# Patient Record
Sex: Female | Born: 1935
Health system: Southern US, Community
[De-identification: ages and names within clinical notes are randomized; demographics above are authoritative.]

## PROBLEM LIST (undated history)

## (undated) DIAGNOSIS — I480 Paroxysmal atrial fibrillation: Secondary | ICD-10-CM

## (undated) DIAGNOSIS — E7800 Pure hypercholesterolemia, unspecified: Secondary | ICD-10-CM

## (undated) DIAGNOSIS — IMO0002 Reserved for concepts with insufficient information to code with codable children: Secondary | ICD-10-CM

## (undated) DIAGNOSIS — I442 Atrioventricular block, complete: Secondary | ICD-10-CM

## (undated) DIAGNOSIS — N200 Calculus of kidney: Secondary | ICD-10-CM

## (undated) DIAGNOSIS — D649 Anemia, unspecified: Secondary | ICD-10-CM

## (undated) DIAGNOSIS — O2230 Deep phlebothrombosis in pregnancy, unspecified trimester: Secondary | ICD-10-CM

## (undated) DIAGNOSIS — E039 Hypothyroidism, unspecified: Secondary | ICD-10-CM

## (undated) DIAGNOSIS — I350 Nonrheumatic aortic (valve) stenosis: Secondary | ICD-10-CM

## (undated) DIAGNOSIS — M1990 Unspecified osteoarthritis, unspecified site: Secondary | ICD-10-CM

## (undated) DIAGNOSIS — K5790 Diverticulosis of intestine, part unspecified, without perforation or abscess without bleeding: Secondary | ICD-10-CM

## (undated) DIAGNOSIS — I5189 Other ill-defined heart diseases: Secondary | ICD-10-CM

## (undated) DIAGNOSIS — I10 Essential (primary) hypertension: Secondary | ICD-10-CM

## (undated) DIAGNOSIS — J449 Chronic obstructive pulmonary disease, unspecified: Secondary | ICD-10-CM

## (undated) DIAGNOSIS — J45909 Unspecified asthma, uncomplicated: Secondary | ICD-10-CM

## (undated) DIAGNOSIS — H3530 Unspecified macular degeneration: Secondary | ICD-10-CM

## (undated) HISTORY — DX: Atrioventricular block, complete: I44.2

## (undated) HISTORY — PX: HERNIA REPAIR: SHX51

## (undated) HISTORY — DX: Essential (primary) hypertension: I10

## (undated) HISTORY — DX: Anemia, unspecified: D64.9

## (undated) HISTORY — DX: Chronic obstructive pulmonary disease, unspecified: J44.9

## (undated) HISTORY — PX: BUNIONECTOMY WITH HAMMERTOE RECONSTRUCTION: SHX5600

## (undated) HISTORY — PX: JOINT REPLACEMENT: SHX530

## (undated) HISTORY — PX: APPENDECTOMY: SHX54

## (undated) HISTORY — DX: Pure hypercholesterolemia, unspecified: E78.00

## (undated) HISTORY — DX: Unspecified asthma, uncomplicated: J45.909

## (undated) HISTORY — DX: Deep phlebothrombosis in pregnancy, unspecified trimester: O22.30

## (undated) HISTORY — PX: CATARACT EXTRACTION W/ INTRAOCULAR LENS  IMPLANT, BILATERAL: SHX1307

## (undated) HISTORY — DX: Unspecified macular degeneration: H35.30

## (undated) HISTORY — PX: INCISIONAL HERNIA REPAIR: SHX193

## (undated) HISTORY — DX: Diverticulosis of intestine, part unspecified, without perforation or abscess without bleeding: K57.90

## (undated) HISTORY — PX: DILATION AND CURETTAGE OF UTERUS: SHX78

## (undated) HISTORY — DX: Unspecified osteoarthritis, unspecified site: M19.90

## (undated) HISTORY — DX: Calculus of kidney: N20.0

## (undated) HISTORY — DX: Paroxysmal atrial fibrillation: I48.0

---
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
## 1974-05-09 HISTORY — PX: PARTIAL NEPHRECTOMY: SHX414

## 1997-09-16 ENCOUNTER — Other Ambulatory Visit: Admission: RE | Admit: 1997-09-16 | Discharge: 1997-09-16 | Payer: Self-pay | Admitting: *Deleted

## 1998-01-06 ENCOUNTER — Encounter: Payer: Self-pay | Admitting: Emergency Medicine

## 1998-01-06 ENCOUNTER — Emergency Department (HOSPITAL_COMMUNITY): Admission: EM | Admit: 1998-01-06 | Discharge: 1998-01-06 | Payer: Self-pay | Admitting: Emergency Medicine

## 1999-01-17 ENCOUNTER — Other Ambulatory Visit: Admission: RE | Admit: 1999-01-17 | Discharge: 1999-01-17 | Payer: Self-pay | Admitting: *Deleted

## 1999-04-09 HISTORY — PX: TOTAL KNEE ARTHROPLASTY: SHX125

## 1999-06-27 HISTORY — PX: CARDIAC PACEMAKER PLACEMENT: SHX583

## 1999-07-30 ENCOUNTER — Ambulatory Visit (HOSPITAL_COMMUNITY): Admission: RE | Admit: 1999-07-30 | Discharge: 1999-07-31 | Payer: Self-pay | Admitting: Cardiology

## 1999-07-31 ENCOUNTER — Encounter: Payer: Self-pay | Admitting: Cardiology

## 2000-01-28 ENCOUNTER — Inpatient Hospital Stay (HOSPITAL_COMMUNITY): Admission: RE | Admit: 2000-01-28 | Discharge: 2000-02-02 | Payer: Self-pay | Admitting: Orthopedic Surgery

## 2000-03-14 ENCOUNTER — Other Ambulatory Visit: Admission: RE | Admit: 2000-03-14 | Discharge: 2000-03-14 | Payer: Self-pay | Admitting: *Deleted

## 2000-09-18 ENCOUNTER — Ambulatory Visit (HOSPITAL_COMMUNITY): Admission: RE | Admit: 2000-09-18 | Discharge: 2000-09-18 | Payer: Self-pay | Admitting: Gastroenterology

## 2001-03-19 ENCOUNTER — Other Ambulatory Visit: Admission: RE | Admit: 2001-03-19 | Discharge: 2001-03-19 | Payer: Self-pay | Admitting: *Deleted

## 2001-06-08 ENCOUNTER — Encounter: Payer: Self-pay | Admitting: Geriatric Medicine

## 2001-06-08 ENCOUNTER — Encounter: Admission: RE | Admit: 2001-06-08 | Discharge: 2001-06-08 | Payer: Self-pay | Admitting: Geriatric Medicine

## 2002-03-22 ENCOUNTER — Other Ambulatory Visit: Admission: RE | Admit: 2002-03-22 | Discharge: 2002-03-22 | Payer: Self-pay | Admitting: *Deleted

## 2003-06-07 ENCOUNTER — Observation Stay (HOSPITAL_COMMUNITY): Admission: RE | Admit: 2003-06-07 | Discharge: 2003-06-08 | Payer: Self-pay | Admitting: General Surgery

## 2003-10-03 ENCOUNTER — Encounter: Admission: RE | Admit: 2003-10-03 | Discharge: 2003-10-03 | Payer: Self-pay | Admitting: Geriatric Medicine

## 2004-04-24 ENCOUNTER — Other Ambulatory Visit: Admission: RE | Admit: 2004-04-24 | Discharge: 2004-04-24 | Payer: Self-pay | Admitting: *Deleted

## 2005-08-13 ENCOUNTER — Other Ambulatory Visit: Admission: RE | Admit: 2005-08-13 | Discharge: 2005-08-13 | Payer: Self-pay | Admitting: Obstetrics and Gynecology

## 2006-05-31 ENCOUNTER — Emergency Department (HOSPITAL_COMMUNITY): Admission: EM | Admit: 2006-05-31 | Discharge: 2006-05-31 | Payer: Self-pay | Admitting: Emergency Medicine

## 2006-10-17 ENCOUNTER — Ambulatory Visit: Payer: Self-pay | Admitting: Internal Medicine

## 2006-10-17 LAB — CONVERTED CEMR LAB
Basophils Relative: 0 % (ref 0.0–1.0)
Eosinophils Relative: 3.6 % (ref 0.0–5.0)
Lymphocytes Relative: 20.6 % (ref 12.0–46.0)
Monocytes Relative: 8.8 % (ref 3.0–11.0)
Platelets: 326 10*3/uL (ref 150–400)
RBC: 4.15 M/uL (ref 3.87–5.11)
RDW: 12.3 % (ref 11.5–14.6)
WBC: 5.6 10*3/uL (ref 4.5–10.5)

## 2006-10-20 ENCOUNTER — Ambulatory Visit: Payer: Self-pay | Admitting: Cardiovascular Disease

## 2006-11-05 ENCOUNTER — Ambulatory Visit: Payer: Self-pay | Admitting: Internal Medicine

## 2006-11-24 ENCOUNTER — Ambulatory Visit: Payer: Self-pay | Admitting: Internal Medicine

## 2007-01-29 ENCOUNTER — Ambulatory Visit: Payer: Self-pay | Admitting: Internal Medicine

## 2007-02-03 ENCOUNTER — Ambulatory Visit (HOSPITAL_COMMUNITY): Admission: RE | Admit: 2007-02-03 | Discharge: 2007-02-03 | Payer: Self-pay | Admitting: Internal Medicine

## 2007-02-03 ENCOUNTER — Ambulatory Visit: Admission: RE | Admit: 2007-02-03 | Discharge: 2007-02-03 | Payer: Self-pay | Admitting: Internal Medicine

## 2007-03-23 ENCOUNTER — Telehealth: Payer: Self-pay | Admitting: Internal Medicine

## 2007-03-30 ENCOUNTER — Telehealth (INDEPENDENT_AMBULATORY_CARE_PROVIDER_SITE_OTHER): Payer: Self-pay | Admitting: *Deleted

## 2007-04-06 ENCOUNTER — Ambulatory Visit: Payer: Self-pay | Admitting: Internal Medicine

## 2007-04-06 ENCOUNTER — Telehealth (INDEPENDENT_AMBULATORY_CARE_PROVIDER_SITE_OTHER): Payer: Self-pay | Admitting: *Deleted

## 2007-04-06 DIAGNOSIS — Z9089 Acquired absence of other organs: Secondary | ICD-10-CM

## 2007-04-06 DIAGNOSIS — J441 Chronic obstructive pulmonary disease with (acute) exacerbation: Secondary | ICD-10-CM | POA: Insufficient documentation

## 2007-06-07 HISTORY — PX: INSERT / REPLACE / REMOVE PACEMAKER: SUR710

## 2007-06-11 ENCOUNTER — Inpatient Hospital Stay (HOSPITAL_COMMUNITY): Admission: EM | Admit: 2007-06-11 | Discharge: 2007-06-12 | Payer: Self-pay | Admitting: Emergency Medicine

## 2007-06-11 ENCOUNTER — Ambulatory Visit: Payer: Self-pay | Admitting: *Deleted

## 2007-06-22 ENCOUNTER — Encounter: Admission: RE | Admit: 2007-06-22 | Discharge: 2007-06-22 | Payer: Self-pay | Admitting: Cardiology

## 2007-06-24 ENCOUNTER — Ambulatory Visit (HOSPITAL_COMMUNITY): Admission: RE | Admit: 2007-06-24 | Discharge: 2007-06-24 | Payer: Self-pay | Admitting: Cardiology

## 2007-07-30 ENCOUNTER — Ambulatory Visit: Payer: Self-pay | Admitting: Internal Medicine

## 2007-10-29 ENCOUNTER — Ambulatory Visit: Payer: Self-pay | Admitting: Internal Medicine

## 2007-10-29 DIAGNOSIS — K219 Gastro-esophageal reflux disease without esophagitis: Secondary | ICD-10-CM

## 2008-04-19 ENCOUNTER — Ambulatory Visit: Payer: Self-pay | Admitting: Internal Medicine

## 2008-04-19 DIAGNOSIS — I4821 Permanent atrial fibrillation: Secondary | ICD-10-CM

## 2008-04-19 DIAGNOSIS — I10 Essential (primary) hypertension: Secondary | ICD-10-CM

## 2008-06-24 ENCOUNTER — Telehealth (INDEPENDENT_AMBULATORY_CARE_PROVIDER_SITE_OTHER): Payer: Self-pay | Admitting: *Deleted

## 2008-10-27 ENCOUNTER — Ambulatory Visit: Payer: Self-pay | Admitting: Internal Medicine

## 2008-10-27 DIAGNOSIS — J3089 Other allergic rhinitis: Secondary | ICD-10-CM

## 2008-10-27 DIAGNOSIS — J302 Other seasonal allergic rhinitis: Secondary | ICD-10-CM

## 2008-12-08 ENCOUNTER — Ambulatory Visit: Payer: Self-pay | Admitting: Internal Medicine

## 2008-12-30 ENCOUNTER — Ambulatory Visit: Payer: Self-pay | Admitting: Internal Medicine

## 2009-01-16 ENCOUNTER — Ambulatory Visit: Payer: Self-pay | Admitting: Internal Medicine

## 2009-03-17 ENCOUNTER — Ambulatory Visit: Payer: Self-pay | Admitting: Internal Medicine

## 2010-01-23 ENCOUNTER — Ambulatory Visit: Payer: Self-pay | Admitting: Internal Medicine

## 2010-04-26 ENCOUNTER — Ambulatory Visit
Admission: RE | Admit: 2010-04-26 | Discharge: 2010-04-26 | Payer: Self-pay | Source: Home / Self Care | Attending: Internal Medicine | Admitting: Internal Medicine

## 2010-04-26 DIAGNOSIS — J449 Chronic obstructive pulmonary disease, unspecified: Secondary | ICD-10-CM | POA: Insufficient documentation

## 2010-04-29 ENCOUNTER — Encounter: Payer: Self-pay | Admitting: Geriatric Medicine

## 2010-05-08 NOTE — Assessment & Plan Note (Signed)
Summary: ROV/ MBW   Primary Provider/Referring Provider:  Merlene Laughter  CC:  Follow up visit-Bronchitis/Allergic Rhinitis; needs refills for Singulair and Nexium(monthly)..  History of Present Illness: January 16, 2009- Chronic bronchitis She admits frequent husky voice and head congestion. Continues Neti pot with some mucus but little headache or purulent discharge.  PFT- Milld obstructive airways disease with reponse to bronchodilator. Normal DLCO. Had tight chest- cards did ECHO 2 weeks ago and raised question of cath later.  March 17, 2009- Chronic bronchitis, allergic rhinitis Has been feeling much better in the last month, compared with the heat of summer. Cardiology reduced some meds, which may also have helped. She has mild glaucoma and chose not to try the nasal spray sample I gave last time. She notes postnasal drip and uses Neti pot regularly.  She likes Xopenex and uses Qvar daily. some cough and wheeze, but mcuh less than this summer. Had flu vax.  January 23, 2010- Chronic bronchitis, allergic rhinitis, PAFib/ pacemaker Follow up visit-Bronchitis/Allergic Rhinitis; needs refills for Singulair and Nexium(monthly). More aware of exertional dyspnea climbing stairs. Morning cough is productive, cleared with inhalers. wheezes a little sometimes. Uses Proair and Qvar daily. Didn't try Xopenex. Walks regularly. Heart pounds with exertion. Has pacemaker and Tambocor/ Dr Amil Amen.   PFT 12/2008- Mild obstruction, normal DLCO., mild response to bronchodilator.  No hx anemia. Spiriva aggravated glaucoma.   Preventive Screening-Counseling & Management  Alcohol-Tobacco     Smoking Status: never  Current Medications (verified): 1)  Aceon 8 Mg  Tabs (Perindopril Erbumine) .[name].. 1 Tabs Daily 2)  Furosemide 40 Mg  Tabs (Furosemide) .... 1/2 To 1 Daily 3)  Tambocor 100 Mg Tabs (Flecainide Acetate) .... Take 1 By Mouth Two Times A Day 4)  Bayer Low Strength 81 Mg  Tbec (Aspirin) ....  Take 1 Tablet By Mouth Once A Day 5)  Calcium 600/vitamin D 600-400 Mg-Unit Tabs (Calcium Carbonate-Vitamin D) .... Take 1 Tablet By Mouth Two Times A Day 6)  Nexium 40 Mg  Cpdr (Esomeprazole Magnesium) .... Take 1 Tablet By Mouth Once A Day 7)  Qvar 80 Mcg/act  Aers (Beclomethasone Dipropionate) .... Use As Needed,  Rinse Mouth Well After Use 8)  Proair Hfa 108 (90 Base) Mcg/act  Aers (Albuterol Sulfate) .... 2 Puffs 4 X Daily As Needed 9)  Benzonatate 100 Mg  Caps (Benzonatate) .[name].. 1 Four Times A Day As Needed For Cough 10)  Singulair 10 Mg Tabs (Montelukast Sodium) .[name].. 1 Daily 11)  Xopenex Hfa 45 Mcg/act Aero (Levalbuterol Tartrate) .... 2 Puffs Four Times A Day As Needed Rescue 12)  Avapro 300 Mg  Tabs (Irbesartan) .... Take 1 By Mouth Once Daily 13)  Multivitamins   Tabs (Multiple Vitamin) .... Take 1 Tablet By Mouth Once A Day 14)  Lutein 20 Mg  Caps (Lutein) .... Take 1 By Mouth Once Daily 15)  Preservision Areds   Caps (Multiple Vitamins-Minerals) .... Take 2 By Mouth Once Daily 16)  Cardizem 120 Mg Tabs (Diltiazem Hcl) .[name].. 1 Once Daily 17)  Vitamin D 2000 Unit Tabs (Cholecalciferol) .... Take 1 Tablet By Mouth Once A Day  Allergies (verified): 1)  ! Penicillin 2)  ! * Combigan  Past History:  Past Medical History: Last updated: 01/16/2009 GERD (ICD-530.81) * PARTIAL NEPHRECTOMY 1976- stone disease Hx of APPENDECTOMY, HX OF (ICD-V45.79) BRONCHITIS, CHRONIC, ACUTE EXACERBATION (ICD-491.21) * PACEMAKER Rhinitis  Past Surgical History: Last updated: 04/19/2008 Pacemakerpartial left nephrectomy for staghorn calculus incisional hernia repair Total Knee Arthroplasty  Appendectomy foot surgery  Family History: Last updated: Nov 09, 2008 Father- died malignant hypertension Mother- died breast cancer  Social History: Last updated: 04/06/2007 married retired Patient never smoked.   Risk Factors: Smoking Status: never (01/23/2010)  Review of Systems      See HPI        The patient complains of shortness of breath with activity, productive cough, and irregular heartbeats.  The patient denies shortness of breath at rest, non-productive cough, coughing up blood, chest pain, acid heartburn, indigestion, loss of appetite, weight change, abdominal pain, difficulty swallowing, sore throat, tooth/dental problems, headaches, nasal congestion/difficulty breathing through nose, and sneezing.    Vital Signs:  Patient profile:   77 year old female Height:      65 inches Weight:      136.50 pounds BMI:     22.80 O2 Sat:      96 % on Room air Pulse rate:   73 / minute BP sitting:   132 / 72  (left arm) Cuff size:   regular  Vitals Entered By: Reynaldo Minium CMA (January 23, 2010 10:53 AM)  O2 Flow:  Room air CC: Follow up visit-Bronchitis/Allergic Rhinitis; needs refills for Singulair and Nexium(monthly).   Physical Exam  Additional Exam:  General: A/Ox3; pleasant and cooperative, NAD, slender SKIN: no rash, lesions NODES: no lymphadenopathy HEENT: [place]/AT, EOM- WNL, Conjuctivae- clear, PERRLA, TM-WNL, Nose- turbinate edema without discharge,  Throat- clear and wnl, Mallampati  II NECK: Supple w/ fair ROM, JVD- none, normal carotid impulses w/o bruits Thyroid-  CHEST: Clear to P&A, dry cough once HEART: RRR, 1/6 systolic murmur AS ABDOMEN: Soft and nl;  ZOX:WRUE, nl pulses, no edema  NEURO: Grossly intact to observation      Impression & Recommendations:  Problem # 1:  BRONCHITIS, CHRONIC (ICD-491.9)  She has been out of Singulair and is refilling it. We don't know what difference that makes. We are going to explore the benefit/ side effect issues of changing Qvar plus Proair to a LABA/ ICS Symbicort. Needs flu vax CXR update  Problem # 2:  ALLERGIC RHINITIS (ICD-477.9)  Nasacort helped and will be refilled with fluticasone for cost saving.   Her updated medication list for this problem includes:    Fluticasone Propionate 50 Mcg/act Susp  (Fluticasone propionate) .[name]... 1-2 sprays each nostril once daily  Medications Added to Medication List This Visit: 1)  Symbicort 160-4.5 Mcg/act Aero (Budesonide-formoterol fumarate) .... 2 puffs and rinse mouth, twice daily 2)  Fluticasone Propionate 50 Mcg/act Susp (Fluticasone propionate) .[name].. 1-2 sprays each nostril once daily  Other Orders: Est. Patient Level IV (99214) T-2 View CXR (71020TC)  Patient Instructions: 1)  Please schedule a follow-up appointment in 3 months. 2)  Sample/ script Symbicort 160/4.5:  3)  2 puffs and rinse twice daily 4)  Try this instead of regular use of Qvar and proair. 5)  Use either Proair or Xopenex as a rescue inhaler, 2 puffs four times a day as needed. Proair is cheaper and we will stick to this unless you need the lower cardiac stimulation with Xopenex.  6)  Flu vax 7)  A chest x-ray has been recommended.  Your imaging study may require preauthorization.  8)  Script fluticasone for steroid nasal spray Prescriptions: FLUTICASONE PROPIONATE 50 MCG/ACT SUSP (FLUTICASONE PROPIONATE) 1-2 sprays each nostril once daily  #1 x prn   Entered and Authorized by:   Waymon Budge MD   Signed by:   Waymon Budge MD on 01/23/2010  Method used:   Print then Give to Patient   RxID:   [id number] SYMBICORT 160-4.5 MCG/ACT AERO (BUDESONIDE-FORMOTEROL FUMARATE) 2 puffs and rinse mouth, twice daily  #1 x prn   Entered and Authorized by:   Waymon Budge MD   Signed by:   Waymon Budge MD on 01/23/2010   Method used:   Print then Give to Patient   RxID:   [id number]

## 2010-05-10 NOTE — Assessment & Plan Note (Signed)
Summary: 3 MONTHS/ MBW   Primary Provider/Referring Provider:  Merlene Laughter  CC:  3 month follow up visit-bronchitis and allergic rhinitis.Jody Blake  History of Present Illness: March 17, 2009- Chronic bronchitis, allergic rhinitis Has been feeling much better in the last month, compared with the heat of summer. Cardiology reduced some meds, which may also have helped. She has mild glaucoma and chose not to try the nasal spray sample I gave last time. She notes postnasal drip and uses Neti pot regularly.  She likes Xopenex and uses Qvar daily. some cough and wheeze, but mcuh less than this summer. Had flu vax.  January 23, 2010- Chronic bronchitis, allergic rhinitis, PAFib/ pacemaker Follow up visit-Bronchitis/Allergic Rhinitis; needs refills for Singulair and Nexium(monthly). More aware of exertional dyspnea climbing stairs. Morning cough is productive, cleared with inhalers. wheezes a little sometimes. Uses Proair and Qvar daily. Didn't try Xopenex. Walks regularly. Heart pounds with exertion. Has pacemaker and Tambocor/ Dr Amil Amen.   PFT 12/2008- Mild obstruction, normal DLCO., mild response to bronchodilator.  No hx anemia. Spiriva aggravated glaucoma.   April 26, 2010- COPD, allergic rhinitis, PAFib/ pacemaker Nurse-CC: 3 month follow up visit-bronchitis and allergic rhinitis. CXR- 01/23/10- CE, pacemaker, emphysema Doing ok through the winter. Uses her inhalers. Definite DOE especially stairs  PFT frpm 12/26/08 reviewed in detail again with her, explaining component definitions of obstructive airways diseases. Still tries to exercise regularly.  Had pneumovax twice.       Preventive Screening-Counseling & Management  Alcohol-Tobacco     Smoking Status: never  Current Medications (verified): 1)  Aceon 8 Mg  Tabs (Perindopril Erbumine) .Jody Blake.. 1 Tabs Daily 2)  Furosemide 40 Mg  Tabs (Furosemide) .... 1/2 To 1 Daily 3)  Tambocor 150 Mg Tabs (Flecainide Acetate) .... Take 1 By Mouth  Once Daily 4)  Bayer Low Strength 81 Mg  Tbec (Aspirin) .... Take 1 Tablet By Mouth Once A Day 5)  Calcium 600/vitamin D 600-400 Mg-Unit Tabs (Calcium Carbonate-Vitamin D) .... Take 1 Tablet By Mouth Two Times A Day 6)  Nexium 40 Mg  Cpdr (Esomeprazole Magnesium) .... Take 1 Tablet By Mouth Once A Day 7)  Symbicort 160-4.5 Mcg/act Aero (Budesonide-Formoterol Fumarate) .... 2 Puffs and Rinse Mouth, Twice Daily 8)  Benzonatate 100 Mg  Caps (Benzonatate) .Jody Blake.. 1 Four Times A Day As Needed For Cough 9)  Singulair 10 Mg Tabs (Montelukast Sodium) .Jody Blake.. 1 Daily 10)  Xopenex Hfa 45 Mcg/act Aero (Levalbuterol Tartrate) .... 2 Puffs Four Times A Day As Needed Rescue 11)  Avapro 300 Mg  Tabs (Irbesartan) .... Take 1 By Mouth Once Daily 12)  Multivitamins   Tabs (Multiple Vitamin) .... Take 1 Tablet By Mouth Once A Day 13)  Lutein 20 Mg  Caps (Lutein) .... Take 1 By Mouth Once Daily 14)  Preservision Areds   Caps (Multiple Vitamins-Minerals) .... Take 2 By Mouth Once Daily 15)  Cardizem 120 Mg Tabs (Diltiazem Hcl) .Jody Blake.. 1 Once Daily 16)  Vitamin D 2000 Unit Tabs (Cholecalciferol) .... Take 1 Tablet By Mouth Once A Day 17)  Fluticasone Propionate 50 Mcg/act Susp (Fluticasone Propionate) .Jody Blake.. 1-2 Sprays Each Nostril Once Daily  Allergies (verified): 1)  ! Penicillin 2)  ! * Combigan  Past History:  Past Medical History: Last updated: 01/16/2009 GERD (ICD-530.81) * PARTIAL NEPHRECTOMY 1976- stone disease Hx of APPENDECTOMY, HX OF (ICD-V45.79) BRONCHITIS, CHRONIC, ACUTE EXACERBATION (ICD-491.21) * PACEMAKER Rhinitis  Past Surgical History: Last updated: 04/19/2008 Pacemakerpartial left nephrectomy for staghorn calculus incisional hernia repair  Total Knee Arthroplasty Appendectomy foot surgery  Family History: Last updated: 2008-11-14 Father- died malignant hypertension Mother- died breast cancer  Social History: Last updated: 04/06/2007 married retired Patient never smoked.   Risk  Factors: Smoking Status: never (04/26/2010)  Review of Systems      See HPI       The patient complains of shortness of breath with activity.  The patient denies shortness of breath at rest, productive cough, non-productive cough, coughing up blood, chest pain, irregular heartbeats, acid heartburn, indigestion, loss of appetite, weight change, abdominal pain, difficulty swallowing, sore throat, tooth/dental problems, headaches, nasal congestion/difficulty breathing through nose, and sneezing.    Vital Signs:  Patient profile:   77 year old female Height:      65 inches Weight:      140 pounds BMI:     23.38 O2 Sat:      97 % on Room air Pulse rate:   67 / minute BP sitting:   122 / 82  (left arm) Cuff size:   regular  Vitals Entered By: Reynaldo Minium CMA (April 26, 2010 2:25 PM)  O2 Flow:  Room air CC: 3 month follow up visit-bronchitis and allergic rhinitis.   Physical Exam  Additional Exam:  General: A/Ox3; pleasant and cooperative, NAD, slender SKIN: no rash, lesions NODES: no lymphadenopathy HEENT: [place]/AT, EOM- WNL, Conjuctivae- clear, PERRLA, TM-WNL, Nose- turbinate edema without discharge,  Throat- clear and wnl, Mallampati  II NECK: Supple w/ fair ROM, JVD- none, normal carotid impulses w/o bruits Thyroid-  CHEST: Clear to P&A, dry cough once HEART: RRR, 1/6 systolic murmur AS ABDOMEN: Soft and nl;  EAV:WUJW, nl pulses, no edema  NEURO: Grossly intact to observation      CXR  Procedure date:  01/23/2010  Findings:      DG CHEST 2 VIEW - [id number]   Clinical Data: Hypertension.  Chronic bronchitis.   CHEST - 2 VIEW   Comparison: 06/11/2007   Findings: Dual lead pacer noted.   Attenuated peripheral pulmonary vasculature is compatible with emphysema/COPD.  There is mild tortuosity of the thoracic aorta.   Borderline cardiomegaly noted.   IMPRESSION:   1.  Borderline cardiomegaly with dual lead pacer in place. 2.  Emphysema.   Read By:   Dellia Cloud,  M.D.     Released By:  Dellia Cloud,  M.D.   Impression & Recommendations:  Problem # 1:  COPD (ICD-496) Moderate COPD now stable. She is up to date on pneumovax. We avoid Spiriva due to her borderline glaucoma.   Problem # 2:  ALLERGIC RHINITIS (ICD-477.9)  Nasal congestion is minimal now but we will be watching as Spring seasonal pollens come.  Her updated medication list for this problem includes:    Fluticasone Propionate 50 Mcg/act Susp (Fluticasone propionate) .Jody Blake... 1-2 sprays each nostril once daily  Medications Added to Medication List This Visit: 1)  Tambocor 150 Mg Tabs (Flecainide acetate) .... Take 1 by mouth once daily  Other Orders: Est. Patient Level III (95621)  Patient Instructions: 1)  Please schedule a follow-up appointment in 6 months. 2)  Refils sent for Nexium and Singulair Prescriptions: SINGULAIR 10 MG TABS (MONTELUKAST SODIUM) 1 daily  #30 Tablet x prn   Entered and Authorized by:   Waymon Budge MD   Signed by:   Waymon Budge MD on 04/26/2010   Method used:   Electronically to        [facility] [address] [phone]* (  retail)       [address]       [place], [place]  [address]       Ph: [phone] or [phone]       Fax: [phone]   RxID:   [id number] NEXIUM 40 MG  CPDR (ESOMEPRAZOLE MAGNESIUM) Take 1 tablet by mouth once a day  #30 Capsule x prn   Entered and Authorized by:   Waymon Budge MD   Signed by:   Waymon Budge MD on 04/26/2010   Method used:   [facility] to        [facility] [address] [phone]* (retail)       [address]       [place], [place]  [address]       Ph: [phone] or [phone]       Fax: [phone]   RxID:   [id number]    Immunization History:  Influenza Immunization History:    Influenza:  historical (01/23/2010)     CXR  Procedure date:  01/23/2010  Findings:      DG CHEST 2 VIEW - [id number]     Clinical Data: Hypertension.  Chronic bronchitis.   CHEST - 2 VIEW   Comparison: 06/11/2007   Findings: Dual lead pacer noted.   Attenuated peripheral pulmonary vasculature is compatible with emphysema/COPD.  There is mild tortuosity of the thoracic aorta.   Borderline cardiomegaly noted.   IMPRESSION:   1.  Borderline cardiomegaly with dual lead pacer in place. 2.  Emphysema.   Read By:  Dellia Cloud,  M.D.     Released By:  Dellia Cloud,  Judie Petit.D.

## 2010-08-21 NOTE — H&P (Signed)
NAMECAMARYN, LUMBERT NO.:  [id number]   MEDICAL RECORD NO.:  [id number]          PATIENT TYPE:  INP   LOCATION:  2626                         FACILITY:  [facility]   PHYSICIAN:  Rod Holler, MD     DATE OF BIRTH:  1926-02-19   DATE OF ADMISSION:  06/11/2007  DATE OF DISCHARGE:                              HISTORY & PHYSICAL   PRIMARY CARDIOLOGIST:  Francisca December, M.D.   CHIEF COMPLAINTS:  Palpitations.   HISTORY OF PRESENT ILLNESS:  Ms. Cheever is a very pleasant 77 year old  female with a history of atrial fibrillation, who takes flecainide, who  presented to the emergency department due to palpitations and fast heart  rate.  Since Saturday, the patient has not felt well.  She has taken her  blood pressure at home on multiple occasions and states that it was  running low.  She has had a couple episodes of presyncope and  generalized fatigue.  Tonight, the patient had the onset of palpitations  and a fast heart rate, associated with mild chest tightness.  She also  had mild shortness of breath.  She has had no recent PND or orthopnea,  no increased lower extremity swelling, and no syncope.  In the emergency  department, the patient was given both oral and IV calcium channel  blocker.  Currently, the patient has no complaints of chest pain, but  still has other complaints of palpitations and a fast heart rate.   PAST MEDICAL HISTORY:  1. Hypertension.  2. Atrial fibrillation.  3. Nephrolithiasis.  4. Hyperlipidemia.  5. Status post pacemaker placement.   MEDICATIONS:  1. Flecainide 100 mg p.o. b.i.d.  2. Lasix 20 mg p.o. daily.  3. Cardizem 120 mg p.o. daily.  4. Aceon 12 mg p.o. daily.  5. Aspirin.  6. Avapro 30 mg p.o. daily.  7. Lipitor 10 mg p.o. daily.  8. Prometrium 100 mg p.o. daily.  9. Nexium 40 mg p.o. daily.  10.Spiriva daily.  11.Vivelle 0.0375 mg p.o. twice weekly.   ALLERGIES:  PENICILLIN.   SOCIAL HISTORY:  The patient is married.   She is a nonsmoker.   FAMILY HISTORY:  Coronary artery disease.   REVIEW OF SYSTEMS:  All systems were reviewed in detail and are negative  except as noted in the history of present illness.   PHYSICAL EXAMINATION:  VITAL SIGNS: Blood pressure 157/97, respiratory  rate 18, oxygen saturation 98%, heart rate in the 120s-150s.  GENERAL:  Well-developed, well-nourished female, appears younger than  stated age, alert and oriented x3, in no apparent distress.  HEENT: Atraumatic, normocephalic.  Pupils equal, round, and reactive to  light.  Extraocular movements intact.  Oropharynx clear.  NECK:  Supple.  No adenopathy, no JVD, no carotid bruits.  CHEST:  Lungs clear to auscultation bilaterally, with equal bilateral  breath sounds.  CORONARY:  Irregularly irregular, tachycardia, 1/6 systolic ejection  murmur.  ABDOMEN:  Soft, nontender, nondistended.  Active bowel sounds.  EXTREMITIES:  Trace lower extremity edema, no clubbing or cyanosis.  NEUROLOGIC:  No focal deficits.   EKG shows  atrial fibrillation, RVR, inferolateral ST-segment depression,  T-wave inversion, questionable old anterior infarct, left axis  deviation.   LABORATORIES:  Troponin less than 0.05, myoglobin 33, CK-MB 1.9.  Sodium  136, potassium 3.7, chloride 104, bicarbonate 26, BUN 19, creatinine  1.3.  White blood cell count 7.2, hematocrit 39.7, platelet count 284.   IMPRESSION AND PLAN:  Ms. Skyles is a very pleasant 77 year old female  who presents with atrial fibrillation with a rapid ventricular response.   PLAN:  1. Cardiovascular.  Admit the patient to a step-down bed, Cardizem      drip titrated to a heart rate less than 80, heparin bolus and drip,      aspirin daily, Lipitor daily, oral Cardizem at 240 mg p.o. daily,      Lasix at home dose, Avapro at home dose, serial cardiac enzymes,      daily EKG.  2. Fluid, electrolytes, and nutrition.  CMP and magnesium in the      morning, n.p.o.  3. Endocrine.   Thyroid function tests.  4. Guaiac all stools.      Rod Holler, MD  Electronically Signed     TRK/MEDQ  D:  06/11/2007  T:  06/11/2007  Job:  [id number]

## 2010-08-21 NOTE — Assessment & Plan Note (Signed)
[name]                             PULMONARY OFFICE NOTE   NAME:Jody Blake, Jody Blake                       MRN:          [id number]  DATE:01/29/2007                            DOB:          [birth year]    DATE OF VISIT:  January 29, 2007.   PROBLEM LIST:  1. Chronic bronchitis.  2. Cough.   HISTORY:  Less cough and it has been less productive, Spiriva does help.  Daily Nexium.  Sensitive to stimulants including decongestants.  In  discussing her cough, she does get a sense that food hangs in her  throat, that there is often some foreign material lingering in her  throat, and we discussed reflux and aspiration issues.  She is  interested in having a swallow evaluation.   MEDICATIONS:  Her medicine list was reviewed with no changes except that  Lipitor has been discontinued.  She is using Spiriva.  PENICILLIN  INTOLERANT.   OBJECTIVE:  VITAL SIGNS:  Weight 137 pounds, BP 124/70, pulse 108, room  air saturation 98%.  HEENT:  The pharynx is not red, voice quality is normal, there is no  stridor, and no visible postnasal drainage.  CHEST:  Clear, she is not coughing.   IMPRESSION:  1. Pulmonary function tests in July had shown mild obstructive airways      disease but with evidence of air trapping suggesting that she may      have more obstructive disease than the flows indicate.  FEV1 was      70% of predicted with FEV1-FVC 0.64.  Diffusion was normal.  2. Cough, which may include a component of bronchitis but also of      laryngopharyngeal reflux (LPR).   PLAN:  1. Modified barium swallow with speech therapist.  2. Flu vaccine discussed and given.  3. Reflux precautions.  4. Schedule a return in six months, earlier p.r.n.     Clinton D. Maple Hudson, MD, Tonny Bollman, FACP  Electronically Signed   CDY/MedQ  DD: 01/30/2007  DT: 01/30/2007  Job #: [id number]   cc:   Hal T. Stoneking, M.D.

## 2010-08-21 NOTE — Op Note (Signed)
NAMESUNYA, Jody Blake                ACCOUNT NO.:  [id number]   MEDICAL RECORD NO.:  [id number]          PATIENT TYPE:  OIB   LOCATION:  2852                         FACILITY:  [facility]   PHYSICIAN:  Francisca December, M.D.  DATE OF BIRTH:  05/17/1925   DATE OF PROCEDURE:  06/24/2007  DATE OF DISCHARGE:                               OPERATIVE REPORT   PROCEDURES PERFORMED:  1. Explant old pacing generator.  2. Implant new dual-chamber permanent transvenous pacemaker.  3. Threshold testing chronic leads.   INDICATION:  Jody Blake is a 77 year old woman with tachybrady  syndrome and paroxysmal atrial fibrillation.  She underwent a pacemaker  insertion on July 30, 1999, for tachybrady syndrome.  This was a  Medtronic Kappa 700.  It has now reached elective replacement interval.  She is, therefore, brought to the catheterization laboratory for  pacemaker battery change.   PROCEDURAL NOTE:  The patient is brought to the cardiac catheterization  laboratory in the fasting state.  The left prepectoral region was  prepped and draped in the usual sterile fashion.  Local anesthesia was  obtained with the infiltration of 1% lidocaine with epinephrine  throughout the left prepectoral region over the area of the old pacing  generator.   A 6-7 cm incision was then made over the prepacemaker insertion site,  and this was carried down by  sharp-and-blunt dissection to the  pacemaker capsule.  There, the capsule was incised, and the pacemaker  was delivered without difficulty.  The leads were detached from the old  pacing generator.  Each lead was tested for adequate pacing parameters,  and this is reported below.  The pocket was then copiously irrigated  using 1% kanamycin solution.  The new pacing generator was attached to  the leads, carefully identifying each lead by serial number, and placing  each into the appropriate receptacle.  Each lead was tightened into  place and tested for security.   The pacing generator was then placed in  the old fibers pocket.   The wound was closed using 2-0 Vicryl in a running fashion in the  subcutaneous layer.  The skin was approximated using 4-0 Vicryl in a  running subcuticular fashion.  Steri-Strips and a sterile dressing were  applied, and the patient was transported to the day cath center in  stable condition, A-sense, V-sense mode.   EQUIPMENT DATA:  The old pacing generator is a Medtronic Kappa 700,  serial number [id number], date of implant July 30, 1999.  The new  pacing generator is a Medtronic Enrhythm,  model number [id number]-[id number],  serial number [id number].   PACING DATA:  The atrial lead detected a 4.1 mV P wave.  The pacing  threshold was 0.8 V at 0.5 msec pulse width.  The impedance was 449 ohms  resulting in a current at capture threshold of 1.7 MA.  The ventricular  lead  detected in an 8.9 mV R wave.  The ventricular rate was approximately 45-  50 beats per minute.  The pacing threshold was 1.3 V at 0.5 msec pulse  width.  The impedance was  504 ohms resulting in a current at capture  threshold of 3.0 MA.      Francisca December, M.D.  Electronically Signed     JHE/MEDQ  D:  06/24/2007  T:  06/24/2007  Job:  [id number]

## 2010-08-21 NOTE — Discharge Summary (Signed)
NAMESTEPHAN, DRAUGHN                ACCOUNT NO.:  [id number]   MEDICAL RECORD NO.:  [id number]          PATIENT TYPE:  INP   LOCATION:  2626                         FACILITY:  [facility]   PHYSICIAN:  Francisca December, M.D.  DATE OF BIRTH:  February 29, 1926   DATE OF ADMISSION:  06/11/2007  DATE OF DISCHARGE:  06/12/2007                               DISCHARGE SUMMARY   DISCHARGE DIAGNOSES:  1. Recurrent atrial fibrillation with rapid ventricular response with      no further episodes during hospitalization, remains paced.  2. Tachycardia-Bradycardia syndrome status post remote permanent      pacemaker.  3. Chest pain, resolved, enzymes are negative, felt to be secondary to      number 1.  4. Hypertension.  5. Long term medication use.   HOSPITAL COURSE:  Ms. Brazie is a 77 year old female who has a history  of atrial fibrillation and is on flecainide.  Several days before this  admission she was not feeling right and her blood pressure at home had  been running low.  She had one episode of presyncope but on the day of  admission she began also to palpitate.  She was brought to the emergency  room, found to be in A-fib with RVR.  She was maintained initially on a  Cardizem drip and then switched over to oral Cardizem ultimately.  She  then required an increase in her flecainide to 150 mg twice a day and  this seemed to work as far as her rhythm standpoint.   Her troponin increased to 0.08 and we did a Cardiolite on this lady and  it was negative for ischemia.   On June 12, 2007, she was felt to be ready for discharge home.   DISCHARGE LABS:  Included prothrombin time 14.4, INR 1.1, hemoglobin  12.4 and hematocrit 36.5.   She was discharged home in stable and improved condition on the  following medications:  1. Coumadin 5 mg a day.  2. Baby Aspirin 81 mg a day.  3. Flecainide 150 mg a day.  4. Lasix 20 mg a day.  5. Cardizem 240 mg a day.  6. Aceon one a day.  7. Avapro 300 mg a  day.  8. Lipitor 10 mg a day.  9. Progesterone __________ daily.  10.Nexium one daily.  11.Spiriva daily.   Call for any questions or concerns.  Remain on a low-fat, Coumadin diet.  Increase activity slowly.  Follow up in the [facility] on June 15, 2007, at 4:15 p.m. and then follow up with Dr. Deitra Mayo, NP  on June 26, 2007, at 10:15 a.m.      Guy Franco, P.A.      Francisca December, M.D.  Electronically Signed    LB/MEDQ  D:  06/12/2007  T:  06/12/2007  Job:  [id number]   cc:   Francisca December, M.D.

## 2010-08-21 NOTE — Assessment & Plan Note (Signed)
[facility]                             PULMONARY OFFICE NOTE   NAME:Jody Blake, Jody Blake                       MRN:          [id number]  DATE:10/17/2006                            DOB:          [birth year]    PROBLEM:  A 77 year old woman referred through the courtesy of Dr.  Pete Glatter in pulmonary consultation concerned about cough.   HISTORY:  She says she has been troubled by a bothersome cough for a  long time.  She got sick in January visiting [place] and describes  what sounds like an acute bronchitis then.  She had been on allergy  vaccine for 4 years at one point with Dr. Valrie Hart for the same  symptoms and says she did not realize how well she was doing on allergy  shots until she quit and symptoms flared.  She has had 4 episodes of  acute bronchitis since January, marked by increased cough and phlegm.  Mucus is always thick and clear or trace yellow.  She is not aware of  any symptoms related to nose or sinuses.  While in [place], she was  treated with prednisone and a Z-Pak.  She says swallowing studies were  done, okay, and she denies ever having significant heartburn symptoms,  but she did get a therapeutic trial of Nexium.  Occasional shortness of  breath and occasionally wakes with cough.  She tried benzonatate perles  with no benefit.  Narcotic cough syrups only helped some.  Chest x-ray  dated May 6, is reported from the [place] office showing stable  changes of COPD with no acute finding.  She does have a pacemaker in  place.   MEDICATIONS:  1. Furosemide 40 mg one half to one daily.  2. Avapro 30 mg.  3. Tiazac 120 mg.  4. Micardis 80 mg.  5. Tambocor 100 mg b.i.d.  6. Prometrium 100 mg.  7. Vivelle.  8. Aspirin 81 mg.  9. Lipitor 10 mg.  10.Aleve.  11.Nexium 40 mg.   DRUG INTOLERANCE:  PENICILLIN.   REVIEW OF SYSTEMS:  Some exertional dyspnea, productive cough,  occasional palpitations, weight has been stable,  no adenopathy,  occasional sweat is blamed on hormones, little toe pain, edema, or other  acute process.  She recognized no change in symptoms comparing being at  the beach here or in dry country in [place].   Pulmonary function testing at the [facility] office has shown a  restrictive pattern and outside records I see reference to some  medications that apparently she has not been on sustained therapy with  amiodarone.   PAST HISTORY:  1. Hay-fever as a teenager, never diagnosed with pneumonia.  2. Treated for hypertension.  3. Atrial fibrillation with pacemaker.  4. Elevated cholesterol.   SURGERY:  1. Appendectomy.  2. Partial nephrectomy around 1976.  3. Knee replacement in 2001.   SOCIAL HISTORY:  Never smoked, a glass of wine most days, she is  married, retired.  She walks 2-3 miles a day or plays golf.  Some days  she is a little more short of breath  especially if the weather is very  humid but she does not recognized a lot of sneezing or itching or a big  relationship of her symptoms to seasonal changes.   OBJECTIVE:  VITAL SIGNS:  Weight 136 pounds, BP 136/84, pulse 76, room  air saturation 98%.  GENERAL:  This is a trim lady.  I find no adenopathy.  NOSE:  Clear.  THROAT:  Clear.  LUNGS:  She will cough a little with a deep breath but I do not hear  rales, rhonchi, or wheeze.  Work of breathing is not increased.  HEART:  Sounds are regular.  Now, I do not hear a murmur or gallop  recognizing that she has a pacemaker.  ABDOMEN:  I  do not feel enlargement of liver or spleen.  EXTREMITIES:  There is no cyanosis, clubbing, or peripheral edema.   IMPRESSION:  Chronic bronchitis with recurrent exacerbations, unclear  why.  She believes that allergy vaccine helped her with similar symptoms  in the past but obvious allergy symptoms are not available currently.   PLAN:  1. We will get complete pulmonary function tests to include measured      lung volumes and diffusion  capacity.  2. CT scan of chest.  3. Sputum culture.  4. Schedule return 1 month.  5. We have begun looking at some options.   ADDENDUM:  Dated October 22, 2006, at the time of dictation sputum has  returned growing only normal flora with a mixed gram stain pattern.  Her  peripheral blood count was normal with a white blood count of 5,600,  eosinophils of 3.6%.  Chest CT without contrast showed no acute  abnormality or evidence of interstitial disease or bronchiectasis.  There was mild left basilar  atelectasis or scarring.  RAST panel showed an IgE normal at less than  1.5 and tested allergens were negative.     Clinton D. Maple Hudson, MD, Tonny Bollman, FACP  Electronically Signed    CDY/MedQ  DD: 10/22/2006  DT: 10/23/2006  Job #: [id number]   cc:   Hal T. Stoneking, M.D.

## 2010-08-21 NOTE — Assessment & Plan Note (Signed)
[facility]                             PULMONARY OFFICE NOTE   NAME:Jody Blake, Jody Blake                       MRN:          [id number]  DATE:11/24/2006                            DOB:          [birth year]    PROBLEM LIST:  Cough with chronic bronchitis.   HISTORY:  She says she is feeling better now.  She occasionally brings  up a little clear phlegm but much less than before.  Her cough still  bothers others.  I do not think she pays as much attention to it if she  is alone.  Again, says allergy vaccines seemed to help her in the past.  Does not recognize reflux symptoms.  Trying Nexium but sees no effect.   MEDICATIONS:  1. Furosemide.  2. Avapro 30 mg.  3. Tiazac 120 mg.  4. Micardis 80 mg.  5. Tambocor 100 mg.  6. Prometrium 100 mg.  7. Vivelle-Dot.  8. Aspirin 81 mg.  9. Vitamins.  10.Lipitor 10 mg.  11.Aleve b.i.d.  12.Nexium 40 mg.   DRUG INTOLERANT:  PENICILLIN.   OBJECTIVE:  VITAL SIGNS:  Weight 137 pounds, BP 128/72, pulse 99, room  air saturation 98%.  HEENT:  Nose is clear.  I see on evidence of post nasal drip.  CHEST:  Clear.  She is not coughing at all here.  LUNGS:  Breathing is unlabored.  HEART:  Sounds are regular without murmur.  EXTREMITIES:  There is no edema.   Pulmonary function test from July 30, showed mild obstructive airway  disease with response to bronchodilator and air trapping with increased  residual volume which may hide the actual severity of her obstructive  disease to some extent.  Diffusion was normal.  Sputum culture grew  normal flora.  CBC was completely normal with a WBC of 5,600, 0.2%  eosinophils.  RAST testing profile gave normal IgE at less than 1.5 and  normal specific allergens tested.  Chest CT, July 14, showed no acute  abnormality, no evidence of interstitial disease or bronchiectasis.  Mild left basilar atelectasis and scarring.   IMPRESSION:  Cough consistent with some bronchitis, no  evidence of  bronchiectasis, no evidence of an atopic pattern.   PLAN:  1. Try Spiriva.  2. Reflux precautions as a general instruction.  3. Schedule return 2 months, earlier p.r.n.     Clinton D. Maple Hudson, MD, Tonny Bollman, FACP  Electronically Signed    CDY/MedQ  DD: 12/08/2006  DT: 12/08/2006  Job #: [id number]   cc:   Hal T. Stoneking, M.D.

## 2010-10-11 ENCOUNTER — Ambulatory Visit (INDEPENDENT_AMBULATORY_CARE_PROVIDER_SITE_OTHER): Payer: Medicare Other | Admitting: Internal Medicine

## 2010-10-11 ENCOUNTER — Encounter: Payer: Self-pay | Admitting: Internal Medicine

## 2010-10-11 VITALS — BP 116/70 | HR 65 | Ht 65.0 in | Wt 137.4 lb

## 2010-10-11 DIAGNOSIS — J441 Chronic obstructive pulmonary disease with (acute) exacerbation: Secondary | ICD-10-CM

## 2010-10-11 DIAGNOSIS — J449 Chronic obstructive pulmonary disease, unspecified: Secondary | ICD-10-CM

## 2010-10-11 NOTE — Patient Instructions (Signed)
Continue present meds  Please call as needed 

## 2010-10-11 NOTE — Assessment & Plan Note (Addendum)
Chronic bronchitis- well controlled. I don't have changes to make. We did discuss the availability of Daliresp.

## 2010-10-11 NOTE — Progress Notes (Signed)
  Subjective:    Patient ID: Jody Blake, female    DOB: [birth year], 77 y.o.   MRN: [id number]  HPI 10/11/10- 72 yoF never smoker, followed for allergic rhinitis, chronic bronchitis, complicated by GERD, Hx PAfib/ pacemaker. Last here April 26, 2010. Note reviewed. She says the summer heat hasn't been so bad yet. She has to stop part way up hills or stairs to get her breath. Morning cough is normally. productive of clear phlegm. She walks daily and goes to fitness class twice weekly.  Should check on A1AT status next visit Review of Systems Constitutional:   No weight loss, night sweats,  Fevers, chills, fatigue, lassitude. HEENT:   No headaches,  Difficulty swallowing,  Tooth/dental problems,  Sore throat,                No sneezing, itching, ear ache, nasal congestion, post nasal drip,   CV:  No chest pain,  Orthopnea, PND, swelling in lower extremities, anasarca, dizziness, palpitations  GI  No heartburn, indigestion, abdominal pain, nausea, vomiting, diarrhea, change in bowel habits, loss of appetite  Resp: .  No excess mucus,   No coughing up of blood.  No change in color of mucus.  No wheezing.   Skin: no rash or lesions.  GU: no dysuria, change in color of urine, no urgency or frequency.  No flank pain.  MS:  No joint pain or swelling.  No decreased range of motion.  No back pain.  Psych:  No change in mood or affect. No depression or anxiety.  No memory loss.     Objective:   Physical Exam    General- Alert, Oriented, Affect-appropriate, Distress- none acute   WDWN  Skin- rash-none, lesions- none, excoriation- none  Lymphadenopathy- none  Head- atraumatic  Eyes- Gross vision intact, PERRLA, conjunctivae clear secretions  Ears- Hearing, canals, Tm - normal  Nose- Clear, No- Septal dev, mucus, polyps, erosion, perforation   Throat- Mallampati II , mucosa clear , drainage- none, tonsils- atrophic  Neck- flexible , trachea midline, no stridor , thyroid nl, carotid  no bruit  Chest - symmetrical excursion , unlabored     Heart/CV- RRR , no murmur , no gallop  , no rub, nl s1 s2                     - JVD- none , edema- none, stasis changes- none, varices- none     Lung- clear to P&A, wheeze- none, cough- none , dullness-none, rub- none     Chest wall-   Abd- tender-no, distended-no, bowel sounds-present, HSM- no  Br/ Gen/ Rectal- Not done, not indicated  Extrem- cyanosis- none, clubbing, none, atrophy- none, strength- nl  Neuro- grossly intact to observation      Assessment & Plan:

## 2010-10-13 ENCOUNTER — Other Ambulatory Visit: Payer: Self-pay | Admitting: Internal Medicine

## 2010-10-14 NOTE — Assessment & Plan Note (Addendum)
At risk from current yellow-orange air quality and we discussed staying in [facility] till this pattern clears.

## 2010-12-31 LAB — CBC
HCT: 36.5
HCT: 39.7
HCT: 40.9
Hemoglobin: 12.4
Hemoglobin: 13.4
Hemoglobin: 13.9
MCHC: 33.8
MCHC: 33.8
MCV: 99
MCV: 99.3
MCV: 99.4
Platelets: 247
RBC: 4
RDW: 14
WBC: 7.2
WBC: 7.5

## 2010-12-31 LAB — HEPARIN LEVEL (UNFRACTIONATED): Heparin Unfractionated: 0.18 — ABNORMAL LOW

## 2010-12-31 LAB — COMPREHENSIVE METABOLIC PANEL
Alkaline Phosphatase: 49
BUN: 14
Chloride: 104
Creatinine, Ser: 0.95
GFR calc non Af Amer: 57 — ABNORMAL LOW
Glucose, Bld: 105 — ABNORMAL HIGH
Potassium: 4.3
Total Bilirubin: 0.6

## 2010-12-31 LAB — CARDIAC PANEL(CRET KIN+CKTOT+MB+TROPI)
CK, MB: 2.7
CK, MB: 3.1
Total CK: 48
Troponin I: 0.04
Troponin I: 0.04

## 2010-12-31 LAB — DIFFERENTIAL
Basophils Relative: 1
Eosinophils Absolute: 0.3
Eosinophils Relative: 4
Lymphs Abs: 1.5
Monocytes Absolute: 0.6
Monocytes Relative: 9
Neutrophils Relative %: 65

## 2010-12-31 LAB — I-STAT 8, (EC8 V) (CONVERTED LAB)
Acid-Base Excess: 3 — ABNORMAL HIGH
HCT: 42
Operator id: 294341
Potassium: 3.7
TCO2: 27
pCO2, Ven: 34.5 — ABNORMAL LOW
pH, Ven: 7.48 — ABNORMAL HIGH

## 2010-12-31 LAB — POCT I-STAT CREATININE
Creatinine, Ser: 1.3 — ABNORMAL HIGH
Operator id: 294341

## 2010-12-31 LAB — T4, FREE: Free T4: 1.41

## 2010-12-31 LAB — POCT CARDIAC MARKERS: Myoglobin, poc: 33.3

## 2010-12-31 LAB — MAGNESIUM: Magnesium: 2.3

## 2011-03-09 ENCOUNTER — Other Ambulatory Visit: Payer: Self-pay | Admitting: Internal Medicine

## 2011-04-15 ENCOUNTER — Other Ambulatory Visit: Payer: Self-pay | Admitting: Internal Medicine

## 2011-05-31 ENCOUNTER — Other Ambulatory Visit: Payer: Self-pay | Admitting: Internal Medicine

## 2011-06-02 ENCOUNTER — Other Ambulatory Visit: Payer: Self-pay | Admitting: Internal Medicine

## 2011-06-05 DIAGNOSIS — I495 Sick sinus syndrome: Secondary | ICD-10-CM | POA: Diagnosis not present

## 2011-06-05 DIAGNOSIS — Z950 Presence of cardiac pacemaker: Secondary | ICD-10-CM | POA: Diagnosis not present

## 2011-06-05 NOTE — Telephone Encounter (Signed)
Her PCP should manage this problem long term.

## 2011-06-05 NOTE — Telephone Encounter (Signed)
CY please advise if you are okay giving RX for Nexium-seen in last OV notes where Bronchitis is worsened by GERD but nothing bout you RXing Nexium for patient. Thanks.

## 2011-06-07 ENCOUNTER — Other Ambulatory Visit: Payer: Self-pay | Admitting: Internal Medicine

## 2011-06-14 ENCOUNTER — Other Ambulatory Visit: Payer: Self-pay | Admitting: Internal Medicine

## 2011-06-17 ENCOUNTER — Telehealth: Payer: Self-pay | Admitting: Internal Medicine

## 2011-06-17 MED ORDER — AZITHROMYCIN 250 MG PO TABS: ORAL_TABLET | ORAL | Status: AC

## 2011-06-17 NOTE — Telephone Encounter (Signed)
Spoke with patient-states it started Friday with a sinus headache; everything drained to chest, sore throat,coughed all night-productive-infection taste, yellow in color; denies any fever or chills at this time. Has not tried any OTC medications; but did try cough syrup from years ago that had codeine in it but continued to cough; wheezing as well.    Allergies  Allergen Reactions  . Brimonidine Tartrate-Timolol     REACTION: systemic malaise.  [name] Penicillins    Pt aware of no openings today; please advise if you would like to give medication. Thanks.

## 2011-06-17 NOTE — Telephone Encounter (Signed)
Per CDY- start zpack and mucinex dm.  Spoke with pt and notified of recs and she verbalized understanding.  Rx was sent to pharm.

## 2011-06-18 DIAGNOSIS — K219 Gastro-esophageal reflux disease without esophagitis: Secondary | ICD-10-CM | POA: Diagnosis not present

## 2011-06-18 DIAGNOSIS — I10 Essential (primary) hypertension: Secondary | ICD-10-CM | POA: Diagnosis not present

## 2011-06-18 DIAGNOSIS — Z79899 Other long term (current) drug therapy: Secondary | ICD-10-CM | POA: Diagnosis not present

## 2011-06-18 NOTE — Telephone Encounter (Signed)
Stephanie at Dr Merlene Laughter called the office in regards to Zpak Rx we gave yesterday-pt is on Flecainide and it will interact. 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

## 2011-06-27 ENCOUNTER — Ambulatory Visit (INDEPENDENT_AMBULATORY_CARE_PROVIDER_SITE_OTHER): Payer: Medicare Other | Admitting: Internal Medicine

## 2011-06-27 ENCOUNTER — Ambulatory Visit (INDEPENDENT_AMBULATORY_CARE_PROVIDER_SITE_OTHER)
Admission: RE | Admit: 2011-06-27 | Discharge: 2011-06-27 | Disposition: A | Discharge status: Home / Self Care | Payer: Medicare Other | Source: Ambulatory Visit | Attending: Internal Medicine | Admitting: Internal Medicine

## 2011-06-27 ENCOUNTER — Encounter: Payer: Self-pay | Admitting: Internal Medicine

## 2011-06-27 VITALS — BP 126/70 | HR 65 | Ht 65.0 in | Wt 138.0 lb

## 2011-06-27 DIAGNOSIS — J441 Chronic obstructive pulmonary disease with (acute) exacerbation: Secondary | ICD-10-CM

## 2011-06-27 DIAGNOSIS — J209 Acute bronchitis, unspecified: Secondary | ICD-10-CM

## 2011-06-27 DIAGNOSIS — K219 Gastro-esophageal reflux disease without esophagitis: Secondary | ICD-10-CM | POA: Diagnosis not present

## 2011-06-27 DIAGNOSIS — I4891 Unspecified atrial fibrillation: Secondary | ICD-10-CM | POA: Diagnosis not present

## 2011-06-27 MED ORDER — CLARITHROMYCIN 500 MG PO TABS: ORAL_TABLET | ORAL | Status: AC

## 2011-06-27 MED ORDER — METHYLPREDNISOLONE ACETATE 80 MG/ML IJ SUSP
80.0000 mg | Freq: Once | INTRAMUSCULAR | Status: AC
  Administered 2011-06-27: 80 mg via INTRAMUSCULAR

## 2011-06-27 MED ORDER — PROMETHAZINE-CODEINE 6.25-10 MG/5ML PO SYRP: 5.0000 mL | ORAL_SOLUTION | ORAL | Status: AC | PRN

## 2011-06-27 MED ORDER — LEVALBUTEROL HCL 0.63 MG/3ML IN NEBU
0.6300 mg | INHALATION_SOLUTION | Freq: Once | RESPIRATORY_TRACT | Status: AC
  Administered 2011-06-27: 0.63 mg via RESPIRATORY_TRACT

## 2011-06-27 NOTE — Progress Notes (Signed)
   Patient ID: Jody Blake, female    DOB: 1925-10-10, 78 y.o.   MRN: [id number]  HPI 10/11/10- 35 yoF never smoker, followed for allergic rhinitis, chronic bronchitis, complicated by GERD, Hx PAfib/ pacemaker. Last here April 26, 2010. Note reviewed. She says the summer heat hasn't been so bad yet. She has to stop part way up hills or stairs to get her breath. Morning cough is normally. productive of clear phlegm. She walks daily and goes to fitness class twice weekly.  Should check on A1AT status next visit  06/27/11- 84 yoF never smoker, followed for allergic rhinitis, chronic bronchitis, complicated by GERD, Hx PAfib/ pacemaker. Acute visit-cough-productive-mostly clear and gets worse in the afternoon; streaks of blood (tiny amount) from time to time. Having SOB and wheezing as well 2 weeks ago exposed to cold wind outside. Sinus congestion cough malaise and weakness. Had congested with yellow nasal discharge but no headache. Chest feels wheezy with cough. Took one week of doxycycline and Mucinex DM. Denies fever, chills, pain.  ROS-see HPI Constitutional:   No-   weight loss, night sweats, fevers, chills, fatigue, lassitude. HEENT:   No-  headaches, difficulty swallowing, tooth/dental problems, sore throat,       No-  sneezing, itching, ear ache, nasal congestion, post nasal drip,  CV:  No-   chest pain, orthopnea, PND, swelling in lower extremities, anasarca, dizziness, palpitations Resp: No-   shortness of breath with exertion or at rest.              .+  productive cough,  + non-productive cough,  + coughing up of blood specks.              +   change in color of mucus.  + wheezing.   Skin: No-   rash or lesions. GI:  No-   heartburn, indigestion, abdominal pain, nausea, vomiting, GU: MS:  No-   joint pain or swelling. Neuro-     nothing unusual Psych:  No- change in mood or affect. No depression or anxiety.  No memory loss.   OBJ- Physical Exam General- Alert, Oriented,  Affect-appropriate, Distress- none acute Skin- rash-none, lesions- none, excoriation- none Lymphadenopathy- none Head- atraumatic            Eyes- Gross vision intact, PERRLA, conjunctivae and secretions clear            Ears- Hearing, canals-normal            Nose- Clear, no-Septal dev, mucus, polyps, erosion, perforation             Throat- Mallampati II-III , mucosa clear , drainage- none, tonsils- atrophic Neck- flexible , trachea midline, no stridor , thyroid nl, carotid no bruit Chest - symmetrical excursion , unlabored           Heart/CV- RRR , no murmur , no gallop  , no rub, nl s1 s2                           - JVD- none , edema- none, stasis changes- none, varices- none           Lung- clear to P&A, wheeze+, cough+ , dullness-none, rub- none           Chest wall-  Abd-  Br/ Gen/ Rectal- Not done, not indicated Extrem- cyanosis- none, clubbing, none, atrophy- none, strength- nl Neuro- grossly intact to observation

## 2011-06-27 NOTE — Patient Instructions (Signed)
Scripts for biaxin antibiotic and for cough syrup  Neb xop 0.63  Depo 80  Order CXR- dx acute bronchitis

## 2011-06-30 NOTE — Assessment & Plan Note (Signed)
Situation consistent with viral onset caused by getting chilled. Probable secondary bacterial infection. Plan-chest x-ray, nebulized Xopenex, Medrol, cough syrup, Biaxin.

## 2011-06-30 NOTE — Assessment & Plan Note (Signed)
Exam consistent with sinus rhythm today.

## 2011-06-30 NOTE — Assessment & Plan Note (Signed)
No recent recognized reflux event.

## 2011-07-26 DIAGNOSIS — D3132 Benign neoplasm of left choroid: Secondary | ICD-10-CM | POA: Insufficient documentation

## 2011-07-26 DIAGNOSIS — H3530 Unspecified macular degeneration: Secondary | ICD-10-CM | POA: Diagnosis not present

## 2011-07-26 DIAGNOSIS — D3130 Benign neoplasm of unspecified choroid: Secondary | ICD-10-CM | POA: Diagnosis not present

## 2011-07-29 ENCOUNTER — Other Ambulatory Visit: Payer: Self-pay | Admitting: Internal Medicine

## 2011-08-19 DIAGNOSIS — R5381 Other malaise: Secondary | ICD-10-CM | POA: Diagnosis not present

## 2011-08-19 DIAGNOSIS — M542 Cervicalgia: Secondary | ICD-10-CM | POA: Diagnosis not present

## 2011-08-21 ENCOUNTER — Ambulatory Visit
Admission: RE | Admit: 2011-08-21 | Discharge: 2011-08-21 | Disposition: A | Discharge status: Home / Self Care | Payer: Medicare Other | Source: Ambulatory Visit | Attending: Internal Medicine | Admitting: Internal Medicine

## 2011-08-21 ENCOUNTER — Other Ambulatory Visit: Payer: Self-pay | Admitting: Internal Medicine

## 2011-08-21 DIAGNOSIS — M542 Cervicalgia: Secondary | ICD-10-CM | POA: Diagnosis not present

## 2011-08-21 DIAGNOSIS — D72829 Elevated white blood cell count, unspecified: Secondary | ICD-10-CM | POA: Diagnosis not present

## 2011-08-26 DIAGNOSIS — D72829 Elevated white blood cell count, unspecified: Secondary | ICD-10-CM | POA: Diagnosis not present

## 2011-09-09 DIAGNOSIS — Z950 Presence of cardiac pacemaker: Secondary | ICD-10-CM | POA: Diagnosis not present

## 2011-09-13 ENCOUNTER — Other Ambulatory Visit: Payer: Self-pay | Admitting: Internal Medicine

## 2011-09-13 MED ORDER — FLUTICASONE PROPIONATE 50 MCG/ACT NA SUSP: 2.0000 | Freq: Every day | NASAL | Status: DC

## 2011-09-13 MED ORDER — MONTELUKAST SODIUM 10 MG PO TABS: 10.0000 mg | ORAL_TABLET | Freq: Every day | ORAL | Status: DC

## 2011-09-16 ENCOUNTER — Other Ambulatory Visit: Payer: Self-pay | Admitting: Internal Medicine

## 2011-09-30 ENCOUNTER — Other Ambulatory Visit: Payer: Self-pay | Admitting: *Deleted

## 2011-09-30 MED ORDER — MONTELUKAST SODIUM 10 MG PO TABS: 10.0000 mg | ORAL_TABLET | Freq: Every day | ORAL | Status: DC

## 2011-09-30 MED ORDER — BUDESONIDE-FORMOTEROL FUMARATE 160-4.5 MCG/ACT IN AERO: 2.0000 | INHALATION_SPRAY | Freq: Two times a day (BID) | RESPIRATORY_TRACT | Status: DC

## 2011-10-14 DIAGNOSIS — I4891 Unspecified atrial fibrillation: Secondary | ICD-10-CM | POA: Diagnosis not present

## 2011-10-14 DIAGNOSIS — I10 Essential (primary) hypertension: Secondary | ICD-10-CM | POA: Diagnosis not present

## 2011-10-14 DIAGNOSIS — Z950 Presence of cardiac pacemaker: Secondary | ICD-10-CM | POA: Diagnosis not present

## 2011-10-14 DIAGNOSIS — R42 Dizziness and giddiness: Secondary | ICD-10-CM | POA: Diagnosis not present

## 2011-10-17 ENCOUNTER — Ambulatory Visit: Payer: Medicare Other | Admitting: Internal Medicine

## 2011-10-28 DIAGNOSIS — R42 Dizziness and giddiness: Secondary | ICD-10-CM | POA: Diagnosis not present

## 2011-10-28 DIAGNOSIS — I10 Essential (primary) hypertension: Secondary | ICD-10-CM | POA: Diagnosis not present

## 2011-10-28 DIAGNOSIS — Z950 Presence of cardiac pacemaker: Secondary | ICD-10-CM | POA: Diagnosis not present

## 2011-10-28 DIAGNOSIS — I4891 Unspecified atrial fibrillation: Secondary | ICD-10-CM | POA: Diagnosis not present

## 2011-11-06 DIAGNOSIS — I10 Essential (primary) hypertension: Secondary | ICD-10-CM | POA: Diagnosis not present

## 2011-11-06 DIAGNOSIS — Z950 Presence of cardiac pacemaker: Secondary | ICD-10-CM | POA: Diagnosis not present

## 2011-11-06 DIAGNOSIS — I4891 Unspecified atrial fibrillation: Secondary | ICD-10-CM | POA: Diagnosis not present

## 2011-11-06 DIAGNOSIS — R42 Dizziness and giddiness: Secondary | ICD-10-CM | POA: Diagnosis not present

## 2011-12-12 ENCOUNTER — Other Ambulatory Visit: Payer: Self-pay | Admitting: Internal Medicine

## 2011-12-16 DIAGNOSIS — I495 Sick sinus syndrome: Secondary | ICD-10-CM | POA: Diagnosis not present

## 2011-12-16 DIAGNOSIS — Z950 Presence of cardiac pacemaker: Secondary | ICD-10-CM | POA: Diagnosis not present

## 2011-12-19 DIAGNOSIS — Z1331 Encounter for screening for depression: Secondary | ICD-10-CM | POA: Diagnosis not present

## 2011-12-19 DIAGNOSIS — Z79899 Other long term (current) drug therapy: Secondary | ICD-10-CM | POA: Diagnosis not present

## 2011-12-19 DIAGNOSIS — I10 Essential (primary) hypertension: Secondary | ICD-10-CM | POA: Diagnosis not present

## 2012-01-21 DIAGNOSIS — Z23 Encounter for immunization: Secondary | ICD-10-CM | POA: Diagnosis not present

## 2012-02-05 DIAGNOSIS — Z124 Encounter for screening for malignant neoplasm of cervix: Secondary | ICD-10-CM | POA: Diagnosis not present

## 2012-02-05 DIAGNOSIS — Z01419 Encounter for gynecological examination (general) (routine) without abnormal findings: Secondary | ICD-10-CM | POA: Diagnosis not present

## 2012-03-12 ENCOUNTER — Telehealth: Payer: Self-pay | Admitting: Internal Medicine

## 2012-03-12 MED ORDER — MONTELUKAST SODIUM 10 MG PO TABS: 10.0000 mg | ORAL_TABLET | Freq: Every day | ORAL | Status: DC

## 2012-03-12 NOTE — Telephone Encounter (Signed)
Rx has been sent in. 

## 2012-04-06 ENCOUNTER — Telehealth: Payer: Self-pay | Admitting: Internal Medicine

## 2012-04-06 MED ORDER — PROMETHAZINE-CODEINE 6.25-10 MG/5ML PO SYRP: 5.0000 mL | ORAL_SOLUTION | Freq: Four times a day (QID) | ORAL | Status: DC | PRN

## 2012-04-06 MED ORDER — CLARITHROMYCIN 500 MG PO TABS: 500.0000 mg | ORAL_TABLET | Freq: Two times a day (BID) | ORAL | Status: DC

## 2012-04-06 NOTE — Telephone Encounter (Signed)
Spoke with pt She is c/o cough x 4 days Cough is prod with large amounts of foul tasting, yellow sputum She states has noticed some wheeze Denies any increased SOV, CP/chest tightness, f/c/s Asking for pred taper and "long acting abx" Last ov 06/27/11 Next ov 06/26/12 Allergies  Allergen Reactions  . Brimonidine Tartrate-Timolol     REACTION: systemic malaise.  [name] Penicillins

## 2012-04-06 NOTE — Telephone Encounter (Signed)
PER CY  BIAXIN 500 MG #14 take 1 tablet bid  Promet/codiene take 1 tsp  Every 6  Hour  Prn cough  rx called in and pt is aware  Nothing further needed.

## 2012-04-13 DIAGNOSIS — Z950 Presence of cardiac pacemaker: Secondary | ICD-10-CM | POA: Diagnosis not present

## 2012-04-13 DIAGNOSIS — I10 Essential (primary) hypertension: Secondary | ICD-10-CM | POA: Diagnosis not present

## 2012-04-13 DIAGNOSIS — I4891 Unspecified atrial fibrillation: Secondary | ICD-10-CM | POA: Diagnosis not present

## 2012-04-13 DIAGNOSIS — I359 Nonrheumatic aortic valve disorder, unspecified: Secondary | ICD-10-CM | POA: Diagnosis not present

## 2012-04-13 DIAGNOSIS — Z7901 Long term (current) use of anticoagulants: Secondary | ICD-10-CM | POA: Diagnosis not present

## 2012-04-13 DIAGNOSIS — S40029A Contusion of unspecified upper arm, initial encounter: Secondary | ICD-10-CM | POA: Diagnosis not present

## 2012-04-15 DIAGNOSIS — T148XXA Other injury of unspecified body region, initial encounter: Secondary | ICD-10-CM | POA: Diagnosis not present

## 2012-04-30 DIAGNOSIS — Z961 Presence of intraocular lens: Secondary | ICD-10-CM | POA: Diagnosis not present

## 2012-04-30 DIAGNOSIS — H40019 Open angle with borderline findings, low risk, unspecified eye: Secondary | ICD-10-CM | POA: Diagnosis not present

## 2012-04-30 DIAGNOSIS — H353190 Nonexudative age-related macular degeneration, unspecified eye, stage unspecified: Secondary | ICD-10-CM | POA: Diagnosis not present

## 2012-06-12 DIAGNOSIS — M79609 Pain in unspecified limb: Secondary | ICD-10-CM | POA: Diagnosis not present

## 2012-06-12 DIAGNOSIS — L539 Erythematous condition, unspecified: Secondary | ICD-10-CM | POA: Diagnosis not present

## 2012-06-12 DIAGNOSIS — M7989 Other specified soft tissue disorders: Secondary | ICD-10-CM | POA: Diagnosis not present

## 2012-06-26 ENCOUNTER — Other Ambulatory Visit: Payer: Medicare Other

## 2012-06-26 ENCOUNTER — Ambulatory Visit (INDEPENDENT_AMBULATORY_CARE_PROVIDER_SITE_OTHER): Payer: Medicare Other | Admitting: Internal Medicine

## 2012-06-26 ENCOUNTER — Encounter: Payer: Self-pay | Admitting: Internal Medicine

## 2012-06-26 VITALS — BP 144/84 | HR 81 | Ht 64.25 in | Wt 133.2 lb

## 2012-06-26 DIAGNOSIS — J441 Chronic obstructive pulmonary disease with (acute) exacerbation: Secondary | ICD-10-CM

## 2012-06-26 DIAGNOSIS — I4891 Unspecified atrial fibrillation: Secondary | ICD-10-CM | POA: Diagnosis not present

## 2012-06-26 DIAGNOSIS — J449 Chronic obstructive pulmonary disease, unspecified: Secondary | ICD-10-CM

## 2012-06-26 MED ORDER — LEVALBUTEROL TARTRATE 45 MCG/ACT IN AERO: INHALATION_SPRAY | RESPIRATORY_TRACT | Status: DC

## 2012-06-26 MED ORDER — BUDESONIDE-FORMOTEROL FUMARATE 160-4.5 MCG/ACT IN AERO: 2.0000 | INHALATION_SPRAY | Freq: Two times a day (BID) | RESPIRATORY_TRACT | Status: DC

## 2012-06-26 NOTE — Patient Instructions (Addendum)
Order- lab a1AT assay    Dex chronic bronchitis  Scripts sent for Xopenex and Symbicort refills  Please call as needed

## 2012-06-26 NOTE — Progress Notes (Signed)
Patient ID: Jody Blake, female    DOB: March 31, 1926, 79 y.o.   MRN: [id number]  HPI 10/11/10- 70 yoF never smoker, followed for allergic rhinitis, chronic bronchitis, complicated by GERD, Hx PAfib/ pacemaker. Last here April 26, 2010. Note reviewed. She says the summer heat hasn't been so bad yet. She has to stop part way up hills or stairs to get her breath. Morning cough is normally. productive of clear phlegm. She walks daily and goes to fitness class twice weekly.  Should check on A1AT status next visit  06/27/11- 84 yoF never smoker, followed for allergic rhinitis, chronic bronchitis, complicated by GERD, Hx PAfib/ pacemaker. Acute visit-cough-productive-mostly clear and gets worse in the afternoon; streaks of blood (tiny amount) from time to time. Having SOB and wheezing as well 2 weeks ago exposed to cold wind outside. Sinus congestion cough malaise and weakness. Had congested with yellow nasal discharge but no headache. Chest feels wheezy with cough. Took one week of doxycycline and Mucinex DM. Denies fever, chills, pain.  06/26/12- 84 yoF never smoker, followed for allergic rhinitis, chronic bronchitis, complicated by GERD, Hx PAfib/ pacemaker. FOLLOWS FOR: SOB and wheezing at times-activity makes it worse; slight cough-productive-clear in color, congestion as well. One episode of winter bronchitis treated and resolved. Feels well now. Grew up in smoking household. No hemoptysis in a long time, remaining on Pradaxa. Keeps light, chronic cough especially early morning with clear phlegm. CXR 07/03/11 IMPRESSION:  No active disease. Hyperinflation again noted.  Original Report Authenticated By: Natasha Mead, M.D.  ROS-see HPI Constitutional:   No-   weight loss, night sweats, fevers, chills, fatigue, lassitude. HEENT:   No-  headaches, difficulty swallowing, tooth/dental problems, sore throat,       No-  sneezing, itching, ear ache, nasal congestion, post nasal drip,  CV:  No-   chest  pain, orthopnea, PND, swelling in lower extremities, anasarca, dizziness, palpitations Resp: No-   shortness of breath with exertion or at rest.              .+  productive cough,  + non-productive cough, no- coughing up of blood specks.              No-change in color of mucus. No- wheezing.   Skin: No-   rash or lesions. GI:  No-   heartburn, indigestion, abdominal pain, nausea, vomiting, GU: MS:  No-   joint pain or swelling. Neuro-     nothing unusual Psych:  No- change in mood or affect. No depression or anxiety.  No memory loss.   OBJ- Physical Exam General- Alert, Oriented, Affect-appropriate, Distress- none acute. Thin Skin- rash-none, lesions- none, excoriation- none Lymphadenopathy- none Head- atraumatic            Eyes- Gross vision intact, PERRLA, conjunctivae and secretions clear            Ears- Hearing, canals-normal            Nose- Clear, no-Septal dev, mucus, polyps, erosion, perforation             Throat- Mallampati II-III , mucosa clear , drainage- none, tonsils- atrophic Neck- flexible , trachea midline, no stridor , thyroid nl, carotid no bruit Chest - symmetrical excursion , unlabored           Heart/CV- RRR , 1+ murmur AS , no gallop  , no rub, nl s1 s2                           -  JVD- none , edema- none, stasis changes- none, varices- none           Lung- clear to P&A, wheeze-none, cough-none, dullness-none, rub- none           Chest wall-  Abd-  Br/ Gen/ Rectal- Not done, not indicated Extrem- cyanosis- none, clubbing, none, atrophy- none, strength- nl Neuro- grossly intact to observation

## 2012-06-30 DIAGNOSIS — Z79899 Other long term (current) drug therapy: Secondary | ICD-10-CM | POA: Diagnosis not present

## 2012-06-30 DIAGNOSIS — I10 Essential (primary) hypertension: Secondary | ICD-10-CM | POA: Diagnosis not present

## 2012-06-30 DIAGNOSIS — I872 Venous insufficiency (chronic) (peripheral): Secondary | ICD-10-CM | POA: Diagnosis not present

## 2012-06-30 DIAGNOSIS — Z0000 Encounter for general adult medical examination without abnormal findings: Secondary | ICD-10-CM | POA: Diagnosis not present

## 2012-07-04 NOTE — Assessment & Plan Note (Signed)
Controlled. On Pradaxa.

## 2012-07-04 NOTE — Assessment & Plan Note (Signed)
Stable now after an acute bronchitis this winter. No more hemoptysis although she remains on anticoagulant Pradaxa. Clear chest x-ray. No need to repeat chest x-ray this visit. She will continue current bronchodilators as discussed, with refills.

## 2012-07-09 ENCOUNTER — Telehealth: Payer: Self-pay | Admitting: Pulmonary Disease

## 2012-07-09 NOTE — Telephone Encounter (Signed)
Notes Recorded by Waymon Budge, MD on 07/07/2012 at 5:26 PM Normal alpha 1 gene- no genetic increased risk for emphysema ---  I spoke with patient about results and she verbalized understanding and had no questions

## 2012-07-20 DIAGNOSIS — I495 Sick sinus syndrome: Secondary | ICD-10-CM | POA: Diagnosis not present

## 2012-07-20 DIAGNOSIS — Z950 Presence of cardiac pacemaker: Secondary | ICD-10-CM | POA: Diagnosis not present

## 2012-07-28 DIAGNOSIS — H353190 Nonexudative age-related macular degeneration, unspecified eye, stage unspecified: Secondary | ICD-10-CM | POA: Diagnosis not present

## 2012-07-28 DIAGNOSIS — D3130 Benign neoplasm of unspecified choroid: Secondary | ICD-10-CM | POA: Diagnosis not present

## 2012-08-11 DIAGNOSIS — I129 Hypertensive chronic kidney disease with stage 1 through stage 4 chronic kidney disease, or unspecified chronic kidney disease: Secondary | ICD-10-CM | POA: Diagnosis not present

## 2012-08-11 DIAGNOSIS — L989 Disorder of the skin and subcutaneous tissue, unspecified: Secondary | ICD-10-CM | POA: Diagnosis not present

## 2012-09-05 ENCOUNTER — Observation Stay (HOSPITAL_COMMUNITY)
Admission: EM | Admit: 2012-09-05 | Discharge: 2012-09-08 | Disposition: A | Discharge status: Home / Self Care | Payer: Medicare Other | Attending: Family Medicine | Admitting: Family Medicine

## 2012-09-05 DIAGNOSIS — I951 Orthostatic hypotension: Secondary | ICD-10-CM | POA: Diagnosis not present

## 2012-09-05 DIAGNOSIS — J449 Chronic obstructive pulmonary disease, unspecified: Secondary | ICD-10-CM | POA: Diagnosis present

## 2012-09-05 DIAGNOSIS — S59909A Unspecified injury of unspecified elbow, initial encounter: Secondary | ICD-10-CM | POA: Diagnosis not present

## 2012-09-05 DIAGNOSIS — I359 Nonrheumatic aortic valve disorder, unspecified: Secondary | ICD-10-CM | POA: Diagnosis not present

## 2012-09-05 DIAGNOSIS — S0990XA Unspecified injury of head, initial encounter: Secondary | ICD-10-CM | POA: Diagnosis not present

## 2012-09-05 DIAGNOSIS — E860 Dehydration: Secondary | ICD-10-CM | POA: Diagnosis not present

## 2012-09-05 DIAGNOSIS — W19XXXA Unspecified fall, initial encounter: Secondary | ICD-10-CM | POA: Insufficient documentation

## 2012-09-05 DIAGNOSIS — S51009A Unspecified open wound of unspecified elbow, initial encounter: Secondary | ICD-10-CM | POA: Diagnosis not present

## 2012-09-05 DIAGNOSIS — Z7901 Long term (current) use of anticoagulants: Secondary | ICD-10-CM | POA: Diagnosis not present

## 2012-09-05 DIAGNOSIS — R55 Syncope and collapse: Secondary | ICD-10-CM

## 2012-09-05 DIAGNOSIS — I4821 Permanent atrial fibrillation: Secondary | ICD-10-CM | POA: Diagnosis present

## 2012-09-05 DIAGNOSIS — IMO0002 Reserved for concepts with insufficient information to code with codable children: Secondary | ICD-10-CM | POA: Diagnosis not present

## 2012-09-05 DIAGNOSIS — N189 Chronic kidney disease, unspecified: Secondary | ICD-10-CM | POA: Insufficient documentation

## 2012-09-05 DIAGNOSIS — I4891 Unspecified atrial fibrillation: Secondary | ICD-10-CM | POA: Insufficient documentation

## 2012-09-05 DIAGNOSIS — Z79899 Other long term (current) drug therapy: Secondary | ICD-10-CM | POA: Diagnosis not present

## 2012-09-05 DIAGNOSIS — S0003XA Contusion of scalp, initial encounter: Secondary | ICD-10-CM | POA: Diagnosis not present

## 2012-09-05 DIAGNOSIS — I129 Hypertensive chronic kidney disease with stage 1 through stage 4 chronic kidney disease, or unspecified chronic kidney disease: Secondary | ICD-10-CM | POA: Insufficient documentation

## 2012-09-05 DIAGNOSIS — J441 Chronic obstructive pulmonary disease with (acute) exacerbation: Secondary | ICD-10-CM

## 2012-09-05 DIAGNOSIS — T1490XA Injury, unspecified, initial encounter: Secondary | ICD-10-CM | POA: Diagnosis not present

## 2012-09-05 DIAGNOSIS — M25529 Pain in unspecified elbow: Secondary | ICD-10-CM | POA: Diagnosis not present

## 2012-09-05 DIAGNOSIS — Z950 Presence of cardiac pacemaker: Secondary | ICD-10-CM | POA: Insufficient documentation

## 2012-09-05 DIAGNOSIS — I10 Essential (primary) hypertension: Secondary | ICD-10-CM

## 2012-09-05 DIAGNOSIS — M7989 Other specified soft tissue disorders: Secondary | ICD-10-CM | POA: Diagnosis not present

## 2012-09-05 DIAGNOSIS — T07XXXA Unspecified multiple injuries, initial encounter: Secondary | ICD-10-CM | POA: Diagnosis not present

## 2012-09-05 DIAGNOSIS — J4489 Other specified chronic obstructive pulmonary disease: Secondary | ICD-10-CM | POA: Insufficient documentation

## 2012-09-05 LAB — POCT I-STAT, CHEM 8
BUN: 34 mg/dL — ABNORMAL HIGH (ref 6–23)
Calcium, Ion: 1.18 mmol/L (ref 1.13–1.30)
Creatinine, Ser: 1.4 mg/dL — ABNORMAL HIGH (ref 0.50–1.10)
Glucose, Bld: 114 mg/dL — ABNORMAL HIGH (ref 70–99)
TCO2: 25 mmol/L (ref 0–100)

## 2012-09-05 MED ORDER — SODIUM CHLORIDE 0.9 % IV BOLUS (SEPSIS)
1000.0000 mL | Freq: Once | INTRAVENOUS | Status: AC
  Administered 2012-09-06: 1000 mL via INTRAVENOUS

## 2012-09-05 NOTE — ED Notes (Signed)
Pt reports mild dizziness before falling. Pt states mild dizziness is normal for her.

## 2012-09-05 NOTE — ED Notes (Signed)
Pt reports she is on Pradaxa.

## 2012-09-05 NOTE — ED Provider Notes (Signed)
History     CSN: [id number]  Arrival date & time 09/05/12  2221   First MD Initiated Contact with Patient 09/05/12 2308      Chief Complaint  Patient presents with  . Fall    (Consider location/radiation/quality/duration/timing/severity/associated sxs/prior treatment) HPI History is provided by patient - was at a wedding tonight and did have a few glasses of wine, denies feeling intoxicated. After driving home, went to get the car felt near syncopal and had a witnessed syncopal event. She fell backwards striking the back of her head on concrete. No reported seizure activity. No confusion after event. No chest pain or shortness of breath. No palpitations. Patient is on Prasdaxa. She also injured her right and left elbows with swelling but no difficulty moving her arms. No neck, back injuries. No history of same.  Past Medical History  Diagnosis Date  . Esophageal reflux   . Obstructive chronic bronchitis with exacerbation   . Pacemaker   . Rhinitis     Past Surgical History  Procedure Laterality Date  . Partital nephrectomy  1976    stone disease  . Appendectomy    . Foot surgery    . Total knee arthroplasty    . Incisional hernia repair      Family History  Problem Relation Age of Onset  . Malignant hypertension Father   . Breast cancer Mother     History  Substance Use Topics  . Smoking status: Never Smoker   . Smokeless tobacco: Not on file  . Alcohol Use: Not on file    OB History   Grav Para Term Preterm Abortions TAB SAB Ect Mult Living                  Review of Systems  Constitutional: Negative for fever and chills.  HENT: Negative for neck pain and neck stiffness.   Eyes: Negative for pain.  Respiratory: Negative for shortness of breath.   Cardiovascular: Negative for chest pain.  Gastrointestinal: Negative for nausea, vomiting and abdominal pain.  Genitourinary: Negative for dysuria and flank pain.  Musculoskeletal: Negative for back pain.   Skin: Positive for wound. Negative for rash.  Neurological: Positive for syncope. Negative for seizures, speech difficulty and weakness.  All other systems reviewed and are negative.    Allergies  Brimonidine tartrate-timolol and Penicillins  Home Medications   Current Outpatient Rx  Name  Route  Sig  Dispense  Refill  . budesonide-formoterol (SYMBICORT) 160-4.5 MCG/ACT inhaler   Inhalation   Inhale 2 puffs into the lungs 2 (two) times daily. Rinse mouth   30.6 g   3   . Cholecalciferol (VITAMIN D) 2000 UNITS CAPS   Oral   Take 1 capsule by mouth daily.           . dabigatran (PRADAXA) 150 MG CAPS   Oral   Take 150 mg by mouth 2 (two) times daily.          [name] diltiazem (CARDIZEM CD) 240 MG 24 hr capsule   Oral   Take 240 mg by mouth daily.         . flecainide (TAMBOCOR) 150 MG tablet   Oral   Take 150 mg by mouth 2 (two) times daily.          . fluticasone (FLONASE) 50 MCG/ACT nasal spray   Nasal   Place 2 sprays into the nose daily as needed for rhinitis.          [name]  furosemide (LASIX) 40 MG tablet   Oral   Take 20 mg by mouth daily. Take 1/2 to 1 by mouth daily         . irbesartan (AVAPRO) 300 MG tablet   Oral   Take 300 mg by mouth daily.           [name] levalbuterol (XOPENEX HFA) 45 MCG/ACT inhaler      2 puffs four times daily as needed rescue inhaler   3 Inhaler   3   . Lutein 20 MG CAPS   Oral   Take 1 capsule by mouth daily.           . montelukast (SINGULAIR) 10 MG tablet   Oral   Take 1 tablet (10 mg total) by mouth at bedtime.   90 tablet   1   . Multiple Vitamin (MULTIVITAMIN) tablet   Oral   Take 1 tablet by mouth daily.           . Multiple Vitamins-Minerals (PRESERVISION AREDS PO)   Oral   Take 2 tablets by mouth daily.           . Naproxen Sodium (ALEVE) 220 MG CAPS   Oral   Take 2 capsules by mouth daily.         . promethazine-codeine (PHENERGAN WITH CODEINE) 6.25-10 MG/5ML syrup   Oral   Take 5 mLs by  mouth every 6 (six) hours as needed for cough.   200 mL   0     BP 183/85  Pulse 61  Temp(Src) 98.1 F (36.7 C) (Oral)  Resp 15  SpO2 98%  Physical Exam  Constitutional: She is oriented to person, place, and time. She appears well-developed and well-nourished.  HENT:  Head: Normocephalic.   Posterior scalp abrasion with bleeding controlled. No bony deformity  Eyes: EOM are normal. Pupils are equal, round, and reactive to light.  Neck: Neck supple.  No cervical spine tenderness or deformity  Cardiovascular: Normal rate, regular rhythm and intact distal pulses.   Pulmonary/Chest: Effort normal and breath sounds normal. No respiratory distress.  Abdominal: Soft. Bowel sounds are normal. She exhibits no distension. There is no tenderness.  Musculoskeletal: Normal range of motion.  Right elbow with tenderness and swelling and superficial abrasion. No obvious bony deformity with good range of motion. There is no tenderness over shoulder, wrist or hand. Distal neurovascular intact. Left elbow with tenderness and swelling and superficial abrasion. Associated 1.5cm full thickness lac. No obvious deformity and full range of motion with distal neurovascular intact. Nontender over hand, shoulder and wrist  Neurological: She is alert and oriented to person, place, and time. No cranial nerve deficit. Coordination normal.  Skin: Skin is warm and dry.    ED Course  LACERATION REPAIR Date/Time: 09/06/2012 4:10 AM Performed by: Sunnie Nielsen Authorized by: Sunnie Nielsen Consent: Verbal consent obtained. Risks and benefits: risks, benefits and alternatives were discussed Consent given by: patient Patient understanding: patient states understanding of the procedure being performed Patient consent: the patient's understanding of the procedure matches consent given Procedure consent: procedure consent matches procedure scheduled Required items: required blood products, implants, devices, and special  equipment available Patient identity confirmed: verbally with patient Time out: Immediately prior to procedure a "time out" was called to verify the correct patient, procedure, equipment, support staff and site/side marked as required. Body area: upper extremity (L elbow) Laceration length: 1.5 cm Tendon involvement: none Nerve involvement: none Vascular damage: no Anesthesia: local infiltration Local anesthetic:  lidocaine 1% without epinephrine Anesthetic total: 2 ml Preparation: Patient was prepped and draped in the usual sterile fashion. Irrigation solution: saline Amount of cleaning: standard Skin closure: 4-0 nylon Number of sutures: 1 Technique: simple Approximation: close Approximation difficulty: simple Dressing: 4x4 sterile gauze and antibiotic ointment Patient tolerance: Patient tolerated the procedure well with no immediate complications.   (including critical care time)  Results for orders placed during the hospital encounter of 09/05/12  CBC      Result Value Range   WBC 10.1  4.0 - 10.5 K/uL   RBC 3.91  3.87 - 5.11 MIL/uL   Hemoglobin 12.7  12.0 - 15.0 g/dL   HCT 40.9  81.1 - 91.4 %   MCV 95.1  78.0 - 100.0 fL   MCH 32.5  26.0 - 34.0 pg   MCHC 34.1  30.0 - 36.0 g/dL   RDW 78.2  95.6 - 21.3 %   Platelets 271  150 - 400 K/uL  COMPREHENSIVE METABOLIC PANEL      Result Value Range   Sodium 134 (*) 135 - 145 mEq/L   Potassium 3.9  3.5 - 5.1 mEq/L   Chloride 99  96 - 112 mEq/L   CO2 25  19 - 32 mEq/L   Glucose, Bld 117 (*) 70 - 99 mg/dL   BUN 34 (*) 6 - 23 mg/dL   Creatinine, Ser 0.86 (*) 0.50 - 1.10 mg/dL   Calcium 9.4  8.4 - 57.8 mg/dL   Total Protein 7.0  6.0 - 8.3 g/dL   Albumin 3.9  3.5 - 5.2 g/dL   AST 35  0 - 37 U/L   ALT 25  0 - 35 U/L   Alkaline Phosphatase 55  39 - 117 U/L   Total Bilirubin 0.3  0.3 - 1.2 mg/dL   GFR calc non Af Amer 35 (*) >90 mL/min   GFR calc Af Amer 41 (*) >90 mL/min  URINALYSIS, ROUTINE W REFLEX MICROSCOPIC      Result  Value Range   Color, Urine YELLOW  YELLOW   APPearance CLEAR  CLEAR   Specific Gravity, Urine 1.009  1.005 - 1.030   pH 7.0  5.0 - 8.0   Glucose, UA NEGATIVE  NEGATIVE mg/dL   Hgb urine dipstick NEGATIVE  NEGATIVE   Bilirubin Urine NEGATIVE  NEGATIVE   Ketones, ur NEGATIVE  NEGATIVE mg/dL   Protein, ur NEGATIVE  NEGATIVE mg/dL   Urobilinogen, UA 0.2  0.0 - 1.0 mg/dL   Nitrite NEGATIVE  NEGATIVE   Leukocytes, UA NEGATIVE  NEGATIVE  POCT I-STAT, CHEM 8      Result Value Range   Sodium 136  135 - 145 mEq/L   Potassium 3.7  3.5 - 5.1 mEq/L   Chloride 103  96 - 112 mEq/L   BUN 34 (*) 6 - 23 mg/dL   Creatinine, Ser 4.69 (*) 0.50 - 1.10 mg/dL   Glucose, Bld 629 (*) 70 - 99 mg/dL   Calcium, Ion 5.28  4.13 - 1.30 mmol/L   TCO2 25  0 - 100 mmol/L   Hemoglobin 13.3  12.0 - 15.0 g/dL   HCT 24.4  01.0 - 27.2 %  POCT I-STAT TROPONIN I      Result Value Range   Troponin i, poc 0.00  0.00 - 0.08 ng/mL   Comment 3            Dg Elbow Complete Left  09/06/2012   *RADIOLOGY REPORT*  Clinical Data: Fall, elbow pain  LEFT ELBOW - COMPLETE 3+ VIEW  Comparison: Contralateral flow  Findings: No fracture or dislocation.  No joint effusion.  No aggressive osseous lesions. Bandage overlies the posterior elbow/olecranon.  IMPRESSION: No acute osseous finding of the left elbow.  If clinical concern for a fracture persists, recommend a repeat radiograph in 5-10 days to evaluate for interval change or callus formation.   Original Report Authenticated By: Jearld Lesch, M.D.   Dg Elbow Complete Right  09/06/2012   *RADIOLOGY REPORT*  Clinical Data: Fall, elbow pain and swelling  RIGHT ELBOW - COMPLETE 3+ VIEW  Comparison: Contemporaneous contralateral elbow  Findings: No displaced fracture or dislocation.  No joint effusion. Marked swelling overlies the olecranon.  IMPRESSION: Marked soft tissue swelling overlies the olecranon.  No displaced fracture or dislocation identified.  If clinical concern for a fracture  persists, recommend a repeat radiograph in 5-10 days to evaluate for interval change or callus formation.   Original Report Authenticated By: Jearld Lesch, M.D.   Ct Head Wo Contrast  09/06/2012   *RADIOLOGY REPORT*  Clinical Data: Fall, head trauma.  CT HEAD WITHOUT CONTRAST,CT CERVICAL SPINE WITHOUT CONTRAST  Technique:  Contiguous axial images were obtained from the base of the skull through the vertex without contrast.,Technique: Multidetector CT imaging of the cervical spine was performed. Multiplanar CT image reconstructions were also generated.  Comparison: 08/21/2011  Findings:  Head: Prominence of the sulci, cisterns, and ventricles, in keeping with volume loss. There are subcortical and periventricular white matter hypodensities, a nonspecific finding most often seen with chronic microangiopathic changes.  There is no evidence for acute hemorrhage, overt hydrocephalus, mass lesion, or abnormal extra-axial fluid collection.  No definite CT evidence for acute cortical based (large artery) infarction. The visualized paranasal sinuses and mastoid air cells are predominately clear.  Right posterior scalp hematoma.  No underlying calvarial fracture.  IMPRESSION: Volume loss white matter changes as above.  No CT evidence for acute intracranial abnormality.  Right posterior scalp hematoma.  No underlying calvarial fracture.  C-Spine: Multilevel degenerative changes.  Maintained craniocervical relationship.  No dens fracture.  Grade 1 anterolisthesis of C4 on C5.  Mild C5 vertebral body height loss is similar to prior.  No acute fracture.  Multilevel facet arthropathy.  No dislocation.  Paravertebral soft tissues within normal range.  IMPRESSION: Multilevel degenerative changes.  No acute osseous finding of the cervical spine.   Original Report Authenticated By: Jearld Lesch, M.D.   Ct Cervical Spine Wo Contrast  09/06/2012   *RADIOLOGY REPORT*  Clinical Data: Fall, head trauma.  CT HEAD WITHOUT  CONTRAST,CT CERVICAL SPINE WITHOUT CONTRAST  Technique:  Contiguous axial images were obtained from the base of the skull through the vertex without contrast.,Technique: Multidetector CT imaging of the cervical spine was performed. Multiplanar CT image reconstructions were also generated.  Comparison: 08/21/2011  Findings:  Head: Prominence of the sulci, cisterns, and ventricles, in keeping with volume loss. There are subcortical and periventricular white matter hypodensities, a nonspecific finding most often seen with chronic microangiopathic changes.  There is no evidence for acute hemorrhage, overt hydrocephalus, mass lesion, or abnormal extra-axial fluid collection.  No definite CT evidence for acute cortical based (large artery) infarction. The visualized paranasal sinuses and mastoid air cells are predominately clear.  Right posterior scalp hematoma.  No underlying calvarial fracture.  IMPRESSION: Volume loss white matter changes as above.  No CT evidence for acute intracranial abnormality.  Right posterior scalp hematoma.  No underlying calvarial fracture.  C-Spine: Multilevel degenerative changes.  Maintained craniocervical relationship.  No dens fracture.  Grade 1 anterolisthesis of C4 on C5.  Mild C5 vertebral body height loss is similar to prior.  No acute fracture.  Multilevel facet arthropathy.  No dislocation.  Paravertebral soft tissues within normal range.  IMPRESSION: Multilevel degenerative changes.  No acute osseous finding of the cervical spine.   Original Report Authenticated By: Jearld Lesch, M.D.       Date: 09/05/2012  Rate: 62  Rhythm: paced  QRS Axis: left  Intervals: normal  ST/T Wave abnormalities: nonspecific ST changes  Conduction Disutrbances:nonspecific intraventricular conduction delay  Narrative Interpretation:   Old EKG Reviewed: none available  IV fluids. Patient declines any pain medication. Wound care scalp abrasion, elbow lac  3:10 AM remains symptomatic  with standing, MED consult for admit  MDM  Syncope, head injury on pradaxa  IVFs  Lac repair  ECG, labs, CXR  MED admit        Sunnie Nielsen, MD 09/06/12 0413

## 2012-09-05 NOTE — ED Notes (Addendum)
Per EMS- Pt had a mechanical fall as she was getting out of her Car. Hematoma to head, bleeding controlled. Skin tear to L elbow, hematoma to R elbow. Alertx4, NAD. No deficits at this time. Pt denies HA, Denies LOC on scene.

## 2012-09-06 ENCOUNTER — Encounter (HOSPITAL_COMMUNITY): Payer: Self-pay | Admitting: *Deleted

## 2012-09-06 ENCOUNTER — Emergency Department (HOSPITAL_COMMUNITY): Payer: Medicare Other

## 2012-09-06 DIAGNOSIS — I10 Essential (primary) hypertension: Secondary | ICD-10-CM

## 2012-09-06 DIAGNOSIS — M7989 Other specified soft tissue disorders: Secondary | ICD-10-CM | POA: Diagnosis not present

## 2012-09-06 DIAGNOSIS — J449 Chronic obstructive pulmonary disease, unspecified: Secondary | ICD-10-CM | POA: Diagnosis not present

## 2012-09-06 DIAGNOSIS — S59919A Unspecified injury of unspecified forearm, initial encounter: Secondary | ICD-10-CM | POA: Diagnosis not present

## 2012-09-06 DIAGNOSIS — R55 Syncope and collapse: Secondary | ICD-10-CM | POA: Diagnosis not present

## 2012-09-06 DIAGNOSIS — I4891 Unspecified atrial fibrillation: Secondary | ICD-10-CM

## 2012-09-06 DIAGNOSIS — S1093XA Contusion of unspecified part of neck, initial encounter: Secondary | ICD-10-CM | POA: Diagnosis not present

## 2012-09-06 DIAGNOSIS — J441 Chronic obstructive pulmonary disease with (acute) exacerbation: Secondary | ICD-10-CM

## 2012-09-06 DIAGNOSIS — M25529 Pain in unspecified elbow: Secondary | ICD-10-CM | POA: Diagnosis not present

## 2012-09-06 LAB — COMPREHENSIVE METABOLIC PANEL
ALT: 25 U/L (ref 0–35)
AST: 35 U/L (ref 0–37)
Alkaline Phosphatase: 55 U/L (ref 39–117)
GFR calc Af Amer: 41 mL/min — ABNORMAL LOW (ref >90)
Glucose, Bld: 117 mg/dL — ABNORMAL HIGH (ref 70–99)
Potassium: 3.9 mEq/L (ref 3.5–5.1)
Sodium: 134 mEq/L — ABNORMAL LOW (ref 135–145)
Total Protein: 7 g/dL (ref 6.0–8.3)

## 2012-09-06 LAB — URINALYSIS, ROUTINE W REFLEX MICROSCOPIC
Bilirubin Urine: NEGATIVE
Glucose, UA: NEGATIVE mg/dL
Hgb urine dipstick: NEGATIVE
Specific Gravity, Urine: 1.009 (ref 1.005–1.030)
pH: 7 (ref 5.0–8.0)

## 2012-09-06 LAB — CBC
Hemoglobin: 12.7 g/dL (ref 12.0–15.0)
MCHC: 34.1 g/dL (ref 30.0–36.0)
Platelets: 271 10*3/uL (ref 150–400)
RBC: 3.91 MIL/uL (ref 3.87–5.11)

## 2012-09-06 LAB — POCT I-STAT TROPONIN I: Troponin i, poc: 0 ng/mL (ref 0.00–0.08)

## 2012-09-06 LAB — TROPONIN I: Troponin I: <0.3 ng/mL

## 2012-09-06 LAB — BASIC METABOLIC PANEL
CO2: 25 mEq/L (ref 19–32)
Calcium: 8.7 mg/dL (ref 8.4–10.5)
Creatinine, Ser: 0.96 mg/dL (ref 0.50–1.10)
Glucose, Bld: 91 mg/dL (ref 70–99)

## 2012-09-06 MED ORDER — DILTIAZEM HCL ER COATED BEADS 240 MG PO CP24
240.0000 mg | ORAL_CAPSULE | Freq: Every day | ORAL | Status: DC
  Administered 2012-09-06: 240 mg via ORAL
  Filled 2012-09-06 (×2): qty 1

## 2012-09-06 MED ORDER — FLECAINIDE ACETATE 50 MG PO TABS
150.0000 mg | ORAL_TABLET | Freq: Two times a day (BID) | ORAL | Status: DC
  Administered 2012-09-06 – 2012-09-07 (×3): 150 mg via ORAL
  Filled 2012-09-06 (×4): qty 1

## 2012-09-06 MED ORDER — BUDESONIDE-FORMOTEROL FUMARATE 160-4.5 MCG/ACT IN AERO
2.0000 | INHALATION_SPRAY | Freq: Two times a day (BID) | RESPIRATORY_TRACT | Status: DC
  Administered 2012-09-06 – 2012-09-08 (×5): 2 via RESPIRATORY_TRACT
  Filled 2012-09-06: qty 6

## 2012-09-06 MED ORDER — HYDRALAZINE HCL 20 MG/ML IJ SOLN: 5.0000 mg | INTRAMUSCULAR | Status: DC | PRN

## 2012-09-06 MED ORDER — SODIUM CHLORIDE 0.9 % IJ SOLN
3.0000 mL | Freq: Two times a day (BID) | INTRAMUSCULAR | Status: DC
  Administered 2012-09-06 – 2012-09-08 (×5): 3 mL via INTRAVENOUS

## 2012-09-06 MED ORDER — LEVALBUTEROL TARTRATE 45 MCG/ACT IN AERO: 2.0000 | INHALATION_SPRAY | Freq: Four times a day (QID) | RESPIRATORY_TRACT | Status: DC | PRN

## 2012-09-06 MED ORDER — DABIGATRAN ETEXILATE MESYLATE 150 MG PO CAPS
150.0000 mg | ORAL_CAPSULE | Freq: Two times a day (BID) | ORAL | Status: DC
  Administered 2012-09-06 – 2012-09-08 (×5): 150 mg via ORAL
  Filled 2012-09-06 (×6): qty 1

## 2012-09-06 MED ORDER — MONTELUKAST SODIUM 10 MG PO TABS
10.0000 mg | ORAL_TABLET | Freq: Every day | ORAL | Status: DC
  Administered 2012-09-06 – 2012-09-07 (×2): 10 mg via ORAL
  Filled 2012-09-06 (×3): qty 1

## 2012-09-06 MED ORDER — VITAMIN D3 25 MCG (1000 UNIT) PO TABS
2000.0000 [IU] | ORAL_TABLET | Freq: Every day | ORAL | Status: DC
  Administered 2012-09-06 – 2012-09-08 (×3): 2000 [IU] via ORAL
  Filled 2012-09-06 (×3): qty 2

## 2012-09-06 MED ORDER — FLUTICASONE PROPIONATE 50 MCG/ACT NA SUSP
2.0000 | Freq: Every day | NASAL | Status: DC
  Filled 2012-09-06: qty 16

## 2012-09-06 MED ORDER — ACETAMINOPHEN 325 MG PO TABS
650.0000 mg | ORAL_TABLET | Freq: Four times a day (QID) | ORAL | Status: DC | PRN
  Administered 2012-09-06 – 2012-09-08 (×5): 650 mg via ORAL
  Filled 2012-09-06 (×6): qty 2

## 2012-09-06 MED ORDER — ACETAMINOPHEN 650 MG RE SUPP: 650.0000 mg | Freq: Four times a day (QID) | RECTAL | Status: DC | PRN

## 2012-09-06 MED ORDER — IRBESARTAN 300 MG PO TABS
300.0000 mg | ORAL_TABLET | Freq: Every day | ORAL | Status: DC
  Administered 2012-09-06: 300 mg via ORAL
  Filled 2012-09-06: qty 1

## 2012-09-06 NOTE — Progress Notes (Signed)
UR Completed Aruna Nestler Graves-Bigelow, RN,BSN [phone]  

## 2012-09-06 NOTE — Progress Notes (Addendum)
9:10 AM I agree with HPI/GPe and A/P per Dr. Lafe Garin   Patient states that she's been feeling somewhat dizzy and unsteady for the past couple of weeks. She has not had falls or unilateral weakness. She has a [employer] pacemaker that was placed for tachybradycardia syndrome in the remote past and has not noticed any significant tachycardia. She was at her granddaughter's wedding yesterday and when she was getting out of the car, she felt dizzy and syncopized fell and hit her head. Sustained soft tissue injury to her right elbow but had no acute anomalies on CT cervical spine and CT head without contrast was negative for any acute issue Overall she feels well this morning but is still concerned obviously dizziness  Her primary care physician and is continued what sounds like an ACE inhibitor on her recently   HEENT EOMI CHEST clinically clear CARDIAC S1-S2 paced rhythm no anomalies   Patient Active Problem List   Diagnosis Date Noted  . Syncope 09/06/2012  . COPD with chronic bronchitis 04/26/2010  . ALLERGIC RHINITIS 10/27/2008  . HYPERTENSION 04/19/2008  . ATRIAL FIBRILLATION, PAROXYSMAL 04/19/2008  . GERD 10/29/2007  . BRONCHITIS, CHRONIC, ACUTE EXACERBATION 04/06/2007  . APPENDECTOMY, HX OF 04/06/2007   Chart review Admission 35/9 with Recurrent A fib with RVR and paced-Tachy brady   A/p 1. Potential orthostatic/vasovagal syncope-we will get orthostatic blood pressures. She is on diltiazem 240 and flexion at 150 twice a day as well as irbesartan 300 mg-will discontinue her Avapro and keep her on her blood pressure medicines and get orthostatics- 2. Acute kidney injury she has an increase in her renal insufficiency from 2009 admission this 34/1.3-I suspect this plays a role in her syncopal episode as well.  IVF now held.  Give/Force PO fluids 3. A fibrillation-CHad2Vasc2 score=4-6-Continue Pradaxa 150 bid, flecainide 150 bid, Diltiazem 240 daily-Follow Echo ordered.  [employer] rep to  interrogate PPM 4. Chronic Bronchitis-follwed by Dr. Peri Maris Symbicort 2 puff bid, Flonase 2 spray daily, Levalbuterol 2 puff q 6,Singulair 10 mg daily   Pleas Koch, MD [facility] [phone]

## 2012-09-06 NOTE — Progress Notes (Signed)
  Echocardiogram 2D Echocardiogram has been performed.  Jody Blake Jody Blake 09/06/2012, 1:25 PM

## 2012-09-06 NOTE — ED Notes (Signed)
Antibiotic Ointment applied to wounds.

## 2012-09-06 NOTE — ED Notes (Signed)
Pt remains in Radiology at this time. 

## 2012-09-06 NOTE — H&P (Signed)
[facility] History and Physical  Jody Blake ZOX:[id number] DOB: 08-30-1925 DOA: 09/05/2012  Referring physician: Dr. Dierdre Highman PCP: Jody Otto, MD  Specialists: none  Chief Complaint: syncope  HPI: Jody Blake is a 79 y.o. female has a past medical history significant for hypertension, paroxysmal A. fib on chronic anticoagulation with Pradaxa, s/p pacemaker (cardiologist is Dr. Anne Fu), COPD (pulmonologist is Dr. Maple Hudson), presents with a chief complaint of a syncopal episode last night. She was at her granddaughter's wedding last night - did have 2 glasses of wine - without feeling intoxicated. After driving home when she got out of the car she felt dizzy and fell backwards hitting her head on concrete. No seizure like activity, no post ictal state. She does not feel particularly dehydrated but felt so few days ago. She has been feeling somewhat lightheaded and dizzy, especially if she walks after meals worse in the past few days. Denies chest pain at rest or with exertion. Denies shortness of breath at rest, has some dyspnea with going up a flight of stairs. Denies fever/chills, denies abdominal pain, nausea or vomiting.   Review of Systems: as per HPI, otherwise negative.   Past Medical History  Diagnosis Date  . Esophageal reflux   . Obstructive chronic bronchitis with exacerbation   . Pacemaker   . Rhinitis    Past Surgical History  Procedure Laterality Date  . Partital nephrectomy  1976    stone disease  . Appendectomy    . Foot surgery    . Total knee arthroplasty    . Incisional hernia repair     Social History:  reports that she has never smoked. She does not have any smokeless tobacco history on file. Her alcohol and drug histories are not on file.  Allergies  Allergen Reactions  . Brimonidine Tartrate-Timolol     REACTION: systemic malaise.  [name] Penicillins     Family History  Problem Relation Age of Onset  . Malignant hypertension Father   . Breast  cancer Mother     Prior to Admission medications   Medication Sig Start Date End Date Taking? Authorizing Provider  budesonide-formoterol (SYMBICORT) 160-4.5 MCG/ACT inhaler Inhale 2 puffs into the lungs 2 (two) times daily. Rinse mouth 06/26/12 06/28/13 Yes Waymon Budge, MD  Cholecalciferol (VITAMIN D) 2000 UNITS CAPS Take 1 capsule by mouth daily.     Yes Historical Provider, MD  dabigatran (PRADAXA) 150 MG CAPS Take 150 mg by mouth 2 (two) times daily.    Yes Historical Provider, MD  diltiazem (CARDIZEM CD) 240 MG 24 hr capsule Take 240 mg by mouth daily.   Yes Historical Provider, MD  flecainide (TAMBOCOR) 150 MG tablet Take 150 mg by mouth 2 (two) times daily.    Yes Historical Provider, MD  fluticasone (FLONASE) 50 MCG/ACT nasal spray Place 2 sprays into the nose daily as needed for rhinitis.  09/13/11  Yes Waymon Budge, MD  furosemide (LASIX) 40 MG tablet Take 20 mg by mouth daily. Take 1/2 to 1 by mouth daily   Yes Historical Provider, MD  irbesartan (AVAPRO) 300 MG tablet Take 300 mg by mouth daily.     Yes Historical Provider, MD  levalbuterol [facility] HFA) 45 MCG/ACT inhaler 2 puffs four times daily as needed rescue inhaler 06/26/12  Yes Waymon Budge, MD  Lutein 20 MG CAPS Take 1 capsule by mouth daily.     Yes Historical Provider, MD  montelukast (SINGULAIR) 10 MG tablet Take 1 tablet (10  mg total) by mouth at bedtime. 03/12/12  Yes Waymon Budge, MD  Multiple Vitamin (MULTIVITAMIN) tablet Take 1 tablet by mouth daily.     Yes Historical Provider, MD  Multiple Vitamins-Minerals (PRESERVISION AREDS PO) Take 2 tablets by mouth daily.     Yes Historical Provider, MD  Naproxen Sodium (ALEVE) 220 MG CAPS Take 2 capsules by mouth daily.   Yes Historical Provider, MD  promethazine-codeine (PHENERGAN WITH CODEINE) 6.25-10 MG/5ML syrup Take 5 mLs by mouth every 6 (six) hours as needed for cough. 04/06/12  Yes Waymon Budge, MD   Physical Exam: Filed Vitals:   09/06/12 0130 09/06/12  0215 09/06/12 0300 09/06/12 0345  BP:      Pulse: 59 60 60 61  Temp:      TempSrc:      Resp: 17 16 17 17   SpO2: 100% 97% 97% 97%    General:  No apparent distress  Eyes: PERRL, EOMI, no scleral icterus  HEENT: moist oropharynx; posterior scalp abrasion.   Neck: supple, no JVD  Cardiovascular: regular rate, 3/6 crescendo-decrescendo SEM; 2+ peripheral pulses  Respiratory: CTA biL, good air movement without wheezing, rhonchi or crackled  Abdomen: soft, non tender to palpation, positive bowel sounds, no guarding, no rebound  Skin: no rashes  Musculoskeletal: 1+ peripheral edema. Bilateral elbows with abrasions.   Psychiatric: normal mood and affect  Neurologic: CN 2-12 grossly intact, MS 5/5 in all 4  Labs on Admission:  Basic Metabolic Panel:  Recent Labs Lab 09/05/12 2340 09/05/12 2356  NA 134* 136  K 3.9 3.7  CL 99 103  CO2 25  --   GLUCOSE 117* 114*  BUN 34* 34*  CREATININE 1.34* 1.40*  CALCIUM 9.4  --    Liver Function Tests:  Recent Labs Lab 09/05/12 2340  AST 35  ALT 25  ALKPHOS 55  BILITOT 0.3  PROT 7.0  ALBUMIN 3.9   CBC:  Recent Labs Lab 09/05/12 2340 09/05/12 2356  WBC 10.1  --   HGB 12.7 13.3  HCT 37.2 39.0  MCV 95.1  --   PLT 271  --    Radiological Exams on Admission: Dg Elbow Complete Left  09/06/2012   *RADIOLOGY REPORT*  Clinical Data: Fall, elbow pain  LEFT ELBOW - COMPLETE 3+ VIEW  Comparison: Contralateral flow  Findings: No fracture or dislocation.  No joint effusion.  No aggressive osseous lesions. Bandage overlies the posterior elbow/olecranon.  IMPRESSION: No acute osseous finding of the left elbow.  If clinical concern for a fracture persists, recommend a repeat radiograph in 5-10 days to evaluate for interval change or callus formation.   Original Report Authenticated By: Jearld Lesch, M.D.   Dg Elbow Complete Right  09/06/2012   *RADIOLOGY REPORT*  Clinical Data: Fall, elbow pain and swelling  RIGHT ELBOW -  COMPLETE 3+ VIEW  Comparison: Contemporaneous contralateral elbow  Findings: No displaced fracture or dislocation.  No joint effusion. Marked swelling overlies the olecranon.  IMPRESSION: Marked soft tissue swelling overlies the olecranon.  No displaced fracture or dislocation identified.  If clinical concern for a fracture persists, recommend a repeat radiograph in 5-10 days to evaluate for interval change or callus formation.   Original Report Authenticated By: Jearld Lesch, M.D.   Ct Head Wo Contrast  09/06/2012   *RADIOLOGY REPORT*  Clinical Data: Fall, head trauma.  CT HEAD WITHOUT CONTRAST,CT CERVICAL SPINE WITHOUT CONTRAST  Technique:  Contiguous axial images were obtained from the base of the skull  through the vertex without contrast.,Technique: Multidetector CT imaging of the cervical spine was performed. Multiplanar CT image reconstructions were also generated.  Comparison: 08/21/2011  Findings:  Head: Prominence of the sulci, cisterns, and ventricles, in keeping with volume loss. There are subcortical and periventricular white matter hypodensities, a nonspecific finding most often seen with chronic microangiopathic changes.  There is no evidence for acute hemorrhage, overt hydrocephalus, mass lesion, or abnormal extra-axial fluid collection.  No definite CT evidence for acute cortical based (large artery) infarction. The visualized paranasal sinuses and mastoid air cells are predominately clear.  Right posterior scalp hematoma.  No underlying calvarial fracture.  IMPRESSION: Volume loss white matter changes as above.  No CT evidence for acute intracranial abnormality.  Right posterior scalp hematoma.  No underlying calvarial fracture.  C-Spine: Multilevel degenerative changes.  Maintained craniocervical relationship.  No dens fracture.  Grade 1 anterolisthesis of C4 on C5.  Mild C5 vertebral body height loss is similar to prior.  No acute fracture.  Multilevel facet arthropathy.  No dislocation.   Paravertebral soft tissues within normal range.  IMPRESSION: Multilevel degenerative changes.  No acute osseous finding of the cervical spine.   Original Report Authenticated By: Jearld Lesch, M.D.   Ct Cervical Spine Wo Contrast  09/06/2012   *RADIOLOGY REPORT*  Clinical Data: Fall, head trauma.  CT HEAD WITHOUT CONTRAST,CT CERVICAL SPINE WITHOUT CONTRAST  Technique:  Contiguous axial images were obtained from the base of the skull through the vertex without contrast.,Technique: Multidetector CT imaging of the cervical spine was performed. Multiplanar CT image reconstructions were also generated.  Comparison: 08/21/2011  Findings:  Head: Prominence of the sulci, cisterns, and ventricles, in keeping with volume loss. There are subcortical and periventricular white matter hypodensities, a nonspecific finding most often seen with chronic microangiopathic changes.  There is no evidence for acute hemorrhage, overt hydrocephalus, mass lesion, or abnormal extra-axial fluid collection.  No definite CT evidence for acute cortical based (large artery) infarction. The visualized paranasal sinuses and mastoid air cells are predominately clear.  Right posterior scalp hematoma.  No underlying calvarial fracture.  IMPRESSION: Volume loss white matter changes as above.  No CT evidence for acute intracranial abnormality.  Right posterior scalp hematoma.  No underlying calvarial fracture.  C-Spine: Multilevel degenerative changes.  Maintained craniocervical relationship.  No dens fracture.  Grade 1 anterolisthesis of C4 on C5.  Mild C5 vertebral body height loss is similar to prior.  No acute fracture.  Multilevel facet arthropathy.  No dislocation.  Paravertebral soft tissues within normal range.  IMPRESSION: Multilevel degenerative changes.  No acute osseous finding of the cervical spine.   Original Report Authenticated By: Jearld Lesch, M.D.    EKG: Independently reviewed.  Assessment/Plan Active Problems:    HYPERTENSION   ATRIAL FIBRILLATION, PAROXYSMAL   COPD with chronic bronchitis   Syncope   Syncope - admit telemetry; initial troponin negative, will repeat - 2D echo to evaluate, last echo per patient ~2 years ago.  - received 1L bolus in ED, hold more fluids for now since she has LE edema - encourage po intake, strict I&Os, daily weights.  - CT without ICH  Acute on chronic kidney disease - Cr 1.4, worse than baseline of 0.9 - 1.3. Monitor in am after 1000 cc normal saline - hold lasix for now  HTN - restart home medications now as she is persistently hypertensive. No headaches or chest pain.   A fib - paced rhythm on monitor.  - telemetry -  on Pradaxa, Cardizem, flecainide. - likely need to ask for pacer interrogation in am.   COPD - restarted home medications   DVT Prophylaxis - on Pradaxa  Code Status: Full  Family Communication: daughter bedside  Disposition Plan: observation  Time spent: 40  Saory Carriero M. Elvera Lennox, MD [facility] Pager [phone]  If 7PM-7AM, please contact night-coverage [email or web address] Password [email or web address] 09/06/2012, 3:59 AM

## 2012-09-07 DIAGNOSIS — Z950 Presence of cardiac pacemaker: Secondary | ICD-10-CM | POA: Diagnosis present

## 2012-09-07 DIAGNOSIS — I951 Orthostatic hypotension: Secondary | ICD-10-CM | POA: Diagnosis present

## 2012-09-07 DIAGNOSIS — I4891 Unspecified atrial fibrillation: Secondary | ICD-10-CM | POA: Diagnosis not present

## 2012-09-07 DIAGNOSIS — R55 Syncope and collapse: Secondary | ICD-10-CM | POA: Diagnosis not present

## 2012-09-07 DIAGNOSIS — Z7901 Long term (current) use of anticoagulants: Secondary | ICD-10-CM

## 2012-09-07 DIAGNOSIS — I10 Essential (primary) hypertension: Secondary | ICD-10-CM | POA: Diagnosis not present

## 2012-09-07 LAB — BASIC METABOLIC PANEL
BUN: 18 mg/dL (ref 6–23)
Calcium: 9 mg/dL (ref 8.4–10.5)
Creatinine, Ser: 0.77 mg/dL (ref 0.50–1.10)
GFR calc non Af Amer: 75 mL/min — ABNORMAL LOW (ref >90)
Glucose, Bld: 92 mg/dL (ref 70–99)

## 2012-09-07 LAB — CBC
MCH: 31.6 pg (ref 26.0–34.0)
MCHC: 33.2 g/dL (ref 30.0–36.0)
Platelets: 249 10*3/uL (ref 150–400)

## 2012-09-07 MED ORDER — DILTIAZEM HCL ER COATED BEADS 180 MG PO CP24
180.0000 mg | ORAL_CAPSULE | Freq: Every day | ORAL | Status: DC
  Filled 2012-09-07: qty 1

## 2012-09-07 MED ORDER — FLECAINIDE ACETATE 50 MG PO TABS
50.0000 mg | ORAL_TABLET | Freq: Two times a day (BID) | ORAL | Status: DC
  Administered 2012-09-07 – 2012-09-08 (×2): 50 mg via ORAL
  Filled 2012-09-07 (×3): qty 1

## 2012-09-07 MED ORDER — DILTIAZEM HCL ER COATED BEADS 180 MG PO CP24
180.0000 mg | ORAL_CAPSULE | Freq: Every day | ORAL | Status: DC
  Administered 2012-09-07: 180 mg via ORAL
  Filled 2012-09-07: qty 1

## 2012-09-07 MED ORDER — DILTIAZEM HCL ER COATED BEADS 180 MG PO CP24: 180.0000 mg | ORAL_CAPSULE | Freq: Every day | ORAL | Status: DC

## 2012-09-07 NOTE — Progress Notes (Signed)
PROGRESS NOTE  Jody Blake GNF:[id number] DOB: 12-04-1925 DOA: 09/05/2012 PCP: Ginette Otto, MD  Brief narrative: He 79-year-old female with paroxysmal A. fib on access status post pacer COPD presented with syncopal episode in the setting of having 2 glasses of wine 70 falling and hitting her head subsequently and has been somewhat dehydrated. Workup thus far reveals that she is orthostatic and is on afebrile as well as diltiazem, both of which have been discontinued  Consultants:  Cardiology-Dr. Chales Abrahams  Procedures:  Elbow x-ray 6/2 show soft tissue swelling overlying acromion c process  CT spine and head were -6/2  Antibiotics:  None   Subjective  Doing fair. Still very dizzy this morning when she stood up. No nausea no vomiting Tolerating diet.   Objective    Interim History:   Telemetry: A. fib  Objective: Filed Vitals:   09/07/12 0501 09/07/12 0503 09/07/12 0506 09/07/12 1323  BP: 156/73 133/65 137/66 135/70  Pulse: 61 62 66 60  Temp:    98.3 F (36.8 C)  TempSrc:    Oral  Resp:    18  Height:      Weight:      SpO2:    97%    Intake/Output Summary (Last 24 hours) at 09/07/12 1359 Last data filed at 09/07/12 1300  Gross per 24 hour  Intake   1080 ml  Output      0 ml  Net   1080 ml    Exam:  General: Alert oriented pleasant no apparent distress Cardiovascular: S1-S2 regular rate rhythm Respiratory: Clear Abdomen: Soft nontender Skin no lower extremity pitting edema NeuroGrossly intact  Data Reviewed: Basic Metabolic Panel:  Recent Labs Lab 09/05/12 2340 09/05/12 2356 09/06/12 0737 09/07/12 0505  NA 134* 136 136 137  K 3.9 3.7 3.8 3.7  CL 99 103 103 104  CO2 25  --  25 27  GLUCOSE 117* 114* 91 92  BUN 34* 34* 27* 18  CREATININE 1.34* 1.40* 0.96 0.77  CALCIUM 9.4  --  8.7 9.0   Liver Function Tests:  Recent Labs Lab 09/05/12 2340  AST 35  ALT 25  ALKPHOS 55  BILITOT 0.3  PROT 7.0  ALBUMIN 3.9   No results found  for this basename: LIPASE, AMYLASE,  in the last 168 hours No results found for this basename: AMMONIA,  in the last 168 hours CBC:  Recent Labs Lab 09/05/12 2340 09/05/12 2356 09/07/12 0505  WBC 10.1  --  6.3  HGB 12.7 13.3 11.8*  HCT 37.2 39.0 35.5*  MCV 95.1  --  94.9  PLT 271  --  249   Cardiac Enzymes:  Recent Labs Lab 09/06/12 0730  TROPONINI <0.30   BNP: No components found with this basename: POCBNP,  CBG: No results found for this basename: GLUCAP,  in the last 168 hours  No results found for this or any previous visit (from the past 240 hour(s)).   Studies:              All Imaging reviewed and is as per above notation   Scheduled Meds: . budesonide-formoterol  2 puff Inhalation BID  . cholecalciferol  2,000 Units Oral Daily  . dabigatran  150 mg Oral BID  . flecainide  50 mg Oral BID  . fluticasone  2 spray Each Nare Daily  . montelukast  10 mg Oral QHS  . sodium chloride  3 mL Intravenous Q12H   Continuous Infusions:    Assessment/Plan: A/p  1. likely orthostatic/vasovagal syncope-d/c Avapro.  Tried to wean Caridzem to 180 dose 6/2 am, still orthostatic.  Spoke with her Cardiologist on call Dr. Anne Fu -he has seen her and recommends only flecainide 50 bid, and to get Flecainde level 6/57m.  Monitor on tele 2. Acute kidney injury she has an increase in her renal insufficiency from 2009 admission this 34/1.3-I suspect this plays a role in her syncopal episode as well. IVF now held. Give/Force PO fluids-Improving\ 3. Afibrillation-CHad2Vasc2 score=4-6-Continue Pradaxa 150 bid, flecainide 50 bid-See below for 3echo. [employer] rep to interrogated PPM which showed no specific issu3 4. Chronic Bronchitis-follwed by Dr. Peri Maris Symbicort 2 puff bid, Flonase 2 spray daily, Levalbuterol 2 puff q 6,Singulair 10 mg daily 5. Compensated CHF with EF 55-60% with mod AoV stenosis.  Pleas Koch, MD [facility] [phone] 09/07/2012, 1:59 PM    LOS: 2  days

## 2012-09-07 NOTE — Consult Note (Signed)
Admit date: 09/05/2012 Referring Physician  Dr. Mahala Menghini Primary Physician Ginette Otto, MD Primary Cardiologist  [facility] Reason for Consultation  Syncope  HPI: 79 year old with syncopal episode following her granddaughter's wedding with paroxysmal atrial fibrillation, orthostatic hypotension, chronic anticoagulation, pacemaker, moderate aortic stenosis, normal ejection fraction. She is still feeling somewhat dizzy when standing and demonstrate orthostatic hypotension. Because of this, many of her antihypertensives have been discontinued/held. Avapro, ACE inhibitor, diltiazem. She remains in sinus rhythm. Her pacemaker interrogation was normal. She states that she hit the back of her head but remains neurologically intact. She felt poor the entire day prior to her syncopal episode which was moderate to severe in severity, alleviated with time, hydration. No associated chest pain, no associated shortness of breath or palpitations. Her pacemaker did not demonstrate rapid atrial fibrillation during this episode. She has had a previous fall in the past. Dr. Pete Glatter, her primary physician, has pulled off the ACE inhibitor recently.    PMH:   Past Medical History  Diagnosis Date  . Esophageal reflux   . Obstructive chronic bronchitis with exacerbation   . Pacemaker   . Rhinitis     PSH:   Past Surgical History  Procedure Laterality Date  . Partital nephrectomy  1976    stone disease  . Appendectomy    . Foot surgery    . Total knee arthroplasty    . Incisional hernia repair     Allergies:  Brimonidine tartrate-timolol and Penicillins Prior to Admit Meds:   Prescriptions prior to admission  Medication Sig Dispense Refill  . budesonide-formoterol (SYMBICORT) 160-4.5 MCG/ACT inhaler Inhale 2 puffs into the lungs 2 (two) times daily. Rinse mouth  30.6 g  3  . Cholecalciferol (VITAMIN D) 2000 UNITS CAPS Take 1 capsule by mouth daily.        . dabigatran (PRADAXA) 150 MG CAPS Take 150 mg  by mouth 2 (two) times daily.       [name] diltiazem (CARDIZEM CD) 240 MG 24 hr capsule Take 240 mg by mouth daily.      . flecainide (TAMBOCOR) 150 MG tablet Take 150 mg by mouth 2 (two) times daily.       . fluticasone (FLONASE) 50 MCG/ACT nasal spray Place 2 sprays into the nose daily as needed for rhinitis.       . furosemide (LASIX) 40 MG tablet Take 20 mg by mouth daily. Take 1/2 to 1 by mouth daily      . irbesartan (AVAPRO) 300 MG tablet Take 300 mg by mouth daily.        [name] levalbuterol (XOPENEX HFA) 45 MCG/ACT inhaler 2 puffs four times daily as needed rescue inhaler  3 Inhaler  3  . Lutein 20 MG CAPS Take 1 capsule by mouth daily.        . montelukast (SINGULAIR) 10 MG tablet Take 1 tablet (10 mg total) by mouth at bedtime.  90 tablet  1  . Multiple Vitamin (MULTIVITAMIN) tablet Take 1 tablet by mouth daily.        . Multiple Vitamins-Minerals (PRESERVISION AREDS PO) Take 2 tablets by mouth daily.        . Naproxen Sodium (ALEVE) 220 MG CAPS Take 2 capsules by mouth daily.      . promethazine-codeine (PHENERGAN WITH CODEINE) 6.25-10 MG/5ML syrup Take 5 mLs by mouth every 6 (six) hours as needed for cough.  200 mL  0   Fam HX:    Family History  Problem Relation Age of Onset  .  Malignant hypertension Father   . Breast cancer Mother    Social HX:    History   Social History  . Marital Status: Married    Spouse Name: N/A    Number of Children: N/A  . Years of Education: N/A   Occupational History  . retired    Social History Main Topics  . Smoking status: Never Smoker   . Smokeless tobacco: Not on file  . Alcohol Use: Not on file  . Drug Use: Not on file  . Sexually Active: Not on file   Other Topics Concern  . Not on file   Social History Narrative  . No narrative on file     ROS:  Multiple skin excoriations, bruising, ecchymosis, denies any strokelike symptoms, fevers, bleeding other than ecchymosis, no vomiting, no chest pain, no rashes. All 11 ROS were addressed  and are negative except what is stated in the HPI  Physical Exam: Blood pressure 137/66, pulse 66, temperature 98.5 F (36.9 C), temperature source Oral, resp. rate 18, height 5\' 5"  (1.651 m), weight 61 kg (134 lb 7.7 oz), SpO2 97.00%.    General: Well developed, well nourished, in no acute distress, looks younger than stated age.  Head: Eyes PERRLA, No xanthomas.   Normal cephalic and atramatic Lungs:   Clear bilaterally to auscultation and percussion. Normal respiratory effort. No wheezes, no rales. Heart:   HRRR S1 S2 Pulses are 2+ & equal. Pacer intact, 3/6 systolic crescendo/decrescendo murmur at the right upper sternal border with radiation to her carotidst. No JVD.  No abdominal bruits.  Abdomen: Bowel sounds are positive, abdomen soft and non-tender without masses. No hepatosplenomegaly. Msk:  Back normal. Normal strength and tone for age. Extremities:   No clubbing, cyanosis or edema.  DP +1, right olecranon bandaged with significant ecchymosis/soft tissue swelling. Neuro: Alert and oriented X 3, non-focal, MAE x 4 GU: Deferred Rectal: Deferred Psych:  Good affect, responds appropriately    Labs:   Lab Results  Component Value Date   WBC 6.3 09/07/2012   HGB 11.8* 09/07/2012   HCT 35.5* 09/07/2012   MCV 94.9 09/07/2012   PLT 249 09/07/2012    Recent Labs Lab 09/05/12 2340  09/07/12 0505  NA 134*  < > 137  K 3.9  < > 3.7  CL 99  < > 104  CO2 25  < > 27  BUN 34*  < > 18  CREATININE 1.34*  < > 0.77  CALCIUM 9.4  < > 9.0  PROT 7.0  --   --   BILITOT 0.3  --   --   ALKPHOS 55  --   --   ALT 25  --   --   AST 35  --   --   GLUCOSE 117*  < > 92  < > = values in this interval not displayed. No results found for this basename: PTT   Lab Results  Component Value Date   INR 1.1 06/12/2007   INR 1.0 06/11/2007   Lab Results  Component Value Date   CKTOTAL 45 06/11/2007   CKMB 2.3 06/11/2007   TROPONINI <0.30 09/06/2012      Radiology:  Soft tissue swelling around the olecranon.  No fracture.  EKG:  Atrial pacing, narrow complex QRS, left anterior fascicular block, no change from prior. Personally viewed.   Echocardiogram: - Left ventricle: The cavity size was normal. There was mild concentric hypertrophy. Systolic function was normal. The estimated ejection fraction was  in the range of 55% to 60%. Wall motion was normal; there were no regional wall motion abnormalities. Features are consistent with a pseudonormal left ventricular filling pattern, with concomitant abnormal relaxation and increased filling pressure (grade 2 diastolic dysfunction). - Aortic valve: Moderate diffuse thickening and calcification, consistent with sclerosis. Valve mobility was moderately restricted. There was moderate stenosis. Mild regurgitation. Valve area: 1.19cm^2(VTI). Valve area: 1.04cm^2 (Vmax). - Mitral valve: Calcified annulus. - Left atrium: The atrium was mildly dilated. - Right ventricle: The cavity size was mildly dilated. Wall thickness was normal. - Right atrium: The atrium was mildly to moderately dilated. - Tricuspid valve: Moderate regurgitation. - Pulmonary arteries: PA peak pressure: 41mm Hg (S). Impressions:  - The right ventricular systolic pressure was increased consistent with mild pulmonary hypertension.   ASSESSMENT/PLAN:   79 year old with significant orthostatic hypotension, recent syncopal episode, moderate aortic stenosis, pacemaker, paroxysmal atrial fibrillation on chronic flecainide, chronic anticoagulation, chronic kidney disease, hypertension.  1. Syncope-appears that she has been significantly orthostatic. I completely agree with discontinuation of several of her blood pressure medications. Recently, Dr. Pete Glatter had discontinued her Aceon (ACE inhibitor). Here her Avapro has been discontinued as well as her diltiazem. She is maintaining sinus rhythm. Remains orthostatic. Continue to monitor. Promote hydration. I stated to her that we are  willing to tolerate increases in her baseline blood pressure because of her orthostasis/autonomic dysfunction. Support hose would be helpful. Next step would be to possibly add Midrin.  2. Paroxysmal atrial fibrillation-very rare episodes detected on pacemaker interrogations of 3-4 minutes. I reviewed her medical records and in 2011, Dr. Amil Amen, her prior cardiologist, increased her dosage of flecainide from 100 mg twice a day up to 150 mg twice a day and stated that she had been on this dosage, higher dosage for several years without any difficulties. Given her age, decreasing creatinine clearance, I would like to decrease her flecainide to 50 mg twice a day. Of course, we may see increase in her paroxysmal atrial fibrillation. If that is the case, we may need to go back to 100 mg twice a day at least. It would be nice for her to be on concomitant AV nodal blocking agent to avoid the possibility of 1:1 conduction, however for now given her severe orthostasis, diltiazem is being held. I would like for her to be on low-dose diltiazem, 120 mg CD, when possible.  3. Chronic anticoagulation-bruises noted, elbow. Significant fall. No catostophic bleeding however. Her Pradaxa is being continued.  4. Orthostatic hypotension-as above.  5. Pacemaker-interrogation, functioning properly. Telemetry personally reviewed.  Donato Schultz, MD  09/07/2012  1:21 PM

## 2012-09-08 MED ORDER — DILTIAZEM HCL ER COATED BEADS 120 MG PO CP24: 120.0000 mg | ORAL_CAPSULE | Freq: Every day | ORAL | Status: DC

## 2012-09-08 MED ORDER — FLECAINIDE ACETATE 50 MG PO TABS: 50.0000 mg | ORAL_TABLET | Freq: Two times a day (BID) | ORAL | Status: DC

## 2012-09-08 NOTE — Discharge Summary (Signed)
Physician Discharge Summary  BEYA TIPPS XBJ:[id number] DOB: 20-Oct-1925 DOA: 09/05/2012  PCP: Ginette Otto, MD  Admit date: 09/05/2012 Discharge date: 09/08/2012  Time spent: 35 minutes  Recommendations for Outpatient Follow-up:  1. Needs outpatient titration of blood pressure medications-had likely orthostatic blood pressure changes on admission 2. Followup with regular physician in about a week 3. Potential he needs followup from cardiology standpoint for moderate aortic stenosis 4. Careful consideration to be given as an outpatient regarding productive   Discharge Diagnoses:  Active Problems:   HYPERTENSION   ATRIAL FIBRILLATION, PAROXYSMAL   COPD with chronic bronchitis   Syncope   Cardiac pacemaker in situ   Long term (current) use of anticoagulants   Orthostatic hypotension   Discharge Condition: Good  Diet recommendation: At healthy-increased amount of salt and diet a little bit  Filed Weights   09/06/12 0528 09/07/12 0500  Weight: 60.328 kg (133 lb) 61 kg (134 lb 7.7 oz)    History of present illness:  79 year old female with paroxysmal A. fib on access status post pacer COPD presented with syncopal episode in the setting of having 2 glasses of wine 70 falling and hitting her head subsequently and has been somewhat dehydrated.  She sustained some traumatic bruises to her head and her right elbow without any fractures  She was significantly orthostatic and is on Avapro as well as diltiazem, both of which have been discontinued Cardiology was consulted regarding her care and an echocardiogram performed showed moderate aortic stenosis which was thought to be non-contributory to her current fall Her blood pressures rebounded upwards by been taken off of all medications for blood pressure Discussion with cardiologist Dr. Anne Fu led to the decision of restarting diltiazem 120 mg extended release once daily and have patient followup with either Dr. Pete Glatter or Dr.  Jayme Cloud in the outpatient setting Patient was ambulated in the hallway on day of discharge and did not have any orthostasis and felt at baseline Patient is to follow up with primary care physician in about a week to determine further changes to medication  Consultants:  Cardiology-Dr. Chales Abrahams Procedures:  Elbow x-ray 6/2 show soft tissue swelling overlying acromion c process  CT spine and head were -6/2 Echocardiogram 6/1  = 55-60%, grade 2 diastolic dysfunction, AV valve 1.19 cm Antibiotics:  None  Discharge Exam: Filed Vitals:   09/07/12 2034 09/07/12 2142 09/08/12 0557 09/08/12 0848  BP:  156/62 169/75   Pulse:  66 62   Temp:  97.9 F (36.6 C) 98.6 F (37 C)   TempSrc:  Oral Oral   Resp:  18 18   Height:      Weight:      SpO2: 97% 100% 98% 95%   Her pleasant oriented no apparent distress  General: NAD Cardiovascular: S1-S2, paced rhythm Respiratory: Clinically clear  Discharge Instructions  Discharge Orders   Future Appointments Provider Department Dept Phone   02/09/2013 3:45 PM Alison Murray, MD [facility] [phone]   06/28/2013 10:00 AM Waymon Budge, MD [facility] [phone]   Future Orders Complete By Expires     Call MD for:  hives  As directed     Call MD for:  persistant dizziness or light-headedness  As directed     Call MD for:  redness, tenderness, or signs of infection (pain, swelling, redness, odor or green/yellow discharge around incision site)  As directed     Call MD for:  temperature >100.4  As directed  Diet - low sodium heart healthy  As directed     Discharge instructions  As directed     Comments:      Your blood pressure medications and yard atrial fibrillation medications have been adjusted-please followup and instructions and to see Dr. Pete Glatter or Dr. Anne Fu Get some labwork at your regular doctor's office in about a week Careful with ambulation and activity for the next week until seen by ER  physician-please do not attempt any exercise classes until the    Increase activity slowly  As directed         Medication List    STOP taking these medications       irbesartan 300 MG tablet  Commonly known as:  AVAPRO     Vitamin D 2000 UNITS Caps      TAKE these medications       ALEVE 220 MG Caps  Generic drug:  Naproxen Sodium  Take 2 capsules by mouth daily.     budesonide-formoterol 160-4.5 MCG/ACT inhaler  Commonly known as:  SYMBICORT  Inhale 2 puffs into the lungs 2 (two) times daily. Rinse mouth     diltiazem 120 MG 24 hr capsule  Commonly known as:  CARDIZEM CD  Take 1 capsule (120 mg total) by mouth daily.     flecainide 50 MG tablet  Commonly known as:  TAMBOCOR  Take 1 tablet (50 mg total) by mouth 2 (two) times daily.     fluticasone 50 MCG/ACT nasal spray  Commonly known as:  FLONASE  Place 2 sprays into the nose daily as needed for rhinitis.     furosemide 40 MG tablet  Commonly known as:  LASIX  Take 20 mg by mouth daily. Take 1/2 to 1 by mouth daily     levalbuterol 45 MCG/ACT inhaler  Commonly known as:  XOPENEX HFA  2 puffs four times daily as needed rescue inhaler     Lutein 20 MG Caps  Take 1 capsule by mouth daily.     montelukast 10 MG tablet  Commonly known as:  SINGULAIR  Take 1 tablet (10 mg total) by mouth at bedtime.     multivitamin tablet  Take 1 tablet by mouth daily.     PRADAXA 150 MG Caps  Generic drug:  dabigatran  Take 150 mg by mouth 2 (two) times daily.     PRESERVISION AREDS PO  Take 2 tablets by mouth daily.     promethazine-codeine 6.25-10 MG/5ML syrup  Commonly known as:  PHENERGAN with CODEINE  Take 5 mLs by mouth every 6 (six) hours as needed for cough.       Allergies  Allergen Reactions  . Brimonidine Tartrate-Timolol     REACTION: systemic malaise.  [name] Penicillins       The results of significant diagnostics from this hospitalization (including imaging, microbiology, ancillary and laboratory)  are listed below for reference.    Significant Diagnostic Studies: Dg Elbow Complete Left  27-Sep-2012   *RADIOLOGY REPORT*  Clinical Data: Fall, elbow pain  LEFT ELBOW - COMPLETE 3+ VIEW  Comparison: Contralateral flow  Findings: No fracture or dislocation.  No joint effusion.  No aggressive osseous lesions. Bandage overlies the posterior elbow/olecranon.  IMPRESSION: No acute osseous finding of the left elbow.  If clinical concern for a fracture persists, recommend a repeat radiograph in 5-10 days to evaluate for interval change or callus formation.   Original Report Authenticated By: Jearld Lesch, M.D.   Dg Elbow Complete  Right  09/06/2012   *RADIOLOGY REPORT*  Clinical Data: Fall, elbow pain and swelling  RIGHT ELBOW - COMPLETE 3+ VIEW  Comparison: Contemporaneous contralateral elbow  Findings: No displaced fracture or dislocation.  No joint effusion. Marked swelling overlies the olecranon.  IMPRESSION: Marked soft tissue swelling overlies the olecranon.  No displaced fracture or dislocation identified.  If clinical concern for a fracture persists, recommend a repeat radiograph in 5-10 days to evaluate for interval change or callus formation.   Original Report Authenticated By: Jearld Lesch, M.D.   Ct Head Wo Contrast  09/06/2012   *RADIOLOGY REPORT*  Clinical Data: Fall, head trauma.  CT HEAD WITHOUT CONTRAST,CT CERVICAL SPINE WITHOUT CONTRAST  Technique:  Contiguous axial images were obtained from the base of the skull through the vertex without contrast.,Technique: Multidetector CT imaging of the cervical spine was performed. Multiplanar CT image reconstructions were also generated.  Comparison: 08/21/2011  Findings:  Head: Prominence of the sulci, cisterns, and ventricles, in keeping with volume loss. There are subcortical and periventricular white matter hypodensities, a nonspecific finding most often seen with chronic microangiopathic changes.  There is no evidence for acute hemorrhage, overt  hydrocephalus, mass lesion, or abnormal extra-axial fluid collection.  No definite CT evidence for acute cortical based (large artery) infarction. The visualized paranasal sinuses and mastoid air cells are predominately clear.  Right posterior scalp hematoma.  No underlying calvarial fracture.  IMPRESSION: Volume loss white matter changes as above.  No CT evidence for acute intracranial abnormality.  Right posterior scalp hematoma.  No underlying calvarial fracture.  C-Spine: Multilevel degenerative changes.  Maintained craniocervical relationship.  No dens fracture.  Grade 1 anterolisthesis of C4 on C5.  Mild C5 vertebral body height loss is similar to prior.  No acute fracture.  Multilevel facet arthropathy.  No dislocation.  Paravertebral soft tissues within normal range.  IMPRESSION: Multilevel degenerative changes.  No acute osseous finding of the cervical spine.   Original Report Authenticated By: Jearld Lesch, M.D.   Ct Cervical Spine Wo Contrast  09/06/2012   *RADIOLOGY REPORT*  Clinical Data: Fall, head trauma.  CT HEAD WITHOUT CONTRAST,CT CERVICAL SPINE WITHOUT CONTRAST  Technique:  Contiguous axial images were obtained from the base of the skull through the vertex without contrast.,Technique: Multidetector CT imaging of the cervical spine was performed. Multiplanar CT image reconstructions were also generated.  Comparison: 08/21/2011  Findings:  Head: Prominence of the sulci, cisterns, and ventricles, in keeping with volume loss. There are subcortical and periventricular white matter hypodensities, a nonspecific finding most often seen with chronic microangiopathic changes.  There is no evidence for acute hemorrhage, overt hydrocephalus, mass lesion, or abnormal extra-axial fluid collection.  No definite CT evidence for acute cortical based (large artery) infarction. The visualized paranasal sinuses and mastoid air cells are predominately clear.  Right posterior scalp hematoma.  No underlying  calvarial fracture.  IMPRESSION: Volume loss white matter changes as above.  No CT evidence for acute intracranial abnormality.  Right posterior scalp hematoma.  No underlying calvarial fracture.  C-Spine: Multilevel degenerative changes.  Maintained craniocervical relationship.  No dens fracture.  Grade 1 anterolisthesis of C4 on C5.  Mild C5 vertebral body height loss is similar to prior.  No acute fracture.  Multilevel facet arthropathy.  No dislocation.  Paravertebral soft tissues within normal range.  IMPRESSION: Multilevel degenerative changes.  No acute osseous finding of the cervical spine.   Original Report Authenticated By: Jearld Lesch, M.D.    Microbiology: No  results found for this or any previous visit (from the past 240 hour(s)).   Labs: Basic Metabolic Panel:  Recent Labs Lab 09/05/12 2340 09/05/12 2356 09/06/12 0737 09/07/12 0505  NA 134* 136 136 137  K 3.9 3.7 3.8 3.7  CL 99 103 103 104  CO2 25  --  25 27  GLUCOSE 117* 114* 91 92  BUN 34* 34* 27* 18  CREATININE 1.34* 1.40* 0.96 0.77  CALCIUM 9.4  --  8.7 9.0   Liver Function Tests:  Recent Labs Lab 09/05/12 2340  AST 35  ALT 25  ALKPHOS 55  BILITOT 0.3  PROT 7.0  ALBUMIN 3.9   No results found for this basename: LIPASE, AMYLASE,  in the last 168 hours No results found for this basename: AMMONIA,  in the last 168 hours CBC:  Recent Labs Lab 09/05/12 2340 09/05/12 2356 09/07/12 0505  WBC 10.1  --  6.3  HGB 12.7 13.3 11.8*  HCT 37.2 39.0 35.5*  MCV 95.1  --  94.9  PLT 271  --  249   Cardiac Enzymes:  Recent Labs Lab 09/06/12 0730  TROPONINI <0.30   BNP: BNP (last 3 results) No results found for this basename: PROBNP,  in the last 8760 hours CBG: No results found for this basename: GLUCAP,  in the last 168 hours     Signed:  Rhetta Mura  [facility] 09/08/2012, 11:49 AM

## 2012-09-08 NOTE — Evaluation (Addendum)
Physical Therapy Evaluation Patient Details Name: Jody Blake MRN: [id number] DOB: 23-Dec-1925 Today's Date: 09/08/2012 Time: 1308-6578 PT Time Calculation (min): 13 min  PT Assessment / Plan / Recommendation Clinical Impression  Upon evaluation, Pt presents with no acute PT needs. Pt is independent. PT will sign off.    PT Assessment  Patent does not need any further PT services    Follow Up Recommendations  No PT follow up          Equipment Recommendations  None recommended by PT    Recommendations for Other Services     Frequency      Precautions / Restrictions     Pertinent Vitals/Pain Pt reports no pain at this time      Mobility  Bed Mobility Bed Mobility: Supine to Sit;Sitting - Scoot to Edge of Bed Supine to Sit: 7: Independent Sitting - Scoot to Edge of Bed: 7: Independent Details for Bed Mobility Assistance: no assist needed Transfers Transfers: Sit to Stand;Stand to Sit Sit to Stand: 7: Independent Stand to Sit: 7: Independent Details for Transfer Assistance: No assist needed Ambulation/Gait Ambulation/Gait Assistance: 5: Supervision Ambulation Distance (Feet): 300 Feet Assistive device: None Ambulation/Gait Assistance Details: steady with gait Gait Pattern: Within Functional Limits Gait velocity: decreased General Gait Details: overall steady with some decreased velocity      Visit Information  Last PT Received On: 09/08/12 Assistance Needed: +1    Subjective Data  Subjective: I dont want any equipment Patient Stated Goal: to go home   Prior Functioning  Home Living Lives With: Spouse Available Help at Discharge: Family Type of Home: Independent living facility Home Access: Level entry;Elevator Home Layout: One level Bathroom Shower/Tub: Health visitor: Handicapped height Home Adaptive Equipment: None;Built-in shower seat Prior Function Level of Independence: Independent Able to Take Stairs?: Yes Driving:  Yes Vocation: Retired Musician: No difficulties Dominant Hand: Right    Cognition  Cognition Arousal/Alertness: Awake/alert Behavior During Therapy: WFL for tasks assessed/performed Overall Cognitive Status: Within Functional Limits for tasks assessed    Extremity/Trunk Assessment Right Upper Extremity Assessment RUE ROM/Strength/Tone: [facility] for tasks assessed Left Upper Extremity Assessment LUE ROM/Strength/Tone: WFL for tasks assessed Right Lower Extremity Assessment RLE ROM/Strength/Tone: [facility] for tasks assessed Left Lower Extremity Assessment LLE ROM/Strength/Tone: WFL for tasks assessed   Balance Balance Balance Assessed: Yes High Level Balance High Level Balance Activites: Side stepping;Backward walking;Direction changes;Turns;Sudden stops;Head turns High Level Balance Comments: steady with activity  End of Session PT - End of Session Equipment Utilized During Treatment: Gait belt Activity Tolerance: Patient tolerated treatment well Patient left: in chair;with call bell/phone within reach Nurse Communication: Mobility status  GP Functional Assessment Tool Used: clinical judgement Functional Limitation: Mobility: Walking and moving around Mobility: Walking and Moving Around Current Status ([id number]): At least 1 percent but less than 20 percent impaired, limited or restricted Mobility: Walking and Moving Around Goal Status [id number]): At least 1 percent but less than 20 percent impaired, limited or restricted Mobility: Walking and Moving Around Discharge Status [phone]): At least 1 percent but less than 20 percent impaired, limited or restricted   Fabio Asa 09/08/2012, 3:38 PM Charlotte Crumb, PT DPT  [phone]

## 2012-09-08 NOTE — Progress Notes (Signed)
Dc instructions given to patient at this time.  Pt verbalized understanding of all instructions.  No s/s of any acute distress at dc.

## 2012-09-10 DIAGNOSIS — C44721 Squamous cell carcinoma of skin of unspecified lower limb, including hip: Secondary | ICD-10-CM | POA: Diagnosis not present

## 2012-09-13 LAB — FLECAINIDE LEVEL: Flecainide: 1.07 ug/mL (ref 0.20–1.00)

## 2012-09-15 DIAGNOSIS — I129 Hypertensive chronic kidney disease with stage 1 through stage 4 chronic kidney disease, or unspecified chronic kidney disease: Secondary | ICD-10-CM | POA: Diagnosis not present

## 2012-09-15 DIAGNOSIS — R55 Syncope and collapse: Secondary | ICD-10-CM | POA: Diagnosis not present

## 2012-09-15 DIAGNOSIS — Z79899 Other long term (current) drug therapy: Secondary | ICD-10-CM | POA: Diagnosis not present

## 2012-09-17 DIAGNOSIS — Z79899 Other long term (current) drug therapy: Secondary | ICD-10-CM | POA: Diagnosis not present

## 2012-09-25 DIAGNOSIS — Z79899 Other long term (current) drug therapy: Secondary | ICD-10-CM | POA: Diagnosis not present

## 2012-09-25 DIAGNOSIS — I4891 Unspecified atrial fibrillation: Secondary | ICD-10-CM | POA: Diagnosis not present

## 2012-09-25 DIAGNOSIS — I495 Sick sinus syndrome: Secondary | ICD-10-CM | POA: Diagnosis not present

## 2012-09-25 DIAGNOSIS — Z7901 Long term (current) use of anticoagulants: Secondary | ICD-10-CM | POA: Diagnosis not present

## 2012-09-25 DIAGNOSIS — I5031 Acute diastolic (congestive) heart failure: Secondary | ICD-10-CM | POA: Diagnosis not present

## 2012-09-25 DIAGNOSIS — Z950 Presence of cardiac pacemaker: Secondary | ICD-10-CM | POA: Diagnosis not present

## 2012-10-05 DIAGNOSIS — I4891 Unspecified atrial fibrillation: Secondary | ICD-10-CM | POA: Diagnosis not present

## 2012-10-05 DIAGNOSIS — Z950 Presence of cardiac pacemaker: Secondary | ICD-10-CM | POA: Diagnosis not present

## 2012-10-05 DIAGNOSIS — I359 Nonrheumatic aortic valve disorder, unspecified: Secondary | ICD-10-CM | POA: Diagnosis not present

## 2012-10-08 DIAGNOSIS — Z7901 Long term (current) use of anticoagulants: Secondary | ICD-10-CM | POA: Diagnosis not present

## 2012-10-08 DIAGNOSIS — R079 Chest pain, unspecified: Secondary | ICD-10-CM | POA: Diagnosis not present

## 2012-10-08 DIAGNOSIS — I4891 Unspecified atrial fibrillation: Secondary | ICD-10-CM | POA: Diagnosis not present

## 2012-10-08 DIAGNOSIS — Z950 Presence of cardiac pacemaker: Secondary | ICD-10-CM | POA: Diagnosis not present

## 2012-10-12 ENCOUNTER — Other Ambulatory Visit: Payer: Self-pay | Admitting: Internal Medicine

## 2012-10-16 DIAGNOSIS — R079 Chest pain, unspecified: Secondary | ICD-10-CM | POA: Diagnosis not present

## 2012-10-16 DIAGNOSIS — Z950 Presence of cardiac pacemaker: Secondary | ICD-10-CM | POA: Diagnosis not present

## 2012-10-16 DIAGNOSIS — I4891 Unspecified atrial fibrillation: Secondary | ICD-10-CM | POA: Diagnosis not present

## 2012-10-16 DIAGNOSIS — Z7901 Long term (current) use of anticoagulants: Secondary | ICD-10-CM | POA: Diagnosis not present

## 2012-10-19 DIAGNOSIS — I4891 Unspecified atrial fibrillation: Secondary | ICD-10-CM | POA: Diagnosis not present

## 2012-10-27 DIAGNOSIS — Z950 Presence of cardiac pacemaker: Secondary | ICD-10-CM | POA: Diagnosis not present

## 2012-10-27 DIAGNOSIS — I4891 Unspecified atrial fibrillation: Secondary | ICD-10-CM | POA: Diagnosis not present

## 2012-11-06 DIAGNOSIS — I495 Sick sinus syndrome: Secondary | ICD-10-CM | POA: Diagnosis not present

## 2012-11-06 DIAGNOSIS — Z950 Presence of cardiac pacemaker: Secondary | ICD-10-CM | POA: Diagnosis not present

## 2012-11-24 DIAGNOSIS — M25529 Pain in unspecified elbow: Secondary | ICD-10-CM | POA: Diagnosis not present

## 2012-11-24 DIAGNOSIS — M7020 Olecranon bursitis, unspecified elbow: Secondary | ICD-10-CM | POA: Diagnosis not present

## 2012-12-10 DIAGNOSIS — H0019 Chalazion unspecified eye, unspecified eyelid: Secondary | ICD-10-CM | POA: Diagnosis not present

## 2012-12-16 DIAGNOSIS — Z85828 Personal history of other malignant neoplasm of skin: Secondary | ICD-10-CM | POA: Diagnosis not present

## 2012-12-16 DIAGNOSIS — D692 Other nonthrombocytopenic purpura: Secondary | ICD-10-CM | POA: Diagnosis not present

## 2012-12-16 DIAGNOSIS — L819 Disorder of pigmentation, unspecified: Secondary | ICD-10-CM | POA: Diagnosis not present

## 2012-12-18 ENCOUNTER — Other Ambulatory Visit: Payer: Self-pay | Admitting: *Deleted

## 2012-12-18 MED ORDER — FLUTICASONE PROPIONATE 50 MCG/ACT NA SUSP: 2.0000 | Freq: Every day | NASAL | Status: DC | PRN

## 2012-12-21 DIAGNOSIS — H0019 Chalazion unspecified eye, unspecified eyelid: Secondary | ICD-10-CM | POA: Diagnosis not present

## 2012-12-25 DIAGNOSIS — Z79899 Other long term (current) drug therapy: Secondary | ICD-10-CM | POA: Diagnosis not present

## 2012-12-25 DIAGNOSIS — Z7901 Long term (current) use of anticoagulants: Secondary | ICD-10-CM | POA: Diagnosis not present

## 2012-12-25 DIAGNOSIS — I4891 Unspecified atrial fibrillation: Secondary | ICD-10-CM | POA: Diagnosis not present

## 2012-12-25 DIAGNOSIS — E7800 Pure hypercholesterolemia, unspecified: Secondary | ICD-10-CM | POA: Diagnosis not present

## 2012-12-25 DIAGNOSIS — I495 Sick sinus syndrome: Secondary | ICD-10-CM | POA: Diagnosis not present

## 2012-12-25 DIAGNOSIS — Z950 Presence of cardiac pacemaker: Secondary | ICD-10-CM | POA: Diagnosis not present

## 2013-01-05 ENCOUNTER — Encounter: Payer: Self-pay | Admitting: Internal Medicine

## 2013-01-06 ENCOUNTER — Ambulatory Visit (INDEPENDENT_AMBULATORY_CARE_PROVIDER_SITE_OTHER): Payer: Medicare Other | Admitting: Internal Medicine

## 2013-01-06 ENCOUNTER — Encounter: Payer: Self-pay | Admitting: Internal Medicine

## 2013-01-06 VITALS — BP 163/77 | HR 71 | Ht 64.0 in | Wt 132.0 lb

## 2013-01-06 DIAGNOSIS — I495 Sick sinus syndrome: Secondary | ICD-10-CM | POA: Insufficient documentation

## 2013-01-06 DIAGNOSIS — Z7901 Long term (current) use of anticoagulants: Secondary | ICD-10-CM | POA: Diagnosis not present

## 2013-01-06 DIAGNOSIS — I951 Orthostatic hypotension: Secondary | ICD-10-CM | POA: Diagnosis not present

## 2013-01-06 DIAGNOSIS — I4891 Unspecified atrial fibrillation: Secondary | ICD-10-CM | POA: Diagnosis not present

## 2013-01-06 DIAGNOSIS — I10 Essential (primary) hypertension: Secondary | ICD-10-CM | POA: Diagnosis not present

## 2013-01-06 DIAGNOSIS — Z950 Presence of cardiac pacemaker: Secondary | ICD-10-CM

## 2013-01-06 MED ORDER — PROPAFENONE HCL ER 225 MG PO CP12: 225.0000 mg | ORAL_CAPSULE | Freq: Two times a day (BID) | ORAL | Status: DC

## 2013-01-06 NOTE — Progress Notes (Signed)
Primary Care Physician: Ginette Otto, MD Referring Physician:  Dr Monte Fantasia is a 79 y.o. female with a h/o paroxysmal atrial fibrillation and atrial flutter who presents for EP consultation.  She reports having afib for more than 5 years.  She underwent PPM previously for tachy/brady syndrome.  She was treated by Dr Amil Amen with relatively high doses of flecainide.  She was evaluated by Dr Anne Fu recently and was noted to have episodes of presyncope.  No ventricular arrhythmias were documented and her symptoms were felt to be due to orthostasis.  She did have her flecainide discontinued and was placed on amiodarone.  Her presyncope has resolved.  She unfortunately has developed significant rise in afib burden.  She reports symptoms of fatigue and decreased exercise with afib.  She does not tolerate her afib very well.  She reports that episodes occur frequently (several times per week) and last hours at a time.  She is unaware of triggers or precipitants.  Today, she denies symptoms of chest pain, shortness of breath, orthopnea, PND, lower extremity edema,  or neurologic sequela. The patient is tolerating medications without difficulties and is otherwise without complaint today.   Past Medical History  Diagnosis Date  . GERD (gastroesophageal reflux disease)   . Obstructive chronic bronchitis with exacerbation     Dr. Maple Hudson  . Rhinitis   . Hypertension   . Psoriasis   . Diverticulosis   . Asthmatic bronchitis   . Pure hypercholesterolemia   . Macular degeneration     Dr. Jesusita Oka  . Aortic valve disorders   . Unspecified constipation   . Osteoarthrosis, unspecified whether generalized or localized, other specified sites   . Low back pain   . Abnormality of gait   . Encounter for long-term (current) use of other medications   . Staghorn calculus     Left  . Benign hypertensive kidney disease with chronic kidney disease stage I through stage IV, or unspecified(403.10)    . COPD (chronic obstructive pulmonary disease)     Dr. Maple Hudson  . PAF (paroxysmal atrial fibrillation)     Stopped flecainide, on amiodarone but still has bouts of A FIB (mostly in mornings).   . Tachy-brady syndrome     s/p permanent pacemaker 06/27/1999 (Battery change 06/2007)   Past Surgical History  Procedure Laterality Date  . Partital nephrectomy Left 1976    stone disease  . Appendectomy    . Bunionectomy with hammertoe reconstruction    . Total knee arthroplasty Right   . Incisional hernia repair    . Cataract extraction, bilateral    . Tonsillectomy and adenoidectomy    . Cardiac pacemaker placement  06/27/99    Medtronic PM implanted by Dr Amil Amen    Current Outpatient Prescriptions  Medication Sig Dispense Refill  . budesonide-formoterol (SYMBICORT) 160-4.5 MCG/ACT inhaler Inhale 2 puffs into the lungs 2 (two) times daily. Rinse mouth  30.6 g  3  . dabigatran (PRADAXA) 150 MG CAPS Take 150 mg by mouth 2 (two) times daily.       [name] diltiazem (CARDIZEM CD) 120 MG 24 hr capsule Take 1 capsule (120 mg total) by mouth daily.      . fluticasone (FLONASE) 50 MCG/ACT nasal spray Place 2 sprays into the nose daily as needed for rhinitis.  48 g  0  . furosemide (LASIX) 40 MG tablet Take 20 mg by mouth daily. Take 1/2 to 1 by mouth daily      .  levalbuterol (XOPENEX HFA) 45 MCG/ACT inhaler 2 puffs four times daily as needed rescue inhaler  3 Inhaler  3  . Lutein 20 MG CAPS Take 1 capsule by mouth daily.        . montelukast (SINGULAIR) 10 MG tablet TAKE 1 TABLET (10 MG TOTAL) BY MOUTH AT BEDTIME.  90 tablet  1  . Multiple Vitamin (MULTIVITAMIN) tablet Take 1 tablet by mouth daily.        . Multiple Vitamins-Minerals (PRESERVISION AREDS PO) Take 2 tablets by mouth daily.        . Naproxen Sodium (ALEVE) 220 MG CAPS Take 2 capsules by mouth daily.      . Magnesium 200 MG TABS Take 200 mg by mouth daily.      . propafenone (RYTHMOL SR) 225 MG 12 hr capsule Take 1 capsule (225 mg total)  by mouth 2 (two) times daily.  60 capsule  11   No current facility-administered medications for this visit.    Allergies  Allergen Reactions  . Penicillins Hives  . Brimonidine Tartrate-Timolol     REACTION: systemic malaise.    History   Social History  . Marital Status: Married    Spouse Name: N/A    Number of Children: 2  . Years of Education: N/A   Occupational History  .    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 Smoking status: Never Smoker   . Smokeless tobacco: Not on file  . Alcohol Use: Yes     Comment: Rarely  . Drug Use: No  . Sexual Activity: Not on file   Other Topics Concern  . Not on file   Social History Narrative  . No narrative on file    Family History  Problem Relation Age of Onset  . Malignant hypertension Father   . Hypertension Father   . Renal Disease Father   . Breast cancer Mother   . Heart attack Brother   . Stroke Brother     ROS- All systems are reviewed and negative except as per the HPI above  Physical Exam: Filed Vitals:   01/06/13 1214  BP: 163/77  Pulse: 71  Height: 5\' 4"  (1.626 m)  Weight: 132 lb (59.875 kg)    GEN- The patient is elderly appearing, alert and oriented x 3 today.   Head- normocephalic, atraumatic Eyes-  Sclera clear, conjunctiva pink Ears- hearing intact Oropharynx- clear Neck- supple  Lungs- Clear to ausculation bilaterally, normal work of breathing Heart- Regular rate and rhythm, no murmurs, rubs or gallops, PMI not laterally displaced GI- soft, NT, ND, + BS Extremities- no clubbing, cyanosis, or edema MS- no significant deformity or atrophy Skin- no rash or lesion Psych- euthymic mood, full affect Neuro- strength and sensation are intact  EKG today reveals atrial pacing 65 bpm, LAHB Recent myoview and Dr Anne Fu notes are reviewed at length  Assessment and Plan:  1. Afib/ atrial flutter The patient has symptomatic paroxysmal atrial fibrillation and atrial  flutter.  She has failed medical therapy with flecainide and amiodarone.  Therapeutic strategies for afib/ atrial flutter including medicine and ablation were discussed in detail with the patient today. Risk, benefits, and alternatives to EP study and radiofrequency ablation for afib were also discussed in detail today. She would like to avoid ablation.  As her afib burden was better with flecainide than amiodarone, I will try Rythmol at this time.  Start rhythmol SR 225mg  BID.  Return in 6 weeks for further assessment. Continue long term anticoagulation  2. Presyncope I agree with Dr Anne Fu that by history this was likely orthostasis.    3. Tachy/brady Normal pacemaker function See Pace Art report No changes today  4. HTN Stable No change required today  Return in 6 weeks

## 2013-01-06 NOTE — Patient Instructions (Addendum)
Your physician recommends that you schedule a follow-up appointment in: 6 weeks with Dr Johney Frame  Your physician has recommended you make the following change in your medication:  1) Start Rythmol SR 225mg  twice daily--in 48 hours 2) Stop Amiodarone

## 2013-01-11 ENCOUNTER — Other Ambulatory Visit: Payer: Self-pay | Admitting: *Deleted

## 2013-01-11 MED ORDER — RYTHMOL SR 225 MG PO CP12: 225.0000 mg | ORAL_CAPSULE | Freq: Two times a day (BID) | ORAL | Status: DC

## 2013-01-12 DIAGNOSIS — I10 Essential (primary) hypertension: Secondary | ICD-10-CM | POA: Diagnosis not present

## 2013-01-14 ENCOUNTER — Telehealth: Payer: Self-pay | Admitting: Internal Medicine

## 2013-01-14 NOTE — Telephone Encounter (Signed)
Pt wanted to know which flu shot she should get. I gave her the recommendations for the high dose. She states she will think about it and decide. Carron Curie, CMA

## 2013-01-20 ENCOUNTER — Institutional Professional Consult (permissible substitution): Payer: Medicare Other | Admitting: Internal Medicine

## 2013-01-26 DIAGNOSIS — M2010 Hallux valgus (acquired), unspecified foot: Secondary | ICD-10-CM | POA: Diagnosis not present

## 2013-01-26 DIAGNOSIS — M2040 Other hammer toe(s) (acquired), unspecified foot: Secondary | ICD-10-CM | POA: Diagnosis not present

## 2013-01-26 DIAGNOSIS — L851 Acquired keratosis [keratoderma] palmaris et plantaris: Secondary | ICD-10-CM | POA: Diagnosis not present

## 2013-02-09 ENCOUNTER — Ambulatory Visit: Payer: Self-pay | Admitting: Obstetrics & Gynecology

## 2013-02-09 ENCOUNTER — Ambulatory Visit: Payer: Self-pay | Admitting: Obstetrics and Gynecology

## 2013-02-15 ENCOUNTER — Telehealth: Payer: Self-pay | Admitting: Cardiology

## 2013-02-15 NOTE — Telephone Encounter (Signed)
Called patient's son back. She is taking lasix every day but still has lower extremity edema. Would like to see Dr.Skains sooner than December. Advised appointment 11/12 at 215 pm with Dr.Skains.

## 2013-02-15 NOTE — Telephone Encounter (Signed)
New Problem  Son called-states mother is in Afib..// medications were changed, feet and ankles are swollen. Pt is primarily concerned about the swelling// pt request a call back to discuss symptoms// also requests an earlier appt/

## 2013-02-17 ENCOUNTER — Encounter: Payer: Self-pay | Admitting: Cardiology

## 2013-02-17 ENCOUNTER — Ambulatory Visit (INDEPENDENT_AMBULATORY_CARE_PROVIDER_SITE_OTHER): Payer: Medicare Other | Admitting: Cardiology

## 2013-02-17 VITALS — BP 128/70 | HR 68 | Ht 64.0 in | Wt 138.4 lb

## 2013-02-17 DIAGNOSIS — I480 Paroxysmal atrial fibrillation: Secondary | ICD-10-CM

## 2013-02-17 DIAGNOSIS — R600 Localized edema: Secondary | ICD-10-CM

## 2013-02-17 DIAGNOSIS — Z79899 Other long term (current) drug therapy: Secondary | ICD-10-CM

## 2013-02-17 DIAGNOSIS — R609 Edema, unspecified: Secondary | ICD-10-CM | POA: Diagnosis not present

## 2013-02-17 DIAGNOSIS — I4891 Unspecified atrial fibrillation: Secondary | ICD-10-CM

## 2013-02-17 MED ORDER — PROPAFENONE HCL ER 325 MG PO CP12: 325.0000 mg | ORAL_CAPSULE | Freq: Two times a day (BID) | ORAL | Status: DC

## 2013-02-17 MED ORDER — FUROSEMIDE 40 MG PO TABS: 40.0000 mg | ORAL_TABLET | Freq: Every day | ORAL | Status: DC

## 2013-02-17 NOTE — Progress Notes (Signed)
[address], [address] [place], [place]  [address] Phone: [phone] Fax:  [phone]  Date:  02/17/2013   ID:  Jody Blake, DOB 10/01/1925, MRN [id number]  PCP:  Ginette Otto, MD   History of Present Illness: Jody Blake is a 79 y.o. female with paroxysmal atrial fibrillation. Frequent episodes. Most recently saw Dr. Johney Frame who discontinued amiodarone and resumed 1C agent Rythmol. For approximately 2 weeks after starting she felt great. Her friend were commenting on how her color looked better. Shortly thereafter, she began having more episodes of atrial fibrillation. Earlier this morning for instance she felt like she was in A. fib for about an hour and a half. She also has started to develop some lower extremity/pedal edema which is quite significant. The only change is Rythmol. She has been on diltiazem for quite some time. No change in dose. She tried to take increasing Lasix over the weekend as well. 40 mg.   Wt Readings from Last 3 Encounters:  02/17/13 138 lb 6.4 oz (62.778 kg)  01/06/13 132 lb (59.875 kg)  09/07/12 134 lb 7.7 oz (61 kg)     Past Medical History  Diagnosis Date  . GERD (gastroesophageal reflux disease)   . Obstructive chronic bronchitis with exacerbation     Dr. Maple Hudson  . Rhinitis   . Hypertension   . Psoriasis   . Diverticulosis   . Asthmatic bronchitis   . Pure hypercholesterolemia   . Macular degeneration     Dr. Jesusita Oka  . Aortic valve disorders   . Unspecified constipation   . Osteoarthrosis, unspecified whether generalized or localized, other specified sites   . Low back pain   . Abnormality of gait   . Encounter for long-term (current) use of other medications   . Staghorn calculus     Left  . Benign hypertensive kidney disease with chronic kidney disease stage I through stage IV, or unspecified(403.10)   . COPD (chronic obstructive pulmonary disease)     Dr. Maple Hudson  . PAF (paroxysmal atrial fibrillation)    Stopped flecainide, on amiodarone but still has bouts of A FIB (mostly in mornings).   . Tachy-brady syndrome     s/p permanent pacemaker 06/27/1999 (Battery change 06/2007)    Past Surgical History  Procedure Laterality Date  . Partital nephrectomy Left 1976    stone disease  . Appendectomy    . Bunionectomy with hammertoe reconstruction    . Total knee arthroplasty Right   . Incisional hernia repair    . Cataract extraction, bilateral    . Tonsillectomy and adenoidectomy    . Cardiac pacemaker placement  06/27/99    Medtronic PM implanted by Dr Amil Amen    Current Outpatient Prescriptions  Medication Sig Dispense Refill  . budesonide-formoterol (SYMBICORT) 160-4.5 MCG/ACT inhaler Inhale 2 puffs into the lungs 2 (two) times daily. Rinse mouth  30.6 g  3  . dabigatran (PRADAXA) 150 MG CAPS Take 150 mg by mouth 2 (two) times daily.       [name] diltiazem (CARDIZEM CD) 120 MG 24 hr capsule Take 240 mg by mouth daily.      . fluticasone (FLONASE) 50 MCG/ACT nasal spray Place 2 sprays into the nose daily as needed for rhinitis.  48 g  0  . furosemide (LASIX) 40 MG tablet Take 20 mg by mouth daily. Take 1/2 to 1 by mouth daily      . levalbuterol (XOPENEX HFA) 45 MCG/ACT inhaler  2 puffs four times daily as needed rescue inhaler  3 Inhaler  3  . Lutein 20 MG CAPS Take 1 capsule by mouth daily.        . Magnesium 200 MG TABS Take 200 mg by mouth daily.      . montelukast (SINGULAIR) 10 MG tablet TAKE 1 TABLET (10 MG TOTAL) BY MOUTH AT BEDTIME.  90 tablet  1  . Multiple Vitamin (MULTIVITAMIN) tablet Take 1 tablet by mouth daily.        . Multiple Vitamins-Minerals (PRESERVISION AREDS PO) Take 2 tablets by mouth daily.        . Naproxen Sodium (ALEVE) 220 MG CAPS Take 2 capsules by mouth daily.      [name] RYTHMOL SR 225 MG 12 hr capsule Take 1 capsule (225 mg total) by mouth 2 (two) times daily.  60 capsule  11   No current facility-administered medications for this visit.    Allergies:      Allergies  Allergen Reactions  . Penicillins Hives  . Brimonidine Tartrate-Timolol     REACTION: systemic malaise.    Social History:  The patient  reports that she has never smoked. She does not have any smokeless tobacco history on file. She reports that she drinks alcohol. She reports that she does not use illicit drugs.   ROS:  Please see the history of present illness.   Increased edema, no syncope, no bleeding. No orthopnea.  PHYSICAL EXAM: VS:  BP 128/70  Pulse 68  Ht 5\' 4"  (1.626 m)  Wt 138 lb 6.4 oz (62.778 kg)  BMI 23.74 kg/m2  SpO2 98% Well nourished, well developed, in no acute distress HEENT: normal Neck: no JVD Cardiac:  normal S1, S2; RRR; no murmur Lungs:  clear to auscultation bilaterally, no wheezing, rhonchi or rales Abd: soft, nontender, no hepatomegaly Ext: no edema Skin: warm and dry Neuro: no focal abnormalities noted  EKG:  Atrial pacing, 63, QRS duration 102 ms.     ASSESSMENT AND PLAN:  1. Paroxysmal atrial fibrillation-Dr. Allred started Rythmol 225 twice a day. I will increase to 325 mg by mouth every 12 hours. Hopefully improve symptoms. After discussion with Dr. Johney Frame, she is trying to avoid ablative therapy. She is seeing Dr. Johney Frame again soon. 2. Pedal edema-only recent changes Rythmol. This can be a side effect of Rythmol. This is also possible side effect of diltiazem although this dosage has not changed recently. I agree with increasing her Lasix to 40 mg once a day. Compression hose.  Signed, Donato Schultz, MD [facility]  02/17/2013 2:33 PM

## 2013-02-17 NOTE — Patient Instructions (Signed)
Your physician has recommended you make the following change in your medication:   1. Increase Rythmol 325 mg twice daily 2. Lasix 40 mg  Once daily  *Please wear Compression stockings*  Your physician recommends that you schedule a follow-up appointment in: 3 months with Dr. Anne Fu.

## 2013-02-24 ENCOUNTER — Encounter: Payer: Self-pay | Admitting: Internal Medicine

## 2013-02-24 ENCOUNTER — Ambulatory Visit (INDEPENDENT_AMBULATORY_CARE_PROVIDER_SITE_OTHER): Payer: Medicare Other | Admitting: Internal Medicine

## 2013-02-24 VITALS — BP 160/80 | HR 64 | Ht 64.0 in | Wt 138.0 lb

## 2013-02-24 DIAGNOSIS — I10 Essential (primary) hypertension: Secondary | ICD-10-CM

## 2013-02-24 DIAGNOSIS — Z7901 Long term (current) use of anticoagulants: Secondary | ICD-10-CM

## 2013-02-24 DIAGNOSIS — I4891 Unspecified atrial fibrillation: Secondary | ICD-10-CM

## 2013-02-24 DIAGNOSIS — I495 Sick sinus syndrome: Secondary | ICD-10-CM

## 2013-02-24 DIAGNOSIS — I951 Orthostatic hypotension: Secondary | ICD-10-CM | POA: Diagnosis not present

## 2013-02-24 DIAGNOSIS — Z950 Presence of cardiac pacemaker: Secondary | ICD-10-CM

## 2013-02-24 LAB — MDC_IDC_ENUM_SESS_TYPE_INCLINIC
Battery Voltage: 2.96 V
Brady Statistic AP VS Percent: 87.9 %
Lead Channel Impedance Value: 456 Ohm
Lead Channel Impedance Value: 464 Ohm
Lead Channel Sensing Intrinsic Amplitude: 3.4 mV

## 2013-02-24 MED ORDER — DILTIAZEM HCL ER COATED BEADS 240 MG PO CP24: 240.0000 mg | ORAL_CAPSULE | Freq: Two times a day (BID) | ORAL | Status: DC

## 2013-02-24 NOTE — Patient Instructions (Signed)
Increase Diltiazem to 240mg  1 tablet twice daily  Follow up with Dr Johney Frame in 2 months.

## 2013-02-24 NOTE — Progress Notes (Signed)
PCP: Ginette Otto, MD Primary Cardiologist: Marylouise Stacks Jody Blake is a 79 y.o. female who presents today for routine electrophysiology followup.  Her amiodarone was changed to Rhythmol in October of this year.  She states that she was improved for a short while following that with decrease in her burden of atrial fibrillation.  Last week, she developed increased afib burden.  She was symptomatic with chest tightness and palpitations.  She was seen by Dr Anne Fu who increased her Rhythmol.  She has done better since that time.  Device interrogation today reveals 11.6% of the the she is in atrial fib/flutter.  Atrial therapies are programmed on with success rates 63% of the time.  Her ventricular rates are not well controlled.  She has prn Diltiazem to take for tachycardia. All of her episodes are less than 12 hours in duration.  Today, she denies symptoms of palpitations, chest pain, shortness of breath,  lower extremity edema, dizziness, presyncope, or syncope.  The patient is otherwise without complaint today.   Past Medical History  Diagnosis Date  . GERD (gastroesophageal reflux disease)   . Obstructive chronic bronchitis with exacerbation     Dr. Maple Hudson  . Rhinitis   . Hypertension   . Psoriasis   . Diverticulosis   . Asthmatic bronchitis   . Pure hypercholesterolemia   . Macular degeneration     Dr. Jesusita Oka  . Aortic valve disorders   . Unspecified constipation   . Osteoarthrosis, unspecified whether generalized or localized, other specified sites   . Low back pain   . Abnormality of gait   . Encounter for long-term (current) use of other medications   . Staghorn calculus     Left  . Benign hypertensive kidney disease with chronic kidney disease stage I through stage IV, or unspecified(403.10)   . COPD (chronic obstructive pulmonary disease)     Dr. Maple Hudson  . PAF (paroxysmal atrial fibrillation)     Stopped flecainide, on amiodarone but still has bouts of A FIB (mostly in  mornings).   . Tachy-brady syndrome     s/p permanent pacemaker 06/27/1999 (Battery change 06/2007)   Past Surgical History  Procedure Laterality Date  . Partital nephrectomy Left 1976    stone disease  . Appendectomy    . Bunionectomy with hammertoe reconstruction    . Total knee arthroplasty Right   . Incisional hernia repair    . Cataract extraction, bilateral    . Tonsillectomy and adenoidectomy    . Cardiac pacemaker placement  06/27/99    Medtronic PM implanted by Dr Amil Amen    Current Outpatient Prescriptions  Medication Sig Dispense Refill  . budesonide-formoterol (SYMBICORT) 160-4.5 MCG/ACT inhaler Inhale 2 puffs into the lungs 2 (two) times daily. Rinse mouth  30.6 g  3  . cholecalciferol (VITAMIN D) 1000 UNITS tablet Take 2,000 Units by mouth daily.      . dabigatran (PRADAXA) 150 MG CAPS Take 150 mg by mouth 2 (two) times daily.       [name] diltiazem (CARDIZEM CD) 240 MG 24 hr capsule Take 1 capsule (240 mg total) by mouth 2 (two) times daily.  60 capsule  11  . diltiazem (CARDIZEM) 30 MG tablet Take 30 mg by mouth as needed.      . fluticasone (FLONASE) 50 MCG/ACT nasal spray Place 2 sprays into the nose daily as needed for rhinitis.  48 g  0  . furosemide (LASIX) 40 MG tablet Take 1 tablet (40 mg total) by  mouth daily.  30 tablet  3  . levalbuterol (XOPENEX HFA) 45 MCG/ACT inhaler 2 puffs four times daily as needed rescue inhaler  3 Inhaler  3  . Lutein 20 MG CAPS Take 1 capsule by mouth daily.        . Magnesium 200 MG TABS Take 200 mg by mouth daily.      . montelukast (SINGULAIR) 10 MG tablet TAKE 1 TABLET (10 MG TOTAL) BY MOUTH AT BEDTIME.  90 tablet  1  . Multiple Vitamin (MULTIVITAMIN) tablet Take 1 tablet by mouth daily.        . Multiple Vitamins-Minerals (PRESERVISION AREDS PO) Take 2 tablets by mouth daily.        . Naproxen Sodium (ALEVE) 220 MG CAPS Take 2 capsules by mouth daily.      . propafenone (RYTHMOL SR) 325 MG 12 hr capsule Take 1 capsule (325 mg total)  by mouth 2 (two) times daily.  60 capsule  4   No current facility-administered medications for this visit.    Physical Exam: Filed Vitals:   02/24/13 1209  BP: 160/80  Pulse: 64  Height: 5\' 4"  (1.626 m)  Weight: 138 lb (62.596 kg)    GEN- The patient is well appearing, alert and oriented x 3 today.   Head- normocephalic, atraumatic Eyes-  Sclera clear, conjunctiva pink Ears- hearing intact Oropharynx- clear Lungs- Clear to ausculation bilaterally, normal work of breathing Chest- pacemaker pocket is well healed Heart- Regular rate and rhythm, no murmurs, rubs or gallops, PMI not laterally displaced GI- soft, NT, ND, + BS Extremities- no clubbing, cyanosis, or edema  Pacemaker interrogation- reviewed in detail today,  See PACEART report  Assessment and Plan:  1. Tachy/brady Normal pacemaker function See Pace Art report No changes today  2.  Atrial fibrillation Continues to be an issue but improved with increased rhythmol Increase diltiazem to 240mg  BID for better rate control Given her advanced age, I am reluctant to consider ablation  3.  HTN Above goal Increase diltiazem as above

## 2013-02-25 ENCOUNTER — Telehealth: Payer: Self-pay

## 2013-02-25 NOTE — Telephone Encounter (Signed)
called the pharm to clarify how the patient is to take his cardiem cr 240 mg bid

## 2013-03-03 ENCOUNTER — Ambulatory Visit (INDEPENDENT_AMBULATORY_CARE_PROVIDER_SITE_OTHER): Payer: Medicare Other | Admitting: Adult Health

## 2013-03-03 ENCOUNTER — Encounter: Payer: Self-pay | Admitting: Adult Health

## 2013-03-03 ENCOUNTER — Ambulatory Visit (INDEPENDENT_AMBULATORY_CARE_PROVIDER_SITE_OTHER)
Admission: RE | Admit: 2013-03-03 | Discharge: 2013-03-03 | Disposition: A | Discharge status: Home / Self Care | Payer: Medicare Other | Source: Ambulatory Visit | Attending: Adult Health | Admitting: Adult Health

## 2013-03-03 VITALS — BP 134/68 | HR 69 | Temp 98.0°F | Ht 64.25 in | Wt 144.0 lb

## 2013-03-03 DIAGNOSIS — J441 Chronic obstructive pulmonary disease with (acute) exacerbation: Secondary | ICD-10-CM | POA: Diagnosis not present

## 2013-03-03 DIAGNOSIS — J309 Allergic rhinitis, unspecified: Secondary | ICD-10-CM

## 2013-03-03 DIAGNOSIS — J449 Chronic obstructive pulmonary disease, unspecified: Secondary | ICD-10-CM | POA: Diagnosis not present

## 2013-03-03 MED ORDER — CEFDINIR 300 MG PO CAPS: 300.0000 mg | ORAL_CAPSULE | Freq: Two times a day (BID) | ORAL | Status: DC

## 2013-03-03 MED ORDER — METHYLPREDNISOLONE ACETATE 80 MG/ML IJ SUSP
80.0000 mg | Freq: Once | INTRAMUSCULAR | Status: AC
  Administered 2013-03-03: 80 mg via INTRAMUSCULAR

## 2013-03-03 NOTE — Progress Notes (Signed)
Subjective:    Patient ID: Jody Blake, female    DOB: 04-Jan-1926, 79 y.o.   MRN: [id number]  HPI 10/11/10- 14 yoF never smoker, followed for allergic rhinitis, chronic bronchitis, complicated by GERD, Hx PAfib/ pacemaker. Last here April 26, 2010. Note reviewed. She says the summer heat hasn't been so bad yet. She has to stop part way up hills or stairs to get her breath. Morning cough is normally. productive of clear phlegm. She walks daily and goes to fitness class twice weekly.  Should check on A1AT status next visit  06/27/11- 84 yoF never smoker, followed for allergic rhinitis, chronic bronchitis, complicated by GERD, Hx PAfib/ pacemaker. Acute visit-cough-productive-mostly clear and gets worse in the afternoon; streaks of blood (tiny amount) from time to time. Having SOB and wheezing as well 2 weeks ago exposed to cold wind outside. Sinus congestion cough malaise and weakness. Had congested with yellow nasal discharge but no headache. Chest feels wheezy with cough. Took one week of doxycycline and Mucinex DM. Denies fever, chills, pain.  06/26/12- 84 yoF never smoker, followed for allergic rhinitis, chronic bronchitis, complicated by GERD, Hx PAfib/ pacemaker. FOLLOWS FOR: SOB and wheezing at times-activity makes it worse; slight cough-productive-clear in color, congestion as well. One episode of winter bronchitis treated and resolved. Feels well now. Grew up in smoking household.  03/03/2013 Acute OV Complains of chest tightness, labored breathing, DOE, some prod cough with pale yellow mucus x3-4weeks.  denies f/c/s, hemoptysis, nausea, vomiting. Was on amiodarone few months ago briefly for Atrial Fib.  Now on Rythmol.  She denies any hemoptysis, orthopnea, PND. Does have chronic lower extremity swelling and is on Lasix. She denies any recent travel or antibiotic use. She has not taken any medications for her cough or congestion.    Review of Systems Constitutional:   No   weight loss, night sweats,  Fevers, chills, fatigue, or  lassitude.  HEENT:   No headaches,  Difficulty swallowing,  Tooth/dental problems, or  Sore throat,                No sneezing, itching, ear ache, nasal congestion, post nasal drip,   CV:  No chest pain,  Orthopnea, PND,   anasarca, dizziness, palpitations, syncope. +leg swelling   GI  No heartburn, indigestion, abdominal pain, nausea, vomiting, diarrhea, change in bowel habits, loss of appetite, bloody stools.   Resp:    No chest wall deformity  Skin: no rash or lesions.  GU: no dysuria, change in color of urine, no urgency or frequency.  No flank pain, no hematuria   MS:  No joint pain or swelling.  No decreased range of motion.  No back pain.  Psych:  No change in mood or affect. No depression or anxiety.  No memory loss.          Objective:   Physical Exam GEN: A/Ox3; pleasant , NAD, elderly   HEENT:  [place]/AT,  EACs-clear, TMs-wnl, NOSE-clear, THROAT-clear, no lesions, no postnasal drip or exudate noted.   NECK:  Supple w/ fair ROM; no JVD; normal carotid impulses w/o bruits; no thyromegaly or nodules palpated; no lymphadenopathy.  RESP  Clear  P & A; w/o, wheezes/ rales/ or rhonchi.no accessory muscle use, no dullness to percussion  CARD: Irreg  no m/r/g  ,tr -1+peripheral edema, pulses intact, no cyanosis or clubbing.  GI:   Soft & nt; nml bowel sounds; no organomegaly or masses detected.  Musco: Warm bil, no deformities or joint swelling  noted.   Neuro: alert, no focal deficits noted.    Skin: Warm, no lesions or rashes         Assessment & Plan:

## 2013-03-03 NOTE — Assessment & Plan Note (Signed)
Flare w/ Bronchitis  Check xray today    Plan  Omnicef 300mg  Twice daily  For 7days  Zyrtec 10mg  daily at bedtime .  Mucinex DM Twice daily  As needed  Cough/congestion  Chest xray today .  Please contact office for sooner follow up if symptoms do not improve or worsen or seek emergency care  follow up Dr. Maple Hudson  As planned and As needed

## 2013-03-03 NOTE — Patient Instructions (Addendum)
Omnicef 300mg  Twice daily  For 7days  Zyrtec 10mg  daily at bedtime .  Mucinex DM Twice daily  As needed  Cough/congestion  Chest xray today .  Please contact office for sooner follow up if symptoms do not improve or worsen or seek emergency care  follow up Dr. Maple Hudson  As planned and As needed

## 2013-03-23 ENCOUNTER — Ambulatory Visit (INDEPENDENT_AMBULATORY_CARE_PROVIDER_SITE_OTHER): Payer: Medicare Other | Admitting: Cardiology

## 2013-03-23 ENCOUNTER — Encounter: Payer: Self-pay | Admitting: Cardiology

## 2013-03-23 VITALS — BP 150/70 | HR 60 | Ht 64.0 in | Wt 138.0 lb

## 2013-03-23 DIAGNOSIS — I10 Essential (primary) hypertension: Secondary | ICD-10-CM

## 2013-03-23 DIAGNOSIS — J449 Chronic obstructive pulmonary disease, unspecified: Secondary | ICD-10-CM

## 2013-03-23 DIAGNOSIS — R609 Edema, unspecified: Secondary | ICD-10-CM | POA: Diagnosis not present

## 2013-03-23 DIAGNOSIS — R600 Localized edema: Secondary | ICD-10-CM

## 2013-03-23 DIAGNOSIS — I5032 Chronic diastolic (congestive) heart failure: Secondary | ICD-10-CM | POA: Diagnosis not present

## 2013-03-23 DIAGNOSIS — I4891 Unspecified atrial fibrillation: Secondary | ICD-10-CM

## 2013-03-23 DIAGNOSIS — I480 Paroxysmal atrial fibrillation: Secondary | ICD-10-CM

## 2013-03-23 MED ORDER — FUROSEMIDE 40 MG PO TABS: 40.0000 mg | ORAL_TABLET | Freq: Two times a day (BID) | ORAL | Status: DC

## 2013-03-23 NOTE — Progress Notes (Signed)
[address], [address] [place], [place]  [address] Phone: [phone] Fax:  [phone]  Date:  03/23/2013   ID:  Jody Blake, DOB Jun 11, 1925, MRN [id number]  PCP:  Ginette Otto, MD   History of Present Illness: Jody Blake is a 79 y.o. female with paroxysmal atrial fibrillation. Frequent episodes. Most recently saw Dr. Johney Frame who discontinued amiodarone and resumed 1C agent Rythmol. For approximately 2 weeks after starting she felt great. Her friend were commenting on how her color looked better. Shortly thereafter, she began having more episodes of atrial fibrillation. Earlier this morning for instance she felt like she was in A. fib for about an hour and a half. She also has started to develop some lower extremity/pedal edema which was quite significant. I increased Rythmol. She has been on diltiazem for quite some time. No change in dose.  Wt Readings from Last 3 Encounters:  03/23/13 138 lb (62.596 kg)  03/03/13 144 lb (65.318 kg)  02/24/13 138 lb (62.596 kg)     Past Medical History  Diagnosis Date  . GERD (gastroesophageal reflux disease)   . Obstructive chronic bronchitis with exacerbation     Dr. Maple Hudson  . Rhinitis   . Hypertension   . Psoriasis   . Diverticulosis   . Asthmatic bronchitis   . Pure hypercholesterolemia   . Macular degeneration     Dr. Jesusita Oka  . Aortic valve disorders   . Unspecified constipation   . Osteoarthrosis, unspecified whether generalized or localized, other specified sites   . Low back pain   . Abnormality of gait   . Encounter for long-term (current) use of other medications   . Staghorn calculus     Left  . Benign hypertensive kidney disease with chronic kidney disease stage I through stage IV, or unspecified(403.10)   . COPD (chronic obstructive pulmonary disease)     Dr. Maple Hudson  . PAF (paroxysmal atrial fibrillation)     Stopped flecainide, on amiodarone but still has bouts of A FIB (mostly in mornings).   .  Tachy-brady syndrome     s/p permanent pacemaker 06/27/1999 (Battery change 06/2007)    Past Surgical History  Procedure Laterality Date  . Partital nephrectomy Left 1976    stone disease  . Appendectomy    . Bunionectomy with hammertoe reconstruction    . Total knee arthroplasty Right   . Incisional hernia repair    . Cataract extraction, bilateral    . Tonsillectomy and adenoidectomy    . Cardiac pacemaker placement  06/27/99    Medtronic PM implanted by Dr Amil Amen    Current Outpatient Prescriptions  Medication Sig Dispense Refill  . budesonide-formoterol (SYMBICORT) 160-4.5 MCG/ACT inhaler Inhale 2 puffs into the lungs 2 (two) times daily. Rinse mouth  30.6 g  3  . cholecalciferol (VITAMIN D) 1000 UNITS tablet Take 2,000 Units by mouth daily.      . dabigatran (PRADAXA) 150 MG CAPS Take 150 mg by mouth 2 (two) times daily.       [name] diltiazem (CARDIZEM CD) 240 MG 24 hr capsule Take 1 capsule (240 mg total) by mouth 2 (two) times daily.  60 capsule  11  . diltiazem (CARDIZEM) 30 MG tablet Take 30 mg by mouth as needed.      . fluticasone (FLONASE) 50 MCG/ACT nasal spray Place 2 sprays into the nose daily as needed for rhinitis.  48 g  0  . furosemide (LASIX) 40 MG tablet  Take 1 tablet (40 mg total) by mouth daily.  30 tablet  3  . levalbuterol (XOPENEX HFA) 45 MCG/ACT inhaler 2 puffs four times daily as needed rescue inhaler  3 Inhaler  3  . Lutein 20 MG CAPS Take 1 capsule by mouth daily.        . Magnesium 200 MG TABS Take 200 mg by mouth daily.      . montelukast (SINGULAIR) 10 MG tablet TAKE 1 TABLET (10 MG TOTAL) BY MOUTH AT BEDTIME.  90 tablet  1  . Multiple Vitamin (MULTIVITAMIN) tablet Take 1 tablet by mouth daily.        . Multiple Vitamins-Minerals (PRESERVISION AREDS PO) Take 2 tablets by mouth daily.        . Naproxen Sodium (ALEVE) 220 MG CAPS Take 2 capsules by mouth daily.      . propafenone (RYTHMOL SR) 325 MG 12 hr capsule Take 1 capsule (325 mg total) by mouth 2  (two) times daily.  60 capsule  4   No current facility-administered medications for this visit.    Allergies:    Allergies  Allergen Reactions  . Penicillins Hives  . Brimonidine Tartrate-Timolol     REACTION: systemic malaise.    Social History:  The patient  reports that she has never smoked. She does not have any smokeless tobacco history on file. She reports that she drinks alcohol. She reports that she does not use illicit drugs.   ROS:  Please see the history of present illness.   Increased edema, no syncope, no bleeding. No orthopnea.  PHYSICAL EXAM: VS:  BP 150/70  Pulse 60  Ht 5\' 4"  (1.626 m)  Wt 138 lb (62.596 kg)  BMI 23.68 kg/m2 Well nourished, well developed, in no acute distress HEENT: normal Neck: no JVD Cardiac:  normal S1, S2; RRR; 2/6 systolic murmur right upper sternal border Lungs:  clear to auscultation bilaterally, no wheezing, rhonchi or rales Abd: soft, nontender, no hepatomegaly Ext: Mostly pedal edemaCompression hose in place Skin: warm and dry Neuro: no focal abnormalities noted  EKG:  Atrial pacing, 63, QRS duration 102 ms.     ASSESSMENT AND PLAN:  1. Paroxysmal atrial fibrillation- Rythmol 325 twice a day.  This was increased because of paroxysmal burden. Last pacemaker evaluation demonstrated 11% atrial fibrillation. Improved symptoms. 2. Pedal edema-likely exacerbated by Rythmol. This can be a side effect of Rythmol. This is also possible side effect of diltiazem although this dosage has not changed recently. I agree with increasing her Lasix to 40 mg twice a day. Compression hose. 3. Hypertension-blood pressure mildly elevated. Has been higher in the past. 4. Shortness of breath-she has seen Dr. Roxy Cedar team. They do not believe that her COPD has changed. It is likely that she has a degree of diastolic heart failure. Echocardiogram 6/14 reviewed, normal ejection fraction. Mild aortic stenosis. Increasing Lasix to 40 mg twice a day. She  personally has noted that she does not have much response anymore to the 40 mg of Lasix. Check basic metabolic profile in one week 5. Chronic anticoagulation-continue with Pradaxa. We will check a CBC in one week. 6. Followup appointment in February.  Signed, Donato Schultz, MD [facility]  03/23/2013 10:40 AM

## 2013-03-23 NOTE — Patient Instructions (Addendum)
Your physician has recommended you make the following change in your medication:   1. Increase Lasix to 40 mg twice daily.  Your physician recommends that you return for lab work in: 1 weeks , BMET, CBC  Keep scheduled appointment with Dr. Anne Fu

## 2013-03-24 ENCOUNTER — Telehealth: Payer: Self-pay | Admitting: Cardiology

## 2013-03-24 NOTE — Telephone Encounter (Signed)
New Problem:  Pt is asking if she should be on K+ with all the lasix she is taking. Pt is requesting a call back

## 2013-03-26 ENCOUNTER — Other Ambulatory Visit: Payer: Self-pay | Admitting: Internal Medicine

## 2013-03-26 NOTE — Telephone Encounter (Signed)
Spoke with patient advised to fold until after blood work,so we can determine if Potassium is needed.

## 2013-03-29 ENCOUNTER — Other Ambulatory Visit (INDEPENDENT_AMBULATORY_CARE_PROVIDER_SITE_OTHER): Payer: Medicare Other

## 2013-03-29 ENCOUNTER — Encounter: Payer: Self-pay | Admitting: Internal Medicine

## 2013-03-29 DIAGNOSIS — I4891 Unspecified atrial fibrillation: Secondary | ICD-10-CM | POA: Diagnosis not present

## 2013-03-29 DIAGNOSIS — I10 Essential (primary) hypertension: Secondary | ICD-10-CM

## 2013-03-29 DIAGNOSIS — I480 Paroxysmal atrial fibrillation: Secondary | ICD-10-CM

## 2013-03-29 DIAGNOSIS — I5032 Chronic diastolic (congestive) heart failure: Secondary | ICD-10-CM | POA: Diagnosis not present

## 2013-03-29 LAB — BASIC METABOLIC PANEL
BUN: 33 mg/dL — ABNORMAL HIGH (ref 6–23)
CO2: 31 mEq/L (ref 19–32)
GFR: 40.29 mL/min — ABNORMAL LOW (ref >60.00)
Glucose, Bld: 86 mg/dL (ref 70–99)
Potassium: 3.2 mEq/L — ABNORMAL LOW (ref 3.5–5.1)
Sodium: 137 mEq/L (ref 135–145)

## 2013-03-29 LAB — CBC WITH DIFFERENTIAL/PLATELET
Basophils Absolute: 0 10*3/uL (ref 0.0–0.1)
Eosinophils Absolute: 0.1 10*3/uL (ref 0.0–0.7)
HCT: 31.9 % — ABNORMAL LOW (ref 36.0–46.0)
Hemoglobin: 10.4 g/dL — ABNORMAL LOW (ref 12.0–15.0)
Lymphs Abs: 1.3 10*3/uL (ref 0.7–4.0)
MCHC: 32.5 g/dL (ref 30.0–36.0)
MCV: 91.9 fl (ref 78.0–100.0)
Monocytes Absolute: 0.8 10*3/uL (ref 0.1–1.0)
Neutro Abs: 6.1 10*3/uL (ref 1.4–7.7)
Platelets: 354 10*3/uL (ref 150.0–400.0)
RDW: 13.7 % (ref 11.5–14.6)

## 2013-03-30 ENCOUNTER — Other Ambulatory Visit: Payer: Medicare Other

## 2013-04-12 ENCOUNTER — Encounter: Payer: Self-pay | Admitting: Nurse Practitioner

## 2013-04-12 ENCOUNTER — Telehealth: Payer: Self-pay | Admitting: *Deleted

## 2013-04-12 ENCOUNTER — Other Ambulatory Visit: Payer: Self-pay | Admitting: *Deleted

## 2013-04-12 ENCOUNTER — Ambulatory Visit (INDEPENDENT_AMBULATORY_CARE_PROVIDER_SITE_OTHER): Payer: Medicare Other | Admitting: Nurse Practitioner

## 2013-04-12 VITALS — BP 130/90 | HR 114 | Ht 64.0 in | Wt 135.1 lb

## 2013-04-12 DIAGNOSIS — I4891 Unspecified atrial fibrillation: Secondary | ICD-10-CM | POA: Diagnosis not present

## 2013-04-12 LAB — BASIC METABOLIC PANEL
BUN: 30 mg/dL — ABNORMAL HIGH (ref 6–23)
CO2: 31 mEq/L (ref 19–32)
Calcium: 9.5 mg/dL (ref 8.4–10.5)
Chloride: 103 mEq/L (ref 96–112)
Creatinine, Ser: 1.2 mg/dL (ref 0.4–1.2)
GFR: 43.68 mL/min — ABNORMAL LOW (ref >60.00)
Glucose, Bld: 104 mg/dL — ABNORMAL HIGH (ref 70–99)
Potassium: 3.7 mEq/L (ref 3.5–5.1)
Sodium: 141 mEq/L (ref 135–145)

## 2013-04-12 LAB — CBC WITH DIFFERENTIAL/PLATELET
Basophils Absolute: 0.1 10*3/uL (ref 0.0–0.1)
Basophils Relative: 0.8 % (ref 0.0–3.0)
Eosinophils Absolute: 0 10*3/uL (ref 0.0–0.7)
Eosinophils Relative: 0 % (ref 0.0–5.0)
HCT: 30.4 % — ABNORMAL LOW (ref 36.0–46.0)
Hemoglobin: 9.9 g/dL — ABNORMAL LOW (ref 12.0–15.0)
Lymphocytes Relative: 18.8 % (ref 12.0–46.0)
Lymphs Abs: 1.3 10*3/uL (ref 0.7–4.0)
MCHC: 32.7 g/dL (ref 30.0–36.0)
MCV: 90.8 fl (ref 78.0–100.0)
Monocytes Absolute: 0.6 10*3/uL (ref 0.1–1.0)
Monocytes Relative: 7.9 % (ref 3.0–12.0)
Neutro Abs: 5.1 10*3/uL (ref 1.4–7.7)
Neutrophils Relative %: 72.5 % (ref 43.0–77.0)
Platelets: 435 10*3/uL — ABNORMAL HIGH (ref 150.0–400.0)
RBC: 3.34 Mil/uL — ABNORMAL LOW (ref 3.87–5.11)
RDW: 13.9 % (ref 11.5–14.6)
WBC: 7.1 10*3/uL (ref 4.5–10.5)

## 2013-04-12 LAB — TSH: TSH: 44.98 u[IU]/mL — ABNORMAL HIGH (ref 0.35–5.50)

## 2013-04-12 MED ORDER — METOPROLOL SUCCINATE ER 25 MG PO TB24: 25.0000 mg | ORAL_TABLET | Freq: Every day | ORAL | Status: DC

## 2013-04-12 MED ORDER — LEVOTHYROXINE SODIUM 25 MCG PO TABS: 25.0000 ug | ORAL_TABLET | Freq: Every day | ORAL | Status: DC

## 2013-04-12 MED ORDER — PROPAFENONE HCL ER 425 MG PO CP12: 425.0000 mg | ORAL_CAPSULE | Freq: Two times a day (BID) | ORAL | Status: DC

## 2013-04-12 NOTE — Patient Instructions (Addendum)
We need to check labs today  Increase the Rythmol to 425 mg two times a day - start tomorrow   Add Toprol 25 mg a day - start this today - this will help slow your heart rate down  We need to get you back to see Dr. [name] - this Thursday January 8th at 11am  Call the [place] office at [phone] if you have any questions, problems or concerns.

## 2013-04-12 NOTE — Progress Notes (Addendum)
Jody Blake Date of Birth: [birth year] Medical Record #[id number]  History of Present Illness: Jody Blake is seen back today for a work in visit. Seen for Jody. Marlou Blake. She is 80 years of age. She has PAF, has PPM in place since 2001, on chronic anticoagulation, prior therapy with amiodarone, GERD, HTN, HLD, and COPD.   Seen about 3 weeks ago - was having more atrial fib - Rythmol already increased to 325 mg BID. She was seen by Jody. Rayann Blake back in October and noted to have "failed medical therapy with flecainide and amiodarone" and that therapeutic strategies for afib/ atrial flutter including medicine and ablation were discussed and she wanted toavoid ablation.  Called earlier this morning - heart rate 101 - feeling weak and lightheaded. Advised by Jody. Meda Blake (DOD) to take an extra Rythmol and thus added to my schedule as well.   Comes in today. Here alone. Has not felt well basically since she was last here - continues to have recurrent spells of atrial fib - "too many to keep up with". Has now been out since 5pm yesterday - seems that this is her longest spells. Using extra Diltiazem with no real response. Feels weak and tired. Little short of breath. Legs feel weak. Says that Tikosyn has been discussed with her - still not really wanting to have an ablation - feels like she is "too old".   Current Outpatient Prescriptions  Medication Sig Dispense Refill  . budesonide-formoterol (SYMBICORT) 160-4.5 MCG/ACT inhaler Inhale 2 puffs into the lungs 2 (two) times daily. Rinse mouth  30.6 g  3  . cholecalciferol (VITAMIN D) 1000 UNITS tablet Take 2,000 Units by mouth daily.      . dabigatran (PRADAXA) 150 MG CAPS Take 150 mg by mouth 2 (two) times daily.       [name] diltiazem (CARDIZEM CD) 240 MG 24 hr capsule Take 1 capsule (240 mg total) by mouth 2 (two) times daily.  60 capsule  11  . diltiazem (CARDIZEM) 30 MG tablet Take 30 mg by mouth as needed.      . fluticasone (FLONASE) 50 MCG/ACT nasal spray  Place 2 sprays into the nose daily as needed for rhinitis.  48 g  0  . furosemide (LASIX) 40 MG tablet Take 1 tablet (40 mg total) by mouth 2 (two) times daily.  60 tablet  4  . levalbuterol (XOPENEX HFA) 45 MCG/ACT inhaler 2 puffs four times daily as needed rescue inhaler  3 Inhaler  3  . Lutein 20 MG CAPS Take 1 capsule by mouth daily.        . Magnesium 200 MG TABS Take 200 mg by mouth daily.      . montelukast (SINGULAIR) 10 MG tablet TAKE ONE TABLET AT BEDTIME.  90 tablet  0  . Multiple Vitamin (MULTIVITAMIN) tablet Take 1 tablet by mouth daily.        . Multiple Vitamins-Minerals (PRESERVISION AREDS PO) Take 2 tablets by mouth daily.        . Naproxen Sodium (ALEVE) 220 MG CAPS Take 2 capsules by mouth daily.      . propafenone (RYTHMOL SR) 325 MG 12 hr capsule Take 1 capsule (325 mg total) by mouth 2 (two) times daily.  60 capsule  4   No current facility-administered medications for this visit.    Allergies  Allergen Reactions  . Penicillins Hives  . Brimonidine Tartrate-Timolol     REACTION: systemic malaise.    Past Medical History  Diagnosis Date  . GERD (gastroesophageal reflux disease)   . Obstructive chronic bronchitis with exacerbation     Jody Blake  . Rhinitis   . Hypertension   . Psoriasis   . Diverticulosis   . Asthmatic bronchitis   . Pure hypercholesterolemia   . Macular degeneration     Jody Blake  . Aortic valve disorders   . Unspecified constipation   . Osteoarthrosis, unspecified whether generalized or localized, other specified sites   . Low back pain   . Abnormality of gait   . Encounter for long-term (current) use of other medications   . Staghorn calculus     Left  . Benign hypertensive kidney disease with chronic kidney disease stage I through stage IV, or unspecified(403.10)   . COPD (chronic obstructive pulmonary disease)     Jody Blake  . PAF (paroxysmal atrial fibrillation)     Stopped flecainide, on amiodarone but still has bouts of A FIB  (mostly in mornings).   . Tachy-brady syndrome     s/p permanent pacemaker 06/27/1999 (Battery change 06/2007)    Past Surgical History  Procedure Laterality Date  . Partital nephrectomy Left 1976    stone disease  . Appendectomy    . Bunionectomy with hammertoe reconstruction    . Total knee arthroplasty Right   . Incisional hernia repair    . Cataract extraction, bilateral    . Tonsillectomy and adenoidectomy    . Cardiac pacemaker placement  06/27/99    Medtronic PM implanted by Jody Blake    History  Smoking status  . Never Smoker   Smokeless tobacco  . Not on file    History  Alcohol Use  . Yes    Comment: Rarely    Family History  Problem Relation Age of Onset  . Malignant hypertension Father   . Hypertension Father   . Renal Disease Father   . Breast cancer Mother   . Heart attack Brother   . Stroke Brother     Review of Systems: The review of systems is per the HPI.  All other systems were reviewed and are negative.  Physical Exam: BP 130/90  Pulse 114  Ht 5\' 4"  (1.626 m)  Wt 135 lb 1.9 oz (61.29 kg)  BMI 23.18 kg/m2 Patient is very pleasant and in no acute distress. Looks younger than her stated age. Skin is warm and dry. Color is normal.  HEENT is unremarkable. Normocephalic/atraumatic. PERRL. Sclera are nonicteric. Neck is supple. No masses. No JVD. Lungs are clear. Cardiac exam shows an irregular rate and rhythm. Tachycardic on exam.  Abdomen is soft. Extremities are with trace edema. Gait and ROM are intact. No gross neurologic deficits noted.  LABORATORY DATA: EKG today shows atrial fib/flutter with RVR - rate of 114    Chemistry      Component Value Date/Time   NA 137 03/29/2013 1131   K 3.2* 03/29/2013 1131   CL 93* 03/29/2013 1131   CO2 31 03/29/2013 1131   BUN 33* 03/29/2013 1131   CREATININE 1.3* 03/29/2013 1131      Component Value Date/Time   CALCIUM 9.3 03/29/2013 1131   ALKPHOS 55 09/05/2012 2340   AST 35 09/05/2012 2340   ALT 25  09/05/2012 2340   BILITOT 0.3 09/05/2012 2340     Lab Results  Component Value Date   WBC 8.3 03/29/2013   HGB 10.4* 03/29/2013   HCT 31.9* 03/29/2013   MCV 91.9 03/29/2013   PLT 354.0 03/29/2013  No results found for this basename: CHOL,  HDL,  LDLCALC,  LDLDIRECT,  TRIG,  CHOLHDL   Echo Study Conclusions from June 2014  - Left ventricle: The cavity size was normal. There was mild concentric hypertrophy. Systolic function was normal. The estimated ejection fraction was in the range of 55% to 60%. Wall motion was normal; there were no regional wall motion abnormalities. Features are consistent with a pseudonormal left ventricular filling pattern, with concomitant abnormal relaxation and increased filling pressure (grade 2 diastolic dysfunction). - Aortic valve: Moderate diffuse thickening and calcification, consistent with sclerosis. Valve mobility was moderately restricted. There was moderate stenosis. Mild regurgitation. Valve area: 1.19cm^2(VTI). Valve area: 1.04cm^2 (Vmax). - Mitral valve: Calcified annulus. - Left atrium: The atrium was mildly dilated. - Right ventricle: The cavity size was mildly dilated. Wall thickness was normal. - Right atrium: The atrium was mildly to moderately dilated. - Tricuspid valve: Moderate regurgitation. - Pulmonary arteries: PA peak pressure: 51mm Hg (S).  Assessment / Plan:  1. PAF/flutter - out of rhythm today - quite symptomatic - already failed on flecainide and amiodarone - now on fairly good dose of Rythmol and still with breakthrough arrhythmia - remains on her Pradaxa - noted to have had fatigue with Timolol but she tells me she had more increased BP - I have discussed her care with Jody. Marlou Blake who is in the office this afternoon - will add low dose Toprol at 25 mg daily and increase the Rythmol to 425 mg BID - she will start the Toprol today and they Rythmol tomorrow morning. Will recheck her labs today - may need her potassium  replaced as well.   2. HTN - still with room to adjust medicines as needed.   3. Diastolic dysfunction  4. CKD  Medicines are adjusted as noted above. Will try to get her OV with Jody. Rayann Blake moved up - she may be more willing to proceed on with ablation and seems like she would consider Tikosyn as well. Appointment moved up to January 8th.   Patient is agreeable to this plan and will call if any problems develop in the interim.   Burtis Junes, RN, [facility] [address] [place], [place]  [address]   Addendum: Her labs were noted - more anemic. Quite hypothyroid as well. Will get her started on low dose Synthroid - get her to see Jody. Felipa Eth. When her labs were called to her she reported that she was back in rhythm with a heart rate of 70. Will use the Toprol now only if needed for recurrent spell. Continue with the higher dose of Rythmol.

## 2013-04-12 NOTE — Telephone Encounter (Signed)
11:45  Called pt.  Nurse was there taking her BP and HR.  BP 150/80; HR apically 101 for 1 min.  Pt states she can't tell any difference in how she feels.  Spoke w/Dr. Meda Coffee who advises for her to come in today to see Kathrene Alu.  Pt agreeable and will be here at 1:30.

## 2013-04-12 NOTE — Telephone Encounter (Signed)
Called stating she has been in Afib since 5 pm yesterday. States over the last week HR has been fast and irreg. Took extra Cardizem 30 mg this AM. Still feels weak,lighteded.  She lives at [facility] and she had nurse come in to check HR.  States HR was 99-102.  Has pacemaker.  Spoke w/Dr. Meda Coffee (DOD) who advises for her to take an extra Rythmol 325 mg now (9:30) and call us back in 2 hrs (11:30)  Advised her to have nurse check her HR before she calls.

## 2013-04-12 NOTE — Addendum Note (Signed)
Addended by: Burtis Junes on: 04/12/2013 02:14 PM   Modules accepted: Orders

## 2013-04-12 NOTE — Progress Notes (Addendum)
Agree with above. TSH elevated. Hypothyroid. Perhaps an adjustment in her Synthroid will be beneficial. She will be seeing Dr. [name] soon. Appreciate excellent care.   Addendum from Dr. Marlou Porch - April 19, 2013  Jody Furbish, MD at 04/19/2013 10:09 AM     Status: Signed        Spoke with Dr. Felipa Eth, primary physician. Her hemoglobin is now in the 9 range. Heme-positive stool on exam. He is getting her set up with proton pump inhibitor and gastroenterology evaluation. For the time being because of her drop in hemoglobin from a baseline of 13 down to 9, she is going to hold her Xarelto 15 mg once a day. We discussed this. Risk versus benefit. Increased risk of stroke. However she does have a bleeding risk involved. I also discussed with him her elevated TSH and new start Synthroid. Once her hemoglobin becomes stable and mildly elevated from her current nadir, it would not be unreasonable to resume his overall toe with very close monitoring. We discussed.

## 2013-04-15 ENCOUNTER — Telehealth: Payer: Self-pay | Admitting: Cardiology

## 2013-04-15 ENCOUNTER — Ambulatory Visit (INDEPENDENT_AMBULATORY_CARE_PROVIDER_SITE_OTHER): Payer: Medicare Other | Admitting: Internal Medicine

## 2013-04-15 ENCOUNTER — Encounter: Payer: Self-pay | Admitting: Internal Medicine

## 2013-04-15 VITALS — BP 122/74 | HR 64 | Ht 64.0 in | Wt 138.1 lb

## 2013-04-15 DIAGNOSIS — I10 Essential (primary) hypertension: Secondary | ICD-10-CM | POA: Diagnosis not present

## 2013-04-15 DIAGNOSIS — Z950 Presence of cardiac pacemaker: Secondary | ICD-10-CM | POA: Diagnosis not present

## 2013-04-15 DIAGNOSIS — I4891 Unspecified atrial fibrillation: Secondary | ICD-10-CM

## 2013-04-15 DIAGNOSIS — I495 Sick sinus syndrome: Secondary | ICD-10-CM | POA: Diagnosis not present

## 2013-04-15 NOTE — Telephone Encounter (Signed)
Message copied by Aleksis Jiggetts, Jerl Mina on Thu Apr 15, 2013  9:02 AM ------      Message from: Candee Furbish C      Created: Mon Mar 29, 2013  4:08 PM       Potassium 3.2. Please prescribe potassium chloride 20 mEq daily. Serum creatinine 1.3, slightly increased. We have seen one point for approximately 6 months ago. This could be from increasing Lasix. Let us move Lasix back to once a day. Let me know however if edema worsens. We may need to tolerate slightly increased creatinine. ------

## 2013-04-15 NOTE — Patient Instructions (Addendum)
Your physician recommends that you schedule a follow-up appointment in: 6 weeks with Dr [name]  Your physician has recommended you make the following change in your medication:  1) Stop Pradaxa 2) Start Xarelto 15mg  daily

## 2013-04-16 DIAGNOSIS — D649 Anemia, unspecified: Secondary | ICD-10-CM | POA: Diagnosis not present

## 2013-04-16 DIAGNOSIS — D539 Nutritional anemia, unspecified: Secondary | ICD-10-CM | POA: Diagnosis not present

## 2013-04-16 DIAGNOSIS — E039 Hypothyroidism, unspecified: Secondary | ICD-10-CM | POA: Diagnosis not present

## 2013-04-18 NOTE — Progress Notes (Signed)
PCP: Mathews Argyle, MD Primary Cardiologist: Cristy Folks Jody Blake is a 80 y.o. female who presents today for routine electrophysiology followup. She recently saw Truitt Merle 04/12/13.  Her toprol and rhythmol were both increased.  She has done well since then.  She does not wish to make any additional changes at this time. Today, she denies symptoms of chest pain, shortness of breath,  lower extremity edema, dizziness, presyncope, or syncope.  The patient is otherwise without complaint today.   Past Medical History  Diagnosis Date  . GERD (gastroesophageal reflux disease)   . Obstructive chronic bronchitis with exacerbation     Dr. [name]  . Rhinitis   . Hypertension   . Psoriasis   . Diverticulosis   . Asthmatic bronchitis   . Pure hypercholesterolemia   . Macular degeneration     Dr. [name]  . Aortic valve disorders   . Unspecified constipation   . Osteoarthrosis, unspecified whether generalized or localized, other specified sites   . Low back pain   . Abnormality of gait   . Encounter for long-term (current) use of other medications   . Staghorn calculus     Left  . Benign hypertensive kidney disease with chronic kidney disease stage I through stage IV, or unspecified(403.10)   . COPD (chronic obstructive pulmonary disease)     Dr. [name]  . PAF (paroxysmal atrial fibrillation)     Stopped flecainide, on amiodarone but still has bouts of A FIB (mostly in mornings).   . Tachy-brady syndrome     s/p permanent pacemaker 06/27/1999 (Battery change 06/2007)   Past Surgical History  Procedure Laterality Date  . Partital nephrectomy Left 1976    stone disease  . Appendectomy    . Bunionectomy with hammertoe reconstruction    . Total knee arthroplasty Right   . Incisional hernia repair    . Cataract extraction, bilateral    . Tonsillectomy and adenoidectomy    . Cardiac pacemaker placement  06/27/99    Medtronic PM implanted by Dr [name]    Current Outpatient  Prescriptions  Medication Sig Dispense Refill  . budesonide-formoterol (SYMBICORT) 160-4.5 MCG/ACT inhaler Inhale 2 puffs into the lungs 2 (two) times daily. Rinse mouth  30.6 g  3  . cholecalciferol (VITAMIN D) 1000 UNITS tablet Take 2,000 Units by mouth daily.      . dabigatran (PRADAXA) 150 MG CAPS Take 150 mg by mouth 2 (two) times daily.       [name] diltiazem (CARDIZEM CD) 240 MG 24 hr capsule Take 1 capsule (240 mg total) by mouth 2 (two) times daily.  60 capsule  11  . diltiazem (CARDIZEM) 30 MG tablet Take 30 mg by mouth as needed.      . fluticasone (FLONASE) 50 MCG/ACT nasal spray Place 2 sprays into the nose daily as needed for rhinitis.  48 g  0  . furosemide (LASIX) 40 MG tablet Take 1 tablet (40 mg total) by mouth 2 (two) times daily.  60 tablet  4  . levalbuterol (XOPENEX HFA) 45 MCG/ACT inhaler 2 puffs four times daily as needed rescue inhaler  3 Inhaler  3  . levothyroxine (SYNTHROID, LEVOTHROID) 25 MCG tablet Take 1 tablet (25 mcg total) by mouth daily before breakfast.  30 tablet  6  . Lutein 20 MG CAPS Take 1 capsule by mouth daily.        . Magnesium 200 MG TABS Take 200 mg by mouth daily.      [name]  metoprolol succinate (TOPROL XL) 25 MG 24 hr tablet Take 1 tablet (25 mg total) by mouth daily.  30 tablet  6  . montelukast (SINGULAIR) 10 MG tablet TAKE ONE TABLET AT BEDTIME.  90 tablet  0  . Multiple Vitamin (MULTIVITAMIN) tablet Take 1 tablet by mouth daily.        . Multiple Vitamins-Minerals (PRESERVISION AREDS PO) Take 2 tablets by mouth daily.        . Naproxen Sodium (ALEVE) 220 MG CAPS Take 2 capsules by mouth daily.      . propafenone (RYTHMOL SR) 425 MG 12 hr capsule Take 1 capsule (425 mg total) by mouth 2 (two) times daily.  60 capsule  6   No current facility-administered medications for this visit.    Physical Exam: Filed Vitals:   04/15/13 1100  BP: 122/74  Pulse: 64  Height: 5\' 4"  (1.626 m)  Weight: 138 lb 1.9 oz (62.651 kg)    GEN- The patient is well  appearing, alert and oriented x 3 today.   Head- normocephalic, atraumatic Eyes-  Sclera clear, conjunctiva pink Ears- hearing intact Oropharynx- clear Lungs- Clear to ausculation bilaterally, normal work of breathing Chest- pacemaker pocket is well healed Heart- Regular rate and rhythm, no murmurs, rubs or gallops, PMI not laterally displaced GI- soft, NT, ND, + BS Extremities- no clubbing, cyanosis, or edema  Pacemaker interrogation- reviewed in detail today,  See PACEART report  Assessment and Plan:  1. Tachy/brady Normal pacemaker function See Pace Art report No changes today  2.  Atrial fibrillation Continue her current therapy.  If she fails her current therapy with Phyllis Ginger may be considered. Given her advanced age, I am reluctant to consider ablation Today, I discussed novel anticoagulants including pradaxa, xarelto, and eliquis today as indicated for risk reduction in stroke and systemic emboli with nonvalvular atrial fibrillation.  Risks, benefits, and alternatives to each of these drugs were discussed at length today.  Given her advanced age and trends towards increase bleeding with pradaxa, I will switch to xarelto today.  As her CrCl is <50, will start xarelto 15mg  daily.  3.  HTN Stable No change required today  Return in 6 weeks

## 2013-04-19 ENCOUNTER — Telehealth: Payer: Self-pay | Admitting: Cardiology

## 2013-04-19 DIAGNOSIS — D500 Iron deficiency anemia secondary to blood loss (chronic): Secondary | ICD-10-CM | POA: Diagnosis not present

## 2013-04-19 DIAGNOSIS — K921 Melena: Secondary | ICD-10-CM | POA: Diagnosis not present

## 2013-04-19 DIAGNOSIS — D649 Anemia, unspecified: Secondary | ICD-10-CM | POA: Diagnosis not present

## 2013-04-19 DIAGNOSIS — E039 Hypothyroidism, unspecified: Secondary | ICD-10-CM | POA: Diagnosis not present

## 2013-04-19 NOTE — Telephone Encounter (Signed)
Spoke with Dr. Felipa Eth, primary physician. Her hemoglobin is now in the 9 range. Heme-positive stool on exam. He is getting her set up with proton pump inhibitor and gastroenterology evaluation. For the time being because of her drop in hemoglobin from a baseline of 13 down to 9, she is going to hold her Xarelto 15 mg once a day. We discussed this. Risk versus benefit. Increased risk of stroke. However she does have a bleeding risk involved. I also discussed with him her elevated TSH and new start Synthroid. Once her hemoglobin becomes stable and mildly elevated from her current nadir, it would not be unreasonable to resume his overall toe with very close monitoring. We discussed.

## 2013-04-20 ENCOUNTER — Other Ambulatory Visit: Payer: Self-pay | Admitting: Gastroenterology

## 2013-04-20 DIAGNOSIS — D509 Iron deficiency anemia, unspecified: Secondary | ICD-10-CM | POA: Diagnosis not present

## 2013-04-20 DIAGNOSIS — R195 Other fecal abnormalities: Secondary | ICD-10-CM | POA: Diagnosis not present

## 2013-04-21 ENCOUNTER — Encounter (HOSPITAL_COMMUNITY): Payer: Self-pay | Admitting: *Deleted

## 2013-04-21 ENCOUNTER — Encounter (HOSPITAL_COMMUNITY): Payer: Self-pay | Admitting: Pharmacy Technician

## 2013-04-22 ENCOUNTER — Encounter (HOSPITAL_COMMUNITY): Payer: Self-pay

## 2013-04-22 ENCOUNTER — Ambulatory Visit (HOSPITAL_COMMUNITY)
Admission: RE | Admit: 2013-04-22 | Discharge: 2013-04-22 | Disposition: A | Discharge status: Home / Self Care | Payer: Medicare Other | Source: Ambulatory Visit | Attending: Gastroenterology | Admitting: Gastroenterology

## 2013-04-22 ENCOUNTER — Ambulatory Visit (HOSPITAL_COMMUNITY): Payer: Medicare Other | Admitting: Registered Nurse

## 2013-04-22 ENCOUNTER — Encounter (HOSPITAL_COMMUNITY): Payer: Medicare Other | Admitting: Registered Nurse

## 2013-04-22 ENCOUNTER — Encounter (HOSPITAL_COMMUNITY)
Admission: RE | Disposition: A | Discharge status: Home / Self Care | Payer: Self-pay | Source: Ambulatory Visit | Attending: Gastroenterology

## 2013-04-22 DIAGNOSIS — K5730 Diverticulosis of large intestine without perforation or abscess without bleeding: Secondary | ICD-10-CM | POA: Insufficient documentation

## 2013-04-22 DIAGNOSIS — J4489 Other specified chronic obstructive pulmonary disease: Secondary | ICD-10-CM | POA: Insufficient documentation

## 2013-04-22 DIAGNOSIS — D131 Benign neoplasm of stomach: Secondary | ICD-10-CM | POA: Diagnosis not present

## 2013-04-22 DIAGNOSIS — Z87442 Personal history of urinary calculi: Secondary | ICD-10-CM | POA: Diagnosis not present

## 2013-04-22 DIAGNOSIS — Z950 Presence of cardiac pacemaker: Secondary | ICD-10-CM | POA: Diagnosis not present

## 2013-04-22 DIAGNOSIS — I4891 Unspecified atrial fibrillation: Secondary | ICD-10-CM | POA: Insufficient documentation

## 2013-04-22 DIAGNOSIS — I10 Essential (primary) hypertension: Secondary | ICD-10-CM | POA: Insufficient documentation

## 2013-04-22 DIAGNOSIS — D509 Iron deficiency anemia, unspecified: Secondary | ICD-10-CM | POA: Insufficient documentation

## 2013-04-22 DIAGNOSIS — K219 Gastro-esophageal reflux disease without esophagitis: Secondary | ICD-10-CM | POA: Diagnosis not present

## 2013-04-22 DIAGNOSIS — J449 Chronic obstructive pulmonary disease, unspecified: Secondary | ICD-10-CM | POA: Insufficient documentation

## 2013-04-22 DIAGNOSIS — Z905 Acquired absence of kidney: Secondary | ICD-10-CM | POA: Diagnosis not present

## 2013-04-22 DIAGNOSIS — E038 Other specified hypothyroidism: Secondary | ICD-10-CM | POA: Diagnosis not present

## 2013-04-22 DIAGNOSIS — R195 Other fecal abnormalities: Secondary | ICD-10-CM | POA: Diagnosis not present

## 2013-04-22 HISTORY — DX: Hypothyroidism, unspecified: E03.9

## 2013-04-22 HISTORY — PX: COLONOSCOPY WITH PROPOFOL: SHX5780

## 2013-04-22 HISTORY — PX: ESOPHAGOGASTRODUODENOSCOPY (EGD) WITH PROPOFOL: SHX5813

## 2013-04-22 SURGERY — ESOPHAGOGASTRODUODENOSCOPY (EGD) WITH PROPOFOL
Anesthesia: Monitor Anesthesia Care

## 2013-04-22 MED ORDER — LABETALOL HCL 5 MG/ML IV SOLN
INTRAVENOUS | Status: DC | PRN
  Administered 2013-04-22 (×2): 2.5 mg via INTRAVENOUS

## 2013-04-22 MED ORDER — FENTANYL CITRATE 0.05 MG/ML IJ SOLN
INTRAMUSCULAR | Status: DC | PRN
  Administered 2013-04-22: 50 ug via INTRAVENOUS

## 2013-04-22 MED ORDER — PROPOFOL 10 MG/ML IV BOLUS
INTRAVENOUS | Status: AC
  Filled 2013-04-22: qty 20

## 2013-04-22 MED ORDER — KETAMINE HCL 10 MG/ML IJ SOLN
INTRAMUSCULAR | Status: DC | PRN
  Administered 2013-04-22: 20 mg via INTRAVENOUS

## 2013-04-22 MED ORDER — FENTANYL CITRATE 0.05 MG/ML IJ SOLN
INTRAMUSCULAR | Status: AC
  Filled 2013-04-22: qty 2

## 2013-04-22 MED ORDER — LACTATED RINGERS IV SOLN
INTRAVENOUS | Status: DC | PRN
  Administered 2013-04-22: 13:00:00 via INTRAVENOUS

## 2013-04-22 MED ORDER — KETAMINE HCL 10 MG/ML IJ SOLN
INTRAMUSCULAR | Status: AC
  Filled 2013-04-22: qty 1

## 2013-04-22 MED ORDER — BUTAMBEN-TETRACAINE-BENZOCAINE 2-2-14 % EX AERO
INHALATION_SPRAY | CUTANEOUS | Status: DC | PRN
  Administered 2013-04-22: 2 via TOPICAL

## 2013-04-22 MED ORDER — PROPOFOL INFUSION 10 MG/ML OPTIME
INTRAVENOUS | Status: DC | PRN
  Administered 2013-04-22: 75 ug/kg/min via INTRAVENOUS

## 2013-04-22 MED ORDER — SODIUM CHLORIDE 0.9 % IV SOLN: INTRAVENOUS | Status: DC

## 2013-04-22 MED ORDER — LIDOCAINE HCL (CARDIAC) 20 MG/ML IV SOLN
INTRAVENOUS | Status: AC
  Filled 2013-04-22: qty 5

## 2013-04-22 SURGICAL SUPPLY — 24 items

## 2013-04-22 NOTE — Transfer of Care (Signed)
Immediate Anesthesia Transfer of Care Note  Patient: Jody Blake  Procedure(s) Performed: Procedure(s): ESOPHAGOGASTRODUODENOSCOPY (EGD) WITH PROPOFOL (N/A) COLONOSCOPY WITH PROPOFOL (N/A)  Patient Location: PACU  Anesthesia Type:MAC  Level of Consciousness: awake, alert , oriented and patient cooperative  Airway & Oxygen Therapy: Patient Spontanous Breathing and Patient connected to nasal cannula oxygen  Post-op Assessment: Report given to PACU RN, Post -op Vital signs reviewed and stable and Patient moving all extremities X 4  Post vital signs: stable  Complications: No apparent anesthesia complications

## 2013-04-22 NOTE — Anesthesia Postprocedure Evaluation (Signed)
Anesthesia Post Note  Patient: Jody Blake  Procedure(s) Performed: Procedure(s) (LRB): ESOPHAGOGASTRODUODENOSCOPY (EGD) WITH PROPOFOL (N/A) COLONOSCOPY WITH PROPOFOL (N/A)  Anesthesia type: MAC  Patient location: PACU  Post pain: Pain level controlled  Post assessment: Post-op Vital signs reviewed  Last Vitals: BP 162/81  Pulse 60  Temp(Src) 36.5 C (Oral)  Resp 14  SpO2 96%  Post vital signs: Reviewed  Level of consciousness: awake  Complications: No apparent anesthesia complications

## 2013-04-22 NOTE — Anesthesia Preprocedure Evaluation (Addendum)
Anesthesia Evaluation  Patient identified by MRN, date of birth, ID band Patient awake    Reviewed: Allergy & Precautions, H&P , NPO status , Patient's Chart, lab work & pertinent test results  Airway Mallampati: II TM Distance: >3 FB Neck ROM: Full    Dental no notable dental hx.    Pulmonary neg pulmonary ROS, COPD breath sounds clear to auscultation  Pulmonary exam normal       Cardiovascular hypertension, Pt. on medications negative cardio ROS  + dysrhythmias Atrial Fibrillation + pacemaker Rhythm:Regular Rate:Normal     Neuro/Psych negative neurological ROS  negative psych ROS   GI/Hepatic negative GI ROS, Neg liver ROS,   Endo/Other  negative endocrine ROSHypothyroidism   Renal/GU negative Renal ROS  negative genitourinary   Musculoskeletal negative musculoskeletal ROS (+)   Abdominal   Peds negative pediatric ROS (+)  Hematology negative hematology ROS (+)   Anesthesia Other Findings   Reproductive/Obstetrics negative OB ROS                         Anesthesia Physical Anesthesia Plan  ASA: IV  Anesthesia Plan: MAC   Post-op Pain Management:    Induction:   Airway Management Planned: Natural Airway  Additional Equipment:   Intra-op Plan:   Post-operative Plan:   Informed Consent: I have reviewed the patients History and Physical, chart, labs and discussed the procedure including the risks, benefits and alternatives for the proposed anesthesia with the patient or authorized representative who has indicated his/her understanding and acceptance.   Dental advisory given  Plan Discussed with: CRNA  Anesthesia Plan Comments:        Anesthesia Quick Evaluation

## 2013-04-22 NOTE — Discharge Instructions (Signed)
Gastrointestinal Endoscopy °Care After °Refer to this sheet in the next few weeks. These instructions provide you with information on caring for yourself after your procedure. Your caregiver may also give you more specific instructions. Your treatment has been planned according to current medical practices, but problems sometimes occur. Call your caregiver if you have any problems or questions after your procedure. °HOME CARE INSTRUCTIONS °· If you were given medicine to help you relax (sedative), do not drive, operate machinery, or sign important documents for 24 hours. °· Avoid alcohol and hot or warm beverages for the first 24 hours after the procedure. °· Only take over-the-counter or prescription medicines for pain, discomfort, or fever as directed by your caregiver. You may resume taking your normal medicines unless your caregiver tells you otherwise. Ask your caregiver when you may resume taking medicines that may cause bleeding, such as aspirin, clopidogrel, or warfarin. °· You may return to your normal diet and activities on the day after your procedure, or as directed by your caregiver. Walking may help to reduce any bloated feeling in your abdomen. °· Drink enough fluids to keep your urine clear or pale yellow. °· You may gargle with salt water if you have a sore throat. °SEEK IMMEDIATE MEDICAL CARE IF: °· You have severe nausea or vomiting. °· You have severe abdominal pain, abdominal cramps that last longer than 6 hours, or abdominal swelling (distention). °· You have severe shoulder or back pain. °· You have trouble swallowing. °· You have shortness of breath, your breathing is shallow, or you are breathing faster than normal. °· You have a fever or a rapid heartbeat. °· You vomit blood or material that looks like coffee grounds. °· You have bloody, black, or tarry stools. °MAKE SURE YOU: °· Understand these instructions. °· Will watch your condition. °· Will get help right away if you are not doing  well or get worse. °Document Released: 11/07/2003 Document Revised: 09/24/2011 Document Reviewed: 06/25/2011 °[employer]® Patient Information ©2014 [employer]. °Colonoscopy °Care After °These instructions give you information on caring for yourself after your procedure. Your doctor may also give you more specific instructions. Call your doctor if you have any problems or questions after your procedure. °HOME CARE °· Take it easy for the next 24 hours. °· Rest. °· Walk or use warm packs on your belly (abdomen) if you have belly cramping or gas. °· Do not drive for 24 hours. °· You may shower. °· Do not sign important papers or use machinery for 24 hours. °· Drink enough fluids to keep your pee (urine) clear or pale yellow. °· Resume your normal diet. Avoid heavy or fried foods. °· Avoid alcohol. °· Continue taking your normal medicines. °· Only take medicine as told by your doctor. Do not take aspirin. °If you had growths (polyps) removed: °· Do not take aspirin. °· Do not drink alcohol for 7 days or as told by your doctor. °· Eat a soft diet for 24 hours. °GET HELP RIGHT AWAY IF: °· You have a fever. °· You pass clumps of tissue (blood clots) or fill the toilet with blood. °· You have belly pain that gets worse and medicine does not help. °· Your belly is puffy (swollen). °· You feel sick to your stomach (nauseous) or throw up (vomit). °MAKE SURE YOU: °· Understand these instructions. °· Will watch your condition. °· Will get help right away if you are not doing well or get worse. °Document Released: 04/27/2010 Document Revised: 06/17/2011 Document Reviewed:   11/30/2012 °[employer]® Patient Information ©2014 [employer]. ° °

## 2013-04-22 NOTE — H&P (Signed)
Problems: Iron deficiency anemia and heme positive stool on as xarelto and Aleve  History: The patient is an 80 year old female born 1926/03/23. The patient has chronic atrial fibrillation and takes xarelto on a daily basis. She has developed iron deficiency anemia and heme positive stool.  The patient is scheduled to undergo a diagnostic esophagogastroduodenoscopy and colonoscopy.  Past medical history: Hypertension. Left staghorn calculus. Gastroesophageal reflux. Psoriasis. Colonic diverticulosis. Chronic atrial fibrillation. Permanent pacemaker placement in 2001. Asthmatic bronchitis macular degeneration. Primary hypothyroidism. Partial left nephrectomy for staghorn calculus. Total right knee replacement surgery. Appendectomy. Bilateral cataract surgeries. Tonsillectomy. Bunion surgery.  Medication allergies: Penicillin and Combigan  Exam: The patient is alert and lying comfortably on the endoscopy stretcher. Abdomen is soft and nontender to palpation.  Plan: Proceed with diagnostic esophagogastroduodenoscopy and colonoscopy.

## 2013-04-22 NOTE — Op Note (Signed)
Problems: Iron deficiency anemia and heme positive stool on Aleve and xarelto  Endoscopist: Earle Gell  Premedication: Propofol administered by anesthesia  Procedure: Diagnostic esophagogastroduodenoscopy  The patient was placed in the left lateral decubitus position. The Pentax gastroscope was passed through the posterior hypopharynx into the proximal esophagus without difficulty. The hypopharynx, larynx, and vocal cords appeared normal.  Esophagoscopy: The proximal, mid, and lower segments of the esophageal mucosa appear normal. The squamocolumnar junction is regular and noted at 40 cm from the incisor teeth.  Gastroscopy: Retroflex view of the gastric cardia  was normal. The gastric body, antrum, and pylorus appeared normal. There are many 3-4 mm sized benign appearing fundic gland type polyps in the gastric fundus  Duodenoscopy: The duodenal bulb and descending duodenum appeared normal  Assessment: Normal esophagogastroduodenoscopy. Many small benign appearing fundic gland polyps were present in the gastric fundus  Procedure: Diagnostic colonoscopy  The patient was placed in the left lateral decubitus position. The ultrathin Pentax colonoscope was introduced into the rectum and advanced to the cecum with a great deal of difficulty due to colonic loop formation. Colonic preparation for the exam today was good  Rectum. Normal. Retroflexed view of the distal rectum normal  Sigmoid colon and descending colon. Normal  Splenic flexure. Normal  Transverse colon. Normal  Hepatic flexure. Normal  Ascending colon. Normal  Cecum and ileocecal valve. Normal  Assessment  #1. Universal colonic diverticulosis  #2. Normal diagnostic proctocolonoscopy to the cecum

## 2013-04-22 NOTE — Preoperative (Signed)
Beta Blockers   Reason not to administer Beta Blockers:toprol taken at 1900 04-21-13

## 2013-04-23 ENCOUNTER — Encounter (HOSPITAL_COMMUNITY): Payer: Self-pay | Admitting: Gastroenterology

## 2013-04-26 DIAGNOSIS — D509 Iron deficiency anemia, unspecified: Secondary | ICD-10-CM | POA: Diagnosis not present

## 2013-04-26 DIAGNOSIS — K921 Melena: Secondary | ICD-10-CM | POA: Diagnosis not present

## 2013-04-28 ENCOUNTER — Encounter: Payer: Medicare Other | Admitting: Internal Medicine

## 2013-05-03 DIAGNOSIS — E039 Hypothyroidism, unspecified: Secondary | ICD-10-CM | POA: Diagnosis not present

## 2013-05-03 DIAGNOSIS — D509 Iron deficiency anemia, unspecified: Secondary | ICD-10-CM | POA: Diagnosis not present

## 2013-05-19 ENCOUNTER — Ambulatory Visit: Payer: Medicare Other | Admitting: Cardiology

## 2013-05-20 DIAGNOSIS — D509 Iron deficiency anemia, unspecified: Secondary | ICD-10-CM | POA: Diagnosis not present

## 2013-05-20 DIAGNOSIS — E039 Hypothyroidism, unspecified: Secondary | ICD-10-CM | POA: Diagnosis not present

## 2013-05-27 ENCOUNTER — Telehealth: Payer: Self-pay | Admitting: Internal Medicine

## 2013-05-27 MED ORDER — ALBUTEROL SULFATE HFA 108 (90 BASE) MCG/ACT IN AERS: 2.0000 | INHALATION_SPRAY | Freq: Four times a day (QID) | RESPIRATORY_TRACT | Status: DC | PRN

## 2013-05-27 NOTE — Telephone Encounter (Signed)
I called and spoke with Jody Blake. She reports her symbicort and xopenex is not covered. She does not have formulary and not formulary with dealing with insurance. Please advise alternative Dr. [name] thanks  Allergies  Allergen Reactions  . Penicillins Hives  . Brimonidine Tartrate-Timolol     REACTION: systemic malaise amigen eye drop

## 2013-05-27 NOTE — Telephone Encounter (Signed)
Pt is aware of CY's recs. Rx has been sent in. 

## 2013-05-27 NOTE — Telephone Encounter (Signed)
Substitute albuterol HFA for Xopenex, using 1 or 2 puffs, every 6 hours only if needed as rescue inhaler, # 1, ref prn  Try without Symbicort. She might not need it on a maintenance basis now.

## 2013-05-28 ENCOUNTER — Encounter: Payer: Self-pay | Admitting: Internal Medicine

## 2013-05-28 ENCOUNTER — Ambulatory Visit (INDEPENDENT_AMBULATORY_CARE_PROVIDER_SITE_OTHER): Payer: Medicare Other | Admitting: Internal Medicine

## 2013-05-28 VITALS — BP 136/88 | HR 65 | Ht 64.0 in | Wt 138.0 lb

## 2013-05-28 DIAGNOSIS — I4891 Unspecified atrial fibrillation: Secondary | ICD-10-CM | POA: Diagnosis not present

## 2013-05-28 DIAGNOSIS — I10 Essential (primary) hypertension: Secondary | ICD-10-CM | POA: Diagnosis not present

## 2013-05-28 DIAGNOSIS — I495 Sick sinus syndrome: Secondary | ICD-10-CM | POA: Diagnosis not present

## 2013-05-28 LAB — MDC_IDC_ENUM_SESS_TYPE_INCLINIC
Battery Voltage: 2.96 V
Brady Statistic AP VP Percent: 0.51 %
Brady Statistic AP VS Percent: 94.3 %
Brady Statistic AS VP Percent: 0.3 %
Brady Statistic AS VS Percent: 4.9 %
Date Time Interrogation Session: 20150220125236
Lead Channel Impedance Value: 488 Ohm
Lead Channel Sensing Intrinsic Amplitude: 1.1394
Lead Channel Sensing Intrinsic Amplitude: 4.4013
Lead Channel Setting Pacing Amplitude: 2 V
Lead Channel Setting Pacing Pulse Width: 0.6 ms
Lead Channel Setting Sensing Sensitivity: 0.9 mV
MDC IDC MSMT LEADCHNL RA IMPEDANCE VALUE: 496 Ohm
MDC IDC SET LEADCHNL RA PACING AMPLITUDE: 2 V
MDC IDC SET ZONE DETECTION INTERVAL: 200 ms
MDC IDC STAT BRADY RA PERCENT PACED: 94.81 %
MDC IDC STAT BRADY RV PERCENT PACED: 0.8 %
Zone Setting Detection Interval: 400 ms
Zone Setting Detection Interval: 400 ms

## 2013-05-28 NOTE — Patient Instructions (Signed)
Your physician recommends that you schedule a follow-up appointment in: 3 months with Dr Marlou Porch and 6 months with Dr [name]

## 2013-05-30 NOTE — Progress Notes (Signed)
PCP: Mathews Argyle, MD Primary Cardiologist: Cristy Folks Jody Blake is a 80 y.o. female who presents today for routine electrophysiology followup.  She has done well recently.  She does not wish to make any additional changes at this time. Today, she denies symptoms of chest pain, shortness of breath,  lower extremity edema, dizziness, presyncope, or syncope.  The patient is otherwise without complaint today.   Past Medical History  Diagnosis Date  . Obstructive chronic bronchitis with exacerbation     Dr. [name]  . Rhinitis   . Hypertension   . Diverticulosis   . Asthmatic bronchitis   . Pure hypercholesterolemia   . Macular degeneration     Dr. [name]  . Aortic valve disorders   . Unspecified constipation   . Osteoarthrosis, unspecified whether generalized or localized, other specified sites   . Low back pain   . Encounter for long-term (current) use of other medications   . Staghorn calculus     Left  . Benign hypertensive kidney disease with chronic kidney disease stage I through stage IV, or unspecified   . COPD (chronic obstructive pulmonary disease)     Dr. [name]  . PAF (paroxysmal atrial fibrillation)     Stopped flecainide, on amiodarone but still has bouts of A FIB (mostly in mornings).   . Tachy-brady syndrome     s/p permanent pacemaker 06/27/1999 (Battery change 06/2007)  . GERD (gastroesophageal reflux disease)   . Hypothyroidism    Past Surgical History  Procedure Laterality Date  . Partital nephrectomy Left 1976    stone disease  . Appendectomy    . Bunionectomy with hammertoe reconstruction Bilateral   . Total knee arthroplasty Right   . Incisional hernia repair    . Cataract extraction, bilateral    . Tonsillectomy and adenoidectomy    . Cardiac pacemaker placement  06/27/99    Medtronic PM implanted by Dr [name]  . Hernia repair    . Esophagogastroduodenoscopy (egd) with propofol N/A 04/22/2013    Procedure: ESOPHAGOGASTRODUODENOSCOPY (EGD) WITH  PROPOFOL;  Surgeon: [name], MD;  Location: [facility];  Service: Endoscopy;  Laterality: N/A;  . Colonoscopy with propofol N/A 04/22/2013    Procedure: COLONOSCOPY WITH PROPOFOL;  Surgeon: [name], MD;  Location: [facility];  Service: Endoscopy;  Laterality: N/A;    Current Outpatient Prescriptions  Medication Sig Dispense Refill  . albuterol (PROVENTIL HFA;VENTOLIN HFA) 108 (90 BASE) MCG/ACT inhaler Inhale 2 puffs into the lungs every 6 (six) hours as needed for wheezing or shortness of breath.  1 Inhaler  prn  . cholecalciferol (VITAMIN D) 1000 UNITS tablet Take 2,000 Units by mouth daily.      [name] diltiazem (CARDIZEM CD) 240 MG 24 hr capsule Take 240 mg by mouth 2 (two) times daily.      [name] diltiazem (CARDIZEM) 30 MG tablet Take 30 mg by mouth as needed (heart irregularity).       . fluticasone (FLONASE) 50 MCG/ACT nasal spray Place 2 sprays into the nose daily as needed for rhinitis.      . furosemide (LASIX) 40 MG tablet Take 40 mg by mouth 2 (two) times daily.      [name] levothyroxine (SYNTHROID, LEVOTHROID) 88 MCG tablet Take 88 mcg by mouth daily before breakfast.      . Lutein 20 MG CAPS Take 1 capsule by mouth daily.       . Magnesium 200 MG TABS Take 200 mg by mouth daily.      [name]  metoprolol succinate (TOPROL-XL) 25 MG 24 hr tablet Take 25 mg by mouth every evening.       . montelukast (SINGULAIR) 10 MG tablet TAKE ONE TABLET AT BEDTIME.      . Multiple Vitamin (MULTIVITAMIN) tablet Take 1 tablet by mouth daily.       . Multiple Vitamins-Minerals (PRESERVISION AREDS PO) Take 2 tablets by mouth daily.       . propafenone (RYTHMOL SR) 425 MG 12 hr capsule Take 425 mg by mouth 2 (two) times daily.      . Rivaroxaban (XARELTO) 15 MG TABS tablet Take 15 mg by mouth daily.       No current facility-administered medications for this visit.    Physical Exam: Filed Vitals:   05/28/13 1214  BP: 162/90  Pulse: 65  Height: 5\' 4"  (1.626 m)  Weight: 138 lb (62.596 kg)     GEN- The patient is well appearing, alert and oriented x 3 today.   Head- normocephalic, atraumatic Eyes-  Sclera clear, conjunctiva pink Ears- hearing intact Oropharynx- clear Lungs- Clear to ausculation bilaterally, normal work of breathing Chest- pacemaker pocket is well healed Heart- Regular rate and rhythm, no murmurs, rubs or gallops, PMI not laterally displaced GI- soft, NT, ND, + BS Extremities- no clubbing, cyanosis, or edema  Pacemaker interrogation- reviewed in detail today,  See PACEART report ekg today reveals atrial pacing at 60 bpm, PR 168, incomplete RBBB  Assessment and Plan:  1. Tachy/brady Normal pacemaker function See Pace Art report No changes today  2.  Atrial fibrillation Afib burden is decreased (presently 6.2%).  She wishes to continue her current therapy.  If she fails her current therapy with Phyllis Ginger may be considered. Given her advanced age, I am reluctant to consider ablation Continue xarelto  3.  HTN Stable No change required today  Return in 6 months Follow-up with Dr Marlou Porch as scheduled

## 2013-06-02 ENCOUNTER — Telehealth: Payer: Self-pay | Admitting: Internal Medicine

## 2013-06-04 MED ORDER — MOMETASONE FURO-FORMOTEROL FUM 100-5 MCG/ACT IN AERO: 2.0000 | INHALATION_SPRAY | Freq: Two times a day (BID) | RESPIRATORY_TRACT | Status: DC

## 2013-06-04 NOTE — Telephone Encounter (Signed)
Spoke with the pt and notified her of change and she verbalized understanding  MAR updated  Rx was sent

## 2013-06-04 NOTE — Telephone Encounter (Signed)
Ok change Symbicort to [employer] 100   # 1, 2puffs and rinse mouth, twice daily   Refill prn

## 2013-06-10 ENCOUNTER — Other Ambulatory Visit: Payer: Self-pay | Admitting: *Deleted

## 2013-06-10 MED ORDER — RIVAROXABAN 15 MG PO TABS: 15.0000 mg | ORAL_TABLET | Freq: Every day | ORAL | Status: DC

## 2013-06-17 ENCOUNTER — Other Ambulatory Visit: Payer: Self-pay | Admitting: *Deleted

## 2013-06-17 MED ORDER — MOMETASONE FURO-FORMOTEROL FUM 200-5 MCG/ACT IN AERO: 2.0000 | INHALATION_SPRAY | Freq: Two times a day (BID) | RESPIRATORY_TRACT | Status: DC

## 2013-06-17 NOTE — Telephone Encounter (Signed)
Ok to use Dulera 200 per CDY due to [facility] 100 on back order.  Pt to follow same insturctions

## 2013-06-19 DIAGNOSIS — I4891 Unspecified atrial fibrillation: Secondary | ICD-10-CM | POA: Diagnosis not present

## 2013-06-19 DIAGNOSIS — J069 Acute upper respiratory infection, unspecified: Secondary | ICD-10-CM | POA: Diagnosis not present

## 2013-06-25 DIAGNOSIS — J40 Bronchitis, not specified as acute or chronic: Secondary | ICD-10-CM | POA: Diagnosis not present

## 2013-06-28 ENCOUNTER — Ambulatory Visit: Payer: Medicare Other | Admitting: Internal Medicine

## 2013-07-01 ENCOUNTER — Encounter: Payer: Self-pay | Admitting: Internal Medicine

## 2013-07-01 ENCOUNTER — Ambulatory Visit (INDEPENDENT_AMBULATORY_CARE_PROVIDER_SITE_OTHER): Payer: Medicare Other | Admitting: Internal Medicine

## 2013-07-01 VITALS — BP 132/78 | HR 73 | Ht 64.25 in | Wt 139.8 lb

## 2013-07-01 DIAGNOSIS — J309 Allergic rhinitis, unspecified: Secondary | ICD-10-CM

## 2013-07-01 DIAGNOSIS — J441 Chronic obstructive pulmonary disease with (acute) exacerbation: Secondary | ICD-10-CM

## 2013-07-01 MED ORDER — LEVALBUTEROL HCL 0.63 MG/3ML IN NEBU
0.6300 mg | INHALATION_SOLUTION | Freq: Once | RESPIRATORY_TRACT | Status: AC
  Administered 2013-07-01: 0.63 mg via RESPIRATORY_TRACT

## 2013-07-01 MED ORDER — METHYLPREDNISOLONE ACETATE 80 MG/ML IJ SUSP
80.0000 mg | Freq: Once | INTRAMUSCULAR | Status: AC
  Administered 2013-07-01: 80 mg via INTRAMUSCULAR

## 2013-07-01 NOTE — Patient Instructions (Signed)
Neb xop 0.63  Depo 80

## 2013-07-01 NOTE — Progress Notes (Signed)
Subjective:    Patient ID: Jody Blake, female    DOB: 1926/03/05, 80 y.o.   MRN: [id number]  HPI 10/11/10- 53 yoF never smoker, followed for allergic rhinitis, chronic bronchitis, complicated by GERD, Hx PAfib/ pacemaker. Last here April 26, 2010. Note reviewed. She says the summer heat hasn't been so bad yet. She has to stop part way up hills or stairs to get her breath. Morning cough is normally. productive of clear phlegm. She walks daily and goes to fitness class twice weekly.  Should check on A1AT status next visit  06/27/11- 80 yoF never smoker, followed for allergic rhinitis, chronic bronchitis, complicated by GERD, Hx PAfib/ pacemaker. Acute visit-cough-productive-mostly clear and gets worse in the afternoon; streaks of blood (tiny amount) from time to time. Having SOB and wheezing as well 2 weeks ago exposed to cold wind outside. Sinus congestion cough malaise and weakness. Had congested with yellow nasal discharge but no headache. Chest feels wheezy with cough. Took one week of doxycycline and Mucinex DM. Denies fever, chills, pain.  06/26/12- 80 yoF never smoker, followed for allergic rhinitis, chronic bronchitis, complicated by GERD, Hx PAfib/ pacemaker. FOLLOWS FOR: SOB and wheezing at times-activity makes it worse; slight cough-productive-clear in color, congestion as well. One episode of winter bronchitis treated and resolved. Feels well now. Grew up in smoking household.  03/03/2013 Acute OV Complains of chest tightness, labored breathing, DOE, some prod cough with pale yellow mucus x3-4weeks.  denies f/c/s, hemoptysis, nausea, vomiting. Was on amiodarone few months ago briefly for Atrial Fib.  Now on Rythmol.  She denies any hemoptysis, orthopnea, PND. Does have chronic lower extremity swelling and is on Lasix. She denies any recent travel or antibiotic use. She has not taken any medications for her cough or congestion.  07/01/13- 80 yoF never smoker, followed for  allergic rhinitis, chronic bronchitis, complicated by GERD, Hx PAfib/ pacemaker. FOLLOWS FOR:  Gettting over the flu and has now turned into bronchitis.  Reports having cough with yellow mucus, wheezing and tightness in chest Treated for flu syndrome - doxy, few erythromycin, prednisone taper, Tamiflu. Much residual cough/ yellow, chest tight. Last abx 4 days ago. CXR 03/03/13 IMPRESSION:  1. There is hyperinflation consistent with known COPD. Stable  blunting of the posterior and lateral costophrenic angles on the  left is present.  2. There is no evidence of focal pneumonia nor evidence of CHF.  Electronically Signed  By: David Martinique  On: 03/03/2013 13:23  Review of Systems Constitutional:   No  weight loss, night sweats,  Fevers, chills, fatigue, or  lassitude.  HEENT:   No headaches,  Difficulty swallowing,  Tooth/dental problems, or  Sore throat,                No sneezing, itching, ear ache, nasal congestion, post nasal drip,   CV:  No chest pain,  Orthopnea, PND,   anasarca, dizziness, palpitations, syncope. +leg swelling   GI  No heartburn, indigestion, abdominal pain, nausea, vomiting,   Resp:    No chest wall deformity  Skin: no rash or lesions.  GU:    MS:  No joint pain or swelling.    Psych:  No change in mood or affect. No depression or anxiety.  No memory loss.    Objective:  OBJ- Physical Exam General- Alert, Oriented, Affect-appropriate, Distress- none acute Skin- rash-none, lesions- none, excoriation- none Lymphadenopathy- none Head- atraumatic            Eyes- Gross vision  intact, PERRLA, conjunctivae and secretions clear            Ears- Hearing, canals-normal            Nose- Clear, no-Septal dev, mucus, polyps, erosion, perforation             Throat- Mallampati II , mucosa clear , drainage- none, tonsils- atrophic Neck- flexible , trachea midline, no stridor , thyroid nl, carotid no bruit Chest - symmetrical excursion , unlabored            Heart/CV- RRR , no murmur , no gallop  , no rub, nl s1 s2                           - JVD- none , edema- none, stasis changes- none, varices- none           Lung- clear to P&A, wheeze- none, cough+light , dullness-none, rub- none           Chest wall-  Abd-  Br/ Gen/ Rectal- Not done, not indicated Extrem- cyanosis- none, clubbing, none, atrophy- none, strength- nl Neuro- grossly intact to observation         Assessment & Plan:

## 2013-07-13 DIAGNOSIS — D509 Iron deficiency anemia, unspecified: Secondary | ICD-10-CM | POA: Diagnosis not present

## 2013-07-13 DIAGNOSIS — I10 Essential (primary) hypertension: Secondary | ICD-10-CM | POA: Diagnosis not present

## 2013-07-13 DIAGNOSIS — I4891 Unspecified atrial fibrillation: Secondary | ICD-10-CM | POA: Diagnosis not present

## 2013-07-13 DIAGNOSIS — D649 Anemia, unspecified: Secondary | ICD-10-CM | POA: Diagnosis not present

## 2013-07-13 DIAGNOSIS — E039 Hypothyroidism, unspecified: Secondary | ICD-10-CM | POA: Diagnosis not present

## 2013-07-16 ENCOUNTER — Other Ambulatory Visit: Payer: Self-pay | Admitting: Internal Medicine

## 2013-07-27 ENCOUNTER — Encounter: Payer: Self-pay | Admitting: Internal Medicine

## 2013-07-27 NOTE — Assessment & Plan Note (Signed)
Post viral bronchitis Plan- neb Xop, depomedrol, medication talk, steroid talk

## 2013-08-12 ENCOUNTER — Ambulatory Visit: Payer: Self-pay | Admitting: Nurse Practitioner

## 2013-08-20 DIAGNOSIS — D3130 Benign neoplasm of unspecified choroid: Secondary | ICD-10-CM | POA: Diagnosis not present

## 2013-08-20 DIAGNOSIS — H3530 Unspecified macular degeneration: Secondary | ICD-10-CM | POA: Diagnosis not present

## 2013-08-25 ENCOUNTER — Ambulatory Visit (INDEPENDENT_AMBULATORY_CARE_PROVIDER_SITE_OTHER): Payer: Medicare Other | Admitting: Cardiology

## 2013-08-25 ENCOUNTER — Encounter: Payer: Self-pay | Admitting: Cardiology

## 2013-08-25 VITALS — BP 123/76 | HR 100 | Ht 64.0 in | Wt 134.0 lb

## 2013-08-25 DIAGNOSIS — I4891 Unspecified atrial fibrillation: Secondary | ICD-10-CM | POA: Diagnosis not present

## 2013-08-25 DIAGNOSIS — I480 Paroxysmal atrial fibrillation: Secondary | ICD-10-CM

## 2013-08-25 DIAGNOSIS — D649 Anemia, unspecified: Secondary | ICD-10-CM

## 2013-08-25 DIAGNOSIS — E039 Hypothyroidism, unspecified: Secondary | ICD-10-CM

## 2013-08-25 DIAGNOSIS — Z7901 Long term (current) use of anticoagulants: Secondary | ICD-10-CM | POA: Diagnosis not present

## 2013-08-25 NOTE — Progress Notes (Signed)
[facility]. [address] [place], [place]  [address] Phone: [phone] Fax:  [phone]  Date:  08/25/2013   ID:  Jody Blake, DOB 02/17/1926, MRN [id number]  PCP:  Mathews Argyle, MD   History of Present Illness: Jody Blake is a 80 y.o. female with paroxysmal atrial fibrillation, 6.2% on recent pacemaker evaluation, seen Dr. Thompson Grayer on chronic anticoagulation. Notes reviewed, reluctant to consider ablation. If current fail therapy, consider tikosyn. Her last adjustment to atrial fibrillation in January of 2015 was increasing her Toprol and Rythmol to  In January 2015, I spoke with Dr. Felipa Eth her primary physician. Her hemoglobin is in the 9 range, heme positive stool. She dropped from 13 down to 9. On anticoagulation at that time. Elevated TSH also discussed, new start Synthroid. Her husband, Jody Blake, has been quite feeble recently.   Wt Readings from Last 3 Encounters:  08/25/13 134 lb (60.782 kg)  07/01/13 139 lb 12.8 oz (63.413 kg)  05/28/13 138 lb (62.596 kg)     Past Medical History  Diagnosis Date  . Obstructive chronic bronchitis with exacerbation     Dr. [name]  . Rhinitis   . Hypertension   . Diverticulosis   . Asthmatic bronchitis   . Pure hypercholesterolemia   . Macular degeneration     Dr. [name]  . Aortic valve disorders   . Unspecified constipation   . Osteoarthrosis, unspecified whether generalized or localized, other specified sites   . Low back pain   . Encounter for long-term (current) use of other medications   . Staghorn calculus     Left  . Benign hypertensive kidney disease with chronic kidney disease stage I through stage IV, or unspecified   . COPD (chronic obstructive pulmonary disease)     Dr. [name]  . PAF (paroxysmal atrial fibrillation)     Stopped flecainide, on amiodarone but still has bouts of A FIB (mostly in mornings).   . Tachy-brady syndrome     s/p permanent pacemaker 06/27/1999 (Battery change 06/2007)  .  GERD (gastroesophageal reflux disease)   . Hypothyroidism     Past Surgical History  Procedure Laterality Date  . Partital nephrectomy Left 1976    stone disease  . Appendectomy    . Bunionectomy with hammertoe reconstruction Bilateral   . Total knee arthroplasty Right   . Incisional hernia repair    . Cataract extraction, bilateral    . Tonsillectomy and adenoidectomy    . Cardiac pacemaker placement  06/27/99    Medtronic PM implanted by Dr [name]  . Hernia repair    . Esophagogastroduodenoscopy (egd) with propofol N/A 04/22/2013    Procedure: ESOPHAGOGASTRODUODENOSCOPY (EGD) WITH PROPOFOL;  Surgeon: [name], MD;  Location: [facility];  Service: Endoscopy;  Laterality: N/A;  . Colonoscopy with propofol N/A 04/22/2013    Procedure: COLONOSCOPY WITH PROPOFOL;  Surgeon: [name], MD;  Location: [facility];  Service: Endoscopy;  Laterality: N/A;    Current Outpatient Prescriptions  Medication Sig Dispense Refill  . albuterol (PROVENTIL HFA;VENTOLIN HFA) 108 (90 BASE) MCG/ACT inhaler Inhale 2 puffs into the lungs every 6 (six) hours as needed for wheezing or shortness of breath.  1 Inhaler  prn  . cholecalciferol (VITAMIN D) 1000 UNITS tablet Take 2,000 Units by mouth daily.      [name] diltiazem (CARDIZEM CD) 240 MG 24 hr capsule Take 240 mg by mouth 2 (two) times daily.      [name] diltiazem (  CARDIZEM) 30 MG tablet Take 30 mg by mouth as needed (heart irregularity).       . fluticasone (FLONASE) 50 MCG/ACT nasal spray Place 2 sprays into the nose daily as needed for rhinitis.      . furosemide (LASIX) 40 MG tablet Take 40 mg by mouth 2 (two) times daily.      [name] levothyroxine (SYNTHROID, LEVOTHROID) 88 MCG tablet Take 88 mcg by mouth daily before breakfast.      . Lutein 20 MG CAPS Take 1 capsule by mouth daily.       . Magnesium 200 MG TABS Take 200 mg by mouth daily.      . metoprolol succinate (TOPROL-XL) 25 MG 24 hr tablet Take 25 mg by mouth every evening.       .  mometasone-formoterol (DULERA) 100-5 MCG/ACT AERO Inhale 2 puffs into the lungs 2 (two) times daily.  1 Inhaler  5  . montelukast (SINGULAIR) 10 MG tablet TAKE ONE TABLET AT BEDTIME.  90 tablet  1  . Multiple Vitamin (MULTIVITAMIN) tablet Take 1 tablet by mouth daily.       . Multiple Vitamins-Minerals (PRESERVISION AREDS PO) Take 2 tablets by mouth daily.       . propafenone (RYTHMOL SR) 425 MG 12 hr capsule Take 425 mg by mouth 2 (two) times daily.      . Rivaroxaban (XARELTO) 15 MG TABS tablet Take 1 tablet (15 mg total) by mouth daily with supper.  30 tablet  11   No current facility-administered medications for this visit.    Allergies:    Allergies  Allergen Reactions  . Penicillins Hives  . Brimonidine Tartrate-Timolol     REACTION: systemic malaise amigen eye drop    Social History:  The patient  reports that she has never smoked. She has never used smokeless tobacco. She reports that she drinks alcohol. She reports that she does not use illicit drugs.   Family History  Problem Relation Age of Onset  . Malignant hypertension Father   . Hypertension Father   . Renal Disease Father   . Breast cancer Mother   . Heart attack Brother   . Stroke Brother     ROS:  Please see the history of present illness.   No melena, no bleeding. Fatigue has improved dramatically. No shortness of breath, no chest pain.   All other systems reviewed and negative.   PHYSICAL EXAM: VS:  BP 123/76  Pulse 100  Ht 5\' 4"  (1.626 m)  Wt 134 lb (60.782 kg)  BMI 22.99 kg/m2 Well nourished, well developed, in no acute distress HEENT: normal, [place]/AT, EOMI Neck: no JVD, normal carotid upstroke, no bruit Cardiac:  normal S1, S2; irreg irreg; 1/6 S murmur Lungs:  clear to auscultation bilaterally, no wheezing, rhonchi or rales Abd: soft, nontender, no hepatomegaly, no bruits Ext: mild pedal edema, 2+ distal pulses Skin: warm and dry GU: deferred Neuro: no focal abnormalities noted, AAO x 3  EKG:   None today    Labs: 4/15-hemoglobin 12, back up to normal   ASSESSMENT AND PLAN:  1. Paroxysmal atrial fibrillation-currently on antiarrhythmic, diltiazem, metoprolol as above. She feels much better. Thyroid medication seems to be helping. Anemia improved. Colonoscopy, unexplained source. Dr. Wynetta Emery. She stopped Aleve. 2. Chronic anticoagulation-low-dose Xarelto given age. No bleeding currently. Dr. Felipa Eth monitoring blood work closely. 3. Hypothyroidism-recent addition of Synthroid. Dr. Felipa Eth. 4. Pacemaker-functioning well. 5. 6 months f/u. (Will be seeing Dr. [name] in 3 months.)  Signed,  Candee Furbish, MD [facility]  08/25/2013 11:23 AM

## 2013-08-25 NOTE — Patient Instructions (Addendum)
Your physician recommends that you continue on your current medications as directed. Please refer to the Current Medication list given to you today.  Your physician recommends that you schedule a follow-up appointment in: 3 months with Dr.Allred  Your physician wants you to follow-up in: 6 months with Dr.Skains You will receive a reminder letter in the mail two months in advance. If you don't receive a letter, please call our office to schedule the follow-up appointment.

## 2013-09-02 ENCOUNTER — Ambulatory Visit: Payer: Self-pay | Admitting: Obstetrics & Gynecology

## 2013-09-23 ENCOUNTER — Other Ambulatory Visit: Payer: Self-pay

## 2013-09-23 MED ORDER — DILTIAZEM HCL ER COATED BEADS 240 MG PO CP24: 240.0000 mg | ORAL_CAPSULE | Freq: Two times a day (BID) | ORAL | Status: DC

## 2013-09-23 MED ORDER — FUROSEMIDE 40 MG PO TABS: 40.0000 mg | ORAL_TABLET | Freq: Two times a day (BID) | ORAL | Status: DC

## 2013-09-23 MED ORDER — METOPROLOL SUCCINATE ER 25 MG PO TB24: 25.0000 mg | ORAL_TABLET | Freq: Every evening | ORAL | Status: DC

## 2013-09-23 MED ORDER — RIVAROXABAN 15 MG PO TABS: 15.0000 mg | ORAL_TABLET | Freq: Every day | ORAL | Status: DC

## 2013-09-24 ENCOUNTER — Other Ambulatory Visit: Payer: Self-pay

## 2013-09-24 MED ORDER — RIVAROXABAN 15 MG PO TABS
15.0000 mg | ORAL_TABLET | Freq: Every day | ORAL | Status: DC
Start: 2013-09-24 — End: 2013-09-28

## 2013-09-24 MED ORDER — DILTIAZEM HCL ER COATED BEADS 240 MG PO CP24: 240.0000 mg | ORAL_CAPSULE | Freq: Two times a day (BID) | ORAL | Status: DC

## 2013-09-24 MED ORDER — DILTIAZEM HCL 30 MG PO TABS: 30.0000 mg | ORAL_TABLET | ORAL | Status: DC | PRN

## 2013-09-24 MED ORDER — METOPROLOL SUCCINATE ER 25 MG PO TB24: 25.0000 mg | ORAL_TABLET | Freq: Every evening | ORAL | Status: DC

## 2013-09-28 ENCOUNTER — Other Ambulatory Visit: Payer: Self-pay

## 2013-09-28 MED ORDER — FUROSEMIDE 40 MG PO TABS: 40.0000 mg | ORAL_TABLET | Freq: Two times a day (BID) | ORAL | Status: DC

## 2013-09-28 MED ORDER — DILTIAZEM HCL ER COATED BEADS 240 MG PO CP24: 240.0000 mg | ORAL_CAPSULE | Freq: Two times a day (BID) | ORAL | Status: DC

## 2013-09-28 MED ORDER — METOPROLOL SUCCINATE ER 25 MG PO TB24: 25.0000 mg | ORAL_TABLET | Freq: Every evening | ORAL | Status: DC

## 2013-09-28 MED ORDER — RIVAROXABAN 15 MG PO TABS
15.0000 mg | ORAL_TABLET | Freq: Every day | ORAL | Status: DC
Start: 2013-09-28 — End: 2013-11-30

## 2013-10-18 ENCOUNTER — Ambulatory Visit (INDEPENDENT_AMBULATORY_CARE_PROVIDER_SITE_OTHER): Payer: Medicare Other | Admitting: Obstetrics & Gynecology

## 2013-10-18 ENCOUNTER — Encounter: Payer: Self-pay | Admitting: Obstetrics & Gynecology

## 2013-10-18 VITALS — BP 114/70 | HR 64 | Resp 16 | Ht 63.5 in | Wt 135.0 lb

## 2013-10-18 DIAGNOSIS — Z124 Encounter for screening for malignant neoplasm of cervix: Secondary | ICD-10-CM

## 2013-10-18 DIAGNOSIS — Z01419 Encounter for gynecological examination (general) (routine) without abnormal findings: Secondary | ICD-10-CM

## 2013-10-18 NOTE — Progress Notes (Signed)
80 y.o. G2P2 MarriedCaucasianF here for annual exam.  Stopped her estrogel last year.  Has done well since.  Not using the vagifem either.  Primary aphasia with her husband.  They are in [place].  Family decided that he should stop driving.  This has made them sad, him in particular.  Memory issues are worsening as well.  No vaginal bleeding.    Patient's last menstrual period was 04/08/1974.          Sexually active: No.  The current method of family planning is post menopausal status.    Exercising: Yes.    yoga and walking Smoker:  no  Health Maintenance: Pap:  12/30/08 WNL History of abnormal Pap:  no MMG:  12/14/09-normal Colonoscopy:  3/15-secondary anemia-normal BMD:   2010 TDaP:  Up to date with Dr Felipa Eth Screening Labs: PCP, Hb today: PCP, Urine today: PCP   reports that she has never smoked. She has never used smokeless tobacco. She reports that she drinks alcohol. She reports that she does not use illicit drugs.  Past Medical History  Diagnosis Date  . Obstructive chronic bronchitis with exacerbation     Dr. [name]  . Rhinitis   . Hypertension   . Diverticulosis   . Asthmatic bronchitis   . Pure hypercholesterolemia   . Macular degeneration     Dr. [name]  . Aortic valve disorders   . Unspecified constipation   . Osteoarthrosis, unspecified whether generalized or localized, other specified sites   . Low back pain   . Encounter for long-term (current) use of other medications   . Staghorn calculus     Left  . Benign hypertensive kidney disease with chronic kidney disease stage I through stage IV, or unspecified   . COPD (chronic obstructive pulmonary disease)     Dr. [name]  . PAF (paroxysmal atrial fibrillation)     Stopped flecainide, on amiodarone but still has bouts of A FIB (mostly in mornings).   . Tachy-brady syndrome     s/p permanent pacemaker 06/27/1999 (Battery change 06/2007)  . GERD (gastroesophageal reflux disease)   . Hypothyroidism     on  medication  . Atrial fibrillation   . Anemia     Past Surgical History  Procedure Laterality Date  . Partital nephrectomy Left 1976    stone disease  . Appendectomy    . Bunionectomy with hammertoe reconstruction Bilateral   . Total knee arthroplasty Right   . Incisional hernia repair    . Cataract extraction, bilateral    . Tonsillectomy and adenoidectomy    . Cardiac pacemaker placement  06/27/99    Medtronic PM implanted by Dr [name]  . Hernia repair    . Esophagogastroduodenoscopy (egd) with propofol N/A 04/22/2013    Procedure: ESOPHAGOGASTRODUODENOSCOPY (EGD) WITH PROPOFOL;  Surgeon: [name], MD;  Location: [facility];  Service: Endoscopy;  Laterality: N/A;  . Colonoscopy with propofol N/A 04/22/2013    Procedure: COLONOSCOPY WITH PROPOFOL;  Surgeon: [name], MD;  Location: [facility];  Service: Endoscopy;  Laterality: N/A;    Current Outpatient Prescriptions  Medication Sig Dispense Refill  . albuterol (PROVENTIL HFA;VENTOLIN HFA) 108 (90 BASE) MCG/ACT inhaler Inhale 2 puffs into the lungs every 6 (six) hours as needed for wheezing or shortness of breath.  1 Inhaler  prn  . cholecalciferol (VITAMIN D) 1000 UNITS tablet Take 2,000 Units by mouth daily.      [name] diltiazem (CARDIZEM CD) 240 MG 24 hr capsule Take 1 capsule (  240 mg total) by mouth 2 (two) times daily.  180 capsule  2  . diltiazem (CARDIZEM) 30 MG tablet Take 1 tablet (30 mg total) by mouth as needed (heart irregularity).  30 tablet  3  . fluticasone (FLONASE) 50 MCG/ACT nasal spray Place 2 sprays into the nose daily as needed for rhinitis.      . furosemide (LASIX) 40 MG tablet Take 1 tablet (40 mg total) by mouth 2 (two) times daily.  180 tablet  3  . levothyroxine (SYNTHROID, LEVOTHROID) 88 MCG tablet Take 88 mcg by mouth daily before breakfast.      . Lutein 20 MG CAPS Take 1 capsule by mouth daily.       . Magnesium 200 MG TABS Take 200 mg by mouth daily.      . metoprolol succinate  (TOPROL-XL) 25 MG 24 hr tablet Take 1 tablet (25 mg total) by mouth every evening.  90 tablet  3  . mometasone-formoterol (DULERA) 100-5 MCG/ACT AERO Inhale 2 puffs into the lungs 2 (two) times daily.  1 Inhaler  5  . montelukast (SINGULAIR) 10 MG tablet TAKE ONE TABLET AT BEDTIME.  90 tablet  1  . Multiple Vitamin (MULTIVITAMIN) tablet Take 1 tablet by mouth daily.       . Multiple Vitamins-Minerals (PRESERVISION AREDS PO) Take 2 tablets by mouth daily.       . propafenone (RYTHMOL SR) 425 MG 12 hr capsule Take 425 mg by mouth 2 (two) times daily.      . Rivaroxaban (XARELTO) 15 MG TABS tablet Take 1 tablet (15 mg total) by mouth daily with supper.  90 tablet  2   No current facility-administered medications for this visit.    Family History  Problem Relation Age of Onset  . Malignant hypertension Father   . Hypertension Father   . Renal Disease Father   . Breast cancer Mother   . Heart attack Brother   . Stroke Brother     ROS:  Pertinent items are noted in HPI.  Otherwise, a comprehensive ROS was negative.  Exam:   BP 114/70  Pulse 64  Resp 16  Ht 5' 3.5" (1.613 m)  Wt 135 lb (61.236 kg)  BMI 23.54 kg/m2  LMP 04/08/1974  Weight change: no change  Height: 5' 3.5" (161.3 cm)  Ht Readings from Last 3 Encounters:  10/18/13 5' 3.5" (1.613 m)  08/25/13 5\' 4"  (1.626 m)  07/01/13 5' 4.25" (1.632 m)    General appearance: alert, cooperative and appears stated age Head: Normocephalic, without obvious abnormality, atraumatic Neck: no adenopathy, supple, symmetrical, trachea midline and thyroid normal to inspection and palpation Lungs: clear to auscultation bilaterally Breasts: normal appearance, no masses or tenderness Heart: regular rate and rhythm Abdomen: soft, non-tender; bowel sounds normal; no masses,  no organomegaly Extremities: extremities normal, atraumatic, no cyanosis or edema Skin: Skin color, texture, turgor normal. No rashes or lesions Lymph nodes: Cervical,  supraclavicular, and axillary nodes normal. No abnormal inguinal nodes palpated Neurologic: Grossly normal   Pelvic: External genitalia:  no lesions              Urethra:  normal appearing urethra with no masses, tenderness or lesions              Bartholins and Skenes: normal                 Vagina: normal appearing vagina with normal color and discharge, no lesions  Cervix: no lesions              Pap taken: No. Bimanual Exam:  Uterus:  normal size, contour, position, consistency, mobility, non-tender              Adnexa: normal adnexa and no mass, fullness, tenderness               Rectovaginal: Confirms               Anus:  normal sphincter tone, no lesions  A:  Well Woman with normal exam PMP, no HRT H/O DVT in pregnancy, no HRT use A fib, with pacemaker Elevated cholesterol  P:   Mammogram due.  Pt aware.  Does not want me to make appt for her.  Not sure she would treat any diagnosis so not sure if she will do another one. pap smear not indicated Patient not sure if she wants to return again.  Advised could see every two years.  Will put her in recall for then.  An After Visit Summary was printed and given to the patient.

## 2013-11-02 ENCOUNTER — Other Ambulatory Visit: Payer: Self-pay | Admitting: Nurse Practitioner

## 2013-11-23 DIAGNOSIS — N183 Chronic kidney disease, stage 3 unspecified: Secondary | ICD-10-CM | POA: Diagnosis not present

## 2013-11-23 DIAGNOSIS — R0602 Shortness of breath: Secondary | ICD-10-CM | POA: Diagnosis not present

## 2013-11-23 DIAGNOSIS — I129 Hypertensive chronic kidney disease with stage 1 through stage 4 chronic kidney disease, or unspecified chronic kidney disease: Secondary | ICD-10-CM | POA: Diagnosis not present

## 2013-11-23 DIAGNOSIS — R609 Edema, unspecified: Secondary | ICD-10-CM | POA: Diagnosis not present

## 2013-11-23 DIAGNOSIS — Z79899 Other long term (current) drug therapy: Secondary | ICD-10-CM | POA: Diagnosis not present

## 2013-11-23 DIAGNOSIS — Z1331 Encounter for screening for depression: Secondary | ICD-10-CM | POA: Diagnosis not present

## 2013-11-23 DIAGNOSIS — E039 Hypothyroidism, unspecified: Secondary | ICD-10-CM | POA: Diagnosis not present

## 2013-11-23 DIAGNOSIS — Z23 Encounter for immunization: Secondary | ICD-10-CM | POA: Diagnosis not present

## 2013-11-23 DIAGNOSIS — Z0000 Encounter for general adult medical examination without abnormal findings: Secondary | ICD-10-CM | POA: Diagnosis not present

## 2013-11-25 ENCOUNTER — Encounter: Payer: Self-pay | Admitting: Internal Medicine

## 2013-11-25 ENCOUNTER — Ambulatory Visit (INDEPENDENT_AMBULATORY_CARE_PROVIDER_SITE_OTHER): Payer: Medicare Other | Admitting: Internal Medicine

## 2013-11-25 VITALS — BP 142/76 | HR 92 | Ht 64.0 in | Wt 138.8 lb

## 2013-11-25 DIAGNOSIS — I10 Essential (primary) hypertension: Secondary | ICD-10-CM

## 2013-11-25 DIAGNOSIS — Z950 Presence of cardiac pacemaker: Secondary | ICD-10-CM | POA: Diagnosis not present

## 2013-11-25 DIAGNOSIS — I495 Sick sinus syndrome: Secondary | ICD-10-CM

## 2013-11-25 DIAGNOSIS — Z7901 Long term (current) use of anticoagulants: Secondary | ICD-10-CM

## 2013-11-25 DIAGNOSIS — I4891 Unspecified atrial fibrillation: Secondary | ICD-10-CM

## 2013-11-25 LAB — MDC_IDC_ENUM_SESS_TYPE_INCLINIC
Battery Voltage: 2.93 V
Brady Statistic AP VS Percent: 35.37 %
Brady Statistic AS VS Percent: 58.59 %
Brady Statistic RA Percent Paced: 35.65 %
Date Time Interrogation Session: 20150820191913
Lead Channel Impedance Value: 504 Ohm
Lead Channel Pacing Threshold Amplitude: 1.5 V
Lead Channel Pacing Threshold Pulse Width: 0.6 ms
Lead Channel Sensing Intrinsic Amplitude: 2.5 mV
Lead Channel Setting Pacing Amplitude: 2 V
MDC IDC MSMT LEADCHNL RA IMPEDANCE VALUE: 496 Ohm
MDC IDC MSMT LEADCHNL RV SENSING INTR AMPL: 9.1 mV
MDC IDC SET LEADCHNL RA PACING AMPLITUDE: 2 V
MDC IDC SET LEADCHNL RV PACING PULSEWIDTH: 0.6 ms
MDC IDC SET LEADCHNL RV SENSING SENSITIVITY: 0.9 mV
MDC IDC SET ZONE DETECTION INTERVAL: 400 ms
MDC IDC SET ZONE DETECTION INTERVAL: 400 ms
MDC IDC STAT BRADY AP VP PERCENT: 0.28 %
MDC IDC STAT BRADY AS VP PERCENT: 5.76 %
MDC IDC STAT BRADY RV PERCENT PACED: 6.04 %
Zone Setting Detection Interval: 200 ms

## 2013-11-25 NOTE — Patient Instructions (Signed)
Your physician has recommended you make the following change in your medication:  1) STOP [place], PharmD, will be in touch regarding your admission to the hospital to begin Tikosyn on Tuesday, December 07, 2013.

## 2013-11-26 ENCOUNTER — Encounter: Payer: Self-pay | Admitting: Internal Medicine

## 2013-11-27 NOTE — Progress Notes (Signed)
PCP: Mathews Argyle, MD Primary Cardiologist: Cristy Folks Jody Blake is a 80 y.o. female who presents today for routine electrophysiology followup.  She continues to have episodic atrial arrhythmias.  Her afib is associated with decreased exercise tolerance and fatigue. She has BLE edema with increased diltiazem for rate control.  Today, she denies symptoms of chest pain, shortness of breath, dizziness, presyncope, or syncope.  The patient is otherwise without complaint today.   Past Medical History  Diagnosis Date  . Obstructive chronic bronchitis with exacerbation     Dr. [name]  . Rhinitis   . Hypertension   . Diverticulosis   . Asthmatic bronchitis   . Pure hypercholesterolemia   . Macular degeneration     Dr. [name]  . Aortic valve disorders   . Unspecified constipation   . Osteoarthrosis, unspecified whether generalized or localized, other specified sites   . Low back pain   . Encounter for long-term (current) use of other medications   . Staghorn calculus     Left  . Benign hypertensive kidney disease with chronic kidney disease stage I through stage IV, or unspecified   . COPD (chronic obstructive pulmonary disease)     Dr. [name]  . PAF (paroxysmal atrial fibrillation)     Stopped flecainide, on amiodarone but still has bouts of A FIB (mostly in mornings).   . Tachy-brady syndrome     s/p permanent pacemaker 06/27/1999 (Battery change 06/2007)  . GERD (gastroesophageal reflux disease)   . Hypothyroidism     on medication  . Atrial fibrillation   . Anemia   . DVT (deep vein thrombosis) in pregnancy    Past Surgical History  Procedure Laterality Date  . Partital nephrectomy Left 1976    stone disease  . Appendectomy    . Bunionectomy with hammertoe reconstruction Bilateral   . Total knee arthroplasty Right   . Incisional hernia repair    . Cataract extraction, bilateral    . Tonsillectomy and adenoidectomy    . Cardiac pacemaker placement  06/27/99    Medtronic  PM implanted by Dr [name]  . Hernia repair    . Esophagogastroduodenoscopy (egd) with propofol N/A 04/22/2013    Procedure: ESOPHAGOGASTRODUODENOSCOPY (EGD) WITH PROPOFOL;  Surgeon: [name], MD;  Location: [facility];  Service: Endoscopy;  Laterality: N/A;  . Colonoscopy with propofol N/A 04/22/2013    Procedure: COLONOSCOPY WITH PROPOFOL;  Surgeon: [name], MD;  Location: [facility];  Service: Endoscopy;  Laterality: N/A;    Current Outpatient Prescriptions  Medication Sig Dispense Refill  . albuterol (PROVENTIL HFA;VENTOLIN HFA) 108 (90 BASE) MCG/ACT inhaler Inhale 2 puffs into the lungs every 6 (six) hours as needed for wheezing or shortness of breath.  1 Inhaler  prn  . cholecalciferol (VITAMIN D) 1000 UNITS tablet Take 2,000 Units by mouth daily.      [name] diltiazem (CARDIZEM CD) 240 MG 24 hr capsule Take 1 capsule (240 mg total) by mouth 2 (two) times daily.  180 capsule  2  . diltiazem (CARDIZEM) 30 MG tablet Take 1 tablet (30 mg total) by mouth as needed (heart irregularity).  30 tablet  3  . fluticasone (FLONASE) 50 MCG/ACT nasal spray Place 2 sprays into the nose daily as needed for rhinitis.      . furosemide (LASIX) 40 MG tablet Take 1 tablet (40 mg total) by mouth 2 (two) times daily.  180 tablet  3  . levothyroxine (SYNTHROID, LEVOTHROID) 88 MCG tablet Take 88 mcg  by mouth daily before breakfast.      . Lutein 20 MG CAPS Take 1 capsule by mouth daily.       . Magnesium 200 MG TABS Take 200 mg by mouth daily.      . metoprolol succinate (TOPROL-XL) 25 MG 24 hr tablet Take 1 tablet (25 mg total) by mouth every evening.  90 tablet  3  . mometasone-formoterol (DULERA) 100-5 MCG/ACT AERO Inhale 2 puffs into the lungs 2 (two) times daily.  1 Inhaler  5  . montelukast (SINGULAIR) 10 MG tablet TAKE ONE TABLET AT BEDTIME.  90 tablet  1  . Multiple Vitamin (MULTIVITAMIN) tablet Take 1 tablet by mouth daily.       . Multiple Vitamins-Minerals (PRESERVISION AREDS PO) Take 2  tablets by mouth daily.       . Rivaroxaban (XARELTO) 15 MG TABS tablet Take 1 tablet (15 mg total) by mouth daily with supper.  90 tablet  2  . RYTHMOL SR 425 MG 12 hr capsule TAKE (1) CAPSULE TWICE DAILY.  60 capsule  3   No current facility-administered medications for this visit.    Physical Exam: Filed Vitals:   11/25/13 1507  BP: 142/76  Pulse: 92  Height: 5\' 4"  (1.626 m)  Weight: 138 lb 12.8 oz (62.959 kg)    GEN- The patient is well appearing, alert and oriented x 3 today.   Head- normocephalic, atraumatic Eyes-  Sclera clear, conjunctiva pink Ears- hearing intact Oropharynx- clear Lungs- Clear to ausculation bilaterally, normal work of breathing Chest- pacemaker pocket is well healed Heart- irregular rate and rhythm,   GI- soft, NT, ND, + BS Extremities- no clubbing, cyanosis, +2 edema  Pacemaker interrogation- reviewed in detail today,  See PACEART report ekg today reveals atrial flutter  Assessment and Plan:  1. Tachy/brady Normal pacemaker function See Pace Art report No changes today  2.  Atrial fibrillation/ atrial flutter Afib burden is much increased recently.  I was able to pace terminate her atrial flutter (CL 340 msec) with her pacemaker using noninvasive programmed stim (NIPS).  I have reprogrammed her atrial therapies to a more aggressive setting to hopefully allow ATP to work for her.  I think that she has pretty much failed flecainide at this time.  Given her advanced age, I think that we should try to avoid ablation.  I would therefore advise tikosyn.  Risks of this medicine were discussed with the patient who wishes to proceed.  I will stop flecainide and allow it to washout over the weekend.  She will return to the [facility] for initiation next week.  Her QTc is reasonable for initiation of tikosyn.  Continue xarelto She has substantial edema which is either due to diltiazem or her atrial arrhythmias Hopefully once on tikosyn she will better  maintain sinus rhythm and we will be able to wean diltiazem.  3.  HTN Stable No change required today  Return in 6 weeks Follow-up with Dr Marlou Porch as scheduled

## 2013-11-30 ENCOUNTER — Encounter: Payer: Self-pay | Admitting: Internal Medicine

## 2013-11-30 ENCOUNTER — Ambulatory Visit (INDEPENDENT_AMBULATORY_CARE_PROVIDER_SITE_OTHER): Payer: Medicare Other | Admitting: Pharmacist

## 2013-11-30 ENCOUNTER — Inpatient Hospital Stay (HOSPITAL_COMMUNITY)
Admission: AD | Admit: 2013-11-30 | Discharge: 2013-12-03 | DRG: 310 | Disposition: A | Discharge status: Home / Self Care | Payer: Medicare Other | Source: Ambulatory Visit | Attending: Internal Medicine | Admitting: Internal Medicine

## 2013-11-30 DIAGNOSIS — K219 Gastro-esophageal reflux disease without esophagitis: Secondary | ICD-10-CM | POA: Diagnosis present

## 2013-11-30 DIAGNOSIS — E876 Hypokalemia: Secondary | ICD-10-CM | POA: Diagnosis present

## 2013-11-30 DIAGNOSIS — J4489 Other specified chronic obstructive pulmonary disease: Secondary | ICD-10-CM | POA: Diagnosis present

## 2013-11-30 DIAGNOSIS — I509 Heart failure, unspecified: Secondary | ICD-10-CM | POA: Diagnosis present

## 2013-11-30 DIAGNOSIS — N181 Chronic kidney disease, stage 1: Secondary | ICD-10-CM | POA: Diagnosis present

## 2013-11-30 DIAGNOSIS — I5032 Chronic diastolic (congestive) heart failure: Secondary | ICD-10-CM

## 2013-11-30 DIAGNOSIS — I4891 Unspecified atrial fibrillation: Secondary | ICD-10-CM | POA: Diagnosis present

## 2013-11-30 DIAGNOSIS — Z7901 Long term (current) use of anticoagulants: Secondary | ICD-10-CM

## 2013-11-30 DIAGNOSIS — E7800 Pure hypercholesterolemia, unspecified: Secondary | ICD-10-CM | POA: Diagnosis present

## 2013-11-30 DIAGNOSIS — E039 Hypothyroidism, unspecified: Secondary | ICD-10-CM | POA: Diagnosis present

## 2013-11-30 DIAGNOSIS — I129 Hypertensive chronic kidney disease with stage 1 through stage 4 chronic kidney disease, or unspecified chronic kidney disease: Secondary | ICD-10-CM | POA: Diagnosis present

## 2013-11-30 DIAGNOSIS — J449 Chronic obstructive pulmonary disease, unspecified: Secondary | ICD-10-CM | POA: Diagnosis present

## 2013-11-30 DIAGNOSIS — I359 Nonrheumatic aortic valve disorder, unspecified: Secondary | ICD-10-CM | POA: Diagnosis present

## 2013-11-30 DIAGNOSIS — I495 Sick sinus syndrome: Secondary | ICD-10-CM

## 2013-11-30 DIAGNOSIS — Z79899 Other long term (current) drug therapy: Secondary | ICD-10-CM

## 2013-11-30 DIAGNOSIS — N183 Chronic kidney disease, stage 3 unspecified: Secondary | ICD-10-CM

## 2013-11-30 DIAGNOSIS — I10 Essential (primary) hypertension: Secondary | ICD-10-CM | POA: Diagnosis not present

## 2013-11-30 DIAGNOSIS — E785 Hyperlipidemia, unspecified: Secondary | ICD-10-CM | POA: Diagnosis present

## 2013-11-30 DIAGNOSIS — I4819 Other persistent atrial fibrillation: Secondary | ICD-10-CM

## 2013-11-30 DIAGNOSIS — I4892 Unspecified atrial flutter: Secondary | ICD-10-CM | POA: Diagnosis present

## 2013-11-30 DIAGNOSIS — I4821 Permanent atrial fibrillation: Secondary | ICD-10-CM | POA: Diagnosis present

## 2013-11-30 DIAGNOSIS — I480 Paroxysmal atrial fibrillation: Secondary | ICD-10-CM

## 2013-11-30 DIAGNOSIS — Z950 Presence of cardiac pacemaker: Secondary | ICD-10-CM

## 2013-11-30 HISTORY — DX: Nonrheumatic aortic (valve) stenosis: I35.0

## 2013-11-30 HISTORY — DX: Reserved for concepts with insufficient information to code with codable children: IMO0002

## 2013-11-30 HISTORY — DX: Other ill-defined heart diseases: I51.89

## 2013-11-30 LAB — BASIC METABOLIC PANEL
ANION GAP: 11 (ref 5–15)
BUN / CREAT RATIO: 32 — AB (ref 11–26)
BUN: 37 mg/dL — ABNORMAL HIGH (ref 8–27)
BUN: 41 mg/dL — ABNORMAL HIGH (ref 6–23)
CHLORIDE: 95 meq/L — AB (ref 96–112)
CO2: 34 mmol/L — ABNORMAL HIGH (ref 18–29)
CO2: 30 mEq/L (ref 19–32)
CREATININE: 1.16 mg/dL — AB (ref 0.57–1.00)
Calcium: 10 mg/dL (ref 8.7–10.3)
Calcium: 9.7 mg/dL (ref 8.4–10.5)
Chloride: 96 mmol/L (ref 96–108)
Creatinine, Ser: 1.26 mg/dL — ABNORMAL HIGH (ref 0.50–1.10)
GFR calc Af Amer: 50 mL/min/{1.73_m2} — ABNORMAL LOW (ref >59)
GFR calc Af Amer: 44 mL/min — ABNORMAL LOW (ref >90)
GFR, EST NON AFRICAN AMERICAN: 43 mL/min/{1.73_m2} — AB (ref >59)
GFR, EST NON AFRICAN AMERICAN: 38 mL/min — AB (ref >90)
Glucose, Bld: 173 mg/dL — ABNORMAL HIGH (ref 70–99)
Glucose: 89 mg/dL (ref 65–99)
POTASSIUM: 3.7 mmol/L (ref 3.5–5.2)
Potassium: 3.6 mEq/L — ABNORMAL LOW (ref 3.7–5.3)
SODIUM: 139 mmol/L (ref 134–144)
SODIUM: 136 meq/L — AB (ref 137–147)

## 2013-11-30 LAB — MAGNESIUM: Magnesium: 2.3 mg/dL (ref 1.6–2.6)

## 2013-11-30 MED ORDER — SODIUM CHLORIDE 0.9 % IJ SOLN: 3.0000 mL | INTRAMUSCULAR | Status: DC | PRN

## 2013-11-30 MED ORDER — POTASSIUM CHLORIDE 10 MEQ/100ML IV SOLN
10.0000 meq | INTRAVENOUS | Status: AC
  Administered 2013-11-30 – 2013-12-01 (×4): 10 meq via INTRAVENOUS
  Filled 2013-11-30 (×4): qty 100

## 2013-11-30 MED ORDER — ALBUTEROL SULFATE (2.5 MG/3ML) 0.083% IN NEBU: 3.0000 mL | INHALATION_SOLUTION | Freq: Four times a day (QID) | RESPIRATORY_TRACT | Status: DC | PRN

## 2013-11-30 MED ORDER — RIVAROXABAN 15 MG PO TABS
15.0000 mg | ORAL_TABLET | Freq: Every day | ORAL | Status: DC
  Administered 2013-11-30 – 2013-12-02 (×3): 15 mg via ORAL
  Filled 2013-11-30 (×4): qty 1

## 2013-11-30 MED ORDER — DOFETILIDE 250 MCG PO CAPS
250.0000 ug | ORAL_CAPSULE | Freq: Two times a day (BID) | ORAL | Status: DC
  Filled 2013-11-30 (×4): qty 1

## 2013-11-30 MED ORDER — SODIUM CHLORIDE 0.9 % IJ SOLN
3.0000 mL | Freq: Two times a day (BID) | INTRAMUSCULAR | Status: DC
  Administered 2013-12-01 – 2013-12-03 (×3): 3 mL via INTRAVENOUS

## 2013-11-30 MED ORDER — VITAMIN D3 25 MCG (1000 UNIT) PO TABS
2000.0000 [IU] | ORAL_TABLET | Freq: Every day | ORAL | Status: DC
  Administered 2013-11-30 – 2013-12-03 (×4): 2000 [IU] via ORAL
  Filled 2013-11-30 (×4): qty 2

## 2013-11-30 MED ORDER — MONTELUKAST SODIUM 10 MG PO TABS
10.0000 mg | ORAL_TABLET | Freq: Every day | ORAL | Status: DC
  Administered 2013-11-30 – 2013-12-02 (×3): 10 mg via ORAL
  Filled 2013-11-30 (×4): qty 1

## 2013-11-30 MED ORDER — METOPROLOL SUCCINATE ER 25 MG PO TB24
25.0000 mg | ORAL_TABLET | Freq: Every evening | ORAL | Status: DC
  Administered 2013-11-30: 25 mg via ORAL
  Filled 2013-11-30 (×2): qty 1

## 2013-11-30 MED ORDER — MOMETASONE FURO-FORMOTEROL FUM 100-5 MCG/ACT IN AERO
2.0000 | INHALATION_SPRAY | Freq: Two times a day (BID) | RESPIRATORY_TRACT | Status: DC
  Administered 2013-11-30 – 2013-12-03 (×5): 2 via RESPIRATORY_TRACT
  Filled 2013-11-30: qty 8.8

## 2013-11-30 MED ORDER — LEVOTHYROXINE SODIUM 88 MCG PO TABS
88.0000 ug | ORAL_TABLET | Freq: Every day | ORAL | Status: DC
  Administered 2013-12-01 – 2013-12-03 (×3): 88 ug via ORAL
  Filled 2013-11-30 (×5): qty 1

## 2013-11-30 MED ORDER — DILTIAZEM HCL ER COATED BEADS 240 MG PO CP24
240.0000 mg | ORAL_CAPSULE | Freq: Two times a day (BID) | ORAL | Status: DC
  Administered 2013-11-30 – 2013-12-03 (×6): 240 mg via ORAL
  Filled 2013-11-30 (×9): qty 1

## 2013-11-30 MED ORDER — DOFETILIDE 250 MCG PO CAPS: 250.0000 ug | ORAL_CAPSULE | Freq: Two times a day (BID) | ORAL | Status: DC

## 2013-11-30 MED ORDER — SODIUM CHLORIDE 0.9 % IV SOLN
250.0000 mL | INTRAVENOUS | Status: DC | PRN
  Administered 2013-12-02: 12:00:00 via INTRAVENOUS

## 2013-11-30 NOTE — Progress Notes (Signed)
Potassium arrived to unit at 2150.  Administered first dose and changed times on other 3 doses due to later med arrival time.  Informing oncoming RN to have potassium level checked following four runs.

## 2013-11-30 NOTE — Assessment & Plan Note (Signed)
Reviewed pt's labs.  K- 3.7, Mg- 2.3, SCr- 1.13.  Discussed options with pt given K <4.0.  She states she feels so bad that she would prefer to go to the hospital today and recheck labs prior to starting Tikosyn.  She had 15mEq tablets of potassium at home.  She was instructed to take 52mEq prior to going to the hospital so it will hopefully be >4.0 on recheck.  EKG reviewed by Dr. Lovena Le. QTc <440 msec.  CrCl- 35 mL/min.  Will need to start at 159mcg BID due to decreased renal function.  Pt aware to report to hospital.

## 2013-11-30 NOTE — Progress Notes (Signed)
Pharmacy Consult for Dofetilide (Tikosyn) Initiation  Admit Complaint: 80 y.o. female admitted 11/30/2013 with atrial fibrillation to be initiated on dofetilide.   Assessment:  Patient Exclusion Criteria: If any screening criteria checked as "Yes", then  patient  should NOT receive dofetilide until criteria item is corrected. If "Yes" please indicate correction plan.  YES  NO Patient  Exclusion Criteria Correction Plan  []  [x]  Baseline QTc interval is greater than or equal to 440 msec. IF above YES box checked dofetilide contraindicated unless patient has ICD; then may proceed if QTc 500-550 msec or with known ventricular conduction abnormalities may proceed with QTc 550-600 msec. QTc =     []  [x]  Magnesium level is less than 1.8 mEq/l : Last magnesium:  Lab Results  Component Value Date   MG 2.3 11/30/2013         [x]  []  Potassium level is less than 4 mEq/l : Last potassium:  Lab Results  Component Value Date   K 3.6* 11/30/2013       4 runs of k ordered (also got Kdur in office earlier today)  []  [x]  Patient is known or suspected to have a digoxin level greater than 2 ng/ml: No results found for this basename: DIGOXIN      []  [x]  Creatinine clearance less than 20 ml/min (calculated using Cockcroft-Gault, actual body weight and serum creatinine): The CrCl is unknown because both a height and weight (above a minimum accepted value) are required for this calculation.    []  [x]  Patient has received drugs known to prolong the QT intervals within the last 48 hours(phenothiazines, tricyclics or tetracyclic antidepressants, erythromycin, H-1 antihistamines, cisapride, fluoroquinolones, azithromycin). Drugs not listed above may have an, as yet, undetected potential to prolong the QT interval, updated information on QT prolonging agents is available at this website:QT prolonging agents   []  [x]  Patient received a dose of hydrochlorothiazide (Oretic) alone or in any combination including  triamterene (Dyazide, Maxzide) in the last 48 hours.   []  [x]  Patient received a medication known to increase dofetilide plasma concentrations prior to initial dofetilide dose:    Trimethoprim (Primsol, Proloprim) in the last 36 hours   Verapamil (Calan, Verelan) in the last 36 hours or a sustained release dose in the last 72 hours   Megestrol (Megace) in the last 5 days    Cimetidine (Tagamet) in the last 6 hours   Ketoconazole (Nizoral) in the last 24 hours   Itraconazole (Sporanox) in the last 48 hours    Prochlorperazine (Compazine) in the last 36 hours    []  [x]  Patient is known to have a history of torsades de pointes; congenital or acquired long QT syndromes.   []  [x]  Patient has received a Class 1 antiarrhythmic with less than 2 half-lives since last dose. (Disopyramide, Quinidine, Procainamide, Lidocaine, Mexiletine, Flecainide, Propafenone)   []  [x]  Patient has received amiodarone therapy in the past 3 months or amiodarone level is greater than 0.3 ng/ml.    Patient has been appropriately anticoagulated with xarelto.  Ordering provider was confirmed at [email or web address] if they are not listed on the [place] Prescribers list.  Goal of Therapy:  Follow renal function, electrolytes, potential drug interactions, and dose adjustment. Provide education and 1 week supply at discharge.  Plan:  Tikosyn not yet started as K being repleted.   1.  Initiate dofetilide based on renal function:  [facility] Calculated CrCl  Dose q12h  []  > 60 ml/min 500 mcg  []  40-60 ml/min 250  mcg  []  20-40 ml/min 125 mcg   Dr. [name] wants to start with 250 mcg po q12h and monitor. Calculated creatinine clearance with creat 1.26 ~ 33 ml/min.  2. Follow up QTc after the first 5 doses, renal function, electrolytes (K & Mg) daily x 3     days, dose adjustment, success of initiation and facilitate 1 week discharge supply as     clinically indicated.  3. Initiate Tikosyn education video (Call  [phone] and ask for video # [phone]).  4. Place Enrollment Form on the chart for discharge supply of dofetilide.   Leodis Sias T 9:19 PM 11/30/2013

## 2013-11-30 NOTE — Progress Notes (Signed)
PCP: Mathews Argyle, MD  Primary Cardiologist: Cristy Folks Jody Blake is a 80 y.o. female who presents today for initiation of tikosyn. She continues to have episodic atrial arrhythmias. Her afib is associated with decreased exercise tolerance and fatigue. She has BLE edema with increased diltiazem for rate control. Today, she denies symptoms of chest pain, shortness of breath, dizziness, presyncope, or syncope. The patient is otherwise without complaint today.   Past Medical History   Diagnosis  Date   .  Obstructive chronic bronchitis with exacerbation      Dr. [name]   .  Rhinitis    .  Hypertension    .  Diverticulosis    .  Asthmatic bronchitis    .  Pure hypercholesterolemia    .  Macular degeneration      Dr. [name]   .  Aortic valve disorders    .  Unspecified constipation    .  Osteoarthrosis, unspecified whether generalized or localized, other specified sites    .  Low back pain    .  Encounter for long-term (current) use of other medications    .  Staghorn calculus      Left   .  Benign hypertensive kidney disease with chronic kidney disease stage I through stage IV, or unspecified    .  COPD (chronic obstructive pulmonary disease)      Dr. [name]   .  PAF (paroxysmal atrial fibrillation)      Stopped flecainide, on amiodarone but still has bouts of A FIB (mostly in mornings).   .  Tachy-brady syndrome      s/p permanent pacemaker 06/27/1999 (Battery change 06/2007)   .  GERD (gastroesophageal reflux disease)    .  Hypothyroidism      on medication   .  Atrial fibrillation    .  Anemia    .  DVT (deep vein thrombosis) in pregnancy     Past Surgical History   Procedure  Laterality  Date   .  Partital nephrectomy  Left  1976     stone disease   .  Appendectomy     .  Bunionectomy with hammertoe reconstruction  Bilateral    .  Total knee arthroplasty  Right    .  Incisional hernia repair     .  Cataract extraction, bilateral     .  Tonsillectomy and  adenoidectomy     .  Cardiac pacemaker placement   06/27/99     Medtronic PM implanted by Dr [name]   .  Hernia repair     .  Esophagogastroduodenoscopy (egd) with propofol  N/A  04/22/2013     Procedure: ESOPHAGOGASTRODUODENOSCOPY (EGD) WITH PROPOFOL; Surgeon: [name], MD; Location: [facility]; Service: Endoscopy; Laterality: N/A;   .  Colonoscopy with propofol  N/A  04/22/2013     Procedure: COLONOSCOPY WITH PROPOFOL; Surgeon: [name], MD; Location: [facility]; Service: Endoscopy; Laterality: N/A;    Current Outpatient Prescriptions   Medication  Sig  Dispense  Refill   .  albuterol (PROVENTIL HFA;VENTOLIN HFA) 108 (90 BASE) MCG/ACT inhaler  Inhale 2 puffs into the lungs every 6 (six) hours as needed for wheezing or shortness of breath.  1 Inhaler  prn   .  cholecalciferol (VITAMIN D) 1000 UNITS tablet  Take 2,000 Units by mouth daily.     [name]  diltiazem (CARDIZEM CD) 240 MG 24 hr capsule  Take 1 capsule (240 mg total) by  mouth 2 (two) times daily.  180 capsule  2   .  diltiazem (CARDIZEM) 30 MG tablet  Take 1 tablet (30 mg total) by mouth as needed (heart irregularity).  30 tablet  3   .  fluticasone (FLONASE) 50 MCG/ACT nasal spray  Place 2 sprays into the nose daily as needed for rhinitis.     .  furosemide (LASIX) 40 MG tablet  Take 1 tablet (40 mg total) by mouth 2 (two) times daily.  180 tablet  3   .  levothyroxine (SYNTHROID, LEVOTHROID) 88 MCG tablet  Take 88 mcg by mouth daily before breakfast.     .  Lutein 20 MG CAPS  Take 1 capsule by mouth daily.     .  Magnesium 200 MG TABS  Take 200 mg by mouth daily.     .  metoprolol succinate (TOPROL-XL) 25 MG 24 hr tablet  Take 1 tablet (25 mg total) by mouth every evening.  90 tablet  3   .  mometasone-formoterol (DULERA) 100-5 MCG/ACT AERO  Inhale 2 puffs into the lungs 2 (two) times daily.  1 Inhaler  5   .  montelukast (SINGULAIR) 10 MG tablet  TAKE ONE TABLET AT BEDTIME.  90 tablet  1   .  Multiple Vitamin  (MULTIVITAMIN) tablet  Take 1 tablet by mouth daily.     .  Multiple Vitamins-Minerals (PRESERVISION AREDS PO)  Take 2 tablets by mouth daily.     .  Rivaroxaban (XARELTO) 15 MG TABS tablet  Take 1 tablet (15 mg total) by mouth daily with supper.  90 tablet  2   .  RYTHMOL SR 425 MG 12 hr capsule  TAKE (1) CAPSULE TWICE DAILY.  60 capsule  3    No current facility-administered medications for this visit.    Physical Exam:  Filed Vitals:    11/25/13 1507   BP:  142/76   Pulse:  92   Height:  5\' 4"  (1.626 m)   Weight:  138 lb 12.8 oz (62.959 kg)    GEN- The patient is well appearing, alert and oriented x 3 today.  Head- normocephalic, atraumatic  Eyes- Sclera clear, conjunctiva pink  Ears- hearing intact  Oropharynx- clear  Lungs- Clear to ausculation bilaterally, normal work of breathing  Chest- pacemaker pocket is well healed  Heart- irregular rate and rhythm,  GI- soft, NT, ND, + BS  Extremities- no clubbing, cyanosis, +2 edema  Pacemaker interrogation- reviewed in detail today, See PACEART report    ekg and labs are reviewed  Assessment and Plan:  1.  Atrial fibrillation/ atrial flutter  Admit for initiation of tikosyn Qt today is 435 msec.  2. HTN  Stable   3. Anticoagulation chads2vasc score is at least 4.  Continue xarelto  4. Hypokalemia replete

## 2013-11-30 NOTE — Progress Notes (Signed)
HPI  Jody Blake is a 80 y.o. female who presents today for Tikosyn initiation.  She was seen by Dr. [name] on 11/27/13.  At that time, she reported continued  episodic atrial arrhythmias that were associated with decreased exercise tolerance and fatigue. She was on Rhythmol and this was discontinued on 8/22 in anticipation of switching to Tikosyn.   Reviewed pt's medication list.  She is currently not taking any contraindicated or QTc prolongating medications.  Previous antiarrhythmics include flecainide, amiodarone (stopped Oct 2014), and Rythmol (stopped 11/27/13).  All were stopped due to lack of efficacy.  She is anticoagulated with Xarelto 15mg  daily and reports compliance over the past 30 days.   Potential side effects of Tikosyn, including QTc prolongation, were reviewed with patient.  She is aware to call the office if she misses more than 2 doses in a row.   EKG reviewed by Dr. Lovena Le.  Afib with RVR.  Vent rate of 107 bpm.  QTc- 435 msec.   Current Outpatient Prescriptions  Medication Sig Dispense Refill  . albuterol (PROVENTIL HFA;VENTOLIN HFA) 108 (90 BASE) MCG/ACT inhaler Inhale 2 puffs into the lungs every 6 (six) hours as needed for wheezing or shortness of breath.  1 Inhaler  prn  . cholecalciferol (VITAMIN D) 1000 UNITS tablet Take 2,000 Units by mouth daily.      [name] diltiazem (CARDIZEM CD) 240 MG 24 hr capsule Take 1 capsule (240 mg total) by mouth 2 (two) times daily.  180 capsule  2  . diltiazem (CARDIZEM) 30 MG tablet Take 1 tablet (30 mg total) by mouth as needed (heart irregularity).  30 tablet  3  . fluticasone (FLONASE) 50 MCG/ACT nasal spray Place 2 sprays into the nose daily as needed for rhinitis.      . furosemide (LASIX) 40 MG tablet Take 1 tablet (40 mg total) by mouth 2 (two) times daily.  180 tablet  3  . levothyroxine (SYNTHROID, LEVOTHROID) 88 MCG tablet Take 88 mcg by mouth daily before breakfast.      . Lutein 20 MG CAPS Take 1 capsule by mouth daily.       .  Magnesium 200 MG TABS Take 200 mg by mouth daily.      . metoprolol succinate (TOPROL-XL) 25 MG 24 hr tablet Take 1 tablet (25 mg total) by mouth every evening.  90 tablet  3  . mometasone-formoterol (DULERA) 100-5 MCG/ACT AERO Inhale 2 puffs into the lungs 2 (two) times daily.  1 Inhaler  5  . montelukast (SINGULAIR) 10 MG tablet TAKE ONE TABLET AT BEDTIME.  90 tablet  1  . Multiple Vitamin (MULTIVITAMIN) tablet Take 1 tablet by mouth daily.       . Multiple Vitamins-Minerals (PRESERVISION AREDS PO) Take 2 tablets by mouth daily.       . Rivaroxaban (XARELTO) 15 MG TABS tablet Take 1 tablet (15 mg total) by mouth daily with supper.  90 tablet  2   No current facility-administered medications for this visit.

## 2013-12-01 ENCOUNTER — Encounter (HOSPITAL_COMMUNITY): Payer: Self-pay | Admitting: General Practice

## 2013-12-01 DIAGNOSIS — N183 Chronic kidney disease, stage 3 unspecified: Secondary | ICD-10-CM

## 2013-12-01 DIAGNOSIS — I5032 Chronic diastolic (congestive) heart failure: Secondary | ICD-10-CM

## 2013-12-01 DIAGNOSIS — I495 Sick sinus syndrome: Secondary | ICD-10-CM

## 2013-12-01 LAB — BASIC METABOLIC PANEL
ANION GAP: 10 (ref 5–15)
BUN: 30 mg/dL — ABNORMAL HIGH (ref 6–23)
CO2: 30 meq/L (ref 19–32)
CREATININE: 0.94 mg/dL (ref 0.50–1.10)
Calcium: 9.4 mg/dL (ref 8.4–10.5)
Chloride: 101 mEq/L (ref 96–112)
GFR calc Af Amer: 62 mL/min — ABNORMAL LOW (ref >90)
GFR calc non Af Amer: 54 mL/min — ABNORMAL LOW (ref >90)
GLUCOSE: 92 mg/dL (ref 70–99)
Potassium: 4 mEq/L (ref 3.7–5.3)
Sodium: 141 mEq/L (ref 137–147)

## 2013-12-01 LAB — MAGNESIUM: Magnesium: 2.2 mg/dL (ref 1.5–2.5)

## 2013-12-01 MED ORDER — DOFETILIDE 250 MCG PO CAPS
250.0000 ug | ORAL_CAPSULE | Freq: Two times a day (BID) | ORAL | Status: DC
  Administered 2013-12-01 – 2013-12-03 (×5): 250 ug via ORAL
  Filled 2013-12-01 (×8): qty 1

## 2013-12-01 MED ORDER — SODIUM CHLORIDE 0.9 % IV SOLN
INTRAVENOUS | Status: AC
  Administered 2013-12-01: 08:00:00 via INTRAVENOUS

## 2013-12-01 MED ORDER — METOPROLOL SUCCINATE ER 50 MG PO TB24
50.0000 mg | ORAL_TABLET | Freq: Every evening | ORAL | Status: DC
  Administered 2013-12-01 – 2013-12-02 (×2): 50 mg via ORAL
  Filled 2013-12-01 (×3): qty 1

## 2013-12-01 NOTE — Progress Notes (Signed)
Patient ambulated 500 ft with no assistance.  Jody Blake E

## 2013-12-01 NOTE — Progress Notes (Signed)
   SUBJECTIVE: The patient is doing well today.  At this time, she denies chest pain, shortness of breath, or any new concerns.  . cholecalciferol  2,000 Units Oral Daily  . diltiazem  240 mg Oral BID  . dofetilide  250 mcg Oral BID  . levothyroxine  88 mcg Oral QAC breakfast  . metoprolol succinate  50 mg Oral QPM  . mometasone-formoterol  2 puff Inhalation BID  . montelukast  10 mg Oral QHS  . Rivaroxaban  15 mg Oral Q supper  . sodium chloride  3 mL Intravenous Q12H   . sodium chloride      OBJECTIVE: Physical Exam: Filed Vitals:   11/30/13 1646 11/30/13 2043 11/30/13 2052 12/01/13 0349  BP: 111/71 111/67  112/81  Pulse: 73 94  98  Temp: 97.8 F (36.6 C) 98.2 F (36.8 C)  98.3 F (36.8 C)  TempSrc: Oral Oral  Oral  Resp: 20 20  20   SpO2: 98% 98% 98% 98%    Intake/Output Summary (Last 24 hours) at 12/01/13 6808 Last data filed at 11/30/13 1900  Gross per 24 hour  Intake    240 ml  Output      0 ml  Net    240 ml    Telemetry reveals afib with elevated V rates  GEN- The patient is well appearing, alert and oriented x 3 today.   Head- normocephalic, atraumatic Eyes-  Sclera clear, conjunctiva pink Ears- hearing intact Oropharynx- clear Neck- supple,  Lungs- Clear to ausculation bilaterally, normal work of breathing Heart- irregular rate and rhythm, no murmurs, rubs or gallops, PMI not laterally displaced GI- soft, NT, ND, + BS Extremities- no clubbing, cyanosis, + edema Skin- no rash or lesion Psych- euthymic mood, full affect Neuro- strength and sensation are intact  LABS: Basic Metabolic Panel:  Recent Labs  11/30/13 1031 11/30/13 1646 12/01/13 0534  NA 139 136* 141  K 3.7 3.6* 4.0  CL 96 95* 101  CO2 34* 30 30  GLUCOSE 89 173* 92  BUN 37* 41* 30*  CREATININE 1.16* 1.26* 0.94  CALCIUM 10.0 9.7 9.4  MG 2.3  --  2.2    ASSESSMENT AND PLAN:  Active Problems:   ATRIAL FIBRILLATION, PAROXYSMAL  1. afib Now that K is corrected, will start  tikosyn 250 mcg bid today Her crcl is 43.  She is dry and CrCl continues to improve with holding lasix.  Will give 500 ml of NS today If she remains in afib tomorrow then cardioversion will be required Increase metoprolol for elevated V rates Continue xarelto  2. htn Stable No change required today  3. Chronic diastolic dysfunction Due to afib most likely.  Edema is worsened by diltiazem Lasix is on hold  4. Chronic renal insufficiency As above  Thompson Grayer, MD 12/01/2013 8:12 AM

## 2013-12-01 NOTE — Care Management Note (Signed)
    Page 1 of 1   12/03/2013     1:30:17 PM CARE MANAGEMENT NOTE 12/03/2013  Patient:  Jody Blake, Jody Blake   Account Number:  [id number]  Date Initiated:  12/01/2013  Documentation initiated by:  Alondra Sahni  Subjective/Objective Assessment:   Pt adm on 11/30/13 with Afib with RVR for Tikosyn loading. PTA, pt independent of ADLS.     Action/Plan:   Will follow for dc needs/ make sure pt has Tikosyn available as OP.   Anticipated DC Date:  12/04/2013   Anticipated DC Plan:  [place]  CM consult  Medication Assistance      Choice offered to / List presented to:             Status of service:  Completed, signed off Medicare Important Message given?  YES (If response is "NO", the following Medicare IM given date fields will be blank) Date Medicare IM given:  12/03/2013 Medicare IM given by:  Rhylee Nunn Date Additional Medicare IM given:   Additional Medicare IM given by:    Discharge Disposition:  HOME/SELF CARE  Per UR Regulation:  Reviewed for med. necessity/level of care/duration of stay  If discussed at [place] of Stay Meetings, dates discussed:    Comments:  12/03/13 Ellan Lambert, RN, BSN [phone] Cardioversion unsuccessful yesterday.  Tikosyn discontinued today.  Pt for dc today, independent prior to admission. No needs identified for home.

## 2013-12-02 ENCOUNTER — Encounter (HOSPITAL_COMMUNITY)
Admission: AD | Disposition: A | Discharge status: Home / Self Care | Payer: Self-pay | Source: Ambulatory Visit | Attending: Internal Medicine

## 2013-12-02 ENCOUNTER — Inpatient Hospital Stay (HOSPITAL_COMMUNITY): Payer: Medicare Other | Admitting: Anesthesiology

## 2013-12-02 ENCOUNTER — Encounter (HOSPITAL_COMMUNITY): Payer: Self-pay | Admitting: *Deleted

## 2013-12-02 ENCOUNTER — Encounter (HOSPITAL_COMMUNITY): Payer: Medicare Other | Admitting: Anesthesiology

## 2013-12-02 DIAGNOSIS — I4891 Unspecified atrial fibrillation: Principal | ICD-10-CM

## 2013-12-02 HISTORY — PX: CARDIOVERSION: SHX1299

## 2013-12-02 LAB — BASIC METABOLIC PANEL
Anion gap: 11 (ref 5–15)
BUN: 19 mg/dL (ref 6–23)
CO2: 26 mEq/L (ref 19–32)
Calcium: 8.9 mg/dL (ref 8.4–10.5)
Chloride: 107 mEq/L (ref 96–112)
Creatinine, Ser: 0.74 mg/dL (ref 0.50–1.10)
GFR, EST AFRICAN AMERICAN: 87 mL/min — AB (ref >90)
GFR, EST NON AFRICAN AMERICAN: 75 mL/min — AB (ref >90)
Glucose, Bld: 90 mg/dL (ref 70–99)
Potassium: 3.8 mEq/L (ref 3.7–5.3)
SODIUM: 144 meq/L (ref 137–147)

## 2013-12-02 LAB — MAGNESIUM: Magnesium: 2 mg/dL (ref 1.5–2.5)

## 2013-12-02 SURGERY — CARDIOVERSION
Anesthesia: General

## 2013-12-02 MED ORDER — SODIUM CHLORIDE 0.9 % IV SOLN
250.0000 mL | INTRAVENOUS | Status: DC
  Administered 2013-12-02: 250 mL via INTRAVENOUS

## 2013-12-02 MED ORDER — PROPOFOL 10 MG/ML IV BOLUS
INTRAVENOUS | Status: DC | PRN
  Administered 2013-12-02: 50 mg via INTRAVENOUS

## 2013-12-02 MED ORDER — SODIUM CHLORIDE 0.9 % IJ SOLN
3.0000 mL | Freq: Two times a day (BID) | INTRAMUSCULAR | Status: DC
  Administered 2013-12-03: 3 mL via INTRAVENOUS

## 2013-12-02 MED ORDER — LIDOCAINE HCL (CARDIAC) 20 MG/ML IV SOLN
INTRAVENOUS | Status: DC | PRN
  Administered 2013-12-02: 50 mg via INTRAVENOUS

## 2013-12-02 MED ORDER — SODIUM CHLORIDE 0.9 % IJ SOLN: 3.0000 mL | INTRAMUSCULAR | Status: DC | PRN

## 2013-12-02 MED ORDER — POTASSIUM CHLORIDE CRYS ER 20 MEQ PO TBCR
20.0000 meq | EXTENDED_RELEASE_TABLET | Freq: Every day | ORAL | Status: DC
  Administered 2013-12-02: 20 meq via ORAL
  Filled 2013-12-02 (×2): qty 1

## 2013-12-02 NOTE — Op Note (Signed)
Patient anesthetized by anesthesia with Lidocaine and Propofol.  With the pads in the AP position the patient was cardioverted to SR with 200 J synchronized biphasic energy. Pacer interrogated.

## 2013-12-02 NOTE — H&P (View-Only) (Signed)
   SUBJECTIVE: The patient is doing well today.  At this time, she denies chest pain, shortness of breath, or any new concerns.  She had some sinus rhythm yesterday but has returned to aafib.  . cholecalciferol  2,000 Units Oral Daily  . diltiazem  240 mg Oral BID  . dofetilide  250 mcg Oral BID  . levothyroxine  88 mcg Oral QAC breakfast  . metoprolol succinate  50 mg Oral QPM  . mometasone-formoterol  2 puff Inhalation BID  . montelukast  10 mg Oral QHS  . potassium chloride  20 mEq Oral Daily  . Rivaroxaban  15 mg Oral Q supper  . sodium chloride  3 mL Intravenous Q12H      OBJECTIVE: Physical Exam: Filed Vitals:   12/01/13 1952 12/01/13 2046 12/02/13 0517 12/02/13 0650  BP:  128/86 119/88   Pulse:  100 98   Temp:  98 F (36.7 C) 97.7 F (36.5 C)   TempSrc:  Oral Oral   Resp:  19 18   Height:    5\' 4"  (1.626 m)  Weight:    134 lb 7.7 oz (61 kg)  SpO2: 98% 96% 98%     Intake/Output Summary (Last 24 hours) at 12/02/13 0734 Last data filed at 12/01/13 1300  Gross per 24 hour  Intake    480 ml  Output      0 ml  Net    480 ml    Telemetry reveals afib with elevated V rates, she had an atrial paced rhythm yesterday evening but is not back in afib.  GEN- The patient is well appearing, alert and oriented x 3 today.   Head- normocephalic, atraumatic Eyes-  Sclera clear, conjunctiva pink Ears- hearing intact Oropharynx- clear Neck- supple,  Lungs- Clear to ausculation bilaterally, normal work of breathing Heart- irregular rate and rhythm, no murmurs, rubs or gallops, PMI not laterally displaced GI- soft, NT, ND, + BS Extremities- no clubbing, cyanosis, + edema Skin- no rash or lesion Psych- euthymic mood, full affect Neuro- strength and sensation are intact  LABS: Basic Metabolic Panel:  Recent Labs  12/01/13 0534 12/02/13 0457  NA 141 144  K 4.0 3.8  CL 101 107  CO2 30 26  GLUCOSE 92 90  BUN 30* 19  CREATININE 0.94 0.74  CALCIUM 9.4 8.9  MG 2.2 2.0      ASSESSMENT AND PLAN:  Active Problems:   ATRIAL FIBRILLATION, PAROXYSMAL  1. afib Continue tikosyn 250 mcg bid today CrCl continues to improve with holding lasix.   Continue xarelto (she has been appropriately anticoagulated without interruption chronically) Risks benefits and alternatives to cardioversion were discussed with the patient who wishes to proceed.  We will schedule cardioversion for 11am today.  2. htn Stable No change required today  3. Chronic diastolic dysfunction Due to afib most likely.  Edema is worsened by diltiazem Lasix is on hold  4. Chronic renal insufficiency As above  Thompson Grayer, MD 12/02/2013 7:34 AM

## 2013-12-02 NOTE — Anesthesia Postprocedure Evaluation (Signed)
  Anesthesia Post-op Note  Patient: Jody Blake  Procedure(s) Performed: Procedure(s): CARDIOVERSION (N/A)  Patient Location: PACU  Anesthesia Type:General  Level of Consciousness: awake and alert   Airway and Oxygen Therapy: Patient Spontanous Breathing  Post-op Pain: none  Post-op Assessment: Post-op Vital signs reviewed, Patient's Cardiovascular Status Stable and Respiratory Function Stable  Post-op Vital Signs: Reviewed  Filed Vitals:   12/02/13 1241  BP: 130/84  Pulse: 118  Temp: 36.4 C  Resp: 19    Complications: No apparent anesthesia complications

## 2013-12-02 NOTE — Progress Notes (Signed)
   SUBJECTIVE: The patient is doing well today.  At this time, she denies chest pain, shortness of breath, or any new concerns.  She had some sinus rhythm yesterday but has returned to aafib.  . cholecalciferol  2,000 Units Oral Daily  . diltiazem  240 mg Oral BID  . dofetilide  250 mcg Oral BID  . levothyroxine  88 mcg Oral QAC breakfast  . metoprolol succinate  50 mg Oral QPM  . mometasone-formoterol  2 puff Inhalation BID  . montelukast  10 mg Oral QHS  . potassium chloride  20 mEq Oral Daily  . Rivaroxaban  15 mg Oral Q supper  . sodium chloride  3 mL Intravenous Q12H      OBJECTIVE: Physical Exam: Filed Vitals:   12/01/13 1952 12/01/13 2046 12/02/13 0517 12/02/13 0650  BP:  128/86 119/88   Pulse:  100 98   Temp:  98 F (36.7 C) 97.7 F (36.5 C)   TempSrc:  Oral Oral   Resp:  19 18   Height:    5\' 4"  (1.626 m)  Weight:    134 lb 7.7 oz (61 kg)  SpO2: 98% 96% 98%     Intake/Output Summary (Last 24 hours) at 12/02/13 0734 Last data filed at 12/01/13 1300  Gross per 24 hour  Intake    480 ml  Output      0 ml  Net    480 ml    Telemetry reveals afib with elevated V rates, she had an atrial paced rhythm yesterday evening but is not back in afib.  GEN- The patient is well appearing, alert and oriented x 3 today.   Head- normocephalic, atraumatic Eyes-  Sclera clear, conjunctiva pink Ears- hearing intact Oropharynx- clear Neck- supple,  Lungs- Clear to ausculation bilaterally, normal work of breathing Heart- irregular rate and rhythm, no murmurs, rubs or gallops, PMI not laterally displaced GI- soft, NT, ND, + BS Extremities- no clubbing, cyanosis, + edema Skin- no rash or lesion Psych- euthymic mood, full affect Neuro- strength and sensation are intact  LABS: Basic Metabolic Panel:  Recent Labs  12/01/13 0534 12/02/13 0457  NA 141 144  K 4.0 3.8  CL 101 107  CO2 30 26  GLUCOSE 92 90  BUN 30* 19  CREATININE 0.94 0.74  CALCIUM 9.4 8.9  MG 2.2 2.0      ASSESSMENT AND PLAN:  Active Problems:   ATRIAL FIBRILLATION, PAROXYSMAL  1. afib Continue tikosyn 250 mcg bid today CrCl continues to improve with holding lasix.   Continue xarelto (she has been appropriately anticoagulated without interruption chronically) Risks benefits and alternatives to cardioversion were discussed with the patient who wishes to proceed.  We will schedule cardioversion for 11am today.  2. htn Stable No change required today  3. Chronic diastolic dysfunction Due to afib most likely.  Edema is worsened by diltiazem Lasix is on hold  4. Chronic renal insufficiency As above  Thompson Grayer, MD 12/02/2013 7:34 AM

## 2013-12-02 NOTE — Interval H&P Note (Signed)
History and Physical Interval Note:  12/02/2013 8:04 AM  Jody Blake  has presented today for surgery, with the diagnosis of AFIB  The various methods of treatment have been discussed with the patient and family. After consideration of risks, benefits and other options for treatment, the patient has consented to  Procedure(s): CARDIOVERSION (N/A) as a surgical intervention .  The patient's history has been reviewed, patient examined, no change in status, stable for surgery.  I have reviewed the patient's chart and labs.  Questions were answered to the patient's satisfaction.     Dorris Carnes

## 2013-12-02 NOTE — Anesthesia Preprocedure Evaluation (Addendum)
Anesthesia Evaluation  Patient identified by MRN, date of birth, ID band Patient awake    Reviewed: Allergy & Precautions, H&P , NPO status , Patient's Chart, lab work & pertinent test results  Airway Mallampati: II TM Distance: >3 FB Neck ROM: Full    Dental no notable dental hx. (+) Teeth Intact, Dental Advisory Given   Pulmonary asthma , COPD COPD inhaler,  breath sounds clear to auscultation  Pulmonary exam normal       Cardiovascular hypertension, Pt. on medications and Pt. on home beta blockers +CHF + pacemaker + Valvular Problems/Murmurs AS Rhythm:Irregular Rate:Normal     Neuro/Psych negative neurological ROS  negative psych ROS   GI/Hepatic Neg liver ROS, GERD-  Controlled,  Endo/Other  Hypothyroidism   Renal/GU negative Renal ROS  negative genitourinary   Musculoskeletal   Abdominal   Peds  Hematology negative hematology ROS (+) anemia ,   Anesthesia Other Findings   Reproductive/Obstetrics negative OB ROS                          Anesthesia Physical Anesthesia Plan  ASA: III  Anesthesia Plan: General   Post-op Pain Management:    Induction: Intravenous  Airway Management Planned: Mask  Additional Equipment:   Intra-op Plan:   Post-operative Plan:   Informed Consent: I have reviewed the patients History and Physical, chart, labs and discussed the procedure including the risks, benefits and alternatives for the proposed anesthesia with the patient or authorized representative who has indicated his/her understanding and acceptance.   Dental advisory given  Plan Discussed with: CRNA  Anesthesia Plan Comments:         Anesthesia Quick Evaluation

## 2013-12-02 NOTE — Transfer of Care (Signed)
Immediate Anesthesia Transfer of Care Note  Patient: Jody Blake  Procedure(s) Performed: Procedure(s): CARDIOVERSION (N/A)  Patient Location: Endoscopy Unit  Anesthesia Type:General  Level of Consciousness: sedated  Airway & Oxygen Therapy: Patient Spontanous Breathing and Patient connected to nasal cannula oxygen  Post-op Assessment: Report given to PACU RN, Post -op Vital signs reviewed and stable and Patient moving all extremities  Post vital signs: Reviewed and stable  Complications: No apparent anesthesia complications

## 2013-12-03 ENCOUNTER — Other Ambulatory Visit: Payer: Self-pay | Admitting: Physician Assistant

## 2013-12-03 ENCOUNTER — Encounter (HOSPITAL_COMMUNITY): Payer: Self-pay | Admitting: Internal Medicine

## 2013-12-03 DIAGNOSIS — E876 Hypokalemia: Secondary | ICD-10-CM

## 2013-12-03 LAB — BASIC METABOLIC PANEL
Anion gap: 12 (ref 5–15)
BUN: 15 mg/dL (ref 6–23)
CO2: 23 mEq/L (ref 19–32)
Calcium: 9.6 mg/dL (ref 8.4–10.5)
Chloride: 107 mEq/L (ref 96–112)
Creatinine, Ser: 0.75 mg/dL (ref 0.50–1.10)
GFR, EST AFRICAN AMERICAN: 87 mL/min — AB (ref >90)
GFR, EST NON AFRICAN AMERICAN: 75 mL/min — AB (ref >90)
Glucose, Bld: 91 mg/dL (ref 70–99)
POTASSIUM: 3.4 meq/L — AB (ref 3.7–5.3)
Sodium: 142 mEq/L (ref 137–147)

## 2013-12-03 LAB — MAGNESIUM: MAGNESIUM: 1.8 mg/dL (ref 1.5–2.5)

## 2013-12-03 MED ORDER — POTASSIUM CHLORIDE CRYS ER 20 MEQ PO TBCR
40.0000 meq | EXTENDED_RELEASE_TABLET | Freq: Every day | ORAL | Status: DC
  Administered 2013-12-03: 40 meq via ORAL

## 2013-12-03 MED ORDER — METOPROLOL SUCCINATE ER 50 MG PO TB24
50.0000 mg | ORAL_TABLET | Freq: Two times a day (BID) | ORAL | Status: DC
  Administered 2013-12-03: 50 mg via ORAL
  Filled 2013-12-03 (×2): qty 1

## 2013-12-03 MED ORDER — POTASSIUM CHLORIDE CRYS ER 20 MEQ PO TBCR: 40.0000 meq | EXTENDED_RELEASE_TABLET | Freq: Every day | ORAL | Status: DC

## 2013-12-03 MED ORDER — METOPROLOL SUCCINATE ER 50 MG PO TB24
25.0000 mg | ORAL_TABLET | Freq: Two times a day (BID) | ORAL | Status: DC
Start: 2013-12-03 — End: 2013-12-20

## 2013-12-03 NOTE — Discharge Summary (Signed)
Discharge Summary   Patient ID: Jody Blake MRN: [id number], DOB/AGE: 78/11/1925 78 y.o. Admit date: 11/30/2013 D/C date:     12/03/2013  Primary Care Provider: Mathews Argyle, MD Primary Cardiologist: Dr. Marlou Porch, MD Primary Electrophysiologist: Dr. [name], MD   Primary Discharge Diagnoses:  1. Paroxysmal A-fib (on Xarelto 15 mg) 2. Tachy-brady syndrome s/p PPM 06/1999 battery exchange 06/2007 3. Chronic diastolic dysfunction (echo 09/06/12, grade 2)  4. Aortic stenosis  Secondary Discharge Diagnoses:  1. HLD 2. HTN 3. COPD 4. Uncontrolled hypothyroidism 5. Anemia  Hospital Course: 80 y/o F with PMHx significant for PAF on Xarelto 15 mg, tachy-brady syndrome s/p PPM 06/1999 with battery exchange 0/8657, diastolic dysfunction, aortic stenosis, HLD, HTN, COPD, and hypothyroidism who presented to [facility] on 11/30/13 for the initiation of Tikosyn. She had continued to have episodic atrial arrhythmias. Her a-fib was associated with decreased exercise tolerance and fatigue. She had BLE edema with increased diltiazem for rate control. Upon admission she denied symptoms of chest pain, shortness of breath, dizziness, presyncope, or syncope. She was otherwise without complaint.   Her admission EKG QT was 435 msec and initiation of Tikosyn was began at 250 mg bid. Her metoprolol was increased to 50 mg bid for elevated ventricular rates. The patient did well with the initiation of Tikosyn, had some sinus rhythm, but returned to a-fib, requiring cardioversion. She had remained appropriately anticoagulated with Xarelto without interruption, thus DCCV was able to be performed without TEE. She underwent successful DCCV to sinus rhythm by Dr. Harrington Challenger, MD on 12/02/13 at 200 J synchronized biphasic energy followed by pacer interrogation which demonstrated no significant change in functional status. Unfortunately she went back into a-fib post procedure and has remained that way with HR in the low 100s, asymptomatic.     She was seen by Dr. Caryl Comes 12/03/13 who discontinued her Tikosyn (verbally discussed with me in the hall on 2w), and stated she is ready for discharge. She will go home on her current medications with increased from Toprol-XL 25 mg daily to 50 bid during this admission and follow up with Dr. [name] to assess adequacy of rate control and consideration of RF ablation of AV node. Discharge labs include Mg 1.8, K+ 3.4, SCr 0.75. K Dur 40 meq will be added to her medications 2/2 hypokalemia with a recheck BMET in 1 week. She was seen by Dr. Caryl Comes today who feels like she is stable and ready for discharge.    Consults: None  Discharge Vitals: Blood pressure 142/86, pulse 109, temperature 97.8 F (36.6 C), temperature source Oral, resp. rate 18, height 5\' 4"  (1.626 m), weight 134 lb 7.7 oz (61 kg), last menstrual period 04/08/1974, SpO2 99.00%.  Labs: Lab Results  Component Value Date   WBC 7.1 04/12/2013   HGB 9.9* 04/12/2013   HCT 30.4* 04/12/2013   MCV 90.8 04/12/2013   PLT 435.0* 04/12/2013    Recent Labs Lab 12/03/13 0518  NA 142  K 3.4*  CL 107  CO2 23  BUN 15  CREATININE 0.75  CALCIUM 9.6  GLUCOSE 91    Diagnostic Studies/Procedures   Implantable Device check 12/02/13: Per EP notes demonstrated no significant changes in functional status  DCCV 12/02/13:  Patient anesthetized by anesthesia with Lidocaine and Propofol. With the pads in the AP position the patient was cardioverted to SR with 200 J synchronized biphasic energy.  Pacer interrogated.   Discharge Medications   Current Discharge Medication List    START taking these medications  Details  potassium chloride SA (K-DUR,KLOR-CON) 20 MEQ tablet Take 2 tablets (40 mEq total) by mouth daily. Qty: 30 tablet, Refills: 1      CONTINUE these medications which have CHANGED   Details  metoprolol succinate (TOPROL-XL) 50 MG 24 hr tablet Take 1 tablet (50 mg total) by mouth 2 (two) times daily. Qty: 60 tablet, Refills: 1        CONTINUE these medications which have NOT CHANGED   Details  albuterol (PROVENTIL HFA;VENTOLIN HFA) 108 (90 BASE) MCG/ACT inhaler Inhale 1-2 puffs into the lungs every 6 (six) hours as needed for wheezing or shortness of breath.    cholecalciferol (VITAMIN D) 1000 UNITS tablet Take 2,000 Units by mouth daily.    diltiazem (CARDIZEM CD) 240 MG 24 hr capsule Take 240 mg by mouth 2 (two) times daily.    diltiazem (CARDIZEM) 30 MG tablet Take 30 mg by mouth daily as needed (for hearbeat irregularity).    fluticasone (FLONASE) 50 MCG/ACT nasal spray Place 2 sprays into the nose daily as needed for rhinitis.    furosemide (LASIX) 40 MG tablet Take 40 mg by mouth 2 (two) times daily.    levothyroxine (SYNTHROID, LEVOTHROID) 88 MCG tablet Take 88 mcg by mouth daily before breakfast.    Lutein 20 MG CAPS Take 1 capsule by mouth daily.     Magnesium 200 MG TABS Take 200 mg by mouth daily.    mometasone-formoterol (DULERA) 100-5 MCG/ACT AERO Inhale 2 puffs into the lungs 2 (two) times daily.    montelukast (SINGULAIR) 10 MG tablet Take 10 mg by mouth at bedtime.    Multiple Vitamin (MULTIVITAMIN) tablet Take 1 tablet by mouth daily.     Multiple Vitamins-Minerals (PRESERVISION AREDS PO) Take 1 tablet by mouth 2 (two) times daily.     Rivaroxaban (XARELTO) 15 MG TABS tablet Take 15 mg by mouth daily with supper.        Disposition   The patient will be discharged in stable condition to home. Discharge Instructions   Diet - low sodium heart healthy    Complete by:  As directed      Increase activity slowly    Complete by:  As directed           Follow-up Information   Follow up with Ermalinda Barrios, PA-C On 12/20/2013. (APPT: 9:15 AM [facility]))    Specialty:  Cardiology   Contact information:   [address] [place] [address] [phone]       Follow up with [facility] lab [facility]  In 1 week. (Between 8-4)    Contact information:   1st floor  [place] office: [address], [place]        Duration of Discharge Encounter: Greater than 30 minutes including physician and PA time.  Melvern Banker PA-C 12/03/2013, 10:52 AM

## 2013-12-03 NOTE — Progress Notes (Signed)
Discharge education, medication, prescriptions, and follow-up appts reviewed with pt, pt stated understanding and questioned continuing lasix, IV and tele removed, family aware of discharge, will page PA for lasix clarification Rickard Rhymes, RN

## 2013-12-03 NOTE — Progress Notes (Signed)
Per Thurmond Butts PA pt okay to not continue lasix, pt notified Rickard Rhymes, RN

## 2013-12-03 NOTE — Progress Notes (Signed)
Patient Name: Jody Blake      SUBJECTIVE: pt admitted for dofetilide initiation at 250 mcg bid  Underwent TEE yday has reverted to AFib Interestingly sghe had been out of rhythm for most of the last 4 months unbeknownst to her  She feels hjer AFb mostly withHR >130  And device interrogation demonstrated no significant change in functional status  Past Medical History  Diagnosis Date  . Obstructive chronic bronchitis with exacerbation     Dr. [name]  . Rhinitis   . Hypertension   . Diverticulosis   . Asthmatic bronchitis   . Pure hypercholesterolemia   . Macular degeneration     Dr. [name]  . Aortic valve disorders   . Unspecified constipation   . Encounter for long-term (current) use of other medications   . COPD (chronic obstructive pulmonary disease)     Dr. [name]  . PAF (paroxysmal atrial fibrillation)     Stopped flecainide, on amiodarone but still has bouts of A FIB (mostly in mornings).   . Tachy-brady syndrome     s/p permanent pacemaker 06/27/1999 (Battery change 06/2007)  . Hypothyroidism     on medication  . Atrial fibrillation   . Anemia   . DVT (deep vein thrombosis) in pregnancy 1954    LLE  . Pacemaker   . Heart murmur   . CHF (congestive heart failure)   . Chronic bronchitis   . Osteoarthrosis, unspecified whether generalized or localized, other specified sites   . Arthritis     "probably in my back" (12/01/2013)  . DDD (degenerative disc disease)   . Chronic lower back pain   . Staghorn calculus     Left  . Benign hypertensive kidney disease with chronic kidney disease stage I through stage IV, or unspecified     Scheduled Meds:  Scheduled Meds: . cholecalciferol  2,000 Units Oral Daily  . diltiazem  240 mg Oral BID  . dofetilide  250 mcg Oral BID  . levothyroxine  88 mcg Oral QAC breakfast  . metoprolol succinate  50 mg Oral QPM  . mometasone-formoterol  2 puff Inhalation BID  . montelukast  10 mg Oral QHS  . potassium chloride  20  mEq Oral Daily  . Rivaroxaban  15 mg Oral Q supper  . sodium chloride  3 mL Intravenous Q12H  . sodium chloride  3 mL Intravenous Q12H   Continuous Infusions: . sodium chloride 250 mL (12/02/13 0757)   sodium chloride, albuterol, sodium chloride, sodium chloride    PHYSICAL EXAM Filed Vitals:   12/02/13 1443 12/02/13 2013 12/02/13 2042 12/03/13 0443  BP: 124/64 137/86  142/86  Pulse: 116 112 70 109  Temp: 97.5 F (36.4 C) 98.1 F (36.7 C)  97.8 F (36.6 C)  TempSrc: Oral Oral  Oral  Resp: 20 19 20 18   Height:      Weight:      SpO2: 100% 98% 96% 98%    Well developed and nourished in no acute distress HENT normal Neck supple with JVP-flat Carotids brisk and full without bruits Clear Irregularly irregular rate and rhythm with controlled ventricular response, no murmurs or gallops Abd-soft with active BS without hepatomegaly No Clubbing cyanosis edema Skin-warm and dry A & Oriented  Grossly normal sensory and motor function    TELEMETRY: Reviewed telemetry pt in afuib:    Intake/Output Summary (Last 24 hours) at 12/03/13 0855 Last data filed at 12/02/13 1311  Gross per 24 hour  Intake    200 ml  Output      0 ml  Net    200 ml    LABS: Basic Metabolic Panel:  Recent Labs Lab 11/30/13 1031 11/30/13 1646 12/01/13 0534 12/02/13 0457 12/03/13 0518  NA 139 136* 141 144 142  K 3.7 3.6* 4.0 3.8 3.4*  CL 96 95* 101 107 107  CO2 34* 30 30 26 23   GLUCOSE 89 173* 92 90 91  BUN 37* 41* 30* 19 15  CREATININE 1.16* 1.26* 0.94 0.74 0.75  CALCIUM 10.0 9.7 9.4 8.9 9.6  MG 2.3  --  2.2 2.0 1.8    ASSESSMENT AND PLAN:  Active Problems:   ATRIAL FIBRILLATION, PAROXYSMAL  Will discharge today Increase metop50 qd. To BID Have followup with Dr [name] to assess adequacy of rate control and consideration of RF ablation of AV node Replete K Discharge on preadmission labs x increased metoprolol    Signed, Virl Axe MD  12/03/2013

## 2013-12-10 ENCOUNTER — Ambulatory Visit (INDEPENDENT_AMBULATORY_CARE_PROVIDER_SITE_OTHER): Payer: Medicare Other | Admitting: *Deleted

## 2013-12-10 ENCOUNTER — Other Ambulatory Visit: Payer: Self-pay | Admitting: *Deleted

## 2013-12-10 DIAGNOSIS — E876 Hypokalemia: Secondary | ICD-10-CM | POA: Diagnosis not present

## 2013-12-10 LAB — BASIC METABOLIC PANEL
BUN: 32 mg/dL — ABNORMAL HIGH (ref 6–23)
CO2: 30 mEq/L (ref 19–32)
CREATININE: 1.1 mg/dL (ref 0.4–1.2)
Calcium: 9.4 mg/dL (ref 8.4–10.5)
Chloride: 103 mEq/L (ref 96–112)
GFR: 51.7 mL/min — AB (ref >60.00)
Glucose, Bld: 95 mg/dL (ref 70–99)
POTASSIUM: 4.2 meq/L (ref 3.5–5.1)
Sodium: 137 mEq/L (ref 135–145)

## 2013-12-10 MED ORDER — POTASSIUM CHLORIDE CRYS ER 20 MEQ PO TBCR: 40.0000 meq | EXTENDED_RELEASE_TABLET | Freq: Every day | ORAL | Status: DC

## 2013-12-15 DIAGNOSIS — R0602 Shortness of breath: Secondary | ICD-10-CM | POA: Diagnosis not present

## 2013-12-15 DIAGNOSIS — Z79899 Other long term (current) drug therapy: Secondary | ICD-10-CM | POA: Diagnosis not present

## 2013-12-15 DIAGNOSIS — R609 Edema, unspecified: Secondary | ICD-10-CM | POA: Diagnosis not present

## 2013-12-17 ENCOUNTER — Other Ambulatory Visit: Payer: Self-pay

## 2013-12-17 MED ORDER — FUROSEMIDE 40 MG PO TABS: 40.0000 mg | ORAL_TABLET | Freq: Every day | ORAL | Status: DC

## 2013-12-20 ENCOUNTER — Encounter: Payer: Self-pay | Admitting: Physician Assistant

## 2013-12-20 ENCOUNTER — Ambulatory Visit (INDEPENDENT_AMBULATORY_CARE_PROVIDER_SITE_OTHER): Payer: Medicare Other | Admitting: Physician Assistant

## 2013-12-20 VITALS — BP 130/82 | HR 104 | Ht 64.0 in | Wt 134.8 lb

## 2013-12-20 DIAGNOSIS — I4891 Unspecified atrial fibrillation: Secondary | ICD-10-CM | POA: Diagnosis not present

## 2013-12-20 DIAGNOSIS — I4819 Other persistent atrial fibrillation: Secondary | ICD-10-CM

## 2013-12-20 DIAGNOSIS — I509 Heart failure, unspecified: Secondary | ICD-10-CM

## 2013-12-20 DIAGNOSIS — Z7901 Long term (current) use of anticoagulants: Secondary | ICD-10-CM

## 2013-12-20 DIAGNOSIS — I495 Sick sinus syndrome: Secondary | ICD-10-CM

## 2013-12-20 DIAGNOSIS — R0602 Shortness of breath: Secondary | ICD-10-CM

## 2013-12-20 DIAGNOSIS — I5031 Acute diastolic (congestive) heart failure: Secondary | ICD-10-CM | POA: Diagnosis not present

## 2013-12-20 LAB — BRAIN NATRIURETIC PEPTIDE: Pro B Natriuretic peptide (BNP): 551 pg/mL — ABNORMAL HIGH (ref 0.0–100.0)

## 2013-12-20 LAB — BASIC METABOLIC PANEL
BUN: 29 mg/dL — ABNORMAL HIGH (ref 6–23)
CHLORIDE: 102 meq/L (ref 96–112)
CO2: 28 mEq/L (ref 19–32)
Calcium: 9.7 mg/dL (ref 8.4–10.5)
Creatinine, Ser: 1 mg/dL (ref 0.4–1.2)
GFR: 54.02 mL/min — ABNORMAL LOW (ref >60.00)
Glucose, Bld: 70 mg/dL (ref 70–99)
Potassium: 4.4 mEq/L (ref 3.5–5.1)
Sodium: 139 mEq/L (ref 135–145)

## 2013-12-20 MED ORDER — POTASSIUM CHLORIDE CRYS ER 20 MEQ PO TBCR: EXTENDED_RELEASE_TABLET | ORAL | Status: DC

## 2013-12-20 MED ORDER — FUROSEMIDE 40 MG PO TABS: ORAL_TABLET | ORAL | Status: DC

## 2013-12-20 MED ORDER — METOPROLOL SUCCINATE ER 50 MG PO TB24: ORAL_TABLET | ORAL | Status: DC

## 2013-12-20 NOTE — Progress Notes (Signed)
HPI: This is an 80 year old female patient of Dr. Marlou Porch & Dr. [name] who has history of paroxysmal atrial fibrillation on Xarelto 15 mg, tachybradycardia syndrome status post PPM 06/1999 with battery exchange in 06/8180, diastolic dysfunction, aortic stenosis hypertension, COPD. [facility] was [facility]/13 for initiation of Tikosyn. She underwent successful DC cardioversion to sinus rhythm by Dr. Harrington Challenger on 12/02/13. Unfortunately she went back into atrial fibrillation post procedure and has remained that way with heart rates in the low 100s. Her Tikosyn was discontinued by Dr. Caryl Comes. Her Toprol was increased to 50 mg twice a day. She will followup with Dr. [name] for consideration of radiofrequency ablation.  Patient also has tried diastolic dysfunction echo in 09/06/12 grade 2, moderate aortic stenosis, hypertension, hyperlipidemia, COPD, uncontrolled hypothyroidism, and anemia.  Patient complains of being more short of breath since she's been out of the hospital. Her heart rate has been between 80 and 110 at home. There is confusion over her diuretics. Her primary care called and told her to take Lasix twice a day because she had fluid buildup. Our office told her she was dehydrated and told her to hold the Lasix.  Allergies  Allergen Reactions  . Penicillins Hives  . Brimonidine Tartrate-Timolol     REACTION: systemic malaise amigen eye drop     Current Outpatient Prescriptions  Medication Sig Dispense Refill  . RYTHMOL SR 425 MG 12 hr capsule       . albuterol (PROVENTIL HFA;VENTOLIN HFA) 108 (90 BASE) MCG/ACT inhaler Inhale 1-2 puffs into the lungs every 6 (six) hours as needed for wheezing or shortness of breath.      . cephALEXin (KEFLEX) 500 MG capsule       . cholecalciferol (VITAMIN D) 1000 UNITS tablet Take 2,000 Units by mouth daily.      [name] diltiazem (CARDIZEM CD) 240 MG 24 hr capsule Take 240 mg by mouth 2 (two) times daily.      [name] diltiazem (CARDIZEM) 30 MG tablet  Take 30 mg by mouth daily as needed (for hearbeat irregularity).      . fluticasone (FLONASE) 50 MCG/ACT nasal spray Place 2 sprays into the nose daily as needed for rhinitis.      . furosemide (LASIX) 40 MG tablet Take 1 tablet (40 mg total) by mouth daily.  90 tablet  0  . levothyroxine (SYNTHROID, LEVOTHROID) 88 MCG tablet Take 88 mcg by mouth daily before breakfast.      . Lutein 20 MG CAPS Take 1 capsule by mouth daily.       . Magnesium 200 MG TABS Take 200 mg by mouth daily.      . metoprolol succinate (TOPROL-XL) 50 MG 24 hr tablet Take 1 tablet (50 mg total) by mouth 2 (two) times daily.  60 tablet  1  . mometasone-formoterol (DULERA) 100-5 MCG/ACT AERO Inhale 2 puffs into the lungs 2 (two) times daily.      . montelukast (SINGULAIR) 10 MG tablet Take 10 mg by mouth at bedtime.      . Multiple Vitamin (MULTIVITAMIN) tablet Take 1 tablet by mouth daily.       . Multiple Vitamins-Minerals (PRESERVISION AREDS PO) Take 1 tablet by mouth 2 (two) times daily.       . potassium chloride SA (K-DUR,KLOR-CON) 20 MEQ tablet Take 2 tablets (40 mEq total) by mouth daily.  60 tablet  1  . Rivaroxaban (XARELTO) 15 MG TABS tablet Take 15 mg by mouth  daily with supper.       No current facility-administered medications for this visit.    Past Medical History  Diagnosis Date  . Obstructive chronic bronchitis with exacerbation     Dr. [name]  . Rhinitis   . Hypertension   . Diverticulosis   . Asthmatic bronchitis   . Pure hypercholesterolemia   . Macular degeneration     Dr. [name]  . Aortic stenosis     a. Echo 09/06/12 EF 55-60%, no WMA, G2DD, Ao valve sclerosis w/ mod stenosis, LA mildly dilated, PA pressure 6mmHg  . Unspecified constipation   . Encounter for long-term (current) use of other medications   . COPD (chronic obstructive pulmonary disease)     Dr. [name]  . PAF (paroxysmal atrial fibrillation)     Stopped flecainide, on amiodarone but still has bouts of A FIB (mostly in mornings).    . Tachy-brady syndrome     s/p permanent pacemaker 06/27/1999 (Battery change 06/2007)  . Hypothyroidism     on medication  . Anemia   . DVT (deep vein thrombosis) in pregnancy 1954    a. LLE  . Pacemaker     a. Medtronic SN: [id number]  . Heart murmur   . Diastolic dysfunction     a. Echo 09/06/12 EF 55-60%, no WMA, G2DD, Ao valve sclerosis w/ mod stenosis, LA mildly dilated, PA pressure 72mmHg  . Chronic bronchitis   . Osteoarthrosis, unspecified whether generalized or localized, other specified sites   . Arthritis     "probably in my back" (12/01/2013)  . DDD (degenerative disc disease)   . Chronic lower back pain   . Staghorn calculus     Left  . Benign hypertensive kidney disease with chronic kidney disease stage I through stage IV, or unspecified     Past Surgical History  Procedure Laterality Date  . Partial nephrectomy Left 05/1974    stone disease  . Bunionectomy with hammertoe reconstruction Bilateral ~ 1990  . Total knee arthroplasty Right 2001  . Incisional hernia repair    . Cataract extraction w/ intraocular lens  implant, bilateral Bilateral   . Hernia repair    . Esophagogastroduodenoscopy (egd) with propofol N/A 04/22/2013    Procedure: ESOPHAGOGASTRODUODENOSCOPY (EGD) WITH PROPOFOL;  Surgeon: [name], MD;  Location: [facility] ENDOSCOPY;  Service: Endoscopy;  Laterality: N/A;  . Colonoscopy with propofol N/A 04/22/2013    Procedure: COLONOSCOPY WITH PROPOFOL;  Surgeon: [name], MD;  Location: [facility] ENDOSCOPY;  Service: Endoscopy;  Laterality: N/A;  . Tonsillectomy and adenoidectomy  1930's  . Appendectomy  ~ 1941  . Cardiac pacemaker placement  06/27/99    Medtronic PM implanted by Dr [name]  . Insert / replace / remove pacemaker  06/2007    "took out the old; put in new"  . Joint replacement    . Dilation and curettage of uterus  X 2    "when I was going thru menopause"  . Cardioversion N/A 12/02/2013    Procedure: CARDIOVERSION;  Surgeon: [name], MD;  Location: [facility] ENDOSCOPY;  Service: Cardiovascular;  Laterality: N/A;    Family History  Problem Relation Age of Onset  . Malignant hypertension Father   . Hypertension Father   . Renal Disease Father   . Breast cancer Mother   . Heart attack Brother   . Stroke Brother     History   Social History  . Marital Status: Married    Spouse Name: N/A  Number of Children: 2  . Years of Education: N/A   Occupational History  .    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 Smoking status: Never Smoker   . Smokeless tobacco: Never Used  . Alcohol Use: 4.2 oz/week    7 Glasses of wine per week  . Drug Use: No  . Sexual Activity: Not Currently    Partners: Male   Other Topics Concern  . Not on file   Social History Narrative  . No narrative on file    ROS: See history of present illness otherwise negative  BP 130/82  Pulse 104  Ht 5\' 4"  (1.626 m)  Wt 61.145 kg (134 lb 12.8 oz)  BMI 23.13 kg/m2  LMP 04/08/1974  PHYSICAL EXAM: Well-nournished, in no acute distress. Neck: No JVD, HJR, Bruit, or thyroid enlargement  Lungs: Decreased breath sounds with crackles at the left base  Cardiovascular: Irregular irregular at 027, 2/6 systolic murmur at the left sternal border, no gallops, bruit, thrill, or heave.  Abdomen: BS normal. Soft without organomegaly, masses, lesions or tenderness.  Extremities: +2 ankle edema bilaterally otherwise lower extremities without cyanosis, clubbing. Good distal pulses bilateral  SKin: Warm, no lesions or rashes   Musculoskeletal: No deformities  Neuro: no focal signs   Wt Readings from Last 3 Encounters:  12/02/13 61 kg (134 lb 7.7 oz)  12/02/13 61 kg (134 lb 7.7 oz)  11/25/13 62.959 kg (138 lb 12.8 oz)     EKG: Atrial fibrillation at 104 beats per minute poor R-wave progression  2-D echo 09/06/12: Study Conclusions  - Left ventricle: The cavity size was normal. There was mild   concentric  hypertrophy. Systolic function was normal. The   estimated ejection fraction was in the range of 55% to   60%. Wall motion was normal; there were no regional wall   motion abnormalities. Features are consistent with a   pseudonormal left ventricular filling pattern, with   concomitant abnormal relaxation and increased filling   pressure (grade 2 diastolic dysfunction). - Aortic valve: Moderate diffuse thickening and   calcification, consistent with sclerosis. Valve mobility   was moderately restricted. There was moderate stenosis.   Mild regurgitation. Valve area: 1.19cm^2(VTI). Valve area:   1.04cm^2 (Vmax). - Mitral valve: Calcified annulus. - Left atrium: The atrium was mildly dilated. - Right ventricle: The cavity size was mildly dilated. Wall   thickness was normal. - Right atrium: The atrium was mildly to moderately dilated. - Tricuspid valve: Moderate regurgitation. - Pulmonary arteries: PA peak pressure: 40mm Hg (S). Impressions:  - The right ventricular systolic pressure was increased   consistent with mild pulmonary hypertension

## 2013-12-20 NOTE — Patient Instructions (Addendum)
Your physician has recommended you make the following change in your medication:  1. INCREASE TOPROL XL 50 MG 1 1/2 TABLET DAILY  2. Increase Lasix 40 mg 1 tablet twice daily for 3 days then resume  1 tablet daily after 3. Increase your Potassium 20 meg to 3 tablets daily for 3 days then resume 2 tablets after  Your physician recommends that you have for lab work today : BMET, BNP   Your physician recommends that you schedule a follow-up appointment in: 2 week with Dr. [name] for possible ablation

## 2013-12-20 NOTE — Assessment & Plan Note (Signed)
I believe patient has fluid buildup because of atrial fibrillation with rapid ventricular rates. Increase Lasix to 40 mg twice a day for 3 days then 40 mg once a day increase potassium to 20 mEq 3 times a day for 3 days then twice a day. Check be met and BNP. Followup with Dr. [name] for possible ablation.entricular rates.

## 2013-12-20 NOTE — Assessment & Plan Note (Addendum)
Unfortunately patient is still in atrial fibrillation with some fast rates. She failed to descend and reverted to atrial fibrillation after successful DC cardioversion. I will increase her metoprolol to 75 mg twice a day. She will continue to monitor her heart rates at home and call us if it gets too slow. I think this is also causing her dyspnea and fluid buildup. Increase Lasix to 40 mg twice a day for 3 days then 40 mg once a day increase potassium to 20 mEq 3 times a day for 3 days then twice a day. Check be met and BNP. Followup with Dr. [name] for possible ablation.

## 2013-12-20 NOTE — Assessment & Plan Note (Signed)
Continue Xarelto 

## 2013-12-21 DIAGNOSIS — D2360 Other benign neoplasm of skin of unspecified upper limb, including shoulder: Secondary | ICD-10-CM | POA: Diagnosis not present

## 2013-12-21 DIAGNOSIS — L259 Unspecified contact dermatitis, unspecified cause: Secondary | ICD-10-CM | POA: Diagnosis not present

## 2013-12-21 DIAGNOSIS — Z85828 Personal history of other malignant neoplasm of skin: Secondary | ICD-10-CM | POA: Diagnosis not present

## 2013-12-21 DIAGNOSIS — L819 Disorder of pigmentation, unspecified: Secondary | ICD-10-CM | POA: Diagnosis not present

## 2013-12-21 DIAGNOSIS — D1801 Hemangioma of skin and subcutaneous tissue: Secondary | ICD-10-CM | POA: Diagnosis not present

## 2013-12-21 DIAGNOSIS — L821 Other seborrheic keratosis: Secondary | ICD-10-CM | POA: Diagnosis not present

## 2013-12-21 DIAGNOSIS — D2330 Other benign neoplasm of skin of unspecified part of face: Secondary | ICD-10-CM | POA: Diagnosis not present

## 2013-12-23 ENCOUNTER — Telehealth: Payer: Self-pay

## 2013-12-23 NOTE — Telephone Encounter (Signed)
Patient returned call to office. Asking about lab work she had drawn this week.  Pt made aware that her BUN, BNP and GFR are a little elevated. Confirmed with patient that she doubled her Lasix for three days after last appointment. No other changes to be made at this time.  Pt to be seen 9/28 with Allred, and instructed to call back with any questions or concerns before then.

## 2014-01-03 ENCOUNTER — Ambulatory Visit (INDEPENDENT_AMBULATORY_CARE_PROVIDER_SITE_OTHER): Payer: Medicare Other | Admitting: Internal Medicine

## 2014-01-03 ENCOUNTER — Encounter: Payer: Self-pay | Admitting: Internal Medicine

## 2014-01-03 VITALS — BP 124/72 | HR 52 | Ht 64.0 in | Wt 135.8 lb

## 2014-01-03 DIAGNOSIS — I495 Sick sinus syndrome: Secondary | ICD-10-CM | POA: Diagnosis not present

## 2014-01-03 DIAGNOSIS — I10 Essential (primary) hypertension: Secondary | ICD-10-CM

## 2014-01-03 DIAGNOSIS — I4891 Unspecified atrial fibrillation: Secondary | ICD-10-CM | POA: Diagnosis not present

## 2014-01-03 DIAGNOSIS — I482 Chronic atrial fibrillation, unspecified: Secondary | ICD-10-CM

## 2014-01-03 LAB — MDC_IDC_ENUM_SESS_TYPE_INCLINIC
Battery Voltage: 2.93 V
Brady Statistic AP VP Percent: 0.12 %
Brady Statistic AP VS Percent: 15.02 %
Brady Statistic AS VP Percent: 1.69 %
Brady Statistic AS VS Percent: 83.17 %
Brady Statistic RV Percent Paced: 1.81 %
Lead Channel Impedance Value: 504 Ohm
Lead Channel Impedance Value: 536 Ohm
Lead Channel Pacing Threshold Amplitude: 1 V
Lead Channel Sensing Intrinsic Amplitude: 0.7888
Lead Channel Sensing Intrinsic Amplitude: 9.1411
Lead Channel Setting Pacing Amplitude: 2 V
Lead Channel Setting Pacing Amplitude: 2.5 V
Lead Channel Setting Pacing Pulse Width: 0.6 ms
Lead Channel Setting Sensing Sensitivity: 0.9 mV
MDC IDC MSMT LEADCHNL RV PACING THRESHOLD PULSEWIDTH: 0.6 ms
MDC IDC SESS DTM: 20150928123453
MDC IDC SET ZONE DETECTION INTERVAL: 200 ms
MDC IDC SET ZONE DETECTION INTERVAL: 400 ms
MDC IDC STAT BRADY RA PERCENT PACED: 15.14 %
Zone Setting Detection Interval: 400 ms

## 2014-01-03 MED ORDER — METOPROLOL SUCCINATE ER 100 MG PO TB24: 100.0000 mg | ORAL_TABLET | Freq: Two times a day (BID) | ORAL | Status: DC

## 2014-01-03 NOTE — Patient Instructions (Signed)
Your physician has recommended you make the following change in your medication:  1.) INCREASE TOPROL XL TO 100 MG TWICE DAILY Your physician recommends that you schedule a follow-up appointment in: [place].

## 2014-01-03 NOTE — Progress Notes (Signed)
PCP: Mathews Argyle, MD Primary Cardiologist: Cristy Folks Jody Blake is a 80 y.o. female who presents today for routine electrophysiology followup.  She continues to have episodic atrial arrhythmias.  Her afib is associated with decreased exercise tolerance, SOB and fatigue. She has BLE edema with increased diltiazem for rate control.  She denies palpitations. Today, she denies symptoms of chest pain,dizziness, presyncope, or syncope.  The patient is otherwise without complaint today.   Past Medical History  Diagnosis Date  . Obstructive chronic bronchitis with exacerbation     Dr. [name]  . Rhinitis   . Hypertension   . Diverticulosis   . Asthmatic bronchitis   . Pure hypercholesterolemia   . Macular degeneration     Dr. [name]  . Aortic stenosis     a. Echo 09/06/12 EF 55-60%, no WMA, G2DD, Ao valve sclerosis w/ mod stenosis, LA mildly dilated, PA pressure 58mmHg  . Unspecified constipation   . Encounter for long-term (current) use of other medications   . COPD (chronic obstructive pulmonary disease)     Dr. [name]  . PAF (paroxysmal atrial fibrillation)     Stopped flecainide, on amiodarone but still has bouts of A FIB (mostly in mornings).   . Tachy-brady syndrome     s/p permanent pacemaker 06/27/1999 (Battery change 06/2007)  . Hypothyroidism     on medication  . Anemia   . DVT (deep vein thrombosis) in pregnancy 1954    a. LLE  . Pacemaker     a. Medtronic SN: [id number]  . Heart murmur   . Diastolic dysfunction     a. Echo 09/06/12 EF 55-60%, no WMA, G2DD, Ao valve sclerosis w/ mod stenosis, LA mildly dilated, PA pressure 53mmHg  . Chronic bronchitis   . Osteoarthrosis, unspecified whether generalized or localized, other specified sites   . Arthritis     "probably in my back" (12/01/2013)  . DDD (degenerative disc disease)   . Chronic lower back pain   . Staghorn calculus     Left  . Benign hypertensive kidney disease with chronic kidney disease stage I through stage  IV, or unspecified    Past Surgical History  Procedure Laterality Date  . Partial nephrectomy Left 05/1974    stone disease  . Bunionectomy with hammertoe reconstruction Bilateral ~ 1990  . Total knee arthroplasty Right 2001  . Incisional hernia repair    . Cataract extraction w/ intraocular lens  implant, bilateral Bilateral   . Hernia repair    . Esophagogastroduodenoscopy (egd) with propofol N/A 04/22/2013    Procedure: ESOPHAGOGASTRODUODENOSCOPY (EGD) WITH PROPOFOL;  Surgeon: [name], MD;  Location: [facility] ENDOSCOPY;  Service: Endoscopy;  Laterality: N/A;  . Colonoscopy with propofol N/A 04/22/2013    Procedure: COLONOSCOPY WITH PROPOFOL;  Surgeon: [name], MD;  Location: [facility] ENDOSCOPY;  Service: Endoscopy;  Laterality: N/A;  . Tonsillectomy and adenoidectomy  1930's  . Appendectomy  ~ 1941  . Cardiac pacemaker placement  06/27/99    Medtronic PM implanted by Dr [name]  . Insert / replace / remove pacemaker  06/2007    "took out the old; put in new"  . Joint replacement    . Dilation and curettage of uterus  X 2    "when I was going thru menopause"  . Cardioversion N/A 12/02/2013    Procedure: CARDIOVERSION;  Surgeon: [name], MD;  Location: [facility] ENDOSCOPY;  Service: Cardiovascular;  Laterality: N/A;    Current Outpatient Prescriptions  Medication Sig Dispense  Refill  . albuterol (PROVENTIL HFA;VENTOLIN HFA) 108 (90 BASE) MCG/ACT inhaler Inhale 1-2 puffs into the lungs every 6 (six) hours as needed for wheezing or shortness of breath.      . cholecalciferol (VITAMIN D) 1000 UNITS tablet Take 2,000 Units by mouth daily.      [name] diltiazem (CARDIZEM CD) 240 MG 24 hr capsule Take 240 mg by mouth 2 (two) times daily.      [name] diltiazem (CARDIZEM) 30 MG tablet Take 30 mg by mouth daily as needed (for hearbeat irregularity).      . fluticasone (FLONASE) 50 MCG/ACT nasal spray Place 2 sprays into the nose daily as needed for rhinitis.      . furosemide (LASIX) 40 MG tablet  Take 2 tablets daily      . levothyroxine (SYNTHROID, LEVOTHROID) 88 MCG tablet Take 88 mcg by mouth daily before breakfast.      . Lutein 20 MG CAPS Take 1 capsule by mouth daily.       . Magnesium 200 MG TABS Take 200 mg by mouth daily.      . mometasone-formoterol (DULERA) 100-5 MCG/ACT AERO Inhale 2 puffs into the lungs 2 (two) times daily.      . montelukast (SINGULAIR) 10 MG tablet Take 10 mg by mouth at bedtime.      . Multiple Vitamin (MULTIVITAMIN) tablet Take 1 tablet by mouth daily.       . Multiple Vitamins-Minerals (PRESERVISION AREDS PO) Take 1 tablet by mouth 2 (two) times daily.       . potassium chloride SA (K-DUR,KLOR-CON) 20 MEQ tablet 3 tablets for 3 days then resume 2 tablets daily (Pt is currently taking 2 tablets a day 01/03/14)      . Rivaroxaban (XARELTO) 15 MG TABS tablet Take 15 mg by mouth daily with supper.      . metoprolol succinate (TOPROL-XL) 100 MG 24 hr tablet Take 1 tablet (100 mg total) by mouth 2 (two) times daily. Take with or immediately following a meal.  90 tablet  3   No current facility-administered medications for this visit.   ROS- all systems are reviewed and negative except as per HPI above  Physical Exam: Filed Vitals:   01/03/14 1214  BP: 124/72  Pulse: 52  Height: 5\' 4"  (1.626 m)  Weight: 135 lb 12.8 oz (61.598 kg)    GEN- The patient is well appearing, alert and oriented x 3 today.   Head- normocephalic, atraumatic Eyes-  Sclera clear, conjunctiva pink Ears- hearing intact Oropharynx- clear Lungs- Clear to ausculation bilaterally, normal work of breathing Chest- pacemaker pocket is well healed Heart- irregular rate and rhythm,   GI- soft, NT, ND, + BS Extremities- no clubbing, cyanosis, +2 edema  Pacemaker interrogation- reviewed in detail today,  See PACEART report  Assessment and Plan:  1. Tachy/brady Normal pacemaker function See Pace Art report No changes today  2.  Atrial fibrillation/ atrial flutter She has failed  medical therapy with tikosyn and amiodarone.  Her atrial arrhythmias appear very difficult to control.  Given her advanced age, I would not recommend PVI for her. She would like to avoid AV nodal ablation.  I will increase toprol to 100mg  BID today.  IF her V rates cannot be controlled with this then AV nodal ablation may be our best option. Return in 4 weeks for further discussion.  3.  HTN Stable No change required today  Return in 4weeks Follow-up with Dr Marlou Porch as scheduled

## 2014-01-05 DIAGNOSIS — R609 Edema, unspecified: Secondary | ICD-10-CM | POA: Diagnosis not present

## 2014-01-05 DIAGNOSIS — I4891 Unspecified atrial fibrillation: Secondary | ICD-10-CM | POA: Diagnosis not present

## 2014-01-05 DIAGNOSIS — Z23 Encounter for immunization: Secondary | ICD-10-CM | POA: Diagnosis not present

## 2014-01-05 DIAGNOSIS — N183 Chronic kidney disease, stage 3 unspecified: Secondary | ICD-10-CM | POA: Diagnosis not present

## 2014-01-05 DIAGNOSIS — I129 Hypertensive chronic kidney disease with stage 1 through stage 4 chronic kidney disease, or unspecified chronic kidney disease: Secondary | ICD-10-CM | POA: Diagnosis not present

## 2014-01-07 ENCOUNTER — Encounter: Payer: Self-pay | Admitting: Internal Medicine

## 2014-01-13 DIAGNOSIS — Z79899 Other long term (current) drug therapy: Secondary | ICD-10-CM | POA: Diagnosis not present

## 2014-01-26 ENCOUNTER — Ambulatory Visit (INDEPENDENT_AMBULATORY_CARE_PROVIDER_SITE_OTHER): Payer: Medicare Other | Admitting: Internal Medicine

## 2014-01-26 ENCOUNTER — Encounter: Payer: Self-pay | Admitting: Internal Medicine

## 2014-01-26 VITALS — BP 118/86 | HR 104 | Ht 64.0 in | Wt 138.8 lb

## 2014-01-26 DIAGNOSIS — I495 Sick sinus syndrome: Secondary | ICD-10-CM

## 2014-01-26 DIAGNOSIS — I481 Persistent atrial fibrillation: Secondary | ICD-10-CM

## 2014-01-26 DIAGNOSIS — Z950 Presence of cardiac pacemaker: Secondary | ICD-10-CM

## 2014-01-26 DIAGNOSIS — I4819 Other persistent atrial fibrillation: Secondary | ICD-10-CM

## 2014-01-26 NOTE — Patient Instructions (Signed)
Your physician recommends that you schedule a follow-up appointment in: 3 months in the afib clinic at the hospital

## 2014-01-26 NOTE — Progress Notes (Signed)
PCP: Mathews Argyle, MD Primary Cardiologist: Cristy Folks Jody Blake is a 80 y.o. female who presents today for routine electrophysiology followup.  She continues to have episodic atrial arrhythmias.  Her afib is associated with decreased exercise tolerance, SOB and fatigue. Beta blocker  was increased on last visit, due to pt wishing to  avoid AV ablation. She fells like she is not any better nor any worse. She is able to walk 10-15 mins a day without too many symptoms. Chronic sob/pedal edema stable.   Today, she denies symptoms of chest pain,dizziness, presyncope, or syncope.  The patient is otherwise without complaint today.   Past Medical History  Diagnosis Date  . Obstructive chronic bronchitis with exacerbation     Dr. [name]  . Rhinitis   . Hypertension   . Diverticulosis   . Asthmatic bronchitis   . Pure hypercholesterolemia   . Macular degeneration     Dr. [name]  . Aortic stenosis     a. Echo 09/06/12 EF 55-60%, no WMA, G2DD, Ao valve sclerosis w/ mod stenosis, LA mildly dilated, PA pressure 64mmHg  . Unspecified constipation   . Encounter for long-term (current) use of other medications   . COPD (chronic obstructive pulmonary disease)     Dr. [name]  . PAF (paroxysmal atrial fibrillation)     Stopped flecainide, on amiodarone but still has bouts of A FIB (mostly in mornings).   . Tachy-brady syndrome     s/p permanent pacemaker 06/27/1999 (Battery change 06/2007)  . Hypothyroidism     on medication  . Anemia   . DVT (deep vein thrombosis) in pregnancy 1954    a. LLE  . Pacemaker     a. Medtronic SN: [id number]  . Heart murmur   . Diastolic dysfunction     a. Echo 09/06/12 EF 55-60%, no WMA, G2DD, Ao valve sclerosis w/ mod stenosis, LA mildly dilated, PA pressure 50mmHg  . Chronic bronchitis   . Osteoarthrosis, unspecified whether generalized or localized, other specified sites   . Arthritis     "probably in my back" (12/01/2013)  . DDD (degenerative disc disease)    . Chronic lower back pain   . Staghorn calculus     Left  . Benign hypertensive kidney disease with chronic kidney disease stage I through stage IV, or unspecified    Past Surgical History  Procedure Laterality Date  . Partial nephrectomy Left 05/1974    stone disease  . Bunionectomy with hammertoe reconstruction Bilateral ~ 1990  . Total knee arthroplasty Right 2001  . Incisional hernia repair    . Cataract extraction w/ intraocular lens  implant, bilateral Bilateral   . Hernia repair    . Esophagogastroduodenoscopy (egd) with propofol N/A 04/22/2013    Procedure: ESOPHAGOGASTRODUODENOSCOPY (EGD) WITH PROPOFOL;  Surgeon: [name], MD;  Location: [facility] ENDOSCOPY;  Service: Endoscopy;  Laterality: N/A;  . Colonoscopy with propofol N/A 04/22/2013    Procedure: COLONOSCOPY WITH PROPOFOL;  Surgeon: [name], MD;  Location: [facility] ENDOSCOPY;  Service: Endoscopy;  Laterality: N/A;  . Tonsillectomy and adenoidectomy  1930's  . Appendectomy  ~ 1941  . Cardiac pacemaker placement  06/27/99    Medtronic PM implanted by Dr [name]  . Insert / replace / remove pacemaker  06/2007    "took out the old; put in new"  . Joint replacement    . Dilation and curettage of uterus  X 2    "when I was going thru menopause"  . Cardioversion  N/A 12/02/2013    Procedure: CARDIOVERSION;  Surgeon: [name], MD;  Location: [facility] ENDOSCOPY;  Service: Cardiovascular;  Laterality: N/A;    Current Outpatient Prescriptions  Medication Sig Dispense Refill  . albuterol (PROVENTIL HFA;VENTOLIN HFA) 108 (90 BASE) MCG/ACT inhaler Inhale 1-2 puffs into the lungs every 6 (six) hours as needed for wheezing or shortness of breath.      . cholecalciferol (VITAMIN D) 1000 UNITS tablet Take 2,000 Units by mouth daily.      [name] diltiazem (CARDIZEM CD) 240 MG 24 hr capsule Take 240 mg by mouth 2 (two) times daily.      [name] diltiazem (CARDIZEM) 30 MG tablet Take 30 mg by mouth daily as needed (for hearbeat irregularity).       . fluticasone (FLONASE) 50 MCG/ACT nasal spray Place 2 sprays into the nose daily as needed for rhinitis.      . furosemide (LASIX) 40 MG tablet Take 2 tablets daily      . levothyroxine (SYNTHROID, LEVOTHROID) 88 MCG tablet Take 88 mcg by mouth daily before breakfast.      . Lutein 20 MG CAPS Take 1 capsule by mouth daily.       . Magnesium 250 MG TABS Take 1 tablet by mouth daily.      . metoprolol succinate (TOPROL-XL) 100 MG 24 hr tablet Take 1 tablet (100 mg total) by mouth 2 (two) times daily. Take with or immediately following a meal.  90 tablet  3  . mometasone-formoterol (DULERA) 100-5 MCG/ACT AERO Inhale 2 puffs into the lungs 2 (two) times daily.      . montelukast (SINGULAIR) 10 MG tablet Take 10 mg by mouth at bedtime.      . Multiple Vitamin (MULTIVITAMIN) tablet Take 1 tablet by mouth daily.       . Multiple Vitamins-Minerals (PRESERVISION AREDS PO) Take 1 tablet by mouth 2 (two) times daily.       . potassium chloride SA (K-DUR,KLOR-CON) 20 MEQ tablet Take 20 mEq by mouth 2 (two) times daily.       . Rivaroxaban (XARELTO) 15 MG TABS tablet Take 15 mg by mouth daily with supper.       No current facility-administered medications for this visit.   ROS- all systems are reviewed and negative except as per HPI above  Physical Exam: BP 118/86, HR 104 irreg, wt 138 lb  GEN- The patient is well appearing, alert and oriented x 3 today.   Head- normocephalic, atraumatic Eyes-  Sclera clear, conjunctiva pink Ears- hearing intact Oropharynx- clear Lungs- Clear to ausculation bilaterally, normal work of breathing Chest- pacemaker pocket is well healed Heart- irregular rate and rhythm,   GI- soft, NT, ND, + BS Extremities- no clubbing, cyanosis, +2 edema, rt worse than left  EKG- afib 104 bpm, LAD, IRBBB.  Assessment and Plan:   1.  Atrial fibrillation/ atrial flutter She has failed medical therapy with tikosyn and amiodarone.  Her atrial arrhythmias appear very difficult to  control.  Given her advanced age, I would not recommend PVI for her. She would like to avoid AV nodal ablation. Currently she is able to be active without too many symptoms. .She would prefer to continue current therapy without any changes or procedures at this time.  3.  HTN Stable No change required today  Return 3 months afib clinic.

## 2014-02-07 ENCOUNTER — Encounter: Payer: Self-pay | Admitting: Internal Medicine

## 2014-02-08 ENCOUNTER — Telehealth: Payer: Self-pay | Admitting: *Deleted

## 2014-02-08 NOTE — Telephone Encounter (Signed)
No call. Error. I selected wrong tab.

## 2014-02-15 ENCOUNTER — Other Ambulatory Visit: Payer: Self-pay | Admitting: Cardiology

## 2014-04-06 DIAGNOSIS — I10 Essential (primary) hypertension: Secondary | ICD-10-CM | POA: Diagnosis not present

## 2014-04-06 DIAGNOSIS — I481 Persistent atrial fibrillation: Secondary | ICD-10-CM | POA: Diagnosis not present

## 2014-04-27 ENCOUNTER — Encounter: Payer: Self-pay | Admitting: Nurse Practitioner

## 2014-04-27 ENCOUNTER — Other Ambulatory Visit: Payer: Self-pay | Admitting: *Deleted

## 2014-04-27 ENCOUNTER — Ambulatory Visit (INDEPENDENT_AMBULATORY_CARE_PROVIDER_SITE_OTHER): Payer: Medicare Other | Admitting: *Deleted

## 2014-04-27 ENCOUNTER — Encounter: Payer: Self-pay | Admitting: *Deleted

## 2014-04-27 ENCOUNTER — Ambulatory Visit (INDEPENDENT_AMBULATORY_CARE_PROVIDER_SITE_OTHER): Payer: Medicare Other | Admitting: Nurse Practitioner

## 2014-04-27 VITALS — BP 132/86 | HR 91 | Ht 64.0 in | Wt 141.8 lb

## 2014-04-27 DIAGNOSIS — I482 Chronic atrial fibrillation, unspecified: Secondary | ICD-10-CM

## 2014-04-27 DIAGNOSIS — I4819 Other persistent atrial fibrillation: Secondary | ICD-10-CM

## 2014-04-27 DIAGNOSIS — I481 Persistent atrial fibrillation: Secondary | ICD-10-CM

## 2014-04-27 DIAGNOSIS — Z01812 Encounter for preprocedural laboratory examination: Secondary | ICD-10-CM

## 2014-04-27 DIAGNOSIS — Z45018 Encounter for adjustment and management of other part of cardiac pacemaker: Secondary | ICD-10-CM

## 2014-04-27 DIAGNOSIS — I495 Sick sinus syndrome: Secondary | ICD-10-CM

## 2014-04-27 DIAGNOSIS — I10 Essential (primary) hypertension: Secondary | ICD-10-CM | POA: Diagnosis not present

## 2014-04-27 LAB — MDC_IDC_ENUM_SESS_TYPE_INCLINIC
Brady Statistic AP VP Percent: 0.07 %
Brady Statistic AP VS Percent: 0.02 %
Brady Statistic AS VP Percent: 2.68 %
Brady Statistic AS VS Percent: 97.23 %
Brady Statistic RA Percent Paced: 0.09 %
Brady Statistic RV Percent Paced: 2.75 %
Lead Channel Impedance Value: 528 Ohm
Lead Channel Impedance Value: 552 Ohm
Lead Channel Pacing Threshold Pulse Width: 0.6 ms
Lead Channel Sensing Intrinsic Amplitude: 1.3585
Lead Channel Setting Pacing Amplitude: 2.5 V
Lead Channel Setting Pacing Pulse Width: 0.6 ms
MDC IDC MSMT BATTERY VOLTAGE: 2.9 V
MDC IDC MSMT LEADCHNL RV PACING THRESHOLD AMPLITUDE: 1 V
MDC IDC MSMT LEADCHNL RV SENSING INTR AMPL: 6.0941
MDC IDC SESS DTM: 20160120104131
MDC IDC SET LEADCHNL RV SENSING SENSITIVITY: 0.9 mV
MDC IDC SET ZONE DETECTION INTERVAL: 400 ms
Zone Setting Detection Interval: 400 ms

## 2014-04-27 NOTE — Patient Instructions (Signed)
Your physician recommends that you continue on your current medications as directed. Please refer to the Current Medication list given to you today.  Your physician recommends that you return for lab work on 05/03/14. You may come anytime between [place] physician has recommended that you have a pacemaker generator change & AVN ablation. Please see the instruction sheet given to you today for more information.  Your wound check is scheduled for 05/23/14 at 2:30 pm at [address]

## 2014-04-27 NOTE — Progress Notes (Signed)
PCP: Mathews Argyle, MD Primary Cardiologist: Cristy Folks Jody Blake is a 81 y.o. female who presents today for routine electrophysiology followup for permanent afib. Continues on NOAC with good tolerance. She is still symptomatic with exertional dyspnea and pedal edema, possibly worse over the last few months. Cardizem thought to be contributing to pedal edema but currently needs for rate control. She did discuss with Dr. [name] re AV nodal ablation on last visit so she would be less symptomatic and could decrease some rate control meds. PPM interrogation today shows ERI. Dr. [name] in and discussed with pt generator change out with an AV nodal ablation as previously discussed, but pt wanted to avoid. She is now willing to pursue due to increase of symptoms.  Stressed that the pt would be pacer dependent. She is aware and wants to proceed..     Today, she denies symptoms of chest pain,dizziness, presyncope, or syncope. Positive as above for exertional dyspnea and pedal edema. The patient is otherwise without complaint today.   Past Medical History  Diagnosis Date  . Obstructive chronic bronchitis with exacerbation     Dr. [name]  . Rhinitis   . Hypertension   . Diverticulosis   . Asthmatic bronchitis   . Pure hypercholesterolemia   . Macular degeneration     Dr. [name]  . Aortic stenosis     a. Echo 09/06/12 EF 55-60%, no WMA, G2DD, Ao valve sclerosis w/ mod stenosis, LA mildly dilated, PA pressure 61mmHg  . Unspecified constipation   . Encounter for long-term (current) use of other medications   . COPD (chronic obstructive pulmonary disease)     Dr. [name]  . PAF (paroxysmal atrial fibrillation)     Stopped flecainide, on amiodarone but still has bouts of A FIB (mostly in mornings).   . Tachy-brady syndrome     s/p permanent pacemaker 06/27/1999 (Battery change 06/2007)  . Hypothyroidism     on medication  . Anemia   . DVT (deep vein thrombosis) in pregnancy 1954    a. LLE    . Pacemaker     a. Medtronic SN: [id number]  . Heart murmur   . Diastolic dysfunction     a. Echo 09/06/12 EF 55-60%, no WMA, G2DD, Ao valve sclerosis w/ mod stenosis, LA mildly dilated, PA pressure 26mmHg  . Chronic bronchitis   . Osteoarthrosis, unspecified whether generalized or localized, other specified sites   . Arthritis     "probably in my back" (12/01/2013)  . DDD (degenerative disc disease)   . Chronic lower back pain   . Staghorn calculus     Left  . Benign hypertensive kidney disease with chronic kidney disease stage I through stage IV, or unspecified    Past Surgical History  Procedure Laterality Date  . Partial nephrectomy Left 05/1974    stone disease  . Bunionectomy with hammertoe reconstruction Bilateral ~ 1990  . Total knee arthroplasty Right 2001  . Incisional hernia repair    . Cataract extraction w/ intraocular lens  implant, bilateral Bilateral   . Hernia repair    . Esophagogastroduodenoscopy (egd) with propofol N/A 04/22/2013    Procedure: ESOPHAGOGASTRODUODENOSCOPY (EGD) WITH PROPOFOL;  Surgeon: [name], MD;  Location: [facility] ENDOSCOPY;  Service: Endoscopy;  Laterality: N/A;  . Colonoscopy with propofol N/A 04/22/2013    Procedure: COLONOSCOPY WITH PROPOFOL;  Surgeon: [name], MD;  Location: [facility] ENDOSCOPY;  Service: Endoscopy;  Laterality: N/A;  . Tonsillectomy and adenoidectomy  1930's  .  Appendectomy  ~ 1941  . Cardiac pacemaker placement  06/27/99    Medtronic PM implanted by Dr [name]  . Insert / replace / remove pacemaker  06/2007    "took out the old; put in new"  . Joint replacement    . Dilation and curettage of uterus  X 2    "when I was going thru menopause"  . Cardioversion N/A 12/02/2013    Procedure: CARDIOVERSION;  Surgeon: [name], MD;  Location: [facility] ENDOSCOPY;  Service: Cardiovascular;  Laterality: N/A;    Current Outpatient Prescriptions  Medication Sig Dispense Refill  . albuterol (PROVENTIL HFA;VENTOLIN HFA) 108 (90  BASE) MCG/ACT inhaler Inhale 1-2 puffs into the lungs every 6 (six) hours as needed for wheezing or shortness of breath.    . cholecalciferol (VITAMIN D) 1000 UNITS tablet Take 2,000 Units by mouth daily.    [name] diltiazem (CARDIZEM CD) 240 MG 24 hr capsule Take 240 mg by mouth 2 (two) times daily.    [name] diltiazem (CARDIZEM) 30 MG tablet Take 30 mg by mouth daily as needed (for hearbeat irregularity).    . fluticasone (FLONASE) 50 MCG/ACT nasal spray Place 2 sprays into the nose daily as needed for rhinitis.    . furosemide (LASIX) 40 MG tablet Take 2 tablets daily    . levothyroxine (SYNTHROID, LEVOTHROID) 88 MCG tablet Take 88 mcg by mouth daily before breakfast.    . Lutein 20 MG CAPS Take 1 capsule by mouth daily.     . Magnesium 250 MG TABS Take 1 tablet by mouth daily.    . metoprolol succinate (TOPROL-XL) 100 MG 24 hr tablet Take 1 tablet (100 mg total) by mouth 2 (two) times daily. Take with or immediately following a meal. 90 tablet 3  . mometasone-formoterol (DULERA) 100-5 MCG/ACT AERO Inhale 2 puffs into the lungs 2 (two) times daily.    . montelukast (SINGULAIR) 10 MG tablet Take 10 mg by mouth at bedtime.    . Multiple Vitamin (MULTIVITAMIN) tablet Take 1 tablet by mouth daily.     . Multiple Vitamins-Minerals (PRESERVISION AREDS PO) Take 1 tablet by mouth 2 (two) times daily.     . potassium chloride SA (K-DUR,KLOR-CON) 20 MEQ tablet TAKE 2 TABLETS DAILY. 60 tablet 6  . Rivaroxaban (XARELTO) 15 MG TABS tablet Take 15 mg by mouth daily with supper.     No current facility-administered medications for this visit.   History   Social History  . Marital Status: Married    Spouse Name: N/A    Number of Children: 2  . Years of Education: N/A   Occupational History  .    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 Smoking status: Never Smoker   . Smokeless tobacco: Never Used  . Alcohol Use: 4.2 oz/week    7 Glasses of wine per week  . Drug Use: No  .  Sexual Activity:    Partners: Male   Other Topics Concern  . Not on file   Social History Narrative   Family History  Problem Relation Age of Onset  . Malignant hypertension Father   . Hypertension Father   . Renal Disease Father   . Breast cancer Mother   . Heart attack Brother   . Stroke Brother      ROS- all systems are reviewed and negative except as per HPI above  Physical Exam: BP BP 132/86, HR 91, wt 141.8  GEN- The patient is well appearing, alert and oriented x 3 today.   Head- normocephalic, atraumatic Eyes-  Sclera clear, conjunctiva pink Ears- hearing intact Oropharynx- clear Lungs- Clear to ausculation bilaterally, normal work of breathing Chest- pacemaker pocket is well healed Heart- irregular rate and rhythm,   GI- soft, NT, ND, + BS Extremities- no clubbing, cyanosis, +2 edema, rt worse than left  EKG- afib 91, LAD  Assessment and Plan:   1. Symptomatic Atrial fibrillation/ atrial flutter She has failed medical therapy with tikosyn and amiodarone.  Her atrial arrhythmias appear very difficult to control.  She has been in persistent Afib for months.  Therapeutic strategies for afib including medicine and AV nodal ablation were discussed in detail with the patient today. Risk, benefits, and alternatives to radiofrequency ablation of the AV node were also discussed in detail today. These risks include but are not limited to stroke, bleeding, vascular damage, tamponade, perforation, pacemaker lead dislodgement, and death. The patient understands these risk and wishes to proceed.  We will therefore proceed with AV nodal ablation at the next available time.  Given her advanced age, I would not recommend PVI for her.   2. Sick sinus syndrome She has reached ERI with generator change out needed.  Will plan AV nodal ablation at time of gen change. Risks of generator change were discussed with the patient who wishes to proceed.  3.  HTN Stable No change  required today

## 2014-04-27 NOTE — Progress Notes (Signed)
Patient presents for device clinic pacemaker check.  No problems with shortness of breath, chest pain, palpitations, or syncope.  Device interrogated and found to be at [facility].  No changes made today.  See PaceArt report for full details.  Plan generator change per JA.  Ranee Gosselin, RN, BSN 04/27/2014 10:35 AM

## 2014-04-29 DIAGNOSIS — I495 Sick sinus syndrome: Secondary | ICD-10-CM | POA: Insufficient documentation

## 2014-05-03 ENCOUNTER — Other Ambulatory Visit (INDEPENDENT_AMBULATORY_CARE_PROVIDER_SITE_OTHER): Payer: Medicare Other | Admitting: *Deleted

## 2014-05-03 DIAGNOSIS — Z01812 Encounter for preprocedural laboratory examination: Secondary | ICD-10-CM | POA: Diagnosis not present

## 2014-05-03 DIAGNOSIS — Z45018 Encounter for adjustment and management of other part of cardiac pacemaker: Secondary | ICD-10-CM

## 2014-05-03 DIAGNOSIS — I482 Chronic atrial fibrillation, unspecified: Secondary | ICD-10-CM

## 2014-05-03 LAB — BASIC METABOLIC PANEL
BUN: 29 mg/dL — ABNORMAL HIGH (ref 6–23)
CALCIUM: 9.7 mg/dL (ref 8.4–10.5)
CO2: 31 mEq/L (ref 19–32)
CREATININE: 1 mg/dL (ref 0.40–1.20)
Chloride: 103 mEq/L (ref 96–112)
GFR: 55.85 mL/min — ABNORMAL LOW (ref >60.00)
Glucose, Bld: 116 mg/dL — ABNORMAL HIGH (ref 70–99)
Potassium: 3.8 mEq/L (ref 3.5–5.1)
Sodium: 141 mEq/L (ref 135–145)

## 2014-05-03 LAB — CBC WITH DIFFERENTIAL/PLATELET
BASOS ABS: 0 10*3/uL (ref 0.0–0.1)
BASOS PCT: 0.6 % (ref 0.0–3.0)
Eosinophils Absolute: 0.2 10*3/uL (ref 0.0–0.7)
Eosinophils Relative: 2.4 % (ref 0.0–5.0)
HCT: 40.6 % (ref 36.0–46.0)
HEMOGLOBIN: 13.5 g/dL (ref 12.0–15.0)
Lymphocytes Relative: 18.2 % (ref 12.0–46.0)
Lymphs Abs: 1.2 10*3/uL (ref 0.7–4.0)
MCHC: 33.3 g/dL (ref 30.0–36.0)
MCV: 94.8 fl (ref 78.0–100.0)
MONOS PCT: 10.3 % (ref 3.0–12.0)
Monocytes Absolute: 0.7 10*3/uL (ref 0.1–1.0)
NEUTROS PCT: 68.5 % (ref 43.0–77.0)
Neutro Abs: 4.5 10*3/uL (ref 1.4–7.7)
Platelets: 275 10*3/uL (ref 150.0–400.0)
RBC: 4.28 Mil/uL (ref 3.87–5.11)
RDW: 14.4 % (ref 11.5–15.5)
WBC: 6.6 10*3/uL (ref 4.0–10.5)

## 2014-05-04 ENCOUNTER — Encounter (HOSPITAL_COMMUNITY): Payer: Self-pay | Admitting: Pharmacy Technician

## 2014-05-05 ENCOUNTER — Encounter: Payer: Self-pay | Admitting: Internal Medicine

## 2014-05-09 DIAGNOSIS — Z45010 Encounter for checking and testing of cardiac pacemaker pulse generator [battery]: Secondary | ICD-10-CM | POA: Diagnosis not present

## 2014-05-09 DIAGNOSIS — E780 Pure hypercholesterolemia: Secondary | ICD-10-CM | POA: Diagnosis not present

## 2014-05-09 DIAGNOSIS — J449 Chronic obstructive pulmonary disease, unspecified: Secondary | ICD-10-CM | POA: Diagnosis not present

## 2014-05-09 DIAGNOSIS — E039 Hypothyroidism, unspecified: Secondary | ICD-10-CM | POA: Diagnosis not present

## 2014-05-09 DIAGNOSIS — I495 Sick sinus syndrome: Secondary | ICD-10-CM | POA: Diagnosis not present

## 2014-05-09 DIAGNOSIS — H3530 Unspecified macular degeneration: Secondary | ICD-10-CM | POA: Diagnosis not present

## 2014-05-09 DIAGNOSIS — I129 Hypertensive chronic kidney disease with stage 1 through stage 4 chronic kidney disease, or unspecified chronic kidney disease: Secondary | ICD-10-CM | POA: Diagnosis not present

## 2014-05-09 DIAGNOSIS — I481 Persistent atrial fibrillation: Secondary | ICD-10-CM | POA: Diagnosis not present

## 2014-05-09 DIAGNOSIS — I4892 Unspecified atrial flutter: Secondary | ICD-10-CM | POA: Diagnosis not present

## 2014-05-09 DIAGNOSIS — M1990 Unspecified osteoarthritis, unspecified site: Secondary | ICD-10-CM | POA: Diagnosis not present

## 2014-05-09 DIAGNOSIS — Z880 Allergy status to penicillin: Secondary | ICD-10-CM | POA: Diagnosis not present

## 2014-05-09 DIAGNOSIS — E785 Hyperlipidemia, unspecified: Secondary | ICD-10-CM | POA: Diagnosis not present

## 2014-05-09 MED ORDER — VANCOMYCIN HCL IN DEXTROSE 1-5 GM/200ML-% IV SOLN
1000.0000 mg | INTRAVENOUS | Status: DC
  Filled 2014-05-09: qty 200

## 2014-05-09 MED ORDER — MUPIROCIN 2 % EX OINT
TOPICAL_OINTMENT | Freq: Two times a day (BID) | CUTANEOUS | Status: DC
Start: 2014-05-09 — End: 2014-05-10
  Administered 2014-05-10: 1 via NASAL
  Filled 2014-05-09: qty 22

## 2014-05-09 MED ORDER — SODIUM CHLORIDE 0.9 % IV SOLN
INTRAVENOUS | Status: DC
  Administered 2014-05-10: 11:00:00 via INTRAVENOUS

## 2014-05-09 MED ORDER — GENTAMICIN SULFATE 40 MG/ML IJ SOLN
80.0000 mg | INTRAMUSCULAR | Status: DC
  Filled 2014-05-09: qty 2

## 2014-05-10 ENCOUNTER — Encounter (HOSPITAL_COMMUNITY)
Admission: RE | Disposition: A | Discharge status: Home / Self Care | Payer: Self-pay | Source: Ambulatory Visit | Attending: Internal Medicine

## 2014-05-10 ENCOUNTER — Ambulatory Visit (HOSPITAL_COMMUNITY)
Admission: RE | Admit: 2014-05-10 | Discharge: 2014-05-11 | Disposition: A | Discharge status: Home / Self Care | Payer: 59 | Source: Ambulatory Visit | Attending: Internal Medicine | Admitting: Internal Medicine

## 2014-05-10 ENCOUNTER — Encounter (HOSPITAL_COMMUNITY): Payer: Self-pay | Admitting: General Practice

## 2014-05-10 ENCOUNTER — Other Ambulatory Visit: Payer: Self-pay | Admitting: Internal Medicine

## 2014-05-10 DIAGNOSIS — I4821 Permanent atrial fibrillation: Secondary | ICD-10-CM | POA: Diagnosis present

## 2014-05-10 DIAGNOSIS — Z45010 Encounter for checking and testing of cardiac pacemaker pulse generator [battery]: Secondary | ICD-10-CM | POA: Insufficient documentation

## 2014-05-10 DIAGNOSIS — H3530 Unspecified macular degeneration: Secondary | ICD-10-CM | POA: Insufficient documentation

## 2014-05-10 DIAGNOSIS — I481 Persistent atrial fibrillation: Secondary | ICD-10-CM | POA: Insufficient documentation

## 2014-05-10 DIAGNOSIS — I495 Sick sinus syndrome: Secondary | ICD-10-CM | POA: Insufficient documentation

## 2014-05-10 DIAGNOSIS — E780 Pure hypercholesterolemia: Secondary | ICD-10-CM | POA: Diagnosis not present

## 2014-05-10 DIAGNOSIS — Z950 Presence of cardiac pacemaker: Secondary | ICD-10-CM | POA: Diagnosis present

## 2014-05-10 DIAGNOSIS — E039 Hypothyroidism, unspecified: Secondary | ICD-10-CM | POA: Insufficient documentation

## 2014-05-10 DIAGNOSIS — E785 Hyperlipidemia, unspecified: Secondary | ICD-10-CM | POA: Insufficient documentation

## 2014-05-10 DIAGNOSIS — M1990 Unspecified osteoarthritis, unspecified site: Secondary | ICD-10-CM | POA: Insufficient documentation

## 2014-05-10 DIAGNOSIS — J449 Chronic obstructive pulmonary disease, unspecified: Secondary | ICD-10-CM | POA: Insufficient documentation

## 2014-05-10 DIAGNOSIS — I4892 Unspecified atrial flutter: Secondary | ICD-10-CM | POA: Insufficient documentation

## 2014-05-10 DIAGNOSIS — Z880 Allergy status to penicillin: Secondary | ICD-10-CM | POA: Insufficient documentation

## 2014-05-10 DIAGNOSIS — I4819 Other persistent atrial fibrillation: Secondary | ICD-10-CM

## 2014-05-10 DIAGNOSIS — I480 Paroxysmal atrial fibrillation: Secondary | ICD-10-CM | POA: Diagnosis not present

## 2014-05-10 DIAGNOSIS — I129 Hypertensive chronic kidney disease with stage 1 through stage 4 chronic kidney disease, or unspecified chronic kidney disease: Secondary | ICD-10-CM | POA: Insufficient documentation

## 2014-05-10 HISTORY — PX: AV NODE ABLATION: SHX1209

## 2014-05-10 HISTORY — PX: PERMANENT PACEMAKER GENERATOR CHANGE: SHX6022

## 2014-05-10 HISTORY — PX: AV NODE ABLATION: SHX5458

## 2014-05-10 HISTORY — PX: INSERT / REPLACE / REMOVE PACEMAKER: SUR710

## 2014-05-10 LAB — SURGICAL PCR SCREEN
MRSA, PCR: NEGATIVE
Staphylococcus aureus: NEGATIVE

## 2014-05-10 SURGERY — PERMANENT PACEMAKER GENERATOR CHANGE

## 2014-05-10 MED ORDER — LIDOCAINE HCL (PF) 1 % IJ SOLN
INTRAMUSCULAR | Status: AC
  Filled 2014-05-10: qty 60

## 2014-05-10 MED ORDER — LEVOTHYROXINE SODIUM 88 MCG PO TABS
88.0000 ug | ORAL_TABLET | Freq: Every day | ORAL | Status: DC
  Administered 2014-05-11: 88 ug via ORAL
  Filled 2014-05-10 (×2): qty 1

## 2014-05-10 MED ORDER — SODIUM CHLORIDE 0.9 % IV SOLN: 250.0000 mL | INTRAVENOUS | Status: DC | PRN

## 2014-05-10 MED ORDER — BUPIVACAINE HCL (PF) 0.25 % IJ SOLN
INTRAMUSCULAR | Status: AC
  Filled 2014-05-10: qty 30

## 2014-05-10 MED ORDER — MONTELUKAST SODIUM 10 MG PO TABS
10.0000 mg | ORAL_TABLET | Freq: Every day | ORAL | Status: DC
  Administered 2014-05-10: 10 mg via ORAL
  Filled 2014-05-10 (×2): qty 1

## 2014-05-10 MED ORDER — POTASSIUM CHLORIDE CRYS ER 20 MEQ PO TBCR
40.0000 meq | EXTENDED_RELEASE_TABLET | Freq: Every day | ORAL | Status: DC
  Administered 2014-05-11: 40 meq via ORAL
  Filled 2014-05-10: qty 2

## 2014-05-10 MED ORDER — METOPROLOL SUCCINATE ER 100 MG PO TB24
100.0000 mg | ORAL_TABLET | Freq: Every day | ORAL | Status: DC
  Administered 2014-05-10 – 2014-05-11 (×2): 100 mg via ORAL
  Filled 2014-05-10 (×3): qty 1

## 2014-05-10 MED ORDER — HYDROCODONE-ACETAMINOPHEN 5-325 MG PO TABS: 1.0000 | ORAL_TABLET | ORAL | Status: DC | PRN

## 2014-05-10 MED ORDER — MIDAZOLAM HCL 5 MG/5ML IJ SOLN
INTRAMUSCULAR | Status: AC
  Filled 2014-05-10: qty 5

## 2014-05-10 MED ORDER — MOMETASONE FURO-FORMOTEROL FUM 100-5 MCG/ACT IN AERO
2.0000 | INHALATION_SPRAY | Freq: Two times a day (BID) | RESPIRATORY_TRACT | Status: DC
  Administered 2014-05-10 – 2014-05-11 (×2): 2 via RESPIRATORY_TRACT
  Filled 2014-05-10: qty 8.8

## 2014-05-10 MED ORDER — ONDANSETRON HCL 4 MG/2ML IJ SOLN: 4.0000 mg | Freq: Four times a day (QID) | INTRAMUSCULAR | Status: DC | PRN

## 2014-05-10 MED ORDER — ACETAMINOPHEN 325 MG PO TABS
650.0000 mg | ORAL_TABLET | ORAL | Status: DC | PRN
  Administered 2014-05-11 (×2): 650 mg via ORAL
  Filled 2014-05-10 (×2): qty 2

## 2014-05-10 MED ORDER — SODIUM CHLORIDE 0.9 % IJ SOLN: 3.0000 mL | Freq: Two times a day (BID) | INTRAMUSCULAR | Status: DC

## 2014-05-10 MED ORDER — MUPIROCIN 2 % EX OINT
TOPICAL_OINTMENT | CUTANEOUS | Status: AC
  Administered 2014-05-10: 1 via NASAL
  Filled 2014-05-10: qty 22

## 2014-05-10 MED ORDER — ACETAMINOPHEN 325 MG PO TABS: 325.0000 mg | ORAL_TABLET | ORAL | Status: DC | PRN

## 2014-05-10 MED ORDER — LUTEIN 20 MG PO CAPS: 1.0000 | ORAL_CAPSULE | Freq: Every day | ORAL | Status: DC

## 2014-05-10 MED ORDER — FUROSEMIDE 40 MG PO TABS
40.0000 mg | ORAL_TABLET | ORAL | Status: DC
  Filled 2014-05-10: qty 1

## 2014-05-10 MED ORDER — SODIUM CHLORIDE 0.9 % IJ SOLN: 3.0000 mL | INTRAMUSCULAR | Status: DC | PRN

## 2014-05-10 MED ORDER — FUROSEMIDE 40 MG PO TABS
40.0000 mg | ORAL_TABLET | Freq: Every morning | ORAL | Status: DC
  Filled 2014-05-10: qty 1

## 2014-05-10 MED ORDER — CHLORHEXIDINE GLUCONATE 4 % EX LIQD
60.0000 mL | Freq: Once | CUTANEOUS | Status: DC
  Filled 2014-05-10: qty 60

## 2014-05-10 MED ORDER — FENTANYL CITRATE 0.05 MG/ML IJ SOLN
INTRAMUSCULAR | Status: AC
  Filled 2014-05-10: qty 2

## 2014-05-10 MED ORDER — MAGNESIUM OXIDE 400 (241.3 MG) MG PO TABS
200.0000 mg | ORAL_TABLET | Freq: Every day | ORAL | Status: DC
  Administered 2014-05-10 – 2014-05-11 (×2): 200 mg via ORAL
  Filled 2014-05-10 (×2): qty 0.5

## 2014-05-10 MED ORDER — MAGNESIUM 250 MG PO TABS: 1.0000 | ORAL_TABLET | Freq: Every day | ORAL | Status: DC

## 2014-05-10 MED ORDER — ALBUTEROL SULFATE (2.5 MG/3ML) 0.083% IN NEBU: 2.5000 mg | INHALATION_SOLUTION | Freq: Four times a day (QID) | RESPIRATORY_TRACT | Status: DC | PRN

## 2014-05-10 NOTE — Op Note (Addendum)
SURGEON:  Thompson Grayer, MD     PREPROCEDURE DIAGNOSES:   1. Sick sinus syndrome.   2. Persistent Atrial fibrillation with RVR (tach/brady syndrome).   3. Elective Replacement Indicator Status    POSTPROCEDURE DIAGNOSES:   1. Sick sinus syndrome.   2. Persistent Atrial fibrillation with RVR (tach/brady syndrome).   3. Elective Replacement Indicator Status    PROCEDURES:   1. Pacemaker pulse generator replacement.   2. Skin pocket revision.   3. His Bundle recording and pacing  4. AV nodal ablation     INTRODUCTION:  Jody Blake is a 81 y.o. female with a history of sick sinus syndrome. The patient has done well since his pacemaker was implanted.  She has developed refractory afib with RVR.  She has failed rhythm control and has also not been able to be adequately rate controlled.  The patient  has recently reached ERI battery status.  The patient presents today for pacemaker pulse generator replacement with AV nodal ablation.       DESCRIPTION OF THE PROCEDURE (Pacemaker Gen Change):  Informed written consent was obtained, and the patient was brought to the electrophysiology lab in the fasting state.  The patient's pacemaker was interrogated today and found to be at elective replacement indicator battery status.  The patient was adequately sedated with intravenous Versed as outlined in the nursing report. The patient's left chest was prepped and draped in the usual sterile fashion by the EP lab staff.  The skin overlying the existing pacemaker was infiltrated with lidocaine for local analgesia.  A 4-cm incision was made over the pacemaker pocket.  Using a combination of sharp and blunt dissection, the pacemaker was exposed and removed from the body.  The device was disconnected from the leads. There was no foreign matter or debris within the pocket.  The atrial lead was confirmed to be a Medtronic model [id number] (serial number [id number]) lead implanted on 07/30/1999.  The right ventricular  lead was confirmed to be a Medtronic model [id number] (serial number [id number]) lead implanted on the same date as the atrial lead (above).  Both leads were examined and their integrity was confirmed to be intact.  Atrial lead fib-waves measured 0.8 mV with impedance of 407 ohms.  Right ventricular lead R-waves measured 6.8 mV with impedance of 540 ohms and a threshold of 1.0 V at 0.5 msec.  Both leads were connected to a Medtronic adapt L model ADDDR 1 (serial number [id number]) pacemaker.  The pocket was revised to accommodate this new device.  Electrocautery was required to assure hemostasis.  The pocket was irrigated with copious gentamicin solution. The pacemaker was then placed into the pocket.  The pocket was then closed in 2 layers with 2-0 Vicryl suture over the subcutaneous and subcuticular layers.  Steri-Strips and a sterile dressing were then applied.EBL<81ml. There were no early apparent complications.   DESCRIPTION OF PROCEDURE (AV nodal ablation):   The patient's right groin was prepped and draped in the usual sterile fashion by the EP Lab staff. Using a percutaneous Seldinger technique, a 8 F hemostasis sheath was placed into the right common femoral vein.  A 7 French Biosense Webster Celsius catheter was introduced through the right common femoral vein and advanced into the His bundle location for recording and pacing.   The patient presented to the Electrophysiology Lab in atrial fibrillation with RVR. The QRS was 65 msec. The average RR interval was 492 msec. The HV interval was 57 milliseconds.  A total of 2 RF lesions were required to achieve AV block with RV delivered at 50 watts/ 60 degrees.  During the second RF lesion, accelerated junctional rhythm was observed with subsequent AV block.  The there was a narrow complex escape (QRS 58 msec) with an average intrinsic RR interval of 682 msec.  The patient was observed for 30 minutes without return of AV conduction.  The pacemaker was  programmed VVIR at 80 bpm.  The procedure was therefore considered completed.  All catheters were removed and the sheaths were aspirated and flushed.  The sheaths were then removed using standard technique.  There were no early apparent complications.     CONCLUSIONS:   1. Successful pacemaker pulse generator replacement for elective replacement indicator battery status and sick sinus syndrome.  2. Successful AV nodal ablation  3. No early apparent complications.     Thompson Grayer, MD 05/10/2014 3:00 PM  Due to premature battery depletion related to her Medtronic EnRhythm pacemaker, there may be a partial warranty.  This will be further addressed between Medtr[employer] and [facility] Cath lab administration.

## 2014-05-10 NOTE — Progress Notes (Signed)
Site area: RFV Site Prior to Removal:  Level 0 Pressure Applied For:38min Manual:   yes Patient Status During Pull:  stable Post Pull Site:  Level 0 Post Pull Instructions Given:  yes Post Pull Pulses Present: palpable Dressing Applied:  clear Bedrest begins @  Comments:

## 2014-05-10 NOTE — Discharge Summary (Signed)
ELECTROPHYSIOLOGY PROCEDURE DISCHARGE SUMMARY    Patient ID: Jody Blake,  MRN: [id number], DOB/AGE: 07/05/1925 81 y.o.  Admit date: 05/10/2014 Discharge date: 05/11/2014  Primary Care Physician: Mathews Argyle, MD Primary Cardiologist: [facility] Electrophysiologist: Benjamin Casanas  Primary Discharge Diagnosis:  Symptomatic bradycardia, persistent AF with RVR, status post pacemaker generator change and AV node ablation this admission  Secondary Discharge Diagnosis:  1.  COPD 2.  Hypertension 3.  Hyperlipidemia 4.  Macular degeneration 5.  Hypothyroidism   Allergies  Allergen Reactions  . Brimonidine Tartrate-Timolol Other (See Comments)    REACTION: systemic malaise amigen eye drop  . Penicillins Hives     Procedures This Admission:  1.  Pacemaker generator change on 05/10/14 by Dr [name].  The patient's previously implanted pacemaker generator was replaced with a MDT ADDRL1 pacemaker.  There were no early apparent complications.  2.  AV node ablation on 05/10/14 by Dr [name].  This demonstrated successful AV node ablation with residual narrow complex escape.  There were no early apparent complications.   Brief HPI: Jody Blake is a 81 y.o. female with a past medical history as outlined above.  She has a previously implanted pacemaker now at [facility] with sick sinus syndrome and has developed persistent symptomatic atrial fibrillation with RVR.  Medication titration was limited due to side effects.  She had previously failed medical therapy with tikosyn and amiodarone.  Risks, benefits, and alternatives to pacemaker generator change and AV node ablation were discussed with the patient who wished to proceed.  Hospital Course:  The patient was admitted and underwent pacemaker generator change and AV node ablation with details as outlined above.  She was monitored on telemetry overnight which demonstrated atrial fibrillation with V pacing at 80bpm.  Her left chest and groin incisions  were without complication.  She was evaluated by Dr [name] and considered stable for discharge to home. She will be seen for wound check in 10 days at which time her lower rate should be decreased to 70bpm.  She will be seen by Roderic Palau, NP in the AF clinic in 4 weeks and Dr [name] in 3 months.   Discharge Vitals: Blood pressure 153/88, pulse 80, temperature 97.2 F (36.2 C), temperature source Oral, resp. rate 18, height 5\' 3"  (1.6 m), weight 137 lb 12.6 oz (62.5 kg), last menstrual period 04/08/1974, SpO2 98 %.   Physical Exam: Filed Vitals:   05/11/14 0453 05/11/14 0543 05/11/14 0927 05/11/14 0954  BP:  141/91  153/88  Pulse:  80  80  Temp:  97.2 F (36.2 C)    TempSrc:  Oral    Resp:  18    Height:      Weight: 137 lb 12.6 oz (62.5 kg)     SpO2:  94% 97% 98%    GEN- The patient is well appearing, alert and oriented x 3 today.   Head- normocephalic, atraumatic Eyes-  Sclera clear, conjunctiva pink Ears- hearing intact Oropharynx- clear Neck- supple, Lungs- Clear to ausculation bilaterally, normal work of breathing Heart- Regular rate and rhythm (paced) GI- soft, NT, ND, + BS Extremities- no clubbing, cyanosis, or edema, groin is without hematoma/ bruit MS- no significant deformity or atrophy Skin- pacemaker site with ecchymosis but no hematoma Psych- euthymic mood, full affect Neuro- strength and sensation are intact   Labs:   Lab Results  Component Value Date   WBC 6.6 05/03/2014   HGB 13.5 05/03/2014   HCT 40.6 05/03/2014   MCV  94.8 05/03/2014   PLT 275.0 05/03/2014     Discharge Medications:    Medication List    STOP taking these medications        diltiazem 240 MG 24 hr capsule  Commonly known as:  CARDIZEM CD     diltiazem 30 MG tablet  Commonly known as:  CARDIZEM      TAKE these medications        albuterol 108 (90 BASE) MCG/ACT inhaler  Commonly known as:  PROVENTIL HFA;VENTOLIN HFA  Inhale 1-2 puffs into the lungs every 6 (six) hours as  needed for wheezing or shortness of breath.     cholecalciferol 1000 UNITS tablet  Commonly known as:  VITAMIN D  Take 2,000 Units by mouth daily.     fluticasone 50 MCG/ACT nasal spray  Commonly known as:  FLONASE  Place 2 sprays into the nose daily as needed for rhinitis.     furosemide 40 MG tablet  Commonly known as:  LASIX  Take 40 mg by mouth See admin instructions. Take 1 tablet every morning, and 1 every other evening     levothyroxine 88 MCG tablet  Commonly known as:  SYNTHROID, LEVOTHROID  Take 88 mcg by mouth daily before breakfast.     Lutein 20 MG Caps  Take 1 capsule by mouth daily.     Magnesium 250 MG Tabs  Take 1 tablet by mouth daily.     metoprolol succinate 100 MG 24 hr tablet  Commonly known as:  TOPROL-XL  Take 1 tablet (100 mg total) by mouth daily. Take with or immediately following a meal.     mometasone-formoterol 100-5 MCG/ACT Aero  Commonly known as:  DULERA  Inhale 2 puffs into the lungs 2 (two) times daily.     montelukast 10 MG tablet  Commonly known as:  SINGULAIR  Take 10 mg by mouth at bedtime.     multivitamin tablet  Take 1 tablet by mouth daily.     potassium chloride SA 20 MEQ tablet  Commonly known as:  K-DUR,KLOR-CON  TAKE 2 TABLETS DAILY.     Rivaroxaban 15 MG Tabs tablet  Commonly known as:  XARELTO  Take 1 tablet (15 mg total) by mouth daily with supper. HOLD for 2 days, resume on Friday, 05/13/2014        Disposition:  Discharge Instructions    Diet - low sodium heart healthy    Complete by:  As directed      Increase activity slowly    Complete by:  As directed           Follow-up Information    Follow up with [facility] On 05/23/2014.   Why:  Wound check and device check at 2:30 pm   Contact information:   [address] [place]  [phone]       Duration of Discharge Encounter: Greater than 30 minutes including physician time.  Signed,  Thompson Grayer MD

## 2014-05-10 NOTE — Progress Notes (Signed)
PHARMACIST - PHYSICIAN ORDER COMMUNICATION  CONCERNING: P&T Medication Policy on Herbal Medications  DESCRIPTION:  This patient's order for:  Lutein  has been noted.  This product(s) is classified as an "herbal" or natural product. Due to a lack of definitive safety studies or FDA approval, nonstandard manufacturing practices, plus the potential risk of unknown drug-drug interactions while on inpatient medications, the Pharmacy and Therapeutics Committee does not permit the use of "herbal" or natural products of this type within [facility].   ACTION TAKEN: The pharmacy department is unable to verify this order at this time and your patient has been informed of this safety policy. Please reevaluate patient's clinical condition at discharge and address if the herbal or natural product(s) should be resumed at that time.  Elicia Lamp, PharmD Clinical Pharmacist - Resident Pager [phone] 05/10/2014 5:31 PM

## 2014-05-10 NOTE — H&P (View-Only) (Signed)
PCP: Mathews Argyle, MD Primary Cardiologist: Cristy Folks Jody Blake is a 81 y.o. female who presents today for routine electrophysiology followup for permanent afib. Continues on NOAC with good tolerance. She is still symptomatic with exertional dyspnea and pedal edema, possibly worse over the last few months. Cardizem thought to be contributing to pedal edema but currently needs for rate control. She did discuss with Dr. [name] re AV nodal ablation on last visit so she would be less symptomatic and could decrease some rate control meds. PPM interrogation today shows ERI. Dr. [name] in and discussed with pt generator change out with an AV nodal ablation as previously discussed, but pt wanted to avoid. She is now willing to pursue due to increase of symptoms.  Stressed that the pt would be pacer dependent. She is aware and wants to proceed..     Today, she denies symptoms of chest pain,dizziness, presyncope, or syncope. Positive as above for exertional dyspnea and pedal edema. The patient is otherwise without complaint today.   Past Medical History  Diagnosis Date  . Obstructive chronic bronchitis with exacerbation     Dr. [name]  . Rhinitis   . Hypertension   . Diverticulosis   . Asthmatic bronchitis   . Pure hypercholesterolemia   . Macular degeneration     Dr. [name]  . Aortic stenosis     a. Echo 09/06/12 EF 55-60%, no WMA, G2DD, Ao valve sclerosis w/ mod stenosis, LA mildly dilated, PA pressure 52mmHg  . Unspecified constipation   . Encounter for long-term (current) use of other medications   . COPD (chronic obstructive pulmonary disease)     Dr. [name]  . PAF (paroxysmal atrial fibrillation)     Stopped flecainide, on amiodarone but still has bouts of A FIB (mostly in mornings).   . Tachy-brady syndrome     s/p permanent pacemaker 06/27/1999 (Battery change 06/2007)  . Hypothyroidism     on medication  . Anemia   . DVT (deep vein thrombosis) in pregnancy 1954    a. LLE    . Pacemaker     a. Medtronic SN: [id number]  . Heart murmur   . Diastolic dysfunction     a. Echo 09/06/12 EF 55-60%, no WMA, G2DD, Ao valve sclerosis w/ mod stenosis, LA mildly dilated, PA pressure 84mmHg  . Chronic bronchitis   . Osteoarthrosis, unspecified whether generalized or localized, other specified sites   . Arthritis     "probably in my back" (12/01/2013)  . DDD (degenerative disc disease)   . Chronic lower back pain   . Staghorn calculus     Left  . Benign hypertensive kidney disease with chronic kidney disease stage I through stage IV, or unspecified    Past Surgical History  Procedure Laterality Date  . Partial nephrectomy Left 05/1974    stone disease  . Bunionectomy with hammertoe reconstruction Bilateral ~ 1990  . Total knee arthroplasty Right 2001  . Incisional hernia repair    . Cataract extraction w/ intraocular lens  implant, bilateral Bilateral   . Hernia repair    . Esophagogastroduodenoscopy (egd) with propofol N/A 04/22/2013    Procedure: ESOPHAGOGASTRODUODENOSCOPY (EGD) WITH PROPOFOL;  Surgeon: [name], MD;  Location: [facility] ENDOSCOPY;  Service: Endoscopy;  Laterality: N/A;  . Colonoscopy with propofol N/A 04/22/2013    Procedure: COLONOSCOPY WITH PROPOFOL;  Surgeon: [name], MD;  Location: [facility] ENDOSCOPY;  Service: Endoscopy;  Laterality: N/A;  . Tonsillectomy and adenoidectomy  1930's  .  Appendectomy  ~ 1941  . Cardiac pacemaker placement  06/27/99    Medtronic PM implanted by Dr [name]  . Insert / replace / remove pacemaker  06/2007    "took out the old; put in new"  . Joint replacement    . Dilation and curettage of uterus  X 2    "when I was going thru menopause"  . Cardioversion N/A 12/02/2013    Procedure: CARDIOVERSION;  Surgeon: [name], MD;  Location: [facility] ENDOSCOPY;  Service: Cardiovascular;  Laterality: N/A;    Current Outpatient Prescriptions  Medication Sig Dispense Refill  . albuterol (PROVENTIL HFA;VENTOLIN HFA) 108 (90  BASE) MCG/ACT inhaler Inhale 1-2 puffs into the lungs every 6 (six) hours as needed for wheezing or shortness of breath.    . cholecalciferol (VITAMIN D) 1000 UNITS tablet Take 2,000 Units by mouth daily.    [name] diltiazem (CARDIZEM CD) 240 MG 24 hr capsule Take 240 mg by mouth 2 (two) times daily.    [name] diltiazem (CARDIZEM) 30 MG tablet Take 30 mg by mouth daily as needed (for hearbeat irregularity).    . fluticasone (FLONASE) 50 MCG/ACT nasal spray Place 2 sprays into the nose daily as needed for rhinitis.    . furosemide (LASIX) 40 MG tablet Take 2 tablets daily    . levothyroxine (SYNTHROID, LEVOTHROID) 88 MCG tablet Take 88 mcg by mouth daily before breakfast.    . Lutein 20 MG CAPS Take 1 capsule by mouth daily.     . Magnesium 250 MG TABS Take 1 tablet by mouth daily.    . metoprolol succinate (TOPROL-XL) 100 MG 24 hr tablet Take 1 tablet (100 mg total) by mouth 2 (two) times daily. Take with or immediately following a meal. 90 tablet 3  . mometasone-formoterol (DULERA) 100-5 MCG/ACT AERO Inhale 2 puffs into the lungs 2 (two) times daily.    . montelukast (SINGULAIR) 10 MG tablet Take 10 mg by mouth at bedtime.    . Multiple Vitamin (MULTIVITAMIN) tablet Take 1 tablet by mouth daily.     . Multiple Vitamins-Minerals (PRESERVISION AREDS PO) Take 1 tablet by mouth 2 (two) times daily.     . potassium chloride SA (K-DUR,KLOR-CON) 20 MEQ tablet TAKE 2 TABLETS DAILY. 60 tablet 6  . Rivaroxaban (XARELTO) 15 MG TABS tablet Take 15 mg by mouth daily with supper.     No current facility-administered medications for this visit.   History   Social History  . Marital Status: Married    Spouse Name: N/A    Number of Children: 2  . Years of Education: N/A   Occupational History  .    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 Smoking status: Never Smoker   . Smokeless tobacco: Never Used  . Alcohol Use: 4.2 oz/week    7 Glasses of wine per week  . Drug Use: No  .  Sexual Activity:    Partners: Male   Other Topics Concern  . Not on file   Social History Narrative   Family History  Problem Relation Age of Onset  . Malignant hypertension Father   . Hypertension Father   . Renal Disease Father   . Breast cancer Mother   . Heart attack Brother   . Stroke Brother      ROS- all systems are reviewed and negative except as per HPI above  Physical Exam: BP BP 132/86, HR 91, wt 141.8  GEN- The patient is well appearing, alert and oriented x 3 today.   Head- normocephalic, atraumatic Eyes-  Sclera clear, conjunctiva pink Ears- hearing intact Oropharynx- clear Lungs- Clear to ausculation bilaterally, normal work of breathing Chest- pacemaker pocket is well healed Heart- irregular rate and rhythm,   GI- soft, NT, ND, + BS Extremities- no clubbing, cyanosis, +2 edema, rt worse than left  EKG- afib 91, LAD  Assessment and Plan:   1. Symptomatic Atrial fibrillation/ atrial flutter She has failed medical therapy with tikosyn and amiodarone.  Her atrial arrhythmias appear very difficult to control.  She has been in persistent Afib for months.  Therapeutic strategies for afib including medicine and AV nodal ablation were discussed in detail with the patient today. Risk, benefits, and alternatives to radiofrequency ablation of the AV node were also discussed in detail today. These risks include but are not limited to stroke, bleeding, vascular damage, tamponade, perforation, pacemaker lead dislodgement, and death. The patient understands these risk and wishes to proceed.  We will therefore proceed with AV nodal ablation at the next available time.  Given her advanced age, I would not recommend PVI for her.   2. Sick sinus syndrome She has reached ERI with generator change out needed.  Will plan AV nodal ablation at time of gen change. Risks of generator change were discussed with the patient who wishes to proceed.  3.  HTN Stable No change  required today

## 2014-05-10 NOTE — Interval H&P Note (Signed)
History and Physical Interval Note:  05/10/2014 11:51 AM  Jody Blake  has presented today for surgery, with the diagnosis of eri/afib  The various methods of treatment have been discussed with the patient and family. After consideration of risks, benefits and other options for treatment, the patient has consented to  Procedure(s): PERMANENT PACEMAKER GENERATOR CHANGE (N/A) AV NODE ABLATION (N/A) as a surgical intervention .  The patient's history has been reviewed, patient examined, no change in status, stable for surgery.  I have reviewed the patient's chart and labs.  Questions were answered to the patient's satisfaction.     Thompson Grayer

## 2014-05-11 ENCOUNTER — Other Ambulatory Visit: Payer: Self-pay

## 2014-05-11 DIAGNOSIS — I481 Persistent atrial fibrillation: Secondary | ICD-10-CM

## 2014-05-11 DIAGNOSIS — Z45010 Encounter for checking and testing of cardiac pacemaker pulse generator [battery]: Secondary | ICD-10-CM | POA: Diagnosis not present

## 2014-05-11 DIAGNOSIS — I495 Sick sinus syndrome: Secondary | ICD-10-CM | POA: Diagnosis not present

## 2014-05-11 DIAGNOSIS — J449 Chronic obstructive pulmonary disease, unspecified: Secondary | ICD-10-CM | POA: Diagnosis not present

## 2014-05-11 DIAGNOSIS — E780 Pure hypercholesterolemia: Secondary | ICD-10-CM | POA: Diagnosis not present

## 2014-05-11 DIAGNOSIS — H3530 Unspecified macular degeneration: Secondary | ICD-10-CM | POA: Diagnosis not present

## 2014-05-11 MED ORDER — RIVAROXABAN 15 MG PO TABS: 15.0000 mg | ORAL_TABLET | Freq: Every day | ORAL | Status: DC

## 2014-05-11 MED ORDER — METOPROLOL SUCCINATE ER 100 MG PO TB24: 100.0000 mg | ORAL_TABLET | Freq: Every day | ORAL | Status: DC

## 2014-05-11 NOTE — Clinical Social Work Note (Addendum)
CSW received consult that patient is from [facility] independent [facility]- no CSW involvement is needed for patient return to independent living community.  CSW signing off.  Domenica Reamer, [place] Social Worker [phone]

## 2014-05-11 NOTE — Progress Notes (Signed)
Patient ambulated in hallway with assistance, gait mostly steady, sl unsteady x1. Denied need for walker, states so much better than last night. " Ready to go home "  Jody Blake

## 2014-05-11 NOTE — Discharge Instructions (Signed)
° °  Supplemental Discharge Instructions for  Pacemaker/Defibrillator Patients  Activity No heavy lifting or vigorous activity with your left/right arm for 6 to 8 weeks.  Do not raise your left/right arm above your head for one week.  Gradually raise your affected arm as drawn below.                       02/06                   02/07                    02/08                    02/09            NO DRIVING till cleared by MD. Santa Claus the wound area clean and dry.  Do not get this area wet for one week. No showers for one week; you may shower on      02/10        . - The tape/steri-strips on your wound will fall off; do not pull them off.  No bandage is needed on the site.  DO  NOT apply any creams, oils, or ointments to the wound area. - If you notice any drainage or discharge from the wound, any swelling or bruising at the site, or you develop a fever > 101? F after you are discharged home, call the office at once.  Special Instructions - You are still able to use cellular telephones; use the ear opposite the side where you have your pacemaker/defibrillator.  Avoid carrying your cellular phone near your device. - When traveling through airports, show security personnel your identification card to avoid being screened in the metal detectors.  Ask the security personnel to use the hand wand. - Avoid arc welding equipment, MRI testing (magnetic resonance imaging), TENS units (transcutaneous nerve stimulators).  Call the office for questions about other devices. - Avoid electrical appliances that are in poor condition or are not properly grounded. - Microwave ovens are safe to be near or to operate.  Additional information for defibrillator patients should your device go off: - If your device goes off ONCE and you feel fine afterward, notify the device clinic nurses. - If your device goes off ONCE and you do not feel well afterward, call 911. - If your device goes off TWICE, call  911. - If your device goes off THREE times in one day, call 911.  DO NOT DRIVE YOURSELF OR A FAMILY MEMBER WITH A DEFIBRILLATOR TO THE HOSPITAL--CALL 911.

## 2014-05-11 NOTE — Progress Notes (Signed)
Reviewed dc instructions with patient and daughters, voiced understanding by teachback  given paper work. DC home with instructions Alfonzo Feller

## 2014-05-16 ENCOUNTER — Other Ambulatory Visit: Payer: Self-pay

## 2014-05-23 ENCOUNTER — Ambulatory Visit (INDEPENDENT_AMBULATORY_CARE_PROVIDER_SITE_OTHER): Payer: Medicare Other | Admitting: *Deleted

## 2014-05-23 DIAGNOSIS — I482 Chronic atrial fibrillation, unspecified: Secondary | ICD-10-CM

## 2014-05-23 DIAGNOSIS — I495 Sick sinus syndrome: Secondary | ICD-10-CM

## 2014-05-23 DIAGNOSIS — Z950 Presence of cardiac pacemaker: Secondary | ICD-10-CM

## 2014-05-23 LAB — MDC_IDC_ENUM_SESS_TYPE_INCLINIC
Battery Voltage: 2.8 V
Lead Channel Impedance Value: 691 Ohm
Lead Channel Pacing Threshold Pulse Width: 0.4 ms
Lead Channel Setting Pacing Amplitude: 2.75 V
Lead Channel Setting Pacing Pulse Width: 0.4 ms
Lead Channel Setting Sensing Sensitivity: 4 mV
MDC IDC MSMT BATTERY IMPEDANCE: 100 Ohm
MDC IDC MSMT BATTERY REMAINING LONGEVITY: 140 mo
MDC IDC MSMT LEADCHNL RA IMPEDANCE VALUE: 67 Ohm
MDC IDC MSMT LEADCHNL RV PACING THRESHOLD AMPLITUDE: 1 V
MDC IDC SESS DTM: 20160215145835
MDC IDC STAT BRADY RV PERCENT PACED: 99 %

## 2014-05-23 MED ORDER — METOPROLOL SUCCINATE ER 50 MG PO TB24: 50.0000 mg | ORAL_TABLET | Freq: Every day | ORAL | Status: DC

## 2014-05-23 MED ORDER — HYDROCORTISONE 1 % EX OINT: 1.0000 "application " | TOPICAL_OINTMENT | Freq: Four times a day (QID) | CUTANEOUS | Status: DC | PRN

## 2014-05-23 NOTE — Progress Notes (Signed)
Changeout wound check appointment. Steri-strips removed. Wound without redness or edema. Incision edges approximated, wound well healed. Normal device function. Threshold, sensing, and impedance consistent with implant measurements. Device programmed at chronic output settings---changed output from 2.75 to 2.5V. Histogram distribution appropriate for patient and level of activity. No high ventricular rates noted. Changed lower rate from 80 to 70bpm. Patient educated about wound care, arm mobility, lifting restrictions. ROV w/ Dr. [name] 06/22/14 for AV node ablation protocol.

## 2014-05-24 DIAGNOSIS — I481 Persistent atrial fibrillation: Secondary | ICD-10-CM | POA: Diagnosis not present

## 2014-05-24 DIAGNOSIS — I10 Essential (primary) hypertension: Secondary | ICD-10-CM | POA: Diagnosis not present

## 2014-05-25 ENCOUNTER — Telehealth: Payer: Self-pay | Admitting: *Deleted

## 2014-05-25 NOTE — Telephone Encounter (Signed)
Pt concerned her lower rate is not down to 70. She uses BP cuff and iPhone app for her pulse which reads 80. She also states her visit w/ Dr. Felipa Eth shows rate of 80. I reviewed final printed interrogation report to verify her lower rate is 70.   Pt will send a manual remote to verify accurate programming. I will contact her later today to update her.

## 2014-05-25 NOTE — Telephone Encounter (Signed)
Remote was received. Lower rate is set to 70bpm.   Called, updated pt. She stated she measured her pulse again--she was able to see a rate near 70bpm.

## 2014-05-27 ENCOUNTER — Encounter: Payer: Self-pay | Admitting: Internal Medicine

## 2014-06-22 ENCOUNTER — Encounter: Payer: Self-pay | Admitting: Internal Medicine

## 2014-06-22 ENCOUNTER — Ambulatory Visit (INDEPENDENT_AMBULATORY_CARE_PROVIDER_SITE_OTHER): Payer: Medicare Other | Admitting: Internal Medicine

## 2014-06-22 ENCOUNTER — Other Ambulatory Visit: Payer: Self-pay

## 2014-06-22 VITALS — BP 132/84 | HR 91 | Ht 63.0 in | Wt 136.0 lb

## 2014-06-22 DIAGNOSIS — I482 Chronic atrial fibrillation, unspecified: Secondary | ICD-10-CM

## 2014-06-22 DIAGNOSIS — Z950 Presence of cardiac pacemaker: Secondary | ICD-10-CM | POA: Diagnosis not present

## 2014-06-22 DIAGNOSIS — I10 Essential (primary) hypertension: Secondary | ICD-10-CM

## 2014-06-22 DIAGNOSIS — I481 Persistent atrial fibrillation: Secondary | ICD-10-CM

## 2014-06-22 DIAGNOSIS — I442 Atrioventricular block, complete: Secondary | ICD-10-CM | POA: Insufficient documentation

## 2014-06-22 DIAGNOSIS — I4819 Other persistent atrial fibrillation: Secondary | ICD-10-CM

## 2014-06-22 LAB — MDC_IDC_ENUM_SESS_TYPE_INCLINIC
Battery Remaining Longevity: 142 mo
Date Time Interrogation Session: 20160316115650
Lead Channel Pacing Threshold Pulse Width: 0.4 ms
Lead Channel Setting Pacing Pulse Width: 0.4 ms
Lead Channel Setting Sensing Sensitivity: 4 mV
MDC IDC MSMT BATTERY IMPEDANCE: 100 Ohm
MDC IDC MSMT BATTERY VOLTAGE: 2.8 V
MDC IDC MSMT LEADCHNL RA IMPEDANCE VALUE: 67 Ohm
MDC IDC MSMT LEADCHNL RV IMPEDANCE VALUE: 676 Ohm
MDC IDC MSMT LEADCHNL RV PACING THRESHOLD AMPLITUDE: 1 V
MDC IDC SET LEADCHNL RV PACING AMPLITUDE: 2.5 V
MDC IDC STAT BRADY RV PERCENT PACED: 100 %

## 2014-06-22 MED ORDER — METOPROLOL SUCCINATE ER 50 MG PO TB24: ORAL_TABLET | ORAL | Status: DC

## 2014-06-22 NOTE — Patient Instructions (Signed)
Your physician recommends that you schedule a follow-up appointment as scheduled  Your physician has recommended you make the following change in your medication:  1) Decrease Metoprolol to 25 mg daily

## 2014-06-22 NOTE — Progress Notes (Signed)
Electrophysiology Office Note   Date:  06/22/2014   ID:  Jody Blake, DOB 08-27-1925, MRN [id number]  PCP:  Mathews Argyle, MD  Cardiologist:  Dr Marlou Porch Primary Electrophysiologist: Thompson Grayer, MD    Chief Complaint  Patient presents with  . Follow-up    Persistent AFIB     History of Present Illness: Jody Blake is a 81 y.o. female who presents today for electrophysiology evaluation.   She is doing very well s/p AV nodal ablation.  Her SOB is resolved.  Her edema is "much better".  Today, she denies symptoms of palpitations, chest pain, orthopnea, PND,  claudication, dizziness, presyncope, syncope, bleeding, or neurologic sequela. The patient is tolerating medications without difficulties and is otherwise without complaint today.    Past Medical History  Diagnosis Date  . Hypertension   . Diverticulosis   . Asthmatic bronchitis   . Pure hypercholesterolemia   . Macular degeneration     Dr. [name]  . Aortic stenosis     a. Echo 09/06/12 EF 55-60%, no WMA, G2DD, Ao valve sclerosis w/ mod stenosis, LA mildly dilated, PA pressure 85mmHg  . COPD (chronic obstructive pulmonary disease)     Dr. [name]  . PAF (paroxysmal atrial fibrillation)     Stopped flecainide, on amiodarone but still has bouts of A FIB (mostly in mornings).   . Tachy-brady syndrome     s/p permanent pacemaker 06/27/1999 (Battery change 06/2007)  . Hypothyroidism     on medication  . Anemia   . DVT (deep vein thrombosis) in pregnancy 1954    a. LLE  . Diastolic dysfunction     a. Echo 09/06/12 EF 55-60%, no WMA, G2DD, Ao valve sclerosis w/ mod stenosis, LA mildly dilated, PA pressure 16mmHg  . Osteoarthrosis, unspecified whether generalized or localized, other specified sites   . DDD (degenerative disc disease)   . Staghorn calculus     Left   Past Surgical History  Procedure Laterality Date  . Partial nephrectomy Left 05/1974    stone disease  . Bunionectomy with hammertoe reconstruction  Bilateral ~ 1990  . Total knee arthroplasty Right 2001  . Incisional hernia repair    . Cataract extraction w/ intraocular lens  implant, bilateral Bilateral   . Hernia repair    . Esophagogastroduodenoscopy (egd) with propofol N/A 04/22/2013    Procedure: ESOPHAGOGASTRODUODENOSCOPY (EGD) WITH PROPOFOL;  Surgeon: [name], MD;  Location: [facility];  Service: Endoscopy;  Laterality: N/A;  . Colonoscopy with propofol N/A 04/22/2013    Procedure: COLONOSCOPY WITH PROPOFOL;  Surgeon: [name], MD;  Location: [facility];  Service: Endoscopy;  Laterality: N/A;  . Tonsillectomy and adenoidectomy  1930's  . Appendectomy  ~ 1941  . Joint replacement    . Dilation and curettage of uterus  X 2    "when I was going thru menopause"  . Cardioversion N/A 12/02/2013    Procedure: CARDIOVERSION;  Surgeon: [name], MD;  Location: [place];  Service: Cardiovascular;  Laterality: N/A;  . Av node ablation  05/10/2014  . Cardiac pacemaker placement  06/27/99    Medtronic PM implanted by Dr [name]  . Insert / replace / remove pacemaker  06/2007    "took out the old; put in new"  . Insert / replace / remove pacemaker  05/10/2014    MDT PPM generator change by Dr [name]  . Permanent pacemaker generator change N/A 05/10/2014    Procedure: PERMANENT PACEMAKER GENERATOR CHANGE;  Surgeon:  Thompson Grayer, MD;  Location: [facility] CATH LAB;  Service: Cardiovascular;  Laterality: N/A;  . Av node ablation N/A 05/10/2014    Procedure: AV NODE ABLATION;  Surgeon: Thompson Grayer, MD;  Location: [facility] CATH LAB;  Service: Cardiovascular;  Laterality: N/A;     Current Outpatient Prescriptions  Medication Sig Dispense Refill  . albuterol (PROVENTIL HFA;VENTOLIN HFA) 108 (90 BASE) MCG/ACT inhaler Inhale 1-2 puffs into the lungs every 6 (six) hours as needed for wheezing or shortness of breath.    . cholecalciferol (VITAMIN D) 1000 UNITS tablet Take 2,000 Units by mouth daily.    . fluticasone (FLONASE) 50 MCG/ACT  nasal spray Place 2 sprays into the nose daily as needed for rhinitis.    . furosemide (LASIX) 40 MG tablet Take 40 mg by mouth 2 (two) times daily.     [name] levothyroxine (SYNTHROID, LEVOTHROID) 88 MCG tablet Take 88 mcg by mouth daily before breakfast.    . Lutein 20 MG CAPS Take 1 capsule by mouth daily.     . Magnesium 250 MG TABS Take 1 tablet by mouth daily.    . metoprolol succinate (TOPROL-XL) 50 MG 24 hr tablet Take 1/2 tablet daily    . mometasone-formoterol (DULERA) 100-5 MCG/ACT AERO Inhale 2 puffs into the lungs 2 (two) times daily.    . montelukast (SINGULAIR) 10 MG tablet Take 10 mg by mouth at bedtime.    . Multiple Vitamin (MULTIVITAMIN) tablet Take 1 tablet by mouth daily.     . potassium chloride SA (K-DUR,KLOR-CON) 20 MEQ tablet TAKE 2 TABLETS DAILY. (Patient taking differently: TAKE 2 TABLETS BY MOUTH DAILY.) 60 tablet 6  . XARELTO 15 MG TABS tablet Take 1 tablet by mouth  daily with supper 90 tablet 3   No current facility-administered medications for this visit.    Allergies:   Brimonidine tartrate-timolol and Penicillins   Social History:  The patient  reports that she has never smoked. She has never used smokeless tobacco. She reports that she drinks about 4.2 oz of alcohol per week. She reports that she does not use illicit drugs.   Family History:  The patient's family history includes Breast cancer in her mother; Heart attack in her brother; Hypertension in her father; Malignant hypertension in her father; Renal Disease in her father; Stroke in her brother.    ROS:  Please see the history of present illness.   All other systems are reviewed and negative.    PHYSICAL EXAM: VS:  BP 132/84 mmHg  Pulse 91  Ht 5\' 3"  (1.6 m)  Wt 136 lb (61.689 kg)  BMI 24.10 kg/m2  LMP 04/08/1974 , BMI Body mass index is 24.1 kg/(m^2). GEN: Well nourished, well developed, in no acute distress HEENT: normal Neck: no JVD, carotid bruits, or masses Cardiac: RRR (paced); no murmurs,  rubs, or gallops,no edema  Respiratory:  clear to auscultation bilaterally, normal work of breathing GI: soft, nontender, nondistended, + BS MS: no deformity or atrophy Skin: warm and dry, device pocket is well healed.  There was a small retained suture which I removed today Neuro:  Strength and sensation are intact Psych: euthymic mood, full affect  Device interrogation is reviewed today in detail.  See PaceArt for details.   Recent Labs: 12/03/2013: Magnesium 1.8 12/20/2013: Pro B Natriuretic peptide (BNP) 551.0* 05/03/2014: BUN 29*; Creatinine 1.00; Hemoglobin 13.5; Platelets 275.0; Potassium 3.8; Sodium 141    Lipid Panel  No results found for: CHOL, TRIG, HDL, CHOLHDL, VLDL, LDLCALC, LDLDIRECT  Wt Readings from Last 3 Encounters:  06/22/14 136 lb (61.689 kg)  05/11/14 137 lb 12.6 oz (62.5 kg)  04/27/14 141 lb 12.8 oz (64.32 kg)     ASSESSMENT AND PLAN:  1.  Permanent afib Continue long term anticoagulation chads2vasc score is at least 4 Reduce metoprolol to 25mg  daily today  2. HTN Stable No change required today Continue to wean off of metoprolol  3. Complete heart block Doing very well s/p AV nodal ablation Normal pacemaker function See Pace Art report Device reprogrammed VVIR 60 (from VVIR 70) today  Remote monitoring     Current medicines are reviewed at length with the patient today.   The patient does not have concerns regarding her medicines.  The following changes were made today:  none  Labs/ tests ordered today include:  Orders Placed This Encounter  Procedures  . Implantable device check  . EKG 12-Lead    Follow-up: I will see as scheduled 5/16  Signed, Thompson Grayer, MD  06/22/2014 2:40 PM     [place] [address] [phone] (office) [phone] (fax)

## 2014-07-06 ENCOUNTER — Ambulatory Visit (INDEPENDENT_AMBULATORY_CARE_PROVIDER_SITE_OTHER): Payer: 59 | Admitting: Internal Medicine

## 2014-07-06 ENCOUNTER — Encounter: Payer: Self-pay | Admitting: Internal Medicine

## 2014-07-06 VITALS — BP 118/72 | HR 70 | Ht 64.0 in | Wt 139.0 lb

## 2014-07-06 DIAGNOSIS — J42 Unspecified chronic bronchitis: Secondary | ICD-10-CM

## 2014-07-06 DIAGNOSIS — J449 Chronic obstructive pulmonary disease, unspecified: Secondary | ICD-10-CM

## 2014-07-06 DIAGNOSIS — J309 Allergic rhinitis, unspecified: Secondary | ICD-10-CM

## 2014-07-06 DIAGNOSIS — J3089 Other allergic rhinitis: Secondary | ICD-10-CM

## 2014-07-06 DIAGNOSIS — J302 Other seasonal allergic rhinitis: Secondary | ICD-10-CM

## 2014-07-06 NOTE — Progress Notes (Signed)
Subjective:    Patient ID: Jody Blake, female    DOB: 02-14-1926, 81 y.o.   MRN: [id number]  HPI 10/11/10- 81 yoF never smoker, followed for allergic rhinitis, chronic bronchitis, complicated by GERD, Hx PAfib/ pacemaker. Last here April 26, 2010. Note reviewed. She says the summer heat hasn't been so bad yet. She has to stop part way up hills or stairs to get her breath. Morning cough is normally. productive of clear phlegm. She walks daily and goes to fitness class twice weekly.  Should check on A1AT status next visit  06/27/11- 81 yoF never smoker, followed for allergic rhinitis, chronic bronchitis, complicated by GERD, Hx PAfib/ pacemaker. Acute visit-cough-productive-mostly clear and gets worse in the afternoon; streaks of blood (tiny amount) from time to time. Having SOB and wheezing as well 2 weeks ago exposed to cold wind outside. Sinus congestion cough malaise and weakness. Had congested with yellow nasal discharge but no headache. Chest feels wheezy with cough. Took one week of doxycycline and Mucinex DM. Denies fever, chills, pain.  06/26/12- 81 yoF never smoker, followed for allergic rhinitis, chronic bronchitis, complicated by GERD, Hx PAfib/ pacemaker. FOLLOWS FOR: SOB and wheezing at times-activity makes it worse; slight cough-productive-clear in color, congestion as well. One episode of winter bronchitis treated and resolved. Feels well now. Grew up in smoking household.  03/03/2013 Acute OV Complains of chest tightness, labored breathing, DOE, some prod cough with pale yellow mucus x3-4weeks.  denies f/c/s, hemoptysis, nausea, vomiting. Was on amiodarone few months ago briefly for Atrial Fib.  Now on Rythmol.  She denies any hemoptysis, orthopnea, PND. Does have chronic lower extremity swelling and is on Lasix. She denies any recent travel or antibiotic use. She has not taken any medications for her cough or congestion.  07/01/13- 81 yoF never smoker, followed for  allergic rhinitis, chronic bronchitis, complicated by GERD, Hx PAfib/ pacemaker. FOLLOWS FOR:  Gettting over the flu and has now turned into bronchitis.  Reports having cough with yellow mucus, wheezing and tightness in chest Treated for flu syndrome - doxy, few erythromycin, prednisone taper, Tamiflu. Much residual cough/ yellow, chest tight. Last abx 4 days ago. CXR 03/03/13 IMPRESSION:  1. There is hyperinflation consistent with known COPD. Stable  blunting of the posterior and lateral costophrenic angles on the  left is present.  2. There is no evidence of focal pneumonia nor evidence of CHF.  Electronically Signed  By: David Martinique  On: 03/03/2013 13:23  07/06/14- 81 yoF never smoker, followed for allergic rhinitis, chronic bronchitis, complicated by GERD, Hx PAfib/ pacemaker. FOLLOWS FOR: Pt stated her breathing has improved since last OV. pt stated she now has a pacemaker d/t afib, pt stated this has improved her breathing. Pt c/o mild cough with pale yellow mucus, PND and head congestion in morning. Pt denies CP/tightness. Mild dry cough.  ROS-see HPI   Negative unless "+" Constitutional:    weight loss, night sweats, fevers, chills, fatigue, lassitude. HEENT:    headaches, difficulty swallowing, tooth/dental problems, sore throat,       sneezing, itching, ear ache, nasal congestion, post nasal drip, snoring CV:    chest pain, orthopnea, PND, swelling in lower extremities, anasarca,                                  dizziness, palpitations Resp:   shortness of breath with exertion or at rest.  productive cough,   +non-productive cough, coughing up of blood.              change in color of mucus.  wheezing.   Skin:    rash or lesions. GI:  No-   heartburn, indigestion, abdominal pain, nausea, vomiting,  GU:  MS:   joint pain, stiffness,  Neuro-     nothing unusual Psych:  change in mood or affect.  depression or anxiety.   memory loss.  Objective:  OBJ- Physical  Exam General- Alert, Oriented, Affect-appropriate, Distress- none acute Skin- rash-none, lesions- none, excoriation- none Lymphadenopathy- none Head- atraumatic            Eyes- Gross vision intact, PERRLA, conjunctivae and secretions clear            Ears- Hearing, canals-normal            Nose- Clear, no-Septal dev, mucus, polyps, erosion, perforation             Throat- Mallampati II , mucosa clear , drainage- none, tonsils- atrophic Neck- flexible , trachea midline, no stridor , thyroid nl, carotid no bruit Chest - symmetrical excursion , unlabored           Heart/CV- RRR , no murmur , no gallop  , no rub, nl s1 s2                           - JVD- none , edema- none, stasis changes- none, varices- none           Lung- clear to P&A, wheeze- none, cough+light , dullness-none, rub- none           Chest wall-  Abd-  Br/ Gen/ Rectal- Not done, not indicated Extrem- cyanosis- none, clubbing, none, atrophy- none, strength- nl Neuro- grossly intact to observatio    Assessment & Plan:

## 2014-07-06 NOTE — Patient Instructions (Signed)
Order- CXR   Dx chronic bronchitis  You can try flonase 1-2 puffs each nostril once daily at bedtime for a while to see if it helps your morning nasal congestion  Please call as needed

## 2014-07-07 ENCOUNTER — Ambulatory Visit (INDEPENDENT_AMBULATORY_CARE_PROVIDER_SITE_OTHER)
Admission: RE | Admit: 2014-07-07 | Discharge: 2014-07-07 | Disposition: A | Discharge status: Home / Self Care | Payer: 59 | Source: Ambulatory Visit | Attending: Internal Medicine | Admitting: Internal Medicine

## 2014-07-07 DIAGNOSIS — J42 Unspecified chronic bronchitis: Secondary | ICD-10-CM | POA: Diagnosis not present

## 2014-07-08 ENCOUNTER — Telehealth: Payer: Self-pay | Admitting: Internal Medicine

## 2014-07-08 NOTE — Telephone Encounter (Signed)
Spoke with pt and notified of results per Dr. Young Pt verbalized understanding and denied any questions. 

## 2014-07-08 NOTE — Progress Notes (Signed)
Quick Note:  Spoke with pt and notified of results per Dr. Young. Pt verbalized understanding and denied any questions.  ______ 

## 2014-07-10 NOTE — Assessment & Plan Note (Addendum)
Minimal symptoms now. She attributes improvement in dyspnea to her pacemaker, indicating there had been a significant component of CHF. Plan-chest x-ray. I will be happy to see her back as needed.

## 2014-07-10 NOTE — Assessment & Plan Note (Signed)
Bothersome morning nasal congestion. Plan-try regular use of Flonase as discussed

## 2014-07-11 ENCOUNTER — Other Ambulatory Visit: Payer: Self-pay | Admitting: Internal Medicine

## 2014-07-14 ENCOUNTER — Telehealth (HOSPITAL_COMMUNITY): Payer: Self-pay | Admitting: *Deleted

## 2014-07-14 NOTE — Telephone Encounter (Signed)
Patient called stating this morning she had blurred vision and bp was elevated 169/110.  She took an extra half of her metoprolol and checked her bp 140/90 and vision had improved. Her son told her she needed to call and inform someone of this.  Patient states she does not feel like she has "more than normal" fluid or an increase in weight.  Reviewed with Roderic Palau, NP and recommended pt keep a log of her BP over next few days and can take the extra 1/2 dose of metoprolol if her BP is elevated. If her BP being elevated becomes a trend where she is having to take the extra dose of metoprolol often I instructed the patient to call back for an appointment so that we can reassess her medication regimen. Patient is agreeable to this plan.

## 2014-07-27 DIAGNOSIS — R350 Frequency of micturition: Secondary | ICD-10-CM | POA: Diagnosis not present

## 2014-07-27 DIAGNOSIS — R509 Fever, unspecified: Secondary | ICD-10-CM | POA: Diagnosis not present

## 2014-08-01 DIAGNOSIS — M7989 Other specified soft tissue disorders: Secondary | ICD-10-CM | POA: Diagnosis not present

## 2014-08-03 DIAGNOSIS — I872 Venous insufficiency (chronic) (peripheral): Secondary | ICD-10-CM | POA: Diagnosis not present

## 2014-08-03 DIAGNOSIS — I481 Persistent atrial fibrillation: Secondary | ICD-10-CM | POA: Diagnosis not present

## 2014-08-03 DIAGNOSIS — I10 Essential (primary) hypertension: Secondary | ICD-10-CM | POA: Diagnosis not present

## 2014-08-08 ENCOUNTER — Other Ambulatory Visit: Payer: Self-pay | Admitting: Internal Medicine

## 2014-08-12 ENCOUNTER — Other Ambulatory Visit: Payer: Self-pay

## 2014-08-12 MED ORDER — FUROSEMIDE 40 MG PO TABS: 40.0000 mg | ORAL_TABLET | Freq: Two times a day (BID) | ORAL | Status: DC

## 2014-08-15 ENCOUNTER — Ambulatory Visit (INDEPENDENT_AMBULATORY_CARE_PROVIDER_SITE_OTHER): Payer: Medicare Other | Admitting: Internal Medicine

## 2014-08-15 ENCOUNTER — Other Ambulatory Visit: Payer: Self-pay

## 2014-08-15 ENCOUNTER — Encounter: Payer: Self-pay | Admitting: Internal Medicine

## 2014-08-15 VITALS — BP 124/82 | HR 82 | Ht 64.0 in | Wt 138.2 lb

## 2014-08-15 DIAGNOSIS — I442 Atrioventricular block, complete: Secondary | ICD-10-CM

## 2014-08-15 DIAGNOSIS — I481 Persistent atrial fibrillation: Secondary | ICD-10-CM | POA: Diagnosis not present

## 2014-08-15 DIAGNOSIS — I495 Sick sinus syndrome: Secondary | ICD-10-CM | POA: Diagnosis not present

## 2014-08-15 DIAGNOSIS — I4819 Other persistent atrial fibrillation: Secondary | ICD-10-CM

## 2014-08-15 LAB — CUP PACEART INCLINIC DEVICE CHECK
Brady Statistic RV Percent Paced: 100 %
Date Time Interrogation Session: 20160509140732
Lead Channel Impedance Value: 67 Ohm
Lead Channel Impedance Value: 686 Ohm
Lead Channel Pacing Threshold Amplitude: 0.75 V
Lead Channel Pacing Threshold Pulse Width: 0.4 ms
Lead Channel Sensing Intrinsic Amplitude: 5.6 mV
Lead Channel Setting Pacing Amplitude: 2.5 V
MDC IDC MSMT BATTERY IMPEDANCE: 100 Ohm
MDC IDC MSMT BATTERY REMAINING LONGEVITY: 152 mo
MDC IDC MSMT BATTERY VOLTAGE: 2.8 V
MDC IDC SET LEADCHNL RV PACING PULSEWIDTH: 0.4 ms
MDC IDC SET LEADCHNL RV SENSING SENSITIVITY: 4 mV

## 2014-08-15 NOTE — Progress Notes (Signed)
Electrophysiology Office Note   Date:  08/15/2014   ID:  Jody Blake, DOB 1925-12-07, MRN [id number]  PCP:  Mathews Argyle, MD  Cardiologist:  Dr Marlou Porch Primary Electrophysiologist: Thompson Grayer, MD    Chief Complaint  Patient presents with  . Chronic AFIB     History of Present Illness: Jody Blake is a 81 y.o. female who presents today for electrophysiology evaluation.   She is doing very well s/p AV nodal ablation.  Her SOB is resolved.  Her edema is "much better" but continues to be an issue.  Today, she denies symptoms of palpitations, chest pain, orthopnea, PND,  claudication, dizziness, presyncope, syncope, bleeding, or neurologic sequela. The patient is tolerating medications without difficulties and is otherwise without complaint today.    Past Medical History  Diagnosis Date  . Hypertension   . Diverticulosis   . Asthmatic bronchitis   . Pure hypercholesterolemia   . Macular degeneration     Dr. [name]  . Aortic stenosis     a. Echo 09/06/12 EF 55-60%, no WMA, G2DD, Ao valve sclerosis w/ mod stenosis, LA mildly dilated, PA pressure 7mmHg  . COPD (chronic obstructive pulmonary disease)     Dr. [name]  . PAF (paroxysmal atrial fibrillation)     Stopped flecainide, on amiodarone but still has bouts of A FIB (mostly in mornings).   . Complete heart block     s/p permanent pacemaker 06/27/1999 (Battery change 06/2007 and 2016).  s/p AV nodal ablation by Dr [name] 2016.  [name] Hypothyroidism     on medication  . Anemia   . DVT (deep vein thrombosis) in pregnancy 1954    a. LLE  . Diastolic dysfunction     a. Echo 09/06/12 EF 55-60%, no WMA, G2DD, Ao valve sclerosis w/ mod stenosis, LA mildly dilated, PA pressure 29mmHg  . Osteoarthrosis, unspecified whether generalized or localized, other specified sites   . DDD (degenerative disc disease)   . Staghorn calculus     Left   Past Surgical History  Procedure Laterality Date  . Partial nephrectomy Left 05/1974   stone disease  . Bunionectomy with hammertoe reconstruction Bilateral ~ 1990  . Total knee arthroplasty Right 2001  . Incisional hernia repair    . Cataract extraction w/ intraocular lens  implant, bilateral Bilateral   . Hernia repair    . Esophagogastroduodenoscopy (egd) with propofol N/A 04/22/2013    Procedure: ESOPHAGOGASTRODUODENOSCOPY (EGD) WITH PROPOFOL;  Surgeon: [name], MD;  Location: [facility];  Service: Endoscopy;  Laterality: N/A;  . Colonoscopy with propofol N/A 04/22/2013    Procedure: COLONOSCOPY WITH PROPOFOL;  Surgeon: [name], MD;  Location: [facility];  Service: Endoscopy;  Laterality: N/A;  . Tonsillectomy and adenoidectomy  1930's  . Appendectomy  ~ 1941  . Joint replacement    . Dilation and curettage of uterus  X 2    "when I was going thru menopause"  . Cardioversion N/A 12/02/2013    Procedure: CARDIOVERSION;  Surgeon: [name], MD;  Location: [place];  Service: Cardiovascular;  Laterality: N/A;  . Av node ablation  05/10/2014  . Cardiac pacemaker placement  06/27/99    Medtronic PM implanted by Dr [name]  . Insert / replace / remove pacemaker  06/2007    "took out the old; put in new"  . Insert / replace / remove pacemaker  05/10/2014    MDT PPM generator change by Dr [name]  . Permanent pacemaker generator change  N/A 05/10/2014    Procedure: PERMANENT PACEMAKER GENERATOR CHANGE;  Surgeon: Thompson Grayer, MD;  Location: [facility] CATH LAB;  Service: Cardiovascular;  Laterality: N/A;  . Av node ablation N/A 05/10/2014    Procedure: AV NODE ABLATION;  Surgeon: Thompson Grayer, MD;  Location: [facility] CATH LAB;  Service: Cardiovascular;  Laterality: N/A;     Current Outpatient Prescriptions  Medication Sig Dispense Refill  . albuterol (PROVENTIL HFA;VENTOLIN HFA) 108 (90 BASE) MCG/ACT inhaler Inhale 1-2 puffs into the lungs every 6 (six) hours as needed for wheezing or shortness of breath.    . cholecalciferol (VITAMIN D) 1000 UNITS tablet Take 2,000  Units by mouth daily.    . fluticasone (FLONASE) 50 MCG/ACT nasal spray Place 2 sprays into the nose daily as needed for rhinitis.    . furosemide (LASIX) 40 MG tablet Take 1 tablet (40 mg total) by mouth 2 (two) times daily. 90 tablet 1  . levothyroxine (SYNTHROID, LEVOTHROID) 88 MCG tablet Take 88 mcg by mouth daily before breakfast.    . Lutein 20 MG CAPS Take 1 capsule by mouth daily.     . Magnesium 250 MG TABS Take 1 tablet by mouth daily.    . metoprolol succinate (TOPROL-XL) 50 MG 24 hr tablet Take 1/2 tablet daily (Patient taking differently: Take 1/2 tablet by mouth daily)    . mometasone-formoterol (DULERA) 200-5 MCG/ACT AERO Inhale 2 puffs into the lungs 2 (two) times daily.    . montelukast (SINGULAIR) 10 MG tablet Take 1 tablet by mouth  daily 90 tablet 1  . Multiple Vitamin (MULTIVITAMIN) tablet Take 1 tablet by mouth daily.     . potassium chloride SA (K-DUR,KLOR-CON) 20 MEQ tablet Take 1 tablet (20 mEq total) by mouth 2 (two) times daily. 60 tablet 3  . XARELTO 15 MG TABS tablet Take 1 tablet by mouth  daily with supper 90 tablet 3   No current facility-administered medications for this visit.    Allergies:   Brimonidine tartrate-timolol and Penicillins   Social History:  The patient  reports that she has never smoked. She has never used smokeless tobacco. She reports that she drinks about 4.2 oz of alcohol per week. She reports that she does not use illicit drugs.   Family History:  The patient's family history includes Breast cancer in her mother; Heart attack in her brother; Hypertension in her father; Malignant hypertension in her father; Renal Disease in her father; Stroke in her brother.    ROS:  Please see the history of present illness.   All other systems are reviewed and negative.    PHYSICAL EXAM: VS:  BP 124/82 mmHg  Pulse 82  Ht 5\' 4"  (1.626 m)  Wt 138 lb 3.2 oz (62.687 kg)  BMI 23.71 kg/m2  LMP 04/08/1974 , BMI Body mass index is 23.71 kg/(m^2). GEN:  Well nourished, well developed, in no acute distress HEENT: normal Neck: no JVD, carotid bruits, or masses Cardiac: RRR (paced); no murmurs, rubs, or gallops, +2 edema  Respiratory:  clear to auscultation bilaterally, normal work of breathing GI: soft, nontender, nondistended, + BS MS: no deformity or atrophy Skin: warm and dry, device pocket is well healed.  There was a small retained suture which I removed today Neuro:  Strength and sensation are intact Psych: euthymic mood, full affect  Device interrogation is reviewed today in detail.  See PaceArt for details.   Recent Labs: 12/03/2013: Magnesium 1.8 12/20/2013: Pro B Natriuretic peptide (BNP) 551.0* 05/03/2014: BUN 29*; Creatinine  1.00; Hemoglobin 13.5; Platelets 275.0; Potassium 3.8; Sodium 141    Lipid Panel  No results found for: CHOL, TRIG, HDL, CHOLHDL, VLDL, LDLCALC, LDLDIRECT   Wt Readings from Last 3 Encounters:  08/15/14 138 lb 3.2 oz (62.687 kg)  07/06/14 139 lb (63.05 kg)  06/22/14 136 lb (61.689 kg)     ASSESSMENT AND PLAN:  1.  Permanent afib Continue long term anticoagulation chads2vasc score is at least 4 Stop metoprolol today  2. HTN Stable No change required today  3. Complete heart block Doing very well s/p AV nodal ablation Normal pacemaker function See Pace Art report   4. CRI/ edema Would follow closely on lasix 40mg  BID Sodium restriction is advised Will return to see NP in 2 months to make sure that she does not need dose reduction in the summer months Follow-up with Dr Felipa Eth as scheduled  Remote monitoring (My [employer] Smart)  Current medicines are reviewed at length with the patient today.   The patient does not have concerns regarding her medicines.  The following changes were made today:  none  Labs/ tests ordered today include:  No orders of the defined types were placed in this encounter.    Follow-up: I will see in 12 months  Signed, Thompson Grayer, MD  08/15/2014 11:29  AM     [facility] [address] [place] [address] [phone] (office) [phone] (fax)

## 2014-08-15 NOTE — Patient Instructions (Addendum)
Medication Instructions:  Your physician has recommended you make the following change in your medication:  1) Stop Metoprolol    Labwork: None ordered  Testing/Procedures: None ordered  Follow-Up:  Your physician recommends that you schedule a follow-up appointment in: 2 months with Chanetta Marshall, NP  Your physician wants you to follow-up in: 12 months with Dr Vallery Ridge will receive a reminder letter in the mail two months in advance. If you don't receive a letter, please call our office to schedule the follow-up appointment.  Remote monitoring is used to monitor your Pacemaker or ICD from home. This monitoring reduces the number of office visits required to check your device to one time per year. It allows [place] to keep an eye on the functioning of your device to ensure it is working properly. You are scheduled for a device check from home on 11/14/14. You may send your transmission at any time that day. If you have a wireless device, the transmission will be sent automatically. After your physician reviews your transmission, you will receive a postcard with your next transmission date.    Any Other Special Instructions Will Be Listed Below (If Applicable).

## 2014-08-23 DIAGNOSIS — H3530 Unspecified macular degeneration: Secondary | ICD-10-CM | POA: Diagnosis not present

## 2014-08-23 DIAGNOSIS — D3132 Benign neoplasm of left choroid: Secondary | ICD-10-CM | POA: Diagnosis not present

## 2014-09-20 ENCOUNTER — Encounter: Payer: Self-pay | Admitting: Internal Medicine

## 2014-09-22 DIAGNOSIS — M5136 Other intervertebral disc degeneration, lumbar region: Secondary | ICD-10-CM | POA: Diagnosis not present

## 2014-09-22 DIAGNOSIS — M542 Cervicalgia: Secondary | ICD-10-CM | POA: Diagnosis not present

## 2014-09-22 DIAGNOSIS — M5442 Lumbago with sciatica, left side: Secondary | ICD-10-CM | POA: Diagnosis not present

## 2014-09-22 DIAGNOSIS — M5032 Other cervical disc degeneration, mid-cervical region: Secondary | ICD-10-CM | POA: Diagnosis not present

## 2014-09-23 ENCOUNTER — Telehealth: Payer: Self-pay | Admitting: Internal Medicine

## 2014-09-23 NOTE — Telephone Encounter (Signed)
Request for surgical clearance:  1. What type of surgery is being performed? Cervical epidermal injection and lumbar epidermal injection   2. When is this surgery scheduled? 7/12  3. Are there any medications that need to be held prior to surgery and how long? Requesting pt hold Xarelto 3 days prior   4. Name of physician performing surgery? Dr. Suella Broad  5. What is your office phone and fax number?    [place] Phone: [phone] Fax: [phone]

## 2014-10-03 NOTE — Telephone Encounter (Signed)
Pt has a CHADS score of 3.  Okay to hold Xarelto x 3 days prior to injection.  Will fax to [facility].

## 2014-10-18 DIAGNOSIS — M5032 Other cervical disc degeneration, mid-cervical region: Secondary | ICD-10-CM | POA: Diagnosis not present

## 2014-10-18 DIAGNOSIS — M5136 Other intervertebral disc degeneration, lumbar region: Secondary | ICD-10-CM | POA: Diagnosis not present

## 2014-11-04 ENCOUNTER — Ambulatory Visit: Payer: Medicare Other | Admitting: Obstetrics & Gynecology

## 2014-11-06 NOTE — Progress Notes (Signed)
Electrophysiology Office Note Date: 11/07/2014  ID:  Jody Blake, DOB 1925/04/15, MRN [id number]  PCP: Mathews Argyle, MD Primary Cardiologist: Marlou Porch Electrophysiologist: Allred  CC: follow-up for diastolic heart failure  Jody Blake is a 81 y.o. female seen today for Dr [name].  She has a history of complete heart block and permanent atrial fibrillation with difficult to control ventricular rates s/p AVN ablation.  She was seen in May of this year by Dr [name] and was doing well with improved shortness of breath and LE edema. She presents today for routine follow-up for AF and diastolic heart failure.   Since last being seen in our clinic, the patient reports doing very well.  She has decreased her Lasix to once daily 2/2 orthostatic intolerance and has added back metoprolol 25mg  daily for hypertension.  She denies chest pain, palpitations, dyspnea, PND, orthopnea, nausea, vomiting, dizziness, syncope.  Device History: MDT dual chamber PPM implanted 2001 for complete heart block; gen change 2009; gen change 2016; AVN ablation 2016   Past Medical History  Diagnosis Date  . Hypertension   . Diverticulosis   . Asthmatic bronchitis   . Pure hypercholesterolemia   . Macular degeneration     Dr. [name]  . Aortic stenosis     a. Echo 09/06/12 EF 55-60%, no WMA, G2DD, Ao valve sclerosis w/ mod stenosis, LA mildly dilated, PA pressure 76mmHg  . COPD (chronic obstructive pulmonary disease)     Dr. [name]  . PAF (paroxysmal atrial fibrillation)     Stopped flecainide, on amiodarone but still has bouts of A FIB (mostly in mornings).   . Complete heart block     s/p permanent pacemaker 06/27/1999 (Battery change 06/2007 and 2016).  s/p AV nodal ablation by Dr [name] 2016.  [name] Hypothyroidism     on medication  . Anemia   . DVT (deep vein thrombosis) in pregnancy 1954    a. LLE  . Diastolic dysfunction     a. Echo 09/06/12 EF 55-60%, no WMA, G2DD, Ao valve sclerosis w/ mod stenosis, LA  mildly dilated, PA pressure 81mmHg  . Osteoarthrosis, unspecified whether generalized or localized, other specified sites   . DDD (degenerative disc disease)   . Staghorn calculus     Left   Past Surgical History  Procedure Laterality Date  . Partial nephrectomy Left 05/1974    stone disease  . Bunionectomy with hammertoe reconstruction Bilateral ~ 1990  . Total knee arthroplasty Right 2001  . Incisional hernia repair    . Cataract extraction w/ intraocular lens  implant, bilateral Bilateral   . Hernia repair    . Esophagogastroduodenoscopy (egd) with propofol N/A 04/22/2013    Procedure: ESOPHAGOGASTRODUODENOSCOPY (EGD) WITH PROPOFOL;  Surgeon: [name], MD;  Location: [facility];  Service: Endoscopy;  Laterality: N/A;  . Colonoscopy with propofol N/A 04/22/2013    Procedure: COLONOSCOPY WITH PROPOFOL;  Surgeon: [name], MD;  Location: [facility];  Service: Endoscopy;  Laterality: N/A;  . Tonsillectomy and adenoidectomy  1930's  . Appendectomy  ~ 1941  . Joint replacement    . Dilation and curettage of uterus  X 2    "when I was going thru menopause"  . Cardioversion N/A 12/02/2013    Procedure: CARDIOVERSION;  Surgeon: [name], MD;  Location: [place];  Service: Cardiovascular;  Laterality: N/A;  . Av node ablation  05/10/2014  . Cardiac pacemaker placement  06/27/99    Medtronic PM implanted by Dr [name]  .  Insert / replace / remove pacemaker  06/2007    "took out the old; put in new"  . Insert / replace / remove pacemaker  05/10/2014    MDT PPM generator change by Dr [name]  . Permanent pacemaker generator change N/A 05/10/2014    Procedure: PERMANENT PACEMAKER GENERATOR CHANGE;  Surgeon: Thompson Grayer, MD;  Location: [facility] CATH LAB;  Service: Cardiovascular;  Laterality: N/A;  . Av node ablation N/A 05/10/2014    Procedure: AV NODE ABLATION;  Surgeon: Thompson Grayer, MD;  Location: [facility] CATH LAB;  Service: Cardiovascular;  Laterality: N/A;    Current Outpatient  Prescriptions  Medication Sig Dispense Refill  . albuterol (PROVENTIL HFA;VENTOLIN HFA) 108 (90 BASE) MCG/ACT inhaler Inhale 1-2 puffs into the lungs every 6 (six) hours as needed for wheezing or shortness of breath.    . cholecalciferol (VITAMIN D) 1000 UNITS tablet Take 1,000 Units by mouth daily.    . fluticasone (FLONASE) 50 MCG/ACT nasal spray Place 2 sprays into the nose daily as needed for rhinitis.    . furosemide (LASIX) 40 MG tablet Take 40 mg by mouth daily.    [name] levothyroxine (SYNTHROID, LEVOTHROID) 88 MCG tablet Take 88 mcg by mouth daily before breakfast.    . Lutein 20 MG CAPS Take 1 capsule by mouth daily.     . Magnesium 250 MG TABS Take 1 tablet by mouth daily.    . metoprolol succinate (TOPROL-XL) 50 MG 24 hr tablet Take 25 mg by mouth daily.    . mometasone-formoterol (DULERA) 200-5 MCG/ACT AERO Inhale 2 puffs into the lungs 2 (two) times daily.    . montelukast (SINGULAIR) 10 MG tablet Take 1 tablet by mouth  daily 90 tablet 1  . Multiple Vitamin (MULTIVITAMIN) tablet Take 1 tablet by mouth daily.     . potassium chloride SA (K-DUR,KLOR-CON) 20 MEQ tablet Take 20 mEq by mouth daily.    Alveda Reasons 15 MG TABS tablet Take 1 tablet by mouth  daily with supper 90 tablet 3   No current facility-administered medications for this visit.    Allergies:   Brimonidine tartrate-timolol and Penicillins   Social History: History   Social History  . Marital Status: Married    Spouse Name: N/A  . Number of Children: 2  . Years of Education: N/A   Occupational History  .    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 Smoking status: Never Smoker   . Smokeless tobacco: Never Used  . Alcohol Use: 4.2 oz/week    7 Glasses of wine per week  . Drug Use: No  . Sexual Activity:    Partners: Male   Other Topics Concern  . Not on file   Social History Narrative    Family History: Family History  Problem Relation Age of Onset  . Malignant  hypertension Father   . Hypertension Father   . Renal Disease Father   . Breast cancer Mother   . Heart attack Brother   . Stroke Brother      Review of Systems: All other systems reviewed and are otherwise negative except as noted above.   Physical Exam: VS:  BP 150/86 mmHg  Pulse 67  Ht 5\' 4"  (1.626 m)  Wt 138 lb 6.4 oz (62.778 kg)  BMI 23.74 kg/m2  SpO2 96%  LMP 04/08/1974 , BMI Body mass index is 23.74 kg/(m^2).  GEN- The patient is elderly appearing, alert and oriented  x 3 today.   HEENT: normocephalic, atraumatic; sclera clear, conjunctiva pink; hearing intact; oropharynx clear; neck supple  Lungs- Clear to ausculation bilaterally, normal work of breathing.  No wheezes, rales, rhonchi Heart- Regular rate and rhythm (paced) GI- soft, non-tender, non-distended, bowel sounds present  Extremities- no clubbing, cyanosis, 1+ BLE edema MS- no significant deformity or atrophy Skin- warm and dry, no rash or lesion; PPM pocket well healed Psych- euthymic mood, full affect Neuro- strength and sensation are intact  PPM Interrogation- reviewed in detail today,  See PACEART report  EKG:  EKG is not ordered today.  Recent Labs: 12/03/2013: Magnesium 1.8 12/20/2013: Pro B Natriuretic peptide (BNP) 551.0* 05/03/2014: BUN 29*; Creatinine, Ser 1.00; Hemoglobin 13.5; Platelets 275.0; Potassium 3.8; Sodium 141   Wt Readings from Last 3 Encounters:  11/07/14 138 lb 6.4 oz (62.778 kg)  08/15/14 138 lb 3.2 oz (62.687 kg)  07/06/14 139 lb (63.05 kg)     Other studies Reviewed: Additional studies/ records that were reviewed today include: hospital records, Dr Jackalyn Lombard office notes  Assessment and Plan:  1.  Complete heart block Normal PPM function - pt is device dependent  See Pace Art report No changes today  2.  Permanent atrial fibrillation S/p AVN ablation Continue Xarelto for CHADS2VASC of at least 4  3.  Chronic diastolic heart failure Euvolemic on exam Symptoms much  improved s/p AVN ablation BMET, CBC today   Current medicines are reviewed at length with the patient today.   The patient does not have concerns regarding her medicines.  The following changes were made today:  none  Labs/ tests ordered today include: BMET, CBC   Disposition:   Follow up with Dr Marlou Porch as scheduled; remote monitoring; follow up with Dr [name] 08/2014 as scheduled   Signed, Chanetta Marshall, NP 11/07/2014 11:41 AM  [facility] [address] [place] [address] [phone] (office) [phone] (fax)

## 2014-11-07 ENCOUNTER — Ambulatory Visit (INDEPENDENT_AMBULATORY_CARE_PROVIDER_SITE_OTHER): Payer: 59 | Admitting: Nurse Practitioner

## 2014-11-07 ENCOUNTER — Encounter: Payer: Self-pay | Admitting: Nurse Practitioner

## 2014-11-07 ENCOUNTER — Other Ambulatory Visit: Payer: Self-pay | Admitting: Internal Medicine

## 2014-11-07 VITALS — BP 150/86 | HR 67 | Ht 64.0 in | Wt 138.4 lb

## 2014-11-07 DIAGNOSIS — I4821 Permanent atrial fibrillation: Secondary | ICD-10-CM

## 2014-11-07 DIAGNOSIS — I482 Chronic atrial fibrillation: Secondary | ICD-10-CM

## 2014-11-07 DIAGNOSIS — I5032 Chronic diastolic (congestive) heart failure: Secondary | ICD-10-CM | POA: Diagnosis not present

## 2014-11-07 DIAGNOSIS — I442 Atrioventricular block, complete: Secondary | ICD-10-CM

## 2014-11-07 LAB — CBC
HCT: 40.8 % (ref 36.0–46.0)
Hemoglobin: 13.4 g/dL (ref 12.0–15.0)
MCHC: 32.8 g/dL (ref 30.0–36.0)
MCV: 95.6 fl (ref 78.0–100.0)
Platelets: 235 10*3/uL (ref 150.0–400.0)
RBC: 4.26 Mil/uL (ref 3.87–5.11)
RDW: 14.5 % (ref 11.5–15.5)
WBC: 6.8 10*3/uL (ref 4.0–10.5)

## 2014-11-07 LAB — CUP PACEART INCLINIC DEVICE CHECK
Lead Channel Setting Pacing Amplitude: 2.5 V
Lead Channel Setting Pacing Amplitude: 2.5 V
Lead Channel Setting Pacing Pulse Width: 0.4 ms
MDC IDC SESS DTM: 20160801114417
MDC IDC SESS DTM: 20160825083832
MDC IDC SET LEADCHNL RV PACING PULSEWIDTH: 0.4 ms
MDC IDC SET LEADCHNL RV SENSING SENSITIVITY: 4 mV
MDC IDC SET LEADCHNL RV SENSING SENSITIVITY: 4 mV

## 2014-11-07 LAB — BASIC METABOLIC PANEL
BUN: 26 mg/dL — ABNORMAL HIGH (ref 6–23)
CALCIUM: 9.9 mg/dL (ref 8.4–10.5)
CHLORIDE: 101 meq/L (ref 96–112)
CO2: 31 mEq/L (ref 19–32)
Creatinine, Ser: 0.92 mg/dL (ref 0.40–1.20)
GFR: 61.41 mL/min (ref >60.00)
Glucose, Bld: 85 mg/dL (ref 70–99)
POTASSIUM: 4.1 meq/L (ref 3.5–5.1)
Sodium: 139 mEq/L (ref 135–145)

## 2014-11-07 NOTE — Patient Instructions (Addendum)
Medication Instructions:  Your physician recommends that you continue on your current medications as directed. Please refer to the Current Medication list given to you today.    Labwork:  BMET AND CBC    Testing/Procedures:   Follow-Up:  Remote monitoring is used to monitor your Pacemaker of ICD from home. This monitoring reduces the number of office visits required to check your device to one time per year. It allows [place] to keep an eye on the functioning of your device to ensure it is working properly. You are scheduled for a device check from home on 10/31/016 . You may send your transmission at any time that day. If you have a wireless device, the transmission will be sent automatically. After your physician reviews your transmission, you will receive a postcard with your next transmission date.  Your physician wants you to follow-up in:  [place] will receive a reminder letter in the mail two months in advance. If you don't receive a letter, please call our office to schedule the follow-up appointment.'     Any Other Special Instructions Will Be Listed Below (If Applicable).

## 2014-11-14 ENCOUNTER — Encounter: Payer: Self-pay | Admitting: Internal Medicine

## 2014-11-14 ENCOUNTER — Ambulatory Visit: Payer: Medicare Other | Admitting: *Deleted

## 2014-11-14 ENCOUNTER — Telehealth: Payer: Self-pay | Admitting: Cardiology

## 2014-11-14 DIAGNOSIS — I495 Sick sinus syndrome: Secondary | ICD-10-CM | POA: Diagnosis not present

## 2014-11-14 NOTE — Telephone Encounter (Signed)
LMOVM reminding pt to send remote transmission.   

## 2014-11-14 NOTE — Progress Notes (Signed)
Remote pacemaker transmission.   

## 2014-11-21 DIAGNOSIS — Z1389 Encounter for screening for other disorder: Secondary | ICD-10-CM | POA: Diagnosis not present

## 2014-11-21 DIAGNOSIS — I10 Essential (primary) hypertension: Secondary | ICD-10-CM | POA: Diagnosis not present

## 2014-11-21 DIAGNOSIS — I481 Persistent atrial fibrillation: Secondary | ICD-10-CM | POA: Diagnosis not present

## 2014-11-21 DIAGNOSIS — Z0000 Encounter for general adult medical examination without abnormal findings: Secondary | ICD-10-CM | POA: Diagnosis not present

## 2014-11-23 NOTE — Progress Notes (Signed)
Seen in office 11/07/14. Next [facility] 02/06/15.

## 2014-11-28 ENCOUNTER — Encounter: Payer: Self-pay | Admitting: Internal Medicine

## 2014-12-29 ENCOUNTER — Ambulatory Visit (INDEPENDENT_AMBULATORY_CARE_PROVIDER_SITE_OTHER): Payer: Medicare Other | Admitting: Podiatry

## 2014-12-29 ENCOUNTER — Ambulatory Visit (INDEPENDENT_AMBULATORY_CARE_PROVIDER_SITE_OTHER): Payer: Medicare Other

## 2014-12-29 VITALS — BP 140/75 | HR 74 | Resp 16 | Ht 64.0 in | Wt 135.0 lb

## 2014-12-29 DIAGNOSIS — Q6650 Congenital pes planus, unspecified foot: Secondary | ICD-10-CM | POA: Diagnosis not present

## 2014-12-29 DIAGNOSIS — R609 Edema, unspecified: Secondary | ICD-10-CM | POA: Diagnosis not present

## 2014-12-29 DIAGNOSIS — M79673 Pain in unspecified foot: Secondary | ICD-10-CM | POA: Diagnosis not present

## 2014-12-29 DIAGNOSIS — M722 Plantar fascial fibromatosis: Secondary | ICD-10-CM

## 2014-12-29 MED ORDER — TRIAMCINOLONE ACETONIDE 10 MG/ML IJ SUSP
10.0000 mg | Freq: Once | INTRAMUSCULAR | Status: AC
  Administered 2014-12-29: 10 mg

## 2014-12-29 NOTE — Progress Notes (Signed)
Subjective:     Patient ID: Jody Blake, female   DOB: 05-05-1925, 81 y.o.   MRN: [id number]  HPI patient presents with pain in the left heel and states that it has been very bad for the last 3 weeks. She tries stay active and is not been able to and the pain is intense when she gets up in the morning or after periods of sitting   Review of Systems  All other systems reviewed and are negative.      Objective:   Physical Exam  Constitutional: She is oriented to person, place, and time.  Cardiovascular: Intact distal pulses.   Musculoskeletal: Normal range of motion.  Neurological: She is oriented to person, place, and time.  Skin: Skin is warm.  Nursing note and vitals reviewed.  neurovascular status was found to be intact with muscle strength adequate range of motion within normal limits. Patient was found to have moderate depression of the arch and is noted to have intense discomfort in the medial band of the plantar fascia. The heel bone itself is not sore and DTR reflexes were found to be intact. Patient has good digital perfusion and is well oriented 3 with no equinus condition     Assessment:     Acute plantar fasciitis left at the insertional point tendon into the calcaneus    Plan:     H&P and x-rays were reviewed and today I injected the left plantar fashion 3 mg Kenalog 5 mg Xylocaine and applied fascial brace and due to intense nighttime discomfort dispensed night splint with instructions on usage. Advised on supportive shoe gear usage and reappoint to recheck

## 2014-12-29 NOTE — Progress Notes (Signed)
   Subjective:    Patient ID: Jody Blake, female    DOB: Oct 28, 1925, 81 y.o.   MRN: [id number]  HPI Patient presents with bilateral foot pain, heel. On the left foot-lateral side of foot hurts as well. Pt has tried to stretch feet with no relief.  This has been going on for the past 3 weeks.  Review of Systems  Musculoskeletal: Positive for back pain.  Allergic/Immunologic: Positive for environmental allergies.  All other systems reviewed and are negative.      Objective:   Physical Exam        Assessment & Plan:

## 2014-12-29 NOTE — Patient Instructions (Signed)

## 2015-01-10 ENCOUNTER — Encounter: Payer: Self-pay | Admitting: Podiatry

## 2015-01-10 ENCOUNTER — Ambulatory Visit (INDEPENDENT_AMBULATORY_CARE_PROVIDER_SITE_OTHER): Payer: Medicare Other | Admitting: Podiatry

## 2015-01-10 VITALS — BP 122/67 | HR 87 | Resp 16

## 2015-01-10 DIAGNOSIS — M722 Plantar fascial fibromatosis: Secondary | ICD-10-CM

## 2015-01-10 MED ORDER — TRIAMCINOLONE ACETONIDE 10 MG/ML IJ SUSP
10.0000 mg | Freq: Once | INTRAMUSCULAR | Status: AC
  Administered 2015-01-10: 10 mg

## 2015-01-11 ENCOUNTER — Ambulatory Visit: Payer: Medicare Other | Admitting: Podiatry

## 2015-01-11 NOTE — Progress Notes (Signed)
Subjective:     Patient ID: Jody Blake, female   DOB: 18-Dec-1925, 81 y.o.   MRN: [id number]  HPI patient states that my heel is feeling some better on the inside but the outside is sore underneath   Review of Systems     Objective:   Physical Exam Neurovascular status intact improvement of the plantar fascial left medial side with moderate discomfort in the lateral band    Assessment:     Lateral band plantar fasciitis left    Plan:     Injected the lateral band 3 mg Kenalog 5 mg Xylocaine advised on physical therapy

## 2015-01-12 ENCOUNTER — Ambulatory Visit: Payer: Medicare Other | Admitting: Podiatry

## 2015-01-25 ENCOUNTER — Other Ambulatory Visit: Payer: Self-pay | Admitting: Nurse Practitioner

## 2015-01-25 ENCOUNTER — Emergency Department (HOSPITAL_COMMUNITY)
Admission: EM | Admit: 2015-01-25 | Discharge: 2015-01-26 | Disposition: A | Discharge status: Home / Self Care | Payer: 59 | Attending: Emergency Medicine | Admitting: Emergency Medicine

## 2015-01-25 ENCOUNTER — Encounter (HOSPITAL_COMMUNITY): Payer: Self-pay | Admitting: *Deleted

## 2015-01-25 ENCOUNTER — Ambulatory Visit
Admission: RE | Admit: 2015-01-25 | Discharge: 2015-01-25 | Disposition: A | Discharge status: Home / Self Care | Payer: Medicare Other | Source: Ambulatory Visit | Attending: Nurse Practitioner | Admitting: Nurse Practitioner

## 2015-01-25 DIAGNOSIS — R531 Weakness: Secondary | ICD-10-CM | POA: Diagnosis not present

## 2015-01-25 DIAGNOSIS — I10 Essential (primary) hypertension: Secondary | ICD-10-CM | POA: Diagnosis not present

## 2015-01-25 DIAGNOSIS — J449 Chronic obstructive pulmonary disease, unspecified: Secondary | ICD-10-CM | POA: Insufficient documentation

## 2015-01-25 DIAGNOSIS — Z86718 Personal history of other venous thrombosis and embolism: Secondary | ICD-10-CM | POA: Diagnosis not present

## 2015-01-25 DIAGNOSIS — Z862 Personal history of diseases of the blood and blood-forming organs and certain disorders involving the immune mechanism: Secondary | ICD-10-CM | POA: Insufficient documentation

## 2015-01-25 DIAGNOSIS — R51 Headache: Secondary | ICD-10-CM

## 2015-01-25 DIAGNOSIS — R000 Tachycardia, unspecified: Secondary | ICD-10-CM | POA: Insufficient documentation

## 2015-01-25 DIAGNOSIS — E860 Dehydration: Secondary | ICD-10-CM

## 2015-01-25 DIAGNOSIS — E039 Hypothyroidism, unspecified: Secondary | ICD-10-CM | POA: Diagnosis not present

## 2015-01-25 DIAGNOSIS — H538 Other visual disturbances: Secondary | ICD-10-CM

## 2015-01-25 DIAGNOSIS — G8929 Other chronic pain: Secondary | ICD-10-CM

## 2015-01-25 DIAGNOSIS — E7800 Pure hypercholesterolemia, unspecified: Secondary | ICD-10-CM | POA: Diagnosis not present

## 2015-01-25 DIAGNOSIS — R4789 Other speech disturbances: Secondary | ICD-10-CM

## 2015-01-25 DIAGNOSIS — Z79899 Other long term (current) drug therapy: Secondary | ICD-10-CM | POA: Insufficient documentation

## 2015-01-25 DIAGNOSIS — R29818 Other symptoms and signs involving the nervous system: Secondary | ICD-10-CM

## 2015-01-25 DIAGNOSIS — R519 Headache, unspecified: Secondary | ICD-10-CM

## 2015-01-25 DIAGNOSIS — R42 Dizziness and giddiness: Secondary | ICD-10-CM | POA: Insufficient documentation

## 2015-01-25 DIAGNOSIS — I159 Secondary hypertension, unspecified: Secondary | ICD-10-CM

## 2015-01-25 DIAGNOSIS — R479 Unspecified speech disturbances: Secondary | ICD-10-CM | POA: Diagnosis not present

## 2015-01-25 DIAGNOSIS — R404 Transient alteration of awareness: Secondary | ICD-10-CM | POA: Diagnosis not present

## 2015-01-25 DIAGNOSIS — G459 Transient cerebral ischemic attack, unspecified: Secondary | ICD-10-CM

## 2015-01-25 NOTE — ED Notes (Signed)
Pt to ED via [facility] from [facility] c/o headache, weakness, and slurred speech. On Saturday, Pt had an episode of slurred speech that resolved. Pt had a CT scan today, which pt has not received the results for. LSN [id number]. Pt reports increased weakness when standing, generalized headache, and blurred vision. Pt A&Ox4. Slight drift in L leg and weaker grip to L side

## 2015-01-25 NOTE — ED Provider Notes (Signed)
CSN: [id number]   Arrival date & time 01/25/15 2305  History  By signing my name below, I, Jody Blake, attest that this documentation has been prepared under the direction and in the presence of Jody Rice, MD. Electronically Signed: Altamease Blake, ED Scribe. 01/25/2015. 11:57 PM.  Chief Complaint  Patient presents with  . Headache  . Cerebrovascular Accident    HPI The history is provided by the patient. No language interpreter was used.   Brought in by EMS from [facility] [place], [place] Jody Blake is a 81 y.o. female with history of HTN, hypercholesteremia, and macular degeneration who presents to the Emergency Department complaining of a new headache with onset 2 days ago. She describes the pain as pressure and notes that it worsened today.  Associated symptoms include generalized weakness, light headedness, and blurred vision. At dinner tonight she had new heartburn stating that her food "bubbled up" in her mouth. She saw her doctor today because her blood pressure has been high (160/110 in the office today) and she had a 1 hour episode of garbled speech 4 days ago that has resolved. She had a CT head at her doctors office and has not yet received the results. Pt denies new gait or balance difficulty, chest pain or pressure, difficulty breathing, nausea, fever, chills, dysuria, increased frequency, and difficulty urinating.   Past Medical History  Diagnosis Date  . Hypertension   . Diverticulosis   . Asthmatic bronchitis   . Pure hypercholesterolemia   . Macular degeneration     Dr. [name]  . Aortic stenosis     a. Echo 09/06/12 EF 55-60%, no WMA, G2DD, Ao valve sclerosis w/ mod stenosis, LA mildly dilated, PA pressure 36mmHg  . COPD (chronic obstructive pulmonary disease) (HCC)     Dr. [name]  . PAF (paroxysmal atrial fibrillation) (HCC)     Stopped flecainide, on amiodarone but still has bouts of A FIB (mostly in mornings).   . Complete heart block [facility])     s/p permanent  pacemaker 06/27/1999 (Battery change 06/2007 and 2016).  s/p AV nodal ablation by Dr [name] 2016.  [name] Hypothyroidism     on medication  . Anemia   . DVT (deep vein thrombosis) in pregnancy 1954    a. LLE  . Diastolic dysfunction     a. Echo 09/06/12 EF 55-60%, no WMA, G2DD, Ao valve sclerosis w/ mod stenosis, LA mildly dilated, PA pressure 59mmHg  . Osteoarthrosis, unspecified whether generalized or localized, other specified sites   . DDD (degenerative disc disease)   . Staghorn calculus     Left    Past Surgical History  Procedure Laterality Date  . Partial nephrectomy Left 05/1974    stone disease  . Bunionectomy with hammertoe reconstruction Bilateral ~ 1990  . Total knee arthroplasty Right 2001  . Incisional hernia repair    . Cataract extraction w/ intraocular lens  implant, bilateral Bilateral   . Hernia repair    . Esophagogastroduodenoscopy (egd) with propofol N/A 04/22/2013    Procedure: ESOPHAGOGASTRODUODENOSCOPY (EGD) WITH PROPOFOL;  Surgeon: Jody Fair, MD;  [facility];  Service: Endoscopy;  Laterality: N/A;  . Colonoscopy with propofol N/A 04/22/2013    Procedure: COLONOSCOPY WITH PROPOFOL;  Surgeon: Jody Fair, MD;  [facility];  Service: Endoscopy;  Laterality: N/A;  . Tonsillectomy and adenoidectomy  1930's  . Appendectomy  ~ 1941  . Joint replacement    . Dilation and curettage of uterus  X 2    "  when I was going thru menopause"  . Cardioversion N/A 12/02/2013    Procedure: CARDIOVERSION;  Surgeon: Jody Records, MD;  Location: [place];  Service: Cardiovascular;  Laterality: N/A;  . Av node ablation  05/10/2014  . Cardiac pacemaker placement  06/27/99    Medtronic PM implanted by Dr Jody Blake  . Insert / replace / remove pacemaker  06/2007    "took out the old; put in new"  . Insert / replace / remove pacemaker  05/10/2014    MDT PPM generator change by Dr [name]  . Permanent pacemaker generator change N/A 05/10/2014    Procedure:  PERMANENT PACEMAKER GENERATOR CHANGE;  Surgeon: Jody Grayer, MD;  Location: [facility] CATH LAB;  Service: Cardiovascular;  Laterality: N/A;  . Av node ablation N/A 05/10/2014    Procedure: AV NODE ABLATION;  Surgeon: Jody Grayer, MD;  Location: [facility] CATH LAB;  Service: Cardiovascular;  Laterality: N/A;    Family History  Problem Relation Age of Onset  . Malignant hypertension Father   . Hypertension Father   . Renal Disease Father   . Breast cancer Mother   . Heart attack Brother   . Stroke Brother     Social History  Substance Use Topics  . Smoking status: Never Smoker   . Smokeless tobacco: Never Used  . Alcohol Use: 4.2 oz/week    7 Glasses of wine per week     Review of Systems  Constitutional: Negative for fever and chills.  Eyes: Positive for visual disturbance.  Respiratory: Negative for cough and shortness of breath.   Cardiovascular: Negative for chest pain, palpitations and leg swelling.  Gastrointestinal: Negative for nausea, vomiting, abdominal pain and diarrhea.  Genitourinary: Positive for hematuria. Negative for dysuria, frequency and flank pain.  Musculoskeletal: Negative for back pain, neck pain and neck stiffness.  Skin: Negative for rash and wound.  Neurological: Positive for dizziness, weakness (generalized), light-headedness and headaches. Negative for syncope and numbness.   Home Medications   Prior to Admission medications   Medication Sig Start Date End Date Taking? Authorizing Provider  albuterol (PROVENTIL HFA;VENTOLIN HFA) 108 (90 BASE) MCG/ACT inhaler Inhale 1-2 puffs into the lungs every 6 (six) hours as needed for wheezing or shortness of breath.   Yes Historical Provider, MD  cholecalciferol (VITAMIN D) 1000 UNITS tablet Take 1,000 Units by mouth daily.   Yes Historical Provider, MD  fluticasone (FLONASE) 50 MCG/ACT nasal spray Place 2 sprays into the nose daily as needed for rhinitis. 12/18/12  Yes Deneise Lever, MD  furosemide (LASIX) 40 MG tablet Take  40 mg by mouth daily.   Yes Historical Provider, MD  levothyroxine (SYNTHROID, LEVOTHROID) 88 MCG tablet Take 88 mcg by mouth daily before breakfast.   Yes Historical Provider, MD  lisinopril (PRINIVIL,ZESTRIL) 10 MG tablet Take 10 mg by mouth daily. 01/25/15  Yes Historical Provider, MD  Magnesium 250 MG TABS Take 1 tablet by mouth daily.   Yes Historical Provider, MD  metoprolol succinate (TOPROL-XL) 50 MG 24 hr tablet Take 25 mg by mouth daily. 09/25/14  Yes Historical Provider, MD  mometasone-formoterol (DULERA) 200-5 MCG/ACT AERO Inhale 2 puffs into the lungs 2 (two) times daily.   Yes Historical Provider, MD  montelukast (SINGULAIR) 10 MG tablet Take 1 tablet by mouth  daily 08/08/14  Yes Deneise Lever, MD  potassium chloride SA (Jody-DUR,KLOR-CON) 20 MEQ tablet Take 20 mEq by mouth daily.   Yes Historical Provider, MD  XARELTO 15 MG TABS tablet Take 1  tablet by mouth  daily with supper 05/12/14  Yes Jody Grayer, MD    Allergies  Brimonidine tartrate-timolol and Penicillins  Triage Vitals: BP 148/85 mmHg  Pulse 62  Temp(Src) 97.6 F (36.4 C) (Oral)  Resp 16  SpO2 98%  LMP 04/08/1974  Physical Exam  Constitutional: She is oriented to person, place, and time. She appears well-developed and well-nourished. No distress.  HENT:  Head: Normocephalic and atraumatic.  Mouth/Throat: Oropharynx is clear and moist. No oropharyngeal exudate.  Eyes: EOM are normal. Pupils are equal, round, and reactive to light.  Neck: Normal range of motion. Neck supple.  Cardiovascular: Normal rate and regular rhythm.   Murmur heard. Pulmonary/Chest: Effort normal and breath sounds normal. No respiratory distress. She has no wheezes. She has no rales.  Abdominal: Soft. Bowel sounds are normal. She exhibits no distension and no mass. There is no tenderness. There is no rebound and no guarding.  Musculoskeletal: Normal range of motion. She exhibits no edema or tenderness.  No lower extremity swelling or pain.   Neurological: She is alert and oriented to person, place, and time.  Patient is alert and oriented x3 with clear, goal oriented speech. Patient has 5/5 motor in all extremities. Sensation is intact to light touch. Bilateral finger-to-nose is normal but slow and intentional.   Skin: Skin is warm and dry. No rash noted. No erythema.  Psychiatric: She has a normal mood and affect. Her behavior is normal.  Nursing note and vitals reviewed.   ED Course  Procedures   DIAGNOSTIC STUDIES: Oxygen Saturation is 98% on RA, normal by my interpretation.    COORDINATION OF CARE: 11:39 PM Discussed treatment plan which includes lab work and EKG with pt at bedside and pt agreed to plan.  Labs Reviewed  BASIC METABOLIC PANEL - Abnormal; Notable for the following:    Chloride 99 (*)    Glucose, Bld 107 (*)    BUN 29 (*)    Creatinine, Ser 1.20 (*)    GFR calc non Af Amer 40 (*)    GFR calc Af Amer 46 (*)    All other components within normal limits  URINALYSIS, ROUTINE W REFLEX MICROSCOPIC (NOT AT [facility]) - Abnormal; Notable for the following:    APPearance CLOUDY (*)    Specific Gravity, Urine >1.030 (*)    Ketones, ur 15 (*)    Leukocytes, UA TRACE (*)    All other components within normal limits  HEPATIC FUNCTION PANEL - Abnormal; Notable for the following:    Total Protein 6.3 (*)    Indirect Bilirubin 0.2 (*)    All other components within normal limits  PROTIME-INR - Abnormal; Notable for the following:    Prothrombin Time 20.9 (*)    INR 1.80 (*)    All other components within normal limits  URINE MICROSCOPIC-ADD ON - Abnormal; Notable for the following:    Casts HYALINE CASTS (*)    All other components within normal limits  CBC  APTT  I-STAT TROPOININ, ED    Imaging Review No results found.  I personally reviewed and evaluated these lab results as a part of my medical decision-making.   EKG Interpretation  Date/Time:  Wednesday January 25 2015 23:20:40 EDT Ventricular  Rate:  65 PR Interval:    QRS Duration: 166 QT Interval:  466 QTC Calculation: 485 R Axis:   -83 Text Interpretation:  Confirmed by Lita Mains  MD, Aleph Nickson ([id number]) on 01/26/2015 5:13:25 AM    MDM  Final diagnoses:  Nonintractable headache, unspecified chronicity pattern, unspecified headache type  Mild dehydration     I, Aunya Lemler, personally performed the services described in this documentation. All medical record entries made by the scribe were at my direction and in my presence.  I have reviewed the chart and discharge instructions and agree that the record reflects my personal performance and is accurate and complete. Dixie Jafri.  01/26/2015. 6:19 AM.    Patient is feeling much better. Discussed with Dr. Leonel Ramsay. He reviewed the patient's CT from earlier in the day. No obvious acute pathology. Suggested continued outpatient workup. Patient with mild elevation in creatinine and concentrated urine. Possibly indicative of some level of dehydration. Given small fluid bolus. Anticipate discharge home to follow-up with her primary physician.  Jody Rice, MD 01/26/15 [phone]

## 2015-01-26 LAB — URINE MICROSCOPIC-ADD ON

## 2015-01-26 LAB — URINALYSIS, ROUTINE W REFLEX MICROSCOPIC
BILIRUBIN URINE: NEGATIVE
Glucose, UA: NEGATIVE mg/dL
Hgb urine dipstick: NEGATIVE
Ketones, ur: 15 mg/dL — AB
NITRITE: NEGATIVE
Protein, ur: NEGATIVE mg/dL
UROBILINOGEN UA: 0.2 mg/dL (ref 0.0–1.0)
pH: 6 (ref 5.0–8.0)

## 2015-01-26 LAB — BASIC METABOLIC PANEL
Anion gap: 9 (ref 5–15)
BUN: 29 mg/dL — AB (ref 6–20)
CHLORIDE: 99 mmol/L — AB (ref 101–111)
CO2: 28 mmol/L (ref 22–32)
CREATININE: 1.2 mg/dL — AB (ref 0.44–1.00)
Calcium: 9.3 mg/dL (ref 8.9–10.3)
GFR calc Af Amer: 46 mL/min — ABNORMAL LOW (ref >60)
GFR calc non Af Amer: 40 mL/min — ABNORMAL LOW (ref >60)
GLUCOSE: 107 mg/dL — AB (ref 65–99)
POTASSIUM: 3.6 mmol/L (ref 3.5–5.1)
SODIUM: 136 mmol/L (ref 135–145)

## 2015-01-26 LAB — HEPATIC FUNCTION PANEL
ALT: 16 U/L (ref 14–54)
AST: 31 U/L (ref 15–41)
Albumin: 3.6 g/dL (ref 3.5–5.0)
Alkaline Phosphatase: 64 U/L (ref 38–126)
BILIRUBIN DIRECT: 0.3 mg/dL (ref 0.1–0.5)
Indirect Bilirubin: 0.2 mg/dL — ABNORMAL LOW (ref 0.3–0.9)
Total Bilirubin: 0.5 mg/dL (ref 0.3–1.2)
Total Protein: 6.3 g/dL — ABNORMAL LOW (ref 6.5–8.1)

## 2015-01-26 LAB — CBC
HEMATOCRIT: 39.1 % (ref 36.0–46.0)
Hemoglobin: 12.8 g/dL (ref 12.0–15.0)
MCH: 31.4 pg (ref 26.0–34.0)
MCHC: 32.7 g/dL (ref 30.0–36.0)
MCV: 95.8 fL (ref 78.0–100.0)
PLATELETS: 241 10*3/uL (ref 150–400)
RBC: 4.08 MIL/uL (ref 3.87–5.11)
RDW: 13.8 % (ref 11.5–15.5)
WBC: 6 10*3/uL (ref 4.0–10.5)

## 2015-01-26 LAB — I-STAT TROPONIN, ED: TROPONIN I, POC: 0 ng/mL (ref 0.00–0.08)

## 2015-01-26 LAB — APTT: APTT: 30 s (ref 24–37)

## 2015-01-26 LAB — PROTIME-INR
INR: 1.8 — AB (ref 0.00–1.49)
Prothrombin Time: 20.9 seconds — ABNORMAL HIGH (ref 11.6–15.2)

## 2015-01-26 MED ORDER — SODIUM CHLORIDE 0.9 % IV BOLUS (SEPSIS)
500.0000 mL | Freq: Once | INTRAVENOUS | Status: AC
  Administered 2015-01-26: 500 mL via INTRAVENOUS

## 2015-01-26 NOTE — ED Notes (Signed)
Pt ambulatory w/ steady gait to restroom. Pt son here to transport home.

## 2015-01-26 NOTE — ED Notes (Signed)
PTAR CALLED @ [id number].

## 2015-01-26 NOTE — ED Notes (Signed)
Pt ambulatory w/ steady gait to restroom. 

## 2015-01-26 NOTE — Discharge Instructions (Signed)
Dehydration Dehydration means your body does not have as much fluid as it needs. Your kidneys, brain, and heart will not work properly without the right amount of fluids and salt. Older adults are more likely to become dehydrated than younger adults. This is because:   Their bodies do not hold water as well.  Their bodies do not respond to temperature changes as well.  They do not get thirsty as easily or as quickly. HOME CARE  Ask your doctor how to replace body fluid losses (rehydrate).  Drink enough fluids to keep your pee (urine) clear or pale yellow.  Drink small amounts of fluids often if you feel sick to your stomach (nauseous) or throw up (vomit).  Eat like you normally do.  Avoid:  Foods or drinks high in sugar.  Bubbly (carbonated) drinks.  Juice.  Very hot or cold fluids.  Drinks with caffeine.  Fatty, greasy foods.  Alcohol.  Tobacco.  Eating too much.  Gelatin desserts.  Wash your hands to avoid spreading germs (bacteria, viruses).  Only take medicine as told by your doctor.  Keep all doctor visits as told. GET HELP IF:  You have belly (abdominal) pain that gets worse or stays in one spot (localizes).  You have a rash, stiff neck, or bad headache.  You get easily annoyed, sleepy, or are hard to wake up.  You feel weak, dizzy, or very thirsty.  You have a fever. GET HELP RIGHT AWAY IF:   You cannot drink fluid without throwing up.  You get worse even with treatment.  You throw up often.  You have watery poop (diarrhea) often.  Your vomit has blood in it or looks greenish.  Your poop (stool) has blood in it or looks black and tarry.  You have not peed in 6 to 8 hours or have only peed a small amount of very dark pee.  You pass out (faint). MAKE SURE YOU:   Understand these instructions.  Will watch your condition.  Will get help right away if you are not doing well or get worse.   This information is not intended to replace  advice given to you by your health care provider. Make sure you discuss any questions you have with your health care provider.   Document Released: 03/14/2011 Document Revised: 03/30/2013 Document Reviewed: 11/30/2012 Elsevier Interactive Patient Education 2016 [place] Headache Without Cause A headache is pain or discomfort felt around the head or neck area. The specific cause of a headache may not be found. There are many causes and types of headaches. A few common ones are:  Tension headaches.  Migraine headaches.  Cluster headaches.  Chronic daily headaches. HOME CARE INSTRUCTIONS  Watch your condition for any changes. Take these steps to help with your condition: Managing Pain  Take over-the-counter and prescription medicines only as told by your health care provider.  Lie down in a dark, quiet room when you have a headache.  If directed, apply ice to the head and neck area:  Put ice in a plastic bag.  Place a towel between your skin and the bag.  Leave the ice on for 20 minutes, 2-3 times per day.  Use a heating pad or hot shower to apply heat to the head and neck area as told by your health care provider.  Keep lights dim if bright lights bother you or make your headaches worse. Eating and Drinking  Eat meals on a regular schedule.  Limit alcohol use.  Decrease the amount of caffeine you drink, or stop drinking caffeine. General Instructions  Keep all follow-up visits as told by your health care provider. This is important.  Keep a headache journal to help find out what may trigger your headaches. For example, write down:  What you eat and drink.  How much sleep you get.  Any change to your diet or medicines.  Try massage or other relaxation techniques.  Limit stress.  Sit up straight, and do not tense your muscles.  Do not use tobacco products, including cigarettes, chewing tobacco, or e-cigarettes. If you need help quitting, ask your  health care provider.  Exercise regularly as told by your health care provider.  Sleep on a regular schedule. Get 7-9 hours of sleep, or the amount recommended by your health care provider. SEEK MEDICAL CARE IF:   Your symptoms are not helped by medicine.  You have a headache that is different from the usual headache.  You have nausea or you vomit.  You have a fever. SEEK IMMEDIATE MEDICAL CARE IF:   Your headache becomes severe.  You have repeated vomiting.  You have a stiff neck.  You have a loss of vision.  You have problems with speech.  You have pain in the eye or ear.  You have muscular weakness or loss of muscle control.  You lose your balance or have trouble walking.  You feel faint or pass out.  You have confusion.   This information is not intended to replace advice given to you by your health care provider. Make sure you discuss any questions you have with your health care provider.   Document Released: 03/25/2005 Document Revised: 12/14/2014 Document Reviewed: 07/18/2014 Elsevier Interactive Patient Education [employer].

## 2015-01-26 NOTE — ED Notes (Signed)
PTAR CANCELED @ [id number].

## 2015-01-26 NOTE — ED Notes (Signed)
Called [facility] to update on pt returning to facility with discharge instructions.

## 2015-02-06 ENCOUNTER — Ambulatory Visit (INDEPENDENT_AMBULATORY_CARE_PROVIDER_SITE_OTHER): Payer: Medicare Other | Admitting: *Deleted

## 2015-02-06 DIAGNOSIS — I495 Sick sinus syndrome: Secondary | ICD-10-CM

## 2015-02-07 NOTE — Progress Notes (Signed)
Remote pacemaker transmission.   

## 2015-02-10 LAB — CUP PACEART REMOTE DEVICE CHECK
Battery Impedance: 100 Ohm
Battery Remaining Longevity: 152 mo
Battery Voltage: 2.8 V
Brady Statistic RV Percent Paced: 98 %
Implantable Lead Implant Date: 20090318
Implantable Lead Implant Date: 20090318
Implantable Lead Location: 753859
Implantable Lead Model: 5076
Lead Channel Impedance Value: 67 Ohm
Lead Channel Setting Pacing Pulse Width: 0.4 ms
Lead Channel Setting Sensing Sensitivity: 4 mV
MDC IDC LEAD LOCATION: 753860
MDC IDC MSMT LEADCHNL RV IMPEDANCE VALUE: 700 Ohm
MDC IDC MSMT LEADCHNL RV PACING THRESHOLD AMPLITUDE: 1 V
MDC IDC MSMT LEADCHNL RV PACING THRESHOLD PULSEWIDTH: 0.4 ms
MDC IDC SESS DTM: 20161031112157
MDC IDC SET LEADCHNL RV PACING AMPLITUDE: 2.5 V

## 2015-02-13 ENCOUNTER — Encounter: Payer: Self-pay | Admitting: Cardiology

## 2015-02-16 ENCOUNTER — Ambulatory Visit (INDEPENDENT_AMBULATORY_CARE_PROVIDER_SITE_OTHER): Payer: Medicare Other

## 2015-02-16 DIAGNOSIS — Z23 Encounter for immunization: Secondary | ICD-10-CM

## 2015-02-17 DIAGNOSIS — Z23 Encounter for immunization: Secondary | ICD-10-CM | POA: Diagnosis not present

## 2015-03-30 ENCOUNTER — Other Ambulatory Visit: Payer: Self-pay | Admitting: Internal Medicine

## 2015-04-06 ENCOUNTER — Other Ambulatory Visit: Payer: Self-pay | Admitting: Internal Medicine

## 2015-04-11 IMAGING — CR DG ELBOW COMPLETE 3+V*L*
4 series · 4 of 4 positions shown · non-contrast
Comparison: Contralateral flow

CLINICAL DATA: Fall, elbow pain

LEFT ELBOW - COMPLETE 3+ VIEW

[x elbow ap left]
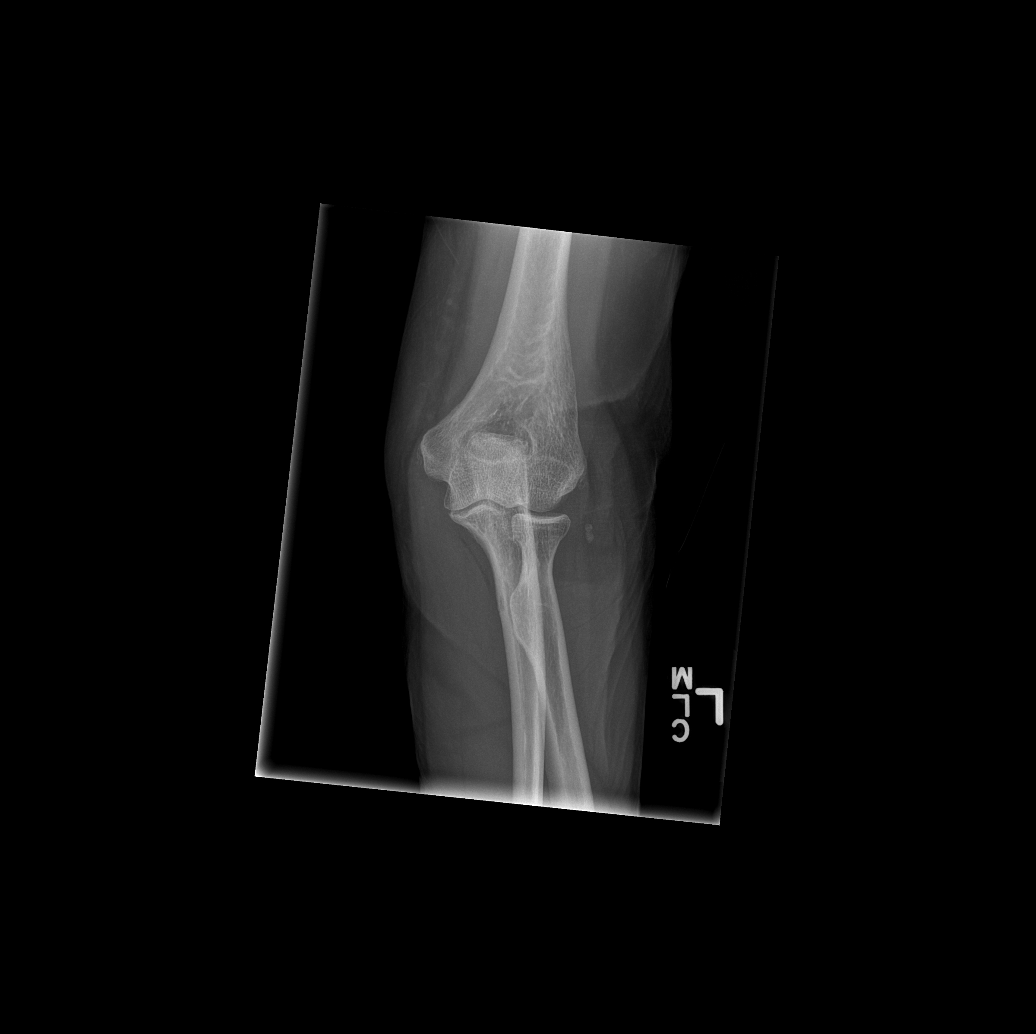

[x elbow obl left (1 of 2)]
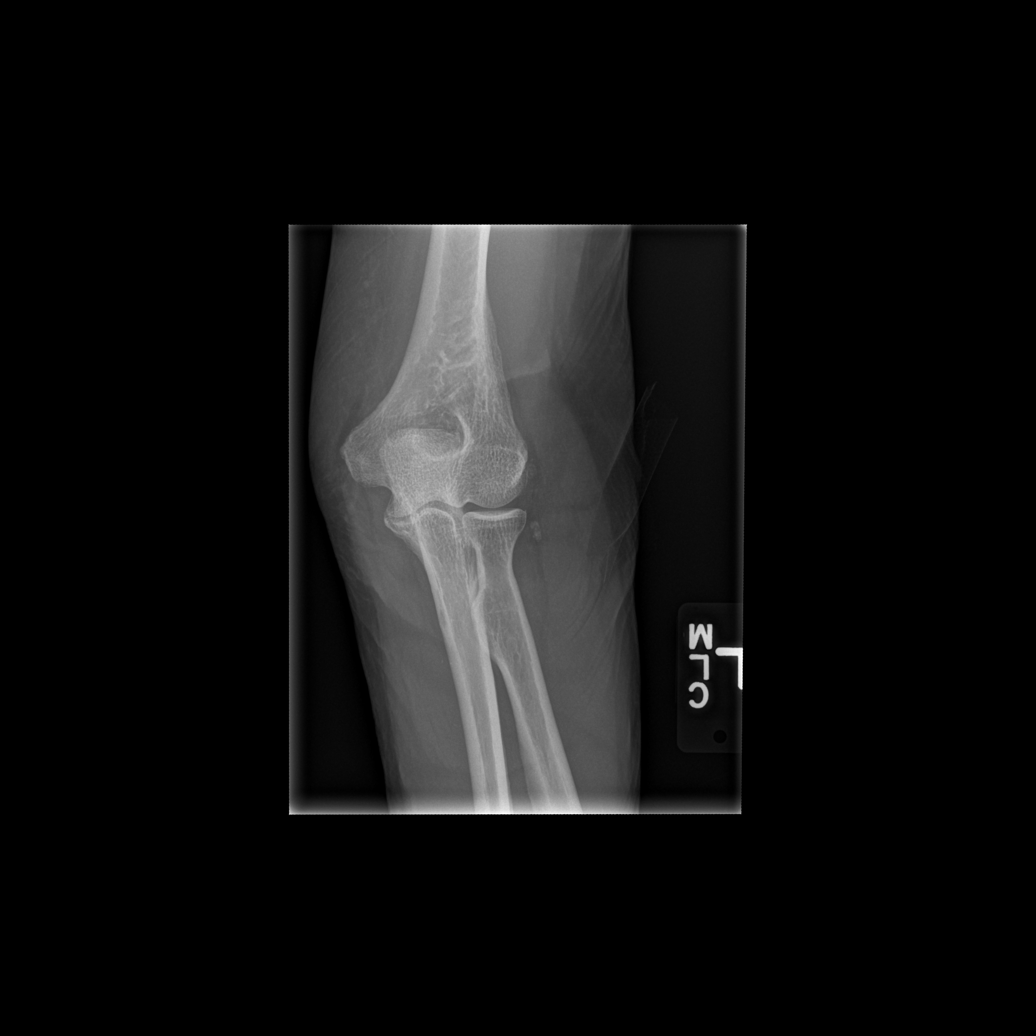

[x elbow obl left (2 of 2)]
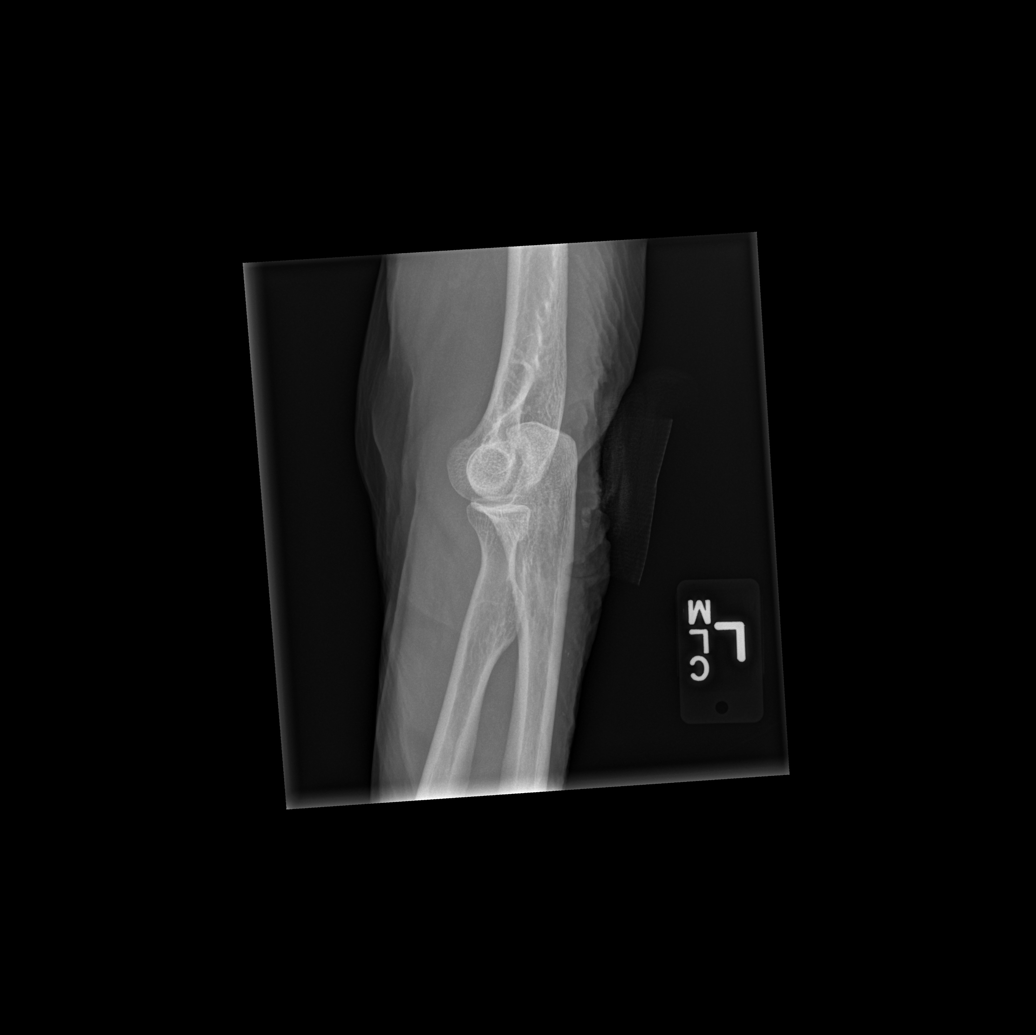

[x elbow lat left]
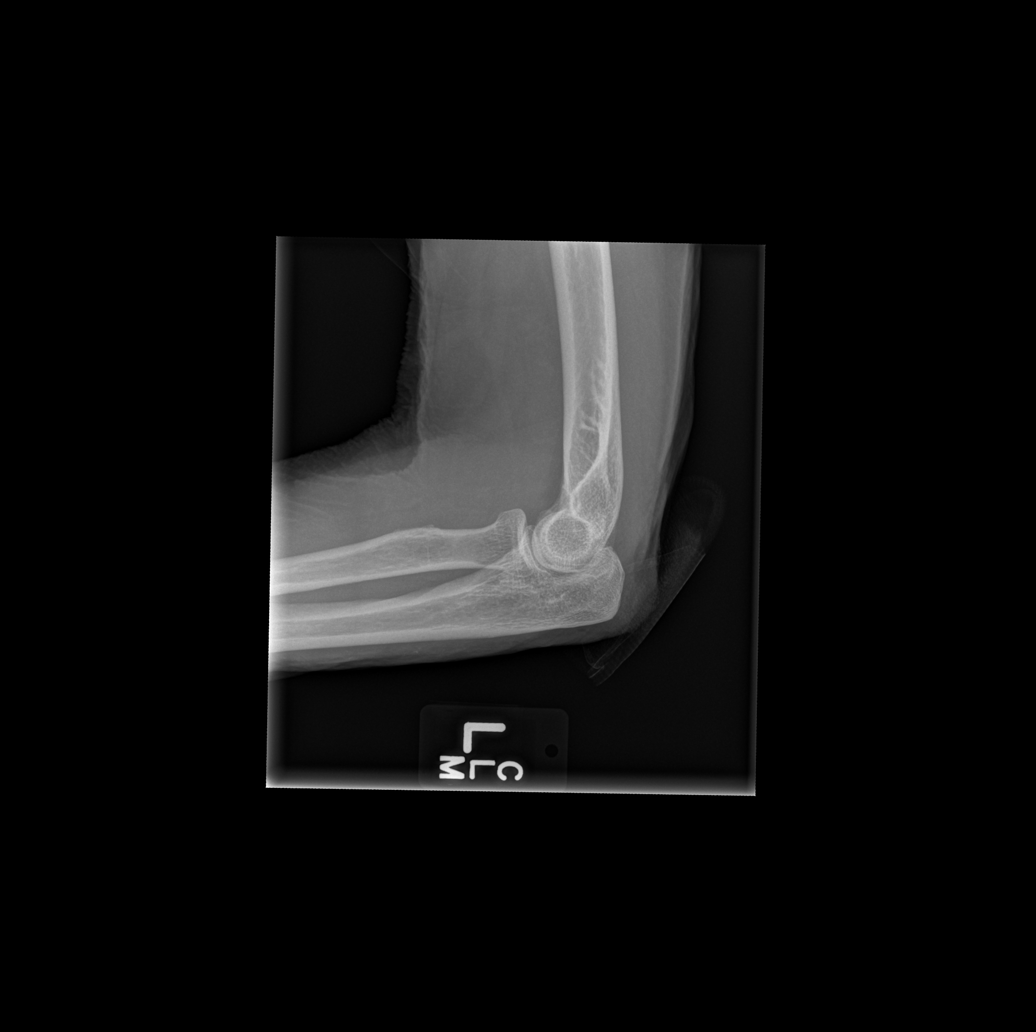

[4 of 4 positions shown; findings below may reference images not displayed]

FINDINGS: No fracture or dislocation.  No joint effusion.  No
aggressive osseous lesions. Bandage overlies the posterior
elbow/olecranon.
IMPRESSION: No acute osseous finding of the left elbow.

If clinical concern for a fracture persists, recommend a repeat
radiograph in 5-10 days to evaluate for interval change or callus
formation.

## 2015-04-18 DIAGNOSIS — J441 Chronic obstructive pulmonary disease with (acute) exacerbation: Secondary | ICD-10-CM | POA: Diagnosis not present

## 2015-04-18 DIAGNOSIS — I10 Essential (primary) hypertension: Secondary | ICD-10-CM | POA: Diagnosis not present

## 2015-04-19 ENCOUNTER — Telehealth: Payer: Self-pay | Admitting: Internal Medicine

## 2015-04-19 DIAGNOSIS — J209 Acute bronchitis, unspecified: Secondary | ICD-10-CM

## 2015-04-19 NOTE — Telephone Encounter (Signed)
lmtcb x1 for [place].

## 2015-04-20 ENCOUNTER — Ambulatory Visit (INDEPENDENT_AMBULATORY_CARE_PROVIDER_SITE_OTHER)
Admission: RE | Admit: 2015-04-20 | Discharge: 2015-04-20 | Disposition: A | Discharge status: Home / Self Care | Payer: Medicare Other | Source: Ambulatory Visit | Attending: Internal Medicine | Admitting: Internal Medicine

## 2015-04-20 DIAGNOSIS — J209 Acute bronchitis, unspecified: Secondary | ICD-10-CM | POA: Diagnosis not present

## 2015-04-20 NOTE — Telephone Encounter (Signed)
Spoke with pt, aware of recs.  cxr ordered.  Nothing further needed.  

## 2015-04-20 NOTE — Telephone Encounter (Signed)
Spoke with Mliss Sax at [facility] - requesting appt  Seen by PCP for URI 04/18/15 , given Zpak and Pred taper and Prednisone injection in office.  Still currently taking abx and Pred Using Albuterol frequently d/t increased SOB.  C/o cough, SOB and wheezing - no improvement. Mliss Sax is requesting OV with CXR today.  Please advise Dr [name] of any rec's or if you feel the patient needs to come in for CXR. Thanks.   Allergies  Allergen Reactions  . Brimonidine Tartrate-Timolol Other (See Comments)    REACTION: systemic malaise amigen eye drop  . Penicillins Hives     Medication List       This list is accurate as of: 04/19/15 11:59 PM.  Always use your most recent med list.               albuterol 108 (90 Base) MCG/ACT inhaler  Commonly known as:  PROVENTIL HFA;VENTOLIN HFA  Inhale 1-2 puffs into the lungs every 6 (six) hours as needed for wheezing or shortness of breath.     cholecalciferol 1000 units tablet  Commonly known as:  VITAMIN D  Take 1,000 Units by mouth daily.     fluticasone 50 MCG/ACT nasal spray  Commonly known as:  FLONASE  Place 2 sprays into the nose daily as needed for rhinitis.     furosemide 40 MG tablet  Commonly known as:  LASIX  Take 40 mg by mouth daily.     levothyroxine 88 MCG tablet  Commonly known as:  SYNTHROID, LEVOTHROID  Take 88 mcg by mouth daily before breakfast.     lisinopril 10 MG tablet  Commonly known as:  PRINIVIL,ZESTRIL  Take 10 mg by mouth daily.     Magnesium 250 MG Tabs  Take 1 tablet by mouth daily.     metoprolol succinate 50 MG 24 hr tablet  Commonly known as:  TOPROL-XL  Take 25 mg by mouth daily.     mometasone-formoterol 200-5 MCG/ACT Aero  Commonly known as:  DULERA  Inhale 2 puffs into the lungs 2 (two) times daily.     montelukast 10 MG tablet  Commonly known as:  SINGULAIR  Take 1 tablet by mouth  daily     potassium chloride SA 20 MEQ tablet  Commonly known as:  K-DUR,KLOR-CON  TAKE 1 TABLET  TWICE DAILY.     XARELTO 15 MG Tabs tablet  Generic drug:  Rivaroxaban  Take 1 tablet by mouth  daily with supper

## 2015-04-20 NOTE — Telephone Encounter (Signed)
Suggest finishing the antibiotic and prednisone.  No availability today in office.  Ok to come by for outpatient CXR  Dx acute bronchitis

## 2015-04-21 ENCOUNTER — Telehealth: Payer: Self-pay | Admitting: Internal Medicine

## 2015-04-21 NOTE — Progress Notes (Signed)
Quick Note:  Called and spoke with pt. Reviewed results and recs. Pt voiced understanding and had no further questions. ______ 

## 2015-04-21 NOTE — Telephone Encounter (Signed)
Notes Recorded by Deneise Lever, MD on 04/21/2015 at 2:42 PM CXR- heart enlarged and there is some extra fluid, but not in congestive heart failure.  Called and spoke with pt. Informed pt of cxr results and recs. Pt voiced understanding and had no further questions. Nothing further needed.

## 2015-05-08 ENCOUNTER — Ambulatory Visit (INDEPENDENT_AMBULATORY_CARE_PROVIDER_SITE_OTHER): Payer: Medicare Other | Admitting: *Deleted

## 2015-05-08 DIAGNOSIS — I495 Sick sinus syndrome: Secondary | ICD-10-CM

## 2015-05-08 NOTE — Progress Notes (Signed)
Remote pacemaker transmission.   

## 2015-05-15 LAB — CUP PACEART REMOTE DEVICE CHECK
Implantable Lead Implant Date: 20090318
Implantable Lead Model: 5092
Lead Channel Impedance Value: 700 Ohm
Lead Channel Pacing Threshold Amplitude: 1.25 V
Lead Channel Setting Pacing Amplitude: 2.5 V
MDC IDC LEAD IMPLANT DT: 20090318
MDC IDC LEAD LOCATION: 753859
MDC IDC LEAD LOCATION: 753860
MDC IDC MSMT BATTERY IMPEDANCE: 100 Ohm
MDC IDC MSMT BATTERY REMAINING LONGEVITY: 152 mo
MDC IDC MSMT BATTERY VOLTAGE: 2.8 V
MDC IDC MSMT LEADCHNL RA IMPEDANCE VALUE: 67 Ohm
MDC IDC MSMT LEADCHNL RV PACING THRESHOLD PULSEWIDTH: 0.4 ms
MDC IDC SESS DTM: 20170130143444
MDC IDC SET LEADCHNL RV PACING PULSEWIDTH: 0.4 ms
MDC IDC SET LEADCHNL RV SENSING SENSITIVITY: 4 mV
MDC IDC STAT BRADY RV PERCENT PACED: 97 %

## 2015-05-19 ENCOUNTER — Encounter: Payer: Self-pay | Admitting: Cardiology

## 2015-06-02 ENCOUNTER — Encounter: Payer: Self-pay | Admitting: Cardiology

## 2015-06-15 ENCOUNTER — Ambulatory Visit (INDEPENDENT_AMBULATORY_CARE_PROVIDER_SITE_OTHER): Payer: Medicare Other | Admitting: Internal Medicine

## 2015-06-15 ENCOUNTER — Encounter: Payer: Self-pay | Admitting: Internal Medicine

## 2015-06-15 ENCOUNTER — Ambulatory Visit (INDEPENDENT_AMBULATORY_CARE_PROVIDER_SITE_OTHER)
Admission: RE | Admit: 2015-06-15 | Discharge: 2015-06-15 | Disposition: A | Discharge status: Home / Self Care | Payer: Medicare Other | Source: Ambulatory Visit | Attending: Internal Medicine | Admitting: Internal Medicine

## 2015-06-15 ENCOUNTER — Other Ambulatory Visit (INDEPENDENT_AMBULATORY_CARE_PROVIDER_SITE_OTHER): Payer: Medicare Other

## 2015-06-15 VITALS — BP 124/68 | HR 90 | Ht 64.0 in | Wt 134.0 lb

## 2015-06-15 DIAGNOSIS — J441 Chronic obstructive pulmonary disease with (acute) exacerbation: Secondary | ICD-10-CM

## 2015-06-15 DIAGNOSIS — R0602 Shortness of breath: Secondary | ICD-10-CM | POA: Diagnosis not present

## 2015-06-15 DIAGNOSIS — J42 Unspecified chronic bronchitis: Secondary | ICD-10-CM

## 2015-06-15 DIAGNOSIS — J90 Pleural effusion, not elsewhere classified: Secondary | ICD-10-CM | POA: Diagnosis not present

## 2015-06-15 DIAGNOSIS — J948 Other specified pleural conditions: Secondary | ICD-10-CM

## 2015-06-15 DIAGNOSIS — I4891 Unspecified atrial fibrillation: Secondary | ICD-10-CM

## 2015-06-15 DIAGNOSIS — J811 Chronic pulmonary edema: Secondary | ICD-10-CM | POA: Diagnosis not present

## 2015-06-15 LAB — BRAIN NATRIURETIC PEPTIDE: PRO B NATRI PEPTIDE: 226 pg/mL — AB (ref 0.0–100.0)

## 2015-06-15 MED ORDER — FLUTICASONE FUROATE-VILANTEROL 100-25 MCG/INH IN AEPB: 1.0000 | INHALATION_SPRAY | Freq: Every day | RESPIRATORY_TRACT | Status: DC

## 2015-06-15 MED ORDER — METHYLPREDNISOLONE ACETATE 80 MG/ML IJ SUSP
80.0000 mg | Freq: Once | INTRAMUSCULAR | Status: AC
  Administered 2015-06-15: 80 mg via INTRAMUSCULAR

## 2015-06-15 MED ORDER — AZITHROMYCIN 250 MG PO TABS: ORAL_TABLET | ORAL | Status: DC

## 2015-06-15 MED ORDER — FLUTICASONE FUROATE-VILANTEROL 100-25 MCG/INH IN AEPB: INHALATION_SPRAY | RESPIRATORY_TRACT | Status: DC

## 2015-06-15 NOTE — Patient Instructions (Addendum)
Script senbt for Z pak  Instead of Dulera, try sample and printed script for Breo 100, Inhale 1 puff then rinse mouth, once daily  Depo 80       Dx chronic bronchitis exacerbation  Order- CXR     Dx Chronic bronchitis  Order- lab- BNP    Dx Atrial fibrillation

## 2015-06-15 NOTE — Progress Notes (Signed)
Subjective:    Patient ID: Jody Blake, female    DOB: 01/21/1926, 82 y.o.   MRN: [id number]  HPI 10/11/10- 67 yoF never smoker, followed for allergic rhinitis, chronic bronchitis, complicated by GERD, Hx PAfib/ pacemaker. Last here April 26, 2010. Note reviewed. She says the summer heat hasn't been so bad yet. She has to stop part way up hills or stairs to get her breath. Morning cough is normally. productive of clear phlegm. She walks daily and goes to fitness class twice weekly.  Should check on A1AT status next visit  06/27/11- 84 yoF never smoker, followed for allergic rhinitis, chronic bronchitis, complicated by GERD, Hx PAfib/ pacemaker. Acute visit-cough-productive-mostly clear and gets worse in the afternoon; streaks of blood (tiny amount) from time to time. Having SOB and wheezing as well 2 weeks ago exposed to cold wind outside. Sinus congestion cough malaise and weakness. Had congested with yellow nasal discharge but no headache. Chest feels wheezy with cough. Took one week of doxycycline and Mucinex DM. Denies fever, chills, pain.  06/26/12- 84 yoF never smoker, followed for allergic rhinitis, chronic bronchitis, complicated by GERD, Hx PAfib/ pacemaker. FOLLOWS FOR: SOB and wheezing at times-activity makes it worse; slight cough-productive-clear in color, congestion as well. One episode of winter bronchitis treated and resolved. Feels well now. Grew up in smoking household.  03/03/2013 Acute OV Complains of chest tightness, labored breathing, DOE, some prod cough with pale yellow mucus x3-4weeks.  denies f/c/s, hemoptysis, nausea, vomiting. Was on amiodarone few months ago briefly for Atrial Fib.  Now on Rythmol.  She denies any hemoptysis, orthopnea, PND. Does have chronic lower extremity swelling and is on Lasix. She denies any recent travel or antibiotic use. She has not taken any medications for her cough or congestion.  07/01/13- 84 yoF never smoker, followed for  allergic rhinitis, chronic bronchitis, complicated by GERD, Hx PAfib/ pacemaker. FOLLOWS FOR:  Gettting over the flu and has now turned into bronchitis.  Reports having cough with yellow mucus, wheezing and tightness in chest Treated for flu syndrome - doxy, few erythromycin, prednisone taper, Tamiflu. Much residual cough/ yellow, chest tight. Last abx 4 days ago. CXR 03/03/13 IMPRESSION:  1. There is hyperinflation consistent with known COPD. Stable  blunting of the posterior and lateral costophrenic angles on the  left is present.  2. There is no evidence of focal pneumonia nor evidence of CHF.  Electronically Signed  By: David Martinique  On: 03/03/2013 13:23  07/06/14- 88 yoF never smoker, followed for allergic rhinitis, chronic bronchitis, complicated by GERD, Hx PAfib/ pacemaker. FOLLOWS FOR: Pt stated her breathing has improved since last OV. pt stated she now has a pacemaker d/t afib, pt stated this has improved her breathing. Pt c/o mild cough with pale yellow mucus, PND and head congestion in morning. Pt denies CP/tightness. Mild dry cough.  06/15/2015-82 year old female never smoker followed for allergic rhinitis, chronic bronchitis, complicated by GERD, Hx PAFib/ pacemaker FOLLOWS FOR: Pt states she has awful lot of phlegm-no color. Denies any wheezing, SOB, or congestion. States it feels more bronchial than anything. Cough productive on first rising in the morning. Began with a sustained winter bronchitis pattern. Dependent ankle edema progressive through the day, sleeping on 2 pillows and using Lasix. Has small left pleural effusion suggesting volume overload as of chest x-ray in January. CXR 04/20/2015 IMPRESSION: 1. Small left pleural effusion with passive atelectasis. 2. Mild enlargement of the cardiopericardial silhouette, without edema. Dual lead pacer noted. 3. Atherosclerotic aortic  arch. 4. Thoracic spondylosis. Electronically Signed  By: Van Clines M.D.  On:  04/21/2015 08:20  ROS-see HPI   Negative unless "+" Constitutional:    weight loss, night sweats, fevers, chills, fatigue, lassitude. HEENT:    headaches, difficulty swallowing, tooth/dental problems, sore throat,       sneezing, itching, ear ache, nasal congestion, post nasal drip, snoring CV:    chest pain, orthopnea, PND, swelling in lower extremities, anasarca,                                                     dizziness, palpitations Resp:   shortness of breath with exertion or at rest.                productive cough,   +non-productive cough, coughing up of blood.              change in color of mucus.  wheezing.   Skin:    rash or lesions. GI:  No-   heartburn, indigestion, abdominal pain, nausea, vomiting,  GU:  MS:   joint pain, stiffness,  Neuro-     nothing unusual Psych:  change in mood or affect.  depression or anxiety.   memory loss.  Objective:  OBJ- Physical Exam General- Alert, Oriented, Affect-appropriate, Distress- none acute Skin- rash-none, lesions- none, excoriation- none Lymphadenopathy- none Head- atraumatic            Eyes- Gross vision intact, PERRLA, conjunctivae and secretions clear            Ears- Hearing, canals-normal            Nose- Clear, no-Septal dev, mucus, polyps, erosion, perforation             Throat- Mallampati II , mucosa clear , drainage- none, tonsils- atrophic Neck- flexible , trachea midline, no stridor , thyroid nl, carotid no bruit Chest - symmetrical excursion , unlabored           Heart/CV- RRR , no murmur , no gallop  , no rub, nl s1 s2                           - JVD- none , edema- none, stasis changes- none, varices- none           Lung- clear to P&A, wheeze- none, cough+light , dullness + slight left base, rub- none           Chest wall- left pacemaker Abd-  Br/ Gen/ Rectal- Not done, not indicated Extrem- cyanosis- none, clubbing, none, atrophy- none, strength- nl Neuro- grossly intact to observatio    Assessment & Plan:

## 2015-06-16 ENCOUNTER — Telehealth: Payer: Self-pay | Admitting: Internal Medicine

## 2015-06-17 DIAGNOSIS — J90 Pleural effusion, not elsewhere classified: Secondary | ICD-10-CM | POA: Insufficient documentation

## 2015-06-17 NOTE — Assessment & Plan Note (Signed)
Probably cardiogenic transudate fluid Plan-chest x-ray, BNP

## 2015-06-17 NOTE — Assessment & Plan Note (Signed)
Suspect post viral bronchitis Plan-change to [facility] because of insurance change. Chest x-ray, Z-Pak, Depo-Medrol

## 2015-06-19 NOTE — Telephone Encounter (Signed)
Patient calling back to get CXR results:  Notes Recorded by Deneise Lever, MD on 06/15/2015 at 3:37 PM CXR- improved. Heart remains enlarged, but without congestive heart failure. The pleural effusion fluid collection noted on the last xray is improved  Patient notified. Nothing further needed.

## 2015-06-26 ENCOUNTER — Telehealth: Payer: Self-pay | Admitting: Internal Medicine

## 2015-06-26 NOTE — Telephone Encounter (Signed)
lmtcb X1 for pt. I see no pending results in Epic.

## 2015-06-27 NOTE — Telephone Encounter (Signed)
Patient notified of results. Nothing further needed.  

## 2015-07-06 ENCOUNTER — Ambulatory Visit: Payer: Medicare Other | Admitting: Internal Medicine

## 2015-07-10 ENCOUNTER — Telehealth: Payer: Self-pay | Admitting: Internal Medicine

## 2015-07-10 MED ORDER — FLUTICASONE FUROATE-VILANTEROL 100-25 MCG/INH IN AEPB
1.0000 | INHALATION_SPRAY | Freq: Every day | RESPIRATORY_TRACT | Status: DC
Start: 2015-07-10 — End: 2015-09-22

## 2015-07-10 NOTE — Telephone Encounter (Signed)
Rx refill sent to [facility]. Patient aware. Nothing further needed.

## 2015-07-20 ENCOUNTER — Other Ambulatory Visit: Payer: Self-pay | Admitting: Internal Medicine

## 2015-07-21 ENCOUNTER — Other Ambulatory Visit: Payer: Self-pay | Admitting: Internal Medicine

## 2015-07-24 ENCOUNTER — Other Ambulatory Visit: Payer: Self-pay

## 2015-07-24 MED ORDER — RIVAROXABAN 15 MG PO TABS: ORAL_TABLET | ORAL | Status: DC

## 2015-08-02 DIAGNOSIS — I480 Paroxysmal atrial fibrillation: Secondary | ICD-10-CM | POA: Diagnosis not present

## 2015-08-02 DIAGNOSIS — I10 Essential (primary) hypertension: Secondary | ICD-10-CM | POA: Diagnosis not present

## 2015-08-02 DIAGNOSIS — G4489 Other headache syndrome: Secondary | ICD-10-CM | POA: Diagnosis not present

## 2015-08-02 DIAGNOSIS — Z79899 Other long term (current) drug therapy: Secondary | ICD-10-CM | POA: Diagnosis not present

## 2015-08-21 ENCOUNTER — Encounter: Payer: Self-pay | Admitting: Internal Medicine

## 2015-08-21 ENCOUNTER — Ambulatory Visit (INDEPENDENT_AMBULATORY_CARE_PROVIDER_SITE_OTHER): Payer: Medicare Other | Admitting: Internal Medicine

## 2015-08-21 VITALS — BP 146/86 | HR 89 | Ht 65.0 in | Wt 133.0 lb

## 2015-08-21 DIAGNOSIS — I442 Atrioventricular block, complete: Secondary | ICD-10-CM | POA: Diagnosis not present

## 2015-08-21 DIAGNOSIS — I10 Essential (primary) hypertension: Secondary | ICD-10-CM | POA: Diagnosis not present

## 2015-08-21 DIAGNOSIS — I495 Sick sinus syndrome: Secondary | ICD-10-CM | POA: Diagnosis not present

## 2015-08-21 MED ORDER — LISINOPRIL 10 MG PO TABS: 10.0000 mg | ORAL_TABLET | Freq: Two times a day (BID) | ORAL | Status: DC

## 2015-08-21 NOTE — Patient Instructions (Signed)
Medication Instructions:  Your physician has recommended you make the following change in your medication:  1) Stop Metoprolol 2) Increase Lisinopril to 10 mg twice daily   Labwork:  None ordered   Testing/Procedures: None ordered   Follow-Up: Your physician wants you to follow-up in: 6 months with Dr Marlou Porch and 12 months with Dr Vallery Ridge will receive a reminder letter in the mail two months in advance. If you don't receive a letter, please call our office to schedule the follow-up appointment.  Remote monitoring is used to monitor your Pacemaker  from home. This monitoring reduces the number of office visits required to check your device to one time per year. It allows [place] to keep an eye on the functioning of your device to ensure it is working properly. You are scheduled for a device check from home on 11/20/15. You may send your transmission at any time that day. If you have a wireless device, the transmission will be sent automatically. After your physician reviews your transmission, you will receive a postcard with your next transmission date.     Any Other Special Instructions Will Be Listed Below (If Applicable).     If you need a refill on your cardiac medications before your next appointment, please call your pharmacy.

## 2015-08-21 NOTE — Progress Notes (Signed)
Electrophysiology Office Note   Date:  08/21/2015   ID:  DARSHANA BROOMFIELD, DOB 04-Sep-1925, MRN [id number]  PCP:  Mathews Argyle, MD  Cardiologist:  Dr Marlou Porch Primary Electrophysiologist: Thompson Grayer, MD    Chief Complaint  Patient presents with  . Shortness of Breath     History of Present Illness: Jody Blake is a 82 y.o. female who presents today for electrophysiology evaluation.   She is doing very well s/p AV nodal ablation.  Her SOB is improved.  Her edema is "much better".  Today, she denies symptoms of palpitations, chest pain, orthopnea, PND,  claudication, dizziness, presyncope, syncope, bleeding, or neurologic sequela. The patient is tolerating medications without difficulties and is otherwise without complaint today.    Past Medical History  Diagnosis Date  . Hypertension   . Diverticulosis   . Asthmatic bronchitis   . Pure hypercholesterolemia   . Macular degeneration     Dr. [name]  . Aortic stenosis     a. Echo 09/06/12 EF 55-60%, no WMA, G2DD, Ao valve sclerosis w/ mod stenosis, LA mildly dilated, PA pressure 65mmHg  . COPD (chronic obstructive pulmonary disease) (HCC)     Dr. [name]  . PAF (paroxysmal atrial fibrillation) (HCC)     Stopped flecainide, on amiodarone but still has bouts of A FIB (mostly in mornings).   . Complete heart block [facility])     s/p permanent pacemaker 06/27/1999 (Battery change 06/2007 and 2016).  s/p AV nodal ablation by Dr [name] 2016.  [name] Hypothyroidism     on medication  . Anemia   . DVT (deep vein thrombosis) in pregnancy 1954    a. LLE  . Diastolic dysfunction     a. Echo 09/06/12 EF 55-60%, no WMA, G2DD, Ao valve sclerosis w/ mod stenosis, LA mildly dilated, PA pressure 4mmHg  . Osteoarthrosis, unspecified whether generalized or localized, other specified sites   . DDD (degenerative disc disease)   . Staghorn calculus     Left   Past Surgical History  Procedure Laterality Date  . Partial nephrectomy Left 05/1974   stone disease  . Bunionectomy with hammertoe reconstruction Bilateral ~ 1990  . Total knee arthroplasty Right 2001  . Incisional hernia repair    . Cataract extraction w/ intraocular lens  implant, bilateral Bilateral   . Hernia repair    . Esophagogastroduodenoscopy (egd) with propofol N/A 04/22/2013    Procedure: ESOPHAGOGASTRODUODENOSCOPY (EGD) WITH PROPOFOL;  Surgeon: [name], MD;  Location: [facility] ENDOSCOPY;  Service: Endoscopy;  Laterality: N/A;  . Colonoscopy with propofol N/A 04/22/2013    Procedure: COLONOSCOPY WITH PROPOFOL;  Surgeon: [name], MD;  Location: [facility] ENDOSCOPY;  Service: Endoscopy;  Laterality: N/A;  . Tonsillectomy and adenoidectomy  1930's  . Appendectomy  ~ 1941  . Joint replacement    . Dilation and curettage of uterus  X 2    "when I was going thru menopause"  . Cardioversion N/A 12/02/2013    Procedure: CARDIOVERSION;  Surgeon: [name], MD;  Location: [place];  Service: Cardiovascular;  Laterality: N/A;  . Av node ablation  05/10/2014  . Cardiac pacemaker placement  06/27/99    Medtronic PM implanted by Dr [name]  . Insert / replace / remove pacemaker  06/2007    "took out the old; put in new"  . Insert / replace / remove pacemaker  05/10/2014    MDT PPM generator change by Dr [name]  . Permanent pacemaker generator change N/A 05/10/2014  Procedure: PERMANENT PACEMAKER GENERATOR CHANGE;  Surgeon: Thompson Grayer, MD;  Location: [facility] CATH LAB;  Service: Cardiovascular;  Laterality: N/A;  . Av node ablation N/A 05/10/2014    Procedure: AV NODE ABLATION;  Surgeon: Thompson Grayer, MD;  Location: [facility] CATH LAB;  Service: Cardiovascular;  Laterality: N/A;     Current Outpatient Prescriptions  Medication Sig Dispense Refill  . albuterol (PROVENTIL HFA;VENTOLIN HFA) 108 (90 BASE) MCG/ACT inhaler Inhale 1-2 puffs into the lungs every 6 (six) hours as needed for wheezing or shortness of breath.    . cholecalciferol (VITAMIN D) 1000 UNITS tablet Take 1,000  Units by mouth daily.    . fluticasone (FLONASE) 50 MCG/ACT nasal spray Place 2 sprays into the nose daily as needed for rhinitis.    . fluticasone furoate-vilanterol (BREO ELLIPTA) 100-25 MCG/INH AEPB Inhale 1 puff into the lungs daily. 3 each 1  . furosemide (LASIX) 40 MG tablet Take 40 mg by mouth daily.    [name] levothyroxine (SYNTHROID, LEVOTHROID) 88 MCG tablet Take 88 mcg by mouth daily before breakfast.    . lisinopril (PRINIVIL,ZESTRIL) 10 MG tablet Take 10 mg by mouth daily.    . Magnesium 250 MG TABS Take 1 tablet by mouth daily.    . metoprolol succinate (TOPROL-XL) 50 MG 24 hr tablet TAKE 1 TABLET ONCE DAILY WITH OR IMMEDIATELY FOLLOWING A MEAL. 30 tablet 1  . montelukast (SINGULAIR) 10 MG tablet Take 1 tablet by mouth  daily 90 tablet 2  . potassium chloride SA (K-DUR,KLOR-CON) 20 MEQ tablet TAKE 1 TABLET TWICE DAILY. 180 tablet 2  . Rivaroxaban (XARELTO) 15 MG TABS tablet Take 1 tablet by mouth  daily with supper 90 tablet 0   No current facility-administered medications for this visit.    Allergies:   Brimonidine tartrate-timolol and Penicillins   Social History:  The patient  reports that she has never smoked. She has never used smokeless tobacco. She reports that she drinks about 4.2 oz of alcohol per week. She reports that she does not use illicit drugs.   Family History:  The patient's family history includes Breast cancer in her mother; Heart attack in her brother; Hypertension in her father; Malignant hypertension in her father; Renal Disease in her father; Stroke in her brother.    ROS:  Please see the history of present illness.   All other systems are reviewed and negative.    PHYSICAL EXAM: VS:  BP 146/86 mmHg  Pulse 89  Ht 5\' 5"  (1.651 m)  Wt 133 lb (60.328 kg)  BMI 22.13 kg/m2  LMP 04/08/1974 , BMI Body mass index is 22.13 kg/(m^2). GEN: Well nourished, well developed, in no acute distress HEENT: normal Neck: no JVD, carotid bruits, or masses Cardiac: RRR  (paced); no murmurs, rubs, or gallops, +1 edema  Respiratory:  clear to auscultation bilaterally, normal work of breathing GI: soft, nontender, nondistended, + BS MS: no deformity or atrophy Skin: warm and dry, device pocket is well healed  Neuro:  Strength and sensation are intact Psych: euthymic mood, full affect  Device interrogation is reviewed today in detail.  See PaceArt for details.  ekg reveals afib with V pacing today  Recent Labs: 01/25/2015: ALT 16; BUN 29*; Creatinine, Ser 1.20*; Hemoglobin 12.8; Platelets 241; Potassium 3.6; Sodium 136 06/15/2015: Pro B Natriuretic peptide (BNP) 226.0*    Lipid Panel  No results found for: CHOL, TRIG, HDL, CHOLHDL, VLDL, LDLCALC, LDLDIRECT   Wt Readings from Last 3 Encounters:  08/21/15 133 lb (60.328  kg)  06/15/15 134 lb (60.782 kg)  12/29/14 135 lb (61.236 kg)     ASSESSMENT AND PLAN:  1.  Permanent afib Continue long term anticoagulation chads2vasc score is at least 4 Stop metoprolol today  2. HTN Increase lisinopril to 10mg  BID  3. Complete heart block Doing very well s/p AV nodal ablation Normal pacemaker function See Pace Art report   Remote monitoring (My Carelink Smart) Follow-up with Dr Marlou Porch in 6 months  Follow-up: I will see in 12 months  Signed, Thompson Grayer, MD  08/21/2015 12:43 PM     [facility] [address] [place] [address] [phone] (office) [phone] (fax)

## 2015-08-22 NOTE — Addendum Note (Signed)
Addended by: Freada Bergeron on: 08/22/2015 04:33 PM   Modules accepted: Orders

## 2015-09-05 DIAGNOSIS — H40053 Ocular hypertension, bilateral: Secondary | ICD-10-CM | POA: Insufficient documentation

## 2015-09-05 DIAGNOSIS — D3132 Benign neoplasm of left choroid: Secondary | ICD-10-CM | POA: Diagnosis not present

## 2015-09-05 DIAGNOSIS — Z961 Presence of intraocular lens: Secondary | ICD-10-CM | POA: Insufficient documentation

## 2015-09-05 DIAGNOSIS — H353130 Nonexudative age-related macular degeneration, bilateral, stage unspecified: Secondary | ICD-10-CM | POA: Diagnosis not present

## 2015-09-11 DIAGNOSIS — M5136 Other intervertebral disc degeneration, lumbar region: Secondary | ICD-10-CM | POA: Diagnosis not present

## 2015-09-11 DIAGNOSIS — M461 Sacroiliitis, not elsewhere classified: Secondary | ICD-10-CM | POA: Diagnosis not present

## 2015-09-11 DIAGNOSIS — M4126 Other idiopathic scoliosis, lumbar region: Secondary | ICD-10-CM | POA: Diagnosis not present

## 2015-09-11 DIAGNOSIS — M9905 Segmental and somatic dysfunction of pelvic region: Secondary | ICD-10-CM | POA: Diagnosis not present

## 2015-09-11 DIAGNOSIS — M9904 Segmental and somatic dysfunction of sacral region: Secondary | ICD-10-CM | POA: Diagnosis not present

## 2015-09-11 DIAGNOSIS — M9903 Segmental and somatic dysfunction of lumbar region: Secondary | ICD-10-CM | POA: Diagnosis not present

## 2015-09-11 DIAGNOSIS — Q72812 Congenital shortening of left lower limb: Secondary | ICD-10-CM | POA: Diagnosis not present

## 2015-09-13 DIAGNOSIS — Q72812 Congenital shortening of left lower limb: Secondary | ICD-10-CM | POA: Diagnosis not present

## 2015-09-13 DIAGNOSIS — M9904 Segmental and somatic dysfunction of sacral region: Secondary | ICD-10-CM | POA: Diagnosis not present

## 2015-09-13 DIAGNOSIS — M9905 Segmental and somatic dysfunction of pelvic region: Secondary | ICD-10-CM | POA: Diagnosis not present

## 2015-09-13 DIAGNOSIS — M461 Sacroiliitis, not elsewhere classified: Secondary | ICD-10-CM | POA: Diagnosis not present

## 2015-09-13 DIAGNOSIS — M5136 Other intervertebral disc degeneration, lumbar region: Secondary | ICD-10-CM | POA: Diagnosis not present

## 2015-09-13 DIAGNOSIS — M9903 Segmental and somatic dysfunction of lumbar region: Secondary | ICD-10-CM | POA: Diagnosis not present

## 2015-09-13 DIAGNOSIS — M4126 Other idiopathic scoliosis, lumbar region: Secondary | ICD-10-CM | POA: Diagnosis not present

## 2015-09-15 DIAGNOSIS — M9904 Segmental and somatic dysfunction of sacral region: Secondary | ICD-10-CM | POA: Diagnosis not present

## 2015-09-15 DIAGNOSIS — M461 Sacroiliitis, not elsewhere classified: Secondary | ICD-10-CM | POA: Diagnosis not present

## 2015-09-15 DIAGNOSIS — M5136 Other intervertebral disc degeneration, lumbar region: Secondary | ICD-10-CM | POA: Diagnosis not present

## 2015-09-15 DIAGNOSIS — M9903 Segmental and somatic dysfunction of lumbar region: Secondary | ICD-10-CM | POA: Diagnosis not present

## 2015-09-15 DIAGNOSIS — M4126 Other idiopathic scoliosis, lumbar region: Secondary | ICD-10-CM | POA: Diagnosis not present

## 2015-09-15 DIAGNOSIS — M9905 Segmental and somatic dysfunction of pelvic region: Secondary | ICD-10-CM | POA: Diagnosis not present

## 2015-09-15 DIAGNOSIS — Q72812 Congenital shortening of left lower limb: Secondary | ICD-10-CM | POA: Diagnosis not present

## 2015-09-18 DIAGNOSIS — Q72812 Congenital shortening of left lower limb: Secondary | ICD-10-CM | POA: Diagnosis not present

## 2015-09-18 DIAGNOSIS — M4126 Other idiopathic scoliosis, lumbar region: Secondary | ICD-10-CM | POA: Diagnosis not present

## 2015-09-18 DIAGNOSIS — M9903 Segmental and somatic dysfunction of lumbar region: Secondary | ICD-10-CM | POA: Diagnosis not present

## 2015-09-18 DIAGNOSIS — M461 Sacroiliitis, not elsewhere classified: Secondary | ICD-10-CM | POA: Diagnosis not present

## 2015-09-18 DIAGNOSIS — M5136 Other intervertebral disc degeneration, lumbar region: Secondary | ICD-10-CM | POA: Diagnosis not present

## 2015-09-18 DIAGNOSIS — M9904 Segmental and somatic dysfunction of sacral region: Secondary | ICD-10-CM | POA: Diagnosis not present

## 2015-09-18 DIAGNOSIS — M9905 Segmental and somatic dysfunction of pelvic region: Secondary | ICD-10-CM | POA: Diagnosis not present

## 2015-09-19 DIAGNOSIS — M4126 Other idiopathic scoliosis, lumbar region: Secondary | ICD-10-CM | POA: Diagnosis not present

## 2015-09-19 DIAGNOSIS — M9904 Segmental and somatic dysfunction of sacral region: Secondary | ICD-10-CM | POA: Diagnosis not present

## 2015-09-19 DIAGNOSIS — M5136 Other intervertebral disc degeneration, lumbar region: Secondary | ICD-10-CM | POA: Diagnosis not present

## 2015-09-19 DIAGNOSIS — M461 Sacroiliitis, not elsewhere classified: Secondary | ICD-10-CM | POA: Diagnosis not present

## 2015-09-19 DIAGNOSIS — M9905 Segmental and somatic dysfunction of pelvic region: Secondary | ICD-10-CM | POA: Diagnosis not present

## 2015-09-19 DIAGNOSIS — Q72812 Congenital shortening of left lower limb: Secondary | ICD-10-CM | POA: Diagnosis not present

## 2015-09-19 DIAGNOSIS — M9903 Segmental and somatic dysfunction of lumbar region: Secondary | ICD-10-CM | POA: Diagnosis not present

## 2015-09-20 DIAGNOSIS — Z961 Presence of intraocular lens: Secondary | ICD-10-CM | POA: Diagnosis not present

## 2015-09-20 DIAGNOSIS — H401132 Primary open-angle glaucoma, bilateral, moderate stage: Secondary | ICD-10-CM | POA: Diagnosis not present

## 2015-09-20 DIAGNOSIS — Z0100 Encounter for examination of eyes and vision without abnormal findings: Secondary | ICD-10-CM | POA: Diagnosis not present

## 2015-09-21 DIAGNOSIS — M4126 Other idiopathic scoliosis, lumbar region: Secondary | ICD-10-CM | POA: Diagnosis not present

## 2015-09-21 DIAGNOSIS — M9904 Segmental and somatic dysfunction of sacral region: Secondary | ICD-10-CM | POA: Diagnosis not present

## 2015-09-21 DIAGNOSIS — Q72812 Congenital shortening of left lower limb: Secondary | ICD-10-CM | POA: Diagnosis not present

## 2015-09-21 DIAGNOSIS — M461 Sacroiliitis, not elsewhere classified: Secondary | ICD-10-CM | POA: Diagnosis not present

## 2015-09-21 DIAGNOSIS — M9903 Segmental and somatic dysfunction of lumbar region: Secondary | ICD-10-CM | POA: Diagnosis not present

## 2015-09-21 DIAGNOSIS — M5136 Other intervertebral disc degeneration, lumbar region: Secondary | ICD-10-CM | POA: Diagnosis not present

## 2015-09-21 DIAGNOSIS — M9905 Segmental and somatic dysfunction of pelvic region: Secondary | ICD-10-CM | POA: Diagnosis not present

## 2015-09-22 ENCOUNTER — Other Ambulatory Visit: Payer: Self-pay | Admitting: *Deleted

## 2015-09-22 MED ORDER — FLUTICASONE FUROATE-VILANTEROL 100-25 MCG/INH IN AEPB
1.0000 | INHALATION_SPRAY | Freq: Every day | RESPIRATORY_TRACT | Status: DC
Start: 2015-09-22 — End: 2015-11-06

## 2015-09-25 DIAGNOSIS — M461 Sacroiliitis, not elsewhere classified: Secondary | ICD-10-CM | POA: Diagnosis not present

## 2015-09-25 DIAGNOSIS — M4126 Other idiopathic scoliosis, lumbar region: Secondary | ICD-10-CM | POA: Diagnosis not present

## 2015-09-25 DIAGNOSIS — M5136 Other intervertebral disc degeneration, lumbar region: Secondary | ICD-10-CM | POA: Diagnosis not present

## 2015-09-25 DIAGNOSIS — Q72812 Congenital shortening of left lower limb: Secondary | ICD-10-CM | POA: Diagnosis not present

## 2015-09-25 DIAGNOSIS — M9904 Segmental and somatic dysfunction of sacral region: Secondary | ICD-10-CM | POA: Diagnosis not present

## 2015-09-25 DIAGNOSIS — M9903 Segmental and somatic dysfunction of lumbar region: Secondary | ICD-10-CM | POA: Diagnosis not present

## 2015-09-25 DIAGNOSIS — M9905 Segmental and somatic dysfunction of pelvic region: Secondary | ICD-10-CM | POA: Diagnosis not present

## 2015-09-27 ENCOUNTER — Telehealth: Payer: Self-pay | Admitting: Internal Medicine

## 2015-09-27 NOTE — Telephone Encounter (Addendum)
Returned call to nurse.  She has already received orders from patients PCP in regards to her BP.  I explained to her that I did not feel we should start making changes as PCP has already addressed.  They are going to have her take her Lisinopril all together one dose of 20 mg every morning and monitor her BP for 48 hours.  I explained to RN the her last OV her BP was 146/86 HR 89 and she was in Afib. Today her HR was 82.  It is just her BP that is a problem and the PCP is adjusting medications.  Patient did admit while I was on the phone with RN that she had taken an extra Furosemide yesterday which most likely contributed to her one low reading last evening of 95/32 and her feeling tired today.  This morning before she took her medication her BP was 168/110, she took the Lisinopril 20mg  and at 8am it was 158/102, then by 9:15 it had come down to 129/77 with a HR of 82.  I explained to RN that is it continues to fluctuate she will need to call back to the PCP and discuss as they have already given her orders as what to do.  She verbalized understanding and will call us back if her HR's become an issue.

## 2015-09-27 NOTE — Telephone Encounter (Signed)
New message     [facility] nurse is requesting a callback from the nurse.  She says it is complicated and want to talk to the nurse

## 2015-10-02 DIAGNOSIS — M9903 Segmental and somatic dysfunction of lumbar region: Secondary | ICD-10-CM | POA: Diagnosis not present

## 2015-10-02 DIAGNOSIS — M9904 Segmental and somatic dysfunction of sacral region: Secondary | ICD-10-CM | POA: Diagnosis not present

## 2015-10-02 DIAGNOSIS — M9905 Segmental and somatic dysfunction of pelvic region: Secondary | ICD-10-CM | POA: Diagnosis not present

## 2015-10-02 DIAGNOSIS — M5136 Other intervertebral disc degeneration, lumbar region: Secondary | ICD-10-CM | POA: Diagnosis not present

## 2015-10-02 DIAGNOSIS — M4126 Other idiopathic scoliosis, lumbar region: Secondary | ICD-10-CM | POA: Diagnosis not present

## 2015-10-02 DIAGNOSIS — Q72812 Congenital shortening of left lower limb: Secondary | ICD-10-CM | POA: Diagnosis not present

## 2015-10-02 DIAGNOSIS — M461 Sacroiliitis, not elsewhere classified: Secondary | ICD-10-CM | POA: Diagnosis not present

## 2015-10-03 DIAGNOSIS — Q72812 Congenital shortening of left lower limb: Secondary | ICD-10-CM | POA: Diagnosis not present

## 2015-10-03 DIAGNOSIS — M9905 Segmental and somatic dysfunction of pelvic region: Secondary | ICD-10-CM | POA: Diagnosis not present

## 2015-10-03 DIAGNOSIS — M461 Sacroiliitis, not elsewhere classified: Secondary | ICD-10-CM | POA: Diagnosis not present

## 2015-10-03 DIAGNOSIS — M9903 Segmental and somatic dysfunction of lumbar region: Secondary | ICD-10-CM | POA: Diagnosis not present

## 2015-10-03 DIAGNOSIS — M4126 Other idiopathic scoliosis, lumbar region: Secondary | ICD-10-CM | POA: Diagnosis not present

## 2015-10-03 DIAGNOSIS — M5136 Other intervertebral disc degeneration, lumbar region: Secondary | ICD-10-CM | POA: Diagnosis not present

## 2015-10-03 DIAGNOSIS — M9904 Segmental and somatic dysfunction of sacral region: Secondary | ICD-10-CM | POA: Diagnosis not present

## 2015-10-04 DIAGNOSIS — I10 Essential (primary) hypertension: Secondary | ICD-10-CM | POA: Diagnosis not present

## 2015-10-05 DIAGNOSIS — M9903 Segmental and somatic dysfunction of lumbar region: Secondary | ICD-10-CM | POA: Diagnosis not present

## 2015-10-05 DIAGNOSIS — M9905 Segmental and somatic dysfunction of pelvic region: Secondary | ICD-10-CM | POA: Diagnosis not present

## 2015-10-05 DIAGNOSIS — Q72812 Congenital shortening of left lower limb: Secondary | ICD-10-CM | POA: Diagnosis not present

## 2015-10-05 DIAGNOSIS — M461 Sacroiliitis, not elsewhere classified: Secondary | ICD-10-CM | POA: Diagnosis not present

## 2015-10-05 DIAGNOSIS — M9904 Segmental and somatic dysfunction of sacral region: Secondary | ICD-10-CM | POA: Diagnosis not present

## 2015-10-05 DIAGNOSIS — M4126 Other idiopathic scoliosis, lumbar region: Secondary | ICD-10-CM | POA: Diagnosis not present

## 2015-10-05 DIAGNOSIS — M5136 Other intervertebral disc degeneration, lumbar region: Secondary | ICD-10-CM | POA: Diagnosis not present

## 2015-10-09 ENCOUNTER — Other Ambulatory Visit: Payer: Self-pay | Admitting: Internal Medicine

## 2015-10-09 DIAGNOSIS — M9904 Segmental and somatic dysfunction of sacral region: Secondary | ICD-10-CM | POA: Diagnosis not present

## 2015-10-09 DIAGNOSIS — Q72812 Congenital shortening of left lower limb: Secondary | ICD-10-CM | POA: Diagnosis not present

## 2015-10-09 DIAGNOSIS — M4126 Other idiopathic scoliosis, lumbar region: Secondary | ICD-10-CM | POA: Diagnosis not present

## 2015-10-09 DIAGNOSIS — M461 Sacroiliitis, not elsewhere classified: Secondary | ICD-10-CM | POA: Diagnosis not present

## 2015-10-09 DIAGNOSIS — M9903 Segmental and somatic dysfunction of lumbar region: Secondary | ICD-10-CM | POA: Diagnosis not present

## 2015-10-09 DIAGNOSIS — M9905 Segmental and somatic dysfunction of pelvic region: Secondary | ICD-10-CM | POA: Diagnosis not present

## 2015-10-09 DIAGNOSIS — M5136 Other intervertebral disc degeneration, lumbar region: Secondary | ICD-10-CM | POA: Diagnosis not present

## 2015-10-12 DIAGNOSIS — M9903 Segmental and somatic dysfunction of lumbar region: Secondary | ICD-10-CM | POA: Diagnosis not present

## 2015-10-12 DIAGNOSIS — M5136 Other intervertebral disc degeneration, lumbar region: Secondary | ICD-10-CM | POA: Diagnosis not present

## 2015-10-12 DIAGNOSIS — M461 Sacroiliitis, not elsewhere classified: Secondary | ICD-10-CM | POA: Diagnosis not present

## 2015-10-12 DIAGNOSIS — M9904 Segmental and somatic dysfunction of sacral region: Secondary | ICD-10-CM | POA: Diagnosis not present

## 2015-10-12 DIAGNOSIS — M9905 Segmental and somatic dysfunction of pelvic region: Secondary | ICD-10-CM | POA: Diagnosis not present

## 2015-10-12 DIAGNOSIS — M4126 Other idiopathic scoliosis, lumbar region: Secondary | ICD-10-CM | POA: Diagnosis not present

## 2015-10-12 DIAGNOSIS — Q72812 Congenital shortening of left lower limb: Secondary | ICD-10-CM | POA: Diagnosis not present

## 2015-10-16 DIAGNOSIS — M4126 Other idiopathic scoliosis, lumbar region: Secondary | ICD-10-CM | POA: Diagnosis not present

## 2015-10-16 DIAGNOSIS — Q72812 Congenital shortening of left lower limb: Secondary | ICD-10-CM | POA: Diagnosis not present

## 2015-10-16 DIAGNOSIS — M9903 Segmental and somatic dysfunction of lumbar region: Secondary | ICD-10-CM | POA: Diagnosis not present

## 2015-10-16 DIAGNOSIS — I480 Paroxysmal atrial fibrillation: Secondary | ICD-10-CM | POA: Diagnosis not present

## 2015-10-16 DIAGNOSIS — M9904 Segmental and somatic dysfunction of sacral region: Secondary | ICD-10-CM | POA: Diagnosis not present

## 2015-10-16 DIAGNOSIS — I10 Essential (primary) hypertension: Secondary | ICD-10-CM | POA: Diagnosis not present

## 2015-10-16 DIAGNOSIS — M461 Sacroiliitis, not elsewhere classified: Secondary | ICD-10-CM | POA: Diagnosis not present

## 2015-10-16 DIAGNOSIS — M5136 Other intervertebral disc degeneration, lumbar region: Secondary | ICD-10-CM | POA: Diagnosis not present

## 2015-10-16 DIAGNOSIS — M9905 Segmental and somatic dysfunction of pelvic region: Secondary | ICD-10-CM | POA: Diagnosis not present

## 2015-10-19 DIAGNOSIS — Q72812 Congenital shortening of left lower limb: Secondary | ICD-10-CM | POA: Diagnosis not present

## 2015-10-19 DIAGNOSIS — M9904 Segmental and somatic dysfunction of sacral region: Secondary | ICD-10-CM | POA: Diagnosis not present

## 2015-10-19 DIAGNOSIS — M9903 Segmental and somatic dysfunction of lumbar region: Secondary | ICD-10-CM | POA: Diagnosis not present

## 2015-10-19 DIAGNOSIS — M5136 Other intervertebral disc degeneration, lumbar region: Secondary | ICD-10-CM | POA: Diagnosis not present

## 2015-10-19 DIAGNOSIS — M9905 Segmental and somatic dysfunction of pelvic region: Secondary | ICD-10-CM | POA: Diagnosis not present

## 2015-10-19 DIAGNOSIS — M4126 Other idiopathic scoliosis, lumbar region: Secondary | ICD-10-CM | POA: Diagnosis not present

## 2015-10-19 DIAGNOSIS — M461 Sacroiliitis, not elsewhere classified: Secondary | ICD-10-CM | POA: Diagnosis not present

## 2015-10-23 DIAGNOSIS — M9904 Segmental and somatic dysfunction of sacral region: Secondary | ICD-10-CM | POA: Diagnosis not present

## 2015-10-23 DIAGNOSIS — Q72812 Congenital shortening of left lower limb: Secondary | ICD-10-CM | POA: Diagnosis not present

## 2015-10-23 DIAGNOSIS — M9905 Segmental and somatic dysfunction of pelvic region: Secondary | ICD-10-CM | POA: Diagnosis not present

## 2015-10-23 DIAGNOSIS — M461 Sacroiliitis, not elsewhere classified: Secondary | ICD-10-CM | POA: Diagnosis not present

## 2015-10-23 DIAGNOSIS — M5136 Other intervertebral disc degeneration, lumbar region: Secondary | ICD-10-CM | POA: Diagnosis not present

## 2015-10-23 DIAGNOSIS — M9903 Segmental and somatic dysfunction of lumbar region: Secondary | ICD-10-CM | POA: Diagnosis not present

## 2015-10-23 DIAGNOSIS — M4126 Other idiopathic scoliosis, lumbar region: Secondary | ICD-10-CM | POA: Diagnosis not present

## 2015-10-26 DIAGNOSIS — Q72812 Congenital shortening of left lower limb: Secondary | ICD-10-CM | POA: Diagnosis not present

## 2015-10-26 DIAGNOSIS — M9905 Segmental and somatic dysfunction of pelvic region: Secondary | ICD-10-CM | POA: Diagnosis not present

## 2015-10-26 DIAGNOSIS — M461 Sacroiliitis, not elsewhere classified: Secondary | ICD-10-CM | POA: Diagnosis not present

## 2015-10-26 DIAGNOSIS — M5136 Other intervertebral disc degeneration, lumbar region: Secondary | ICD-10-CM | POA: Diagnosis not present

## 2015-10-26 DIAGNOSIS — M9903 Segmental and somatic dysfunction of lumbar region: Secondary | ICD-10-CM | POA: Diagnosis not present

## 2015-10-26 DIAGNOSIS — M9904 Segmental and somatic dysfunction of sacral region: Secondary | ICD-10-CM | POA: Diagnosis not present

## 2015-10-26 DIAGNOSIS — M4126 Other idiopathic scoliosis, lumbar region: Secondary | ICD-10-CM | POA: Diagnosis not present

## 2015-11-01 DIAGNOSIS — M9905 Segmental and somatic dysfunction of pelvic region: Secondary | ICD-10-CM | POA: Diagnosis not present

## 2015-11-01 DIAGNOSIS — M461 Sacroiliitis, not elsewhere classified: Secondary | ICD-10-CM | POA: Diagnosis not present

## 2015-11-01 DIAGNOSIS — M4126 Other idiopathic scoliosis, lumbar region: Secondary | ICD-10-CM | POA: Diagnosis not present

## 2015-11-01 DIAGNOSIS — M5136 Other intervertebral disc degeneration, lumbar region: Secondary | ICD-10-CM | POA: Diagnosis not present

## 2015-11-01 DIAGNOSIS — Q72812 Congenital shortening of left lower limb: Secondary | ICD-10-CM | POA: Diagnosis not present

## 2015-11-01 DIAGNOSIS — M9903 Segmental and somatic dysfunction of lumbar region: Secondary | ICD-10-CM | POA: Diagnosis not present

## 2015-11-01 DIAGNOSIS — M9904 Segmental and somatic dysfunction of sacral region: Secondary | ICD-10-CM | POA: Diagnosis not present

## 2015-11-08 ENCOUNTER — Encounter: Payer: Self-pay | Admitting: Internal Medicine

## 2015-11-08 ENCOUNTER — Non-Acute Institutional Stay: Payer: Medicare Other | Admitting: Internal Medicine

## 2015-11-08 VITALS — BP 112/68 | HR 62 | Temp 98.5°F | Ht 62.0 in | Wt 135.0 lb

## 2015-11-08 DIAGNOSIS — I5032 Chronic diastolic (congestive) heart failure: Secondary | ICD-10-CM | POA: Diagnosis not present

## 2015-11-08 DIAGNOSIS — M9903 Segmental and somatic dysfunction of lumbar region: Secondary | ICD-10-CM | POA: Diagnosis not present

## 2015-11-08 DIAGNOSIS — I482 Chronic atrial fibrillation: Secondary | ICD-10-CM | POA: Diagnosis not present

## 2015-11-08 DIAGNOSIS — J449 Chronic obstructive pulmonary disease, unspecified: Secondary | ICD-10-CM | POA: Diagnosis not present

## 2015-11-08 DIAGNOSIS — Z7901 Long term (current) use of anticoagulants: Secondary | ICD-10-CM

## 2015-11-08 DIAGNOSIS — I10 Essential (primary) hypertension: Secondary | ICD-10-CM

## 2015-11-08 DIAGNOSIS — J4489 Other specified chronic obstructive pulmonary disease: Secondary | ICD-10-CM

## 2015-11-08 DIAGNOSIS — I4821 Permanent atrial fibrillation: Secondary | ICD-10-CM

## 2015-11-08 DIAGNOSIS — M4126 Other idiopathic scoliosis, lumbar region: Secondary | ICD-10-CM | POA: Diagnosis not present

## 2015-11-08 DIAGNOSIS — Q72812 Congenital shortening of left lower limb: Secondary | ICD-10-CM | POA: Diagnosis not present

## 2015-11-08 DIAGNOSIS — E039 Hypothyroidism, unspecified: Secondary | ICD-10-CM | POA: Diagnosis not present

## 2015-11-08 DIAGNOSIS — M9904 Segmental and somatic dysfunction of sacral region: Secondary | ICD-10-CM | POA: Diagnosis not present

## 2015-11-08 DIAGNOSIS — I442 Atrioventricular block, complete: Secondary | ICD-10-CM | POA: Diagnosis not present

## 2015-11-08 DIAGNOSIS — M5136 Other intervertebral disc degeneration, lumbar region: Secondary | ICD-10-CM | POA: Diagnosis not present

## 2015-11-08 DIAGNOSIS — H353130 Nonexudative age-related macular degeneration, bilateral, stage unspecified: Secondary | ICD-10-CM | POA: Diagnosis not present

## 2015-11-08 DIAGNOSIS — M9905 Segmental and somatic dysfunction of pelvic region: Secondary | ICD-10-CM | POA: Diagnosis not present

## 2015-11-08 DIAGNOSIS — M461 Sacroiliitis, not elsewhere classified: Secondary | ICD-10-CM | POA: Diagnosis not present

## 2015-11-08 NOTE — Progress Notes (Signed)
Provider:  Rexene Edison. Mariea Clonts, D.O., C.M.D. Location:   [facility]    Place of Service:   Clinic  Previous PCP: Mathews Argyle, MD Patient Care Team: Lajean Manes, MD as PCP - General (Internal Medicine) Thompson Grayer, MD as Consulting Physician (Cardiology) Jerline Pain, MD as Consulting Physician (Cardiology) Deneise Lever, MD as Consulting Physician (Pulmonary Disease) Rutherford Guys, MD as Consulting Physician (Ophthalmology)  Extended Emergency Contact Information Primary Emergency Contact: [name] of [place] Phone: [phone] Mobile Phone: [phone] Relation: Son Secondary Emergency Contact: Hunt,Cheryl  [place] Mobile Phone: [phone] Relation: Daughter  Code Status: DNR Goals of Care: Advanced Directive information Advanced Directives 11/08/2015  Does patient have an advance directive? Yes  Type of Advance Directive -  Does patient want to make changes to advanced directive? -  Copy of advanced directive(s) in chart? No - copy requested  Would patient like information on creating an advanced directive? -  Pre-existing out of facility DNR order (yellow form or pink MOST form) -    Chief Complaint  Patient presents with  . Establish Care    new patient    HPI: Patient is a 82 y.o. female seen today to establish with [facility].  Records have been requested from Dr. Lajean Manes.    Had her complete heart block and has pacemaker--made her feel much better.  Has chronic bronchitis/emphysema/COPD.  Had been using breo, and didn't realize it was increasing her BP.  Vision was blurry and called for nurse.  BP was 225/130.  She also now has glaucoma. She has stopped her breo about 6 wks ago.  She needs something instead.  Has not yet discussed it with Dr. [name].  She previously was on [place] [employer] stopped covering).  Before that, she took spiriva.  She did fine on those.    Stress is going ok as far  as her husband and his dementia.    Has had difficulty with CHF also.    Has been on same dose of synthroid for a couple of years.  Made her feel much better when it was started.    On xarelto (had been on pradaxa before) for at least a couple of years for chronic afib.  Sees. Dr. [name] and Dr. Marlou Porch.  Past Medical History:  Diagnosis Date  . Anemia   . Aortic stenosis    a. Echo 09/06/12 EF 55-60%, no WMA, G2DD, Ao valve sclerosis w/ mod stenosis, LA mildly dilated, PA pressure 57mmHg  . Asthmatic bronchitis   . Complete heart block [facility])    s/p permanent pacemaker 06/27/1999 (Battery change 06/2007 and 2016).  s/p AV nodal ablation by Dr [name] 2016.  [name] COPD (chronic obstructive pulmonary disease) (HCC)    Dr. [name]  . DDD (degenerative disc disease)   . Diastolic dysfunction    a. Echo 09/06/12 EF 55-60%, no WMA, G2DD, Ao valve sclerosis w/ mod stenosis, LA mildly dilated, PA pressure 53mmHg  . Diverticulosis   . DVT (deep vein thrombosis) in pregnancy 1954   a. LLE  . Hypertension   . Hypothyroidism    on medication  . Macular degeneration    Dr. [name]  . Osteoarthrosis, unspecified whether generalized or localized, other specified sites   . PAF (paroxysmal atrial fibrillation) (HCC)    Stopped flecainide, on amiodarone but still has bouts of A FIB (mostly in mornings).   . Pure hypercholesterolemia   . Staghorn calculus  Left   Past Surgical History:  Procedure Laterality Date  . APPENDECTOMY  ~ 1941  . AV NODE ABLATION  05/10/2014  . AV NODE ABLATION N/A 05/10/2014   Procedure: AV NODE ABLATION;  Surgeon: Thompson Grayer, MD;  Location[facility] CATH LAB;  Service: Cardiovascular;  Laterality: N/A;  . BUNIONECTOMY WITH HAMMERTOE RECONSTRUCTION Bilateral ~ 1990  . CARDIAC PACEMAKER PLACEMENT  06/27/99   Medtronic PM implanted by Dr [name]  . CARDIOVERSION N/A 12/02/2013   Procedure: CARDIOVERSION;  Surgeon: [name], MD;  Location: [facility] ENDOSCOPY;  Service: Cardiovascular;   Laterality: N/A;  . CATARACT EXTRACTION W/ INTRAOCULAR LENS  IMPLANT, BILATERAL Bilateral   . COLONOSCOPY WITH PROPOFOL N/A 04/22/2013   Procedure: COLONOSCOPY WITH PROPOFOL;  Surgeon: [name], MD;  Location: [facility] ENDOSCOPY;  Service: Endoscopy;  Laterality: N/A;  . DILATION AND CURETTAGE OF UTERUS  X 2   "when I was going thru menopause"  . ESOPHAGOGASTRODUODENOSCOPY (EGD) WITH PROPOFOL N/A 04/22/2013   Procedure: ESOPHAGOGASTRODUODENOSCOPY (EGD) WITH PROPOFOL;  Surgeon: [name], MD;  Location: [facility] ENDOSCOPY;  Service: Endoscopy;  Laterality: N/A;  . HERNIA REPAIR    . INCISIONAL HERNIA REPAIR    . INSERT / REPLACE / REMOVE PACEMAKER  06/2007   "took out the old; put in new"  . INSERT / REPLACE / REMOVE PACEMAKER  05/10/2014   MDT PPM generator change by Dr [name]  . JOINT REPLACEMENT    . PARTIAL NEPHRECTOMY Left 05/1974   stone disease  . PERMANENT PACEMAKER GENERATOR CHANGE N/A 05/10/2014   Procedure: PERMANENT PACEMAKER GENERATOR CHANGE;  Surgeon: Thompson Grayer, MD;  Location: [facility] CATH LAB;  Service: Cardiovascular;  Laterality: N/A;  . TONSILLECTOMY AND ADENOIDECTOMY  1930's  . TOTAL KNEE ARTHROPLASTY Right 2001    reports that she has never smoked. She has never used smokeless tobacco. She reports that she drinks about 4.2 oz of alcohol per week . She reports that she does not use drugs.   Family History  Problem Relation Age of Onset  . Malignant hypertension Father   . Hypertension Father   . Renal Disease Father   . Breast cancer Mother   . Heart attack Brother   . Stroke Brother     Health Maintenance  Topic Date Due  . Samul Dada  02/15/1947  . ZOSTAVAX  02/15/1988  . DEXA SCAN  02/14/1993  . PNA vac Low Risk Adult (2 of 2 - PCV13) 11/24/2014  . INFLUENZA VACCINE  11/07/2015    Allergies  Allergen Reactions  . Brimonidine Tartrate-Timolol Other (See Comments)    REACTION: systemic malaise amigen eye drop  . Penicillins Hives  . Breo Ellipta  [Fluticasone Furoate-Vilanterol] Other (See Comments)    Ran blood pressure up      Medication List       Accurate as of 11/08/15  1:34 PM. Always use your most recent med list.          furosemide 40 MG tablet Commonly known as:  LASIX Take 40 mg by mouth daily.   levothyroxine 88 MCG tablet Commonly known as:  SYNTHROID, LEVOTHROID Take 88 mcg by mouth daily before breakfast.   lisinopril 20 MG tablet Commonly known as:  PRINIVIL,ZESTRIL Take 20 mg by mouth daily.   Magnesium 250 MG Tabs Take 1 tablet by mouth daily.   metoprolol 50 MG tablet Commonly known as:  LOPRESSOR Take 50 mg by mouth daily.   montelukast 10 MG tablet Commonly known as:  SINGULAIR  Take 1 tablet by mouth  daily   potassium chloride SA 20 MEQ tablet Commonly known as:  K-DUR,KLOR-CON TAKE 1 TABLET TWICE DAILY.   PRESERVISION AREDS 2 Caps Take 1 capsule by mouth daily.   Vitamin D 2000 units Caps Take 1 capsule by mouth daily.   XARELTO 15 MG Tabs tablet Generic drug:  Rivaroxaban Take 1 tablet by mouth  daily with supper       Review of Systems  Constitutional: Negative for chills, fever and malaise/fatigue.  HENT: Positive for hearing loss.        Hearing aides  Eyes: Negative for blurred vision.       Stable now with macular and some elevated pressure that did not meet criteria for glaucoma  Respiratory: Positive for cough. Negative for sputum production, shortness of breath and wheezing.   Cardiovascular: Positive for PND. Negative for chest pain, palpitations and leg swelling.       Left leg always swells more than right (had DVT when pregnant 62 some years ago)  Gastrointestinal: Negative for abdominal pain, blood in stool, constipation, diarrhea and melena.  Genitourinary: Negative for dysuria, frequency and urgency.       Previously had some leakage but doing kegels has helped  Musculoskeletal: Negative for back pain, falls, joint pain and myalgias.  Skin: Negative for  itching and rash.  Neurological: Negative for dizziness, sensory change, loss of consciousness and weakness.  Endo/Heme/Allergies: Bruises/bleeds easily.  Psychiatric/Behavioral: Negative for depression and memory loss. The patient is not nervous/anxious and does not have insomnia.     Vitals:   11/08/15 1323  BP: 112/68  Pulse: 62  Temp: 98.5 F (36.9 C)  TempSrc: Oral  SpO2: 97%  Weight: 135 lb (61.2 kg)  Height: 5\' 2"  (1.575 m)   Body mass index is 24.69 kg/m. Physical Exam  Constitutional: She is oriented to person, place, and time. She appears well-developed and well-nourished. No distress.  Cardiovascular: Normal rate, regular rhythm, normal heart sounds and intact distal pulses.   Pulmonary/Chest: Effort normal. No respiratory distress. She has wheezes.  Wheezing cleared after cough  Abdominal: Soft. Bowel sounds are normal.  Musculoskeletal: Normal range of motion.  Ambulates w/o assistive device  Neurological: She is alert and oriented to person, place, and time.  Skin: Skin is warm and dry.  Psychiatric: She has a normal mood and affect.    Labs reviewed: Basic Metabolic Panel:  Recent Labs  01/25/15 2350  NA 136  K 3.6  CL 99*  CO2 28  GLUCOSE 107*  BUN 29*  CREATININE 1.20*  CALCIUM 9.3   Liver Function Tests:  Recent Labs  01/25/15 2350  AST 31  ALT 16  ALKPHOS 64  BILITOT 0.5  PROT 6.3*  ALBUMIN 3.6   No results for input(s): LIPASE, AMYLASE in the last 8760 hours. No results for input(s): AMMONIA in the last 8760 hours. CBC:  Recent Labs  01/25/15 2350  WBC 6.0  HGB 12.8  HCT 39.1  MCV 95.8  PLT 241   Cardiac Enzymes: No results for input(s): CKTOTAL, CKMB, CKMBINDEX, TROPONINI in the last 8760 hours. BNP: Invalid input(s): POCBNP No results found for: HGBA1C Lab Results  Component Value Date   TSH 44.98 (H) 04/12/2013   No results found for: VITAMINB12 No results found for: FOLATE No results found for: IRON, TIBC,  FERRITIN  Imaging and Procedures noted on new patient packet: 18 in EPIC Need Dr. Carlyle Lipa records for remainder Reports last mammo and  bone density about 6 years ago  Assessment/Plan 1. Complete heart block ([place]) -resolved with pacemaker placement in 2016 with Dr. [name]  2. Permanent atrial fibrillation (HCC) -on chronic xarelto -cardizem caused leg swelling for her -continue metoprolol succinate for rate control  3. COPD with chronic bronchitis ([place]) -had been doing well from the pulmonary standpoint with breo, but BP and ocular pressure were elevated for her so she stopped it -advised to check with Dr. [name] about whether he prefers she go back on dulera or spiriva instead  4. Essential hypertension -bp at goal today w/o dizziness since stopping her breo -cont metoprolol succinate, lisinopril  5. Age-related macular degeneration, dry, both eyes -follows with Dr. Gershon Crane  6. Long term (current) use of anticoagulants -continues on xarelto for her chronic afib, but also has had a DVT during pregnancy many years ago  18. Hypothyroidism, unspecified hypothyroidism type -cont levothyroxine 7mcg daily -need to see when labs last done by Dr. Felipa Eth  8.  Diastolic chf -pt reports some difficulty recently descriminating whether she had effusions and pulmonary edema vs copd exacerbation  -is on lasix and potassium daily and edema improved, no rales today or JVD  Labs/tests ordered:  No new today--need records first  [place] [address] L. Elham Fini, D.O. [facility] [address], [place] [address] Cell Phone (Mon-Fri 8am-5pm):  [phone] On Call:  [phone] & follow prompts after 5pm & weekends Office Phone:  [phone] Office Fax:  [phone]

## 2015-11-16 DIAGNOSIS — H401131 Primary open-angle glaucoma, bilateral, mild stage: Secondary | ICD-10-CM | POA: Diagnosis not present

## 2015-11-20 ENCOUNTER — Ambulatory Visit (INDEPENDENT_AMBULATORY_CARE_PROVIDER_SITE_OTHER): Payer: Medicare Other | Admitting: *Deleted

## 2015-11-20 ENCOUNTER — Telehealth: Payer: Self-pay | Admitting: Cardiology

## 2015-11-20 DIAGNOSIS — I442 Atrioventricular block, complete: Secondary | ICD-10-CM

## 2015-11-20 DIAGNOSIS — I495 Sick sinus syndrome: Secondary | ICD-10-CM

## 2015-11-20 NOTE — Telephone Encounter (Signed)
LMOVM reminding pt to send remote transmission.   

## 2015-11-20 NOTE — Progress Notes (Signed)
Remote pacemaker transmission.   

## 2015-11-21 ENCOUNTER — Other Ambulatory Visit: Payer: Self-pay | Admitting: *Deleted

## 2015-11-21 MED ORDER — FUROSEMIDE 40 MG PO TABS: 40.0000 mg | ORAL_TABLET | Freq: Every day | ORAL | 1 refills | Status: DC

## 2015-11-21 NOTE — Telephone Encounter (Signed)
[facility] 

## 2015-11-22 ENCOUNTER — Encounter: Payer: Self-pay | Admitting: Cardiology

## 2015-11-22 DIAGNOSIS — M9905 Segmental and somatic dysfunction of pelvic region: Secondary | ICD-10-CM | POA: Diagnosis not present

## 2015-11-22 DIAGNOSIS — M4126 Other idiopathic scoliosis, lumbar region: Secondary | ICD-10-CM | POA: Diagnosis not present

## 2015-11-22 DIAGNOSIS — Q72812 Congenital shortening of left lower limb: Secondary | ICD-10-CM | POA: Diagnosis not present

## 2015-11-22 DIAGNOSIS — M461 Sacroiliitis, not elsewhere classified: Secondary | ICD-10-CM | POA: Diagnosis not present

## 2015-11-22 DIAGNOSIS — M9904 Segmental and somatic dysfunction of sacral region: Secondary | ICD-10-CM | POA: Diagnosis not present

## 2015-11-22 DIAGNOSIS — M9903 Segmental and somatic dysfunction of lumbar region: Secondary | ICD-10-CM | POA: Diagnosis not present

## 2015-11-22 DIAGNOSIS — M5136 Other intervertebral disc degeneration, lumbar region: Secondary | ICD-10-CM | POA: Diagnosis not present

## 2015-11-28 LAB — CUP PACEART REMOTE DEVICE CHECK
Battery Impedance: 111 Ohm
Brady Statistic RV Percent Paced: 99 %
Date Time Interrogation Session: 20170814155246
Implantable Lead Implant Date: 20090318
Implantable Lead Location: 753859
Lead Channel Impedance Value: 67 Ohm
Lead Channel Setting Pacing Pulse Width: 0.4 ms
Lead Channel Setting Sensing Sensitivity: 4 mV
MDC IDC LEAD IMPLANT DT: 20090318
MDC IDC LEAD LOCATION: 753860
MDC IDC MSMT BATTERY REMAINING LONGEVITY: 149 mo
MDC IDC MSMT BATTERY VOLTAGE: 2.8 V
MDC IDC MSMT LEADCHNL RV IMPEDANCE VALUE: 716 Ohm
MDC IDC MSMT LEADCHNL RV PACING THRESHOLD AMPLITUDE: 1 V
MDC IDC MSMT LEADCHNL RV PACING THRESHOLD PULSEWIDTH: 0.4 ms
MDC IDC SET LEADCHNL RV PACING AMPLITUDE: 2.5 V

## 2015-12-15 ENCOUNTER — Telehealth: Payer: Self-pay | Admitting: Internal Medicine

## 2015-12-15 NOTE — Telephone Encounter (Signed)
Called spoke with pt. appt scheduled with TP. Nothing further needed

## 2015-12-15 NOTE — Telephone Encounter (Signed)
Suggest she see next available NP

## 2015-12-15 NOTE — Telephone Encounter (Signed)
Called and spoke to pt. Pt states she is no longer taking Breo as instructed by MD at [facility] because the medication increased her CVP to 235/140 and worsened her glaucoma. When pt was taken off of Breo her BP and glaucoma improved. Pt is requesting an appt with CY, first opening is early October, pt is not wanting to wait that long. Pt last seen in 06/2015.   Dr. [name] please advise, if pt can be worked in. Thanks.   Allergies  Allergen Reactions  . Brimonidine Tartrate-Timolol Other (See Comments)    REACTION: systemic malaise amigen eye drop  . Penicillins Hives  . Breo Ellipta [Fluticasone Furoate-Vilanterol] Other (See Comments)    Ran blood pressure up    Current Outpatient Prescriptions on File Prior to Visit  Medication Sig Dispense Refill  . Cholecalciferol (VITAMIN D) 2000 units CAPS Take 1 capsule by mouth daily.    . furosemide (LASIX) 40 MG tablet Take 1 tablet (40 mg total) by mouth daily. 90 tablet 1  . levothyroxine (SYNTHROID, LEVOTHROID) 88 MCG tablet Take 88 mcg by mouth daily before breakfast.    . lisinopril (PRINIVIL,ZESTRIL) 20 MG tablet Take 20 mg by mouth daily.    . Magnesium 250 MG TABS Take 1 tablet by mouth daily.    . metoprolol (LOPRESSOR) 50 MG tablet Take 50 mg by mouth daily.    . montelukast (SINGULAIR) 10 MG tablet Take 1 tablet by mouth  daily 90 tablet 2  . Multiple Vitamins-Minerals (PRESERVISION AREDS 2) CAPS Take 1 capsule by mouth daily.    . potassium chloride SA (K-DUR,KLOR-CON) 20 MEQ tablet TAKE 1 TABLET TWICE DAILY. 180 tablet 2  . XARELTO 15 MG TABS tablet Take 1 tablet by mouth  daily with supper 90 tablet 3   No current facility-administered medications on file prior to visit.

## 2015-12-21 ENCOUNTER — Encounter: Payer: Self-pay | Admitting: Adult Health

## 2015-12-21 ENCOUNTER — Ambulatory Visit (INDEPENDENT_AMBULATORY_CARE_PROVIDER_SITE_OTHER): Payer: Medicare Other | Admitting: Adult Health

## 2015-12-21 DIAGNOSIS — J441 Chronic obstructive pulmonary disease with (acute) exacerbation: Secondary | ICD-10-CM

## 2015-12-21 MED ORDER — PREDNISONE 10 MG PO TABS: ORAL_TABLET | ORAL | 0 refills | Status: DC

## 2015-12-21 MED ORDER — AZITHROMYCIN 250 MG PO TABS: ORAL_TABLET | ORAL | 0 refills | Status: AC

## 2015-12-21 NOTE — Patient Instructions (Signed)
Zpack take as directed  Mucinex DM Twice daily  As needed  Cough/congestion  Prednisone taper over next week.  Begin QVAR 2 puff Twice daily  , rinse after use.  follow up Dr. [name]  In 6-8 weeks and As needed   Please contact office for sooner follow up if symptoms do not improve or worsen or seek emergency care

## 2015-12-21 NOTE — Progress Notes (Signed)
Subjective:    Patient ID: Jody Blake, female    DOB: 12/01/1925, 82 y.o.   MRN: [id number]  HPI 82 yo female never smoker followed for AR , chronic bronchitis   12/21/2015 Acute oV  Pt presents for an acute office visit. Believes she had a reaction to BREO . [name] She had  elevated blood pressures and stopped BREO . Says she now has 1 week of cough , wheezing , congestion  . Some white to yellow mucus . No fever, orhtopnea, edema or hemoptyiss.  Appetite is okay with no n/v.d.  On ACE inhibitor.   Past Medical History:  Diagnosis Date  . Anemia   . Aortic stenosis    a. Echo 09/06/12 EF 55-60%, no WMA, G2DD, Ao valve sclerosis w/ mod stenosis, LA mildly dilated, PA pressure 40mmHg  . Asthmatic bronchitis   . Complete heart block [facility])    s/p permanent pacemaker 06/27/1999 (Battery change 06/2007 and 2016).  s/p AV nodal ablation by Dr [name] 2016.  [name] COPD (chronic obstructive pulmonary disease) (HCC)    Dr. [name]  . DDD (degenerative disc disease)   . Diastolic dysfunction    a. Echo 09/06/12 EF 55-60%, no WMA, G2DD, Ao valve sclerosis w/ mod stenosis, LA mildly dilated, PA pressure 44mmHg  . Diverticulosis   . DVT (deep vein thrombosis) in pregnancy 1954   a. LLE  . Hypertension   . Hypothyroidism    on medication  . Macular degeneration    Dr. [name]  . Osteoarthrosis, unspecified whether generalized or localized, other specified sites   . PAF (paroxysmal atrial fibrillation) (HCC)    Stopped flecainide, on amiodarone but still has bouts of A FIB (mostly in mornings).   . Pure hypercholesterolemia   . Staghorn calculus    Left   Current Outpatient Prescriptions on File Prior to Visit  Medication Sig Dispense Refill  . Cholecalciferol (VITAMIN D) 2000 units CAPS Take 1 capsule by mouth daily.    . furosemide (LASIX) 40 MG tablet Take 1 tablet (40 mg total) by mouth daily. 90 tablet 1  . levothyroxine (SYNTHROID, LEVOTHROID) 88 MCG tablet Take 88 mcg by mouth daily before  breakfast.    . lisinopril (PRINIVIL,ZESTRIL) 20 MG tablet Take 20 mg by mouth daily.    . Magnesium 250 MG TABS Take 1 tablet by mouth daily.    . metoprolol (LOPRESSOR) 50 MG tablet Take 50 mg by mouth daily.    . montelukast (SINGULAIR) 10 MG tablet Take 1 tablet by mouth  daily 90 tablet 2  . Multiple Vitamins-Minerals (PRESERVISION AREDS 2) CAPS Take 1 capsule by mouth daily.    . potassium chloride SA (K-DUR,KLOR-CON) 20 MEQ tablet TAKE 1 TABLET TWICE DAILY. 180 tablet 2  . XARELTO 15 MG TABS tablet Take 1 tablet by mouth  daily with supper 90 tablet 3   No current facility-administered medications on file prior to visit.      Review of Systems Constitutional:   No  weight loss, night sweats,  Fevers, chills,  +fatigue, or  lassitude.  HEENT:   No headaches,  Difficulty swallowing,  Tooth/dental problems, or  Sore throat,                No sneezing, itching, ear ache,  +nasal congestion, post nasal drip,   CV:  No chest pain,  Orthopnea, PND, swelling in lower extremities, anasarca, dizziness, palpitations, syncope.   GI  No heartburn, indigestion, abdominal pain, nausea, vomiting, diarrhea,  change in bowel habits, loss of appetite, bloody stools.   Resp:    Skin: no rash or lesions.  GU: no dysuria, change in color of urine, no urgency or frequency.  No flank pain, no hematuria   MS:  No joint pain or swelling.  No decreased range of motion.  No back pain.  Psych:  No change in mood or affect. No depression or anxiety.  No memory loss.         Objective:   Physical Exam Vitals:   12/21/15 1608  BP: 132/84  Pulse: 85  Temp: 97.7 F (36.5 C)  TempSrc: Oral  SpO2: 98%  Weight: 134 lb (60.8 kg)  Height: 5\' 4"  (1.626 m)   GEN: A/Ox3; pleasant , NAD, elderly and frail    HEENT:  [place]/AT,  EACs-clear, TMs-wnl, NOSE-clear, THROAT-clear, no lesions, no postnasal drip or exudate noted.   NECK:  Supple w/ fair ROM; no JVD; normal carotid impulses w/o bruits; no  thyromegaly or nodules palpated; no lymphadenopathy.    RESP  Clear  P & A; w/o, wheezes/ rales/ or rhonchi. no accessory muscle use, no dullness to percussion  CARD:  RRR, no m/r/g  , no peripheral edema, pulses intact, no cyanosis or clubbing.  GI:   Soft & nt; nml bowel sounds; no organomegaly or masses detected.   Musco: Warm bil, no deformities or joint swelling noted.   Neuro: alert, no focal deficits noted.    Skin: Warm, no lesions or rashes  Claribel Sachs NP-C  [facility]  12/21/2015        Assessment & Plan:

## 2015-12-22 ENCOUNTER — Telehealth: Payer: Self-pay | Admitting: *Deleted

## 2015-12-22 NOTE — Telephone Encounter (Signed)
Patient called and stated that she saw Pulmonologist yesterday and they want Dr. Mariea Clonts to look at another way to treat blood pressure other than Lisinopril due to cough. Please Advise.

## 2015-12-23 ENCOUNTER — Other Ambulatory Visit: Payer: Self-pay | Admitting: Internal Medicine

## 2015-12-23 MED ORDER — VALSARTAN 80 MG PO TABS: 80.0000 mg | ORAL_TABLET | Freq: Every day | ORAL | 1 refills | Status: DC

## 2015-12-23 NOTE — Telephone Encounter (Signed)
Ok, let's d/c lisinopril and begin valsartan 80mg  po daily.  I sent 30 to [facility] with 1 refill.  If she tolerates it well (without the cough), we can [facility] it [facility] as a 90 day supply.

## 2015-12-26 NOTE — Assessment & Plan Note (Signed)
Flare ? Reaction to BREO  Will change BREO to QVAR  Consider changing ACE as may make cough and wheezing worse   Plan  Patient Instructions  Zpack take as directed  Mucinex DM Twice daily  As needed  Cough/congestion  Prednisone taper over next week.  Begin QVAR 2 puff Twice daily  , rinse after use.  follow up Dr. [name]  In 6-8 weeks and As needed   Please contact office for sooner follow up if symptoms do not improve or worsen or seek emergency care

## 2015-12-27 NOTE — Telephone Encounter (Signed)
.  left message to have patient return my call.  

## 2016-01-01 NOTE — Telephone Encounter (Signed)
.  left message to have patient return my call.  

## 2016-01-11 ENCOUNTER — Other Ambulatory Visit: Payer: Self-pay

## 2016-01-11 MED ORDER — VALSARTAN 80 MG PO TABS: 80.0000 mg | ORAL_TABLET | Freq: Every day | ORAL | 1 refills | Status: DC

## 2016-01-11 NOTE — Telephone Encounter (Signed)
Spoke with patient, patient is doing ok on new B/P medication. Ok to send 90 day rx to mail order

## 2016-02-01 ENCOUNTER — Telehealth: Payer: Self-pay | Admitting: Internal Medicine

## 2016-02-01 ENCOUNTER — Encounter: Payer: Self-pay | Admitting: Internal Medicine

## 2016-02-01 ENCOUNTER — Ambulatory Visit (INDEPENDENT_AMBULATORY_CARE_PROVIDER_SITE_OTHER): Payer: Medicare Other | Admitting: Internal Medicine

## 2016-02-01 DIAGNOSIS — I482 Chronic atrial fibrillation: Secondary | ICD-10-CM | POA: Diagnosis not present

## 2016-02-01 DIAGNOSIS — I4821 Permanent atrial fibrillation: Secondary | ICD-10-CM

## 2016-02-01 DIAGNOSIS — J449 Chronic obstructive pulmonary disease, unspecified: Secondary | ICD-10-CM

## 2016-02-01 MED ORDER — FLUTICASONE-SALMETEROL 250-50 MCG/DOSE IN AEPB: INHALATION_SPRAY | RESPIRATORY_TRACT | 11 refills | Status: DC

## 2016-02-01 MED ORDER — ALBUTEROL SULFATE HFA 108 (90 BASE) MCG/ACT IN AERS: 2.0000 | INHALATION_SPRAY | Freq: Four times a day (QID) | RESPIRATORY_TRACT | 12 refills | Status: DC | PRN

## 2016-02-01 NOTE — Progress Notes (Signed)
Subjective:    Patient ID: Jody Blake, female    DOB: [birth year], 82 y.o.   MRN: [id number]  HPI F never smoker, followed for allergic rhinitis, chronic bronchitis, complicated by GERD, Hx PAfib/ pacemaker.   06/15/2015-82 year old female never smoker followed for allergic rhinitis, chronic bronchitis, complicated by GERD, Hx PAFib/ pacemaker FOLLOWS FOR: Pt states she has awful lot of phlegm-no color. Denies any wheezing, SOB, or congestion. States it feels more bronchial than anything. Cough productive on first rising in the morning. Began with a sustained winter bronchitis pattern. Dependent ankle edema progressive through the day, sleeping on 2 pillows and using Lasix. Has small left pleural effusion suggesting volume overload as of chest x-ray in January. CXR 04/20/2015 IMPRESSION: 1. Small left pleural effusion with passive atelectasis. 2. Mild enlargement of the cardiopericardial silhouette, without edema. Dual lead pacer noted. 3. Atherosclerotic aortic arch. 4. Thoracic spondylosis. Electronically Signed  By: Jody Blake M.D.  On: 04/21/2015 08:20  02/01/2016-82 year old female never smoker followed for allergic rhinitis, chronic bronchitis, complicated by GERD, history P.A fib/pacemaker, glaucoma follow up. pt has chest congestion. pt has shortness of breath. pt has run out of inhaler.  LOV-NP-12/21/2015-blamed Breo inhaler for high blood pressure. Presented with increased cough and wheeze. Treated with Z-Pak, Mucinex DM, prednisone taper. Started on Qvar to replace Breo. She thinks Qvar may have helped some before she ran out. For the past week has had increased cough and wheeze. We had changed lisinopril to valsartan and without obvious effect. 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 She does have glaucoma. Insurance wouldn't cover [name] so we tried Fire[employer]. The other covered option is Advair. CXR 06/15/2015 IMPRESSION: 1. Cardiomegaly without  pulmonary edema. 2. Small left pleural effusion, significantly improved over prior. Electronically Signed   By: Jody Blake M.D.   On: 06/15/2015 14:02   ROS-see HPI   Negative unless "+" Constitutional:    weight loss, night sweats, fevers, chills, fatigue, lassitude. HEENT:    headaches, difficulty swallowing, tooth/dental problems, sore throat,       sneezing, itching, ear ache, nasal congestion, post nasal drip, snoring CV:    chest pain, orthopnea, PND, swelling in lower extremities, anasarca,                                                     dizziness, palpitations Resp:  + shortness of breath with exertion or at rest.                productive cough,   +non-productive cough, coughing up of blood.              change in color of mucus.  +wheezing.   Skin:    rash or lesions. GI:  No-   heartburn, indigestion, abdominal pain, nausea, vomiting,  GU:  MS:   joint pain, stiffness,  Neuro-     nothing unusual Psych:  change in mood or affect.  depression or anxiety.   memory loss.  Objective:  OBJ- Physical Exam General- Alert, Oriented, Affect-appropriate, Distress- none acute Skin- rash-none, lesions- none, excoriation- none Lymphadenopathy- none Head- atraumatic            Eyes- Gross vision intact, PERRLA, conjunctivae and secretions clear            Ears- Hearing, canals-normal  Nose- Clear, no-Septal dev, mucus, polyps, erosion, perforation             Throat- Mallampati II , mucosa clear , drainage- none, tonsils- atrophic Neck- flexible , trachea midline, no stridor , thyroid nl, carotid no bruit Chest - symmetrical excursion , unlabored           Heart/CV- RRR , no murmur , no gallop  , no rub, nl s1 s2                           - JVD- none , edema- none, stasis changes- none, varices- none           Lung-  wheeze+, cough+light , dullness -none, rub- none           Chest wall-+ left pacemaker Abd-   Br/ Gen/ Rectal- Not done, not indicated Extrem-  cyanosis- none, clubbing, none, atrophy- none, strength- nl Neuro- grossly intact to observatio    Assessment & Plan:

## 2016-02-01 NOTE — Patient Instructions (Signed)
Sample Advair 250 diskus  Inhale 1 puff then rinse mouth, twice daily      Script printed  Script sent for albuterol rescue inhaler to use if needed  Flu vax  Please call as needed

## 2016-02-01 NOTE — Assessment & Plan Note (Signed)
She can sometimes tell when she is in atrial fibrillation and will lie down until heart rate slows. Does have pacemaker.

## 2016-02-01 NOTE — Assessment & Plan Note (Signed)
Likely to continue needing both LABA and ICS. Hopefully BP will be okay with Advair. She is followed for glaucoma but was on Spiriva in the past. With insurance change we can consider changing to a different comparable product if needed. If blood pressure is too difficult we can try LAMA/ ICS Plan-given available sample Advair 500 today, anticipating we would maintain her on Advair 250/50 if tolerated. Also refilling rescue inhaler.

## 2016-02-02 NOTE — Telephone Encounter (Signed)
Katie please advise on where we can schedule the pt in a late morning appt with CY in 4 months.

## 2016-02-02 NOTE — Telephone Encounter (Signed)
Pt has been scheduled to see CY 06-05-16 at 10:30am. Pt is aware of date and time. Nothing more needed at this time.

## 2016-02-06 ENCOUNTER — Telehealth: Payer: Self-pay | Admitting: Internal Medicine

## 2016-02-06 MED ORDER — PREDNISONE 10 MG PO TABS: 10.0000 mg | ORAL_TABLET | Freq: Every day | ORAL | 0 refills | Status: DC

## 2016-02-06 MED ORDER — BENZONATATE 200 MG PO CAPS: 200.0000 mg | ORAL_CAPSULE | Freq: Three times a day (TID) | ORAL | 1 refills | Status: DC | PRN

## 2016-02-06 NOTE — Telephone Encounter (Signed)
Offer prednisone 10 mg, # 7, 1 daily            Tessalon perles 200 mg, # 30, 1 every 8 hours as needed for cough

## 2016-02-06 NOTE — Telephone Encounter (Signed)
Called and spoke with pt and she stated that she is having problems again with bronchitis.  She stated that she is coughing but with white sputum. She has started back with the mucinex and using the proair as needed.  Pt is wanting to know if CY wants to send something in for her.  CY please advise. Thanks  Last ov--02/01/16 Next ov--06/05/16  Allergies  Allergen Reactions  . Brimonidine Tartrate-Timolol Other (See Comments)    REACTION: systemic malaise amigen eye drop  . Penicillins Hives  . Breo Ellipta [Fluticasone Furoate-Vilanterol] Other (See Comments)    Ran blood pressure up

## 2016-02-06 NOTE — Telephone Encounter (Signed)
Called and spoke with pt and she is aware of CY recs.  Nothing further is needed.  

## 2016-02-19 ENCOUNTER — Ambulatory Visit (INDEPENDENT_AMBULATORY_CARE_PROVIDER_SITE_OTHER): Payer: Medicare Other | Admitting: *Deleted

## 2016-02-19 DIAGNOSIS — H401131 Primary open-angle glaucoma, bilateral, mild stage: Secondary | ICD-10-CM | POA: Diagnosis not present

## 2016-02-19 DIAGNOSIS — I495 Sick sinus syndrome: Secondary | ICD-10-CM

## 2016-02-20 ENCOUNTER — Ambulatory Visit (INDEPENDENT_AMBULATORY_CARE_PROVIDER_SITE_OTHER): Payer: Medicare Other | Admitting: Cardiology

## 2016-02-20 ENCOUNTER — Encounter: Payer: Self-pay | Admitting: Cardiology

## 2016-02-20 VITALS — BP 126/80 | HR 84 | Ht 63.0 in | Wt 134.2 lb

## 2016-02-20 DIAGNOSIS — Z950 Presence of cardiac pacemaker: Secondary | ICD-10-CM

## 2016-02-20 DIAGNOSIS — I482 Chronic atrial fibrillation: Secondary | ICD-10-CM | POA: Diagnosis not present

## 2016-02-20 DIAGNOSIS — I442 Atrioventricular block, complete: Secondary | ICD-10-CM

## 2016-02-20 DIAGNOSIS — I4821 Permanent atrial fibrillation: Secondary | ICD-10-CM

## 2016-02-20 NOTE — Progress Notes (Signed)
[place]. [address], [address] [place], [place]  [address] Phone: [phone] Fax:  [phone]  Date:  02/20/2016   ID:  Jody Blake, DOB 1925/08/03, MRN [id number]  PCP:  Hollace Kinnier, DO   History of Present Illness: Jody Blake is a 82 y.o. female with permanent pacemaker placement secondary to AV nodal ablation because of difficult to control atrial fibrillation with rapid ventricular response despite heavy doses of antiarrhythmic therapy.  Has been living at [facility]. Doing much better since ablative therapy. No chest pain, no shortness of breath. No bleeding  Her husband, Jody Blake, has been quite feeble recently. Dementia, does not talk.   Wt Readings from Last 3 Encounters:  02/20/16 134 lb 3.2 oz (60.9 kg)  02/01/16 138 lb 9.6 oz (62.9 kg)  12/21/15 134 lb (60.8 kg)     Past Medical History:  Diagnosis Date  . Anemia   . Aortic stenosis    a. Echo 09/06/12 EF 55-60%, no WMA, G2DD, Ao valve sclerosis w/ mod stenosis, LA mildly dilated, PA pressure 61mmHg  . Asthmatic bronchitis   . Complete heart block [facility])    s/p permanent pacemaker 06/27/1999 (Battery change 06/2007 and 2016).  s/p AV nodal ablation by Dr [name] 2016.  [name] COPD (chronic obstructive pulmonary disease) (HCC)    Dr. [name]  . DDD (degenerative disc disease)   . Diastolic dysfunction    a. Echo 09/06/12 EF 55-60%, no WMA, G2DD, Ao valve sclerosis w/ mod stenosis, LA mildly dilated, PA pressure 53mmHg  . Diverticulosis   . DVT (deep vein thrombosis) in pregnancy [facility]) 1954   a. LLE  . Hypertension   . Hypothyroidism    on medication  . Macular degeneration    Dr. [name]  . Osteoarthrosis, unspecified whether generalized or localized, other specified sites   . PAF (paroxysmal atrial fibrillation) (HCC)    Stopped flecainide, on amiodarone but still has bouts of A FIB (mostly in mornings).   . Pure hypercholesterolemia   . Staghorn calculus    Left    Past Surgical History:  Procedure  Laterality Date  . APPENDECTOMY  ~ 1941  . AV NODE ABLATION  05/10/2014  . AV NODE ABLATION N/A 05/10/2014   Procedure: AV NODE ABLATION;  Surgeon: Thompson Grayer, MD;  Location: [facility] CATH LAB;  Service: Cardiovascular;  Laterality: N/A;  . BUNIONECTOMY WITH HAMMERTOE RECONSTRUCTION Bilateral ~ 1990  . CARDIAC PACEMAKER PLACEMENT  06/27/99   Medtronic PM implanted by Dr [name]  . CARDIOVERSION N/A 12/02/2013   Procedure: CARDIOVERSION;  Surgeon: [name], MD;  Location: [facility] ENDOSCOPY;  Service: Cardiovascular;  Laterality: N/A;  . CATARACT EXTRACTION W/ INTRAOCULAR LENS  IMPLANT, BILATERAL Bilateral   . COLONOSCOPY WITH PROPOFOL N/A 04/22/2013   Procedure: COLONOSCOPY WITH PROPOFOL;  Surgeon: [name], MD;  Location: [facility] ENDOSCOPY;  Service: Endoscopy;  Laterality: N/A;  . DILATION AND CURETTAGE OF UTERUS  X 2   "when I was going thru menopause"  . ESOPHAGOGASTRODUODENOSCOPY (EGD) WITH PROPOFOL N/A 04/22/2013   Procedure: ESOPHAGOGASTRODUODENOSCOPY (EGD) WITH PROPOFOL;  Surgeon: [name], MD;  Location: [facility] ENDOSCOPY;  Service: Endoscopy;  Laterality: N/A;  . HERNIA REPAIR    . INCISIONAL HERNIA REPAIR    . INSERT / REPLACE / REMOVE PACEMAKER  06/2007   "took out the old; put in new"  . INSERT / REPLACE / REMOVE PACEMAKER  05/10/2014   MDT PPM generator change by Dr [name]  .  JOINT REPLACEMENT    . PARTIAL NEPHRECTOMY Left 05/1974   stone disease  . PERMANENT PACEMAKER GENERATOR CHANGE N/A 05/10/2014   Procedure: PERMANENT PACEMAKER GENERATOR CHANGE;  Surgeon: Thompson Grayer, MD;  Location[facility] CATH LAB;  Service: Cardiovascular;  Laterality: N/A;  . TONSILLECTOMY AND ADENOIDECTOMY  1930's  . TOTAL KNEE ARTHROPLASTY Right 2001    Current Outpatient Prescriptions  Medication Sig Dispense Refill  . albuterol (PROVENTIL HFA;VENTOLIN HFA) 108 (90 Base) MCG/ACT inhaler Inhale 2 puffs into the lungs every 6 (six) hours as needed for wheezing or shortness of breath. 1 Inhaler 12  .  benzonatate (TESSALON) 200 MG capsule Take 1 capsule (200 mg total) by mouth 3 (three) times daily as needed for cough. 30 capsule 1  . Cholecalciferol (VITAMIN D) 2000 units CAPS Take 1 capsule by mouth daily.    . furosemide (LASIX) 40 MG tablet Take 1 tablet (40 mg total) by mouth daily. 90 tablet 1  . levothyroxine (SYNTHROID, LEVOTHROID) 88 MCG tablet Take 88 mcg by mouth daily before breakfast.    . Magnesium 250 MG TABS Take 1 tablet by mouth daily.    . metoprolol (LOPRESSOR) 50 MG tablet Take 50 mg by mouth daily.    . montelukast (SINGULAIR) 10 MG tablet Take 1 tablet by mouth  daily 90 tablet 2  . Multiple Vitamins-Minerals (PRESERVISION AREDS 2) CAPS Take 1 capsule by mouth daily.    . potassium chloride SA (K-DUR,KLOR-CON) 20 MEQ tablet TAKE 1 TABLET TWICE DAILY. 180 tablet 2  . valsartan (DIOVAN) 80 MG tablet Take 1 tablet (80 mg total) by mouth daily. 90 tablet 1  . XARELTO 15 MG TABS tablet Take 1 tablet by mouth  daily with supper 90 tablet 3   No current facility-administered medications for this visit.     Allergies:    Allergies  Allergen Reactions  . Brimonidine Tartrate-Timolol Other (See Comments)    REACTION: systemic malaise amigen eye drop  . Penicillins Hives  . Breo Ellipta [Fluticasone Furoate-Vilanterol] Other (See Comments)    Ran blood pressure up    Social History:  The patient  reports that she has never smoked. She has never used smokeless tobacco. She reports that she drinks about 4.2 oz of alcohol per week . She reports that she does not use drugs.   Family History  Problem Relation Age of Onset  . Malignant hypertension Father   . Hypertension Father   . Renal Disease Father   . Breast cancer Mother   . Heart attack Brother   . Stroke Brother     ROS:  Please see the history of present illness.   No melena, no bleeding. Fatigue has improved dramatically. No shortness of breath, no chest pain.   All other systems reviewed and negative.    PHYSICAL EXAM: VS:  BP 126/80   Pulse 84   Ht 5\' 3"  (1.6 m)   Wt 134 lb 3.2 oz (60.9 kg)   LMP 04/08/1974   BMI 23.77 kg/m  Well nourished, well developed, in no acute distress HEENT: normal, /AT, EOMI Neck: no JVD, normal carotid upstroke, no bruit Cardiac:  normal S1, S2;  1/6 S murmur Lungs:  clear to auscultation bilaterally, no wheezing, rhonchi or rales Abd: soft, nontender, no hepatomegaly, no bruits Ext: mild pedal edema, 2+ distal pulses Skin: warm and dry GU: deferred Neuro: no focal abnormalities noted, AAO x 3  EKG:  None today    Labs: 4/15-hemoglobin 12, back up to  normal   ASSESSMENT AND PLAN:  1. Complete heart block-difficult to control atrial fibrillation heart rates, AV nodal ablation with permanent pacemaker. She is feeling so much better post ablation. 2. Paroxysmal atrial fibrillation-post AV nodal ablation because of difficult to control heart rate.. 3. Chronic anticoagulation-low-dose Xarelto given age. No bleeding currently. Dr. Hollace Kinnier at [facility] monitoring blood work closely. 4. Hypothyroidism-per Dr. Mariea Clonts. 5. Pacemaker-functioning well. 12 months f/u.  Signed, Candee Furbish, MD [facility]  02/20/2016 3:10 PM

## 2016-02-20 NOTE — Progress Notes (Signed)
Remote pacemaker transmission.   

## 2016-02-20 NOTE — Patient Instructions (Signed)

## 2016-03-01 LAB — CUP PACEART REMOTE DEVICE CHECK
Battery Impedance: 111 Ohm
Battery Remaining Longevity: 148 mo
Brady Statistic RV Percent Paced: 99 %
Implantable Lead Implant Date: 20090318
Implantable Lead Location: 753859
Implantable Lead Model: 5076
Implantable Lead Model: 5092
Lead Channel Impedance Value: 668 Ohm
Lead Channel Impedance Value: 67 Ohm
Lead Channel Pacing Threshold Amplitude: 1.125 V
Lead Channel Setting Pacing Amplitude: 2.5 V
Lead Channel Setting Pacing Pulse Width: 0.4 ms
Lead Channel Setting Sensing Sensitivity: 4 mV
MDC IDC LEAD IMPLANT DT: 20090318
MDC IDC LEAD LOCATION: 753860
MDC IDC MSMT BATTERY VOLTAGE: 2.8 V
MDC IDC MSMT LEADCHNL RV PACING THRESHOLD PULSEWIDTH: 0.4 ms
MDC IDC PG IMPLANT DT: 20160202
MDC IDC SESS DTM: 20171113125324

## 2016-03-20 ENCOUNTER — Ambulatory Visit (INDEPENDENT_AMBULATORY_CARE_PROVIDER_SITE_OTHER): Payer: Medicare Other | Admitting: Internal Medicine

## 2016-03-20 ENCOUNTER — Encounter: Payer: Self-pay | Admitting: Internal Medicine

## 2016-03-20 VITALS — BP 168/100 | HR 87 | Temp 97.9°F | Wt 135.0 lb

## 2016-03-20 DIAGNOSIS — J449 Chronic obstructive pulmonary disease, unspecified: Secondary | ICD-10-CM | POA: Diagnosis not present

## 2016-03-20 DIAGNOSIS — I482 Chronic atrial fibrillation: Secondary | ICD-10-CM | POA: Diagnosis not present

## 2016-03-20 DIAGNOSIS — I10 Essential (primary) hypertension: Secondary | ICD-10-CM

## 2016-03-20 DIAGNOSIS — J441 Chronic obstructive pulmonary disease with (acute) exacerbation: Secondary | ICD-10-CM

## 2016-03-20 DIAGNOSIS — I4821 Permanent atrial fibrillation: Secondary | ICD-10-CM

## 2016-03-20 MED ORDER — LEVOFLOXACIN 500 MG PO TABS: 500.0000 mg | ORAL_TABLET | Freq: Every day | ORAL | 0 refills | Status: DC

## 2016-03-20 MED ORDER — IPRATROPIUM-ALBUTEROL 0.5-2.5 (3) MG/3ML IN SOLN
3.0000 mL | Freq: Once | RESPIRATORY_TRACT | Status: AC
Start: 2016-03-20 — End: 2016-03-20
  Administered 2016-03-20: 3 mL via RESPIRATORY_TRACT

## 2016-03-20 MED ORDER — PREDNISONE 10 MG PO TABS: ORAL_TABLET | ORAL | 0 refills | Status: DC

## 2016-03-20 MED ORDER — METHYLPREDNISOLONE ACETATE 40 MG/ML IJ SUSP
40.0000 mg | Freq: Once | INTRAMUSCULAR | Status: AC
  Administered 2016-03-20: 40 mg via INTRAMUSCULAR

## 2016-03-20 MED ORDER — IPRATROPIUM-ALBUTEROL 0.5-2.5 (3) MG/3ML IN SOLN: 3.0000 mL | Freq: Once | RESPIRATORY_TRACT | Status: DC

## 2016-03-20 NOTE — Progress Notes (Addendum)
Patient ID: Jody Blake, female   DOB: [birth year], 82 y.o.   MRN: [id number]    Location:  [facility] Place of Service: OFFICE  Chief Complaint  Patient presents with  . Acute Visit    cough worse past couple of weeks, Has Tessalon not using, can't tell it helps.  . Hypertension    186/113 this morning    HPI:  82 yo female seen today for COPD sx's. She is followed by pulmonary Dr [name]. Her Memory Dance was changed to Advair in Oct 2017 due to c/a elevated BPs. She noticed advair has caused elevated BPs and she stopped using it yesterday due to BP 180/107. She reports fatigue, HA, neck pain this AM. She has a cough, clear sputum pdtn, not relieved with Tessalon Perles. She also had CP this AM that resolved on its own. she also reports palpitations. No dizziness, nausea, f/c, change in appetite, sleep disturbance, sinus pressure, sore throat. She has rhinorrhea and SOB with exertion.  Hx afib - rate controlled on lopressor.she takes xeralto for anticoagulation. Followed by cardio. she has had an ablation and now has pacemaker  COPD/chronic bronchitis/seasonal allergy - followed by pulmonary. Takes singulair and prn proair HFA. she stopped advair due to elevated BPs. She had a similar rxn to [facility]  HTN - stable on lopressor, diovan and lasix.    Past Medical History:  Diagnosis Date  . Anemia   . Aortic stenosis    a. Echo 09/06/12 EF 55-60%, no WMA, G2DD, Ao valve sclerosis w/ mod stenosis, LA mildly dilated, PA pressure 63mmHg  . Asthmatic bronchitis   . Complete heart block [facility])    s/p permanent pacemaker 06/27/1999 (Battery change 06/2007 and 2016).  s/p AV nodal ablation by Dr [name] 2016.  [name] COPD (chronic obstructive pulmonary disease) (HCC)    Dr. [name]  . DDD (degenerative disc disease)   . Diastolic dysfunction    a. Echo 09/06/12 EF 55-60%, no WMA, G2DD, Ao valve sclerosis w/ mod stenosis, LA mildly dilated, PA pressure 66mmHg  . Diverticulosis   . DVT (deep vein thrombosis) in pregnancy  [facility]) 1954   a. LLE  . Hypertension   . Hypothyroidism    on medication  . Macular degeneration    Dr. [name]  . Osteoarthrosis, unspecified whether generalized or localized, other specified sites   . PAF (paroxysmal atrial fibrillation) (HCC)    Stopped flecainide, on amiodarone but still has bouts of A FIB (mostly in mornings).   . Pure hypercholesterolemia   . Staghorn calculus    Left    Past Surgical History:  Procedure Laterality Date  . APPENDECTOMY  ~ 1941  . AV NODE ABLATION  05/10/2014  . AV NODE ABLATION N/A 05/10/2014   Procedure: AV NODE ABLATION;  Surgeon: Thompson Grayer, MD;  Location: [facility] CATH LAB;  Service: Cardiovascular;  Laterality: N/A;  . BUNIONECTOMY WITH HAMMERTOE RECONSTRUCTION Bilateral ~ 1990  . CARDIAC PACEMAKER PLACEMENT  06/27/99   Medtronic PM implanted by Dr [name]  . CARDIOVERSION N/A 12/02/2013   Procedure: CARDIOVERSION;  Surgeon: [name], MD;  Location: [facility] ENDOSCOPY;  Service: Cardiovascular;  Laterality: N/A;  . CATARACT EXTRACTION W/ INTRAOCULAR LENS  IMPLANT, BILATERAL Bilateral   . COLONOSCOPY WITH PROPOFOL N/A 04/22/2013   Procedure: COLONOSCOPY WITH PROPOFOL;  Surgeon: [name], MD;  Location: [facility] ENDOSCOPY;  Service: Endoscopy;  Laterality: N/A;  . DILATION AND CURETTAGE OF UTERUS  X 2   "when I was going thru menopause"  .  ESOPHAGOGASTRODUODENOSCOPY (EGD) WITH PROPOFOL N/A 04/22/2013   Procedure: ESOPHAGOGASTRODUODENOSCOPY (EGD) WITH PROPOFOL;  Surgeon: [name], MD;  Location: [facility] ENDOSCOPY;  Service: Endoscopy;  Laterality: N/A;  . HERNIA REPAIR    . INCISIONAL HERNIA REPAIR    . INSERT / REPLACE / REMOVE PACEMAKER  06/2007   "took out the old; put in new"  . INSERT / REPLACE / REMOVE PACEMAKER  05/10/2014   MDT PPM generator change by Dr [name]  . JOINT REPLACEMENT    . PARTIAL NEPHRECTOMY Left 05/1974   stone disease  . PERMANENT PACEMAKER GENERATOR CHANGE N/A 05/10/2014   Procedure: PERMANENT PACEMAKER GENERATOR CHANGE;   Surgeon: Thompson Grayer, MD;  Location: [facility] CATH LAB;  Service: Cardiovascular;  Laterality: N/A;  . TONSILLECTOMY AND ADENOIDECTOMY  1930's  . TOTAL KNEE ARTHROPLASTY Right 2001    Patient Care Team: Gayland Curry, DO as PCP - General (Geriatric Medicine) Thompson Grayer, MD as Consulting Physician (Cardiology) Jerline Pain, MD as Consulting Physician (Cardiology) Deneise Lever, MD as Consulting Physician (Pulmonary Disease) Rutherford Guys, MD as Consulting Physician (Ophthalmology) [name], MD as Consulting Physician (Gastroenterology) Gaynelle Arabian, MD as Consulting Physician (Orthopedic Surgery)  Social History   Social History  . Marital status: Married    Spouse name: N/A  . Number of children: 2  . Years of education: N/A   Occupational History  . RETIRED Retired    Statistician business   Social History Main Topics  . Smoking status: Never Smoker  . Smokeless tobacco: Never Used  . Alcohol use 4.2 oz/week    7 Glasses of wine per week  . Drug use: No  . Sexual activity: Not Currently    Partners: Male   Other Topics Concern  . Not on file   Social History Narrative   Diet? Low salt, low fat      Do you drink/eat things with caffeine? yes      Marital status?                 married                   What year were you married? 1949      Do you live in a house, apartment, assisted living, condo, trailer, etc.? apartment      Is it one or more stories? 3 stories      How many persons live in your home? Just me      Do you have any pets in your home? (please list) no      Current or past profession: accounting      Do you exercise?          yes                            Type & how often? Walk, class, prescribed daily      Do you have a living will? yes      Do you have a DNR form?     yes                             If not, do you want to discuss one?      Do you have signed POA/HPOA for forms? no              reports that she has never  smoked. She has never used smokeless tobacco. She reports that she drinks about 4.2 oz of alcohol per week . She reports that she does not use drugs.  Family History  Problem Relation Age of Onset  . Malignant hypertension Father   . Hypertension Father   . Renal Disease Father   . Breast cancer Mother   . Heart attack Brother   . Stroke Brother    Family Status  Relation Status  . Father Deceased at age 50  . Mother Deceased at age 51   breast cancer  . Brother Deceased at age 38  . Child Alive   In good health  . Child Alive   In good health     Allergies  Allergen Reactions  . Brimonidine Tartrate-Timolol Other (See Comments)    REACTION: systemic malaise amigen eye drop  . Penicillins Hives  . Breo Ellipta [Fluticasone Furoate-Vilanterol] Other (See Comments)    Ran blood pressure up    Medications: Patient's Medications  New Prescriptions   No medications on file  Previous Medications   ALBUTEROL (PROVENTIL HFA;VENTOLIN HFA) 108 (90 BASE) MCG/ACT INHALER    Inhale 2 puffs into the lungs every 6 (six) hours as needed for wheezing or shortness of breath.   BENZONATATE (TESSALON) 200 MG CAPSULE    Take 1 capsule (200 mg total) by mouth 3 (three) times daily as needed for cough.   CHOLECALCIFEROL (VITAMIN D) 2000 UNITS CAPS    Take 1 capsule by mouth daily.   FLUTICASONE-SALMETEROL (ADVAIR) 250-50 MCG/DOSE AEPB    Inhale 1 puff into the lungs 2 (two) times daily.   FUROSEMIDE (LASIX) 40 MG TABLET    Take 1 tablet (40 mg total) by mouth daily.   LEVOTHYROXINE (SYNTHROID, LEVOTHROID) 88 MCG TABLET    Take 88 mcg by mouth daily before breakfast.   LUTEIN PO    Take by mouth. 25mg  one daily   MAGNESIUM 250 MG TABS    Take 1 tablet by mouth daily.   METOPROLOL (LOPRESSOR) 50 MG TABLET    Take 50 mg by mouth daily.   MONTELUKAST (SINGULAIR) 10 MG TABLET    Take 1 tablet by mouth  daily   MULTIPLE VITAMINS-MINERALS (PRESERVISION AREDS 2) CAPS    Take 1 capsule by mouth  daily.   POTASSIUM CHLORIDE SA (K-DUR,KLOR-CON) 20 MEQ TABLET    TAKE 1 TABLET TWICE DAILY.   VALSARTAN (DIOVAN) 80 MG TABLET    Take 1 tablet (80 mg total) by mouth daily.   XARELTO 15 MG TABS TABLET    Take 1 tablet by mouth  daily with supper  Modified Medications   No medications on file  Discontinued Medications   No medications on file    Review of Systems  Constitutional: Negative for appetite change.  HENT: Positive for rhinorrhea.   Respiratory: Positive for cough, chest tightness and shortness of breath.   Cardiovascular: Positive for chest pain.  All other systems reviewed and are negative.   Vitals:   03/20/16 1307  BP: (!) 168/100  Pulse: 87  Temp: 97.9 F (36.6 C)  TempSrc: Oral  SpO2: 98%  Weight: 135 lb (61.2 kg)   Body mass index is 23.91 kg/m.  Physical Exam  Constitutional: She is oriented to person, place, and time.  HENT:  Mouth/Throat: No oropharyngeal exudate.  Oropharynx cobblestoning and red but no exudate  Eyes: Pupils are equal, round, and reactive to light. Right eye exhibits no discharge. Left eye exhibits no discharge.  No scleral icterus.  Neck: Neck supple.  Cardiovascular: Normal rate, regular rhythm and intact distal pulses.  Exam reveals no gallop and no friction rub.   Murmur (1/6 SEM) heard. Pulmonary/Chest: Effort normal. No respiratory distress. She has wheezes (b/l end expiratorywith prolonged expiratory phase). She has no rhonchi. She has no rales. She exhibits no tenderness.  Musculoskeletal: She exhibits edema.  Lymphadenopathy:    She has no cervical adenopathy.  Neurological: She is alert and oriented to person, place, and time.  Skin: Skin is warm and dry. No rash noted. No erythema. No pallor.  Psychiatric: She has a normal mood and affect. Her behavior is normal. Judgment and thought content normal.     Labs reviewed: Clinical Support on 02/19/2016  Component Date Value Ref Range Status  . Date Time Interrogation  Session 03/01/2016 [phone]   Final  . Pulse Generator Manufacturer 03/01/2016 MERM   Final  . Pulse Gen Model 03/01/2016 ADDRL1 Adapta   Final  . Pulse Gen Serial Number 03/01/2016 [id number]   Final  . Clinic Name 03/01/2016 [facility]   Final  . Implantable Pulse Generator Type 03/01/2016 Implantable Pulse Generator   Final  . Implantable Pulse Generator Implan* 03/01/2016 [id number]   Final  . Implantable Lead Manufacturer 03/01/2016 MERM   Final  . Implantable Lead Model 03/01/2016 5076 CapSureFix Novus   Final  . Implantable Lead Serial Number 03/01/2016 [id number]   Final  . Implantable Lead Implant Date 03/01/2016 [id number]   Final  . Implantable Lead Location Detail 1 03/01/2016 APPENDAGE   Final  . Implantable Lead Location 03/01/2016 [id number]   Final  . Implantable Lead Manufacturer 03/01/2016 MERM   Final  . Implantable Lead Model 03/01/2016 5092 CapSure SP Novus   Final  . Implantable Lead Serial Number 03/01/2016 [id number]   Final  . Implantable Lead Implant Date 03/01/2016 [id number]   Final  . Implantable Lead Location Detail 1 03/01/2016 APEX   Final  . Implantable Lead Location 03/01/2016 [id number]   Final  . Lead Channel Setting Sensing Sensi* 03/01/2016 4.00  mV Final  . Lead Channel Setting Pacing Pulse * 03/01/2016 0.40  ms Final  . Lead Channel Setting Pacing Amplit* 03/01/2016 2.500  V Final  . Lead Channel Impedance Value 03/01/2016 67  ohm Final  . Lead Channel Impedance Value 03/01/2016 668  ohm Final  . Lead Channel Pacing Threshold Ampl* 03/01/2016 1.125  V Final  . Lead Channel Pacing Threshold Puls* 03/01/2016 0.40  ms Final  . Battery Status 03/01/2016 OK   Final  . Battery Remaining Longevity 03/01/2016 148  mo Final  . Battery Voltage 03/01/2016 2.80  V Final  . Battery Impedance 03/01/2016 111  ohm Final  . Brady Statistic RV Percent Paced 03/01/2016 99  % Final  . Eval Rhythm 03/01/2016 Vp   Final    No results found.   Assessment/Plan    ICD-9-CM ICD-10-CM   1. COPD exacerbation (HCC) 491.21 J44.1 methylPREDNISolone acetate (DEPO-MEDROL) injection 40 mg     predniSONE (DELTASONE) 10 MG tablet     levofloxacin (LEVAQUIN) 500 MG tablet     ipratropium-albuterol (DUONEB) 0.5-2.5 (3) MG/3ML nebulizer solution 3 mL     DISCONTINUED: ipratropium-albuterol (DUONEB) 0.5-2.5 (3) MG/3ML nebulizer solution 3 mL  2. COPD with chronic bronchitis (HCC) 491.20 J44.9   3. Essential hypertension 401.9 I10   4. Permanent atrial fibrillation (HCC) 427.31 I48.2    Take levaquin daily x 7 days  Take prednisone as ordered. DO  NOT START PREDNISONE UNTIL 03/21/16  Use Proair HFA as needed for wheezing, cough or difficulty breathing  HOLD ADVAIR DUE TO ELEVATED BP  Follow up with Dr [name] as scheduled  Depo-medrol 40mg  injection given today  Duoneb treatment given today  Recommend you take probiotic (activia/culturelle/florastor) daily while on antibiotic to keep colon healthy  Push fluids and rest  Follow up with Dr Mariea Clonts as scheduled or sooner if not feeling better  [facility] S. Perlie Gold  [facility] [address] [place], [place] [address] [phone] Cell (Monday-Friday 8 AM - 5 PM) [phone] After 5 PM and follow prompts

## 2016-03-20 NOTE — Patient Instructions (Signed)
Take levaquin daily x 7 days  Take prednisone as ordered. DO NOT START PREDNISONE UNTIL 03/21/16  Use Proair HFA as needed for wheezing, cough or difficulty breathing  Follow up with Dr [name] as scheduled  Depo-medrol 40mg  injection given today  Duoneb treatment given today  Recommend you take probiotic (activia/culturelle/florastor) daily while on antibiotic to keep colon healthy  Push fluids and rest  Follow up with Dr Mariea Clonts as scheduled or sooner if not feeling better

## 2016-03-26 ENCOUNTER — Non-Acute Institutional Stay: Payer: Medicare Other | Admitting: Internal Medicine

## 2016-03-26 ENCOUNTER — Encounter: Payer: Medicare Other | Admitting: Internal Medicine

## 2016-03-26 ENCOUNTER — Encounter: Payer: Self-pay | Admitting: Internal Medicine

## 2016-03-26 VITALS — BP 120/70 | HR 79 | Temp 97.7°F | Ht 63.0 in | Wt 136.0 lb

## 2016-03-26 DIAGNOSIS — I4821 Permanent atrial fibrillation: Secondary | ICD-10-CM

## 2016-03-26 DIAGNOSIS — H40053 Ocular hypertension, bilateral: Secondary | ICD-10-CM

## 2016-03-26 DIAGNOSIS — I482 Chronic atrial fibrillation: Secondary | ICD-10-CM

## 2016-03-26 DIAGNOSIS — H353130 Nonexudative age-related macular degeneration, bilateral, stage unspecified: Secondary | ICD-10-CM

## 2016-03-26 DIAGNOSIS — I442 Atrioventricular block, complete: Secondary | ICD-10-CM

## 2016-03-26 DIAGNOSIS — J441 Chronic obstructive pulmonary disease with (acute) exacerbation: Secondary | ICD-10-CM | POA: Diagnosis not present

## 2016-03-26 DIAGNOSIS — Z0000 Encounter for general adult medical examination without abnormal findings: Secondary | ICD-10-CM | POA: Diagnosis not present

## 2016-03-26 DIAGNOSIS — E039 Hypothyroidism, unspecified: Secondary | ICD-10-CM | POA: Diagnosis not present

## 2016-03-26 DIAGNOSIS — J449 Chronic obstructive pulmonary disease, unspecified: Secondary | ICD-10-CM | POA: Diagnosis not present

## 2016-03-26 DIAGNOSIS — I10 Essential (primary) hypertension: Secondary | ICD-10-CM | POA: Diagnosis not present

## 2016-03-26 MED ORDER — METOPROLOL TARTRATE 50 MG PO TABS: 75.0000 mg | ORAL_TABLET | Freq: Every evening | ORAL | 3 refills | Status: DC

## 2016-03-26 NOTE — Progress Notes (Signed)
Location:  Occupational psychologist of Service:  Clinic (12) Provider: Capucine Tryon L. Mariea Clonts, D.O., C.M.D.  Patient Care Team: Gayland Curry, DO as PCP - General (Geriatric Medicine) Thompson Grayer, MD as Consulting Physician (Cardiology) Jerline Pain, MD as Consulting Physician (Cardiology) Deneise Lever, MD as Consulting Physician (Pulmonary Disease) Rutherford Guys, MD as Consulting Physician (Ophthalmology) [name], MD as Consulting Physician (Gastroenterology) Gaynelle Arabian, MD as Consulting Physician (Orthopedic Surgery)  Extended Emergency Contact Information Primary Emergency Contact: [name] of [place] Phone: [phone] Mobile Phone: [phone] Relation: Son Secondary Emergency Contact: Hunt,Cheryl  [place] Mobile Phone: [phone] Relation: Daughter  Code Status: DNR Goals of Care: Advanced Directive information Advanced Directives 03/26/2016  Does Patient Have a Medical Advance Directive? Yes  Type of Paramedic of [place];Out of facility DNR (pink MOST or yellow form)  Does patient want to make changes to medical advance directive? -  Copy of [place] in Chart? Yes  Would patient like information on creating a medical advance directive? -  Pre-existing out of facility DNR order (yellow form or pink MOST form) Yellow form placed in chart (order not valid for inpatient use)   Chief Complaint  Patient presents with  . Annual Exam    wellness exam  . MMSE    30/30 passed clock    HPI: Patient is a 82 y.o. female seen in today for an annual wellness exam and f/u after COPD exacerbation.  She reports feeling much better than she did over the weekend, but very weak and fatigued today.  Her bp is lower than usual.    BP had been very high Saturday and she felt terrible.  She'd felt great right after the shot of prednisone.  180/115 then.  She called the  nurse here and it was monitored Sat.  She took an extra furosemide Sat night.  BP was a little better in am at 165.  Normally comes down to 135/90.  Says then her right posterior leg started hurting.  It, too, has gotten better.  It's a little sore.  Yesterday (monday), she was pretty alright.  Now this am BP is low.    Breathing improved.  No coughing this am.    Last inhaler was advair.  BP started trending up and she stopped it.  She then had her exacerbation.  She has also taken dulera, spiriva and breo before with similar outcome with the breo.  Does not see Dr.Young again until February.    Back doing overall better except first thing in the morning.  Sees a Curator.  She is using her drops for her glaucoma.  She got an alternate drop due to insurance changing coverage.  Depression screen [facility] 2/9 03/26/2016 11/08/2015  Decreased Interest 0 0  Down, Depressed, Hopeless 0 0  PHQ - 2 Score 0 0    Fall Risk  03/26/2016 11/08/2015  Falls in the past year? No No   MMSE - Mini Mental State Exam 03/26/2016  Orientation to time 5  Orientation to Place 5  Registration 3  Attention/ Calculation 5  Recall 3  Language- name 2 objects 2  Language- repeat 1  Language- follow 3 step command 3  Language- read & follow direction 1  Write a sentence 1  Copy design 1  Total score 30  passed clock  Health Maintenance  Topic Date Due  . TETANUS/TDAP  02/15/1947  . ZOSTAVAX  02/15/1988  . DEXA SCAN  02/14/1993  . PNA vac Low Risk Adult (2 of 2 - PCV13) 11/24/2014  . INFLUENZA VACCINE  Completed    Functional Status Survey: Is the patient deaf or have difficulty hearing?: Yes Does the patient have difficulty seeing, even when wearing glasses/contacts?: Yes (glaucoma and macular degeneration--[place], [place]) Does the patient have difficulty concentrating, remembering, or making decisions?: No Does the patient have difficulty walking or climbing stairs?: No Does the patient have  difficulty dressing or bathing?: No Does the patient have difficulty doing errands alone such as visiting a doctor's office or shopping?: No Current Exercise Habits: Structured exercise class, Type of exercise: strength training/weights;stretching;walking, Time (Minutes): 45, Frequency (Times/Week): 7, Weekly Exercise (Minutes/Week): 315, Intensity: Moderate Exercise limited by: respiratory conditions(s) Diet? regular Vision Screening Comments: Dr. Gershon Crane 02/2016 Hearing:  Very HOH even with hearing aides Dentition: no problems Pain:  See assessment (back)  Past Medical History:  Diagnosis Date  . Anemia   . Aortic stenosis    a. Echo 09/06/12 EF 55-60%, no WMA, G2DD, Ao valve sclerosis w/ mod stenosis, LA mildly dilated, PA pressure 37mmHg  . Asthmatic bronchitis   . Complete heart block [facility])    s/p permanent pacemaker 06/27/1999 (Battery change 06/2007 and 2016).  s/p AV nodal ablation by Dr [name] 2016.  [name] COPD (chronic obstructive pulmonary disease) (HCC)    Dr. [name]  . DDD (degenerative disc disease)   . Diastolic dysfunction    a. Echo 09/06/12 EF 55-60%, no WMA, G2DD, Ao valve sclerosis w/ mod stenosis, LA mildly dilated, PA pressure 64mmHg  . Diverticulosis   . DVT (deep vein thrombosis) in pregnancy [facility]) 1954   a. LLE  . Hypertension   . Hypothyroidism    on medication  . Macular degeneration    Dr. [name]  . Osteoarthrosis, unspecified whether generalized or localized, other specified sites   . PAF (paroxysmal atrial fibrillation) (HCC)    Stopped flecainide, on amiodarone but still has bouts of A FIB (mostly in mornings).   . Pure hypercholesterolemia   . Staghorn calculus    Left    Past Surgical History:  Procedure Laterality Date  . APPENDECTOMY  ~ 1941  . AV NODE ABLATION  05/10/2014  . AV NODE ABLATION N/A 05/10/2014   Procedure: AV NODE ABLATION;  Surgeon: Thompson Grayer, MD;  Location: [facility] CATH LAB;  Service: Cardiovascular;  Laterality: N/A;  . BUNIONECTOMY WITH  HAMMERTOE RECONSTRUCTION Bilateral ~ 1990  . CARDIAC PACEMAKER PLACEMENT  06/27/99   Medtronic PM implanted by Dr [name]  . CARDIOVERSION N/A 12/02/2013   Procedure: CARDIOVERSION;  Surgeon: [name], MD;  Location: [facility] ENDOSCOPY;  Service: Cardiovascular;  Laterality: N/A;  . CATARACT EXTRACTION W/ INTRAOCULAR LENS  IMPLANT, BILATERAL Bilateral   . COLONOSCOPY WITH PROPOFOL N/A 04/22/2013   Procedure: COLONOSCOPY WITH PROPOFOL;  Surgeon: [name], MD;  Location: [facility] ENDOSCOPY;  Service: Endoscopy;  Laterality: N/A;  . DILATION AND CURETTAGE OF UTERUS  X 2   "when I was going thru menopause"  . ESOPHAGOGASTRODUODENOSCOPY (EGD) WITH PROPOFOL N/A 04/22/2013   Procedure: ESOPHAGOGASTRODUODENOSCOPY (EGD) WITH PROPOFOL;  Surgeon: [name], MD;  Location: [facility] ENDOSCOPY;  Service: Endoscopy;  Laterality: N/A;  . HERNIA REPAIR    . INCISIONAL HERNIA REPAIR    . INSERT / REPLACE / REMOVE PACEMAKER  06/2007   "took out the old; put in new"  . INSERT / REPLACE / REMOVE PACEMAKER  05/10/2014   MDT PPM generator change by Dr [name]  . JOINT REPLACEMENT    . PARTIAL NEPHRECTOMY Left 05/1974   stone disease  . PERMANENT PACEMAKER GENERATOR CHANGE N/A 05/10/2014   Procedure: PERMANENT PACEMAKER GENERATOR CHANGE;  Surgeon: Thompson Grayer, MD;  Location: [facility] CATH LAB;  Service: Cardiovascular;  Laterality: N/A;  . TONSILLECTOMY AND ADENOIDECTOMY  1930's  . TOTAL KNEE ARTHROPLASTY Right 2001    Family History  Problem Relation Age of Onset  . Malignant hypertension Father   . Hypertension Father   . Renal Disease Father   . Breast cancer Mother   . Heart attack Brother   . Stroke Brother     Social History   Social History  . Marital status: Married    Spouse name: N/A  . Number of children: 2  . Years of education: N/A   Occupational History  . RETIRED Retired    Statistician business   Social History Main Topics  . Smoking status: Never Smoker  . Smokeless tobacco: Never Used    . Alcohol use 4.2 oz/week    7 Glasses of wine per week  . Drug use: No  . Sexual activity: Not Currently    Partners: Male   Other Topics Concern  . None   Social History Narrative   Diet? Low salt, low fat      Do you drink/eat things with caffeine? yes      Marital status?                 married                   What year were you married? 1949      Do you live in a house, apartment, assisted living, condo, trailer, etc.? apartment      Is it one or more stories? 3 stories      How many persons live in your home? Just me      Do you have any pets in your home? (please list) no      Current or past profession: accounting      Do you exercise?          yes                            Type & how often? Walk, class, prescribed daily      Do you have a living will? yes      Do you have a DNR form?     yes                             If not, do you want to discuss one?      Do you have signed POA/HPOA for forms? no             reports that she has never smoked. She has never used smokeless tobacco. She reports that she drinks about 4.2 oz of alcohol per week . She reports that she does not use drugs.  Allergies as of 03/26/2016      Reactions   Brimonidine Tartrate-timolol Other (See Comments)   REACTION: systemic malaise amigen eye drop   Penicillins Hives   Breo Ellipta [fluticasone Furoate-vilanterol] Other (See Comments)   Ran blood pressure up      Medication List       Accurate as of  03/26/16 12:05 PM. Always use your most recent med list.          albuterol 108 (90 Base) MCG/ACT inhaler Commonly known as:  PROVENTIL HFA;VENTOLIN HFA Inhale 2 puffs into the lungs every 6 (six) hours as needed for wheezing or shortness of breath.   Fluticasone-Salmeterol 250-50 MCG/DOSE Aepb Commonly known as:  ADVAIR Inhale 1 puff into the lungs 2 (two) times daily.   furosemide 40 MG tablet Commonly known as:  LASIX Take 1 tablet (40 mg total) by mouth daily.    levofloxacin 500 MG tablet Commonly known as:  LEVAQUIN Take 1 tablet (500 mg total) by mouth daily.   levothyroxine 88 MCG tablet Commonly known as:  SYNTHROID, LEVOTHROID Take 88 mcg by mouth daily before breakfast.   LUTEIN PO Take by mouth. 25mg  one daily   Magnesium 250 MG Tabs Take 1 tablet by mouth daily.   metoprolol 50 MG tablet Commonly known as:  LOPRESSOR Take 1.5 tablets (75 mg total) by mouth every evening.   montelukast 10 MG tablet Commonly known as:  SINGULAIR Take 1 tablet by mouth  daily   potassium chloride SA 20 MEQ tablet Commonly known as:  K-DUR,KLOR-CON TAKE 1 TABLET TWICE DAILY.   predniSONE 10 MG tablet Commonly known as:  DELTASONE Take 4 tabs po daily x 3 days then 3 tabs daily x3 then 2 tabs daily x3 then 1 tab daily x 3 and stop. START ON 03/21/16   PRESERVISION AREDS 2 Caps Take 1 capsule by mouth daily.   valsartan 80 MG tablet Commonly known as:  DIOVAN Take 1 tablet (80 mg total) by mouth daily.   Vitamin D 2000 units Caps Take 1 capsule by mouth daily.   XARELTO 15 MG Tabs tablet Generic drug:  Rivaroxaban Take 1 tablet by mouth  daily with supper       Review of Systems:  Review of Systems  Constitutional: Positive for malaise/fatigue. Negative for chills and fever.  HENT: Positive for hearing loss.   Eyes: Positive for blurred vision.       Macular and glaucoma; nearly blind in left eye  Respiratory: Positive for cough, sputum production and shortness of breath. Negative for hemoptysis and wheezing.        Improved  Cardiovascular: Negative for chest pain, palpitations and leg swelling.  Gastrointestinal: Negative for abdominal pain, blood in stool, constipation and melena.  Genitourinary: Negative for dysuria, frequency and urgency.       Some incontinence at hs, but ok in daytime  Musculoskeletal: Positive for back pain and joint pain. Negative for falls.  Skin: Negative for itching and rash.  Neurological:  Positive for weakness. Negative for dizziness, loss of consciousness and headaches.       Lightheadedness  Endo/Heme/Allergies: Does not bruise/bleed easily.  Psychiatric/Behavioral: Negative for depression and memory loss. The patient is not nervous/anxious and does not have insomnia.     Physical Exam: Vitals:   03/26/16 0957  BP: 120/70  Pulse: 79  Temp: 97.7 F (36.5 C)  TempSrc: Oral  SpO2: 95%  Weight: 136 lb (61.7 kg)  Height: 5\' 3"  (1.6 m)   Body mass index is 24.09 kg/m. Physical Exam  Constitutional: She is oriented to person, place, and time. She appears well-developed and well-nourished. No distress.  HENT:  Head: Normocephalic and atraumatic.  Right Ear: External ear normal.  Left Ear: External ear normal.  Nose: Nose normal.  Mouth/Throat: Oropharynx is clear and moist. No oropharyngeal exudate.  Eyes: Conjunctivae and EOM are normal. Pupils are equal, round, and reactive to light.  Neck: Neck supple. No JVD present. No thyromegaly present.  Cardiovascular: Normal rate, regular rhythm, normal heart sounds and intact distal pulses.   No murmur heard. Pulmonary/Chest: Effort normal. No respiratory distress. Right breast exhibits no inverted nipple, no mass, no nipple discharge, no skin change and no tenderness. Left breast exhibits no inverted nipple, no mass, no nipple discharge, no skin change and no tenderness.  Some rare persistent rhonchi, moist cough  Left breast with fibrocystic changes  Abdominal: Soft. Bowel sounds are normal.  Musculoskeletal: Normal range of motion. She exhibits no edema or tenderness.  Lymphadenopathy:    She has no cervical adenopathy.  Neurological: She is alert and oriented to person, place, and time. No cranial nerve deficit.  Skin: Skin is warm and dry. Capillary refill takes less than 2 seconds.  Psychiatric: She has a normal mood and affect. Her behavior is normal. Judgment and thought content normal.    Labs  reviewed: Basic Metabolic Panel: No results for input(s): NA, K, CL, CO2, GLUCOSE, BUN, CREATININE, CALCIUM, MG, PHOS, TSH in the last 8760 hours. Liver Function Tests: No results for input(s): AST, ALT, ALKPHOS, BILITOT, PROT, ALBUMIN in the last 8760 hours. No results for input(s): LIPASE, AMYLASE in the last 8760 hours. No results for input(s): AMMONIA in the last 8760 hours. CBC: No results for input(s): WBC, NEUTROABS, HGB, HCT, MCV, PLT in the last 8760 hours. Lipid Panel: No results for input(s): CHOL, HDL, LDLCALC, TRIG, CHOLHDL, LDLDIRECT in the last 8760 hours. No results found for: HGBA1C  Reviewed notes from specialty visits since I last saw her.    Assessment/Plan 1. Essential hypertension - bp elevated in the evenings when taking steroid taper and also was high when she restarted her advair - counseled that advair is necessary for her unfortunately despite her elevated bp and glaucoma problems - we opted to try to increase her evening lopressor to see if it will help with the elevated bps in the evenings - she is to monitor at home and call me back if readings persist over [id number] systolic in the evenings - metoprolol (LOPRESSOR) 50 MG tablet; Take 1.5 tablets (75 mg total) by mouth every evening.  Dispense: 135 tablet; Refill: 3 -check cmp with gfr   2. BRONCHITIS, CHRONIC, ACUTE EXACERBATION -seems exacerbation has resolved with levaquin and prednisone taper  3. COPD mixed type ([place]) -restart the advair 250/50 bid for ongoing therapy -keep pulmonary f/u in feb as scheduled  4. Hypothyroidism, unspecified type -check TSH and cont synthroid as ordered  5. Age-related macular degeneration, dry, both eyes -ongoing, is nearly blind in left eye and vision in both is poor  6. Bilateral ocular hypertension -glaucoma present, continue drops per Gershon Crane and [facility] eye -she wanted to switch to Dr. Ellie Lunch here but she was not taking more patients when she called--she's  going to see if the clinic nurse can get her in   7. Permanent atrial fibrillation (HCC) -has pacemaker that she is dependent on due to #8 (3rd one, she reports) -cont current regimen and to follow with cardiology  8. Complete heart block ([place]) -cont pacemaker which she is dependent on  9. Medicare annual wellness visit, subsequent -nurse assessment and functional status completed  Labs/tests ordered:  Cbc with diff, cmp with gfr, tsh before f/u in 6 wks with me  Next appt:  05/08/2016   Dravin Lance L. Abdulah Iqbal, D.O. Geriatrics  [facility] [address], [place] [address] Cell Phone (Mon-Fri 8am-5pm):  [phone] On Call:  [phone] & follow prompts after 5pm & weekends Office Phone:  [phone] Office Fax:  [phone]

## 2016-03-26 NOTE — Patient Instructions (Addendum)
Increase your evening metoprolol (lopressor) to 75mg  from 50mg .  Continue to monitor your blood pressure.  If it is still reading over 150 (top number) in the evenings, call me back and we will adjust more.    Restart your advair inhaler twice a day.  You need this to prevent having flare ups of your COPD.  Be sure to continue to drink plenty of water each day (6-8 8oz glasses). Continue your walking. Make sure you stand up slowly and pause before starting to walk in case your blood pressure drops. I don't want you to fall.

## 2016-03-27 ENCOUNTER — Ambulatory Visit: Payer: Medicare Other | Admitting: Internal Medicine

## 2016-03-28 ENCOUNTER — Other Ambulatory Visit: Payer: Self-pay | Admitting: *Deleted

## 2016-03-28 MED ORDER — METOPROLOL SUCCINATE ER 25 MG PO TB24: 75.0000 mg | ORAL_TABLET | Freq: Every evening | ORAL | 1 refills | Status: DC

## 2016-04-02 ENCOUNTER — Other Ambulatory Visit: Payer: Self-pay | Admitting: *Deleted

## 2016-04-02 DIAGNOSIS — H401131 Primary open-angle glaucoma, bilateral, mild stage: Secondary | ICD-10-CM | POA: Diagnosis not present

## 2016-04-02 DIAGNOSIS — D3132 Benign neoplasm of left choroid: Secondary | ICD-10-CM | POA: Diagnosis not present

## 2016-04-02 DIAGNOSIS — H40053 Ocular hypertension, bilateral: Secondary | ICD-10-CM | POA: Diagnosis not present

## 2016-04-02 DIAGNOSIS — Z961 Presence of intraocular lens: Secondary | ICD-10-CM | POA: Diagnosis not present

## 2016-04-02 DIAGNOSIS — H353130 Nonexudative age-related macular degeneration, bilateral, stage unspecified: Secondary | ICD-10-CM | POA: Diagnosis not present

## 2016-04-02 MED ORDER — FUROSEMIDE 40 MG PO TABS: 40.0000 mg | ORAL_TABLET | Freq: Every day | ORAL | 1 refills | Status: DC

## 2016-04-02 NOTE — Telephone Encounter (Signed)
Patient requested to be sent to [facility] to Deliver.

## 2016-04-06 ENCOUNTER — Other Ambulatory Visit: Payer: Self-pay | Admitting: Internal Medicine

## 2016-04-09 ENCOUNTER — Other Ambulatory Visit: Payer: Self-pay

## 2016-04-09 MED ORDER — MONTELUKAST SODIUM 10 MG PO TABS: 10.0000 mg | ORAL_TABLET | Freq: Every day | ORAL | 2 refills | Status: DC

## 2016-04-11 ENCOUNTER — Other Ambulatory Visit: Payer: Self-pay

## 2016-04-11 MED ORDER — MONTELUKAST SODIUM 10 MG PO TABS: 10.0000 mg | ORAL_TABLET | Freq: Every day | ORAL | 3 refills | Status: DC

## 2016-04-11 NOTE — Telephone Encounter (Signed)
Received refill request from [facility] for singulair. Rx sent to preferred pharmacy. Nothing further needed.

## 2016-04-30 ENCOUNTER — Encounter: Payer: Self-pay | Admitting: Internal Medicine

## 2016-04-30 DIAGNOSIS — E039 Hypothyroidism, unspecified: Secondary | ICD-10-CM | POA: Diagnosis not present

## 2016-04-30 DIAGNOSIS — I10 Essential (primary) hypertension: Secondary | ICD-10-CM | POA: Diagnosis not present

## 2016-04-30 DIAGNOSIS — I482 Chronic atrial fibrillation: Secondary | ICD-10-CM | POA: Diagnosis not present

## 2016-04-30 LAB — BASIC METABOLIC PANEL
BUN: 20 mg/dL (ref 4–21)
Creatinine: 0.7 mg/dL (ref 0.5–1.1)
Glucose: 85 mg/dL
Potassium: 4.7 mmol/L (ref 3.4–5.3)
Sodium: 144 mmol/L (ref 137–147)

## 2016-04-30 LAB — HEPATIC FUNCTION PANEL
ALT: 13 U/L (ref 7–35)
AST: 22 U/L (ref 13–35)
Alkaline Phosphatase: 68 U/L (ref 25–125)
Bilirubin, Total: 0.7 mg/dL

## 2016-04-30 LAB — CBC AND DIFFERENTIAL
HCT: 44 % (ref 36–46)
Hemoglobin: 13.9 g/dL (ref 12.0–16.0)
Platelets: 256 10*3/uL (ref 150–399)
WBC: 5.3 10^3/mL

## 2016-04-30 LAB — TSH: TSH: 1.05 u[IU]/mL (ref 0.41–5.90)

## 2016-05-01 ENCOUNTER — Encounter: Payer: Self-pay | Admitting: Internal Medicine

## 2016-05-08 ENCOUNTER — Encounter: Payer: Self-pay | Admitting: Internal Medicine

## 2016-05-08 ENCOUNTER — Non-Acute Institutional Stay: Payer: Medicare Other | Admitting: Internal Medicine

## 2016-05-08 VITALS — BP 148/80 | HR 78 | Temp 97.8°F | Wt 136.0 lb

## 2016-05-08 DIAGNOSIS — J449 Chronic obstructive pulmonary disease, unspecified: Secondary | ICD-10-CM

## 2016-05-08 DIAGNOSIS — E039 Hypothyroidism, unspecified: Secondary | ICD-10-CM | POA: Diagnosis not present

## 2016-05-08 DIAGNOSIS — H40053 Ocular hypertension, bilateral: Secondary | ICD-10-CM

## 2016-05-08 DIAGNOSIS — I10 Essential (primary) hypertension: Secondary | ICD-10-CM | POA: Diagnosis not present

## 2016-05-08 DIAGNOSIS — H353130 Nonexudative age-related macular degeneration, bilateral, stage unspecified: Secondary | ICD-10-CM

## 2016-05-08 DIAGNOSIS — I4821 Permanent atrial fibrillation: Secondary | ICD-10-CM

## 2016-05-08 DIAGNOSIS — I482 Chronic atrial fibrillation: Secondary | ICD-10-CM

## 2016-05-08 MED ORDER — METOPROLOL SUCCINATE ER 100 MG PO TB24: 100.0000 mg | ORAL_TABLET | Freq: Every evening | ORAL | 5 refills | Status: DC

## 2016-05-08 MED ORDER — LEVOTHYROXINE SODIUM 88 MCG PO TABS: 88.0000 ug | ORAL_TABLET | Freq: Every day | ORAL | 3 refills | Status: DC

## 2016-05-08 NOTE — Progress Notes (Signed)
Location:  Occupational psychologist of Service:  Clinic (12)  Provider: Cynitha Berte L. Mariea Clonts, D.O., C.M.D.  Code Status: DNR (see document list) Goals of Care:  Advanced Directives 05/08/2016  Does Patient Have a Medical Advance Directive? Yes  Type of Paramedic of [place];Out of facility DNR (pink MOST or yellow form)  Does patient want to make changes to medical advance directive? No - Patient declined  Copy of [place] in Chart? Yes  Would patient like information on creating a medical advance directive? -  Pre-existing out of facility DNR order (yellow form or pink MOST form) Yellow form placed in chart (order not valid for inpatient use)     Chief Complaint  Patient presents with  . Medical Management of Chronic Issues    6 week follow-up, increase in BP    HPI: Patient is a 83 y.o. female seen today for medical management of chronic diseases.    Has COPD, and every inhaler she is given causes her BP to increase. At last visit her BP meds were increased (metopropol succinate 75 mg daily at night, Diovan 80 mg in morning, 40 mg Lasix daily in morning). Has a log today of her BP trends (0730am: 171/100, 0930: 131/77, 9:30 pm 165/102). Today it was 175/109 before coming to her appointment,  although 140/90 at office check.  Is taking Advair for COPD. Denies CP, but reports SOB with walking, which is baseline. Some swelling in legs, although not wearing her stockings due to a crack in her left heel.   COPD well managed at this time with Advair.   Would like to discuss results of kidney function.   Has managed to stay well despite the ongoing viruses. Lives in independent living, has been walking 30 mins daily inside. Also, does exercise classes MWF. Is sleeping well, and no falls.    Past Medical History:  Diagnosis Date  . Anemia   . Aortic stenosis    a. Echo 09/06/12 EF 55-60%, no WMA, G2DD, Ao valve sclerosis w/ mod  stenosis, LA mildly dilated, PA pressure 75mmHg  . Asthmatic bronchitis   . Complete heart block [facility])    s/p permanent pacemaker 06/27/1999 (Battery change 06/2007 and 2016).  s/p AV nodal ablation by Dr [name] 2016.  [name] COPD (chronic obstructive pulmonary disease) (HCC)    Dr. [name]  . DDD (degenerative disc disease)   . Diastolic dysfunction    a. Echo 09/06/12 EF 55-60%, no WMA, G2DD, Ao valve sclerosis w/ mod stenosis, LA mildly dilated, PA pressure 63mmHg  . Diverticulosis   . DVT (deep vein thrombosis) in pregnancy [facility]) 1954   a. LLE  . Hypertension   . Hypothyroidism    on medication  . Macular degeneration    Dr. [name]  . Osteoarthrosis, unspecified whether generalized or localized, other specified sites   . PAF (paroxysmal atrial fibrillation) (HCC)    Stopped flecainide, on amiodarone but still has bouts of A FIB (mostly in mornings).   . Pure hypercholesterolemia   . Staghorn calculus    Left    Past Surgical History:  Procedure Laterality Date  . APPENDECTOMY  ~ 1941  . AV NODE ABLATION  05/10/2014  . AV NODE ABLATION N/A 05/10/2014   Procedure: AV NODE ABLATION;  Surgeon: Thompson Grayer, MD;  Location: [facility] CATH LAB;  Service: Cardiovascular;  Laterality: N/A;  . BUNIONECTOMY WITH HAMMERTOE RECONSTRUCTION Bilateral ~ 1990  . CARDIAC PACEMAKER PLACEMENT  06/27/99   Medtronic PM implanted by Dr [name]  . CARDIOVERSION N/A 12/02/2013   Procedure: CARDIOVERSION;  Surgeon: [name], MD;  Location: [facility] ENDOSCOPY;  Service: Cardiovascular;  Laterality: N/A;  . CATARACT EXTRACTION W/ INTRAOCULAR LENS  IMPLANT, BILATERAL Bilateral   . COLONOSCOPY WITH PROPOFOL N/A 04/22/2013   Procedure: COLONOSCOPY WITH PROPOFOL;  Surgeon: [name], MD;  Location: [facility] ENDOSCOPY;  Service: Endoscopy;  Laterality: N/A;  . DILATION AND CURETTAGE OF UTERUS  X 2   "when I was going thru menopause"  . ESOPHAGOGASTRODUODENOSCOPY (EGD) WITH PROPOFOL N/A 04/22/2013   Procedure:  ESOPHAGOGASTRODUODENOSCOPY (EGD) WITH PROPOFOL;  Surgeon: [name], MD;  Location: [facility] ENDOSCOPY;  Service: Endoscopy;  Laterality: N/A;  . HERNIA REPAIR    . INCISIONAL HERNIA REPAIR    . INSERT / REPLACE / REMOVE PACEMAKER  06/2007   "took out the old; put in new"  . INSERT / REPLACE / REMOVE PACEMAKER  05/10/2014   MDT PPM generator change by Dr [name]  . JOINT REPLACEMENT    . PARTIAL NEPHRECTOMY Left 05/1974   stone disease  . PERMANENT PACEMAKER GENERATOR CHANGE N/A 05/10/2014   Procedure: PERMANENT PACEMAKER GENERATOR CHANGE;  Surgeon: Thompson Grayer, MD;  Location: [facility] CATH LAB;  Service: Cardiovascular;  Laterality: N/A;  . TONSILLECTOMY AND ADENOIDECTOMY  1930's  . TOTAL KNEE ARTHROPLASTY Right 2001    Allergies  Allergen Reactions  . Brimonidine Tartrate-Timolol Other (See Comments)    REACTION: systemic malaise amigen eye drop  . Penicillins Hives  . Breo Ellipta [Fluticasone Furoate-Vilanterol] Other (See Comments)    Ran blood pressure up    Allergies as of 05/08/2016      Reactions   Brimonidine Tartrate-timolol Other (See Comments)   REACTION: systemic malaise amigen eye drop   Penicillins Hives   Breo Ellipta [fluticasone Furoate-vilanterol] Other (See Comments)   Ran blood pressure up      Medication List       Accurate as of 05/08/16 11:02 AM. Always use your most recent med list.          albuterol 108 (90 Base) MCG/ACT inhaler Commonly known as:  PROVENTIL HFA;VENTOLIN HFA Inhale 2 puffs into the lungs every 6 (six) hours as needed for wheezing or shortness of breath.   Biotin 1 MG Caps Take by mouth daily.   Fluticasone-Salmeterol 250-50 MCG/DOSE Aepb Commonly known as:  ADVAIR Inhale 1 puff into the lungs 2 (two) times daily.   furosemide 40 MG tablet Commonly known as:  LASIX Take 1 tablet (40 mg total) by mouth daily.   levothyroxine 88 MCG tablet Commonly known as:  SYNTHROID, LEVOTHROID Take 88 mcg by mouth daily before breakfast.     LUTEIN PO Take by mouth. 25mg  one daily   Magnesium 250 MG Tabs Take 1 tablet by mouth daily.   metoprolol succinate 25 MG 24 hr tablet Commonly known as:  TOPROL-XL Take 3 tablets (75 mg total) by mouth every evening. Take with or immediately following a meal.   montelukast 10 MG tablet Commonly known as:  SINGULAIR Take 1 tablet (10 mg total) by mouth daily.   potassium chloride SA 20 MEQ tablet Commonly known as:  K-DUR,KLOR-CON TAKE 1 TABLET TWICE DAILY.   PRESERVISION AREDS 2 Caps Take 1 capsule by mouth daily.   valsartan 80 MG tablet Commonly known as:  DIOVAN TAKE 1 TABLET BY MOUTH  DAILY   vitamin B-12 1000 MCG tablet Commonly known as:  CYANOCOBALAMIN Take  1,000 mcg by mouth daily.   Vitamin D 2000 units Caps Take 1 capsule by mouth daily.   XARELTO 15 MG Tabs tablet Generic drug:  Rivaroxaban Take 1 tablet by mouth  daily with supper       Review of Systems:  Review of Systems  Constitutional: Negative for activity change and fatigue.  Respiratory: Positive for shortness of breath. Negative for cough, chest tightness, wheezing and stridor.   Cardiovascular: Positive for leg swelling. Negative for chest pain and palpitations.       Has pacemaker  Skin: Positive for wound.       Reports crack to left heel   Neurological: Negative for dizziness, syncope and headaches.    Health Maintenance  Topic Date Due  . TETANUS/TDAP  02/15/1947  . ZOSTAVAX  02/15/1988  . DEXA SCAN  02/14/1993  . PNA vac Low Risk Adult (2 of 2 - PCV13) 11/24/2014  . INFLUENZA VACCINE  Completed    Physical Exam: Vitals:   05/08/16 1051  BP: (!) 148/80  Pulse: 78  Temp: 97.8 F (36.6 C)  TempSrc: Oral  SpO2: 97%  Weight: 136 lb (61.7 kg)   Body mass index is 24.09 kg/m. Physical Exam  Constitutional: She is oriented to person, place, and time. She appears well-developed and well-nourished. No distress.  Cardiovascular: Normal rate, regular rhythm and normal heart  sounds.   See HPI for VS trends from home readings  Pulmonary/Chest: Effort normal and breath sounds normal. No respiratory distress. She has no wheezes. She has no rales.  Neurological: She is alert and oriented to person, place, and time.  Skin: Skin is warm and dry.  Psychiatric: She has a normal mood and affect. Her behavior is normal. Judgment and thought content normal.  Vitals reviewed.   Labs reviewed: Basic Metabolic Panel:  Recent Labs  04/30/16 1057  NA 144  K 4.7  BUN 20  CREATININE 0.7  TSH 1.05   Liver Function Tests:  Recent Labs  04/30/16 1057  AST 22  ALT 13  ALKPHOS 68   No results for input(s): LIPASE, AMYLASE in the last 8760 hours. No results for input(s): AMMONIA in the last 8760 hours. CBC:  Recent Labs  04/30/16 1057  WBC 5.3  HGB 13.9  HCT 44  PLT 256    Assessment/Plan 1. Acquired hypothyroidism -TSH therapeutic, cont same dose - levothyroxine (SYNTHROID, LEVOTHROID) 88 MCG tablet; Take 1 tablet (88 mcg total) by mouth daily before breakfast.  Dispense: 90 tablet; Refill: 3  2. Essential hypertension -bps remaining elevated early am and late pm -increase toprol to 100mg  in the evening to see if that helps -if still not effective, will need to add another agent or change diovan  3. COPD mixed type ([place]) -ongoing, stable lately with advair, cont same regimen, keep pulmonary f/u  4. Age-related macular degeneration, dry, both eyes -vision poor, but still able to drive, read  5. Permanent atrial fibrillation (HCC) -follows with Dr. [name], cont xarelto and toprol  6. Bilateral ocular hypertension -said to be stable, f/u with ophtho regularly   Labs/tests ordered:  No orders of the defined types were placed in this encounter.  Next appt:  07/31/2016

## 2016-05-20 ENCOUNTER — Ambulatory Visit (INDEPENDENT_AMBULATORY_CARE_PROVIDER_SITE_OTHER): Payer: Medicare Other | Admitting: *Deleted

## 2016-05-20 DIAGNOSIS — I442 Atrioventricular block, complete: Secondary | ICD-10-CM | POA: Diagnosis not present

## 2016-05-21 ENCOUNTER — Encounter: Payer: Self-pay | Admitting: Cardiology

## 2016-05-21 LAB — CUP PACEART REMOTE DEVICE CHECK
Battery Impedance: 134 Ohm
Battery Remaining Longevity: 141 mo
Brady Statistic RV Percent Paced: 99 %
Implantable Lead Implant Date: 20090318
Implantable Lead Location: 753859
Implantable Lead Model: 5076
Lead Channel Impedance Value: 684 Ohm
Lead Channel Setting Pacing Pulse Width: 0.4 ms
Lead Channel Setting Sensing Sensitivity: 4 mV
MDC IDC LEAD IMPLANT DT: 20090318
MDC IDC LEAD LOCATION: 753860
MDC IDC MSMT BATTERY VOLTAGE: 2.8 V
MDC IDC MSMT LEADCHNL RA IMPEDANCE VALUE: 67 Ohm
MDC IDC MSMT LEADCHNL RV PACING THRESHOLD AMPLITUDE: 1.25 V
MDC IDC MSMT LEADCHNL RV PACING THRESHOLD PULSEWIDTH: 0.4 ms
MDC IDC PG IMPLANT DT: 20160202
MDC IDC SESS DTM: 20180212142414
MDC IDC SET LEADCHNL RV PACING AMPLITUDE: 2.5 V

## 2016-05-21 NOTE — Progress Notes (Signed)
Remote pacemaker transmission.   

## 2016-05-27 DIAGNOSIS — H401131 Primary open-angle glaucoma, bilateral, mild stage: Secondary | ICD-10-CM | POA: Diagnosis not present

## 2016-06-05 ENCOUNTER — Encounter: Payer: Self-pay | Admitting: Internal Medicine

## 2016-06-05 ENCOUNTER — Ambulatory Visit (INDEPENDENT_AMBULATORY_CARE_PROVIDER_SITE_OTHER): Payer: Medicare Other | Admitting: Internal Medicine

## 2016-06-05 DIAGNOSIS — I4821 Permanent atrial fibrillation: Secondary | ICD-10-CM

## 2016-06-05 DIAGNOSIS — J3089 Other allergic rhinitis: Secondary | ICD-10-CM | POA: Diagnosis not present

## 2016-06-05 DIAGNOSIS — J449 Chronic obstructive pulmonary disease, unspecified: Secondary | ICD-10-CM

## 2016-06-05 DIAGNOSIS — I482 Chronic atrial fibrillation: Secondary | ICD-10-CM

## 2016-06-05 DIAGNOSIS — J302 Other seasonal allergic rhinitis: Secondary | ICD-10-CM

## 2016-06-05 MED ORDER — AZELASTINE-FLUTICASONE 137-50 MCG/ACT NA SUSP: 2.0000 | Freq: Every day | NASAL | 0 refills | Status: DC

## 2016-06-05 MED ORDER — AZELASTINE-FLUTICASONE 137-50 MCG/ACT NA SUSP: 1.0000 | Freq: Every day | NASAL | 99 refills | Status: DC

## 2016-06-05 NOTE — Assessment & Plan Note (Addendum)
She describes a stable persistent mildly productive cough which doesn't seem to bother her. I do not feel we had enough indication to get CXR at this visit. Plan-continue current meds

## 2016-06-05 NOTE — Progress Notes (Signed)
Subjective:    Patient ID: Jody Blake, female    DOB: 1925/12/14, 83 y.o.   MRN: [id number]  HPI F never smoker, followed for allergic rhinitis, chronic bronchitis, complicated by GERD, Hx PAfib/ pacemaker. ------------------------------------------------------------------------------------------------  02/01/2016-83 year old female never smoker followed for allergic rhinitis, chronic bronchitis, complicated by GERD, history P.A fib/pacemaker, glaucoma follow up. pt has chest congestion. pt has shortness of breath. pt has run out of inhaler.  LOV-NP-12/21/2015-blamed Breo inhaler for high blood pressure. Presented with increased cough and wheeze. Treated with Z-Pak, Mucinex DM, prednisone taper. Started on Qvar to replace Breo. She thinks Qvar may have helped some before she ran out. For the past week has had increased cough and wheeze. We had changed lisinopril to valsartan and without obvious effect. 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 She does have glaucoma. Insurance wouldn't cover [name] so we tried [employer]. The other covered option is Advair. CXR 06/15/2015 IMPRESSION: 1. Cardiomegaly without pulmonary edema. 2. Small left pleural effusion, significantly improved over prior. Electronically Signed   By: Nolon Nations M.D.   On: 06/15/2015 14:02  06/05/2016- 83 year old female never smoker followed for Allergic rhinitis, chronic bronchitis, complicated by GERD, history AFib/pacemaker, glaucoma FOLLOWS FOR: Pt states she has been doing well; has slight cough in the mornings but usually stops quickly. Pt has runny nose all the time. Cough productive clear sputum. Says she feels pretty well for her age-not acutely ill and doesn't feel she has an infection. Mentions soreness/ache to the left of lumbar spine with history of partial left nephrectomy and repair of the incision. I asked her to mention this to her primary physician who may want to get a CT scan of the  area. Continues Advair with only rare need for rescue inhaler.  ROS-see HPI   Negative unless "+" Constitutional:    weight loss, night sweats, fevers, chills, fatigue, lassitude. HEENT:    headaches, difficulty swallowing, tooth/dental problems, sore throat,       sneezing, itching, ear ache, nasal congestion, post nasal drip, snoring CV:    chest pain, orthopnea, PND, swelling in lower extremities, anasarca,                                                     dizziness, palpitations Resp:   shortness of breath with exertion or at rest.                +roductive cough,   +non-productive cough, coughing up of blood.              change in color of mucus.  +heezing.   Skin:    rash or lesions. GI:  No-   heartburn, indigestion, abdominal pain, nausea, vomiting,  GU:  MS:   joint pain, stiffness,  Neuro-     nothing unusual Psych:  change in mood or affect.  depression or anxiety.   memory loss.  Objective:  OBJ- Physical Exam General- Alert, Oriented, Affect-appropriate, Distress- none acute Skin- rash-none, lesions- none, excoriation- none Lymphadenopathy- none Head- atraumatic            Eyes- Gross vision intact, PERRLA, conjunctivae and secretions clear            Ears- + Hard of hearing            Nose- Clear, no-Septal  dev, mucus, polyps, erosion, perforation             Throat- Mallampati II , mucosa clear , drainage- none, tonsils- atrophic Neck- flexible , trachea midline, no stridor , thyroid nl, carotid no bruit Chest - symmetrical excursion , unlabored           Heart/CV- RRR , no murmur , no gallop  , no rub, nl s1 s2                           - JVD- none , edema- none, stasis changes- none, varices- none           Lung-  wheeze+, cough+light , dullness -none, rub- none           Chest wall-+ left pacemaker. ? Mild discomfort with firm pressure to left lumbar area Abd-   Br/ Gen/ Rectal- Not done, not indicated Extrem- cyanosis- none, clubbing, none, atrophy- none,  strength- nl Neuro- grossly intact to observatio    Assessment & Plan:

## 2016-06-05 NOTE — Assessment & Plan Note (Signed)
Rhythm is controlled by pacemaker. Managed by cardiology.

## 2016-06-05 NOTE — Patient Instructions (Signed)
Sample and printed script  Dymista nasal spray    Try 1-2 puffs each nostril once or twice daily while needed. See if it helps the runny nose.  I suggest you tell your primary doctor about the ache in your left kidney area. She might want to get a CT scan, since you have had problems there before.  Ok to call for refills as needed

## 2016-06-05 NOTE — Assessment & Plan Note (Signed)
Perennial watery rhinorrhea at her age suggests a vasomotor rhinitis rather than allergy. She has glaucoma so we will avoid ipratropium. Plan-sample Dymista nasal spray for trial

## 2016-06-11 ENCOUNTER — Other Ambulatory Visit: Payer: Self-pay | Admitting: Internal Medicine

## 2016-07-16 ENCOUNTER — Telehealth: Payer: Self-pay | Admitting: *Deleted

## 2016-07-16 DIAGNOSIS — Z0110 Encounter for examination of ears and hearing without abnormal findings: Secondary | ICD-10-CM

## 2016-07-16 NOTE — Telephone Encounter (Signed)
Patient called and stated that she needs a referral to [facility] [phone] for a Hearing Evaluation. Referral placed.

## 2016-07-31 ENCOUNTER — Non-Acute Institutional Stay: Payer: Medicare Other | Admitting: Internal Medicine

## 2016-07-31 ENCOUNTER — Encounter: Payer: Self-pay | Admitting: Internal Medicine

## 2016-07-31 VITALS — BP 140/80 | HR 65 | Temp 98.6°F | Ht 64.0 in | Wt 135.0 lb

## 2016-07-31 DIAGNOSIS — I5032 Chronic diastolic (congestive) heart failure: Secondary | ICD-10-CM

## 2016-07-31 DIAGNOSIS — E039 Hypothyroidism, unspecified: Secondary | ICD-10-CM

## 2016-07-31 DIAGNOSIS — I10 Essential (primary) hypertension: Secondary | ICD-10-CM

## 2016-07-31 DIAGNOSIS — J449 Chronic obstructive pulmonary disease, unspecified: Secondary | ICD-10-CM | POA: Diagnosis not present

## 2016-07-31 DIAGNOSIS — Z9181 History of falling: Secondary | ICD-10-CM | POA: Diagnosis not present

## 2016-07-31 DIAGNOSIS — S80212S Abrasion, left knee, sequela: Secondary | ICD-10-CM | POA: Diagnosis not present

## 2016-07-31 NOTE — Progress Notes (Signed)
Location:  [facility] clinic Provider:  Danelle Curiale L. Mariea Clonts, D.O., C.M.D.  Code Status: DNR Goals of Care:  Advanced Directives 07/31/2016  Does Patient Have a Medical Advance Directive? Yes  Type of Paramedic of [place];Out of facility DNR (pink MOST or yellow form)  Does patient want to make changes to medical advance directive? -  Copy of [place] in Chart? Yes  Would patient like information on creating a medical advance directive? -  Pre-existing out of facility DNR order (yellow form or pink MOST form) Yellow form placed in chart (order not valid for inpatient use)     Chief Complaint  Patient presents with  . Medical Management of Chronic Issues    22mth follow-up    HPI: Patient is a 83 y.o. female seen today for medical management of chronic diseases.    BP at upper limits of normal.  Took her BP at home for a week.  When she gets up it's 165-175/110, takes her medication, then under 140/80, still good later on.    COPD also has worse breathing in the morning.  Feels like mucus collects when she is laying donn.  Tolerating advair.  Also using drops for runny nose.    Had a little fall. She was reading, fell asleep, jumped up, went down on knee and skinned it about 4 wks ago.    Hypothyroidism:  tsh normal in Jan.    Chronic diastolic chf:  Continues on toprol xl, diovan, lasix, potassium.  No signs of volume overload with mild nonpitting venous insufficiency in ankles.  Past Medical History:  Diagnosis Date  . Anemia   . Aortic stenosis    a. Echo 09/06/12 EF 55-60%, no WMA, G2DD, Ao valve sclerosis w/ mod stenosis, LA mildly dilated, PA pressure 34mmHg  . Asthmatic bronchitis   . Complete heart block [facility])    s/p permanent pacemaker 06/27/1999 (Battery change 06/2007 and 2016).  s/p AV nodal ablation by Dr [name] 2016.  [name] COPD (chronic obstructive pulmonary disease) (HCC)    Dr. [name]  . DDD (degenerative disc disease)   .  Diastolic dysfunction    a. Echo 09/06/12 EF 55-60%, no WMA, G2DD, Ao valve sclerosis w/ mod stenosis, LA mildly dilated, PA pressure 21mmHg  . Diverticulosis   . DVT (deep vein thrombosis) in pregnancy [facility] 1954   a. LLE  . Hypertension   . Hypothyroidism    on medication  . Macular degeneration    Dr. [name]  . Osteoarthrosis, unspecified whether generalized or localized, other specified sites   . PAF (paroxysmal atrial fibrillation) (HCC)    Stopped flecainide, on amiodarone but still has bouts of A FIB (mostly in mornings).   . Pure hypercholesterolemia   . Staghorn calculus    Left    Past Surgical History:  Procedure Laterality Date  . APPENDECTOMY  ~ 1941  . AV NODE ABLATION  05/10/2014  . AV NODE ABLATION N/A 05/10/2014   Procedure: AV NODE ABLATION;  Surgeon: Thompson Grayer, MD;  Location: [facility] CATH LAB;  Service: Cardiovascular;  Laterality: N/A;  . BUNIONECTOMY WITH HAMMERTOE RECONSTRUCTION Bilateral ~ 1990  . CARDIAC PACEMAKER PLACEMENT  06/27/99   Medtronic PM implanted by Dr [name]  . CARDIOVERSION N/A 12/02/2013   Procedure: CARDIOVERSION;  Surgeon: [name], MD;  Location: [place];  Service: Cardiovascular;  Laterality: N/A;  . CATARACT EXTRACTION W/ INTRAOCULAR LENS  IMPLANT, BILATERAL Bilateral   . COLONOSCOPY WITH PROPOFOL N/A  04/22/2013   Procedure: COLONOSCOPY WITH PROPOFOL;  Surgeon: [name], MD;  Location: [facility] ENDOSCOPY;  Service: Endoscopy;  Laterality: N/A;  . DILATION AND CURETTAGE OF UTERUS  X 2   "when I was going thru menopause"  . ESOPHAGOGASTRODUODENOSCOPY (EGD) WITH PROPOFOL N/A 04/22/2013   Procedure: ESOPHAGOGASTRODUODENOSCOPY (EGD) WITH PROPOFOL;  Surgeon: [name], MD;  Location: [facility] ENDOSCOPY;  Service: Endoscopy;  Laterality: N/A;  . HERNIA REPAIR    . INCISIONAL HERNIA REPAIR    . INSERT / REPLACE / REMOVE PACEMAKER  06/2007   "took out the old; put in new"  . INSERT / REPLACE / REMOVE PACEMAKER  05/10/2014   MDT PPM generator  change by Dr [name]  . JOINT REPLACEMENT    . PARTIAL NEPHRECTOMY Left 05/1974   stone disease  . PERMANENT PACEMAKER GENERATOR CHANGE N/A 05/10/2014   Procedure: PERMANENT PACEMAKER GENERATOR CHANGE;  Surgeon: Thompson Grayer, MD;  Location: [facility] CATH LAB;  Service: Cardiovascular;  Laterality: N/A;  . TONSILLECTOMY AND ADENOIDECTOMY  1930's  . TOTAL KNEE ARTHROPLASTY Right 2001    Allergies  Allergen Reactions  . Brimonidine Tartrate-Timolol Other (See Comments)    REACTION: systemic malaise amigen eye drop  . Penicillins Hives  . Breo Ellipta [Fluticasone Furoate-Vilanterol] Other (See Comments)    Ran blood pressure up    Allergies as of 07/31/2016      Reactions   Brimonidine Tartrate-timolol Other (See Comments)   REACTION: systemic malaise amigen eye drop   Penicillins Hives   Breo Ellipta [fluticasone Furoate-vilanterol] Other (See Comments)   Ran blood pressure up      Medication List       Accurate as of 07/31/16  4:31 PM. Always use your most recent med list.          albuterol 108 (90 Base) MCG/ACT inhaler Commonly known as:  PROVENTIL HFA;VENTOLIN HFA Inhale 2 puffs into the lungs every 6 (six) hours as needed for wheezing or shortness of breath.   Azelastine-Fluticasone 137-50 MCG/ACT Susp Commonly known as:  DYMISTA Place 1-2 puffs into the nose at bedtime.   Azelastine-Fluticasone 137-50 MCG/ACT Susp Commonly known as:  DYMISTA Place 2 sprays into both nostrils at bedtime.   Biotin 1 MG Caps Take by mouth daily.   Fluticasone-Salmeterol 250-50 MCG/DOSE Aepb Commonly known as:  ADVAIR Inhale 1 puff into the lungs 2 (two) times daily.   furosemide 40 MG tablet Commonly known as:  LASIX Take 1 tablet (40 mg total) by mouth daily.   levothyroxine 88 MCG tablet Commonly known as:  SYNTHROID, LEVOTHROID Take 1 tablet (88 mcg total) by mouth daily before breakfast.   LUTEIN PO Take by mouth. 25mg  one daily   Magnesium 250 MG Tabs Take 1 tablet by  mouth daily.   metoprolol succinate 100 MG 24 hr tablet Commonly known as:  TOPROL-XL Take 1 tablet (100 mg total) by mouth every evening. Take with or immediately following a meal.   montelukast 10 MG tablet Commonly known as:  SINGULAIR Take 1 tablet (10 mg total) by mouth daily.   potassium chloride SA 20 MEQ tablet Commonly known as:  K-DUR,KLOR-CON TAKE 1 TABLET TWICE DAILY.   PRESERVISION AREDS 2 Caps Take 1 capsule by mouth daily.   valsartan 80 MG tablet Commonly known as:  DIOVAN TAKE 1 TABLET BY MOUTH  DAILY   vitamin B-12 1000 MCG tablet Commonly known as:  CYANOCOBALAMIN Take 1,000 mcg by mouth daily.   Vitamin D 2000 units  Caps Take 1 capsule by mouth daily.   XARELTO 15 MG Tabs tablet Generic drug:  Rivaroxaban Take 1 tablet by mouth  daily with supper       Review of Systems:  Review of Systems  Constitutional: Negative for chills, fever and malaise/fatigue.  HENT: Positive for hearing loss.   Eyes: Positive for blurred vision.  Respiratory: Negative for cough, shortness of breath and wheezing.        Copd worse in am  Cardiovascular: Negative for chest pain, palpitations and leg swelling.  Gastrointestinal: Negative for abdominal pain, blood in stool, constipation and melena.  Genitourinary: Negative for dysuria.  Musculoskeletal: Positive for falls.  Skin:       Abrasion left knee, healing  Neurological: Negative for dizziness, loss of consciousness and weakness.  Psychiatric/Behavioral: Negative for depression and memory loss. The patient is not nervous/anxious and does not have insomnia.     Health Maintenance  Topic Date Due  . TETANUS/TDAP  02/15/1947  . DEXA SCAN  02/14/1993  . PNA vac Low Risk Adult (2 of 2 - PCV13) 11/24/2014  . INFLUENZA VACCINE  11/06/2016    Physical Exam: Vitals:   07/31/16 1607  BP: 140/80  Pulse: 65  Temp: 98.6 F (37 C)  TempSrc: Oral  SpO2: 98%  Weight: 135 lb (61.2 kg)  Height: 5\' 4"  (1.626 m)    Body mass index is 23.17 kg/m. Physical Exam  Constitutional: She is oriented to person, place, and time. She appears well-developed and well-nourished. No distress.  Cardiovascular: Normal rate, regular rhythm, normal heart sounds and intact distal pulses.   Pulmonary/Chest: Effort normal and breath sounds normal. She has no wheezes.  Abdominal: Soft. Bowel sounds are normal. She exhibits no distension. There is no tenderness.  Musculoskeletal: Normal range of motion.  Walks w/o any assistive device  Neurological: She is alert and oriented to person, place, and time.  Skin: Skin is warm and dry. Capillary refill takes less than 2 seconds.  Left knee with healing abrasion/skin tear, no drainage or open areas  Psychiatric: She has a normal mood and affect.    Labs reviewed: Basic Metabolic Panel:  Recent Labs  04/30/16 1057  NA 144  K 4.7  BUN 20  CREATININE 0.7  TSH 1.05   Liver Function Tests:  Recent Labs  04/30/16 1057  AST 22  ALT 13  ALKPHOS 68   No results for input(s): LIPASE, AMYLASE in the last 8760 hours. No results for input(s): AMMONIA in the last 8760 hours. CBC:  Recent Labs  04/30/16 1057  WBC 5.3  HGB 13.9  HCT 44  PLT 256    Assessment/Plan 1. Essential hypertension -bp at goal after meds, cont same regimen, seemed to increase with steroid inhalers  2. COPD mixed type ([place]) -worse in am, but stable with advair  3. Acquired hypothyroidism -cont current levothyroxine, stable  4. Chronic diastolic congestive heart failure (HCC) -stable on current regimen, cont same  5. History of fall -seems it was a freak incident as in hpi  6. Abrasion of left knee, sequela -healing well  Labs/tests ordered:  No new Next appt:  3 mos med mgt  Deneisha Dade L. Herminio Kniskern, D.O. [facility] [address], [place] [address] Cell Phone (Mon-Fri 8am-5pm):  [phone] On Call:  [phone] & follow  prompts after 5pm & weekends Office Phone:  [phone] Office Fax:  [phone]

## 2016-08-07 ENCOUNTER — Encounter: Payer: Medicare Other | Admitting: Internal Medicine

## 2016-08-26 ENCOUNTER — Encounter (INDEPENDENT_AMBULATORY_CARE_PROVIDER_SITE_OTHER): Payer: Self-pay

## 2016-08-26 ENCOUNTER — Encounter: Payer: Self-pay | Admitting: Internal Medicine

## 2016-08-26 ENCOUNTER — Ambulatory Visit (INDEPENDENT_AMBULATORY_CARE_PROVIDER_SITE_OTHER): Payer: Medicare Other | Admitting: Internal Medicine

## 2016-08-26 VITALS — BP 140/86 | HR 86 | Ht 64.0 in | Wt 135.0 lb

## 2016-08-26 DIAGNOSIS — I10 Essential (primary) hypertension: Secondary | ICD-10-CM

## 2016-08-26 DIAGNOSIS — H401131 Primary open-angle glaucoma, bilateral, mild stage: Secondary | ICD-10-CM | POA: Diagnosis not present

## 2016-08-26 DIAGNOSIS — I482 Chronic atrial fibrillation: Secondary | ICD-10-CM | POA: Diagnosis not present

## 2016-08-26 DIAGNOSIS — I4821 Permanent atrial fibrillation: Secondary | ICD-10-CM

## 2016-08-26 DIAGNOSIS — I442 Atrioventricular block, complete: Secondary | ICD-10-CM | POA: Diagnosis not present

## 2016-08-26 LAB — CUP PACEART INCLINIC DEVICE CHECK
Battery Impedance: 134 Ohm
Battery Remaining Longevity: 141 mo
Brady Statistic RV Percent Paced: 99 %
Date Time Interrogation Session: 20180521154622
Implantable Lead Location: 753859
Implantable Lead Location: 753860
Implantable Lead Model: 5076
Lead Channel Impedance Value: 67 Ohm
Lead Channel Impedance Value: 684 Ohm
Lead Channel Pacing Threshold Pulse Width: 0.4 ms
Lead Channel Setting Pacing Amplitude: 2.5 V
Lead Channel Setting Sensing Sensitivity: 4 mV
MDC IDC LEAD IMPLANT DT: 20090318
MDC IDC LEAD IMPLANT DT: 20090318
MDC IDC MSMT BATTERY VOLTAGE: 2.8 V
MDC IDC MSMT LEADCHNL RV PACING THRESHOLD AMPLITUDE: 0.75 V
MDC IDC MSMT LEADCHNL RV PACING THRESHOLD AMPLITUDE: 1.125 V
MDC IDC MSMT LEADCHNL RV PACING THRESHOLD PULSEWIDTH: 0.4 ms
MDC IDC PG IMPLANT DT: 20160202
MDC IDC SET LEADCHNL RV PACING PULSEWIDTH: 0.4 ms

## 2016-08-26 MED ORDER — METOPROLOL SUCCINATE ER 50 MG PO TB24: 50.0000 mg | ORAL_TABLET | Freq: Every evening | ORAL | 3 refills | Status: DC

## 2016-08-26 MED ORDER — VALSARTAN 160 MG PO TABS
160.0000 mg | ORAL_TABLET | Freq: Every day | ORAL | 3 refills | Status: DC
Start: 2016-08-26 — End: 2016-10-24

## 2016-08-26 NOTE — Patient Instructions (Signed)
Medication Instructions:  INCREASE valsartan to 160mg  daily  DECREASE metoprolol to 50mg  daily  Labwork: NONE  Testing/Procedures: NONE  Follow-Up: Remote monitoring is used to monitor your Pacemaker from home. This monitoring reduces the number of office visits required to check your device to one time per year. It allows [place] to keep an eye on the functioning of your device to ensure it is working properly. You are scheduled for a device check from home on 11/25/16. You may send your transmission at any time that day. If you have a wireless device, the transmission will be sent automatically. After your physician reviews your transmission, you will receive a postcard with your next transmission date.  Your physician wants you to follow-up in: 1 YEAR with Chanetta Marshall NP. You will receive a reminder letter in the mail two months in advance. If you don't receive a letter, please call our office to schedule the follow-up appointment.   Any Other Special Instructions Will Be Listed Below (If Applicable).     If you need a refill on your cardiac medications before your next appointment, please call your pharmacy.

## 2016-08-26 NOTE — Progress Notes (Signed)
PCP: Gayland Curry, DO Primary Cardiologist:  Dr Cristy Folks Jody Blake is a 83 y.o. female who presents today for routine electrophysiology followup.  Since last being seen in our clinic, the patient reports doing reasonably well.  She has chronic difficulty with cough and SOB.  She is followed by Dr [name].  She has some fatigue.  Today, she denies symptoms of palpitations, chest pain,  lower extremity edema, dizziness, presyncope, or syncope.  The patient is otherwise without complaint today.   Past Medical History:  Diagnosis Date  . Anemia   . Aortic stenosis    a. Echo 09/06/12 EF 55-60%, no WMA, G2DD, Ao valve sclerosis w/ mod stenosis, LA mildly dilated, PA pressure 84mmHg  . Asthmatic bronchitis   . Complete heart block [facility])    s/p permanent pacemaker 06/27/1999 (Battery change 06/2007 and 2016).  s/p AV nodal ablation by Dr [name] 2016.  [name] COPD (chronic obstructive pulmonary disease) (HCC)    Dr. [name]  . DDD (degenerative disc disease)   . Diastolic dysfunction    a. Echo 09/06/12 EF 55-60%, no WMA, G2DD, Ao valve sclerosis w/ mod stenosis, LA mildly dilated, PA pressure 49mmHg  . Diverticulosis   . DVT (deep vein thrombosis) in pregnancy [facility]) 1954   a. LLE  . Hypertension   . Hypothyroidism    on medication  . Macular degeneration    Dr. [name]  . Osteoarthrosis, unspecified whether generalized or localized, other specified sites   . PAF (paroxysmal atrial fibrillation) (HCC)    Stopped flecainide, on amiodarone but still has bouts of A FIB (mostly in mornings).   . Pure hypercholesterolemia   . Staghorn calculus    Left   Past Surgical History:  Procedure Laterality Date  . APPENDECTOMY  ~ 1941  . AV NODE ABLATION  05/10/2014  . AV NODE ABLATION N/A 05/10/2014   Procedure: AV NODE ABLATION;  Surgeon: Thompson Grayer, MD;  Location: [facility] CATH LAB;  Service: Cardiovascular;  Laterality: N/A;  . BUNIONECTOMY WITH HAMMERTOE RECONSTRUCTION Bilateral ~ 1990  . CARDIAC PACEMAKER  PLACEMENT  06/27/99   Medtronic PM implanted by Dr [name]  . CARDIOVERSION N/A 12/02/2013   Procedure: CARDIOVERSION;  Surgeon: [name], MD;  Location: [facility] ENDOSCOPY;  Service: Cardiovascular;  Laterality: N/A;  . CATARACT EXTRACTION W/ INTRAOCULAR LENS  IMPLANT, BILATERAL Bilateral   . COLONOSCOPY WITH PROPOFOL N/A 04/22/2013   Procedure: COLONOSCOPY WITH PROPOFOL;  Surgeon: [name], MD;  Location: [facility] ENDOSCOPY;  Service: Endoscopy;  Laterality: N/A;  . DILATION AND CURETTAGE OF UTERUS  X 2   "when I was going thru menopause"  . ESOPHAGOGASTRODUODENOSCOPY (EGD) WITH PROPOFOL N/A 04/22/2013   Procedure: ESOPHAGOGASTRODUODENOSCOPY (EGD) WITH PROPOFOL;  Surgeon: [name], MD;  Location: [facility] ENDOSCOPY;  Service: Endoscopy;  Laterality: N/A;  . HERNIA REPAIR    . INCISIONAL HERNIA REPAIR    . INSERT / REPLACE / REMOVE PACEMAKER  06/2007   "took out the old; put in new"  . INSERT / REPLACE / REMOVE PACEMAKER  05/10/2014   MDT PPM generator change by Dr [name]  . JOINT REPLACEMENT    . PARTIAL NEPHRECTOMY Left 05/1974   stone disease  . PERMANENT PACEMAKER GENERATOR CHANGE N/A 05/10/2014   Procedure: PERMANENT PACEMAKER GENERATOR CHANGE;  Surgeon: Thompson Grayer, MD;  Location: [facility] CATH LAB;  Service: Cardiovascular;  Laterality: N/A;  . TONSILLECTOMY AND ADENOIDECTOMY  1930's  . TOTAL KNEE ARTHROPLASTY Right 2001    ROS-  all systems are reviewed and negative except as per HPI above  Current Outpatient Prescriptions  Medication Sig Dispense Refill  . albuterol (PROVENTIL HFA;VENTOLIN HFA) 108 (90 Base) MCG/ACT inhaler Inhale 2 puffs into the lungs every 6 (six) hours as needed for wheezing or shortness of breath. 1 Inhaler 12  . Azelastine-Fluticasone (DYMISTA) 137-50 MCG/ACT SUSP Place 1-2 puffs into the nose at bedtime. 1 Bottle prn  . Biotin 1 MG CAPS Take by mouth as directed.     . Cholecalciferol (VITAMIN D) 2000 units CAPS Take 1 capsule by mouth daily.    .  Fluticasone-Salmeterol (ADVAIR) 250-50 MCG/DOSE AEPB Inhale 1 puff into the lungs 2 (two) times daily.    . furosemide (LASIX) 40 MG tablet Take 1 tablet (40 mg total) by mouth daily. 90 tablet 1  . levothyroxine (SYNTHROID, LEVOTHROID) 88 MCG tablet Take 1 tablet (88 mcg total) by mouth daily before breakfast. 90 tablet 3  . LUTEIN PO Take 25 mg by mouth daily.     . Magnesium 250 MG TABS Take 1 tablet by mouth daily.    . metoprolol succinate (TOPROL-XL) 100 MG 24 hr tablet Take 1 tablet (100 mg total) by mouth every evening. Take with or immediately following a meal. 30 tablet 5  . montelukast (SINGULAIR) 10 MG tablet Take 1 tablet (10 mg total) by mouth daily. 90 tablet 3  . Multiple Vitamins-Minerals (PRESERVISION AREDS 2) CAPS Take 1 capsule by mouth daily.    . potassium chloride SA (K-DUR,KLOR-CON) 20 MEQ tablet Take 20 mEq by mouth daily.     . valsartan (DIOVAN) 80 MG tablet TAKE 1 TABLET BY MOUTH  DAILY 90 tablet 1  . vitamin B-12 (CYANOCOBALAMIN) 1000 MCG tablet Take 1,000 mcg by mouth daily.    Alveda Reasons 15 MG TABS tablet Take 1 tablet by mouth  daily with supper 90 tablet 3   No current facility-administered medications for this visit.     Physical Exam: Vitals:   08/26/16 1406  BP: 140/86  Pulse: 86  Weight: 135 lb (61.2 kg)  Height: 5\' 4"  (1.626 m)    GEN- The patient is well appearing, alert and oriented x 3 today.   Head- normocephalic, atraumatic Eyes-  Sclera clear, conjunctiva pink Ears- hearing intact Oropharynx- clear Lungs- Clear to ausculation bilaterally, normal work of breathing Chest- pacemaker pocket is well healed Heart- Regular rate and rhythm, no murmurs, rubs or gallops, PMI not laterally displaced GI- soft, NT, ND, + BS Extremities- no clubbing, cyanosis, or edema  Pacemaker interrogation- reviewed in detail today,  See PACEART report  Assessment and Plan:  1. Complete heart block Normal pacemaker function See Pace Art report No changes  today Histogram looks good  2. Permanent afib On xarelto PCP following CrCl  3. Hypertension Given fatigue, will reduce toprol XL to 50mg  daily, Increase valsartan to 160mg  daily   Remote monitoring (My [employer] Smart) Follow-up with Dr Marlou Porch in 6 months  Follow-up with EP NP in 1 year  Thompson Grayer MD, [facility] 08/26/2016 2:34 PM

## 2016-09-17 DIAGNOSIS — Z961 Presence of intraocular lens: Secondary | ICD-10-CM | POA: Diagnosis not present

## 2016-09-17 DIAGNOSIS — H40053 Ocular hypertension, bilateral: Secondary | ICD-10-CM | POA: Diagnosis not present

## 2016-09-17 DIAGNOSIS — H401131 Primary open-angle glaucoma, bilateral, mild stage: Secondary | ICD-10-CM | POA: Diagnosis not present

## 2016-09-17 DIAGNOSIS — D3132 Benign neoplasm of left choroid: Secondary | ICD-10-CM | POA: Diagnosis not present

## 2016-09-17 DIAGNOSIS — H353131 Nonexudative age-related macular degeneration, bilateral, early dry stage: Secondary | ICD-10-CM | POA: Diagnosis not present

## 2016-09-24 ENCOUNTER — Other Ambulatory Visit: Payer: Self-pay | Admitting: Internal Medicine

## 2016-09-25 NOTE — Telephone Encounter (Signed)
Age 83 years Wt 61.2kg (08/26/2016) Saw Dr [name] on 08/26/2016 04/30/2016 SrCr 0.7 04/30/2016 Hgb 13.9 HCT 44 CrCl 53.67  Refill done for Xarelto 15mg  daily Please see note from Dr Marlou Porch on 02/20/2016

## 2016-10-24 ENCOUNTER — Telehealth: Payer: Self-pay | Admitting: *Deleted

## 2016-10-24 MED ORDER — LOSARTAN POTASSIUM 100 MG PO TABS: 100.0000 mg | ORAL_TABLET | Freq: Every day | ORAL | 0 refills | Status: DC

## 2016-10-24 MED ORDER — LOSARTAN POTASSIUM 100 MG PO TABS: 100.0000 mg | ORAL_TABLET | Freq: Every day | ORAL | 3 refills | Status: DC

## 2016-10-24 NOTE — Addendum Note (Signed)
Addended by: Despina Hidden on: 10/24/2016 03:39 PM   Modules accepted: Orders

## 2016-10-24 NOTE — Telephone Encounter (Signed)
Spoke with Jody Blake ( clinic nurse at [place]) and advised results, she will call patient and let her know that the rx was sent to the pharmacy. Also that pt needs to have her BP checked in 3 days.

## 2016-10-24 NOTE — Telephone Encounter (Signed)
Patient called and stated that she received a call from her pharmacy that her medication Valsartan was recalled. Patient is calling requesting an alternative. Please Advise.

## 2016-10-24 NOTE — Telephone Encounter (Signed)
D/c valsartan 160mg  daily and begin losartan 100mg  daily.  She should see the clinic nurse at [place] for a bp check after being on the new medication for 3 days.  Goal is less than 140/90.

## 2016-10-30 ENCOUNTER — Encounter: Payer: Self-pay | Admitting: Internal Medicine

## 2016-10-30 ENCOUNTER — Non-Acute Institutional Stay: Payer: Medicare Other | Admitting: Internal Medicine

## 2016-10-30 VITALS — BP 110/70 | HR 81 | Temp 98.0°F | Wt 136.0 lb

## 2016-10-30 DIAGNOSIS — I482 Chronic atrial fibrillation: Secondary | ICD-10-CM | POA: Diagnosis not present

## 2016-10-30 DIAGNOSIS — J449 Chronic obstructive pulmonary disease, unspecified: Secondary | ICD-10-CM

## 2016-10-30 DIAGNOSIS — I10 Essential (primary) hypertension: Secondary | ICD-10-CM

## 2016-10-30 DIAGNOSIS — K120 Recurrent oral aphthae: Secondary | ICD-10-CM

## 2016-10-30 DIAGNOSIS — I5032 Chronic diastolic (congestive) heart failure: Secondary | ICD-10-CM

## 2016-10-30 DIAGNOSIS — E039 Hypothyroidism, unspecified: Secondary | ICD-10-CM | POA: Diagnosis not present

## 2016-10-30 DIAGNOSIS — Z23 Encounter for immunization: Secondary | ICD-10-CM

## 2016-10-30 DIAGNOSIS — H52203 Unspecified astigmatism, bilateral: Secondary | ICD-10-CM | POA: Diagnosis not present

## 2016-10-30 DIAGNOSIS — H401131 Primary open-angle glaucoma, bilateral, mild stage: Secondary | ICD-10-CM | POA: Diagnosis not present

## 2016-10-30 DIAGNOSIS — H353132 Nonexudative age-related macular degeneration, bilateral, intermediate dry stage: Secondary | ICD-10-CM | POA: Diagnosis not present

## 2016-10-30 DIAGNOSIS — I4821 Permanent atrial fibrillation: Secondary | ICD-10-CM

## 2016-10-30 DIAGNOSIS — Z961 Presence of intraocular lens: Secondary | ICD-10-CM | POA: Diagnosis not present

## 2016-10-30 MED ORDER — BENZOCAINE 10 % MT GEL: 1.0000 "application " | OROMUCOSAL | 3 refills | Status: DC | PRN

## 2016-10-30 MED ORDER — ZOSTER VAC RECOMB ADJUVANTED 50 MCG/0.5ML IM SUSR: 0.5000 mL | Freq: Once | INTRAMUSCULAR | 1 refills | Status: AC

## 2016-10-30 NOTE — Progress Notes (Signed)
Location:  Occupational psychologist of Service:  Clinic (12)  Provider: Lorain Fettes L. Mariea Clonts, D.O., C.M.D.  Code Status: DNR Goals of Care:  Advanced Directives 10/30/2016  Does Patient Have a Medical Advance Directive? Yes  Type of Paramedic of [place];Out of facility DNR (pink MOST or yellow form)  Does patient want to make changes to medical advance directive? -  Copy of [place] in Chart? Yes  Would patient like information on creating a medical advance directive? -  Pre-existing out of facility DNR order (yellow form or pink MOST form) Yellow form placed in chart (order not valid for inpatient use)   Chief Complaint  Patient presents with  . Medical Management of Chronic Issues    43mth follow-up    HPI: Patient is a 83 y.o. female seen today for medical management of chronic diseases.    Her valsartan had to be changed to losartan due to a recall.  BP seems to be better today since the change in medication.  Pt is surprised and says it's never that good at 110/70.  Dr. [name] had lowered her beta blocker b/c of her COPD/breathing and increased the valsartan before this occurred.  She was getting 170/115 when she got up and it would be down to 140 or 150 by this time and better by lunch.  Notes it was 10-15 pt lower with her morning readings after beginning the losartan.    She has a morning coughing fit each day.  It gets better as the day goes on.  Breathing is not terrible, but not good.  Gets out of breath walking and tries to walk 30 mins each day.  She has a prescription for dymista nasal spray for her postnasal drip, but has not been using it.  Does take advair and has prn albuterol--used 1x every 3-4 weeks.  She has taken allegra/claritin/zyrtec in the past.  She does take singulair.  She has been using plain flonase at night.    She is hard of hearing.  She has gotten new hearing aids and thinks it's as good as it's  going to get.  Does ok one on one, but struggles in a group 6-8.  Does ok with her usual group of 4 at dinner.    No difficulty with heart racing.  Has pacemaker and had AV nodal ablation.  Gets her q 3 monthly eval on her ipad.  On xarelto, metoprolol.    Hypothyroidism:  Continues on synthroid and last tsh was normal in Jan.  She gets ulcers in her mouth.  Notes more with tomatoes and peaches.  Is careful to rinse her mouth after inhalers and has not had thrush.  Past Medical History:  Diagnosis Date  . Anemia   . Aortic stenosis    a. Echo 09/06/12 EF 55-60%, no WMA, G2DD, Ao valve sclerosis w/ mod stenosis, LA mildly dilated, PA pressure 91mmHg  . Asthmatic bronchitis   . Complete heart block [facility])    s/p permanent pacemaker 06/27/1999 (Battery change 06/2007 and 2016).  s/p AV nodal ablation by Dr [name] 2016.  [name] COPD (chronic obstructive pulmonary disease) (HCC)    Dr. [name]  . DDD (degenerative disc disease)   . Diastolic dysfunction    a. Echo 09/06/12 EF 55-60%, no WMA, G2DD, Ao valve sclerosis w/ mod stenosis, LA mildly dilated, PA pressure 63mmHg  . Diverticulosis   . DVT (deep vein thrombosis) in pregnancy [facility]) 1954  a. LLE  . Hypertension   . Hypothyroidism    on medication  . Macular degeneration    Dr. [name]  . Osteoarthrosis, unspecified whether generalized or localized, other specified sites   . PAF (paroxysmal atrial fibrillation) (HCC)    Stopped flecainide, on amiodarone but still has bouts of A FIB (mostly in mornings).   . Pure hypercholesterolemia   . Staghorn calculus    Left    Past Surgical History:  Procedure Laterality Date  . APPENDECTOMY  ~ 1941  . AV NODE ABLATION  05/10/2014  . AV NODE ABLATION N/A 05/10/2014   Procedure: AV NODE ABLATION;  Surgeon: Thompson Grayer, MD;  Location: [facility] CATH LAB;  Service: Cardiovascular;  Laterality: N/A;  . BUNIONECTOMY WITH HAMMERTOE RECONSTRUCTION Bilateral ~ 1990  . CARDIAC PACEMAKER PLACEMENT  06/27/99   Medtronic  PM implanted by Dr [name]  . CARDIOVERSION N/A 12/02/2013   Procedure: CARDIOVERSION;  Surgeon: [name], MD;  Location: [facility] ENDOSCOPY;  Service: Cardiovascular;  Laterality: N/A;  . CATARACT EXTRACTION W/ INTRAOCULAR LENS  IMPLANT, BILATERAL Bilateral   . COLONOSCOPY WITH PROPOFOL N/A 04/22/2013   Procedure: COLONOSCOPY WITH PROPOFOL;  Surgeon: [name], MD;  Location: [facility] ENDOSCOPY;  Service: Endoscopy;  Laterality: N/A;  . DILATION AND CURETTAGE OF UTERUS  X 2   "when I was going thru menopause"  . ESOPHAGOGASTRODUODENOSCOPY (EGD) WITH PROPOFOL N/A 04/22/2013   Procedure: ESOPHAGOGASTRODUODENOSCOPY (EGD) WITH PROPOFOL;  Surgeon: [name], MD;  Location: [facility] ENDOSCOPY;  Service: Endoscopy;  Laterality: N/A;  . HERNIA REPAIR    . INCISIONAL HERNIA REPAIR    . INSERT / REPLACE / REMOVE PACEMAKER  06/2007   "took out the old; put in new"  . INSERT / REPLACE / REMOVE PACEMAKER  05/10/2014   MDT PPM generator change by Dr [name]  . JOINT REPLACEMENT    . PARTIAL NEPHRECTOMY Left 05/1974   stone disease  . PERMANENT PACEMAKER GENERATOR CHANGE N/A 05/10/2014   Procedure: PERMANENT PACEMAKER GENERATOR CHANGE;  Surgeon: Thompson Grayer, MD;  Location[facility] C[facility] LAB;  Service: Cardiovascular;  Laterality: N/A;  . TONSILLECTOMY AND ADENOIDECTOMY  1930's  . TOTAL KNEE ARTHROPLASTY Right 2001    Allergies  Allergen Reactions  . Brimonidine Tartrate-Timolol Other (See Comments)    REACTION: systemic malaise amigen eye drop  . Penicillins Hives  . Breo Ellipta [Fluticasone Furoate-Vilanterol] Other (See Comments)    Ran blood pressure up    Allergies as of 10/30/2016      Reactions   Brimonidine Tartrate-timolol Other (See Comments)   REACTION: systemic malaise amigen eye drop   Penicillins Hives   Breo Ellipta [fluticasone Furoate-vilanterol] Other (See Comments)   Ran blood pressure up      Medication List       Accurate as of 10/30/16  9:08 AM. Always use your most recent med  list.          albuterol 108 (90 Base) MCG/ACT inhaler Commonly known as:  PROVENTIL HFA;VENTOLIN HFA Inhale 2 puffs into the lungs every 6 (six) hours as needed for wheezing or shortness of breath.   Azelastine-Fluticasone 137-50 MCG/ACT Susp Commonly known as:  DYMISTA Place 1-2 puffs into the nose at bedtime.   Biotin 1 MG Caps Take by mouth as directed.   Fluticasone-Salmeterol 250-50 MCG/DOSE Aepb Commonly known as:  ADVAIR Inhale 1 puff into the lungs 2 (two) times daily.   furosemide 40 MG tablet Commonly known as:  LASIX Take 1 tablet (  40 mg total) by mouth daily.   levothyroxine 88 MCG tablet Commonly known as:  SYNTHROID, LEVOTHROID Take 1 tablet (88 mcg total) by mouth daily before breakfast.   losartan 100 MG tablet Commonly known as:  COZAAR Take 1 tablet (100 mg total) by mouth daily.   LUTEIN PO Take 25 mg by mouth daily.   Magnesium 250 MG Tabs Take 1 tablet by mouth daily.   metoprolol succinate 50 MG 24 hr tablet Commonly known as:  TOPROL-XL Take 1 tablet (50 mg total) by mouth every evening. Take with or immediately following a meal.   montelukast 10 MG tablet Commonly known as:  SINGULAIR Take 1 tablet (10 mg total) by mouth daily.   potassium chloride SA 20 MEQ tablet Commonly known as:  K-DUR,KLOR-CON Take 20 mEq by mouth daily.   PRESERVISION AREDS 2 Caps Take 1 capsule by mouth daily.   vitamin B-12 1000 MCG tablet Commonly known as:  CYANOCOBALAMIN Take 1,000 mcg by mouth daily.   Vitamin D 2000 units Caps Take 1 capsule by mouth daily.   XARELTO 15 MG Tabs tablet Generic drug:  Rivaroxaban TAKE 1 TABLET BY MOUTH  DAILY WITH SUPPER       Review of Systems:  Review of Systems  Constitutional: Negative for chills, fever and malaise/fatigue.  HENT: Positive for hearing loss. Negative for congestion.   Eyes:       Glasses  Respiratory: Positive for cough and shortness of breath. Negative for sputum production and wheezing.    Cardiovascular: Positive for leg swelling. Negative for chest pain and palpitations.  Gastrointestinal: Negative for abdominal pain, blood in stool, constipation and melena.  Genitourinary: Negative for dysuria.  Musculoskeletal: Negative for falls and joint pain.  Skin: Negative for itching and rash.  Neurological: Negative for dizziness, loss of consciousness and weakness.  Endo/Heme/Allergies: Bruises/bleeds easily.  Psychiatric/Behavioral: Negative for depression and memory loss.    Health Maintenance  Topic Date Due  . TETANUS/TDAP  02/15/1945  . DEXA SCAN  02/14/1993  . PNA vac Low Risk Adult (2 of 2 - PCV13) 11/24/2014  . INFLUENZA VACCINE  11/06/2016    Physical Exam: Vitals:   10/30/16 0856  BP: 110/70  Pulse: 81  Temp: 98 F (36.7 C)  TempSrc: Oral  SpO2: 98%  Weight: 136 lb (61.7 kg)   Body mass index is 23.34 kg/m. Physical Exam  Constitutional: She is oriented to person, place, and time. She appears well-developed and well-nourished. No distress.  Cardiovascular: Intact distal pulses.   irreg irreg  Pulmonary/Chest: Effort normal and breath sounds normal. She has no wheezes.  Abdominal: Soft. Bowel sounds are normal. She exhibits no distension. There is no tenderness.  Musculoskeletal: Normal range of motion. She exhibits no tenderness.  Neurological: She is alert and oriented to person, place, and time.  Skin: Skin is warm and dry.  Psychiatric: She has a normal mood and affect.    Labs reviewed: Basic Metabolic Panel:  Recent Labs  04/30/16 1057  NA 144  K 4.7  BUN 20  CREATININE 0.7  TSH 1.05   Liver Function Tests:  Recent Labs  04/30/16 1057  AST 22  ALT 13  ALKPHOS 68   No results for input(s): LIPASE, AMYLASE in the last 8760 hours. No results for input(s): AMMONIA in the last 8760 hours. CBC:  Recent Labs  04/30/16 1057  WBC 5.3  HGB 13.9  HCT 44  PLT 256   Assessment/Plan 1. Need for shingles vaccine -  Zoster Vac  Recomb Adjuvanted [facility]) injection; Inject 0.5 mLs into the muscle once.  Dispense: 0.5 mL; Refill: 1 Sent to [facility]  2. Canker sores oral - is aware of triggers, does not seem to be a thrush-type of sore, but canker sores - benzocaine (ORAJEL) 10 % mucosal gel; Use as directed 1 application in the mouth or throat as needed for mouth pain. For canker sores  Dispense: 5.3 g; Refill: 3  3. Essential hypertension -bp at goal with losartan 100mg  daily -I have counseled pt not to take her bp before her medicine in the am, but she continues to do this -my check today was 122/84 b/c she did not believe it was as low as when CMA took it  4. COPD mixed type ([place]) -cont advair and prn albuterol, plus singulair, use flonase and then switch to dymista nasal spray from Dr. [name] for postnasal drip and allergies, does not use loratadine or cetirizine  5. Acquired hypothyroidism -cont current levothyroxine, tsh normal in Jan  6. Chronic diastolic congestive heart failure (HCC) -no signs of chf at present, has dyspnea on exertion, but due to COPD -cont current regimen with ARB, beta blocker and lasix, potassium  7. Permanent atrial fibrillation (HCC) -no difficulty with RVR with AV nodal ablation, pacemaker -cont beta blocker and xarelto anticoagulant  Labs/tests ordered:  No new Next appt:  03/05/2017 for med mgt  Lennex Pietila L. Janisa Labus, D.O. [facility] [address], [place] [address] Cell Phone (Mon-Fri 8am-5pm):  [phone] On Call:  [phone] & follow prompts after 5pm & weekends Office Phone:  [phone] Office Fax:  [phone]

## 2016-11-11 ENCOUNTER — Other Ambulatory Visit: Payer: Self-pay | Admitting: Internal Medicine

## 2016-11-11 ENCOUNTER — Other Ambulatory Visit: Payer: Self-pay | Admitting: *Deleted

## 2016-11-11 MED ORDER — LOSARTAN POTASSIUM 100 MG PO TABS: 100.0000 mg | ORAL_TABLET | Freq: Every day | ORAL | 3 refills | Status: DC

## 2016-11-11 NOTE — Telephone Encounter (Signed)
Patient requested to be [facility].  

## 2016-11-14 ENCOUNTER — Emergency Department (HOSPITAL_COMMUNITY): Payer: Medicare Other

## 2016-11-14 ENCOUNTER — Telehealth: Payer: Self-pay | Admitting: *Deleted

## 2016-11-14 ENCOUNTER — Encounter (HOSPITAL_COMMUNITY): Payer: Self-pay | Admitting: Emergency Medicine

## 2016-11-14 ENCOUNTER — Observation Stay (HOSPITAL_COMMUNITY)
Admission: EM | Admit: 2016-11-14 | Discharge: 2016-11-15 | Disposition: A | Discharge status: Home / Self Care | Payer: Medicare Other | Attending: Family Medicine | Admitting: Family Medicine

## 2016-11-14 ENCOUNTER — Other Ambulatory Visit: Payer: Self-pay | Admitting: *Deleted

## 2016-11-14 DIAGNOSIS — J449 Chronic obstructive pulmonary disease, unspecified: Secondary | ICD-10-CM | POA: Diagnosis not present

## 2016-11-14 DIAGNOSIS — Z950 Presence of cardiac pacemaker: Secondary | ICD-10-CM | POA: Diagnosis not present

## 2016-11-14 DIAGNOSIS — Z803 Family history of malignant neoplasm of breast: Secondary | ICD-10-CM | POA: Insufficient documentation

## 2016-11-14 DIAGNOSIS — I480 Paroxysmal atrial fibrillation: Secondary | ICD-10-CM | POA: Insufficient documentation

## 2016-11-14 DIAGNOSIS — I82409 Acute embolism and thrombosis of unspecified deep veins of unspecified lower extremity: Secondary | ICD-10-CM

## 2016-11-14 DIAGNOSIS — Z86718 Personal history of other venous thrombosis and embolism: Secondary | ICD-10-CM | POA: Diagnosis not present

## 2016-11-14 DIAGNOSIS — I10 Essential (primary) hypertension: Secondary | ICD-10-CM | POA: Diagnosis not present

## 2016-11-14 DIAGNOSIS — Z905 Acquired absence of kidney: Secondary | ICD-10-CM | POA: Insufficient documentation

## 2016-11-14 DIAGNOSIS — M1990 Unspecified osteoarthritis, unspecified site: Secondary | ICD-10-CM | POA: Insufficient documentation

## 2016-11-14 DIAGNOSIS — N183 Chronic kidney disease, stage 3 unspecified: Secondary | ICD-10-CM | POA: Diagnosis present

## 2016-11-14 DIAGNOSIS — I5032 Chronic diastolic (congestive) heart failure: Secondary | ICD-10-CM | POA: Diagnosis present

## 2016-11-14 DIAGNOSIS — E785 Hyperlipidemia, unspecified: Secondary | ICD-10-CM | POA: Insufficient documentation

## 2016-11-14 DIAGNOSIS — K5790 Diverticulosis of intestine, part unspecified, without perforation or abscess without bleeding: Secondary | ICD-10-CM | POA: Diagnosis not present

## 2016-11-14 DIAGNOSIS — R42 Dizziness and giddiness: Secondary | ICD-10-CM | POA: Diagnosis not present

## 2016-11-14 DIAGNOSIS — I350 Nonrheumatic aortic (valve) stenosis: Secondary | ICD-10-CM | POA: Diagnosis not present

## 2016-11-14 DIAGNOSIS — I442 Atrioventricular block, complete: Secondary | ICD-10-CM | POA: Insufficient documentation

## 2016-11-14 DIAGNOSIS — Z87442 Personal history of urinary calculi: Secondary | ICD-10-CM | POA: Insufficient documentation

## 2016-11-14 DIAGNOSIS — R270 Ataxia, unspecified: Principal | ICD-10-CM

## 2016-11-14 DIAGNOSIS — Z7901 Long term (current) use of anticoagulants: Secondary | ICD-10-CM | POA: Diagnosis not present

## 2016-11-14 DIAGNOSIS — I130 Hypertensive heart and chronic kidney disease with heart failure and stage 1 through stage 4 chronic kidney disease, or unspecified chronic kidney disease: Secondary | ICD-10-CM | POA: Diagnosis not present

## 2016-11-14 DIAGNOSIS — Z9841 Cataract extraction status, right eye: Secondary | ICD-10-CM | POA: Insufficient documentation

## 2016-11-14 DIAGNOSIS — Z823 Family history of stroke: Secondary | ICD-10-CM | POA: Insufficient documentation

## 2016-11-14 DIAGNOSIS — Z9660 Presence of unspecified orthopedic joint implant: Secondary | ICD-10-CM | POA: Insufficient documentation

## 2016-11-14 DIAGNOSIS — E7800 Pure hypercholesterolemia, unspecified: Secondary | ICD-10-CM | POA: Insufficient documentation

## 2016-11-14 DIAGNOSIS — Z841 Family history of disorders of kidney and ureter: Secondary | ICD-10-CM | POA: Insufficient documentation

## 2016-11-14 DIAGNOSIS — E039 Hypothyroidism, unspecified: Secondary | ICD-10-CM | POA: Diagnosis not present

## 2016-11-14 DIAGNOSIS — O2230 Deep phlebothrombosis in pregnancy, unspecified trimester: Secondary | ICD-10-CM | POA: Insufficient documentation

## 2016-11-14 DIAGNOSIS — I482 Chronic atrial fibrillation: Secondary | ICD-10-CM | POA: Insufficient documentation

## 2016-11-14 DIAGNOSIS — Z9842 Cataract extraction status, left eye: Secondary | ICD-10-CM | POA: Diagnosis not present

## 2016-11-14 DIAGNOSIS — Z8249 Family history of ischemic heart disease and other diseases of the circulatory system: Secondary | ICD-10-CM | POA: Insufficient documentation

## 2016-11-14 DIAGNOSIS — I160 Hypertensive urgency: Secondary | ICD-10-CM | POA: Insufficient documentation

## 2016-11-14 DIAGNOSIS — H3530 Unspecified macular degeneration: Secondary | ICD-10-CM | POA: Diagnosis not present

## 2016-11-14 DIAGNOSIS — I4821 Permanent atrial fibrillation: Secondary | ICD-10-CM | POA: Diagnosis present

## 2016-11-14 DIAGNOSIS — Z79899 Other long term (current) drug therapy: Secondary | ICD-10-CM | POA: Insufficient documentation

## 2016-11-14 LAB — CBC WITH DIFFERENTIAL/PLATELET
BASOS ABS: 0.1 10*3/uL (ref 0.0–0.1)
BASOS PCT: 1 %
Eosinophils Absolute: 0.2 10*3/uL (ref 0.0–0.7)
Eosinophils Relative: 4 %
HEMATOCRIT: 43.5 % (ref 36.0–46.0)
HEMOGLOBIN: 14.4 g/dL (ref 12.0–15.0)
LYMPHS PCT: 23 %
Lymphs Abs: 1.6 10*3/uL (ref 0.7–4.0)
MCH: 31.8 pg (ref 26.0–34.0)
MCHC: 33.1 g/dL (ref 30.0–36.0)
MCV: 96 fL (ref 78.0–100.0)
MONO ABS: 0.6 10*3/uL (ref 0.1–1.0)
MONOS PCT: 8 %
NEUTROS ABS: 4.4 10*3/uL (ref 1.7–7.7)
NEUTROS PCT: 64 %
Platelets: 264 10*3/uL (ref 150–400)
RBC: 4.53 MIL/uL (ref 3.87–5.11)
RDW: 13.7 % (ref 11.5–15.5)
WBC: 6.8 10*3/uL (ref 4.0–10.5)

## 2016-11-14 LAB — URINALYSIS, ROUTINE W REFLEX MICROSCOPIC
BILIRUBIN URINE: NEGATIVE
Bacteria, UA: NONE SEEN
GLUCOSE, UA: NEGATIVE mg/dL
Hgb urine dipstick: NEGATIVE
KETONES UR: NEGATIVE mg/dL
LEUKOCYTES UA: NEGATIVE
Nitrite: NEGATIVE
PROTEIN: 30 mg/dL — AB
Specific Gravity, Urine: 1.009 (ref 1.005–1.030)
pH: 8 (ref 5.0–8.0)

## 2016-11-14 LAB — COMPREHENSIVE METABOLIC PANEL
ALK PHOS: 66 U/L (ref 38–126)
ALT: 16 U/L (ref 14–54)
AST: 29 U/L (ref 15–41)
Albumin: 4.2 g/dL (ref 3.5–5.0)
Anion gap: 9 (ref 5–15)
BILIRUBIN TOTAL: 0.7 mg/dL (ref 0.3–1.2)
BUN: 24 mg/dL — AB (ref 6–20)
CHLORIDE: 103 mmol/L (ref 101–111)
CO2: 27 mmol/L (ref 22–32)
Calcium: 9.7 mg/dL (ref 8.9–10.3)
Creatinine, Ser: 1.17 mg/dL — ABNORMAL HIGH (ref 0.44–1.00)
GFR, EST AFRICAN AMERICAN: 47 mL/min — AB (ref >60)
GFR, EST NON AFRICAN AMERICAN: 40 mL/min — AB (ref >60)
GLUCOSE: 92 mg/dL (ref 65–99)
POTASSIUM: 3.7 mmol/L (ref 3.5–5.1)
Sodium: 139 mmol/L (ref 135–145)
Total Protein: 7.2 g/dL (ref 6.5–8.1)

## 2016-11-14 LAB — RAPID URINE DRUG SCREEN, HOSP PERFORMED
Amphetamines: NOT DETECTED
Barbiturates: NOT DETECTED
Benzodiazepines: NOT DETECTED
COCAINE: NOT DETECTED
OPIATES: NOT DETECTED
Tetrahydrocannabinol: NOT DETECTED

## 2016-11-14 LAB — PROTIME-INR
INR: 1.03
Prothrombin Time: 13.5 seconds (ref 11.4–15.2)

## 2016-11-14 LAB — I-STAT TROPONIN, ED: TROPONIN I, POC: 0 ng/mL (ref 0.00–0.08)

## 2016-11-14 LAB — ETHANOL

## 2016-11-14 LAB — APTT: APTT: 29 s (ref 24–36)

## 2016-11-14 MED ORDER — MAGNESIUM OXIDE 400 (241.3 MG) MG PO TABS
200.0000 mg | ORAL_TABLET | Freq: Every day | ORAL | Status: DC
  Administered 2016-11-15: 200 mg via ORAL
  Filled 2016-11-14: qty 1

## 2016-11-14 MED ORDER — AZELASTINE HCL 0.1 % NA SOLN
1.0000 | Freq: Two times a day (BID) | NASAL | Status: DC
  Administered 2016-11-15: 2 via NASAL
  Filled 2016-11-14: qty 30

## 2016-11-14 MED ORDER — MONTELUKAST SODIUM 10 MG PO TABS
10.0000 mg | ORAL_TABLET | Freq: Every day | ORAL | Status: DC
  Administered 2016-11-15: 10 mg via ORAL
  Filled 2016-11-14: qty 1

## 2016-11-14 MED ORDER — AMLODIPINE BESYLATE 5 MG PO TABS
5.0000 mg | ORAL_TABLET | Freq: Every day | ORAL | Status: DC
  Administered 2016-11-15: 5 mg via ORAL
  Filled 2016-11-14: qty 1

## 2016-11-14 MED ORDER — SENNOSIDES-DOCUSATE SODIUM 8.6-50 MG PO TABS: 1.0000 | ORAL_TABLET | Freq: Every evening | ORAL | Status: DC | PRN

## 2016-11-14 MED ORDER — HYDRALAZINE HCL 20 MG/ML IJ SOLN: 5.0000 mg | INTRAMUSCULAR | Status: DC | PRN

## 2016-11-14 MED ORDER — AZELASTINE HCL 0.1 % NA SOLN: 1.0000 | Freq: Two times a day (BID) | NASAL | 99 refills | Status: DC

## 2016-11-14 MED ORDER — PROSIGHT PO TABS
1.0000 | ORAL_TABLET | Freq: Every day | ORAL | Status: DC
  Administered 2016-11-15: 1 via ORAL
  Filled 2016-11-14: qty 1

## 2016-11-14 MED ORDER — LOSARTAN POTASSIUM 50 MG PO TABS
100.0000 mg | ORAL_TABLET | Freq: Every day | ORAL | Status: DC
  Administered 2016-11-14: 100 mg via ORAL
  Filled 2016-11-14: qty 2

## 2016-11-14 MED ORDER — ACETAMINOPHEN 650 MG RE SUPP: 650.0000 mg | RECTAL | Status: DC | PRN

## 2016-11-14 MED ORDER — MOMETASONE FURO-FORMOTEROL FUM 200-5 MCG/ACT IN AERO
2.0000 | INHALATION_SPRAY | Freq: Two times a day (BID) | RESPIRATORY_TRACT | Status: DC
  Administered 2016-11-15 (×2): 2 via RESPIRATORY_TRACT
  Filled 2016-11-14: qty 8.8

## 2016-11-14 MED ORDER — ACETAMINOPHEN 160 MG/5ML PO SOLN: 650.0000 mg | ORAL | Status: DC | PRN

## 2016-11-14 MED ORDER — ONDANSETRON HCL 4 MG/2ML IJ SOLN: 4.0000 mg | Freq: Three times a day (TID) | INTRAMUSCULAR | Status: DC | PRN

## 2016-11-14 MED ORDER — LABETALOL HCL 5 MG/ML IV SOLN: 20.0000 mg | Freq: Once | INTRAVENOUS | Status: DC

## 2016-11-14 MED ORDER — STROKE: EARLY STAGES OF RECOVERY BOOK
Freq: Once | Status: AC
  Administered 2016-11-15: 01:00:00
  Filled 2016-11-14: qty 1

## 2016-11-14 MED ORDER — ACETAMINOPHEN 325 MG PO TABS: 650.0000 mg | ORAL_TABLET | Freq: Four times a day (QID) | ORAL | Status: DC | PRN

## 2016-11-14 MED ORDER — FUROSEMIDE 40 MG PO TABS
40.0000 mg | ORAL_TABLET | Freq: Every day | ORAL | Status: DC
  Administered 2016-11-15: 40 mg via ORAL
  Filled 2016-11-14: qty 1

## 2016-11-14 MED ORDER — VITAMIN B-12 1000 MCG PO TABS
1000.0000 ug | ORAL_TABLET | Freq: Every day | ORAL | Status: DC
  Administered 2016-11-15: 1000 ug via ORAL
  Filled 2016-11-14: qty 1

## 2016-11-14 MED ORDER — LABETALOL HCL 5 MG/ML IV SOLN: 10.0000 mg | Freq: Once | INTRAVENOUS | Status: DC

## 2016-11-14 MED ORDER — ALBUTEROL SULFATE (2.5 MG/3ML) 0.083% IN NEBU: 2.5000 mg | INHALATION_SOLUTION | RESPIRATORY_TRACT | Status: DC | PRN

## 2016-11-14 MED ORDER — RIVAROXABAN 15 MG PO TABS
15.0000 mg | ORAL_TABLET | Freq: Every day | ORAL | Status: DC
  Administered 2016-11-15: 15 mg via ORAL
  Filled 2016-11-14: qty 1

## 2016-11-14 MED ORDER — LOSARTAN POTASSIUM 50 MG PO TABS
100.0000 mg | ORAL_TABLET | Freq: Every day | ORAL | Status: DC
  Administered 2016-11-15: 100 mg via ORAL
  Filled 2016-11-14: qty 2

## 2016-11-14 MED ORDER — METOPROLOL SUCCINATE ER 50 MG PO TB24
50.0000 mg | ORAL_TABLET | Freq: Every evening | ORAL | Status: DC
  Administered 2016-11-14: 50 mg via ORAL
  Filled 2016-11-14: qty 1

## 2016-11-14 MED ORDER — ACETAMINOPHEN 325 MG PO TABS: 650.0000 mg | ORAL_TABLET | ORAL | Status: DC | PRN

## 2016-11-14 MED ORDER — VITAMIN D 1000 UNITS PO TABS
2000.0000 [IU] | ORAL_TABLET | Freq: Every day | ORAL | Status: DC
  Administered 2016-11-15: 2000 [IU] via ORAL
  Filled 2016-11-14: qty 2

## 2016-11-14 MED ORDER — LEVOTHYROXINE SODIUM 88 MCG PO TABS
88.0000 ug | ORAL_TABLET | Freq: Every day | ORAL | Status: DC
  Administered 2016-11-15: 88 ug via ORAL
  Filled 2016-11-14: qty 1

## 2016-11-14 MED ORDER — METOPROLOL SUCCINATE ER 25 MG PO TB24
25.0000 mg | ORAL_TABLET | Freq: Every evening | ORAL | Status: DC
  Administered 2016-11-15: 25 mg via ORAL
  Filled 2016-11-14: qty 1

## 2016-11-14 MED ORDER — HYDRALAZINE HCL 20 MG/ML IJ SOLN
10.0000 mg | Freq: Once | INTRAMUSCULAR | Status: AC
  Administered 2016-11-14: 10 mg via INTRAVENOUS
  Filled 2016-11-14: qty 1

## 2016-11-14 MED ORDER — BIOTIN 1 MG PO CAPS: ORAL_CAPSULE | ORAL | Status: DC

## 2016-11-14 NOTE — Telephone Encounter (Signed)
Pt is taking 100 mg of Losartan, not 50mg  , please advise.   .left message to have patient return my call. .left message to have bernadette return my call.

## 2016-11-14 NOTE — Telephone Encounter (Signed)
Sounds like only dizzy for 2 days.  BP may have increased due to something she's eaten.  Can increase losartan to 75mg  and come for bp checks twice a week in [place] [facility] for 2 weeks.  Goal <150/90.

## 2016-11-14 NOTE — ED Notes (Signed)
Attempted to call report

## 2016-11-14 NOTE — Telephone Encounter (Signed)
I'd imagine [facility] sent her to the ED.

## 2016-11-14 NOTE — Telephone Encounter (Signed)
Bernadette calling asking for advice with patient's blood pressure and dizziness. Per Mliss Sax pt bp is 160/80 and has been dizzy for 2 days. Per pt ( I could hear in the background) this has been going on for 3 weeks since starting losartan. Please advise    Mliss Sax wanting pt to go to UC, I told her to hold on til I speak with Dr. Mariea Clonts)

## 2016-11-14 NOTE — H&P (Signed)
History and Physical    Jody Blake VHQ:[id number] DOB: Dec 09, 1925 DOA: 11/14/2016  Referring MD/NP/PA:   PCP: Gayland Curry, DO   Patient coming from:  The patient is coming from home.  At baseline, pt is independent for most of ADL.   Chief Complaint: Ataxia  HPI: Jody Blake is a 83 y.o. female with medical history significant of hypertension, hyperlipidemia, COPD, asthma, hypothyroidism, DVT, PAF on Xarelto, dCHF, complete heart block, s/p of pacemaker placement, CKD-3, who presents with ataxia.  Patient states that she has been having poor balance in the past 3 days. She states that she cannot walk straight. Denies unilateral weakness, numbness or tingliness in extremities. No dizziness or lightheadedness. Denies facial droop, slurred speech, vision change or hearing loss. Patient does not have chest pain, cough, fever or chills. She has mild shortness of breath due to COPD, which is at the baseline. Patient no GI symptoms, denies symptoms of UTI. She states that her blood pressure medication of Valsartan was recently changed to losartan.   ED Course: pt was found to have WBC 6.8, INR 1.03, PTT 29, negative troponin, negative UDS, negative urinalysis, stable renal function, temperature normal, oxygen saturation 98% on room air, CT head is negative for acute intracranial abnormalities. Blood pressure elevated at 222/105. Patient is placed on telemetry bed for observation.  Review of Systems:   General: no fevers, chills, no body weight gain, has fatigue HEENT: no blurry vision, hearing changes or sore throat Respiratory: no dyspnea, coughing, wheezing CV: no chest pain, no palpitations GI: no nausea, vomiting, abdominal pain, diarrhea, constipation GU: no dysuria, burning on urination, increased urinary frequency, hematuria  Ext: no leg edema Neuro: no unilateral weakness, numbness, or tingling, no vision change or hearing loss. Has poor balance. Skin: no rash, no skin  tear. MSK: No muscle spasm, no deformity, no limitation of range of movement in spin Heme: No easy bruising.  Travel history: No recent long distant travel.  Allergy:  Allergies  Allergen Reactions  . Brimonidine Tartrate-Timolol Other (See Comments)    REACTION: systemic malaise amigen eye drop  . Penicillins Hives  . Breo Ellipta [Fluticasone Furoate-Vilanterol] Other (See Comments)    Ran blood pressure up    Past Medical History:  Diagnosis Date  . Anemia   . Aortic stenosis    a. Echo 09/06/12 EF 55-60%, no WMA, G2DD, Ao valve sclerosis w/ mod stenosis, LA mildly dilated, PA pressure 60mmHg  . Asthmatic bronchitis   . Complete heart block [facility])    s/p permanent pacemaker 06/27/1999 (Battery change 06/2007 and 2016).  s/p AV nodal ablation by Dr [name] 2016.  [name] COPD (chronic obstructive pulmonary disease) (HCC)    Dr. [name]  . DDD (degenerative disc disease)   . Diastolic dysfunction    a. Echo 09/06/12 EF 55-60%, no WMA, G2DD, Ao valve sclerosis w/ mod stenosis, LA mildly dilated, PA pressure 95mmHg  . Diverticulosis   . DVT (deep vein thrombosis) in pregnancy [facility]) 1954   a. LLE  . Hypertension   . Hypothyroidism    on medication  . Macular degeneration    Dr. [name]  . Osteoarthrosis, unspecified whether generalized or localized, other specified sites   . PAF (paroxysmal atrial fibrillation) (HCC)    Stopped flecainide, on amiodarone but still has bouts of A FIB (mostly in mornings).   . Pure hypercholesterolemia   . Staghorn calculus    Left    Past Surgical History:  Procedure Laterality Date  .  APPENDECTOMY  ~ 1941  . AV NODE ABLATION  05/10/2014  . AV NODE ABLATION N/A 05/10/2014   Procedure: AV NODE ABLATION;  Surgeon: Thompson Grayer, MD;  Location: [facility] CATH LAB;  Service: Cardiovascular;  Laterality: N/A;  . BUNIONECTOMY WITH HAMMERTOE RECONSTRUCTION Bilateral ~ 1990  . CARDIAC PACEMAKER PLACEMENT  06/27/99   Medtronic PM implanted by Dr [name]  . CARDIOVERSION N/A  12/02/2013   Procedure: CARDIOVERSION;  Surgeon: [name], MD;  Location: [facility] ENDOSCOPY;  Service: Cardiovascular;  Laterality: N/A;  . CATARACT EXTRACTION W/ INTRAOCULAR LENS  IMPLANT, BILATERAL Bilateral   . COLONOSCOPY WITH PROPOFOL N/A 04/22/2013   Procedure: COLONOSCOPY WITH PROPOFOL;  Surgeon: [name], MD;  Location: [facility] ENDOSCOPY;  Service: Endoscopy;  Laterality: N/A;  . DILATION AND CURETTAGE OF UTERUS  X 2   "when I was going thru menopause"  . ESOPHAGOGASTRODUODENOSCOPY (EGD) WITH PROPOFOL N/A 04/22/2013   Procedure: ESOPHAGOGASTRODUODENOSCOPY (EGD) WITH PROPOFOL;  Surgeon: [name], MD;  Location: [facility] ENDOSCOPY;  Service: Endoscopy;  Laterality: N/A;  . HERNIA REPAIR    . INCISIONAL HERNIA REPAIR    . INSERT / REPLACE / REMOVE PACEMAKER  06/2007   "took out the old; put in new"  . INSERT / REPLACE / REMOVE PACEMAKER  05/10/2014   MDT PPM generator change by Dr [name]  . JOINT REPLACEMENT    . PARTIAL NEPHRECTOMY Left 05/1974   stone disease  . PERMANENT PACEMAKER GENERATOR CHANGE N/A 05/10/2014   Procedure: PERMANENT PACEMAKER GENERATOR CHANGE;  Surgeon: Thompson Grayer, MD;  Location: [facility] CATH LAB;  Service: Cardiovascular;  Laterality: N/A;  . TONSILLECTOMY AND ADENOIDECTOMY  1930's  . TOTAL KNEE ARTHROPLASTY Right 2001    Social History:  reports that she has never smoked. She has never used smokeless tobacco. She reports that she drinks about 4.2 oz of alcohol per week . She reports that she does not use drugs.  Family History:  Family History  Problem Relation Age of Onset  . Malignant hypertension Father   . Hypertension Father   . Renal Disease Father   . Breast cancer Mother   . Heart attack Brother   . Stroke Brother      Prior to Admission medications   Medication Sig Start Date End Date Taking? Authorizing Provider  albuterol (PROVENTIL HFA;VENTOLIN HFA) 108 (90 Base) MCG/ACT inhaler Inhale 2 puffs into the lungs every 6 (six) hours as needed for  wheezing or shortness of breath. 02/01/16  Yes Young, Tarri Fuller D, MD  azelastine (ASTELIN) 0.1 % nasal spray Place 1-2 sprays into both nostrils 2 (two) times daily. Use in each nostril as directed 11/14/16  Yes Young, Tarri Fuller D, MD  Biotin 1 MG CAPS Take by mouth as directed.    Yes [provider]  Cholecalciferol (VITAMIN D) 2000 units CAPS Take 1 capsule by mouth daily.   Yes [provider]  Fluticasone-Salmeterol (ADVAIR) 250-50 MCG/DOSE AEPB Inhale 1 puff into the lungs 2 (two) times daily.   Yes [provider]  furosemide (LASIX) 40 MG tablet TAKE 1 TABLET BY MOUTH  DAILY 11/12/16  Yes Reed, Tiffany L, DO  levothyroxine (SYNTHROID, LEVOTHROID) 88 MCG tablet Take 1 tablet (88 mcg total) by mouth daily before breakfast. 05/08/16  Yes Reed, Tiffany L, DO  losartan (COZAAR) 100 MG tablet Take 1 tablet (100 mg total) by mouth daily. 11/11/16  Yes Reed, Tiffany L, DO  LUTEIN PO Take 25 mg by mouth daily.    Yes  [provider]  Magnesium 250 MG TABS Take 1 tablet by mouth daily.   Yes [provider]  metoprolol succinate (TOPROL-XL) 50 MG 24 hr tablet Take 1 tablet (50 mg total) by mouth every evening. Take with or immediately following a meal. 08/26/16  Yes Allred, Jeneen Rinks, MD  montelukast (SINGULAIR) 10 MG tablet Take 1 tablet (10 mg total) by mouth daily. 04/11/16  Yes Young, Tarri Fuller D, MD  Multiple Vitamins-Minerals (PRESERVISION AREDS 2) CAPS Take 1 capsule by mouth daily.   Yes [provider]  potassium chloride SA (K-DUR,KLOR-CON) 20 MEQ tablet Take 20 mEq by mouth daily.    Yes [provider]  vitamin B-12 (CYANOCOBALAMIN) 1000 MCG tablet Take 1,000 mcg by mouth daily.   Yes [provider]  XARELTO 15 MG TABS tablet TAKE 1 TABLET BY MOUTH  DAILY WITH SUPPER 09/25/16  Yes Jerline Pain, MD  benzocaine (ORAJEL) 10 % mucosal gel Use as directed 1 application in the mouth or throat as needed for mouth pain. For canker  sores Patient not taking: Reported on 11/14/2016 10/30/16   Gayland Curry, DO    Physical Exam: Vitals:   11/14/16 2047 11/14/16 2100 11/14/16 2215 11/14/16 2300  BP: (!) 220/119 (!) 222/105 (!) 188/90 (!) 209/98  Pulse:  63 61 66  Resp:  17 19 (!) 25  Temp:      TempSrc:      SpO2:  98% 100% 100%  Weight:      Height:       General: Not in acute distress HEENT:       Eyes: PERRL, EOMI, no scleral icterus.       ENT: No discharge from the ears and nose, no pharynx injection, no tonsillar enlargement.        Neck: No JVD, no bruit, no mass felt. Heme: No neck lymph node enlargement. Cardiac: S1/S2, RRR, No murmurs, No gallops or rubs. Respiratory:  No rales, wheezing, rhonchi or rubs. GI: Soft, nondistended, nontender, no rebound pain, no organomegaly, BS present. GU: No hematuria Ext: No pitting leg edema bilaterally. 2+DP/PT pulse bilaterally. Musculoskeletal: No joint deformities, No joint redness or warmth, no limitation of ROM in spin. Skin: No rashes.  Neuro: Alert, oriented X3, cranial nerves II-XII grossly intact, moves all extremities normally. Muscle strength 5/5 in all extremities, sensation to light touch intact. Brachial reflex 2+ bilaterally. Negative Babinski's sign. Normal finger to nose test. Has unsteady gait. Psych: Patient is not psychotic, no suicidal or hemocidal ideation.  Labs on Admission: I have personally reviewed following labs and imaging studies  CBC:  Recent Labs Lab 11/14/16 1636  WBC 6.8  NEUTROABS 4.4  HGB 14.4  HCT 43.5  MCV 96.0  PLT 094   Basic Metabolic Panel:  Recent Labs Lab 11/14/16 1636  NA 139  K 3.7  CL 103  CO2 27  GLUCOSE 92  BUN 24*  CREATININE 1.17*  CALCIUM 9.7   GFR: Estimated Creatinine Clearance: 28.7 mL/min (A) (by C-G formula based on SCr of 1.17 mg/dL (H)). Liver Function Tests:  Recent Labs Lab 11/14/16 1636  AST 29  ALT 16  ALKPHOS 66  BILITOT 0.7  PROT 7.2  ALBUMIN 4.2   No results for  input(s): LIPASE, AMYLASE in the last 168 hours. No results for input(s): AMMONIA in the last 168 hours. Coagulation Profile:  Recent Labs Lab 11/14/16 2119  INR 1.03   Cardiac Enzymes: No results for input(s): CKTOTAL, CKMB, CKMBINDEX, TROPONINI in  the last 168 hours. BNP (last 3 results) No results for input(s): PROBNP in the last 8760 hours. HbA1C: No results for input(s): HGBA1C in the last 72 hours. CBG: No results for input(s): GLUCAP in the last 168 hours. Lipid Profile: No results for input(s): CHOL, HDL, LDLCALC, TRIG, CHOLHDL, LDLDIRECT in the last 72 hours. Thyroid Function Tests: No results for input(s): TSH, T4TOTAL, FREET4, T3FREE, THYROIDAB in the last 72 hours. Anemia Panel: No results for input(s): VITAMINB12, FOLATE, FERRITIN, TIBC, IRON, RETICCTPCT in the last 72 hours. Urine analysis:    Component Value Date/Time   COLORURINE STRAW (A) 11/14/2016 2205   APPEARANCEUR CLEAR 11/14/2016 2205   LABSPEC 1.009 11/14/2016 2205   PHURINE 8.0 11/14/2016 2205   GLUCOSEU NEGATIVE 11/14/2016 2205   HGBUR NEGATIVE 11/14/2016 2205   BILIRUBINUR NEGATIVE 11/14/2016 2205   KETONESUR NEGATIVE 11/14/2016 2205   PROTEINUR 30 (A) 11/14/2016 2205   UROBILINOGEN 0.2 01/26/2015 0325   NITRITE NEGATIVE 11/14/2016 2205   LEUKOCYTESUR NEGATIVE 11/14/2016 2205   Sepsis Labs: @LABRCNTIP (procalcitonin:4,lacticidven:4) )No results found for this or any previous visit (from the past 240 hour(s)).   Radiological Exams on Admission: Ct Head Wo Contrast  Result Date: 11/14/2016 CLINICAL DATA:  Unsteady gait with dizziness. EXAM: CT HEAD WITHOUT CONTRAST TECHNIQUE: Contiguous axial images were obtained from the base of the skull through the vertex without intravenous contrast. COMPARISON:  01/25/2015 FINDINGS: Brain: Chronic moderate superficial atrophy with moderate degree of chronic small vessel ischemic disease of periventricular and subcortical white matter. No large vascular  territory infarct. No intracranial hemorrhage, midline shift or edema. No intra-axial mass nor extra-axial fluid collections. No hydrocephalus. Vascular: Moderate atherosclerosis of the carotid siphons. No hyperdense vessels. Skull: No skull fracture or suspicious osseous lesions. Sinuses/Orbits: No acute sinus disease. Partial opacification of the right inferior mastoid consistent with a small effusion. This appears chronic. Cataract extraction bilaterally. Intact orbits. Other: None IMPRESSION: 1. Atrophy with chronic moderate small vessel ischemic disease of the brain. 2. No acute intracranial abnormality. Electronically Signed   By: Ashley Royalty M.D.   On: 11/14/2016 21:52     EKG: Independently reviewed.  Paced rhythm, QTC 499  Assessment/Plan Principal Problem:   Ataxia Active Problems:   Essential hypertension   Permanent atrial fibrillation (HCC)   COPD mixed type ([place])   Long term (current) use of anticoagulants   Hypothyroidism   DVT (deep vein thrombosis) in pregnancy ([place])   CKD (chronic kidney disease), stage III   Chronic diastolic CHF (congestive heart failure) (HCC)   Hypertensive urgency   Ataxia: Etiology is not clear. Potential differential diagnosis is posterior circulation stroke.  Hypertensive urgency may have also contributed partially. CT head is negative for acute intracranial abnormalities. Due to pacemaker, patient cannot MRI. Pt has CKD-III, I hesitated to do CT angiogram of neck and head. Will follow up renal function in morning. If kidney function allows, may consider CTA of head and neck.  -will place on tele bed for Obs -carotid doppler and 2d echo -check A1c and FLP -pt/ot -will decrease metoprolol dose from 50-25 mg daily since pt may cause ataxia  Hypertensive urgency: Blood pressure 222/105 -will continue home losartan, metoprolol -Add amlodipine 5 mg daily -IV hydralazine when necessary - pt is also on lasix for CHF  Atrial Fibrillation:  CHA2DS2-VASc Score is 5, needs oral anticoagulation. Patient is on Xarelto at home. Heart rate is well controlled. -on Xarelto and metoprolol  COPD mixed type ([place]): stable -prn albuterol nebs -Dulera Inhaler -  Continue Singulair  Hypothyroidism: Last TSH was 1.05 on 04/30/16 -Continue home Synthroid  Hx of DVT: -on Xarelto  CKD (chronic kidney disease), stage III: Stable. Baseline creatinine 1.0-1.2. Her creatinine is 1.170 -Follow-up renal function by BMP  Chronic diastolic CHF (congestive heart failure) ([place]): 2-D echo on 09/06/12 showed EF of 55-60% with grade 2 diastolic dysfunction. Patient has trace leg edema, but no JVD. CHF is compensated on admission. -Continue home Lasix and metoprolol. -Check BNP    DVT ppx: on Xarelto Code Status: DNR (I discussed with patient in the presence of her good friend, and explained the meaning of CODE STATUS. Patient wants to be DNR) Family Communication: Yes, patient's friend  at bed side disposition Plan:  Anticipate discharge back to previous home environment Consults called:  none Admission status: Obs / tele    Date of Service 11/14/2016    Ivor Costa [facility] Pager [phone]  If 7PM-7AM, please contact night-coverage [email or web address] Password [email or web address] 11/14/2016, 11:46 PM

## 2016-11-14 NOTE — ED Provider Notes (Signed)
[facility] DEPT Provider Note   CSN: [id number] Arrival date & time: 11/14/16  1557     History   Chief Complaint Chief Complaint  Patient presents with  . Dizziness    HPI Jody Blake is a 83 y.o. female.  HPI Pt felt that her balance was off this am.  It has been ongoing for the last two to three days but worse today. She felt unsteady.   She did not fall to one side or the other but just felt her balance was off.  No trouble with her vision or her speech.  No trouble with numbness or weakness in her arms or legs.   Past Medical History:  Diagnosis Date  . Anemia   . Aortic stenosis    a. Echo 09/06/12 EF 55-60%, no WMA, G2DD, Ao valve sclerosis w/ mod stenosis, LA mildly dilated, PA pressure 73mmHg  . Asthmatic bronchitis   . Complete heart block [facility])    s/p permanent pacemaker 06/27/1999 (Battery change 06/2007 and 2016).  s/p AV nodal ablation by Dr [name] 2016.  [name] COPD (chronic obstructive pulmonary disease) (HCC)    Dr. [name]  . DDD (degenerative disc disease)   . Diastolic dysfunction    a. Echo 09/06/12 EF 55-60%, no WMA, G2DD, Ao valve sclerosis w/ mod stenosis, LA mildly dilated, PA pressure 22mmHg  . Diverticulosis   . DVT (deep vein thrombosis) in pregnancy [facility]) 1954   a. LLE  . Hypertension   . Hypothyroidism    on medication  . Macular degeneration    Dr. [name]  . Osteoarthrosis, unspecified whether generalized or localized, other specified sites   . PAF (paroxysmal atrial fibrillation) (HCC)    Stopped flecainide, on amiodarone but still has bouts of A FIB (mostly in mornings).   . Pure hypercholesterolemia   . Staghorn calculus    Left      Past Surgical History:  Procedure Laterality Date  . APPENDECTOMY  ~ 1941  . AV NODE ABLATION  05/10/2014  . AV NODE ABLATION N/A 05/10/2014   Procedure: AV NODE ABLATION;  Surgeon: Thompson Grayer, MD;  Location[facility] CATH LAB;  Service: Cardiovascular;  Laterality: N/A;  . BUNIONECTOMY WITH HAMMERTOE  RECONSTRUCTION Bilateral ~ 1990  . CARDIAC PACEMAKER PLACEMENT  06/27/99   Medtronic PM implanted by Dr [name]  . CARDIOVERSION N/A 12/02/2013   Procedure: CARDIOVERSION;  Surgeon: [name], MD;  Location: [facility] ENDOSCOPY;  Service: Cardiovascular;  Laterality: N/A;  . CATARACT EXTRACTION W/ INTRAOCULAR LENS  IMPLANT, BILATERAL Bilateral   . COLONOSCOPY WITH PROPOFOL N/A 04/22/2013   Procedure: COLONOSCOPY WITH PROPOFOL;  Surgeon: [name], MD;  Location: [facility] ENDOSCOPY;  Service: Endoscopy;  Laterality: N/A;  . DILATION AND CURETTAGE OF UTERUS  X 2   "when I was going thru menopause"  . ESOPHAGOGASTRODUODENOSCOPY (EGD) WITH PROPOFOL N/A 04/22/2013   Procedure: ESOPHAGOGASTRODUODENOSCOPY (EGD) WITH PROPOFOL;  Surgeon: [name], MD;  Location: [facility] ENDOSCOPY;  Service: Endoscopy;  Laterality: N/A;  . HERNIA REPAIR    . INCISIONAL HERNIA REPAIR    . INSERT / REPLACE / REMOVE PACEMAKER  06/2007   "took out the old; put in new"  . INSERT / REPLACE / REMOVE PACEMAKER  05/10/2014   MDT PPM generator change by Dr [name]  . JOINT REPLACEMENT    . PARTIAL NEPHRECTOMY Left 05/1974   stone disease  . PERMANENT PACEMAKER GENERATOR CHANGE N/A 05/10/2014   Procedure: PERMANENT PACEMAKER GENERATOR CHANGE;  Surgeon: Jeneen Rinks  Allred, MD;  Location: [facility] CATH LAB;  Service: Cardiovascular;  Laterality: N/A;  . TONSILLECTOMY AND ADENOIDECTOMY  1930's  . TOTAL KNEE ARTHROPLASTY Right 2001    OB History    Gravida Para Term Preterm AB Living   2 2       2    SAB TAB Ectopic Multiple Live Births                   Home Medications    Prior to Admission medications   Medication Sig Start Date End Date Taking? Authorizing Provider  albuterol (PROVENTIL HFA;VENTOLIN HFA) 108 (90 Base) MCG/ACT inhaler Inhale 2 puffs into the lungs every 6 (six) hours as needed for wheezing or shortness of breath. 02/01/16  Yes Young, Tarri Fuller D, MD  azelastine (ASTELIN) 0.1 % nasal spray Place 1-2 sprays into both  nostrils 2 (two) times daily. Use in each nostril as directed 11/14/16  Yes Young, Tarri Fuller D, MD  Biotin 1 MG CAPS Take by mouth as directed.    Yes [provider]  Cholecalciferol (VITAMIN D) 2000 units CAPS Take 1 capsule by mouth daily.   Yes [provider]  Fluticasone-Salmeterol (ADVAIR) 250-50 MCG/DOSE AEPB Inhale 1 puff into the lungs 2 (two) times daily.   Yes [provider]  furosemide (LASIX) 40 MG tablet TAKE 1 TABLET BY MOUTH  DAILY 11/12/16  Yes Reed, Tiffany L, DO  levothyroxine (SYNTHROID, LEVOTHROID) 88 MCG tablet Take 1 tablet (88 mcg total) by mouth daily before breakfast. 05/08/16  Yes Reed, Tiffany L, DO  losartan (COZAAR) 100 MG tablet Take 1 tablet (100 mg total) by mouth daily. 11/11/16  Yes Reed, Tiffany L, DO  LUTEIN PO Take 25 mg by mouth daily.    Yes [provider]  Magnesium 250 MG TABS Take 1 tablet by mouth daily.   Yes [provider]  metoprolol succinate (TOPROL-XL) 50 MG 24 hr tablet Take 1 tablet (50 mg total) by mouth every evening. Take with or immediately following a meal. 08/26/16  Yes Allred, Jeneen Rinks, MD  montelukast (SINGULAIR) 10 MG tablet Take 1 tablet (10 mg total) by mouth daily. 04/11/16  Yes Young, Tarri Fuller D, MD  Multiple Vitamins-Minerals (PRESERVISION AREDS 2) CAPS Take 1 capsule by mouth daily.   Yes [provider]  potassium chloride SA (K-DUR,KLOR-CON) 20 MEQ tablet Take 20 mEq by mouth daily.    Yes [provider]  vitamin B-12 (CYANOCOBALAMIN) 1000 MCG tablet Take 1,000 mcg by mouth daily.   Yes [provider]  XARELTO 15 MG TABS tablet TAKE 1 TABLET BY MOUTH  DAILY WITH SUPPER 09/25/16  Yes Jerline Pain, MD  benzocaine (ORAJEL) 10 % mucosal gel Use as directed 1 application in the mouth or throat as needed for mouth pain. For canker sores Patient not taking: Reported on 11/14/2016 10/30/16   Gayland Curry, DO    Family History Family History  Problem Relation Age of Onset    . Malignant hypertension Father   . Hypertension Father   . Renal Disease Father   . Breast cancer Mother   . Heart attack Brother   . Stroke Brother     Social History Social History  Substance Use Topics  . Smoking status: Never Smoker  . Smokeless tobacco: Never Used  . Alcohol use 4.2 oz/week    7 Glasses of wine per week     Allergies   Brimonidine tartrate-timolol; Penicillins; and Breo ellipta [fluticasone furoate-vilanterol]   Review  of Systems Review of Systems  All other systems reviewed and are negative.    Physical Exam Updated Vital Signs BP (!) 188/90   Pulse 61   Temp 98.1 F (36.7 C) (Oral)   Resp 19   Ht 1.626 m (5\' 4" )   Wt 61.7 kg (136 lb)   LMP 04/08/1974   SpO2 100%   BMI 23.34 kg/m   Physical Exam  Constitutional: She is oriented to person, place, and time. She appears well-developed and well-nourished. No distress.  HENT:  Head: Normocephalic and atraumatic.  Right Ear: External ear normal.  Left Ear: External ear normal.  Mouth/Throat: Oropharynx is clear and moist.  Eyes: Conjunctivae are normal. Right eye exhibits no discharge. Left eye exhibits no discharge. No scleral icterus.  Neck: Neck supple. No tracheal deviation present.  Cardiovascular: Normal rate, regular rhythm and intact distal pulses.   Pulmonary/Chest: Effort normal and breath sounds normal. No stridor. No respiratory distress. She has no wheezes. She has no rales.  Abdominal: Soft. Bowel sounds are normal. She exhibits no distension. There is no tenderness. There is no rebound and no guarding.  Musculoskeletal: She exhibits no edema or tenderness.  Neurological: She is alert and oriented to person, place, and time. She has normal strength. No cranial nerve deficit (No facial droop, extraocular movements intact, tongue midline ) or sensory deficit. She exhibits normal muscle tone. She displays no seizure activity. Gait abnormal. Coordination normal.  No pronator drift  bilateral upper extrem, able to hold both legs off bed for 5 seconds, sensation intact in all extremities, no visual field cuts, no left or right sided neglect, normal finger-nose exam bilaterally, no nystagmus noted; pt is unsteady on her feet, starts to fall when trying to walk  Skin: Skin is warm and dry. No rash noted.  Psychiatric: She has a normal mood and affect.  Nursing note and vitals reviewed.    ED Treatments / Results  Labs (all labs ordered are listed, but only abnormal results are displayed) Labs Reviewed  COMPREHENSIVE METABOLIC PANEL - Abnormal; Notable for the following:       Result Value   BUN 24 (*)    Creatinine, Ser 1.17 (*)    GFR calc non Af Amer 40 (*)    GFR calc Af Amer 47 (*)    All other components within normal limits  URINALYSIS, ROUTINE W REFLEX MICROSCOPIC - Abnormal; Notable for the following:    Color, Urine STRAW (*)    Protein, ur 30 (*)    Squamous Epithelial / LPF 0-5 (*)    All other components within normal limits  CBC WITH DIFFERENTIAL/PLATELET  ETHANOL  PROTIME-INR  APTT  RAPID URINE DRUG SCREEN, HOSP PERFORMED  I-STAT TROPONIN, ED    EKG  EKG Interpretation  Date/Time:  Thursday November 14 2016 20:58:28 EDT Ventricular Rate:  66 PR Interval:    QRS Duration: 172 QT Interval:  476 QTC Calculation: 499 R Axis:   -80 Text Interpretation:  VENTRICULAR PACED RHYTHM No significant change since last tracing Confirmed by Dorie Rank [phone]) on 11/14/2016 9:08:34 PM       Radiology Ct Head Wo Contrast  Result Date: 11/14/2016 CLINICAL DATA:  Unsteady gait with dizziness. EXAM: CT HEAD WITHOUT CONTRAST TECHNIQUE: Contiguous axial images were obtained from the base of the skull through the vertex without intravenous contrast. COMPARISON:  01/25/2015 FINDINGS: Brain: Chronic moderate superficial atrophy with moderate degree of chronic small vessel ischemic disease of periventricular and subcortical white  matter. No large vascular territory  infarct. No intracranial hemorrhage, midline shift or edema. No intra-axial mass nor extra-axial fluid collections. No hydrocephalus. Vascular: Moderate atherosclerosis of the carotid siphons. No hyperdense vessels. Skull: No skull fracture or suspicious osseous lesions. Sinuses/Orbits: No acute sinus disease. Partial opacification of the right inferior mastoid consistent with a small effusion. This appears chronic. Cataract extraction bilaterally. Intact orbits. Other: None IMPRESSION: 1. Atrophy with chronic moderate small vessel ischemic disease of the brain. 2. No acute intracranial abnormality. Electronically Signed   By: Ashley Royalty M.D.   On: 11/14/2016 21:52    Procedures Procedures (including critical care time)  Medications Ordered in ED Medications  metoprolol succinate (TOPROL-XL) 24 hr tablet 50 mg (50 mg Oral Given 11/14/16 2245)  losartan (COZAAR) tablet 100 mg (100 mg Oral Given 11/14/16 2245)     Initial Impression / Assessment and Plan / ED Course  I have reviewed the triage vital signs and the nursing notes.  Pertinent labs & imaging results that were available during my care of the patient were reviewed by me and considered in my medical decision making (see chart for details).  Clinical Course as of Nov 15 2254  Thu Nov 14, 2016  2238 Most recent bp 180/90  [JK]    Clinical Course User Index [JK] Dorie Rank, MD   Patient presented to the emergency room with complaints of ataxia. She does have history of hypertension or blood pressures recently been elevated. On examination emergency room the patient did have difficulty with her balance and she was falling. Laboratory tests and CT scan are reassuring.  Patient would likely benefit from an MRI to evaluate for occult ischemia however she has a pacemaker. I will consult the medical service for admission and further evaluation  Final Clinical Impressions(s) / ED Diagnoses   Final diagnoses:  Ataxia    New  Prescriptions New Prescriptions   No medications on file     Dorie Rank, MD 11/14/16 2257

## 2016-11-14 NOTE — Telephone Encounter (Signed)
Pt in ED now.

## 2016-11-14 NOTE — ED Triage Notes (Signed)
Pt to ED with c/o feeling off balance   Pt st's she is not dizzy she just feels like she can't walk straight.,  Pt's blood pressure meds were recently changed

## 2016-11-14 NOTE — ED Notes (Signed)
Patient transported to CT 

## 2016-11-15 ENCOUNTER — Observation Stay (HOSPITAL_COMMUNITY): Payer: Medicare Other

## 2016-11-15 ENCOUNTER — Other Ambulatory Visit (HOSPITAL_COMMUNITY): Payer: Medicare Other

## 2016-11-15 ENCOUNTER — Observation Stay (HOSPITAL_BASED_OUTPATIENT_CLINIC_OR_DEPARTMENT_OTHER): Payer: Medicare Other

## 2016-11-15 DIAGNOSIS — J449 Chronic obstructive pulmonary disease, unspecified: Secondary | ICD-10-CM | POA: Diagnosis not present

## 2016-11-15 DIAGNOSIS — I10 Essential (primary) hypertension: Secondary | ICD-10-CM

## 2016-11-15 DIAGNOSIS — R270 Ataxia, unspecified: Secondary | ICD-10-CM | POA: Diagnosis not present

## 2016-11-15 DIAGNOSIS — I5032 Chronic diastolic (congestive) heart failure: Secondary | ICD-10-CM | POA: Diagnosis not present

## 2016-11-15 DIAGNOSIS — N183 Chronic kidney disease, stage 3 (moderate): Secondary | ICD-10-CM | POA: Diagnosis not present

## 2016-11-15 DIAGNOSIS — I160 Hypertensive urgency: Secondary | ICD-10-CM

## 2016-11-15 DIAGNOSIS — E039 Hypothyroidism, unspecified: Secondary | ICD-10-CM

## 2016-11-15 LAB — VAS US CAROTID
LCCAPSYS: 62 cm/s
LEFT ECA DIAS: -18 cm/s
LEFT VERTEBRAL DIAS: 15 cm/s
LICADSYS: -46 cm/s
LICAPSYS: 95 cm/s
Left CCA dist dias: -10 cm/s
Left CCA dist sys: -66 cm/s
Left CCA prox dias: 13 cm/s
Left ICA dist dias: -14 cm/s
Left ICA prox dias: 23 cm/s
RCCADSYS: -48 cm/s
RIGHT ECA DIAS: -6 cm/s
Right CCA prox dias: 10 cm/s
Right CCA prox sys: 46 cm/s

## 2016-11-15 LAB — BRAIN NATRIURETIC PEPTIDE: B NATRIURETIC PEPTIDE 5: 255.1 pg/mL — AB (ref 0.0–100.0)

## 2016-11-15 LAB — BASIC METABOLIC PANEL
Anion gap: 8 (ref 5–15)
BUN: 14 mg/dL (ref 6–20)
CO2: 29 mmol/L (ref 22–32)
CREATININE: 0.77 mg/dL (ref 0.44–1.00)
Calcium: 9.3 mg/dL (ref 8.9–10.3)
Chloride: 104 mmol/L (ref 101–111)
GFR calc non Af Amer: >60 mL/min (ref >60)
Glucose, Bld: 103 mg/dL — ABNORMAL HIGH (ref 65–99)
Potassium: 3.4 mmol/L — ABNORMAL LOW (ref 3.5–5.1)
SODIUM: 141 mmol/L (ref 135–145)

## 2016-11-15 LAB — LIPID PANEL
CHOL/HDL RATIO: 3.2 ratio
CHOLESTEROL: 205 mg/dL — AB (ref 0–200)
HDL: 64 mg/dL (ref >40)
LDL Cholesterol: 126 mg/dL — ABNORMAL HIGH (ref 0–99)
Triglycerides: 74 mg/dL (ref <150)
VLDL: 15 mg/dL (ref 0–40)

## 2016-11-15 LAB — HEMOGLOBIN A1C
Hgb A1c MFr Bld: 5.9 % — ABNORMAL HIGH (ref 4.8–5.6)
Mean Plasma Glucose: 122.63 mg/dL

## 2016-11-15 MED ORDER — AMLODIPINE BESYLATE 5 MG PO TABS: 5.0000 mg | ORAL_TABLET | Freq: Every day | ORAL | 0 refills | Status: DC

## 2016-11-15 NOTE — Progress Notes (Signed)
*  PRELIMINARY RESULTS* Vascular Ultrasound Carotid Duplex (Doppler) has been completed.  Preliminary findings: Bilateral 1-39% ICA stenosis, antegrade vertebral flow.   Everrett Coombe 11/15/2016, 3:22 PM

## 2016-11-15 NOTE — Discharge Instructions (Addendum)
You were admitted for unsteadiness. This may be secondary to your blood pressure. You worked with physical therapy and they recommended continued physical therapy. Your neck scan does not show significant blockages. Please follow up with your primary care physician.   Information on my medicine - XARELTO (Rivaroxaban)  Why was Xarelto prescribed for you? Xarelto was prescribed for you to reduce the risk of a blood clot forming that can cause a stroke if you have a medical condition called atrial fibrillation (a type of irregular heartbeat).  What do you need to know about xarelto ? Take your Xarelto ONCE DAILY at the same time every day with your evening meal. If you have difficulty swallowing the tablet whole, you may crush it and mix in applesauce just prior to taking your dose.  Take Xarelto exactly as prescribed by your doctor and DO NOT stop taking Xarelto without talking to the doctor who prescribed the medication.  Stopping without other stroke prevention medication to take the place of Xarelto may increase your risk of developing a clot that causes a stroke.  Refill your prescription before you run out.  After discharge, you should have regular check-up appointments with your healthcare provider that is prescribing your Xarelto.  In the future your dose may need to be changed if your kidney function or weight changes by a significant amount.  What do you do if you miss a dose? If you are taking Xarelto ONCE DAILY and you miss a dose, take it as soon as you remember on the same day then continue your regularly scheduled once daily regimen the next day. Do not take two doses of Xarelto at the same time or on the same day.   Important Safety Information A possible side effect of Xarelto is bleeding. You should call your healthcare provider right away if you experience any of the following: ? Bleeding from an injury or your nose that does not stop. ? Unusual colored urine (red  or dark brown) or unusual colored stools (red or black). ? Unusual bruising for unknown reasons. ? A serious fall or if you hit your head (even if there is no bleeding).  Some medicines may interact with Xarelto and might increase your risk of bleeding while on Xarelto. To help avoid this, consult your healthcare provider or pharmacist prior to using any new prescription or non-prescription medications, including herbals, vitamins, non-steroidal anti-inflammatory drugs (NSAIDs) and supplements.  This website has more information on Xarelto: [email or web address]

## 2016-11-15 NOTE — Care Management Obs Status (Signed)
[facility] NOTIFICATION   Patient Details  Name: TAMY ACCARDO MRN: [id number] Date of Birth: 1926/03/24   Medicare Observation Status Notification Given:  Yes    Pollie Friar, RN 11/15/2016, 2:37 PM

## 2016-11-15 NOTE — Care Management Note (Signed)
Case Management Note  Patient Details  Name: Jody Blake MRN: [id number] Date of Birth: 1925/05/05  Subjective/Objective:                    Action/Plan: Pt discharging back to independent apartment in [place]. MD placed orders for [facility] PT. CM met with the patient and she would like to use the therapy service through [place]. CM called [facility] and faxed them the orders for [facility] services. Daughter to provide transportation home.  Expected Discharge Date:                  Expected Discharge Plan:  [place]  In-House Referral:     Discharge planning Services  CM Consult  Post Acute Care Choice:    Choice offered to:  Patient, Adult Children  DME Arranged:    DME Agency:     HH Arranged:  PT [place]:   Environmental education officer)  Status of Service:  Completed, signed off  If discussed at [place] of Stay Meetings, dates discussed:    Additional Comments:  Pollie Friar, RN 11/15/2016, 2:56 PM

## 2016-11-15 NOTE — Care Management Note (Signed)
Case Management Note  Patient Details  Name: Jody Blake MRN: [id number] Date of Birth: 21-Feb-1926  Subjective/Objective:    Pt in with ataxia. She is from home.                 Action/Plan: PT recommending [facility] services. Awaiting OT recs. CM following for d/c needs, physician orders.   Expected Discharge Date:                  Expected Discharge Plan:  [place]  In-House Referral:     Discharge planning Services  CM Consult  Post Acute Care Choice:    Choice offered to:     DME Arranged:    DME Agency:     HH Arranged:    [place] Agency:     Status of Service:  In process, will continue to follow  If discussed at Long Length of Stay Meetings, dates discussed:    Additional Comments:  Pollie Friar, RN 11/15/2016, 1:06 PM

## 2016-11-15 NOTE — Progress Notes (Signed)
OT Cancellation Note  Patient Details Name: Jody Blake MRN: [id number] DOB: [birth year]   Cancelled Treatment:    Reason Eval/Treat Not Completed: Patient at procedure or test/ unavailable (echo)  [place], OT/L  [phone] 11/15/2016 11/15/2016, 2:06 PM

## 2016-11-15 NOTE — Progress Notes (Signed)
Pt being discharged per orders from MD. Pt and family educated on discharge instructions. Pt and family verbalized understanding of instructions. All questions and concerns were addressed. Pt's IV was removed prior to discharge. Pt exited hospital via wheelchair. 

## 2016-11-15 NOTE — Evaluation (Signed)
Physical Therapy Evaluation Patient Details Name: Jody Blake MRN: [id number] DOB: 03-23-1926 Today's Date: 11/15/2016   History of Present Illness  Pt is an 83 y/o female admitted secondary to three day history of ataxia and poor balance. Pt found to be in hypertensive urgency. CT of the head was negative, due to pacemaker an MRI cannot be performed. PMH including but not limited to HTN, COPD, HLD, CKD, PAF, complete heart block s/p pacemaker placement in 2001.  Clinical Impression  Pt presented sitting OOB in recliner chair, awake and willing to participate in therapy session. Pt's friend present throughout session. Prior to admission, pt reported that she was independent with all functional mobility and ADLs. Pt ambulated in hallway without AD, with min guard for safety. Pt had LOB x2 requiring min A to recover. Pt would continue to benefit from skilled physical therapy services at this time while admitted and after d/c to address the below listed limitations in order to improve overall safety and independence with functional mobility.     Follow Up Recommendations Home health PT    Equipment Recommendations  None recommended by PT;Other (comment) (pt has RW at home; PT recommended that she use it)    Recommendations for Other Services       Precautions / Restrictions Precautions Precautions: Fall Restrictions Weight Bearing Restrictions: No      Mobility  Bed Mobility               General bed mobility comments: pt OOB in recliner chair upon arrival  Transfers Overall transfer level: Needs assistance Equipment used: None;Rolling walker (2 wheeled) Transfers: Sit to/from Stand Sit to Stand: Supervision         General transfer comment: increased time, supervision for safety  Ambulation/Gait Ambulation/Gait assistance: Min guard;Min assist Ambulation Distance (Feet): 75 Feet Assistive device: None Gait Pattern/deviations: Step-through pattern;Decreased step  length - right;Decreased step length - left;Decreased stride length;Narrow base of support;Drifts right/left Gait velocity: decreased Gait velocity interpretation: <1.8 ft/sec, indicative of risk for recurrent falls General Gait Details: modest instability with LOB x3 laterally that requiring min A to maintain upright position.   Stairs            Wheelchair Mobility    Modified Rankin (Stroke Patients Only)       Balance Overall balance assessment: Needs assistance Sitting-balance support: Feet supported Sitting balance-Leahy Scale: Good     Standing balance support: During functional activity;No upper extremity supported Standing balance-Leahy Scale: Fair Standing balance comment: static stance is fair with min guard, dynamic is poor with min A                             Pertinent Vitals/Pain Pain Assessment: No/denies pain    Home Living Family/patient expects to be discharged to:: Other (Comment)                 Additional Comments: Pt lives at [place] at [facility]; pt's husband also lives there in the skilled nursing     Prior Function Level of Independence: Independent               Hand Dominance        Extremity/Trunk Assessment   Upper Extremity Assessment Upper Extremity Assessment: Overall WFL for tasks assessed    Lower Extremity Assessment Lower Extremity Assessment: Overall WFL for tasks assessed       Communication   Communication: No difficulties  Cognition Arousal/Alertness: Awake/alert Behavior During Therapy: WFL for tasks assessed/performed Overall Cognitive Status: Within Functional Limits for tasks assessed                                        General Comments      Exercises     Assessment/Plan    PT Assessment Patient needs continued PT services  PT Problem List Decreased balance;Decreased mobility;Decreased coordination;Decreased knowledge of use of DME;Decreased  safety awareness       PT Treatment Interventions DME instruction;Gait training;Functional mobility training;Stair training;Therapeutic activities;Therapeutic exercise;Balance training;Neuromuscular re-education;Patient/family education    PT Goals (Current goals can be found in the Care Plan section)  Acute Rehab PT Goals Patient Stated Goal: return to her home at [facility]  PT Goal Formulation: With patient Time For Goal Achievement: 11/29/16 Potential to Achieve Goals: Good    Frequency Min 3X/week   Barriers to discharge        Co-evaluation               AM-PAC PT "6 Clicks" Daily Activity  Outcome Measure Difficulty turning over in bed (including adjusting bedclothes, sheets and blankets)?: None Difficulty moving from lying on back to sitting on the side of the bed? : None Difficulty sitting down on and standing up from a chair with arms (e.g., wheelchair, bedside commode, etc,.)?: A Little Help needed moving to and from a bed to chair (including a wheelchair)?: A Little Help needed walking in hospital room?: A Little Help needed climbing 3-5 steps with a railing? : A Little 6 Click Score: 20    End of Session Equipment Utilized During Treatment: Gait belt Activity Tolerance: Patient tolerated treatment well Patient left: in chair;with call bell/phone within reach;with chair alarm set;with family/visitor present Nurse Communication: Mobility status PT Visit Diagnosis: Other abnormalities of gait and mobility (R26.89)    Time: 4975-3005 PT Time Calculation (min) (ACUTE ONLY): 18 min   Charges:   PT Evaluation $PT Eval Moderate Complexity: 1 Mod     PT G Codes:   PT G-Codes **NOT FOR INPATIENT CLASS** Functional Assessment Tool Used: Clinical judgement;AM-PAC 6 Clicks Basic Mobility Functional Limitation: Mobility: Walking and moving around Mobility: Walking and Moving Around Current Status (R1021): At least 20 percent but less than 40 percent impaired,  limited or restricted Mobility: Walking and Moving Around Goal Status [phone]): 0 percent impaired, limited or restricted    [facility], PT, DPT [place] 11/15/2016, 1:35 PM

## 2016-11-15 NOTE — Discharge Summary (Signed)
Physician Discharge Summary  Jody Blake YKD:[id number] DOB: [birth year] DOA: 11/14/2016  PCP: Gayland Curry, DO  Admit date: 11/14/2016 Discharge date: 11/15/2016  Admitted From: ILF Disposition: ILF  Recommendations for Outpatient Follow-up:  1. Follow up with PCP in 1 week 2. Continued blood pressure management 3. Please follow up on the following pending results: None  Home Health: Physical therapy Equipment/Devices: None  Discharge Condition: Stable CODE STATUS: DNR Diet recommendation: Heart healthy   Brief/Interim Summary:  Admission HPI written by Ivor Costa, MD   Chief Complaint: Ataxia  HPI: Jody Blake is a 83 y.o. Blake with medical history significant of hypertension, hyperlipidemia, COPD, asthma, hypothyroidism, DVT, PAF on Xarelto, dCHF, complete heart block, s/p of pacemaker placement, CKD-3, who presents with ataxia.  Patient states that she has been having poor balance in the past 3 days. She states that she cannot walk straight. Denies unilateral weakness, numbness or tingliness in extremities. No dizziness or lightheadedness. Denies facial droop, slurred speech, vision change or hearing loss. Patient does not have chest pain, cough, fever or chills. She has mild shortness of breath due to COPD, which is at the baseline. Patient no GI symptoms, denies symptoms of UTI. She states that her blood pressure medication of Valsartan was recently changed to losartan.   ED Course: pt was found to have WBC 6.8, INR 1.03, PTT 29, negative troponin, negative UDS, negative urinalysis, stable renal function, temperature normal, oxygen saturation 98% on room air, CT head is negative for acute intracranial abnormalities. Blood pressure elevated at 222/105. Patient is placed on telemetry bed for observation.    Hospital course:  Ataxia Initial concern for posterior circulation stroke. CTA unremarkable and MRI was also unremarkable for stroke. Patient obtained a carotid  Dopplers which were significant for 1-39% stenosis in addition to antegrade vertebral artery flow. Patient worked with physical therapy who recommended outpatient physical therapy. Patient follow-up with primary care physician for continued workup and may need neurology follow-up as well.  Hypertensive urgency Possibly contributing to above problem. Patient with significant elevated blood pressure to 220s over 100s. Patient was continued on her home medications in addition to adding amlodipine. Her blood pressures were better controlled on this regimen. Recommend continue titration of medications to achieve local blood pressures over time to minimize rebound hypotension which would increase her risk of falls.  Atrial fibrillation Continued Xarelto and metoprolol  COPD Stable without exacerbation. Continue the albuterol nebs in addition to [facility] and Singulair.  Hypothyroidism Continued Synthroid  History of DVT Patient is on Xarelto for atrial fibrillation. This was continued as above.  CKD stage III Stable at baseline.  Chronic diastolic heart failure Previous echo in 2014 [place] significant for EF of 55-60% with grade 2 diastolic dysfunction. Stable and euvolemic.   Discharge Diagnoses:  Principal Problem:   Ataxia Active Problems:   Essential hypertension   Permanent atrial fibrillation (HCC)   COPD mixed type ([place])   Long term (current) use of anticoagulants   Hypothyroidism   CKD (chronic kidney disease), stage III   Chronic diastolic CHF (congestive heart failure) (HCC)   Hypertensive urgency    Discharge Instructions  Discharge Instructions    Call MD for:  difficulty breathing, headache or visual disturbances    Complete by:  As directed    Call MD for:  persistant dizziness or light-headedness    Complete by:  As directed    Call MD for:  persistant nausea and vomiting    Complete by:  As directed    Diet - low sodium heart healthy    Complete by:  As directed     Increase activity slowly    Complete by:  As directed      Allergies as of 11/15/2016      Reactions   Brimonidine Tartrate-timolol Other (See Comments)   REACTION: systemic malaise amigen eye drop   Penicillins Hives   Breo Ellipta [fluticasone Furoate-vilanterol] Other (See Comments)   Ran blood pressure up      Medication List    STOP taking these medications   benzocaine 10 % mucosal gel Commonly known as:  ORAJEL     TAKE these medications   albuterol 108 (90 Base) MCG/ACT inhaler Commonly known as:  PROVENTIL HFA;VENTOLIN HFA Inhale 2 puffs into the lungs every 6 (six) hours as needed for wheezing or shortness of breath.   amLODipine 5 MG tablet Commonly known as:  NORVASC Take 1 tablet (5 mg total) by mouth daily.   azelastine 0.1 % nasal spray Commonly known as:  ASTELIN Place 1-2 sprays into both nostrils 2 (two) times daily. Use in each nostril as directed   Biotin 1 MG Caps Take by mouth as directed.   Fluticasone-Salmeterol 250-50 MCG/DOSE Aepb Commonly known as:  ADVAIR Inhale 1 puff into the lungs 2 (two) times daily.   furosemide 40 MG tablet Commonly known as:  LASIX TAKE 1 TABLET BY MOUTH  DAILY   levothyroxine 88 MCG tablet Commonly known as:  SYNTHROID, LEVOTHROID Take 1 tablet (88 mcg total) by mouth daily before breakfast.   losartan 100 MG tablet Commonly known as:  COZAAR Take 1 tablet (100 mg total) by mouth daily.   LUTEIN PO Take 25 mg by mouth daily.   Magnesium 250 MG Tabs Take 1 tablet by mouth daily.   metoprolol succinate 50 MG 24 hr tablet Commonly known as:  TOPROL-XL Take 1 tablet (50 mg total) by mouth every evening. Take with or immediately following a meal.   montelukast 10 MG tablet Commonly known as:  SINGULAIR Take 1 tablet (10 mg total) by mouth daily.   potassium chloride SA 20 MEQ tablet Commonly known as:  K-DUR,KLOR-CON Take 20 mEq by mouth daily.   PRESERVISION AREDS 2 Caps Take 1 capsule by mouth  daily.   vitamin B-12 1000 MCG tablet Commonly known as:  CYANOCOBALAMIN Take 1,000 mcg by mouth daily.   Vitamin D 2000 units Caps Take 1 capsule by mouth daily.   XARELTO 15 MG Tabs tablet Generic drug:  Rivaroxaban TAKE 1 TABLET BY MOUTH  DAILY WITH SUPPER       Allergies  Allergen Reactions  . Brimonidine Tartrate-Timolol Other (See Comments)    REACTION: systemic malaise amigen eye drop  . Penicillins Hives  . Breo Ellipta [Fluticasone Furoate-Vilanterol] Other (See Comments)    Ran blood pressure up    Consultations:  None   Procedures/Studies: Ct Head Wo Contrast  Result Date: 11/14/2016 CLINICAL DATA:  Unsteady gait with dizziness. EXAM: CT HEAD WITHOUT CONTRAST TECHNIQUE: Contiguous axial images were obtained from the base of the skull through the vertex without intravenous contrast. COMPARISON:  01/25/2015 FINDINGS: Brain: Chronic moderate superficial atrophy with moderate degree of chronic small vessel ischemic disease of periventricular and subcortical white matter. No large vascular territory infarct. No intracranial hemorrhage, midline shift or edema. No intra-axial mass nor extra-axial fluid collections. No hydrocephalus. Vascular: Moderate atherosclerosis of the carotid siphons. No hyperdense vessels. Skull: No skull fracture or suspicious  osseous lesions. Sinuses/Orbits: No acute sinus disease. Partial opacification of the right inferior mastoid consistent with a small effusion. This appears chronic. Cataract extraction bilaterally. Intact orbits. Other: None IMPRESSION: 1. Atrophy with chronic moderate small vessel ischemic disease of the brain. 2. No acute intracranial abnormality. Electronically Signed   By: Ashley Royalty M.D.   On: 11/14/2016 21:52      Subjective: Patient without concerns today. No dizziness or lightheadedness. Walk well with physical therapy  Discharge Exam: Vitals:   11/15/16 0914 11/15/16 1448  BP: (!) 165/73 (!) 128/58  Pulse: 61 63   Resp: 20 20  Temp: 98.4 F (36.9 C) 97.6 F (36.4 C)  SpO2: 95% 97%   Vitals:   11/15/16 0203 11/15/16 0804 11/15/16 0914 11/15/16 1448  BP: (!) 141/61  (!) 165/73 (!) 128/58  Pulse: 64  61 63  Resp: 18  20 20   Temp:   98.4 F (36.9 C) 97.6 F (36.4 C)  TempSrc:   Oral Oral  SpO2: 98% 99% 95% 97%  Weight:      Height:        General: Pt is alert, awake, not in acute distress Cardiovascular: RRR, S1/S2 +, no rubs, no gallops Respiratory: CTA bilaterally, no wheezing, no rhonchi Abdominal: Soft, NT, ND, bowel sounds + Extremities: no edema, no cyanosis    The results of significant diagnostics from this hospitalization (including imaging, microbiology, ancillary and laboratory) are listed below for reference.     Microbiology: No results found for this or any previous visit (from the past 240 hour(s)).   Labs: BNP (last 3 results)  Recent Labs  11/14/16 1636  BNP 176.1*   Basic Metabolic Panel:  Recent Labs Lab 11/14/16 1636 11/15/16 0804  NA 139 141  K 3.7 3.4*  CL 103 104  CO2 27 29  GLUCOSE 92 103*  BUN 24* 14  CREATININE 1.17* 0.77  CALCIUM 9.7 9.3   Liver Function Tests:  Recent Labs Lab 11/14/16 1636  AST 29  ALT 16  ALKPHOS 66  BILITOT 0.7  PROT 7.2  ALBUMIN 4.2   No results for input(s): LIPASE, AMYLASE in the last 168 hours. No results for input(s): AMMONIA in the last 168 hours. CBC:  Recent Labs Lab 11/14/16 1636  WBC 6.8  NEUTROABS 4.4  HGB 14.4  HCT 43.5  MCV 96.0  PLT 264   Cardiac Enzymes: No results for input(s): CKTOTAL, CKMB, CKMBINDEX, TROPONINI in the last 168 hours. BNP: Invalid input(s): POCBNP CBG: No results for input(s): GLUCAP in the last 168 hours. D-Dimer No results for input(s): DDIMER in the last 72 hours. Hgb A1c  Recent Labs  11/15/16 0327  HGBA1C 5.9*   Lipid Profile  Recent Labs  11/15/16 0327  CHOL 205*  HDL 64  LDLCALC 126*  TRIG 74  CHOLHDL 3.2   Thyroid function  studies No results for input(s): TSH, T4TOTAL, T3FREE, THYROIDAB in the last 72 hours.  Invalid input(s): FREET3 Anemia work up No results for input(s): VITAMINB12, FOLATE, FERRITIN, TIBC, IRON, RETICCTPCT in the last 72 hours. Urinalysis    Component Value Date/Time   COLORURINE STRAW (A) 11/14/2016 2205   APPEARANCEUR CLEAR 11/14/2016 2205   LABSPEC 1.009 11/14/2016 2205   PHURINE 8.0 11/14/2016 2205   GLUCOSEU NEGATIVE 11/14/2016 2205   HGBUR NEGATIVE 11/14/2016 2205   BILIRUBINUR NEGATIVE 11/14/2016 2205   KETONESUR NEGATIVE 11/14/2016 2205   PROTEINUR 30 (A) 11/14/2016 2205   UROBILINOGEN 0.2 01/26/2015 0325   NITRITE NEGATIVE  11/14/2016 2205   LEUKOCYTESUR NEGATIVE 11/14/2016 2205     SIGNED:   Cordelia Poche, MD [facility] 11/15/2016, 4:32 PM Pager [phone]  If 7PM-7AM, please contact night-coverage [email or web address] Password [email or web address]

## 2016-11-18 ENCOUNTER — Telehealth: Payer: Self-pay

## 2016-11-18 NOTE — Telephone Encounter (Signed)
This is a patient of [place], who was admitted to [facility] after hospitalization. [facility] F/U is needed. Hospital discharge from [facility] on 11/15/2016.

## 2016-11-18 NOTE — Telephone Encounter (Signed)
She is in independent living and will need a clinic appointment unless she was admitted to rehab at [facility].

## 2016-11-19 ENCOUNTER — Telehealth: Payer: Self-pay

## 2016-11-19 NOTE — Telephone Encounter (Signed)
Transition Care Management Follow-up Telephone Call  Date discharged: 11/15/2016  How have you been since you were released from the hospital? "better than I was, but still have BP problem in the afternoons. "  Do you understand why you were in the hospital? yes  Do you understand the discharge instructions? yes  Where were you discharged to? Home   Items Reviewed: Medications reviewed: YES Allergies reviewed: YES Dietary changes reviewed: YES Referrals reviewed: YES   Functional Questionnaire:  Activities of Daily Living (ADLs):   states they are independent in the following: feeding, dressing, bathing, ambulation. States they require assistance with the following: none  Any transportation issues/concerns?: No   Any patient concerns? Yes, BP still a concern.  Confirmed importance and date/time of follow-up visits scheduled  Appointment with Dr.Reed on 11/20/2016 at 8:30am, confirmed.   Confirmed with patient if condition begins to worsen call PCP or go to the ER.  Patient was given the office number and encouraged to call back with question or concerns.  : YES

## 2016-11-19 NOTE — Telephone Encounter (Signed)
Patient will be coming in tomorrow 11/20/16 @ 8:30am.

## 2016-11-19 NOTE — Telephone Encounter (Signed)
I have made the 1st attempt to contact the patient or family member in charge, in order to follow up from recently being discharged from the hospital. I left a message on voicemail but I will make another attempt at a different time.  

## 2016-11-20 ENCOUNTER — Non-Acute Institutional Stay: Payer: Medicare Other | Admitting: Internal Medicine

## 2016-11-20 ENCOUNTER — Telehealth: Payer: Self-pay | Admitting: Internal Medicine

## 2016-11-20 ENCOUNTER — Encounter: Payer: Self-pay | Admitting: Internal Medicine

## 2016-11-20 VITALS — BP 130/62 | HR 79 | Temp 97.6°F | Wt 135.0 lb

## 2016-11-20 DIAGNOSIS — R42 Dizziness and giddiness: Secondary | ICD-10-CM | POA: Diagnosis not present

## 2016-11-20 DIAGNOSIS — R278 Other lack of coordination: Secondary | ICD-10-CM | POA: Diagnosis not present

## 2016-11-20 DIAGNOSIS — I5032 Chronic diastolic (congestive) heart failure: Secondary | ICD-10-CM

## 2016-11-20 DIAGNOSIS — J449 Chronic obstructive pulmonary disease, unspecified: Secondary | ICD-10-CM | POA: Diagnosis not present

## 2016-11-20 DIAGNOSIS — H8110 Benign paroxysmal vertigo, unspecified ear: Secondary | ICD-10-CM | POA: Diagnosis not present

## 2016-11-20 DIAGNOSIS — R2681 Unsteadiness on feet: Secondary | ICD-10-CM | POA: Diagnosis not present

## 2016-11-20 DIAGNOSIS — I10 Essential (primary) hypertension: Secondary | ICD-10-CM | POA: Diagnosis not present

## 2016-11-20 DIAGNOSIS — R2689 Other abnormalities of gait and mobility: Secondary | ICD-10-CM | POA: Diagnosis not present

## 2016-11-20 MED ORDER — LOSARTAN POTASSIUM 100 MG PO TABS: 100.0000 mg | ORAL_TABLET | Freq: Every day | ORAL | 3 refills | Status: DC

## 2016-11-20 MED ORDER — FLUTICASONE PROPIONATE 50 MCG/ACT NA SUSP: 2.0000 | Freq: Every day | NASAL | 99 refills | Status: DC | PRN

## 2016-11-20 NOTE — Patient Instructions (Signed)
Move your advair to morning. Also move losartan (cozaar) to noontime with food. Continue to record your blood pressures and let me know if you continue to have highs at any time of day over 546 systolic (top #).

## 2016-11-20 NOTE — Telephone Encounter (Signed)
Called and spoke to pharmacy tech with [facility] and was advised the pt's Dymista needs a PA. They received an astelin nasal spray in replacement but not the flonase, as this is the second medication in dymista. Spoke with Joellen Jersey, CMA, and was advised CY is ok with astelin and flonase being used in replacement of Dymista. Flonase rx sent to preferred pharmacy. Nothing further needed at this time.

## 2016-11-20 NOTE — Progress Notes (Signed)
Location:  [facility] clinic Provider: Ashdon Gillson L. Mariea Clonts, D.O., C.M.D.  Code Status: DNR Goals of Care:  Advanced Directives 11/20/2016  Does Patient Have a Medical Advance Directive? -  Type of Advance Directive Out of facility DNR (pink MOST or yellow form);Healthcare Power of Attorney  Does patient want to make changes to medical advance directive? -  Copy of [place] in Chart? Yes  Would patient like information on creating a medical advance directive? -  Pre-existing out of facility DNR order (yellow form or pink MOST form) Yellow form placed in chart (order not valid for inpatient use)   Chief Complaint  Patient presents with  . Hospitalization Follow-up    11/14/2016 - 11/15/2016 (21 hours)    HPI: Patient is a 83 y.o. female with h/o hyperlipidemia, htn, COPD, hypothyroidism, DVT and PAF on xarelto, diastolic chf seen today for hospital follow-up s/p admission from 8/9-8/10/18 for hypertension and ataxia.  TOC phone call was done 11/19/16 and pt scheduled today's appt.  Meds reconciled as they always are today upon arrival to clinic.  Home health PT was ordered at discharge.  CTA unremarkable, MRI unremarkable--no stroke.  Carotid dopplers showed 1-39% stenosis in addition to antegrade vertebral artery flow.  BP initially was 220s/100s.  Home meds were continued and amlodipine 5mg  daily added and continued titration of meds recommended.    Today, pt reports to me that her balance problem had actually been far worse for 3 days (as she had reported at the ED).  She started to do the Epley maneuver at home after reading the [facility] website.  She will be seeing PT here at [facility] [place].    Is only using her advair now once a day b/c of the bp going up so high.  Her nasal spray was not covered by insurance and "they" are trying to get that taken care of.    She brought her bp readings.  She's been taking her medications at different times than usual.    She's  taking the amlopine in the morning.  She has been taking the rest of her bp meds in the evening.  She has never taken any medication at noontime.    Her creatinine bumped during her visit to the ED.  Looking back, it's fluctuated considerably over time.  Says she did wait in the ED for 6 hrs before she got brought back.  She does drink at least 6 big glasses of water, maybe one is tea at noon.  She did have part of a kidney removed at one time and drinks well.  She did not eat until 10am then next day at the hospital.  Past Medical History:  Diagnosis Date  . Anemia   . Aortic stenosis    a. Echo 09/06/12 EF 55-60%, no WMA, G2DD, Ao valve sclerosis w/ mod stenosis, LA mildly dilated, PA pressure 37mmHg  . Asthmatic bronchitis   . Complete heart block [facility])    s/p permanent pacemaker 06/27/1999 (Battery change 06/2007 and 2016).  s/p AV nodal ablation by Dr [name] 2016.  [name] COPD (chronic obstructive pulmonary disease) (HCC)    Dr. [name]  . DDD (degenerative disc disease)   . Diastolic dysfunction    a. Echo 09/06/12 EF 55-60%, no WMA, G2DD, Ao valve sclerosis w/ mod stenosis, LA mildly dilated, PA pressure 44mmHg  . Diverticulosis   . DVT (deep vein thrombosis) in pregnancy [facility]) 1954   a. LLE  . Hypertension   .  Hypothyroidism    on medication  . Macular degeneration    Dr. [name]  . Osteoarthrosis, unspecified whether generalized or localized, other specified sites   . PAF (paroxysmal atrial fibrillation) (HCC)    Stopped flecainide, on amiodarone but still has bouts of A FIB (mostly in mornings).   . Pure hypercholesterolemia   . Staghorn calculus    Left    Past Surgical History:  Procedure Laterality Date  . APPENDECTOMY  ~ 1941  . AV NODE ABLATION  05/10/2014  . AV NODE ABLATION N/A 05/10/2014   Procedure: AV NODE ABLATION;  Surgeon: Thompson Grayer, MD;  Location: [facility] CATH LAB;  Service: Cardiovascular;  Laterality: N/A;  . BUNIONECTOMY WITH HAMMERTOE RECONSTRUCTION Bilateral ~ 1990  .  CARDIAC PACEMAKER PLACEMENT  06/27/99   Medtronic PM implanted by Dr [name]  . CARDIOVERSION N/A 12/02/2013   Procedure: CARDIOVERSION;  Surgeon: [name], MD;  Location: [facility] ENDOSCOPY;  Service: Cardiovascular;  Laterality: N/A;  . CATARACT EXTRACTION W/ INTRAOCULAR LENS  IMPLANT, BILATERAL Bilateral   . COLONOSCOPY WITH PROPOFOL N/A 04/22/2013   Procedure: COLONOSCOPY WITH PROPOFOL;  Surgeon: [name], MD;  Location: [facility] ENDOSCOPY;  Service: Endoscopy;  Laterality: N/A;  . DILATION AND CURETTAGE OF UTERUS  X 2   "when I was going thru menopause"  . ESOPHAGOGASTRODUODENOSCOPY (EGD) WITH PROPOFOL N/A 04/22/2013   Procedure: ESOPHAGOGASTRODUODENOSCOPY (EGD) WITH PROPOFOL;  Surgeon: [name], MD;  Location: [facility] ENDOSCOPY;  Service: Endoscopy;  Laterality: N/A;  . HERNIA REPAIR    . INCISIONAL HERNIA REPAIR    . INSERT / REPLACE / REMOVE PACEMAKER  06/2007   "took out the old; put in new"  . INSERT / REPLACE / REMOVE PACEMAKER  05/10/2014   MDT PPM generator change by Dr [name]  . JOINT REPLACEMENT    . PARTIAL NEPHRECTOMY Left 05/1974   stone disease  . PERMANENT PACEMAKER GENERATOR CHANGE N/A 05/10/2014   Procedure: PERMANENT PACEMAKER GENERATOR CHANGE;  Surgeon: Thompson Grayer, MD;  Location: [facility] CATH LAB;  Service: Cardiovascular;  Laterality: N/A;  . TONSILLECTOMY AND ADENOIDECTOMY  1930's  . TOTAL KNEE ARTHROPLASTY Right 2001    Allergies  Allergen Reactions  . Brimonidine Tartrate-Timolol Other (See Comments)    REACTION: systemic malaise amigen eye drop  . Penicillins Hives  . Breo Ellipta [Fluticasone Furoate-Vilanterol] Other (See Comments)    Ran blood pressure up    Allergies as of 11/20/2016      Reactions   Brimonidine Tartrate-timolol Other (See Comments)   REACTION: systemic malaise amigen eye drop   Penicillins Hives   Breo Ellipta [fluticasone Furoate-vilanterol] Other (See Comments)   Ran blood pressure up      Medication List       Accurate as of  11/20/16  8:32 AM. Always use your most recent med list.          albuterol 108 (90 Base) MCG/ACT inhaler Commonly known as:  PROVENTIL HFA;VENTOLIN HFA Inhale 2 puffs into the lungs every 6 (six) hours as needed for wheezing or shortness of breath.   amLODipine 5 MG tablet Commonly known as:  NORVASC Take 1 tablet (5 mg total) by mouth daily.   azelastine 0.1 % nasal spray Commonly known as:  ASTELIN Place 1-2 sprays into both nostrils 2 (two) times daily. Use in each nostril as directed   Biotin 1 MG Caps Take by mouth as directed.   Fluticasone-Salmeterol 250-50 MCG/DOSE Aepb Commonly known as:  ADVAIR Inhale 1 puff  into the lungs 2 (two) times daily.   furosemide 40 MG tablet Commonly known as:  LASIX TAKE 1 TABLET BY MOUTH  DAILY   levothyroxine 88 MCG tablet Commonly known as:  SYNTHROID, LEVOTHROID Take 1 tablet (88 mcg total) by mouth daily before breakfast.   losartan 100 MG tablet Commonly known as:  COZAAR Take 1 tablet (100 mg total) by mouth daily.   LUTEIN PO Take 25 mg by mouth daily.   Magnesium 250 MG Tabs Take 1 tablet by mouth daily.   metoprolol succinate 50 MG 24 hr tablet Commonly known as:  TOPROL-XL Take 1 tablet (50 mg total) by mouth every evening. Take with or immediately following a meal.   montelukast 10 MG tablet Commonly known as:  SINGULAIR Take 1 tablet (10 mg total) by mouth daily.   potassium chloride SA 20 MEQ tablet Commonly known as:  K-DUR,KLOR-CON Take 20 mEq by mouth daily.   PRESERVISION AREDS 2 Caps Take 1 capsule by mouth daily.   vitamin B-12 1000 MCG tablet Commonly known as:  CYANOCOBALAMIN Take 1,000 mcg by mouth daily.   Vitamin D 2000 units Caps Take 1 capsule by mouth daily.   XARELTO 15 MG Tabs tablet Generic drug:  Rivaroxaban TAKE 1 TABLET BY MOUTH  DAILY WITH SUPPER       Review of Systems:  Review of Systems  Constitutional: Negative for chills, fever and malaise/fatigue.  HENT: Positive  for hearing loss.        Quite HOH despite hearing aids  Eyes: Positive for blurred vision.       Glasses  Respiratory: Positive for cough and shortness of breath. Negative for sputum production and wheezing.   Cardiovascular: Negative for chest pain, palpitations and leg swelling.  Gastrointestinal: Negative for abdominal pain, blood in stool, constipation, diarrhea and melena.  Genitourinary: Negative for dysuria.  Musculoskeletal: Negative for falls.  Skin: Negative for itching and rash.  Neurological: Negative for dizziness, loss of consciousness and weakness.       Vertigo, doing epley maneuver and improving  Endo/Heme/Allergies: Bruises/bleeds easily.  Psychiatric/Behavioral: Negative for depression and memory loss. The patient is not nervous/anxious.     Health Maintenance  Topic Date Due  . TETANUS/TDAP  02/15/1945  . DEXA SCAN  02/14/1993  . PNA vac Low Risk Adult (2 of 2 - PCV13) 11/24/2014  . INFLUENZA VACCINE  11/06/2016    Physical Exam: Vitals:   11/20/16 0819  BP: 130/62  Pulse: 79  Temp: 97.6 F (36.4 C)  TempSrc: Oral  SpO2: 99%  Weight: 135 lb (61.2 kg)   Body mass index is 23.17 kg/m. Physical Exam  Constitutional: She is oriented to person, place, and time. She appears well-developed and well-nourished. No distress.  HENT:  Head: Normocephalic and atraumatic.  Hearing aids, but still very HOH  Eyes:  glasses  Cardiovascular: Intact distal pulses.   irreg irreg  Pulmonary/Chest: Effort normal and breath sounds normal. No respiratory distress.  Musculoskeletal: Normal range of motion. She exhibits no edema, tenderness or deformity.  Gait is a bit unsteady and goes to the sides when walking, appears hesitant  Neurological: She is alert and oriented to person, place, and time. No cranial nerve deficit.  Skin: Skin is warm and dry. Capillary refill takes less than 2 seconds.  Psychiatric: She has a normal mood and affect.    Labs reviewed: Basic  Metabolic Panel:  Recent Labs  04/30/16 1057 11/14/16 1636 11/15/16 0804  NA 144 139  141  K 4.7 3.7 3.4*  CL  --  103 104  CO2  --  27 29  GLUCOSE  --  92 103*  BUN 20 24* 14  CREATININE 0.7 1.17* 0.77  CALCIUM  --  9.7 9.3  TSH 1.05  --   --    Liver Function Tests:  Recent Labs  04/30/16 1057 11/14/16 1636  AST 22 29  ALT 13 16  ALKPHOS 68 66  BILITOT  --  0.7  PROT  --  7.2  ALBUMIN  --  4.2   No results for input(s): LIPASE, AMYLASE in the last 8760 hours. No results for input(s): AMMONIA in the last 8760 hours. CBC:  Recent Labs  04/30/16 1057 11/14/16 1636  WBC 5.3 6.8  NEUTROABS  --  4.4  HGB 13.9 14.4  HCT 44 43.5  MCV  --  96.0  PLT 256 264   Lipid Panel:  Recent Labs  11/15/16 0327  CHOL 205*  HDL 64  LDLCALC 126*  TRIG 74  CHOLHDL 3.2   Lab Results  Component Value Date   HGBA1C 5.9 (H) 11/15/2016    Procedures since last visit: Ct Head Wo Contrast  Result Date: 11/14/2016 CLINICAL DATA:  Unsteady gait with dizziness. EXAM: CT HEAD WITHOUT CONTRAST TECHNIQUE: Contiguous axial images were obtained from the base of the skull through the vertex without intravenous contrast. COMPARISON:  01/25/2015 FINDINGS: Brain: Chronic moderate superficial atrophy with moderate degree of chronic small vessel ischemic disease of periventricular and subcortical white matter. No large vascular territory infarct. No intracranial hemorrhage, midline shift or edema. No intra-axial mass nor extra-axial fluid collections. No hydrocephalus. Vascular: Moderate atherosclerosis of the carotid siphons. No hyperdense vessels. Skull: No skull fracture or suspicious osseous lesions. Sinuses/Orbits: No acute sinus disease. Partial opacification of the right inferior mastoid consistent with a small effusion. This appears chronic. Cataract extraction bilaterally. Intact orbits. Other: None IMPRESSION: 1. Atrophy with chronic moderate small vessel ischemic disease of the brain.  2. No acute intracranial abnormality. Electronically Signed   By: Ashley Royalty M.D.   On: 11/14/2016 21:52    Assessment/Plan 1. Essential hypertension -bp this am great for her since amlodipine added -not entirely clear why bp was suddenly as high as it was, and now it seems she did actually have acute vertigo for three days or so which I was not advised was the case (balance problem for a while was what I knew) -cont toprol xl in evening, take losartan now at noon b/c of high afternoon bps still into 170s and take amlodipine in the am -cont to monitor bps and call back Tuesday if readings are still over 151 systolic at any time of the day  2. Chronic diastolic congestive heart failure (HCC) -cont current regimen  3. COPD mixed type ([place]) -cont advair but move to am b/c of high bp in late afternoons/evenings -she has already begun just once a day use b/c of bp on her own and tolerating ok  4. Benign paroxysmal positional vertigo, unspecified laterality -to see PT for eval and tx -has been doing Epley maneuver on her own and thinks it's helped -I didn't see her while she was still having the symptoms acutely so I'm not certain she for sure had this and it was not diagnosed at the hospital -stroke workup for ataxia and high bp was negative for any acute strokes on CT or CTA, mild carotid disease on doppler  Labs/tests ordered:  No new,  but will plan to follow bmp  Next appt:  03/05/2017  Circe Chilton L. Paisley Grajeda, D.O. [facility] [address], [place] [address] Cell Phone (Mon-Fri 8am-5pm):  [phone] On Call:  [phone] & follow prompts after 5pm & weekends Office Phone:  [phone] Office Fax:  [phone]

## 2016-11-22 ENCOUNTER — Telehealth: Payer: Self-pay | Admitting: *Deleted

## 2016-11-22 NOTE — Telephone Encounter (Signed)
Patient called and stated that she just saw Dr. Mariea Clonts on Wednesday and her BP medication times were changed. Patient is taking her BP medications as directed but her BP still remains high. Patient complained of Headache in the evening yesterday but no dizziness.  Takes Amlodipine in the morning, Losartan at noon, Metoprolol in the evening. Please Advise.   11/20/16- 163/99 12:00pm    146/91 3pm    160/96 5pm    180/111 6pm  11/21/16-  150/100 7am    124/86 10am    123/71 11am    126/74 3pm    145/83 4pm    161/103 5pm    180/112 6pm    149/78 8pm    144/84 10pm  11/22/16- 139/98 7:30am

## 2016-11-23 NOTE — Telephone Encounter (Signed)
Let's have her take 1.5 losartan tablets at noontime (150mg ) and see how that goes.

## 2016-11-25 ENCOUNTER — Encounter: Payer: Medicare Other | Admitting: *Deleted

## 2016-11-25 MED ORDER — LOSARTAN POTASSIUM 100 MG PO TABS: ORAL_TABLET | ORAL | 1 refills | Status: DC

## 2016-11-25 NOTE — Telephone Encounter (Signed)
Patient notified and agreed. Medication list updated. Patient will try first before called to pharmacy.

## 2016-11-28 ENCOUNTER — Encounter: Payer: Self-pay | Admitting: Cardiology

## 2016-12-02 ENCOUNTER — Ambulatory Visit (INDEPENDENT_AMBULATORY_CARE_PROVIDER_SITE_OTHER): Payer: Medicare Other | Admitting: *Deleted

## 2016-12-02 DIAGNOSIS — I442 Atrioventricular block, complete: Secondary | ICD-10-CM

## 2016-12-02 NOTE — Progress Notes (Signed)
Remote pacemaker transmission.   

## 2016-12-03 ENCOUNTER — Other Ambulatory Visit: Payer: Self-pay | Admitting: *Deleted

## 2016-12-03 LAB — CUP PACEART REMOTE DEVICE CHECK
Battery Impedance: 134 Ohm
Battery Remaining Longevity: 143 mo
Battery Voltage: 2.8 V
Implantable Lead Implant Date: 20090318
Implantable Lead Location: 753860
Implantable Pulse Generator Implant Date: 20160202
Lead Channel Setting Pacing Pulse Width: 0.4 ms
Lead Channel Setting Sensing Sensitivity: 4 mV
MDC IDC LEAD IMPLANT DT: 20090318
MDC IDC LEAD LOCATION: 753859
MDC IDC MSMT LEADCHNL RA IMPEDANCE VALUE: 67 Ohm
MDC IDC MSMT LEADCHNL RV IMPEDANCE VALUE: 751 Ohm
MDC IDC MSMT LEADCHNL RV PACING THRESHOLD AMPLITUDE: 1.125 V
MDC IDC MSMT LEADCHNL RV PACING THRESHOLD PULSEWIDTH: 0.4 ms
MDC IDC SESS DTM: 20180827135306
MDC IDC SET LEADCHNL RV PACING AMPLITUDE: 2.5 V
MDC IDC STAT BRADY RV PERCENT PACED: 98 %

## 2016-12-03 MED ORDER — AMLODIPINE BESYLATE 5 MG PO TABS: 5.0000 mg | ORAL_TABLET | Freq: Every day | ORAL | 3 refills | Status: DC

## 2016-12-03 MED ORDER — LOSARTAN POTASSIUM 100 MG PO TABS: ORAL_TABLET | ORAL | 3 refills | Status: DC

## 2016-12-03 NOTE — Telephone Encounter (Signed)
Patient requested Rx RF to be sent to [facility].

## 2016-12-13 ENCOUNTER — Encounter: Payer: Self-pay | Admitting: Cardiology

## 2017-01-07 DIAGNOSIS — D3132 Benign neoplasm of left choroid: Secondary | ICD-10-CM | POA: Diagnosis not present

## 2017-01-07 DIAGNOSIS — H353131 Nonexudative age-related macular degeneration, bilateral, early dry stage: Secondary | ICD-10-CM | POA: Diagnosis not present

## 2017-01-07 DIAGNOSIS — H401131 Primary open-angle glaucoma, bilateral, mild stage: Secondary | ICD-10-CM | POA: Diagnosis not present

## 2017-01-07 DIAGNOSIS — H40053 Ocular hypertension, bilateral: Secondary | ICD-10-CM | POA: Diagnosis not present

## 2017-01-07 DIAGNOSIS — Z961 Presence of intraocular lens: Secondary | ICD-10-CM | POA: Diagnosis not present

## 2017-01-07 DIAGNOSIS — H35363 Drusen (degenerative) of macula, bilateral: Secondary | ICD-10-CM | POA: Diagnosis not present

## 2017-01-30 DIAGNOSIS — Z23 Encounter for immunization: Secondary | ICD-10-CM | POA: Diagnosis not present

## 2017-03-03 ENCOUNTER — Ambulatory Visit (INDEPENDENT_AMBULATORY_CARE_PROVIDER_SITE_OTHER): Payer: Medicare Other | Admitting: *Deleted

## 2017-03-03 DIAGNOSIS — I442 Atrioventricular block, complete: Secondary | ICD-10-CM | POA: Diagnosis not present

## 2017-03-03 DIAGNOSIS — T1511XA Foreign body in conjunctival sac, right eye, initial encounter: Secondary | ICD-10-CM | POA: Diagnosis not present

## 2017-03-04 NOTE — Progress Notes (Signed)
Remote pacemaker transmission.   

## 2017-03-05 ENCOUNTER — Encounter: Payer: Self-pay | Admitting: Internal Medicine

## 2017-03-05 ENCOUNTER — Non-Acute Institutional Stay: Payer: Medicare Other | Admitting: Internal Medicine

## 2017-03-05 VITALS — BP 120/78 | HR 75 | Temp 97.9°F | Wt 137.0 lb

## 2017-03-05 DIAGNOSIS — I4821 Permanent atrial fibrillation: Secondary | ICD-10-CM

## 2017-03-05 DIAGNOSIS — L400 Psoriasis vulgaris: Secondary | ICD-10-CM | POA: Diagnosis not present

## 2017-03-05 DIAGNOSIS — Z85828 Personal history of other malignant neoplasm of skin: Secondary | ICD-10-CM | POA: Diagnosis not present

## 2017-03-05 DIAGNOSIS — J449 Chronic obstructive pulmonary disease, unspecified: Secondary | ICD-10-CM

## 2017-03-05 DIAGNOSIS — D6869 Other thrombophilia: Secondary | ICD-10-CM | POA: Insufficient documentation

## 2017-03-05 DIAGNOSIS — R21 Rash and other nonspecific skin eruption: Secondary | ICD-10-CM

## 2017-03-05 DIAGNOSIS — I5032 Chronic diastolic (congestive) heart failure: Secondary | ICD-10-CM | POA: Diagnosis not present

## 2017-03-05 DIAGNOSIS — E039 Hypothyroidism, unspecified: Secondary | ICD-10-CM | POA: Diagnosis not present

## 2017-03-05 DIAGNOSIS — I482 Chronic atrial fibrillation: Secondary | ICD-10-CM | POA: Diagnosis not present

## 2017-03-05 DIAGNOSIS — I10 Essential (primary) hypertension: Secondary | ICD-10-CM | POA: Diagnosis not present

## 2017-03-05 DIAGNOSIS — D485 Neoplasm of uncertain behavior of skin: Secondary | ICD-10-CM | POA: Diagnosis not present

## 2017-03-05 DIAGNOSIS — C44722 Squamous cell carcinoma of skin of right lower limb, including hip: Secondary | ICD-10-CM | POA: Diagnosis not present

## 2017-03-05 DIAGNOSIS — I4891 Unspecified atrial fibrillation: Secondary | ICD-10-CM

## 2017-03-05 NOTE — Progress Notes (Signed)
Location:  Occupational psychologist of Service:  Clinic (12)  Provider: Alizon Schmeling L. Mariea Clonts, D.O., C.M.D.  Code Status: DNR Goals of Care:  Advanced Directives 03/05/2017  Does Patient Have a Medical Advance Directive? Yes  Type of Paramedic of [place];Out of facility DNR (pink MOST or yellow form)  Does patient want to make changes to medical advance directive? No - Patient declined  Copy of [place] in Chart? Yes  Would patient like information on creating a medical advance directive? -  Pre-existing out of facility DNR order (yellow form or pink MOST form) Yellow form placed in chart (order not valid for inpatient use)     Chief Complaint  Patient presents with  . Medical Management of Chronic Issues    92mth follow-up, rash has followup with dermatology    HPI: Patient is a 83 y.o. female seen today for medical management of chronic diseases.    Has rash for longer than 3 weeks and is planning to see dermatology.  On arms and legs and on chest.  Goes this afternoon to appt.  Had been a spot here or there, but now all over arms and legs.  Has put aveeno lotion on it.  Yesterday, she used cortisone cream on her genital area which was also itching. She's not sleeping well with the itching. They start off as red bumps and then dry up.  One dried up raised spot on back of right leg does hurt.  No changes in regimen as far as soaps, makeup, lotions, detergents.  No new meds lately either.  Had psoriasis at one time.   Looks different than that.    This is her doctor week.  Sees cardiologist tomorrow.  No recent difficulty with dizziness, palpitations, chest pain.  No increased edema.    Monday, eye was driving her nuts and eye doctor found a hair in it.  She has no pets.  Does not know where the hair came from.  COPD:  Breathing pretty good.  Not as good early in the morning, but improves as day goes on.  As good as it's been  in a while though.  On advair.  Not using albuterol lately.  Also on singulair.  HTN:  bp excellent this am even with advair use.  Had difficulty controlling with steroid use.  On toprol, cozaar, lasix, amlodipine now.    Past Medical History:  Diagnosis Date  . Anemia   . Aortic stenosis    a. Echo 09/06/12 EF 55-60%, no WMA, G2DD, Ao valve sclerosis w/ mod stenosis, LA mildly dilated, PA pressure 26mmHg  . Asthmatic bronchitis   . Complete heart block [facility])    s/p permanent pacemaker 06/27/1999 (Battery change 06/2007 and 2016).  s/p AV nodal ablation by Dr [name] 2016.  [name] COPD (chronic obstructive pulmonary disease) (HCC)    Dr. [name]  . DDD (degenerative disc disease)   . Diastolic dysfunction    a. Echo 09/06/12 EF 55-60%, no WMA, G2DD, Ao valve sclerosis w/ mod stenosis, LA mildly dilated, PA pressure 27mmHg  . Diverticulosis   . DVT (deep vein thrombosis) in pregnancy [facility]) 1954   a. LLE  . Hypertension   . Hypothyroidism    on medication  . Macular degeneration    Dr. [name]  . Osteoarthrosis, unspecified whether generalized or localized, other specified sites   . PAF (paroxysmal atrial fibrillation) (HCC)    Stopped flecainide, on amiodarone but still  has bouts of A FIB (mostly in mornings).   . Pure hypercholesterolemia   . Staghorn calculus    Left    Past Surgical History:  Procedure Laterality Date  . APPENDECTOMY  ~ 1941  . AV NODE ABLATION  05/10/2014  . AV NODE ABLATION N/A 05/10/2014   Procedure: AV NODE ABLATION;  Surgeon: Thompson Grayer, MD;  Location: [facility] CATH LAB;  Service: Cardiovascular;  Laterality: N/A;  . BUNIONECTOMY WITH HAMMERTOE RECONSTRUCTION Bilateral ~ 1990  . CARDIAC PACEMAKER PLACEMENT  06/27/99   Medtronic PM implanted by Dr [name]  . CARDIOVERSION N/A 12/02/2013   Procedure: CARDIOVERSION;  Surgeon: [name], MD;  Location: [facility] ENDOSCOPY;  Service: Cardiovascular;  Laterality: N/A;  . CATARACT EXTRACTION W/ INTRAOCULAR LENS  IMPLANT, BILATERAL  Bilateral   . COLONOSCOPY WITH PROPOFOL N/A 04/22/2013   Procedure: COLONOSCOPY WITH PROPOFOL;  Surgeon: [name], MD;  Location: [facility] ENDOSCOPY;  Service: Endoscopy;  Laterality: N/A;  . DILATION AND CURETTAGE OF UTERUS  X 2   "when I was going thru menopause"  . ESOPHAGOGASTRODUODENOSCOPY (EGD) WITH PROPOFOL N/A 04/22/2013   Procedure: ESOPHAGOGASTRODUODENOSCOPY (EGD) WITH PROPOFOL;  Surgeon: [name], MD;  Location: [facility] ENDOSCOPY;  Service: Endoscopy;  Laterality: N/A;  . HERNIA REPAIR    . INCISIONAL HERNIA REPAIR    . INSERT / REPLACE / REMOVE PACEMAKER  06/2007   "took out the old; put in new"  . INSERT / REPLACE / REMOVE PACEMAKER  05/10/2014   MDT PPM generator change by Dr [name]  . JOINT REPLACEMENT    . PARTIAL NEPHRECTOMY Left 05/1974   stone disease  . PERMANENT PACEMAKER GENERATOR CHANGE N/A 05/10/2014   Procedure: PERMANENT PACEMAKER GENERATOR CHANGE;  Surgeon: Thompson Grayer, MD;  Location: [facility] CATH LAB;  Service: Cardiovascular;  Laterality: N/A;  . TONSILLECTOMY AND ADENOIDECTOMY  1930's  . TOTAL KNEE ARTHROPLASTY Right 2001    Allergies  Allergen Reactions  . Brimonidine Tartrate-Timolol Other (See Comments)    REACTION: systemic malaise amigen eye drop  . Penicillins Hives  . Breo Ellipta [Fluticasone Furoate-Vilanterol] Other (See Comments)    Ran blood pressure up    Outpatient Encounter Medications as of 03/05/2017  Medication Sig  . albuterol (PROVENTIL HFA;VENTOLIN HFA) 108 (90 Base) MCG/ACT inhaler Inhale 2 puffs into the lungs every 6 (six) hours as needed for wheezing or shortness of breath.  [name] amLODipine (NORVASC) 5 MG tablet Take 1 tablet (5 mg total) by mouth daily.  [name] azelastine (ASTELIN) 0.1 % nasal spray Place 1-2 sprays into both nostrils 2 (two) times daily. Use in each nostril as directed  . Biotin 1 MG CAPS Take by mouth as directed.   . Cholecalciferol (VITAMIN D) 2000 units CAPS Take 1 capsule by mouth daily.  . Fluticasone-Salmeterol  (ADVAIR) 250-50 MCG/DOSE AEPB Inhale 1 puff into the lungs every morning.  . furosemide (LASIX) 40 MG tablet TAKE 1 TABLET BY MOUTH  DAILY  . levothyroxine (SYNTHROID, LEVOTHROID) 88 MCG tablet Take 1 tablet (88 mcg total) by mouth daily before breakfast.  . losartan (COZAAR) 100 MG tablet Take One and a Half tablet (150mg ) by mouth once daily At noontime  . LUTEIN PO Take 25 mg by mouth daily.   . Magnesium 250 MG TABS Take 1 tablet by mouth daily.  . metoprolol succinate (TOPROL-XL) 50 MG 24 hr tablet Take 1 tablet (50 mg total) by mouth every evening. Take with or immediately following a meal.  . montelukast (SINGULAIR) 10  MG tablet Take 1 tablet (10 mg total) by mouth daily.  . Multiple Vitamins-Minerals (PRESERVISION AREDS 2) CAPS Take 1 capsule by mouth daily.  . potassium chloride SA (K-DUR,KLOR-CON) 20 MEQ tablet Take 20 mEq by mouth daily.   . vitamin B-12 (CYANOCOBALAMIN) 1000 MCG tablet Take 1,000 mcg by mouth daily.  Jody Blake 15 MG TABS tablet TAKE 1 TABLET BY MOUTH  DAILY WITH SUPPER  . [DISCONTINUED] fluticasone (FLONASE) 50 MCG/ACT nasal spray Place 2 sprays into both nostrils daily as needed for allergies or rhinitis.   No facility-administered encounter medications on file as of 03/05/2017.     Review of Systems:  Review of Systems  Constitutional: Negative for chills, fever and malaise/fatigue.  HENT: Positive for hearing loss. Negative for congestion.   Eyes: Positive for blurred vision.  Respiratory: Positive for cough. Negative for shortness of breath and wheezing.   Cardiovascular: Negative for chest pain and palpitations.  Gastrointestinal: Negative for abdominal pain, blood in stool, constipation and melena.  Genitourinary: Negative for dysuria.  Musculoskeletal: Negative for falls.  Skin: Positive for itching and rash.  Neurological: Negative for dizziness, loss of consciousness and weakness.  Endo/Heme/Allergies: Bruises/bleeds easily.  Psychiatric/Behavioral:  Negative for depression.    Health Maintenance  Topic Date Due  . TETANUS/TDAP  02/15/1947  . DEXA SCAN  02/14/1993  . PNA vac Low Risk Adult (2 of 2 - PCV13) 11/24/2014  . INFLUENZA VACCINE  11/06/2016    Physical Exam: Vitals:   03/05/17 0902  BP: 120/78  Pulse: 75  Temp: 97.9 F (36.6 C)  TempSrc: Oral  SpO2: 99%  Weight: 137 lb (62.1 kg)   Body mass index is 23.52 kg/m. Physical Exam  Constitutional: She is oriented to person, place, and time. She appears well-developed. No distress.  HENT:  Very HOH  Eyes:  Vision poor, uses glasses but cannot recognize people from more than a few feet away  Cardiovascular: Intact distal pulses.  Murmur heard. irreg irreg  Pulmonary/Chest: Effort normal and breath sounds normal. She has no wheezes.  Abdominal: Soft. Bowel sounds are normal.  Musculoskeletal: Normal range of motion.  Walking w/o assistive device  Neurological: She is alert and oriented to person, place, and time.  Skin: Skin is warm and dry. Rash noted.  Rash of anterior chest, arms, legs--papular, pruritic, hard papule on right posterior leg   Psychiatric: She has a normal mood and affect.    Labs reviewed: Basic Metabolic Panel: Recent Labs    04/30/16 1057 11/14/16 1636 11/15/16 0804  NA 144 139 141  K 4.7 3.7 3.4*  CL  --  103 104  CO2  --  27 29  GLUCOSE  --  92 103*  BUN 20 24* 14  CREATININE 0.7 1.17* 0.77  CALCIUM  --  9.7 9.3  TSH 1.05  --   --    Liver Function Tests: Recent Labs    04/30/16 1057 11/14/16 1636  AST 22 29  ALT 13 16  ALKPHOS 68 66  BILITOT  --  0.7  PROT  --  7.2  ALBUMIN  --  4.2   No results for input(s): LIPASE, AMYLASE in the last 8760 hours. No results for input(s): AMMONIA in the last 8760 hours. CBC: Recent Labs    04/30/16 1057 11/14/16 1636  WBC 5.3 6.8  NEUTROABS  --  4.4  HGB 13.9 14.4  HCT 44 43.5  MCV  --  96.0  PLT 256 264   Lipid Panel:  Recent Labs    11/15/16 0327  CHOL 205*  HDL  64  LDLCALC 126*  TRIG 74  CHOLHDL 3.2   Lab Results  Component Value Date   HGBA1C 5.9 (H) 11/15/2016   Assessment/Plan 1. Papular rash, generalized -new onset over past 3 mos (not sure why she didn't come here while waiting on derm appt), but worse lately -had itching in genital area also but resolved with cortisone--had no discharge or pain--refused exam this am, says she'll have derm look at it this afternoon -advised to use cortisone on other areas until appt this afternoon with derm to see if she gets relief--aveeno has not worked  2. COPD mixed type ([place]) -ongoing but recently improved, cont singulair, advair, prn albuterol  3. Chronic diastolic congestive heart failure ([place]) -controlled, cont current regimen with beta blocker, arb and lasix  4. Acquired hypothyroidism -cont current levothyroxine therapy Lab Results  Component Value Date   TSH 1.05 04/30/2016   5. Permanent atrial fibrillation (HCC) -cont beta blocker, has pacemaker in place and sees cardiology tomorrow  6. Hypercoagulable state due to atrial fibrillation (HCC) -cont xarelto for anticoagulation, no complications  7. Essential hypertension -bp controlled now, had difficulty when steroid inhalers were started -cont same regimen  Labs/tests ordered:  No new Next appt:  07/09/2017  Jody Blake L. Malarie Tappen, D.O. [facility] [address], [place] [address] Cell Phone (Mon-Fri 8am-5pm):  [phone] On Call:  [phone] & follow prompts after 5pm & weekends Office Phone:  [phone] Office Fax:  [phone]

## 2017-03-06 ENCOUNTER — Encounter: Payer: Self-pay | Admitting: Cardiology

## 2017-03-06 ENCOUNTER — Ambulatory Visit (INDEPENDENT_AMBULATORY_CARE_PROVIDER_SITE_OTHER): Payer: Medicare Other | Admitting: Cardiology

## 2017-03-06 VITALS — BP 140/100 | HR 86 | Ht 64.0 in | Wt 136.1 lb

## 2017-03-06 DIAGNOSIS — I10 Essential (primary) hypertension: Secondary | ICD-10-CM

## 2017-03-06 DIAGNOSIS — I442 Atrioventricular block, complete: Secondary | ICD-10-CM

## 2017-03-06 DIAGNOSIS — I4821 Permanent atrial fibrillation: Secondary | ICD-10-CM

## 2017-03-06 DIAGNOSIS — I482 Chronic atrial fibrillation: Secondary | ICD-10-CM

## 2017-03-06 DIAGNOSIS — Z950 Presence of cardiac pacemaker: Secondary | ICD-10-CM

## 2017-03-06 LAB — CUP PACEART REMOTE DEVICE CHECK
Battery Remaining Longevity: 142 mo
Implantable Lead Implant Date: 20090318
Implantable Lead Location: 753860
Implantable Lead Model: 5076
Implantable Lead Model: 5092
Implantable Pulse Generator Implant Date: 20160202
Lead Channel Impedance Value: 700 Ohm
Lead Channel Pacing Threshold Amplitude: 1.125 V
Lead Channel Setting Sensing Sensitivity: 4 mV
MDC IDC LEAD IMPLANT DT: 20090318
MDC IDC LEAD LOCATION: 753859
MDC IDC MSMT BATTERY IMPEDANCE: 135 Ohm
MDC IDC MSMT BATTERY VOLTAGE: 2.8 V
MDC IDC MSMT LEADCHNL RA IMPEDANCE VALUE: 67 Ohm
MDC IDC MSMT LEADCHNL RV PACING THRESHOLD PULSEWIDTH: 0.4 ms
MDC IDC SESS DTM: 20181126131952
MDC IDC SET LEADCHNL RV PACING AMPLITUDE: 2.5 V
MDC IDC SET LEADCHNL RV PACING PULSEWIDTH: 0.4 ms
MDC IDC STAT BRADY RV PERCENT PACED: 99 %

## 2017-03-06 NOTE — Progress Notes (Signed)
Jody Blake. [address], [address], [place]  [address] Phone: [phone] Fax:  [phone]  Date:  03/06/2017   ID:  Jody Blake, DOB 12/09/1925, MRN [id number]  PCP:  Jody Curry, DO   History of Present Illness: Jody Blake is a 83 y.o. female with permanent pacemaker placement secondary to AV nodal ablation because of difficult to control atrial fibrillation with rapid ventricular response despite heavy doses of antiarrhythmic therapy.  Has been living at [facility]. Doing much better since ablative therapy.   Her husband, Jody Blake, has been quite feeble recently. Dementia, does not talk.  He enjoys the [employer].  She told me a story where their caregiver said that you will make acute couple and he laughed.  He had not done that in quite some time.  Overall she has no complaints today.  She did have a hospitalization in August 2018 with ataxia, her blood pressure was out of control.  Once regulated, things resolved.  No chest pain, no shortness of breath.  Troponin was normal.  No shortness of breath syncope orthopnea PND bleeding.  She is on Xarelto.  Doing well.   Wt Readings from Last 3 Encounters:  03/06/17 136 lb 1.9 oz (61.7 kg)  03/05/17 137 lb (62.1 kg)  11/20/16 135 lb (61.2 kg)     Past Medical History:  Diagnosis Date  . Anemia   . Aortic stenosis    a. Echo 09/06/12 EF 55-60%, no WMA, G2DD, Ao valve sclerosis w/ mod stenosis, LA mildly dilated, PA pressure 55mmHg  . Asthmatic bronchitis   . Complete heart block [facility])    s/p permanent pacemaker 06/27/1999 (Battery change 06/2007 and 2016).  s/p AV nodal ablation by Dr [name] 2016.  [name] COPD (chronic obstructive pulmonary disease) (HCC)    Dr. [name]  . DDD (degenerative disc disease)   . Diastolic dysfunction    a. Echo 09/06/12 EF 55-60%, no WMA, G2DD, Ao valve sclerosis w/ mod stenosis, LA mildly dilated, PA pressure 60mmHg  . Diverticulosis   . DVT (deep vein thrombosis) in pregnancy [facility]) 1954     a. LLE  . Hypertension   . Hypothyroidism    on medication  . Macular degeneration    Dr. [name]  . Osteoarthrosis, unspecified whether generalized or localized, other specified sites   . PAF (paroxysmal atrial fibrillation) (HCC)    Stopped flecainide, on amiodarone but still has bouts of A FIB (mostly in mornings).   . Pure hypercholesterolemia   . Staghorn calculus    Left    Past Surgical History:  Procedure Laterality Date  . APPENDECTOMY  ~ 1941  . AV NODE ABLATION  05/10/2014  . AV NODE ABLATION N/A 05/10/2014   Procedure: AV NODE ABLATION;  Surgeon: Thompson Grayer, MD;  Location: [facility] CATH LAB;  Service: Cardiovascular;  Laterality: N/A;  . BUNIONECTOMY WITH HAMMERTOE RECONSTRUCTION Bilateral ~ 1990  . CARDIAC PACEMAKER PLACEMENT  06/27/99   Medtronic PM implanted by Dr [name]  . CARDIOVERSION N/A 12/02/2013   Procedure: CARDIOVERSION;  Surgeon: [name], MD;  Location: [facility] ENDOSCOPY;  Service: Cardiovascular;  Laterality: N/A;  . CATARACT EXTRACTION W/ INTRAOCULAR LENS  IMPLANT, BILATERAL Bilateral   . COLONOSCOPY WITH PROPOFOL N/A 04/22/2013   Procedure: COLONOSCOPY WITH PROPOFOL;  Surgeon: [name], MD;  Location: [facility] ENDOSCOPY;  Service: Endoscopy;  Laterality: N/A;  . DILATION AND CURETTAGE OF UTERUS  X 2   "when I was going  thru menopause"  . ESOPHAGOGASTRODUODENOSCOPY (EGD) WITH PROPOFOL N/A 04/22/2013   Procedure: ESOPHAGOGASTRODUODENOSCOPY (EGD) WITH PROPOFOL;  Surgeon: [name], MD;  Location: [facility] ENDOSCOPY;  Service: Endoscopy;  Laterality: N/A;  . HERNIA REPAIR    . INCISIONAL HERNIA REPAIR    . INSERT / REPLACE / REMOVE PACEMAKER  06/2007   "took out the old; put in new"  . INSERT / REPLACE / REMOVE PACEMAKER  05/10/2014   MDT PPM generator change by Dr [name]  . JOINT REPLACEMENT    . PARTIAL NEPHRECTOMY Left 05/1974   stone disease  . PERMANENT PACEMAKER GENERATOR CHANGE N/A 05/10/2014   Procedure: PERMANENT PACEMAKER GENERATOR CHANGE;  Surgeon:  Thompson Grayer, MD;  Location: [facility] CATH LAB;  Service: Cardiovascular;  Laterality: N/A;  . TONSILLECTOMY AND ADENOIDECTOMY  1930's  . TOTAL KNEE ARTHROPLASTY Right 2001    Current Outpatient Medications  Medication Sig Dispense Refill  . albuterol (PROVENTIL HFA;VENTOLIN HFA) 108 (90 Base) MCG/ACT inhaler Inhale 2 puffs into the lungs every 6 (six) hours as needed for wheezing or shortness of breath. 1 Inhaler 12  . amLODipine (NORVASC) 5 MG tablet Take 1 tablet (5 mg total) by mouth daily. 90 tablet 3  . azelastine (ASTELIN) 0.1 % nasal spray Place 1-2 sprays into both nostrils 2 (two) times daily. Use in each nostril as directed 30 mL prn  . Biotin 1 MG CAPS Take by mouth as directed.     . Cholecalciferol (VITAMIN D) 2000 units CAPS Take 1 capsule by mouth daily.    . Fluticasone-Salmeterol (ADVAIR) 250-50 MCG/DOSE AEPB Inhale 1 puff into the lungs every morning.    . furosemide (LASIX) 40 MG tablet TAKE 1 TABLET BY MOUTH  DAILY 90 tablet 1  . levothyroxine (SYNTHROID, LEVOTHROID) 88 MCG tablet Take 1 tablet (88 mcg total) by mouth daily before breakfast. 90 tablet 3  . losartan (COZAAR) 100 MG tablet Take One and a Half tablet (150mg ) by mouth once daily At noontime 135 tablet 3  . LUTEIN PO Take 25 mg by mouth daily.     . Magnesium 250 MG TABS Take 1 tablet by mouth daily.    . metoprolol succinate (TOPROL-XL) 50 MG 24 hr tablet Take 1 tablet (50 mg total) by mouth every evening. Take with or immediately following a meal. 90 tablet 3  . montelukast (SINGULAIR) 10 MG tablet Take 1 tablet (10 mg total) by mouth daily. 90 tablet 3  . Multiple Vitamins-Minerals (PRESERVISION AREDS 2) CAPS Take 1 capsule by mouth daily.    . potassium chloride SA (K-DUR,KLOR-CON) 20 MEQ tablet Take 20 mEq by mouth daily.     . vitamin B-12 (CYANOCOBALAMIN) 1000 MCG tablet Take 1,000 mcg by mouth daily.    Alveda Reasons 15 MG TABS tablet TAKE 1 TABLET BY MOUTH  DAILY WITH SUPPER 90 tablet 1   No current  facility-administered medications for this visit.     Allergies:    Allergies  Allergen Reactions  . Brimonidine Tartrate-Timolol Other (See Comments)    REACTION: systemic malaise amigen eye drop  . Penicillins Hives  . Breo Ellipta [Fluticasone Furoate-Vilanterol] Other (See Comments)    Ran blood pressure up    Social History:  The patient  reports that  has never smoked. she has never used smokeless tobacco. She reports that she drinks about 4.2 oz of alcohol per week. She reports that she does not use drugs.   Family History  Problem Relation Age of Onset  .  Malignant hypertension Father   . Hypertension Father   . Renal Disease Father   . Breast cancer Mother   . Heart attack Brother   . Stroke Brother     ROS:  Please see the history of present illness.   No melena, no bleeding. Fatigue has improved dramatically. No shortness of breath, no chest pain.   All other systems reviewed and negative.   PHYSICAL EXAM: VS:  BP (!) 140/100   Pulse 86   Ht 5\' 4"  (1.626 m)   Wt 136 lb 1.9 oz (61.7 kg)   LMP 04/08/1974   SpO2 97%   BMI 23.36 kg/m  GEN: Well nourished, well developed, in no acute distress  HEENT: normal  Neck: no JVD, carotid bruits, or masses Cardiac: RRR; no murmurs, rubs, or gallops,no edema  Respiratory:  clear to auscultation bilaterally, normal work of breathing GI: soft, nontender, nondistended, + BS MS: no deformity or atrophy  Skin: warm and dry, no rash Neuro:  Alert and Oriented x 3, Strength and sensation are intact Psych: euthymic mood, full affect   EKG:  None today     Labs: Hospital lab work reviewed, last creatinine 0.7  ASSESSMENT AND PLAN:  1. Complete heart block-difficult to control atrial fibrillation heart rates, AV nodal ablation with permanent pacemaker. She is feeling so much better post ablation.  Seems to be doing quite well.  Remember, she is pacemaker dependent. 2. Permanent atrial fibrillation-post AV nodal ablation  because of difficult to control heart rate.  No changes 3. Essential HTN -in August hospitalization 2018 she was feeling some ataxia with very high blood pressure.  After blood pressure was well controlled things resolved.  She is doing quite well currently.  Her diastolic was elevated today but at her previous visit with Dr. Hollace Kinnier at [facility], her blood pressure was excellent.  No changes made. 4. Chronic anticoagulation-low-dose Xarelto given age. No bleeding currently. Dr. Hollace Kinnier at [place] monitoring blood work closely.  Make no changes. 5. Hypothyroidism-per Dr. Mariea Clonts.  No changes made. 6. Pacemaker-functioning well.  Excellent. 12 months f/u.   Signed, Candee Furbish, MD [facility]  03/06/2017 10:59 AM

## 2017-03-06 NOTE — Patient Instructions (Signed)

## 2017-03-07 ENCOUNTER — Encounter: Payer: Self-pay | Admitting: Cardiology

## 2017-03-12 ENCOUNTER — Telehealth: Payer: Self-pay | Admitting: *Deleted

## 2017-03-12 MED ORDER — ZOSTER VAC RECOMB ADJUVANTED 50 MCG/0.5ML IM SUSR: 0.5000 mL | Freq: Once | INTRAMUSCULAR | 1 refills | Status: AC

## 2017-03-12 MED FILL — SHINGRIX VIAL KIT: 50 | 1 days supply | Qty: 1 | Fill #0

## 2017-03-12 NOTE — Telephone Encounter (Signed)
rx sent to pharmacy by e-script Spoke with patient and advised vaccine sent to [facility] outpatient pharmacy

## 2017-03-12 NOTE — Telephone Encounter (Signed)
Patient called and stated that she would like to have a shingles vaccine. She stated that [facility] does not have it and wants a rx sent to [place]. Please Advise.

## 2017-03-18 ENCOUNTER — Other Ambulatory Visit: Payer: Self-pay | Admitting: Internal Medicine

## 2017-03-18 ENCOUNTER — Other Ambulatory Visit: Payer: Self-pay | Admitting: Cardiology

## 2017-03-18 DIAGNOSIS — E039 Hypothyroidism, unspecified: Secondary | ICD-10-CM

## 2017-04-11 ENCOUNTER — Other Ambulatory Visit: Payer: Self-pay

## 2017-04-11 MED ORDER — FLUTICASONE-SALMETEROL 250-50 MCG/DOSE IN AEPB: 1.0000 | INHALATION_SPRAY | RESPIRATORY_TRACT | 3 refills | Status: DC

## 2017-04-20 ENCOUNTER — Other Ambulatory Visit: Payer: Self-pay | Admitting: Internal Medicine

## 2017-05-06 DIAGNOSIS — H401132 Primary open-angle glaucoma, bilateral, moderate stage: Secondary | ICD-10-CM | POA: Diagnosis not present

## 2017-05-06 DIAGNOSIS — D3132 Benign neoplasm of left choroid: Secondary | ICD-10-CM | POA: Diagnosis not present

## 2017-05-06 DIAGNOSIS — Z961 Presence of intraocular lens: Secondary | ICD-10-CM | POA: Diagnosis not present

## 2017-05-06 DIAGNOSIS — H353133 Nonexudative age-related macular degeneration, bilateral, advanced atrophic without subfoveal involvement: Secondary | ICD-10-CM | POA: Diagnosis not present

## 2017-05-16 MED FILL — SHINGRIX VIAL KIT: 50 | 1 days supply | Qty: 1 | Fill #1

## 2017-05-27 ENCOUNTER — Non-Acute Institutional Stay: Payer: Medicare Other

## 2017-05-27 VITALS — BP 138/78 | HR 83 | Temp 97.1°F | Ht 64.0 in | Wt 137.0 lb

## 2017-05-27 DIAGNOSIS — Z0000 Encounter for general adult medical examination without abnormal findings: Secondary | ICD-10-CM | POA: Diagnosis not present

## 2017-05-27 NOTE — Progress Notes (Signed)
Subjective:   Jody Blake is a 84 y.o. female who presents for Medicare Annual (Subsequent) preventive examination at [facility]-04/05/2016       Objective:     Vitals: BP 138/78 (BP Location: Right Arm, Patient Position: Sitting)   Pulse 83   Temp (!) 97.1 F (36.2 C) (Oral)   Ht 5\' 4"  (1.626 m)   Wt 137 lb (62.1 kg)   LMP 04/08/1974   SpO2 98%   BMI 23.52 kg/m   Body mass index is 23.52 kg/m.  Advanced Directives 05/27/2017 03/05/2017 11/20/2016 11/14/2016 10/30/2016 07/31/2016 05/08/2016  Does Patient Have a Medical Advance Directive? Yes Yes - - Yes Yes Yes  Type of Advance Directive [place];Out of facility DNR (pink MOST or yellow form) [place];Out of facility DNR (pink MOST or yellow form) Out of facility DNR (pink MOST or yellow form);Healthcare Power of [place];Living will [place];Out of facility DNR (pink MOST or yellow form) [place];Out of facility DNR (pink MOST or yellow form) [place];Out of facility DNR (pink MOST or yellow form)  Does patient want to make changes to medical advance directive? No - Patient declined No - Patient declined - - - - No - Patient declined  Copy of [place] in Chart? Yes Yes Yes No - copy requested Yes Yes Yes  Would patient like information on creating a medical advance directive? - - - - - - -  Pre-existing out of facility DNR order (yellow form or pink MOST form) Yellow form placed in chart (order not valid for inpatient use) Yellow form placed in chart (order not valid for inpatient use) Yellow form placed in chart (order not valid for inpatient use) - Yellow form placed in chart (order not valid for inpatient use) Yellow form placed in chart (order not valid for inpatient use) Yellow form placed in chart (order not valid for inpatient use)     Tobacco Social History   Tobacco Use  Smoking Status Never Smoker  Smokeless Tobacco Never Used     Counseling given: Not Answered   Clinical Intake:  Pre-visit preparation completed: No  Pain : No/denies pain     Nutritional Risks: None Diabetes: No  How often do you need to have someone help you when you read instructions, pamphlets, or other written materials from your doctor or pharmacy?: 1 - Never What is the last grade level you completed in school?: 3 years college  Interpreter Needed?: No  Information entered by :: Tyson Dense, RN  Past Medical History:  Diagnosis Date  . Anemia   . Aortic stenosis    a. Echo 09/06/12 EF 55-60%, no WMA, G2DD, Ao valve sclerosis w/ mod stenosis, LA mildly dilated, PA pressure 93mmHg  . Asthmatic bronchitis   . Complete heart block [facility])    s/p permanent pacemaker 06/27/1999 (Battery change 06/2007 and 2016).  s/p AV nodal ablation by Dr [name] 2016.  [name] COPD (chronic obstructive pulmonary disease) (HCC)    Dr. [name]  . DDD (degenerative disc disease)   . Diastolic dysfunction    a. Echo 09/06/12 EF 55-60%, no WMA, G2DD, Ao valve sclerosis w/ mod stenosis, LA mildly dilated, PA pressure 85mmHg  . Diverticulosis   . DVT (deep vein thrombosis) in pregnancy [facility]) 1954   a. LLE  . Hypertension   . Hypothyroidism    on medication  .  Macular degeneration    Dr. [name]  . Osteoarthrosis, unspecified whether generalized or localized, other specified sites   . PAF (paroxysmal atrial fibrillation) (HCC)    Stopped flecainide, on amiodarone but still has bouts of A FIB (mostly in mornings).   . Pure hypercholesterolemia   . Staghorn calculus    Left   Past Surgical History:  Procedure Laterality Date  . APPENDECTOMY  ~ 1941  . AV NODE ABLATION  05/10/2014  . AV NODE ABLATION N/A 05/10/2014   Procedure: AV NODE ABLATION;  Surgeon: Thompson Grayer, MD;  Location: [facility] CATH LAB;  Service: Cardiovascular;  Laterality: N/A;  . BUNIONECTOMY  WITH HAMMERTOE RECONSTRUCTION Bilateral ~ 1990  . CARDIAC PACEMAKER PLACEMENT  06/27/99   Medtronic PM implanted by Dr [name]  . CARDIOVERSION N/A 12/02/2013   Procedure: CARDIOVERSION;  Surgeon: [name], MD;  Location: [facility] ENDOSCOPY;  Service: Cardiovascular;  Laterality: N/A;  . CATARACT EXTRACTION W/ INTRAOCULAR LENS  IMPLANT, BILATERAL Bilateral   . COLONOSCOPY WITH PROPOFOL N/A 04/22/2013   Procedure: COLONOSCOPY WITH PROPOFOL;  Surgeon: [name], MD;  Location: [facility] ENDOSCOPY;  Service: Endoscopy;  Laterality: N/A;  . DILATION AND CURETTAGE OF UTERUS  X 2   "when I was going thru menopause"  . ESOPHAGOGASTRODUODENOSCOPY (EGD) WITH PROPOFOL N/A 04/22/2013   Procedure: ESOPHAGOGASTRODUODENOSCOPY (EGD) WITH PROPOFOL;  Surgeon: [name], MD;  Location: [facility] ENDOSCOPY;  Service: Endoscopy;  Laterality: N/A;  . HERNIA REPAIR    . INCISIONAL HERNIA REPAIR    . INSERT / REPLACE / REMOVE PACEMAKER  06/2007   "took out the old; put in new"  . INSERT / REPLACE / REMOVE PACEMAKER  05/10/2014   MDT PPM generator change by Dr [name]  . JOINT REPLACEMENT    . PARTIAL NEPHRECTOMY Left 05/1974   stone disease  . PERMANENT PACEMAKER GENERATOR CHANGE N/A 05/10/2014   Procedure: PERMANENT PACEMAKER GENERATOR CHANGE;  Surgeon: Thompson Grayer, MD;  Location: [facility] CATH LAB;  Service: Cardiovascular;  Laterality: N/A;  . TONSILLECTOMY AND ADENOIDECTOMY  1930's  . TOTAL KNEE ARTHROPLASTY Right 2001   Family History  Problem Relation Age of Onset  . Malignant hypertension Father   . Hypertension Father   . Renal Disease Father   . Breast cancer Mother   . Heart attack Brother   . Stroke Brother    Social History   Socioeconomic History  . Marital status: Married    Spouse name: None  . Number of children: 2  . Years of education: None  . Highest education level: None  Social Needs  . Financial resource strain: Not hard at all  . Food insecurity - worry: Never true  . Food insecurity -  inability: Never true  . Transportation needs - medical: No  . Transportation needs - non-medical: No  Occupational History  . Occupation: RETIRED    Employer: RETIRED    Comment: Family [employer] business  Tobacco Use  . Smoking status: Never Smoker  . Smokeless tobacco: Never Used  Substance and Sexual Activity  . Alcohol use: Yes    Alcohol/week: 4.2 oz    Types: 7 Glasses of wine per week  . Drug use: No  . Sexual activity: Not Currently    Partners: Male  Other Topics Concern  . None  Social History Narrative   Diet? Low salt, low fat      Do you drink/eat things with caffeine? yes      Marital status?  married                   What year were you married? 1949      Do you live in a house, apartment, assisted living, condo, trailer, etc.? apartment      Is it one or more stories? 3 stories      How many persons live in your home? Just me      Do you have any pets in your home? (please list) no      Current or past profession: accounting      Do you exercise?          yes                            Type & how often? Walk, class, prescribed daily      Do you have a living will? yes      Do you have a DNR form?     yes                             If not, do you want to discuss one?      Do you have signed POA/HPOA for forms? no          Outpatient Encounter Medications as of 05/27/2017  Medication Sig  . albuterol (PROVENTIL HFA;VENTOLIN HFA) 108 (90 Base) MCG/ACT inhaler Inhale 2 puffs into the lungs every 6 (six) hours as needed for wheezing or shortness of breath.  [name] amLODipine (NORVASC) 5 MG tablet Take 1 tablet (5 mg total) by mouth daily.  [name] azelastine (ASTELIN) 0.1 % nasal spray Place 1-2 sprays into both nostrils 2 (two) times daily. Use in each nostril as directed  . Biotin 1 MG CAPS Take by mouth as directed.   . Cholecalciferol (VITAMIN D) 2000 units CAPS Take 1 capsule by mouth daily.  . Fluticasone-Salmeterol (ADVAIR) 250-50 MCG/DOSE AEPB  Inhale 1 puff into the lungs every morning.  . furosemide (LASIX) 40 MG tablet TAKE 1 TABLET BY MOUTH  DAILY  . latanoprost (XALATAN) 0.005 % ophthalmic solution Place 1 drop into both eyes at bedtime.  [name] levothyroxine (SYNTHROID, LEVOTHROID) 88 MCG tablet TAKE 1 TABLET BY MOUTH  DAILY BEFORE BREAKFAST  . losartan (COZAAR) 100 MG tablet Take One and a Half tablet (150mg ) by mouth once daily At noontime  . LUTEIN PO Take 25 mg by mouth daily.   . Magnesium 250 MG TABS Take 1 tablet by mouth daily.  . metoprolol succinate (TOPROL-XL) 50 MG 24 hr tablet Take 1 tablet (50 mg total) by mouth every evening. Take with or immediately following a meal.  . montelukast (SINGULAIR) 10 MG tablet TAKE 1 TABLET BY MOUTH  DAILY  . Multiple Vitamins-Minerals (PRESERVISION AREDS 2) CAPS Take 1 capsule by mouth daily.  . potassium chloride SA (K-DUR,KLOR-CON) 20 MEQ tablet Take 20 mEq by mouth daily.   . vitamin B-12 (CYANOCOBALAMIN) 1000 MCG tablet Take 1,000 mcg by mouth daily.  Alveda Reasons 15 MG TABS tablet TAKE 1 TABLET BY MOUTH  DAILY WITH SUPPER   No facility-administered encounter medications on file as of 05/27/2017.     Activities of Daily Living In your present state of health, do you have any difficulty performing the following activities: 05/27/2017  Hearing? Y  Vision? Y  Difficulty concentrating or making decisions? Y  Walking or climbing stairs? N  Dressing or bathing? N  Doing errands, shopping? N  Preparing Food and eating ? N  Using the Toilet? N  In the past six months, have you accidently leaked urine? N  Do you have problems with loss of bowel control? N  Managing your Medications? N  Managing your Finances? N  Housekeeping or managing your Housekeeping? N  Some recent data might be hidden    Patient Care Team: Gayland Curry, DO as PCP - General (Geriatric Medicine) Thompson Grayer, MD as Consulting Physician (Cardiology) Jerline Pain, MD as Consulting Physician  (Cardiology) Deneise Lever, MD as Consulting Physician (Pulmonary Disease) Rutherford Guys, MD as Consulting Physician (Ophthalmology) [name], MD as Consulting Physician (Gastroenterology) Gaynelle Arabian, MD as Consulting Physician (Orthopedic Surgery)    Assessment:   This is a routine wellness examination for Jeaneen.  Exercise Activities and Dietary recommendations Current Exercise Habits: Home exercise routine, Type of exercise: walking, Time (Minutes): 30, Frequency (Times/Week): 7, Weekly Exercise (Minutes/Week): 210, Intensity: Mild, Exercise limited by: None identified  Goals    None      Fall Risk Fall Risk  05/27/2017 03/05/2017 10/30/2016 07/31/2016 05/08/2016  Falls in the past year? No No No No No   Is the patient's home free of loose throw rugs in walkways, pet beds, electrical cords, etc?   yes      Grab bars in the bathroom? yes      Handrails on the stairs?   yes      Adequate lighting?   yes   Depression Screen PHQ 2/9 Scores 05/27/2017 03/05/2017 10/30/2016 07/31/2016  PHQ - 2 Score 0 0 0 0     Cognitive Function MMSE - Mini Mental State Exam 05/27/2017 03/26/2016  Orientation to time 5 5  Orientation to Place 5 5  Registration 3 3  Attention/ Calculation 5 5  Recall 3 3  Language- name 2 objects 2 2  Language- repeat 1 1  Language- follow 3 step command 3 3  Language- read & follow direction 1 1  Write a sentence 1 1  Copy design 0 1  Total score 29 30        Immunization History  Administered Date(s) Administered  . Influenza Split 01/07/2011  . Influenza Whole 01/16/2009, 01/23/2010  . Influenza, High Dose Seasonal PF 01/06/2013  . Influenza,inj,Quad PF,6+ Mos 02/17/2015  . Influenza-Unspecified 01/06/2014, 02/01/2016, 01/27/2017  . Pneumococcal-Unspecified 11/23/2013  . Zoster Recombinat (Shingrix) 03/13/2017, 05/20/2017    Qualifies for Shingles Vaccine? No, up to date  Screening Tests Health Maintenance  Topic Date Due  .  TETANUS/TDAP  02/15/1945  . DEXA SCAN  02/14/1993  . PNA vac Low Risk Adult (2 of 2 - PCV13) 11/24/2014  . INFLUENZA VACCINE  Completed    Cancer Screenings: Lung: Low Dose CT Chest recommended if Age 91-80 years, 30 pack-year currently smoking OR have quit w/in 15years. Patient does not qualify. Breast:  Up to date on Mammogram? Yes   Up to date of Bone Density/Dexa? Yes Colorectal: up to date  Additional Screenings:  Hepatitis B/HIV/Syphillis: not indicated Hepatitis C Screening: declined PNA: Pt is going to check with insurance to see which one she got previously     Plan:    I have personally reviewed and addressed the Medicare Annual Wellness questionnaire and have noted the following in the patient's chart:  A. Medical and social history B. Use of alcohol, tobacco or illicit drugs  C. Current medications and supplements D. Functional  ability and status E.  Nutritional status F.  Physical activity G. Advance directives H. List of other physicians I.  Hospitalizations, surgeries, and ER visits in previous 12 months J.  [place] to include hearing, vision, cognitive, depression L. Referrals and appointments - none  In addition, I have reviewed and discussed with patient certain preventive protocols, quality metrics, and best practice recommendations. A written personalized care plan for preventive services as well as general preventive health recommendations were provided to patient.  See attached scanned questionnaire for additional information.   Signed,   Tyson Dense, RN Nurse Health Advisor  Patient Concerns: None

## 2017-05-27 NOTE — Patient Instructions (Signed)
Jody Blake , Thank you for taking time to come for your Medicare Wellness Visit. I appreciate your ongoing commitment to your health goals. Please review the following plan we discussed and let me know if I can assist you in the future.   Screening recommendations/referrals: Colonoscopy excluded, you are over age 82 Mammogram excluded, you are over age 29 Bone Density up to date Recommended yearly ophthalmology/optometry visit for glaucoma screening and checkup Recommended yearly dental visit for hygiene and checkup  Vaccinations: Influenza vaccine up to date, due 2019 fall season Pneumococcal vaccine due. Please check with insurance if you got the pneumovax or prevnar shot on 11/23/2013 Tdap vaccine due, declined for now Shingles vaccine up to date    Advanced directives: in chart  Conditions/risks identified: none  Next appointment: Dr. Mariea Clonts 07/09/2017 @ 9am   Preventive Care 65 Years and Older, Female Preventive care refers to lifestyle choices and visits with your health care provider that can promote health and wellness. What does preventive care include?  A yearly physical exam. This is also called an annual well check.  Dental exams once or twice a year.  Routine eye exams. Ask your health care provider how often you should have your eyes checked.  Personal lifestyle choices, including:  Daily care of your teeth and gums.  Regular physical activity.  Eating a healthy diet.  Avoiding tobacco and drug use.  Limiting alcohol use.  Practicing safe sex.  Taking low-dose aspirin every day.  Taking vitamin and mineral supplements as recommended by your health care provider. What happens during an annual well check? The services and screenings done by your health care provider during your annual well check will depend on your age, overall health, lifestyle risk factors, and family history of disease. Counseling  Your health care provider may ask you questions about  your:  Alcohol use.  Tobacco use.  Drug use.  Emotional well-being.  Home and relationship well-being.  Sexual activity.  Eating habits.  History of falls.  Memory and ability to understand (cognition).  Work and work Statistician.  Reproductive health. Screening  You may have the following tests or measurements:  Height, weight, and BMI.  Blood pressure.  Lipid and cholesterol levels. These may be checked every 5 years, or more frequently if you are over 76 years old.  Skin check.  Lung cancer screening. You may have this screening every year starting at age 14 if you have a 30-pack-year history of smoking and currently smoke or have quit within the past 15 years.  Fecal occult blood test (FOBT) of the stool. You may have this test every year starting at age 38.  Flexible sigmoidoscopy or colonoscopy. You may have a sigmoidoscopy every 5 years or a colonoscopy every 10 years starting at age 68.  Hepatitis C blood test.  Hepatitis B blood test.  Sexually transmitted disease (STD) testing.  Diabetes screening. This is done by checking your blood sugar (glucose) after you have not eaten for a while (fasting). You may have this done every 1-3 years.  Bone density scan. This is done to screen for osteoporosis. You may have this done starting at age 65.  Mammogram. This may be done every 1-2 years. Talk to your health care provider about how often you should have regular mammograms. Talk with your health care provider about your test results, treatment options, and if necessary, the need for more tests. Vaccines  Your health care provider may recommend certain vaccines, such as:  Influenza vaccine. This is recommended every year.  Tetanus, diphtheria, and acellular pertussis (Tdap, Td) vaccine. You may need a Td booster every 10 years.  Zoster vaccine. You may need this after age 73.  Pneumococcal 13-valent conjugate (PCV13) vaccine. One dose is recommended  after age 68.  Pneumococcal polysaccharide (PPSV23) vaccine. One dose is recommended after age 75. Talk to your health care provider about which screenings and vaccines you need and how often you need them. This information is not intended to replace advice given to you by your health care provider. Make sure you discuss any questions you have with your health care provider. Document Released: 04/21/2015 Document Revised: 12/13/2015 Document Reviewed: 01/24/2015 Elsevier Interactive Patient Education  2017 [place] Prevention in the Home Falls can cause injuries. They can happen to people of all ages. There are many things you can do to make your home safe and to help prevent falls. What can I do on the outside of my home?  Regularly fix the edges of walkways and driveways and fix any cracks.  Remove anything that might make you trip as you walk through a door, such as a raised step or threshold.  Trim any bushes or trees on the path to your home.  Use bright outdoor lighting.  Clear any walking paths of anything that might make someone trip, such as rocks or tools.  Regularly check to see if handrails are loose or broken. Make sure that both sides of any steps have handrails.  Any raised decks and porches should have guardrails on the edges.  Have any leaves, snow, or ice cleared regularly.  Use sand or salt on walking paths during winter.  Clean up any spills in your garage right away. This includes oil or grease spills. What can I do in the bathroom?  Use night lights.  Install grab bars by the toilet and in the tub and shower. Do not use towel bars as grab bars.  Use non-skid mats or decals in the tub or shower.  If you need to sit down in the shower, use a plastic, non-slip stool.  Keep the floor dry. Clean up any water that spills on the floor as soon as it happens.  Remove soap buildup in the tub or shower regularly.  Attach bath mats securely with  double-sided non-slip rug tape.  Do not have throw rugs and other things on the floor that can make you trip. What can I do in the bedroom?  Use night lights.  Make sure that you have a light by your bed that is easy to reach.  Do not use any sheets or blankets that are too big for your bed. They should not hang down onto the floor.  Have a firm chair that has side arms. You can use this for support while you get dressed.  Do not have throw rugs and other things on the floor that can make you trip. What can I do in the kitchen?  Clean up any spills right away.  Avoid walking on wet floors.  Keep items that you use a lot in easy-to-reach places.  If you need to reach something above you, use a strong step stool that has a grab bar.  Keep electrical cords out of the way.  Do not use floor polish or wax that makes floors slippery. If you must use wax, use non-skid floor wax.  Do not have throw rugs and other things on the floor that  can make you trip. What can I do with my stairs?  Do not leave any items on the stairs.  Make sure that there are handrails on both sides of the stairs and use them. Fix handrails that are broken or loose. Make sure that handrails are as long as the stairways.  Check any carpeting to make sure that it is firmly attached to the stairs. Fix any carpet that is loose or worn.  Avoid having throw rugs at the top or bottom of the stairs. If you do have throw rugs, attach them to the floor with carpet tape.  Make sure that you have a light switch at the top of the stairs and the bottom of the stairs. If you do not have them, ask someone to add them for you. What else can I do to help prevent falls?  Wear shoes that:  Do not have high heels.  Have rubber bottoms.  Are comfortable and fit you well.  Are closed at the toe. Do not wear sandals.  If you use a stepladder:  Make sure that it is fully opened. Do not climb a closed stepladder.  Make  sure that both sides of the stepladder are locked into place.  Ask someone to hold it for you, if possible.  Clearly mark and make sure that you can see:  Any grab bars or handrails.  First and last steps.  Where the edge of each step is.  Use tools that help you move around (mobility aids) if they are needed. These include:  Canes.  Walkers.  Scooters.  Crutches.  Turn on the lights when you go into a dark area. Replace any light bulbs as soon as they burn out.  Set up your furniture so you have a clear path. Avoid moving your furniture around.  If any of your floors are uneven, fix them.  If there are any pets around you, be aware of where they are.  Review your medicines with your doctor. Some medicines can make you feel dizzy. This can increase your chance of falling. Ask your doctor what other things that you can do to help prevent falls. This information is not intended to replace advice given to you by your health care provider. Make sure you discuss any questions you have with your health care provider. Document Released: 01/19/2009 Document Revised: 08/31/2015 Document Reviewed: 04/29/2014 Elsevier Interactive Patient Education  2017 [employer].

## 2017-06-02 ENCOUNTER — Ambulatory Visit (INDEPENDENT_AMBULATORY_CARE_PROVIDER_SITE_OTHER): Payer: Medicare Other | Admitting: *Deleted

## 2017-06-02 DIAGNOSIS — I442 Atrioventricular block, complete: Secondary | ICD-10-CM

## 2017-06-02 NOTE — Progress Notes (Signed)
Remote pacemaker transmission.   

## 2017-06-03 DIAGNOSIS — D485 Neoplasm of uncertain behavior of skin: Secondary | ICD-10-CM | POA: Diagnosis not present

## 2017-06-03 DIAGNOSIS — L400 Psoriasis vulgaris: Secondary | ICD-10-CM | POA: Diagnosis not present

## 2017-06-03 DIAGNOSIS — C44729 Squamous cell carcinoma of skin of left lower limb, including hip: Secondary | ICD-10-CM | POA: Diagnosis not present

## 2017-06-03 DIAGNOSIS — Z85828 Personal history of other malignant neoplasm of skin: Secondary | ICD-10-CM | POA: Diagnosis not present

## 2017-06-03 DIAGNOSIS — D692 Other nonthrombocytopenic purpura: Secondary | ICD-10-CM | POA: Diagnosis not present

## 2017-06-05 ENCOUNTER — Encounter: Payer: Self-pay | Admitting: Cardiology

## 2017-06-10 ENCOUNTER — Ambulatory Visit: Payer: Medicare Other | Admitting: Internal Medicine

## 2017-06-17 LAB — CUP PACEART REMOTE DEVICE CHECK
Battery Voltage: 2.8 V
Date Time Interrogation Session: 20190225171430
Implantable Lead Implant Date: 20090318
Implantable Lead Location: 753860
Implantable Lead Model: 5092
Implantable Pulse Generator Implant Date: 20160202
Lead Channel Impedance Value: 67 Ohm
Lead Channel Pacing Threshold Amplitude: 1.125 V
Lead Channel Pacing Threshold Pulse Width: 0.4 ms
Lead Channel Setting Pacing Amplitude: 2.5 V
MDC IDC LEAD IMPLANT DT: 20090318
MDC IDC LEAD LOCATION: 753859
MDC IDC MSMT BATTERY IMPEDANCE: 135 Ohm
MDC IDC MSMT BATTERY REMAINING LONGEVITY: 141 mo
MDC IDC MSMT LEADCHNL RV IMPEDANCE VALUE: 684 Ohm
MDC IDC SET LEADCHNL RV PACING PULSEWIDTH: 0.4 ms
MDC IDC SET LEADCHNL RV SENSING SENSITIVITY: 4 mV
MDC IDC STAT BRADY RV PERCENT PACED: 99 %

## 2017-06-24 ENCOUNTER — Encounter (HOSPITAL_COMMUNITY): Payer: Self-pay

## 2017-06-24 ENCOUNTER — Observation Stay (HOSPITAL_COMMUNITY)
Admission: EM | Admit: 2017-06-24 | Discharge: 2017-06-26 | Disposition: A | Discharge status: Home / Self Care | Payer: Medicare Other | Attending: Internal Medicine | Admitting: Internal Medicine

## 2017-06-24 ENCOUNTER — Emergency Department (HOSPITAL_COMMUNITY): Payer: Medicare Other

## 2017-06-24 DIAGNOSIS — I495 Sick sinus syndrome: Secondary | ICD-10-CM | POA: Diagnosis present

## 2017-06-24 DIAGNOSIS — E039 Hypothyroidism, unspecified: Secondary | ICD-10-CM | POA: Insufficient documentation

## 2017-06-24 DIAGNOSIS — Z8673 Personal history of transient ischemic attack (TIA), and cerebral infarction without residual deficits: Secondary | ICD-10-CM | POA: Insufficient documentation

## 2017-06-24 DIAGNOSIS — Z7901 Long term (current) use of anticoagulants: Secondary | ICD-10-CM | POA: Diagnosis not present

## 2017-06-24 DIAGNOSIS — Z950 Presence of cardiac pacemaker: Secondary | ICD-10-CM | POA: Insufficient documentation

## 2017-06-24 DIAGNOSIS — J449 Chronic obstructive pulmonary disease, unspecified: Secondary | ICD-10-CM | POA: Diagnosis not present

## 2017-06-24 DIAGNOSIS — I6789 Other cerebrovascular disease: Secondary | ICD-10-CM | POA: Diagnosis not present

## 2017-06-24 DIAGNOSIS — I10 Essential (primary) hypertension: Secondary | ICD-10-CM | POA: Diagnosis not present

## 2017-06-24 DIAGNOSIS — I639 Cerebral infarction, unspecified: Secondary | ICD-10-CM

## 2017-06-24 DIAGNOSIS — G459 Transient cerebral ischemic attack, unspecified: Secondary | ICD-10-CM | POA: Diagnosis not present

## 2017-06-24 DIAGNOSIS — R4781 Slurred speech: Secondary | ICD-10-CM | POA: Diagnosis not present

## 2017-06-24 DIAGNOSIS — R29810 Facial weakness: Secondary | ICD-10-CM | POA: Diagnosis present

## 2017-06-24 DIAGNOSIS — Z79899 Other long term (current) drug therapy: Secondary | ICD-10-CM | POA: Diagnosis not present

## 2017-06-24 DIAGNOSIS — I4821 Permanent atrial fibrillation: Secondary | ICD-10-CM | POA: Diagnosis present

## 2017-06-24 LAB — COMPREHENSIVE METABOLIC PANEL
ALBUMIN: 3.8 g/dL (ref 3.5–5.0)
ALT: 17 U/L (ref 14–54)
ANION GAP: 10 (ref 5–15)
AST: 34 U/L (ref 15–41)
Alkaline Phosphatase: 67 U/L (ref 38–126)
BILIRUBIN TOTAL: 0.4 mg/dL (ref 0.3–1.2)
BUN: 34 mg/dL — ABNORMAL HIGH (ref 6–20)
CHLORIDE: 103 mmol/L (ref 101–111)
CO2: 24 mmol/L (ref 22–32)
Calcium: 9.5 mg/dL (ref 8.9–10.3)
Creatinine, Ser: 1.22 mg/dL — ABNORMAL HIGH (ref 0.44–1.00)
GFR calc Af Amer: 44 mL/min — ABNORMAL LOW (ref >60)
GFR, EST NON AFRICAN AMERICAN: 38 mL/min — AB (ref >60)
GLUCOSE: 129 mg/dL — AB (ref 65–99)
POTASSIUM: 3.7 mmol/L (ref 3.5–5.1)
Sodium: 137 mmol/L (ref 135–145)
TOTAL PROTEIN: 6.5 g/dL (ref 6.5–8.1)

## 2017-06-24 LAB — I-STAT CHEM 8, ED
BUN: 36 mg/dL — AB (ref 6–20)
CREATININE: 1.2 mg/dL — AB (ref 0.44–1.00)
Calcium, Ion: 1.24 mmol/L (ref 1.15–1.40)
Chloride: 103 mmol/L (ref 101–111)
GLUCOSE: 129 mg/dL — AB (ref 65–99)
HCT: 40 % (ref 36.0–46.0)
HEMOGLOBIN: 13.6 g/dL (ref 12.0–15.0)
Potassium: 3.5 mmol/L (ref 3.5–5.1)
Sodium: 140 mmol/L (ref 135–145)
TCO2: 25 mmol/L (ref 22–32)

## 2017-06-24 LAB — CBC
HCT: 40.2 % (ref 36.0–46.0)
HEMOGLOBIN: 12.9 g/dL (ref 12.0–15.0)
MCH: 31.8 pg (ref 26.0–34.0)
MCHC: 32.1 g/dL (ref 30.0–36.0)
MCV: 99 fL (ref 78.0–100.0)
Platelets: 264 10*3/uL (ref 150–400)
RBC: 4.06 MIL/uL (ref 3.87–5.11)
RDW: 14.4 % (ref 11.5–15.5)
WBC: 6.5 10*3/uL (ref 4.0–10.5)

## 2017-06-24 LAB — DIFFERENTIAL
BASOS PCT: 1 %
Basophils Absolute: 0 10*3/uL (ref 0.0–0.1)
EOS ABS: 0.2 10*3/uL (ref 0.0–0.7)
EOS PCT: 3 %
LYMPHS ABS: 1.7 10*3/uL (ref 0.7–4.0)
Lymphocytes Relative: 26 %
Monocytes Absolute: 0.5 10*3/uL (ref 0.1–1.0)
Monocytes Relative: 7 %
NEUTROS PCT: 63 %
Neutro Abs: 4.1 10*3/uL (ref 1.7–7.7)

## 2017-06-24 LAB — PROTIME-INR
INR: 1.06
Prothrombin Time: 13.8 seconds (ref 11.4–15.2)

## 2017-06-24 LAB — I-STAT TROPONIN, ED: TROPONIN I, POC: 0 ng/mL (ref 0.00–0.08)

## 2017-06-24 LAB — APTT: APTT: 27 s (ref 24–36)

## 2017-06-24 NOTE — ED Triage Notes (Signed)
Pt presents with onset of slurred speech, L sided facial droop and L sided weakness noted by staff and friends at [facility] assisted [facility].  EMS reports symptoms lasted 10 minutes with resolution of symptoms.  GFD noted same symptoms again x 4-5 minutes with resolutions of symptoms again.

## 2017-06-24 NOTE — ED Notes (Signed)
Pt returned from CT °

## 2017-06-24 NOTE — ED Notes (Signed)
Attempted report 

## 2017-06-24 NOTE — ED Provider Notes (Signed)
Emergency Department Provider Note   I have reviewed the triage vital signs and the nursing notes.   HISTORY  Chief Complaint Face weakness/droop  HPI Jody Blake is a 84 y.o. female with PMH of AS, complete heart block with pacemaker, COPD, PAF on Xarelto, and HTN presents to the emergency department for evaluation of left face weakness.  Patient was eating dinner at her assisted living facility when the person she was eating with noticed her left face drooping.  EMS was called but symptoms resolved within 10 minutes.  The fire department saw a second episode of left face droop which also quickly resolved prior to paramedic arrival.  The patient denies any vision changes, speech changes, difficulty swallowing.  She denies any numbness, tingling, weakness in the extremities.  She does have some bilateral hand numbness that has been progressively worsening over a Janaisa Birkland period of time but not acutely worsening tonight.  No prior history of stroke.  The patient has been compliant with her medications.  She denies any head injuries.   Past Medical History:  Diagnosis Date  . Anemia   . Aortic stenosis    a. Echo 09/06/12 EF 55-60%, no WMA, G2DD, Ao valve sclerosis w/ mod stenosis, LA mildly dilated, PA pressure 63mmHg  . Asthmatic bronchitis   . Complete heart block [facility])    s/p permanent pacemaker 06/27/1999 (Battery change 06/2007 and 2016).  s/p AV nodal ablation by Dr [name] 2016.  [name] COPD (chronic obstructive pulmonary disease) (HCC)    Dr. [name]  . DDD (degenerative disc disease)   . Diastolic dysfunction    a. Echo 09/06/12 EF 55-60%, no WMA, G2DD, Ao valve sclerosis w/ mod stenosis, LA mildly dilated, PA pressure 17mmHg  . Diverticulosis   . DVT (deep vein thrombosis) in pregnancy [facility]) 1954   a. LLE  . Hypertension   . Hypothyroidism    on medication  . Macular degeneration    Dr. [name]  . Osteoarthrosis, unspecified whether generalized or localized, other specified sites   .  PAF (paroxysmal atrial fibrillation) (HCC)    Stopped flecainide, on amiodarone but still has bouts of A FIB (mostly in mornings).   . Pure hypercholesterolemia   . Staghorn calculus    Left    Patient Active Problem List   Diagnosis Date Noted  . TIA (transient ischemic attack) 06/24/2017  . Hypercoagulable state due to atrial fibrillation ([place]) 03/05/2017  . Ataxia 11/14/2016  . DVT (deep vein thrombosis) in pregnancy ([place]) 11/14/2016  . CKD (chronic kidney disease), stage III ([place]) 11/14/2016  . Chronic diastolic congestive heart failure ([place]) 11/14/2016  . Hypertensive urgency 11/14/2016  . Hypothyroidism 11/08/2015  . Age-related macular degeneration, dry, both eyes 09/05/2015  . Bilateral ocular hypertension 09/05/2015  . Pseudophakia of both eyes 09/05/2015  . Pleural effusion on left 06/17/2015  . Complete heart block ([place]) 06/22/2014  . Sick sinus syndrome ([place]) 04/29/2014  . Acute diastolic congestive heart failure ([place]) 12/20/2013  . Tachycardia-bradycardia syndrome ([place]) 01/06/2013  . Cardiac pacemaker in situ 09/07/2012  . Saphire Barnhart term (current) use of anticoagulants 09/07/2012  . Orthostatic hypotension 09/07/2012  . Syncope 09/06/2012  . Choroidal nevus of left eye 07/26/2011  . COPD mixed type ([place]) 04/26/2010  . Seasonal and perennial allergic rhinitis 10/27/2008  . Essential hypertension 04/19/2008  . Permanent atrial fibrillation ([place]) 04/19/2008  . GERD 10/29/2007  . BRONCHITIS, CHRONIC, ACUTE EXACERBATION 04/06/2007  . APPENDECTOMY, HX OF 04/06/2007    Past Surgical History:  Procedure Laterality Date  . APPENDECTOMY  ~ 1941  . AV NODE ABLATION  05/10/2014  . AV NODE ABLATION N/A 05/10/2014   Procedure: AV NODE ABLATION;  Surgeon: Thompson Grayer, MD;  Location: [facility] CATH LAB;  Service: Cardiovascular;  Laterality: N/A;  . BUNIONECTOMY WITH HAMMERTOE RECONSTRUCTION Bilateral ~ 1990  . CARDIAC PACEMAKER PLACEMENT  06/27/99   Medtronic PM implanted by Dr [name]    . CARDIOVERSION N/A 12/02/2013   Procedure: CARDIOVERSION;  Surgeon: [name], MD;  Location: [facility] ENDOSCOPY;  Service: Cardiovascular;  Laterality: N/A;  . CATARACT EXTRACTION W/ INTRAOCULAR LENS  IMPLANT, BILATERAL Bilateral   . COLONOSCOPY WITH PROPOFOL N/A 04/22/2013   Procedure: COLONOSCOPY WITH PROPOFOL;  Surgeon: [name], MD;  Location: [facility] ENDOSCOPY;  Service: Endoscopy;  Laterality: N/A;  . DILATION AND CURETTAGE OF UTERUS  X 2   "when I was going thru menopause"  . ESOPHAGOGASTRODUODENOSCOPY (EGD) WITH PROPOFOL N/A 04/22/2013   Procedure: ESOPHAGOGASTRODUODENOSCOPY (EGD) WITH PROPOFOL;  Surgeon: [name], MD;  Location: [facility] ENDOSCOPY;  Service: Endoscopy;  Laterality: N/A;  . HERNIA REPAIR    . INCISIONAL HERNIA REPAIR    . INSERT / REPLACE / REMOVE PACEMAKER  06/2007   "took out the old; put in new"  . INSERT / REPLACE / REMOVE PACEMAKER  05/10/2014   MDT PPM generator change by Dr [name]  . JOINT REPLACEMENT    . PARTIAL NEPHRECTOMY Left 05/1974   stone disease  . PERMANENT PACEMAKER GENERATOR CHANGE N/A 05/10/2014   Procedure: PERMANENT PACEMAKER GENERATOR CHANGE;  Surgeon: Thompson Grayer, MD;  Location: [facility] CATH LAB;  Service: Cardiovascular;  Laterality: N/A;  . TONSILLECTOMY AND ADENOIDECTOMY  1930's  . TOTAL KNEE ARTHROPLASTY Right 2001      Allergies Brimonidine tartrate-timolol; Penicillins; and Breo ellipta [fluticasone furoate-vilanterol]  Family History  Problem Relation Age of Onset  . Malignant hypertension Father   . Hypertension Father   . Renal Disease Father   . Breast cancer Mother   . Heart attack Brother   . Stroke Brother     Social History Social History   Tobacco Use  . Smoking status: Never Smoker  . Smokeless tobacco: Never Used  Substance Use Topics  . Alcohol use: Yes    Alcohol/week: 4.2 oz    Types: 7 Glasses of wine per week  . Drug use: No    Review of Systems  Constitutional: No fever/chills Eyes: No visual  changes. ENT: No sore throat. Cardiovascular: Denies chest pain. Respiratory: Denies shortness of breath. Gastrointestinal: No abdominal pain.  No nausea, no vomiting.  No diarrhea.  No constipation. Genitourinary: Negative for dysuria. Musculoskeletal: Negative for back pain. Skin: Negative for rash. Neurological: Negative for headaches, focal weakness or numbness. Positive left face droop (resolved).   10-point ROS otherwise negative.  ____________________________________________   PHYSICAL EXAM:  VITAL SIGNS: ED Triage Vitals  Enc Vitals Group     BP 06/24/17 2035 (!) 170/79     Pulse Rate 06/24/17 2035 61     Resp 06/24/17 2035 19     Temp 06/24/17 2035 97.8 F (36.6 C)     Temp Source 06/24/17 2035 Oral     SpO2 06/24/17 2035 97 %     Weight 06/24/17 2043 135 lb (61.2 kg)     Height 06/24/17 2043 5\' 4"  (1.626 m)   Constitutional: Alert and oriented. Well appearing and in no acute distress. Eyes: Conjunctivae are normal. PERRL. EOMI. Head: Atraumatic. Nose: No congestion/rhinnorhea.  Mouth/Throat: Mucous membranes are moist.  Oropharynx non-erythematous. Neck: No stridor.   Cardiovascular: Normal rate, regular rhythm. Good peripheral circulation. Grossly normal heart sounds.   Respiratory: Normal respiratory effort.  No retractions. Lungs CTAB. Gastrointestinal: Soft and nontender. No distention.  Musculoskeletal: No lower extremity tenderness nor edema. No gross deformities of extremities. Neurologic:  Normal speech and language. No gross focal neurologic deficits are appreciated. Normal CN exam 2-12. Normal finger-to-nose testing. No pronator drift.  Skin:  Skin is warm, dry and intact. No rash noted.  ____________________________________________   LABS (all labs ordered are listed, but only abnormal results are displayed)  Labs Reviewed  COMPREHENSIVE METABOLIC PANEL - Abnormal; Notable for the following components:      Result Value   Glucose, Bld 129 (*)     BUN 34 (*)    Creatinine, Ser 1.22 (*)    GFR calc non Af Amer 38 (*)    GFR calc Af Amer 44 (*)    All other components within normal limits  URINALYSIS, ROUTINE W REFLEX MICROSCOPIC - Abnormal; Notable for the following components:   Leukocytes, UA TRACE (*)    Squamous Epithelial / LPF 0-5 (*)    All other components within normal limits  HEMOGLOBIN A1C - Abnormal; Notable for the following components:   Hgb A1c MFr Bld 5.7 (*)    All other components within normal limits  LIPID PANEL - Abnormal; Notable for the following components:   LDL Cholesterol 124 (*)    All other components within normal limits  BASIC METABOLIC PANEL - Abnormal; Notable for the following components:   Glucose, Bld 103 (*)    BUN 21 (*)    All other components within normal limits  I-STAT CHEM 8, ED - Abnormal; Notable for the following components:   BUN 36 (*)    Creatinine, Ser 1.20 (*)    Glucose, Bld 129 (*)    All other components within normal limits  MRSA PCR SCREENING  ETHANOL  PROTIME-INR  APTT  CBC  DIFFERENTIAL  RAPID URINE DRUG SCREEN, HOSP PERFORMED  I-STAT TROPONIN, ED   ____________________________________________  EKG   EKG Interpretation  Date/Time:  Tuesday June 24 2017 21:02:13 EDT Ventricular Rate:  62 PR Interval:    QRS Duration: 158 QT Interval:  469 QTC Calculation: 477 R Axis:   -82 Text Interpretation:  Afib/flutter and ventricular-paced rhythm No further analysis attempted due to paced rhythm Confirmed by Nanda Quinton [phone]) on 06/24/2017 9:10:30 PM       ____________________________________________  RADIOLOGY  Ct Head Wo Contrast  Result Date: 06/24/2017 CLINICAL DATA:  Slurred speech and left-sided facial droop. EXAM: CT HEAD WITHOUT CONTRAST TECHNIQUE: Contiguous axial images were obtained from the base of the skull through the vertex without intravenous contrast. COMPARISON:  11/14/2016 and priors FINDINGS: Brain: Chronic stable atrophy and moderate  small vessel ischemic disease. No acute intracranial hemorrhage, midline shift or edema. No large vascular territory infarct. Hydrocephalus. Midline fourth ventricle and basal cisterns without effacement. Vascular: No hyperdense vessel sign. Skull: Intact Sinuses/Orbits: Nonacute Other: Chronic partial inferior mastoid effusion. IMPRESSION: 1. Stable atrophy with moderate degree of chronic small vessel ischemic disease. No acute intracranial abnormality identified. 2. Chronic partial opacification of the right mastoid likely representing a tiny effusion. Electronically Signed   By: Ashley Royalty M.D.   On: 06/24/2017 22:20    ____________________________________________   PROCEDURES  Procedure(s) performed:   Procedures  None ____________________________________________   INITIAL IMPRESSION / ASSESSMENT AND PLAN /  ED COURSE  Pertinent labs & imaging results that were available during my care of the patient were reviewed by me and considered in my medical decision making (see chart for details).  Patient presents to the emergency department for evaluation of left face weakness which has now resolved.  She apparently had 2 separate episodes which resolved spontaneously.  No additional neurological symptoms were appreciated by the patient.  She is neuro intact on arrival.  Plan for CT imaging of the head along with labs.  The patient will not be able to undergo MRI because of a pacemaker.   10:40 PM Patient with unremarkable labs and CT which were reviewed. Plan for admit for TIA w/u. Discussed case with Neurology Dr. Leonel Ramsay who will consult on the patient.   Discussed patient's case with Hosptialist to request admission. Patient and family (if present) updated with plan. Care transferred to Hospitalist service.  I reviewed all nursing notes, vitals, pertinent old records, EKGs, labs, imaging (as available).  ____________________________________________  FINAL CLINICAL IMPRESSION(S) /  ED DIAGNOSES  Final diagnoses:  TIA (transient ischemic attack)     MEDICATIONS GIVEN DURING THIS VISIT:  Medications  albuterol (PROVENTIL) (2.5 MG/3ML) 0.083% nebulizer solution 3 mL (not administered)  amLODipine (NORVASC) tablet 5 mg (5 mg Oral Given 06/25/17 0949)  mometasone-formoterol (DULERA) 200-5 MCG/ACT inhaler 2 puff (2 puffs Inhalation Given 06/25/17 0956)  latanoprost (XALATAN) 0.005 % ophthalmic solution 1 drop (1 drop Both Eyes Given 06/25/17 0349)  levothyroxine (SYNTHROID, LEVOTHROID) tablet 88 mcg (88 mcg Oral Given 06/25/17 0952)  magnesium oxide (MAG-OX) tablet 400 mg (400 mg Oral Given 06/25/17 0949)  metoprolol succinate (TOPROL-XL) 24 hr tablet 50 mg (not administered)  montelukast (SINGULAIR) tablet 10 mg (10 mg Oral Given 06/25/17 0948)  0.9 %  sodium chloride infusion ( Intravenous New Bag/Given 06/25/17 0350)  acetaminophen (TYLENOL) tablet 650 mg (not administered)    Or  acetaminophen (TYLENOL) solution 650 mg (not administered)    Or  acetaminophen (TYLENOL) suppository 650 mg (not administered)  Rivaroxaban (XARELTO) tablet 15 mg (15 mg Oral Given 06/25/17 0412)  hydrALAZINE (APRESOLINE) injection 10 mg (not administered)  atorvastatin (LIPITOR) tablet 40 mg (not administered)   stroke: mapping our early stages of recovery book ( Does not apply Given 06/25/17 0350)    Note:  This document was prepared using Dragon voice recognition software and may include unintentional dictation errors.  Nanda Quinton, MD Emergency Medicine    Roshon Duell, [facility], MD 06/25/17 [phone]

## 2017-06-25 ENCOUNTER — Other Ambulatory Visit (HOSPITAL_COMMUNITY): Payer: Medicare Other

## 2017-06-25 ENCOUNTER — Observation Stay (HOSPITAL_BASED_OUTPATIENT_CLINIC_OR_DEPARTMENT_OTHER): Payer: Medicare Other

## 2017-06-25 ENCOUNTER — Encounter (HOSPITAL_COMMUNITY): Payer: Self-pay | Admitting: Internal Medicine

## 2017-06-25 ENCOUNTER — Other Ambulatory Visit: Payer: Self-pay

## 2017-06-25 DIAGNOSIS — G459 Transient cerebral ischemic attack, unspecified: Secondary | ICD-10-CM

## 2017-06-25 DIAGNOSIS — I482 Chronic atrial fibrillation: Secondary | ICD-10-CM

## 2017-06-25 DIAGNOSIS — I340 Nonrheumatic mitral (valve) insufficiency: Secondary | ICD-10-CM

## 2017-06-25 DIAGNOSIS — I639 Cerebral infarction, unspecified: Secondary | ICD-10-CM

## 2017-06-25 LAB — RAPID URINE DRUG SCREEN, HOSP PERFORMED
AMPHETAMINES: NOT DETECTED
BENZODIAZEPINES: NOT DETECTED
Barbiturates: NOT DETECTED
Cocaine: NOT DETECTED
Opiates: NOT DETECTED
Tetrahydrocannabinol: NOT DETECTED

## 2017-06-25 LAB — URINALYSIS, ROUTINE W REFLEX MICROSCOPIC
Bacteria, UA: NONE SEEN
Bilirubin Urine: NEGATIVE
GLUCOSE, UA: NEGATIVE mg/dL
Hgb urine dipstick: NEGATIVE
Ketones, ur: NEGATIVE mg/dL
Nitrite: NEGATIVE
PH: 6 (ref 5.0–8.0)
Protein, ur: NEGATIVE mg/dL
Specific Gravity, Urine: 1.017 (ref 1.005–1.030)

## 2017-06-25 LAB — LIPID PANEL
Cholesterol: 198 mg/dL (ref 0–200)
HDL: 63 mg/dL (ref >40)
LDL CALC: 124 mg/dL — AB (ref 0–99)
TRIGLYCERIDES: 57 mg/dL (ref <150)
Total CHOL/HDL Ratio: 3.1 RATIO
VLDL: 11 mg/dL (ref 0–40)

## 2017-06-25 LAB — ECHOCARDIOGRAM COMPLETE
HEIGHTINCHES: 64 in
Weight: 2137.58 oz

## 2017-06-25 LAB — BASIC METABOLIC PANEL
Anion gap: 8 (ref 5–15)
BUN: 21 mg/dL — ABNORMAL HIGH (ref 6–20)
CALCIUM: 9 mg/dL (ref 8.9–10.3)
CO2: 27 mmol/L (ref 22–32)
CREATININE: 0.79 mg/dL (ref 0.44–1.00)
Chloride: 105 mmol/L (ref 101–111)
GFR calc non Af Amer: >60 mL/min (ref >60)
GLUCOSE: 103 mg/dL — AB (ref 65–99)
Potassium: 3.6 mmol/L (ref 3.5–5.1)
Sodium: 140 mmol/L (ref 135–145)

## 2017-06-25 LAB — HEMOGLOBIN A1C
Hgb A1c MFr Bld: 5.7 % — ABNORMAL HIGH (ref 4.8–5.6)
Mean Plasma Glucose: 116.89 mg/dL

## 2017-06-25 LAB — MRSA PCR SCREENING: MRSA BY PCR: NEGATIVE

## 2017-06-25 LAB — ETHANOL

## 2017-06-25 MED ORDER — CLOPIDOGREL BISULFATE 75 MG PO TABS: 75.0000 mg | ORAL_TABLET | Freq: Every day | ORAL | Status: DC

## 2017-06-25 MED ORDER — MAGNESIUM OXIDE 400 (241.3 MG) MG PO TABS
400.0000 mg | ORAL_TABLET | Freq: Every day | ORAL | Status: DC
  Administered 2017-06-25 – 2017-06-26 (×2): 400 mg via ORAL
  Filled 2017-06-25 (×2): qty 1

## 2017-06-25 MED ORDER — AMLODIPINE BESYLATE 5 MG PO TABS
5.0000 mg | ORAL_TABLET | Freq: Every day | ORAL | Status: DC
  Administered 2017-06-25 – 2017-06-26 (×2): 5 mg via ORAL
  Filled 2017-06-25 (×2): qty 1

## 2017-06-25 MED ORDER — CLOPIDOGREL BISULFATE 75 MG PO TABS: 300.0000 mg | ORAL_TABLET | Freq: Once | ORAL | Status: DC

## 2017-06-25 MED ORDER — ATORVASTATIN CALCIUM 40 MG PO TABS
40.0000 mg | ORAL_TABLET | Freq: Every day | ORAL | Status: DC
  Administered 2017-06-25: 40 mg via ORAL
  Filled 2017-06-25: qty 1

## 2017-06-25 MED ORDER — LATANOPROST 0.005 % OP SOLN
1.0000 [drp] | Freq: Every day | OPHTHALMIC | Status: DC
  Administered 2017-06-25 (×2): 1 [drp] via OPHTHALMIC
  Filled 2017-06-25: qty 2.5

## 2017-06-25 MED ORDER — MOMETASONE FURO-FORMOTEROL FUM 200-5 MCG/ACT IN AERO
2.0000 | INHALATION_SPRAY | Freq: Two times a day (BID) | RESPIRATORY_TRACT | Status: DC
  Administered 2017-06-25 – 2017-06-26 (×3): 2 via RESPIRATORY_TRACT
  Filled 2017-06-25: qty 8.8

## 2017-06-25 MED ORDER — LEVOTHYROXINE SODIUM 88 MCG PO TABS
88.0000 ug | ORAL_TABLET | Freq: Every day | ORAL | Status: DC
  Administered 2017-06-25 – 2017-06-26 (×2): 88 ug via ORAL
  Filled 2017-06-25 (×2): qty 1

## 2017-06-25 MED ORDER — MONTELUKAST SODIUM 10 MG PO TABS
10.0000 mg | ORAL_TABLET | Freq: Every day | ORAL | Status: DC
  Administered 2017-06-25 – 2017-06-26 (×2): 10 mg via ORAL
  Filled 2017-06-25 (×2): qty 1

## 2017-06-25 MED ORDER — STROKE: EARLY STAGES OF RECOVERY BOOK
Freq: Once | Status: AC
  Administered 2017-06-25: 04:00:00

## 2017-06-25 MED ORDER — METOPROLOL SUCCINATE ER 25 MG PO TB24
50.0000 mg | ORAL_TABLET | Freq: Every evening | ORAL | Status: DC
  Administered 2017-06-25: 50 mg via ORAL
  Filled 2017-06-25: qty 2

## 2017-06-25 MED ORDER — POLYETHYLENE GLYCOL 3350 17 G PO PACK
17.0000 g | PACK | Freq: Every day | ORAL | Status: DC
  Administered 2017-06-25: 17 g via ORAL
  Filled 2017-06-25: qty 1

## 2017-06-25 MED ORDER — HYDRALAZINE HCL 20 MG/ML IJ SOLN: 10.0000 mg | INTRAMUSCULAR | Status: DC | PRN

## 2017-06-25 MED ORDER — ACETAMINOPHEN 160 MG/5ML PO SOLN: 650.0000 mg | ORAL | Status: DC | PRN

## 2017-06-25 MED ORDER — ACETAMINOPHEN 650 MG RE SUPP: 650.0000 mg | RECTAL | Status: DC | PRN

## 2017-06-25 MED ORDER — ALBUTEROL SULFATE (2.5 MG/3ML) 0.083% IN NEBU: 3.0000 mL | INHALATION_SOLUTION | Freq: Four times a day (QID) | RESPIRATORY_TRACT | Status: DC | PRN

## 2017-06-25 MED ORDER — RIVAROXABAN 15 MG PO TABS: 15.0000 mg | ORAL_TABLET | Freq: Every day | ORAL | Status: DC

## 2017-06-25 MED ORDER — ACETAMINOPHEN 325 MG PO TABS: 650.0000 mg | ORAL_TABLET | ORAL | Status: DC | PRN

## 2017-06-25 MED ORDER — RIVAROXABAN 15 MG PO TABS
15.0000 mg | ORAL_TABLET | Freq: Every day | ORAL | Status: DC
  Administered 2017-06-25 (×2): 15 mg via ORAL
  Filled 2017-06-25 (×3): qty 1

## 2017-06-25 MED ORDER — SODIUM CHLORIDE 0.9 % IV SOLN
INTRAVENOUS | Status: DC
  Administered 2017-06-25: 04:00:00 via INTRAVENOUS

## 2017-06-25 NOTE — Plan of Care (Signed)
  Progressing Education: Knowledge of General Education information will improve 06/25/2017 1125 - Progressing by Harlin Heys, RN Health Behavior/Discharge Planning: Ability to manage health-related needs will improve 06/25/2017 1125 - Progressing by Harlin Heys, RN Clinical Measurements: Ability to maintain clinical measurements within normal limits will improve 06/25/2017 1125 - Progressing by Harlin Heys, RN Will remain free from infection 06/25/2017 1125 - Progressing by Harlin Heys, RN Diagnostic test results will improve 06/25/2017 1125 - Progressing by Harlin Heys, RN Respiratory complications will improve 06/25/2017 1125 - Progressing by Harlin Heys, RN Cardiovascular complication will be avoided 06/25/2017 1125 - Progressing by Harlin Heys, RN Activity: Risk for activity intolerance will decrease 06/25/2017 1125 - Progressing by Harlin Heys, RN Nutrition: Adequate nutrition will be maintained 06/25/2017 1125 - Progressing by Harlin Heys, RN Coping: Level of anxiety will decrease 06/25/2017 1125 - Progressing by Harlin Heys, RN Elimination: Will not experience complications related to bowel motility 06/25/2017 1125 - Progressing by Harlin Heys, RN Will not experience complications related to urinary retention 06/25/2017 1125 - Progressing by Harlin Heys, RN Pain Managment: General experience of comfort will improve 06/25/2017 1125 - Progressing by Harlin Heys, RN Safety: Ability to remain free from injury will improve 06/25/2017 1125 - Progressing by Harlin Heys, RN Skin Integrity: Risk for impaired skin integrity will decrease 06/25/2017 1125 - Progressing by Harlin Heys, RN Education: Knowledge of disease or condition will improve 06/25/2017 1125 - Progressing by Harlin Heys, RN Knowledge of secondary prevention will improve 06/25/2017 1125 - Progressing by Harlin Heys, RN Knowledge of patient specific risk  factors addressed and post discharge goals established will improve 06/25/2017 1125 - Progressing by Harlin Heys, RN Coping: Will verbalize positive feelings about self 06/25/2017 1125 - Progressing by Harlin Heys, RN Ischemic Stroke/TIA Tissue Perfusion: Complications of ischemic stroke/TIA will be minimized 06/25/2017 1125 - Progressing by Harlin Heys, RN

## 2017-06-25 NOTE — Care Management Note (Signed)
Case Management Note  Patient Details  Name: Jody Blake MRN: [id number] Date of Birth: 05/11/1925  Subjective/Objective:                 Spoke w patient and daughter at bedside. Patient is from [facility]. Daughter states patient has cane, but will not use it. She denies any problems with falls. Patient on xaralto pta. CSW following for return to ALF.   Action/Plan:   Expected Discharge Date:                  Expected Discharge Plan:  Assisted Living / Rest Home  In-House Referral:  Clinical Social Work  Discharge planning Services  CM Consult  Post Acute Care Choice:    Choice offered to:     DME Arranged:    DME Agency:     HH Arranged:    HH Agency:     Status of Service:  In process, will continue to follow  If discussed at Long Length of Stay Meetings, dates discussed:    Additional Comments:  Carles Collet, RN 06/25/2017, 11:33 AM

## 2017-06-25 NOTE — Progress Notes (Signed)
SLP Cancellation Note  Patient Details Name: MARICA TRENTHAM MRN: [id number] DOB: Dec 22, 1925   Cancelled treatment:       Reason Eval/Treat Not Completed: Patient at procedure or test/unavailable   Silveria Botz, Katherene Ponto 06/25/2017, 1:49 PM

## 2017-06-25 NOTE — Consult Note (Signed)
Neurology Consultation Reason for Consult: Transient left-sided weakness Referring Physician: Long, J  CC: Transient left-sided weakness  History is obtained from: Patient, chart  HPI: Jody Blake is a 84 y.o. female with a history of heart block status post pacemaker, aortic stenosis, hypertension, A. fib on Xarelto who presents with transient left-sided weakness.  This apparently occurred twice, once during dinner and then again after EMS arrived and it was witnessed by EMS.  The patient does not feel that she had any episode of loss of consciousness or amnestic episode, but she did not notice that she was having left-sided weakness during the episode.  Per her report, she never felt that anything was out of the ordinary.   LKW: Dinnertime, but she is uncertain of the exact time tpa given?: no, resolution of symptoms   ROS: A 14 point ROS was performed and is negative except as noted in the HPI.   Past Medical History:  Diagnosis Date  . Anemia   . Aortic stenosis    a. Echo 09/06/12 EF 55-60%, no WMA, G2DD, Ao valve sclerosis w/ mod stenosis, LA mildly dilated, PA pressure 69mmHg  . Asthmatic bronchitis   . Complete heart block [facility])    s/p permanent pacemaker 06/27/1999 (Battery change 06/2007 and 2016).  s/p AV nodal ablation by Dr [name] 2016.  [name] COPD (chronic obstructive pulmonary disease) (HCC)    Dr. [name]  . DDD (degenerative disc disease)   . Diastolic dysfunction    a. Echo 09/06/12 EF 55-60%, no WMA, G2DD, Ao valve sclerosis w/ mod stenosis, LA mildly dilated, PA pressure 52mmHg  . Diverticulosis   . DVT (deep vein thrombosis) in pregnancy [facility]) 1954   a. LLE  . Hypertension   . Hypothyroidism    on medication  . Macular degeneration    Dr. [name]  . Osteoarthrosis, unspecified whether generalized or localized, other specified sites   . PAF (paroxysmal atrial fibrillation) (HCC)    Stopped flecainide, on amiodarone but still has bouts of A FIB (mostly in mornings).    . Pure hypercholesterolemia   . Staghorn calculus    Left     Family History  Problem Relation Age of Onset  . Malignant hypertension Father   . Hypertension Father   . Renal Disease Father   . Breast cancer Mother   . Heart attack Brother   . Stroke Brother      Social History:  reports that  has never smoked. she has never used smokeless tobacco. She reports that she drinks about 4.2 oz of alcohol per week. She reports that she does not use drugs.   Exam: Current vital signs: BP (!) 165/78 (BP Location: Right Arm)   Pulse 64   Temp 98 F (36.7 C) (Oral)   Resp 18   Ht 5\' 4"  (1.626 m)   Wt 60.6 kg (133 lb 9.6 oz)   LMP 04/08/1974   SpO2 97%   BMI 22.93 kg/m  Vital signs in last 24 hours: Temp:  [97.8 F (36.6 C)-98 F (36.7 C)] 98 F (36.7 C) (03/20 0038) Pulse Rate:  [59-64] 64 (03/20 0038) Resp:  [16-19] 18 (03/20 0038) BP: (156-170)/(73-84) 165/78 (03/20 0038) SpO2:  [94 %-97 %] 97 % (03/20 0038) Weight:  [60.6 kg (133 lb 9.6 oz)-61.2 kg (135 lb)] 60.6 kg (133 lb 9.6 oz) (03/20 0038)   Physical Exam  Constitutional: Appears well-developed and well-nourished.  Psych: Affect appropriate to situation Eyes: No scleral injection HENT: No OP  obstrucion Head: Normocephalic.  Cardiovascular: Normal rate and regular rhythm.  Respiratory: Effort normal, non-labored breathing GI: Soft.  No distension. There is no tenderness.  Skin: WDI  Neuro: Mental Status: Patient is awake, alert, oriented to person, place, month, year, and situation. Patient is able to give a clear and coherent history. No signs of aphasia or neglect Cranial Nerves: II: Visual Fields are full. Pupils are equal, round, and reactive to light.   III,IV, VI: EOMI without ptosis or diploplia.  V: Facial sensation is symmetric to temperature VII: Facial movement is symmetric.  VIII: hearing is intact to voice X: Uvula elevates symmetrically XI: Shoulder shrug is symmetric. XII: tongue is  midline without atrophy or fasciculations.  Motor: Tone is normal. Bulk is normal. 5/5 strength was present in all four extremities.  Sensory: Sensation is symmetric to light touch and temperature in the arms and legs. Cerebellar: FNF are intact bilaterally   I have reviewed labs in epic and the results pertinent to this consultation are: Mild AK I with creatinine 1.2 when she is 0.77 at baseline  I have reviewed the images obtained: CT head-unremarkable  Impression: 84 year old female with a history of atrial fibrillation on low-dose Xarelto(15mg  daily due to age per last cardiology note) who presents with transient left-sided weakness.  The fact that she was unaware of her symptoms is suggestive of neglect.  I suspect TIA as an etiology and she has been worked up for such.  Recommendations: 1. HgbA1c, fasting lipid panel 2.  CTA of the head and neck contrast, would consider overnight hydration and reassessing creatinine prior to this.   3. Frequent neuro checks 4. Echocardiogram 5.  Carotid Dopplers are not needed if CTA is obtained.`` 6. Prophylactic therapy-Xarelto 7. Risk factor modification 8. Telemetry monitoring 9. PT consult, OT consult, Speech consult 10. please page stroke NP  Or  PA  Or MD  from 8am -4 pm as this patient will be followed by the stroke team at this point.   You can look them up on [email or web address]      Roland Rack, MD [facility] [phone]  If 7pm- 7am, please page neurology on call as listed in [place].

## 2017-06-25 NOTE — Care Management Obs Status (Signed)
[place] NOTIFICATION   Patient Details  Name: GLENDINE SWETZ MRN: [id number] Date of Birth: [birth year]   Medicare Observation Status Notification Given:  Yes    Carles Collet, RN 06/25/2017, 11:19 AM

## 2017-06-25 NOTE — Progress Notes (Signed)
Patient admitted after midnight, please see H&P.  Await neuro recommendations.  Cr improved in CT head/neck with contrast is needed. Echo/carotid pending Patient has flight planned for [place] to [place]

## 2017-06-25 NOTE — CV Procedure (Signed)
Echocardiogram not completed. Patient undergoing physical therapy at this time. Will try again later.   Darlina Sicilian RDCS

## 2017-06-25 NOTE — Evaluation (Signed)
Occupational Therapy Evaluation Patient Details Name: Jody Blake MRN: [id number] DOB: 12-13-1925 Today's Date: 06/25/2017    History of Present Illness 84 year old female was admitted with L facial droop.  PMH:  pacemaker, HTN, COPD, and PAF   Clinical Impression   Pt was seen for initial evaluation; she needed min A for mobility when she got up this am and reports that she did not sleep last night at all.  Per PT, pt did better with her and is near baseline.Will follow in acute setting with the goals below:  Supervision to mod I level. Pt was independent at home    Follow Up Recommendations    Intermittent supervision   Equipment Recommendations  None recommended by OT    Recommendations for Other Services       Precautions / Restrictions Precautions Precautions: Fall Restrictions Weight Bearing Restrictions: No      Mobility Bed Mobility Overal bed mobility: Independent                Transfers Overall transfer level: Needs assistance Equipment used: 1 person hand held assist Transfers: Sit to/from Stand Sit to Stand: Min assist         General transfer comment: steadying assistance    Balance                                           ADL either performed or assessed with clinical judgement   ADL Overall ADL's : Needs assistance/impaired     Grooming: Standing;Supervision/safety                                 General ADL Comments: min assist (steadying) hand held assist to gather items.  Pt reports she didn't sleep at all last night     Vision Baseline Vision/History: No visual deficits       Perception     Praxis      Pertinent Vitals/Pain Pain Assessment: No/denies pain     Hand Dominance     Extremity/Trunk Assessment Upper Extremity Assessment Upper Extremity Assessment: RUE deficits/detail RUE Deficits / Details: R shoulder soreness; able to lift to 80 RUE Coordination: (difficulty with  hearing aide; 2 fingers bandaged)           Communication Communication Communication: No difficulties   Cognition Arousal/Alertness: Awake/alert Behavior During Therapy: WFL for tasks assessed/performed Overall Cognitive Status: Within Functional Limits for tasks assessed                                     General Comments       Exercises     Shoulder Instructions      Home Living Family/patient expects to be discharged to:: Unsure                                 Additional Comments: pt is from [facility].        Prior Functioning/Environment Level of Independence: Independent                 OT Problem List: Decreased activity tolerance;Impaired balance (sitting and/or standing)      OT Treatment/Interventions: Self-care/ADL training;Energy conservation;DME  and/or AE instruction;Patient/family education;Balance training    OT Goals(Current goals can be found in the care plan section) Acute Rehab OT Goals Patient Stated Goal: return to independence OT Goal Formulation: With patient Time For Goal Achievement: 07/09/17 Potential to Achieve Goals: Good ADL Goals Pt Will Transfer to Toilet: with modified independence;ambulating(high commode) Additional ADL Goal #1: pt will gather items for adl and perform at mod I level  OT Frequency: Min 2X/week   Barriers to D/C:            Co-evaluation              AM-PAC PT "6 Clicks" Daily Activity     Outcome Measure Help from another person eating meals?: None Help from another person taking care of personal grooming?: A Little Help from another person toileting, which includes using toliet, bedpan, or urinal?: A Little Help from another person bathing (including washing, rinsing, drying)?: A Little Help from another person to put on and taking off regular upper body clothing?: A Little Help from another person to put on and taking off regular lower body  clothing?: A Little 6 Click Score: 19   End of Session    Activity Tolerance: Patient tolerated treatment well Patient left: in bed;with call bell/phone within reach  OT Visit Diagnosis: Unsteadiness on feet (R26.81)                Time: 6226-3335 OT Time Calculation (min): 21 min Charges:  OT General Charges $OT Visit: 1 Visit OT Evaluation $OT Eval Low Complexity: 1 Low G-Codes:     [place], OTR/L [phone] 06/25/2017  Jayce Kainz 06/25/2017, 11:17 AM

## 2017-06-25 NOTE — Evaluation (Addendum)
Physical Therapy Evaluation Patient Details Name: Jody Blake MRN: [id number] DOB: 1926-03-18 Today's Date: 06/25/2017   History of Present Illness  84 year old female was admitted with L facial droop.  CT scan was negative for acute event and pt cannot have an MRI due to pacemaker.  Suspected TIA.  PMH:  pacemaker, HTN, COPD, and PAF, DDD, anemia, aortic stenosis, complete heart block, macular degeneration, R TKA, PPM, AV node ablation and several cardioversions.    Clinical Impression  Pt is fatigued after not sleeping much last night.  Pt is mildly wobbly on her feet, but does not require external assist to recover balance when she wobbles.  She reports she does better with her walking shoes on and does not have them here at the hospital.  Per daughter she looks pretty close to her baseline.   PT to follow acutely for deficits listed below, but is not recommending f/u at discharge.  "[facility]ST" stroke education completed.    Follow Up Recommendations No PT follow up    Equipment Recommendations  None recommended by PT    Recommendations for Other Services   NA    Precautions / Restrictions Precautions Precautions: Fall Restrictions Weight Bearing Restrictions: No      Mobility  Bed Mobility Overal bed mobility: Needs Assistance Bed Mobility: Supine to Sit     Supine to sit: Min assist     General bed mobility comments: Min hand held assist to pull up to sitting.   Transfers Overall transfer level: Needs assistance Equipment used: None Transfers: Sit to/from Stand Sit to Stand: Supervision         General transfer comment: close supervision and multiple attempts to successfully get to standing from low bed.   Ambulation/Gait Ambulation/Gait assistance: Supervision Ambulation Distance (Feet): 300 Feet Assistive device: None Gait Pattern/deviations: Step-through pattern;Staggering left;Staggering right     General Gait Details: mildy staggering gait pattern, but  pt able to compensate without external assist.  She reports she walks better with her walking shoes, but does not have them with her right now.       Modified Rankin (Stroke Patients Only) Modified Rankin (Stroke Patients Only) Pre-Morbid Rankin Score: No significant disability Modified Rankin: Moderately severe disability     Balance Overall balance assessment: Needs assistance Sitting-balance support: Feet supported;No upper extremity supported Sitting balance-Leahy Scale: Good     Standing balance support: No upper extremity supported Standing balance-Leahy Scale: Good Standing balance comment: wobby, but able to catch herself unassisted, reports no significant stumbles or falls at home.  Per daughter she moves cautiously and is careful.                               Pertinent Vitals/Pain Pain Assessment: No/denies pain    Home Living Family/patient expects to be discharged to:: Private residence Living Arrangements: Spouse/significant other Available Help at Discharge: Family;Available PRN/intermittently Type of Home: Independent living facility(at [facility]) Home Access: Level entry     Home Layout: One level Home Equipment: Walker - 2 wheels;Shower seat Additional Comments: Pt reports she takes the stairs to get her mail "for exercise" holds the rail and goes up and down reciprocally.     Prior Function Level of Independence: Independent         Comments: still drives some short distances to the grocery store and drug store, eats supper at the dining hall and fixes her own breakfast and lunch.  She does her own bathing and dressing and does not use the shower seat.      Hand Dominance        Extremity/Trunk Assessment   Upper Extremity Assessment Upper Extremity Assessment: Defer to OT evaluation RUE Deficits / Details: R shoulder soreness; able to lift to 80 RUE Coordination: (difficulty with hearing aide; 2 fingers bandaged)    Lower  Extremity Assessment Lower Extremity Assessment: Overall WFL for tasks assessed(4+/5 strength equal bil, sensation and coordination intact.)    Cervical / Trunk Assessment Cervical / Trunk Assessment: Normal  Communication   Communication: No difficulties  Cognition Arousal/Alertness: Awake/alert Behavior During Therapy: WFL for tasks assessed/performed Overall Cognitive Status: Within Functional Limits for tasks assessed                                        General Comments General comments (skin integrity, edema, etc.): Pt asking me if she can fly to [facility] tomorrow on a private plane- scheduled trip with her son.  I told her to ask the stroke MD.         Assessment/Plan    PT Assessment Patient needs continued PT services  PT Problem List Decreased strength;Decreased activity tolerance;Decreased balance;Decreased mobility;Decreased knowledge of use of DME       PT Treatment Interventions DME instruction;Gait training;Stair training;Functional mobility training;Therapeutic activities;Therapeutic exercise;Balance training;Neuromuscular re-education;Patient/family education    PT Goals (Current goals can be found in the Care Plan section)  Acute Rehab PT Goals Patient Stated Goal: return to independence PT Goal Formulation: With patient/family Time For Goal Achievement: 07/09/17 Potential to Achieve Goals: Good    Frequency Min 4X/week           AM-PAC PT "6 Clicks" Daily Activity  Outcome Measure Difficulty turning over in bed (including adjusting bedclothes, sheets and blankets)?: A Little Difficulty moving from lying on back to sitting on the side of the bed? : Unable Difficulty sitting down on and standing up from a chair with arms (e.g., wheelchair, bedside commode, etc,.)?: None Help needed moving to and from a bed to chair (including a wheelchair)?: None Help needed walking in hospital room?: None Help needed climbing 3-5 steps with a railing? : A  Little 6 Click Score: 19    End of Session   Activity Tolerance: Patient tolerated treatment well Patient left: in bed;with call bell/phone within reach;with family/visitor present Nurse Communication: Mobility status PT Visit Diagnosis: Unsteadiness on feet (R26.81);Other symptoms and signs involving the nervous system (U44.034)    Time: 7425-9563 PT Time Calculation (min) (ACUTE ONLY): 26 min   Charges:           Wells Guiles B. Mehlani Blankenburg, PT, DPT [phone]   PT Evaluation $PT Eval Moderate Complexity: 1 Mod PT Treatments $Gait Training: 8-22 mins   06/25/2017, 11:57 AM

## 2017-06-25 NOTE — Progress Notes (Signed)
  Echocardiogram 2D Echocardiogram has been performed.  Jennette Dubin 06/25/2017, 2:18 PM

## 2017-06-25 NOTE — Progress Notes (Signed)
*  PRELIMINARY RESULTS* Vascular Ultrasound Carotid Duplex (Doppler) has been completed.   Findings suggest 1-39% internal carotid artery stenosis bilaterally. Unable to adequately visualize bilateral vertebral arteries.  Transcranial Doppler completed.  06/25/2017 2:06 PM Maudry Mayhew, BS, RVT, RDCS, RDMS

## 2017-06-25 NOTE — H&P (Signed)
History and Physical    Jody Blake:[id number] DOB: [birth year] DOA: 06/24/2017  PCP: Gayland Curry, DO  Patient coming from: Assisted living facility.  Chief Complaint: Left-sided facial weakness.  HPI: Jody Blake is a 84 y.o. female with history of atrial fibrillation status post ablation and pacemaker dependent, aortic stenosis, asthma, hypertension was brought to the ER after patient was noted to have left-sided facial droop.  Patient was having dinner when patient's companion noticed that patient with a left facial droop.  Patient states he might have also passed out but not sure.  Patient left facial droop lasted for only minutes and resolved.  EMS was called.  EMS also noticed left facial droop.  Patient did not have any weakness of the upper or lower extremities or any visual symptoms or difficulty swallowing or speaking.  ED Course: In the ER patient was appearing nonfocal.  CT head was unremarkable.  Given the risk factors including A. fib patient has been admitted for further TIA workup.  Neurology has been consulted.  Patient has had a pacemaker and MRI cannot be done.  Labs show acute renal failure.  Review of Systems: As per HPI, rest all negative.   Past Medical History:  Diagnosis Date  . Anemia   . Aortic stenosis    a. Echo 09/06/12 EF 55-60%, no WMA, G2DD, Ao valve sclerosis w/ mod stenosis, LA mildly dilated, PA pressure 65mmHg  . Asthmatic bronchitis   . Complete heart block [facility])    s/p permanent pacemaker 06/27/1999 (Battery change 06/2007 and 2016).  s/p AV nodal ablation by Dr [name] 2016.  [name] COPD (chronic obstructive pulmonary disease) (HCC)    Dr. [name]  . DDD (degenerative disc disease)   . Diastolic dysfunction    a. Echo 09/06/12 EF 55-60%, no WMA, G2DD, Ao valve sclerosis w/ mod stenosis, LA mildly dilated, PA pressure 4mmHg  . Diverticulosis   . DVT (deep vein thrombosis) in pregnancy [facility]) 1954   a. LLE  . Hypertension   . Hypothyroidism    on medication  . Macular degeneration    Dr. [name]  . Osteoarthrosis, unspecified whether generalized or localized, other specified sites   . PAF (paroxysmal atrial fibrillation) (HCC)    Stopped flecainide, on amiodarone but still has bouts of A FIB (mostly in mornings).   . Pure hypercholesterolemia   . Staghorn calculus    Left    Past Surgical History:  Procedure Laterality Date  . APPENDECTOMY  ~ 1941  . AV NODE ABLATION  05/10/2014  . AV NODE ABLATION N/A 05/10/2014   Procedure: AV NODE ABLATION;  Surgeon: Thompson Grayer, MD;  Location: [facility] CATH LAB;  Service: Cardiovascular;  Laterality: N/A;  . BUNIONECTOMY WITH HAMMERTOE RECONSTRUCTION Bilateral ~ 1990  . CARDIAC PACEMAKER PLACEMENT  06/27/99   Medtronic PM implanted by Dr [name]  . CARDIOVERSION N/A 12/02/2013   Procedure: CARDIOVERSION;  Surgeon: [name], MD;  Location: [facility] ENDOSCOPY;  Service: Cardiovascular;  Laterality: N/A;  . CATARACT EXTRACTION W/ INTRAOCULAR LENS  IMPLANT, BILATERAL Bilateral   . COLONOSCOPY WITH PROPOFOL N/A 04/22/2013   Procedure: COLONOSCOPY WITH PROPOFOL;  Surgeon: [name], MD;  Location: [facility] ENDOSCOPY;  Service: Endoscopy;  Laterality: N/A;  . DILATION AND CURETTAGE OF UTERUS  X 2   "when I was going thru menopause"  . ESOPHAGOGASTRODUODENOSCOPY (EGD) WITH PROPOFOL N/A 04/22/2013   Procedure: ESOPHAGOGASTRODUODENOSCOPY (EGD) WITH PROPOFOL;  Surgeon: [name], MD;  Location: [facility] ENDOSCOPY;  Service: Endoscopy;  Laterality: N/A;  . HERNIA REPAIR    . INCISIONAL HERNIA REPAIR    . INSERT / REPLACE / REMOVE PACEMAKER  06/2007   "took out the old; put in new"  . INSERT / REPLACE / REMOVE PACEMAKER  05/10/2014   MDT PPM generator change by Dr [name]  . JOINT REPLACEMENT    . PARTIAL NEPHRECTOMY Left 05/1974   stone disease  . PERMANENT PACEMAKER GENERATOR CHANGE N/A 05/10/2014   Procedure: PERMANENT PACEMAKER GENERATOR CHANGE;  Surgeon: Thompson Grayer, MD;  Location: [facility] CATH LAB;  Service:  Cardiovascular;  Laterality: N/A;  . TONSILLECTOMY AND ADENOIDECTOMY  1930's  . TOTAL KNEE ARTHROPLASTY Right 2001     reports that  has never smoked. she has never used smokeless tobacco. She reports that she drinks about 4.2 oz of alcohol per week. She reports that she does not use drugs.  Allergies  Allergen Reactions  . Brimonidine Tartrate-Timolol Other (See Comments)    REACTION: systemic malaise amigen eye drop  . Penicillins Hives  . Breo Ellipta [Fluticasone Furoate-Vilanterol] Other (See Comments)    Ran blood pressure up Increased blood pressure    Family History  Problem Relation Age of Onset  . Malignant hypertension Father   . Hypertension Father   . Renal Disease Father   . Breast cancer Mother   . Heart attack Brother   . Stroke Brother     Prior to Admission medications   Medication Sig Start Date End Date Taking? Authorizing Provider  albuterol (PROVENTIL HFA;VENTOLIN HFA) 108 (90 Base) MCG/ACT inhaler Inhale 2 puffs into the lungs every 6 (six) hours as needed for wheezing or shortness of breath. 02/01/16  Yes Young, Tarri Fuller D, MD  amLODipine (NORVASC) 5 MG tablet Take 1 tablet (5 mg total) by mouth daily. 12/03/16  Yes Reed, Tiffany L, DO  azelastine (ASTELIN) 0.1 % nasal spray Place 1-2 sprays into both nostrils 2 (two) times daily. Use in each nostril as directed Patient taking differently: Place 1-2 sprays into both nostrils at bedtime. Use in each nostril as directed 11/14/16  Yes Young, Tarri Fuller D, MD  losartan (COZAAR) 100 MG tablet Take One and a Half tablet (150mg ) by mouth once daily At noontime Patient taking differently: Take 150 mg by mouth at bedtime. Take One and a Half tablet (150mg ) by mouth once daily At noontime 12/03/16  Yes Reed, Tiffany L, DO  metoprolol succinate (TOPROL-XL) 50 MG 24 hr tablet Take 1 tablet (50 mg total) by mouth every evening. Take with or immediately following a meal. 08/26/16  Yes Allred, Jeneen Rinks, MD  potassium chloride SA  (K-DUR,KLOR-CON) 20 MEQ tablet Take 20 mEq by mouth daily.    Yes [provider]  XARELTO 15 MG TABS tablet TAKE 1 TABLET BY MOUTH  DAILY WITH SUPPER Patient taking differently: TAKE 15 mg TABLET BY MOUTH  DAILY WITH SUPPER 03/18/17  Yes Jerline Pain, MD  Biotin 1 MG CAPS Take by mouth as directed.     [provider]  Cholecalciferol (VITAMIN D) 2000 units CAPS Take 1 capsule by mouth daily.    [provider]  Fluticasone-Salmeterol (ADVAIR) 250-50 MCG/DOSE AEPB Inhale 1 puff into the lungs every morning. 04/11/17   Baird Lyons D, MD  furosemide (LASIX) 40 MG tablet TAKE 1 TABLET BY MOUTH  DAILY 04/21/17   Reed, Tiffany L, DO  latanoprost (XALATAN) 0.005 % ophthalmic solution Place 1 drop into both eyes at bedtime.    [provider]  levothyroxine (SYNTHROID, LEVOTHROID) 88 MCG tablet TAKE 1 TABLET BY MOUTH  DAILY BEFORE BREAKFAST 03/18/17   Reed, Tiffany L, DO  LUTEIN PO Take 25 mg by mouth daily.     [provider]  Magnesium 250 MG TABS Take 1 tablet by mouth daily.    [provider]  montelukast (SINGULAIR) 10 MG tablet TAKE 1 TABLET BY MOUTH  DAILY 04/21/17   Deneise Lever, MD  Multiple Vitamins-Minerals (PRESERVISION AREDS 2) CAPS Take 1 capsule by mouth daily.    [provider]  vitamin B-12 (CYANOCOBALAMIN) 1000 MCG tablet Take 1,000 mcg by mouth daily.    [provider]    Physical Exam: Vitals:   06/24/17 2300 06/24/17 2330 06/25/17 0000 06/25/17 0038  BP: (!) 158/84 (!) 156/73 (!) 164/77 (!) 165/78  Pulse: 61 (!) 59 60 64  Resp: 19 16 16 18   Temp:    98 F (36.7 C)  TempSrc:    Oral  SpO2: 94% 96% 95% 97%  Weight:    60.6 kg (133 lb 9.6 oz)  Height:    5\' 4"  (1.626 m)      Constitutional: Moderately built and nourished. Vitals:   06/24/17 2300 06/24/17 2330 06/25/17 0000 06/25/17 0038  BP: (!) 158/84 (!) 156/73 (!) 164/77 (!) 165/78  Pulse: 61 (!) 59 60 64  Resp: 19 16 16 18   Temp:     98 F (36.7 C)  TempSrc:    Oral  SpO2: 94% 96% 95% 97%  Weight:    60.6 kg (133 lb 9.6 oz)  Height:    5\' 4"  (1.626 m)   Eyes: Anicteric no pallor. ENMT: No discharge from the ears eyes nose or mouth. Neck: No mass felt.  No neck rigidity.  No JVD appreciated. Respiratory: No rhonchi or crepitations. Cardiovascular: S1-S2 heard no murmurs appreciated. Abdomen: Soft nontender bowel sounds present. Musculoskeletal: No edema.  No joint effusion. Skin: No rash. Neurologic: Alert awake oriented to time place and person.  Moves all extremities 5 x 5.  No facial asymmetry tongue is midline. Psychiatric: Appears normal.  Normal affect.   Labs on Admission: I have personally reviewed following labs and imaging studies  CBC: Recent Labs  Lab 06/24/17 2102 06/24/17 2144  WBC 6.5  --   NEUTROABS 4.1  --   HGB 12.9 13.6  HCT 40.2 40.0  MCV 99.0  --   PLT 264  --    Basic Metabolic Panel: Recent Labs  Lab 06/24/17 2102 06/24/17 2144  NA 137 140  K 3.7 3.5  CL 103 103  CO2 24  --   GLUCOSE 129* 129*  BUN 34* 36*  CREATININE 1.22* 1.20*  CALCIUM 9.5  --    GFR: Estimated Creatinine Clearance: 27.4 mL/min (A) (by C-G formula based on SCr of 1.2 mg/dL (H)). Liver Function Tests: Recent Labs  Lab 06/24/17 2102  AST 34  ALT 17  ALKPHOS 67  BILITOT 0.4  PROT 6.5  ALBUMIN 3.8   No results for input(s): LIPASE, AMYLASE in the last 168 hours. No results for input(s): AMMONIA in the last 168 hours. Coagulation Profile: Recent Labs  Lab 06/24/17 2102  INR 1.06   Cardiac Enzymes: No results for input(s): CKTOTAL, CKMB, CKMBINDEX, TROPONINI in the last 168 hours. BNP (last 3 results) No results for input(s): PROBNP in the last 8760 hours. HbA1C: No results for input(s): HGBA1C in the last 72 hours. CBG: No results for input(s): GLUCAP in the  last 168 hours. Lipid Profile: No results for input(s): CHOL, HDL, LDLCALC, TRIG, CHOLHDL, LDLDIRECT in the last 72  hours. Thyroid Function Tests: No results for input(s): TSH, T4TOTAL, FREET4, T3FREE, THYROIDAB in the last 72 hours. Anemia Panel: No results for input(s): VITAMINB12, FOLATE, FERRITIN, TIBC, IRON, RETICCTPCT in the last 72 hours. Urine analysis:    Component Value Date/Time   COLORURINE STRAW (A) 11/14/2016 2205   APPEARANCEUR CLEAR 11/14/2016 2205   LABSPEC 1.009 11/14/2016 2205   PHURINE 8.0 11/14/2016 2205   GLUCOSEU NEGATIVE 11/14/2016 2205   HGBUR NEGATIVE 11/14/2016 2205   BILIRUBINUR NEGATIVE 11/14/2016 2205   KETONESUR NEGATIVE 11/14/2016 2205   PROTEINUR 30 (A) 11/14/2016 2205   UROBILINOGEN 0.2 01/26/2015 0325   NITRITE NEGATIVE 11/14/2016 2205   LEUKOCYTESUR NEGATIVE 11/14/2016 2205   Sepsis Labs: @LABRCNTIP (procalcitonin:4,lacticidven:4) )No results found for this or any previous visit (from the past 240 hour(s)).   Radiological Exams on Admission: Ct Head Wo Contrast  Result Date: 06/24/2017 CLINICAL DATA:  Slurred speech and left-sided facial droop. EXAM: CT HEAD WITHOUT CONTRAST TECHNIQUE: Contiguous axial images were obtained from the base of the skull through the vertex without intravenous contrast. COMPARISON:  11/14/2016 and priors FINDINGS: Brain: Chronic stable atrophy and moderate small vessel ischemic disease. No acute intracranial hemorrhage, midline shift or edema. No large vascular territory infarct. Hydrocephalus. Midline fourth ventricle and basal cisterns without effacement. Vascular: No hyperdense vessel sign. Skull: Intact Sinuses/Orbits: Nonacute Other: Chronic partial inferior mastoid effusion. IMPRESSION: 1. Stable atrophy with moderate degree of chronic small vessel ischemic disease. No acute intracranial abnormality identified. 2. Chronic partial opacification of the right mastoid likely representing a tiny effusion. Electronically Signed   By: Ashley Royalty M.D.   On: 06/24/2017 22:20    EKG: Independently reviewed.  A.  fib.  Assessment/Plan Principal Problem:   TIA (transient ischemic attack) Active Problems:   Permanent atrial fibrillation (HCC)   COPD mixed type (HCC)   Tachycardia-bradycardia syndrome (HCC)   Sick sinus syndrome (HCC)   Hypothyroidism    1. TIA -discussed with neurologist Dr. Leonel Ramsay.  At this time since patient has pacemaker MRI of the brain cannot be done.  Patient will be gently hydrated and once creatinine improves please order CT angiogram of the head and neck in the morning.  Patient is already on Xarelto so aspirin not ordered.  Get physical therapy consult check hemoglobin A1c lipid panel check 2D echo.  If CT angiogram of the neck was done carotid Dopplers did not be done. 2. History of atrial fibrillation status post ablation on pacemaker -on Xarelto.  Patient is also on beta-blockers for rate control. 3. Hypertension on amlodipine and beta-blockers.  We will hold off ARB and Lasix due to acute renal failure.  PRN IV hydralazine for systolic blood pressure more than 210. 4. History of asthma/COPD on inhalers. 5. Hypothyroidism on Synthroid. 6. Acute renal failure -gently hydrate for now hold Lasix and ARB.  Follow metabolic panel.   DVT prophylaxis: Xarelto. Code Status: DNR. Family Communication: Discussed with patient. Disposition Plan: Back to facility. Consults called: Neurology. Admission status: Observation.   Rise Patience MD [facility] Pager [phone].  If 7PM-7AM, please contact night-coverage [email or web address] Password [email or web address]  06/25/2017, 1:54 AM

## 2017-06-25 NOTE — Progress Notes (Addendum)
NEUROHOSPITALISTS STROKE TEAM - DAILY PROGRESS NOTE   ADMISSION HISTORY: Jody Blake is a 84 y.o. female with a history of heart block status post pacemaker, aortic stenosis, hypertension, A. fib on Xarelto who presents with transient left-sided weakness.  This apparently occurred twice, once during dinner and then again after EMS arrived and it was witnessed by EMS.  The patient does not feel that she had any episode of loss of consciousness or amnestic episode, but she did not notice that she was having left-sided weakness during the episode.  Per her report, she never felt that anything was out of the ordinary  SUBJECTIVE (INTERVAL HISTORY) Patient sitting at the edge of bed with her daughter in attendance.  Patient is very hard of hearing, but does have normal mentation, is awake alert oriented x3 in no distress.  Patient denies chest pain, shortness of breath, fevers, chills, headache, dizziness or syncopal episodes.  She admits to improvement in left-sided weakness.   Laboratory Results  CBC:  Recent Labs  Lab 06/24/17 2102 06/24/17 2144  WBC 6.5  --   HGB 12.9 13.6  HCT 40.2 40.0  MCV 99.0  --   PLT 264  --    BMP: Recent Labs  Lab 06/24/17 2102 06/24/17 2144 06/25/17 1044  NA 137 140 140  K 3.7 3.5 3.6  CL 103 103 105  CO2 24  --  27  GLUCOSE 129* 129* 103*  BUN 34* 36* 21*  CREATININE 1.22* 1.20* 0.79  CALCIUM 9.5  --  9.0   Coagulation Studies:  Recent Labs    06/24/17 2102  APTT 27  INR 1.06   Recent Labs  Lab 06/25/17 0428  COLORURINE YELLOW  APPEARANCEUR CLEAR  LABSPEC 1.017  PHURINE 6.0  GLUCOSEU NEGATIVE  HGBUR NEGATIVE  BILIRUBINUR NEGATIVE  KETONESUR NEGATIVE  PROTEINUR NEGATIVE  NITRITE NEGATIVE  LEUKOCYTESUR TRACE*   Urine Drug Screen:     Component Value Date/Time   LABOPIA NONE DETECTED 06/25/2017 0428   COCAINSCRNUR NONE DETECTED 06/25/2017 0428   LABBENZ NONE DETECTED  06/25/2017 0428   AMPHETMU NONE DETECTED 06/25/2017 0428   THCU NONE DETECTED 06/25/2017 0428   LABBARB NONE DETECTED 06/25/2017 0428    Alcohol Level:  Recent Labs  Lab 06/25/17 0229  ETH <10    Physical Examination   Vitals:   06/25/17 0430 06/25/17 0630 06/25/17 0745 06/25/17 1206  BP: (!) 166/80  (!) 162/73 (!) 142/68  Pulse: 62  68 70  Resp: 18 18 17 17   Temp: 98 F (36.7 C) 98.1 F (36.7 C) 98.1 F (36.7 C) 98.3 F (36.8 C)  TempSrc: Oral Oral Oral Oral  SpO2: 93% 95% 98% 98%  Weight:      Height:       General - Well nourished, well developed, in no apparent distress HEENT-  Normocephalic, hard of hearing Cardiovascular - Regular rate and rhythm  Respiratory - Lungs clear bilaterally. No wheezing. Abdomen - soft and non-tender, BS normal Extremities- no edema or cyanosis  Neurological Examination  Mental Status -  Level of arousal and orientation to time, place, and person were intact. Language including expression, naming, repetition, comprehension was assessed and found intact. Attention span and concentration were normal Recent and remote memory were intact Fund of Knowledge was assessed and was intact Cranial Nerves II - XII - II - Visual field intact OU III, IV, VI - Extraocular movements intact. V - Facial sensation intact bilaterally. VII - Facial movement intact  bilaterally VIII - Hearing & vestibular intact bilaterally X - Palate elevates symmetrically XI - Chin turning & shoulder shrug intact bilaterally. XII - Tongue protrusion intact Motor Strength - The patient's strength was normal in all extremities and pronator drift was absent.  Bulk was normal and fasciculations were absent  Motor Tone - Muscle tone was assessed at the neck and appendages and was normal Reflexes - The patient's reflexes were symmetrical in all extremities and she had no pathological reflexes Sensory - Light touch was assessed and was symmetrical Coordination - The  patient had normal movements in the hands and feet with no ataxia or dysmetria.  Tremor was absent Gait and Station - deferred.  Imaging Results  Ct Head Wo Contrast 06/24/2017 IMPRESSION:  1. Stable atrophy with moderate degree of chronic small vessel ischemic disease. No acute intracranial abnormality identified.  2. Chronic partial opacification of the right mastoid likely representing a tiny effusion.  Echocardiogram:                                              PENDING B/L Carotid U/S:                                                PENDING EKG:                                                                   Afib TCD:                                                                   PENDING  IMPRESSION AND PLAN  Jody Blake is a 84 y.o. female with history of heart block status post pacemaker, aortic stenosis, hypertension, A. fib on Xarelto who presents with transient left-sided weakness.  This apparently occurred twice, once during dinner and then again after EMS arrived and it was witnessed by EMS.  1.  TIA-patient with presenting symptoms of transient left-sided weakness which has since resolved.  CT of the head showed stable atrophy with a moderate degree of chronic small vessel disease, but no acute intracranial abnormalities.  MRI was unable to be performed due to patient having pacemaker.  No CTA performed due to initial AKI on admission. We will continue to pursue full TIA/stroke workup with transcranial ultrasound as well as carotid Dopplers, echocardiogram to rule out cardioembolic source.  We will continue patient on atorvastatin 40 mg daily as well as Xarelto for secondary stroke prevention.  We will continue early rehab with physical therapy, occupational therapy and speech therapy.  Stroke Risk Factors: atrial fibrillation, hyperlipidemia and hypertension Other Stroke Risk Factors: Advanced age,   ABCD2 Score: 5   PLAN 06/25/2017  Continue Xarelto Continue  Statin Telemetry monitoring PT/OT/SLP Pending transcranial Doppler  and bilateral carotid Doppler. Pending echocardiogram Consult Case Management /MSW Ongoing aggressive stroke risk factor management Patient counseled to be compliant with her/his antithrombotic medications Patient counseled on Lifestyle modifications including, Diet, Exercise, and Stress  Follow up with Jody Blake in 6 weeks  DC home pending results of transcranial Doppler, carotid Doppler and echo.  MEDICAL ISSUES  Managed by internal medicine  A. fib, patient with pacemaker and currently on Xarelto HAS-BLED Score:3 CHAD-VASC Score:5  ASTHMA/COPD  HYPOTHYROIDSM  ACUTE RENAL FAILURE  UNCONTROLLED HYPERTENSION:  HYPERLIPIDEMIA:    Component Value Date/Time   CHOL 198 06/25/2017 0229   TRIG 57 06/25/2017 0229   HDL 63 06/25/2017 0229   CHOLHDL 3.1 06/25/2017 0229   VLDL 11 06/25/2017 0229   LDLCALC 124 (H) 06/25/2017 0229   DIABETES: Lab Results  Component Value Date   HGBA1C 5.7 (H) 06/25/2017      Hospital day # 0 VTE prophylaxis:Xarelto Diet : Diet Heart Room service appropriate? Yes; Fluid consistency: Thin Fall precautions     FAMILY UPDATES: daughter at bedside TEAM UPDATES: Geradine Girt, DO STATUS:    Discharge Information   Discharge Stroke Meds:  Please discharge patient on Xarelto (rivaroxaban) daily , ATORVASTATIN 40MG     Disposition:  Therapy Recs:               PT No recommendations Home Equipment:         PENDING  Follow up Appointments  Follow Up:  Gayland Curry, DO -PCP Follow up in 1-2 weeks   Case Management aware of need  Follow-up with neurology in 6 weeks.   Assessment & plan discussed with with attending physician and they are in agreement.    Jacob Moores NP  Stroke Neurology Team 06/25/2017, 4:24 PM  06/25/2017 ATTENDING ASSESSMENT    I have personally examined this patient, reviewed notes, independently viewed imaging  studies, participated in medical decision making and plan of care.ROS completed by me personally and pertinent positives fully documented  I have made any additions or clarifications directly to the above note. Agree with note above.  She presented with transient left sided weakness likely due to right brain TIA of cardioembolic etiology from atrial fibrillation despite taking Xarelto.  I had a long discussion with the patient and her daughter regarding lack of definitive data suggesting superiority of other newer anticoagulants over Xarelto.  Also adding aspirin has not been shown to be better but may increase the bleeding risk.  Patient and daughter agree to stay on Xarelto and not switch.  Continue ongoing stroke workup.  Check transcranial Doppler studies and carotid Dopplers.  Discussed with Dr. Eliseo Squires  Greater than 50% time during this 35-minute visit was spent on counseling and coordination of care about her TIA, atrial fibrillation and discussion about stroke prevention and treatment options and answered questions.  Antony Contras, MD Medical Director [facility] Pager: [phone] 06/25/2017 7:27 PM  To contact Stroke Provider, please refer to [email or web address] After hours, contact General Neurology

## 2017-06-26 DIAGNOSIS — G459 Transient cerebral ischemic attack, unspecified: Secondary | ICD-10-CM | POA: Diagnosis not present

## 2017-06-26 DIAGNOSIS — E039 Hypothyroidism, unspecified: Secondary | ICD-10-CM

## 2017-06-26 MED ORDER — ATORVASTATIN CALCIUM 40 MG PO TABS: 40.0000 mg | ORAL_TABLET | Freq: Every day | ORAL | 0 refills | Status: DC

## 2017-06-26 MED ORDER — AZELASTINE HCL 0.1 % NA SOLN: 1.0000 | Freq: Every day | NASAL | Status: DC

## 2017-06-26 MED ORDER — LOSARTAN POTASSIUM 100 MG PO TABS: 150.0000 mg | ORAL_TABLET | Freq: Every day | ORAL | Status: DC

## 2017-06-26 NOTE — Discharge Summary (Signed)
Physician Discharge Summary  Jody Blake:[id number] DOB: 15-Oct-1925 DOA: 06/24/2017  PCP: Gayland Curry, DO  Admit date: 06/24/2017 Discharge date: 06/26/2017   Recommendations for Outpatient Follow-Up:   1. Final reading of TCD and carotid dopplars 2. Outpatient neurology follow up   Discharge Diagnosis:   Principal Problem:   TIA (transient ischemic attack) Active Problems:   Permanent atrial fibrillation (HCC)   COPD mixed type ([place])   Tachycardia-bradycardia syndrome ([place])   Sick sinus syndrome ([place])   Hypothyroidism   Discharge disposition:  Home.:  Discharge Condition: Improved.  Diet recommendation: Low sodium, heart healthy  Wound care: None.   History of Present Illness:   Jody Blake is a 84 y.o. female with history of atrial fibrillation status post ablation and pacemaker dependent, aortic stenosis, asthma, hypertension was brought to the ER after patient was noted to have left-sided facial droop.  Patient was having dinner when patient's companion noticed that patient with a left facial droop.  Patient states he might have also passed out but not sure.  Patient left facial droop lasted for only minutes and resolved.  EMS was called.  EMS also noticed left facial droop.  Patient did not have any weakness of the upper or lower extremities or any visual symptoms or difficulty swallowing or speaking.    Hospital Course by Problem:   TIA  -patient has pacemaker MRI of the brain cannot be done.  - Patient is already on Xarelto so aspirin not ordered.  -statin added: LDL 124 -PT/OT-- no follow up -echo: - Left ventricle: The cavity size was normal. Wall thickness was   increased in a pattern of moderate LVH. Systolic function was   normal. The estimated ejection fraction was in the range of 60%   to 65%. Wall motion was normal; there were no regional wall   motion abnormalities. - Aortic valve: Moderately calcified annulus. Moderately thickened,   moderately calcified leaflets. There was mild to moderate   stenosis. Valve area (VTI): 1.02 cm^2. Valve area (Vmax): 0.94   cm^2. Valve area (Vmean): 0.9 cm^2. TCD/carotid echo pending  History of atrial fibrillation status post ablation on pacemaker -on Xarelto.  Patient is also on beta-blockers for rate control.  Hypertension on amlodipine and beta-blockers/ARB/lasix  History of asthma/COPD on inhalers.  Hypothyroidism on Synthroid.  Acute renal failure  -improved with hydration -outpatient follow up -may need to stop ACE/lasix.       Medical Consultants:    Neuro   Discharge Exam:   Vitals:   06/26/17 0424 06/26/17 0845  BP: (!) 185/94 (!) 161/66  Pulse: 61 67  Resp: 18 18  Temp: 97.8 F (36.6 C)   SpO2: 97% 99%   Vitals:   06/25/17 2007 06/25/17 2353 06/26/17 0424 06/26/17 0845  BP: (!) 175/91 (!) 177/87 (!) 185/94 (!) 161/66  Pulse: 68 74 61 67  Resp: 18 18 18 18   Temp: 97.8 F (36.6 C) 98 F (36.7 C) 97.8 F (36.6 C)   TempSrc: Oral Oral Oral   SpO2: 95% 97% 97% 99%  Weight:      Height:        Gen:  NAD- ready to go to [facility]   The results of significant diagnostics from this hospitalization (including imaging, microbiology, ancillary and laboratory) are listed below for reference.     Procedures and Diagnostic Studies:   Ct Head Wo Contrast  Result Date: 06/24/2017 CLINICAL DATA:  Slurred speech and left-sided facial droop. EXAM:  CT HEAD WITHOUT CONTRAST TECHNIQUE: Contiguous axial images were obtained from the base of the skull through the vertex without intravenous contrast. COMPARISON:  11/14/2016 and priors FINDINGS: Brain: Chronic stable atrophy and moderate small vessel ischemic disease. No acute intracranial hemorrhage, midline shift or edema. No large vascular territory infarct. Hydrocephalus. Midline fourth ventricle and basal cisterns without effacement. Vascular: No hyperdense vessel sign. Skull: Intact Sinuses/Orbits: Nonacute Other:  Chronic partial inferior mastoid effusion. IMPRESSION: 1. Stable atrophy with moderate degree of chronic small vessel ischemic disease. No acute intracranial abnormality identified. 2. Chronic partial opacification of the right mastoid likely representing a tiny effusion. Electronically Signed   By: Ashley Royalty M.D.   On: 06/24/2017 22:20     Labs:   Basic Metabolic Panel: Recent Labs  Lab 06/24/17 2102 06/24/17 2144 06/25/17 1044  NA 137 140 140  K 3.7 3.5 3.6  CL 103 103 105  CO2 24  --  27  GLUCOSE 129* 129* 103*  BUN 34* 36* 21*  CREATININE 1.22* 1.20* 0.79  CALCIUM 9.5  --  9.0   GFR Estimated Creatinine Clearance: 41.2 mL/min (by C-G formula based on SCr of 0.79 mg/dL). Liver Function Tests: Recent Labs  Lab 06/24/17 2102  AST 34  ALT 17  ALKPHOS 67  BILITOT 0.4  PROT 6.5  ALBUMIN 3.8   No results for input(s): LIPASE, AMYLASE in the last 168 hours. No results for input(s): AMMONIA in the last 168 hours. Coagulation profile Recent Labs  Lab 06/24/17 2102  INR 1.06    CBC: Recent Labs  Lab 06/24/17 2102 06/24/17 2144  WBC 6.5  --   NEUTROABS 4.1  --   HGB 12.9 13.6  HCT 40.2 40.0  MCV 99.0  --   PLT 264  --    Cardiac Enzymes: No results for input(s): CKTOTAL, CKMB, CKMBINDEX, TROPONINI in the last 168 hours. BNP: Invalid input(s): POCBNP CBG: No results for input(s): GLUCAP in the last 168 hours. D-Dimer No results for input(s): DDIMER in the last 72 hours. Hgb A1c Recent Labs    06/25/17 0229  HGBA1C 5.7*   Lipid Profile Recent Labs    06/25/17 0229  CHOL 198  HDL 63  LDLCALC 124*  TRIG 57  CHOLHDL 3.1   Thyroid function studies No results for input(s): TSH, T4TOTAL, T3FREE, THYROIDAB in the last 72 hours.  Invalid input(s): FREET3 Anemia work up No results for input(s): VITAMINB12, FOLATE, FERRITIN, TIBC, IRON, RETICCTPCT in the last 72 hours. Microbiology Recent Results (from the past 240 hour(s))  MRSA PCR Screening      Status: None   Collection Time: 06/25/17  4:28 AM  Result Value Ref Range Status   MRSA by PCR NEGATIVE NEGATIVE Final    Comment:        The GeneXpert MRSA Assay (FDA approved for NASAL specimens only), is one component of a comprehensive MRSA colonization surveillance program. It is not intended to diagnose MRSA infection nor to guide or monitor treatment for MRSA infections. Performed at [facility], [place] [address] [place],  [place] [address]      Discharge Instructions:   Discharge Instructions    Ambulatory referral to Neurology   Complete by:  As directed    An appointment is requested in approximately: 6 weeks   Diet - low sodium heart healthy   Complete by:  As directed    Increase activity slowly   Complete by:  As directed      Allergies  as of 06/26/2017      Reactions   Brimonidine Tartrate-timolol Other (See Comments)   REACTION: systemic malaise amigen eye drop   Penicillins Hives   Breo Ellipta [fluticasone Furoate-vilanterol] Other (See Comments)   Ran blood pressure up Increased blood pressure      Medication List    TAKE these medications   albuterol 108 (90 Base) MCG/ACT inhaler Commonly known as:  PROVENTIL HFA;VENTOLIN HFA Inhale 2 puffs into the lungs every 6 (six) hours as needed for wheezing or shortness of breath.   amLODipine 5 MG tablet Commonly known as:  NORVASC Take 1 tablet (5 mg total) by mouth daily.   atorvastatin 40 MG tablet Commonly known as:  LIPITOR Take 1 tablet (40 mg total) by mouth daily at 6 PM.   azelastine 0.1 % nasal spray Commonly known as:  ASTELIN Place 1-2 sprays into both nostrils at bedtime. Use in each nostril as directed What changed:  when to take this   Biotin 1 MG Caps Take 1 mg by mouth daily.   Fluticasone-Salmeterol 250-50 MCG/DOSE Aepb Commonly known as:  ADVAIR Inhale 1 puff into the lungs every morning.   furosemide 40 MG tablet Commonly known as:  LASIX TAKE 1 TABLET BY MOUTH   DAILY What changed:    how much to take  how to take this  when to take this   latanoprost 0.005 % ophthalmic solution Commonly known as:  XALATAN Place 1 drop into both eyes at bedtime.   levothyroxine 88 MCG tablet Commonly known as:  SYNTHROID, LEVOTHROID TAKE 1 TABLET BY MOUTH  DAILY BEFORE BREAKFAST What changed:  See the new instructions.   losartan 100 MG tablet Commonly known as:  COZAAR Take 1.5 tablets (150 mg total) by mouth at bedtime. Take One and a Half tablet (150mg ) by mouth once daily At noontime What changed:    how much to take  how to take this  when to take this   LUTEIN PO Take 25 mg by mouth daily.   Magnesium 250 MG Tabs Take 1 tablet by mouth daily.   metoprolol succinate 50 MG 24 hr tablet Commonly known as:  TOPROL-XL Take 1 tablet (50 mg total) by mouth every evening. Take with or immediately following a meal.   montelukast 10 MG tablet Commonly known as:  SINGULAIR TAKE 1 TABLET BY MOUTH  DAILY What changed:    how much to take  how to take this  when to take this   potassium chloride SA 20 MEQ tablet Commonly known as:  K-DUR,KLOR-CON Take 20 mEq by mouth daily.   PRESERVISION AREDS 2 Caps Take 1 capsule by mouth daily.   vitamin B-12 1000 MCG tablet Commonly known as:  CYANOCOBALAMIN Take 1,000 mcg by mouth daily.   Vitamin D 2000 units Caps Take 1 capsule by mouth daily.   XARELTO 15 MG Tabs tablet Generic drug:  Rivaroxaban TAKE 1 TABLET BY MOUTH  DAILY WITH SUPPER What changed:  See the new instructions.      Follow-up Information    Reed, Tiffany L, DO Follow up in 1 week(s).   Specialty:  Geriatric Medicine Why:  follow up BMP, BP check Contact information: [place] [address] [phone]            Time coordinating discharge: 35 min  Signed:  Geradine Girt   [facility] 06/26/2017, 9:39 AM

## 2017-06-26 NOTE — Progress Notes (Signed)
Physical Therapy Treatment Patient Details Name: Jody Blake MRN: [id number] DOB: 02/13/1926 Today's Date: 06/26/2017    History of Present Illness 84 year old female was admitted with L facial droop.  CT scan was negative for acute event and pt cannot have an MRI due to pacemaker.  Suspected TIA.  PMH:  pacemaker, HTN, COPD, and PAF, DDD, anemia, aortic stenosis, complete heart block, macular degeneration, R TKA, PPM, AV node ablation and several cardioversions.      PT Comments    Patient received in bed, very pleasant and willing to participate with skilled PT services this morning; continued gait and balance training, with patient able to score 19/24 on DGI with most severe deficits being balance with head turns and on stairs. She reports no concerns regarding her return home, and her son was educated regarding functional performance as well as new PT recommendation for skilled OP PT services to address functional strength and balance deficits. She was left sitting at EOB with RN present and attending, all needs otherwise met.    Follow Up Recommendations  Outpatient PT     Equipment Recommendations  None recommended by PT    Recommendations for Other Services       Precautions / Restrictions Precautions Precautions: Fall Restrictions Weight Bearing Restrictions: No    Mobility  Bed Mobility Overal bed mobility: Modified Independent             General bed mobility comments: extended time   Transfers Overall transfer level: Needs assistance Equipment used: None Transfers: Sit to/from Stand Sit to Stand: Supervision         General transfer comment: S for safety, mobility improved and able to perform in only one attempt this morning   Ambulation/Gait Ambulation/Gait assistance: Supervision Ambulation Distance (Feet): 200 Feet Assistive device: None Gait Pattern/deviations: Step-through pattern     General Gait Details: gait pattern much improved today,  no staggering noted and patient fairly steady this morning    Stairs            Wheelchair Mobility    Modified Rankin (Stroke Patients Only) Modified Rankin (Stroke Patients Only) Pre-Morbid Rankin Score: No significant disability Modified Rankin: Moderate disability     Balance Overall balance assessment: Needs assistance Sitting-balance support: Feet supported;No upper extremity supported Sitting balance-Leahy Scale: Good     Standing balance support: No upper extremity supported Standing balance-Leahy Scale: Good                   Standardized Balance Assessment Standardized Balance Assessment : Dynamic Gait Index   Dynamic Gait Index Level Surface: Normal Change in Gait Speed: Mild Impairment Gait with Horizontal Head Turns: Mild Impairment Gait with Vertical Head Turns: Mild Impairment Gait and Pivot Turn: Mild Impairment Step Over Obstacle: Normal Step Around Obstacles: Normal Steps: Mild Impairment Total Score: 19      Cognition Arousal/Alertness: Awake/alert Behavior During Therapy: WFL for tasks assessed/performed Overall Cognitive Status: Within Functional Limits for tasks assessed                                        Exercises      General Comments        Pertinent Vitals/Pain Pain Assessment: No/denies pain    Home Living  Prior Function            PT Goals (current goals can now be found in the care plan section) Acute Rehab PT Goals Patient Stated Goal: return to independence PT Goal Formulation: With patient/family Time For Goal Achievement: 07/09/17 Potential to Achieve Goals: Good Progress towards PT goals: Progressing toward goals    Frequency    Min 4X/week      PT Plan Discharge plan needs to be updated    Co-evaluation              AM-PAC PT "6 Clicks" Daily Activity  Outcome Measure  Difficulty turning over in bed (including adjusting bedclothes,  sheets and blankets)?: None Difficulty moving from lying on back to sitting on the side of the bed? : None Difficulty sitting down on and standing up from a chair with arms (e.g., wheelchair, bedside commode, etc,.)?: None Help needed moving to and from a bed to chair (including a wheelchair)?: None Help needed walking in hospital room?: None Help needed climbing 3-5 steps with a railing? : A Little 6 Click Score: 23    End of Session Equipment Utilized During Treatment: Gait belt Activity Tolerance: Patient tolerated treatment well Patient left: in bed;with call bell/phone within reach;with nursing/sitter in room(sitting at EOB with nursing staff present ) Nurse Communication: Mobility status PT Visit Diagnosis: Unsteadiness on feet (R26.81);Other symptoms and signs involving the nervous system (T21.828)     Time: 8337-4451 PT Time Calculation (min) (ACUTE ONLY): 19 min  Charges:  $Gait Training: 8-22 mins                    G Codes:       Deniece Ree PT, DPT, CBIS  Supplemental Physical Therapist [place]   Pager [phone]

## 2017-06-26 NOTE — Progress Notes (Signed)
NEUROHOSPITALISTS STROKE TEAM - DAILY PROGRESS NOTE   ADMISSION HISTORY: Jody Blake is a 84 y.o. female with a history of heart block status post pacemaker, aortic stenosis, hypertension, A. fib on Xarelto who presents with transient left-sided weakness.  This apparently occurred twice, once during dinner and then again after EMS arrived and it was witnessed by EMS.  The patient does not feel that she had any episode of loss of consciousness or amnestic episode, but she did not notice that she was having left-sided weakness during the episode.  Per her report, she never felt that anything was out of the ordinary  SUBJECTIVE (INTERVAL HISTORY) Patient sitting at the edge of bed with her family member in attendance.  Patient is very hard of hearing,   She admits to improvement in left-sided weakness. Carotid dopplers and echo were both unremarkable.  Laboratory Results  CBC:  Recent Labs  Lab 06/24/17 2102 06/24/17 2144  WBC 6.5  --   HGB 12.9 13.6  HCT 40.2 40.0  MCV 99.0  --   PLT 264  --    BMP: Recent Labs  Lab 06/24/17 2102 06/24/17 2144 06/25/17 1044  NA 137 140 140  K 3.7 3.5 3.6  CL 103 103 105  CO2 24  --  27  GLUCOSE 129* 129* 103*  BUN 34* 36* 21*  CREATININE 1.22* 1.20* 0.79  CALCIUM 9.5  --  9.0   Coagulation Studies:  Recent Labs    06/24/17 2102  APTT 27  INR 1.06   Recent Labs  Lab 06/25/17 0428  COLORURINE YELLOW  APPEARANCEUR CLEAR  LABSPEC 1.017  PHURINE 6.0  GLUCOSEU NEGATIVE  HGBUR NEGATIVE  BILIRUBINUR NEGATIVE  KETONESUR NEGATIVE  PROTEINUR NEGATIVE  NITRITE NEGATIVE  LEUKOCYTESUR TRACE*   Urine Drug Screen:     Component Value Date/Time   LABOPIA NONE DETECTED 06/25/2017 0428   COCAINSCRNUR NONE DETECTED 06/25/2017 0428   LABBENZ NONE DETECTED 06/25/2017 0428   AMPHETMU NONE DETECTED 06/25/2017 0428   THCU NONE DETECTED 06/25/2017 0428   LABBARB NONE DETECTED  06/25/2017 0428    Alcohol Level:  Recent Labs  Lab 06/25/17 0229  ETH <10    Physical Examination   Vitals:   06/25/17 2007 06/25/17 2353 06/26/17 0424 06/26/17 0845  BP: (!) 175/91 (!) 177/87 (!) 185/94 (!) 161/66  Pulse: 68 74 61 67  Resp: 18 18 18 18   Temp: 97.8 F (36.6 C) 98 F (36.7 C) 97.8 F (36.6 C)   TempSrc: Oral Oral Oral   SpO2: 95% 97% 97% 99%  Weight:      Height:       General - Well nourished, well developed, in no apparent distress HEENT-  Normocephalic, hard of hearing Cardiovascular - Regular rate and rhythm  Respiratory - Lungs clear bilaterally. No wheezing. Abdomen - soft and non-tender, BS normal Extremities- no edema or cyanosis  Neurological Examination  Mental Status -  Level of arousal and orientation to time, place, and person were intact. Language including expression, naming, repetition, comprehension was assessed and found intact. Attention span and concentration were normal Recent and remote memory were intact Fund of Knowledge was assessed and was intact Cranial Nerves II - XII - II - Visual field intact OU III, IV, VI - Extraocular movements intact. V - Facial sensation intact bilaterally. VII - Facial movement intact bilaterally VIII - Hearing & vestibular intact bilaterally X - Palate elevates symmetrically XI - Chin turning & shoulder shrug intact bilaterally.  XII - Tongue protrusion intact Motor Strength - The patient's strength was normal in all extremities and pronator drift was absent.  Bulk was normal and fasciculations were absent  Motor Tone - Muscle tone was assessed at the neck and appendages and was normal Reflexes - The patient's reflexes were symmetrical in all extremities and she had no pathological reflexes Sensory - Light touch was assessed and was symmetrical Coordination - The patient had normal movements in the hands and feet with no ataxia or dysmetria.  Tremor was absent Gait and Station -  deferred.  Imaging Results  Ct Head Wo Contrast 06/24/2017 IMPRESSION:  1. Stable atrophy with moderate degree of chronic small vessel ischemic disease. No acute intracranial abnormality identified.  2. Chronic partial opacification of the right mastoid likely representing a tiny effusion.   ECHO : Left ventricle: The cavity size was normal. Wall thickness was increased in a pattern of moderate LVH. Systolic function was normal. The estimated ejection fraction was in the range of 60% to 65%. Wall motion was normal; there were no regional wall motion abnormalities. - Aortic valve: Moderately calcified annulus. Moderately thickened,   moderately calcified leaflets.  Carotid dopplers : Right Carotid: Velocities in the right ICA are consistent with a 1-39% stenosis. Left Carotid: Velocities in the left ICA are consistent with a 1-39% stenosis. Vertebrals: Unable to visualize right vertebral and no flow detected in left      vertebral artery. Transcranial Dopplers : Normal mean flow velocities in majority of identified vessels of anterior and posterior circulation except damped left vertebral artery waveform suggests distal stenosis versus occlusion. Globally elevated pulsatility indexes suggest diffuse intracranial atherosclerosis likely IMPRESSION AND PLAN  [name] is a 84 y.o. female with history of heart block status post pacemaker, aortic stenosis, hypertension, A. fib on Xarelto who presents with transient left-sided weakness.  This apparently occurred twice, once during dinner and then again after EMS arrived and it was witnessed by EMS.  1.  TIA-patient with presenting symptoms of transient left-sided weakness which has since resolved.  CT of the head showed stable atrophy with a moderate degree of chronic small vessel disease, but no acute intracranial abnormalities.  MRI was unable to be performed due to patient having pacemaker.  No CTA performed due to initial AKI on  admission. Carotid ultrasound, transcranial Dopplers and echo were fairly unremarkable except poor visualization of the vertebral artery. Etiology of patient's TIAs likely cardioembolic hence we will not pursue further evaluation for occlusive posterior circulation disease.  We will continue patient on atorvastatin 40 mg daily as well as Xarelto for secondary stroke prevention.  We will continue early rehab with physical therapy, occupational therapy and speech therapy.  Stroke Risk Factors: atrial fibrillation, hyperlipidemia and hypertension Other Stroke Risk Factors: Advanced age,   ABCD2 Score: 5   PLAN 06/26/2017  Continue Xarelto Continue Statin Telemetry monitoring PT/OT/SLP Pending transcranial Doppler and bilateral carotid Doppler. Pending echocardiogram Consult Case Management /MSW Ongoing aggressive stroke risk factor management Patient counseled to be compliant with her/his antithrombotic medications Patient counseled on Lifestyle modifications including, Diet, Exercise, and Stress  Follow up with [facility] in 6 weeks   [place] by internal medicine  A. fib, patient with pacemaker and currently on Xarelto HAS-BLED Score:3 CHAD-VASC Score:5  ASTHMA/COPD  HYPOTHYROIDSM  ACUTE RENAL FAILURE  UNCONTROLLED HYPERTENSION:  HYPERLIPIDEMIA:    Component Value Date/Time   CHOL 198 06/25/2017 0229   TRIG 57 06/25/2017  0229   HDL 63 06/25/2017 0229   CHOLHDL 3.1 06/25/2017 0229   VLDL 11 06/25/2017 0229   LDLCALC 124 (H) 06/25/2017 0229   DIABETES: Lab Results  Component Value Date   HGBA1C 5.7 (H) 06/25/2017      Hospital day # 0 VTE prophylaxis:Xarelto Diet : Diet Heart Room service appropriate? Yes; Fluid consistency: Thin Fall precautions Diet - low sodium heart healthy     FAMILY UPDATES: daughter at bedside TEAM UPDATES: Geradine Girt, DO STATUS:    Discharge Information   Discharge Stroke Meds:  Please  discharge patient on Xarelto (rivaroxaban) daily , ATORVASTATIN 40MG     Disposition: Discharge disposition: 01-Home or Self Care      Therapy Recs:               PT No recommendations Home Equipment:         PENDING  Follow up Appointments  Follow Up:  Follow-up Information    Reed, Tiffany L, DO Follow up in 1 week(s).   Specialty:  Geriatric Medicine Why:  follow up BMP, BP check Contact information: [address] [place] [address] [phone]        referral made for outpatient neurology follow up Follow up.          Reed, Tiffany L, DO -PCP Follow up in 1-2 weeks   Case Management aware of need  Follow-up with neurology in 6 weeks.   Assessment & plan discussed with with attending physician and they are in agreement.    Burnetta Sabin NP  Stroke Neurology Team 06/26/2017, 10:36 AM  06/26/2017 ATTENDING ASSESSMENT     .  Discussed with Dr. Eliseo Squires  DC home follow-up as an outpatient in stroke clinic in 6 weeks. Stroke team will sign off. Kindly call for questions.  Antony Contras, MD Medical Director [facility] Pager: [phone] 06/26/2017 10:36 AM  To contact Stroke Provider, please refer to [email or web address] After hours, contact General Neurology

## 2017-06-26 NOTE — Progress Notes (Signed)
Discharge instructions reviewed with pt and her son.  Copy of instructions given to pt, 1 script sent to pt's pharmacy by MD.   Pt d/c'd via wheelchair with belongings, with son.          Escorted by hospital volunteer.

## 2017-06-27 ENCOUNTER — Telehealth: Payer: Self-pay

## 2017-06-27 NOTE — Telephone Encounter (Signed)
I have made the 1st attempt to contact the patient or family member in charge, in order to follow up from recently being discharged from the hospital. I left a message on voicemail but I will make another attempt at a different time.  

## 2017-06-30 ENCOUNTER — Telehealth: Payer: Self-pay

## 2017-06-30 NOTE — Telephone Encounter (Signed)
Transition Care Management Follow-Up Telephone Call   Date discharged and where: [facility] on 06/26/2017  How have you been since you were released from the hospital? Feeling fatigued and some weakness.   Any patient concerns? None  Items Reviewed:   Meds: Y  Allergies: Y  Dietary Changes Reviewed: Y  Functional Questionnaire:  Independent-I Dependent-D  ADLs:   Dressing- I    Eating- I   Maintaining continence- I   Transferring- I   Transportation- D   Meal Prep- I   Managing Meds- I  Confirmed importance and Date/Time of follow-up visits scheduled: Yes, 07/09/2017 @ 9am   Confirmed with patient if condition worsens to call PCP or go to the Emergency Dept. Patient was given office number and encouraged to call back with questions or concerns: yes

## 2017-07-08 DIAGNOSIS — Z961 Presence of intraocular lens: Secondary | ICD-10-CM | POA: Diagnosis not present

## 2017-07-08 DIAGNOSIS — H401131 Primary open-angle glaucoma, bilateral, mild stage: Secondary | ICD-10-CM | POA: Insufficient documentation

## 2017-07-08 DIAGNOSIS — H353122 Nonexudative age-related macular degeneration, left eye, intermediate dry stage: Secondary | ICD-10-CM | POA: Diagnosis not present

## 2017-07-08 DIAGNOSIS — H35371 Puckering of macula, right eye: Secondary | ICD-10-CM | POA: Insufficient documentation

## 2017-07-08 DIAGNOSIS — H353114 Nonexudative age-related macular degeneration, right eye, advanced atrophic with subfoveal involvement: Secondary | ICD-10-CM | POA: Diagnosis not present

## 2017-07-08 DIAGNOSIS — D3132 Benign neoplasm of left choroid: Secondary | ICD-10-CM | POA: Diagnosis not present

## 2017-07-09 ENCOUNTER — Non-Acute Institutional Stay: Payer: Medicare Other | Admitting: Internal Medicine

## 2017-07-09 ENCOUNTER — Encounter: Payer: Self-pay | Admitting: Internal Medicine

## 2017-07-09 VITALS — BP 132/78 | HR 83 | Temp 97.8°F | Ht 64.0 in | Wt 136.0 lb

## 2017-07-09 DIAGNOSIS — I5032 Chronic diastolic (congestive) heart failure: Secondary | ICD-10-CM

## 2017-07-09 DIAGNOSIS — N179 Acute kidney failure, unspecified: Secondary | ICD-10-CM

## 2017-07-09 DIAGNOSIS — J449 Chronic obstructive pulmonary disease, unspecified: Secondary | ICD-10-CM | POA: Diagnosis not present

## 2017-07-09 DIAGNOSIS — G459 Transient cerebral ischemic attack, unspecified: Secondary | ICD-10-CM | POA: Diagnosis not present

## 2017-07-09 DIAGNOSIS — E7800 Pure hypercholesterolemia, unspecified: Secondary | ICD-10-CM

## 2017-07-09 DIAGNOSIS — R270 Ataxia, unspecified: Secondary | ICD-10-CM | POA: Diagnosis not present

## 2017-07-09 DIAGNOSIS — I6503 Occlusion and stenosis of bilateral vertebral arteries: Secondary | ICD-10-CM | POA: Diagnosis not present

## 2017-07-09 NOTE — Progress Notes (Signed)
Location:  [facility] clinic Provider: Eudell Mcphee L. Mariea Clonts, D.O., C.M.D.  Code Status: DNR Goals of Care:  Advanced Directives 07/09/2017  Does Patient Have a Medical Advance Directive? Yes  Type of Paramedic of [place];Living will;Out of facility DNR (pink MOST or yellow form)  Does patient want to make changes to medical advance directive? No - Patient declined  Copy of [place] in Chart? Yes  Would patient like information on creating a medical advance directive? No - Patient declined  Pre-existing out of facility DNR order (yellow form or pink MOST form) Yellow form placed in chart (order not valid for inpatient use)     Chief Complaint  Patient presents with  . Transitions Of Care    06/24/2017 - 06/26/2017 (2 days)    HPI: Patient is a 84 y.o. female seen today for 14-day transitions of care visit s/p hospitalization for left-sided facial droop and weakness on the left side noted by a friend while they were having dinner after she passed out.  She has a h/o afib s/p ablation and is pacemaker dependent.  She has aortic stenosis, asthma, htn.  She thought she may have passed out.  The facial droop lasted only minutes and resolved.  She had no weakness, visual symptoms, difficulty swallowing or speaking.  CT did not show any acute changes, but did reveal chronic ischemic vascular changes.  MRI could not be done due to pacemaker.  LDL was elevated at 124 and she was put on a statin.  ASA not added due to xarelto.  Echo showed moderate LVH, normal EF 60-65%, moderate aortic stenosis.  She also had acute renal failure which improved with hydration.  She has grade 2 diastolic dysfunction.  Carotid dopplers and transcranial doppler were still pending at discharge 3/20.  These were reviewed as below.   She reports that a few months ago when [facility] sent her to the hospital (11/14/16), she thinks she had a TIA due to balance problems and another time she had  trouble talking over 2 years ago and didn't even tell anyone.    She just does not feel good, no energy.  Had acute renal failure that improved with hydration.    She was not thrilled about going on the statin therapy--on lipitor 40mg  started in the hospital.    Blood pressure is better now.    She remains itchy.  Has been told by derm that it's a return of psoriasis.  She also has two areas of squamous cell on the legs.  She is using lots of lotion.  I had initially seen her about her rash in end of November.    She was seen by the retina specialist yesterday.  He felt her bp was affecting her vision.  It declined significantly since her last visit.    Past Medical History:  Diagnosis Date  . Anemia   . Aortic stenosis    a. Echo 09/06/12 EF 55-60%, no WMA, G2DD, Ao valve sclerosis w/ mod stenosis, LA mildly dilated, PA pressure 18mmHg  . Asthmatic bronchitis   . Complete heart block [facility])    s/p permanent pacemaker 06/27/1999 (Battery change 06/2007 and 2016).  s/p AV nodal ablation by Dr [name] 2016.  [name] COPD (chronic obstructive pulmonary disease) (HCC)    Dr. [name]  . DDD (degenerative disc disease)   . Diastolic dysfunction    a. Echo 09/06/12 EF 55-60%, no WMA, G2DD, Ao valve sclerosis w/ mod stenosis, LA  mildly dilated, PA pressure 40mmHg  . Diverticulosis   . DVT (deep vein thrombosis) in pregnancy [facility]) 1954   a. LLE  . Hypertension   . Hypothyroidism    on medication  . Macular degeneration    Dr. [name]  . Osteoarthrosis, unspecified whether generalized or localized, other specified sites   . PAF (paroxysmal atrial fibrillation) (HCC)    Stopped flecainide, on amiodarone but still has bouts of A FIB (mostly in mornings).   . Pure hypercholesterolemia   . Staghorn calculus    Left    Past Surgical History:  Procedure Laterality Date  . APPENDECTOMY  ~ 1941  . AV NODE ABLATION  05/10/2014  . AV NODE ABLATION N/A 05/10/2014   Procedure: AV NODE ABLATION;  Surgeon: Thompson Grayer, MD;  Location: [facility] CATH LAB;  Service: Cardiovascular;  Laterality: N/A;  . BUNIONECTOMY WITH HAMMERTOE RECONSTRUCTION Bilateral ~ 1990  . CARDIAC PACEMAKER PLACEMENT  06/27/99   Medtronic PM implanted by Dr [name]  . CARDIOVERSION N/A 12/02/2013   Procedure: CARDIOVERSION;  Surgeon: [name], MD;  Location: [facility] ENDOSCOPY;  Service: Cardiovascular;  Laterality: N/A;  . CATARACT EXTRACTION W/ INTRAOCULAR LENS  IMPLANT, BILATERAL Bilateral   . COLONOSCOPY WITH PROPOFOL N/A 04/22/2013   Procedure: COLONOSCOPY WITH PROPOFOL;  Surgeon: [name], MD;  Location: [facility] ENDOSCOPY;  Service: Endoscopy;  Laterality: N/A;  . DILATION AND CURETTAGE OF UTERUS  X 2   "when I was going thru menopause"  . ESOPHAGOGASTRODUODENOSCOPY (EGD) WITH PROPOFOL N/A 04/22/2013   Procedure: ESOPHAGOGASTRODUODENOSCOPY (EGD) WITH PROPOFOL;  Surgeon: [name], MD;  Location: [facility] ENDOSCOPY;  Service: Endoscopy;  Laterality: N/A;  . HERNIA REPAIR    . INCISIONAL HERNIA REPAIR    . INSERT / REPLACE / REMOVE PACEMAKER  06/2007   "took out the old; put in new"  . INSERT / REPLACE / REMOVE PACEMAKER  05/10/2014   MDT PPM generator change by Dr [name]  . JOINT REPLACEMENT    . PARTIAL NEPHRECTOMY Left 05/1974   stone disease  . PERMANENT PACEMAKER GENERATOR CHANGE N/A 05/10/2014   Procedure: PERMANENT PACEMAKER GENERATOR CHANGE;  Surgeon: Thompson Grayer, MD;  Location: [facility] CATH LAB;  Service: Cardiovascular;  Laterality: N/A;  . TONSILLECTOMY AND ADENOIDECTOMY  1930's  . TOTAL KNEE ARTHROPLASTY Right 2001    Allergies  Allergen Reactions  . Brimonidine Tartrate-Timolol Other (See Comments)    REACTION: systemic malaise amigen eye drop  . Penicillins Hives  . Breo Ellipta [Fluticasone Furoate-Vilanterol] Other (See Comments)    Ran blood pressure up Increased blood pressure    Outpatient Encounter Medications as of 07/09/2017  Medication Sig  . albuterol (PROVENTIL HFA;VENTOLIN HFA) 108 (90 Base) MCG/ACT  inhaler Inhale 2 puffs into the lungs every 6 (six) hours as needed for wheezing or shortness of breath.  [name] amLODipine (NORVASC) 5 MG tablet Take 1 tablet (5 mg total) by mouth daily.  [name] atorvastatin (LIPITOR) 40 MG tablet Take 1 tablet (40 mg total) by mouth daily at 6 PM.  . azelastine (ASTELIN) 0.1 % nasal spray Place 1-2 sprays into both nostrils at bedtime. Use in each nostril as directed  . Biotin 1 MG CAPS Take 1 mg by mouth daily.   . Cholecalciferol (VITAMIN D) 2000 units CAPS Take 1 capsule by mouth daily.  . Fluticasone-Salmeterol (ADVAIR) 250-50 MCG/DOSE AEPB Inhale 1 puff into the lungs every morning.  . furosemide (LASIX) 40 MG tablet TAKE 1 TABLET BY MOUTH  DAILY  .  latanoprost (XALATAN) 0.005 % ophthalmic solution Place 1 drop into both eyes at bedtime.  [name] levothyroxine (SYNTHROID, LEVOTHROID) 88 MCG tablet TAKE 1 TABLET BY MOUTH  DAILY BEFORE BREAKFAST  . losartan (COZAAR) 100 MG tablet Take 1.5 tablets (150 mg total) by mouth at bedtime. Take One and a Half tablet (150mg ) by mouth once daily At noontime  . LUTEIN PO Take 25 mg by mouth daily.   . Magnesium 250 MG TABS Take 1 tablet by mouth daily.  . metoprolol succinate (TOPROL-XL) 50 MG 24 hr tablet Take 1 tablet (50 mg total) by mouth every evening. Take with or immediately following a meal.  . montelukast (SINGULAIR) 10 MG tablet TAKE 1 TABLET BY MOUTH  DAILY  . Multiple Vitamins-Minerals (PRESERVISION AREDS 2) CAPS Take 1 capsule by mouth daily.  . potassium chloride SA (K-DUR,KLOR-CON) 20 MEQ tablet Take 20 mEq by mouth daily.   . vitamin B-12 (CYANOCOBALAMIN) 1000 MCG tablet Take 1,000 mcg by mouth daily.  Alveda Reasons 15 MG TABS tablet TAKE 1 TABLET BY MOUTH  DAILY WITH SUPPER   No facility-administered encounter medications on file as of 07/09/2017.     Review of Systems:  Review of Systems  Constitutional: Positive for malaise/fatigue. Negative for chills, fever and weight loss.  HENT: Positive for hearing loss.  Negative for congestion.   Eyes: Positive for blurred vision.  Respiratory: Negative for shortness of breath and wheezing.   Cardiovascular: Negative for chest pain and palpitations.  Gastrointestinal: Negative for abdominal pain and constipation.  Genitourinary: Negative for dysuria.  Musculoskeletal: Negative for falls and joint pain.  Skin: Positive for itching and rash.  Neurological: Positive for weakness. Negative for dizziness, tingling, tremors, sensory change, speech change, focal weakness, seizures, loss of consciousness and headaches.  Psychiatric/Behavioral: Negative for depression and memory loss.    Health Maintenance  Topic Date Due  . TETANUS/TDAP  02/15/1945  . DEXA SCAN  02/14/1993  . PNA vac Low Risk Adult (2 of 2 - PCV13) 11/24/2014  . INFLUENZA VACCINE  11/06/2017    Physical Exam: Vitals:   07/09/17 0856  BP: 132/78  Pulse: 83  Temp: 97.8 F (36.6 C)  TempSrc: Oral  SpO2: 98%  Weight: 136 lb (61.7 kg)  Height: 5\' 4"  (1.626 m)   Body mass index is 23.34 kg/m. Physical Exam  Constitutional: She is oriented to person, place, and time. She appears well-developed and well-nourished. No distress.  HENT:  Head: Normocephalic and atraumatic.  HOH  Eyes: Pupils are equal, round, and reactive to light. EOM are normal.  Cardiovascular:  Murmur heard. irreg irreg, 2/6 systolic murmur  Pulmonary/Chest: Effort normal and breath sounds normal. She has no wheezes.  Dry cough  Abdominal: Bowel sounds are normal.  Neurological: She is alert and oriented to person, place, and time. She displays normal reflexes. No cranial nerve deficit or sensory deficit. She exhibits normal muscle tone.  Appears to be dragging right leg when walking; ataxic gait walking off to either side  Skin: Skin is warm and dry. Capillary refill takes less than 2 seconds. Rash noted.  Psychiatric: She has a normal mood and affect.    Labs reviewed: Basic Metabolic Panel: Recent Labs     11/15/16 0804 06/24/17 2102 06/24/17 2144 06/25/17 1044  NA 141 137 140 140  K 3.4* 3.7 3.5 3.6  CL 104 103 103 105  CO2 29 24  --  27  GLUCOSE 103* 129* 129* 103*  BUN 14 34* 36* 21*  CREATININE 0.77 1.22* 1.20* 0.79  CALCIUM 9.3 9.5  --  9.0   Liver Function Tests: Recent Labs    11/14/16 1636 06/24/17 2102  AST 29 34  ALT 16 17  ALKPHOS 66 67  BILITOT 0.7 0.4  PROT 7.2 6.5  ALBUMIN 4.2 3.8   No results for input(s): LIPASE, AMYLASE in the last 8760 hours. No results for input(s): AMMONIA in the last 8760 hours. CBC: Recent Labs    11/14/16 1636 06/24/17 2102 06/24/17 2144  WBC 6.8 6.5  --   NEUTROABS 4.4 4.1  --   HGB 14.4 12.9 13.6  HCT 43.5 40.2 40.0  MCV 96.0 99.0  --   PLT 264 264  --    Lipid Panel: Recent Labs    11/15/16 0327 06/25/17 0229  CHOL 205* 198  HDL 64 63  LDLCALC 126* 124*  TRIG 74 57  CHOLHDL 3.2 3.1   Lab Results  Component Value Date   HGBA1C 5.7 (H) 06/25/2017    Procedures since last visit: Ct Head Wo Contrast  Result Date: 06/24/2017 CLINICAL DATA:  Slurred speech and left-sided facial droop. EXAM: CT HEAD WITHOUT CONTRAST TECHNIQUE: Contiguous axial images were obtained from the base of the skull through the vertex without intravenous contrast. COMPARISON:  11/14/2016 and priors FINDINGS: Brain: Chronic stable atrophy and moderate small vessel ischemic disease. No acute intracranial hemorrhage, midline shift or edema. No large vascular territory infarct. Hydrocephalus. Midline fourth ventricle and basal cisterns without effacement. Vascular: No hyperdense vessel sign. Skull: Intact Sinuses/Orbits: Nonacute Other: Chronic partial inferior mastoid effusion. IMPRESSION: 1. Stable atrophy with moderate degree of chronic small vessel ischemic disease. No acute intracranial abnormality identified. 2. Chronic partial opacification of the right mastoid likely representing a tiny effusion. Electronically Signed   By: Ashley Royalty M.D.    On: 06/24/2017 22:20   TCD 06/25/17:  Normal mean flow velocities in majority of identified vessels of anterior and posterior circulation except damped left vertebral artery waveform suggests distal stenosis versus occlusion. Globally elevated pulsatility indexes suggest diffuse intracranial atherosclerosis likely  Carotid dopplers 06/25/17:  Right Carotid: Velocities in the right ICA are consistent with a 1-39% stenosis. Left Carotid: Velocities in the left ICA are consistent with a 1-39% stenosis. Vertebrals: Unable to visualize right vertebral and no flow detected in left vertebral artery.  Assessment/Plan 1. Ataxia -seems this is due to #2 and "chronic small vessel ischemic disease" -continue secondary stroke prevention as she tolerates and f/u flp before next visit -this is not new, has had it to some degree, but does seem a bit worse since last hospitalization -not falling  2. Vertebral artery occlusion, bilateral -noted on left side for sure and could not visualize on right on TCD or carotid doppler studies 06/25/17  3. TIA (transient ischemic attack) -has temporary facial droop per notes, but patient reports she had right sided weakness not facial droop, but also admits she does not remember the events well when she had this last episode  4. COPD mixed type ([place]) -is dyspneic on exertion to some degree chronically, continues on singulair, prn albuterol, and advair--seems other alternatives elevated her bp tremendously  5. Chronic diastolic congestive heart failure (HCC) -stable now with current regimen  6. Acute renal failure, unspecified acute renal failure type ([place]) -resolved with normalized creatinine, hydration and po intake encouraged  7. Pure hypercholesterolemia -continue lipitor whih was started--she has always refused statins for fear of weakness and myalgias--monitor for these effects  F/u neurology  08/06/17  Labs/tests ordered:  Cbc with diff, cmp, fasting lipid  before Next appt:  11/12/2017  Arneda Sappington L. Irine Heminger, D.O. [facility] [address], [place] [address] Cell Phone (Mon-Fri 8am-5pm):  [phone] On Call:  [phone] & follow prompts after 5pm & weekends Office Phone:  [phone] Office Fax:  [phone]

## 2017-07-22 ENCOUNTER — Other Ambulatory Visit: Payer: Self-pay | Admitting: Cardiology

## 2017-07-28 ENCOUNTER — Telehealth: Payer: Self-pay | Admitting: *Deleted

## 2017-07-28 ENCOUNTER — Telehealth: Payer: Self-pay | Admitting: Internal Medicine

## 2017-07-28 DIAGNOSIS — E7800 Pure hypercholesterolemia, unspecified: Secondary | ICD-10-CM

## 2017-07-28 DIAGNOSIS — G459 Transient cerebral ischemic attack, unspecified: Secondary | ICD-10-CM

## 2017-07-28 DIAGNOSIS — I6503 Occlusion and stenosis of bilateral vertebral arteries: Secondary | ICD-10-CM

## 2017-07-28 MED ORDER — ATORVASTATIN CALCIUM 40 MG PO TABS: 40.0000 mg | ORAL_TABLET | Freq: Every day | ORAL | 0 refills | Status: DC

## 2017-07-28 NOTE — Telephone Encounter (Signed)
Suggest change to Breo 100     Inhale 1 puff,, then rinse mouth, once daily   Ref x 12

## 2017-07-28 NOTE — Telephone Encounter (Signed)
.  left message to have patient return my call.  

## 2017-07-28 NOTE — Telephone Encounter (Signed)
Called [facility], states that Advair is not covered by pt's insurance.  Covered alternatives are: Breo, combivent respimat, fluticasone, and Symbicort.    CY please advise on which covered alternative you'd like to prescribe.  Thanks!

## 2017-07-28 NOTE — Telephone Encounter (Signed)
Pt should continue on the atorvastatin.  Does she want 30 day supplies to [facility] or 90 days for mail order?

## 2017-07-28 NOTE — Telephone Encounter (Signed)
Patient called and stated that the Hospital placed her on Cholesterol medication, Atorvastatin and patient is calling wanting to make sure with you that you want her to continue this medication. If so needs Rx faxed to pharmacy. Pended Rx. Please Advise.

## 2017-07-29 MED ORDER — BUDESONIDE-FORMOTEROL FUMARATE 160-4.5 MCG/ACT IN AERO: 2.0000 | INHALATION_SPRAY | Freq: Two times a day (BID) | RESPIRATORY_TRACT | 2 refills | Status: DC

## 2017-07-29 NOTE — Telephone Encounter (Signed)
Spoke with patient. She is aware of the switch to Symbicort 160. She stated that she has used this before in the past and it worked well for her.   She wishes to use [facility] for this as well.   Medication has been sent in. Nothing else needed at time of call.

## 2017-07-29 NOTE — Telephone Encounter (Signed)
Instead lets try Symbicort 160, # 3    Inhale 2 puffs, then rinse mouth,m twice daily,    Ref x 3

## 2017-07-29 NOTE — Telephone Encounter (Signed)
Spoke with patient. She is aware of the medication change. She wishes to have the Breo 100 sent to [facility].   Went attempting to send in the [employer], a hard stop showed that she has an intolerance to Delta. It caused her blood pressure to increase.   Dr. [name], please advise if you are still ok with [place] sending in [place] 100.

## 2017-07-30 ENCOUNTER — Other Ambulatory Visit: Payer: Self-pay | Admitting: Internal Medicine

## 2017-08-05 NOTE — Progress Notes (Signed)
[facility] [address] [place]. [place] [address] [phone]       OFFICE FOLLOW UP NOTE  Jody Blake Date of Birth:  [birth year] Medical Record Number:  [id number]   Reason for Referral:  hospital TIA follow up  CHIEF COMPLAINT:  Chief Complaint  Patient presents with  . Follow-up    TIA follow up from Hospital     HPI: Jody Blake is being seen today for initial visit in the office for TIA on 06/24/17. History obtained from patient and chart review. Reviewed all radiology images and labs personally.  Jody Blake is a 84 y.o. female with history of heart block status post pacemaker, aortic stenosis, hypertension, A. fib on Xarelto who presented with transient left-sided weakness. This apparently occurred twice,once during dinner and then again after EMS arrived and it was witnessed by EMS. CT of the head showed stable atrophy with a moderate degree of chronic small vessel disease, but no acute intracranial abnormalities.  MRI was unable to be performed due to patient having pacemaker.  No CTA performed due to initial AKI on admission. Carotid ultrasound, transcranial Dopplers and echo were fairly unremarkable except poor visualization of the vertebral artery. Etiology of patient's TIAs likely cardioembolic so it was determined  not pursue further evaluation for occlusive posterior circulation disease.  Recommended to continue patient on atorvastatin 40 mg daily as well as Xarelto for secondary stroke prevention. Continued early rehab with physical therapy, occupational therapy and speech therapy. Patient discharged in stable condition.   Since discharge, patient has been doing well.  She continues to take Xarelto without side effects of bleeding or bruising.  Continues to take Lipitor without side effects of myalgias.  She stated after the hospital she was fatigue but this has been improving.  Blood pressure at today's visit 143/86 but typically  130s/80s.  She does currently live in an apartment by herself as her husband is currently in SNF.  She continues to do all ADLs and IADLs independently.  Daughter did bring patient to appointment as she is unable to drive due to poor vision.  She does have complaints of the past few months bilateral fingertips numb but denies pain along with intermittent left arm numbness and at night experiences nerve pain.  Denies recent injury or trauma.  She does have a history of neck and back pain with degenerative disc disease.  Denies new or worsening stroke/TIA symptoms.  ROS:   14 system review of systems performed and negative with exception of loss of vision, hearing loss, itching, numbness and weakness in hands  PMH:  Past Medical History:  Diagnosis Date  . Anemia   . Aortic stenosis    a. Echo 09/06/12 EF 55-60%, no WMA, G2DD, Ao valve sclerosis w/ mod stenosis, LA mildly dilated, PA pressure 33mmHg  . Asthmatic bronchitis   . Complete heart block [facility])    s/p permanent pacemaker 06/27/1999 (Battery change 06/2007 and 2016).  s/p AV nodal ablation by Dr [name] 2016.  [name] COPD (chronic obstructive pulmonary disease) (HCC)    Dr. [name]  . DDD (degenerative disc disease)   . Diastolic dysfunction    a. Echo 09/06/12 EF 55-60%, no WMA, G2DD, Ao valve sclerosis w/ mod stenosis, LA mildly dilated, PA pressure 34mmHg  . Diverticulosis   . DVT (deep vein thrombosis) in pregnancy [facility]) 1954   a. LLE  . Hypertension   . Hypothyroidism    on medication  . Macular  degeneration    Dr. [name]  . Osteoarthrosis, unspecified whether generalized or localized, other specified sites   . PAF (paroxysmal atrial fibrillation) (HCC)    Stopped flecainide, on amiodarone but still has bouts of A FIB (mostly in mornings).   . Pure hypercholesterolemia   . Staghorn calculus    Left    PSH:  Past Surgical History:  Procedure Laterality Date  . APPENDECTOMY  ~ 1941  . AV NODE ABLATION  05/10/2014  . AV NODE ABLATION  N/A 05/10/2014   Procedure: AV NODE ABLATION;  Surgeon: Thompson Grayer, MD;  Location: [facility] CATH LAB;  Service: Cardiovascular;  Laterality: N/A;  . BUNIONECTOMY WITH HAMMERTOE RECONSTRUCTION Bilateral ~ 1990  . CARDIAC PACEMAKER PLACEMENT  06/27/99   Medtronic PM implanted by Dr [name]  . CARDIOVERSION N/A 12/02/2013   Procedure: CARDIOVERSION;  Surgeon: [name], MD;  Location: [facility] ENDOSCOPY;  Service: Cardiovascular;  Laterality: N/A;  . CATARACT EXTRACTION W/ INTRAOCULAR LENS  IMPLANT, BILATERAL Bilateral   . COLONOSCOPY WITH PROPOFOL N/A 04/22/2013   Procedure: COLONOSCOPY WITH PROPOFOL;  Surgeon: [name], MD;  Location: [facility] ENDOSCOPY;  Service: Endoscopy;  Laterality: N/A;  . DILATION AND CURETTAGE OF UTERUS  X 2   "when I was going thru menopause"  . ESOPHAGOGASTRODUODENOSCOPY (EGD) WITH PROPOFOL N/A 04/22/2013   Procedure: ESOPHAGOGASTRODUODENOSCOPY (EGD) WITH PROPOFOL;  Surgeon: [name], MD;  Location: [facility] ENDOSCOPY;  Service: Endoscopy;  Laterality: N/A;  . HERNIA REPAIR    . INCISIONAL HERNIA REPAIR    . INSERT / REPLACE / REMOVE PACEMAKER  06/2007   "took out the old; put in new"  . INSERT / REPLACE / REMOVE PACEMAKER  05/10/2014   MDT PPM generator change by Dr [name]  . JOINT REPLACEMENT    . PARTIAL NEPHRECTOMY Left 05/1974   stone disease  . PERMANENT PACEMAKER GENERATOR CHANGE N/A 05/10/2014   Procedure: PERMANENT PACEMAKER GENERATOR CHANGE;  Surgeon: Thompson Grayer, MD;  Location: [facility] CATH LAB;  Service: Cardiovascular;  Laterality: N/A;  . TONSILLECTOMY AND ADENOIDECTOMY  1930's  . TOTAL KNEE ARTHROPLASTY Right 2001    Social History:  Social History   Socioeconomic History  . Marital status: Married    Spouse Blake: Not on file  . Number of children: 2  . Years of education: Not on file  . Highest education level: Not on file  Occupational History  . Occupation: RETIRED    Employer: RETIRED    Comment: [employer]  . Financial  resource strain: Not hard at all  . Food insecurity:    Worry: Never true    Inability: Never true  . Transportation needs:    Medical: No    Non-medical: No  Tobacco Use  . Smoking status: Never Smoker  . Smokeless tobacco: Never Used  Substance and Sexual Activity  . Alcohol use: Yes    Alcohol/week: 4.2 oz    Types: 7 Glasses of wine per week    Comment: occasionally  . Drug use: No  . Sexual activity: Not Currently    Partners: Male  Lifestyle  . Physical activity:    Days per week: 7 days    Minutes per session: 30 min  . Stress: Not at all  Relationships  . Social connections:    Talks on phone: More than three times a week    Gets together: More than three times a week    Attends religious service: More than 4 times  per year    Active member of club or organization: Yes    Attends meetings of clubs or organizations: More than 4 times per year    Relationship status: Married  . Intimate partner violence:    Fear of current or ex partner: No    Emotionally abused: No    Physically abused: No    Forced sexual activity: No  Other Topics Concern  . Not on file  Social History Narrative   Diet? Low salt, low fat      Do you drink/eat things with caffeine? yes      Marital status?                 married                   What year were you married? 1949      Do you live in a house, apartment, assisted living, condo, trailer, etc.? apartment      Is it one or more stories? 3 stories      How many persons live in your home? Just me      Do you have any pets in your home? (please list) no      Current or past profession: accounting      Do you exercise?          yes                            Type & how often? Walk, class, prescribed daily      Do you have a living will? yes      Do you have a DNR form?     yes                             If not, do you want to discuss one?      Do you have signed POA/HPOA for forms? no          Family History:  Family  History  Problem Relation Age of Onset  . Malignant hypertension Father   . Hypertension Father   . Renal Disease Father   . Breast cancer Mother   . Heart attack Brother   . Stroke Brother     Medications:   Current Outpatient Medications on File Prior to Visit  Medication Sig Dispense Refill  . albuterol (PROVENTIL HFA;VENTOLIN HFA) 108 (90 Base) MCG/ACT inhaler Inhale 2 puffs into the lungs every 6 (six) hours as needed for wheezing or shortness of breath. 1 Inhaler 12  . amLODipine (NORVASC) 5 MG tablet Take 1 tablet (5 mg total) by mouth daily. 90 tablet 3  . atorvastatin (LIPITOR) 40 MG tablet Take 1 tablet (40 mg total) by mouth daily at 6 PM. 30 tablet 0  . azelastine (ASTELIN) 0.1 % nasal spray Place 1-2 sprays into both nostrils at bedtime. Use in each nostril as directed    . Biotin 1 MG CAPS Take 1 mg by mouth daily.     . budesonide-formoterol (SYMBICORT) 160-4.5 MCG/ACT inhaler Inhale 2 puffs into the lungs 2 (two) times daily. 3 Inhaler 2  . Cholecalciferol (VITAMIN D) 2000 units CAPS Take 1 capsule by mouth daily.    . furosemide (LASIX) 40 MG tablet TAKE 1 TABLET BY MOUTH  DAILY 90 tablet 1  . latanoprost (XALATAN) 0.005 % ophthalmic solution Place 1 drop into both  eyes at bedtime.    [name] levothyroxine (SYNTHROID, LEVOTHROID) 88 MCG tablet TAKE 1 TABLET BY MOUTH  DAILY BEFORE BREAKFAST 90 tablet 3  . losartan (COZAAR) 100 MG tablet Take 1.5 tablets (150 mg total) by mouth at bedtime. Take One and a Half tablet (150mg ) by mouth once daily At noontime    . LUTEIN PO Take 25 mg by mouth daily.     . Magnesium 250 MG TABS Take 1 tablet by mouth daily.    . metoprolol succinate (TOPROL-XL) 50 MG 24 hr tablet TAKE 1 TABLET BY MOUTH  EVERY EVENING. TAKE WITH OR IMMEDIATELY FOLLOWING A  MEAL. 90 tablet 0  . montelukast (SINGULAIR) 10 MG tablet TAKE 1 TABLET BY MOUTH  DAILY 90 tablet 0  . Multiple Vitamins-Minerals (PRESERVISION AREDS 2) CAPS Take 1 capsule by mouth daily.    .  potassium chloride SA (K-DUR,KLOR-CON) 20 MEQ tablet Take 20 mEq by mouth daily.     . vitamin B-12 (CYANOCOBALAMIN) 1000 MCG tablet Take 1,000 mcg by mouth daily.    Alveda Reasons 15 MG TABS tablet TAKE 1 TABLET BY MOUTH  DAILY WITH SUPPER 90 tablet 1   No current facility-administered medications on file prior to visit.     Allergies:   Allergies  Allergen Reactions  . Brimonidine Tartrate-Timolol Other (See Comments)    REACTION: systemic malaise amigen eye drop  . Penicillins Hives  . Breo Ellipta [Fluticasone Furoate-Vilanterol] Other (See Comments)    Ran blood pressure up Increased blood pressure     Physical Exam  Vitals:   08/06/17 1119  BP: (!) 143/86  Pulse: 80  Weight: 133 lb 12.8 oz (60.7 kg)  Height: 5\' 4"  (1.626 m)   Body mass index is 22.97 kg/m. No exam data present  General: Frail elderly pleasant Caucasian female, seated, in no evident distress Head: head normocephalic and atraumatic.   Neck: supple with no carotid or supraclavicular bruits Cardiovascular: regular rate and rhythm, no murmurs Musculoskeletal: no deformity Skin:  no rash/petichiae Vascular:  Normal pulses all extremities  Neurologic Exam Mental Status: Awake and fully alert. Oriented to place and time. Recent and remote memory intact. Attention span, concentration and fund of knowledge appropriate. Mood and affect appropriate.  Cranial Nerves: Fundoscopic exam reveals sharp disc margins. Pupils equal, briskly reactive to light. Extraocular movements full without nystagmus. Visual fields full to confrontation. Hearing intact. Facial sensation intact. Face, tongue, palate moves normally and symmetrically.  Motor: Normal bulk and tone. Normal strength in all tested extremity muscles. Sensory.: intact to touch , pinprick , position and vibratory sensation.  Coordination: Rapid alternating movements normal in all extremities. Finger-to-nose and heel-to-shin performed accurately bilaterally. Gait  and Station: Arises from chair without difficulty. Stance is normal. Gait demonstrates normal stride length and balance . Able to heel, toe and tandem walk without difficulty.  Reflexes: 1+ and symmetric. Toes downgoing.    NIHSS  0 Modified Rankin  1 HAS-BLED 2 CHA2DS2-VASc 6   Diagnostic Data (Labs, Imaging, Testing)  CT HEAD WO CONTRAST 06/24/17 IMPRESSION: 1. Stable atrophy with moderate degree of chronic small vessel ischemic disease. No acute intracranial abnormality identified. 2. Chronic partial opacification of the right mastoid likely representing a tiny effusion.  2D ECHOCARDIOGRAM 06/25/17 Study Conclusions - Left ventricle: The cavity size was normal. Wall thickness was   increased in a pattern of moderate LVH. Systolic function was   normal. The estimated ejection fraction was in the range of 60%   to 65%.  Wall motion was normal; there were no regional wall   motion abnormalities. - Aortic valve: Moderately calcified annulus. Moderately thickened,   moderately calcified leaflets. There was mild to moderate   stenosis. Valve area (VTI): 1.02 cm^2. Valve area (Vmax): 0.94   cm^2. Valve area (Vmean): 0.9 cm^2. - Mitral valve: There was mild regurgitation. - Left atrium: The atrium was severely dilated. - Right atrium: The atrium was moderately dilated. - Tricuspid valve: There was moderate regurgitation. - Pulmonary arteries: Systolic pressure was moderately increased.   PA peak pressure: 43 mm Hg (S).  US CAROTID BILATERAL 06/25/17 Final Interpretation: Right Carotid: Velocities in the right ICA are consistent with a 1-39% stenosis. Left Carotid: Velocities in the left ICA are consistent with a 1-39% stenosis. Vertebrals: Unable to visualize right vertebral and no flow detected in left vertebral artery.  [place] TRANSCRANIAL DOPPLER 06/25/17 Final Interpretation: Normal mean flow velocities in majority of identified vessels of anterior and posterior circulation  except damped left vertebral artery waveform suggests distal stenosis versus occlusion. Globally elevated pulsatility indexes suggest diffuse intracranial atherosclerosis likely    ASSESSMENT: Jody Blake is a 84 y.o. year old female here with TIA on 06/24/17 secondary to cardioembolic. Vascular risk factors include HTN, HLD and a fib on Xarelto.     PLAN: -Continue Xarelto (rivaroxaban) daily  and lipitor  for secondary stroke prevention -Start gabapentin 100 mg nightly for nerve pain -F/u with PCP regarding your HTN and HLD management -F/u with cardiologist for atrial fibrillation and xarelto management -continue to monitor BP at home -Maintain strict control of hypertension with blood pressure goal below 130/90, diabetes with hemoglobin A1c goal below 6.5% and cholesterol with LDL cholesterol (bad cholesterol) goal below 70 mg/dL. I also advised the patient to eat a healthy diet with plenty of whole grains, cereals, fruits and vegetables, exercise regularly and maintain ideal body weight.  Follow up in 2 months or call earlier if needed  Greater than 50% time during this 25 minute consultation visit was spent on counseling and coordination of care about HTN, HLD and a. fib, discussion about risk benefit of anticoagulation and answering questions.   Venancio Poisson, AGNP-BC  [facility] [address] [place], [place] [address]  Phone [phone] Fax [phone]

## 2017-08-06 ENCOUNTER — Encounter: Payer: Self-pay | Admitting: Adult Health

## 2017-08-06 ENCOUNTER — Ambulatory Visit (INDEPENDENT_AMBULATORY_CARE_PROVIDER_SITE_OTHER): Payer: Medicare Other | Admitting: Adult Health

## 2017-08-06 VITALS — BP 143/86 | HR 80 | Ht 64.0 in | Wt 133.8 lb

## 2017-08-06 DIAGNOSIS — G459 Transient cerebral ischemic attack, unspecified: Secondary | ICD-10-CM

## 2017-08-06 DIAGNOSIS — E785 Hyperlipidemia, unspecified: Secondary | ICD-10-CM

## 2017-08-06 DIAGNOSIS — I10 Essential (primary) hypertension: Secondary | ICD-10-CM | POA: Diagnosis not present

## 2017-08-06 DIAGNOSIS — M792 Neuralgia and neuritis, unspecified: Secondary | ICD-10-CM | POA: Diagnosis not present

## 2017-08-06 DIAGNOSIS — I639 Cerebral infarction, unspecified: Secondary | ICD-10-CM | POA: Diagnosis not present

## 2017-08-06 MED ORDER — GABAPENTIN 100 MG PO CAPS: 100.0000 mg | ORAL_CAPSULE | Freq: Every day | ORAL | 0 refills | Status: DC

## 2017-08-06 MED ORDER — GABAPENTIN 100 MG PO CAPS: 100.0000 mg | ORAL_CAPSULE | Freq: Every day | ORAL | 2 refills | Status: DC

## 2017-08-06 NOTE — Patient Instructions (Signed)
Continue Xarelto (rivaroxaban) daily  and lipitor  for secondary stroke prevention  Gabapentin 100mg  daily at night to help with nerve pain  Continue to follow up with PCP regarding cholesterol and blood pressure management - they will continue to prescribe medications   Continue to follow up with cardiologist regarding atrial fibrillation and Xarelto management  Continue to eat healthy and stay active  Continue to monitor blood pressure at home  Maintain strict control of hypertension with blood pressure goal below 130/90, diabetes with hemoglobin A1c goal below 6.5% and cholesterol with LDL cholesterol (bad cholesterol) goal below 70 mg/dL. I also advised the patient to eat a healthy diet with plenty of whole grains, cereals, fruits and vegetables, exercise regularly and maintain ideal body weight.  Followup in the future with me in 2 months or call earlier if needed

## 2017-08-08 NOTE — Progress Notes (Signed)
I reviewed above note and agree with the assessment and plan.   Rosalin Hawking, MD PhD Stroke Neurology 08/08/2017 4:55 PM

## 2017-08-12 ENCOUNTER — Ambulatory Visit: Payer: Medicare Other | Admitting: Internal Medicine

## 2017-08-22 DIAGNOSIS — M25511 Pain in right shoulder: Secondary | ICD-10-CM | POA: Diagnosis not present

## 2017-08-22 DIAGNOSIS — M19011 Primary osteoarthritis, right shoulder: Secondary | ICD-10-CM | POA: Insufficient documentation

## 2017-08-28 DIAGNOSIS — L821 Other seborrheic keratosis: Secondary | ICD-10-CM | POA: Diagnosis not present

## 2017-08-28 DIAGNOSIS — L400 Psoriasis vulgaris: Secondary | ICD-10-CM | POA: Diagnosis not present

## 2017-08-28 DIAGNOSIS — Z85828 Personal history of other malignant neoplasm of skin: Secondary | ICD-10-CM | POA: Diagnosis not present

## 2017-09-02 ENCOUNTER — Telehealth: Payer: Self-pay

## 2017-09-02 ENCOUNTER — Ambulatory Visit (INDEPENDENT_AMBULATORY_CARE_PROVIDER_SITE_OTHER): Payer: Medicare Other | Admitting: *Deleted

## 2017-09-02 DIAGNOSIS — I442 Atrioventricular block, complete: Secondary | ICD-10-CM | POA: Diagnosis not present

## 2017-09-02 NOTE — Telephone Encounter (Signed)
LMOVM reminding pt to send remote transmission.   

## 2017-09-03 ENCOUNTER — Other Ambulatory Visit: Payer: Self-pay | Admitting: Internal Medicine

## 2017-09-03 NOTE — Progress Notes (Signed)
Electrophysiology Office Note Date: 09/08/2017  ID:  Jody Blake, DOB [birth year], MRN [id number]  PCP: Gayland Curry, DO Primary Cardiologist: Marlou Porch Electrophysiologist: Allred  CC: Pacemaker follow-up  Jody Blake is a 84 y.o. female seen today for Dr [name].  She presents today for routine electrophysiology followup.  Since last being seen in our clinic, the patient reports doing reasonably well.  She lives at [place] in an apartment. Her husband is in SNF with Alzheimers. She walks to see him everyday.  She thinks he still knows who she is.  Some days she notices increased shortness of breath when walking to visit.  She had a TIA in March of this year and has been seen by Dr Phoebe Sharps team in follow up. She asks today about need to take ASA and [place].  Review of neurology notes do not recommend ASA and [place].   She denies chest pain, palpitations, PND, orthopnea, nausea, vomiting, dizziness, syncope, edema, weight gain, or early satiety.  Device History: MDT dual chamber PPM implanted 2001 for complete heart block; gen change 2009; gen change 2016; AVN ablation 2016  Past Medical History:  Diagnosis Date  . Anemia   . Aortic stenosis    a. Echo 09/06/12 EF 55-60%, no WMA, G2DD, Ao valve sclerosis w/ mod stenosis, LA mildly dilated, PA pressure 61mmHg  . Asthmatic bronchitis   . Complete heart block [facility])    s/p permanent pacemaker 06/27/1999 (Battery change 06/2007 and 2016).  s/p AV nodal ablation by Dr [name] 2016.  [name] COPD (chronic obstructive pulmonary disease) (HCC)    Dr. [name]  . DDD (degenerative disc disease)   . Diastolic dysfunction    a. Echo 09/06/12 EF 55-60%, no WMA, G2DD, Ao valve sclerosis w/ mod stenosis, LA mildly dilated, PA pressure 27mmHg  . Diverticulosis   . DVT (deep vein thrombosis) in pregnancy [facility]) 1954   a. LLE  . Hypertension   . Hypothyroidism    on medication  . Macular degeneration    Dr. [name]  . Osteoarthrosis, unspecified whether generalized  or localized, other specified sites   . PAF (paroxysmal atrial fibrillation) (HCC)    Stopped flecainide, on amiodarone but still has bouts of A FIB (mostly in mornings).   . Pure hypercholesterolemia   . Staghorn calculus    Left   Past Surgical History:  Procedure Laterality Date  . APPENDECTOMY  ~ 1941  . AV NODE ABLATION  05/10/2014  . AV NODE ABLATION N/A 05/10/2014   Procedure: AV NODE ABLATION;  Surgeon: Thompson Grayer, MD;  Location: [facility] CATH LAB;  Service: Cardiovascular;  Laterality: N/A;  . BUNIONECTOMY WITH HAMMERTOE RECONSTRUCTION Bilateral ~ 1990  . CARDIAC PACEMAKER PLACEMENT  06/27/99   Medtronic PM implanted by Dr [name]  . CARDIOVERSION N/A 12/02/2013   Procedure: CARDIOVERSION;  Surgeon: [name], MD;  Location: [facility] ENDOSCOPY;  Service: Cardiovascular;  Laterality: N/A;  . CATARACT EXTRACTION W/ INTRAOCULAR LENS  IMPLANT, BILATERAL Bilateral   . COLONOSCOPY WITH PROPOFOL N/A 04/22/2013   Procedure: COLONOSCOPY WITH PROPOFOL;  Surgeon: [name], MD;  Location: [facility] ENDOSCOPY;  Service: Endoscopy;  Laterality: N/A;  . DILATION AND CURETTAGE OF UTERUS  X 2   "when I was going thru menopause"  . ESOPHAGOGASTRODUODENOSCOPY (EGD) WITH PROPOFOL N/A 04/22/2013   Procedure: ESOPHAGOGASTRODUODENOSCOPY (EGD) WITH PROPOFOL;  Surgeon: [name], MD;  Location: [facility] ENDOSCOPY;  Service: Endoscopy;  Laterality: N/A;  . HERNIA REPAIR    . INCISIONAL  HERNIA REPAIR    . INSERT / REPLACE / REMOVE PACEMAKER  06/2007   "took out the old; put in new"  . INSERT / REPLACE / REMOVE PACEMAKER  05/10/2014   MDT PPM generator change by Dr [name]  . JOINT REPLACEMENT    . PARTIAL NEPHRECTOMY Left 05/1974   stone disease  . PERMANENT PACEMAKER GENERATOR CHANGE N/A 05/10/2014   Procedure: PERMANENT PACEMAKER GENERATOR CHANGE;  Surgeon: Thompson Grayer, MD;  Location: [facility] CATH LAB;  Service: Cardiovascular;  Laterality: N/A;  . TONSILLECTOMY AND ADENOIDECTOMY  1930's  . TOTAL KNEE ARTHROPLASTY  Right 2001    Current Outpatient Medications  Medication Sig Dispense Refill  . albuterol (PROVENTIL HFA;VENTOLIN HFA) 108 (90 Base) MCG/ACT inhaler Inhale 2 puffs into the lungs every 6 (six) hours as needed for wheezing or shortness of breath. 1 Inhaler 12  . amLODipine (NORVASC) 5 MG tablet Take 1 tablet (5 mg total) by mouth daily. 90 tablet 3  . atorvastatin (LIPITOR) 40 MG tablet TAKE 1 TABLET ONCE DAILY AT 6 PM. 30 tablet 3  . azelastine (ASTELIN) 0.1 % nasal spray Place 1-2 sprays into both nostrils at bedtime. Use in each nostril as directed    . Biotin 5000 MCG CAPS Take 5,000 mg by mouth daily.     . budesonide-formoterol (SYMBICORT) 160-4.5 MCG/ACT inhaler Inhale 2 puffs into the lungs 2 (two) times daily. 3 Inhaler 2  . Cholecalciferol (VITAMIN D) 2000 units CAPS Take 1 capsule by mouth daily.    . furosemide (LASIX) 40 MG tablet TAKE 1 TABLET BY MOUTH  DAILY 90 tablet 1  . gabapentin (NEURONTIN) 100 MG capsule Take 1 capsule (100 mg total) by mouth at bedtime. 30 capsule 2  . latanoprost (XALATAN) 0.005 % ophthalmic solution Place 1 drop into both eyes at bedtime.    [name] levothyroxine (SYNTHROID, LEVOTHROID) 88 MCG tablet TAKE 1 TABLET BY MOUTH  DAILY BEFORE BREAKFAST 90 tablet 3  . losartan (COZAAR) 100 MG tablet Take 1.5 tablets (150 mg total) by mouth at bedtime. Take One and a Half tablet (150mg ) by mouth once daily At noontime    . LUTEIN PO Take 25 mg by mouth daily.     . Magnesium 250 MG TABS Take 1 tablet by mouth daily.    . metoprolol succinate (TOPROL-XL) 50 MG 24 hr tablet TAKE 1 TABLET BY MOUTH  EVERY EVENING. TAKE WITH OR IMMEDIATELY FOLLOWING A  MEAL. 90 tablet 0  . montelukast (SINGULAIR) 10 MG tablet TAKE 1 TABLET BY MOUTH  DAILY 90 tablet 0  . Multiple Vitamins-Minerals (PRESERVISION AREDS 2) CAPS Take 1 capsule by mouth daily.    . potassium chloride SA (K-DUR,KLOR-CON) 20 MEQ tablet Take 20 mEq by mouth daily.     . vitamin B-12 (CYANOCOBALAMIN) 1000 MCG tablet  Take 1,000 mcg by mouth daily.    Jody Blake 15 MG TABS tablet TAKE 1 TABLET BY MOUTH  DAILY WITH SUPPER 90 tablet 2   No current facility-administered medications for this visit.     Allergies:   Brimonidine tartrate-timolol; Penicillins; and Breo ellipta [fluticasone furoate-vilanterol]   Social History: Social History   Socioeconomic History  . Marital status: Married    Spouse name: Not on file  . Number of children: 2  . Years of education: Not on file  . Highest education level: Not on file  Occupational History  . Occupation: RETIRED    Employer: RETIRED    Comment: [employer]  .  Financial resource strain: Not hard at all  . Food insecurity:    Worry: Never true    Inability: Never true  . Transportation needs:    Medical: No    Non-medical: No  Tobacco Use  . Smoking status: Never Smoker  . Smokeless tobacco: Never Used  Substance and Sexual Activity  . Alcohol use: Yes    Alcohol/week: 4.2 oz    Types: 7 Glasses of wine per week    Comment: occasionally  . Drug use: No  . Sexual activity: Not Currently    Partners: Male  Lifestyle  . Physical activity:    Days per week: 7 days    Minutes per session: 30 min  . Stress: Not at all  Relationships  . Social connections:    Talks on phone: More than three times a week    Gets together: More than three times a week    Attends religious service: More than 4 times per year    Active member of club or organization: Yes    Attends meetings of clubs or organizations: More than 4 times per year    Relationship status: Married  . Intimate partner violence:    Fear of current or ex partner: No    Emotionally abused: No    Physically abused: No    Forced sexual activity: No  Other Topics Concern  . Not on file  Social History Narrative   Diet? Low salt, low fat      Do you drink/eat things with caffeine? yes      Marital status?                 married                   What year were  you married? 1949      Do you live in a house, apartment, assisted living, condo, trailer, etc.? apartment      Is it one or more stories? 3 stories      How many persons live in your home? Just me      Do you have any pets in your home? (please list) no      Current or past profession: accounting      Do you exercise?          yes                            Type & how often? Walk, class, prescribed daily      Do you have a living will? yes      Do you have a DNR form?     yes                             If not, do you want to discuss one?      Do you have signed POA/HPOA for forms? no          Family History: Family History  Problem Relation Age of Onset  . Malignant hypertension Father   . Hypertension Father   . Renal Disease Father   . Breast cancer Mother   . Heart attack Brother   . Stroke Brother      Review of Systems: All other systems reviewed and are otherwise negative except as noted above.   Physical Exam: VS:  BP 130/80   Pulse 75  Ht 5\' 4"  (1.626 m)   Wt 133 lb 9.6 oz (60.6 kg)   LMP 04/08/1974   SpO2 95%   BMI 22.93 kg/m  , BMI Body mass index is 22.93 kg/m.  GEN- The patient is elderly appearing, alert and oriented x 3 today.   HEENT: normocephalic, atraumatic; sclera clear, conjunctiva pink; hearing intact; oropharynx clear; neck supple  Lungs- Clear to ausculation bilaterally, normal work of breathing.  No wheezes, rales, rhonchi Heart- Regular rate and rhythm (paced) GI- soft, non-tender, non-distended, bowel sounds present  Extremities- no clubbing, cyanosis, or edema  MS- no significant deformity or atrophy Skin- warm and dry, no rash or lesion; PPM pocket well healed Psych- euthymic mood, full affect Neuro- strength and sensation are intact  PPM Interrogation- reviewed in detail today,  See PACEART report  EKG:  EKG is not ordered today.  Recent Labs: 11/14/2016: B Natriuretic Peptide 255.1 06/24/2017: ALT 17; Hemoglobin 13.6;  Platelets 264 06/25/2017: BUN 21; Creatinine, Ser 0.79; Potassium 3.6; Sodium 140   Wt Readings from Last 3 Encounters:  09/04/17 133 lb 9.6 oz (60.6 kg)  08/06/17 133 lb 12.8 oz (60.7 kg)  07/09/17 136 lb (61.7 kg)     Other studies Reviewed: Additional studies/ records that were reviewed today include: Dr Marlou Porch and Dr Jackalyn Lombard office notes   Assessment and Plan:  1.  Complete heart block Normal PPM function - pt device dependent See Pace Art report No changes today  2.  Permanent AF S/p AVN ablation Continue Xarelto for CHADS2VASC of 4  3.  HTN Stable No change required today  4.  Chronic diastolic heart failure Stable No change required today   Current medicines are reviewed at length with the patient today.   The patient does not have concerns regarding her medicines.  The following changes were made today:  none  Labs/ tests ordered today include: none Orders Placed This Encounter  Procedures  . CUP PACEART INCLINIC DEVICE CHECK     Disposition:   Follow up with [facility], Dr Marlou Porch as scheduled, Dr [name] 1 year     Signed, Chanetta Marshall, NP 09/08/2017 6:54 PM  [place] [address] [phone] (office) [phone] (fax)

## 2017-09-03 NOTE — Progress Notes (Signed)
Remote pacemaker transmission.   

## 2017-09-04 ENCOUNTER — Ambulatory Visit (INDEPENDENT_AMBULATORY_CARE_PROVIDER_SITE_OTHER): Payer: Medicare Other | Admitting: Nurse Practitioner

## 2017-09-04 ENCOUNTER — Encounter: Payer: Self-pay | Admitting: Nurse Practitioner

## 2017-09-04 ENCOUNTER — Other Ambulatory Visit: Payer: Self-pay | Admitting: Internal Medicine

## 2017-09-04 ENCOUNTER — Other Ambulatory Visit: Payer: Self-pay | Admitting: Cardiology

## 2017-09-04 VITALS — BP 130/80 | HR 75 | Ht 64.0 in | Wt 133.6 lb

## 2017-09-04 DIAGNOSIS — I482 Chronic atrial fibrillation: Secondary | ICD-10-CM

## 2017-09-04 DIAGNOSIS — I10 Essential (primary) hypertension: Secondary | ICD-10-CM

## 2017-09-04 DIAGNOSIS — I4821 Permanent atrial fibrillation: Secondary | ICD-10-CM

## 2017-09-04 DIAGNOSIS — I442 Atrioventricular block, complete: Secondary | ICD-10-CM | POA: Diagnosis not present

## 2017-09-04 DIAGNOSIS — I639 Cerebral infarction, unspecified: Secondary | ICD-10-CM

## 2017-09-04 DIAGNOSIS — I5032 Chronic diastolic (congestive) heart failure: Secondary | ICD-10-CM | POA: Diagnosis not present

## 2017-09-04 LAB — CUP PACEART INCLINIC DEVICE CHECK
Implantable Lead Implant Date: 20090318
Implantable Lead Location: 753859
Implantable Lead Location: 753860
Implantable Lead Model: 5076
Implantable Lead Model: 5092
Implantable Pulse Generator Implant Date: 20160202
MDC IDC LEAD IMPLANT DT: 20090318
MDC IDC SESS DTM: 20190530105842

## 2017-09-04 LAB — CUP PACEART REMOTE DEVICE CHECK
Battery Remaining Longevity: 136 mo
Brady Statistic RV Percent Paced: 99 %
Implantable Lead Implant Date: 20090318
Implantable Lead Location: 753859
Implantable Lead Location: 753860
Implantable Lead Model: 5076
Implantable Lead Model: 5092
Implantable Pulse Generator Implant Date: 20160202
Lead Channel Impedance Value: 682 Ohm
Lead Channel Pacing Threshold Amplitude: 1.25 V
Lead Channel Pacing Threshold Pulse Width: 0.4 ms
Lead Channel Setting Pacing Pulse Width: 0.4 ms
MDC IDC LEAD IMPLANT DT: 20090318
MDC IDC MSMT BATTERY IMPEDANCE: 158 Ohm
MDC IDC MSMT BATTERY VOLTAGE: 2.8 V
MDC IDC MSMT LEADCHNL RA IMPEDANCE VALUE: 67 Ohm
MDC IDC SESS DTM: 20190528203716
MDC IDC SET LEADCHNL RV PACING AMPLITUDE: 2.5 V
MDC IDC SET LEADCHNL RV SENSING SENSITIVITY: 4 mV

## 2017-09-04 NOTE — Patient Instructions (Addendum)
Medication Instructions:   Your physician recommends that you continue on your current medications as directed. Please refer to the Current Medication list given to you today.   If you need a refill on your cardiac medications before your next appointment, please call your pharmacy.  Labwork: NONE ORDERED  TODAY '   Testing/Procedures: NONE ORDERED  TODAY    Follow-Up: Your physician wants you to follow-up in: ONE YEAR WITH ALLRED You will receive a reminder letter in the mail two months in advance. If you don't receive a letter, please call our office to schedule the follow-up appointment.      Remote monitoring is used to monitor your Pacemaker of ICD from home. This monitoring reduces the number of office visits required to check your device to one time per year. It allows us to keep an eye on the functioning of your device to ensure it is working properly. You are scheduled for a device check from home on . 12-02-17... You may send your transmission at any time that day. If you have a wireless device, the transmission will be sent automatically. After your physician reviews your transmission, you will receive a postcard with your next transmission date.     Any Other Special Instructions Will Be Listed Below (If Applicable).                                                                                                                                                   

## 2017-09-05 ENCOUNTER — Encounter: Payer: Self-pay | Admitting: Cardiology

## 2017-09-05 NOTE — Telephone Encounter (Signed)
Xarelto 15mg  refill request received; pt is 84 yrs old, Crea-0.79 on 06/25/17, Wt-60.6kg, last seen by Chanetta Marshall on 09/04/17, CrCl-46.78ml/min; will send in requested refill.

## 2017-09-09 DIAGNOSIS — H04332 Acute lacrimal canaliculitis of left lacrimal passage: Secondary | ICD-10-CM | POA: Diagnosis not present

## 2017-09-12 DIAGNOSIS — H04332 Acute lacrimal canaliculitis of left lacrimal passage: Secondary | ICD-10-CM | POA: Diagnosis not present

## 2017-09-16 DIAGNOSIS — H04332 Acute lacrimal canaliculitis of left lacrimal passage: Secondary | ICD-10-CM | POA: Diagnosis not present

## 2017-09-16 DIAGNOSIS — H10022 Other mucopurulent conjunctivitis, left eye: Secondary | ICD-10-CM | POA: Diagnosis not present

## 2017-09-16 DIAGNOSIS — A499 Bacterial infection, unspecified: Secondary | ICD-10-CM | POA: Diagnosis not present

## 2017-09-16 DIAGNOSIS — H00035 Abscess of left lower eyelid: Secondary | ICD-10-CM | POA: Diagnosis not present

## 2017-09-16 DIAGNOSIS — H04542 Stenosis of left lacrimal canaliculi: Secondary | ICD-10-CM | POA: Diagnosis not present

## 2017-09-22 ENCOUNTER — Other Ambulatory Visit: Payer: Self-pay | Admitting: *Deleted

## 2017-09-22 MED ORDER — ATORVASTATIN CALCIUM 40 MG PO TABS: ORAL_TABLET | ORAL | 1 refills | Status: DC

## 2017-09-22 NOTE — Telephone Encounter (Signed)
Patient requested 

## 2017-09-29 DIAGNOSIS — H04549 Stenosis of unspecified lacrimal canaliculi: Secondary | ICD-10-CM | POA: Diagnosis not present

## 2017-09-29 DIAGNOSIS — H04542 Stenosis of left lacrimal canaliculi: Secondary | ICD-10-CM | POA: Diagnosis not present

## 2017-09-29 DIAGNOSIS — H04422 Chronic lacrimal canaliculitis of left lacrimal passage: Secondary | ICD-10-CM | POA: Diagnosis not present

## 2017-09-29 DIAGNOSIS — Z09 Encounter for follow-up examination after completed treatment for conditions other than malignant neoplasm: Secondary | ICD-10-CM | POA: Diagnosis not present

## 2017-10-01 ENCOUNTER — Telehealth: Payer: Self-pay | Admitting: *Deleted

## 2017-10-01 NOTE — Telephone Encounter (Signed)
Received fax prior authorization from [facility] [phone] for Cozaar 100mg . Placed in Dr. Serafina Mitchell for review. To be faxed back to [facility] ID: [id number]

## 2017-10-03 DIAGNOSIS — D485 Neoplasm of uncertain behavior of skin: Secondary | ICD-10-CM | POA: Diagnosis not present

## 2017-10-03 DIAGNOSIS — C44722 Squamous cell carcinoma of skin of right lower limb, including hip: Secondary | ICD-10-CM | POA: Diagnosis not present

## 2017-10-03 DIAGNOSIS — L400 Psoriasis vulgaris: Secondary | ICD-10-CM | POA: Diagnosis not present

## 2017-10-03 DIAGNOSIS — Z85828 Personal history of other malignant neoplasm of skin: Secondary | ICD-10-CM | POA: Diagnosis not present

## 2017-10-03 NOTE — Telephone Encounter (Signed)
[employer] called and requested more info. Answered questions and PA will probably be denied due to increased dosing of med. They advised if documentation was supported by the "Doses and admin" section of the package insert, the [place] or [employer], then it could be appealed for the higher dose and quantity. Proof of that would be required.

## 2017-10-03 NOTE — Telephone Encounter (Signed)
bp was not controlled with just 1 100mg  tablet so it was increased to 1.5 tablets after she had a TIA and bp has improved since.  Not sure what else they want.  Thanks Kim.

## 2017-10-08 ENCOUNTER — Other Ambulatory Visit: Payer: Medicare Other

## 2017-10-08 ENCOUNTER — Ambulatory Visit (INDEPENDENT_AMBULATORY_CARE_PROVIDER_SITE_OTHER): Payer: Medicare Other | Admitting: Internal Medicine

## 2017-10-08 ENCOUNTER — Ambulatory Visit (INDEPENDENT_AMBULATORY_CARE_PROVIDER_SITE_OTHER)
Admission: RE | Admit: 2017-10-08 | Discharge: 2017-10-08 | Disposition: A | Discharge status: Home / Self Care | Payer: Medicare Other | Source: Ambulatory Visit | Attending: Internal Medicine | Admitting: Internal Medicine

## 2017-10-08 ENCOUNTER — Encounter

## 2017-10-08 ENCOUNTER — Encounter: Payer: Self-pay | Admitting: Internal Medicine

## 2017-10-08 VITALS — BP 106/62 | HR 72 | Ht 63.0 in | Wt 130.0 lb

## 2017-10-08 DIAGNOSIS — J449 Chronic obstructive pulmonary disease, unspecified: Secondary | ICD-10-CM | POA: Diagnosis not present

## 2017-10-08 DIAGNOSIS — J3089 Other allergic rhinitis: Secondary | ICD-10-CM

## 2017-10-08 DIAGNOSIS — J302 Other seasonal allergic rhinitis: Secondary | ICD-10-CM

## 2017-10-08 DIAGNOSIS — R05 Cough: Secondary | ICD-10-CM | POA: Diagnosis not present

## 2017-10-08 DIAGNOSIS — I639 Cerebral infarction, unspecified: Secondary | ICD-10-CM

## 2017-10-08 NOTE — Assessment & Plan Note (Signed)
Nasal sprays have proved adequate and can be continued as needed.

## 2017-10-08 NOTE — Progress Notes (Signed)
Subjective:    Patient ID: Jody Blake, female    DOB: 09/06/1925, 84 y.o.   MRN: [id number]  HPI F never smoker, followed for allergic rhinitis, chronic bronchitis, complicated by GERD, Hx PAfib/ pacemaker. ------------------------------------------------------------------------------------------------ 06/05/2016- 84 year old female never smoker followed for Allergic rhinitis, chronic bronchitis, complicated by GERD, history AFib/pacemaker, glaucoma FOLLOWS FOR: Pt states she has been doing well; has slight cough in the mornings but usually stops quickly. Pt has runny nose all the time. Cough productive clear sputum. Says she feels pretty well for her age-not acutely ill and doesn't feel she has an infection. Mentions soreness/ache to the left of lumbar spine with history of partial left nephrectomy and repair of the incision. I asked her to mention this to her primary physician who may want to get a CT scan of the area. Continues Advair with only rare need for rescue inhaler.  10/08/2017- 84 year old female never smoker followed for Allergic rhinitis, COPD mixed type, complicated by GERD,  AFib/pacemaker, glaucoma/ macular degen, dCHF, AoStenosis, CKDIII,  -----Has been doing well,she is still coughing up mucus clear in color. No acute respiratory events this winter.  Stable dyspnea on exertion.  No change in chronic productive cough-usually white. Continues Symbicort which is worked well for her, infrequent need for rescue inhaler.  As a lasting nasal spray works well. Her primary concerns are diminished eyesight and hearing, being appropriately followed elsewhere.  ROS-see HPI   + = positive Constitutional:    weight loss, night sweats, fevers, chills, fatigue, lassitude. HEENT:    headaches, difficulty swallowing, tooth/dental problems, sore throat,       sneezing, itching, ear ache, nasal congestion, post nasal drip, snoring CV:    chest pain, orthopnea, PND, swelling in lower  extremities, anasarca,                                                    dizziness, palpitations Resp:   shortness of breath with exertion or at rest.                +productive cough,   +non-productive cough, coughing up of blood.              change in color of mucus.  +wheezing.   Skin:    rash or lesions. GI:  No-   heartburn, indigestion, abdominal pain, nausea, vomiting,  GU:  MS:   joint pain, stiffness,  Neuro-     nothing unusual Psych:  change in mood or affect.  depression or anxiety.   memory loss.  Objective:  OBJ- Physical Exam General- Alert, Oriented, Affect-appropriate, Distress- none acute Skin- rash-none, lesions- none, excoriation- none Lymphadenopathy- none Head- atraumatic            Eyes- Gross vision intact, PERRLA, conjunctivae and secretions clear            Ears- + Hard of hearing            Nose- Clear, no-Septal dev, mucus, polyps, erosion, perforation             Throat- Mallampati II , mucosa clear , drainage- none, tonsils- atrophic Neck- flexible , trachea midline, no stridor , thyroid nl, carotid no bruit Chest - symmetrical excursion , unlabored           Heart/CV- RRR , no murmur ,  no gallop  , no rub, nl s1 s2                           - JVD- none , edema- none, stasis changes- none, varices- none           Lung-  Wheeze-none, cough+slight , dullness -none, rub- none           Chest wall-+ left pacemaker.  Abd-   Br/ Gen/ Rectal- Not done, not indicated Extrem- cyanosis- none, clubbing, none, atrophy- none, strength- nl Neuro- grossly intact to observation    Assessment & Plan:

## 2017-10-08 NOTE — Assessment & Plan Note (Signed)
Stable chronic bronchitis pattern.  Meds are appropriate.  We are avoiding use of LAMAs because of her glaucoma.

## 2017-10-08 NOTE — Patient Instructions (Signed)
Order CXR     Dx COPD mixed type  Ok to continue current meds  Please call if we can help

## 2017-10-13 ENCOUNTER — Telehealth: Payer: Self-pay

## 2017-10-13 DIAGNOSIS — G459 Transient cerebral ischemic attack, unspecified: Secondary | ICD-10-CM

## 2017-10-13 DIAGNOSIS — I10 Essential (primary) hypertension: Secondary | ICD-10-CM

## 2017-10-13 NOTE — Telephone Encounter (Signed)
That's fine.  Her insurance doesn't want to cover the three tablets per day (she takes it bid).

## 2017-10-13 NOTE — Telephone Encounter (Signed)
Message left on clinical intake voicemail:   Patient would like to know if her losartan can be changed. Patient is currently taking 100 mg tablet 1 1/2 tablet daily and the tablet crumbles to pieces. Patient stated the tablet is tear-shaped and impossible to half.  Patient is requesting that medication be changed to 100 mg once daily and 50 mg tablet once daily for a total of 150 mg daily (2 different doses). This change will be easier for patient.  Please advise

## 2017-10-15 ENCOUNTER — Encounter: Payer: Self-pay | Admitting: Adult Health

## 2017-10-15 ENCOUNTER — Ambulatory Visit (INDEPENDENT_AMBULATORY_CARE_PROVIDER_SITE_OTHER): Payer: Medicare Other | Admitting: Adult Health

## 2017-10-15 VITALS — BP 150/88 | HR 83 | Ht 63.0 in | Wt 136.6 lb

## 2017-10-15 DIAGNOSIS — E785 Hyperlipidemia, unspecified: Secondary | ICD-10-CM | POA: Diagnosis not present

## 2017-10-15 DIAGNOSIS — G459 Transient cerebral ischemic attack, unspecified: Secondary | ICD-10-CM | POA: Diagnosis not present

## 2017-10-15 DIAGNOSIS — M792 Neuralgia and neuritis, unspecified: Secondary | ICD-10-CM

## 2017-10-15 DIAGNOSIS — I10 Essential (primary) hypertension: Secondary | ICD-10-CM | POA: Diagnosis not present

## 2017-10-15 MED ORDER — GABAPENTIN 100 MG PO CAPS: 100.0000 mg | ORAL_CAPSULE | Freq: Every day | ORAL | 4 refills | Status: DC

## 2017-10-15 NOTE — Patient Instructions (Addendum)
Continue Xarelto (rivaroxaban) daily  and lipitor  for secondary stroke prevention  Continue to follow up with PCP regarding cholesterol and blood pressure management   continue to follow up with cardiologist regarding atrial fibrillation and xarelto management  Continue to stay active and eat healthy  Continue to monitor blood pressure at home  Maintain strict control of hypertension with blood pressure goal below 130/90, diabetes with hemoglobin A1c goal below 6.5% and cholesterol with LDL cholesterol (bad cholesterol) goal below 70 mg/dL. I also advised the patient to eat a healthy diet with plenty of whole grains, cereals, fruits and vegetables, exercise regularly and maintain ideal body weight.  Followup in the future with me in 6 months or call earlier if needed         Thank you for coming to see [place] at [facility]. I hope we have been able to provide you high quality care today.  You may receive a patient satisfaction survey over the next few weeks. We would appreciate your feedback and comments so that we may continue to improve ourselves and the health of our patients.

## 2017-10-15 NOTE — Progress Notes (Signed)
I agree with the above plan 

## 2017-10-15 NOTE — Progress Notes (Signed)
[facility] [address] [place]. [place] [address] [phone]       OFFICE FOLLOW UP NOTE  Jody Blake Date of Birth:  1925/08/29 Medical Record Number:  [id number]   Reason for Referral:  hospital TIA follow up  CHIEF COMPLAINT:  Chief Complaint  Patient presents with  . Follow-up    TIA follow up room pt is alone, pt lives at [facility] facility    HPI: Jody Blake is being seen today for initial visit in the office for TIA on 06/24/17. History obtained from patient and chart review. Reviewed all radiology images and labs personally.  Jody Blake is a 84 y.o. female with history of heart block status post pacemaker, aortic stenosis, hypertension, A. fib on Xarelto who presented with transient left-sided weakness. This apparently occurred twice,once during dinner and then again after EMS arrived and it was witnessed by EMS. CT of the head showed stable atrophy with a moderate degree of chronic small vessel disease, but no acute intracranial abnormalities.  MRI was unable to be performed due to patient having pacemaker.  No CTA performed due to initial AKI on admission. Carotid ultrasound, transcranial Dopplers and echo were fairly unremarkable except poor visualization of the vertebral artery. Etiology of patient's TIAs likely cardioembolic so it was determined  not pursue further evaluation for occlusive posterior circulation disease.  Recommended to continue patient on atorvastatin 40 mg daily as well as Xarelto for secondary stroke prevention. Continued early rehab with physical therapy, occupational therapy and speech therapy. Patient discharged in stable condition.   Since discharge, patient has been doing well.  She continues to take Xarelto without side effects of bleeding or bruising.  Continues to take Lipitor without side effects of myalgias.  She stated after the hospital she was fatigue but this has been improving.  Blood pressure at today's  visit 143/86 but typically 130s/80s.  She does currently live in an apartment by herself as her husband is currently in SNF.  She continues to do all ADLs and IADLs independently.  Daughter did bring patient to appointment as she is unable to drive due to poor vision.  She does have complaints of the past few months bilateral fingertips numb but denies pain along with intermittent left arm numbness and at night experiences nerve pain.  Denies recent injury or trauma.  She does have a history of neck and back pain with degenerative disc disease.  Denies new or worsening stroke/TIA symptoms.  10/15/17 UPDATE: Patient returns today for follow-up.  She states overall she continues to do well from a stroke standpoint.  She has been having recent complaints of left eye issues where she did follow-up with a ocular surgeon who "probed my tear duct" and is currently on eyedrops.  She states that she will be having additional surgery on 12/18/2017 but is unable to state exactly what surgery this is for her eyes.  Patient does have a history of macular degeneration in which she is almost blind in her right eye.  She will also have a follow-up appointment with her current ophthalmologist next week.  Patient continues to take Xarelto without side effects of bleeding or bruising and is followed by cardiology in regard to atrial fibrillation Xarelto management.  Patient continues to take Lipitor without side effects of myalgias.  Blood pressure today mildly elevated at 150/88 but patient does monitor this at home and typically 120s/80s.  Continues to live independently where she is able to  complete all ADLs and IADLs.  Patient was previously started on gabapentin 100 mg nightly for nerve pain and per patient, this is given her relief without complaints of recent nerve pain.  Denies new or worsening stroke/TIA symptoms.   ROS:   14 system review of systems performed and negative with exception of loss of vision and hearing  loss  PMH:  Past Medical History:  Diagnosis Date  . Anemia   . Aortic stenosis    a. Echo 09/06/12 EF 55-60%, no WMA, G2DD, Ao valve sclerosis w/ mod stenosis, LA mildly dilated, PA pressure 19mmHg  . Asthmatic bronchitis   . Complete heart block [facility])    s/p permanent pacemaker 06/27/1999 (Battery change 06/2007 and 2016).  s/p AV nodal ablation by Dr [name] 2016.  [name] COPD (chronic obstructive pulmonary disease) (HCC)    Dr. [name]  . DDD (degenerative disc disease)   . Diastolic dysfunction    a. Echo 09/06/12 EF 55-60%, no WMA, G2DD, Ao valve sclerosis w/ mod stenosis, LA mildly dilated, PA pressure 33mmHg  . Diverticulosis   . DVT (deep vein thrombosis) in pregnancy [facility]) 1954   a. LLE  . Hypertension   . Hypothyroidism    on medication  . Macular degeneration    Dr. [name]  . Osteoarthrosis, unspecified whether generalized or localized, other specified sites   . PAF (paroxysmal atrial fibrillation) (HCC)    Stopped flecainide, on amiodarone but still has bouts of A FIB (mostly in mornings).   . Pure hypercholesterolemia   . Staghorn calculus    Left    PSH:  Past Surgical History:  Procedure Laterality Date  . APPENDECTOMY  ~ 1941  . AV NODE ABLATION  05/10/2014  . AV NODE ABLATION N/A 05/10/2014   Procedure: AV NODE ABLATION;  Surgeon: Thompson Grayer, MD;  Location: [facility] CATH LAB;  Service: Cardiovascular;  Laterality: N/A;  . BUNIONECTOMY WITH HAMMERTOE RECONSTRUCTION Bilateral ~ 1990  . CARDIAC PACEMAKER PLACEMENT  06/27/99   Medtronic PM implanted by Dr [name]  . CARDIOVERSION N/A 12/02/2013   Procedure: CARDIOVERSION;  Surgeon: [name], MD;  Location: [facility] ENDOSCOPY;  Service: Cardiovascular;  Laterality: N/A;  . CATARACT EXTRACTION W/ INTRAOCULAR LENS  IMPLANT, BILATERAL Bilateral   . COLONOSCOPY WITH PROPOFOL N/A 04/22/2013   Procedure: COLONOSCOPY WITH PROPOFOL;  Surgeon: [name], MD;  Location: [facility] ENDOSCOPY;  Service: Endoscopy;  Laterality: N/A;  . DILATION AND  CURETTAGE OF UTERUS  X 2   "when I was going thru menopause"  . ESOPHAGOGASTRODUODENOSCOPY (EGD) WITH PROPOFOL N/A 04/22/2013   Procedure: ESOPHAGOGASTRODUODENOSCOPY (EGD) WITH PROPOFOL;  Surgeon: [name], MD;  Location: [facility] ENDOSCOPY;  Service: Endoscopy;  Laterality: N/A;  . HERNIA REPAIR    . INCISIONAL HERNIA REPAIR    . INSERT / REPLACE / REMOVE PACEMAKER  06/2007   "took out the old; put in new"  . INSERT / REPLACE / REMOVE PACEMAKER  05/10/2014   MDT PPM generator change by Dr [name]  . JOINT REPLACEMENT    . PARTIAL NEPHRECTOMY Left 05/1974   stone disease  . PERMANENT PACEMAKER GENERATOR CHANGE N/A 05/10/2014   Procedure: PERMANENT PACEMAKER GENERATOR CHANGE;  Surgeon: Thompson Grayer, MD;  Location: [facility] CATH LAB;  Service: Cardiovascular;  Laterality: N/A;  . TONSILLECTOMY AND ADENOIDECTOMY  1930's  . TOTAL KNEE ARTHROPLASTY Right 2001    Social History:  Social History   Socioeconomic History  . Marital status: Married    Spouse name: Not on  file  . Number of children: 2  . Years of education: Not on file  . Highest education level: Not on file  Occupational History  . Occupation: RETIRED    Employer: RETIRED    Comment: [employer]  . Financial resource strain: Not hard at all  . Food insecurity:    Worry: Never true    Inability: Never true  . Transportation needs:    Medical: No    Non-medical: No  Tobacco Use  . Smoking status: Never Smoker  . Smokeless tobacco: Never Used  Substance and Sexual Activity  . Alcohol use: Yes    Alcohol/week: 4.2 oz    Types: 7 Glasses of wine per week    Comment: occasionally  . Drug use: No  . Sexual activity: Not Currently    Partners: Male  Lifestyle  . Physical activity:    Days per week: 7 days    Minutes per session: 30 min  . Stress: Not at all  Relationships  . Social connections:    Talks on phone: More than three times a week    Gets together: More than three times a week     Attends religious service: More than 4 times per year    Active member of club or organization: Yes    Attends meetings of clubs or organizations: More than 4 times per year    Relationship status: Married  . Intimate partner violence:    Fear of current or ex partner: No    Emotionally abused: No    Physically abused: No    Forced sexual activity: No  Other Topics Concern  . Not on file  Social History Narrative   Diet? Low salt, low fat      Do you drink/eat things with caffeine? yes      Marital status?                 married                   What year were you married? 1949      Do you live in a house, apartment, assisted living, condo, trailer, etc.? apartment      Is it one or more stories? 3 stories      How many persons live in your home? Just me      Do you have any pets in your home? (please list) no      Current or past profession: accounting      Do you exercise?          yes                            Type & how often? Walk, class, prescribed daily      Do you have a living will? yes      Do you have a DNR form?     yes                             If not, do you want to discuss one?      Do you have signed POA/HPOA for forms? no          Family History:  Family History  Problem Relation Age of Onset  . Malignant hypertension Father   . Hypertension Father   . Renal Disease Father   .  Breast cancer Mother   . Heart attack Brother   . Stroke Brother     Medications:   Current Outpatient Medications on File Prior to Visit  Medication Sig Dispense Refill  . albuterol (PROVENTIL HFA;VENTOLIN HFA) 108 (90 Base) MCG/ACT inhaler Inhale 2 puffs into the lungs every 6 (six) hours as needed for wheezing or shortness of breath. 1 Inhaler 12  . amLODipine (NORVASC) 5 MG tablet Take 1 tablet (5 mg total) by mouth daily. 90 tablet 3  . atorvastatin (LIPITOR) 40 MG tablet Take one tablet by mouth once daily for cholesterol 90 tablet 1  . azelastine (ASTELIN) 0.1  % nasal spray Place 1-2 sprays into both nostrils at bedtime. Use in each nostril as directed    . Biotin 5000 MCG CAPS Take 5,000 mg by mouth daily.     . budesonide-formoterol (SYMBICORT) 160-4.5 MCG/ACT inhaler Inhale 2 puffs into the lungs 2 (two) times daily. 3 Inhaler 2  . Cholecalciferol (VITAMIN D) 2000 units CAPS Take 1 capsule by mouth daily.    . furosemide (LASIX) 40 MG tablet TAKE 1 TABLET BY MOUTH  DAILY 90 tablet 1  . latanoprost (XALATAN) 0.005 % ophthalmic solution Place 1 drop into both eyes at bedtime.    [name] levothyroxine (SYNTHROID, LEVOTHROID) 88 MCG tablet TAKE 1 TABLET BY MOUTH  DAILY BEFORE BREAKFAST 90 tablet 3  . losartan (COZAAR) 100 MG tablet Take 1.5 tablets (150 mg total) by mouth at bedtime. Take One and a Half tablet (150mg ) by mouth once daily At noontime    . LUTEIN PO Take 25 mg by mouth daily.     . Magnesium 250 MG TABS Take 1 tablet by mouth daily.    . metoprolol succinate (TOPROL-XL) 50 MG 24 hr tablet TAKE 1 TABLET BY MOUTH  EVERY EVENING. TAKE WITH OR IMMEDIATELY FOLLOWING A  MEAL. 90 tablet 0  . montelukast (SINGULAIR) 10 MG tablet TAKE 1 TABLET BY MOUTH  DAILY 90 tablet 0  . Multiple Vitamins-Minerals (PRESERVISION AREDS 2) CAPS Take 1 capsule by mouth daily.    . potassium chloride SA (K-DUR,KLOR-CON) 20 MEQ tablet Take 20 mEq by mouth daily.     . Rivaroxaban (XARELTO) 15 MG TABS tablet Xarelto 15 mg tablet    . vitamin B-12 (CYANOCOBALAMIN) 1000 MCG tablet Take 1,000 mcg by mouth daily.     No current facility-administered medications on file prior to visit.     Allergies:   Allergies  Allergen Reactions  . Brimonidine Tartrate-Timolol Other (See Comments)    REACTION: systemic malaise amigen eye drop  . Penicillins Hives  . Breo Ellipta [Fluticasone Furoate-Vilanterol] Other (See Comments)    Ran blood pressure up Increased blood pressure     Physical Exam  Vitals:   10/15/17 1118  BP: (!) 150/88  Pulse: 83  Weight: 136 lb 9.6 oz  (62 kg)  Height: 5\' 3"  (1.6 m)   Body mass index is 24.2 kg/m. No exam data present  General: Frail elderly pleasant Caucasian female, seated, in no evident distress Head: head normocephalic and atraumatic.   Neck: supple with no carotid or supraclavicular bruits Cardiovascular: regular rate and rhythm, no murmurs Musculoskeletal: no deformity Skin:  no rash/petichiae Vascular:  Normal pulses all extremities  Neurologic Exam Mental Status: Awake and fully alert. Oriented to place and time. Recent and remote memory intact. Attention span, concentration and fund of knowledge appropriate. Mood and affect appropriate.  Cranial Nerves: Fundoscopic exam not performed due to eye pain.  Pupils equal, briskly reactive to light. Extraocular movements full without nystagmus. Visual fields full to confrontation.  HOH.  Facial sensation intact. Face, tongue, palate moves normally and symmetrically.  Motor: Normal bulk and tone. Normal strength in all tested extremity muscles. Sensory.: intact to touch , pinprick , position and vibratory sensation.  Coordination: Rapid alternating movements normal in all extremities. Finger-to-nose and heel-to-shin performed accurately bilaterally. Gait and Station: Arises from chair without difficulty. Stance is normal. Gait demonstrates normal stride length and balance .   Reflexes: 1+ and symmetric. Toes downgoing.     Diagnostic Data (Labs, Imaging, Testing)  CT HEAD WO CONTRAST 06/24/17 IMPRESSION: 1. Stable atrophy with moderate degree of chronic small vessel ischemic disease. No acute intracranial abnormality identified. 2. Chronic partial opacification of the right mastoid likely representing a tiny effusion.  2D ECHOCARDIOGRAM 06/25/17 Study Conclusions - Left ventricle: The cavity size was normal. Wall thickness was   increased in a pattern of moderate LVH. Systolic function was   normal. The estimated ejection fraction was in the range of 60%   to  65%. Wall motion was normal; there were no regional wall   motion abnormalities. - Aortic valve: Moderately calcified annulus. Moderately thickened,   moderately calcified leaflets. There was mild to moderate   stenosis. Valve area (VTI): 1.02 cm^2. Valve area (Vmax): 0.94   cm^2. Valve area (Vmean): 0.9 cm^2. - Mitral valve: There was mild regurgitation. - Left atrium: The atrium was severely dilated. - Right atrium: The atrium was moderately dilated. - Tricuspid valve: There was moderate regurgitation. - Pulmonary arteries: Systolic pressure was moderately increased.   PA peak pressure: 43 mm Hg (S).  US CAROTID BILATERAL 06/25/17 Final Interpretation: Right Carotid: Velocities in the right ICA are consistent with a 1-39% stenosis. Left Carotid: Velocities in the left ICA are consistent with a 1-39% stenosis. Vertebrals: Unable to visualize right vertebral and no flow detected in left vertebral artery.  [place] TRANSCRANIAL DOPPLER 06/25/17 Final Interpretation: Normal mean flow velocities in majority of identified vessels of anterior and posterior circulation except damped left vertebral artery waveform suggests distal stenosis versus occlusion. Globally elevated pulsatility indexes suggest diffuse intracranial atherosclerosis likely    ASSESSMENT: Jody Blake is a 84 y.o. year old female here with TIA on 06/24/17 secondary to cardioembolic. Vascular risk factors include HTN, HLD and a fib on Xarelto.     PLAN: -Continue Xarelto (rivaroxaban) daily  and lipitor  for secondary stroke prevention -F/u with PCP regarding your HTN and HLD management -F/u with cardiologist for atrial fibrillation and xarelto management -continue to monitor BP at home -Maintain strict control of hypertension with blood pressure goal below 130/90, diabetes with hemoglobin A1c goal below 6.5% and cholesterol with LDL cholesterol (bad cholesterol) goal below 70 mg/dL. I also advised the patient to eat a  healthy diet with plenty of whole grains, cereals, fruits and vegetables, exercise regularly and maintain ideal body weight.  Follow up in 6 months or call earlier if needed  Greater than 50% time during this 25 minute consultation visit was spent on counseling and coordination of care about HTN, HLD and a. fib, discussion about risk benefit of anticoagulation and answering questions.   Venancio Poisson, AGNP-BC  [facility] [address] [place], [place] [address]  Phone [phone] Fax [phone]

## 2017-10-20 MED ORDER — LOSARTAN POTASSIUM 50 MG PO TABS: 50.0000 mg | ORAL_TABLET | Freq: Two times a day (BID) | ORAL | 3 refills | Status: DC

## 2017-10-20 MED ORDER — LOSARTAN POTASSIUM 100 MG PO TABS: 100.0000 mg | ORAL_TABLET | Freq: Two times a day (BID) | ORAL | 3 refills | Status: DC

## 2017-10-20 NOTE — Telephone Encounter (Signed)
Medication ordered

## 2017-10-20 NOTE — Telephone Encounter (Signed)
Pended Rx and sent to Dr. Mariea Clonts for approval due to [place] Warning.

## 2017-10-21 DIAGNOSIS — H353122 Nonexudative age-related macular degeneration, left eye, intermediate dry stage: Secondary | ICD-10-CM | POA: Diagnosis not present

## 2017-10-21 DIAGNOSIS — Z961 Presence of intraocular lens: Secondary | ICD-10-CM | POA: Diagnosis not present

## 2017-10-21 DIAGNOSIS — H52203 Unspecified astigmatism, bilateral: Secondary | ICD-10-CM | POA: Diagnosis not present

## 2017-10-21 NOTE — Telephone Encounter (Signed)
Patient aware rx sent to pharmacy.  

## 2017-10-22 ENCOUNTER — Other Ambulatory Visit: Payer: Self-pay | Admitting: Internal Medicine

## 2017-10-22 DIAGNOSIS — I10 Essential (primary) hypertension: Secondary | ICD-10-CM

## 2017-10-22 DIAGNOSIS — G459 Transient cerebral ischemic attack, unspecified: Secondary | ICD-10-CM

## 2017-10-23 NOTE — Telephone Encounter (Signed)
I spoke with [facility] today and was told that the PA for losartan 100 mg TID has been approved. Patient has contacted the office since this PA was initiated and asked that the Rx be changed due having trouble breaking the 100 mg tablets in half. Patient's medication list was not changed today to reflect 100 mg TID since provider has recently changed Rx.

## 2017-10-23 NOTE — Telephone Encounter (Signed)
This encounter was created in error - please disregard.

## 2017-10-28 ENCOUNTER — Other Ambulatory Visit: Payer: Self-pay | Admitting: Internal Medicine

## 2017-10-28 DIAGNOSIS — I10 Essential (primary) hypertension: Secondary | ICD-10-CM

## 2017-10-28 DIAGNOSIS — G459 Transient cerebral ischemic attack, unspecified: Secondary | ICD-10-CM

## 2017-11-01 ENCOUNTER — Other Ambulatory Visit: Payer: Self-pay | Admitting: Internal Medicine

## 2017-11-04 ENCOUNTER — Encounter: Payer: Self-pay | Admitting: Internal Medicine

## 2017-11-04 DIAGNOSIS — I10 Essential (primary) hypertension: Secondary | ICD-10-CM | POA: Diagnosis not present

## 2017-11-04 DIAGNOSIS — E785 Hyperlipidemia, unspecified: Secondary | ICD-10-CM | POA: Diagnosis not present

## 2017-11-04 DIAGNOSIS — G459 Transient cerebral ischemic attack, unspecified: Secondary | ICD-10-CM | POA: Diagnosis not present

## 2017-11-04 LAB — BASIC METABOLIC PANEL
BUN: 19 (ref 4–21)
Creatinine: 0.8 (ref 0.5–1.1)
Glucose: 97
Potassium: 3.8 (ref 3.4–5.3)
Sodium: 141 (ref 137–147)

## 2017-11-04 LAB — HEPATIC FUNCTION PANEL
ALT: 24 (ref 7–35)
AST: 32 (ref 13–35)
Alkaline Phosphatase: 71 (ref 25–125)
Bilirubin, Total: 0.6

## 2017-11-04 LAB — CBC AND DIFFERENTIAL
HCT: 40 (ref 36–46)
Hemoglobin: 12.9 (ref 12.0–16.0)
Platelets: 250 (ref 150–399)
WBC: 6.2

## 2017-11-04 LAB — LIPID PANEL
Cholesterol: 138 (ref 0–200)
HDL: 70 (ref 35–70)
LDL Cholesterol: 58
Triglycerides: 53 (ref 40–160)

## 2017-11-11 ENCOUNTER — Telehealth: Payer: Self-pay | Admitting: Cardiology

## 2017-11-11 NOTE — Telephone Encounter (Signed)
° °  [facility] Pre-operative Risk Assessment    Request for surgical clearance:  1. What type of surgery is being performed? Left lower eyelid drainage of lacrimal canaliculus with reconstruction of apparatus and tube placement   2. When is this surgery scheduled? 11/17/2017   3. What type of clearance is required (medical clearance vs. Pharmacy clearance to hold med vs. Both)? Both   4. Are there any medications that need to be held prior to surgery and how long? Xarelto and how long to hold this medication.  5. Practice name and name of physician performing surgery? [facility], Dr. Lorina Rabon  6. What is your office phone number [phone]   7.   What is your office fax number [phone]  8.   Anesthesia type (None, local, MAC, general) ? MAC   Jody Blake 11/11/2017, 9:52 AM  _________________________________________________________________   (provider comments below)

## 2017-11-12 ENCOUNTER — Encounter: Payer: Self-pay | Admitting: Internal Medicine

## 2017-11-12 ENCOUNTER — Non-Acute Institutional Stay: Payer: Medicare Other | Admitting: Internal Medicine

## 2017-11-12 VITALS — BP 142/80 | HR 74 | Temp 97.7°F | Ht 63.0 in | Wt 134.0 lb

## 2017-11-12 DIAGNOSIS — I6503 Occlusion and stenosis of bilateral vertebral arteries: Secondary | ICD-10-CM

## 2017-11-12 DIAGNOSIS — E7800 Pure hypercholesterolemia, unspecified: Secondary | ICD-10-CM | POA: Diagnosis not present

## 2017-11-12 DIAGNOSIS — M4802 Spinal stenosis, cervical region: Secondary | ICD-10-CM | POA: Diagnosis not present

## 2017-11-12 DIAGNOSIS — I639 Cerebral infarction, unspecified: Secondary | ICD-10-CM

## 2017-11-12 DIAGNOSIS — E039 Hypothyroidism, unspecified: Secondary | ICD-10-CM | POA: Diagnosis not present

## 2017-11-12 DIAGNOSIS — J449 Chronic obstructive pulmonary disease, unspecified: Secondary | ICD-10-CM

## 2017-11-12 DIAGNOSIS — I10 Essential (primary) hypertension: Secondary | ICD-10-CM

## 2017-11-12 MED ORDER — LOSARTAN POTASSIUM 50 MG PO TABS: 50.0000 mg | ORAL_TABLET | Freq: Every day | ORAL | 3 refills | Status: DC

## 2017-11-12 MED ORDER — GABAPENTIN 100 MG PO CAPS: 100.0000 mg | ORAL_CAPSULE | Freq: Every day | ORAL | 3 refills | Status: DC

## 2017-11-12 MED ORDER — LOSARTAN POTASSIUM 100 MG PO TABS: 100.0000 mg | ORAL_TABLET | Freq: Every day | ORAL | 3 refills | Status: DC

## 2017-11-12 NOTE — Telephone Encounter (Signed)
84 yo female with hx of AFib, s/p AVN ablation with PPM implant due to uncontrolled HRs, prior TIAs, diastolic HF, aortic stenosis.  CHADS2-VASc=6 (age x 2, TIA x 2, female, HTN).  Echo 3/19:  EF 60-65, mild to mod AS (mean 17), mod TR, PASP 43.  I spoke with patient today.  Since last seen by Chanetta Marshall, NP in 08/2017, she has done well without chest pain, shortness of breath.  RCRI:  6.6% DASI:  4.31 METs  I will route to CVRR for recommendations regarding holding anticoagulation.  Richardson Dopp, PA-C    11/12/2017 4:40 PM

## 2017-11-12 NOTE — Progress Notes (Signed)
Location:  Occupational psychologist of Service:  Clinic (12)  Provider: Aurorah Schlachter L. Mariea Clonts, D.O., C.M.D.  Code Status: DNR Goals of Care:  Advanced Directives 11/12/2017  Does Patient Have a Medical Advance Directive? Yes  Type of Paramedic of [place];Out of facility DNR (pink MOST or yellow form)  Does patient want to make changes to medical advance directive? No - Patient declined  Copy of [place] in Chart? Yes  Would patient like information on creating a medical advance directive? -  Pre-existing out of facility DNR order (yellow form or pink MOST form) Yellow form placed in chart (order not valid for inpatient use)     Chief Complaint  Patient presents with  . Medical Management of Chronic Issues    86mth follow-up    HPI: Patient is a 84 y.o. female seen today for medical management of chronic diseases.    BP 142/80.  Pt is taking losartan 50mg  at noon and 100mg  at bedtime.  She is to be on 150mg  losartan bid since her hospitalization in March.  Insurance has been a big hassle getting this covered since the hospital docs made the change.  There have been probably a 1/2 dozen or more calls and forms completed about this.  Oddly, what she is doing seems to be fairly satisfactory for her.  BPs are running ok.  May also be due to inhaler change again.  When she had her last TIA, she was sent to neurology and she's been there twice.  She prescribed this b/c her arms were going to sleep at night.  It has helped her.  It's gabapentin 100mg  po qhs.   She's having a procedure at the surgical center on her eye.  Probing her tear duct didn't work so she needs it cut out and sewed up--Dr. Lorina Rabon.  Is having to use an ointment.  She's lost more vision and it's like there's a scum over it with the ointment.    No changes in balance.    She still coughs off and on.  Insurance was giving her trouble about using her symbicort.  It  was working fine so then she had to take several that made her bp go up.  Now they are back to covering her original symbicort.    Getting squamous cell ca biopsies and has had dressing needing changes.  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  Statin brought her LDL cholesterol down from 124 to 58.  She's had no side effects.  Past Medical History:  Diagnosis Date  . Anemia   . Aortic stenosis    a. Echo 09/06/12 EF 55-60%, no WMA, G2DD, Ao valve sclerosis w/ mod stenosis, LA mildly dilated, PA pressure 64mmHg  . Asthmatic bronchitis   . Complete heart block [facility])    s/p permanent pacemaker 06/27/1999 (Battery change 06/2007 and 2016).  s/p AV nodal ablation by Dr [name] 2016.  [name] COPD (chronic obstructive pulmonary disease) (HCC)    Dr. [name]  . DDD (degenerative disc disease)   . Diastolic dysfunction    a. Echo 09/06/12 EF 55-60%, no WMA, G2DD, Ao valve sclerosis w/ mod stenosis, LA mildly dilated, PA pressure 30mmHg  . Diverticulosis   . DVT (deep vein thrombosis) in pregnancy [facility]) 1954   a. LLE  . Hypertension   . Hypothyroidism    on medication  . Macular degeneration    Dr. [name]  . Osteoarthrosis, unspecified whether generalized or localized, other  specified sites   . PAF (paroxysmal atrial fibrillation) (HCC)    Stopped flecainide, on amiodarone but still has bouts of A FIB (mostly in mornings).   . Pure hypercholesterolemia   . Staghorn calculus    Left    Past Surgical History:  Procedure Laterality Date  . APPENDECTOMY  ~ 1941  . AV NODE ABLATION  05/10/2014  . AV NODE ABLATION N/A 05/10/2014   Procedure: AV NODE ABLATION;  Surgeon: Thompson Grayer, MD;  Location: [facility] CATH LAB;  Service: Cardiovascular;  Laterality: N/A;  . BUNIONECTOMY WITH HAMMERTOE RECONSTRUCTION Bilateral ~ 1990  . CARDIAC PACEMAKER PLACEMENT  06/27/99   Medtronic PM implanted by Dr [name]  . CARDIOVERSION N/A 12/02/2013   Procedure: CARDIOVERSION;  Surgeon: [name], MD;  Location: [facility] ENDOSCOPY;  Service:  Cardiovascular;  Laterality: N/A;  . CATARACT EXTRACTION W/ INTRAOCULAR LENS  IMPLANT, BILATERAL Bilateral   . COLONOSCOPY WITH PROPOFOL N/A 04/22/2013   Procedure: COLONOSCOPY WITH PROPOFOL;  Surgeon: [name], MD;  Location: [facility] ENDOSCOPY;  Service: Endoscopy;  Laterality: N/A;  . DILATION AND CURETTAGE OF UTERUS  X 2   "when I was going thru menopause"  . ESOPHAGOGASTRODUODENOSCOPY (EGD) WITH PROPOFOL N/A 04/22/2013   Procedure: ESOPHAGOGASTRODUODENOSCOPY (EGD) WITH PROPOFOL;  Surgeon: [name], MD;  Location: [facility] ENDOSCOPY;  Service: Endoscopy;  Laterality: N/A;  . HERNIA REPAIR    . INCISIONAL HERNIA REPAIR    . INSERT / REPLACE / REMOVE PACEMAKER  06/2007   "took out the old; put in new"  . INSERT / REPLACE / REMOVE PACEMAKER  05/10/2014   MDT PPM generator change by Dr [name]  . JOINT REPLACEMENT    . PARTIAL NEPHRECTOMY Left 05/1974   stone disease  . PERMANENT PACEMAKER GENERATOR CHANGE N/A 05/10/2014   Procedure: PERMANENT PACEMAKER GENERATOR CHANGE;  Surgeon: Thompson Grayer, MD;  Location: [facility] CATH LAB;  Service: Cardiovascular;  Laterality: N/A;  . TONSILLECTOMY AND ADENOIDECTOMY  1930's  . TOTAL KNEE ARTHROPLASTY Right 2001    Allergies  Allergen Reactions  . Brimonidine Tartrate-Timolol Other (See Comments)    REACTION: systemic malaise amigen eye drop  . Penicillins Hives  . Breo Ellipta [Fluticasone Furoate-Vilanterol] Other (See Comments)    Ran blood pressure up Increased blood pressure    Outpatient Encounter Medications as of 11/12/2017  Medication Sig  . albuterol (PROVENTIL HFA;VENTOLIN HFA) 108 (90 Base) MCG/ACT inhaler Inhale 2 puffs into the lungs every 6 (six) hours as needed for wheezing or shortness of breath.  [name] amLODipine (NORVASC) 5 MG tablet TAKE 1 TABLET BY MOUTH  DAILY  . atorvastatin (LIPITOR) 40 MG tablet Take one tablet by mouth once daily for cholesterol  . azelastine (ASTELIN) 0.1 % nasal spray Place 1-2 sprays into both nostrils at  bedtime. Use in each nostril as directed  . Biotin 5000 MCG CAPS Take 5,000 mg by mouth daily.   . budesonide-formoterol (SYMBICORT) 160-4.5 MCG/ACT inhaler Inhale 2 puffs into the lungs 2 (two) times daily.  . Cholecalciferol (VITAMIN D) 2000 units CAPS Take 1 capsule by mouth daily.  . furosemide (LASIX) 40 MG tablet TAKE 1 TABLET BY MOUTH  DAILY  . gabapentin (NEURONTIN) 100 MG capsule Take 1 capsule (100 mg total) by mouth at bedtime.  [name] latanoprost (XALATAN) 0.005 % ophthalmic solution Place 1 drop into both eyes at bedtime.  [name] levothyroxine (SYNTHROID, LEVOTHROID) 88 MCG tablet TAKE 1 TABLET BY MOUTH  DAILY BEFORE BREAKFAST  . losartan (COZAAR) 100 MG tablet  Take 1 tablet (100 mg total) by mouth 2 (two) times daily. Take with 50mg  pill  . losartan (COZAAR) 50 MG tablet Take 1 tablet (50 mg total) by mouth 2 (two) times daily. Take with 100mg  pill  . LUTEIN PO Take 25 mg by mouth daily.   . Magnesium 250 MG TABS Take 1 tablet by mouth daily.  . metoprolol succinate (TOPROL-XL) 50 MG 24 hr tablet Take 1 tablet (50 mg total) by mouth every evening.  . montelukast (SINGULAIR) 10 MG tablet TAKE 1 TABLET BY MOUTH  DAILY  . Multiple Vitamins-Minerals (PRESERVISION AREDS 2) CAPS Take 1 capsule by mouth daily.  . potassium chloride SA (K-DUR,KLOR-CON) 20 MEQ tablet Take 20 mEq by mouth daily.   . Rivaroxaban (XARELTO) 15 MG TABS tablet Xarelto 15 mg tablet  . vitamin B-12 (CYANOCOBALAMIN) 1000 MCG tablet Take 1,000 mcg by mouth daily.   No facility-administered encounter medications on file as of 11/12/2017.     Review of Systems:  Review of Systems  Constitutional: Negative for chills, fever and malaise/fatigue.  HENT: Positive for hearing loss. Negative for congestion.   Eyes: Positive for blurred vision.  Respiratory: Positive for cough and sputum production. Negative for shortness of breath and wheezing.   Cardiovascular: Positive for leg swelling. Negative for chest pain and palpitations.        Not able to wear compression hose due to pain in right lateral leg where she had skin cancer biopsies  Gastrointestinal: Negative for abdominal pain, blood in stool, constipation and melena.  Genitourinary: Negative for dysuria.  Musculoskeletal: Negative for falls.  Skin: Negative for itching and rash.  Neurological: Negative for dizziness and loss of consciousness.  Endo/Heme/Allergies: Bruises/bleeds easily.  Psychiatric/Behavioral: Negative for depression and memory loss. The patient is not nervous/anxious and does not have insomnia.        Sleeps well    Health Maintenance  Topic Date Due  . TETANUS/TDAP  02/15/1947  . DEXA SCAN  02/14/1993  . PNA vac Low Risk Adult (2 of 2 - PCV13) 11/24/2014  . INFLUENZA VACCINE  11/06/2017    Physical Exam: Vitals:   11/12/17 1057  BP: (!) 142/80  Pulse: 74  Temp: 97.7 F (36.5 C)  TempSrc: Oral  SpO2: 98%  Weight: 134 lb (60.8 kg)  Height: 5\' 3"  (1.6 m)   Body mass index is 23.74 kg/m. Physical Exam  Constitutional: She is oriented to person, place, and time. No distress.  Thin female, ambulates w/o assistive device  HENT:  Head: Normocephalic and atraumatic.  Eyes:  Glasses; left eye with blockage  Cardiovascular: Normal rate, regular rhythm, normal heart sounds and intact distal pulses.  Nonpitting edema of bilateral lower legs and ankles (no compression hose today) and two bandaids over biopsy sites; varicosities of feet and lower legs  Pulmonary/Chest: Effort normal and breath sounds normal. No respiratory distress.  Abdominal: Soft. Bowel sounds are normal.  Musculoskeletal: Normal range of motion.  Neurological: She is alert and oriented to person, place, and time.  Ataxic gait  Skin: Skin is warm and dry. Capillary refill takes less than 2 seconds.  Psychiatric: She has a normal mood and affect.    Labs reviewed: Basic Metabolic Panel: Recent Labs    11/15/16 0804 06/24/17 2102 06/24/17 2144  06/25/17 1044 11/04/17 0600  NA 141 137 140 140 141  K 3.4* 3.7 3.5 3.6 3.8  CL 104 103 103 105  --   CO2 29 24  --  27  --  GLUCOSE 103* 129* 129* 103*  --   BUN 14 34* 36* 21* 19  CREATININE 0.77 1.22* 1.20* 0.79 0.8  CALCIUM 9.3 9.5  --  9.0  --    Liver Function Tests: Recent Labs    11/14/16 1636 06/24/17 2102 11/04/17 0600  AST 29 34 32  ALT 16 17 24   ALKPHOS 66 67 71  BILITOT 0.7 0.4  --   PROT 7.2 6.5  --   ALBUMIN 4.2 3.8  --    No results for input(s): LIPASE, AMYLASE in the last 8760 hours. No results for input(s): AMMONIA in the last 8760 hours. CBC: Recent Labs    11/14/16 1636 06/24/17 2102 06/24/17 2144 11/04/17 0600  WBC 6.8 6.5  --  6.2  NEUTROABS 4.4 4.1  --   --   HGB 14.4 12.9 13.6 12.9  HCT 43.5 40.2 40.0 40  MCV 96.0 99.0  --   --   PLT 264 264  --  250   Lipid Panel: Recent Labs    11/15/16 0327 06/25/17 0229 11/04/17 0600  CHOL 205* 198 138  HDL 64 63 70  LDLCALC 126* 124* 58  TRIG 74 57 53  CHOLHDL 3.2 3.1  --    Lab Results  Component Value Date   HGBA1C 5.7 (H) 06/25/2017   Lab Results  Component Value Date   TSH 1.05 04/30/2016   Assessment/Plan 1. Essential hypertension -there has been a massive amount of confusion about her losartan between insurance and pharmacy and what pt is actually doing--the following is what she is actually taking:   - losartan (COZAAR) 100 MG tablet; Take 1 tablet (100 mg total) by mouth daily. At bedtime  Dispense: 90 tablet; Refill: 3 - losartan (COZAAR) 50 MG tablet; Take 1 tablet (50 mg total) by mouth daily. At noon  Dispense: 90 tablet; Refill: 3 -we opted to continue this as bp today was good for her  2. Vertebral artery occlusion, bilateral -cont statin which she tolerates and has reduced cholesterol, bp control, and exercise -not a candidate for surgical intervention with her advanced COPD, diastolic chf, and predisposition to renal failure--renal function normal on latest labs  3.  Degenerative cervical spinal stenosis -cont low dose gabapentin at bedtime initially prescribed by neurology  4. Pure hypercholesterolemia -cont statin therapy which has been effective and gotten her LDL to goal w/o side effects -hopefully will prevent more TIAs for her, but may be too late at this point in life (refused for years)  5. COPD mixed type ([place]) -cont symbicort, singulair and prn albuterol inhaler  6. Acquired hypothyroidism -cont current levothyroxine  Labs/tests ordered:  Need to check tsh after next visit Next appt:  4 mos med mgt  Charrie Mcconnon L. Jonnell Hentges, D.O. [facility] [address], [place] [address] Cell Phone (Mon-Fri 8am-5pm):  [phone] On Call:  [phone] & follow prompts after 5pm & weekends Office Phone:  [phone] Office Fax:  [phone]

## 2017-11-13 NOTE — Telephone Encounter (Signed)
Pt takes Xarelto for afib with CHADS2VASc score of 7 (age x2, sex, CHF - diastolic dysfunction, HTN, TIA in March 2019), also with history of DVT during pregnancy in 1954. CrCl is 42mL/min. Recommend only holding Xarelto for 24 hours prior to procedure if possible due to elevated cardiac risk.

## 2017-11-13 NOTE — Telephone Encounter (Signed)
   Primary Cardiologist:Mark Marlou Porch, MD  Chart reviewed as part of pre-operative protocol coverage. Pre-op clearance already addressed by colleagues in earlier phone notes. To summarize recommendations:  -84 yo female with hx of AFib, s/p AVN ablation with PPM implant due to uncontrolled HRs, prior TIAs, diastolic HF, aortic stenosis.  CHADS2-VASc=6 (age x 2, TIA x 2, female, HTN). Echo 3/19:  EF 60-65, mild to mod AS (mean 17), mod TR, PASP 43.  Richardson Dopp, PA spoke with patient.  Since last seen by Chanetta Marshall, NP in 08/2017, she has done well without chest pain, shortness of breath. RCRI:  6.6% DASI:  4.31 METs She is at moderate risk for surgery but This should be a low risk procedure and she should do OK. No planned further cardiac testing.   Per our pharmacy protocol:  Pt takes Xarelto for afib with CHADS2VASc score of 7 (age x2, sex, CHF - diastolic dysfunction, HTN, TIA in March 2019), also with history of DVT during pregnancy in 1954. CrCl is 68mL/min. Recommend only holding Xarelto for 24 hours prior to procedure if possible due to elevated cardiac risk.  Will route this bundled recommendation to requesting provider via Epic fax function. Please call with questions.  Daune Perch, NP 11/13/2017, 5:52 PM

## 2017-11-24 DIAGNOSIS — H10022 Other mucopurulent conjunctivitis, left eye: Secondary | ICD-10-CM | POA: Diagnosis not present

## 2017-11-24 DIAGNOSIS — H04542 Stenosis of left lacrimal canaliculi: Secondary | ICD-10-CM | POA: Diagnosis not present

## 2017-11-24 DIAGNOSIS — H04422 Chronic lacrimal canaliculitis of left lacrimal passage: Secondary | ICD-10-CM | POA: Diagnosis not present

## 2017-11-24 DIAGNOSIS — H00035 Abscess of left lower eyelid: Secondary | ICD-10-CM | POA: Diagnosis not present

## 2017-11-26 ENCOUNTER — Other Ambulatory Visit: Payer: Self-pay | Admitting: Internal Medicine

## 2017-11-28 ENCOUNTER — Other Ambulatory Visit: Payer: Self-pay | Admitting: Internal Medicine

## 2017-12-02 ENCOUNTER — Telehealth: Payer: Self-pay | Admitting: Cardiology

## 2017-12-02 ENCOUNTER — Ambulatory Visit (INDEPENDENT_AMBULATORY_CARE_PROVIDER_SITE_OTHER): Payer: Medicare Other | Admitting: *Deleted

## 2017-12-02 DIAGNOSIS — I442 Atrioventricular block, complete: Secondary | ICD-10-CM | POA: Diagnosis not present

## 2017-12-02 NOTE — Progress Notes (Signed)
Remote pacemaker transmission.   

## 2017-12-02 NOTE — Telephone Encounter (Signed)
Spoke with pt and reminded pt of remote transmission that is due today. Pt verbalized understanding.   

## 2017-12-04 ENCOUNTER — Encounter: Payer: Self-pay | Admitting: Cardiology

## 2017-12-04 DIAGNOSIS — Z85828 Personal history of other malignant neoplasm of skin: Secondary | ICD-10-CM | POA: Diagnosis not present

## 2017-12-04 DIAGNOSIS — B351 Tinea unguium: Secondary | ICD-10-CM | POA: Diagnosis not present

## 2017-12-09 DIAGNOSIS — H04542 Stenosis of left lacrimal canaliculi: Secondary | ICD-10-CM | POA: Diagnosis not present

## 2017-12-09 DIAGNOSIS — H04422 Chronic lacrimal canaliculitis of left lacrimal passage: Secondary | ICD-10-CM | POA: Diagnosis not present

## 2017-12-09 DIAGNOSIS — H10022 Other mucopurulent conjunctivitis, left eye: Secondary | ICD-10-CM | POA: Diagnosis not present

## 2017-12-09 DIAGNOSIS — H00035 Abscess of left lower eyelid: Secondary | ICD-10-CM | POA: Diagnosis not present

## 2017-12-09 DIAGNOSIS — Z09 Encounter for follow-up examination after completed treatment for conditions other than malignant neoplasm: Secondary | ICD-10-CM | POA: Diagnosis not present

## 2017-12-23 LAB — CUP PACEART REMOTE DEVICE CHECK
Battery Impedance: 158 Ohm
Brady Statistic RV Percent Paced: 100 %
Implantable Lead Implant Date: 20090318
Implantable Lead Location: 753860
Implantable Lead Model: 5092
Lead Channel Impedance Value: 67 Ohm
Lead Channel Impedance Value: 682 Ohm
Lead Channel Pacing Threshold Amplitude: 1.125 V
Lead Channel Setting Pacing Amplitude: 2.5 V
Lead Channel Setting Pacing Pulse Width: 0.4 ms
MDC IDC LEAD IMPLANT DT: 20090318
MDC IDC LEAD LOCATION: 753859
MDC IDC MSMT BATTERY REMAINING LONGEVITY: 135 mo
MDC IDC MSMT BATTERY VOLTAGE: 2.8 V
MDC IDC MSMT LEADCHNL RV PACING THRESHOLD PULSEWIDTH: 0.4 ms
MDC IDC PG IMPLANT DT: 20160202
MDC IDC SESS DTM: 20190827183526
MDC IDC SET LEADCHNL RV SENSING SENSITIVITY: 4 mV

## 2018-01-13 DIAGNOSIS — H401131 Primary open-angle glaucoma, bilateral, mild stage: Secondary | ICD-10-CM | POA: Diagnosis not present

## 2018-01-13 DIAGNOSIS — D3132 Benign neoplasm of left choroid: Secondary | ICD-10-CM | POA: Diagnosis not present

## 2018-01-13 DIAGNOSIS — H35371 Puckering of macula, right eye: Secondary | ICD-10-CM | POA: Diagnosis not present

## 2018-01-13 DIAGNOSIS — H353114 Nonexudative age-related macular degeneration, right eye, advanced atrophic with subfoveal involvement: Secondary | ICD-10-CM | POA: Diagnosis not present

## 2018-01-13 DIAGNOSIS — H353122 Nonexudative age-related macular degeneration, left eye, intermediate dry stage: Secondary | ICD-10-CM | POA: Diagnosis not present

## 2018-01-13 DIAGNOSIS — Z961 Presence of intraocular lens: Secondary | ICD-10-CM | POA: Diagnosis not present

## 2018-01-19 DIAGNOSIS — M19011 Primary osteoarthritis, right shoulder: Secondary | ICD-10-CM | POA: Diagnosis not present

## 2018-01-26 ENCOUNTER — Other Ambulatory Visit: Payer: Self-pay | Admitting: Internal Medicine

## 2018-01-26 DIAGNOSIS — E039 Hypothyroidism, unspecified: Secondary | ICD-10-CM

## 2018-01-30 DIAGNOSIS — Z23 Encounter for immunization: Secondary | ICD-10-CM | POA: Diagnosis not present

## 2018-02-15 ENCOUNTER — Other Ambulatory Visit: Payer: Self-pay | Admitting: Internal Medicine

## 2018-02-18 ENCOUNTER — Encounter: Payer: Self-pay | Admitting: Nurse Practitioner

## 2018-02-18 ENCOUNTER — Ambulatory Visit (INDEPENDENT_AMBULATORY_CARE_PROVIDER_SITE_OTHER): Payer: Medicare Other | Admitting: Nurse Practitioner

## 2018-02-18 VITALS — BP 132/88 | HR 84 | Ht 63.0 in | Wt 134.0 lb

## 2018-02-18 DIAGNOSIS — J40 Bronchitis, not specified as acute or chronic: Secondary | ICD-10-CM

## 2018-02-18 DIAGNOSIS — J410 Simple chronic bronchitis: Secondary | ICD-10-CM | POA: Diagnosis not present

## 2018-02-18 DIAGNOSIS — J441 Chronic obstructive pulmonary disease with (acute) exacerbation: Secondary | ICD-10-CM

## 2018-02-18 MED ORDER — LEVALBUTEROL HCL 0.63 MG/3ML IN NEBU
0.6300 mg | INHALATION_SOLUTION | Freq: Once | RESPIRATORY_TRACT | Status: AC
  Administered 2018-02-18: 0.63 mg via RESPIRATORY_TRACT

## 2018-02-18 MED ORDER — DOXYCYCLINE HYCLATE 100 MG PO TABS: 100.0000 mg | ORAL_TABLET | Freq: Two times a day (BID) | ORAL | 0 refills | Status: DC

## 2018-02-18 MED ORDER — METHYLPREDNISOLONE ACETATE 80 MG/ML IJ SUSP
80.0000 mg | Freq: Once | INTRAMUSCULAR | Status: AC
  Administered 2018-02-18: 80 mg via INTRAMUSCULAR

## 2018-02-18 MED ORDER — BENZONATATE 100 MG PO CAPS: 100.0000 mg | ORAL_CAPSULE | Freq: Three times a day (TID) | ORAL | 1 refills | Status: DC

## 2018-02-18 NOTE — Assessment & Plan Note (Signed)
Patient Instructions  DepoMedrol 80 mg given in office today Will order doxycycline Will order tessalon pearls May take delsym twice daily samples given today Follow up in 1 weeks or sooner if needed  General instructions Hydrate  Drink enough water to keep your pee (urine) clear or pale yellow. Rest  Rest as much as possible.  Sleep with your head raised (elevated).  Make sure to get enough sleep each night. General instructions  Put a warm, moist washcloth on your face 3-4 times a day or as told by your doctor. This will help with discomfort.  Wash your hands often with soap and water. If there is no soap and water, use hand sanitizer.  Do not smoke. Avoid being around people who are smoking (secondhand smoke). Call the office if:  You have a fever.  Your symptoms get worse.  Your symptoms do not get better within 10 days.

## 2018-02-18 NOTE — Patient Instructions (Signed)
DepoMedrol 80 mg given in office today Will order doxycycline Will order tessalon pearls May take delsym twice daily samples given today Follow up in 1 weeks or sooner if needed  General instructions Hydrate  Drink enough water to keep your pee (urine) clear or pale yellow. Rest  Rest as much as possible.  Sleep with your head raised (elevated).  Make sure to get enough sleep each night. General instructions  Put a warm, moist washcloth on your face 3-4 times a day or as told by your doctor. This will help with discomfort.  Wash your hands often with soap and water. If there is no soap and water, use hand sanitizer.  Do not smoke. Avoid being around people who are smoking (secondhand smoke). Call the office if:  You have a fever.  Your symptoms get worse.  Your symptoms do not get better within 10 days.

## 2018-02-18 NOTE — Progress Notes (Signed)
@Patient  ID: Jody Blake, female    DOB: [birth year], 84 y.o.   MRN: [id number]  Chief Complaint  Patient presents with  . Cough    Referring provider: Gayland Curry, DO  HPI 84 year old female former smoker with allergic rhinitis, chronic bronchitis followed by Dr. [name].  Tests:  CXR 10/08/17 - Chronic lung changes and emphysema without superimposed acute cardiopulmonary disease. Left chest wall cardiac pacing device is unchanged.  OV 02/18/18 - acute cough Patient presents today for cough. States that symptoms statred 3 days ago and have progressively worsened. She was up most of the night last night with cough. The cough has been productive of clear sputum. She reports sore throat and post nasal drip. She also has sinus pressure and pain. She denies fever, shortness of breath, or edema.      Allergies  Allergen Reactions  . Brimonidine Tartrate-Timolol Other (See Comments)    REACTION: systemic malaise amigen eye drop  . Penicillins Hives  . Breo Ellipta [Fluticasone Furoate-Vilanterol] Other (See Comments)    Ran blood pressure up Increased blood pressure    Immunization History  Administered Date(s) Administered  . Influenza Split 01/07/2011  . Influenza Whole 01/16/2009, 01/23/2010  . Influenza, High Dose Seasonal PF 01/06/2013  . Influenza,inj,Quad PF,6+ Mos 02/17/2015, 01/30/2018  . Influenza,inj,quad, With Preservative 01/06/2017  . Influenza-Unspecified 01/06/2014, 02/01/2016  . Pneumococcal-Unspecified 11/23/2013  . Zoster Recombinat (Shingrix) 03/13/2017, 05/20/2017    Past Medical History:  Diagnosis Date  . Anemia   . Aortic stenosis    a. Echo 09/06/12 EF 55-60%, no WMA, G2DD, Ao valve sclerosis w/ mod stenosis, LA mildly dilated, PA pressure 33mmHg  . Asthmatic bronchitis   . Complete heart block [facility])    s/p permanent pacemaker 06/27/1999 (Battery change 06/2007 and 2016).  s/p AV nodal ablation by Dr [name] 2016.  [name] COPD (chronic obstructive  pulmonary disease) (HCC)    Dr. [name]  . DDD (degenerative disc disease)   . Diastolic dysfunction    a. Echo 09/06/12 EF 55-60%, no WMA, G2DD, Ao valve sclerosis w/ mod stenosis, LA mildly dilated, PA pressure 97mmHg  . Diverticulosis   . DVT (deep vein thrombosis) in pregnancy 1954   a. LLE  . Hypertension   . Hypothyroidism    on medication  . Macular degeneration    Dr. [name]  . Osteoarthrosis, unspecified whether generalized or localized, other specified sites   . PAF (paroxysmal atrial fibrillation) (HCC)    Stopped flecainide, on amiodarone but still has bouts of A FIB (mostly in mornings).   . Pure hypercholesterolemia   . Staghorn calculus    Left    Tobacco History: Social History   Tobacco Use  Smoking Status Never Smoker  Smokeless Tobacco Never Used   Counseling given: Yes   Outpatient Encounter Medications as of 02/18/2018  Medication Sig  . albuterol (PROVENTIL HFA;VENTOLIN HFA) 108 (90 Base) MCG/ACT inhaler Inhale 2 puffs into the lungs every 6 (six) hours as needed for wheezing or shortness of breath.  [name] amLODipine (NORVASC) 5 MG tablet TAKE 1 TABLET BY MOUTH  DAILY  . atorvastatin (LIPITOR) 40 MG tablet TAKE 1 TABLET BY MOUTH ONCE DAILY FOR CHOLESTEROL  . azelastine (ASTELIN) 0.1 % nasal spray USE 1 TO 2 SPRAYS INTO BOTH NOSTRILS TWICE DAILY.  [name] Biotin 5000 MCG CAPS Take 5,000 mg by mouth daily.   . budesonide-formoterol (SYMBICORT) 160-4.5 MCG/ACT inhaler Inhale 2 puffs into the lungs 2 (two) times daily.  [name]  Cholecalciferol (VITAMIN D) 2000 units CAPS Take 1 capsule by mouth daily.  . fluticasone (FLONASE) 50 MCG/ACT nasal spray USE 2 SPRAYS EACH NOSTRIL ONCE A DAY AS NEEDED FOR ALLERGIES OR CONGESTION.  . furosemide (LASIX) 40 MG tablet TAKE 1 TABLET BY MOUTH  DAILY  . gabapentin (NEURONTIN) 100 MG capsule Take 1 capsule (100 mg total) by mouth at bedtime.  [name] latanoprost (XALATAN) 0.005 % ophthalmic solution Place 1 drop into both eyes at bedtime.  [name]  levothyroxine (SYNTHROID, LEVOTHROID) 88 MCG tablet TAKE 1 TABLET BY MOUTH  DAILY BEFORE BREAKFAST  . losartan (COZAAR) 100 MG tablet Take 1 tablet (100 mg total) by mouth daily. At bedtime  . losartan (COZAAR) 50 MG tablet Take 1 tablet (50 mg total) by mouth daily. At noon  . LUTEIN PO Take 25 mg by mouth daily.   . Magnesium 250 MG TABS Take 1 tablet by mouth daily.  . metoprolol succinate (TOPROL-XL) 50 MG 24 hr tablet Take 1 tablet (50 mg total) by mouth every evening.  . montelukast (SINGULAIR) 10 MG tablet TAKE 1 TABLET BY MOUTH  DAILY  . Multiple Vitamins-Minerals (PRESERVISION AREDS 2) CAPS Take 1 capsule by mouth daily.  . potassium chloride SA (K-DUR,KLOR-CON) 20 MEQ tablet Take 20 mEq by mouth daily.   . Rivaroxaban (XARELTO) 15 MG TABS tablet Xarelto 15 mg tablet  . vitamin B-12 (CYANOCOBALAMIN) 1000 MCG tablet Take 1,000 mcg by mouth daily.  . benzonatate (TESSALON) 100 MG capsule Take 1 capsule (100 mg total) by mouth 3 (three) times daily.  [name] doxycycline (VIBRA-TABS) 100 MG tablet Take 1 tablet (100 mg total) by mouth 2 (two) times daily.  . [EXPIRED] levalbuterol (XOPENEX) nebulizer solution 0.63 mg   . [EXPIRED] methylPREDNISolone acetate (DEPO-MEDROL) injection 80 mg    No facility-administered encounter medications on file as of 02/18/2018.      Review of Systems  Review of Systems  Constitutional: Negative.  Negative for fever.  HENT: Positive for congestion, postnasal drip, sinus pressure and sinus pain.   Respiratory: Positive for cough. Negative for shortness of breath and wheezing.   Cardiovascular: Negative.  Negative for chest pain and leg swelling.  Gastrointestinal: Negative.   Allergic/Immunologic: Negative.   Neurological: Negative.   Psychiatric/Behavioral: Negative.        Physical Exam  BP 132/88 (BP Location: Left Arm, Patient Position: Sitting, Cuff Size: Normal)   Pulse 84   Ht 5\' 3"  (1.6 m)   Wt 134 lb (60.8 kg)   LMP 04/08/1974   SpO2  97%   BMI 23.74 kg/m   Wt Readings from Last 5 Encounters:  02/18/18 134 lb (60.8 kg)  11/12/17 134 lb (60.8 kg)  10/15/17 136 lb 9.6 oz (62 kg)  10/08/17 130 lb (59 kg)  09/04/17 133 lb 9.6 oz (60.6 kg)     Physical Exam  Constitutional: She is oriented to person, place, and time. She appears well-developed and well-nourished. No distress.  Cardiovascular: Normal rate and regular rhythm.  Pulmonary/Chest: Effort normal and breath sounds normal. No respiratory distress.  Musculoskeletal: She exhibits no edema.  Neurological: She is alert and oriented to person, place, and time.  Psychiatric: She has a normal mood and affect.  Nursing note and vitals reviewed.     Assessment & Plan:   BRONCHITIS, CHRONIC, ACUTE EXACERBATION Patient Instructions  DepoMedrol 80 mg given in office today Will order doxycycline Will order tessalon pearls May take delsym twice daily samples given today Follow up in 1  weeks or sooner if needed  General instructions Hydrate  Drink enough water to keep your pee (urine) clear or pale yellow. Rest  Rest as much as possible.  Sleep with your head raised (elevated).  Make sure to get enough sleep each night. General instructions  Put a warm, moist washcloth on your face 3-4 times a day or as told by your doctor. This will help with discomfort.  Wash your hands often with soap and water. If there is no soap and water, use hand sanitizer.  Do not smoke. Avoid being around people who are smoking (secondhand smoke). Call the office if:  You have a fever.  Your symptoms get worse.  Your symptoms do not get better within 10 days.        Fenton Foy, NP 02/18/2018

## 2018-02-20 ENCOUNTER — Telehealth: Payer: Self-pay | Admitting: Internal Medicine

## 2018-02-20 NOTE — Telephone Encounter (Signed)
Called and spoke with pt who stated she was still currently taking an abx that was prescribed by Lazaro Arms at her last OV but states she still has a bad cough.  Pt stated she has only taken about two days worth of the abx due to just beginning it 02/18/18. I stated to pt to continue with the recs that was discussed at [facility] and to continue taking the abx as well as her other meds and after the abx or if she became worse compared to last OV to call the office and we would get her scheduled for another OV. Pt expressed understanding. Nothing further needed.

## 2018-02-25 ENCOUNTER — Other Ambulatory Visit: Payer: Self-pay | Admitting: Internal Medicine

## 2018-03-03 ENCOUNTER — Ambulatory Visit (INDEPENDENT_AMBULATORY_CARE_PROVIDER_SITE_OTHER): Payer: Medicare Other

## 2018-03-03 ENCOUNTER — Telehealth: Payer: Self-pay | Admitting: Cardiology

## 2018-03-03 DIAGNOSIS — I442 Atrioventricular block, complete: Secondary | ICD-10-CM | POA: Diagnosis not present

## 2018-03-03 NOTE — Telephone Encounter (Signed)
LMOVM reminding pt to send remote transmission.   

## 2018-03-04 ENCOUNTER — Encounter: Payer: Self-pay | Admitting: Cardiology

## 2018-03-04 NOTE — Progress Notes (Signed)
Remote pacemaker transmission.   

## 2018-03-09 ENCOUNTER — Encounter: Payer: Self-pay | Admitting: Cardiology

## 2018-03-09 ENCOUNTER — Ambulatory Visit (INDEPENDENT_AMBULATORY_CARE_PROVIDER_SITE_OTHER): Payer: Medicare Other | Admitting: Cardiology

## 2018-03-09 VITALS — BP 140/80 | HR 64 | Ht 63.0 in | Wt 130.0 lb

## 2018-03-09 DIAGNOSIS — I442 Atrioventricular block, complete: Secondary | ICD-10-CM

## 2018-03-09 DIAGNOSIS — Z950 Presence of cardiac pacemaker: Secondary | ICD-10-CM

## 2018-03-09 DIAGNOSIS — I5032 Chronic diastolic (congestive) heart failure: Secondary | ICD-10-CM | POA: Diagnosis not present

## 2018-03-09 DIAGNOSIS — I4821 Permanent atrial fibrillation: Secondary | ICD-10-CM

## 2018-03-09 DIAGNOSIS — Z79899 Other long term (current) drug therapy: Secondary | ICD-10-CM | POA: Diagnosis not present

## 2018-03-09 DIAGNOSIS — I639 Cerebral infarction, unspecified: Secondary | ICD-10-CM | POA: Diagnosis not present

## 2018-03-09 NOTE — Patient Instructions (Signed)
Medication Instructions:  You may try taking Pepcid 20 mg daily. Continue all other medications as listed.  If you need a refill on your cardiac medications before your next appointment, please call your pharmacy.   Lab work: Please have blood work today (CBC, BMP) If you have labs (blood work) drawn today and your tests are completely normal, you will receive your results only by: [name] MyChart Message (if you have MyChart) OR . A paper copy in the mail If you have any lab test that is abnormal or we need to change your treatment, we will call you to review the results.  Follow-Up: At [facility], you and your health needs are our priority.  As part of our continuing mission to provide you with exceptional heart care, we have created designated Provider Care Teams.  These Care Teams include your primary Cardiologist (physician) and Advanced Practice Providers (APPs -  Physician Assistants and Nurse Practitioners) who all work together to provide you with the care you need, when you need it. You will need a follow up appointment in 12 months.  Please call our office 2 months in advance to schedule this appointment.  You may see Candee Furbish, MD or one of the following Advanced Practice Providers on your designated Care Team:   Truitt Merle, NP Cecilie Kicks, NP . Kathyrn Drown, NP  Thank you for choosing [facility]!!

## 2018-03-09 NOTE — Progress Notes (Signed)
Cardiology Office Note:    Date:  03/09/2018   ID:  Jody Blake, DOB Jul 29, 1925, MRN [id number]  PCP:  Jody Curry, DO  Cardiologist:  Candee Furbish, MD  Electrophysiologist:  None   Referring MD: Jody Curry, DO     History of Present Illness:    Jody Blake is a 84 y.o. female here for follow-up of pacemaker, complete heart block. - Had AV nodal ablation secondary to atrial fibrillation.  Could not control with antiarrhythmics.  I take care of her son as well.  Overall she has been battling a persistent cough.  Bothering her extensively at night.  Her prior echocardiogram just performed in March showed normal ejection fraction.  Mild aortic stenosis.  Denies any fevers chills weight loss.     Past Medical History:  Diagnosis Date  . Anemia   . Aortic stenosis    a. Echo 09/06/12 EF 55-60%, no WMA, G2DD, Ao valve sclerosis w/ mod stenosis, LA mildly dilated, PA pressure 21mmHg  . Asthmatic bronchitis   . Complete heart block [facility])    s/p permanent pacemaker 06/27/1999 (Battery change 06/2007 and 2016).  s/p AV nodal ablation by Dr Jody Blake 2016.  Jody Blake COPD (chronic obstructive pulmonary disease) (HCC)    Dr. [name]  . DDD (degenerative disc disease)   . Diastolic dysfunction    a. Echo 09/06/12 EF 55-60%, no WMA, G2DD, Ao valve sclerosis w/ mod stenosis, LA mildly dilated, PA pressure 79mmHg  . Diverticulosis   . DVT (deep vein thrombosis) in pregnancy 1954   a. LLE  . Hypertension   . Hypothyroidism    on medication  . Macular degeneration    Dr. Eliezer Blake  . Osteoarthrosis, unspecified whether generalized or localized, other specified sites   . PAF (paroxysmal atrial fibrillation) (HCC)    Stopped flecainide, on amiodarone but still has bouts of A FIB (mostly in mornings).   . Pure hypercholesterolemia   . Staghorn calculus    Left    Past Surgical History:  Procedure Laterality Date  . APPENDECTOMY  ~ 1941  . AV NODE ABLATION  05/10/2014  . AV NODE ABLATION N/A  05/10/2014   Procedure: AV NODE ABLATION;  Surgeon: Jody Grayer, MD;  Location: [facility] C[facility] LAB;  Service: Cardiovascular;  Laterality: N/A;  . BUNIONECTOMY WITH HAMMERTOE RECONSTRUCTION Bilateral ~ 1990  . CARDIAC PACEMAKER PLACEMENT  06/27/99   Medtronic PM implanted by Dr Jody Blake  . CARDIOVERSION N/A 12/02/2013   Procedure: CARDIOVERSION;  Surgeon: Jody Records, MD;  Location: [facility] ENDOSCOPY;  Service: Cardiovascular;  Laterality: N/A;  . CATARACT EXTRACTION W/ INTRAOCULAR LENS  IMPLANT, BILATERAL Bilateral   . COLONOSCOPY WITH PROPOFOL N/A 04/22/2013   Procedure: COLONOSCOPY WITH PROPOFOL;  Surgeon: Jody Fair, MD;  Location: [facility] ENDOSCOPY;  Service: Endoscopy;  Laterality: N/A;  . DILATION AND CURETTAGE OF UTERUS  X 2   "when I was going thru menopause"  . ESOPHAGOGASTRODUODENOSCOPY (EGD) WITH PROPOFOL N/A 04/22/2013   Procedure: ESOPHAGOGASTRODUODENOSCOPY (EGD) WITH PROPOFOL;  Surgeon: Jody Fair, MD;  Location: [facility] ENDOSCOPY;  Service: Endoscopy;  Laterality: N/A;  . HERNIA REPAIR    . INCISIONAL HERNIA REPAIR    . INSERT / REPLACE / REMOVE PACEMAKER  06/2007   "took out the old; put in new"  . INSERT / REPLACE / REMOVE PACEMAKER  05/10/2014   MDT PPM generator change by Dr Jody Blake  . JOINT REPLACEMENT    . PARTIAL NEPHRECTOMY Left 05/1974   stone  disease  . PERMANENT PACEMAKER GENERATOR CHANGE N/A 05/10/2014   Procedure: PERMANENT PACEMAKER GENERATOR CHANGE;  Surgeon: Jody Grayer, MD;  Location: [facility] CATH LAB;  Service: Cardiovascular;  Laterality: N/A;  . TONSILLECTOMY AND ADENOIDECTOMY  1930's  . TOTAL KNEE ARTHROPLASTY Right 2001    Current Medications: Current Meds  Medication Sig  . amLODipine (NORVASC) 5 MG tablet TAKE 1 TABLET BY MOUTH  DAILY  . atorvastatin (LIPITOR) 40 MG tablet TAKE 1 TABLET BY MOUTH ONCE DAILY FOR CHOLESTEROL  . azelastine (ASTELIN) 0.1 % nasal spray USE 1 TO 2 SPRAYS INTO BOTH NOSTRILS TWICE DAILY.  . benzonatate (TESSALON) 100 MG capsule Take 1 capsule  (100 mg total) by mouth 3 (three) times daily.  . Biotin 5000 MCG CAPS Take 5,000 mg by mouth daily.   . budesonide-formoterol (SYMBICORT) 160-4.5 MCG/ACT inhaler Inhale 2 puffs into the lungs 2 (two) times daily.  . Cholecalciferol (VITAMIN D) 2000 units CAPS Take 1 capsule by mouth daily.  Jody Blake doxycycline (VIBRA-TABS) 100 MG tablet Take 1 tablet (100 mg total) by mouth 2 (two) times daily.  . fluticasone (FLONASE) 50 MCG/ACT nasal spray USE 2 SPRAYS EACH NOSTRIL ONCE A DAY AS NEEDED FOR ALLERGIES OR CONGESTION.  . furosemide (LASIX) 40 MG tablet TAKE 1 TABLET BY MOUTH  DAILY  . gabapentin (NEURONTIN) 100 MG capsule Take 1 capsule (100 mg total) by mouth at bedtime.  Jody Blake latanoprost (XALATAN) 0.005 % ophthalmic solution Place 1 drop into both eyes at bedtime.  Jody Blake levothyroxine (SYNTHROID, LEVOTHROID) 88 MCG tablet TAKE 1 TABLET BY MOUTH  DAILY BEFORE BREAKFAST  . losartan (COZAAR) 100 MG tablet Take 1 tablet (100 mg total) by mouth daily. At bedtime  . losartan (COZAAR) 50 MG tablet Take 1 tablet (50 mg total) by mouth daily. At noon  . LUTEIN PO Take 25 mg by mouth daily.   . Magnesium 250 MG TABS Take 1 tablet by mouth daily.  . metoprolol succinate (TOPROL-XL) 50 MG 24 hr tablet Take 1 tablet (50 mg total) by mouth every evening.  . montelukast (SINGULAIR) 10 MG tablet TAKE 1 TABLET BY MOUTH  DAILY  . Multiple Vitamins-Minerals (PRESERVISION AREDS 2) CAPS Take 1 capsule by mouth daily.  . potassium chloride SA (K-DUR,KLOR-CON) 20 MEQ tablet Take 20 mEq by mouth daily.   Jody Blake PROAIR HFA 108 (90 Base) MCG/ACT inhaler USE 2 PUFFS EVERY 6 HOURS AS NEEDED FOR SHORTNESS OF BREATH AND WHEEZING.  . Rivaroxaban (XARELTO) 15 MG TABS tablet Xarelto 15 mg tablet  . vitamin B-12 (CYANOCOBALAMIN) 1000 MCG tablet Take 1,000 mcg by mouth daily.     Allergies:   Brimonidine tartrate-timolol; Penicillins; and Breo ellipta [fluticasone furoate-vilanterol]   Social History   Socioeconomic History  . Marital  status: Married    Spouse name: Not on file  . Number of children: 2  . Years of education: Not on file  . Highest education level: Not on file  Occupational History  . Occupation: RETIRED    Employer: RETIRED    Comment: [employer]  . Financial resource strain: Not hard at all  . Food insecurity:    Worry: Never true    Inability: Never true  . Transportation needs:    Medical: No    Non-medical: No  Tobacco Use  . Smoking status: Never Smoker  . Smokeless tobacco: Never Used  Substance and Sexual Activity  . Alcohol use: Yes    Alcohol/week: 7.0 standard drinks  Types: 7 Glasses of wine per week    Comment: occasionally  . Drug use: No  . Sexual activity: Not Currently    Partners: Male  Lifestyle  . Physical activity:    Days per week: 7 days    Minutes per session: 30 min  . Stress: Not at all  Relationships  . Social connections:    Talks on phone: More than three times a week    Gets together: More than three times a week    Attends religious service: More than 4 times per year    Active member of club or organization: Yes    Attends meetings of clubs or organizations: More than 4 times per year    Relationship status: Married  Other Topics Concern  . Not on file  Social History Narrative   Diet? Low salt, low fat      Do you drink/eat things with caffeine? yes      Marital status?                 married                   What year were you married? 1949      Do you live in a house, apartment, assisted living, condo, trailer, etc.? apartment      Is it one or more stories? 3 stories      How many persons live in your home? Just me      Do you have any pets in your home? (please list) no      Current or past profession: accounting      Do you exercise?          yes                            Type & how often? Walk, class, prescribed daily      Do you have a living will? yes      Do you have a DNR form?     yes                              If not, do you want to discuss one?      Do you have signed POA/HPOA for forms? no           Family History: The patient's family history includes Breast cancer in her mother; Heart attack in her brother; Hypertension in her father; Malignant hypertension in her father; Renal Disease in her father; Stroke in her brother.  ROS:   Please see the history of present illness.    Positive for cough all other systems reviewed and are negative.  EKGs/Labs/Other Studies Reviewed:    The following studies were reviewed today: Prior office notes, echocardiogram EKG reviewed  EKG:  EKG is  ordered today.  The ekg ordered today demonstrates March 09, 2018 ventricular paced rhythm 64 atrial fibrillation underlying personally reviewed  Recent Labs: 11/04/2017: ALT 24; BUN 19; Creatinine 0.8; Hemoglobin 12.9; Platelets 250; Potassium 3.8; Sodium 141  Recent Lipid Panel    Component Value Date/Time   CHOL 138 11/04/2017 0600   TRIG 53 11/04/2017 0600   HDL 70 11/04/2017 0600   CHOLHDL 3.1 06/25/2017 0229   VLDL 11 06/25/2017 0229   LDLCALC 58 11/04/2017 0600    Physical Exam:    VS:  BP 140/80  Pulse 64   Ht 5\' 3"  (1.6 m)   Wt 130 lb (59 kg)   LMP 04/08/1974   BMI 23.03 kg/m     Wt Readings from Last 3 Encounters:  03/09/18 130 lb (59 kg)  02/18/18 134 lb (60.8 kg)  11/12/17 134 lb (60.8 kg)     GEN:  Well nourished, well developed in no acute distress HEENT: Normal NECK: No JVD; No carotid bruits LYMPHATICS: No lymphadenopathy CARDIAC: RRR, no murmurs, rubs, gallops RESPIRATORY: Mild wheeze bilaterally ABDOMEN: Soft, non-tender, non-distended MUSCULOSKELETAL:  No edema; No deformity  SKIN: Warm and dry NEUROLOGIC:  Alert and oriented x 3 PSYCHIATRIC:  Normal affect   ASSESSMENT:    1. Permanent atrial fibrillation   2. Complete heart block ([place])   3. Chronic diastolic heart failure (HCC)   4. Cardiac pacemaker in situ   5. Encounter for long-term  current use of medication    PLAN:    In order of problems listed above:  Permanent atrial fibrillation with pacemaker placement after complete heart block following AV nodal ablation - Doing well.  64 bpm on ECG.  No changes. -Dr. Rayann Blake has been monitoring  Chronic anticoagulation - Xarelto reduced dose 15 mg.  No bleeding.  We will check a CBC and a basic metabolic profile  Essential hypertension -Mildly elevated today.  Continue to monitor.  History of TIA x2.  Chronic bronchitis/emphysema - Her cough still is plaguing her.  Perhaps empiric Pepcid may help.  I did ask her to call back Dr. Janee Morn team to see if there is any other suggestions.  Chest x-ray personally reviewed.  Medication Adjustments/Labs and Tests Ordered: Current medicines are reviewed at length with the patient today.  Concerns regarding medicines are outlined above.  Orders Placed This Encounter  Procedures  . CBC  . Basic metabolic panel  . EKG 12-Lead   No orders of the defined types were placed in this encounter.   Patient Instructions  Medication Instructions:  You may try taking Pepcid 20 mg daily. Continue all other medications as listed.  If you need a refill on your cardiac medications before your next appointment, please call your pharmacy.   Lab work: Please have blood work today (CBC, BMP) If you have labs (blood work) drawn today and your tests are completely normal, you will receive your results only by: Jody Blake MyChart Message (if you have MyChart) OR . A paper copy in the mail If you have any lab test that is abnormal or we need to change your treatment, we will call you to review the results.  Follow-Up: At [facility], you and your health needs are our priority.  As part of our continuing mission to provide you with exceptional heart care, we have created designated Provider Care Teams.  These Care Teams include your primary Cardiologist (physician) and Advanced Practice Providers  (APPs -  Physician Assistants and Nurse Practitioners) who all work together to provide you with the care you need, when you need it. You will need a follow up appointment in 12 months.  Please call our office 2 months in advance to schedule this appointment.  You may see Candee Furbish, MD or one of the following Advanced Practice Providers on your designated Care Team:   Truitt Merle, NP Cecilie Kicks, NP . Kathyrn Drown, NP  Thank you for choosing [facility]!!         Signed, Candee Furbish, MD  03/09/2018 4:15 PM    [facility]

## 2018-03-10 LAB — CBC
HEMATOCRIT: 37 % (ref 34.0–46.6)
Hemoglobin: 12.9 g/dL (ref 11.1–15.9)
MCH: 32.8 pg (ref 26.6–33.0)
MCHC: 34.9 g/dL (ref 31.5–35.7)
MCV: 94 fL (ref 79–97)
Platelets: 276 10*3/uL (ref 150–450)
RBC: 3.93 x10E6/uL (ref 3.77–5.28)
RDW: 12.8 % (ref 12.3–15.4)
WBC: 9 10*3/uL (ref 3.4–10.8)

## 2018-03-10 LAB — BASIC METABOLIC PANEL
BUN / CREAT RATIO: 26 (ref 12–28)
BUN: 27 mg/dL (ref 10–36)
CALCIUM: 9.5 mg/dL (ref 8.7–10.3)
CO2: 23 mmol/L (ref 20–29)
Chloride: 100 mmol/L (ref 96–106)
Creatinine, Ser: 1.04 mg/dL — ABNORMAL HIGH (ref 0.57–1.00)
GFR, EST AFRICAN AMERICAN: 55 mL/min/{1.73_m2} — AB (ref >59)
GFR, EST NON AFRICAN AMERICAN: 47 mL/min/{1.73_m2} — AB (ref >59)
Glucose: 92 mg/dL (ref 65–99)
Potassium: 4 mmol/L (ref 3.5–5.2)
Sodium: 142 mmol/L (ref 134–144)

## 2018-03-11 ENCOUNTER — Telehealth: Payer: Self-pay

## 2018-03-11 NOTE — Telephone Encounter (Signed)
Pt advised CBC results

## 2018-03-11 NOTE — Telephone Encounter (Signed)
-----   Message from Jerline Pain, MD sent at 03/10/2018  5:26 PM EST ----- Stable lab work with [place].  No changes made. Candee Furbish, MD

## 2018-03-11 NOTE — Telephone Encounter (Signed)
Patient returning call.

## 2018-03-11 NOTE — Telephone Encounter (Signed)
LMTCB

## 2018-03-12 ENCOUNTER — Telehealth: Payer: Self-pay | Admitting: Internal Medicine

## 2018-03-12 MED ORDER — BENZONATATE 200 MG PO CAPS: 200.0000 mg | ORAL_CAPSULE | Freq: Three times a day (TID) | ORAL | 1 refills | Status: DC | PRN

## 2018-03-12 MED ORDER — AZITHROMYCIN 250 MG PO TABS: ORAL_TABLET | ORAL | 0 refills | Status: DC

## 2018-03-12 NOTE — Telephone Encounter (Signed)
Pt c/o prod cough with clear mucus X3 weeks. Denies sinus congestion, fever, chills, chest pain.    Was prescribed an abx and tessalon perles at 11/13 OV with Tonya for same symptoms.  Cough is improved but still present  Also taking Delsym to help with cough, also taking bourbon and honey.  Requesting additional recs.   Pharmacy: [facility].    CY please advise on recs.  Thanks!

## 2018-03-12 NOTE — Telephone Encounter (Signed)
Spoke with pt, aware of recs.  rx's sent to preferred pharmacy.  Nothing further needed.  

## 2018-03-12 NOTE — Telephone Encounter (Signed)
Offer Zpak 250 mg, # 6, 2 today then one daily           Benzonatate perles 200 mg, # 30,  1 every 8 hours if needed for cough

## 2018-03-13 ENCOUNTER — Telehealth: Payer: Self-pay | Admitting: Internal Medicine

## 2018-03-13 NOTE — Telephone Encounter (Signed)
BRONCHITIS, CHRONIC, ACUTE EXACERBATION Patient Instructions  DepoMedrol 80 mg given in office today Will order doxycycline Will order tessalon pearls May take delsym twice daily samples given today Follow up in 1 weeks or sooner if needed  Pt's next appt is not until July 2020 with CY so if she is not any better, she needs to be seen for an OV.  Called and spoke with pt letting her know that we did only send in 2 Rx's to her pharmacy and she was also given delsym at [place]. Pt expressed understanding and also stated to me she was up all night last night coughing. I stated to her if she was no better from last OV that we needed to get her in for an appt. Pt stated to me that if she was no better over the weekend that she would call us Monday to be scheduled for an OV. Nothing further needed.

## 2018-03-16 ENCOUNTER — Other Ambulatory Visit: Payer: Self-pay | Admitting: Cardiology

## 2018-03-16 ENCOUNTER — Other Ambulatory Visit: Payer: Self-pay | Admitting: Internal Medicine

## 2018-03-18 ENCOUNTER — Non-Acute Institutional Stay: Payer: Medicare Other | Admitting: Internal Medicine

## 2018-03-18 ENCOUNTER — Encounter: Payer: Self-pay | Admitting: Internal Medicine

## 2018-03-18 VITALS — BP 130/80 | HR 83 | Temp 98.6°F | Ht 63.0 in | Wt 132.0 lb

## 2018-03-18 DIAGNOSIS — I825Z3 Chronic embolism and thrombosis of unspecified deep veins of distal lower extremity, bilateral: Secondary | ICD-10-CM | POA: Diagnosis not present

## 2018-03-18 DIAGNOSIS — Z780 Asymptomatic menopausal state: Secondary | ICD-10-CM

## 2018-03-18 DIAGNOSIS — J209 Acute bronchitis, unspecified: Secondary | ICD-10-CM

## 2018-03-18 DIAGNOSIS — Z23 Encounter for immunization: Secondary | ICD-10-CM

## 2018-03-18 DIAGNOSIS — I4891 Unspecified atrial fibrillation: Secondary | ICD-10-CM

## 2018-03-18 DIAGNOSIS — I639 Cerebral infarction, unspecified: Secondary | ICD-10-CM

## 2018-03-18 DIAGNOSIS — D6869 Other thrombophilia: Secondary | ICD-10-CM | POA: Diagnosis not present

## 2018-03-18 DIAGNOSIS — E2839 Other primary ovarian failure: Secondary | ICD-10-CM | POA: Diagnosis not present

## 2018-03-18 DIAGNOSIS — I82409 Acute embolism and thrombosis of unspecified deep veins of unspecified lower extremity: Secondary | ICD-10-CM | POA: Insufficient documentation

## 2018-03-18 MED ORDER — TETANUS-DIPHTH-ACELL PERTUSSIS 5-2-15.5 LF-MCG/0.5 IM SUSP: 0.5000 mL | Freq: Once | INTRAMUSCULAR | 0 refills | Status: AC

## 2018-03-18 NOTE — Progress Notes (Signed)
Location:  Occupational psychologist of Service:  Clinic (12)  Provider: Kamirah Shugrue L. Mariea Clonts, D.O., C.M.D.  Code Status: DNR Goals of Care:  Advanced Directives 11/12/2017  Does Patient Have a Medical Advance Directive? Yes  Type of Paramedic of [place];Out of facility DNR (pink MOST or yellow form)  Does patient want to make changes to medical advance directive? No - Patient declined  Copy of [place] in Chart? Yes  Would patient like information on creating a medical advance directive? -  Pre-existing out of facility DNR order (yellow form or pink MOST form) Yellow form placed in chart (order not valid for inpatient use)     Chief Complaint  Patient presents with  . Medical Management of Chronic Issues    57mth follow-up    HPI: Patient is a 84 y.o. female seen today for medical management of chronic diseases.    She has had a bad bronchitis, but she is much better now.  She's managed to sleep the past two nights.  She reported having a coughing fit before I entered but did not cough at all in my presence or even during the lung exam.  She was seen by Dr. [name] for this and was given depomedrol 80mg  12/6, doxycycline, tessalon perles, delsym bid samples and to f/u in1 week if no better.    She also saw Dr. Marlou Porch and had f/u labs done with him due to her xarelto and her h/h was stable.  Her creatinine was up, but she was starting to get sick at the time. Says she is now hydrating well.  We discussed that it's been years since she had a bone density and she's higher risk for osteoporosis and fracture considering steroid her inhaler use and copd exacerbations with prednisone needed sometimes.  She is also unsteady walking ever since a prior stroke.  Chart only shows TIAs but she has had residual balance problems meaning chronic effect and actual cva. She agrees to get a bone density study done.  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  Past Medical History:  Diagnosis Date  . Anemia   . Aortic stenosis    a. Echo 09/06/12 EF 55-60%, no WMA, G2DD, Ao valve sclerosis w/ mod stenosis, LA mildly dilated, PA pressure 45mmHg  . Asthmatic bronchitis   . Complete heart block [facility])    s/p permanent pacemaker 06/27/1999 (Battery change 06/2007 and 2016).  s/p AV nodal ablation by Dr [name] 2016.  [name] COPD (chronic obstructive pulmonary disease) (HCC)    Dr. [name]  . DDD (degenerative disc disease)   . Diastolic dysfunction    a. Echo 09/06/12 EF 55-60%, no WMA, G2DD, Ao valve sclerosis w/ mod stenosis, LA mildly dilated, PA pressure 103mmHg  . Diverticulosis   . DVT (deep vein thrombosis) in pregnancy 1954   a. LLE  . Hypertension   . Hypothyroidism    on medication  . Macular degeneration    Dr. [name]  . Osteoarthrosis, unspecified whether generalized or localized, other specified sites   . PAF (paroxysmal atrial fibrillation) (HCC)    Stopped flecainide, on amiodarone but still has bouts of A FIB (mostly in mornings).   . Pure hypercholesterolemia   . Staghorn calculus    Left    Past Surgical History:  Procedure Laterality Date  . APPENDECTOMY  ~ 1941  . AV NODE ABLATION  05/10/2014  .  AV NODE ABLATION N/A 05/10/2014   Procedure: AV NODE ABLATION;  Surgeon: Thompson Grayer, MD;  Location: [facility] CATH LAB;  Service: Cardiovascular;  Laterality: N/A;  . BUNIONECTOMY WITH HAMMERTOE RECONSTRUCTION Bilateral ~ 1990  . CARDIAC PACEMAKER PLACEMENT  06/27/99   Medtronic PM implanted by Dr [name]  . CARDIOVERSION N/A 12/02/2013   Procedure: CARDIOVERSION;  Surgeon: [name], MD;  Location: [facility] ENDOSCOPY;  Service: Cardiovascular;  Laterality: N/A;  . CATARACT EXTRACTION W/ INTRAOCULAR LENS  IMPLANT, BILATERAL Bilateral   . COLONOSCOPY WITH PROPOFOL N/A 04/22/2013   Procedure: COLONOSCOPY WITH PROPOFOL;  Surgeon: [name], MD;  Location: [facility] ENDOSCOPY;  Service: Endoscopy;  Laterality:  N/A;  . DILATION AND CURETTAGE OF UTERUS  X 2   "when I was going thru menopause"  . ESOPHAGOGASTRODUODENOSCOPY (EGD) WITH PROPOFOL N/A 04/22/2013   Procedure: ESOPHAGOGASTRODUODENOSCOPY (EGD) WITH PROPOFOL;  Surgeon: [name], MD;  Location: [facility] ENDOSCOPY;  Service: Endoscopy;  Laterality: N/A;  . HERNIA REPAIR    . INCISIONAL HERNIA REPAIR    . INSERT / REPLACE / REMOVE PACEMAKER  06/2007   "took out the old; put in new"  . INSERT / REPLACE / REMOVE PACEMAKER  05/10/2014   MDT PPM generator change by Dr [name]  . JOINT REPLACEMENT    . PARTIAL NEPHRECTOMY Left 05/1974   stone disease  . PERMANENT PACEMAKER GENERATOR CHANGE N/A 05/10/2014   Procedure: PERMANENT PACEMAKER GENERATOR CHANGE;  Surgeon: Thompson Grayer, MD;  [facility] LAB;  Service: Cardiovascular;  Laterality: N/A;  . TONSILLECTOMY AND ADENOIDECTOMY  1930's  . TOTAL KNEE ARTHROPLASTY Right 2001    Allergies  Allergen Reactions  . Brimonidine Tartrate-Timolol Other (See Comments)    REACTION: systemic malaise amigen eye drop  . Penicillins Hives  . Breo Ellipta [Fluticasone Furoate-Vilanterol] Other (See Comments)    Ran blood pressure up Increased blood pressure    Outpatient Encounter Medications as of 03/18/2018  Medication Sig  . amLODipine (NORVASC) 5 MG tablet TAKE 1 TABLET BY MOUTH  DAILY  . atorvastatin (LIPITOR) 40 MG tablet TAKE 1 TABLET BY MOUTH ONCE DAILY FOR CHOLESTEROL  . azelastine (ASTELIN) 0.1 % nasal spray USE 1 TO 2 SPRAYS INTO BOTH NOSTRILS TWICE DAILY.  [name] Biotin 5000 MCG CAPS Take 5,000 mg by mouth daily.   . Cholecalciferol (VITAMIN D) 2000 units CAPS Take 1 capsule by mouth daily.  . fluticasone (FLONASE) 50 MCG/ACT nasal spray USE 2 SPRAYS EACH NOSTRIL ONCE A DAY AS NEEDED FOR ALLERGIES OR CONGESTION.  . furosemide (LASIX) 40 MG tablet TAKE 1 TABLET BY MOUTH  DAILY  . gabapentin (NEURONTIN) 100 MG capsule Take 1 capsule (100 mg total) by mouth at bedtime.  [name] latanoprost (XALATAN) 0.005  % ophthalmic solution Place 1 drop into both eyes at bedtime.  [name] levothyroxine (SYNTHROID, LEVOTHROID) 88 MCG tablet TAKE 1 TABLET BY MOUTH  DAILY BEFORE BREAKFAST  . losartan (COZAAR) 100 MG tablet Take 1 tablet (100 mg total) by mouth daily. At bedtime  . losartan (COZAAR) 50 MG tablet Take 1 tablet (50 mg total) by mouth daily. At noon  . LUTEIN PO Take 25 mg by mouth daily.   . Magnesium 250 MG TABS Take 1 tablet by mouth daily.  . metoprolol succinate (TOPROL-XL) 50 MG 24 hr tablet Take 1 tablet (50 mg total) by mouth every evening.  . montelukast (SINGULAIR) 10 MG tablet TAKE 1 TABLET BY MOUTH  DAILY  . Multiple Vitamins-Minerals (PRESERVISION AREDS 2) CAPS  Take 1 capsule by mouth daily.  . potassium chloride SA (K-DUR,KLOR-CON) 20 MEQ tablet Take 20 mEq by mouth daily.   [name] PROAIR HFA 108 (90 Base) MCG/ACT inhaler USE 2 PUFFS EVERY 6 HOURS AS NEEDED FOR SHORTNESS OF BREATH AND WHEEZING.  . SYMBICORT 160-4.5 MCG/ACT inhaler INHALE 2 PUFFS BY MOUTH  INTO THE LUNGS TWO TIMES  DAILY  . vitamin B-12 (CYANOCOBALAMIN) 1000 MCG tablet Take 1,000 mcg by mouth daily.  Alveda Reasons 15 MG TABS tablet TAKE 1 TABLET BY MOUTH  DAILY WITH SUPPER  . [DISCONTINUED] azithromycin (ZITHROMAX) 250 MG tablet Take 2 tablets today, then 1 daily until gone.  . [DISCONTINUED] benzonatate (TESSALON) 200 MG capsule Take 1 capsule (200 mg total) by mouth 3 (three) times daily as needed for cough.  . [DISCONTINUED] doxycycline (VIBRA-TABS) 100 MG tablet Take 1 tablet (100 mg total) by mouth 2 (two) times daily.   No facility-administered encounter medications on file as of 03/18/2018.     Review of Systems:  Review of Systems  Constitutional: Negative for chills, fever and malaise/fatigue.  HENT: Positive for hearing loss. Negative for congestion.   Eyes: Positive for blurred vision.  Respiratory: Positive for cough. Negative for sputum production, shortness of breath and wheezing.        Much improved    Cardiovascular: Negative for chest pain, palpitations and leg swelling.  Gastrointestinal: Negative for abdominal pain, blood in stool, constipation, diarrhea and melena.  Genitourinary: Negative for dysuria.  Musculoskeletal: Negative for falls and joint pain.  Neurological: Negative for dizziness.       Unsteady gait  Psychiatric/Behavioral: Positive for memory loss. Negative for depression. The patient is not nervous/anxious and does not have insomnia.     Health Maintenance  Topic Date Due  . TETANUS/TDAP  02/15/1945  . DEXA SCAN  02/14/1993  . PNA vac Low Risk Adult (2 of 2 - PCV13) 11/24/2014  . INFLUENZA VACCINE  Completed    Physical Exam: Vitals:   03/18/18 1008  BP: 130/80  Pulse: 83  Temp: 98.6 F (37 C)  TempSrc: Oral  SpO2: 98%  Weight: 132 lb (59.9 kg)  Height: 5\' 3"  (1.6 m)   Body mass index is 23.38 kg/m. Physical Exam  Constitutional: She is oriented to person, place, and time. She appears well-developed. No distress.  HENT:  Head: Normocephalic and atraumatic.  Mouth/Throat: No oropharyngeal exudate.  Some residual nasal congestion, blowing nose  Eyes:  glasses  Cardiovascular: Intact distal pulses.  irreg irreg  Pulmonary/Chest: Effort normal and breath sounds normal. No respiratory distress. She has no wheezes.  Abdominal: Bowel sounds are normal.  Musculoskeletal: Normal range of motion.  Unsteady gait--teeters to the side  Neurological: She is alert and oriented to person, place, and time.  Skin: Skin is warm and dry.  Psychiatric: She has a normal mood and affect.    Labs reviewed: Basic Metabolic Panel: Recent Labs    06/24/17 2102 06/24/17 2144 06/25/17 1044 11/04/17 0600 03/09/18 1553  NA 137 140 140 141 142  K 3.7 3.5 3.6 3.8 4.0  CL 103 103 105  --  100  CO2 24  --  27  --  23  GLUCOSE 129* 129* 103*  --  92  BUN 34* 36* 21* 19 27  CREATININE 1.22* 1.20* 0.79 0.8 1.04*  CALCIUM 9.5  --  9.0  --  9.5   Liver Function  Tests: Recent Labs    06/24/17 2102 11/04/17 0600  AST 34  32  ALT 17 24  ALKPHOS 67 71  BILITOT 0.4  --   PROT 6.5  --   ALBUMIN 3.8  --    No results for input(s): LIPASE, AMYLASE in the last 8760 hours. No results for input(s): AMMONIA in the last 8760 hours. CBC: Recent Labs    06/24/17 2102 06/24/17 2144 11/04/17 0600 03/09/18 1553  WBC 6.5  --  6.2 9.0  NEUTROABS 4.1  --   --   --   HGB 12.9 13.6 12.9 12.9  HCT 40.2 40.0 40 37.0  MCV 99.0  --   --  94  PLT 264  --  250 276   Lipid Panel: Recent Labs    06/25/17 0229 11/04/17 0600  CHOL 198 138  HDL 63 70  LDLCALC 124* 58  TRIG 57 53  CHOLHDL 3.1  --    Lab Results  Component Value Date   HGBA1C 5.7 (H) 06/25/2017    Assessment/Plan 1. Postmenopausal - DG Bone Density; Future 2. Estrogen deficiency - DG Bone Density; Future -likely has osteoporosis from her steroid use and thyroid disease plus postmenopausal estrogen deficiency  3. Acute bronchitis, unspecified organism -much better now, completing course of doxy from pulmonary, coughing less and sleeping through the night  4. Hypercoagulable state due to atrial fibrillation ([place]) -continues on xarelto and her cbc has been stable and no bleeding side effects  5. Need for Tdap vaccination - Tdap (ADACEL) 08-07-13.5 LF-MCG/0.5 injection; Inject 0.5 mLs into the muscle once for 1 dose.  Dispense: 0.5 mL; Refill: 0 Sent Rx to pharmacy for her to get  6. Chronic embolism and thrombosis of deep vein of lower extremity, unspecified laterality (HCC) -historical, no imaging in chart to clarify which side, must have been prior to establishing here   Needs another pneumonia vaccine, but it's unclear which one she actually had before.  Was in 2015 so most likely was pneumovax.  Will need prevnar next visit.  Labs/tests ordered: bone density, tdap Next appt:  4 mos med mgt  Idus Rathke L. Bracken Moffa, D.O. [facility] [address], [place] [address] Cell Phone (Mon-Fri 8am-5pm):  [phone] On Call:  [phone] & follow prompts after 5pm & weekends Office Phone:  [phone] Office Fax:  [phone]

## 2018-04-16 DIAGNOSIS — B351 Tinea unguium: Secondary | ICD-10-CM | POA: Diagnosis not present

## 2018-04-16 DIAGNOSIS — Z85828 Personal history of other malignant neoplasm of skin: Secondary | ICD-10-CM | POA: Diagnosis not present

## 2018-04-17 ENCOUNTER — Ambulatory Visit: Payer: Medicare Other | Admitting: Adult Health

## 2018-04-24 LAB — CUP PACEART REMOTE DEVICE CHECK
Battery Impedance: 182 Ohm
Battery Remaining Longevity: 131 mo
Battery Voltage: 2.8 V
Brady Statistic RV Percent Paced: 99 %
Date Time Interrogation Session: 20191127005121
Implantable Lead Implant Date: 20090318
Implantable Lead Location: 753859
Implantable Lead Location: 753860
Implantable Lead Model: 5076
Implantable Lead Model: 5092
Implantable Pulse Generator Implant Date: 20160202
Lead Channel Impedance Value: 67 Ohm
Lead Channel Impedance Value: 714 Ohm
Lead Channel Pacing Threshold Amplitude: 1 V
Lead Channel Pacing Threshold Pulse Width: 0.4 ms
Lead Channel Setting Pacing Amplitude: 2.5 V
Lead Channel Setting Pacing Pulse Width: 0.4 ms
Lead Channel Setting Sensing Sensitivity: 4 mV
MDC IDC LEAD IMPLANT DT: 20090318

## 2018-04-28 ENCOUNTER — Telehealth: Payer: Self-pay | Admitting: *Deleted

## 2018-04-28 NOTE — Telephone Encounter (Signed)
Patient called and stated that she was seen at [facility] and the computer's were down. Patient stated that Dr. Mariea Clonts recommended a bone scan but she has not heard anything regarding this.   Also patient is requesting to DISCONTINUE her Statin. Patient wants to know if she has to tamper or if she can just come off of it.   Please Advise.

## 2018-04-28 NOTE — Telephone Encounter (Signed)
The bone density was ordered when I saw her.  Due to her strokes and TIAs she's had, I don't really recommend she discontinue her statin therapy at this time.

## 2018-04-29 NOTE — Telephone Encounter (Signed)
Patient notified and agreed.  

## 2018-06-02 ENCOUNTER — Ambulatory Visit (INDEPENDENT_AMBULATORY_CARE_PROVIDER_SITE_OTHER): Payer: Medicare Other | Admitting: *Deleted

## 2018-06-02 DIAGNOSIS — I5032 Chronic diastolic (congestive) heart failure: Secondary | ICD-10-CM

## 2018-06-02 DIAGNOSIS — I442 Atrioventricular block, complete: Secondary | ICD-10-CM

## 2018-06-03 ENCOUNTER — Telehealth: Payer: Self-pay

## 2018-06-03 LAB — CUP PACEART REMOTE DEVICE CHECK
Battery Impedance: 182 Ohm
Battery Remaining Longevity: 130 mo
Battery Voltage: 2.8 V
Brady Statistic RV Percent Paced: 99 %
Date Time Interrogation Session: 20200226160107
Implantable Lead Implant Date: 20090318
Implantable Lead Implant Date: 20090318
Implantable Lead Location: 753860
Implantable Lead Model: 5076
Implantable Lead Model: 5092
Implantable Pulse Generator Implant Date: 20160202
Lead Channel Impedance Value: 67 Ohm
Lead Channel Impedance Value: 680 Ohm
Lead Channel Pacing Threshold Amplitude: 1.125 V
Lead Channel Pacing Threshold Pulse Width: 0.4 ms
Lead Channel Setting Pacing Amplitude: 2.5 V
Lead Channel Setting Sensing Sensitivity: 4 mV
MDC IDC LEAD LOCATION: 753859
MDC IDC SET LEADCHNL RV PACING PULSEWIDTH: 0.4 ms

## 2018-06-03 NOTE — Telephone Encounter (Signed)
Left message for patient to remind of missed remote transmission.  

## 2018-06-09 NOTE — Progress Notes (Signed)
Remote pacemaker transmission.   

## 2018-06-22 ENCOUNTER — Other Ambulatory Visit: Payer: Self-pay | Admitting: Internal Medicine

## 2018-06-22 DIAGNOSIS — E039 Hypothyroidism, unspecified: Secondary | ICD-10-CM

## 2018-06-24 ENCOUNTER — Other Ambulatory Visit: Payer: Medicare Other

## 2018-07-22 ENCOUNTER — Encounter: Payer: 59 | Admitting: Internal Medicine

## 2018-07-24 ENCOUNTER — Other Ambulatory Visit: Payer: Self-pay | Admitting: Internal Medicine

## 2018-07-24 DIAGNOSIS — I10 Essential (primary) hypertension: Secondary | ICD-10-CM

## 2018-07-28 ENCOUNTER — Other Ambulatory Visit: Payer: Self-pay | Admitting: *Deleted

## 2018-07-28 MED ORDER — MONTELUKAST SODIUM 10 MG PO TABS: 10.0000 mg | ORAL_TABLET | Freq: Every day | ORAL | 0 refills | Status: DC

## 2018-07-29 ENCOUNTER — Other Ambulatory Visit: Payer: Self-pay

## 2018-07-29 ENCOUNTER — Encounter: Payer: Self-pay | Admitting: Internal Medicine

## 2018-07-29 ENCOUNTER — Non-Acute Institutional Stay: Payer: Medicare Other | Admitting: Internal Medicine

## 2018-07-29 VITALS — BP 120/70 | HR 68 | Temp 98.0°F | Ht 63.0 in | Wt 132.0 lb

## 2018-07-29 DIAGNOSIS — J449 Chronic obstructive pulmonary disease, unspecified: Secondary | ICD-10-CM | POA: Diagnosis not present

## 2018-07-29 DIAGNOSIS — E039 Hypothyroidism, unspecified: Secondary | ICD-10-CM | POA: Diagnosis not present

## 2018-07-29 DIAGNOSIS — I4891 Unspecified atrial fibrillation: Secondary | ICD-10-CM | POA: Diagnosis not present

## 2018-07-29 DIAGNOSIS — D6869 Other thrombophilia: Secondary | ICD-10-CM | POA: Diagnosis not present

## 2018-07-29 DIAGNOSIS — H353130 Nonexudative age-related macular degeneration, bilateral, stage unspecified: Secondary | ICD-10-CM

## 2018-07-29 DIAGNOSIS — I4821 Permanent atrial fibrillation: Secondary | ICD-10-CM | POA: Diagnosis not present

## 2018-07-29 DIAGNOSIS — I5032 Chronic diastolic (congestive) heart failure: Secondary | ICD-10-CM | POA: Diagnosis not present

## 2018-07-29 DIAGNOSIS — I495 Sick sinus syndrome: Secondary | ICD-10-CM

## 2018-07-29 DIAGNOSIS — Z950 Presence of cardiac pacemaker: Secondary | ICD-10-CM

## 2018-07-29 DIAGNOSIS — N183 Chronic kidney disease, stage 3 unspecified: Secondary | ICD-10-CM

## 2018-07-29 NOTE — Progress Notes (Signed)
Location:  Occupational psychologist of Service:  Clinic (12)  Provider: Biddie Sebek L. Mariea Clonts, D.O., C.M.D.  Code Status: DNR Goals of Care:  Advanced Directives 11/12/2017  Does Patient Have a Medical Advance Directive? Yes  Type of Paramedic of [place];Out of facility DNR (pink MOST or yellow form)  Does patient want to make changes to medical advance directive? No - Patient declined  Copy of [place] in Chart? Yes  Would patient like information on creating a medical advance directive? -  Pre-existing out of facility DNR order (yellow form or pink MOST form) Yellow form placed in chart (order not valid for inpatient use)     Chief Complaint  Patient presents with  . Medical Management of Chronic Issues    29mth follow-up    HPI: Patient is a 85 y.o. female seen today for medical management of chronic diseases.  She has a history of afib, orthostatic hypotension, tia s, DVT, sick sinus syndrome with pacemaker, COPD, allergic rhinitis, gerd, hypothyroidism, OA, CKD3, macular degeneration and glaucoma.  She is doing quite well right now from a respiratory perspective with her current inhalers.  Continues to walk for exercise.  She does the exercise programs on tv from [place], also.     She is putting into place all preventive measures for covid-19 including hand hygiene, mask-wearing and social distancing.  She's not going out shopping.  She is taking advantage of delivery options.  Staff are helping her to communicate some with her husband via facetime (he lives in snf here and she's in [place]).    She reports some chronic arthritis pains and stiffness in the mornings but they improve when she gets moving.    Balance has been a little better lately also.  She appeared steadier when she walked back to clinic today.    She denies any chest pain or palpitations.    Her eyes remain poor, but have not acutely changed in the past few  months.    They have a new great grandbaby not far away and she cannot wait to meet him in person.  Past Medical History:  Diagnosis Date  . Anemia   . Aortic stenosis    a. Echo 09/06/12 EF 55-60%, no WMA, G2DD, Ao valve sclerosis w/ mod stenosis, LA mildly dilated, PA pressure 35mmHg  . Asthmatic bronchitis   . Complete heart block [facility])    s/p permanent pacemaker 06/27/1999 (Battery change 06/2007 and 2016).  s/p AV nodal ablation by Dr [name] 2016.  [name] COPD (chronic obstructive pulmonary disease) (HCC)    Dr. [name]  . DDD (degenerative disc disease)   . Diastolic dysfunction    a. Echo 09/06/12 EF 55-60%, no WMA, G2DD, Ao valve sclerosis w/ mod stenosis, LA mildly dilated, PA pressure 82mmHg  . Diverticulosis   . DVT (deep vein thrombosis) in pregnancy 1954   a. LLE  . Hypertension   . Hypothyroidism    on medication  . Macular degeneration    Dr. [name]  . Osteoarthrosis, unspecified whether generalized or localized, other specified sites   . PAF (paroxysmal atrial fibrillation) (HCC)    Stopped flecainide, on amiodarone but still has bouts of A FIB (mostly in mornings).   . Pure hypercholesterolemia   . Staghorn calculus    Left    Past Surgical History:  Procedure Laterality Date  . APPENDECTOMY  ~ 1941  . AV NODE ABLATION  05/10/2014  . AV  NODE ABLATION N/A 05/10/2014   Procedure: AV NODE ABLATION;  Surgeon: Thompson Grayer, MD;  Location: [facility] CATH LAB;  Service: Cardiovascular;  Laterality: N/A;  . BUNIONECTOMY WITH HAMMERTOE RECONSTRUCTION Bilateral ~ 1990  . CARDIAC PACEMAKER PLACEMENT  06/27/99   Medtronic PM implanted by Dr [name]  . CARDIOVERSION N/A 12/02/2013   Procedure: CARDIOVERSION;  Surgeon: [name], MD;  Location: [facility] ENDOSCOPY;  Service: Cardiovascular;  Laterality: N/A;  . CATARACT EXTRACTION W/ INTRAOCULAR LENS  IMPLANT, BILATERAL Bilateral   . COLONOSCOPY WITH PROPOFOL N/A 04/22/2013   Procedure: COLONOSCOPY WITH PROPOFOL;  Surgeon: [name], MD;   Location: [facility] ENDOSCOPY;  Service: Endoscopy;  Laterality: N/A;  . DILATION AND CURETTAGE OF UTERUS  X 2   "when I was going thru menopause"  . ESOPHAGOGASTRODUODENOSCOPY (EGD) WITH PROPOFOL N/A 04/22/2013   Procedure: ESOPHAGOGASTRODUODENOSCOPY (EGD) WITH PROPOFOL;  Surgeon: [name], MD;  Location: [facility] ENDOSCOPY;  Service: Endoscopy;  Laterality: N/A;  . HERNIA REPAIR    . INCISIONAL HERNIA REPAIR    . INSERT / REPLACE / REMOVE PACEMAKER  06/2007   "took out the old; put in new"  . INSERT / REPLACE / REMOVE PACEMAKER  05/10/2014   MDT PPM generator change by Dr [name]  . JOINT REPLACEMENT    . PARTIAL NEPHRECTOMY Left 05/1974   stone disease  . PERMANENT PACEMAKER GENERATOR CHANGE N/A 05/10/2014   Procedure: PERMANENT PACEMAKER GENERATOR CHANGE;  Surgeon: Thompson Grayer, MD;  Location: [facility] CATH LAB;  Service: Cardiovascular;  Laterality: N/A;  . TONSILLECTOMY AND ADENOIDECTOMY  1930's  . TOTAL KNEE ARTHROPLASTY Right 2001    Allergies  Allergen Reactions  . Brimonidine Tartrate-Timolol Other (See Comments)    REACTION: systemic malaise amigen eye drop  . Penicillins Hives  . Breo Ellipta [Fluticasone Furoate-Vilanterol] Other (See Comments)    Ran blood pressure up Increased blood pressure    Outpatient Encounter Medications as of 07/29/2018  Medication Sig  . amLODipine (NORVASC) 5 MG tablet TAKE 1 TABLET BY MOUTH  DAILY  . atorvastatin (LIPITOR) 40 MG tablet TAKE 1 TABLET BY MOUTH ONCE DAILY FOR CHOLESTEROL  . azelastine (ASTELIN) 0.1 % nasal spray USE 1 TO 2 SPRAYS INTO BOTH NOSTRILS TWICE DAILY.  [name] Biotin 5000 MCG CAPS Take 5,000 mg by mouth daily.   . Cholecalciferol (VITAMIN D) 2000 units CAPS Take 1 capsule by mouth daily.  . fluticasone (FLONASE) 50 MCG/ACT nasal spray USE 2 SPRAYS EACH NOSTRIL ONCE A DAY AS NEEDED FOR ALLERGIES OR CONGESTION.  . furosemide (LASIX) 40 MG tablet TAKE 1 TABLET BY MOUTH  DAILY  . gabapentin (NEURONTIN) 100 MG capsule Take 1 capsule (100 mg  total) by mouth at bedtime.  [name] latanoprost (XALATAN) 0.005 % ophthalmic solution Place 1 drop into both eyes at bedtime.  [name] levothyroxine (SYNTHROID, LEVOTHROID) 88 MCG tablet TAKE 1 TABLET BY MOUTH  DAILY BEFORE BREAKFAST  . losartan (COZAAR) 100 MG tablet Take 1 tablet (100 mg total) by mouth daily. At bedtime  . losartan (COZAAR) 50 MG tablet TAKE 1 TABLET BY MOUTH 2  TIMES DAILY. TAKE WITH  100MG  TABLET.  . LUTEIN PO Take 25 mg by mouth daily.   . Magnesium 250 MG TABS Take 1 tablet by mouth daily.  . metoprolol succinate (TOPROL-XL) 50 MG 24 hr tablet Take 1 tablet (50 mg total) by mouth every evening.  . montelukast (SINGULAIR) 10 MG tablet Take 1 tablet (10 mg total) by mouth daily.  . Multiple Vitamins-Minerals (  PRESERVISION AREDS 2) CAPS Take 1 capsule by mouth daily.  . potassium chloride SA (K-DUR,KLOR-CON) 20 MEQ tablet Take 20 mEq by mouth daily.   [name] PROAIR HFA 108 (90 Base) MCG/ACT inhaler USE 2 PUFFS EVERY 6 HOURS AS NEEDED FOR SHORTNESS OF BREATH AND WHEEZING.  . SYMBICORT 160-4.5 MCG/ACT inhaler INHALE 2 PUFFS BY MOUTH  INTO THE LUNGS TWO TIMES  DAILY  . vitamin B-12 (CYANOCOBALAMIN) 1000 MCG tablet Take 1,000 mcg by mouth daily.  Alveda Reasons 15 MG TABS tablet TAKE 1 TABLET BY MOUTH  DAILY WITH SUPPER   No facility-administered encounter medications on file as of 07/29/2018.     Review of Systems:  Review of Systems  Constitutional: Negative for chills, fever and malaise/fatigue.  HENT: Positive for hearing loss. Negative for congestion.   Eyes: Positive for blurred vision.  Respiratory: Positive for cough and shortness of breath. Negative for sputum production and wheezing.        Chronic, but actually better than I've seen her in a long time today  Cardiovascular: Negative for chest pain, palpitations and leg swelling.  Gastrointestinal: Negative for abdominal pain, blood in stool, constipation, diarrhea and melena.  Genitourinary: Negative for dysuria.  Musculoskeletal:  Positive for joint pain. Negative for falls.  Skin: Negative for itching and rash.  Neurological: Negative for dizziness and loss of consciousness.  Endo/Heme/Allergies: Positive for environmental allergies.  Psychiatric/Behavioral: Negative for depression and memory loss. The patient is not nervous/anxious and does not have insomnia.     Health Maintenance  Topic Date Due  . TETANUS/TDAP  02/[birth year]/1947  . DEXA SCAN  02/14/1993  . PNA vac Low Risk Adult (2 of 2 - PCV13) 11/24/2014  . INFLUENZA VACCINE  11/07/2018    Physical Exam: Vitals:   07/29/18 1132  BP: 120/70  Pulse: 68  Temp: 98 F (36.7 C)  TempSrc: Oral  SpO2: 95%  Weight: 132 lb (59.9 kg)  Height: 5\' 3"  (1.6 m)   Body mass index is 23.38 kg/m.socially distant visit with limited exam due to covid-19 pandemic Physical Exam Vitals signs reviewed.  Constitutional:      General: She is not in acute distress.    Appearance: Normal appearance. She is normal weight. She is not ill-appearing or toxic-appearing.  HENT:     Head: Normocephalic and atraumatic.     Ears:     Comments: HOH Eyes:     Comments: glasses  Pulmonary:     Effort: Pulmonary effort is normal.  Abdominal:     General: There is distension.  Musculoskeletal: Normal range of motion.     Comments: Gait appears steadier today even just after standing  Skin:    General: Skin is dry.  Neurological:     General: No focal deficit present.     Mental Status: She is alert and oriented to person, place, and time. Mental status is at baseline.  Psychiatric:        Mood and Affect: Mood normal.     Labs reviewed: Basic Metabolic Panel: Recent Labs    11/04/17 0600 03/09/18 1553  NA 141 142  K 3.8 4.0  CL  --  100  CO2  --  23  GLUCOSE  --  92  BUN 19 27  CREATININE 0.8 1.04*  CALCIUM  --  9.5   Liver Function Tests: Recent Labs    11/04/17 0600  AST 32  ALT 24  ALKPHOS 71   No results for input(s): LIPASE, AMYLASE in  the last 8760  hours. No results for input(s): AMMONIA in the last 8760 hours. CBC: Recent Labs    11/04/17 0600 03/09/18 1553  WBC 6.2 9.0  HGB 12.9 12.9  HCT 40 37.0  MCV  --  94  PLT 250 276   Lipid Panel: Recent Labs    11/04/17 0600  CHOL 138  HDL 70  LDLCALC 58  TRIG 53   Lab Results  Component Value Date   HGBA1C 5.7 (H) 06/25/2017   Assessment/Plan 1. Permanent atrial fibrillation -rate is controlled, has pacer, continues on xarelto anticoagulant, toprol-xl for rate control  2. Hypercoagulable state due to atrial fibrillation (HCC) -cont xarelto therapy, monitor cbc and renal function  3. Chronic diastolic congestive heart failure (HCC) -cont lasix therapy, appears euvolemic at present  4. Sick sinus syndrome [facility]) -s/p pacemaker check 06/03/18--had complete heart block per cardio notes  5. COPD mixed type [facility]) -doing better right now from this perspective than I've seen in years -cont symbicort routinely, singulair, prn proair  6. Hypothyroidism, unspecified type Lab Results  Component Value Date   TSH 1.05 04/30/2016  -clinically and chemically euthyroid at this time with current levothyroxine  7. CKD (chronic kidney disease), stage III (HCC) -Avoid nephrotoxic agents like nsaids, dose adjust renally excreted meds, hydrate. -f/u labs before next visit  8. Bilateral nonexudative age-related macular degeneration, unspecified stage -some continued progression but no dramatic changes lately  9. Cardiac pacemaker in situ -for sick sinus, tachy-brady syndrome  Labs/tests ordered:  Cbc, cmp, flp before Next appt:  11/25/2018  Versa Craton L. Niquita Digioia, D.O. [facility] [address], [place] [address] Cell Phone (Mon-Fri 8am-5pm):  [phone] On Call:  [phone] & follow prompts after 5pm & weekends Office Phone:  [phone] Office Fax:  [phone]

## 2018-08-02 ENCOUNTER — Encounter: Payer: Self-pay | Admitting: Internal Medicine

## 2018-08-08 ENCOUNTER — Other Ambulatory Visit: Payer: Self-pay | Admitting: Cardiology

## 2018-08-28 ENCOUNTER — Telehealth: Payer: Self-pay

## 2018-08-28 NOTE — Telephone Encounter (Signed)
Merrilee Seashore with [facility] called to check the status of Lumbar spine and knee orthosis orders. Merrilee Seashore states he spoke with Lattie Haw on 08/21/2018 yet no return or status update on orders.    I asked Merrilee Seashore to refax paperwork and I will place it in Dr.Reed's review and sign folder. Merrilee Seashore aware Dr.Reed is not in office today and will return on Monday 08/31/2018   I called patient to confirm order request and patient was baffled. Pateint asked that I double check the name and DOB on orders for she has not requested such equipment. I confirmed patients information with 2 patient identifiers and this paperwork was sent allegedly on her behalf. Patient was thankful that I called and asked that we refuse orders.  I made notes on fax that patient did not request and to please remove patient from soliciting list. Order faxed back to [phone]

## 2018-09-02 ENCOUNTER — Ambulatory Visit (INDEPENDENT_AMBULATORY_CARE_PROVIDER_SITE_OTHER): Payer: Medicare Other | Admitting: *Deleted

## 2018-09-02 DIAGNOSIS — I442 Atrioventricular block, complete: Secondary | ICD-10-CM | POA: Diagnosis not present

## 2018-09-02 LAB — CUP PACEART REMOTE DEVICE CHECK
Battery Impedance: 206 Ohm
Battery Remaining Longevity: 126 mo
Battery Voltage: 2.8 V
Brady Statistic RV Percent Paced: 100 %
Date Time Interrogation Session: 20200527122645
Implantable Lead Implant Date: 20090318
Implantable Lead Implant Date: 20090318
Implantable Lead Location: 753859
Implantable Lead Location: 753860
Implantable Lead Model: 5076
Implantable Lead Model: 5092
Implantable Pulse Generator Implant Date: 20160202
Lead Channel Impedance Value: 664 Ohm
Lead Channel Impedance Value: 67 Ohm
Lead Channel Pacing Threshold Amplitude: 1.125 V
Lead Channel Pacing Threshold Pulse Width: 0.4 ms
Lead Channel Setting Pacing Amplitude: 2.5 V
Lead Channel Setting Pacing Pulse Width: 0.4 ms
Lead Channel Setting Sensing Sensitivity: 4 mV

## 2018-09-11 ENCOUNTER — Encounter: Payer: Self-pay | Admitting: Cardiology

## 2018-09-11 NOTE — Progress Notes (Signed)
Remote pacemaker transmission.   

## 2018-09-24 ENCOUNTER — Other Ambulatory Visit: Payer: Self-pay | Admitting: Internal Medicine

## 2018-10-12 ENCOUNTER — Ambulatory Visit: Payer: Medicare Other | Admitting: Internal Medicine

## 2018-10-22 ENCOUNTER — Other Ambulatory Visit: Payer: Self-pay | Admitting: Internal Medicine

## 2018-10-25 ENCOUNTER — Other Ambulatory Visit: Payer: Self-pay | Admitting: Internal Medicine

## 2018-10-27 ENCOUNTER — Other Ambulatory Visit: Payer: Self-pay | Admitting: Internal Medicine

## 2018-10-27 MED ORDER — METOPROLOL SUCCINATE ER 50 MG PO TB24: 50.0000 mg | ORAL_TABLET | Freq: Every evening | ORAL | 0 refills | Status: DC

## 2018-10-28 DIAGNOSIS — Z961 Presence of intraocular lens: Secondary | ICD-10-CM | POA: Diagnosis not present

## 2018-10-28 DIAGNOSIS — H5201 Hypermetropia, right eye: Secondary | ICD-10-CM | POA: Diagnosis not present

## 2018-10-28 DIAGNOSIS — D3132 Benign neoplasm of left choroid: Secondary | ICD-10-CM | POA: Diagnosis not present

## 2018-10-28 DIAGNOSIS — H401112 Primary open-angle glaucoma, right eye, moderate stage: Secondary | ICD-10-CM | POA: Diagnosis not present

## 2018-10-29 ENCOUNTER — Encounter: Payer: Self-pay | Admitting: Internal Medicine

## 2018-10-29 ENCOUNTER — Other Ambulatory Visit: Payer: Self-pay

## 2018-10-29 ENCOUNTER — Ambulatory Visit (INDEPENDENT_AMBULATORY_CARE_PROVIDER_SITE_OTHER): Payer: Medicare Other | Admitting: Internal Medicine

## 2018-10-29 ENCOUNTER — Ambulatory Visit (INDEPENDENT_AMBULATORY_CARE_PROVIDER_SITE_OTHER): Payer: Medicare Other

## 2018-10-29 VITALS — BP 122/70 | HR 74 | Temp 98.2°F | Ht 63.0 in | Wt 136.4 lb

## 2018-10-29 DIAGNOSIS — I5032 Chronic diastolic (congestive) heart failure: Secondary | ICD-10-CM | POA: Diagnosis not present

## 2018-10-29 DIAGNOSIS — J42 Unspecified chronic bronchitis: Secondary | ICD-10-CM | POA: Diagnosis not present

## 2018-10-29 DIAGNOSIS — J209 Acute bronchitis, unspecified: Secondary | ICD-10-CM

## 2018-10-29 DIAGNOSIS — J449 Chronic obstructive pulmonary disease, unspecified: Secondary | ICD-10-CM

## 2018-10-29 MED ORDER — METHYLPREDNISOLONE ACETATE 80 MG/ML IJ SUSP
80.0000 mg | Freq: Once | INTRAMUSCULAR | Status: AC
  Administered 2018-10-29: 80 mg via INTRAMUSCULAR

## 2018-10-29 NOTE — Patient Instructions (Signed)
Order- Depo 34   Dx chronic bronchitis exacerbation  Order- CXR-    Chronic bronchitis exacerbation  We can continue your inhalers  Please cal if we can help

## 2018-10-29 NOTE — Assessment & Plan Note (Signed)
No acute event, but more aware of DOE. Does have some wheezy cough and mild rhonchi with clear sputum. Not sue how much is her bronchitis and how much is her CHF. May need oxygen assessment/ walk test if not easily resolved. Plan- Depo 80, CXR

## 2018-10-29 NOTE — Progress Notes (Signed)
Subjective:    Patient ID: Jody Blake, female    DOB: [birth year], 85 y.o.   MRN: [id number]  HPI F never smoker, followed for allergic rhinitis, chronic bronchitis, complicated by GERD, Hx PAfib/ pacemaker. ------------------------------------------------------------------------------------------------  10/08/2017- 85 year old female never smoker followed for Allergic rhinitis, COPD mixed type, complicated by GERD,  AFib/pacemaker, glaucoma/ macular degen, dCHF, AoStenosis, CKDIII,  -----Has been doing well,she is still coughing up mucus clear in color. No acute respiratory events this winter.  Stable dyspnea on exertion.  No change in chronic productive cough-usually white. Continues Symbicort which is worked well for her, infrequent need for rescue inhaler.  As a lasting nasal spray works well. Her primary concerns are diminished eyesight and hearing, being appropriately followed elsewhere.  10/29/2018- 85 year old female never smoker followed for Allergic rhinitis, COPD mixed type, complicated by GERD,  AFib/pacemaker/ Xarelto, Glaucoma/ macular degen, dCHF, AoStenosis, CKDIII, TIA Seen by NP in November for bronchitis> depo 80, doxycycline, tessalon. Sent Zpak in December. -----pt reports increase SOB w/ exercise, especially in the mornings & congestion in throat area.  Symbicort 160, Proair hfa, Singulair, Avoiding LAMAs due to glaucoma Gradually more aware of some DOE. Not sure if it is hot weather. Feels CHF control is stable on furosemide. Cough prod clear mucus, no fever. CXR 10/08/17-  Chronic lung changes and emphysema without superimposed acute cardiopulmonary disease. Left chest wall cardiac pacing device is unchanged.  ROS-see HPI   + = positive Constitutional:    weight loss, night sweats, fevers, chills, fatigue, lassitude. HEENT:    headaches, difficulty swallowing, tooth/dental problems, sore throat,       sneezing, itching, ear ache, nasal congestion, post nasal drip,  snoring CV:    chest pain, orthopnea, PND, swelling in lower extremities, anasarca,                                                    dizziness, palpitations Resp:   shortness of breath with exertion or at rest.                +productive cough,   +non-productive cough, coughing up of blood.              change in color of mucus.  +wheezing.   Skin:    rash or lesions. GI:  No-   heartburn, indigestion, abdominal pain, nausea, vomiting,  GU:  MS:   joint pain, stiffness,  Neuro-     nothing unusual Psych:  change in mood or affect.  depression or anxiety.   memory loss.  Objective:  OBJ- Physical Exam General- Alert, Oriented, Affect-appropriate, Distress- none acute Skin- rash-none, lesions- none, excoriation- none Lymphadenopathy- none Head- atraumatic            Eyes- Gross vision intact, PERRLA, conjunctivae and secretions clear            Ears- + Hard of hearing            Nose- Clear, no-Septal dev, mucus, polyps, erosion, perforation             Throat- Mallampati II , mucosa clear , drainage- none, tonsils- atrophic Neck- flexible , trachea midline, no stridor , thyroid nl, carotid no bruit Chest - symmetrical excursion , unlabored           Heart/CV- RRR , no murmur ,  no gallop  , no rub, nl s1 s2                           - JVD- none , edema+ elastic hose, stasis changes- none, varices- none           Lung-  Wheeze+ mild, cough+slight , dullness -none, rub- none           Chest wall-+ left pacemaker.  Abd-   Br/ Gen/ Rectal- Not done, not indicated Extrem- cyanosis- none, clubbing, none, atrophy- none, strength- nl Neuro- grossly intact to observation    Assessment & Plan:

## 2018-10-29 NOTE — Assessment & Plan Note (Signed)
She doesn't recognize worsening. Followed by cardiology

## 2018-11-06 DIAGNOSIS — H401131 Primary open-angle glaucoma, bilateral, mild stage: Secondary | ICD-10-CM | POA: Diagnosis not present

## 2018-11-06 DIAGNOSIS — Z961 Presence of intraocular lens: Secondary | ICD-10-CM | POA: Diagnosis not present

## 2018-11-06 DIAGNOSIS — D3132 Benign neoplasm of left choroid: Secondary | ICD-10-CM | POA: Diagnosis not present

## 2018-11-06 DIAGNOSIS — H35371 Puckering of macula, right eye: Secondary | ICD-10-CM | POA: Diagnosis not present

## 2018-11-06 DIAGNOSIS — H353114 Nonexudative age-related macular degeneration, right eye, advanced atrophic with subfoveal involvement: Secondary | ICD-10-CM | POA: Diagnosis not present

## 2018-11-06 DIAGNOSIS — H35363 Drusen (degenerative) of macula, bilateral: Secondary | ICD-10-CM | POA: Diagnosis not present

## 2018-11-06 DIAGNOSIS — H353122 Nonexudative age-related macular degeneration, left eye, intermediate dry stage: Secondary | ICD-10-CM | POA: Diagnosis not present

## 2018-11-25 ENCOUNTER — Non-Acute Institutional Stay: Payer: Medicare Other | Admitting: Internal Medicine

## 2018-11-25 ENCOUNTER — Other Ambulatory Visit: Payer: Self-pay

## 2018-11-25 ENCOUNTER — Encounter: Payer: Self-pay | Admitting: Internal Medicine

## 2018-11-25 VITALS — BP 140/80 | HR 84 | Temp 98.4°F | Ht 63.0 in | Wt 133.0 lb

## 2018-11-25 DIAGNOSIS — J449 Chronic obstructive pulmonary disease, unspecified: Secondary | ICD-10-CM | POA: Diagnosis not present

## 2018-11-25 DIAGNOSIS — R0609 Other forms of dyspnea: Secondary | ICD-10-CM

## 2018-11-25 DIAGNOSIS — I5032 Chronic diastolic (congestive) heart failure: Secondary | ICD-10-CM

## 2018-11-25 DIAGNOSIS — R0600 Dyspnea, unspecified: Secondary | ICD-10-CM

## 2018-11-25 DIAGNOSIS — H9113 Presbycusis, bilateral: Secondary | ICD-10-CM

## 2018-11-25 DIAGNOSIS — N183 Chronic kidney disease, stage 3 unspecified: Secondary | ICD-10-CM

## 2018-11-25 DIAGNOSIS — H353130 Nonexudative age-related macular degeneration, bilateral, stage unspecified: Secondary | ICD-10-CM | POA: Diagnosis not present

## 2018-11-25 NOTE — Progress Notes (Signed)
Location:  Occupational psychologist of Service:  Clinic (12)  Provider: Tarence Searcy L. Mariea Clonts, D.O., C.M.D.  Code Status: DNR Goals of Care:  Advanced Directives 11/12/2017  Does Patient Have a Medical Advance Directive? Yes  Type of Paramedic of [place];Out of facility DNR (pink MOST or yellow form)  Does patient want to make changes to medical advance directive? No - Patient declined  Copy of [place] in Chart? Yes  Would patient like information on creating a medical advance directive? -  Pre-existing out of facility DNR order (yellow form or pink MOST form) Yellow form placed in chart (order not valid for inpatient use)     Chief Complaint  Patient presents with  . Medical Management of Chronic Issues    17mth follow-up    HPI: Patient is a 85 y.o. female seen today for medical management of chronic diseases.    She thinks she is ok outside of not being able to see and hear.  She is still managing at home and actually drives only on [place] but not outside of it.    She is getting more out of breath as she walks and does her exercises.  She does not feel like she is wheezing.  She also saw Dr. [name] and he gave her a prednisone shot which helped her back more than her breathing.  No true improvement in breathing.  She has a continuous drainage down her throat at night especially.  She coughs a little with it.  Does not keep her awake.  No change in sputum color or character.  Her swelling is actually better than usual.  She washed her stockings last night and doesn't have on b/c they were still wet this morning.    Tomorrow is she and John's 73st wedding anniversary.  He's not doing well right now and we spent much time discussing his condition and prognosis.  Her son will be visiting tomorrow and coming to see him.  She is sound sleeper though she worried about Jenny Reichmann some last night so it wasn't a great night.      She saw her ophthalmologist, Dr. Manuella Ghazi, and she's now been offered to participate in a clinical trial for her first visit Friday.  It may involve a genetic test or smart glasses.    She's not been to Dr. [name] due to covid isolation at [facility].  She is going to call to f/u with him b/c of her shortness of breath per my recommendation since she did not improve with her dyspnea after prednisone for her chronic bronchitis.  Last echo was 3/19.  EF 60-65%.  Aortic valve calcification and thickening with mild to moderate stenosis.    She also needs to reschedule her bone density b/c it got canceled due to covid isolation.    Past Medical History:  Diagnosis Date  . Anemia   . Aortic stenosis    a. Echo 09/06/12 EF 55-60%, no WMA, G2DD, Ao valve sclerosis w/ mod stenosis, LA mildly dilated, PA pressure 64mmHg  . Asthmatic bronchitis   . Complete heart block [facility])    s/p permanent pacemaker 06/27/1999 (Battery change 06/2007 and 2016).  s/p AV nodal ablation by Dr [name] 2016.  [name] COPD (chronic obstructive pulmonary disease) (HCC)    Dr. [name]  . DDD (degenerative disc disease)   . Diastolic dysfunction    a. Echo 09/06/12 EF 55-60%, no WMA, G2DD, Ao valve sclerosis w/  mod stenosis, LA mildly dilated, PA pressure 58mmHg  . Diverticulosis   . DVT (deep vein thrombosis) in pregnancy 1954   a. LLE  . Hypertension   . Hypothyroidism    on medication  . Macular degeneration    Dr. [name]  . Osteoarthrosis, unspecified whether generalized or localized, other specified sites   . PAF (paroxysmal atrial fibrillation) (HCC)    Stopped flecainide, on amiodarone but still has bouts of A FIB (mostly in mornings).   . Pure hypercholesterolemia   . Staghorn calculus    Left    Past Surgical History:  Procedure Laterality Date  . APPENDECTOMY  ~ 1941  . AV NODE ABLATION  05/10/2014  . AV NODE ABLATION N/A 05/10/2014   Procedure: AV NODE ABLATION;  Surgeon: Thompson Grayer, MD;  Location: [facility] CATH LAB;  Service:  Cardiovascular;  Laterality: N/A;  . BUNIONECTOMY WITH HAMMERTOE RECONSTRUCTION Bilateral ~ 1990  . CARDIAC PACEMAKER PLACEMENT  06/27/99   Medtronic PM implanted by Dr [name]  . CARDIOVERSION N/A 12/02/2013   Procedure: CARDIOVERSION;  Surgeon: [name], MD;  Location: [facility] ENDOSCOPY;  Service: Cardiovascular;  Laterality: N/A;  . CATARACT EXTRACTION W/ INTRAOCULAR LENS  IMPLANT, BILATERAL Bilateral   . COLONOSCOPY WITH PROPOFOL N/A 04/22/2013   Procedure: COLONOSCOPY WITH PROPOFOL;  Surgeon: [name], MD;  Location: [facility] ENDOSCOPY;  Service: Endoscopy;  Laterality: N/A;  . DILATION AND CURETTAGE OF UTERUS  X 2   "when I was going thru menopause"  . ESOPHAGOGASTRODUODENOSCOPY (EGD) WITH PROPOFOL N/A 04/22/2013   Procedure: ESOPHAGOGASTRODUODENOSCOPY (EGD) WITH PROPOFOL;  Surgeon: [name], MD;  Location: [facility] ENDOSCOPY;  Service: Endoscopy;  Laterality: N/A;  . HERNIA REPAIR    . INCISIONAL HERNIA REPAIR    . INSERT / REPLACE / REMOVE PACEMAKER  06/2007   "took out the old; put in new"  . INSERT / REPLACE / REMOVE PACEMAKER  05/10/2014   MDT PPM generator change by Dr [name]  . JOINT REPLACEMENT    . PARTIAL NEPHRECTOMY Left 05/1974   stone disease  . PERMANENT PACEMAKER GENERATOR CHANGE N/A 05/10/2014   Procedure: PERMANENT PACEMAKER GENERATOR CHANGE;  Surgeon: Thompson Grayer, MD;  Location: [facility] CATH LAB;  Service: Cardiovascular;  Laterality: N/A;  . TONSILLECTOMY AND ADENOIDECTOMY  1930's  . TOTAL KNEE ARTHROPLASTY Right 2001    Allergies  Allergen Reactions  . Brimonidine Tartrate-Timolol Other (See Comments)    REACTION: systemic malaise amigen eye drop  . Penicillins Hives  . Breo Ellipta [Fluticasone Furoate-Vilanterol] Other (See Comments)    Ran blood pressure up Increased blood pressure    Outpatient Encounter Medications as of 11/25/2018  Medication Sig  . amLODipine (NORVASC) 5 MG tablet TAKE 1 TABLET BY MOUTH  DAILY  . atorvastatin (LIPITOR) 40 MG tablet TAKE  1 TABLET BY MOUTH ONCE DAILY FOR CHOLESTEROL  . azelastine (ASTELIN) 0.1 % nasal spray USE 1 TO 2 SPRAYS INTO BOTH NOSTRILS TWICE DAILY.  [name] Biotin 5000 MCG CAPS Take 5,000 mg by mouth daily.   . Cholecalciferol (VITAMIN D) 2000 units CAPS Take 1 capsule by mouth daily.  . fluticasone (FLONASE) 50 MCG/ACT nasal spray USE 2 SPRAYS EACH NOSTRIL ONCE A DAY AS NEEDED FOR ALLERGIES OR CONGESTION.  . furosemide (LASIX) 40 MG tablet TAKE 1 TABLET BY MOUTH  DAILY  . gabapentin (NEURONTIN) 100 MG capsule TAKE 1 CAPSULE BY MOUTH AT  BEDTIME  . latanoprost (XALATAN) 0.005 % ophthalmic solution Place 1 drop into both eyes at  bedtime.  [name] levothyroxine (SYNTHROID, LEVOTHROID) 88 MCG tablet TAKE 1 TABLET BY MOUTH  DAILY BEFORE BREAKFAST  . losartan (COZAAR) 100 MG tablet Take 1 tablet (100 mg total) by mouth daily. At bedtime  . losartan (COZAAR) 50 MG tablet TAKE 1 TABLET BY MOUTH 2  TIMES DAILY. TAKE WITH  100MG  TABLET.  . LUTEIN PO Take 25 mg by mouth daily.   . Magnesium 250 MG TABS Take 1 tablet by mouth daily.  . metoprolol succinate (TOPROL-XL) 50 MG 24 hr tablet Take 1 tablet (50 mg total) by mouth every evening. Please make overdue appt with Dr. [name] before anymore refills. 1st attempt  . montelukast (SINGULAIR) 10 MG tablet TAKE 1 TABLET BY MOUTH  DAILY  . Multiple Vitamins-Minerals (PRESERVISION AREDS 2) CAPS Take 1 capsule by mouth daily.  . potassium chloride SA (K-DUR) 20 MEQ tablet TAKE 1 TABLET BY MOUTH TWICE DAILY.  [name] PROAIR HFA 108 (90 Base) MCG/ACT inhaler USE 2 PUFFS EVERY 6 HOURS AS NEEDED FOR SHORTNESS OF BREATH AND WHEEZING.  . SYMBICORT 160-4.5 MCG/ACT inhaler INHALE 2 PUFFS BY MOUTH  INTO THE LUNGS TWO TIMES  DAILY  . vitamin B-12 (CYANOCOBALAMIN) 1000 MCG tablet Take 1,000 mcg by mouth daily.  Alveda Reasons 15 MG TABS tablet TAKE 1 TABLET BY MOUTH  DAILY WITH SUPPER   No facility-administered encounter medications on file as of 11/25/2018.     Review of Systems:  Review of Systems    Constitutional: Positive for weight loss. Negative for chills, fever and malaise/fatigue.       Down 3 lbs since last visit  HENT: Positive for hearing loss. Negative for congestion.   Eyes: Positive for blurred vision.  Respiratory: Negative for cough and shortness of breath.   Cardiovascular: Positive for leg swelling. Negative for chest pain, palpitations, orthopnea and claudication.  Gastrointestinal: Negative for abdominal pain, blood in stool, constipation, diarrhea and melena.  Genitourinary: Negative for dysuria.  Musculoskeletal: Positive for back pain. Negative for falls and joint pain.       Back pain improved with prednisone  Neurological: Negative for loss of consciousness and headaches.  Endo/Heme/Allergies: Bruises/bleeds easily.  Psychiatric/Behavioral: Negative for depression and memory loss. The patient is not nervous/anxious and does not have insomnia.     Health Maintenance  Topic Date Due  . TETANUS/TDAP  02/15/1947  . DEXA SCAN  02/14/1993  . PNA vac Low Risk Adult (2 of 2 - PCV13) 11/24/2014  . INFLUENZA VACCINE  11/07/2018    Physical Exam: Vitals:   11/25/18 0930  BP: 140/80  Pulse: 84  Temp: 98.4 F (36.9 C)  TempSrc: Oral  SpO2: 97%  Weight: 133 lb (60.3 kg)  Height: 5\' 3"  (1.6 m)   Body mass index is 23.56 kg/m. Physical Exam Vitals signs reviewed.  Constitutional:      General: She is not in acute distress.    Appearance: Normal appearance. She is not toxic-appearing.  HENT:     Head: Normocephalic and atraumatic.     Ears:     Comments: HOH despite hearing aids Eyes:     Comments: Glasses, still struggling to see also  Cardiovascular:     Rate and Rhythm: Rhythm irregular.  Pulmonary:     Effort: Pulmonary effort is normal.     Breath sounds: No wheezing, rhonchi or rales.  Abdominal:     General: Bowel sounds are normal.  Musculoskeletal: Normal range of motion.     Right lower leg: Edema present.  Left lower leg: Edema  present.     Comments: 1+ edema but not in compression hose due to washing them last evening and not dry  Skin:    General: Skin is warm and dry.     Capillary Refill: Capillary refill takes less than 2 seconds.  Neurological:     General: No focal deficit present.     Mental Status: She is alert and oriented to person, place, and time. Mental status is at baseline.     Gait: Gait abnormal.  Psychiatric:        Mood and Affect: Mood normal.     Labs reviewed: Basic Metabolic Panel: Recent Labs    03/09/18 1553  NA 142  K 4.0  CL 100  CO2 23  GLUCOSE 92  BUN 27  CREATININE 1.04*  CALCIUM 9.5   Liver Function Tests: No results for input(s): AST, ALT, ALKPHOS, BILITOT, PROT, ALBUMIN in the last 8760 hours. No results for input(s): LIPASE, AMYLASE in the last 8760 hours. No results for input(s): AMMONIA in the last 8760 hours. CBC: Recent Labs    03/09/18 1553  WBC 9.0  HGB 12.9  HCT 37.0  MCV 94  PLT 276   Lipid Panel: No results for input(s): CHOL, HDL, LDLCALC, TRIG, CHOLHDL, LDLDIRECT in the last 8760 hours. Lab Results  Component Value Date   HGBA1C 5.7 (H) 06/25/2017    Procedures since last visit: Dg Chest 2 View  Result Date: 10/29/2018 CLINICAL DATA:  Exacerbation of chronic bronchitis. History of asthma and hypertension and COPD. EXAM: CHEST - 2 VIEW COMPARISON:  10/08/2017 and older studies. FINDINGS: Cardiac silhouette is mildly enlarged. Left anterior chest wall sequential pacemaker is stable and well positioned. No mediastinal or hilar masses or evidence of adenopathy. Lungs are hyperexpanded but clear. No pleural effusion or pneumothorax. Skeletal structures are demineralized but intact. IMPRESSION: No acute cardiopulmonary disease. Electronically Signed   By: Lajean Manes M.D.   On: 10/29/2018 15:08    Assessment/Plan 1. Dyspnea on exertion -may be mixed etiology but needs cardiac reassessment -already seen by pulmonary and steroid did not  improve symptoms  2. Chronic diastolic congestive heart failure ([place]) -last echo in 6/21 w/o any systolic dysfunction, weight actually down, but dypsnea on exertion worse and has not seen cardiology for regular visit due to covid lockdown here -will arrange f/u now  3. COPD mixed type ([place]) -prednisone did not affect her breathing so suspect that sob is due to #2  4. CKD (chronic kidney disease), stage III (HCC) -Avoid nephrotoxic agents like nsaids, dose adjust renally excreted meds, hydrate.  5. Bilateral nonexudative age-related macular degeneration, unspecified stage -progressive, follows with retina specialist -may be participating in a clinical trial -unable to read at all  6. Presbycusis of both ears -severe hearing loss not fully resolved with hearing aids -has amplifier on one home phone which helps her some, but still struggled to hear me when I called her yesterday about her husband  Labs/tests ordered:  No new Next appt:  02/24/2019   Trinika Cortese L. Cameo Shewell, D.O. [facility] [address], [place] [address] Cell Phone (Mon-Fri 8am-5pm):  [phone] On Call:  [phone] & follow prompts after 5pm & weekends Office Phone:  [phone] Office Fax:  [phone]

## 2018-11-25 NOTE — Patient Instructions (Signed)
Please follow-up with cardiology.  I'm concerned that your heart may be the reason for your increased shortness of breath.  Also, please reschedule your bone density (that is not as urgent).

## 2018-12-02 ENCOUNTER — Ambulatory Visit (INDEPENDENT_AMBULATORY_CARE_PROVIDER_SITE_OTHER): Payer: Medicare Other | Admitting: *Deleted

## 2018-12-02 DIAGNOSIS — I442 Atrioventricular block, complete: Secondary | ICD-10-CM

## 2018-12-02 LAB — CUP PACEART REMOTE DEVICE CHECK
Battery Impedance: 230 Ohm
Battery Remaining Longevity: 122 mo
Battery Voltage: 2.8 V
Brady Statistic RV Percent Paced: 100 %
Date Time Interrogation Session: 20200826173034
Implantable Lead Implant Date: 20090318
Implantable Lead Implant Date: 20090318
Implantable Lead Location: 753859
Implantable Lead Location: 753860
Implantable Lead Model: 5076
Implantable Lead Model: 5092
Implantable Pulse Generator Implant Date: 20160202
Lead Channel Impedance Value: 67 Ohm
Lead Channel Impedance Value: 680 Ohm
Lead Channel Pacing Threshold Amplitude: 1.125 V
Lead Channel Pacing Threshold Pulse Width: 0.4 ms
Lead Channel Setting Pacing Amplitude: 2.5 V
Lead Channel Setting Pacing Pulse Width: 0.4 ms
Lead Channel Setting Sensing Sensitivity: 4 mV

## 2018-12-07 ENCOUNTER — Other Ambulatory Visit: Payer: Self-pay | Admitting: Internal Medicine

## 2018-12-15 ENCOUNTER — Encounter: Payer: Self-pay | Admitting: Cardiology

## 2018-12-15 NOTE — Progress Notes (Signed)
Remote pacemaker transmission.   

## 2018-12-21 ENCOUNTER — Other Ambulatory Visit: Payer: Self-pay

## 2018-12-21 ENCOUNTER — Telehealth (INDEPENDENT_AMBULATORY_CARE_PROVIDER_SITE_OTHER): Payer: Medicare Other | Admitting: Internal Medicine

## 2018-12-21 ENCOUNTER — Encounter: Payer: Self-pay | Admitting: Internal Medicine

## 2018-12-21 ENCOUNTER — Telehealth: Payer: Self-pay

## 2018-12-21 VITALS — BP 141/88 | HR 85 | Ht 63.0 in | Wt 132.0 lb

## 2018-12-21 DIAGNOSIS — I4821 Permanent atrial fibrillation: Secondary | ICD-10-CM

## 2018-12-21 DIAGNOSIS — I442 Atrioventricular block, complete: Secondary | ICD-10-CM

## 2018-12-21 DIAGNOSIS — R0602 Shortness of breath: Secondary | ICD-10-CM

## 2018-12-21 DIAGNOSIS — I5033 Acute on chronic diastolic (congestive) heart failure: Secondary | ICD-10-CM

## 2018-12-21 NOTE — Progress Notes (Signed)
Electrophysiology TeleHealth Note  Due to national recommendations of social distancing due to [place] 19, an audio telehealth visit is felt to be most appropriate for this patient at this time.  Verbal consent was obtained by me for the telehealth visit today.  The patient does not have capability for a virtual visit.  A phone visit is therefore required today.   Date:  12/21/2018   ID:  Jody Blake, DOB 1925/12/31, MRN [id number]  Location: patient's home  Provider location:  [facility]  Evaluation Performed: Follow-up visit  PCP:  Gayland Curry, DO   Electrophysiologist:  Dr [name]  Chief Complaint:  palpitations  History of Present Illness:    Jody Blake is a 85 y.o. female who presents via telehealth conferencing today.  Since last being seen in our clinic, the patient reports doing reasonably well. She reports worsening SOB over the past few months.  She has noticed that walking in the halls at [place] she has significant SOB.  + orthopnea.  + edema  Today, she denies symptoms of palpitations, chest pain,  dizziness, presyncope, or syncope.  The patient is otherwise without complaint today.  The patient denies symptoms of fevers, chills, cough, or new SOB worrisome for COVID 19.  Past Medical History:  Diagnosis Date  . Anemia   . Aortic stenosis    a. Echo 09/06/12 EF 55-60%, no WMA, G2DD, Ao valve sclerosis w/ mod stenosis, LA mildly dilated, PA pressure 42mmHg  . Asthmatic bronchitis   . Complete heart block [facility])    s/p permanent pacemaker 06/27/1999 (Battery change 06/2007 and 2016).  s/p AV nodal ablation by Dr [name] 2016.  [name] COPD (chronic obstructive pulmonary disease) (HCC)    Dr. [name]  . DDD (degenerative disc disease)   . Diastolic dysfunction    a. Echo 09/06/12 EF 55-60%, no WMA, G2DD, Ao valve sclerosis w/ mod stenosis, LA mildly dilated, PA pressure 24mmHg  . Diverticulosis   . DVT (deep vein thrombosis) in pregnancy 1954   a. LLE  .  Hypertension   . Hypothyroidism    on medication  . Macular degeneration    Dr. [name]  . Osteoarthrosis, unspecified whether generalized or localized, other specified sites   . PAF (paroxysmal atrial fibrillation) (HCC)    Stopped flecainide, on amiodarone but still has bouts of A FIB (mostly in mornings).   . Pure hypercholesterolemia   . Staghorn calculus    Left    Past Surgical History:  Procedure Laterality Date  . APPENDECTOMY  ~ 1941  . AV NODE ABLATION  05/10/2014  . AV NODE ABLATION N/A 05/10/2014   Procedure: AV NODE ABLATION;  Surgeon: Thompson Grayer, MD;  Location: [facility] CATH LAB;  Service: Cardiovascular;  Laterality: N/A;  . BUNIONECTOMY WITH HAMMERTOE RECONSTRUCTION Bilateral ~ 1990  . CARDIAC PACEMAKER PLACEMENT  06/27/99   Medtronic PM implanted by Dr [name]  . CARDIOVERSION N/A 12/02/2013   Procedure: CARDIOVERSION;  Surgeon: [name], MD;  Location: [facility] ENDOSCOPY;  Service: Cardiovascular;  Laterality: N/A;  . CATARACT EXTRACTION W/ INTRAOCULAR LENS  IMPLANT, BILATERAL Bilateral   . COLONOSCOPY WITH PROPOFOL N/A 04/22/2013   Procedure: COLONOSCOPY WITH PROPOFOL;  Surgeon: [name], MD;  Location: [facility] ENDOSCOPY;  Service: Endoscopy;  Laterality: N/A;  . DILATION AND CURETTAGE OF UTERUS  X 2   "when I was going thru menopause"  . ESOPHAGOGASTRODUODENOSCOPY (EGD) WITH PROPOFOL N/A 04/22/2013   Procedure: ESOPHAGOGASTRODUODENOSCOPY (EGD) WITH PROPOFOL;  Surgeon: [name], MD;  Location: [facility] ENDOSCOPY;  Service: Endoscopy;  Laterality: N/A;  . HERNIA REPAIR    . INCISIONAL HERNIA REPAIR    . INSERT / REPLACE / REMOVE PACEMAKER  06/2007   "took out the old; put in new"  . INSERT / REPLACE / REMOVE PACEMAKER  05/10/2014   MDT PPM generator change by Dr [name]  . JOINT REPLACEMENT    . PARTIAL NEPHRECTOMY Left 05/1974   stone disease  . PERMANENT PACEMAKER GENERATOR CHANGE N/A 05/10/2014   Procedure: PERMANENT PACEMAKER GENERATOR CHANGE;  Surgeon: Thompson Grayer, MD;   Location[facility] CATH LAB;  Service: Cardiovascular;  Laterality: N/A;  . TONSILLECTOMY AND ADENOIDECTOMY  1930's  . TOTAL KNEE ARTHROPLASTY Right 2001    Current Outpatient Medications  Medication Sig Dispense Refill  . amLODipine (NORVASC) 5 MG tablet TAKE 1 TABLET BY MOUTH  DAILY 90 tablet 3  . atorvastatin (LIPITOR) 40 MG tablet TAKE 1 TABLET BY MOUTH ONCE DAILY FOR CHOLESTEROL 90 tablet 1  . azelastine (ASTELIN) 0.1 % nasal spray USE 1 TO 2 SPRAYS INTO BOTH NOSTRILS TWICE DAILY. 30 mL PRN  . Biotin 5000 MCG CAPS Take 5,000 mg by mouth daily.     . Cholecalciferol (VITAMIN D) 2000 units CAPS Take 1 capsule by mouth daily.    . fluticasone (FLONASE) 50 MCG/ACT nasal spray USE 2 SPRAYS EACH NOSTRIL ONCE A DAY AS NEEDED FOR ALLERGIES OR CONGESTION. 16 g PRN  . furosemide (LASIX) 40 MG tablet TAKE 1 TABLET BY MOUTH  DAILY 90 tablet 1  . gabapentin (NEURONTIN) 100 MG capsule TAKE 1 CAPSULE BY MOUTH AT  BEDTIME 90 capsule 3  . latanoprost (XALATAN) 0.005 % ophthalmic solution Place 1 drop into both eyes at bedtime.    [name] levothyroxine (SYNTHROID, LEVOTHROID) 88 MCG tablet TAKE 1 TABLET BY MOUTH  DAILY BEFORE BREAKFAST 90 tablet 1  . losartan (COZAAR) 100 MG tablet Take 1 tablet (100 mg total) by mouth daily. At bedtime 90 tablet 3  . losartan (COZAAR) 50 MG tablet TAKE 1 TABLET BY MOUTH 2  TIMES DAILY. TAKE WITH  100MG  TABLET. 180 tablet 1  . LUTEIN PO Take 25 mg by mouth daily.     . Magnesium 250 MG TABS Take 1 tablet by mouth daily.    . metoprolol succinate (TOPROL-XL) 50 MG 24 hr tablet Take 1 tablet (50 mg total) by mouth every evening. Please make overdue appt with Dr. [name] before anymore refills. 1st attempt 30 tablet 0  . montelukast (SINGULAIR) 10 MG tablet TAKE 1 TABLET BY MOUTH  DAILY 90 tablet 0  . Multiple Vitamins-Minerals (PRESERVISION AREDS 2) CAPS Take 1 capsule by mouth daily.    . potassium chloride SA (K-DUR) 20 MEQ tablet TAKE 1 TABLET BY MOUTH TWICE DAILY. 60 tablet 6  .  PROAIR HFA 108 (90 Base) MCG/ACT inhaler USE 2 PUFFS EVERY 6 HOURS AS NEEDED FOR SHORTNESS OF BREATH AND WHEEZING. 8.5 g 0  . SYMBICORT 160-4.5 MCG/ACT inhaler INHALE 2 PUFFS BY MOUTH  INTO THE LUNGS TWO TIMES  DAILY 30.6 g 2  . vitamin B-12 (CYANOCOBALAMIN) 1000 MCG tablet Take 1,000 mcg by mouth daily.    Alveda Reasons 15 MG TABS tablet TAKE 1 TABLET BY MOUTH  DAILY WITH SUPPER 90 tablet 4   No current facility-administered medications for this visit.     Allergies:   Brimonidine tartrate-timolol, Penicillins, and Breo ellipta [fluticasone furoate-vilanterol]   Social History:  The patient  reports that she has never smoked. She has never used smokeless tobacco. She reports current alcohol use of about 7.0 standard drinks of alcohol per week. She reports that she does not use drugs.   Family History:  The patient's family history includes Breast cancer in her mother; Heart attack in her brother; Hypertension in her father; Malignant hypertension in her father; Renal Disease in her father; Stroke in her brother.   ROS:  Please see the history of present illness.   All other systems are personally reviewed and negative.    Exam:    Vital Signs:  BP (!) 141/88   Pulse 85   Ht 5\' 3"  (1.6 m)   Wt 132 lb (59.9 kg)   LMP 04/08/1974   BMI 23.38 kg/m   Well sounding, alert and conversant   Labs/Other Tests and Data Reviewed:    Recent Labs: 03/09/2018: BUN 27; Creatinine, Ser 1.04; Hemoglobin 12.9; Platelets 276; Potassium 4.0; Sodium 142   Wt Readings from Last 3 Encounters:  12/21/18 132 lb (59.9 kg)  11/25/18 133 lb (60.3 kg)  10/29/18 136 lb 6.4 oz (61.9 kg)     Last device remote is reviewed from Curry PDF which reveals normal device function,    ASSESSMENT & PLAN:    1.  Complete heart block Normal device function Remotes are uptodate  2. Permanent afib S/p AV nodal ablation On xarelto for chads2vasc score of 4  3. HTN Stable No change required today  4. Acute on  chronic diastolic dysfunction More prominent symptoms recent 2 gram sodium diet is advised Increase lasix to 40mg  BID x 3 days then resume prior dosing  5. SOB Likely multifactoral and related to chronic bronchitis and diastolic dysfunction Increase lasix x 3 days (as above) 2 gram sodium restriction myoview to evaluate for ischemia  Follow-up with Dr Marlou Porch or his APP after [facility] Return to EP clinic in a year   Patient Risk:  after full review of this patients clinical status, I feel that they are at moderate risk at this time.  Today, I have spent 15 minutes with the patient with telehealth technology discussing arrhythmia management .    Army Fossa, MD  12/21/2018 3:28 PM     [facility] [address] [place] [address] [phone] (office) [phone] (fax)

## 2018-12-21 NOTE — Telephone Encounter (Signed)
-----   Message from Thompson Grayer, MD sent at 12/21/2018  3:34 PM EDT ----- Please schedule lexiscan myoview to evaluate SOB Follow-up with Dr Marlou Porch or his APP after stress test

## 2018-12-22 ENCOUNTER — Telehealth (HOSPITAL_COMMUNITY): Payer: Self-pay

## 2018-12-22 NOTE — Telephone Encounter (Signed)
Spoke with the patient, instructions given. She stated that she would be here for her test. Asked to call back with any questions. Jody Blake EMTP 

## 2018-12-24 ENCOUNTER — Other Ambulatory Visit: Payer: Self-pay

## 2018-12-24 ENCOUNTER — Ambulatory Visit (HOSPITAL_COMMUNITY): Payer: Medicare Other | Attending: Cardiovascular Disease

## 2018-12-24 VITALS — Ht 63.0 in | Wt 133.0 lb

## 2018-12-24 DIAGNOSIS — R0602 Shortness of breath: Secondary | ICD-10-CM | POA: Diagnosis not present

## 2018-12-24 DIAGNOSIS — R110 Nausea: Secondary | ICD-10-CM | POA: Diagnosis not present

## 2018-12-24 LAB — MYOCARDIAL PERFUSION IMAGING
LV dias vol: 58 mL (ref 46–106)
LV sys vol: 24 mL
Peak HR: 67 {beats}/min
Rest HR: 65 {beats}/min
SDS: 2
SRS: 0
SSS: 2
TID: 1.28

## 2018-12-24 MED ORDER — REGADENOSON 0.4 MG/5ML IV SOLN
0.4000 mg | Freq: Once | INTRAVENOUS | Status: AC
  Administered 2018-12-24: 0.4 mg via INTRAVENOUS

## 2018-12-24 MED ORDER — TECHNETIUM TC 99M TETROFOSMIN IV KIT
32.7000 | PACK | Freq: Once | INTRAVENOUS | Status: AC | PRN
  Administered 2018-12-24: 32.7 via INTRAVENOUS
  Filled 2018-12-24: qty 33

## 2018-12-24 MED ORDER — AMINOPHYLLINE 25 MG/ML IV SOLN
75.0000 mg | Freq: Once | INTRAVENOUS | Status: AC
  Administered 2018-12-24: 75 mg via INTRAVENOUS

## 2018-12-24 MED ORDER — TECHNETIUM TC 99M TETROFOSMIN IV KIT
10.3000 | PACK | Freq: Once | INTRAVENOUS | Status: AC | PRN
  Administered 2018-12-24: 10.3 via INTRAVENOUS
  Filled 2018-12-24: qty 11

## 2018-12-28 NOTE — Telephone Encounter (Signed)
Resending to scheduling

## 2018-12-30 NOTE — Telephone Encounter (Signed)
appt scheduled 10/6

## 2019-01-11 NOTE — Progress Notes (Deleted)
Cardiology Office Note:    Date:  01/11/2019   ID:  Jody Blake, DOB [birth year], MRN [id number]  PCP:  Jody Curry, DO  Cardiologist:  Candee Furbish, MD  Electrophysiologist:  None   Referring MD: Jody Curry, DO     History of Present Illness:    Jody Blake is a 85 y.o. female here for follow-up of pacemaker, complete heart block. - Had AV nodal ablation secondary to atrial fibrillation.  Could not control with antiarrhythmics.  I take care of her son as well.  Overall she has been battling a persistent cough.  Bothering her extensively at night.  Her prior echocardiogram just performed in March showed normal ejection fraction.  Mild aortic stenosis.  01/12/2019-here for the follow-up of atrial fibrillation and shortness of breath.  She had a nuclear stress test performed because of shortness of breath concerning for anginal equivalent.  This was ordered by Dr. Rayann Blake after his last telemedicine visit.  Thankfully, her stress test came back reassuring with no ischemia.  Normal ejection fraction.   Past Medical History:  Diagnosis Date  . Anemia   . Aortic stenosis    a. Echo 09/06/12 EF 55-60%, no WMA, G2DD, Ao valve sclerosis w/ mod stenosis, LA mildly dilated, PA pressure 35mmHg  . Asthmatic bronchitis   . Complete heart block [facility])    s/p permanent pacemaker 06/27/1999 (Battery change 06/2007 and 2016).  s/p AV nodal ablation by Dr Jody Blake 2016.  [name] COPD (chronic obstructive pulmonary disease) (HCC)    Dr. [name]  . DDD (degenerative disc disease)   . Diastolic dysfunction    a. Echo 09/06/12 EF 55-60%, no WMA, G2DD, Ao valve sclerosis w/ mod stenosis, LA mildly dilated, PA pressure 47mmHg  . Diverticulosis   . DVT (deep vein thrombosis) in pregnancy 1954   a. LLE  . Hypertension   . Hypothyroidism    on medication  . Macular degeneration    Dr. [name]  . Osteoarthrosis, unspecified whether generalized or localized, other specified sites   . PAF (paroxysmal atrial  fibrillation) (HCC)    Stopped flecainide, on amiodarone but still has bouts of A FIB (mostly in mornings).   . Pure hypercholesterolemia   . Staghorn calculus    Left    Past Surgical History:  Procedure Laterality Date  . APPENDECTOMY  ~ 1941  . AV NODE ABLATION  05/10/2014  . AV NODE ABLATION N/A 05/10/2014   Procedure: AV NODE ABLATION;  Surgeon: Thompson Grayer, MD;  Location: [facility] CATH LAB;  Service: Cardiovascular;  Laterality: N/A;  . BUNIONECTOMY WITH HAMMERTOE RECONSTRUCTION Bilateral ~ 1990  . CARDIAC PACEMAKER PLACEMENT  06/27/99   Medtronic PM implanted by Dr Jody Blake  . CARDIOVERSION N/A 12/02/2013   Procedure: CARDIOVERSION;  Surgeon: Jody Records, MD;  Location: [facility] ENDOSCOPY;  Service: Cardiovascular;  Laterality: N/A;  . CATARACT EXTRACTION W/ INTRAOCULAR LENS  IMPLANT, BILATERAL Bilateral   . COLONOSCOPY WITH PROPOFOL N/A 04/22/2013   Procedure: COLONOSCOPY WITH PROPOFOL;  Surgeon: Jody Fair, MD;  Location: [facility] ENDOSCOPY;  Service: Endoscopy;  Laterality: N/A;  . DILATION AND CURETTAGE OF UTERUS  X 2   "when I was going thru menopause"  . ESOPHAGOGASTRODUODENOSCOPY (EGD) WITH PROPOFOL N/A 04/22/2013   Procedure: ESOPHAGOGASTRODUODENOSCOPY (EGD) WITH PROPOFOL;  Surgeon: Jody Fair, MD;  Location: [facility] ENDOSCOPY;  Service: Endoscopy;  Laterality: N/A;  . HERNIA REPAIR    . INCISIONAL HERNIA REPAIR    . INSERT / REPLACE /  REMOVE PACEMAKER  06/2007   "took out the old; put in new"  . INSERT / REPLACE / REMOVE PACEMAKER  05/10/2014   MDT PPM generator change by Dr Jody Blake  . JOINT REPLACEMENT    . PARTIAL NEPHRECTOMY Left 05/1974   stone disease  . PERMANENT PACEMAKER GENERATOR CHANGE N/A 05/10/2014   Procedure: PERMANENT PACEMAKER GENERATOR CHANGE;  Surgeon: Thompson Grayer, MD;  Location: [facility] CATH LAB;  Service: Cardiovascular;  Laterality: N/A;  . TONSILLECTOMY AND ADENOIDECTOMY  1930's  . TOTAL KNEE ARTHROPLASTY Right 2001    Current Medications: No outpatient medications  have been marked as taking for the 01/12/19 encounter (Appointment) with Jerline Pain, MD.     Allergies:   Brimonidine tartrate-timolol, Penicillins, and Breo ellipta [fluticasone furoate-vilanterol]   Social History   Socioeconomic History  . Marital status: Married    Spouse name: Not on file  . Number of children: 2  . Years of education: Not on file  . Highest education level: Not on file  Occupational History  . Occupation: RETIRED    Employer: RETIRED    Comment: Family [place]  . Financial resource strain: Not hard at all  . Food insecurity    Worry: Never true    Inability: Never true  . Transportation needs    Medical: No    Non-medical: No  Tobacco Use  . Smoking status: Never Smoker  . Smokeless tobacco: Never Used  Substance and Sexual Activity  . Alcohol use: Yes    Alcohol/week: 7.0 standard drinks    Types: 7 Glasses of wine per week    Comment: occasionally  . Drug use: No  . Sexual activity: Not Currently    Partners: Male  Lifestyle  . Physical activity    Days per week: 7 days    Minutes per session: 30 min  . Stress: Not at all  Relationships  . Social connections    Talks on phone: More than three times a week    Gets together: More than three times a week    Attends religious service: More than 4 times per year    Active member of club or organization: Yes    Attends meetings of clubs or organizations: More than 4 times per year    Relationship status: Married  Other Topics Concern  . Not on file  Social History Narrative   Diet? Low salt, low fat      Do you drink/eat things with caffeine? yes      Marital status?                 married                   What year were you married? 1949      Do you live in a house, apartment, assisted living, condo, trailer, etc.? apartment      Is it one or more stories? 3 stories      How many persons live in your home? Just me      Do you have any pets in your home?  (please list) no      Current or past profession: accounting      Do you exercise?          yes                            Type & how often?  Walk, class, prescribed daily      Do you have a living will? yes      Do you have a DNR form?     yes                             If not, do you want to discuss one?      Do you have signed POA/HPOA for forms? no           Family History: The patient's family history includes Breast cancer in her mother; Heart attack in her brother; Hypertension in her father; Malignant hypertension in her father; Renal Disease in her father; Stroke in her brother.  ROS:   Please see the history of present illness.    Positive for cough all other systems reviewed and are negative.  EKGs/Labs/Other Studies Reviewed:    The following studies were reviewed today: Prior office notes, echocardiogram EKG reviewed  Nuclear stress test on 12/24/2018 was performed:  Nuclear stress EF: 58%.  No T wave inversion was noted during stress.  There was no ST segment deviation noted during stress.  This is a low risk study.   No reversible ischemia. LVEF 58% with normal wall motion. This is a low risk study.  EKG:  EKG is  ordered today.  The ekg ordered today demonstrates March 09, 2018 ventricular paced rhythm 64 atrial fibrillation underlying personally reviewed  Recent Labs: 03/09/2018: BUN 27; Creatinine, Ser 1.04; Hemoglobin 12.9; Platelets 276; Potassium 4.0; Sodium 142  Recent Lipid Panel    Component Value Date/Time   CHOL 138 11/04/2017 0600   TRIG 53 11/04/2017 0600   HDL 70 11/04/2017 0600   CHOLHDL 3.1 06/25/2017 0229   VLDL 11 06/25/2017 0229   LDLCALC 58 11/04/2017 0600    Physical Exam:    VS:  LMP 04/08/1974     Wt Readings from Last 3 Encounters:  12/24/18 133 lb (60.3 kg)  12/21/18 132 lb (59.9 kg)  11/25/18 133 lb (60.3 kg)     GEN:  Well nourished, well developed in no acute distress HEENT: Normal NECK: No JVD; No carotid  bruits LYMPHATICS: No lymphadenopathy CARDIAC: RRR, no murmurs, rubs, gallops RESPIRATORY: Mild wheeze bilaterally ABDOMEN: Soft, non-tender, non-distended MUSCULOSKELETAL:  No edema; No deformity  SKIN: Warm and dry NEUROLOGIC:  Alert and oriented x 3 PSYCHIATRIC:  Normal affect   ASSESSMENT:    1. Shortness of breath   2. Complete heart block (HCC)   3. Permanent atrial fibrillation (HCC)   4. Cardiac pacemaker in situ    PLAN:    In order of problems listed above:  Permanent atrial fibrillation with pacemaker placement after complete heart block following AV nodal ablation - Doing well.  64 bpm on ECG.  No changes. -Dr. Rayann Blake has been monitoring  Chronic anticoagulation - Xarelto reduced dose 15 mg.  No bleeding.  We will check a CBC and a basic metabolic profile  Essential hypertension -Mildly elevated today.  Continue to monitor.  History of TIA x2.  Chronic bronchitis/emphysema - Her cough still is plaguing her.  Perhaps empiric Pepcid may help.  I did ask her to call back Dr. Janee Morn team to see if there is any other suggestions.  Chest x-ray personally reviewed.  Medication Adjustments/Labs and Tests Ordered: Current medicines are reviewed at length with the patient today.  Concerns regarding medicines are outlined above.  No orders of the defined types were  placed in this encounter.  No orders of the defined types were placed in this encounter.   There are no Patient Instructions on file for this visit.   Signed, Candee Furbish, MD  01/11/2019 1:43 PM    [facility]

## 2019-01-12 ENCOUNTER — Ambulatory Visit: Payer: Medicare Other | Admitting: Cardiology

## 2019-01-12 ENCOUNTER — Other Ambulatory Visit: Payer: Self-pay

## 2019-01-12 ENCOUNTER — Encounter: Payer: Self-pay | Admitting: Nurse Practitioner

## 2019-01-12 ENCOUNTER — Ambulatory Visit (INDEPENDENT_AMBULATORY_CARE_PROVIDER_SITE_OTHER): Payer: Medicare Other | Admitting: Nurse Practitioner

## 2019-01-12 VITALS — BP 144/90 | HR 83 | Ht 63.0 in | Wt 137.0 lb

## 2019-01-12 DIAGNOSIS — Z950 Presence of cardiac pacemaker: Secondary | ICD-10-CM | POA: Diagnosis not present

## 2019-01-12 DIAGNOSIS — R0602 Shortness of breath: Secondary | ICD-10-CM

## 2019-01-12 DIAGNOSIS — I442 Atrioventricular block, complete: Secondary | ICD-10-CM | POA: Diagnosis not present

## 2019-01-12 DIAGNOSIS — I4821 Permanent atrial fibrillation: Secondary | ICD-10-CM

## 2019-01-12 DIAGNOSIS — I5032 Chronic diastolic (congestive) heart failure: Secondary | ICD-10-CM | POA: Diagnosis not present

## 2019-01-12 DIAGNOSIS — I350 Nonrheumatic aortic (valve) stenosis: Secondary | ICD-10-CM | POA: Diagnosis not present

## 2019-01-12 NOTE — Progress Notes (Signed)
CARDIOLOGY OFFICE NOTE  Date:  01/12/2019    [name] Date of Birth: 1925/11/17 Medical Record [id number]  PCP:  Gayland Curry, DO  Cardiologist:  Marlou Porch & Allred  Chief Complaint  Patient presents with  . Follow-up    Post Myoview - seen for Dr. Marlou Porch    History of Present Illness: Jody Blake is a 85 y.o. female who presents today for a follow up visit. Seen for Dr. Marlou Porch. She sees Dr. [name] for EP.   She has a history of CHB - s/p PPM - followed by Dr. [name]. Has also had AV nodal ablation secondary to AF that was not controllable with antiarrhythmics.   Last seen by Dr. Marlou Porch in December of 2019 - persistent cough noted. Prior echo with mild AS and normal EF noted.   Seen for a telehealth visit with Dr. [name] last month - endorsing more dyspnea over the past few months with orthopnea and edema. Myoview was updated to rule out ischemia.   The patient does not have symptoms concerning for COVID-19 infection (fever, chills, cough, or new shortness of breath).   Comes in today. Here alone. I know her son and daughter in law - Jody Blake and Jody Blake. She continues to be short of breath with exertion. She is trying to keep walking - stays inside due to fear of falling and walks the long halls at [employer] No chest pain. Not dizzy. Her stress test looked good and was reviewed with her. No recent labs. She remains on Xarelto - the lower dose. No bleeding/bruising.   Past Medical History:  Diagnosis Date  . Anemia   . Aortic stenosis    a. Echo 09/06/12 EF 55-60%, no WMA, G2DD, Ao valve sclerosis w/ mod stenosis, LA mildly dilated, PA pressure 21mmHg  . Asthmatic bronchitis   . Complete heart block [facility])    s/p permanent pacemaker 06/27/1999 (Battery change 06/2007 and 2016).  s/p AV nodal ablation by Dr [name] 2016.  [name] COPD (chronic obstructive pulmonary disease) (HCC)    Dr. [name]  . DDD (degenerative disc disease)   . Diastolic dysfunction    a. Echo 09/06/12 EF  55-60%, no WMA, G2DD, Ao valve sclerosis w/ mod stenosis, LA mildly dilated, PA pressure 9mmHg  . Diverticulosis   . DVT (deep vein thrombosis) in pregnancy 1954   a. LLE  . Hypertension   . Hypothyroidism    on medication  . Macular degeneration    Dr. [name]  . Osteoarthrosis, unspecified whether generalized or localized, other specified sites   . PAF (paroxysmal atrial fibrillation) (HCC)    Stopped flecainide, on amiodarone but still has bouts of A FIB (mostly in mornings).   . Pure hypercholesterolemia   . Staghorn calculus    Left    Past Surgical History:  Procedure Laterality Date  . APPENDECTOMY  ~ 1941  . AV NODE ABLATION  05/10/2014  . AV NODE ABLATION N/A 05/10/2014   Procedure: AV NODE ABLATION;  Surgeon: Thompson Grayer, MD;  Location: [facility] CATH LAB;  Service: Cardiovascular;  Laterality: N/A;  . BUNIONECTOMY WITH HAMMERTOE RECONSTRUCTION Bilateral ~ 1990  . CARDIAC PACEMAKER PLACEMENT  06/27/99   Medtronic PM implanted by Dr [name]  . CARDIOVERSION N/A 12/02/2013   Procedure: CARDIOVERSION;  Surgeon: [name], MD;  Location: [place];  Service: Cardiovascular;  Laterality: N/A;  . CATARACT EXTRACTION W/ INTRAOCULAR LENS  IMPLANT, BILATERAL Bilateral   . COLONOSCOPY WITH PROPOFOL N/A 04/22/2013  Procedure: COLONOSCOPY WITH PROPOFOL;  Surgeon: [name], MD;  Location: [facility] ENDOSCOPY;  Service: Endoscopy;  Laterality: N/A;  . DILATION AND CURETTAGE OF UTERUS  X 2   "when I was going thru menopause"  . ESOPHAGOGASTRODUODENOSCOPY (EGD) WITH PROPOFOL N/A 04/22/2013   Procedure: ESOPHAGOGASTRODUODENOSCOPY (EGD) WITH PROPOFOL;  Surgeon: [name], MD;  Location: [facility] ENDOSCOPY;  Service: Endoscopy;  Laterality: N/A;  . HERNIA REPAIR    . INCISIONAL HERNIA REPAIR    . INSERT / REPLACE / REMOVE PACEMAKER  06/2007   "took out the old; put in new"  . INSERT / REPLACE / REMOVE PACEMAKER  05/10/2014   MDT PPM generator change by Dr [name]  . JOINT REPLACEMENT    .  PARTIAL NEPHRECTOMY Left 05/1974   stone disease  . PERMANENT PACEMAKER GENERATOR CHANGE N/A 05/10/2014   Procedure: PERMANENT PACEMAKER GENERATOR CHANGE;  Surgeon: Thompson Grayer, MD;  Location: [facility] CATH LAB;  Service: Cardiovascular;  Laterality: N/A;  . TONSILLECTOMY AND ADENOIDECTOMY  1930's  . TOTAL KNEE ARTHROPLASTY Right 2001     Medications: Current Meds  Medication Sig  . amLODipine (NORVASC) 5 MG tablet TAKE 1 TABLET BY MOUTH  DAILY  . atorvastatin (LIPITOR) 40 MG tablet TAKE 1 TABLET BY MOUTH ONCE DAILY FOR CHOLESTEROL  . azelastine (ASTELIN) 0.1 % nasal spray USE 1 TO 2 SPRAYS INTO BOTH NOSTRILS TWICE DAILY.  [name] Biotin 5000 MCG CAPS Take 5,000 mg by mouth daily.   . Cholecalciferol (VITAMIN D) 2000 units CAPS Take 1 capsule by mouth daily.  . fluticasone (FLONASE) 50 MCG/ACT nasal spray USE 2 SPRAYS EACH NOSTRIL ONCE A DAY AS NEEDED FOR ALLERGIES OR CONGESTION.  . furosemide (LASIX) 40 MG tablet TAKE 1 TABLET BY MOUTH  DAILY  . gabapentin (NEURONTIN) 100 MG capsule TAKE 1 CAPSULE BY MOUTH AT  BEDTIME  . latanoprost (XALATAN) 0.005 % ophthalmic solution Place 1 drop into both eyes at bedtime.  [name] levothyroxine (SYNTHROID, LEVOTHROID) 88 MCG tablet TAKE 1 TABLET BY MOUTH  DAILY BEFORE BREAKFAST  . losartan (COZAAR) 100 MG tablet Take 1 tablet (100 mg total) by mouth daily. At bedtime  . losartan (COZAAR) 50 MG tablet TAKE 1 TABLET BY MOUTH 2  TIMES DAILY. TAKE WITH  100MG  TABLET.  . LUTEIN PO Take 25 mg by mouth daily.   . Magnesium 250 MG TABS Take 1 tablet by mouth daily.  . metoprolol succinate (TOPROL-XL) 50 MG 24 hr tablet Take 1 tablet (50 mg total) by mouth every evening. Please make overdue appt with Dr. [name] before anymore refills. 1st attempt  . montelukast (SINGULAIR) 10 MG tablet TAKE 1 TABLET BY MOUTH  DAILY  . Multiple Vitamins-Minerals (PRESERVISION AREDS 2) CAPS Take 1 capsule by mouth daily.  . potassium chloride SA (K-DUR) 20 MEQ tablet TAKE 1 TABLET BY MOUTH TWICE  DAILY.  [name] PROAIR HFA 108 (90 Base) MCG/ACT inhaler USE 2 PUFFS EVERY 6 HOURS AS NEEDED FOR SHORTNESS OF BREATH AND WHEEZING.  . SYMBICORT 160-4.5 MCG/ACT inhaler INHALE 2 PUFFS BY MOUTH  INTO THE LUNGS TWO TIMES  DAILY  . vitamin B-12 (CYANOCOBALAMIN) 1000 MCG tablet Take 1,000 mcg by mouth daily.  Alveda Reasons 15 MG TABS tablet TAKE 1 TABLET BY MOUTH  DAILY WITH SUPPER     Allergies: Allergies  Allergen Reactions  . Brimonidine Tartrate-Timolol Other (See Comments)    REACTION: systemic malaise amigen eye drop  . Penicillins Hives  . Breo Ellipta [Fluticasone Furoate-Vilanterol] Other (See Comments)  Ran blood pressure up Increased blood pressure    Social History: The patient  reports that she has never smoked. She has never used smokeless tobacco. She reports current alcohol use of about 7.0 standard drinks of alcohol per week. She reports that she does not use drugs.   Family History: The patient's family history includes Breast cancer in her mother; Heart attack in her brother; Hypertension in her father; Malignant hypertension in her father; Renal Disease in her father; Stroke in her brother.   Review of Systems: Please see the history of present illness.   All other systems are reviewed and negative.   Physical Exam: VS:  BP (!) 144/90   Pulse 83   Ht 5\' 3"  (1.6 m)   Wt 137 lb (62.1 kg)   LMP 04/08/1974   SpO2 97%   BMI 24.27 kg/m  .  BMI Body mass index is 24.27 kg/m.  Wt Readings from Last 3 Encounters:  01/12/19 137 lb (62.1 kg)  12/24/18 133 lb (60.3 kg)  12/21/18 132 lb (59.9 kg)    General: Pleasant. Elderly female. Looks younger than her stated age. In no acute distress.  HEENT: Normal. She is hard of hearing.  Neck: Supple, no JVD, carotid bruits, or masses noted.  Cardiac: Fairly regular. She has a harsh murmur of AS. Trace edema. She has her support hose in place.  Respiratory:  Lungs are clear to auscultation bilaterally with normal work of breathing.   GI: Soft and nontender.  MS: No deformity or atrophy. Gait and ROM intact.  Skin: Warm and dry. Color is normal.  Neuro:  Strength and sensation are intact and no gross focal deficits noted.  Psych: Alert, appropriate and with normal affect.   LABORATORY DATA:  EKG:  EKG is not ordered today.  Lab Results  Component Value Date   WBC 9.0 03/09/2018   HGB 12.9 03/09/2018   HCT 37.0 03/09/2018   PLT 276 03/09/2018   GLUCOSE 92 03/09/2018   CHOL 138 11/04/2017   TRIG 53 11/04/2017   HDL 70 11/04/2017   LDLCALC 58 11/04/2017   ALT 24 11/04/2017   AST 32 11/04/2017   NA 142 03/09/2018   K 4.0 03/09/2018   CL 100 03/09/2018   CREATININE 1.04 (H) 03/09/2018   BUN 27 03/09/2018   CO2 23 03/09/2018   TSH 1.05 04/30/2016   INR 1.06 06/24/2017   HGBA1C 5.7 (H) 06/25/2017     BNP (last 3 results) No results for input(s): BNP in the last 8760 hours.  ProBNP (last 3 results) No results for input(s): PROBNP in the last 8760 hours.   Other Studies Reviewed Today:  Myoview Study Highlights 12/2018    Nuclear stress EF: 58%.  No T wave inversion was noted during stress.  There was no ST segment deviation noted during stress.  This is a low risk study.   No reversible ischemia. LVEF 58% with normal wall motion. This is a low risk study.   Echo Study Conclusions 06/2017  - Left ventricle: The cavity size was normal. Wall thickness was   increased in a pattern of moderate LVH. Systolic function was   normal. The estimated ejection fraction was in the range of 60%   to 65%. Wall motion was normal; there were no regional wall   motion abnormalities. - Aortic valve: Moderately calcified annulus. Moderately thickened,   moderately calcified leaflets. There was mild to moderate   stenosis. Valve area (VTI): 1.02 cm^2. Valve area (  Vmax): 0.94   cm^2. Valve area (Vmean): 0.9 cm^2. - Mitral valve: There was mild regurgitation. - Left atrium: The atrium was severely dilated.  - Right atrium: The atrium was moderately dilated. - Tricuspid valve: There was moderate regurgitation. - Pulmonary arteries: Systolic pressure was moderately increased.   PA peak pressure: 43 mm Hg (S).    Assessment/Plan:  1. Worsening DOE/known AS - has had reassuring Myoview - will get baseline lab to include BNP - also get her echo updated to make sure we do not have worsening AS. Encouraged her to continue with trying to be active. Further disposition to follow.   2. CHB with underlying PPM - followed by EP  3. Prior AV nodal ablation - per EP  4. HTN - BP is fair - she is on rather high dose of ARB - may need to address - will see what her labs show.   5. Chronic anticoagulation - on the lower dose of Xarelto - no problems noted - getting lab today.   6. Chronic diastolic HF - see above plan.   7. Advanced age - still seems to do pretty well.   8. COVID-19 Education: The signs and symptoms of COVID-19 were discussed with the patient and how to seek care for testing (follow up with PCP or arrange E-visit).  The importance of social distancing, staying at home, hand hygiene and wearing a mask when out in public were discussed today.  Current medicines are reviewed with the patient today.  The patient does not have concerns regarding medicines other than what has been noted above.  The following changes have been made:  See above.  Labs/ tests ordered today include:    Orders Placed This Encounter  Procedures  . Basic metabolic panel  . CBC  . Pro b natriuretic peptide (BNP)  . ECHOCARDIOGRAM COMPLETE     Disposition:   FU with Dr. Marlou Porch per the recall for December. I am happy to see her back as needed.   Patient is agreeable to this plan and will call if any problems develop in the interim.   SignedTruitt Merle, NP  01/12/2019 12:09 PM  [facility] [address] [place], [place]  [address] Phone: [phone] Fax:  [phone]

## 2019-01-12 NOTE — Patient Instructions (Signed)
After Visit Summary:  We will be checking the following labs today - BMET, CBC and BNP   Medication Instructions:    Continue with your current medicines.    If you need a refill on your cardiac medications before your next appointment, please call your pharmacy.     Testing/Procedures To Be Arranged:  Echocardiogram - to follow up aortic stenosis  Follow-Up:   See Dr. Marlou Porch in December    At [facility], you and your health needs are our priority.  As part of our continuing mission to provide you with exceptional heart care, we have created designated Provider Care Teams.  These Care Teams include your primary Cardiologist (physician) and Advanced Practice Providers (APPs -  Physician Assistants and Nurse Practitioners) who all work together to provide you with the care you need, when you need it.  Special Instructions:  . Stay safe, stay home, wash your hands for at least 20 seconds and wear a mask when out in public.  . It was good to talk with you today.    Call the [place] office at [phone] if you have any questions, problems or concerns.

## 2019-01-13 LAB — CBC
Hematocrit: 41.4 % (ref 34.0–46.6)
Hemoglobin: 13.7 g/dL (ref 11.1–15.9)
MCH: 31.9 pg (ref 26.6–33.0)
MCHC: 33.1 g/dL (ref 31.5–35.7)
MCV: 96 fL (ref 79–97)
Platelets: 298 10*3/uL (ref 150–450)
RBC: 4.3 x10E6/uL (ref 3.77–5.28)
RDW: 12.9 % (ref 11.7–15.4)
WBC: 7.7 10*3/uL (ref 3.4–10.8)

## 2019-01-13 LAB — BASIC METABOLIC PANEL
BUN/Creatinine Ratio: 21 (ref 12–28)
BUN: 19 mg/dL (ref 10–36)
CO2: 26 mmol/L (ref 20–29)
Calcium: 9.9 mg/dL (ref 8.7–10.3)
Chloride: 100 mmol/L (ref 96–106)
Creatinine, Ser: 0.9 mg/dL (ref 0.57–1.00)
GFR calc Af Amer: 65 mL/min/{1.73_m2} (ref >59)
GFR calc non Af Amer: 56 mL/min/{1.73_m2} — ABNORMAL LOW (ref >59)
Glucose: 87 mg/dL (ref 65–99)
Potassium: 4.2 mmol/L (ref 3.5–5.2)
Sodium: 142 mmol/L (ref 134–144)

## 2019-01-13 LAB — PRO B NATRIURETIC PEPTIDE: NT-Pro BNP: 1334 pg/mL — ABNORMAL HIGH (ref 0–738)

## 2019-01-15 ENCOUNTER — Ambulatory Visit (HOSPITAL_COMMUNITY): Payer: Medicare Other | Attending: Cardiology

## 2019-01-15 ENCOUNTER — Other Ambulatory Visit: Payer: Self-pay

## 2019-01-15 DIAGNOSIS — R0602 Shortness of breath: Secondary | ICD-10-CM | POA: Diagnosis not present

## 2019-01-15 DIAGNOSIS — I350 Nonrheumatic aortic (valve) stenosis: Secondary | ICD-10-CM | POA: Insufficient documentation

## 2019-01-21 DIAGNOSIS — Z23 Encounter for immunization: Secondary | ICD-10-CM | POA: Diagnosis not present

## 2019-02-05 ENCOUNTER — Encounter: Payer: Self-pay | Admitting: Family

## 2019-02-05 ENCOUNTER — Ambulatory Visit (INDEPENDENT_AMBULATORY_CARE_PROVIDER_SITE_OTHER): Payer: Medicare Other | Admitting: Family

## 2019-02-05 ENCOUNTER — Other Ambulatory Visit: Payer: Self-pay

## 2019-02-05 DIAGNOSIS — Z0000 Encounter for general adult medical examination without abnormal findings: Secondary | ICD-10-CM

## 2019-02-05 NOTE — Progress Notes (Signed)
This service is provided via telemedicine  No vital signs collected/recorded due to the encounter was a telemedicine visit.   Location of patient (ex: home, work):  Home   Patient consents to a telephone visit:  yes  Location of the provider (ex: office, home):  Office   Name of any referring provider:  Hollace Kinnier , D.O  Names of all persons participating in the telemedicine service and their role in the encounter:  Marlowe Sax, NP, Ruthell Rummage CMA, and Bea Graff   Time spent on call:  Ruthell Rummage CMA spent 14 minutes on phone with patient.

## 2019-02-05 NOTE — Progress Notes (Signed)
Subjective:   Jody Blake is a 85 y.o. female who presents for Medicare Annual (Subsequent) preventive examination.  Review of Systems:  Cardiac Risk Factors include: advanced age (>71men, >2 women);hypertension;family history of premature cardiovascular disease;dyslipidemia     Objective:     Vitals: LMP 04/08/1974   There is no height or weight on file to calculate BMI.  Advanced Directives 11/12/2017 07/09/2017 06/25/2017 05/27/2017 03/05/2017 11/20/2016 11/14/2016  Does Patient Have a Medical Advance Directive? Yes Yes No Yes Yes - -  Type of Paramedic of [place];Out of facility DNR (pink MOST or yellow form) [place];Living will;Out of facility DNR (pink MOST or yellow form) - [place];Out of facility DNR (pink MOST or yellow form) [place];Out of facility DNR (pink MOST or yellow form) Out of facility DNR (pink MOST or yellow form);Healthcare Power of [place];Living will  Does patient want to make changes to medical advance directive? No - Patient declined No - Patient declined - No - Patient declined No - Patient declined - -  Copy of [place] in Chart? Yes Yes - Yes Yes Yes No - copy requested  Would patient like information on creating a medical advance directive? - No - Patient declined No - Patient declined - - - -  Pre-existing out of facility DNR order (yellow form or pink MOST form) Yellow form placed in chart (order not valid for inpatient use) Yellow form placed in chart (order not valid for inpatient use) - Yellow form placed in chart (order not valid for inpatient use) Yellow form placed in chart (order not valid for inpatient use) Yellow form placed in chart (order not valid for inpatient use) -    Tobacco Social History   Tobacco Use  Smoking Status Never Smoker  Smokeless Tobacco Never Used     Counseling given: Not Answered    Clinical Intake:  Pre-visit preparation completed: No  Pain : No/denies pain     BMI - recorded: 23.56 Nutritional Status: BMI of 19-24  Normal Nutritional Risks: None Diabetes: No  How often do you need to have someone help you when you read instructions, pamphlets, or other written materials from your doctor or pharmacy?: 3 - Sometimes What is the last grade level you completed in school?: 3 Yrs of college  Interpreter Needed?: No  Information entered by :: Haleemah Buckalew FNP-C  Past Medical History:  Diagnosis Date  . Anemia   . Aortic stenosis    a. Echo 09/06/12 EF 55-60%, no WMA, G2DD, Ao valve sclerosis w/ mod stenosis, LA mildly dilated, PA pressure 33mmHg  . Asthmatic bronchitis   . Complete heart block [facility])    s/p permanent pacemaker 06/27/1999 (Battery change 06/2007 and 2016).  s/p AV nodal ablation by Dr [name] 2016.  [name] COPD (chronic obstructive pulmonary disease) (HCC)    Dr. [name]  . DDD (degenerative disc disease)   . Diastolic dysfunction    a. Echo 09/06/12 EF 55-60%, no WMA, G2DD, Ao valve sclerosis w/ mod stenosis, LA mildly dilated, PA pressure 38mmHg  . Diverticulosis   . DVT (deep vein thrombosis) in pregnancy 1954   a. LLE  . Hypertension   . Hypothyroidism    on medication  . Macular degeneration    Dr. [name]  . Osteoarthrosis, unspecified whether generalized or localized, other specified sites   . PAF (paroxysmal atrial fibrillation) (HCC)    Stopped flecainide,  on amiodarone but still has bouts of A FIB (mostly in mornings).   . Pure hypercholesterolemia   . Staghorn calculus    Left   Past Surgical History:  Procedure Laterality Date  . APPENDECTOMY  ~ 1941  . AV NODE ABLATION  05/10/2014  . AV NODE ABLATION N/A 05/10/2014   Procedure: AV NODE ABLATION;  Surgeon: Thompson Grayer, MD;  Location: [facility] CATH LAB;  Service: Cardiovascular;  Laterality: N/A;  . BUNIONECTOMY WITH HAMMERTOE RECONSTRUCTION Bilateral ~ 1990  . CARDIAC PACEMAKER PLACEMENT   06/27/99   Medtronic PM implanted by Dr [name]  . CARDIOVERSION N/A 12/02/2013   Procedure: CARDIOVERSION;  Surgeon: [name], MD;  Location: [facility] ENDOSCOPY;  Service: Cardiovascular;  Laterality: N/A;  . CATARACT EXTRACTION W/ INTRAOCULAR LENS  IMPLANT, BILATERAL Bilateral   . COLONOSCOPY WITH PROPOFOL N/A 04/22/2013   Procedure: COLONOSCOPY WITH PROPOFOL;  Surgeon: [name], MD;  Location: [facility] ENDOSCOPY;  Service: Endoscopy;  Laterality: N/A;  . DILATION AND CURETTAGE OF UTERUS  X 2   "when I was going thru menopause"  . ESOPHAGOGASTRODUODENOSCOPY (EGD) WITH PROPOFOL N/A 04/22/2013   Procedure: ESOPHAGOGASTRODUODENOSCOPY (EGD) WITH PROPOFOL;  Surgeon: [name], MD;  Location: [facility] ENDOSCOPY;  Service: Endoscopy;  Laterality: N/A;  . HERNIA REPAIR    . INCISIONAL HERNIA REPAIR    . INSERT / REPLACE / REMOVE PACEMAKER  06/2007   "took out the old; put in new"  . INSERT / REPLACE / REMOVE PACEMAKER  05/10/2014   MDT PPM generator change by Dr [name]  . JOINT REPLACEMENT    . PARTIAL NEPHRECTOMY Left 05/1974   stone disease  . PERMANENT PACEMAKER GENERATOR CHANGE N/A 05/10/2014   Procedure: PERMANENT PACEMAKER GENERATOR CHANGE;  Surgeon: Thompson Grayer, MD;  Location: [facility] CATH LAB;  Service: Cardiovascular;  Laterality: N/A;  . TONSILLECTOMY AND ADENOIDECTOMY  1930's  . TOTAL KNEE ARTHROPLASTY Right 2001   Family History  Problem Relation Age of Onset  . Malignant hypertension Father   . Hypertension Father   . Renal Disease Father   . Breast cancer Mother   . Heart attack Brother   . Stroke Brother    Social History   Socioeconomic History  . Marital status: Married    Spouse name: Not on file  . Number of children: 2  . Years of education: Not on file  . Highest education level: Not on file  Occupational History  . Occupation: RETIRED    Employer: RETIRED    Comment: Family [place]  . Financial resource strain: Not hard at all  . Food insecurity     Worry: Never true    Inability: Never true  . Transportation needs    Medical: No    Non-medical: No  Tobacco Use  . Smoking status: Never Smoker  . Smokeless tobacco: Never Used  Substance and Sexual Activity  . Alcohol use: Yes    Alcohol/week: 7.0 standard drinks    Types: 7 Glasses of wine per week    Comment: occasionally  . Drug use: No  . Sexual activity: Not Currently    Partners: Male  Lifestyle  . Physical activity    Days per week: 7 days    Minutes per session: 30 min  . Stress: Not at all  Relationships  . Social connections    Talks on phone: More than three times a week    Gets together: More than three times a week  Attends religious service: More than 4 times per year    Active member of club or organization: Yes    Attends meetings of clubs or organizations: More than 4 times per year    Relationship status: Married  Other Topics Concern  . Not on file  Social History Narrative   Diet? Low salt, low fat      Do you drink/eat things with caffeine? yes      Marital status?                 married                   What year were you married? 1949      Do you live in a house, apartment, assisted living, condo, trailer, etc.? apartment      Is it one or more stories? 3 stories      How many persons live in your home? Just me      Do you have any pets in your home? (please list) no      Current or past profession: accounting      Do you exercise?          yes                            Type & how often? Walk, class, prescribed daily      Do you have a living will? yes      Do you have a DNR form?     yes                             If not, do you want to discuss one?      Do you have signed POA/HPOA for forms? no          Outpatient Encounter Medications as of 02/05/2019  Medication Sig  . amLODipine (NORVASC) 5 MG tablet TAKE 1 TABLET BY MOUTH  DAILY  . atorvastatin (LIPITOR) 40 MG tablet TAKE 1 TABLET BY MOUTH ONCE DAILY FOR  CHOLESTEROL  . azelastine (ASTELIN) 0.1 % nasal spray USE 1 TO 2 SPRAYS INTO BOTH NOSTRILS TWICE DAILY.  [name] Biotin 5000 MCG CAPS Take 5,000 mg by mouth daily.   . Cholecalciferol (VITAMIN D) 2000 units CAPS Take 1 capsule by mouth daily.  . fluticasone (FLONASE) 50 MCG/ACT nasal spray USE 2 SPRAYS EACH NOSTRIL ONCE A DAY AS NEEDED FOR ALLERGIES OR CONGESTION.  . furosemide (LASIX) 40 MG tablet TAKE 1 TABLET BY MOUTH  DAILY  . gabapentin (NEURONTIN) 100 MG capsule TAKE 1 CAPSULE BY MOUTH AT  BEDTIME  . latanoprost (XALATAN) 0.005 % ophthalmic solution Place 1 drop into both eyes at bedtime.  [name] levothyroxine (SYNTHROID, LEVOTHROID) 88 MCG tablet TAKE 1 TABLET BY MOUTH  DAILY BEFORE BREAKFAST  . losartan (COZAAR) 100 MG tablet Take 1 tablet (100 mg total) by mouth daily. At bedtime  . losartan (COZAAR) 50 MG tablet TAKE 1 TABLET BY MOUTH 2  TIMES DAILY. TAKE WITH  100MG  TABLET.  . LUTEIN PO Take 25 mg by mouth daily.   . Magnesium 250 MG TABS Take 1 tablet by mouth daily.  . metoprolol succinate (TOPROL-XL) 50 MG 24 hr tablet Take 1 tablet (50 mg total) by mouth every evening. Please make overdue appt with Dr. [name] before anymore refills. 1st attempt  . montelukast (SINGULAIR) 10 MG tablet  TAKE 1 TABLET BY MOUTH  DAILY  . Multiple Vitamins-Minerals (PRESERVISION AREDS 2) CAPS Take 1 capsule by mouth daily.  . potassium chloride SA (K-DUR) 20 MEQ tablet TAKE 1 TABLET BY MOUTH TWICE DAILY.  [name] PROAIR HFA 108 (90 Base) MCG/ACT inhaler USE 2 PUFFS EVERY 6 HOURS AS NEEDED FOR SHORTNESS OF BREATH AND WHEEZING.  . SYMBICORT 160-4.5 MCG/ACT inhaler INHALE 2 PUFFS BY MOUTH  INTO THE LUNGS TWO TIMES  DAILY  . vitamin B-12 (CYANOCOBALAMIN) 1000 MCG tablet Take 1,000 mcg by mouth daily.  Alveda Reasons 15 MG TABS tablet TAKE 1 TABLET BY MOUTH  DAILY WITH SUPPER   No facility-administered encounter medications on file as of 02/05/2019.     Activities of Daily Living In your present state of health, do you have  any difficulty performing the following activities: 02/05/2019  Hearing? Y  Comment wears hearing aids  Vision? Y  Comment see opthalmology  Difficulty concentrating or making decisions? N  Walking or climbing stairs? N  Dressing or bathing? N  Doing errands, shopping? Y  Comment daughter [place] and eating ? [place] provides meals  Using the Toilet? N  In the past six months, have you accidently leaked urine? Y  Comment coughing  Do you have problems with loss of bowel control? N  Managing your Medications? N  Managing your Finances? N  Some recent data might be hidden    Patient Care Team: Gayland Curry, DO as PCP - General (Geriatric Medicine) Jerline Pain, MD as PCP - Cardiology (Cardiology) Thompson Grayer, MD as Consulting Physician (Cardiology) Deneise Lever, MD as Consulting Physician (Pulmonary Disease) Rutherford Guys, MD as Consulting Physician (Ophthalmology) [name], MD as Consulting Physician (Gastroenterology) Gaynelle Arabian, MD as Consulting Physician (Orthopedic Surgery)    Assessment:   This is a routine wellness examination for [place].  Exercise Activities and Dietary recommendations Current Exercise Habits: Home exercise routine, Type of exercise: walking;stretching, Time (Minutes): 30, Frequency (Times/Week): 3, Weekly Exercise (Minutes/Week): 90, Intensity: Moderate, Exercise limited by: None identified  Goals   None     Fall Risk Fall Risk  02/05/2019 11/25/2018 07/29/2018 03/18/2018 11/12/2017  Falls in the past year? 0 0 0 0 No  Number falls in past yr: 0 0 0 0 -  Injury with Fall? 0 0 0 0 -   Is the patient's home free of loose throw rugs in walkways, pet beds, electrical cords, etc?   no      Grab bars in the bathroom? yes      Handrails on the stairs?   no stairs      Adequate lighting?   no  Depression Screen PHQ 2/9 Scores 02/05/2019 11/25/2018 07/29/2018 03/18/2018  PHQ - 2 Score 0 0 0 0      Cognitive Function MMSE - Mini Mental State Exam 05/27/2017 03/26/2016  Orientation to time 5 5  Orientation to Place 5 5  Registration 3 3  Attention/ Calculation 5 5  Recall 3 3  Language- name 2 objects 2 2  Language- repeat 1 1  Language- follow 3 step command 3 3  Language- read & follow direction 1 1  Write a sentence 1 1  Copy design 0 1  Total score 29 30     6CIT Screen 02/05/2019  What Year? 0 points  What month? 0 points  What time? 0 points  Count back from 20 0 points  Months in reverse 0  points  Repeat phrase 0 points  Total Score 0    Immunization History  Administered Date(s) Administered  . Influenza Split 01/07/2011  . Influenza Whole 01/16/2009, 01/23/2010  . Influenza, High Dose Seasonal PF 01/06/2013, 01/15/2019  . Influenza,inj,Quad PF,6+ Mos 02/17/2015, 01/30/2018  . Influenza,inj,quad, With Preservative 01/06/2017  . Influenza-Unspecified 01/06/2014, 02/01/2016  . Pneumococcal-Unspecified 11/23/2013  . Zoster Recombinat (Shingrix) 03/13/2017, 05/20/2017    Qualifies for Shingles Vaccine? Up to date   Screening Tests Health Maintenance  Topic Date Due  . TETANUS/TDAP  02/15/1945  . DEXA SCAN  02/14/1993  . PNA vac Low Risk Adult (2 of 2 - PCV13) 11/24/2014  . INFLUENZA VACCINE  Completed    Cancer Screenings: Lung: Low Dose CT Chest recommended if Age 33-80 years, 30 pack-year currently smoking OR have quit w/in 15years. Patient does not qualify. Breast:  Up to date on Mammogram? Yes   Up to date of Bone Density/Dexa? Ordered but has not had it done due to COVID-19 restrictions Colorectal:Aged out   Additional Screenings: Hepatitis C Screening: Low Risk   Plan:  - Has order for Bone Density but would like to wait until COVID-19 restrictions are over.  - Tdap vaccine Post poned  - PNA Vac Post poned   I have personally reviewed and noted the following in the patient's chart:   . Medical and social history . Use of alcohol,  tobacco or illicit drugs  . Current medications and supplements . Functional ability and status . Nutritional status . Physical activity . Advanced directives . List of other physicians . Hospitalizations, surgeries, and ER visits in previous 12 months . Vitals . Screenings to include cognitive, depression, and falls . Referrals and appointments  In addition, I have reviewed and discussed with patient certain preventive protocols, quality metrics, and best practice recommendations. A written personalized care plan for preventive services as well as general preventive health recommendations were provided to patient.   Sandrea Hughs, NP  02/05/2019

## 2019-02-05 NOTE — Patient Instructions (Signed)
Jody Blake , Thank you for taking time to come for your Medicare Wellness Visit. I appreciate your ongoing commitment to your health goals. Please review the following plan we discussed and let me know if I can assist you in the future.   Screening recommendations/referrals: Colonoscopy: Aged out  Mammogram: Up to date  Bone Density: Order in place  Recommended yearly ophthalmology/optometry visit for glaucoma screening and checkup Recommended yearly dental visit for hygiene and checkup  Vaccinations: Influenza vaccine: Up to date  Pneumococcal vaccine: Due  Tdap vaccine: Due  Shingles vaccine : Up to date    Advanced directives:Yes   Conditions/risks identified: Advance age > 83 yrs,Hypertension,family Hx of CVD,dyslipidemia   Next appointment: 1 yr    Preventive Care 83 Years and Older, Female Preventive care refers to lifestyle choices and visits with your health care provider that can promote health and wellness. What does preventive care include?  A yearly physical exam. This is also called an annual well check.  Dental exams once or twice a year.  Routine eye exams. Ask your health care provider how often you should have your eyes checked.  Personal lifestyle choices, including:  Daily care of your teeth and gums.  Regular physical activity.  Eating a healthy diet.  Avoiding tobacco and drug use.  Limiting alcohol use.  Practicing safe sex.  Taking low-dose aspirin every day.  Taking vitamin and mineral supplements as recommended by your health care provider. What happens during an annual well check? The services and screenings done by your health care provider during your annual well check will depend on your age, overall health, lifestyle risk factors, and family history of disease. Counseling  Your health care provider may ask you questions about your:  Alcohol use.  Tobacco use.  Drug use.  Emotional well-being.  Home and relationship  well-being.  Sexual activity.  Eating habits.  History of falls.  Memory and ability to understand (cognition).  Work and work Statistician.  Reproductive health. Screening  You may have the following tests or measurements:  Height, weight, and BMI.  Blood pressure.  Lipid and cholesterol levels. These may be checked every 5 years, or more frequently if you are over 45 years old.  Skin check.  Lung cancer screening. You may have this screening every year starting at age 63 if you have a 30-pack-year history of smoking and currently smoke or have quit within the past 15 years.  Fecal occult blood test (FOBT) of the stool. You may have this test every year starting at age 51.  Flexible sigmoidoscopy or colonoscopy. You may have a sigmoidoscopy every 5 years or a colonoscopy every 10 years starting at age 84.  Hepatitis C blood test.  Hepatitis B blood test.  Sexually transmitted disease (STD) testing.  Diabetes screening. This is done by checking your blood sugar (glucose) after you have not eaten for a while (fasting). You may have this done every 1-3 years.  Bone density scan. This is done to screen for osteoporosis. You may have this done starting at age 83.  Mammogram. This may be done every 1-2 years. Talk to your health care provider about how often you should have regular mammograms. Talk with your health care provider about your test results, treatment options, and if necessary, the need for more tests. Vaccines  Your health care provider may recommend certain vaccines, such as:  Influenza vaccine. This is recommended every year.  Tetanus, diphtheria, and acellular pertussis (Tdap, Td) vaccine.  You may need a Td booster every 10 years.  Zoster vaccine. You may need this after age 83.  Pneumococcal 13-valent conjugate (PCV13) vaccine. One dose is recommended after age 9.  Pneumococcal polysaccharide (PPSV23) vaccine. One dose is recommended after age 37.  Talk to your health care provider about which screenings and vaccines you need and how often you need them. This information is not intended to replace advice given to you by your health care provider. Make sure you discuss any questions you have with your health care provider. Document Released: 04/21/2015 Document Revised: 12/13/2015 Document Reviewed: 01/24/2015 Elsevier Interactive Patient Education  2017 [place] Prevention in the Home Falls can cause injuries. They can happen to people of all ages. There are many things you can do to make your home safe and to help prevent falls. What can I do on the outside of my home?  Regularly fix the edges of walkways and driveways and fix any cracks.  Remove anything that might make you trip as you walk through a door, such as a raised step or threshold.  Trim any bushes or trees on the path to your home.  Use bright outdoor lighting.  Clear any walking paths of anything that might make someone trip, such as rocks or tools.  Regularly check to see if handrails are loose or broken. Make sure that both sides of any steps have handrails.  Any raised decks and porches should have guardrails on the edges.  Have any leaves, snow, or ice cleared regularly.  Use sand or salt on walking paths during winter.  Clean up any spills in your garage right away. This includes oil or grease spills. What can I do in the bathroom?  Use night lights.  Install grab bars by the toilet and in the tub and shower. Do not use towel bars as grab bars.  Use non-skid mats or decals in the tub or shower.  If you need to sit down in the shower, use a plastic, non-slip stool.  Keep the floor dry. Clean up any water that spills on the floor as soon as it happens.  Remove soap buildup in the tub or shower regularly.  Attach bath mats securely with double-sided non-slip rug tape.  Do not have throw rugs and other things on the floor that can make you  trip. What can I do in the bedroom?  Use night lights.  Make sure that you have a light by your bed that is easy to reach.  Do not use any sheets or blankets that are too big for your bed. They should not hang down onto the floor.  Have a firm chair that has side arms. You can use this for support while you get dressed.  Do not have throw rugs and other things on the floor that can make you trip. What can I do in the kitchen?  Clean up any spills right away.  Avoid walking on wet floors.  Keep items that you use a lot in easy-to-reach places.  If you need to reach something above you, use a strong step stool that has a grab bar.  Keep electrical cords out of the way.  Do not use floor polish or wax that makes floors slippery. If you must use wax, use non-skid floor wax.  Do not have throw rugs and other things on the floor that can make you trip. What can I do with my stairs?  Do not leave any  items on the stairs.  Make sure that there are handrails on both sides of the stairs and use them. Fix handrails that are broken or loose. Make sure that handrails are as long as the stairways.  Check any carpeting to make sure that it is firmly attached to the stairs. Fix any carpet that is loose or worn.  Avoid having throw rugs at the top or bottom of the stairs. If you do have throw rugs, attach them to the floor with carpet tape.  Make sure that you have a light switch at the top of the stairs and the bottom of the stairs. If you do not have them, ask someone to add them for you. What else can I do to help prevent falls?  Wear shoes that:  Do not have high heels.  Have rubber bottoms.  Are comfortable and fit you well.  Are closed at the toe. Do not wear sandals.  If you use a stepladder:  Make sure that it is fully opened. Do not climb a closed stepladder.  Make sure that both sides of the stepladder are locked into place.  Ask someone to hold it for you, if  possible.  Clearly mark and make sure that you can see:  Any grab bars or handrails.  First and last steps.  Where the edge of each step is.  Use tools that help you move around (mobility aids) if they are needed. These include:  Canes.  Walkers.  Scooters.  Crutches.  Turn on the lights when you go into a dark area. Replace any light bulbs as soon as they burn out.  Set up your furniture so you have a clear path. Avoid moving your furniture around.  If any of your floors are uneven, fix them.  If there are any pets around you, be aware of where they are.  Review your medicines with your doctor. Some medicines can make you feel dizzy. This can increase your chance of falling. Ask your doctor what other things that you can do to help prevent falls. This information is not intended to replace advice given to you by your health care provider. Make sure you discuss any questions you have with your health care provider. Document Released: 01/19/2009 Document Revised: 08/31/2015 Document Reviewed: 04/29/2014 Elsevier Interactive Patient Education  2017 [employer].

## 2019-02-07 ENCOUNTER — Other Ambulatory Visit: Payer: Self-pay | Admitting: Internal Medicine

## 2019-02-15 ENCOUNTER — Other Ambulatory Visit: Payer: Self-pay | Admitting: Internal Medicine

## 2019-02-24 ENCOUNTER — Other Ambulatory Visit: Payer: Self-pay

## 2019-02-24 ENCOUNTER — Encounter: Payer: Self-pay | Admitting: Internal Medicine

## 2019-02-24 ENCOUNTER — Non-Acute Institutional Stay: Payer: Medicare Other | Admitting: Internal Medicine

## 2019-02-24 VITALS — BP 118/76 | HR 72 | Temp 97.3°F | Ht 63.0 in | Wt 137.0 lb

## 2019-02-24 DIAGNOSIS — K432 Incisional hernia without obstruction or gangrene: Secondary | ICD-10-CM | POA: Diagnosis not present

## 2019-02-24 DIAGNOSIS — I825Z3 Chronic embolism and thrombosis of unspecified deep veins of distal lower extremity, bilateral: Secondary | ICD-10-CM

## 2019-02-24 DIAGNOSIS — J449 Chronic obstructive pulmonary disease, unspecified: Secondary | ICD-10-CM

## 2019-02-24 DIAGNOSIS — Z23 Encounter for immunization: Secondary | ICD-10-CM | POA: Diagnosis not present

## 2019-02-24 DIAGNOSIS — I5032 Chronic diastolic (congestive) heart failure: Secondary | ICD-10-CM | POA: Diagnosis not present

## 2019-02-24 DIAGNOSIS — K5901 Slow transit constipation: Secondary | ICD-10-CM | POA: Diagnosis not present

## 2019-02-24 DIAGNOSIS — F4321 Adjustment disorder with depressed mood: Secondary | ICD-10-CM

## 2019-02-24 MED ORDER — TETANUS-DIPHTH-ACELL PERTUSSIS 5-2.5-18.5 LF-MCG/0.5 IM SUSP: 0.5000 mL | Freq: Once | INTRAMUSCULAR | 0 refills | Status: AC

## 2019-02-24 NOTE — Progress Notes (Signed)
Location:  Occupational psychologist of Service:  Clinic (12)  Provider: Trinton Prewitt L. Mariea Clonts, D.O., C.M.D.  Code Status: DNR Goals of Care:  Advanced Directives 02/24/2019  Does Patient Have a Medical Advance Directive? Yes  Type of Paramedic of [place];Out of facility DNR (pink MOST or yellow form)  Does patient want to make changes to medical advance directive? No - Patient declined  Copy of [place] in Chart? Yes - validated most recent copy scanned in chart (See row information)  Would patient like information on creating a medical advance directive? -  Pre-existing out of facility DNR order (yellow form or pink MOST form) Yellow form placed in chart (order not valid for inpatient use)     Chief Complaint  Patient presents with  . Medical Management of Chronic Issues    3 month follow-up   . Immunizations    Discuss need for TD/TDaP     HPI: Patient is a 85 y.o. female seen today for medical management of chronic diseases.    Since we met last, her husband passed away in skilled care after a prolonged time there with end stage dementia.  "Things are alright".  She thought she was prepared.  She has a lot of family and friends supporting her.  The family got to see him before he passed.  She does struggle to get to sleep, but is ok once she's asleep.  Her appetite has not been great and she's not been exercising like usual, but is gradually getting herself back on track.  [facility] [address] [facility]. She agrees to take her prevnar13 today.  It appears she had pneumovax since age 81.   She misses going to the dining room.    Currently, she is having a little constipation which she's not had before.  She had kidney surgery in '76 and she had an incisional hernia.  She was told never to let herself get constipated after that.  It's bulging there a little.  She's doubled up her magnesium to 533m off and on  lately.  That has helped her bowels to move better.  She's had hard and infrequent stools.  Only one day b/w, but then very difficult.  She has a big glass of water when she gets up, another mid morning and midafternoon and with meds at night.  She also has tea with lunch, wine at supper.  She has backslid on walking.  She did yesterday and she did the exercise class today.  Had been w/o doing it for a month.  She does eat a lot of vegetables.  She will split her lunch meal so really has just one veggie per day.   She has an orange almost every morning for breakfast.  She's eaten less lately.    CHF:  She has a little more fluid in her ankles.  Does choose low sodium soups.    She sees Dr. YAnnamaria BootsMonday about her COPD and she thinks he is going to put her on prednisone.  She cannot take the other newer inhalers b/c they will affect her glaucoma.  She does not want to lose more vision.  Past Medical History:  Diagnosis Date  . Anemia   . Aortic stenosis    a. Echo 09/06/12 EF 55-60%, no WMA, G2DD, Ao valve sclerosis w/ mod stenosis, LA mildly dilated, PA pressure 436mg  . Asthmatic bronchitis   . Complete heart  block [facility])    s/p permanent pacemaker 06/27/1999 (Battery change 06/2007 and 2016).  s/p AV nodal ablation by Dr [name] 2016.  [name] COPD (chronic obstructive pulmonary disease) (HCC)    Dr. [name]  . DDD (degenerative disc disease)   . Diastolic dysfunction    a. Echo 09/06/12 EF 55-60%, no WMA, G2DD, Ao valve sclerosis w/ mod stenosis, LA mildly dilated, PA pressure 71mHg  . Diverticulosis   . DVT (deep vein thrombosis) in pregnancy 1954   a. LLE  . Hypertension   . Hypothyroidism    on medication  . Macular degeneration    Dr. KEliezer Bottom . Osteoarthrosis, unspecified whether generalized or localized, other specified sites   . PAF (paroxysmal atrial fibrillation) (HCC)    Stopped flecainide, on amiodarone but still has bouts of A FIB (mostly in mornings).   . Pure hypercholesterolemia   .  Staghorn calculus    Left    Past Surgical History:  Procedure Laterality Date  . APPENDECTOMY  ~ 1941  . AV NODE ABLATION  05/10/2014  . AV NODE ABLATION N/A 05/10/2014   Procedure: AV NODE ABLATION;  Surgeon: JThompson Grayer MD;  [facility];  Service: Cardiovascular;  Laterality: N/A;  . BUNIONECTOMY WITH HAMMERTOE RECONSTRUCTION Bilateral ~ 1990  . CARDIAC PACEMAKER PLACEMENT  06/27/99   Medtronic PM implanted by Dr ELeonia Reeves . CARDIOVERSION N/A 12/02/2013   Procedure: CARDIOVERSION;  Surgeon: PFay Records MD;  [facility];  Service: Cardiovascular;  Laterality: N/A;  . CATARACT EXTRACTION W/ INTRAOCULAR LENS  IMPLANT, BILATERAL Bilateral   . COLONOSCOPY WITH PROPOFOL N/A 04/22/2013   Procedure: COLONOSCOPY WITH PROPOFOL;  Surgeon: MGarlan Fair MD;  Location: [facility] ENDOSCOPY;  Service: Endoscopy;  Laterality: N/A;  . DILATION AND CURETTAGE OF UTERUS  X 2   "when I was going thru menopause"  . ESOPHAGOGASTRODUODENOSCOPY (EGD) WITH PROPOFOL N/A 04/22/2013   Procedure: ESOPHAGOGASTRODUODENOSCOPY (EGD) WITH PROPOFOL;  Surgeon: MGarlan Fair MD;  Location: [facility] ENDOSCOPY;  Service: Endoscopy;  Laterality: N/A;  . HERNIA REPAIR    . INCISIONAL HERNIA REPAIR    . INSERT / REPLACE / REMOVE PACEMAKER  06/2007   "took out the old; put in new"  . INSERT / REPLACE / REMOVE PACEMAKER  05/10/2014   MDT PPM generator change by Dr ARayann Heman . JOINT REPLACEMENT    . PARTIAL NEPHRECTOMY Left 05/1974   stone disease  . PERMANENT PACEMAKER GENERATOR CHANGE N/A 05/10/2014   Procedure: PERMANENT PACEMAKER GENERATOR CHANGE;  Surgeon: JThompson Grayer MD;  [facility];  Service: Cardiovascular;  Laterality: N/A;  . TONSILLECTOMY AND ADENOIDECTOMY  1930's  . TOTAL KNEE ARTHROPLASTY Right 2001    Allergies  Allergen Reactions  . Brimonidine Tartrate-Timolol Other (See Comments)    REACTION: systemic malaise amigen eye drop  . Penicillins Hives  . Breo Ellipta [Fluticasone  Furoate-Vilanterol] Other (See Comments)    Ran blood pressure up Increased blood pressure    Outpatient Encounter Medications as of 02/24/2019  Medication Sig  . amLODipine (NORVASC) 5 MG tablet TAKE 1 TABLET BY MOUTH  DAILY  . atorvastatin (LIPITOR) 40 MG tablet TAKE 1 TABLET BY MOUTH ONCE DAILY FOR CHOLESTEROL  . azelastine (ASTELIN) 0.1 % nasal spray USE 1 TO 2 SPRAYS INTO BOTH NOSTRILS TWICE DAILY.  .Marland KitchenBiotin 5000 MCG CAPS Take 5,000 mg by mouth daily.   . Cholecalciferol (VITAMIN D) 2000 units CAPS Take 1 capsule by mouth daily.  . fluticasone (FLONASE)  50 MCG/ACT nasal spray USE 2 SPRAYS EACH NOSTRIL ONCE A DAY AS NEEDED FOR ALLERGIES OR CONGESTION.  . furosemide (LASIX) 40 MG tablet TAKE 1 TABLET BY MOUTH  DAILY  . gabapentin (NEURONTIN) 100 MG capsule TAKE 1 CAPSULE BY MOUTH AT  BEDTIME  . latanoprost (XALATAN) 0.005 % ophthalmic solution Place 1 drop into both eyes at bedtime.  [name] levothyroxine (SYNTHROID, LEVOTHROID) 88 MCG tablet TAKE 1 TABLET BY MOUTH  DAILY BEFORE BREAKFAST  . losartan (COZAAR) 100 MG tablet Take 1 tablet (100 mg total) by mouth daily. At bedtime  . losartan (COZAAR) 50 MG tablet TAKE 1 TABLET BY MOUTH 2  TIMES DAILY. TAKE WITH  100MG TABLET.  . LUTEIN PO Take 25 mg by mouth daily.   . Magnesium 250 MG TABS Take 1 tablet by mouth daily.  . metoprolol succinate (TOPROL-XL) 50 MG 24 hr tablet TAKE 1 TABLET BY MOUTH IN  THE EVENING  . montelukast (SINGULAIR) 10 MG tablet TAKE 1 TABLET BY MOUTH  DAILY  . Multiple Vitamins-Minerals (PRESERVISION AREDS 2) CAPS Take 1 capsule by mouth daily.  . potassium chloride SA (K-DUR) 20 MEQ tablet TAKE 1 TABLET BY MOUTH TWICE DAILY.  [name] PROAIR HFA 108 (90 Base) MCG/ACT inhaler USE 2 PUFFS EVERY 6 HOURS AS NEEDED FOR SHORTNESS OF BREATH AND WHEEZING.  . SYMBICORT 160-4.5 MCG/ACT inhaler INHALE 2 PUFFS BY MOUTH  INTO THE LUNGS TWO TIMES  DAILY  . vitamin B-12 (CYANOCOBALAMIN) 1000 MCG tablet Take 1,000 mcg by mouth daily.  Alveda Reasons 15 MG TABS tablet TAKE 1 TABLET BY MOUTH  DAILY WITH SUPPER   No facility-administered encounter medications on file as of 02/24/2019.     Review of Systems:  Review of Systems  Constitutional: Negative for chills, fever and weight loss.  HENT: Positive for hearing loss. Negative for congestion.        HOH despite hearing aids  Eyes: Positive for blurred vision.       Glasses, vision poor with macular and glaucoma  Respiratory: Positive for shortness of breath. Negative for cough, sputum production and wheezing.   Cardiovascular: Positive for leg swelling. Negative for chest pain, orthopnea and PND.  Gastrointestinal: Positive for constipation. Negative for abdominal pain, blood in stool, diarrhea and melena.       Left flank/side incisional hernia  Genitourinary: Negative for dysuria.  Musculoskeletal: Negative for falls and joint pain.  Skin: Negative for rash.  Neurological: Negative for dizziness and loss of consciousness.  Endo/Heme/Allergies: Bruises/bleeds easily.  Psychiatric/Behavioral: Negative for depression and memory loss. The patient has insomnia. The patient is not nervous/anxious.        Grieving loss of her husband    Health Maintenance  Topic Date Due  . TETANUS/TDAP  02/15/1947  . DEXA SCAN  02/14/1993  . PNA vac Low Risk Adult (2 of 2 - PCV13) 02/24/2019 (Originally 11/24/2014)  . INFLUENZA VACCINE  Completed    Physical Exam: Vitals:   02/24/19 1508  BP: 118/76  Pulse: 72  Temp: (!) 97.3 F (36.3 C)  TempSrc: Temporal  SpO2: 98%  Weight: 137 lb (62.1 kg)  Height: '5\' 3"'  (1.6 m)   Body mass index is 24.27 kg/m. Physical Exam Vitals signs reviewed.  Constitutional:      General: She is not in acute distress.    Appearance: Normal appearance. She is normal weight. She is not toxic-appearing.  HENT:     Head: Normocephalic and atraumatic.     Right Ear:  External ear normal.     Left Ear: External ear normal.     Ears:     Comments:  Hearing aids bilaterally Eyes:     Pupils: Pupils are equal, round, and reactive to light.     Comments: glasses  Cardiovascular:     Rate and Rhythm: Normal rate and regular rhythm.     Heart sounds: Murmur present.  Pulmonary:     Effort: Pulmonary effort is normal.     Breath sounds: No wheezing or rales.  Abdominal:     General: Bowel sounds are normal.     Palpations: Abdomen is soft.     Tenderness: There is no abdominal tenderness. There is no guarding or rebound.     Hernia: A hernia is present.     Comments: Does have bulging on left side just superior to waistline and prior surgical site/hernia repair site  Musculoskeletal:     Right lower leg: Edema present.     Left lower leg: Edema present.     Comments: Left greater than right ankle swelling--admits to soup intake  Skin:    General: Skin is warm and dry.     Capillary Refill: Capillary refill takes less than 2 seconds.  Neurological:     General: No focal deficit present.     Mental Status: She is alert and oriented to person, place, and time.     Cranial Nerves: No cranial nerve deficit.  Psychiatric:        Mood and Affect: Mood normal.        Behavior: Behavior normal.     Comments: Near tears talking about how when she came for her visit, the waiting room was the last place where she saw Jenny Reichmann before he passed away (they brought him there to meet with her during covid isolation)     Labs reviewed: Basic Metabolic Panel: Recent Labs    03/09/18 1553 01/12/19 1219  NA 142 142  K 4.0 4.2  CL 100 100  CO2 23 26  GLUCOSE 92 87  BUN 27 19  CREATININE 1.04* 0.90  CALCIUM 9.5 9.9   Liver Function Tests: No results for input(s): AST, ALT, ALKPHOS, BILITOT, PROT, ALBUMIN in the last 8760 hours. No results for input(s): LIPASE, AMYLASE in the last 8760 hours. No results for input(s): AMMONIA in the last 8760 hours. CBC: Recent Labs    03/09/18 1553 01/12/19 1219  WBC 9.0 7.7  HGB 12.9 13.7  HCT 37.0  41.4  MCV 94 96  PLT 276 298   Lipid Panel: No results for input(s): CHOL, HDL, LDLCALC, TRIG, CHOLHDL, LDLDIRECT in the last 8760 hours. Lab Results  Component Value Date   HGBA1C 5.7 (H) 06/25/2017   Assessment/Plan 1. Grief reaction -seems to be gradually improving after husband's death -offered support if needed  2. Chronic diastolic congestive heart failure ([place]) -counseled on avoiding   3. COPD mixed type ([place]) -remains dyspneic on exertion with periods of wheezing, but did not really c/o this today as she sometimes does--seeing Dr. [name] next week -cont symbicort, singulair routinely and proair prn  4. Chronic deep vein thrombosis (DVT) of distal vein of both lower extremities (HCC) -on chronic xarelto therapy w/o any signs of bleeding  5. Need for vaccination with 13-polyvalent pneumococcal conjugate vaccine - Pneumococcal conjugate vaccine 13-valent given here today  6. Need for Tdap vaccination - Tdap ([place]) 5-2.5-18.5 LF-MCG/0.5 injection; Inject 0.5 mLs into the muscle once for 1 dose.  Dispense:  0.5 mL; Refill: 0  7.  Constipation -new issue past couple of weeks -had not been exercising as much, eating less so suspect these were triggers -ok to take the extra dose magnesium, but could also use miralax prn if not successful with walking, hydration, fiber-rich diet and the magnesium  8.  Incisional hernia -will address constipation as in #7 and monitor this area on her left side/flank -if worsening, becoming painful or changing in appearance, would refer to [facility], but hopefully will not have to do this with her lung disease, CHF b/c I'm concerned about her losing her independence if she requires surgery  [facility]tests [facility] [address]  Next appt:  4 mos med mgt  Sharra Cayabyab L. Kian Ottaviano, D.O. [facility] [address], [place] [address] Cell Phone (Mon-Fri 8am-5pm):   [phone] On Call:  [phone] & follow prompts after 5pm & weekends Office Phone:  [phone] Office Fax:  [phone]

## 2019-03-01 ENCOUNTER — Ambulatory Visit (INDEPENDENT_AMBULATORY_CARE_PROVIDER_SITE_OTHER): Payer: Medicare Other | Admitting: Internal Medicine

## 2019-03-01 ENCOUNTER — Encounter: Payer: Self-pay | Admitting: Internal Medicine

## 2019-03-01 ENCOUNTER — Other Ambulatory Visit: Payer: Self-pay

## 2019-03-01 DIAGNOSIS — J449 Chronic obstructive pulmonary disease, unspecified: Secondary | ICD-10-CM

## 2019-03-01 DIAGNOSIS — I5032 Chronic diastolic (congestive) heart failure: Secondary | ICD-10-CM | POA: Diagnosis not present

## 2019-03-01 MED ORDER — PREDNISONE 5 MG PO TABS: ORAL_TABLET | ORAL | 0 refills | Status: DC

## 2019-03-01 NOTE — Assessment & Plan Note (Signed)
ProBNP recently 1, 334- might possibly relate to her DOE and cough with clear mucus.  She is to discuss with cardiology.

## 2019-03-01 NOTE — Assessment & Plan Note (Signed)
Symbicort should be as good as we can do with inhaler while avoiding LAMA due to glaucoma. Plan- she will try using her rescue inhaler to see what effect that has.  Try low dose prednisone for a couple of weeks.

## 2019-03-01 NOTE — Progress Notes (Signed)
Subjective:    Patient ID: Jody Blake, female    DOB: 06-23-1925, 85 y.o.   MRN: [id number]  HPI F never smoker, followed for allergic rhinitis, chronic bronchitis, complicated by GERD, Hx PAfib/ pacemaker. ------------------------------------------------------------------------------------------------  10/29/2018- 85 year old female never smoker followed for Allergic rhinitis, COPD mixed type, complicated by GERD,  AFib/pacemaker/ Xarelto, Glaucoma/ macular degen, dCHF, AoStenosis, CKDIII, TIA Seen by NP in November for bronchitis> depo 80, doxycycline, tessalon. Sent Zpak in December. -----pt reports increase SOB w/ exercise, especially in the mornings & congestion in throat area.  Symbicort 160, Proair hfa, Singulair, Avoiding LAMAs due to glaucoma Gradually more aware of some DOE. Not sure if it is hot weather. Feels CHF control is stable on furosemide. Cough prod clear mucus, no fever. CXR 10/08/17-  Chronic lung changes and emphysema without superimposed acute cardiopulmonary disease. Left chest wall cardiac pacing device is unchanged.  03/01/2019-  Virtual Visit via Telephone Note  I connected with Jody Blake on 03/01/19 at  2:30 PM EST by telephone and verified that I am speaking with the correct person using two identifiers.  Location: Patient: H Provider: O   I discussed the limitations, risks, security and privacy concerns of performing an evaluation and management service by telephone and the availability of in person appointments. I also discussed with the patient that there may be a patient responsible charge related to this service. The patient expressed understanding and agreed to proceed.   History of Present Illness: 85 year old female never smoker followed for Allergic rhinitis, COPD mixed type, complicated by GERD,  AFib/pacemaker/ Xarelto, Glaucoma/ macular degen, dCHF, AoStenosis, CKDIII, TIA  Widowed and grieving this Fall. Singulair, Proair hfa,  Symbicort 160, flonase, astelin DOE hasn't changed much since last here. Productive cough only clear mucus. Not wheezing so she hasn't been using rescue inhaler. Discussed elevated BNP as possibly related to DOE and cough. Has cardiology f/u pending.  She thought depomedrol injection had helped breathing last visit- discussed limited trial of maintenance prednisone.   Observations/Objective: CXR 10/29/2018- No acute cardiopulmonary disease. ProBNP 1 month ago was 1,334 H   Assessment and Plan: COPD- not convinced cough and DOE are pulmonary- try pred 5 mg daily x 2 weeks. dCHF- cough and dyspnea might both reflect her elevated BNP- she will discuss with cardiology.  Follow Up Instructions: 6 months   I discussed the assessment and treatment plan with the patient. The patient was provided an opportunity to ask questions and all were answered. The patient agreed with the plan and demonstrated an understanding of the instructions.   The patient was advised to call back or seek an in-person evaluation if the symptoms worsen or if the condition fails to improve as anticipated.  I provided 18 minutes of non-face-to-face time during this encounter.   Baird Lyons, MD     ROS-see HPI   + = positive Constitutional:    weight loss, night sweats, fevers, chills, fatigue, lassitude. HEENT:    headaches, difficulty swallowing, tooth/dental problems, sore throat,       sneezing, itching, ear ache, nasal congestion, post nasal drip, snoring CV:    chest pain, orthopnea, PND, swelling in lower extremities, anasarca,                                                    dizziness, palpitations  Resp:   shortness of breath with exertion or at rest.                +productive cough,   +non-productive cough, coughing up of blood.              change in color of mucus.  +wheezing.   Skin:    rash or lesions. GI:  No-   heartburn, indigestion, abdominal pain, nausea, vomiting,  GU:  MS:   joint pain,  stiffness,  Neuro-     nothing unusual Psych:  change in mood or affect.  depression or anxiety.   memory loss.  Objective:  OBJ- Physical Exam General- Alert, Oriented, Affect-appropriate, Distress- none acute Skin- rash-none, lesions- none, excoriation- none Lymphadenopathy- none Head- atraumatic            Eyes- Gross vision intact, PERRLA, conjunctivae and secretions clear            Ears- + Hard of hearing            Nose- Clear, no-Septal dev, mucus, polyps, erosion, perforation             Throat- Mallampati II , mucosa clear , drainage- none, tonsils- atrophic Neck- flexible , trachea midline, no stridor , thyroid nl, carotid no bruit Chest - symmetrical excursion , unlabored           Heart/CV- RRR , no murmur , no gallop  , no rub, nl s1 s2                           - JVD- none , edema+ elastic hose, stasis changes- none, varices- none           Lung-  Wheeze+ mild, cough+slight , dullness -none, rub- none           Chest wall-+ left pacemaker.  Abd-   Br/ Gen/ Rectal- Not done, not indicated Extrem- cyanosis- none, clubbing, none, atrophy- none, strength- nl Neuro- grossly intact to observation    Assessment & Plan:

## 2019-03-01 NOTE — Patient Instructions (Addendum)
Try using your albuterol rescue inhaler occasionally, to see if it helps your breathing and cough.  Script sent to try prednisone 5 mg daily x 2 weeks  Ask your cardiologist about a possible connection between your elevated BNP lab test and your bothersome shortness of breath and your cough with clear mucus.

## 2019-03-02 ENCOUNTER — Other Ambulatory Visit: Payer: Self-pay | Admitting: Internal Medicine

## 2019-03-11 ENCOUNTER — Ambulatory Visit (INDEPENDENT_AMBULATORY_CARE_PROVIDER_SITE_OTHER): Payer: Medicare Other | Admitting: *Deleted

## 2019-03-11 ENCOUNTER — Telehealth: Payer: Self-pay | Admitting: Internal Medicine

## 2019-03-11 DIAGNOSIS — Z950 Presence of cardiac pacemaker: Secondary | ICD-10-CM

## 2019-03-11 LAB — CUP PACEART REMOTE DEVICE CHECK
Battery Impedance: 254 Ohm
Battery Remaining Longevity: 119 mo
Battery Voltage: 2.79 V
Brady Statistic RV Percent Paced: 99 %
Date Time Interrogation Session: 20201203161702
Implantable Lead Implant Date: 20090318
Implantable Lead Implant Date: 20090318
Implantable Lead Location: 753859
Implantable Lead Location: 753860
Implantable Lead Model: 5076
Implantable Lead Model: 5092
Implantable Pulse Generator Implant Date: 20160202
Lead Channel Impedance Value: 67 Ohm
Lead Channel Impedance Value: 694 Ohm
Lead Channel Pacing Threshold Amplitude: 1.125 V
Lead Channel Pacing Threshold Pulse Width: 0.4 ms
Lead Channel Setting Pacing Amplitude: 2.5 V
Lead Channel Setting Pacing Pulse Width: 0.4 ms
Lead Channel Setting Sensing Sensitivity: 4 mV

## 2019-03-11 NOTE — Telephone Encounter (Signed)
New Message:   Pt said her husband passed away a few month ago. She wants to know if she sent all transmission that she was supposed to.

## 2019-03-11 NOTE — Telephone Encounter (Signed)
I spoke with the pt and let her know she did missed a transmission on 03-03-2019. The pt agreed to send one Today. I told her once I see it I will add her to the schedule. The pt verbalized understanding.

## 2019-03-15 ENCOUNTER — Encounter: Payer: Self-pay | Admitting: Cardiology

## 2019-03-15 ENCOUNTER — Ambulatory Visit (INDEPENDENT_AMBULATORY_CARE_PROVIDER_SITE_OTHER): Payer: Medicare Other | Admitting: Cardiology

## 2019-03-15 ENCOUNTER — Other Ambulatory Visit: Payer: Self-pay

## 2019-03-15 VITALS — BP 122/70 | HR 84 | Ht 63.0 in | Wt 139.0 lb

## 2019-03-15 DIAGNOSIS — I350 Nonrheumatic aortic (valve) stenosis: Secondary | ICD-10-CM

## 2019-03-15 DIAGNOSIS — I5032 Chronic diastolic (congestive) heart failure: Secondary | ICD-10-CM

## 2019-03-15 DIAGNOSIS — I4821 Permanent atrial fibrillation: Secondary | ICD-10-CM | POA: Diagnosis not present

## 2019-03-15 NOTE — Patient Instructions (Addendum)
Medication Instructions:  Please take an extra Furosemide 40 mg daily for 4 days then return to your normal daily dose. Continue all other medications as listed.  *If you need a refill on your cardiac medications before your next appointment, please call your pharmacy*  Follow-Up: At [facility], you and your health needs are our priority.  As part of our continuing mission to provide you with exceptional heart care, we have created designated Provider Care Teams.  These Care Teams include your primary Cardiologist (physician) and Advanced Practice Providers (APPs -  Physician Assistants and Nurse Practitioners) who all work together to provide you with the care you need, when you need it.  Your next appointment:   2 month(s)  The format for your next appointment:   In Person  Provider:   Truitt Merle, NP  Thank you for choosing [facility]!!    Please limit your fluid intake to 1.5 liters/1,500 ml per day. Weigh daily and keep a log of your weights.

## 2019-03-15 NOTE — Progress Notes (Signed)
Cardiology Office Note:    Date:  03/15/2019   ID:  Jody Blake, DOB 11-11-1925, MRN [id number]  PCP:  Gayland Curry, DO  Cardiologist:  Candee Furbish, MD  Electrophysiologist:  None   Referring MD: Gayland Curry, DO     History of Present Illness:    Jody Blake is a 85 y.o. female here for follow-up of pacemaker, complete heart block. - Had AV nodal ablation secondary to atrial fibrillation.  Could not control with antiarrhythmics.  I take care of her son as well.  Overall she has been dealing with more shortness of breath with activity bothering her extensively at night.  Most recent echocardiogram-moderate aortic stenosis.  Dilated atria  BNP was elevated in the 1000 range.  Dr. [name] wonders if fluid overload could be playing a role given the elevated BNP.  Certainly this could be playing a role and with elevated filling pressures given her dilated atria.  She has been drinking a lot of water.  We have asked her to drink 1.5 L a day and I have given her the ability to take an additional Lasix.    Denies any fevers chills weight loss.     Past Medical History:  Diagnosis Date  . Anemia   . Aortic stenosis    a. Echo 09/06/12 EF 55-60%, no WMA, G2DD, Ao valve sclerosis w/ mod stenosis, LA mildly dilated, PA pressure 66mmHg  . Asthmatic bronchitis   . Complete heart block [facility])    s/p permanent pacemaker 06/27/1999 (Battery change 06/2007 and 2016).  s/p AV nodal ablation by Dr [name] 2016.  [name] COPD (chronic obstructive pulmonary disease) (HCC)    Dr. [name]  . DDD (degenerative disc disease)   . Diastolic dysfunction    a. Echo 09/06/12 EF 55-60%, no WMA, G2DD, Ao valve sclerosis w/ mod stenosis, LA mildly dilated, PA pressure 11mmHg  . Diverticulosis   . DVT (deep vein thrombosis) in pregnancy 1954   a. LLE  . Hypertension   . Hypothyroidism    on medication  . Macular degeneration    Dr. [name]  . Osteoarthrosis, unspecified whether generalized or localized,  other specified sites   . PAF (paroxysmal atrial fibrillation) (HCC)    Stopped flecainide, on amiodarone but still has bouts of A FIB (mostly in mornings).   . Pure hypercholesterolemia   . Staghorn calculus    Left    Past Surgical History:  Procedure Laterality Date  . APPENDECTOMY  ~ 1941  . AV NODE ABLATION  05/10/2014  . AV NODE ABLATION N/A 05/10/2014   Procedure: AV NODE ABLATION;  Surgeon: Thompson Grayer, MD;  Location: [facility] CATH LAB;  Service: Cardiovascular;  Laterality: N/A;  . BUNIONECTOMY WITH HAMMERTOE RECONSTRUCTION Bilateral ~ 1990  . CARDIAC PACEMAKER PLACEMENT  06/27/99   Medtronic PM implanted by Dr [name]  . CARDIOVERSION N/A 12/02/2013   Procedure: CARDIOVERSION;  Surgeon: [name], MD;  Location: [facility] ENDOSCOPY;  Service: Cardiovascular;  Laterality: N/A;  . CATARACT EXTRACTION W/ INTRAOCULAR LENS  IMPLANT, BILATERAL Bilateral   . COLONOSCOPY WITH PROPOFOL N/A 04/22/2013   Procedure: COLONOSCOPY WITH PROPOFOL;  Surgeon: [name], MD;  Location: [facility] ENDOSCOPY;  Service: Endoscopy;  Laterality: N/A;  . DILATION AND CURETTAGE OF UTERUS  X 2   "when I was going thru menopause"  . ESOPHAGOGASTRODUODENOSCOPY (EGD) WITH PROPOFOL N/A 04/22/2013   Procedure: ESOPHAGOGASTRODUODENOSCOPY (EGD) WITH PROPOFOL;  Surgeon: [name], MD;  Location: [facility] ENDOSCOPY;  Service: Endoscopy;  Laterality: N/A;  . HERNIA REPAIR    . INCISIONAL HERNIA REPAIR    . INSERT / REPLACE / REMOVE PACEMAKER  06/2007   "took out the old; put in new"  . INSERT / REPLACE / REMOVE PACEMAKER  05/10/2014   MDT PPM generator change by Dr [name]  . JOINT REPLACEMENT    . PARTIAL NEPHRECTOMY Left 05/1974   stone disease  . PERMANENT PACEMAKER GENERATOR CHANGE N/A 05/10/2014   Procedure: PERMANENT PACEMAKER GENERATOR CHANGE;  Surgeon: Thompson Grayer, MD;  Location: [facility] CATH LAB;  Service: Cardiovascular;  Laterality: N/A;  . TONSILLECTOMY AND ADENOIDECTOMY  1930's  . TOTAL KNEE ARTHROPLASTY Right 2001     Current Medications: Current Meds  Medication Sig  . amLODipine (NORVASC) 5 MG tablet TAKE 1 TABLET BY MOUTH  DAILY  . atorvastatin (LIPITOR) 40 MG tablet TAKE 1 TABLET BY MOUTH ONCE DAILY FOR CHOLESTEROL  . azelastine (ASTELIN) 0.1 % nasal spray USE 1 TO 2 SPRAYS INTO BOTH NOSTRILS TWICE DAILY.  [name] Biotin 5000 MCG CAPS Take 5,000 mg by mouth daily.   . Cholecalciferol (VITAMIN D) 2000 units CAPS Take 1 capsule by mouth daily.  . fluticasone (FLONASE) 50 MCG/ACT nasal spray USE 2 SPRAYS EACH NOSTRIL ONCE A DAY AS NEEDED FOR ALLERGIES OR CONGESTION.  . furosemide (LASIX) 40 MG tablet TAKE 1 TABLET BY MOUTH  DAILY  . gabapentin (NEURONTIN) 100 MG capsule TAKE 1 CAPSULE BY MOUTH AT  BEDTIME  . latanoprost (XALATAN) 0.005 % ophthalmic solution Place 1 drop into both eyes at bedtime.  [name] levothyroxine (SYNTHROID, LEVOTHROID) 88 MCG tablet TAKE 1 TABLET BY MOUTH  DAILY BEFORE BREAKFAST  . losartan (COZAAR) 100 MG tablet Take 1 tablet (100 mg total) by mouth daily. At bedtime  . losartan (COZAAR) 50 MG tablet TAKE 1 TABLET BY MOUTH 2  TIMES DAILY. TAKE WITH  100MG  TABLET.  . LUTEIN PO Take 25 mg by mouth daily.   . Magnesium 250 MG TABS Take 1 tablet by mouth daily.  . metoprolol succinate (TOPROL-XL) 50 MG 24 hr tablet TAKE 1 TABLET BY MOUTH IN  THE EVENING  . montelukast (SINGULAIR) 10 MG tablet TAKE 1 TABLET BY MOUTH  DAILY  . Multiple Vitamins-Minerals (PRESERVISION AREDS 2) CAPS Take 1 capsule by mouth daily.  . potassium chloride SA (K-DUR) 20 MEQ tablet TAKE 1 TABLET BY MOUTH TWICE DAILY.  [name] predniSONE (DELTASONE) 5 MG tablet 1 daily x 14 days  . SYMBICORT 160-4.5 MCG/ACT inhaler INHALE 2 PUFFS BY MOUTH  INTO THE LUNGS TWO TIMES  DAILY  . VENTOLIN HFA 108 (90 Base) MCG/ACT inhaler USE 2 PUFFS EVERY 6 HOURS AS NEEDED FOR SHORTNESS OF BREATH AND WHEEZING.  . vitamin B-12 (CYANOCOBALAMIN) 1000 MCG tablet Take 1,000 mcg by mouth daily.  Alveda Reasons 15 MG TABS tablet TAKE 1 TABLET BY MOUTH  DAILY  WITH SUPPER     Allergies:   Brimonidine tartrate-timolol, Penicillins, and Breo ellipta [fluticasone furoate-vilanterol]   Social History   Socioeconomic History  . Marital status: Married    Spouse name: Not on file  . Number of children: 2  . Years of education: Not on file  . Highest education level: Not on file  Occupational History  . Occupation: RETIRED    Employer: RETIRED    Comment: [employer]  . Financial resource strain: Not hard at all  . Food insecurity    Worry: Never true    Inability:  Never true  . Transportation needs    Medical: No    Non-medical: No  Tobacco Use  . Smoking status: Never Smoker  . Smokeless tobacco: Never Used  Substance and Sexual Activity  . Alcohol use: Yes    Alcohol/week: 7.0 standard drinks    Types: 7 Glasses of wine per week    Comment: occasionally  . Drug use: No  . Sexual activity: Not Currently    Partners: Male  Lifestyle  . Physical activity    Days per week: 7 days    Minutes per session: 30 min  . Stress: Not at all  Relationships  . Social connections    Talks on phone: More than three times a week    Gets together: More than three times a week    Attends religious service: More than 4 times per year    Active member of club or organization: Yes    Attends meetings of clubs or organizations: More than 4 times per year    Relationship status: Married  Other Topics Concern  . Not on file  Social History Narrative   Diet? Low salt, low fat      Do you drink/eat things with caffeine? yes      Marital status?                 married                   What year were you married? 1949      Do you live in a house, apartment, assisted living, condo, trailer, etc.? apartment      Is it one or more stories? 3 stories      How many persons live in your home? Just me      Do you have any pets in your home? (please list) no      Current or past profession: accounting      Do you  exercise?          yes                            Type & how often? Walk, class, prescribed daily      Do you have a living will? yes      Do you have a DNR form?     yes                             If not, do you want to discuss one?      Do you have signed POA/HPOA for forms? no           Family History: The patient's family history includes Breast cancer in her mother; Heart attack in her brother; Hypertension in her father; Malignant hypertension in her father; Renal Disease in her father; Stroke in her brother.  ROS:   Please see the history of present illness.    Positive for cough all other systems reviewed and are negative.  EKGs/Labs/Other Studies Reviewed:    The following studies were reviewed today: Prior office notes, echocardiogram EKG reviewed  EKG:  EKG is  ordered today.  The ekg ordered today demonstrates 03/15/2019-appears to be sinus rhythm with ventricular pacing although she has had underlying atrial fibrillation previously.  March 09, 2018 ventricular paced rhythm 64 atrial fibrillation underlying personally reviewed  Recent Labs: 01/12/2019: BUN 19; Creatinine, Ser  0.90; Hemoglobin 13.7; NT-Pro BNP 1,334; Platelets 298; Potassium 4.2; Sodium 142  Recent Lipid Panel    Component Value Date/Time   CHOL 138 11/04/2017 0600   TRIG 53 11/04/2017 0600   HDL 70 11/04/2017 0600   CHOLHDL 3.1 06/25/2017 0229   VLDL 11 06/25/2017 0229   LDLCALC 58 11/04/2017 0600    Physical Exam:    VS:  BP 122/70   Pulse 84   Ht 5\' 3"  (1.6 m)   Wt 139 lb (63 kg)   LMP 04/08/1974   SpO2 98%   BMI 24.62 kg/m     Wt Readings from Last 3 Encounters:  03/15/19 139 lb (63 kg)  02/24/19 137 lb (62.1 kg)  01/12/19 137 lb (62.1 kg)    GEN: Well nourished, well developed, in no acute distress  HEENT: normal  Neck: no JVD, carotid bruits, or masses Cardiac: RRR; 2/6 systolic murmur right upper sternal border, no rubs, or gallops, minor ankle edema bilaterally  Respiratory:  clear to auscultation bilaterally, normal work of breathing GI: soft, nontender, nondistended, + BS MS: no deformity or atrophy  Skin: warm and dry, no rash Neuro:  Alert and Oriented x 3, Strength and sensation are intact Psych: euthymic mood, full affect   ASSESSMENT:    1. Permanent atrial fibrillation ([place])   2. Chronic diastolic heart failure ([place])   3. Aortic valve stenosis, etiology of cardiac valve disease unspecified    PLAN:    In order of problems listed above:  Permanent atrial fibrillation with pacemaker placement after complete heart block following AV nodal ablation - Doing well.  84 bpm on ECG.  No changes. -Dr. [name] has been monitoring  Chronic anticoagulation - Xarelto reduced dose 15 mg.  No bleeding.  We will check a CBC and a basic metabolic profile  Essential hypertension -Well-controlled today.  Continue to monitor.  History of TIA x2.  Moderate aortic valve stenosis -Continue to monitor  Chronic bronchitis/emphysema/chronic diastolic heart failure -Dr. [name] has evaluated.  Did mention that she does have an elevated BNP.  She does have dilated atria.  I think it would make sense for [place] to trial a course of increased diuretic with Lasix 40 mg twice a day for 4 days and then go back to 40 mg once a day.  Watch her daily weights with scale.  Her BNP was elevated.  Dilated atria may be indicative of elevated filling pressures.  She is just frustrated that she cannot walk as far without being short of breath.  Medication Adjustments/Labs and Tests Ordered: Current medicines are reviewed at length with the patient today.  Concerns regarding medicines are outlined above.  Orders Placed This Encounter  Procedures  . EKG 12-Lead   No orders of the defined types were placed in this encounter.   Patient Instructions  Medication Instructions:  Please take an extra Furosemide 40 mg daily for 4 days then return to your normal daily dose.  Continue all other medications as listed.  *If you need a refill on your cardiac medications before your next appointment, please call your pharmacy*  Follow-Up: At [facility], you and your health needs are our priority.  As part of our continuing mission to provide you with exceptional heart care, we have created designated Provider Care Teams.  These Care Teams include your primary Cardiologist (physician) and Advanced Practice Providers (APPs -  Physician Assistants and Nurse Practitioners) who all work together to provide you with the care you need,  when you need it.  Your next appointment:   2 month(s)  The format for your next appointment:   In Person  Provider:   Truitt Merle, NP  Thank you for choosing [facility]!!    Please limit your fluid intake to 1.5 liters/1,500 ml per day. Weigh daily and keep a log of your weights.    Signed, Candee Furbish, MD  03/15/2019 12:24 PM    [facility]

## 2019-03-16 ENCOUNTER — Telehealth: Payer: Self-pay | Admitting: Internal Medicine

## 2019-03-16 MED ORDER — PREDNISONE 5 MG PO TABS: 5.0000 mg | ORAL_TABLET | Freq: Every day | ORAL | 1 refills | Status: DC

## 2019-03-16 NOTE — Telephone Encounter (Signed)
rx sent to pharmacy as requested.  Pt aware of recs.  Nothing further needed at this time- will close encounter.

## 2019-03-16 NOTE — Telephone Encounter (Signed)
Spoke with pt, requesting a refill of prednisone 5mg  tabs. Per 11/23 phone note this was just to be taken short-term. Pt states that this has helped her breathing and would like to continue taking this.   Pharmacy: [facility].    CY please advise on prednisone request.  Thanks!

## 2019-03-16 NOTE — Telephone Encounter (Signed)
Ok to refill prednisone 5 mg, # 30, 1 daily, ref x 1

## 2019-03-19 ENCOUNTER — Other Ambulatory Visit: Payer: Self-pay | Admitting: Internal Medicine

## 2019-03-19 NOTE — Telephone Encounter (Signed)
Dr. [name], please advise if it is okay to refill med for pt.  Allergies  Allergen Reactions  . Brimonidine Tartrate-Timolol Other (See Comments)    REACTION: systemic malaise amigen eye drop  . Penicillins Hives  . Breo Ellipta [Fluticasone Furoate-Vilanterol] Other (See Comments)    Ran blood pressure up Increased blood pressure     Current Outpatient Medications:  .  amLODipine (NORVASC) 5 MG tablet, TAKE 1 TABLET BY MOUTH  DAILY, Disp: 90 tablet, Rfl: 3 .  atorvastatin (LIPITOR) 40 MG tablet, TAKE 1 TABLET BY MOUTH ONCE DAILY FOR CHOLESTEROL, Disp: 90 tablet, Rfl: 1 .  azelastine (ASTELIN) 0.1 % nasal spray, USE 1 TO 2 SPRAYS INTO BOTH NOSTRILS TWICE DAILY., Disp: 30 mL, Rfl: PRN .  Biotin 5000 MCG CAPS, Take 5,000 mg by mouth daily. , Disp: , Rfl:  .  Cholecalciferol (VITAMIN D) 2000 units CAPS, Take 1 capsule by mouth daily., Disp: , Rfl:  .  fluticasone (FLONASE) 50 MCG/ACT nasal spray, USE 2 SPRAYS EACH NOSTRIL ONCE A DAY AS NEEDED FOR ALLERGIES OR CONGESTION., Disp: 16 g, Rfl: PRN .  furosemide (LASIX) 40 MG tablet, TAKE 1 TABLET BY MOUTH  DAILY, Disp: 90 tablet, Rfl: 1 .  gabapentin (NEURONTIN) 100 MG capsule, TAKE 1 CAPSULE BY MOUTH AT  BEDTIME, Disp: 90 capsule, Rfl: 3 .  latanoprost (XALATAN) 0.005 % ophthalmic solution, Place 1 drop into both eyes at bedtime., Disp: , Rfl:  .  levothyroxine (SYNTHROID, LEVOTHROID) 88 MCG tablet, TAKE 1 TABLET BY MOUTH  DAILY BEFORE BREAKFAST, Disp: 90 tablet, Rfl: 1 .  losartan (COZAAR) 100 MG tablet, Take 1 tablet (100 mg total) by mouth daily. At bedtime, Disp: 90 tablet, Rfl: 3 .  losartan (COZAAR) 50 MG tablet, TAKE 1 TABLET BY MOUTH 2  TIMES DAILY. TAKE WITH  100MG  TABLET., Disp: 180 tablet, Rfl: 1 .  LUTEIN PO, Take 25 mg by mouth daily. , Disp: , Rfl:  .  Magnesium 250 MG TABS, Take 1 tablet by mouth daily., Disp: , Rfl:  .  metoprolol succinate (TOPROL-XL) 50 MG 24 hr tablet, TAKE 1 TABLET BY MOUTH IN  THE EVENING, Disp: 90 tablet,  Rfl: 3 .  montelukast (SINGULAIR) 10 MG tablet, TAKE 1 TABLET BY MOUTH  DAILY, Disp: 90 tablet, Rfl: 3 .  Multiple Vitamins-Minerals (PRESERVISION AREDS 2) CAPS, Take 1 capsule by mouth daily., Disp: , Rfl:  .  potassium chloride SA (K-DUR) 20 MEQ tablet, TAKE 1 TABLET BY MOUTH TWICE DAILY., Disp: 60 tablet, Rfl: 6 .  predniSONE (DELTASONE) 5 MG tablet, Take 1 tablet (5 mg total) by mouth daily with breakfast., Disp: 30 tablet, Rfl: 1 .  SYMBICORT 160-4.5 MCG/ACT inhaler, INHALE 2 PUFFS BY MOUTH  INTO THE LUNGS TWO TIMES  DAILY, Disp: 30.6 g, Rfl: 3 .  VENTOLIN HFA 108 (90 Base) MCG/ACT inhaler, USE 2 PUFFS EVERY 6 HOURS AS NEEDED FOR SHORTNESS OF BREATH AND WHEEZING., Disp: 18 g, Rfl: 0 .  vitamin B-12 (CYANOCOBALAMIN) 1000 MCG tablet, Take 1,000 mcg by mouth daily., Disp: , Rfl:  .  XARELTO 15 MG TABS tablet, TAKE 1 TABLET BY MOUTH  DAILY WITH SUPPER, Disp: 90 tablet, Rfl: 4

## 2019-03-21 NOTE — Telephone Encounter (Signed)
[facility] refill e-sent

## 2019-03-28 ENCOUNTER — Other Ambulatory Visit: Payer: Self-pay | Admitting: Internal Medicine

## 2019-03-28 DIAGNOSIS — E039 Hypothyroidism, unspecified: Secondary | ICD-10-CM

## 2019-04-06 NOTE — Progress Notes (Signed)
PPM remote 

## 2019-04-12 DIAGNOSIS — H401132 Primary open-angle glaucoma, bilateral, moderate stage: Secondary | ICD-10-CM | POA: Diagnosis not present

## 2019-04-21 ENCOUNTER — Other Ambulatory Visit: Payer: Self-pay | Admitting: Cardiology

## 2019-04-21 DIAGNOSIS — Z23 Encounter for immunization: Secondary | ICD-10-CM | POA: Diagnosis not present

## 2019-04-21 NOTE — Telephone Encounter (Signed)
Pt last saw Dr Marlou Porch 03/15/19, last labs 01/12/19 Creat 0.90, age 86, weight 63kg, CrCl 40.49, based on CrCl pt is on appropriate dosage of Xarelto 15mg  QD.  Will refill rx.

## 2019-05-10 ENCOUNTER — Other Ambulatory Visit: Payer: Self-pay | Admitting: Cardiology

## 2019-05-10 MED ORDER — RIVAROXABAN 15 MG PO TABS: ORAL_TABLET | ORAL | 1 refills | Status: DC

## 2019-05-10 NOTE — Telephone Encounter (Signed)
Pt last saw Dr Marlou Porch 03/15/19, last labs 01/12/19 Creat 0.90, age 86, weight 63kg, CrCl 40.49, based on CrCl pt is on appropriate dosage of Xarelto 15mg  QD.  Will refill rx.

## 2019-05-10 NOTE — Telephone Encounter (Signed)
Pt calling needed a 10 day supply sent to [facility], until mail order arrives. Please address

## 2019-05-13 ENCOUNTER — Telehealth: Payer: Self-pay | Admitting: Internal Medicine

## 2019-05-13 ENCOUNTER — Other Ambulatory Visit: Payer: Self-pay | Admitting: Internal Medicine

## 2019-05-13 MED ORDER — PREDNISONE 5 MG PO TABS: 5.0000 mg | ORAL_TABLET | Freq: Every day | ORAL | 1 refills | Status: DC

## 2019-05-13 NOTE — Telephone Encounter (Signed)
Called and spoke with pt who is requesting to have her prednisone refilled. Pt states that she does take the rx every day. Looking back at pt's last OV 03/01/19 with CY, he had prescribed the prednisone as a 2 week trial originally.  Dr. [name], please advise if you are okay with [place] refilling med for pt.  Allergies  Allergen Reactions  . Brimonidine Tartrate-Timolol Other (See Comments)    REACTION: systemic malaise amigen eye drop  . Penicillins Hives  . Breo Ellipta [Fluticasone Furoate-Vilanterol] Other (See Comments)    Ran blood pressure up Increased blood pressure     Current Outpatient Medications:  .  amLODipine (NORVASC) 5 MG tablet, TAKE 1 TABLET BY MOUTH  DAILY, Disp: 90 tablet, Rfl: 3 .  atorvastatin (LIPITOR) 40 MG tablet, TAKE 1 TABLET BY MOUTH ONCE DAILY FOR CHOLESTEROL, Disp: 90 tablet, Rfl: 1 .  azelastine (ASTELIN) 0.1 % nasal spray, USE 1 TO 2 SPRAYS INTO BOTH NOSTRILS TWICE DAILY., Disp: 30 mL, Rfl: PRN .  benzonatate (TESSALON) 200 MG capsule, TAKE 1 CAPSULE BY MOUTH THREE TIMES DAILY AS NEEDED FOR COUGH., Disp: 30 capsule, Rfl: 0 .  Biotin 5000 MCG CAPS, Take 5,000 mg by mouth daily. , Disp: , Rfl:  .  Cholecalciferol (VITAMIN D) 2000 units CAPS, Take 1 capsule by mouth daily., Disp: , Rfl:  .  fluticasone (FLONASE) 50 MCG/ACT nasal spray, USE 2 SPRAYS EACH NOSTRIL ONCE A DAY AS NEEDED FOR ALLERGIES OR CONGESTION., Disp: 16 g, Rfl: PRN .  furosemide (LASIX) 40 MG tablet, TAKE 1 TABLET BY MOUTH  DAILY, Disp: 90 tablet, Rfl: 1 .  gabapentin (NEURONTIN) 100 MG capsule, TAKE 1 CAPSULE BY MOUTH AT  BEDTIME, Disp: 90 capsule, Rfl: 3 .  latanoprost (XALATAN) 0.005 % ophthalmic solution, Place 1 drop into both eyes at bedtime., Disp: , Rfl:  .  levothyroxine (SYNTHROID) 88 MCG tablet, TAKE 1 TABLET BY MOUTH  DAILY BEFORE BREAKFAST, Disp: 90 tablet, Rfl: 3 .  losartan (COZAAR) 100 MG tablet, Take 1 tablet (100 mg total) by mouth daily. At bedtime, Disp: 90 tablet, Rfl: 3 .   losartan (COZAAR) 50 MG tablet, TAKE 1 TABLET BY MOUTH 2  TIMES DAILY. TAKE WITH  100MG  TABLET., Disp: 180 tablet, Rfl: 1 .  LUTEIN PO, Take 25 mg by mouth daily. , Disp: , Rfl:  .  Magnesium 250 MG TABS, Take 1 tablet by mouth daily., Disp: , Rfl:  .  metoprolol succinate (TOPROL-XL) 50 MG 24 hr tablet, TAKE 1 TABLET BY MOUTH IN  THE EVENING, Disp: 90 tablet, Rfl: 3 .  montelukast (SINGULAIR) 10 MG tablet, TAKE 1 TABLET BY MOUTH  DAILY, Disp: 90 tablet, Rfl: 3 .  Multiple Vitamins-Minerals (PRESERVISION AREDS 2) CAPS, Take 1 capsule by mouth daily., Disp: , Rfl:  .  potassium chloride SA (K-DUR) 20 MEQ tablet, TAKE 1 TABLET BY MOUTH TWICE DAILY., Disp: 60 tablet, Rfl: 6 .  predniSONE (DELTASONE) 5 MG tablet, Take 1 tablet (5 mg total) by mouth daily with breakfast., Disp: 30 tablet, Rfl: 1 .  Rivaroxaban (XARELTO) 15 MG TABS tablet, TAKE 1 TABLET BY MOUTH  DAILY WITH SUPPER, Disp: 10 tablet, Rfl: 1 .  SYMBICORT 160-4.5 MCG/ACT inhaler, INHALE 2 PUFFS BY MOUTH  INTO THE LUNGS TWO TIMES  DAILY, Disp: 30.6 g, Rfl: 3 .  VENTOLIN HFA 108 (90 Base) MCG/ACT inhaler, USE 2 PUFFS EVERY 6 HOURS AS NEEDED FOR SHORTNESS OF BREATH AND WHEEZING., Disp: 18 g, Rfl: 0 .  vitamin B-12 (CYANOCOBALAMIN) 1000 MCG tablet, Take 1,000 mcg by mouth daily., Disp: , Rfl:

## 2019-05-13 NOTE — Telephone Encounter (Signed)
Ok to refill prednisone until I see her in May

## 2019-05-13 NOTE — Telephone Encounter (Signed)
Pt's pred Rx has been refilled for pt. Called and spoke with pt letting her know this had been done and she verbalized understanding. Nothing further needed.

## 2019-05-17 ENCOUNTER — Ambulatory Visit: Payer: Medicare Other | Admitting: Nurse Practitioner

## 2019-05-18 DIAGNOSIS — Z23 Encounter for immunization: Secondary | ICD-10-CM | POA: Diagnosis not present

## 2019-05-20 ENCOUNTER — Other Ambulatory Visit: Payer: Self-pay | Admitting: Internal Medicine

## 2019-05-20 DIAGNOSIS — I10 Essential (primary) hypertension: Secondary | ICD-10-CM

## 2019-05-27 NOTE — Progress Notes (Signed)
CARDIOLOGY OFFICE NOTE  Date:  06/01/2019    Jody Blake Date of Birth: [birth year] Medical Record [id number]  PCP:  Gayland Curry, DO  Cardiologist:  Servando Snare Allred  Chief Complaint  Patient presents with  . Follow-up    Seen for Dr. Marlou Porch    History of Present Illness: Jody Blake is a 86 y.o. female who presents today for a 2 month check.  Seen for Dr. Marlou Porch. She sees Dr. [name] for EP.   She has a history of CHB - s/p PPM - followed by Dr. [name]. Has also had AV nodal ablation secondary to AF that was not controllable with antiarrhythmics.   Seen for a telehealth visit back in September with Dr. [name] - endorsing more dyspnea over the past few months with orthopnea and edema. Myoview was updated to rule out ischemia. I then saw her in October - remained short of breath - I updated her echo - but her shortness of breath remained persistent - more so at night - there was concern by pulmonary that fluid overload could be playing a role - BNP was elevated.   Last seen by Dr. Marlou Porch in December. Was given the ok to use extra Lasix if needed.   The patient does not have symptoms concerning for COVID-19 infection (fever, chills, cough, or new shortness of breath).   Comes in today. Here alone. Her husband has passed since I last saw her - they were married for 24 years. She seems to be doing ok. She feels like her breathing is better - with the addition of chronic prednisone and using extra Lasix as needed. She is not dizzy. She has less swelling. She continues to walk most days - in the hallways - for 20 to 30 minutes. Her back and legs bother her but overall she is happy with how she is doing. She notes her BP typically runs a little higher at home. No chest pain. Not dizzy. She is quite happy with how she is now feeling.   Past Medical History:  Diagnosis Date  . Anemia   . Aortic stenosis    a. Echo 09/06/12 EF 55-60%, no WMA, G2DD, Ao valve sclerosis w/ mod  stenosis, LA mildly dilated, PA pressure 48mmHg  . Asthmatic bronchitis   . Complete heart block [facility])    s/p permanent pacemaker 06/27/1999 (Battery change 06/2007 and 2016).  s/p AV nodal ablation by Dr [name] 2016.  [name] COPD (chronic obstructive pulmonary disease) (HCC)    Dr. [name]  . DDD (degenerative disc disease)   . Diastolic dysfunction    a. Echo 09/06/12 EF 55-60%, no WMA, G2DD, Ao valve sclerosis w/ mod stenosis, LA mildly dilated, PA pressure 32mmHg  . Diverticulosis   . DVT (deep vein thrombosis) in pregnancy 1954   a. LLE  . Hypertension   . Hypothyroidism    on medication  . Macular degeneration    Dr. [name]  . Osteoarthrosis, unspecified whether generalized or localized, other specified sites   . PAF (paroxysmal atrial fibrillation) (HCC)    Stopped flecainide, on amiodarone but still has bouts of A FIB (mostly in mornings).   . Pure hypercholesterolemia   . Staghorn calculus    Left    Past Surgical History:  Procedure Laterality Date  . APPENDECTOMY  ~ 1941  . AV NODE ABLATION  05/10/2014  . AV NODE ABLATION N/A 05/10/2014   Procedure: AV NODE ABLATION;  Surgeon: Thompson Grayer, MD;  Location: [place] CATH LAB;  Service: Cardiovascular;  Laterality: N/A;  . BUNIONECTOMY WITH HAMMERTOE RECONSTRUCTION Bilateral ~ 1990  . CARDIAC PACEMAKER PLACEMENT  06/27/99   Medtronic PM implanted by Dr [name]  . CARDIOVERSION N/A 12/02/2013   Procedure: CARDIOVERSION;  Surgeon: [name], MD;  Location: [facility] ENDOSCOPY;  Service: Cardiovascular;  Laterality: N/A;  . CATARACT EXTRACTION W/ INTRAOCULAR LENS  IMPLANT, BILATERAL Bilateral   . COLONOSCOPY WITH PROPOFOL N/A 04/22/2013   Procedure: COLONOSCOPY WITH PROPOFOL;  Surgeon: [name], MD;  Location: [facility] ENDOSCOPY;  Service: Endoscopy;  Laterality: N/A;  . DILATION AND CURETTAGE OF UTERUS  X 2   "when I was going thru menopause"  . ESOPHAGOGASTRODUODENOSCOPY (EGD) WITH PROPOFOL N/A 04/22/2013   Procedure: ESOPHAGOGASTRODUODENOSCOPY  (EGD) WITH PROPOFOL;  Surgeon: [name], MD;  Location: [facility] ENDOSCOPY;  Service: Endoscopy;  Laterality: N/A;  . HERNIA REPAIR    . INCISIONAL HERNIA REPAIR    . INSERT / REPLACE / REMOVE PACEMAKER  06/2007   "took out the old; put in new"  . INSERT / REPLACE / REMOVE PACEMAKER  05/10/2014   MDT PPM generator change by Dr [name]  . JOINT REPLACEMENT    . PARTIAL NEPHRECTOMY Left 05/1974   stone disease  . PERMANENT PACEMAKER GENERATOR CHANGE N/A 05/10/2014   Procedure: PERMANENT PACEMAKER GENERATOR CHANGE;  Surgeon: Thompson Grayer, MD;  Location: [facility] CATH LAB;  Service: Cardiovascular;  Laterality: N/A;  . TONSILLECTOMY AND ADENOIDECTOMY  1930's  . TOTAL KNEE ARTHROPLASTY Right 2001     Medications: Current Meds  Medication Sig  . amLODipine (NORVASC) 5 MG tablet TAKE 1 TABLET BY MOUTH  DAILY  . atorvastatin (LIPITOR) 40 MG tablet TAKE 1 TABLET BY MOUTH ONCE DAILY FOR CHOLESTEROL  . azelastine (ASTELIN) 0.1 % nasal spray USE 1 TO 2 SPRAYS INTO BOTH NOSTRILS TWICE DAILY.  . benzonatate (TESSALON) 200 MG capsule TAKE 1 CAPSULE BY MOUTH THREE TIMES DAILY AS NEEDED FOR COUGH.  . Biotin 5000 MCG CAPS Take 5,000 mg by mouth daily.   . Cholecalciferol (VITAMIN D) 2000 units CAPS Take 1 capsule by mouth daily.  . fluticasone (FLONASE) 50 MCG/ACT nasal spray USE 2 SPRAYS EACH NOSTRIL ONCE A DAY AS NEEDED FOR ALLERGIES OR CONGESTION.  . furosemide (LASIX) 40 MG tablet TAKE 1 TABLET BY MOUTH  DAILY  . gabapentin (NEURONTIN) 100 MG capsule TAKE 1 CAPSULE BY MOUTH AT  BEDTIME  . latanoprost (XALATAN) 0.005 % ophthalmic solution Place 1 drop into both eyes at bedtime.  [name] levothyroxine (SYNTHROID) 88 MCG tablet TAKE 1 TABLET BY MOUTH  DAILY BEFORE BREAKFAST  . losartan (COZAAR) 100 MG tablet Take 1 tablet (100 mg total) by mouth at bedtime. At bedtime  . losartan (COZAAR) 50 MG tablet Take 1 tablet (50 mg total) by mouth daily at 12 noon.  . LUTEIN PO Take 25 mg by mouth daily.   . Magnesium 250 MG  TABS Take 1 tablet by mouth daily.  . metoprolol succinate (TOPROL-XL) 50 MG 24 hr tablet TAKE 1 TABLET BY MOUTH IN  THE EVENING  . montelukast (SINGULAIR) 10 MG tablet TAKE 1 TABLET BY MOUTH  DAILY  . Multiple Vitamins-Minerals (PRESERVISION AREDS 2) CAPS Take 1 capsule by mouth daily.  . potassium chloride SA (K-DUR) 20 MEQ tablet TAKE 1 TABLET BY MOUTH TWICE DAILY.  [name] predniSONE (DELTASONE) 5 MG tablet Take 1 tablet (5 mg total) by mouth daily with breakfast.  . Rivaroxaban (XARELTO) 15 MG TABS tablet  TAKE 1 TABLET BY MOUTH  DAILY WITH SUPPER  . SYMBICORT 160-4.5 MCG/ACT inhaler INHALE 2 PUFFS BY MOUTH  INTO THE LUNGS TWO TIMES  DAILY  . VENTOLIN HFA 108 (90 Base) MCG/ACT inhaler USE 2 PUFFS EVERY 6 HOURS AS NEEDED FOR SHORTNESS OF BREATH AND WHEEZING.  . vitamin B-12 (CYANOCOBALAMIN) 1000 MCG tablet Take 1,000 mcg by mouth daily.  . [DISCONTINUED] losartan (COZAAR) 100 MG tablet Take 1 tablet (100 mg total) by mouth daily. At bedtime  . [DISCONTINUED] losartan (COZAAR) 50 MG tablet TAKE 1 TABLET BY MOUTH 2  TIMES DAILY. TAKE WITH  100MG  TABLET.     Allergies: Allergies  Allergen Reactions  . Brimonidine Tartrate-Timolol Other (See Comments)    REACTION: systemic malaise amigen eye drop  . Penicillins Hives  . Breo Ellipta [Fluticasone Furoate-Vilanterol] Other (See Comments)    Ran blood pressure up Increased blood pressure    Social History: The patient  reports that she has never smoked. She has never used smokeless tobacco. She reports current alcohol use of about 7.0 standard drinks of alcohol per week. She reports that she does not use drugs.   Family History: The patient's family history includes Breast cancer in her mother; Heart attack in her brother; Hypertension in her father; Malignant hypertension in her father; Renal Disease in her father; Stroke in her brother.   Review of Systems: Please see the history of present illness.   All other systems are reviewed and  negative.   Physical Exam: VS:  BP 110/60   Pulse 88   Ht 5\' 3"  (1.6 m)   Wt 140 lb (63.5 kg)   LMP 04/08/1974   SpO2 98%   BMI 24.80 kg/m  .  BMI Body mass index is 24.8 kg/m.  Wt Readings from Last 3 Encounters:  06/01/19 140 lb (63.5 kg)  03/15/19 139 lb (63 kg)  02/24/19 137 lb (62.1 kg)    General: Pleasant. She looks younger than her stated age.  Cardiac: Regular rate and rhythm. Harsh outflow murmur. Trace edema moreso on the left (history of prior DVT).  Respiratory:  Lungs are clear to auscultation bilaterally with normal work of breathing.  GI: Soft and nontender.  MS: No deformity or atrophy. Gait and ROM intact.  Skin: Warm and dry. Color is normal.  Neuro:  Strength and sensation are intact and no gross focal deficits noted.  Psych: Alert, appropriate and with normal affect.   LABORATORY DATA:  EKG:  EKG is not ordered today.  Lab Results  Component Value Date   WBC 7.7 01/12/2019   HGB 13.7 01/12/2019   HCT 41.4 01/12/2019   PLT 298 01/12/2019   GLUCOSE 87 01/12/2019   CHOL 138 11/04/2017   TRIG 53 11/04/2017   HDL 70 11/04/2017   LDLCALC 58 11/04/2017   ALT 24 11/04/2017   AST 32 11/04/2017   NA 142 01/12/2019   K 4.2 01/12/2019   CL 100 01/12/2019   CREATININE 0.90 01/12/2019   BUN 19 01/12/2019   CO2 26 01/12/2019   TSH 1.05 04/30/2016   INR 1.06 06/24/2017   HGBA1C 5.7 (H) 06/25/2017     BNP (last 3 results) No results for input(s): BNP in the last 8760 hours.  ProBNP (last 3 results) Recent Labs    01/12/19 1219  PROBNP 1,334*     Other Studies Reviewed Today:  ECHO IMPRESSIONS 01/2019  1. Left ventricular ejection fraction, by visual estimation, is >75%. The  left ventricle has  hyperdynamic function. There is moderately increased  left ventricular hypertrophy.  2. Abnormal septal motion consistent with RV pacemaker.  3. Left ventricular diastolic Doppler parameters are indeterminate  pattern of LV diastolic filling.    4. Global right ventricle has mildly reduced systolic function.The right  ventricular size is normal. No increase in right ventricular wall  thickness.  5. Left atrial size was severely dilated.  6. Right atrial size was severely dilated.  7. Small pericardial effusion.  8. Moderate aortic valve annular calcification.  9. Moderate mitral annular calcification.  10. The mitral valve is normal in structure. Trace mitral valve  regurgitation.  11. The tricuspid valve is normal in structure. Tricuspid valve  regurgitation moderate.  12. The aortic valve is tricuspid Aortic valve regurgitation is trivial by  color flow Doppler. Moderate aortic valve stenosis.  13. There is Moderate calcification of the aortic valve.  14. There is Moderate thickening of the aortic valve.  15. Calcified leaflets with limited leaflet excursion. Visually appears to  minimally open.  16. The pulmonic valve was normal in structure. Pulmonic valve  regurgitation is mild by color flow Doppler.  17. The aortic root was not well visualized.  18. Moderately elevated pulmonary artery systolic pressure.  19. A pacer wire is visualized in the RA and RV.  20. The inferior vena cava is normal in size with greater than 50%  respiratory variability, suggesting right atrial pressure of 3 mmHg.   Myoview Study Highlights 12/2018    Nuclear stress EF: 58%.  No T wave inversion was noted during stress.  There was no ST segment deviation noted during stress.  This is a low risk study.  No reversible ischemia. LVEF 58% with normal wall motion. This is a low risk study.     ASSESSMENT & PLAN:     1. Worsening DOE/known moderate AS as well as COPD - she has improved with extra Lasix and chronic steroid therapy - doing better clinically. Will check BMET today. No real cardinal symptoms at this time.   2. Persistent AF - CHB with underlying PPM - followed by EP.  3. Prior AV nodal ablation  4. HTN - BP  looks good - she is on a total of 150 mg of ARB - has done this for some time - notes higher BP at home - recheck BMET today. Will follow.   5. Chronic anticoagulation - on the lower dose of Xarelto. No problems noted.   6. Chronic diastolic HF - see above. She looks better.   7. Advanced age - still seems to do pretty well.   8. Prior TIA  9. COVID-19 Education: The signs and symptoms of COVID-19 were discussed with the patient and how to seek care for testing (follow up with PCP or arrange E-visit).  The importance of social distancing, staying at home, hand hygiene and wearing a mask when out in public were discussed today. She has had both her COVID vaccines.   Current medicines are reviewed with the patient today.  The patient does not have concerns regarding medicines other than what has been noted above.  The following changes have been made:  See above.  Labs/ tests ordered today include:    Orders Placed This Encounter  Procedures  . Basic metabolic panel     Disposition:   FU with Dr. Marlou Porch in about 3 months. BMET today. I am happy to see her back as needed.     Patient is agreeable to  this plan and will call if any problems develop in the interim.   SignedTruitt Merle, NP  06/01/2019 2:15 PM  [facility] [address] [place], [place]  [address] Phone: [phone] Fax: [phone]

## 2019-06-01 ENCOUNTER — Encounter: Payer: Self-pay | Admitting: Nurse Practitioner

## 2019-06-01 ENCOUNTER — Other Ambulatory Visit: Payer: Self-pay

## 2019-06-01 ENCOUNTER — Ambulatory Visit (INDEPENDENT_AMBULATORY_CARE_PROVIDER_SITE_OTHER): Payer: Medicare Other | Admitting: Nurse Practitioner

## 2019-06-01 VITALS — BP 110/60 | HR 88 | Ht 63.0 in | Wt 140.0 lb

## 2019-06-01 DIAGNOSIS — I350 Nonrheumatic aortic (valve) stenosis: Secondary | ICD-10-CM | POA: Diagnosis not present

## 2019-06-01 DIAGNOSIS — Z7189 Other specified counseling: Secondary | ICD-10-CM | POA: Diagnosis not present

## 2019-06-01 DIAGNOSIS — R0602 Shortness of breath: Secondary | ICD-10-CM

## 2019-06-01 DIAGNOSIS — I4821 Permanent atrial fibrillation: Secondary | ICD-10-CM | POA: Diagnosis not present

## 2019-06-01 DIAGNOSIS — I10 Essential (primary) hypertension: Secondary | ICD-10-CM | POA: Diagnosis not present

## 2019-06-01 DIAGNOSIS — I5032 Chronic diastolic (congestive) heart failure: Secondary | ICD-10-CM | POA: Diagnosis not present

## 2019-06-01 MED ORDER — LOSARTAN POTASSIUM 50 MG PO TABS: 50.0000 mg | ORAL_TABLET | Freq: Every day | ORAL | 1 refills | Status: DC

## 2019-06-01 MED ORDER — LOSARTAN POTASSIUM 100 MG PO TABS: 100.0000 mg | ORAL_TABLET | Freq: Every day | ORAL | 3 refills | Status: DC

## 2019-06-01 NOTE — Patient Instructions (Addendum)
After Visit Summary:  We will be checking the following labs today - BMET  Medication Instructions:    Continue with your current medicines.   It is ok to use extra Lasix as you have been doing.    If you need a refill on your cardiac medications before your next appointment, please call your pharmacy.     Testing/Procedures To Be Arranged:  N/A  Follow-Up:   See Dr. Marlou Porch in 3 months     At [facility], you and your health needs are our priority.  As part of our continuing mission to provide you with exceptional heart care, we have created designated Provider Care Teams.  These Care Teams include your primary Cardiologist (physician) and Advanced Practice Providers (APPs -  Physician Assistants and Nurse Practitioners) who all work together to provide you with the care you need, when you need it.  Special Instructions:  . Stay safe, stay home, wash your hands for at least 20 seconds and wear a mask when out in public.  . It was good to talk with you today.    Call the [place] office at [phone] if you have any questions, problems or concerns.

## 2019-06-02 LAB — BASIC METABOLIC PANEL
BUN/Creatinine Ratio: 26 (ref 12–28)
BUN: 31 mg/dL (ref 10–36)
CO2: 23 mmol/L (ref 20–29)
Calcium: 9.7 mg/dL (ref 8.7–10.3)
Chloride: 99 mmol/L (ref 96–106)
Creatinine, Ser: 1.2 mg/dL — ABNORMAL HIGH (ref 0.57–1.00)
GFR calc Af Amer: 46 mL/min/{1.73_m2} — ABNORMAL LOW (ref >59)
GFR calc non Af Amer: 40 mL/min/{1.73_m2} — ABNORMAL LOW (ref >59)
Glucose: 196 mg/dL — ABNORMAL HIGH (ref 65–99)
Potassium: 4.4 mmol/L (ref 3.5–5.2)
Sodium: 141 mmol/L (ref 134–144)

## 2019-06-22 DIAGNOSIS — H353122 Nonexudative age-related macular degeneration, left eye, intermediate dry stage: Secondary | ICD-10-CM | POA: Diagnosis not present

## 2019-06-22 DIAGNOSIS — H35371 Puckering of macula, right eye: Secondary | ICD-10-CM | POA: Diagnosis not present

## 2019-06-22 DIAGNOSIS — Z961 Presence of intraocular lens: Secondary | ICD-10-CM | POA: Diagnosis not present

## 2019-06-22 DIAGNOSIS — H353114 Nonexudative age-related macular degeneration, right eye, advanced atrophic with subfoveal involvement: Secondary | ICD-10-CM | POA: Diagnosis not present

## 2019-06-22 DIAGNOSIS — H401131 Primary open-angle glaucoma, bilateral, mild stage: Secondary | ICD-10-CM | POA: Diagnosis not present

## 2019-06-22 DIAGNOSIS — D3132 Benign neoplasm of left choroid: Secondary | ICD-10-CM | POA: Diagnosis not present

## 2019-06-23 ENCOUNTER — Non-Acute Institutional Stay: Payer: Medicare Other | Admitting: Internal Medicine

## 2019-06-23 ENCOUNTER — Encounter: Payer: Self-pay | Admitting: Internal Medicine

## 2019-06-23 ENCOUNTER — Other Ambulatory Visit: Payer: Self-pay

## 2019-06-23 VITALS — BP 118/72 | HR 91 | Temp 97.7°F | Wt 135.0 lb

## 2019-06-23 DIAGNOSIS — K5901 Slow transit constipation: Secondary | ICD-10-CM | POA: Diagnosis not present

## 2019-06-23 DIAGNOSIS — J302 Other seasonal allergic rhinitis: Secondary | ICD-10-CM | POA: Diagnosis not present

## 2019-06-23 DIAGNOSIS — J449 Chronic obstructive pulmonary disease, unspecified: Secondary | ICD-10-CM

## 2019-06-23 DIAGNOSIS — I5032 Chronic diastolic (congestive) heart failure: Secondary | ICD-10-CM

## 2019-06-23 DIAGNOSIS — H353130 Nonexudative age-related macular degeneration, bilateral, stage unspecified: Secondary | ICD-10-CM | POA: Diagnosis not present

## 2019-06-23 DIAGNOSIS — I825Z3 Chronic embolism and thrombosis of unspecified deep veins of distal lower extremity, bilateral: Secondary | ICD-10-CM

## 2019-06-23 MED ORDER — LORATADINE 10 MG PO TABS: 10.0000 mg | ORAL_TABLET | Freq: Every day | ORAL | 11 refills | Status: DC

## 2019-06-23 NOTE — Progress Notes (Signed)
Location:  [facility]up[facility] psychologist of Service:  Clinic (12)  Provider: Ilyaas Musto L. Mariea Clonts, D.O., C.M.D.  Code Status: DNR Goals of Care:  Advanced Directives 06/23/2019  Does Patient Have a Medical Advance Directive? Yes  Type of Advance Directive Out of facility DNR (pink MOST or yellow form)  Does patient want to make changes to medical advance directive? No - Patient declined  Copy of Robert Lee in Chart? -  Would patient like information on creating a medical advance directive? -  Pre-existing out of facility DNR order (yellow form or pink MOST form) Pink MOST form placed in chart (order not valid for inpatient use)     Chief Complaint  Patient presents with  . Medical Management of Chronic Issues    4 month follow up    HPI: Patient is a 86 y.o. female seen today for medical management of chronic diseases.    She feels like she's lost a lot of vision in the last week.  She saw her retina specialist yesterday and there is nothing else that can be done.  She can see forms of things and see well enough to get her meals, take care of her meds.  Eye pressures have been good.  On latanoprost drops.    Felt like she had pressure in her head and congestion.  Feels better this morning.  She is clearing her throat more than usual.  Has had a tremendous amount of phlegm the past few days.  No change in color.  She's bene on prednisone 5mg  daily which seemed to help until this.  She did sit out in the sun a few days last week.    She's been to cardiology quite a bit.    She's been constipated but is treating it successfully.  Reminded about hydration, movement, fiber and she feels like she does all of these.              She's lost 5 lbs.  Past Medical History:  Diagnosis Date  . Anemia   . Aortic stenosis    a. Echo 09/06/12 EF 55-60%, no WMA, G2DD, Ao valve sclerosis w/ mod stenosis, LA mildly dilated, PA pressure 75mmHg  . Asthmatic bronchitis   .  Complete heart block [facility])    s/p permanent pacemaker 06/27/1999 (Battery change 06/2007 and 2016).  s/p AV nodal ablation by Dr [name] 2016.  [name] COPD (chronic obstructive pulmonary disease) (HCC)    Dr. [name]  . DDD (degenerative disc disease)   . Diastolic dysfunction    a. Echo 09/06/12 EF 55-60%, no WMA, G2DD, Ao valve sclerosis w/ mod stenosis, LA mildly dilated, PA pressure 27mmHg  . Diverticulosis   . DVT (deep vein thrombosis) in pregnancy 1954   a. LLE  . Hypertension   . Hypothyroidism    on medication  . Macular degeneration    Dr. [name]  . Osteoarthrosis, unspecified whether generalized or localized, other specified sites   . PAF (paroxysmal atrial fibrillation) (HCC)    Stopped flecainide, on amiodarone but still has bouts of A FIB (mostly in mornings).   . Pure hypercholesterolemia   . Staghorn calculus    Left    Past Surgical History:  Procedure Laterality Date  . APPENDECTOMY  ~ 1941  . AV NODE ABLATION  05/10/2014  . AV NODE ABLATION N/A 05/10/2014   Procedure: AV NODE ABLATION;  Surgeon: Thompson Grayer, MD;  Location: [facility] CATH LAB;  Service: Cardiovascular;  Laterality: N/A;  . BUNIONECTOMY WITH HAMMERTOE RECONSTRUCTION Bilateral ~ 1990  . CARDIAC PACEMAKER PLACEMENT  06/27/99   Medtronic PM implanted by Dr [name]  . CARDIOVERSION N/A 12/02/2013   Procedure: CARDIOVERSION;  Surgeon: [name], MD;  Location: [facility] ENDOSCOPY;  Service: Cardiovascular;  Laterality: N/A;  . CATARACT EXTRACTION W/ INTRAOCULAR LENS  IMPLANT, BILATERAL Bilateral   . COLONOSCOPY WITH PROPOFOL N/A 04/22/2013   Procedure: COLONOSCOPY WITH PROPOFOL;  Surgeon: [name], MD;  Location: [facility] ENDOSCOPY;  Service: Endoscopy;  Laterality: N/A;  . DILATION AND CURETTAGE OF UTERUS  X 2   "when I was going thru menopause"  . ESOPHAGOGASTRODUODENOSCOPY (EGD) WITH PROPOFOL N/A 04/22/2013   Procedure: ESOPHAGOGASTRODUODENOSCOPY (EGD) WITH PROPOFOL;  Surgeon: [name], MD;  Location: [facility] ENDOSCOPY;   Service: Endoscopy;  Laterality: N/A;  . HERNIA REPAIR    . INCISIONAL HERNIA REPAIR    . INSERT / REPLACE / REMOVE PACEMAKER  06/2007   "took out the old; put in new"  . INSERT / REPLACE / REMOVE PACEMAKER  05/10/2014   MDT PPM generator change by Dr [name]  . JOINT REPLACEMENT    . PARTIAL NEPHRECTOMY Left 05/1974   stone disease  . PERMANENT PACEMAKER GENERATOR CHANGE N/A 05/10/2014   Procedure: PERMANENT PACEMAKER GENERATOR CHANGE;  Surgeon: Thompson Grayer, MD;  Location: [facility] CATH LAB;  Service: Cardiovascular;  Laterality: N/A;  . TONSILLECTOMY AND ADENOIDECTOMY  1930's  . TOTAL KNEE ARTHROPLASTY Right 2001    Allergies  Allergen Reactions  . Brimonidine Tartrate-Timolol Other (See Comments)    REACTION: systemic malaise amigen eye drop  . Penicillins Hives  . Breo Ellipta [Fluticasone Furoate-Vilanterol] Other (See Comments)    Ran blood pressure up Increased blood pressure    Outpatient Encounter Medications as of 06/23/2019  Medication Sig  . amLODipine (NORVASC) 5 MG tablet TAKE 1 TABLET BY MOUTH  DAILY  . atorvastatin (LIPITOR) 40 MG tablet TAKE 1 TABLET BY MOUTH ONCE DAILY FOR CHOLESTEROL  . azelastine (ASTELIN) 0.1 % nasal spray USE 1 TO 2 SPRAYS INTO BOTH NOSTRILS TWICE DAILY.  . benzonatate (TESSALON) 200 MG capsule TAKE 1 CAPSULE BY MOUTH THREE TIMES DAILY AS NEEDED FOR COUGH.  . Biotin 5000 MCG CAPS Take 5,000 mg by mouth daily.   . Cholecalciferol (VITAMIN D) 2000 units CAPS Take 1 capsule by mouth daily.  . fluticasone (FLONASE) 50 MCG/ACT nasal spray USE 2 SPRAYS EACH NOSTRIL ONCE A DAY AS NEEDED FOR ALLERGIES OR CONGESTION.  . furosemide (LASIX) 40 MG tablet TAKE 1 TABLET BY MOUTH  DAILY  . gabapentin (NEURONTIN) 100 MG capsule TAKE 1 CAPSULE BY MOUTH AT  BEDTIME  . latanoprost (XALATAN) 0.005 % ophthalmic solution Place 1 drop into both eyes at bedtime.  [name] levothyroxine (SYNTHROID) 88 MCG tablet TAKE 1 TABLET BY MOUTH  DAILY BEFORE BREAKFAST  . losartan (COZAAR) 100  MG tablet Take 1 tablet (100 mg total) by mouth at bedtime. At bedtime  . losartan (COZAAR) 50 MG tablet Take 1 tablet (50 mg total) by mouth daily at 12 noon.  . LUTEIN PO Take 25 mg by mouth daily.   . Magnesium 250 MG TABS Take 1 tablet by mouth daily.  . metoprolol succinate (TOPROL-XL) 50 MG 24 hr tablet TAKE 1 TABLET BY MOUTH IN  THE EVENING  . montelukast (SINGULAIR) 10 MG tablet TAKE 1 TABLET BY MOUTH  DAILY  . Multiple Vitamins-Minerals (PRESERVISION AREDS 2) CAPS Take 1 capsule by mouth daily.  [name]  potassium chloride SA (K-DUR) 20 MEQ tablet TAKE 1 TABLET BY MOUTH TWICE DAILY.  [name] predniSONE (DELTASONE) 5 MG tablet Take 1 tablet (5 mg total) by mouth daily with breakfast.  . Rivaroxaban (XARELTO) 15 MG TABS tablet TAKE 1 TABLET BY MOUTH  DAILY WITH SUPPER  . SYMBICORT 160-4.5 MCG/ACT inhaler INHALE 2 PUFFS BY MOUTH  INTO THE LUNGS TWO TIMES  DAILY  . VENTOLIN HFA 108 (90 Base) MCG/ACT inhaler USE 2 PUFFS EVERY 6 HOURS AS NEEDED FOR SHORTNESS OF BREATH AND WHEEZING.  . vitamin B-12 (CYANOCOBALAMIN) 1000 MCG tablet Take 1,000 mcg by mouth daily.   No facility-administered encounter medications on file as of 06/23/2019.    Review of Systems:  Review of Systems  Constitutional: Positive for malaise/fatigue and weight loss. Negative for chills and fever.       Down 5 lbs since last visit  HENT: Positive for congestion and hearing loss. Negative for sore throat.   Eyes: Positive for blurred vision.  Respiratory: Positive for cough, sputum production, shortness of breath and wheezing.   Cardiovascular: Negative for chest pain, palpitations and leg swelling.  Gastrointestinal: Positive for constipation. Negative for abdominal pain, blood in stool, diarrhea and melena.  Genitourinary: Negative for dysuria.  Musculoskeletal: Negative for falls.  Skin: Negative for itching and rash.  Neurological: Negative for dizziness and loss of consciousness.       Chronic unsteady gait, has to move  slowly during positional changes, turning  Endo/Heme/Allergies: Bruises/bleeds easily.  Psychiatric/Behavioral: Negative for depression and memory loss. The patient is not nervous/anxious and does not have insomnia.        Coping well with loss of her husband (mentions how he was in snf for a long time prior which created some separation and she'd lost him as she knew him long before due to his dementia/aphasia)    Health Maintenance  Topic Date Due  . DEXA SCAN  Never done  . TETANUS/TDAP  02/23/2029  . INFLUENZA VACCINE  Completed  . PNA vac Low Risk Adult  Completed    Physical Exam: Vitals:   06/23/19 1124  BP: 118/72  Pulse: 91  Temp: 97.7 F (36.5 C)  TempSrc: Temporal  SpO2: 97%  Weight: 135 lb (61.2 kg)   Body mass index is 23.91 kg/m. Physical Exam  Labs reviewed: Basic Metabolic Panel: Recent Labs    01/12/19 1219 06/01/19 1418  NA 142 141  K 4.2 4.4  CL 100 99  CO2 26 23  GLUCOSE 87 196*  BUN 19 31  CREATININE 0.90 1.20*  CALCIUM 9.9 9.7   Liver Function Tests: No results for input(s): AST, ALT, ALKPHOS, BILITOT, PROT, ALBUMIN in the last 8760 hours. No results for input(s): LIPASE, AMYLASE in the last 8760 hours. No results for input(s): AMMONIA in the last 8760 hours. CBC: Recent Labs    01/12/19 1219  WBC 7.7  HGB 13.7  HCT 41.4  MCV 96  PLT 298   Lipid Panel: No results for input(s): CHOL, HDL, LDLCALC, TRIG, CHOLHDL, LDLDIRECT in the last 8760 hours. Lab Results  Component Value Date   HGBA1C 5.7 (H) 06/25/2017    Procedures since last visit: No results found.  Assessment/Plan 1. Seasonal allergies - she asked if she could resume her loratadine due to her increased congestion in the past several days and I could not find a contraindication with her eye disease  - loratadine (CLARITIN) 10 MG tablet; Take 1 tablet (10 mg total) by mouth daily.  Dispense: 30 tablet; Refill: 11  2. COPD mixed type ([place]) -if sputum changes color or  breathing is affected, she is to notify me -has been stable since prednisone added   3. Chronic diastolic congestive heart failure (HCC) -diuretics recently adjusted by cardiology -stable  4. Chronic deep vein thrombosis (DVT) of distal vein of both lower extremities (HCC) -cont chronic xarelto therapy  5. Slow transit constipation -continue adequate hydration, walking, fiber, magnesium--her approaches have been effective -notably may be worse due to her increased diuretics and loratadine will not help this either so monitor carefully  6. Bilateral nonexudative age-related macular degeneration, unspecified stage -is worsening--she reported to me no problems with managing her meds or seeing to do things for herself in [place] when I asked her about this  Labs/tests ordered: no new Next appt:  09/08/2019  Kannan Proia L. Alasha Mcguinness, D.O. [facility] [address], [place] [address] Cell Phone (Mon-Fri 8am-5pm):  [phone] On Call:  [phone] & follow prompts after 5pm & weekends Office Phone:  [phone] Office Fax:  [phone]

## 2019-06-24 ENCOUNTER — Telehealth: Payer: Self-pay

## 2019-06-24 NOTE — Telephone Encounter (Signed)
Spoke with [employer].  Pt with weakness, increased sob, increased sputum production and congestion in head, no chest pain, diaphoresis, no focal neuro deficits.  Has head pressure with BP 185/100.  Was able to walk to the restroom and back.    I requested an EKG be performed and tigertexted to me ([employer] compliant texting software for [facility]) and to please give her clonidine 0.2mg  once asap.  Nira Conn will then call me back or message me directly.

## 2019-06-24 NOTE — Telephone Encounter (Addendum)
Incoming call received from [place], [place] Nurse with [facility] indicating patient with worsening weakness and stange feelings. Patient was seen by Dr.Reed yesterday. Patients current B/P is 185/100. Patient denies chest pain.  Nira Conn is concerned that patient could be having a heart attack  Dr.Reed picked up on the call

## 2019-07-02 ENCOUNTER — Other Ambulatory Visit: Payer: Self-pay

## 2019-07-02 DIAGNOSIS — J449 Chronic obstructive pulmonary disease, unspecified: Secondary | ICD-10-CM | POA: Insufficient documentation

## 2019-07-02 DIAGNOSIS — R52 Pain, unspecified: Secondary | ICD-10-CM | POA: Diagnosis not present

## 2019-07-02 DIAGNOSIS — Z96651 Presence of right artificial knee joint: Secondary | ICD-10-CM | POA: Diagnosis not present

## 2019-07-02 DIAGNOSIS — G4489 Other headache syndrome: Secondary | ICD-10-CM | POA: Insufficient documentation

## 2019-07-02 DIAGNOSIS — Z79899 Other long term (current) drug therapy: Secondary | ICD-10-CM | POA: Insufficient documentation

## 2019-07-02 DIAGNOSIS — E039 Hypothyroidism, unspecified: Secondary | ICD-10-CM | POA: Diagnosis not present

## 2019-07-02 DIAGNOSIS — R519 Headache, unspecified: Secondary | ICD-10-CM | POA: Diagnosis present

## 2019-07-02 DIAGNOSIS — N183 Chronic kidney disease, stage 3 unspecified: Secondary | ICD-10-CM | POA: Insufficient documentation

## 2019-07-02 DIAGNOSIS — I5032 Chronic diastolic (congestive) heart failure: Secondary | ICD-10-CM | POA: Diagnosis not present

## 2019-07-02 DIAGNOSIS — I10 Essential (primary) hypertension: Secondary | ICD-10-CM | POA: Diagnosis not present

## 2019-07-02 DIAGNOSIS — Z950 Presence of cardiac pacemaker: Secondary | ICD-10-CM | POA: Diagnosis not present

## 2019-07-02 DIAGNOSIS — I130 Hypertensive heart and chronic kidney disease with heart failure and stage 1 through stage 4 chronic kidney disease, or unspecified chronic kidney disease: Secondary | ICD-10-CM | POA: Diagnosis not present

## 2019-07-03 ENCOUNTER — Emergency Department (HOSPITAL_COMMUNITY)
Admission: EM | Admit: 2019-07-03 | Discharge: 2019-07-03 | Disposition: A | Discharge status: Home / Self Care | Payer: Medicare Other | Attending: Emergency Medicine | Admitting: Emergency Medicine

## 2019-07-03 ENCOUNTER — Encounter (HOSPITAL_COMMUNITY): Payer: Self-pay | Admitting: Emergency Medicine

## 2019-07-03 ENCOUNTER — Emergency Department (HOSPITAL_COMMUNITY): Payer: Medicare Other

## 2019-07-03 ENCOUNTER — Other Ambulatory Visit: Payer: Self-pay

## 2019-07-03 DIAGNOSIS — I10 Essential (primary) hypertension: Secondary | ICD-10-CM

## 2019-07-03 DIAGNOSIS — G4489 Other headache syndrome: Secondary | ICD-10-CM

## 2019-07-03 DIAGNOSIS — R519 Headache, unspecified: Secondary | ICD-10-CM | POA: Diagnosis not present

## 2019-07-03 LAB — BASIC METABOLIC PANEL
Anion gap: 11 (ref 5–15)
BUN: 33 mg/dL — ABNORMAL HIGH (ref 8–23)
CO2: 29 mmol/L (ref 22–32)
Calcium: 9.8 mg/dL (ref 8.9–10.3)
Chloride: 101 mmol/L (ref 98–111)
Creatinine, Ser: 1 mg/dL (ref 0.44–1.00)
GFR calc Af Amer: 57 mL/min — ABNORMAL LOW (ref >60)
GFR calc non Af Amer: 49 mL/min — ABNORMAL LOW (ref >60)
Glucose, Bld: 113 mg/dL — ABNORMAL HIGH (ref 70–99)
Potassium: 3.9 mmol/L (ref 3.5–5.1)
Sodium: 141 mmol/L (ref 135–145)

## 2019-07-03 LAB — CBC WITH DIFFERENTIAL/PLATELET
Abs Immature Granulocytes: 0.04 10*3/uL (ref 0.00–0.07)
Basophils Absolute: 0.1 10*3/uL (ref 0.0–0.1)
Basophils Relative: 1 %
Eosinophils Absolute: 0.1 10*3/uL (ref 0.0–0.5)
Eosinophils Relative: 1 %
HCT: 43.6 % (ref 36.0–46.0)
Hemoglobin: 13.9 g/dL (ref 12.0–15.0)
Immature Granulocytes: 1 %
Lymphocytes Relative: 20 %
Lymphs Abs: 1.3 10*3/uL (ref 0.7–4.0)
MCH: 31.7 pg (ref 26.0–34.0)
MCHC: 31.9 g/dL (ref 30.0–36.0)
MCV: 99.3 fL (ref 80.0–100.0)
Monocytes Absolute: 0.7 10*3/uL (ref 0.1–1.0)
Monocytes Relative: 11 %
Neutro Abs: 4.5 10*3/uL (ref 1.7–7.7)
Neutrophils Relative %: 66 %
Platelets: 268 10*3/uL (ref 150–400)
RBC: 4.39 MIL/uL (ref 3.87–5.11)
RDW: 13.6 % (ref 11.5–15.5)
WBC: 6.7 10*3/uL (ref 4.0–10.5)
nRBC: 0 % (ref 0.0–0.2)

## 2019-07-03 NOTE — ED Notes (Signed)
Pt transported to CT ?

## 2019-07-03 NOTE — ED Notes (Signed)
Pt ambulated to RR with 1 assist. Pt's gait is unsteady.

## 2019-07-03 NOTE — ED Notes (Signed)
Called pts daughter cheryl who will be picking up pt for transfer back to [facility]

## 2019-07-03 NOTE — ED Provider Notes (Signed)
[facility] DEPT Provider Note   CSN: [id number] Arrival date & time: 07/02/19  2354     History Chief Complaint  Patient presents with  . Hypertension  . Headache    pressure    Jody Blake is a 86 y.o. female.   Hypertension This is a chronic problem. The problem has been gradually improving. Associated symptoms include headaches. Pertinent negatives include no chest pain and no shortness of breath. Nothing aggravates the symptoms. Nothing relieves the symptoms.  Headache Pain location:  Generalized Quality: Pressure. Timing:  Constant Progression:  Improving Chronicity:  New Associated symptoms: no fever and no weakness   Patient with history of aortic stenosis, complete heart block with pacemaker in place, COPD paroxysmal A. fib, DVT on anticoagulation presents from nursing home for hypertension and headache.  She reports her blood pressure has been elevated recently and it causes head pressure.  No fevers or vomiting.  No new vision changes.  No focal weakness.  She reports there has been some changes in her blood pressure medicines that may have caused this issue.  She is now feeling improved.     Past Medical History:  Diagnosis Date  . Anemia   . Aortic stenosis    a. Echo 09/06/12 EF 55-60%, no WMA, G2DD, Ao valve sclerosis w/ mod stenosis, LA mildly dilated, PA pressure 51mmHg  . Asthmatic bronchitis   . Complete heart block [facility])    s/p permanent pacemaker 06/27/1999 (Battery change 06/2007 and 2016).  s/p AV nodal ablation by Dr [name] 2016.  [name] COPD (chronic obstructive pulmonary disease) (HCC)    Dr. [name]  . DDD (degenerative disc disease)   . Diastolic dysfunction    a. Echo 09/06/12 EF 55-60%, no WMA, G2DD, Ao valve sclerosis w/ mod stenosis, LA mildly dilated, PA pressure 59mmHg  . Diverticulosis   . DVT (deep vein thrombosis) in pregnancy 1954   a. LLE  . Hypertension   . Hypothyroidism    on medication  . Macular  degeneration    Dr. [name]  . Osteoarthrosis, unspecified whether generalized or localized, other specified sites   . PAF (paroxysmal atrial fibrillation) (HCC)    Stopped flecainide, on amiodarone but still has bouts of A FIB (mostly in mornings).   . Pure hypercholesterolemia   . Staghorn calculus    Left    Patient Active Problem List   Diagnosis Date Noted  . Acute embolism and thrombosis of deep vein of lower extremity ([place]) 03/18/2018  . Osteoarthritis of right glenohumeral joint 08/22/2017  . Macular pucker, right eye 07/08/2017  . Primary open angle glaucoma of both eyes, mild stage 07/08/2017  . TIA (transient ischemic attack) 06/24/2017  . Hypercoagulable state due to atrial fibrillation ([place]) 03/05/2017  . Ataxia 11/14/2016  . DVT (deep vein thrombosis) in pregnancy 11/14/2016  . CKD (chronic kidney disease), stage III 11/14/2016  . Chronic diastolic congestive heart failure ([place]) 11/14/2016  . Hypertensive urgency 11/14/2016  . Hypothyroidism 11/08/2015  . Age-related macular degeneration, dry, both eyes 09/05/2015  . Bilateral ocular hypertension 09/05/2015  . Pseudophakia of both eyes 09/05/2015  . Pleural effusion on left 06/17/2015  . Complete heart block ([place]) 06/22/2014  . Sick sinus syndrome ([place]) 04/29/2014  . Tachycardia-bradycardia syndrome ([place]) 01/06/2013  . Cardiac pacemaker in situ 09/07/2012  . Long term (current) use of anticoagulants 09/07/2012  . Orthostatic hypotension 09/07/2012  . Syncope 09/06/2012  . Choroidal nevus of left eye 07/26/2011  . COPD mixed  type ([place]) 04/26/2010  . Seasonal and perennial allergic rhinitis 10/27/2008  . Essential hypertension 04/19/2008  . Permanent atrial fibrillation ([place]) 04/19/2008  . GERD 10/29/2007  . APPENDECTOMY, HX OF 04/06/2007    Past Surgical History:  Procedure Laterality Date  . APPENDECTOMY  ~ 1941  . AV NODE ABLATION  05/10/2014  . AV NODE ABLATION N/A 05/10/2014   Procedure: AV NODE ABLATION;   Surgeon: Thompson Grayer, MD;  Location: [facility] CATH LAB;  Service: Cardiovascular;  Laterality: N/A;  . BUNIONECTOMY WITH HAMMERTOE RECONSTRUCTION Bilateral ~ 1990  . CARDIAC PACEMAKER PLACEMENT  06/27/99   Medtronic PM implanted by Dr [name]  . CARDIOVERSION N/A 12/02/2013   Procedure: CARDIOVERSION;  Surgeon: [name], MD;  Location: [facility] ENDOSCOPY;  Service: Cardiovascular;  Laterality: N/A;  . CATARACT EXTRACTION W/ INTRAOCULAR LENS  IMPLANT, BILATERAL Bilateral   . COLONOSCOPY WITH PROPOFOL N/A 04/22/2013   Procedure: COLONOSCOPY WITH PROPOFOL;  Surgeon: [name], MD;  Location: [facility] ENDOSCOPY;  Service: Endoscopy;  Laterality: N/A;  . DILATION AND CURETTAGE OF UTERUS  X 2   "when I was going thru menopause"  . ESOPHAGOGASTRODUODENOSCOPY (EGD) WITH PROPOFOL N/A 04/22/2013   Procedure: ESOPHAGOGASTRODUODENOSCOPY (EGD) WITH PROPOFOL;  Surgeon: [name], MD;  Location: [facility] ENDOSCOPY;  Service: Endoscopy;  Laterality: N/A;  . HERNIA REPAIR    . INCISIONAL HERNIA REPAIR    . INSERT / REPLACE / REMOVE PACEMAKER  06/2007   "took out the old; put in new"  . INSERT / REPLACE / REMOVE PACEMAKER  05/10/2014   MDT PPM generator change by Dr [name]  . JOINT REPLACEMENT    . PARTIAL NEPHRECTOMY Left 05/1974   stone disease  . PERMANENT PACEMAKER GENERATOR CHANGE N/A 05/10/2014   Procedure: PERMANENT PACEMAKER GENERATOR CHANGE;  Surgeon: Thompson Grayer, MD;  Location: [facility] CATH LAB;  Service: Cardiovascular;  Laterality: N/A;  . TONSILLECTOMY AND ADENOIDECTOMY  1930's  . TOTAL KNEE ARTHROPLASTY Right 2001     OB History    Gravida  2   Para  2   Term      Preterm      AB      Living  2     SAB      TAB      Ectopic      Multiple      Live Births              Family History  Problem Relation Age of Onset  . Malignant hypertension Father   . Hypertension Father   . Renal Disease Father   . Breast cancer Mother   . Heart attack Brother   . Stroke Brother     Social  History   Tobacco Use  . Smoking status: Never Smoker  . Smokeless tobacco: Never Used  Substance Use Topics  . Alcohol use: Yes    Alcohol/week: 7.0 standard drinks    Types: 7 Glasses of wine per week    Comment: occasionally  . Drug use: No    Home Medications Prior to Admission medications   Medication Sig Start Date End Date Taking? Authorizing Provider  amLODipine (NORVASC) 5 MG tablet TAKE 1 TABLET BY MOUTH  DAILY 09/24/18   Reed, Tiffany L, DO  atorvastatin (LIPITOR) 40 MG tablet TAKE 1 TABLET BY MOUTH ONCE DAILY FOR CHOLESTEROL 02/15/19   Reed, Tiffany L, DO  azelastine (ASTELIN) 0.1 % nasal spray USE 1 TO 2 SPRAYS INTO BOTH NOSTRILS TWICE DAILY. 12/07/18  Young, Tarri Fuller D, MD  benzonatate (TESSALON) 200 MG capsule TAKE 1 CAPSULE BY MOUTH THREE TIMES DAILY AS NEEDED FOR COUGH. 03/21/19   Baird Lyons D, MD  Biotin 5000 MCG CAPS Take 5,000 mg by mouth daily.     [provider]  Cholecalciferol (VITAMIN D) 2000 units CAPS Take 1 capsule by mouth daily.    [provider]  fluticasone (FLONASE) 50 MCG/ACT nasal spray USE 2 SPRAYS EACH NOSTRIL ONCE A DAY AS NEEDED FOR ALLERGIES OR CONGESTION. 12/07/18   Baird Lyons D, MD  furosemide (LASIX) 40 MG tablet TAKE 1 TABLET BY MOUTH  DAILY 02/15/19   Reed, Tiffany L, DO  gabapentin (NEURONTIN) 100 MG capsule TAKE 1 CAPSULE BY MOUTH AT  BEDTIME 09/24/18   Reed, Tiffany L, DO  latanoprost (XALATAN) 0.005 % ophthalmic solution Place 1 drop into both eyes at bedtime.    [provider]  levothyroxine (SYNTHROID) 88 MCG tablet TAKE 1 TABLET BY MOUTH  DAILY BEFORE BREAKFAST 03/29/19   Reed, Tiffany L, DO  loratadine (CLARITIN) 10 MG tablet Take 1 tablet (10 mg total) by mouth daily. 06/23/19   Reed, Tiffany L, DO  losartan (COZAAR) 100 MG tablet Take 1 tablet (100 mg total) by mouth at bedtime. At bedtime 06/01/19   Burtis Junes, NP  losartan (COZAAR) 50 MG tablet Take 1 tablet (50 mg total) by mouth daily at 12  noon. 06/01/19   Burtis Junes, NP  LUTEIN PO Take 25 mg by mouth daily.     [provider]  Magnesium 250 MG TABS Take 1 tablet by mouth daily.    [provider]  metoprolol succinate (TOPROL-XL) 50 MG 24 hr tablet TAKE 1 TABLET BY MOUTH IN  THE EVENING 02/08/19   Allred, Jeneen Rinks, MD  montelukast (SINGULAIR) 10 MG tablet TAKE 1 TABLET BY MOUTH  DAILY 02/17/19   Deneise Lever, MD  Multiple Vitamins-Minerals (PRESERVISION AREDS 2) CAPS Take 1 capsule by mouth daily.    [provider]  potassium chloride SA (K-DUR) 20 MEQ tablet TAKE 1 TABLET BY MOUTH TWICE DAILY. 08/10/18   Jerline Pain, MD  predniSONE (DELTASONE) 5 MG tablet Take 1 tablet (5 mg total) by mouth daily with breakfast. 05/13/19   Deneise Lever, MD  Rivaroxaban (XARELTO) 15 MG TABS tablet TAKE 1 TABLET BY MOUTH  DAILY WITH SUPPER 05/10/19   Jerline Pain, MD  SYMBICORT 160-4.5 MCG/ACT inhaler INHALE 2 PUFFS BY MOUTH  INTO THE LUNGS TWO TIMES  DAILY 02/17/19   Deneise Lever, MD  VENTOLIN HFA 108 (90 Base) MCG/ACT inhaler USE 2 PUFFS EVERY 6 HOURS AS NEEDED FOR SHORTNESS OF BREATH AND WHEEZING. 03/02/19   Deneise Lever, MD  vitamin B-12 (CYANOCOBALAMIN) 1000 MCG tablet Take 1,000 mcg by mouth daily.    [provider]    Allergies    Brimonidine tartrate-timolol, Penicillins, and Breo ellipta [fluticasone furoate-vilanterol]  Review of Systems   Review of Systems  Constitutional: Negative for fever.  Respiratory: Negative for shortness of breath.   Cardiovascular: Negative for chest pain.  Neurological: Positive for headaches. Negative for weakness.  All other systems reviewed and are negative.   Physical Exam Updated Vital Signs BP (!) 172/92 (BP Location: Left Arm)   Pulse 69   Temp 97.9 F (36.6 C) (Oral)   Resp 15   LMP 04/08/1974   SpO2 98% Comment: RA  Physical Exam CONSTITUTIONAL: Elderly but appears much younger than stated age  HEAD: Normocephalic/atraumatic EYES:  EOMI/PERRL ENMT: Mucous membranes moist NECK: supple no meningeal signs SPINE/BACK:entire spine nontender CV: [id number] noted, systolic ejection murmur noted LUNGS: Lungs are clear to auscultation bilaterally, no apparent distress ABDOMEN: soft, nontender NEURO: Pt is awake/alert/appropriate, moves all extremitiesx4.  No facial droop.  No arm or leg drift EXTREMITIES: pulses normal/equal, full ROM SKIN: warm, color normal PSYCH: no abnormalities of mood noted, alert and oriented to situation  ED Results / Procedures / Treatments   Labs (all labs ordered are listed, but only abnormal results are displayed) Labs Reviewed  BASIC METABOLIC PANEL - Abnormal; Notable for the following components:      Result Value   Glucose, Bld 113 (*)    BUN 33 (*)    GFR calc non Af Amer 49 (*)    GFR calc Af Amer 57 (*)    All other components within normal limits  CBC WITH DIFFERENTIAL/PLATELET    EKG EKG Interpretation  Date/Time:  Saturday July 03 2019 00:14:58 EDT Ventricular Rate:  67 PR Interval:    QRS Duration: 180 QT Interval:  490 QTC Calculation: 518 R Axis:   -79 Text Interpretation: Sinus rhythm Short PR interval IVCD, consider atypical RBBB Left ventricular hypertrophy Confirmed by Ripley Fraise [phone]) on 07/03/2019 12:51:04 AM   Radiology CT Head Wo Contrast  Result Date: 07/03/2019 CLINICAL DATA:  86 year old with acute headache. Normal neuro exam. EXAM: CT HEAD WITHOUT CONTRAST TECHNIQUE: Contiguous axial images were obtained from the base of the skull through the vertex without intravenous contrast. COMPARISON:  06/24/2017 FINDINGS: Brain: No intracranial hemorrhage, mass effect, or midline shift. Normal for age atrophy. Moderate to advanced chronic small vessel ischemia, unchanged from prior. No hydrocephalus. The basilar cisterns are patent. No evidence of territorial infarct or acute ischemia. No extra-axial or intracranial fluid collection. Vascular: Atherosclerosis of  skullbase vasculature without hyperdense vessel or abnormal calcification. Skull: No fracture or focal lesion. Sinuses/Orbits: Chronic opacification of lower right mastoid air cells. Paranasal sinuses are clear. Bilateral cataract resection. Other: None. IMPRESSION: 1. No acute intracranial abnormality. 2. Unchanged atrophy and chronic small vessel ischemia. Electronically Signed   By: Keith Rake M.D.   On: 07/03/2019 01:16    Procedures Procedures   Medications Ordered in ED Medications - No data to display  ED Course  I have reviewed the triage vital signs and the nursing notes.  Pertinent labs & imaging results that were available during my care of the patient were reviewed by me and considered in my medical decision making (see chart for details).    MDM Rules/Calculators/A&P                      Patient well-appearing.  No focal weakness.  She reports she feels that her blood pressure is getting better.  CT head done due to age and on anticoagulation.  CT head negative.  Patient otherwise stable for discharge.  I attempted to call the nursing facility to get collateral information but there was no response Final Clinical Impression(s) / ED Diagnoses Final diagnoses:  Other headache syndrome  Essential hypertension    Rx / DC Orders ED Discharge Orders    None       Ripley Fraise, MD 07/03/19 [phone]

## 2019-07-03 NOTE — ED Notes (Signed)
Pt returned from CT °

## 2019-07-03 NOTE — ED Triage Notes (Signed)
Patient BIB [facility] from [facility] for HTN. Pt started having pressure in her head around 1700. Facility checked BP and wanted her to be evaluated bc she has been hypertensive 4 times this week. EMS reports negative stroke screen, GCS 15. Pt reports  Stopping Metoprolol x2 weeks but has been back on it x 7days.

## 2019-07-07 ENCOUNTER — Other Ambulatory Visit: Payer: Self-pay

## 2019-07-07 ENCOUNTER — Non-Acute Institutional Stay: Payer: Medicare Other | Admitting: Internal Medicine

## 2019-07-07 ENCOUNTER — Encounter: Payer: Self-pay | Admitting: Internal Medicine

## 2019-07-07 VITALS — BP 148/92 | HR 72 | Temp 98.1°F | Ht 63.0 in | Wt 136.0 lb

## 2019-07-07 DIAGNOSIS — M722 Plantar fascial fibromatosis: Secondary | ICD-10-CM

## 2019-07-07 DIAGNOSIS — I10 Essential (primary) hypertension: Secondary | ICD-10-CM | POA: Diagnosis not present

## 2019-07-07 MED ORDER — LOSARTAN POTASSIUM 100 MG PO TABS: 100.0000 mg | ORAL_TABLET | Freq: Every day | ORAL | 11 refills | Status: DC

## 2019-07-07 NOTE — Progress Notes (Signed)
Location:  [facility] of Service:  Clinic (12)  Provider: Chrystina Naff L. Mariea Clonts, D.O., C.M.D.  Code Status: DNR Goals of Care:  Advanced Directives 07/07/2019  Does Patient Have a Medical Advance Directive? Yes  Type of Advance Directive Out of facility DNR (pink MOST or yellow form)  Does patient want to make changes to medical advance directive? No - Patient declined  Copy of [place] in Chart? -  Would patient like information on creating a medical advance directive? -  Pre-existing out of facility DNR order (yellow form or pink MOST form) Pink MOST/Yellow Form most recent copy in chart - Physician notified to receive inpatient order     Chief Complaint  Patient presents with  . Acute Visit    Blood pressure seems to increase in the afternoon and go back to normal when she takes her meds.     HPI: Patient is a 86 y.o. female seen today for acute visit due to high blood pressures in the afternoon.  Reviewed notes from multiple visits, ekg, and ED visit.  Discussed plan with clinic nurse.    Cinda had failed to put her afternoon metoprolol into her pillbox for a two week period.  BPs were running very high to where she wound up seeing the nurse on 3 different occasions with BPs over 170.  She was aware of them with pressure in her head.  She got clonidine 0.2mg  on three occasions with improvement.  The third time, on call sent her to the ED where nothing was really done and bp improved gradually.  She tells me that her AM BP is [id number] systolic after furosemide and amlodipine Yesterday 144/stg after those, she says. 179/110 yesterday afternoon--took evening meds and it came back down.    Her bp went down to 80/54 after the two clonidine last Saturday when her bp had been so very high (200/110).  She was weak and shaky after that.   Her congestion in her sinuses/head has resolved.  she did not even take the claritin (probably  best with all this going on).     Past Medical History:  Diagnosis Date  . Anemia   . Aortic stenosis    a. Echo 09/06/12 EF 55-60%, no WMA, G2DD, Ao valve sclerosis w/ mod stenosis, LA mildly dilated, PA pressure 56mmHg  . Asthmatic bronchitis   . Complete heart block [facility])    s/p permanent pacemaker 06/27/1999 (Battery change 06/2007 and 2016).  s/p AV nodal ablation by Dr [name] 2016.  [name] COPD (chronic obstructive pulmonary disease) (HCC)    Dr. [name]  . DDD (degenerative disc disease)   . Diastolic dysfunction    a. Echo 09/06/12 EF 55-60%, no WMA, G2DD, Ao valve sclerosis w/ mod stenosis, LA mildly dilated, PA pressure 68mmHg  . Diverticulosis   . DVT (deep vein thrombosis) in pregnancy 1954   a. LLE  . Hypertension   . Hypothyroidism    on medication  . Macular degeneration    Dr. [name]  . Osteoarthrosis, unspecified whether generalized or localized, other specified sites   . PAF (paroxysmal atrial fibrillation) (HCC)    Stopped flecainide, on amiodarone but still has bouts of A FIB (mostly in mornings).   . Pure hypercholesterolemia   . Staghorn calculus    Left    Past Surgical History:  Procedure Laterality Date  . APPENDECTOMY  ~ 1941  . AV NODE ABLATION  05/10/2014  . AV NODE  ABLATION N/A 05/10/2014   Procedure: AV NODE ABLATION;  Surgeon: Thompson Grayer, MD;  Location: [facility] CATH LAB;  Service: Cardiovascular;  Laterality: N/A;  . BUNIONECTOMY WITH HAMMERTOE RECONSTRUCTION Bilateral ~ 1990  . CARDIAC PACEMAKER PLACEMENT  06/27/99   Medtronic PM implanted by Dr [name]  . CARDIOVERSION N/A 12/02/2013   Procedure: CARDIOVERSION;  Surgeon: [name], MD;  Location: [facility] ENDOSCOPY;  Service: Cardiovascular;  Laterality: N/A;  . CATARACT EXTRACTION W/ INTRAOCULAR LENS  IMPLANT, BILATERAL Bilateral   . COLONOSCOPY WITH PROPOFOL N/A 04/22/2013   Procedure: COLONOSCOPY WITH PROPOFOL;  Surgeon: [name], MD;  Location: [facility] ENDOSCOPY;  Service: Endoscopy;  Laterality: N/A;  .  DILATION AND CURETTAGE OF UTERUS  X 2   "when I was going thru menopause"  . ESOPHAGOGASTRODUODENOSCOPY (EGD) WITH PROPOFOL N/A 04/22/2013   Procedure: ESOPHAGOGASTRODUODENOSCOPY (EGD) WITH PROPOFOL;  Surgeon: [name], MD;  Location: [facility] ENDOSCOPY;  Service: Endoscopy;  Laterality: N/A;  . HERNIA REPAIR    . INCISIONAL HERNIA REPAIR    . INSERT / REPLACE / REMOVE PACEMAKER  06/2007   "took out the old; put in new"  . INSERT / REPLACE / REMOVE PACEMAKER  05/10/2014   MDT PPM generator change by Dr [name]  . JOINT REPLACEMENT    . PARTIAL NEPHRECTOMY Left 05/1974   stone disease  . PERMANENT PACEMAKER GENERATOR CHANGE N/A 05/10/2014   Procedure: PERMANENT PACEMAKER GENERATOR CHANGE;  Surgeon: Thompson Grayer, MD;  Location: [facility] C[facility] LAB;  Service: Cardiovascular;  Laterality: N/A;  . TONSILLECTOMY AND ADENOIDECTOMY  1930's  . TOTAL KNEE ARTHROPLASTY Right 2001    Allergies  Allergen Reactions  . Brimonidine Tartrate-Timolol Other (See Comments)    REACTION: systemic malaise amigen eye drop  . Penicillins Hives  . Breo Ellipta [Fluticasone Furoate-Vilanterol] Other (See Comments)    Ran blood pressure up Increased blood pressure    Outpatient Encounter Medications as of 07/07/2019  Medication Sig  . amLODipine (NORVASC) 5 MG tablet TAKE 1 TABLET BY MOUTH  DAILY  . atorvastatin (LIPITOR) 40 MG tablet TAKE 1 TABLET BY MOUTH ONCE DAILY FOR CHOLESTEROL  . azelastine (ASTELIN) 0.1 % nasal spray USE 1 TO 2 SPRAYS INTO BOTH NOSTRILS TWICE DAILY.  . benzonatate (TESSALON) 200 MG capsule TAKE 1 CAPSULE BY MOUTH THREE TIMES DAILY AS NEEDED FOR COUGH.  . Cholecalciferol (VITAMIN D) 2000 units CAPS Take 1 capsule by mouth daily.  . fluticasone (FLONASE) 50 MCG/ACT nasal spray USE 2 SPRAYS EACH NOSTRIL ONCE A DAY AS NEEDED FOR ALLERGIES OR CONGESTION.  . furosemide (LASIX) 40 MG tablet TAKE 1 TABLET BY MOUTH  DAILY  . gabapentin (NEURONTIN) 100 MG capsule TAKE 1 CAPSULE BY MOUTH AT  BEDTIME  .  latanoprost (XALATAN) 0.005 % ophthalmic solution Place 1 drop into both eyes at bedtime.  [name] levothyroxine (SYNTHROID) 88 MCG tablet TAKE 1 TABLET BY MOUTH  DAILY BEFORE BREAKFAST  . loratadine (CLARITIN) 10 MG tablet Take 1 tablet (10 mg total) by mouth daily.  [name] losartan (COZAAR) 100 MG tablet Take 1 tablet (100 mg total) by mouth at bedtime. At bedtime  . losartan (COZAAR) 50 MG tablet Take 1 tablet (50 mg total) by mouth daily at 12 noon.  . LUTEIN PO Take 25 mg by mouth daily.   . Magnesium 250 MG TABS Take 1 tablet by mouth daily.  . metoprolol succinate (TOPROL-XL) 50 MG 24 hr tablet TAKE 1 TABLET BY MOUTH IN  THE EVENING  . montelukast (SINGULAIR)  10 MG tablet TAKE 1 TABLET BY MOUTH  DAILY  . Multiple Vitamins-Minerals (PRESERVISION AREDS 2) CAPS Take 1 capsule by mouth daily.  . potassium chloride SA (K-DUR) 20 MEQ tablet TAKE 1 TABLET BY MOUTH TWICE DAILY.  [name] predniSONE (DELTASONE) 5 MG tablet Take 1 tablet (5 mg total) by mouth daily with breakfast.  . Rivaroxaban (XARELTO) 15 MG TABS tablet TAKE 1 TABLET BY MOUTH  DAILY WITH SUPPER  . SYMBICORT 160-4.5 MCG/ACT inhaler INHALE 2 PUFFS BY MOUTH  INTO THE LUNGS TWO TIMES  DAILY  . VENTOLIN HFA 108 (90 Base) MCG/ACT inhaler USE 2 PUFFS EVERY 6 HOURS AS NEEDED FOR SHORTNESS OF BREATH AND WHEEZING.  . vitamin B-12 (CYANOCOBALAMIN) 1000 MCG tablet Take 1,000 mcg by mouth daily.  . [DISCONTINUED] Biotin 5000 MCG CAPS Take 5,000 mg by mouth daily.    No facility-administered encounter medications on file as of 07/07/2019.    Review of Systems:  Review of Systems  Constitutional: Negative for chills and fever.  HENT: Positive for hearing loss. Negative for congestion and sore throat.        Congestions and shortness of breath improved  Eyes: Positive for blurred vision.  Respiratory: Positive for cough. Negative for sputum production, shortness of breath and wheezing.   Cardiovascular: Negative for chest pain, palpitations and leg  swelling.       Wearing her compression hose  Gastrointestinal: Negative for abdominal pain.  Genitourinary: Negative for dysuria.  Musculoskeletal: Negative for falls.       Had some soreness of bottom arch area of right foot when awoken this am, but it resolved after she put shoes on and walked around  Neurological: Positive for dizziness and headaches. Negative for loss of consciousness.       Had headache with high bps recently (not today) and dizziness when dropped low after clonidine during hypertensive urgency  Endo/Heme/Allergies: Bruises/bleeds easily.  Psychiatric/Behavioral: Negative for depression and memory loss. The patient is not nervous/anxious and does not have insomnia.     Health Maintenance  Topic Date Due  . DEXA SCAN  Never done  . TETANUS/TDAP  02/23/2029  . INFLUENZA VACCINE  Completed  . PNA vac Low Risk Adult  Completed    Physical Exam: Vitals:   07/07/19 1428  BP: (!) 148/92  Pulse: 72  Temp: 98.1 F (36.7 C)  TempSrc: Temporal  SpO2: 96%  Weight: 136 lb (61.7 kg)  Height: 5\' 3"  (1.6 m)   Body mass index is 24.09 kg/m. Physical Exam Vitals reviewed.  Constitutional:      Appearance: Normal appearance.  Cardiovascular:     Rate and Rhythm: Normal rate and regular rhythm.  Pulmonary:     Effort: Pulmonary effort is normal.     Breath sounds: Normal breath sounds.  Abdominal:     General: Bowel sounds are normal.  Musculoskeletal:     Right lower leg: No edema.     Left lower leg: No edema.     Comments: Compression hose in place; gait unsteady  Skin:    General: Skin is warm and dry.  Neurological:     General: No focal deficit present.     Mental Status: She is alert and oriented to person, place, and time.  Psychiatric:        Mood and Affect: Mood normal.        Behavior: Behavior normal.        Thought Content: Thought content normal.  Judgment: Judgment normal.     Labs reviewed: Basic Metabolic Panel: Recent Labs     01/12/19 1219 06/01/19 1418 07/03/19 0039  NA 142 141 141  K 4.2 4.4 3.9  CL 100 99 101  CO2 26 23 29   GLUCOSE 87 196* 113*  BUN 19 31 33*  CREATININE 0.90 1.20* 1.00  CALCIUM 9.9 9.7 9.8   Liver Function Tests: No results for input(s): AST, ALT, ALKPHOS, BILITOT, PROT, ALBUMIN in the last 8760 hours. No results for input(s): LIPASE, AMYLASE in the last 8760 hours. No results for input(s): AMMONIA in the last 8760 hours. CBC: Recent Labs    01/12/19 1219 07/03/19 0039  WBC 7.7 6.7  NEUTROABS  --  4.5  HGB 13.7 13.9  HCT 41.4 43.6  MCV 96 99.3  PLT 298 268   Lipid Panel: No results for input(s): CHOL, HDL, LDLCALC, TRIG, CHOLHDL, LDLDIRECT in the last 8760 hours. Lab Results  Component Value Date   HGBA1C 5.7 (H) 06/25/2017    Procedures since last visit: CT Head Wo Contrast  Result Date: 07/03/2019 CLINICAL DATA:  86 year old with acute headache. Normal neuro exam. EXAM: CT HEAD WITHOUT CONTRAST TECHNIQUE: Contiguous axial images were obtained from the base of the skull through the vertex without intravenous contrast. COMPARISON:  06/24/2017 FINDINGS: Brain: No intracranial hemorrhage, mass effect, or midline shift. Normal for age atrophy. Moderate to advanced chronic small vessel ischemia, unchanged from prior. No hydrocephalus. The basilar cisterns are patent. No evidence of territorial infarct or acute ischemia. No extra-axial or intracranial fluid collection. Vascular: Atherosclerosis of skullbase vasculature without hyperdense vessel or abnormal calcification. Skull: No fracture or focal lesion. Sinuses/Orbits: Chronic opacification of lower right mastoid air cells. Paranasal sinuses are clear. Bilateral cataract resection. Other: None. IMPRESSION: 1. No acute intracranial abnormality. 2. Unchanged atrophy and chronic small vessel ischemia. Electronically Signed   By: Keith Rake M.D.   On: 07/03/2019 01:16    Assessment/Plan 1. Essential hypertension Increase  noon losartan to 100mg  also Continue amlodipine and lasix in am and metoprolol and losartan in evening as currently taken Keep f/u with Dr. Marlou Porch in about 2 weeks--it is an afternoon appt so it should help determine if this is an effective change meds to be done with clinic nurses due to med error (i've been concerned due to her declining vision not her cognition--it is excellent)  2.  Plantar fasciitis of right foot -encouraged stretches of feet prior to getting up and walking in the morning  -could also use tennis ball beneath if this is a persistent problem  Labs/tests ordered:  No new Next appt:  09/08/2019--keep as scheduled  Mohamadou Maciver L. Haidyn Chadderdon, D.O. [facility] [address], [place] [address] Cell Phone (Mon-Fri 8am-5pm):  [phone] On Call:  [phone] & follow prompts after 5pm & weekends Office Phone:  [phone] Office Fax:  [phone]

## 2019-07-16 ENCOUNTER — Encounter: Payer: Self-pay | Admitting: Cardiology

## 2019-07-16 ENCOUNTER — Ambulatory Visit (INDEPENDENT_AMBULATORY_CARE_PROVIDER_SITE_OTHER): Payer: Medicare Other | Admitting: Cardiology

## 2019-07-16 ENCOUNTER — Other Ambulatory Visit: Payer: Self-pay

## 2019-07-16 VITALS — BP 140/90 | HR 90 | Ht 63.0 in | Wt 133.0 lb

## 2019-07-16 DIAGNOSIS — I10 Essential (primary) hypertension: Secondary | ICD-10-CM | POA: Diagnosis not present

## 2019-07-16 DIAGNOSIS — I5032 Chronic diastolic (congestive) heart failure: Secondary | ICD-10-CM | POA: Diagnosis not present

## 2019-07-16 DIAGNOSIS — I350 Nonrheumatic aortic (valve) stenosis: Secondary | ICD-10-CM | POA: Diagnosis not present

## 2019-07-16 MED ORDER — METOPROLOL SUCCINATE ER 100 MG PO TB24: 100.0000 mg | ORAL_TABLET | Freq: Every evening | ORAL | 3 refills | Status: DC

## 2019-07-16 NOTE — Progress Notes (Signed)
Cardiology Office Note:    Date:  07/16/2019   ID:  Jody Blake, DOB [birth year], MRN [id number]  PCP:  Jody Curry, DO  Cardiologist:  Jody Furbish, MD  Electrophysiologist:  None   Referring MD: Jody Curry, DO     History of Present Illness:    Jody Blake is a 86 y.o. female here for follow-up of elevated blood pressure difficult to control, pacemaker, complete heart block. - Had AV nodal ablation secondary to atrial fibrillation.  Could not control with antiarrhythmics.  I take care of her son as well.  -She acknowledged that she recently messed up her medications.  Was having some trouble with her eyesight.  She was skipping her metoprolol.  Her blood pressures have been erratic although for quite some time.  Difficult to control.  They had given her some clonidine at one time.  This dropped her down to the 80s she felt woozy.  1 time they went and sent her to [facility] long hospital because of this.  Overall Jody Blake has increased her losartan to 100 mg twice daily.  We have added back her metoprolol and this seems to have stabilized her, improved.    Past Medical History:  Diagnosis Date  . Anemia   . Aortic stenosis    a. Echo 09/06/12 EF 55-60%, no WMA, G2DD, Ao valve sclerosis w/ mod stenosis, LA mildly dilated, PA pressure 18mmHg  . Asthmatic bronchitis   . Complete heart block [facility])    s/p permanent pacemaker 06/27/1999 (Battery change 06/2007 and 2016).  s/p AV nodal ablation by Dr Jody Blake 2016.  [name] COPD (chronic obstructive pulmonary disease) (HCC)    Dr. Annamaria Blake  . DDD (degenerative disc disease)   . Diastolic dysfunction    a. Echo 09/06/12 EF 55-60%, no WMA, G2DD, Ao valve sclerosis w/ mod stenosis, LA mildly dilated, PA pressure 75mmHg  . Diverticulosis   . DVT (deep vein thrombosis) in pregnancy 1954   a. LLE  . Hypertension   . Hypothyroidism    on medication  . Macular degeneration    Dr. Eliezer Blake  . Osteoarthrosis, unspecified whether generalized or  localized, other specified sites   . PAF (paroxysmal atrial fibrillation) (HCC)    Stopped flecainide, on amiodarone but still has bouts of A FIB (mostly in mornings).   . Pure hypercholesterolemia   . Staghorn calculus    Left    Past Surgical History:  Procedure Laterality Date  . APPENDECTOMY  ~ 1941  . AV NODE ABLATION  05/10/2014  . AV NODE ABLATION N/A 05/10/2014   Procedure: AV NODE ABLATION;  Surgeon: Jody Grayer, MD;  Location: [facility] CATH LAB;  Service: Cardiovascular;  Laterality: N/A;  . BUNIONECTOMY WITH HAMMERTOE RECONSTRUCTION Bilateral ~ 1990  . CARDIAC PACEMAKER PLACEMENT  06/27/99   Medtronic PM implanted by Dr Jody Blake  . CARDIOVERSION N/A 12/02/2013   Procedure: CARDIOVERSION;  Surgeon: Jody Records, MD;  Location: [facility] ENDOSCOPY;  Service: Cardiovascular;  Laterality: N/A;  . CATARACT EXTRACTION W/ INTRAOCULAR LENS  IMPLANT, BILATERAL Bilateral   . COLONOSCOPY WITH PROPOFOL N/A 04/22/2013   Procedure: COLONOSCOPY WITH PROPOFOL;  Surgeon: Jody Fair, MD;  Location: [facility] ENDOSCOPY;  Service: Endoscopy;  Laterality: N/A;  . DILATION AND CURETTAGE OF UTERUS  X 2   "when I was going thru menopause"  . ESOPHAGOGASTRODUODENOSCOPY (EGD) WITH PROPOFOL N/A 04/22/2013   Procedure: ESOPHAGOGASTRODUODENOSCOPY (EGD) WITH PROPOFOL;  Surgeon: Jody Fair, MD;  Location: [facility] ENDOSCOPY;  Service: Endoscopy;  Laterality: N/A;  . HERNIA REPAIR    . INCISIONAL HERNIA REPAIR    . INSERT / REPLACE / REMOVE PACEMAKER  06/2007   "took out the old; put in new"  . INSERT / REPLACE / REMOVE PACEMAKER  05/10/2014   MDT PPM generator change by Dr Jody Blake  . JOINT REPLACEMENT    . PARTIAL NEPHRECTOMY Left 05/1974   stone disease  . PERMANENT PACEMAKER GENERATOR CHANGE N/A 05/10/2014   Procedure: PERMANENT PACEMAKER GENERATOR CHANGE;  Surgeon: Jody Grayer, MD;  Location: [facility] CATH LAB;  Service: Cardiovascular;  Laterality: N/A;  . TONSILLECTOMY AND ADENOIDECTOMY  1930's  . TOTAL KNEE ARTHROPLASTY  Right 2001    Current Medications: Current Meds  Medication Sig  . amLODipine (NORVASC) 5 MG tablet TAKE 1 TABLET BY MOUTH  DAILY  . atorvastatin (LIPITOR) 40 MG tablet TAKE 1 TABLET BY MOUTH ONCE DAILY FOR CHOLESTEROL  . azelastine (ASTELIN) 0.1 % nasal spray USE 1 TO 2 SPRAYS INTO BOTH NOSTRILS TWICE DAILY.  . benzonatate (TESSALON) 200 MG capsule TAKE 1 CAPSULE BY MOUTH THREE TIMES DAILY AS NEEDED FOR COUGH.  . Cholecalciferol (VITAMIN D) 2000 units CAPS Take 1 capsule by mouth daily.  . fluticasone (FLONASE) 50 MCG/ACT nasal spray USE 2 SPRAYS EACH NOSTRIL ONCE A DAY AS NEEDED FOR ALLERGIES OR CONGESTION.  . furosemide (LASIX) 40 MG tablet TAKE 1 TABLET BY MOUTH  DAILY  . gabapentin (NEURONTIN) 100 MG capsule TAKE 1 CAPSULE BY MOUTH AT  BEDTIME  . latanoprost (XALATAN) 0.005 % ophthalmic solution Place 1 drop into both eyes at bedtime.  [name] levothyroxine (SYNTHROID) 88 MCG tablet TAKE 1 TABLET BY MOUTH  DAILY BEFORE BREAKFAST  . losartan (COZAAR) 100 MG tablet Take 1 tablet (100 mg total) by mouth at bedtime. At bedtime  . losartan (COZAAR) 100 MG tablet Take 1 tablet (100 mg total) by mouth daily at 12 noon.  . LUTEIN PO Take 25 mg by mouth daily.   . Magnesium 250 MG TABS Take 1 tablet by mouth daily.  . montelukast (SINGULAIR) 10 MG tablet TAKE 1 TABLET BY MOUTH  DAILY  . Multiple Vitamins-Minerals (PRESERVISION AREDS 2) CAPS Take 1 capsule by mouth daily.  . potassium chloride SA (K-DUR) 20 MEQ tablet TAKE 1 TABLET BY MOUTH TWICE DAILY.  [name] predniSONE (DELTASONE) 5 MG tablet Take 1 tablet (5 mg total) by mouth daily with breakfast.  . Rivaroxaban (XARELTO) 15 MG TABS tablet TAKE 1 TABLET BY MOUTH  DAILY WITH SUPPER  . SYMBICORT 160-4.5 MCG/ACT inhaler INHALE 2 PUFFS BY MOUTH  INTO THE LUNGS TWO TIMES  DAILY  . VENTOLIN HFA 108 (90 Base) MCG/ACT inhaler USE 2 PUFFS EVERY 6 HOURS AS NEEDED FOR SHORTNESS OF BREATH AND WHEEZING.  . vitamin B-12 (CYANOCOBALAMIN) 1000 MCG tablet Take 1,000  mcg by mouth daily.  . [DISCONTINUED] metoprolol succinate (TOPROL-XL) 50 MG 24 hr tablet TAKE 1 TABLET BY MOUTH IN  THE EVENING     Allergies:   Brimonidine tartrate-timolol, Penicillins, and Breo ellipta [fluticasone furoate-vilanterol]   Social History   Socioeconomic History  . Marital status: Widowed    Spouse name: Not on file  . Number of children: 2  . Years of education: Not on file  . Highest education level: Not on file  Occupational History  . Occupation: RETIRED    Employer: RETIRED    Comment: Family [employer] business  Tobacco Use  . Smoking status: Never Smoker  . Smokeless tobacco: Never  Used  Substance and Sexual Activity  . Alcohol use: Yes    Alcohol/week: 7.0 standard drinks    Types: 7 Glasses of wine per week    Comment: occasionally  . Drug use: No  . Sexual activity: Not Currently    Partners: Male  Other Topics Concern  . Not on file  Social History Narrative   Diet? Low salt, low fat      Do you drink/eat things with caffeine? yes      Marital status?                 married                   What year were you married? 1949      Do you live in a house, apartment, assisted living, condo, trailer, etc.? apartment      Is it one or more stories? 3 stories      How many persons live in your home? Just me      Do you have any pets in your home? (please list) no      Current or past profession: accounting      Do you exercise?          yes                            Type & how often? Walk, class, prescribed daily      Do you have a living will? yes      Do you have a DNR form?     yes                             If not, do you want to discuss one?      Do you have signed POA/HPOA for forms? no         Social Determinants of Health   Financial Resource Strain:   . Difficulty of Paying Living Expenses:   Food Insecurity:   . Worried About Charity fundraiser in the Last Year:   . Arboriculturist in the Last Year:   Transportation Needs:    . Film/video editor (Medical):   [name] Lack of Transportation (Non-Medical):   Physical Activity:   . Days of Exercise per Week:   . Minutes of Exercise per Session:   Stress:   . Feeling of Stress :   Social Connections:   . Frequency of Communication with Friends and Family:   . Frequency of Social Gatherings with Friends and Family:   . Attends Religious Services:   . Active Member of Clubs or Organizations:   . Attends Archivist Meetings:   [name] Marital Status:      Family History: The patient's family history includes Breast cancer in her mother; Heart attack in her brother; Hypertension in her father; Malignant hypertension in her father; Renal Disease in her father; Stroke in her brother.   EKGs/Labs/Other Studies Reviewed:    The following studies were reviewed today: Prior office notes, echocardiogram EKG reviewed  Recent Labs: 01/12/2019: NT-Pro BNP 1,334 07/03/2019: BUN 33; Creatinine, Ser 1.00; Hemoglobin 13.9; Platelets 268; Potassium 3.9; Sodium 141  Recent Lipid Panel    Component Value Date/Time   CHOL 138 11/04/2017 0600   TRIG 53 11/04/2017 0600   HDL 70 11/04/2017 0600   CHOLHDL 3.1 06/25/2017 0229  VLDL 11 06/25/2017 0229   LDLCALC 58 11/04/2017 0600    Physical Exam:    VS:  BP 140/90   Pulse 90   Ht 5\' 3"  (1.6 m)   Wt 133 lb (60.3 kg)   LMP 04/08/1974   SpO2 98%   BMI 23.56 kg/m     Wt Readings from Last 3 Encounters:  07/16/19 133 lb (60.3 kg)  07/07/19 136 lb (61.7 kg)  06/23/19 135 lb (61.2 kg)    GEN: Well nourished, well developed, in no acute distress  HEENT: normal  Neck: no JVD, carotid bruits, or masses Cardiac: RRR; 2/6 systolic murmur right upper sternal border, no rubs, or gallops, minor ankle edema bilaterally Respiratory:  clear to auscultation bilaterally, normal work of breathing GI: soft, nontender, nondistended, + BS MS: no deformity or atrophy  Skin: warm and dry, no rash Neuro:  Alert and Oriented x 3,  Strength and sensation are intact Psych: euthymic mood, full affect   ASSESSMENT:    1. Aortic valve stenosis, etiology of cardiac valve disease unspecified   2. Essential hypertension   3. Chronic diastolic heart failure (HCC)    PLAN:    In order of problems listed above:  Permanent atrial fibrillation with pacemaker placement after complete heart block following AV nodal ablation -No changes. -Dr. Rayann Blake has been monitoring  Chronic anticoagulation - Xarelto reduced dose 15 mg.  No bleeding.    Essential hypertension, difficult to control -Jody Blake has made the change to losartan 100 at lunch and 100 at bedtime.  Lets continue with that since it seems to be helping stabilize her blood pressure.  Also, I will increase her Toprol to 100 mg at bedtime.  She has a pacemaker for backup.  History of TIA x2.  Moderate aortic valve stenosis -Continue to monitor, heard on exam.   Medication Adjustments/Labs and Tests Ordered: Current medicines are reviewed at length with the patient today.  Concerns regarding medicines are outlined above.  No orders of the defined types were placed in this encounter.  Meds ordered this encounter  Medications  . metoprolol succinate (TOPROL-XL) 100 MG 24 hr tablet    Sig: Take 1 tablet (100 mg total) by mouth every evening. Take with or immediately following a meal.    Dispense:  90 tablet    Refill:  3    Patient Instructions  Medication Instructions:  Please increase your Metoprolol to 100 mg every evening. Continue all other medications as listed.  *If you need a refill on your cardiac medications before your next appointment, please call your pharmacy*  Follow-Up: At [facility], you and your health needs are our priority.  As part of our continuing mission to provide you with exceptional heart care, we have created designated Provider Care Teams.  These Care Teams include your primary Cardiologist (physician) and Advanced Practice  Providers (APPs -  Physician Assistants and Nurse Practitioners) who all work together to provide you with the care you need, when you need it.  We recommend signing up for the patient portal called "MyChart".  Sign up information is provided on this After Visit Summary.  MyChart is used to connect with patients for Virtual Visits (Telemedicine).  Patients are able to view lab/test results, encounter notes, upcoming appointments, etc.  Non-urgent messages can be sent to your provider as well.   To learn more about what you can do with MyChart, go to [email or web address].    Your next appointment:   2 month(s)  The format for your next appointment:   In Person  Provider:   Truitt Merle, NP   Thank you for choosing [facility]!!        Signed, Jody Furbish, MD  07/16/2019 5:40 PM    [place]

## 2019-07-16 NOTE — Patient Instructions (Signed)
Medication Instructions:  Please increase your Metoprolol to 100 mg every evening. Continue all other medications as listed.  *If you need a refill on your cardiac medications before your next appointment, please call your pharmacy*  Follow-Up: At [facility], you and your health needs are our priority.  As part of our continuing mission to provide you with exceptional heart care, we have created designated Provider Care Teams.  These Care Teams include your primary Cardiologist (physician) and Advanced Practice Providers (APPs -  Physician Assistants and Nurse Practitioners) who all work together to provide you with the care you need, when you need it.  We recommend signing up for the patient portal called "MyChart".  Sign up information is provided on this After Visit Summary.  MyChart is used to connect with patients for Virtual Visits (Telemedicine).  Patients are able to view lab/test results, encounter notes, upcoming appointments, etc.  Non-urgent messages can be sent to your provider as well.   To learn more about what you can do with MyChart, go to [email or web address].    Your next appointment:   2 month(s)  The format for your next appointment:   In Person  Provider:   Truitt Merle, NP   Thank you for choosing [facility]!!

## 2019-08-20 ENCOUNTER — Ambulatory Visit: Payer: Medicare Other | Admitting: Cardiology

## 2019-08-30 ENCOUNTER — Ambulatory Visit (INDEPENDENT_AMBULATORY_CARE_PROVIDER_SITE_OTHER): Payer: Medicare Other | Admitting: Internal Medicine

## 2019-08-30 ENCOUNTER — Other Ambulatory Visit: Payer: Self-pay

## 2019-08-30 ENCOUNTER — Encounter: Payer: Self-pay | Admitting: Internal Medicine

## 2019-08-30 DIAGNOSIS — J449 Chronic obstructive pulmonary disease, unspecified: Secondary | ICD-10-CM

## 2019-08-30 DIAGNOSIS — I4821 Permanent atrial fibrillation: Secondary | ICD-10-CM

## 2019-08-30 NOTE — Progress Notes (Deleted)
f °

## 2019-08-30 NOTE — Patient Instructions (Signed)
We can continue current meds.  I'm glad you are feeling as well as you are. If anything changes where we can help, please let us know.

## 2019-08-30 NOTE — Progress Notes (Signed)
Subjective:    Patient ID: Jody Blake, female    DOB: 1925/05/06, 86 y.o.   MRN: [id number]  HPI F never smoker, followed for allergic rhinitis, chronic bronchitis, complicated by GERD, Hx PAfib/ pacemaker. ------------------------------------------------------------------------------------------------   03/01/2019-  Virtual Visit via Telephone Note  I connected with Jody Blake on 03/01/19 at  2:30 PM EST by telephone and verified that I am speaking with the correct person using two identifiers.  Location: Patient: H Provider: O   I discussed the limitations, risks, security and privacy concerns of performing an evaluation and management service by telephone and the availability of in person appointments. I also discussed with the patient that there may be a patient responsible charge related to this service. The patient expressed understanding and agreed to proceed.   History of Present Illness: 86 year old female never smoker followed for Allergic rhinitis, COPD mixed type, complicated by GERD,  AFib/pacemaker/ Xarelto, Glaucoma/ macular degen, dCHF, AoStenosis, CKDIII, TIA  Widowed and grieving this Fall. Singulair, Proair hfa, Symbicort 160, flonase, astelin DOE hasn't changed much since last here. Productive cough only clear mucus. Not wheezing so she hasn't been using rescue inhaler. Discussed elevated BNP as possibly related to DOE and cough. Has cardiology f/u pending.  She thought depomedrol injection had helped breathing last visit- discussed limited trial of maintenance prednisone.   Observations/Objective: CXR 10/29/2018- No acute cardiopulmonary disease. ProBNP 1 month ago was 1,334 H   Assessment and Plan: COPD- not convinced cough and DOE are pulmonary- try pred 5 mg daily x 2 weeks. dCHF- cough and dyspnea might both reflect her elevated BNP- she will discuss with cardiology.  Follow Up Instructions: 6 months   I discussed the assessment and treatment  plan with the patient. The patient was provided an opportunity to ask questions and all were answered. The patient agreed with the plan and demonstrated an understanding of the instructions.   The patient was advised to call back or seek an in-person evaluation if the symptoms worsen or if the condition fails to improve as anticipated.  I provided 18 minutes of non-face-to-face time during this encounter. Baird Lyons, MD  -----------------------------  08/30/19- 86 year old female never smoker followed for Allergic rhinitis, COPD mixed type, complicated by GERD,  AFib/pacemaker/ Xarelto, Glaucoma/ macular degen, dCHF, AoStenosis, CKDIII, TIA Singulair, Proair hfa, Symbicort 160, flonase, astelin -----cough in mornings, SOB with activity  Walks 30-45 minutes daily with a rest stop part-way.  Considers this her "best year" in a while with no acute issues.  Had 2 Moderna Covax Adjusts diuretic per crdiology CXR 10/29/18- IMPRESSION: No acute cardiopulmonary disease.  ROS-see HPI   + = positive Constitutional:    weight loss, night sweats, fevers, chills, fatigue, lassitude. HEENT:    headaches, difficulty swallowing, tooth/dental problems, sore throat,       sneezing, itching, ear ache, nasal congestion, post nasal drip, snoring CV:    chest pain, orthopnea, PND, swelling in lower extremities, anasarca,                                                    dizziness, palpitations Resp:   shortness of breath with exertion or at rest.                +productive cough,   +non-productive cough, coughing up of blood.  change in color of mucus.  +wheezing.   Skin:    rash or lesions. GI:  No-   heartburn, indigestion, abdominal pain, nausea, vomiting,  GU:  MS:   joint pain, stiffness,  Neuro-     nothing unusual Psych:  change in mood or affect.  depression or anxiety.   memory loss.  Objective:  OBJ- Physical Exam General- Alert, Oriented, Affect-appropriate, Distress- none  acute Skin- rash-none, lesions- none, excoriation- none Lymphadenopathy- none Head- atraumatic            Eyes- Gross vision intact, PERRLA, conjunctivae and secretions clear            Ears- + Hard of hearing            Nose- Clear, no-Septal dev, mucus, polyps, erosion, perforation             Throat- Mallampati II , mucosa clear , drainage- none, tonsils- atrophic Neck- flexible , trachea midline, no stridor , thyroid nl, carotid no bruit Chest - symmetrical excursion , unlabored           Heart/CV- RRR , no murmur , no gallop  , no rub, nl s1 s2                           - JVD- none , edema+ elastic hose, stasis changes- none, varices- none           Lung-  Wheeze-none, cough-none , dullness -none, rub- none           Chest wall-+ left pacemaker.  Abd-   Br/ Gen/ Rectal- Not done, not indicated Extrem- cyanosis- none, clubbing, none, atrophy- none, strength- nl Neuro- grossly intact to observation    Assessment & Plan:

## 2019-09-08 ENCOUNTER — Other Ambulatory Visit: Payer: Self-pay

## 2019-09-08 ENCOUNTER — Encounter: Payer: Self-pay | Admitting: Internal Medicine

## 2019-09-08 ENCOUNTER — Non-Acute Institutional Stay: Payer: Medicare Other | Admitting: Internal Medicine

## 2019-09-08 VITALS — BP 118/62 | HR 82 | Temp 97.5°F | Ht 63.0 in | Wt 138.2 lb

## 2019-09-08 DIAGNOSIS — I4891 Unspecified atrial fibrillation: Secondary | ICD-10-CM

## 2019-09-08 DIAGNOSIS — I5032 Chronic diastolic (congestive) heart failure: Secondary | ICD-10-CM

## 2019-09-08 DIAGNOSIS — E2839 Other primary ovarian failure: Secondary | ICD-10-CM

## 2019-09-08 DIAGNOSIS — K120 Recurrent oral aphthae: Secondary | ICD-10-CM

## 2019-09-08 DIAGNOSIS — J449 Chronic obstructive pulmonary disease, unspecified: Secondary | ICD-10-CM | POA: Diagnosis not present

## 2019-09-08 DIAGNOSIS — D6869 Other thrombophilia: Secondary | ICD-10-CM | POA: Diagnosis not present

## 2019-09-08 DIAGNOSIS — I10 Essential (primary) hypertension: Secondary | ICD-10-CM | POA: Diagnosis not present

## 2019-09-08 NOTE — Progress Notes (Signed)
CARDIOLOGY OFFICE NOTE  Date:  09/20/2019    Jody Blake Date of Birth: 10-28-1925 Medical Record [id number]  PCP:  Gayland Curry, DO  Cardiologist:  Marisa Cyphers   Chief Complaint  Patient presents with  . Follow-up    History of Present Illness: Jody Blake is a 86 y.o. female who presents today for a 2 month check. Seen for Dr. Marlou Porch. Sees Dr. [name] for EP.   She has a history of CHB - s/p PPM - followed by Dr. [name]. Has also had AV nodal ablation secondary to AF that was not controllable with antiarrhythmics. Other issues include HTN and progressive shortness of breath.   I last saw her in 2022-06-20 - her husband had died.   Saw Dr. Marlou Porch in 08/19/22 - there had been a medicine mix up due to her worsening eye sight. She had been missing her Metoprolol - now back on and this dose was increased. She remains on higher than typical doses of ARB as well and this is chronic.  She does use extra lasix as needed chronically.   The patient does not have symptoms concerning for COVID-19 infection (fever, chills, cough, or new shortness of breath).   Comes in today. Here alone. She notes that she has had several occasions where she has had to use extra Lasix for several days - trying to keep her weight around 132#. More short of breath. No chest pain. May have some dizziness/presyncope - no frank syncope. She notes she does not feel well when she gets up in the mornings. Coughs a lot at night. Still with some swelling. No excessive salt.   Past Medical History:  Diagnosis Date  . Anemia   . Aortic stenosis    a. Echo 09/06/12 EF 55-60%, no WMA, G2DD, Ao valve sclerosis w/ mod stenosis, LA mildly dilated, PA pressure 49mmHg  . Asthmatic bronchitis   . Complete heart block [facility])    s/p permanent pacemaker 06/27/1999 (Battery change 06/2007 and 2016).  s/p AV nodal ablation by Dr [name] 2016.  [name] COPD (chronic obstructive pulmonary disease) (HCC)    Dr. [name]  . DDD  (degenerative disc disease)   . Diastolic dysfunction    a. Echo 09/06/12 EF 55-60%, no WMA, G2DD, Ao valve sclerosis w/ mod stenosis, LA mildly dilated, PA pressure 67mmHg  . Diverticulosis   . DVT (deep vein thrombosis) in pregnancy 1954   a. LLE  . Hypertension   . Hypothyroidism    on medication  . Macular degeneration    Dr. [name]  . Osteoarthrosis, unspecified whether generalized or localized, other specified sites   . PAF (paroxysmal atrial fibrillation) (HCC)    Stopped flecainide, on amiodarone but still has bouts of A FIB (mostly in mornings).   . Pure hypercholesterolemia   . Staghorn calculus    Left    Past Surgical History:  Procedure Laterality Date  . APPENDECTOMY  ~ 1941  . AV NODE ABLATION  05/10/2014  . AV NODE ABLATION N/A 05/10/2014   Procedure: AV NODE ABLATION;  Surgeon: Thompson Grayer, MD;  Location: [facility] CATH LAB;  Service: Cardiovascular;  Laterality: N/A;  . BUNIONECTOMY WITH HAMMERTOE RECONSTRUCTION Bilateral ~ 1990  . CARDIAC PACEMAKER PLACEMENT  06/27/99   Medtronic PM implanted by Dr [name]  . CARDIOVERSION N/A 12/02/2013   Procedure: CARDIOVERSION;  Surgeon: [name], MD;  Location: [place] [facility];  Service: Cardiovascular;  Laterality: N/A;  . CATARACT EXTRACTION W/  INTRAOCULAR LENS  IMPLANT, BILATERAL Bilateral   . COLONOSCOPY WITH PROPOFOL N/A 04/22/2013   Procedure: COLONOSCOPY WITH PROPOFOL;  Surgeon: [name], MD;  Location: [facility] ENDOSCOPY;  Service: Endoscopy;  Laterality: N/A;  . DILATION AND CURETTAGE OF UTERUS  X 2   "when I was going thru menopause"  . ESOPHAGOGASTRODUODENOSCOPY (EGD) WITH PROPOFOL N/A 04/22/2013   Procedure: ESOPHAGOGASTRODUODENOSCOPY (EGD) WITH PROPOFOL;  Surgeon: [name], MD;  Location: [facility] ENDOSCOPY;  Service: Endoscopy;  Laterality: N/A;  . HERNIA REPAIR    . INCISIONAL HERNIA REPAIR    . INSERT / REPLACE / REMOVE PACEMAKER  06/2007   "took out the old; put in new"  . INSERT / REPLACE / REMOVE PACEMAKER   05/10/2014   MDT PPM generator change by Dr [name]  . JOINT REPLACEMENT    . PARTIAL NEPHRECTOMY Left 05/1974   stone disease  . PERMANENT PACEMAKER GENERATOR CHANGE N/A 05/10/2014   Procedure: PERMANENT PACEMAKER GENERATOR CHANGE;  Surgeon: Thompson Grayer, MD;  Location: [facility] CATH LAB;  Service: Cardiovascular;  Laterality: N/A;  . TONSILLECTOMY AND ADENOIDECTOMY  1930's  . TOTAL KNEE ARTHROPLASTY Right 2001     Medications: Current Meds  Medication Sig  . amLODipine (NORVASC) 5 MG tablet TAKE 1 TABLET BY MOUTH  DAILY  . atorvastatin (LIPITOR) 40 MG tablet TAKE 1 TABLET BY MOUTH ONCE DAILY FOR CHOLESTEROL  . benzonatate (TESSALON) 200 MG capsule TAKE 1 CAPSULE BY MOUTH THREE TIMES DAILY AS NEEDED FOR COUGH.  . Cholecalciferol (VITAMIN D) 2000 units CAPS Take 1 capsule by mouth daily.  . fluticasone (FLONASE) 50 MCG/ACT nasal spray USE 2 SPRAYS EACH NOSTRIL ONCE A DAY AS NEEDED FOR ALLERGIES OR CONGESTION.  . furosemide (LASIX) 40 MG tablet Take 1.5 tablets (60 mg total) by mouth daily. Ok to use an extra dose of 40 mg for weight gain  . gabapentin (NEURONTIN) 100 MG capsule TAKE 1 CAPSULE BY MOUTH AT  BEDTIME  . latanoprost (XALATAN) 0.005 % ophthalmic solution Place 1 drop into both eyes at bedtime.  [name] levothyroxine (SYNTHROID) 88 MCG tablet TAKE 1 TABLET BY MOUTH  DAILY BEFORE BREAKFAST  . losartan (COZAAR) 100 MG tablet Take 1 tablet (100 mg total) by mouth at bedtime. At bedtime  . losartan (COZAAR) 100 MG tablet Take 1 tablet (100 mg total) by mouth daily at 12 noon.  [name] losartan (COZAAR) 100 MG tablet Take 100 mg by mouth 2 (two) times daily.  . LUTEIN PO Take 25 mg by mouth daily.   . Magnesium 250 MG TABS Take 1 tablet by mouth daily.  . montelukast (SINGULAIR) 10 MG tablet TAKE 1 TABLET BY MOUTH  DAILY  . Multiple Vitamins-Minerals (PRESERVISION AREDS 2) CAPS Take 1 capsule by mouth daily.  . potassium chloride SA (K-DUR) 20 MEQ tablet TAKE 1 TABLET BY MOUTH TWICE DAILY.  [name] predniSONE  (DELTASONE) 5 MG tablet Take 1 tablet (5 mg total) by mouth daily with breakfast.  . Rivaroxaban (XARELTO) 15 MG TABS tablet TAKE 1 TABLET BY MOUTH  DAILY WITH SUPPER  . SYMBICORT 160-4.5 MCG/ACT inhaler INHALE 2 PUFFS BY MOUTH  INTO THE LUNGS TWO TIMES  DAILY  . VENTOLIN HFA 108 (90 Base) MCG/ACT inhaler USE 2 PUFFS EVERY 6 HOURS AS NEEDED FOR SHORTNESS OF BREATH AND WHEEZING.  . vitamin B-12 (CYANOCOBALAMIN) 1000 MCG tablet Take 1,000 mcg by mouth daily.  . [DISCONTINUED] furosemide (LASIX) 40 MG tablet TAKE 1 TABLET BY MOUTH  DAILY  Allergies: Allergies  Allergen Reactions  . Brimonidine Tartrate-Timolol Other (See Comments)    REACTION: systemic malaise amigen eye drop  . Penicillins Hives  . Breo Ellipta [Fluticasone Furoate-Vilanterol] Other (See Comments)    Ran blood pressure up Increased blood pressure    Social History: The patient  reports that she has never smoked. She has never used smokeless tobacco. She reports current alcohol use of about 7.0 standard drinks of alcohol per week. She reports that she does not use drugs.   Family History: The patient's family history includes Breast cancer in her mother; Heart attack in her brother; Hypertension in her father; Malignant hypertension in her father; Renal Disease in her father; Stroke in her brother.   Review of Systems: Please see the history of present illness.   All other systems are reviewed and negative.   Physical Exam: VS:  BP 122/90   Pulse 83   Ht 5\' 3"  (1.6 m)   Wt 136 lb 12.8 oz (62.1 kg)   LMP 04/08/1974   SpO2 98%   BMI 24.23 kg/m  .  BMI Body mass index is 24.23 kg/m.  Wt Readings from Last 3 Encounters:  09/20/19 136 lb 12.8 oz (62.1 kg)  09/08/19 138 lb 3.2 oz (62.7 kg)  08/30/19 135 lb 12.8 oz (61.6 kg)    General: Pleasant. Alert and in no acute distress.  She weighed 133 at her last visit with [place] in April. She is hard of hearing.   Cardiac: Regular rate and rhythm. Harsh murmur of AS  noted. She has 1+ bilateral edema.  Respiratory:  Lungs are coarse.  GI: Soft and nontender.  MS: No deformity or atrophy. Gait and ROM intact.  Skin: Warm and dry. Color is normal.  Neuro:  Strength and sensation are intact and no gross focal deficits noted.  Psych: Alert, appropriate and with normal affect.   LABORATORY DATA:  EKG:  EKG is not ordered today.    Lab Results  Component Value Date   WBC 6.7 07/03/2019   HGB 13.9 07/03/2019   HCT 43.6 07/03/2019   PLT 268 07/03/2019   GLUCOSE 113 (H) 07/03/2019   CHOL 138 11/04/2017   TRIG 53 11/04/2017   HDL 70 11/04/2017   LDLCALC 58 11/04/2017   ALT 24 11/04/2017   AST 32 11/04/2017   NA 141 07/03/2019   K 3.9 07/03/2019   CL 101 07/03/2019   CREATININE 1.00 07/03/2019   BUN 33 (H) 07/03/2019   CO2 29 07/03/2019   TSH 1.05 04/30/2016   INR 1.06 06/24/2017   HGBA1C 5.7 (H) 06/25/2017     BNP (last 3 results) No results for input(s): BNP in the last 8760 hours.  ProBNP (last 3 results) Recent Labs    01/12/19 1219  PROBNP 1,334*     ECHO IMPRESSIONS 01/2019  1. Left ventricular ejection fraction, by visual estimation, is >75%. The  left ventricle has hyperdynamic function. There is moderately increased  left ventricular hypertrophy.  2. Abnormal septal motion consistent with RV pacemaker.  3. Left ventricular diastolic Doppler parameters are indeterminate  pattern of LV diastolic filling.  4. Global right ventricle has mildly reduced systolic function.The right  ventricular size is normal. No increase in right ventricular wall  thickness.  5. Left atrial size was severely dilated.  6. Right atrial size was severely dilated.  7. Small pericardial effusion.  8. Moderate aortic valve annular calcification.  9. Moderate mitral annular calcification.  10. The mitral valve  is normal in structure. Trace mitral valve  regurgitation.  11. The tricuspid valve is normal in structure. Tricuspid valve    regurgitation moderate.  12. The aortic valve is tricuspid Aortic valve regurgitation is trivial by  color flow Doppler. Moderate aortic valve stenosis.  13. There is Moderate calcification of the aortic valve.  14. There is Moderate thickening of the aortic valve.  15. Calcified leaflets with limited leaflet excursion. Visually appears to  minimally open.  16. The pulmonic valve was normal in structure. Pulmonic valve  regurgitation is mild by color flow Doppler.  17. The aortic root was not well visualized.  18. Moderately elevated pulmonary artery systolic pressure.  19. A pacer wire is visualized in the RA and RV.  20. The inferior vena cava is normal in size with greater than 50%  respiratory variability, suggesting right atrial pressure of 3 mmHg.   MyoviewStudy Highlights9/2020    Nuclear stress EF: 58%.  No T wave inversion was noted during stress.  There was no ST segment deviation noted during stress.  This is a low risk study.  No reversible ischemia. LVEF 58% with normal wall motion. This is a low risk study.     ASSESSMENT & PLAN:   1. Chronic DOE - probably multifactorial - she has COPD and moderate AS - notes having to use more diuretics - will increase her maintenance dose to 60 mg a day - still ok to use the 40 mg extra if needed. BMET today. Remains on chronic steroid therapy as well.   2. Persistent AF - CHB with underlying PPM - followed by EP.   3. Chronic anticoagulation - on the lower dose of Xarelto - no problems noted.   4. Prior AV nodal ablation  5. HTN - BP is fine on her current regimen - she is on chronic higher doses of ARB - no changes made today. She did have higher doses - now on higher dose of beta blocker which we will continue.   6. Chronic diastolic HF - see #1.   7. Prior TIA x 2  8. Known AS - may need to consider updating - will see how she does with additional diuretics.   Current medicines are reviewed with the  patient today.  The patient does not have concerns regarding medicines other than what has been noted above.  The following changes have been made:  See above.  Labs/ tests ordered today include:    Orders Placed This Encounter  Procedures  . Basic metabolic panel     Disposition:   FU with me in one month. BMET today. Increasing her lasix.   Patient is agreeable to this plan and will call if any problems develop in the interim.   SignedTruitt Merle, NP  09/20/2019 4:20 PM  [facility] [address] [place], [place]  [address] Phone: [phone] Fax: [phone]

## 2019-09-08 NOTE — Progress Notes (Signed)
Location:  Occupational psychologist of Service:  Clinic (12)  Provider: Alondra Sahni L. Mariea Clonts, D.O., C.M.D.  Code Status: DNR Goals of Care:  Advanced Directives 09/08/2019  Does Patient Have a Medical Advance Directive? Yes  Type of Advance Directive Out of facility DNR (pink MOST or yellow form)  Does patient want to make changes to medical advance directive? No - Patient declined  Copy of [place] in Chart? -  Would patient like information on creating a medical advance directive? -  Pre-existing out of facility DNR order (yellow form or pink MOST form) Pink MOST/Yellow Form most recent copy in chart - Physician notified to receive inpatient order     Chief Complaint  Patient presents with   Medical Management of Chronic Issues    3 month follow up    HPI: Patient is a 86 y.o. female seen today for medical management of chronic diseases.    Ulcers inside lower lip, left cheek and on tongue.  She does rinse well after her inhaler.  She has not reached tomato and peach season yet.    She bumped her head on the cabinet and then sort of jumped back and hit her left elbow--has a small goose egg on her right upper forehead by hairline and large purple ecchymoses on left forearm/elbow.  Dr. Marlou Porch increased her metoprolol.  BP looks better.  She does not think that it has affected her breathing any.    "I still can't hear or see."  The clinic nurses are still managing her medications--bring each Tuesday am due to her vision.    She feels much better these days.  Is back to playing some bridge with friends and going to meals which has helped.  Past Medical History:  Diagnosis Date   Anemia    Aortic stenosis    a. Echo 09/06/12 EF 55-60%, no WMA, G2DD, Ao valve sclerosis w/ mod stenosis, LA mildly dilated, PA pressure 64mmHg   Asthmatic bronchitis    Complete heart block (HCC)    s/p permanent pacemaker 06/27/1999 (Battery change 06/2007 and  2016).  s/p AV nodal ablation by Dr [name] 2016.   COPD (chronic obstructive pulmonary disease) (HCC)    Dr. [name]   DDD (degenerative disc disease)    Diastolic dysfunction    a. Echo 09/06/12 EF 55-60%, no WMA, G2DD, Ao valve sclerosis w/ mod stenosis, LA mildly dilated, PA pressure 68mmHg   Diverticulosis    DVT (deep vein thrombosis) in pregnancy 1954   a. LLE   Hypertension    Hypothyroidism    on medication   Macular degeneration    Dr. [name]   Osteoarthrosis, unspecified whether generalized or localized, other specified sites    PAF (paroxysmal atrial fibrillation) ([place])    Stopped flecainide, on amiodarone but still has bouts of A FIB (mostly in mornings).    Pure hypercholesterolemia    Staghorn calculus    Left    Past Surgical History:  Procedure Laterality Date   APPENDECTOMY  ~ 1941   AV NODE ABLATION  05/10/2014   AV NODE ABLATION N/A 05/10/2014   Procedure: AV NODE ABLATION;  Surgeon: Thompson Grayer, MD;  Location: [facility] CATH LAB;  Service: Cardiovascular;  Laterality: N/A;   BUNIONECTOMY WITH HAMMERTOE RECONSTRUCTION Bilateral ~ [place]  06/27/99   Medtronic PM implanted by Dr [name]   CARDIOVERSION N/A 12/02/2013   Procedure: CARDIOVERSION;  Surgeon: [name], MD;  Location: [facility] ENDOSCOPY;  Service: Cardiovascular;  Laterality: N/A;   CATARACT EXTRACTION W/ INTRAOCULAR LENS  IMPLANT, BILATERAL Bilateral    COLONOSCOPY WITH PROPOFOL N/A 04/22/2013   Procedure: COLONOSCOPY WITH PROPOFOL;  Surgeon: [name], MD;  Location: [facility] ENDOSCOPY;  Service: Endoscopy;  Laterality: N/A;   DILATION AND CURETTAGE OF UTERUS  X 2   "when I was going thru menopause"   ESOPHAGOGASTRODUODENOSCOPY (EGD) WITH PROPOFOL N/A 04/22/2013   Procedure: ESOPHAGOGASTRODUODENOSCOPY (EGD) WITH PROPOFOL;  Surgeon: [name], MD;  Location: [facility] ENDOSCOPY;  Service: Endoscopy;  Laterality: N/A;   HERNIA REPAIR     INCISIONAL HERNIA REPAIR       INSERT / REPLACE / REMOVE PACEMAKER  06/2007   "took out the old; put in new"   INSERT / REPLACE / REMOVE PACEMAKER  05/10/2014   MDT PPM generator change by Dr [name]   JOINT REPLACEMENT     PARTIAL NEPHRECTOMY Left 05/1974   stone disease   PERMANENT PACEMAKER GENERATOR CHANGE N/A 05/10/2014   Procedure: PERMANENT PACEMAKER GENERATOR CHANGE;  Surgeon: Thompson Grayer, MD;  Location: [facility] CATH LAB;  Service: Cardiovascular;  Laterality: N/A;   TONSILLECTOMY AND ADENOIDECTOMY  1930's   TOTAL KNEE ARTHROPLASTY Right 2001    Allergies  Allergen Reactions   Brimonidine Tartrate-Timolol Other (See Comments)    REACTION: systemic malaise amigen eye drop   Penicillins Hives   Breo Ellipta [Fluticasone Furoate-Vilanterol] Other (See Comments)    Ran blood pressure up Increased blood pressure    Outpatient Encounter Medications as of 09/08/2019  Medication Sig   amLODipine (NORVASC) 5 MG tablet TAKE 1 TABLET BY MOUTH  DAILY   atorvastatin (LIPITOR) 40 MG tablet TAKE 1 TABLET BY MOUTH ONCE DAILY FOR CHOLESTEROL   azelastine (ASTELIN) 0.1 % nasal spray USE 1 TO 2 SPRAYS INTO BOTH NOSTRILS TWICE DAILY.   benzonatate (TESSALON) 200 MG capsule TAKE 1 CAPSULE BY MOUTH THREE TIMES DAILY AS NEEDED FOR COUGH.   Cholecalciferol (VITAMIN D) 2000 units CAPS Take 1 capsule by mouth daily.   fluticasone (FLONASE) 50 MCG/ACT nasal spray USE 2 SPRAYS EACH NOSTRIL ONCE A DAY AS NEEDED FOR ALLERGIES OR CONGESTION.   furosemide (LASIX) 40 MG tablet TAKE 1 TABLET BY MOUTH  DAILY   gabapentin (NEURONTIN) 100 MG capsule TAKE 1 CAPSULE BY MOUTH AT  BEDTIME   latanoprost (XALATAN) 0.005 % ophthalmic solution Place 1 drop into both eyes at bedtime.   levothyroxine (SYNTHROID) 88 MCG tablet TAKE 1 TABLET BY MOUTH  DAILY BEFORE BREAKFAST   losartan (COZAAR) 100 MG tablet Take 1 tablet (100 mg total) by mouth at bedtime. At bedtime   losartan (COZAAR) 100 MG tablet Take 1 tablet (100 mg total) by mouth  daily at 12 noon.   LUTEIN PO Take 25 mg by mouth daily.    Magnesium 250 MG TABS Take 1 tablet by mouth daily.   metoprolol succinate (TOPROL-XL) 100 MG 24 hr tablet Take 1 tablet (100 mg total) by mouth every evening. Take with or immediately following a meal.   montelukast (SINGULAIR) 10 MG tablet TAKE 1 TABLET BY MOUTH  DAILY   Multiple Vitamins-Minerals (PRESERVISION AREDS 2) CAPS Take 1 capsule by mouth daily.   potassium chloride SA (K-DUR) 20 MEQ tablet TAKE 1 TABLET BY MOUTH TWICE DAILY.   predniSONE (DELTASONE) 5 MG tablet Take 1 tablet (5 mg total) by mouth daily with breakfast.   Rivaroxaban (XARELTO) 15 MG TABS tablet TAKE 1 TABLET BY MOUTH  DAILY WITH SUPPER   SYMBICORT 160-4.5 MCG/ACT inhaler INHALE 2 PUFFS BY MOUTH  INTO THE LUNGS TWO TIMES  DAILY   VENTOLIN HFA 108 (90 Base) MCG/ACT inhaler USE 2 PUFFS EVERY 6 HOURS AS NEEDED FOR SHORTNESS OF BREATH AND WHEEZING.   vitamin B-12 (CYANOCOBALAMIN) 1000 MCG tablet Take 1,000 mcg by mouth daily.   No facility-administered encounter medications on file as of 09/08/2019.    Review of Systems:  Review of Systems  Constitutional: Negative for chills, fever and malaise/fatigue.  HENT: Positive for hearing loss.   Eyes: Positive for blurred vision.  Respiratory: Positive for cough and sputum production. Negative for shortness of breath.        Unchanged  Cardiovascular: Negative for chest pain, palpitations, orthopnea, leg swelling and PND.  Gastrointestinal: Negative for abdominal pain, blood in stool, constipation, diarrhea and melena.  Genitourinary: Negative for dysuria.  Musculoskeletal: Negative for falls and joint pain.  Skin: Negative for itching and rash.       Ulcerations in mouth on buccal mucosa and lower inner lip  Neurological: Negative for dizziness and loss of consciousness.  Endo/Heme/Allergies: Bruises/bleeds easily.  Psychiatric/Behavioral: Negative for depression and memory loss. The patient is not  nervous/anxious and does not have insomnia.     Health Maintenance  Topic Date Due   DEXA SCAN  Never done   INFLUENZA VACCINE  11/07/2019   TETANUS/TDAP  02/23/2029   COVID-19 Vaccine  Completed   PNA vac Low Risk Adult  Completed    Physical Exam: Vitals:   09/08/19 1410  BP: 118/62  Pulse: 82  Temp: (!) 97.5 F (36.4 C)  TempSrc: Temporal  SpO2: 95%  Weight: 138 lb 3.2 oz (62.7 kg)  Height: 5\' 3"  (1.6 m)   Body mass index is 24.48 kg/m. Physical Exam Vitals reviewed.  Constitutional:      Appearance: Normal appearance.  Cardiovascular:     Rate and Rhythm: Rhythm irregular.  Pulmonary:     Effort: Pulmonary effort is normal.     Breath sounds: Normal breath sounds. No rales.     Comments: A few wheezes at the bases Abdominal:     General: Bowel sounds are normal.  Musculoskeletal:     Cervical back: Neck supple.     Right lower leg: Edema present.     Left lower leg: Edema present.  Skin:    General: Skin is warm and dry.     Capillary Refill: Capillary refill takes less than 2 seconds.     Findings: Bruising present.  Neurological:     General: No focal deficit present.     Mental Status: She is alert and oriented to person, place, and time.  Psychiatric:        Mood and Affect: Mood normal.        Behavior: Behavior normal.     Labs reviewed: Basic Metabolic Panel: Recent Labs    01/12/19 1219 06/01/19 1418 07/03/19 0039  NA 142 141 141  K 4.2 4.4 3.9  CL 100 99 101  CO2 26 23 29   GLUCOSE 87 196* 113*  BUN 19 31 33*  CREATININE 0.90 1.20* 1.00  CALCIUM 9.9 9.7 9.8   Liver Function Tests: No results for input(s): AST, ALT, ALKPHOS, BILITOT, PROT, ALBUMIN in the last 8760 hours. No results for input(s): LIPASE, AMYLASE in the last 8760 hours. No results for input(s): AMMONIA in the last 8760 hours. CBC: Recent Labs    01/12/19 1219 07/03/19 0039  WBC  7.7 6.7  NEUTROABS  --  4.5  HGB 13.7 13.9  HCT 41.4 43.6  MCV 96 99.3  PLT  298 268   Lipid Panel: No results for input(s): CHOL, HDL, LDLCALC, TRIG, CHOLHDL, LDLDIRECT in the last 8760 hours. Lab Results  Component Value Date   HGBA1C 5.7 (H) 06/25/2017    Procedures since last visit: No results found.  Assessment/Plan 1. COPD mixed type ([place]) -control improved here lately with current regimen of prednisone 5mg  daily, singulair, symbicort, and prn ventolin  2. Estrogen deficiency - due for bone density (got canceled due to covid) - DG Bone Density; Future -cont regular exercise program and vitamin D supplement  3. Chronic diastolic congestive heart failure (HCC) -seems this is controlled with occasional 4 day course of extra dose of lasix per cardiology--she is currently due to take this with a few lb weight gain and increased edema in her ankles  4. Canker sores oral -cause not clear -I do not see thrush -recommended peridex or chloraseptic for symptoms and avoiding acidic foods that may trigger (reported only having a few strawberries--less than she'd like--this season)  5. Hypercoagulable state due to atrial fibrillation ([place]) -cont xarelto for this, has easy bruising which is worse now with prednisone, but breathing seems so much better that it certainly seems worthwhile  6. Essential hypertension -bp controlled now also with increased toprol-xl to 100mg  each evening  Labs/tests ordered:  Bone density at breast center Next appt:  02/15/2020  Xzander Gilham L. Julis Haubner, D.O. [facility] [address], [place] [address] Cell Phone (Mon-Fri 8am-5pm):  [phone] On Call:  [phone] & follow prompts after 5pm & weekends Office Phone:  [phone] Office Fax:  [phone]

## 2019-09-09 ENCOUNTER — Ambulatory Visit (INDEPENDENT_AMBULATORY_CARE_PROVIDER_SITE_OTHER): Payer: Medicare Other | Admitting: *Deleted

## 2019-09-09 DIAGNOSIS — I4821 Permanent atrial fibrillation: Secondary | ICD-10-CM

## 2019-09-09 DIAGNOSIS — I495 Sick sinus syndrome: Secondary | ICD-10-CM

## 2019-09-09 LAB — CUP PACEART REMOTE DEVICE CHECK
Battery Impedance: 302 Ohm
Battery Remaining Longevity: 113 mo
Battery Voltage: 2.79 V
Brady Statistic RV Percent Paced: 100 %
Date Time Interrogation Session: 20210603125159
Implantable Lead Implant Date: 20090318
Implantable Lead Implant Date: 20090318
Implantable Lead Location: 753859
Implantable Lead Location: 753860
Implantable Lead Model: 5076
Implantable Lead Model: 5092
Implantable Pulse Generator Implant Date: 20160202
Lead Channel Impedance Value: 67 Ohm
Lead Channel Impedance Value: 692 Ohm
Lead Channel Pacing Threshold Amplitude: 1 V
Lead Channel Pacing Threshold Pulse Width: 0.4 ms
Lead Channel Setting Pacing Amplitude: 2.5 V
Lead Channel Setting Pacing Pulse Width: 0.4 ms
Lead Channel Setting Sensing Sensitivity: 4 mV

## 2019-09-14 NOTE — Progress Notes (Signed)
Remote pacemaker transmission.   

## 2019-09-20 ENCOUNTER — Other Ambulatory Visit: Payer: Self-pay

## 2019-09-20 ENCOUNTER — Ambulatory Visit (INDEPENDENT_AMBULATORY_CARE_PROVIDER_SITE_OTHER): Payer: Medicare Other | Admitting: Nurse Practitioner

## 2019-09-20 ENCOUNTER — Encounter: Payer: Self-pay | Admitting: Nurse Practitioner

## 2019-09-20 VITALS — BP 122/90 | HR 83 | Ht 63.0 in | Wt 136.8 lb

## 2019-09-20 DIAGNOSIS — I495 Sick sinus syndrome: Secondary | ICD-10-CM | POA: Diagnosis not present

## 2019-09-20 DIAGNOSIS — I10 Essential (primary) hypertension: Secondary | ICD-10-CM

## 2019-09-20 DIAGNOSIS — I4821 Permanent atrial fibrillation: Secondary | ICD-10-CM | POA: Diagnosis not present

## 2019-09-20 DIAGNOSIS — I350 Nonrheumatic aortic (valve) stenosis: Secondary | ICD-10-CM

## 2019-09-20 DIAGNOSIS — I5032 Chronic diastolic (congestive) heart failure: Secondary | ICD-10-CM | POA: Diagnosis not present

## 2019-09-20 DIAGNOSIS — Z950 Presence of cardiac pacemaker: Secondary | ICD-10-CM | POA: Diagnosis not present

## 2019-09-20 MED ORDER — FUROSEMIDE 40 MG PO TABS: 60.0000 mg | ORAL_TABLET | Freq: Every day | ORAL | 1 refills | Status: DC

## 2019-09-20 NOTE — Patient Instructions (Addendum)
After Visit Summary:  We will be checking the following labs today - BMET   Medication Instructions:    Continue with your current medicines. BUT  Lets increase the Lasix to 60 mg a day - you may still use an extra 40 mg dose if your weight goes up.    If you need a refill on your cardiac medications before your next appointment, please call your pharmacy.     Testing/Procedures To Be Arranged:  N/A  Follow-Up:   See me in 1 month    At [facility], you and your health needs are our priority.  As part of our continuing mission to provide you with exceptional heart care, we have created designated Provider Care Teams.  These Care Teams include your primary Cardiologist (physician) and Advanced Practice Providers (APPs -  Physician Assistants and Nurse Practitioners) who all work together to provide you with the care you need, when you need it.  Special Instructions:  . Stay safe, wash your hands for at least 20 seconds and wear a mask when needed.  . It was good to talk with you today.    Call the [place] office at [phone] if you have any questions, problems or concerns.

## 2019-09-21 ENCOUNTER — Telehealth: Payer: Self-pay | Admitting: Cardiology

## 2019-09-21 LAB — BASIC METABOLIC PANEL
BUN/Creatinine Ratio: 16 (ref 12–28)
BUN: 19 mg/dL (ref 10–36)
CO2: 27 mmol/L (ref 20–29)
Calcium: 9.7 mg/dL (ref 8.7–10.3)
Chloride: 99 mmol/L (ref 96–106)
Creatinine, Ser: 1.2 mg/dL — ABNORMAL HIGH (ref 0.57–1.00)
GFR calc Af Amer: 46 mL/min/{1.73_m2} — ABNORMAL LOW (ref >59)
GFR calc non Af Amer: 40 mL/min/{1.73_m2} — ABNORMAL LOW (ref >59)
Glucose: 101 mg/dL — ABNORMAL HIGH (ref 65–99)
Potassium: 4.3 mmol/L (ref 3.5–5.2)
Sodium: 140 mmol/L (ref 134–144)

## 2019-09-21 NOTE — Telephone Encounter (Signed)
Pt c/o medication issue:  1. Name of Medication: potassium chloride SA (K-DUR) 20 MEQ tablet  2. How are you currently taking this medication (dosage and times per day)? 1x daily  3. Are you having a reaction (difficulty breathing--STAT)? no  4. What is your medication issue? Heather, patient's med management nurse, states that the patient has been taking this medication 1x daily. Per patient's last OV notes, it states she is taking it 2x daily. She is calling to verify how the patient should be taking this medication.

## 2019-09-21 NOTE — Telephone Encounter (Signed)
Spoke with Nira Conn at [employer] who reports pt has been taking potassium chloride 20 MEQ only once a day for quite some time now.  Pt did see Truitt Merle, NP yesterday.  BMP was drawn, K+ 4.3 (taking 20 MEQ once daily)  Furosemide was increased to 40 mg 1 & 1/2 tab daily yesterday however.  For now Nira Conn will continue the potassium chl 20 meq daily but aware I will forward this information to [facility] for review.

## 2019-09-22 NOTE — Telephone Encounter (Signed)
S/w Heather from med management is aware of Jody Merle, NP recommendations.  Bmet added to appt notes.

## 2019-09-22 NOTE — Telephone Encounter (Signed)
Will stay on current dose of potassium. She is also on high doses of ARB which helps keep her potassium up. Will plan to recheck BMET on return visit.  Jody Blake

## 2019-10-03 NOTE — Assessment & Plan Note (Signed)
Pacemaker managed by cardiology- to continue with them as scheduled

## 2019-10-03 NOTE — Assessment & Plan Note (Signed)
Mild intermittent uncomplicated. Probably has some age-related emphysema as well. Doing very well currently. Plan- continue current meds

## 2019-10-13 DIAGNOSIS — H401132 Primary open-angle glaucoma, bilateral, moderate stage: Secondary | ICD-10-CM | POA: Diagnosis not present

## 2019-10-13 DIAGNOSIS — Z961 Presence of intraocular lens: Secondary | ICD-10-CM | POA: Diagnosis not present

## 2019-10-13 DIAGNOSIS — H52202 Unspecified astigmatism, left eye: Secondary | ICD-10-CM | POA: Diagnosis not present

## 2019-10-13 DIAGNOSIS — D3132 Benign neoplasm of left choroid: Secondary | ICD-10-CM | POA: Diagnosis not present

## 2019-10-25 ENCOUNTER — Other Ambulatory Visit: Payer: Self-pay

## 2019-10-25 ENCOUNTER — Telehealth: Payer: Self-pay | Admitting: Internal Medicine

## 2019-10-25 ENCOUNTER — Ambulatory Visit (INDEPENDENT_AMBULATORY_CARE_PROVIDER_SITE_OTHER): Payer: Medicare Other | Admitting: Internal Medicine

## 2019-10-25 ENCOUNTER — Encounter: Payer: Self-pay | Admitting: Internal Medicine

## 2019-10-25 VITALS — BP 110/70 | HR 78 | Temp 98.6°F | Ht 63.0 in | Wt 135.6 lb

## 2019-10-25 DIAGNOSIS — I5032 Chronic diastolic (congestive) heart failure: Secondary | ICD-10-CM | POA: Diagnosis not present

## 2019-10-25 DIAGNOSIS — J449 Chronic obstructive pulmonary disease, unspecified: Secondary | ICD-10-CM

## 2019-10-25 DIAGNOSIS — J441 Chronic obstructive pulmonary disease with (acute) exacerbation: Secondary | ICD-10-CM

## 2019-10-25 MED ORDER — AZITHROMYCIN 250 MG PO TABS: ORAL_TABLET | ORAL | 0 refills | Status: DC

## 2019-10-25 MED ORDER — METHYLPREDNISOLONE ACETATE 80 MG/ML IJ SUSP
80.0000 mg | Freq: Once | INTRAMUSCULAR | Status: AC
  Administered 2019-10-25: 80 mg via INTRAMUSCULAR

## 2019-10-25 NOTE — Telephone Encounter (Signed)
Patriciaann Clan is calling back. Heather phone number is [phone].

## 2019-10-25 NOTE — Telephone Encounter (Signed)
Called and spoke with [place].  Nira Conn stated that pt has had cough and wheezing which began 6 days ago. Pt stated to her that symptoms have become worse each day. Pt is trying to get mucus up and will occ get up yellow phlegm. Nira Conn said that pt sounds really wet and wheezy when listened to her.  Pt is taking the benzonatate twice daily, has been taking symbicort inhaler and all other meds as prescribed. Nira Conn believes that pt needs to be seen for an appt due to symptoms worsening.  Pt has been scheduled for an appt with CY today 7/19 at 2pm. Nothing further needed.

## 2019-10-25 NOTE — Patient Instructions (Addendum)
Order- depo 80  Dx COPD exacerbation  Script sent for Zpak antibiotic  Use you Symbicort maintenance inhaler   - inhale 2 puffs, then rinse mouth, Twice daily  Use your ProAir rescue inhaler   - inhale 2 puffs every 4-6 hours, as needed  Ok to continue using Benzonatate perles for cough  Please call as needed  Ok to keep November appointment unless you need our help sooner. If you get much worse, go to the Emergency Room

## 2019-10-25 NOTE — Progress Notes (Signed)
Subjective:    Patient ID: Jody Blake, female    DOB: [birth year], 86 y.o.   MRN: [id number]  HPI F never smoker, followed for allergic rhinitis, chronic bronchitis, complicated by GERD, Hx PAfib/ pacemaker. ------------------------------------------------------------------------------------------------  08/30/19- 86 year old female never smoker followed for Allergic rhinitis, COPD mixed type, complicated by GERD,  AFib/pacemaker/ Xarelto, Glaucoma/ macular degen, dCHF, AoStenosis, CKDIII, TIA Singulair, Proair hfa, Symbicort 160, flonase, astelin -----cough in mornings, SOB with activity  Walks 30-45 minutes daily with a rest stop part-way.  Considers this her "best year" in a while with no acute issues.  Had 2 Moderna Covax Adjusts diuretic per crdiology CXR 10/29/18- IMPRESSION: No acute cardiopulmonary disease.  10/25/19- 86 year old female never smoker followed for Allergic rhinitis, COPD mixed type, complicated by GERD,  AFib/pacemaker/ Xarelto, Glaucoma/ macular degen, dCHF, AoStenosis, CKDIII, TIA Singulair, Proair hfa, Symbicort 160, flonase, astelin ----productive cough with a lot of clear sputum, started about a week ago Acute visit- cough, wheeze and clear/ yellow sputum x 6 days UTD Moderna Covax Reports cough, no fever, began while on way to beach with family. No recognized sick exposure. Scant sputum.  Not using inhalers as instructed- education done.   ROS-see HPI   + = positive Constitutional:    weight loss, night sweats, fevers, chills, fatigue, lassitude. HEENT:    headaches, difficulty swallowing, tooth/dental problems, sore throat,       sneezing, itching, ear ache, nasal congestion, post nasal drip, snoring CV:    chest pain, orthopnea, PND, swelling in lower extremities, anasarca,                                                    dizziness, palpitations Resp:   shortness of breath with exertion or at rest.                + productive cough,   +non-productive  cough, coughing up of blood.              change in color of mucus.  +wheezing.   Skin:    rash or lesions. GI:  No-   heartburn, indigestion, abdominal pain, nausea, vomiting,  GU:  MS:   joint pain, stiffness,  Neuro-     nothing unusual Psych:  change in mood or affect.  depression or anxiety.   memory loss.  Objective:  OBJ- Physical Exam General- Alert, Oriented, Affect-appropriate, Distress- none acute Skin- rash-none, lesions- none, excoriation- none Lymphadenopathy- none Head- atraumatic            Eyes- Gross vision intact, PERRLA, conjunctivae and secretions clear            Ears- + Hard of hearing            Nose- Clear, no-Septal dev, mucus, polyps, erosion, perforation             Throat- Mallampati II , mucosa clear , drainage- none, tonsils- atrophic Neck- flexible , trachea midline, no stridor , thyroid nl, carotid no bruit Chest - symmetrical excursion , unlabored           Heart/CV- RRR , no murmur , no gallop  , no rub, nl s1 s2                           -  JVD- none , edema+ elastic hose, stasis changes- none, varices- none           Lung-  Wheeze +coarse bilateral, cough+harsh , dullness -none, rub- none           Chest wall-+ left pacemaker.  Abd-   Br/ Gen/ Rectal- Not done, not indicated Extrem- cyanosis- none, clubbing, none, atrophy- none, strength- nl Neuro- grossly intact to observation    Assessment & Plan:

## 2019-10-26 NOTE — Progress Notes (Deleted)
CARDIOLOGY OFFICE NOTE  Date:  10/26/2019    [name] Date of Birth: Oct 05, 1925 Medical Record #[id number]  PCP:  Gayland Curry, DO  Cardiologist:  Marisa Cyphers  No chief complaint on file.   History of Present Illness: Jody Blake is a 86 y.o. female who presents today for a follow up visit.  Seen for Dr. Marlou Porch. Sees Dr. [name] for EP.   She has a history of CHB - s/p PPM - followed by Dr. [name]. Has also had AV nodal ablation secondary to AF that was not controllable with antiarrhythmics. Other issues include HTN and progressive shortness of breath.   Saw Dr. Marlou Porch in April - there had been a medicine mix up due to her worsening eye sight. She had been missing her Metoprolol - now back on and this dose was increased. She remains on higher than typical doses of ARB as well and this is chronic.  She does use extra lasix as needed chronically. I saw her last month - she was using some extra Lasix more frequently. Night time cough. Still with some swelling. I increased her base dose of diuretic.   Comes in today. Here with   Past Medical History:  Diagnosis Date  . Anemia   . Aortic stenosis    a. Echo 09/06/12 EF 55-60%, no WMA, G2DD, Ao valve sclerosis w/ mod stenosis, LA mildly dilated, PA pressure 3mmHg  . Asthmatic bronchitis   . Complete heart block [facility])    s/p permanent pacemaker 06/27/1999 (Battery change 06/2007 and 2016).  s/p AV nodal ablation by Dr [name] 2016.  [name] COPD (chronic obstructive pulmonary disease) (HCC)    Dr. [name]  . DDD (degenerative disc disease)   . Diastolic dysfunction    a. Echo 09/06/12 EF 55-60%, no WMA, G2DD, Ao valve sclerosis w/ mod stenosis, LA mildly dilated, PA pressure 46mmHg  . Diverticulosis   . DVT (deep vein thrombosis) in pregnancy 1954   a. LLE  . Hypertension   . Hypothyroidism    on medication  . Macular degeneration    Dr. [name]  . Osteoarthrosis, unspecified whether generalized or localized, other  specified sites   . PAF (paroxysmal atrial fibrillation) (HCC)    Stopped flecainide, on amiodarone but still has bouts of A FIB (mostly in mornings).   . Pure hypercholesterolemia   . Staghorn calculus    Left    Past Surgical History:  Procedure Laterality Date  . APPENDECTOMY  ~ 1941  . AV NODE ABLATION  05/10/2014  . AV NODE ABLATION N/A 05/10/2014   Procedure: AV NODE ABLATION;  Surgeon: Thompson Grayer, MD;  Location: [facility] CATH LAB;  Service: Cardiovascular;  Laterality: N/A;  . BUNIONECTOMY WITH HAMMERTOE RECONSTRUCTION Bilateral ~ 1990  . CARDIAC PACEMAKER PLACEMENT  06/27/99   Medtronic PM implanted by Dr [name]  . CARDIOVERSION N/A 12/02/2013   Procedure: CARDIOVERSION;  Surgeon: [name], MD;  Location: [facility] ENDOSCOPY;  Service: Cardiovascular;  Laterality: N/A;  . CATARACT EXTRACTION W/ INTRAOCULAR LENS  IMPLANT, BILATERAL Bilateral   . COLONOSCOPY WITH PROPOFOL N/A 04/22/2013   Procedure: COLONOSCOPY WITH PROPOFOL;  Surgeon: [name], MD;  Location: [facility] ENDOSCOPY;  Service: Endoscopy;  Laterality: N/A;  . DILATION AND CURETTAGE OF UTERUS  X 2   "when I was going thru menopause"  . ESOPHAGOGASTRODUODENOSCOPY (EGD) WITH PROPOFOL N/A 04/22/2013   Procedure: ESOPHAGOGASTRODUODENOSCOPY (EGD) WITH PROPOFOL;  Surgeon: [name], MD;  Location: [facility]  ENDOSCOPY;  Service: Endoscopy;  Laterality: N/A;  . HERNIA REPAIR    . INCISIONAL HERNIA REPAIR    . INSERT / REPLACE / REMOVE PACEMAKER  06/2007   "took out the old; put in new"  . INSERT / REPLACE / REMOVE PACEMAKER  05/10/2014   MDT PPM generator change by Dr [name]  . JOINT REPLACEMENT    . PARTIAL NEPHRECTOMY Left 05/1974   stone disease  . PERMANENT PACEMAKER GENERATOR CHANGE N/A 05/10/2014   Procedure: PERMANENT PACEMAKER GENERATOR CHANGE;  Surgeon: Thompson Grayer, MD;  Location: [facility] CATH LAB;  Service: Cardiovascular;  Laterality: N/A;  . TONSILLECTOMY AND ADENOIDECTOMY  1930's  . TOTAL KNEE ARTHROPLASTY Right 2001      Medications: No outpatient medications have been marked as taking for the 11/01/19 encounter (Appointment) with Burtis Junes, NP.     Allergies: Allergies  Allergen Reactions  . Brimonidine Tartrate-Timolol Other (See Comments)    REACTION: systemic malaise amigen eye drop  . Penicillins Hives  . Breo Ellipta [Fluticasone Furoate-Vilanterol] Other (See Comments)    Ran blood pressure up Increased blood pressure    Social History: The patient  reports that she has never smoked. She has never used smokeless tobacco. She reports current alcohol use of about 7.0 standard drinks of alcohol per week. She reports that she does not use drugs.   Family History: The patient's ***family history includes Breast cancer in her mother; Heart attack in her brother; Hypertension in her father; Malignant hypertension in her father; Renal Disease in her father; Stroke in her brother.   Review of Systems: Please see the history of present illness.   All other systems are reviewed and negative.   Physical Exam: VS:  LMP 04/08/1974  .  BMI There is no height or weight on file to calculate BMI.  Wt Readings from Last 3 Encounters:  10/25/19 135 lb 9.6 oz (61.5 kg)  09/20/19 136 lb 12.8 oz (62.1 kg)  09/08/19 138 lb 3.2 oz (62.7 kg)    General: Pleasant. Well developed, well nourished and in no acute distress.   HEENT: Normal.  Neck: Supple, no JVD, carotid bruits, or masses noted.  Cardiac: ***Regular rate and rhythm. No murmurs, rubs, or gallops. No edema.  Respiratory:  Lungs are clear to auscultation bilaterally with normal work of breathing.  GI: Soft and nontender.  MS: No deformity or atrophy. Gait and ROM intact.  Skin: Warm and dry. Color is normal.  Neuro:  Strength and sensation are intact and no gross focal deficits noted.  Psych: Alert, appropriate and with normal affect.   LABORATORY DATA:  EKG:  EKG {ACTION; IS/IS SHF:[id number]} ordered today.  Personally reviewed by  me. This demonstrates ***.  Lab Results  Component Value Date   WBC 6.7 07/03/2019   HGB 13.9 07/03/2019   HCT 43.6 07/03/2019   PLT 268 07/03/2019   GLUCOSE 101 (H) 09/20/2019   CHOL 138 11/04/2017   TRIG 53 11/04/2017   HDL 70 11/04/2017   LDLCALC 58 11/04/2017   ALT 24 11/04/2017   AST 32 11/04/2017   NA 140 09/20/2019   K 4.3 09/20/2019   CL 99 09/20/2019   CREATININE 1.20 (H) 09/20/2019   BUN 19 09/20/2019   CO2 27 09/20/2019   TSH 1.05 04/30/2016   INR 1.06 06/24/2017   HGBA1C 5.7 (H) 06/25/2017     BNP (last 3 results) No results for input(s): BNP in the last 8760 hours.  ProBNP (last 3 results)  Recent Labs    01/12/19 1219  PROBNP 1,334*     Other Studies Reviewed Today:  ECHOIMPRESSIONS10/2020  1. Left ventricular ejection fraction, by visual estimation, is >75%. The  left ventricle has hyperdynamic function. There is moderately increased  left ventricular hypertrophy.  2. Abnormal septal motion consistent with RV pacemaker.  3. Left ventricular diastolic Doppler parameters are indeterminate  pattern of LV diastolic filling.  4. Global right ventricle has mildly reduced systolic function.The right  ventricular size is normal. No increase in right ventricular wall  thickness.  5. Left atrial size was severely dilated.  6. Right atrial size was severely dilated.  7. Small pericardial effusion.  8. Moderate aortic valve annular calcification.  9. Moderate mitral annular calcification.  10. The mitral valve is normal in structure. Trace mitral valve  regurgitation.  11. The tricuspid valve is normal in structure. Tricuspid valve  regurgitation moderate.  12. The aortic valve is tricuspid Aortic valve regurgitation is trivial by  color flow Doppler. Moderate aortic valve stenosis.  13. There is Moderate calcification of the aortic valve.  14. There is Moderate thickening of the aortic valve.  15. Calcified leaflets with limited leaflet  excursion. Visually appears to  minimally open.  16. The pulmonic valve was normal in structure. Pulmonic valve  regurgitation is mild by color flow Doppler.  17. The aortic root was not well visualized.  18. Moderately elevated pulmonary artery systolic pressure.  19. A pacer wire is visualized in the RA and RV.  20. The inferior vena cava is normal in size with greater than 50%  respiratory variability, suggesting right atrial pressure of 3 mmHg.   MyoviewStudy Highlights9/2020    Nuclear stress EF: 58%.  No T wave inversion was noted during stress.  There was no ST segment deviation noted during stress.  This is a low risk study.  No reversible ischemia. LVEF 58% with normal wall motion. This is a low risk study.     ASSESSMENT& PLAN:   1. Chronic DOE - probably multifactorial - she has COPD and moderate AS - notes having to use more diuretics - will increase her maintenance dose to 60 mg a day - still ok to use the 40 mg extra if needed. BMET today. Remains on chronic steroid therapy as well.   2. Persistent AF - CHB with underlying PPM - followed by EP.   3. Chronic anticoagulation - on the lower dose of Xarelto - no problems noted.   4. Prior AV nodal ablation  5. HTN - BP is fine on her current regimen - she is on chronic higher doses of ARB - no changes made today. She did have higher doses - now on higher dose of beta blocker which we will continue.   6. Chronic diastolic HF - see #1.   7. Prior TIA x 2  8. Known AS - may need to consider updating - will see how she does with additional diuretics.    Current medicines are reviewed with the patient today.  The patient does not have concerns regarding medicines other than what has been noted above.  The following changes have been made:  See above.  Labs/ tests ordered today include:   No orders of the defined types were placed in this encounter.    Disposition:   FU with *** in {gen  number [id number]:536644} {Days to years:10300}.   Patient is agreeable to this plan and will call if any problems develop in the  interim.   SignedTruitt Merle, NP  10/26/2019 9:10 AM  [facility] [address] [place], [place]  [address] Phone: [phone] Fax: [phone]

## 2019-10-27 ENCOUNTER — Telehealth: Payer: Self-pay | Admitting: Internal Medicine

## 2019-10-27 ENCOUNTER — Encounter (HOSPITAL_COMMUNITY): Payer: Self-pay | Admitting: Emergency Medicine

## 2019-10-27 ENCOUNTER — Inpatient Hospital Stay (HOSPITAL_COMMUNITY)
Admission: EM | Admit: 2019-10-27 | Discharge: 2019-10-29 | DRG: 194 | Disposition: A | Discharge status: Home / Self Care | Payer: Medicare Other | Attending: Family Medicine | Admitting: Family Medicine

## 2019-10-27 ENCOUNTER — Emergency Department (HOSPITAL_COMMUNITY): Payer: Medicare Other

## 2019-10-27 ENCOUNTER — Encounter: Payer: Self-pay | Admitting: Internal Medicine

## 2019-10-27 ENCOUNTER — Other Ambulatory Visit: Payer: Self-pay

## 2019-10-27 DIAGNOSIS — I442 Atrioventricular block, complete: Secondary | ICD-10-CM | POA: Diagnosis present

## 2019-10-27 DIAGNOSIS — Z79899 Other long term (current) drug therapy: Secondary | ICD-10-CM

## 2019-10-27 DIAGNOSIS — J441 Chronic obstructive pulmonary disease with (acute) exacerbation: Secondary | ICD-10-CM | POA: Diagnosis present

## 2019-10-27 DIAGNOSIS — I350 Nonrheumatic aortic (valve) stenosis: Secondary | ICD-10-CM | POA: Diagnosis present

## 2019-10-27 DIAGNOSIS — I4821 Permanent atrial fibrillation: Secondary | ICD-10-CM | POA: Diagnosis present

## 2019-10-27 DIAGNOSIS — J189 Pneumonia, unspecified organism: Secondary | ICD-10-CM | POA: Diagnosis not present

## 2019-10-27 DIAGNOSIS — H40053 Ocular hypertension, bilateral: Secondary | ICD-10-CM | POA: Diagnosis present

## 2019-10-27 DIAGNOSIS — Z96651 Presence of right artificial knee joint: Secondary | ICD-10-CM | POA: Diagnosis present

## 2019-10-27 DIAGNOSIS — N1831 Chronic kidney disease, stage 3a: Secondary | ICD-10-CM | POA: Diagnosis not present

## 2019-10-27 DIAGNOSIS — H409 Unspecified glaucoma: Secondary | ICD-10-CM | POA: Diagnosis present

## 2019-10-27 DIAGNOSIS — K219 Gastro-esophageal reflux disease without esophagitis: Secondary | ICD-10-CM | POA: Diagnosis present

## 2019-10-27 DIAGNOSIS — I5032 Chronic diastolic (congestive) heart failure: Secondary | ICD-10-CM | POA: Diagnosis present

## 2019-10-27 DIAGNOSIS — Z7952 Long term (current) use of systemic steroids: Secondary | ICD-10-CM

## 2019-10-27 DIAGNOSIS — E7800 Pure hypercholesterolemia, unspecified: Secondary | ICD-10-CM | POA: Diagnosis present

## 2019-10-27 DIAGNOSIS — Z7901 Long term (current) use of anticoagulants: Secondary | ICD-10-CM

## 2019-10-27 DIAGNOSIS — K5790 Diverticulosis of intestine, part unspecified, without perforation or abscess without bleeding: Secondary | ICD-10-CM | POA: Diagnosis present

## 2019-10-27 DIAGNOSIS — E785 Hyperlipidemia, unspecified: Secondary | ICD-10-CM | POA: Diagnosis present

## 2019-10-27 DIAGNOSIS — J449 Chronic obstructive pulmonary disease, unspecified: Secondary | ICD-10-CM | POA: Diagnosis present

## 2019-10-27 DIAGNOSIS — Z950 Presence of cardiac pacemaker: Secondary | ICD-10-CM

## 2019-10-27 DIAGNOSIS — H3530 Unspecified macular degeneration: Secondary | ICD-10-CM | POA: Diagnosis not present

## 2019-10-27 DIAGNOSIS — I10 Essential (primary) hypertension: Secondary | ICD-10-CM | POA: Diagnosis not present

## 2019-10-27 DIAGNOSIS — Z888 Allergy status to other drugs, medicaments and biological substances status: Secondary | ICD-10-CM

## 2019-10-27 DIAGNOSIS — Z905 Acquired absence of kidney: Secondary | ICD-10-CM

## 2019-10-27 DIAGNOSIS — M1990 Unspecified osteoarthritis, unspecified site: Secondary | ICD-10-CM | POA: Diagnosis present

## 2019-10-27 DIAGNOSIS — Z20822 Contact with and (suspected) exposure to covid-19: Secondary | ICD-10-CM | POA: Diagnosis present

## 2019-10-27 DIAGNOSIS — Z87442 Personal history of urinary calculi: Secondary | ICD-10-CM | POA: Diagnosis not present

## 2019-10-27 DIAGNOSIS — Z66 Do not resuscitate: Secondary | ICD-10-CM | POA: Diagnosis present

## 2019-10-27 DIAGNOSIS — Z86718 Personal history of other venous thrombosis and embolism: Secondary | ICD-10-CM | POA: Diagnosis not present

## 2019-10-27 DIAGNOSIS — R0602 Shortness of breath: Secondary | ICD-10-CM | POA: Diagnosis not present

## 2019-10-27 DIAGNOSIS — J440 Chronic obstructive pulmonary disease with acute lower respiratory infection: Secondary | ICD-10-CM | POA: Diagnosis present

## 2019-10-27 DIAGNOSIS — M810 Age-related osteoporosis without current pathological fracture: Secondary | ICD-10-CM | POA: Diagnosis present

## 2019-10-27 DIAGNOSIS — I130 Hypertensive heart and chronic kidney disease with heart failure and stage 1 through stage 4 chronic kidney disease, or unspecified chronic kidney disease: Secondary | ICD-10-CM | POA: Diagnosis present

## 2019-10-27 DIAGNOSIS — Z823 Family history of stroke: Secondary | ICD-10-CM

## 2019-10-27 DIAGNOSIS — D631 Anemia in chronic kidney disease: Secondary | ICD-10-CM | POA: Diagnosis present

## 2019-10-27 DIAGNOSIS — J181 Lobar pneumonia, unspecified organism: Principal | ICD-10-CM | POA: Diagnosis present

## 2019-10-27 DIAGNOSIS — E039 Hypothyroidism, unspecified: Secondary | ICD-10-CM | POA: Diagnosis not present

## 2019-10-27 DIAGNOSIS — R05 Cough: Secondary | ICD-10-CM | POA: Diagnosis not present

## 2019-10-27 DIAGNOSIS — N183 Chronic kidney disease, stage 3 unspecified: Secondary | ICD-10-CM | POA: Diagnosis present

## 2019-10-27 DIAGNOSIS — Z8249 Family history of ischemic heart disease and other diseases of the circulatory system: Secondary | ICD-10-CM

## 2019-10-27 DIAGNOSIS — Z7951 Long term (current) use of inhaled steroids: Secondary | ICD-10-CM

## 2019-10-27 DIAGNOSIS — Z841 Family history of disorders of kidney and ureter: Secondary | ICD-10-CM

## 2019-10-27 DIAGNOSIS — Z803 Family history of malignant neoplasm of breast: Secondary | ICD-10-CM

## 2019-10-27 LAB — COMPREHENSIVE METABOLIC PANEL
ALT: 26 U/L (ref 0–44)
AST: 35 U/L (ref 15–41)
Albumin: 4.3 g/dL (ref 3.5–5.0)
Alkaline Phosphatase: 57 U/L (ref 38–126)
Anion gap: 13 (ref 5–15)
BUN: 21 mg/dL (ref 8–23)
CO2: 29 mmol/L (ref 22–32)
Calcium: 9.4 mg/dL (ref 8.9–10.3)
Chloride: 100 mmol/L (ref 98–111)
Creatinine, Ser: 0.91 mg/dL (ref 0.44–1.00)
GFR calc Af Amer: >60 mL/min (ref >60)
GFR calc non Af Amer: 55 mL/min — ABNORMAL LOW (ref >60)
Glucose, Bld: 102 mg/dL — ABNORMAL HIGH (ref 70–99)
Potassium: 3.5 mmol/L (ref 3.5–5.1)
Sodium: 142 mmol/L (ref 135–145)
Total Bilirubin: 1 mg/dL (ref 0.3–1.2)
Total Protein: 7.1 g/dL (ref 6.5–8.1)

## 2019-10-27 LAB — CBC WITH DIFFERENTIAL/PLATELET
Abs Immature Granulocytes: 0.03 10*3/uL (ref 0.00–0.07)
Basophils Absolute: 0 10*3/uL (ref 0.0–0.1)
Basophils Relative: 0 %
Eosinophils Absolute: 0 10*3/uL (ref 0.0–0.5)
Eosinophils Relative: 1 %
HCT: 40.8 % (ref 36.0–46.0)
Hemoglobin: 13.1 g/dL (ref 12.0–15.0)
Immature Granulocytes: 1 %
Lymphocytes Relative: 20 %
Lymphs Abs: 1 10*3/uL (ref 0.7–4.0)
MCH: 31.8 pg (ref 26.0–34.0)
MCHC: 32.1 g/dL (ref 30.0–36.0)
MCV: 99 fL (ref 80.0–100.0)
Monocytes Absolute: 0.6 10*3/uL (ref 0.1–1.0)
Monocytes Relative: 12 %
Neutro Abs: 3.3 10*3/uL (ref 1.7–7.7)
Neutrophils Relative %: 66 %
Platelets: 229 10*3/uL (ref 150–400)
RBC: 4.12 MIL/uL (ref 3.87–5.11)
RDW: 13.2 % (ref 11.5–15.5)
WBC: 4.9 10*3/uL (ref 4.0–10.5)
nRBC: 0 % (ref 0.0–0.2)

## 2019-10-27 LAB — MAGNESIUM: Magnesium: 2.5 mg/dL — ABNORMAL HIGH (ref 1.7–2.4)

## 2019-10-27 LAB — LACTIC ACID, PLASMA: Lactic Acid, Venous: 1.4 mmol/L (ref 0.5–1.9)

## 2019-10-27 LAB — SARS CORONAVIRUS 2 BY RT PCR (HOSPITAL ORDER, PERFORMED IN ~~LOC~~ HOSPITAL LAB): SARS Coronavirus 2: NEGATIVE

## 2019-10-27 LAB — PHOSPHORUS: Phosphorus: 3 mg/dL (ref 2.5–4.6)

## 2019-10-27 MED ORDER — SODIUM CHLORIDE 0.9 % IV SOLN
100.0000 mg | Freq: Two times a day (BID) | INTRAVENOUS | Status: DC
  Administered 2019-10-28 – 2019-10-29 (×3): 100 mg via INTRAVENOUS
  Filled 2019-10-27 (×3): qty 100

## 2019-10-27 MED ORDER — DOXYCYCLINE HYCLATE 100 MG PO TABS
100.0000 mg | ORAL_TABLET | Freq: Once | ORAL | Status: AC
  Administered 2019-10-27: 100 mg via ORAL
  Filled 2019-10-27: qty 1

## 2019-10-27 MED ORDER — IPRATROPIUM-ALBUTEROL 0.5-2.5 (3) MG/3ML IN SOLN
3.0000 mL | Freq: Four times a day (QID) | RESPIRATORY_TRACT | Status: DC
  Administered 2019-10-28 – 2019-10-29 (×6): 3 mL via RESPIRATORY_TRACT
  Filled 2019-10-27 (×6): qty 3

## 2019-10-27 MED ORDER — ONDANSETRON HCL 4 MG PO TABS: 4.0000 mg | ORAL_TABLET | Freq: Four times a day (QID) | ORAL | Status: DC | PRN

## 2019-10-27 MED ORDER — ALBUTEROL SULFATE HFA 108 (90 BASE) MCG/ACT IN AERS
2.0000 | INHALATION_SPRAY | Freq: Once | RESPIRATORY_TRACT | Status: AC
  Administered 2019-10-27: 2 via RESPIRATORY_TRACT
  Filled 2019-10-27: qty 6.7

## 2019-10-27 MED ORDER — ONDANSETRON HCL 4 MG/2ML IJ SOLN: 4.0000 mg | Freq: Four times a day (QID) | INTRAMUSCULAR | Status: DC | PRN

## 2019-10-27 MED ORDER — GUAIFENESIN ER 600 MG PO TB12
600.0000 mg | ORAL_TABLET | Freq: Two times a day (BID) | ORAL | Status: DC
  Administered 2019-10-27 – 2019-10-29 (×4): 600 mg via ORAL
  Filled 2019-10-27 (×4): qty 1

## 2019-10-27 MED ORDER — IPRATROPIUM BROMIDE 0.02 % IN SOLN: 0.5000 mg | Freq: Four times a day (QID) | RESPIRATORY_TRACT | Status: DC

## 2019-10-27 MED ORDER — ACETAMINOPHEN 650 MG RE SUPP: 650.0000 mg | Freq: Four times a day (QID) | RECTAL | Status: DC | PRN

## 2019-10-27 MED ORDER — ACETAMINOPHEN 325 MG PO TABS: 650.0000 mg | ORAL_TABLET | Freq: Four times a day (QID) | ORAL | Status: DC | PRN

## 2019-10-27 MED ORDER — METHYLPREDNISOLONE SODIUM SUCC 125 MG IJ SOLR
60.0000 mg | Freq: Once | INTRAMUSCULAR | Status: AC
  Administered 2019-10-27: 60 mg via INTRAVENOUS
  Filled 2019-10-27: qty 2

## 2019-10-27 MED ORDER — SODIUM CHLORIDE 0.9 % IV SOLN
1.0000 g | Freq: Once | INTRAVENOUS | Status: AC
  Administered 2019-10-27: 1 g via INTRAVENOUS
  Filled 2019-10-27: qty 10

## 2019-10-27 MED ORDER — ALBUTEROL SULFATE (2.5 MG/3ML) 0.083% IN NEBU: 2.5000 mg | INHALATION_SOLUTION | Freq: Four times a day (QID) | RESPIRATORY_TRACT | Status: DC

## 2019-10-27 MED ORDER — SODIUM CHLORIDE 0.9 % IV SOLN
1.0000 g | INTRAVENOUS | Status: DC
  Administered 2019-10-28: 1 g via INTRAVENOUS
  Filled 2019-10-27: qty 1

## 2019-10-27 NOTE — Telephone Encounter (Signed)
Patient is having is wheezing and sounds much worse then Monday. She has taken abx and rescue inhalers and tessalon pearls, with no improvement. Coughing up mucus with white. Nurse at [facility]  Is concerned that Abx is not being affective this time..    Dr. [name] please advise.   Allergies as of 10/27/2019      Reactions   Brimonidine Tartrate-timolol Other (See Comments)   REACTION: systemic malaise amigen eye drop   Penicillins Hives   Breo Ellipta [fluticasone Furoate-vilanterol] Other (See Comments)   Ran blood pressure up Increased blood pressure      Medication List       Accurate as of October 27, 2019 11:39 AM. If you have any questions, ask your nurse or doctor.        amLODipine 5 MG tablet Commonly known as: NORVASC TAKE 1 TABLET BY MOUTH  DAILY   atorvastatin 40 MG tablet Commonly known as: LIPITOR TAKE 1 TABLET BY MOUTH ONCE DAILY FOR CHOLESTEROL   azithromycin 250 MG tablet Commonly known as: ZITHROMAX 2 tabs today, then one daily   benzonatate 200 MG capsule Commonly known as: TESSALON TAKE 1 CAPSULE BY MOUTH THREE TIMES DAILY AS NEEDED FOR COUGH.   fluticasone 50 MCG/ACT nasal spray Commonly known as: FLONASE USE 2 SPRAYS EACH NOSTRIL ONCE A DAY AS NEEDED FOR ALLERGIES OR CONGESTION.   furosemide 40 MG tablet Commonly known as: LASIX Take 1.5 tablets (60 mg total) by mouth daily. Ok to use an extra dose of 40 mg for weight gain   gabapentin 100 MG capsule Commonly known as: NEURONTIN TAKE 1 CAPSULE BY MOUTH AT  BEDTIME   latanoprost 0.005 % ophthalmic solution Commonly known as: XALATAN Place 1 drop into both eyes at bedtime.   levothyroxine 88 MCG tablet Commonly known as: SYNTHROID TAKE 1 TABLET BY MOUTH  DAILY BEFORE BREAKFAST   losartan 100 MG tablet Commonly known as: COZAAR Take 1 tablet (100 mg total) by mouth at bedtime. At bedtime   losartan 100 MG tablet Commonly known as: COZAAR Take 1 tablet (100 mg total) by mouth daily at  12 noon.   LUTEIN PO Take 25 mg by mouth daily.   Magnesium 250 MG Tabs Take 1 tablet by mouth daily.   metoprolol tartrate 100 MG tablet Commonly known as: LOPRESSOR Take 100 mg by mouth daily.   montelukast 10 MG tablet Commonly known as: SINGULAIR TAKE 1 TABLET BY MOUTH  DAILY   potassium chloride SA 20 MEQ tablet Commonly known as: KLOR-CON TAKE 1 TABLET BY MOUTH TWICE DAILY.   predniSONE 5 MG tablet Commonly known as: DELTASONE Take 1 tablet (5 mg total) by mouth daily with breakfast.   PreserVision AREDS 2 Caps Take 1 capsule by mouth daily.   Rivaroxaban 15 MG Tabs tablet Commonly known as: Xarelto TAKE 1 TABLET BY MOUTH  DAILY WITH SUPPER   Symbicort 160-4.5 MCG/ACT inhaler Generic drug: budesonide-formoterol INHALE 2 PUFFS BY MOUTH  INTO THE LUNGS TWO TIMES  DAILY   Ventolin HFA 108 (90 Base) MCG/ACT inhaler Generic drug: albuterol USE 2 PUFFS EVERY 6 HOURS AS NEEDED FOR SHORTNESS OF BREATH AND WHEEZING.   vitamin B-12 1000 MCG tablet Commonly known as: CYANOCOBALAMIN Take 1,000 mcg by mouth daily.   Vitamin D 50 MCG (2000 UT) Caps Take 1 capsule by mouth daily.

## 2019-10-27 NOTE — H&P (Signed)
History and Physical    Jody Blake:[id number] DOB: [birth year] DOA: 10/27/2019  PCP: Gayland Curry, DO  Patient coming from:  Home.  I have personally briefly reviewed patient's old medical records in [place]  Chief Complaint: Cough for a week.  HPI: Jody Blake is a 86 y.o. female with medical history significant of normocytic anemia, aortic stenosis, COPD, permanent atrial fibrillation, history of tachycardia/bradycardia syndrome, history of complete heart block, history of pacemaker placement, diastolic dysfunction, DDD, history of DVT of LLE, hypertension, hypothyroidism, macular degeneration, glaucoma, osteoporosis/osteoarthritis, pure hyperlipidemia, urolithiasis who is brought to the emergency department due to productive cough of whitish sputum, wheezing and dyspnea for about a week associated with chest tightness, fatigue, decreased appetite and malaise.  She denies headache, sore throat, rhinorrhea, hemoptysis, abdominal pain, nausea, vomiting, diarrhea, constipation, melena or hematochezia.  No dysuria, frequency or hematuria.  No polyuria, polydipsia, polyphagia or blurred vision.  She saw her pulmonologist and was given prednisone and azithromycin without significant relief.  ED Course: Initial vital signs were temperature 98 F, pulse 93, respirations 17, blood pressure 209/115 mmHg and O2 sat 98% on room air.  The patient received ceftriaxone and doxycycline in the emergency department.  Urinalysis was normal.  CBC showed white count 4.9, hemoglobin 13.1 g/dL and platelets 229.  Lactic acid was normal.  CMP showed a glucose of 102 mg/dL, but was otherwise normal.  Lactic acid was normal.  Her chest radiograph showed left lower lobe focal pulmonary infiltrate.  Review of Systems: As per HPI otherwise all other systems reviewed and are negative.  Past Medical History:  Diagnosis Date  . Anemia   . Aortic stenosis    a. Echo 09/06/12 EF 55-60%, no WMA, G2DD, Ao  valve sclerosis w/ mod stenosis, LA mildly dilated, PA pressure 23mmHg  . Asthmatic bronchitis   . Complete heart block [facility])    s/p permanent pacemaker 06/27/1999 (Battery change 06/2007 and 2016).  s/p AV nodal ablation by Dr [name] 2016.  [name] COPD (chronic obstructive pulmonary disease) (HCC)    Dr. [name]  . DDD (degenerative disc disease)   . Diastolic dysfunction    a. Echo 09/06/12 EF 55-60%, no WMA, G2DD, Ao valve sclerosis w/ mod stenosis, LA mildly dilated, PA pressure 29mmHg  . Diverticulosis   . DVT (deep vein thrombosis) in pregnancy 1954   a. LLE  . Hypertension   . Hypothyroidism    on medication  . Macular degeneration    Dr. [name]  . Osteoarthrosis, unspecified whether generalized or localized, other specified sites   . PAF (paroxysmal atrial fibrillation) (HCC)    Stopped flecainide, on amiodarone but still has bouts of A FIB (mostly in mornings).   . Pure hypercholesterolemia   . Staghorn calculus    Left   Past Surgical History:  Procedure Laterality Date  . APPENDECTOMY  ~ 1941  . AV NODE ABLATION  05/10/2014  . AV NODE ABLATION N/A 05/10/2014   Procedure: AV NODE ABLATION;  Surgeon: Thompson Grayer, MD;  Location: [facility] CATH LAB;  Service: Cardiovascular;  Laterality: N/A;  . BUNIONECTOMY WITH HAMMERTOE RECONSTRUCTION Bilateral ~ 1990  . CARDIAC PACEMAKER PLACEMENT  06/27/99   Medtronic PM implanted by Dr [name]  . CARDIOVERSION N/A 12/02/2013   Procedure: CARDIOVERSION;  Surgeon: [name], MD;  Location: [place];  Service: Cardiovascular;  Laterality: N/A;  . CATARACT EXTRACTION W/ INTRAOCULAR LENS  IMPLANT, BILATERAL Bilateral   . COLONOSCOPY WITH PROPOFOL N/A 04/22/2013  Procedure: COLONOSCOPY WITH PROPOFOL;  Surgeon: [name], MD;  Location: [facility] ENDOSCOPY;  Service: Endoscopy;  Laterality: N/A;  . DILATION AND CURETTAGE OF UTERUS  X 2   "when I was going thru menopause"  . ESOPHAGOGASTRODUODENOSCOPY (EGD) WITH PROPOFOL N/A 04/22/2013   Procedure:  ESOPHAGOGASTRODUODENOSCOPY (EGD) WITH PROPOFOL;  Surgeon: [name], MD;  Location: [facility] ENDOSCOPY;  Service: Endoscopy;  Laterality: N/A;  . HERNIA REPAIR    . INCISIONAL HERNIA REPAIR    . INSERT / REPLACE / REMOVE PACEMAKER  06/2007   "took out the old; put in new"  . INSERT / REPLACE / REMOVE PACEMAKER  05/10/2014   MDT PPM generator change by Dr [name]  . JOINT REPLACEMENT    . PARTIAL NEPHRECTOMY Left 05/1974   stone disease  . PERMANENT PACEMAKER GENERATOR CHANGE N/A 05/10/2014   Procedure: PERMANENT PACEMAKER GENERATOR CHANGE;  Surgeon: Thompson Grayer, MD;  Location: [facility] CATH LAB;  Service: Cardiovascular;  Laterality: N/A;  . TONSILLECTOMY AND ADENOIDECTOMY  1930's  . TOTAL KNEE ARTHROPLASTY Right 2001   Social History  reports that she has never smoked. She has never used smokeless tobacco. She reports current alcohol use of about 7.0 standard drinks of alcohol per week. She reports that she does not use drugs.  Allergies  Allergen Reactions  . Brimonidine Tartrate-Timolol Other (See Comments)    REACTION: systemic malaise amigen eye drop  . Penicillins Hives  . Breo Ellipta [Fluticasone Furoate-Vilanterol] Other (See Comments)    Ran blood pressure up Increased blood pressure   Family History  Problem Relation Age of Onset  . Malignant hypertension Father   . Hypertension Father   . Renal Disease Father   . Breast cancer Mother   . Heart attack Brother   . Stroke Brother    Prior to Admission medications   Medication Sig Start Date End Date Taking? Authorizing Provider  amLODipine (NORVASC) 5 MG tablet TAKE 1 TABLET BY MOUTH  DAILY Patient taking differently: Take 5 mg by mouth daily.  09/24/18  Yes Reed, Tiffany L, DO  atorvastatin (LIPITOR) 40 MG tablet TAKE 1 TABLET BY MOUTH ONCE DAILY FOR CHOLESTEROL Patient taking differently: Take 40 mg by mouth daily. FOR CHOLESTEROL 02/15/19  Yes Reed, Tiffany L, DO  Cholecalciferol (VITAMIN D) 2000 units CAPS Take 1 capsule by  mouth daily.   Yes [provider]  fluticasone (FLONASE) 50 MCG/ACT nasal spray USE 2 SPRAYS EACH NOSTRIL ONCE A DAY AS NEEDED FOR ALLERGIES OR CONGESTION. Patient taking differently: Place 2 sprays into both nostrils daily as needed for allergies or rhinitis.  12/07/18  Yes Young, Tarri Fuller D, MD  furosemide (LASIX) 40 MG tablet Take 1.5 tablets (60 mg total) by mouth daily. Ok to use an extra dose of 40 mg for weight gain 09/20/19  Yes Burtis Junes, NP  gabapentin (NEURONTIN) 100 MG capsule TAKE 1 CAPSULE BY MOUTH AT  BEDTIME Patient taking differently: Take 100 mg by mouth at bedtime.  09/24/18  Yes Reed, Tiffany L, DO  latanoprost (XALATAN) 0.005 % ophthalmic solution Place 1 drop into both eyes at bedtime.   Yes [provider]  levothyroxine (SYNTHROID) 88 MCG tablet TAKE 1 TABLET BY MOUTH  DAILY BEFORE BREAKFAST Patient taking differently: Take 88 mcg by mouth daily.  03/29/19  Yes Reed, Tiffany L, DO  losartan (COZAAR) 100 MG tablet Take 1 tablet (100 mg total) by mouth at bedtime. At bedtime 06/01/19  Yes Burtis Junes, NP  losartan (  COZAAR) 100 MG tablet Take 1 tablet (100 mg total) by mouth daily at 12 noon. 07/07/19  Yes Reed, Tiffany L, DO  LUTEIN PO Take 25 mg by mouth daily.    Yes [provider]  Magnesium 250 MG TABS Take 1 tablet by mouth daily.   Yes [provider]  metoprolol succinate (TOPROL-XL) 100 MG 24 hr tablet Take 100 mg by mouth daily. 10/18/19  Yes [provider]  montelukast (SINGULAIR) 10 MG tablet TAKE 1 TABLET BY MOUTH  DAILY 02/17/19  Yes Young, Tarri Fuller D, MD  Multiple Vitamins-Minerals (PRESERVISION AREDS 2) CAPS Take 1 capsule by mouth in the morning and at bedtime.    Yes [provider]  potassium chloride SA (K-DUR) 20 MEQ tablet TAKE 1 TABLET BY MOUTH TWICE DAILY. Patient taking differently: Take 20 mEq by mouth daily.  08/10/18  Yes Jerline Pain, MD  predniSONE (DELTASONE) 5 MG tablet Take 1 tablet (5  mg total) by mouth daily with breakfast. 05/13/19  Yes Young, Clinton D, MD  Rivaroxaban (XARELTO) 15 MG TABS tablet TAKE 1 TABLET BY MOUTH  DAILY WITH SUPPER Patient taking differently: Take 15 mg by mouth daily with supper.  05/10/19  Yes Jerline Pain, MD  SYMBICORT 160-4.5 MCG/ACT inhaler INHALE 2 PUFFS BY MOUTH  INTO THE LUNGS TWO TIMES  DAILY Patient taking differently: Inhale 2 puffs into the lungs in the morning and at bedtime.  02/17/19  Yes Young, Tarri Fuller D, MD  VENTOLIN HFA 108 (90 Base) MCG/ACT inhaler USE 2 PUFFS EVERY 6 HOURS AS NEEDED FOR SHORTNESS OF BREATH AND WHEEZING. Patient taking differently: Inhale 2 puffs into the lungs every 6 (six) hours as needed for wheezing or shortness of breath.  03/02/19  Yes Young, Tarri Fuller D, MD  vitamin B-12 (CYANOCOBALAMIN) 1000 MCG tablet Take 1,000 mcg by mouth daily.   Yes [provider]  azithromycin (ZITHROMAX) 250 MG tablet 2 tabs today, then one daily Patient not taking: Reported on 10/27/2019 10/25/19   Baird Lyons D, MD  benzonatate (TESSALON) 200 MG capsule TAKE 1 CAPSULE BY MOUTH THREE TIMES DAILY AS NEEDED FOR COUGH. Patient not taking: Reported on 10/27/2019 03/21/19   Deneise Lever, MD   Physical Exam: Vitals:   10/27/19 2100 10/27/19 2130 10/27/19 2200 10/27/19 2230  BP: (!) 189/86 (!) 187/99 (!) 178/95 (!) 157/90  Pulse:  65 65 (!) 57  Resp: 12 (!) 22 14 14   Temp:      TempSrc:      SpO2: 97% 96% 93% 92%   Constitutional: Frail, elderly female. Eyes: PERRL, lids and conjunctivae mildly injected. ENMT: Mucous membranes are moist. Posterior pharynx clear of any exudate or lesions. Neck: normal, supple, no masses, no thyromegaly Respiratory: Decreased breath sounds with bilateral rhonchi and wheezing, LLL crackles. Normal respiratory effort. No accessory muscle use.  Cardiovascular: Regular rate and rhythm, 2/6 SEM, no rubs / gallops.  1+ lower extremities pitting edema. 2+ pedal pulses. No carotid bruits.    Abdomen: No distention.  BS positive.  Soft, no tenderness, no masses palpated. No hepatosplenomegaly. Musculoskeletal: no clubbing / cyanosis.  Good ROM, no contractures. Normal muscle tone.  Skin: Multiple areas of ecchymosis some extremities. Neurologic: CN 2-12 grossly intact. Sensation intact, DTR normal. Strength 5/5 in all 4.  Psychiatric: Normal judgment and insight. Alert and oriented x 3. Normal mood.   Labs on Admission: I have personally reviewed following labs and imaging studies  CBC: Recent Labs  Lab 10/27/19  1928  WBC 4.9  NEUTROABS 3.3  HGB 13.1  HCT 40.8  MCV 99.0  PLT 073    Basic Metabolic Panel: Recent Labs  Lab 10/27/19 1928  NA 142  K 3.5  CL 100  CO2 29  GLUCOSE 102*  BUN 21  CREATININE 0.91  CALCIUM 9.4  MG 2.5*  PHOS 3.0    GFR: Estimated Creatinine Clearance: 33.3 mL/min (by C-G formula based on SCr of 0.91 mg/dL).  Liver Function Tests: Recent Labs  Lab 10/27/19 1928  AST 35  ALT 26  ALKPHOS 57  BILITOT 1.0  PROT 7.1  ALBUMIN 4.3   Radiological Exams on Admission: DG Chest 2 View  Result Date: 10/27/2019 CLINICAL DATA:  Productive cough EXAM: CHEST - 2 VIEW COMPARISON:  10/29/2018 FINDINGS: Focal airspace infiltrate is noted within the left lower lobe, possibly infectious in the appropriate clinical setting. Aspiration could also appear similarly. No pneumothorax or pleural effusion. Mild cardiomegaly is unchanged. Thoracic aorta is ectatic. Left subclavian dual lead pacemaker is unchanged. Pulmonary vascularity is normal. Degenerative changes are seen within the thoracolumbar spine. IMPRESSION: Left lower lobe focal pulmonary infiltrate, infection versus aspiration. Electronically Signed   By: Fidela Salisbury MD   On: 10/27/2019 17:57   EKG: Independently reviewed. Vent. rate 89 BPM PR interval * ms QRS duration 170 ms QT/QTc 437/532 ms P-R-T axes * -86 95 Atrial fibrillation Nonspecific IVCD with LAD LVH with secondary  repolarization abnormality  Assessment/Plan Principal Problem:   CAP (community acquired pneumonia) Admit to telemetry/inpatient. Continue supplemental oxygen. DuoNeb every 6 hours. Albuterol neb every 4 hours as needed. Continue ceftriaxone 1 g IVPB daily. Continue doxycycline 100 mg p.o. twice daily. Incentive spirometry as tolerated.  Active Problems:   COPD mixed type ([place]) Continue supplemental oxygen. Continue bronchodilators. Continue treatment for pneumonia. Received single dose methylpredni.  Solone.    Essential hypertension Continue Toprol-XL 100 mg p.o. daily. Continue losartan 100 mg p.o. twice daily. Continue furosemide 40 mg p.o. daily. Monitor BP, heart rate, renal function electrolytes.    Permanent atrial fibrillation (HCC) CHA?DS?-VASc Score of at least 6 (No history of DM or CVA). Continue Xarelto 15 mg p.o. daily. Continue metoprolol succinate for rate control.    GERD Start PPI since the patient is on Xarelto.    Bilateral ocular hypertension Continue Xalatan drops.    Hypothyroidism Continue Synthroid 88 mcg p.o. daily.    CKD (chronic kidney disease), stage IIIa Monitor renal function electrolytes. Continue ARB antihypertensive therapy.    Chronic diastolic congestive heart failure (HCC) Continue ARB, beta-blocker and furosemide.    DVT prophylaxis: On Xarelto. Code Status:   DNR. Family Communication:   Disposition Plan:   Patient is from:             Home.  Anticipated DC to:  TBD.  Anticipated DC date:  10/29/2019.  Anticipated DC barriers: Clinical status. Consults called: Admission status:  Inpatient/telemetry.   Severity of Illness: High.  Reubin Milan MD [facility]  How to contact the [facility] Attending or Consulting provider [place] or covering provider during after hours [place], for this patient?   1. Check the care team in [facility] and look for a) attending/consulting [facility] provider listed and b) the [facility] team  listed 2. Log into [email or web address] and use [place]'s universal password to access. If you do not have the password, please contact the hospital operator. 3. Locate the [facility] provider you are looking for under [facility] and  page to a number that you can be directly reached. 4. If you still have difficulty reaching the provider, please page the [facility] (Director on Call) for the Hospitalists listed on amion for assistance.  10/27/2019, 11:04 PM   This document was prepared using Dragon voice recognition software and may contain some unintended transcription errors.

## 2019-10-27 NOTE — Telephone Encounter (Signed)
Need to send to ER for Covid and virus panel testing, CXR.

## 2019-10-27 NOTE — Telephone Encounter (Signed)
Spoke with nurse at [facility], aware of recs.  Nothing further needed at this time- will close encounter.

## 2019-10-27 NOTE — ED Triage Notes (Signed)
Patient BIB son, reports productive cough x1 week. Denies fever, chest pain, SOB. Hx COPD.

## 2019-10-27 NOTE — Assessment & Plan Note (Signed)
Clinically consistent with a viral exacerbation of COPD. We don't have X-ray today. Considered sending off-site, but may not be worth her effort just now. Plan- depomedrol, Zpak, education on inhaler use.

## 2019-10-27 NOTE — ED Provider Notes (Signed)
[facility] DEPT Provider Note   CSN: [id number] Arrival date & time: 10/27/19  1542     History Chief Complaint  Patient presents with  . Cough    Jody Blake is a 86 y.o. female.  The history is provided by the patient, medical records and a relative. No language interpreter was used.  Cough  Jody Blake is a 86 y.o. female who presents to the Emergency Department complaining of cough and shortness of breath. [facility] for evaluation of progressive cough. She began feeling poorly about one week ago. She saw her pulmonologist two days ago insert on azithromycin and prednisone for COPD flare. She states that since starting the medications her symptoms have significantly worsened and she has a cough productive of white sputum. She denies any fevers. She does have tightness in her chest. She is chronic lower extremity edema and that is at her baseline. She has been vaccinated for COVID-19. Symptoms are severe, constant, worsening. She contacted her pulmonologist today, who recommended evaluation the emergency department with respiratory viral panel and x-ray.    Past Medical History:  Diagnosis Date  . Anemia   . Aortic stenosis    a. Echo 09/06/12 EF 55-60%, no WMA, G2DD, Ao valve sclerosis w/ mod stenosis, LA mildly dilated, PA pressure 77mmHg  . Asthmatic bronchitis   . Complete heart block [facility])    s/p permanent pacemaker 06/27/1999 (Battery change 06/2007 and 2016).  s/p AV nodal ablation by Dr [name] 2016.  [name] COPD (chronic obstructive pulmonary disease) (HCC)    Dr. [name]  . DDD (degenerative disc disease)   . Diastolic dysfunction    a. Echo 09/06/12 EF 55-60%, no WMA, G2DD, Ao valve sclerosis w/ mod stenosis, LA mildly dilated, PA pressure 60mmHg  . Diverticulosis   . DVT (deep vein thrombosis) in pregnancy 1954   a. LLE  . Hypertension   . Hypothyroidism    on medication  . Macular degeneration      Dr. [name]  . Osteoarthrosis, unspecified whether generalized or localized, other specified sites   . PAF (paroxysmal atrial fibrillation) (HCC)    Stopped flecainide, on amiodarone but still has bouts of A FIB (mostly in mornings).   . Pure hypercholesterolemia   . Staghorn calculus    Left    Patient Active Problem List   Diagnosis Date Noted  . CAP (community acquired pneumonia) 10/27/2019  . Plantar fasciitis of right foot 07/07/2019  . Acute embolism and thrombosis of deep vein of lower extremity ([place]) 03/18/2018  . Osteoarthritis of right glenohumeral joint 08/22/2017  . Macular pucker, right eye 07/08/2017  . Primary open angle glaucoma of both eyes, mild stage 07/08/2017  . TIA (transient ischemic attack) 06/24/2017  . Hypercoagulable state due to atrial fibrillation ([place]) 03/05/2017  . Ataxia 11/14/2016  . DVT (deep vein thrombosis) in pregnancy 11/14/2016  . CKD (chronic kidney disease), stage III 11/14/2016  . Chronic diastolic congestive heart failure ([place]) 11/14/2016  . Hypertensive urgency 11/14/2016  . Hypothyroidism 11/08/2015  . Age-related macular degeneration, dry, both eyes 09/05/2015  . Bilateral ocular hypertension 09/05/2015  . Pseudophakia of both eyes 09/05/2015  . Pleural effusion on left 06/17/2015  . Complete heart block ([place]) 06/22/2014  . Sick sinus syndrome ([place]) 04/29/2014  . Tachycardia-bradycardia syndrome ([place]) 01/06/2013  . Cardiac pacemaker in situ 09/07/2012  . Long term (current) use of anticoagulants 09/07/2012  . Orthostatic hypotension 09/07/2012  . Syncope 09/06/2012  .  Choroidal nevus of left eye 07/26/2011  . COPD mixed type ([place]) 04/26/2010  . Seasonal and perennial allergic rhinitis 10/27/2008  . Essential hypertension 04/19/2008  . Permanent atrial fibrillation ([place]) 04/19/2008  . GERD 10/29/2007  . APPENDECTOMY, HX OF 04/06/2007    Past Surgical History:  Procedure Laterality Date  . APPENDECTOMY  ~ 1941  . AV NODE  ABLATION  05/10/2014  . AV NODE ABLATION N/A 05/10/2014   Procedure: AV NODE ABLATION;  Surgeon: Thompson Grayer, MD;  Location: [facility] CATH LAB;  Service: Cardiovascular;  Laterality: N/A;  . BUNIONECTOMY WITH HAMMERTOE RECONSTRUCTION Bilateral ~ 1990  . CARDIAC PACEMAKER PLACEMENT  06/27/99   Medtronic PM implanted by Dr [name]  . CARDIOVERSION N/A 12/02/2013   Procedure: CARDIOVERSION;  Surgeon: [name], MD;  Location: [facility] ENDOSCOPY;  Service: Cardiovascular;  Laterality: N/A;  . CATARACT EXTRACTION W/ INTRAOCULAR LENS  IMPLANT, BILATERAL Bilateral   . COLONOSCOPY WITH PROPOFOL N/A 04/22/2013   Procedure: COLONOSCOPY WITH PROPOFOL;  Surgeon: [name], MD;  Location: [facility] ENDOSCOPY;  Service: Endoscopy;  Laterality: N/A;  . DILATION AND CURETTAGE OF UTERUS  X 2   "when I was going thru menopause"  . ESOPHAGOGASTRODUODENOSCOPY (EGD) WITH PROPOFOL N/A 04/22/2013   Procedure: ESOPHAGOGASTRODUODENOSCOPY (EGD) WITH PROPOFOL;  Surgeon: [name], MD;  Location: [facility] ENDOSCOPY;  Service: Endoscopy;  Laterality: N/A;  . HERNIA REPAIR    . INCISIONAL HERNIA REPAIR    . INSERT / REPLACE / REMOVE PACEMAKER  06/2007   "took out the old; put in new"  . INSERT / REPLACE / REMOVE PACEMAKER  05/10/2014   MDT PPM generator change by Dr [name]  . JOINT REPLACEMENT    . PARTIAL NEPHRECTOMY Left 05/1974   stone disease  . PERMANENT PACEMAKER GENERATOR CHANGE N/A 05/10/2014   Procedure: PERMANENT PACEMAKER GENERATOR CHANGE;  Surgeon: Thompson Grayer, MD;  Location: [facility] CATH LAB;  Service: Cardiovascular;  Laterality: N/A;  . TONSILLECTOMY AND ADENOIDECTOMY  1930's  . TOTAL KNEE ARTHROPLASTY Right 2001     OB History    Gravida  2   Para  2   Term      Preterm      AB      Living  2     SAB      TAB      Ectopic      Multiple      Live Births              Family History  Problem Relation Age of Onset  . Malignant hypertension Father   . Hypertension Father   . Renal Disease Father    . Breast cancer Mother   . Heart attack Brother   . Stroke Brother     Social History   Tobacco Use  . Smoking status: Never Smoker  . Smokeless tobacco: Never Used  Vaping Use  . Vaping Use: Never used  Substance Use Topics  . Alcohol use: Yes    Alcohol/week: 7.0 standard drinks    Types: 7 Glasses of wine per week    Comment: occasionally  . Drug use: No    Home Medications Prior to Admission medications   Medication Sig Start Date End Date Taking? Authorizing Provider  amLODipine (NORVASC) 5 MG tablet TAKE 1 TABLET BY MOUTH  DAILY Patient taking differently: Take 5 mg by mouth daily.  09/24/18  Yes Reed, Tiffany L, DO  atorvastatin (LIPITOR) 40 MG tablet TAKE 1 TABLET BY MOUTH  ONCE DAILY FOR CHOLESTEROL Patient taking differently: Take 40 mg by mouth daily. FOR CHOLESTEROL 02/15/19  Yes Reed, Tiffany L, DO  Cholecalciferol (VITAMIN D) 2000 units CAPS Take 1 capsule by mouth daily.   Yes [provider]  fluticasone (FLONASE) 50 MCG/ACT nasal spray USE 2 SPRAYS EACH NOSTRIL ONCE A DAY AS NEEDED FOR ALLERGIES OR CONGESTION. Patient taking differently: Place 2 sprays into both nostrils daily as needed for allergies or rhinitis.  12/07/18  Yes Young, Tarri Fuller D, MD  furosemide (LASIX) 40 MG tablet Take 1.5 tablets (60 mg total) by mouth daily. Ok to use an extra dose of 40 mg for weight gain 09/20/19  Yes Burtis Junes, NP  gabapentin (NEURONTIN) 100 MG capsule TAKE 1 CAPSULE BY MOUTH AT  BEDTIME Patient taking differently: Take 100 mg by mouth at bedtime.  09/24/18  Yes Reed, Tiffany L, DO  latanoprost (XALATAN) 0.005 % ophthalmic solution Place 1 drop into both eyes at bedtime.   Yes [provider]  levothyroxine (SYNTHROID) 88 MCG tablet TAKE 1 TABLET BY MOUTH  DAILY BEFORE BREAKFAST Patient taking differently: Take 88 mcg by mouth daily.  03/29/19  Yes Reed, Tiffany L, DO  losartan (COZAAR) 100 MG tablet Take 1 tablet (100 mg total) by mouth at bedtime. At  bedtime 06/01/19  Yes Burtis Junes, NP  losartan (COZAAR) 100 MG tablet Take 1 tablet (100 mg total) by mouth daily at 12 noon. 07/07/19  Yes Reed, Tiffany L, DO  LUTEIN PO Take 25 mg by mouth daily.    Yes [provider]  Magnesium 250 MG TABS Take 1 tablet by mouth daily.   Yes [provider]  metoprolol succinate (TOPROL-XL) 100 MG 24 hr tablet Take 100 mg by mouth daily. 10/18/19  Yes [provider]  montelukast (SINGULAIR) 10 MG tablet TAKE 1 TABLET BY MOUTH  DAILY 02/17/19  Yes Young, Tarri Fuller D, MD  Multiple Vitamins-Minerals (PRESERVISION AREDS 2) CAPS Take 1 capsule by mouth in the morning and at bedtime.    Yes [provider]  potassium chloride SA (K-DUR) 20 MEQ tablet TAKE 1 TABLET BY MOUTH TWICE DAILY. Patient taking differently: Take 20 mEq by mouth daily.  08/10/18  Yes Jerline Pain, MD  predniSONE (DELTASONE) 5 MG tablet Take 1 tablet (5 mg total) by mouth daily with breakfast. 05/13/19  Yes Young, Clinton D, MD  Rivaroxaban (XARELTO) 15 MG TABS tablet TAKE 1 TABLET BY MOUTH  DAILY WITH SUPPER Patient taking differently: Take 15 mg by mouth daily with supper.  05/10/19  Yes Jerline Pain, MD  SYMBICORT 160-4.5 MCG/ACT inhaler INHALE 2 PUFFS BY MOUTH  INTO THE LUNGS TWO TIMES  DAILY Patient taking differently: Inhale 2 puffs into the lungs in the morning and at bedtime.  02/17/19  Yes Young, Tarri Fuller D, MD  VENTOLIN HFA 108 (90 Base) MCG/ACT inhaler USE 2 PUFFS EVERY 6 HOURS AS NEEDED FOR SHORTNESS OF BREATH AND WHEEZING. Patient taking differently: Inhale 2 puffs into the lungs every 6 (six) hours as needed for wheezing or shortness of breath.  03/02/19  Yes Young, Tarri Fuller D, MD  vitamin B-12 (CYANOCOBALAMIN) 1000 MCG tablet Take 1,000 mcg by mouth daily.   Yes [provider]  azithromycin (ZITHROMAX) 250 MG tablet 2 tabs today, then one daily Patient not taking: Reported on 10/27/2019 10/25/19   Baird Lyons D, MD  benzonatate  (TESSALON) 200 MG capsule TAKE 1 CAPSULE BY MOUTH THREE TIMES DAILY AS NEEDED FOR  COUGH. Patient not taking: Reported on 10/27/2019 03/21/19   Deneise Lever, MD    Allergies    Brimonidine tartrate-timolol, Penicillins, and Breo ellipta [fluticasone furoate-vilanterol]  Review of Systems   Review of Systems  Respiratory: Positive for cough.   All other systems reviewed and are negative.   Physical Exam Updated Vital Signs BP (!) 170/112 (BP Location: Right Arm)   Pulse 73   Temp 98 F (36.7 C)   Resp (!) 21   LMP 04/08/1974   SpO2 96%   Physical Exam Vitals and nursing note reviewed.  Constitutional:      Appearance: She is well-developed.  HENT:     Head: Normocephalic and atraumatic.  Cardiovascular:     Rate and Rhythm: Normal rate and regular rhythm.     Heart sounds: No murmur heard.   Pulmonary:     Effort: Pulmonary effort is normal. No respiratory distress.     Comments: Frequent coughing. Diffuse wheezes and rhonchi. Abdominal:     Palpations: Abdomen is soft.     Tenderness: There is no abdominal tenderness. There is no guarding or rebound.  Musculoskeletal:        General: No tenderness.     Comments: Trace edema to bilateral lower extremities with multiple bruises to bilateral lower extremities  Skin:    General: Skin is warm and dry.  Neurological:     Mental Status: She is alert and oriented to person, place, and time.  Psychiatric:        Behavior: Behavior normal.     ED Results / Procedures / Treatments   Labs (all labs ordered are listed, but only abnormal results are displayed) Labs Reviewed  COMPREHENSIVE METABOLIC PANEL - Abnormal; Notable for the following components:      Result Value   Glucose, Bld 102 (*)    GFR calc non Af Amer 55 (*)    All other components within normal limits  MAGNESIUM - Abnormal; Notable for the following components:   Magnesium 2.5 (*)    All other components within normal limits  SARS CORONAVIRUS 2 BY  RT PCR (HOSPITAL ORDER, [facility])  CULTURE, BLOOD (ROUTINE X 2)  CULTURE, BLOOD (ROUTINE X 2)  URINE CULTURE  RESPIRATORY PANEL BY PCR  EXPECTORATED SPUTUM ASSESSMENT W REFEX TO RESP CULTURE  LACTIC ACID, PLASMA  CBC WITH DIFFERENTIAL/PLATELET  PHOSPHORUS  LACTIC ACID, PLASMA  URINALYSIS, ROUTINE W REFLEX MICROSCOPIC  CBC WITH DIFFERENTIAL/PLATELET  BASIC METABOLIC PANEL  STREP PNEUMONIAE URINARY ANTIGEN    EKG EKG Interpretation  Date/Time:  Wednesday October 27 2019 20:00:26 EDT Ventricular Rate:  89 PR Interval:    QRS Duration: 170 QT Interval:  437 QTC Calculation: 532 R Axis:   -86 Text Interpretation: Atrial fibrillation Nonspecific IVCD with LAD LVH with secondary repolarization abnormality Confirmed by Quintella Reichert [phone]) on 10/27/2019 8:08:36 PM   Radiology DG Chest 2 View  Result Date: 10/27/2019 CLINICAL DATA:  Productive cough EXAM: CHEST - 2 VIEW COMPARISON:  10/29/2018 FINDINGS: Focal airspace infiltrate is noted within the left lower lobe, possibly infectious in the appropriate clinical setting. Aspiration could also appear similarly. No pneumothorax or pleural effusion. Mild cardiomegaly is unchanged. Thoracic aorta is ectatic. Left subclavian dual lead pacemaker is unchanged. Pulmonary vascularity is normal. Degenerative changes are seen within the thoracolumbar spine. IMPRESSION: Left lower lobe focal pulmonary infiltrate, infection versus aspiration. Electronically Signed   By: Fidela Salisbury MD   On: 10/27/2019 17:57  Procedures Procedures (including critical care time)  Medications Ordered in ED Medications  guaiFENesin (MUCINEX) 12 hr tablet 600 mg (600 mg Oral Not Given 10/27/19 2217)  acetaminophen (TYLENOL) tablet 650 mg (has no administration in time range)    Or  acetaminophen (TYLENOL) suppository 650 mg (has no administration in time range)  ondansetron (ZOFRAN) tablet 4 mg (has no administration in time range)     Or  ondansetron (ZOFRAN) injection 4 mg (has no administration in time range)  cefTRIAXone (ROCEPHIN) 1 g in sodium chloride 0.9 % 100 mL IVPB (has no administration in time range)  doxycycline (VIBRAMYCIN) 100 mg in sodium chloride 0.9 % 250 mL IVPB (has no administration in time range)  ipratropium-albuterol (DUONEB) 0.5-2.5 (3) MG/3ML nebulizer solution 3 mL (has no administration in time range)  albuterol (VENTOLIN HFA) 108 (90 Base) MCG/ACT inhaler 2 puff (2 puffs Inhalation Given 10/27/19 1944)  methylPREDNISolone sodium succinate (SOLU-MEDROL) 125 mg/2 mL injection 60 mg (60 mg Intravenous Given 10/27/19 1942)  cefTRIAXone (ROCEPHIN) 1 g in sodium chloride 0.9 % 100 mL IVPB (1 g Intravenous New Bag/Given 10/27/19 2041)  doxycycline (VIBRA-TABS) tablet 100 mg (100 mg Oral Given 10/27/19 2040)    ED Course  I have reviewed the triage vital signs and the nursing notes.  Pertinent labs & imaging results that were available during my care of the patient were reviewed by me and considered in my medical decision making (see chart for details).    MDM Rules/Calculators/A&P                          Patient with history of COPD, CHF here for evaluation of progressive cough. Chest x-ray concerning for developing pneumonia in the left lung base. Patient with frequent coughing, difficulty speaking due to recurrent cough. She has been treated with azithromycin at home and is continuing to worsen. Due to concern for possible pneumonia she was started on antibiotics for community acquired pneumonia. Plan to admit due to treatment failure of outpatient antibiotics. Patient updated findings of studies recommendation for mission and she is in agreement treatment plan. Hospitalist consulted for admission.  Final Clinical Impression(s) / ED Diagnoses Final diagnoses:  Community acquired pneumonia of left lower lobe of lung    Rx / DC Orders ED Discharge Orders    None       Quintella Reichert,  MD 10/28/19 0020

## 2019-10-27 NOTE — Assessment & Plan Note (Signed)
Today's presentation more likely respiratory infection rather than exacerb of CHF, but watch this.

## 2019-10-28 ENCOUNTER — Encounter (HOSPITAL_COMMUNITY): Payer: Self-pay | Admitting: Internal Medicine

## 2019-10-28 DIAGNOSIS — N1831 Chronic kidney disease, stage 3a: Secondary | ICD-10-CM

## 2019-10-28 DIAGNOSIS — I5032 Chronic diastolic (congestive) heart failure: Secondary | ICD-10-CM

## 2019-10-28 DIAGNOSIS — J181 Lobar pneumonia, unspecified organism: Secondary | ICD-10-CM

## 2019-10-28 LAB — URINALYSIS, ROUTINE W REFLEX MICROSCOPIC
Bilirubin Urine: NEGATIVE
Glucose, UA: NEGATIVE mg/dL
Hgb urine dipstick: NEGATIVE
Ketones, ur: 5 mg/dL — AB
Leukocytes,Ua: NEGATIVE
Nitrite: NEGATIVE
Protein, ur: NEGATIVE mg/dL
Specific Gravity, Urine: 1.013 (ref 1.005–1.030)
pH: 7 (ref 5.0–8.0)

## 2019-10-28 LAB — CBC WITH DIFFERENTIAL/PLATELET
Abs Immature Granulocytes: 0.03 10*3/uL (ref 0.00–0.07)
Basophils Absolute: 0 10*3/uL (ref 0.0–0.1)
Basophils Relative: 0 %
Eosinophils Absolute: 0 10*3/uL (ref 0.0–0.5)
Eosinophils Relative: 0 %
HCT: 42.3 % (ref 36.0–46.0)
Hemoglobin: 13.5 g/dL (ref 12.0–15.0)
Immature Granulocytes: 1 %
Lymphocytes Relative: 11 %
Lymphs Abs: 0.5 10*3/uL — ABNORMAL LOW (ref 0.7–4.0)
MCH: 31.9 pg (ref 26.0–34.0)
MCHC: 31.9 g/dL (ref 30.0–36.0)
MCV: 100 fL (ref 80.0–100.0)
Monocytes Absolute: 0.1 10*3/uL (ref 0.1–1.0)
Monocytes Relative: 2 %
Neutro Abs: 4 10*3/uL (ref 1.7–7.7)
Neutrophils Relative %: 86 %
Platelets: 225 10*3/uL (ref 150–400)
RBC: 4.23 MIL/uL (ref 3.87–5.11)
RDW: 13.3 % (ref 11.5–15.5)
WBC: 4.6 10*3/uL (ref 4.0–10.5)
nRBC: 0 % (ref 0.0–0.2)

## 2019-10-28 LAB — LACTIC ACID, PLASMA: Lactic Acid, Venous: 1.3 mmol/L (ref 0.5–1.9)

## 2019-10-28 LAB — EXPECTORATED SPUTUM ASSESSMENT W GRAM STAIN, RFLX TO RESP C

## 2019-10-28 LAB — BASIC METABOLIC PANEL
Anion gap: 14 (ref 5–15)
BUN: 19 mg/dL (ref 8–23)
CO2: 25 mmol/L (ref 22–32)
Calcium: 9.3 mg/dL (ref 8.9–10.3)
Chloride: 100 mmol/L (ref 98–111)
Creatinine, Ser: 0.96 mg/dL (ref 0.44–1.00)
GFR calc Af Amer: 60 mL/min — ABNORMAL LOW (ref >60)
GFR calc non Af Amer: 52 mL/min — ABNORMAL LOW (ref >60)
Glucose, Bld: 136 mg/dL — ABNORMAL HIGH (ref 70–99)
Potassium: 3.6 mmol/L (ref 3.5–5.1)
Sodium: 139 mmol/L (ref 135–145)

## 2019-10-28 MED ORDER — MONTELUKAST SODIUM 10 MG PO TABS
10.0000 mg | ORAL_TABLET | Freq: Every day | ORAL | Status: DC
  Administered 2019-10-28: 10 mg via ORAL
  Filled 2019-10-28: qty 1

## 2019-10-28 MED ORDER — FUROSEMIDE 40 MG PO TABS
60.0000 mg | ORAL_TABLET | Freq: Every day | ORAL | Status: DC
  Administered 2019-10-28 – 2019-10-29 (×2): 60 mg via ORAL
  Filled 2019-10-28 (×2): qty 1

## 2019-10-28 MED ORDER — METOPROLOL SUCCINATE ER 100 MG PO TB24
100.0000 mg | ORAL_TABLET | Freq: Every day | ORAL | Status: DC
  Administered 2019-10-28 (×2): 100 mg via ORAL
  Filled 2019-10-28 (×2): qty 1

## 2019-10-28 MED ORDER — ATORVASTATIN CALCIUM 40 MG PO TABS
40.0000 mg | ORAL_TABLET | Freq: Every day | ORAL | Status: DC
  Administered 2019-10-28 – 2019-10-29 (×2): 40 mg via ORAL
  Filled 2019-10-28 (×2): qty 1

## 2019-10-28 MED ORDER — RIVAROXABAN 15 MG PO TABS
15.0000 mg | ORAL_TABLET | Freq: Every day | ORAL | Status: DC
  Administered 2019-10-28: 15 mg via ORAL
  Filled 2019-10-28: qty 1

## 2019-10-28 MED ORDER — POTASSIUM CHLORIDE CRYS ER 20 MEQ PO TBCR
20.0000 meq | EXTENDED_RELEASE_TABLET | Freq: Two times a day (BID) | ORAL | Status: DC
  Administered 2019-10-28 – 2019-10-29 (×3): 20 meq via ORAL
  Filled 2019-10-28 (×4): qty 1

## 2019-10-28 MED ORDER — LOSARTAN POTASSIUM 50 MG PO TABS
100.0000 mg | ORAL_TABLET | Freq: Every day | ORAL | Status: DC
  Administered 2019-10-28: 100 mg via ORAL
  Filled 2019-10-28: qty 2

## 2019-10-28 MED ORDER — AMLODIPINE BESYLATE 5 MG PO TABS
5.0000 mg | ORAL_TABLET | Freq: Every day | ORAL | Status: DC
  Administered 2019-10-28 – 2019-10-29 (×2): 5 mg via ORAL
  Filled 2019-10-28 (×2): qty 1

## 2019-10-28 MED ORDER — MELATONIN 5 MG PO TABS
5.0000 mg | ORAL_TABLET | Freq: Once | ORAL | Status: AC
  Administered 2019-10-28: 5 mg via ORAL
  Filled 2019-10-28: qty 1

## 2019-10-28 MED ORDER — MOMETASONE FURO-FORMOTEROL FUM 200-5 MCG/ACT IN AERO
2.0000 | INHALATION_SPRAY | Freq: Two times a day (BID) | RESPIRATORY_TRACT | Status: DC
  Administered 2019-10-28 – 2019-10-29 (×3): 2 via RESPIRATORY_TRACT
  Filled 2019-10-28: qty 8.8

## 2019-10-28 MED ORDER — LEVOTHYROXINE SODIUM 88 MCG PO TABS
88.0000 ug | ORAL_TABLET | Freq: Every day | ORAL | Status: DC
  Administered 2019-10-28 – 2019-10-29 (×2): 88 ug via ORAL
  Filled 2019-10-28 (×2): qty 1

## 2019-10-28 MED ORDER — GABAPENTIN 100 MG PO CAPS
100.0000 mg | ORAL_CAPSULE | Freq: Every day | ORAL | Status: DC
  Administered 2019-10-28 (×2): 100 mg via ORAL
  Filled 2019-10-28 (×2): qty 1

## 2019-10-28 MED ORDER — MAGNESIUM OXIDE 400 (241.3 MG) MG PO TABS
200.0000 mg | ORAL_TABLET | Freq: Every day | ORAL | Status: DC
  Administered 2019-10-28 – 2019-10-29 (×2): 200 mg via ORAL
  Filled 2019-10-28 (×2): qty 1

## 2019-10-28 MED ORDER — FLUTICASONE PROPIONATE 50 MCG/ACT NA SUSP: 2.0000 | Freq: Every day | NASAL | Status: DC | PRN

## 2019-10-28 MED ORDER — PANTOPRAZOLE SODIUM 40 MG PO TBEC
40.0000 mg | DELAYED_RELEASE_TABLET | Freq: Every day | ORAL | Status: DC
  Administered 2019-10-28 – 2019-10-29 (×2): 40 mg via ORAL
  Filled 2019-10-28 (×2): qty 1

## 2019-10-28 MED ORDER — LOSARTAN POTASSIUM 50 MG PO TABS
100.0000 mg | ORAL_TABLET | Freq: Every day | ORAL | Status: DC
  Administered 2019-10-28 (×2): 100 mg via ORAL
  Filled 2019-10-28 (×3): qty 2

## 2019-10-28 MED ORDER — LATANOPROST 0.005 % OP SOLN
1.0000 [drp] | Freq: Every day | OPHTHALMIC | Status: DC
  Administered 2019-10-28: 1 [drp] via OPHTHALMIC
  Filled 2019-10-28: qty 2.5

## 2019-10-28 MED ORDER — PREDNISONE 5 MG PO TABS
5.0000 mg | ORAL_TABLET | Freq: Every day | ORAL | Status: DC
  Administered 2019-10-28 – 2019-10-29 (×2): 5 mg via ORAL
  Filled 2019-10-28 (×2): qty 1

## 2019-10-28 NOTE — ED Notes (Signed)
Transport delayed, critical patient care

## 2019-10-28 NOTE — Progress Notes (Signed)
PROGRESS NOTE  Jody Blake EVO:[id number] DOB: 1925-06-12 DOA: 10/27/2019 PCP: Gayland Curry, DO  Brief History   36yow PMH COPD presented w/ cough, wheezing, dyspnea x1 week. Admitted for pneumonia.   A & P  Left lower lobe pneumonia. CXR w/ infiltrate or aspiration LLL. --no h/o aspiration; very low suspicion based on exam --responding clinically to abx, will continue w/o change  COPD mixed type complicated by GERD, allergic rhinitis --appears stable, continue prednisone taper, montelukast, Dulera, Duonebs.   Permanent atrial fibrillation, pacemaker --continue Xarelto, metoprolol succinate  CKD stage IIIa --stable  Chronic diastolic CHF --appears euvolemic, continue furosemide  Chart Review Seen by Dr. [name] 2/19 --cough, wheeze, sputum. Dx viral exacerbation of COPD. Tx depomedrol, Zpak  Disposition Plan:  Discussion: improved but still w/ sig coughing. Given outpt failure on Zithromax and steroid shot will continue abx inpt given response and reassess in AM. If improving, then home.  Status is: Inpatient  Remains inpatient appropriate because:IV treatments appropriate due to intensity of illness or inability to take PO   Dispo: The patient is from: [facility]              Anticipated d/c is to: [facility]              Anticipated d/c date is: 1 day              Patient currently is not medically stable to d/c.  DVT prophylaxis:   Rivaroxaban (XARELTO) tablet 15 mg  Code Status: DNR Family Communication: none present or requested  Murray Hodgkins, MD  [facility] Direct contact: see [email or web address] (further directions at bottom of note if needed) 7PM-7AM contact night coverage as at bottom of note 10/28/2019, 4:30 PM  LOS: 1 day   Significant Hospital Events   .    Consults:  .    Procedures:  .   Significant Diagnostic Tests:  [name]    Micro Data:  .    Antimicrobials:  .   Interval History/Subjective  Feels better, no wheezing,  but has chronic SOB and still severe cough.  Objective   Vitals:  Vitals:   10/28/19 1338 10/28/19 1508  BP: (!) 172/101   Pulse: 72   Resp: 17   Temp: (!) 97.5 F (36.4 C)   SpO2: 97% 95%    Exam:  Constitutional:   . Appears calm and comfortable, appears younger than stated age ENMT:  . grossly normal hearing  Respiratory:  . CTA bilaterally, no w/r/r.  . Respiratory effort mildly increased. Multiple coughing episodes, severe Cardiovascular:  . RRR, no m/r/g . No LE extremity edema   Psychiatric:  . Mental status o Mood, affect appropriate  I have personally reviewed the following:   Today's Data  . BMP unremarkable . CBC unremarkable  Scheduled Meds: . amLODipine  5 mg Oral Daily  . atorvastatin  40 mg Oral Daily  . furosemide  60 mg Oral Daily  . gabapentin  100 mg Oral QHS  . guaiFENesin  600 mg Oral BID  . ipratropium-albuterol  3 mL Nebulization Q6H  . latanoprost  1 drop Both Eyes QHS  . levothyroxine  88 mcg Oral Q0600  . losartan  100 mg Oral Q1200  . losartan  100 mg Oral QHS  . magnesium oxide  200 mg Oral Daily  . metoprolol succinate  100 mg Oral QHS  . mometasone-formoterol  2 puff Inhalation BID  . montelukast  10 mg Oral QHS  .  pantoprazole  40 mg Oral Daily  . potassium chloride SA  20 mEq Oral BID  . predniSONE  5 mg Oral Q breakfast  . Rivaroxaban  15 mg Oral Q supper   Continuous Infusions: . cefTRIAXone (ROCEPHIN)  IV    . doxycycline (VIBRAMYCIN) IV 100 mg (10/28/19 0849)    Principal Problem:   Lobar pneumonia (HCC) Active Problems:   Essential hypertension   Permanent atrial fibrillation (HCC)   GERD   COPD mixed type (HCC)   Bilateral ocular hypertension   Hypothyroidism   CKD (chronic kidney disease), stage III   Chronic diastolic congestive heart failure ([place])   CAP (community acquired pneumonia)   LOS: 1 day   How to contact the [facility] Attending or Consulting provider [place] or covering provider during after hours  [place], for this patient?  1. Check the care team in [facility] and look for a) attending/consulting TRH provider listed and b) the [facility] team listed 2. Log into [email or web address] and use [place]'s universal password to access. If you do not have the password, please contact the hospital operator. 3. Locate the [facility] provider you are looking for under [facility] and page to a number that you can be directly reached. 4. If you still have difficulty reaching the provider, please page the [facility] (Director on Call) for the Hospitalists listed on amion for assistance.

## 2019-10-29 ENCOUNTER — Telehealth: Payer: Self-pay | Admitting: *Deleted

## 2019-10-29 LAB — URINE CULTURE: Culture: <10000 — AB

## 2019-10-29 MED ORDER — IPRATROPIUM-ALBUTEROL 0.5-2.5 (3) MG/3ML IN SOLN: 3.0000 mL | Freq: Two times a day (BID) | RESPIRATORY_TRACT | Status: DC

## 2019-10-29 MED ORDER — CEFUROXIME AXETIL 500 MG PO TABS
500.0000 mg | ORAL_TABLET | Freq: Two times a day (BID) | ORAL | 0 refills | Status: AC
Start: 2019-10-29 — End: 2019-11-03

## 2019-10-29 MED ORDER — DOXYCYCLINE HYCLATE 100 MG PO CAPS: 100.0000 mg | ORAL_CAPSULE | Freq: Two times a day (BID) | ORAL | 0 refills | Status: AC

## 2019-10-29 NOTE — Evaluation (Signed)
Physical Therapy Evaluation Patient Details Name: Jody Blake MRN: [id number] DOB: 11/27/1925 Today's Date: 10/29/2019   History of Present Illness  86 y.o. female with medical history significant of normocytic anemia, aortic stenosis, COPD, permanent atrial fibrillation, history of tachycardia/bradycardia syndrome, history of complete heart block, history of pacemaker placement, diastolic dysfunction, DDD, history of DVT of LLE, hypertension, hypothyroidism, macular degeneration, glaucoma, osteoporosis/osteoarthritis, pure hyperlipidemia, urolithiasis who is brought to the emergency department due to productive cough of whitish sputum, wheezing and dyspnea for about a week associated with chest tightness, fatigue, decreased appetite and malaise. admitted with pna  Clinical Impression  Patient evaluated by Physical Therapy with no further acute PT needs identified. All education has been completed and the patient has no further questions.  See below for any follow-up Physical Therapy or equipment needs. PT is signing off. Thank you for this referral. Pt pleasant and cooperative. Pt independent amb at her baseline, she is unable to tolerate any balance challenges and attempts to furniture amb d/t decr balance and rapid fatigue with mobility. 3/4 DOE after amb with SpO2=95-97% on RA. Feel pt would benefit from HHPT at ILF to incr activity tolerance and maximize safety .     Follow Up Recommendations Home health PT (at ILF)    Equipment Recommendations  None recommended by PT    Recommendations for Other Services       Precautions / Restrictions Precautions Precautions: Fall Precaution Comments: sats monitored Restrictions Weight Bearing Restrictions: No      Mobility  Bed Mobility Overal bed mobility: Modified Independent                Transfers Overall transfer level: Needs assistance   Transfers: Sit to/from Stand Sit to Stand: Supervision         General transfer  comment: for safety  Ambulation/Gait Ambulation/Gait assistance: Min guard;Supervision Gait Distance (Feet): 160 Feet Assistive device: None Gait Pattern/deviations: Step-through pattern;Decreased stride length;Trunk flexed;Wide base of support     General Gait Details: pt attempting to furniture amb, unsteady gait, incr WOB with distance  above 3/4 DOE, Sats 97% on RA  Stairs            Wheelchair Mobility    Modified Rankin (Stroke Patients Only)       Balance Overall balance assessment: Needs assistance Sitting-balance support: No upper extremity supported;Feet supported Sitting balance-Leahy Scale: Good       Standing balance-Leahy Scale: Fair Standing balance comment: unable to tol challenges, compensatory strategies with amb (wide BOS, UEs in high-guard position at times). drifting with head turns                             Pertinent Vitals/Pain Pain Assessment: No/denies pain    Home Living Family/patient expects to be discharged to:: Private residence (ILF)     Type of Home: Independent living facility Home Access: Level entry     Home Layout: One level Home Equipment: Walker - 2 wheels Additional Comments: pt reports she uses the RW only at night    Prior Function Level of Independence: Independent with assistive device(s);Independent               Hand Dominance        Extremity/Trunk Assessment   Upper Extremity Assessment Upper Extremity Assessment: Generalized weakness    Lower Extremity Assessment Lower Extremity Assessment: Generalized weakness       Communication   Communication: [facility]  Cognition Arousal/Alertness: Awake/alert Behavior During Therapy: WFL for tasks assessed/performed Overall Cognitive Status: Within Functional Limits for tasks assessed                                        General Comments      Exercises     Assessment/Plan    PT Assessment All further PT needs can  be met in the next venue of care  PT Problem List         PT Treatment Interventions      PT Goals (Current goals can be found in the Care Plan section)  Acute Rehab PT Goals Patient Stated Goal: home soon PT Goal Formulation: All assessment and education complete, DC therapy    Frequency     Barriers to discharge        Co-evaluation               AM-PAC PT "6 Clicks" Mobility  Outcome Measure Help needed turning from your back to your side while in a flat bed without using bedrails?: None Help needed moving from lying on your back to sitting on the side of a flat bed without using bedrails?: None Help needed moving to and from a bed to a chair (including a wheelchair)?: None Help needed standing up from a chair using your arms (e.g., wheelchair or bedside chair)?: None Help needed to walk in hospital room?: A Little Help needed climbing 3-5 steps with a railing? : A Little 6 Click Score: 22    End of Session Equipment Utilized During Treatment: Gait belt Activity Tolerance: Patient limited by fatigue Patient left: with call bell/phone within reach;in bed   PT Visit Diagnosis: Unsteadiness on feet (R26.81)    Time: 1210-1230 PT Time Calculation (min) (ACUTE ONLY): 20 min   Charges:   PT Evaluation $PT Eval Low Complexity: [place], PT  Acute Rehab Dept ([place]) [phone] Pager [phone]  10/29/2019   [facility] [place] 10/29/2019, 12:41 PM

## 2019-10-29 NOTE — Discharge Summary (Signed)
Physician Discharge Summary  CAROLIN QUANG UKG:[id number] DOB: [birth year] DOA: 10/27/2019  PCP: Gayland Curry, DO  Admit date: 10/27/2019 Discharge date: 10/29/2019  Recommendations for Outpatient Follow-up:   Left lower lobe pneumonia    Follow-up Information    Deneise Lever, MD Follow up.   Specialty: Pulmonary Disease Why: office will contact you with an appointment Contact information: [place] [address] [place] [address] [phone]                Discharge Diagnoses: Principal diagnosis is #1 1. Left lower lobe pneumonia 2. COPD mixed type complicated by GERD, allergic rhinitis 3. Permanent atrial fibrillation, pacemaker 4. CKD stage IIIa 5. Chronic diastolic CHF  Discharge Condition: improved Disposition: home (return to independent living [facility])  Diet recommendation: heart healthy  Filed Weights   10/28/19 0100  Weight: 60.8 kg    History of present illness:  89yow PMH COPD presented w/ cough, wheezing, dyspnea x1 week. Admitted for pneumonia.   Hospital Course:  Patient was treated w/ ceftriaxone and doxycycline w/ gradual clinical improvement.  Hospitalization was uncomplicated.  Ambulatory referral to pulmonology for follow-up.  Left lower lobe pneumonia. CXR w/ infiltrate or aspiration LLL. --no h/o aspiration; very low suspicion based on exam --responding clinically to abx, complete short course of oral antibiotics as an outpatient.  COPD mixed type complicated by GERD, allergic rhinitis --Stable.  Continue chronic prednisone daily.  Continue montelukast, Symbicort, Ventolin,   Permanent atrial fibrillation, pacemaker --continue Xarelto, metoprolol succinate  CKD stage IIIa --stable  Chronic diastolic CHF --appears euvolemic, continue furosemide  Today's assessment: S: feels better, coughing much less, now nonproductive. Ambulating in room ok, doesn't feel like needs therapy. O: Vitals:  Vitals:   10/29/19  0430 10/29/19 0839  BP: (!) 133/67   Pulse: 66   Resp: 16   Temp: 97.6 F (36.4 C)   SpO2: 94% 98%    Constitutional:  . Appears calm and comfortable, appears better today Respiratory:  . CTA bilaterally, no w/r/r.  . Respiratory effort normal.  . No coughing during interview or exam Cardiovascular:  . RRR, no m/r/g . No LE extremity edema   Psychiatric:  . judgement and insight appear normal . Mental status o Mood, affect appropriate  No new data BC NG thus far UC insig growth Sputum culture pending but c/w resp flora  Discharge Instructions  Discharge Instructions    Ambulatory referral to Pulmonology   Complete by: As directed    Follow-up hospitalization for pneumonia 1 week please   Reason for referral: Other Comment - Follow-up hospitalization for pneumonia 1 week please   Diet - low sodium heart healthy   Complete by: As directed    Discharge instructions   Complete by: As directed    Call your physician or seek immediate medical attention for shortness of breath, pain, worsening cough, fever or worsening of condition.   Increase activity slowly   Complete by: As directed      Allergies as of 10/29/2019      Reactions   Brimonidine Tartrate-timolol Other (See Comments)   REACTION: systemic malaise amigen eye drop   Penicillins Hives   Breo Ellipta [fluticasone Furoate-vilanterol] Other (See Comments)   Ran blood pressure up Increased blood pressure      Medication List    STOP taking these medications   azithromycin 250 MG tablet Commonly known as: ZITHROMAX   benzonatate 200 MG capsule Commonly known as: TESSALON  TAKE these medications   amLODipine 5 MG tablet Commonly known as: NORVASC TAKE 1 TABLET BY MOUTH  DAILY   atorvastatin 40 MG tablet Commonly known as: LIPITOR TAKE 1 TABLET BY MOUTH ONCE DAILY FOR CHOLESTEROL What changed: See the new instructions.   cefUROXime 500 MG tablet Commonly known as: CEFTIN Take 1 tablet (500  mg total) by mouth 2 (two) times daily for 5 days.   doxycycline 100 MG capsule Commonly known as: VIBRAMYCIN Take 1 capsule (100 mg total) by mouth 2 (two) times daily for 5 days.   fluticasone 50 MCG/ACT nasal spray Commonly known as: FLONASE USE 2 SPRAYS EACH NOSTRIL ONCE A DAY AS NEEDED FOR ALLERGIES OR CONGESTION. What changed: See the new instructions.   furosemide 40 MG tablet Commonly known as: LASIX Take 1.5 tablets (60 mg total) by mouth daily. Ok to use an extra dose of 40 mg for weight gain   gabapentin 100 MG capsule Commonly known as: NEURONTIN TAKE 1 CAPSULE BY MOUTH AT  BEDTIME   latanoprost 0.005 % ophthalmic solution Commonly known as: XALATAN Place 1 drop into both eyes at bedtime.   levothyroxine 88 MCG tablet Commonly known as: SYNTHROID TAKE 1 TABLET BY MOUTH  DAILY BEFORE BREAKFAST What changed: See the new instructions.   losartan 100 MG tablet Commonly known as: COZAAR Take 1 tablet (100 mg total) by mouth at bedtime. At bedtime   losartan 100 MG tablet Commonly known as: COZAAR Take 1 tablet (100 mg total) by mouth daily at 12 noon.   LUTEIN PO Take 25 mg by mouth daily.   Magnesium 250 MG Tabs Take 1 tablet by mouth daily.   metoprolol succinate 100 MG 24 hr tablet Commonly known as: TOPROL-XL Take 100 mg by mouth daily.   montelukast 10 MG tablet Commonly known as: SINGULAIR TAKE 1 TABLET BY MOUTH  DAILY   potassium chloride SA 20 MEQ tablet Commonly known as: KLOR-CON TAKE 1 TABLET BY MOUTH TWICE DAILY. What changed: when to take this   predniSONE 5 MG tablet Commonly known as: DELTASONE Take 1 tablet (5 mg total) by mouth daily with breakfast.   PreserVision AREDS 2 Caps Take 1 capsule by mouth in the morning and at bedtime.   Rivaroxaban 15 MG Tabs tablet Commonly known as: Xarelto TAKE 1 TABLET BY MOUTH  DAILY WITH SUPPER What changed:   how much to take  how to take this  when to take this  additional  instructions   Symbicort 160-4.5 MCG/ACT inhaler Generic drug: budesonide-formoterol INHALE 2 PUFFS BY MOUTH  INTO THE LUNGS TWO TIMES  DAILY What changed: See the new instructions.   Ventolin HFA 108 (90 Base) MCG/ACT inhaler Generic drug: albuterol USE 2 PUFFS EVERY 6 HOURS AS NEEDED FOR SHORTNESS OF BREATH AND WHEEZING. What changed: See the new instructions.   vitamin B-12 1000 MCG tablet Commonly known as: CYANOCOBALAMIN Take 1,000 mcg by mouth daily.   Vitamin D 50 MCG (2000 UT) Caps Take 1 capsule by mouth daily.      Allergies  Allergen Reactions  . Brimonidine Tartrate-Timolol Other (See Comments)    REACTION: systemic malaise amigen eye drop  . Penicillins Hives  . Breo Ellipta [Fluticasone Furoate-Vilanterol] Other (See Comments)    Ran blood pressure up Increased blood pressure    The results of significant diagnostics from this hospitalization (including imaging, microbiology, ancillary and laboratory) are listed below for reference.    Significant Diagnostic Studies: DG Chest 2 View  Result Date: 10/27/2019 CLINICAL DATA:  Productive cough EXAM: CHEST - 2 VIEW COMPARISON:  10/29/2018 FINDINGS: Focal airspace infiltrate is noted within the left lower lobe, possibly infectious in the appropriate clinical setting. Aspiration could also appear similarly. No pneumothorax or pleural effusion. Mild cardiomegaly is unchanged. Thoracic aorta is ectatic. Left subclavian dual lead pacemaker is unchanged. Pulmonary vascularity is normal. Degenerative changes are seen within the thoracolumbar spine. IMPRESSION: Left lower lobe focal pulmonary infiltrate, infection versus aspiration. Electronically Signed   By: Fidela Salisbury MD   On: 10/27/2019 17:57    Microbiology: Recent Results (from the past 240 hour(s))  Urine culture     Status: Abnormal   Collection Time: 10/27/19  6:57 PM   Specimen: Urine, Catheterized  Result Value Ref Range Status   Specimen Description    Final    URINE, CATHETERIZED Performed at [facility] [address], [place], [place] [address]    Special Requests   Final    NONE Performed at [facility], [place] [address], [place], [place] [address]    Culture (A)  Final    <10,000 COLONIES/mL INSIGNIFICANT GROWTH Performed at [facility] [address], [place], [place] [address]    Report Status 10/29/2019 FINAL  Final  SARS Coronavirus 2 by RT PCR (hospital order, performed in [facility] hospital lab) Nasopharyngeal Nasopharyngeal Swab     Status: None   Collection Time: 10/27/19  6:59 PM   Specimen: Nasopharyngeal Swab  Result Value Ref Range Status   SARS Coronavirus 2 NEGATIVE NEGATIVE Final    Comment: (NOTE) SARS-CoV-2 target nucleic acids are NOT DETECTED.  The SARS-CoV-2 RNA is generally detectable in upper and lower respiratory specimens during the acute phase of infection. The lowest concentration of SARS-CoV-2 viral copies this assay can detect is 250 copies / mL. A negative result does not preclude SARS-CoV-2 infection and should not be used as the sole basis for treatment or other patient management decisions.  A negative result may occur with improper specimen collection / handling, submission of specimen other than nasopharyngeal swab, presence of viral mutation(s) within the areas targeted by this assay, and inadequate number of viral copies (<250 copies / mL). A negative result must be combined with clinical observations, patient history, and epidemiological information.  Fact Sheet for Patients:   StrictlyIdeas.no  Fact Sheet for Healthcare Providers: BankingDealers.co.za  This test is not yet approved or  cleared by the [place] FDA and has been authorized for detection and/or diagnosis of SARS-CoV-2 by FDA under an Emergency Use Authorization (EUA).  This EUA will remain in effect (meaning this test  can be used) for the duration of the COVID-19 declaration under Section 564(b)(1) of the Act, 21 U.S.C. section 360bbb-3(b)(1), unless the authorization is terminated or revoked sooner.  Performed at [facility], [place] [address], [place], [place] [address]   Blood Culture (routine x 2)     Status: None (Preliminary result)   Collection Time: 10/27/19  7:28 PM   Specimen: BLOOD  Result Value Ref Range Status   Specimen Description   Final    BLOOD BLOOD LEFT FOREARM Performed at [facility] [address], [place], [place] [address]    Special Requests   Final    Blood Culture results may not be optimal due to an inadequate volume of blood received in culture bottles BOTTLES DRAWN AEROBIC AND ANAEROBIC Performed at [facility], [place]  [address] [place], [place] [address]    Culture   Final    NO GROWTH 2 DAYS Performed at [facility] [address] [place], [place] [address]    Report Status PENDING  Incomplete  Blood Culture (routine x 2)     Status: None (Preliminary result)   Collection Time: 10/27/19  7:28 PM   Specimen: BLOOD  Result Value Ref Range Status   Specimen Description   Final    BLOOD BLOOD RIGHT FOREARM Performed at [facility] [address], [place], [place] [address]    Special Requests   Final    BOTTLES DRAWN AEROBIC AND ANAEROBIC Blood Culture adequate volume Performed at [facility] [address] [place], [place] [address]    Culture   Final    NO GROWTH 2 DAYS Performed at [facility] [address], [place], [place] [address]    Report Status PENDING  Incomplete  Culture, sputum-assessment     Status: None   Collection Time: 10/28/19  9:00 AM   Specimen: Expectorated Sputum  Result Value Ref Range Status   Specimen Description EXPECTORATED SPUTUM  Final   Special Requests NONE  Final   Sputum evaluation   Final    THIS  SPECIMEN IS ACCEPTABLE FOR SPUTUM CULTURE Performed at [facility], [place] [address], [place], [place] [address]    Report Status 10/28/2019 FINAL  Final  Culture, respiratory     Status: None (Preliminary result)   Collection Time: 10/28/19  9:00 AM  Result Value Ref Range Status   Specimen Description   Final    EXPECTORATED SPUTUM Performed at [facility] [address], [place], [place] [address]    Special Requests   Final    NONE Reflexed from [id number] Performed at [facility], [place] [address] [place], [place] [address]    Gram Stain   Final    NO WBC SEEN FEW GRAM POSITIVE COCCI FEW GRAM POSITIVE RODS    Culture   Final    Consistent with normal respiratory flora. Performed at [facility], [place] [address] [place], [place] [address]    Report Status PENDING  Incomplete     Labs: Basic Metabolic Panel: Recent Labs  Lab 10/27/19 1928 10/28/19 0028  NA 142 139  K 3.5 3.6  CL 100 100  CO2 29 25  GLUCOSE 102* 136*  BUN 21 19  CREATININE 0.91 0.96  CALCIUM 9.4 9.3  MG 2.5*  --   PHOS 3.0  --    Liver Function Tests: Recent Labs  Lab 10/27/19 1928  AST 35  ALT 26  ALKPHOS 57  BILITOT 1.0  PROT 7.1  ALBUMIN 4.3   CBC: Recent Labs  Lab 10/27/19 1928 10/28/19 0028  WBC 4.9 4.6  NEUTROABS 3.3 4.0  HGB 13.1 13.5  HCT 40.8 42.3  MCV 99.0 100.0  PLT 229 225   Principal Problem:   Lobar pneumonia (HCC) Active Problems:   Essential hypertension   Permanent atrial fibrillation (HCC)   GERD   COPD mixed type (HCC)   Bilateral ocular hypertension   Hypothyroidism   CKD (chronic kidney disease), stage III   Chronic diastolic congestive heart failure ([place])   CAP (community acquired pneumonia)   Time coordinating discharge: 25 minutes  Signed:  Murray Hodgkins, MD  [facility]  10/29/2019, 11:24 AM

## 2019-10-29 NOTE — TOC Transition Note (Addendum)
Transition of Care [facility]) - CM/SW Discharge Note   Patient Details  Name: JACQUITA MULHEARN MRN: [id number] Date of Birth: 1925/09/16  [facility] CM/SW Contact:  Ross Ludwig, LCSW Phone Number: 10/29/2019, 11:35 AM   Clinical Narrative:     Patient is from [facility], patient does not have any TOC needs.  CSW attempted to contact Butch Penny at [facility] to inform her that patient is discharging today, and to see if they can transport her, CSW left two messages on voice mail, awaiting for call back.  12:30pm  CSW received call back from Butch Penny at [facility] stating that they can pick up if patient needs to be, however bedside nurse informed CSW that patient's son will transport her back to [facility].    12:45pm PT saw patient and are recommending [facility], CSW called Butch Penny back to leave a message and find out if she needed copies of the PT order, awaiting call back.   Final next level of care: Other (comment) ([facility]) Barriers to Discharge: No Barriers Identified   Patient Goals and CMS Choice Patient states their goals for this hospitalization and ongoing recovery are:: To return back home to [place] living. CMS Medicare.gov Compare Post Acute Care list provided to:: Patient Choice offered to / list presented to : Patient  Discharge Placement                       Discharge Plan and Services                DME Arranged: N/A                    Social Determinants of Health (SDOH) Interventions     Readmission Risk Interventions No flowsheet data found.

## 2019-10-29 NOTE — Telephone Encounter (Signed)
Transition Care Management Follow-Up Telephone Call   Date discharged and where: 10/29/2019 [place]  How have you been since you were released from the hospital?  A Lot Better  Any patient concerns? No  Items Reviewed:   Meds: Yes  Allergies:Yes  Dietary Changes Reviewed:Yes  Functional Questionnaire:  Independent-I Dependent-D  ADLs:I   Dressing- I    Eating-I   Maintaining continence-I   Transferring-I   Transportation-D   Meal Prep- I   Managing Meds-  I  Confirmed importance and Date/Time of follow-up visits scheduled:November 10, 2019 with Dr. Mariea Clonts.    Confirmed with patient if condition worsens to call PCP or go to the Emergency Dept. Patient was given office number and encouraged to call back with questions or concerns:  Yes

## 2019-10-30 LAB — CULTURE, RESPIRATORY W GRAM STAIN
Culture: NORMAL
Gram Stain: NONE SEEN

## 2019-11-01 ENCOUNTER — Ambulatory Visit: Payer: Medicare Other | Admitting: Nurse Practitioner

## 2019-11-01 LAB — CULTURE, BLOOD (ROUTINE X 2)
Culture: NO GROWTH
Culture: NO GROWTH
Special Requests: ADEQUATE

## 2019-11-10 ENCOUNTER — Encounter: Payer: Self-pay | Admitting: Internal Medicine

## 2019-11-10 ENCOUNTER — Ambulatory Visit (INDEPENDENT_AMBULATORY_CARE_PROVIDER_SITE_OTHER): Payer: Medicare Other | Admitting: Internal Medicine

## 2019-11-10 ENCOUNTER — Other Ambulatory Visit: Payer: Self-pay

## 2019-11-10 VITALS — BP 118/62 | HR 69 | Temp 97.8°F | Ht 63.0 in | Wt 132.0 lb

## 2019-11-10 DIAGNOSIS — J449 Chronic obstructive pulmonary disease, unspecified: Secondary | ICD-10-CM | POA: Diagnosis not present

## 2019-11-10 DIAGNOSIS — I4821 Permanent atrial fibrillation: Secondary | ICD-10-CM | POA: Diagnosis not present

## 2019-11-10 DIAGNOSIS — J181 Lobar pneumonia, unspecified organism: Secondary | ICD-10-CM | POA: Diagnosis not present

## 2019-11-10 MED ORDER — MELATONIN 3 MG PO TABS: 3.0000 mg | ORAL_TABLET | Freq: Every day | ORAL | 3 refills | Status: DC

## 2019-11-10 NOTE — Progress Notes (Signed)
Location:  [facility] clinic Provider: Anthonio Mizzell L. Mariea Clonts, D.O., C.M.D.  Code Status: DNR Goals of Care:  Advanced Directives 11/10/2019  Does Patient Have a Medical Advance Directive? -  Type of Advance Directive [place]  Does patient want to make changes to medical advance directive? No - Guardian declined  Copy of [place] in Chart? -  Would patient like information on creating a medical advance directive? -  Pre-existing out of facility DNR order (yellow form or pink MOST form) -    Chief Complaint  Patient presents with  . Transitions Of Care    discharge date 10/29/2019    HPI: Patient is a 86 y.o. female seen today for hospital follow-up s/p admission from 7/21-23 at [facility] for left lower lobe pneumonia.  She was treated with ceftin and doxycycline with gradual improvement.  She is to f/u with pulmonary.  She remains on her symbicort, montelukast, and is using her ventolin every 6 hours.  Cough returns when 6 hrs is up.  On her chronic prednisone.  At discharge, she completed her 5 more days of doxy and ceftin.  One of them tasted awful.  It was giving her indigestion also.    They gave her melatonin 3mg  nightly for sleep.    She had no problems with her CHF.  Was euvolemic.  Past Medical History:  Diagnosis Date  . Anemia   . Aortic stenosis    a. Echo 09/06/12 EF 55-60%, no WMA, G2DD, Ao valve sclerosis w/ mod stenosis, LA mildly dilated, PA pressure 54mmHg  . Asthmatic bronchitis   . Complete heart block [facility])    s/p permanent pacemaker 06/27/1999 (Battery change 06/2007 and 2016).  s/p AV nodal ablation by Dr [name] 2016.  [name] COPD (chronic obstructive pulmonary disease) (HCC)    Dr. [name]  . DDD (degenerative disc disease)   . Diastolic dysfunction    a. Echo 09/06/12 EF 55-60%, no WMA, G2DD, Ao valve sclerosis w/ mod stenosis, LA mildly dilated, PA pressure 34mmHg  . Diverticulosis   . DVT (deep vein thrombosis) in pregnancy 1954   a.  LLE  . Hypertension   . Hypothyroidism    on medication  . Macular degeneration    Dr. [name]  . Osteoarthrosis, unspecified whether generalized or localized, other specified sites   . PAF (paroxysmal atrial fibrillation) (HCC)    Stopped flecainide, on amiodarone but still has bouts of A FIB (mostly in mornings).   . Pure hypercholesterolemia   . Staghorn calculus    Left    Past Surgical History:  Procedure Laterality Date  . APPENDECTOMY  ~ 1941  . AV NODE ABLATION  05/10/2014  . AV NODE ABLATION N/A 05/10/2014   Procedure: AV NODE ABLATION;  Surgeon: Thompson Grayer, MD;  Location: [facility] CATH LAB;  Service: Cardiovascular;  Laterality: N/A;  . BUNIONECTOMY WITH HAMMERTOE RECONSTRUCTION Bilateral ~ 1990  . CARDIAC PACEMAKER PLACEMENT  06/27/99   Medtronic PM implanted by Dr [name]  . CARDIOVERSION N/A 12/02/2013   Procedure: CARDIOVERSION;  Surgeon: [name], MD;  Location: [facility] ENDOSCOPY;  Service: Cardiovascular;  Laterality: N/A;  . CATARACT EXTRACTION W/ INTRAOCULAR LENS  IMPLANT, BILATERAL Bilateral   . COLONOSCOPY WITH PROPOFOL N/A 04/22/2013   Procedure: COLONOSCOPY WITH PROPOFOL;  Surgeon: [name], MD;  Location: [facility] ENDOSCOPY;  Service: Endoscopy;  Laterality: N/A;  . DILATION AND CURETTAGE OF UTERUS  X 2   "when I was going thru menopause"  .  ESOPHAGOGASTRODUODENOSCOPY (EGD) WITH PROPOFOL N/A 04/22/2013   Procedure: ESOPHAGOGASTRODUODENOSCOPY (EGD) WITH PROPOFOL;  Surgeon: [name], MD;  Location: [facility] ENDOSCOPY;  Service: Endoscopy;  Laterality: N/A;  . HERNIA REPAIR    . INCISIONAL HERNIA REPAIR    . INSERT / REPLACE / REMOVE PACEMAKER  06/2007   "took out the old; put in new"  . INSERT / REPLACE / REMOVE PACEMAKER  05/10/2014   MDT PPM generator change by Dr [name]  . JOINT REPLACEMENT    . PARTIAL NEPHRECTOMY Left 05/1974   stone disease  . PERMANENT PACEMAKER GENERATOR CHANGE N/A 05/10/2014   Procedure: PERMANENT PACEMAKER GENERATOR CHANGE;  Surgeon: Thompson Grayer, MD;  Location: [facility] CATH LAB;  Service: Cardiovascular;  Laterality: N/A;  . TONSILLECTOMY AND ADENOIDECTOMY  1930's  . TOTAL KNEE ARTHROPLASTY Right 2001    Allergies  Allergen Reactions  . Brimonidine Tartrate-Timolol Other (See Comments)    REACTION: systemic malaise amigen eye drop  . Penicillins Hives  . Breo Ellipta [Fluticasone Furoate-Vilanterol] Other (See Comments)    Ran blood pressure up Increased blood pressure    Outpatient Encounter Medications as of 11/10/2019  Medication Sig  . amLODipine (NORVASC) 5 MG tablet TAKE 1 TABLET BY MOUTH  DAILY  . atorvastatin (LIPITOR) 40 MG tablet TAKE 1 TABLET BY MOUTH ONCE DAILY FOR CHOLESTEROL  . Cholecalciferol (VITAMIN D) 2000 units CAPS Take 1 capsule by mouth daily.  . fluticasone (FLONASE) 50 MCG/ACT nasal spray USE 2 SPRAYS EACH NOSTRIL ONCE A DAY AS NEEDED FOR ALLERGIES OR CONGESTION.  . furosemide (LASIX) 40 MG tablet Take 1.5 tablets (60 mg total) by mouth daily. Ok to use an extra dose of 40 mg for weight gain  . gabapentin (NEURONTIN) 100 MG capsule TAKE 1 CAPSULE BY MOUTH AT  BEDTIME  . latanoprost (XALATAN) 0.005 % ophthalmic solution Place 1 drop into both eyes at bedtime.  [name] levothyroxine (SYNTHROID) 88 MCG tablet TAKE 1 TABLET BY MOUTH  DAILY BEFORE BREAKFAST  . losartan (COZAAR) 100 MG tablet Take 1 tablet (100 mg total) by mouth at bedtime. At bedtime  . losartan (COZAAR) 100 MG tablet Take 1 tablet (100 mg total) by mouth daily at 12 noon.  . LUTEIN PO Take 25 mg by mouth daily.   . Magnesium 250 MG TABS Take 1 tablet by mouth daily.  . metoprolol succinate (TOPROL-XL) 100 MG 24 hr tablet Take 100 mg by mouth daily.  . montelukast (SINGULAIR) 10 MG tablet TAKE 1 TABLET BY MOUTH  DAILY  . Multiple Vitamins-Minerals (PRESERVISION AREDS 2) CAPS Take 1 capsule by mouth in the morning and at bedtime.   . potassium chloride SA (K-DUR) 20 MEQ tablet TAKE 1 TABLET BY MOUTH TWICE DAILY.  [name] predniSONE (DELTASONE) 5 MG  tablet Take 1 tablet (5 mg total) by mouth daily with breakfast.  . Rivaroxaban (XARELTO) 15 MG TABS tablet TAKE 1 TABLET BY MOUTH  DAILY WITH SUPPER  . SYMBICORT 160-4.5 MCG/ACT inhaler INHALE 2 PUFFS BY MOUTH  INTO THE LUNGS TWO TIMES  DAILY  . VENTOLIN HFA 108 (90 Base) MCG/ACT inhaler USE 2 PUFFS EVERY 6 HOURS AS NEEDED FOR SHORTNESS OF BREATH AND WHEEZING.  . vitamin B-12 (CYANOCOBALAMIN) 1000 MCG tablet Take 1,000 mcg by mouth daily.   No facility-administered encounter medications on file as of 11/10/2019.    Review of Systems:  Review of Systems  Constitutional: Positive for malaise/fatigue. Negative for chills and fever.  HENT: Positive for hearing loss. Negative for congestion  and sore throat.   Eyes: Positive for blurred vision.  Respiratory: Positive for cough, sputum production and shortness of breath. Negative for wheezing.        All improving  Cardiovascular: Positive for leg swelling. Negative for chest pain and palpitations.  Gastrointestinal: Negative for abdominal pain.  Genitourinary: Negative for dysuria.  Musculoskeletal: Negative for falls.  Neurological: Negative for dizziness and loss of consciousness.       Chronic unsteadiness  Endo/Heme/Allergies: Bruises/bleeds easily.  Psychiatric/Behavioral: Negative for depression and memory loss. The patient is not nervous/anxious.     Health Maintenance  Topic Date Due  . DEXA SCAN  Never done  . INFLUENZA VACCINE  11/07/2019  . TETANUS/TDAP  02/23/2029  . COVID-19 Vaccine  Completed  . PNA vac Low Risk Adult  Completed    Physical Exam: Vitals:   11/10/19 1459  BP: 118/62  Pulse: 69  Temp: 97.8 F (36.6 C)  TempSrc: Temporal  SpO2: 96%  Weight: 132 lb (59.9 kg)  Height: 5\' 3"  (1.6 m)   Body mass index is 23.38 kg/m. Physical Exam Vitals reviewed.  Constitutional:      General: She is not in acute distress.    Appearance: Normal appearance. She is not toxic-appearing.  HENT:     Head:  Normocephalic and atraumatic.  Cardiovascular:     Rate and Rhythm: Rhythm irregular.     Heart sounds: No murmur heard.   Pulmonary:     Effort: Pulmonary effort is normal.     Breath sounds: Rhonchi present. No wheezing or rales.  Abdominal:     General: Bowel sounds are normal.  Musculoskeletal:     Cervical back: Neck supple.     Right lower leg: Edema present.     Left lower leg: Edema present.     Comments: Mild edema at ankles themselves, but not into legs or feet  Skin:    Comments: Left arm ecchymoses from IVs  Neurological:     General: No focal deficit present.     Mental Status: She is alert and oriented to person, place, and time.     Gait: Gait abnormal.  Psychiatric:        Mood and Affect: Mood normal.        Behavior: Behavior normal.        Thought Content: Thought content normal.        Judgment: Judgment normal.     Labs reviewed: Basic Metabolic Panel: Recent Labs    09/20/19 1622 10/27/19 1928 10/28/19 0028  NA 140 142 139  K 4.3 3.5 3.6  CL 99 100 100  CO2 27 29 25   GLUCOSE 101* 102* 136*  BUN 19 21 19   CREATININE 1.20* 0.91 0.96  CALCIUM 9.7 9.4 9.3  MG  --  2.5*  --   PHOS  --  3.0  --    Liver Function Tests: Recent Labs    10/27/19 1928  AST 35  ALT 26  ALKPHOS 57  BILITOT 1.0  PROT 7.1  ALBUMIN 4.3   No results for input(s): LIPASE, AMYLASE in the last 8760 hours. No results for input(s): AMMONIA in the last 8760 hours. CBC: Recent Labs    07/03/19 0039 10/27/19 1928 10/28/19 0028  WBC 6.7 4.9 4.6  NEUTROABS 4.5 3.3 4.0  HGB 13.9 13.1 13.5  HCT 43.6 40.8 42.3  MCV 99.3 99.0 100.0  PLT 268 229 225   Lipid Panel: No results for input(s): CHOL, HDL, LDLCALC, TRIG,  CHOLHDL, LDLDIRECT in the last 8760 hours. Lab Results  Component Value Date   HGBA1C 5.7 (H) 06/25/2017    Procedures since last visit: DG Chest 2 View  Result Date: 10/27/2019 CLINICAL DATA:  Productive cough EXAM: CHEST - 2 VIEW COMPARISON:   10/29/2018 FINDINGS: Focal airspace infiltrate is noted within the left lower lobe, possibly infectious in the appropriate clinical setting. Aspiration could also appear similarly. No pneumothorax or pleural effusion. Mild cardiomegaly is unchanged. Thoracic aorta is ectatic. Left subclavian dual lead pacemaker is unchanged. Pulmonary vascularity is normal. Degenerative changes are seen within the thoracolumbar spine. IMPRESSION: Left lower lobe focal pulmonary infiltrate, infection versus aspiration. Electronically Signed   By: Fidela Salisbury MD   On: 10/27/2019 17:57    Assessment/Plan 1. Lobar pneumonia ([place]) -clinically improved but still rhonchorous on left where infiltrate was -repeat pCXR 2 view to see if improved, but should lag behind so may not be helpful -if still does show infiltrate, recommend earlier f/u with Dr. [name] (currentlly scheduled in nov!)  2. Permanent atrial fibrillation (HCC) -no recent difficulty, has pacemaker, cont also on toprol xl and xarelto for hypercoagulable state  3. COPD mixed type ([place]) -continue on symbicort, singulair, ventolin hfa regularly right now until better, prednisone daily, f/u with pulmonary sooner  Labs/tests ordered:  2 view pCXR Next appt:  01/12/2020   Renesme Kerrigan L. Neithan Day, D.O. [facility] [address], [place] [address] Cell Phone (Mon-Fri 8am-5pm):  [phone] On Call:  [phone] & follow prompts after 5pm & weekends Office Phone:  [phone] Office Fax:  [phone]

## 2019-11-11 ENCOUNTER — Telehealth: Payer: Self-pay

## 2019-11-11 ENCOUNTER — Encounter: Payer: Self-pay | Admitting: Internal Medicine

## 2019-11-11 DIAGNOSIS — R05 Cough: Secondary | ICD-10-CM | POA: Diagnosis not present

## 2019-11-11 NOTE — Telephone Encounter (Signed)
Per Dr. Mariea Clonts clinically improved will not tx with more abx. Still resolving recommend she see pulmonary sooner in F/U.... called patient and she stated she will follow up with pulmonary.

## 2019-11-15 NOTE — Telephone Encounter (Signed)
Patient called and stated that she got an appointment with Dr. Candiss Norse Office [facility]) with the PA for 11/23/2019

## 2019-11-22 NOTE — Progress Notes (Signed)
@Patient  ID: Jody Blake, female    DOB: 07/20/1925, 85 y.o.   MRN: [id number]  Chief Complaint  Patient presents with  . Follow-up    Referring provider: Gayland Curry, DO  HPI: 86 year old female, never smoked. PMH significant for COPD mixed type, CAP, left pleural effusion, HTN, DVT, CHF, AFIB, TIA, GERD, CKD, macular degeneration. Patient of Dr. [name], last seen on 10/25/19. Admitted to the hospital for community acquired pneumonia. Dx left lower lobe pneumonia, treated with ceftriaxone, Doxycycline. Discharged on 10/29/19. Maintained on Symbicort, montelukast and Ventolin hfa.   11/23/2019- Interim hx Patient presents today for hospital follow-up. She thinks she is doing ok. She reports having less energy.  Her cough has improved, still occasionally gets up some phlegm. Appetite is decreased but appears baseline. She is still eating meals. Thinks she may occasionally aspirate but not often. Denies significant shortnes of breath. She gets a little out of breath when she does things. She walks in the halls at [employer] and occasionally will need to sit and rest. Wheezing has resolved. She has been using rescue inahler every 6 hours as instructed upon discharged. She was seen by Dr. Mariea Clonts at [place]. She had a portable CXR done there, results not available in mychart.   Imaging: 10/27/19 CXR: Left lower lobe focal pulmonary infiltrate, infection versus aspiration.  Allergies  Allergen Reactions  . Brimonidine Tartrate-Timolol Other (See Comments)    REACTION: systemic malaise amigen eye drop  . Penicillins Hives  . Breo Ellipta [Fluticasone Furoate-Vilanterol] Other (See Comments)    Ran blood pressure up Increased blood pressure    Immunization History  Administered Date(s) Administered  . Influenza Split 01/07/2011  . Influenza Whole 01/16/2009, 01/23/2010  . Influenza, High Dose Seasonal PF 01/06/2013, 01/15/2019  . Influenza,inj,Quad PF,6+ Mos 02/17/2015, 01/30/2018  .  Influenza,inj,quad, With Preservative 01/06/2017  . Influenza-Unspecified 01/06/2014, 02/01/2016  . Moderna SARS-COVID-2 Vaccination 04/20/2019, 05/18/2019  . Pneumococcal Conjugate-13 02/24/2019  . Pneumococcal-Unspecified 11/23/2013  . Tdap 02/24/2019  . Zoster Recombinat (Shingrix) 03/13/2017, 05/20/2017    Past Medical History:  Diagnosis Date  . Anemia   . Aortic stenosis    a. Echo 09/06/12 EF 55-60%, no WMA, G2DD, Ao valve sclerosis w/ mod stenosis, LA mildly dilated, PA pressure 48mmHg  . Asthmatic bronchitis   . Complete heart block [facility])    s/p permanent pacemaker 06/27/1999 (Battery change 06/2007 and 2016).  s/p AV nodal ablation by Dr [name] 2016.  [name] COPD (chronic obstructive pulmonary disease) (HCC)    Dr. [name]  . DDD (degenerative disc disease)   . Diastolic dysfunction    a. Echo 09/06/12 EF 55-60%, no WMA, G2DD, Ao valve sclerosis w/ mod stenosis, LA mildly dilated, PA pressure 106mmHg  . Diverticulosis   . DVT (deep vein thrombosis) in pregnancy 1954   a. LLE  . Hypertension   . Hypothyroidism    on medication  . Macular degeneration    Dr. [name]  . Osteoarthrosis, unspecified whether generalized or localized, other specified sites   . PAF (paroxysmal atrial fibrillation) (HCC)    Stopped flecainide, on amiodarone but still has bouts of A FIB (mostly in mornings).   . Pure hypercholesterolemia   . Staghorn calculus    Left    Tobacco History: Social History   Tobacco Use  Smoking Status Never Smoker  Smokeless Tobacco Never Used   Counseling given: Not Answered   Outpatient Medications Prior to Visit  Medication Sig Dispense Refill  . amLODipine (  NORVASC) 5 MG tablet TAKE 1 TABLET BY MOUTH  DAILY 90 tablet 3  . atorvastatin (LIPITOR) 40 MG tablet TAKE 1 TABLET BY MOUTH ONCE DAILY FOR CHOLESTEROL 90 tablet 1  . Cholecalciferol (VITAMIN D) 2000 units CAPS Take 1 capsule by mouth daily.    . fluticasone (FLONASE) 50 MCG/ACT nasal spray USE 2 SPRAYS EACH  NOSTRIL ONCE A DAY AS NEEDED FOR ALLERGIES OR CONGESTION. 16 g PRN  . furosemide (LASIX) 40 MG tablet Take 1.5 tablets (60 mg total) by mouth daily. Ok to use an extra dose of 40 mg for weight gain 90 tablet 1  . gabapentin (NEURONTIN) 100 MG capsule TAKE 1 CAPSULE BY MOUTH AT  BEDTIME 90 capsule 3  . latanoprost (XALATAN) 0.005 % ophthalmic solution Place 1 drop into both eyes at bedtime.    [name] levothyroxine (SYNTHROID) 88 MCG tablet TAKE 1 TABLET BY MOUTH  DAILY BEFORE BREAKFAST 90 tablet 3  . losartan (COZAAR) 100 MG tablet Take 1 tablet (100 mg total) by mouth at bedtime. At bedtime 90 tablet 3  . losartan (COZAAR) 100 MG tablet Take 1 tablet (100 mg total) by mouth daily at 12 noon. 30 tablet 11  . LUTEIN PO Take 25 mg by mouth daily.     . Magnesium 250 MG TABS Take 1 tablet by mouth daily.    . melatonin 3 MG TABS tablet Take 1 tablet (3 mg total) by mouth at bedtime. 90 tablet 3  . metoprolol succinate (TOPROL-XL) 100 MG 24 hr tablet Take 100 mg by mouth daily.    . montelukast (SINGULAIR) 10 MG tablet TAKE 1 TABLET BY MOUTH  DAILY 90 tablet 3  . Multiple Vitamins-Minerals (PRESERVISION AREDS 2) CAPS Take 1 capsule by mouth in the morning and at bedtime.     . potassium chloride SA (K-DUR) 20 MEQ tablet TAKE 1 TABLET BY MOUTH TWICE DAILY. 60 tablet 6  . predniSONE (DELTASONE) 5 MG tablet Take 1 tablet (5 mg total) by mouth daily with breakfast. 90 tablet 1  . Rivaroxaban (XARELTO) 15 MG TABS tablet TAKE 1 TABLET BY MOUTH  DAILY WITH SUPPER 10 tablet 1  . SYMBICORT 160-4.5 MCG/ACT inhaler INHALE 2 PUFFS BY MOUTH  INTO THE LUNGS TWO TIMES  DAILY 30.6 g 3  . VENTOLIN HFA 108 (90 Base) MCG/ACT inhaler USE 2 PUFFS EVERY 6 HOURS AS NEEDED FOR SHORTNESS OF BREATH AND WHEEZING. 18 g 0  . vitamin B-12 (CYANOCOBALAMIN) 1000 MCG tablet Take 1,000 mcg by mouth daily.     No facility-administered medications prior to visit.    Review of Systems  Review of Systems  Constitutional: Negative.     HENT: Negative.   Respiratory: Positive for cough. Negative for shortness of breath and wheezing.        DOE  Cardiovascular: Positive for leg swelling. Negative for chest pain and palpitations.   Physical Exam  BP 112/74 (BP Location: Left Arm, Cuff Size: Normal)   Pulse 85   Temp 97.6 F (36.4 C) (Oral)   Ht 5\' 3"  (1.6 m)   Wt 132 lb 6.4 oz (60.1 kg)   LMP 04/08/1974   SpO2 97%   BMI 23.45 kg/m  Physical Exam Constitutional:      Appearance: Normal appearance.  HENT:     Head: Normocephalic and atraumatic.     Mouth/Throat:     Mouth: Mucous membranes are moist.     Pharynx: Oropharynx is clear.  Cardiovascular:     Rate and  Rhythm: Normal rate and regular rhythm.     Comments: +1 pedal edema Pulmonary:     Effort: Pulmonary effort is normal.     Breath sounds: Normal breath sounds.  Skin:    General: Skin is warm and dry.  Neurological:     General: No focal deficit present.     Mental Status: She is alert and oriented to person, place, and time. Mental status is at baseline.  Psychiatric:        Mood and Affect: Mood normal.        Behavior: Behavior normal.        Thought Content: Thought content normal.        Judgment: Judgment normal.      Lab Results:  CBC    Component Value Date/Time   WBC 4.6 10/28/2019 0028   RBC 4.23 10/28/2019 0028   HGB 13.5 10/28/2019 0028   HGB 13.7 01/12/2019 1219   HCT 42.3 10/28/2019 0028   HCT 41.4 01/12/2019 1219   PLT 225 10/28/2019 0028   PLT 298 01/12/2019 1219   MCV 100.0 10/28/2019 0028   MCV 96 01/12/2019 1219   MCH 31.9 10/28/2019 0028   MCHC 31.9 10/28/2019 0028   RDW 13.3 10/28/2019 0028   RDW 12.9 01/12/2019 1219   LYMPHSABS 0.5 (L) 10/28/2019 0028   MONOABS 0.1 10/28/2019 0028   EOSABS 0.0 10/28/2019 0028   BASOSABS 0.0 10/28/2019 0028    BMET    Component Value Date/Time   NA 139 10/28/2019 0028   NA 140 09/20/2019 1622   K 3.6 10/28/2019 0028   CL 100 10/28/2019 0028   CO2 25 10/28/2019  0028   GLUCOSE 136 (H) 10/28/2019 0028   BUN 19 10/28/2019 0028   BUN 19 09/20/2019 1622   CREATININE 0.96 10/28/2019 0028   CALCIUM 9.3 10/28/2019 0028   GFRNONAA 52 (L) 10/28/2019 0028   GFRAA 60 (L) 10/28/2019 0028    BNP    Component Value Date/Time   BNP 255.1 (H) 11/14/2016 1636    ProBNP    Component Value Date/Time   PROBNP 1,334 (H) 01/12/2019 1219   PROBNP 226.0 (H) 06/15/2015 1133    Imaging: DG Chest 2 View  Result Date: 10/27/2019 CLINICAL DATA:  Productive cough EXAM: CHEST - 2 VIEW COMPARISON:  10/29/2018 FINDINGS: Focal airspace infiltrate is noted within the left lower lobe, possibly infectious in the appropriate clinical setting. Aspiration could also appear similarly. No pneumothorax or pleural effusion. Mild cardiomegaly is unchanged. Thoracic aorta is ectatic. Left subclavian dual lead pacemaker is unchanged. Pulmonary vascularity is normal. Degenerative changes are seen within the thoracolumbar spine. IMPRESSION: Left lower lobe focal pulmonary infiltrate, infection versus aspiration. Electronically Signed   By: Fidela Salisbury MD   On: 10/27/2019 17:57     Assessment & Plan:   CAP (community acquired pneumonia) - Admitted July 21-23, 2021 for CAP, treated with ceftriaxone and doxycycline - She is clinically doing better, had a portable CXR at [facility] ordered by Dr. Mariea Clonts. ?Worsening pulmonary infiltrate left lower lobe or no interval improvement. Results not available in MyChart to review. Recommend repeat CXR today. Do not recommend further abx as she is doing well. Likely may take several weeks to clear on imaging.   COPD mixed type ([place]) - No signs of AECOPD - Continue Symbicort 160 two puffs twice daily and PRN ventolin hfa 2 puffs q4-6 hours for shortness of breath/wheezing    Martyn Ehrich, NP 11/23/2019

## 2019-11-23 ENCOUNTER — Ambulatory Visit (INDEPENDENT_AMBULATORY_CARE_PROVIDER_SITE_OTHER): Payer: Medicare Other | Admitting: Primary Care

## 2019-11-23 ENCOUNTER — Ambulatory Visit (INDEPENDENT_AMBULATORY_CARE_PROVIDER_SITE_OTHER): Payer: Medicare Other

## 2019-11-23 ENCOUNTER — Encounter: Payer: Self-pay | Admitting: Primary Care

## 2019-11-23 ENCOUNTER — Other Ambulatory Visit: Payer: Self-pay

## 2019-11-23 VITALS — BP 112/74 | HR 85 | Temp 97.6°F | Ht 63.0 in | Wt 132.4 lb

## 2019-11-23 DIAGNOSIS — I517 Cardiomegaly: Secondary | ICD-10-CM | POA: Diagnosis not present

## 2019-11-23 DIAGNOSIS — J189 Pneumonia, unspecified organism: Secondary | ICD-10-CM

## 2019-11-23 DIAGNOSIS — J90 Pleural effusion, not elsewhere classified: Secondary | ICD-10-CM | POA: Diagnosis not present

## 2019-11-23 DIAGNOSIS — J449 Chronic obstructive pulmonary disease, unspecified: Secondary | ICD-10-CM

## 2019-11-23 NOTE — Assessment & Plan Note (Signed)
-   No signs of AECOPD - Continue Symbicort 160 two puffs twice daily and PRN ventolin hfa 2 puffs q4-6 hours for shortness of breath/wheezing

## 2019-11-23 NOTE — Assessment & Plan Note (Addendum)
-   Admitted July 21-23, 2021 for CAP, treated with ceftriaxone and doxycycline - She is clinically doing better, had a portable CXR at [facility] ordered by Dr. Mariea Clonts. ?Worsening pulmonary infiltrate left lower lobe or no interval improvement. Results not available in MyChart to review. Recommend repeat CXR today. Do not recommend further abx as she is doing well. Likely may take several weeks to clear on imaging.

## 2019-11-23 NOTE — Patient Instructions (Addendum)
Nice meeting you today Jody Blake, you looked well  Recommendations: - Repeat CXR today- we will call your home number with results later today or tomorrow - Continue Symbicort 160 two puffs twice daily - Use Albuterol rescue inhaler every 4-6 hours only as needed for shortness of breath or wheezing  - Follow aspiration precautions  Follow-up: - 6 months with Dr. [name]   Aspiration Precautions, Adult Aspiration is the breathing in (inhalation) of a liquid or object into the lungs. Things that can be inhaled into the lungs include:  Food.  Any type of liquid, such as drinks or saliva.  Stomach contents, such as vomit or stomach acid. What are the signs of aspiration? Signs of aspiration include:  Coughing after swallowing food or liquids.  Clearing the throat often while eating.  Trouble breathing. This may include: ? Breathing quickly. ? Breathing very slowly. ? Loud breathing. ? Rumbling sounds from the lungs while breathing.  Coughing up phlegm (sputum) that: ? Is yellow, tan, or green. ? Has pieces of food in it. ? Is bad-smelling.  Having a hoarse, barky cough.  Not being able to speak.  A hoarse voice.  Drooling while eating.  A feeling of fullness in the throat or a feeling that something is stuck in the throat.  Choking often.  Having a runny noise while eating.  Coughing when lying down or having to sit up quickly after lying down.  A change in skin color. The skin may look red or blue.  Fever.  Watery eyes.  Pain in the chest or back.  A pained look on the face. What are the complications of aspiration? Complications of aspiration include:  Losing weight because the person is not absorbing needed nutrients.  Loss of enjoyment and the social benefits of eating.  Choking.  Lung irritation, if someone aspirates acidic food or drinks.  Lung infection (pneumonia).  Collection of infected liquid (pus) in the lungs (lung abscess). In  serious cases, death can occur. What can I do to prevent aspiration? Caring for someone who has a feeding tube If you are caring for someone who has a feeding tube who cannot eat or drink safely through his or her mouth:  Keep the person in an upright position as much as possible.  Do not lay the person flat if he or she is getting continuous feedings. If you need to lay the person flat for any reason, turn the feeding pump off.  Check feeding tube residuals as told by your health care provider. Ask your health care provider what residual amount is too high. Caring for someone who can eat and drink safely by mouth If you are caring for someone who can eat and drink safely through his or her mouth:  Have the person sit in an upright position when eating food or drinking fluids. This can be done in two ways: ? Have the person sit up in a chair. ? If sitting in a chair is not possible, position the person in bed so he or she is upright.  Remind the person to eat slowly and chew well. Make sure the person is awake and alert while eating.  Do not distract the person. This is especially important for people with thinking or memory (cognitive) problems.  Allow foods to cool. Hot foods may be more difficult to swallow.  Provide small meals more frequently, instead of 3 large meals. This may reduce fatigue during eating.  Check the person's mouth thoroughly for  leftover food after eating.  Keep the person sitting upright for 30-45 minutes after eating.  Do not serve food or drink during 2 hours or more before bedtime. General instructions Follow these general guidelines to prevent aspiration in someone who can eat and drink safely by mouth:  Never put food or liquids in the mouth of a person who is not fully alert.  Feed small amounts of food. Do not force feed.  For a person who is on a diet for swallowing difficulty (dysphagia diet), follow the recommended food and drink consistency.  For example, in dysphagia diet level 1, thicken liquids to pudding-like consistency.  Use as little water as possible when brushing the person's teeth or cleaning his or her mouth.  Provide oral care before and after meals.  Use adaptive devices such as cut-out cups, straws, or utensils as told by the health care provider.  Crush pills and put them in soft food such as pudding or ice cream. Some pills should not be crushed. Check with the health care provider before crushing any medicine. Contact a health care provider if:  The person has a feeding tube, and the feeding tube residual amount is too high.  The person has a fever.  The person tries to avoid food or water, such as refusing to eat, drink, or be fed, or is eating less than normal.  The person may have aspirated food or liquid.  You notice warning signs, such as choking or coughing, when the person eats or drinks. Get help right away if:  The person has trouble breathing or starts to breathe quickly.  The person is breathing very slowly or stops breathing.  The person coughs a lot after eating or drinking.  The person has a long-lasting (chronic) cough.  The person coughs up thick, yellow, or tan sputum.  If someone is choking on food or an object, perform the Heimlich maneuver (abdominal thrusts).  The person has symptoms of pneumonia, such as: ? Coughing a lot. ? Coughing up mucus with a bad smell or blood in it. ? Feeling short of breath. ? Complaining of chest pain. ? Sweating, fever, and chills. ? Feeling tired. ? Complaining of trouble breathing. ? Wheezing.  The person cannot stop choking.  The person is unable to breathe, turns blue, faints, or seems confused. These symptoms may represent a serious problem that is an emergency. Do not wait to see if the symptoms will go away. Get medical help right away. Call your local emergency services (911 in the [place]  Summary  Aspiration is the breathing in  (inhalation) of a liquid or object into the lungs. Things that can be inhaled into the lungs include food, liquids, saliva, or stomach contents.  Aspiration can cause pneumonia or choking.  One sign of aspiration is coughing after swallowing food or liquids.  Contact a health care provider if you notice signs of aspiration. This information is not intended to replace advice given to you by your health care provider. Make sure you discuss any questions you have with your health care provider. Document Revised: 03/07/2017 Document Reviewed: 12/21/2015 Elsevier Patient Education  [place].

## 2019-11-24 NOTE — Progress Notes (Signed)
Please let patient know previous pneumonia has resolved

## 2019-11-26 ENCOUNTER — Other Ambulatory Visit: Payer: Medicare Other

## 2019-11-29 NOTE — Progress Notes (Signed)
CARDIOLOGY OFFICE NOTE  Date:  12/07/2019    Jody Blake Date of Birth: [birth year] Medical Record #[id number]  PCP:  Gayland Curry, DO  Cardiologist:  Marisa Cyphers    Chief Complaint  Patient presents with  . Follow-up    Seen for Dr. Marlou Porch    History of Present Illness: Jody Blake is a 86 y.o. female who presents today for a follow up visit. Seen for Dr. Marlou Porch. Sees Dr. [name] for EP.   She has a history of CHB - s/p PPM - followed by Dr. [name]. Has also had AV nodal ablation secondary to AF that was not controllable with antiarrhythmics. Other issues include HTN and progressive shortness of breath.   Saw Dr. Marlou Porch back in April - there had been a medicine mix up due to her worsening eye sight. She had been missing her Metoprolol - now back on and this dose was increased. She remains on higher than typical doses of ARB as well and this is chronic.  She does use extra lasix as needed chronically. I last saw her back in mid June - she had had several occasions of using extra Lasix. Trying to keep her weight around 132#. Typically does not feel well when gets up in the mornings. Coughs a lot of night.    Comes in today. Here alone this morning. Has had pneumonia since last seen - had to reschedule this visit with [place]. Was there for 2 nights. Much better now. Still with some fatigue. Back walking but not as much as she was - she is splitting this up to twice a day. Follow up CXR earlier this month was fine and showed resolution. Swelling is stable. She is trying to get new support stockings. No chest pain. She wishes her vision and hearing were better. No falls. No bleeding. Has not used any extra Lasix. Labs from her hospitalization noted.   Past Medical History:  Diagnosis Date  . Anemia   . Aortic stenosis    a. Echo 09/06/12 EF 55-60%, no WMA, G2DD, Ao valve sclerosis w/ mod stenosis, LA mildly dilated, PA pressure 43mmHg  . Asthmatic bronchitis   . Complete  heart block [facility])    s/p permanent pacemaker 06/27/1999 (Battery change 06/2007 and 2016).  s/p AV nodal ablation by Dr [name] 2016.  [name] COPD (chronic obstructive pulmonary disease) (HCC)    Dr. [name]  . DDD (degenerative disc disease)   . Diastolic dysfunction    a. Echo 09/06/12 EF 55-60%, no WMA, G2DD, Ao valve sclerosis w/ mod stenosis, LA mildly dilated, PA pressure 47mmHg  . Diverticulosis   . DVT (deep vein thrombosis) in pregnancy 1954   a. LLE  . Hypertension   . Hypothyroidism    on medication  . Macular degeneration    Dr. [name]  . Osteoarthrosis, unspecified whether generalized or localized, other specified sites   . PAF (paroxysmal atrial fibrillation) (HCC)    Stopped flecainide, on amiodarone but still has bouts of A FIB (mostly in mornings).   . Pure hypercholesterolemia   . Staghorn calculus    Left    Past Surgical History:  Procedure Laterality Date  . APPENDECTOMY  ~ 1941  . AV NODE ABLATION  05/10/2014  . AV NODE ABLATION N/A 05/10/2014   Procedure: AV NODE ABLATION;  Surgeon: Thompson Grayer, MD;  Location: [facility] CATH LAB;  Service: Cardiovascular;  Laterality: N/A;  . BUNIONECTOMY WITH HAMMERTOE RECONSTRUCTION Bilateral ~ 1990  .  CARDIAC PACEMAKER PLACEMENT  06/27/99   Medtronic PM implanted by Dr [name]  . CARDIOVERSION N/A 12/02/2013   Procedure: CARDIOVERSION;  Surgeon: [name], MD;  Location: [facility] ENDOSCOPY;  Service: Cardiovascular;  Laterality: N/A;  . CATARACT EXTRACTION W/ INTRAOCULAR LENS  IMPLANT, BILATERAL Bilateral   . COLONOSCOPY WITH PROPOFOL N/A 04/22/2013   Procedure: COLONOSCOPY WITH PROPOFOL;  Surgeon: [name], MD;  Location: [facility] ENDOSCOPY;  Service: Endoscopy;  Laterality: N/A;  . DILATION AND CURETTAGE OF UTERUS  X 2   "when I was going thru menopause"  . ESOPHAGOGASTRODUODENOSCOPY (EGD) WITH PROPOFOL N/A 04/22/2013   Procedure: ESOPHAGOGASTRODUODENOSCOPY (EGD) WITH PROPOFOL;  Surgeon: [name], MD;  Location: [facility] ENDOSCOPY;  Service:  Endoscopy;  Laterality: N/A;  . HERNIA REPAIR    . INCISIONAL HERNIA REPAIR    . INSERT / REPLACE / REMOVE PACEMAKER  06/2007   "took out the old; put in new"  . INSERT / REPLACE / REMOVE PACEMAKER  05/10/2014   MDT PPM generator change by Dr [name]  . JOINT REPLACEMENT    . PARTIAL NEPHRECTOMY Left 05/1974   stone disease  . PERMANENT PACEMAKER GENERATOR CHANGE N/A 05/10/2014   Procedure: PERMANENT PACEMAKER GENERATOR CHANGE;  Surgeon: Thompson Grayer, MD;  Location: [facility] CATH LAB;  Service: Cardiovascular;  Laterality: N/A;  . TONSILLECTOMY AND ADENOIDECTOMY  1930's  . TOTAL KNEE ARTHROPLASTY Right 2001     Medications: Current Meds  Medication Sig  . amLODipine (NORVASC) 5 MG tablet TAKE 1 TABLET BY MOUTH  DAILY  . atorvastatin (LIPITOR) 40 MG tablet TAKE 1 TABLET BY MOUTH ONCE DAILY FOR CHOLESTEROL  . Cholecalciferol (VITAMIN D) 2000 units CAPS Take 1 capsule by mouth daily.  . fluticasone (FLONASE) 50 MCG/ACT nasal spray USE 2 SPRAYS EACH NOSTRIL ONCE A DAY AS NEEDED FOR ALLERGIES OR CONGESTION.  . furosemide (LASIX) 40 MG tablet Take 1.5 tablets (60 mg total) by mouth daily. Ok to use an extra dose of 40 mg for weight gain  . gabapentin (NEURONTIN) 100 MG capsule TAKE 1 CAPSULE BY MOUTH AT  BEDTIME  . latanoprost (XALATAN) 0.005 % ophthalmic solution Place 1 drop into both eyes at bedtime.  [name] levothyroxine (SYNTHROID) 88 MCG tablet TAKE 1 TABLET BY MOUTH  DAILY BEFORE BREAKFAST  . losartan (COZAAR) 100 MG tablet Take 100 mg by mouth 2 (two) times daily.  . LUTEIN PO Take 25 mg by mouth daily.   . Magnesium 250 MG TABS Take 1 tablet by mouth daily.  . melatonin 3 MG TABS tablet Take 1 tablet (3 mg total) by mouth at bedtime.  . metoprolol succinate (TOPROL-XL) 100 MG 24 hr tablet Take 100 mg by mouth daily.  . montelukast (SINGULAIR) 10 MG tablet TAKE 1 TABLET BY MOUTH  DAILY  . Multiple Vitamins-Minerals (PRESERVISION AREDS 2) CAPS Take 1 capsule by mouth in the morning and at bedtime.     . potassium chloride SA (KLOR-CON) 20 MEQ tablet Take 20 mEq by mouth daily.  . predniSONE (DELTASONE) 5 MG tablet Take 1 tablet (5 mg total) by mouth daily with breakfast.  . Rivaroxaban (XARELTO) 15 MG TABS tablet TAKE 1 TABLET BY MOUTH  DAILY WITH SUPPER  . SYMBICORT 160-4.5 MCG/ACT inhaler INHALE 2 PUFFS BY MOUTH  INTO THE LUNGS TWO TIMES  DAILY  . VENTOLIN HFA 108 (90 Base) MCG/ACT inhaler USE 2 PUFFS EVERY 6 HOURS AS NEEDED FOR SHORTNESS OF BREATH AND WHEEZING.     Allergies: Allergies  Allergen  Reactions  . Brimonidine Tartrate-Timolol Other (See Comments)    REACTION: systemic malaise amigen eye drop  . Penicillins Hives  . Breo Ellipta [Fluticasone Furoate-Vilanterol] Other (See Comments)    Ran blood pressure up Increased blood pressure    Social History: The patient  reports that she has never smoked. She has never used smokeless tobacco. She reports current alcohol use of about 7.0 standard drinks of alcohol per week. She reports that she does not use drugs.   Family History: The patient's family history includes Breast cancer in her mother; Heart attack in her brother; Hypertension in her father; Malignant hypertension in her father; Renal Disease in her father; Stroke in her brother.   Review of Systems: Please see the history of present illness.   All other systems are reviewed and negative.   Physical Exam: VS:  BP 116/76   Pulse 71   Ht 5\' 3"  (1.6 m)   Wt 133 lb (60.3 kg)   LMP 04/08/1974   SpO2 98%   BMI 23.56 kg/m  .  BMI Body mass index is 23.56 kg/m.  Wt Readings from Last 3 Encounters:  12/07/19 133 lb (60.3 kg)  11/23/19 132 lb 6.4 oz (60.1 kg)  11/10/19 132 lb (59.9 kg)    General: Pleasant. Elderly. Alert and in no acute distress. She is very hard of hearing.  Weight is stable.  Cardiac: Heart tones are distant. Rate is ok. Outflow murmur noted. Trace bilateral edema.  Respiratory:  Lungs are clear to auscultation bilaterally with normal work  of breathing.  GI: Soft and nontender.  MS: No deformity or atrophy. Gait and ROM intact.  Skin: Warm and dry. Color is normal.  Neuro:  Strength and sensation are intact and no gross focal deficits noted.  Psych: Alert, appropriate and with normal affect.   LABORATORY DATA:  EKG:  EKG is not ordered today.   Lab Results  Component Value Date   WBC 4.6 10/28/2019   HGB 13.5 10/28/2019   HCT 42.3 10/28/2019   PLT 225 10/28/2019   GLUCOSE 136 (H) 10/28/2019   CHOL 138 11/04/2017   TRIG 53 11/04/2017   HDL 70 11/04/2017   LDLCALC 58 11/04/2017   ALT 26 10/27/2019   AST 35 10/27/2019   NA 139 10/28/2019   K 3.6 10/28/2019   CL 100 10/28/2019   CREATININE 0.96 10/28/2019   BUN 19 10/28/2019   CO2 25 10/28/2019   TSH 1.05 04/30/2016   INR 1.06 06/24/2017   HGBA1C 5.7 (H) 06/25/2017     BNP (last 3 results) No results for input(s): BNP in the last 8760 hours.  ProBNP (last 3 results) Recent Labs    01/12/19 1219  PROBNP 1,334*     Other Studies Reviewed Today:  ECHOIMPRESSIONS10/2020  1. Left ventricular ejection fraction, by visual estimation, is >75%. The  left ventricle has hyperdynamic function. There is moderately increased  left ventricular hypertrophy.  2. Abnormal septal motion consistent with RV pacemaker.  3. Left ventricular diastolic Doppler parameters are indeterminate  pattern of LV diastolic filling.  4. Global right ventricle has mildly reduced systolic function.The right  ventricular size is normal. No increase in right ventricular wall  thickness.  5. Left atrial size was severely dilated.  6. Right atrial size was severely dilated.  7. Small pericardial effusion.  8. Moderate aortic valve annular calcification.  9. Moderate mitral annular calcification.  10. The mitral valve is normal in structure. Trace mitral valve  regurgitation.  11. The tricuspid valve is normal in structure. Tricuspid valve  regurgitation moderate.    12. The aortic valve is tricuspid Aortic valve regurgitation is trivial by  color flow Doppler. Moderate aortic valve stenosis.  13. There is Moderate calcification of the aortic valve.  14. There is Moderate thickening of the aortic valve.  15. Calcified leaflets with limited leaflet excursion. Visually appears to  minimally open.  16. The pulmonic valve was normal in structure. Pulmonic valve  regurgitation is mild by color flow Doppler.  17. The aortic root was not well visualized.  18. Moderately elevated pulmonary artery systolic pressure.  19. A pacer wire is visualized in the RA and RV.  20. The inferior vena cava is normal in size with greater than 50%  respiratory variability, suggesting right atrial pressure of 3 mmHg.   MyoviewStudy Highlights9/2020    Nuclear stress EF: 58%.  No T wave inversion was noted during stress.  There was no ST segment deviation noted during stress.  This is a low risk study.  No reversible ischemia. LVEF 58% with normal wall motion. This is a low risk study.     ASSESSMENT& PLAN:   1. Recent CAP - resolved.   2. Chronic DOE - felt to be multifactorial - has COPD and moderate AS. On higher dose of baseline diuretic.   3. Persistent AF - has underlying PPM in place - remains on anticoagulation.   4. Prior AV nodal ablation  5. Underlying PPM - has upcoming EP visit with Renee'  6. HTN - BP looks fine - she is on higher doses of ARB that has been chronic - no changes made today.   7. Chronic diastolic HF - stable at this time. Not using extra Lasix but knows to use if needed.   8. Prior TIA x 2 - she remains on anticoagulation with Xarelto.   9. Known AS - could consider updating her echo on return.   Current medicines are reviewed with the patient today.  The patient does not have concerns regarding medicines other than what has been noted above.  The following changes have been made:  See above.  Labs/ tests  ordered today include:   No orders of the defined types were placed in this encounter.    Disposition:   FU with Dr. Marlou Porch in 3 months.      Patient is agreeable to this plan and will call if any problems develop in the interim.   SignedTruitt Merle, NP  12/07/2019 9:12 AM  [facility] [address] [place], [place]  [address] Phone: [phone] Fax: [phone]

## 2019-12-07 ENCOUNTER — Ambulatory Visit (INDEPENDENT_AMBULATORY_CARE_PROVIDER_SITE_OTHER): Payer: Medicare Other | Admitting: Nurse Practitioner

## 2019-12-07 ENCOUNTER — Other Ambulatory Visit: Payer: Self-pay

## 2019-12-07 ENCOUNTER — Encounter: Payer: Self-pay | Admitting: Nurse Practitioner

## 2019-12-07 VITALS — BP 116/76 | HR 71 | Ht 63.0 in | Wt 133.0 lb

## 2019-12-07 DIAGNOSIS — I4821 Permanent atrial fibrillation: Secondary | ICD-10-CM

## 2019-12-07 DIAGNOSIS — I495 Sick sinus syndrome: Secondary | ICD-10-CM

## 2019-12-07 DIAGNOSIS — I350 Nonrheumatic aortic (valve) stenosis: Secondary | ICD-10-CM

## 2019-12-07 DIAGNOSIS — I10 Essential (primary) hypertension: Secondary | ICD-10-CM

## 2019-12-07 DIAGNOSIS — I5032 Chronic diastolic (congestive) heart failure: Secondary | ICD-10-CM | POA: Diagnosis not present

## 2019-12-07 DIAGNOSIS — R0602 Shortness of breath: Secondary | ICD-10-CM

## 2019-12-07 NOTE — Patient Instructions (Addendum)
After Visit Summary:  We will be checking the following labs today - NONE   Medication Instructions:    Continue with your current medicines.    If you need a refill on your cardiac medications before your next appointment, please call your pharmacy.     Testing/Procedures To Be Arranged:  N/A  Follow-Up:   See Dr. Marlou Porch in 3 months.     At [facility], you and your health needs are our priority.  As part of our continuing mission to provide you with exceptional heart care, we have created designated Provider Care Teams.  These Care Teams include your primary Cardiologist (physician) and Advanced Practice Providers (APPs -  Physician Assistants and Nurse Practitioners) who all work together to provide you with the care you need, when you need it.  Special Instructions:  . Stay safe, wash your hands for at least 20 seconds and wear a mask when needed.  . It was good to talk with you today.    Call the [place] office at [phone] if you have any questions, problems or concerns.

## 2019-12-09 ENCOUNTER — Ambulatory Visit (INDEPENDENT_AMBULATORY_CARE_PROVIDER_SITE_OTHER): Payer: Medicare Other | Admitting: *Deleted

## 2019-12-09 DIAGNOSIS — I495 Sick sinus syndrome: Secondary | ICD-10-CM | POA: Diagnosis not present

## 2019-12-11 LAB — CUP PACEART REMOTE DEVICE CHECK
Battery Impedance: 327 Ohm
Battery Remaining Longevity: 109 mo
Battery Voltage: 2.79 V
Brady Statistic RV Percent Paced: 99 %
Date Time Interrogation Session: 20210902161509
Implantable Lead Implant Date: 20090318
Implantable Lead Implant Date: 20090318
Implantable Lead Location: 753859
Implantable Lead Location: 753860
Implantable Lead Model: 5076
Implantable Lead Model: 5092
Implantable Pulse Generator Implant Date: 20160202
Lead Channel Impedance Value: 661 Ohm
Lead Channel Impedance Value: 67 Ohm
Lead Channel Pacing Threshold Amplitude: 1.125 V
Lead Channel Pacing Threshold Pulse Width: 0.4 ms
Lead Channel Setting Pacing Amplitude: 2.5 V
Lead Channel Setting Pacing Pulse Width: 0.4 ms
Lead Channel Setting Sensing Sensitivity: 4 mV

## 2019-12-14 NOTE — Progress Notes (Signed)
Remote pacemaker transmission.   

## 2019-12-15 ENCOUNTER — Other Ambulatory Visit: Payer: Self-pay | Admitting: Internal Medicine

## 2019-12-15 MED ORDER — FLUTICASONE PROPIONATE 50 MCG/ACT NA SUSP: NASAL | 99 refills | Status: DC

## 2019-12-16 ENCOUNTER — Other Ambulatory Visit: Payer: Self-pay | Admitting: Internal Medicine

## 2019-12-24 NOTE — Progress Notes (Signed)
Cardiology Office Note Date:  12/27/2019  Patient ID:  Jody Blake, Jody Blake 1925-07-06, MRN [id number] PCP:  Gayland Curry, DO  Cardiologist:  Dr. Marlou Porch and L. Servando Snare, NP Electrophysiologist: Dr. [name]    Chief Complaint: annual device visit  History of Present Illness: Jody Blake is a 86 y.o. female with history of HTN, hypothyroidism, HLD, COPD, permanent Afib s/p AVNode ablation and PPM, chronic CHF (diastolic), TIAs, VHD w/AS.  She comes today to be seen for Dr. [name], last seen by him 12/2018 via tele health visit.  She had worsening SOB with symptoms of orthopnea and edema.  Discussed sodium reduction and instructed to take extra lasix for 3 days, planned for stress test to evaluate anginal equivalent.  More recently she saw L. Gerhardt, NP 12/07/19, she was over a recent pneumonia, doing better.  Residual SOB felt multifactorial with  Her COPD, mod AS, her diastolic CHF appeared compensated at that visit.  No changes were made with plans to perhaps update her echo at her next visit.  Planned to see Dr. Marlou Porch in 3 mo.  TODAY She is doing well, has bad shoulders, arthritic problems, is hard of hearing and has poor vision but states she gets around quite well.  Continues to live at [facility] in independent living. She denies any cardiac awareness, no CP or palpitations She has some baseline DOE that is unchanged No dizzy spells, near syncope or syncope She denies any bleeding or signs of bleeding   Device information MDT dual chamber PPM implanted 2009, gen change  05/10/2014 Programmed VVIR S/p AV node ablation   Past Medical History:  Diagnosis Date  . Anemia   . Aortic stenosis    a. Echo 09/06/12 EF 55-60%, no WMA, G2DD, Ao valve sclerosis w/ mod stenosis, LA mildly dilated, PA pressure 34mmHg  . Asthmatic bronchitis   . Complete heart block [facility])    s/p permanent pacemaker 06/27/1999 (Battery change 06/2007 and 2016).  s/p AV nodal ablation by Dr [name] 2016.  [name]  COPD (chronic obstructive pulmonary disease) (HCC)    Dr. [name]  . DDD (degenerative disc disease)   . Diastolic dysfunction    a. Echo 09/06/12 EF 55-60%, no WMA, G2DD, Ao valve sclerosis w/ mod stenosis, LA mildly dilated, PA pressure 1mmHg  . Diverticulosis   . DVT (deep vein thrombosis) in pregnancy 1954   a. LLE  . Hypertension   . Hypothyroidism    on medication  . Macular degeneration    Dr. [name]  . Osteoarthrosis, unspecified whether generalized or localized, other specified sites   . PAF (paroxysmal atrial fibrillation) (HCC)    Stopped flecainide, on amiodarone but still has bouts of A FIB (mostly in mornings).   . Pure hypercholesterolemia   . Staghorn calculus    Left    Past Surgical History:  Procedure Laterality Date  . APPENDECTOMY  ~ 1941  . AV NODE ABLATION  05/10/2014  . AV NODE ABLATION N/A 05/10/2014   Procedure: AV NODE ABLATION;  Surgeon: Thompson Grayer, MD;  Location: [facility] CATH LAB;  Service: Cardiovascular;  Laterality: N/A;  . BUNIONECTOMY WITH HAMMERTOE RECONSTRUCTION Bilateral ~ 1990  . CARDIAC PACEMAKER PLACEMENT  06/27/99   Medtronic PM implanted by Dr [name]  . CARDIOVERSION N/A 12/02/2013   Procedure: CARDIOVERSION;  Surgeon: [name], MD;  Location: [facility];  Service: Cardiovascular;  Laterality: N/A;  . CATARACT EXTRACTION W/ INTRAOCULAR LENS  IMPLANT, BILATERAL Bilateral   . COLONOSCOPY  WITH PROPOFOL N/A 04/22/2013   Procedure: COLONOSCOPY WITH PROPOFOL;  Surgeon: [name], MD;  Location: [facility] ENDOSCOPY;  Service: Endoscopy;  Laterality: N/A;  . DILATION AND CURETTAGE OF UTERUS  X 2   "when I was going thru menopause"  . ESOPHAGOGASTRODUODENOSCOPY (EGD) WITH PROPOFOL N/A 04/22/2013   Procedure: ESOPHAGOGASTRODUODENOSCOPY (EGD) WITH PROPOFOL;  Surgeon: [name], MD;  Location: [facility] ENDOSCOPY;  Service: Endoscopy;  Laterality: N/A;  . HERNIA REPAIR    . INCISIONAL HERNIA REPAIR    . INSERT / REPLACE / REMOVE PACEMAKER  06/2007    "took out the old; put in new"  . INSERT / REPLACE / REMOVE PACEMAKER  05/10/2014   MDT PPM generator change by Dr [name]  . JOINT REPLACEMENT    . PARTIAL NEPHRECTOMY Left 05/1974   stone disease  . PERMANENT PACEMAKER GENERATOR CHANGE N/A 05/10/2014   Procedure: PERMANENT PACEMAKER GENERATOR CHANGE;  Surgeon: Thompson Grayer, MD;  Location: [facility] CATH LAB;  Service: Cardiovascular;  Laterality: N/A;  . TONSILLECTOMY AND ADENOIDECTOMY  1930's  . TOTAL KNEE ARTHROPLASTY Right 2001    Current Outpatient Medications  Medication Sig Dispense Refill  . amLODipine (NORVASC) 5 MG tablet TAKE 1 TABLET BY MOUTH  DAILY 90 tablet 3  . atorvastatin (LIPITOR) 40 MG tablet TAKE 1 TABLET BY MOUTH ONCE DAILY FOR CHOLESTEROL 90 tablet 1  . azelastine (ASTELIN) 0.1 % nasal spray USE 1 TO 2 SPRAYS INTO BOTH NOSTRILS TWICE DAILY. 30 mL PRN  . Cholecalciferol (VITAMIN D) 2000 units CAPS Take 1 capsule by mouth daily.    . fluticasone (FLONASE) 50 MCG/ACT nasal spray USE 2 SPRAYS EACH NOSTRIL ONCE A DAY AS NEEDED FOR ALLERGIES OR CONGESTION. 16 g PRN  . furosemide (LASIX) 40 MG tablet Take 1.5 tablets (60 mg total) by mouth daily. Ok to use an extra dose of 40 mg for weight gain 90 tablet 1  . gabapentin (NEURONTIN) 100 MG capsule TAKE 1 CAPSULE BY MOUTH AT  BEDTIME 90 capsule 3  . latanoprost (XALATAN) 0.005 % ophthalmic solution Place 1 drop into both eyes at bedtime.    [name] levothyroxine (SYNTHROID) 88 MCG tablet TAKE 1 TABLET BY MOUTH  DAILY BEFORE BREAKFAST 90 tablet 3  . losartan (COZAAR) 100 MG tablet Take 100 mg by mouth 2 (two) times daily.    . LUTEIN PO Take 25 mg by mouth daily.     . Magnesium 250 MG TABS Take 1 tablet by mouth daily.    . melatonin 3 MG TABS tablet Take 1 tablet (3 mg total) by mouth at bedtime. 90 tablet 3  . metoprolol succinate (TOPROL-XL) 100 MG 24 hr tablet Take 100 mg by mouth daily.    . montelukast (SINGULAIR) 10 MG tablet TAKE 1 TABLET BY MOUTH  DAILY 90 tablet 3  . Multiple  Vitamins-Minerals (PRESERVISION AREDS 2) CAPS Take 1 capsule by mouth in the morning and at bedtime.     . potassium chloride (KLOR-CON) 10 MEQ tablet Take 10 mEq by mouth daily.    . potassium chloride SA (KLOR-CON) 20 MEQ tablet Take 20 mEq by mouth daily.    . predniSONE (DELTASONE) 5 MG tablet Take 1 tablet (5 mg total) by mouth daily with breakfast. 90 tablet 1  . Rivaroxaban (XARELTO) 15 MG TABS tablet TAKE 1 TABLET BY MOUTH  DAILY WITH SUPPER 10 tablet 1  . SYMBICORT 160-4.5 MCG/ACT inhaler INHALE 2 PUFFS BY MOUTH  INTO THE LUNGS TWO TIMES  DAILY 30.6  g 3  . VENTOLIN HFA 108 (90 Base) MCG/ACT inhaler USE 2 PUFFS EVERY 6 HOURS AS NEEDED FOR SHORTNESS OF BREATH AND WHEEZING. 18 g 0   No current facility-administered medications for this visit.    Allergies:   Brimonidine tartrate-timolol, Penicillins, and Breo ellipta [fluticasone furoate-vilanterol]   Social History:  The patient  reports that she has never smoked. She has never used smokeless tobacco. She reports current alcohol use of about 7.0 standard drinks of alcohol per week. She reports that she does not use drugs.   Family History:  The patient's family history includes Breast cancer in her mother; Heart attack in her brother; Hypertension in her father; Malignant hypertension in her father; Renal Disease in her father; Stroke in her brother.  ROS:  Please see the history of present illness.    All other systems are reviewed and otherwise negative.   PHYSICAL EXAM:  VS:  BP 114/64   Pulse 88   Ht 5\' 4"  (1.626 m)   Wt 133 lb (60.3 kg)   LMP 04/08/1974   SpO2 95%   BMI 22.83 kg/m  BMI: Body mass index is 22.83 kg/m. Well nourished, well developed, in no acute distress HEENT: normocephalic, atraumatic Neck: no JVD, carotid bruits or masses Cardiac:  RRR; no significant murmurs, no rubs, or gallops Lungs:  CTA b/l, no wheezing, rhonchi or rales are appreciated today Abd: soft, nontender MS: no deformity or atrophy Ext:  trace-1+ edema (reported as chronic and unchanged by the pt) Skin: warm and dry, no rash Neuro:  No gross deficits appreciated Psych: euthymic mood, full affect  PPM site is stable, no tethering or discomfort   EKG:  Not done today  Device interrogation done today and reviewed by myself: Battery and lead measurements are good No HVR 9 years estimated battery life   01/15/2019: TTE IMPRESSIONS  1. Left ventricular ejection fraction, by visual estimation, is >75%. The  left ventricle has hyperdynamic function. There is moderately increased  left ventricular hypertrophy.  2. Abnormal septal motion consistent with RV pacemaker.  3. Left ventricular diastolic Doppler parameters are indeterminate  pattern of LV diastolic filling.  4. Global right ventricle has mildly reduced systolic function.The right  ventricular size is normal. No increase in right ventricular wall  thickness.  5. Left atrial size was severely dilated.  6. Right atrial size was severely dilated.  7. Small pericardial effusion.  8. Moderate aortic valve annular calcification.  9. Moderate mitral annular calcification.  10. The mitral valve is normal in structure. Trace mitral valve  regurgitation.  11. The tricuspid valve is normal in structure. Tricuspid valve  regurgitation moderate.  12. The aortic valve is tricuspid Aortic valve regurgitation is trivial by  color flow Doppler. Moderate aortic valve stenosis.  13. There is Moderate calcification of the aortic valve.  14. There is Moderate thickening of the aortic valve.  15. Calcified leaflets with limited leaflet excursion. Visually appears to  minimally open.  16. The pulmonic valve was normal in structure. Pulmonic valve  regurgitation is mild by color flow Doppler.  17. The aortic root was not well visualized.  18. Moderately elevated pulmonary artery systolic pressure.  19. A pacer wire is visualized in the RA and RV.  20. The inferior vena  cava is normal in size with greater than 50%  respiratory variability, suggesting right atrial pressure of 3 mmHg.    12/24/2018: Lexiscan stress myoview  Nuclear stress EF: 58%.  No T wave  inversion was noted during stress.  There was no ST segment deviation noted during stress.  This is a low risk study.   No reversible ischemia. LVEF 58% with normal wall motion. This is a low risk study.    Recent Labs: 01/12/2019: NT-Pro BNP 1,334 10/27/2019: ALT 26; Magnesium 2.5 10/28/2019: BUN 19; Creatinine, Ser 0.96; Hemoglobin 13.5; Platelets 225; Potassium 3.6; Sodium 139  No results found for requested labs within last 8760 hours.   CrCl cannot be calculated (Patient's most recent lab result is older than the maximum 21 days allowed.).   Wt Readings from Last 3 Encounters:  12/27/19 133 lb (60.3 kg)  12/07/19 133 lb (60.3 kg)  11/23/19 132 lb 6.4 oz (60.1 kg)     Other studies reviewed: Additional studies/records reviewed today include: summarized above  ASSESSMENT AND PLAN:  1. PPM     Intact function     No programming changes made  2. Permanent Afib     CHA2DS2Vasc is 5, on Xarelto, appropriately dosed      3. Chronic CHF (diastolic)      Weight is stable      She describes her edema and her breathing at her baseline      She sees Dr. Marlou Porch in Nov  4. HTN     looks good  5. HLD     Not addressed today   Disposition: F/u with remotes Q 25mo and in clinic in a year, sooner if needed   Current medicines are reviewed at length with the patient today.  The patient did not have any concerns regarding medicines.  Venetia Night, PA-C 12/27/2019 11:21 AM     [place] [address] [phone] (office)  [phone] (fax)

## 2019-12-27 ENCOUNTER — Other Ambulatory Visit: Payer: Self-pay

## 2019-12-27 ENCOUNTER — Ambulatory Visit (INDEPENDENT_AMBULATORY_CARE_PROVIDER_SITE_OTHER): Payer: Medicare Other | Admitting: Physician Assistant

## 2019-12-27 VITALS — BP 114/64 | HR 88 | Ht 64.0 in | Wt 133.0 lb

## 2019-12-27 DIAGNOSIS — I5032 Chronic diastolic (congestive) heart failure: Secondary | ICD-10-CM

## 2019-12-27 DIAGNOSIS — I10 Essential (primary) hypertension: Secondary | ICD-10-CM | POA: Diagnosis not present

## 2019-12-27 DIAGNOSIS — Z950 Presence of cardiac pacemaker: Secondary | ICD-10-CM

## 2019-12-27 DIAGNOSIS — I4821 Permanent atrial fibrillation: Secondary | ICD-10-CM | POA: Diagnosis not present

## 2019-12-27 NOTE — Patient Instructions (Addendum)
Medication Instructions:  Your physician recommends that you continue on your current medications as directed. Please refer to the Current Medication list given to you today.   *If you need a refill on your cardiac medications before your next appointment, please call your pharmacy*   Lab Work: NONE ORDERED  TODAY   If you have labs (blood work) drawn today and your tests are completely normal, you will receive your results only by: . MyChart Message (if you have MyChart) OR . A paper copy in the mail If you have any lab test that is abnormal or we need to change your treatment, we will call you to review the results.   Testing/Procedures: NONE ORDERED  TODAY   Follow-Up: At [facility], you and your health needs are our priority.  As part of our continuing mission to provide you with exceptional heart care, we have created designated Provider Care Teams.  These Care Teams include your primary Cardiologist (physician) and Advanced Practice Providers (APPs -  Physician Assistants and Nurse Practitioners) who all work together to provide you with the care you need, when you need it.  We recommend signing up for the patient portal called "MyChart".  Sign up information is provided on this After Visit Summary.  MyChart is used to connect with patients for Virtual Visits (Telemedicine).  Patients are able to view lab/test results, encounter notes, upcoming appointments, etc.  Non-urgent messages can be sent to your provider as well.   To learn more about what you can do with MyChart, go to [email or web address].    Your next appointment:   1 year(s)  The format for your next appointment:   In Person  Provider:   You may see  Dr. Allred  or one of the following Advanced Practice Providers on your designated Care Team:    Amber Seiler, NP  Renee Ursuy, PA-C  Michael "Andy" Tillery, PA-C    Other Instructions   

## 2019-12-28 DIAGNOSIS — Z961 Presence of intraocular lens: Secondary | ICD-10-CM | POA: Diagnosis not present

## 2019-12-28 DIAGNOSIS — H401131 Primary open-angle glaucoma, bilateral, mild stage: Secondary | ICD-10-CM | POA: Diagnosis not present

## 2019-12-28 DIAGNOSIS — D3132 Benign neoplasm of left choroid: Secondary | ICD-10-CM | POA: Diagnosis not present

## 2019-12-28 DIAGNOSIS — H35371 Puckering of macula, right eye: Secondary | ICD-10-CM | POA: Diagnosis not present

## 2019-12-28 DIAGNOSIS — H353114 Nonexudative age-related macular degeneration, right eye, advanced atrophic with subfoveal involvement: Secondary | ICD-10-CM | POA: Diagnosis not present

## 2019-12-28 DIAGNOSIS — H353122 Nonexudative age-related macular degeneration, left eye, intermediate dry stage: Secondary | ICD-10-CM | POA: Diagnosis not present

## 2020-01-12 ENCOUNTER — Encounter: Payer: Self-pay | Admitting: Internal Medicine

## 2020-01-12 ENCOUNTER — Non-Acute Institutional Stay: Payer: Medicare Other | Admitting: Internal Medicine

## 2020-01-12 ENCOUNTER — Other Ambulatory Visit: Payer: Self-pay

## 2020-01-12 VITALS — BP 110/58 | HR 87 | Temp 97.2°F | Ht 64.0 in | Wt 135.4 lb

## 2020-01-12 DIAGNOSIS — H9113 Presbycusis, bilateral: Secondary | ICD-10-CM | POA: Diagnosis not present

## 2020-01-12 DIAGNOSIS — I5032 Chronic diastolic (congestive) heart failure: Secondary | ICD-10-CM

## 2020-01-12 DIAGNOSIS — E7800 Pure hypercholesterolemia, unspecified: Secondary | ICD-10-CM

## 2020-01-12 DIAGNOSIS — H353130 Nonexudative age-related macular degeneration, bilateral, stage unspecified: Secondary | ICD-10-CM | POA: Diagnosis not present

## 2020-01-12 DIAGNOSIS — J449 Chronic obstructive pulmonary disease, unspecified: Secondary | ICD-10-CM

## 2020-01-12 DIAGNOSIS — Z66 Do not resuscitate: Secondary | ICD-10-CM

## 2020-01-12 DIAGNOSIS — G4762 Sleep related leg cramps: Secondary | ICD-10-CM | POA: Diagnosis not present

## 2020-01-12 DIAGNOSIS — I4821 Permanent atrial fibrillation: Secondary | ICD-10-CM | POA: Diagnosis not present

## 2020-01-12 NOTE — Progress Notes (Signed)
Location:  Occupational psychologist of Service:  Clinic (12)  Provider: Tashae Inda L. Mariea Clonts, D.O., C.M.D.  Code Status: DNR Goals of Care:  Advanced Directives 01/12/2020  Does Patient Have a Medical Advance Directive? Yes  Type of Paramedic of [place];Out of facility DNR (pink MOST or yellow form)  Does patient want to make changes to medical advance directive? No - Patient declined  Copy of [place] in Chart? Yes - validated most recent copy scanned in chart (See row information)  Would patient like information on creating a medical advance directive? -  Pre-existing out of facility DNR order (yellow form or pink MOST form) Pink MOST/Yellow Form most recent copy in chart - Physician notified to receive inpatient order     Chief Complaint  Patient presents with  . Medical Management of Chronic Issues    4 month follow up   . Health Maintenance    Influenza (WS)    HPI: Patient is a 86 y.o. female seen today for medical management of chronic diseases.    She is finally over her pneumonia.  She has some energy back.  She's been getting leg cramps and feet cramp up (demonstrates).  Tonic water helped at first, but no more.  They are only at night.  She would like to go without her lipitor and see if that helps.  We reviewed the reasons for her lipitor being secondary TIA and stroke prevention.  She still wants to try that.    She is not hearing any better.  We're repeating a lot today.    She does not feel good when she gets up in the morning.  She feels strange, kind of out of breath and achy.  It clears up by mid afternoon.  Says for her age she feels pretty well.    Eyes have not changed since last appt.    Past Medical History:  Diagnosis Date  . Anemia   . Aortic stenosis    a. Echo 09/06/12 EF 55-60%, no WMA, G2DD, Ao valve sclerosis w/ mod stenosis, LA mildly dilated, PA pressure 76mmHg  . Asthmatic bronchitis     . Complete heart block [facility])    s/p permanent pacemaker 06/27/1999 (Battery change 06/2007 and 2016).  s/p AV nodal ablation by Dr [name] 2016.  [name] COPD (chronic obstructive pulmonary disease) (HCC)    Dr. [name]  . DDD (degenerative disc disease)   . Diastolic dysfunction    a. Echo 09/06/12 EF 55-60%, no WMA, G2DD, Ao valve sclerosis w/ mod stenosis, LA mildly dilated, PA pressure 92mmHg  . Diverticulosis   . DVT (deep vein thrombosis) in pregnancy 1954   a. LLE  . Hypertension   . Hypothyroidism    on medication  . Macular degeneration    Dr. [name]  . Osteoarthrosis, unspecified whether generalized or localized, other specified sites   . PAF (paroxysmal atrial fibrillation) (HCC)    Stopped flecainide, on amiodarone but still has bouts of A FIB (mostly in mornings).   . Pure hypercholesterolemia   . Staghorn calculus    Left    Past Surgical History:  Procedure Laterality Date  . APPENDECTOMY  ~ 1941  . AV NODE ABLATION  05/10/2014  . AV NODE ABLATION N/A 05/10/2014   Procedure: AV NODE ABLATION;  Surgeon: Thompson Grayer, MD;  Location: [facility] C[facility] LAB;  Service: Cardiovascular;  Laterality: N/A;  . BUNIONECTOMY WITH HAMMERTOE RECONSTRUCTION Bilateral ~ 1990  .  CARDIAC PACEMAKER PLACEMENT  06/27/99   Medtronic PM implanted by Dr [name]  . CARDIOVERSION N/A 12/02/2013   Procedure: CARDIOVERSION;  Surgeon: [name], MD;  Location: [facility] ENDOSCOPY;  Service: Cardiovascular;  Laterality: N/A;  . CATARACT EXTRACTION W/ INTRAOCULAR LENS  IMPLANT, BILATERAL Bilateral   . COLONOSCOPY WITH PROPOFOL N/A 04/22/2013   Procedure: COLONOSCOPY WITH PROPOFOL;  Surgeon: [name], MD;  Location: [facility] ENDOSCOPY;  Service: Endoscopy;  Laterality: N/A;  . DILATION AND CURETTAGE OF UTERUS  X 2   "when I was going thru menopause"  . ESOPHAGOGASTRODUODENOSCOPY (EGD) WITH PROPOFOL N/A 04/22/2013   Procedure: ESOPHAGOGASTRODUODENOSCOPY (EGD) WITH PROPOFOL;  Surgeon: [name], MD;  Location: [facility]  ENDOSCOPY;  Service: Endoscopy;  Laterality: N/A;  . HERNIA REPAIR    . INCISIONAL HERNIA REPAIR    . INSERT / REPLACE / REMOVE PACEMAKER  06/2007   "took out the old; put in new"  . INSERT / REPLACE / REMOVE PACEMAKER  05/10/2014   MDT PPM generator change by Dr [name]  . JOINT REPLACEMENT    . PARTIAL NEPHRECTOMY Left 05/1974   stone disease  . PERMANENT PACEMAKER GENERATOR CHANGE N/A 05/10/2014   Procedure: PERMANENT PACEMAKER GENERATOR CHANGE;  Surgeon: Thompson Grayer, MD;  Location: [facility] CATH LAB;  Service: Cardiovascular;  Laterality: N/A;  . TONSILLECTOMY AND ADENOIDECTOMY  1930's  . TOTAL KNEE ARTHROPLASTY Right 2001    Allergies  Allergen Reactions  . Brimonidine Tartrate-Timolol Other (See Comments)    REACTION: systemic malaise amigen eye drop  . Penicillins Hives  . Breo Ellipta [Fluticasone Furoate-Vilanterol] Other (See Comments)    Ran blood pressure up Increased blood pressure    Outpatient Encounter Medications as of 01/12/2020  Medication Sig  . amLODipine (NORVASC) 5 MG tablet TAKE 1 TABLET BY MOUTH  DAILY  . atorvastatin (LIPITOR) 40 MG tablet TAKE 1 TABLET BY MOUTH ONCE DAILY FOR CHOLESTEROL  . azelastine (ASTELIN) 0.1 % nasal spray USE 1 TO 2 SPRAYS INTO BOTH NOSTRILS TWICE DAILY.  [name] Cholecalciferol (VITAMIN D) 2000 units CAPS Take 1 capsule by mouth daily.  . fluticasone (FLONASE) 50 MCG/ACT nasal spray USE 2 SPRAYS EACH NOSTRIL ONCE A DAY AS NEEDED FOR ALLERGIES OR CONGESTION.  . furosemide (LASIX) 40 MG tablet Take 1.5 tablets (60 mg total) by mouth daily. Ok to use an extra dose of 40 mg for weight gain  . gabapentin (NEURONTIN) 100 MG capsule TAKE 1 CAPSULE BY MOUTH AT  BEDTIME  . latanoprost (XALATAN) 0.005 % ophthalmic solution Place 1 drop into both eyes at bedtime.  [name] levothyroxine (SYNTHROID) 88 MCG tablet TAKE 1 TABLET BY MOUTH  DAILY BEFORE BREAKFAST  . losartan (COZAAR) 100 MG tablet Take 100 mg by mouth 2 (two) times daily.  . LUTEIN PO Take 25 mg by  mouth daily.   . Magnesium 250 MG TABS Take 1 tablet by mouth daily.  . melatonin 3 MG TABS tablet Take 1 tablet (3 mg total) by mouth at bedtime.  . metoprolol succinate (TOPROL-XL) 100 MG 24 hr tablet Take 100 mg by mouth daily.  . montelukast (SINGULAIR) 10 MG tablet TAKE 1 TABLET BY MOUTH  DAILY  . Multiple Vitamins-Minerals (PRESERVISION AREDS 2) CAPS Take 1 capsule by mouth in the morning and at bedtime.   . potassium chloride (KLOR-CON) 10 MEQ tablet Take 10 mEq by mouth daily.  . potassium chloride SA (KLOR-CON) 20 MEQ tablet Take 20 mEq by mouth daily.  . predniSONE (DELTASONE) 5 MG  tablet Take 1 tablet (5 mg total) by mouth daily with breakfast.  . Rivaroxaban (XARELTO) 15 MG TABS tablet TAKE 1 TABLET BY MOUTH  DAILY WITH SUPPER  . SYMBICORT 160-4.5 MCG/ACT inhaler INHALE 2 PUFFS BY MOUTH  INTO THE LUNGS TWO TIMES  DAILY  . VENTOLIN HFA 108 (90 Base) MCG/ACT inhaler USE 2 PUFFS EVERY 6 HOURS AS NEEDED FOR SHORTNESS OF BREATH AND WHEEZING.   No facility-administered encounter medications on file as of 01/12/2020.    Review of Systems:  Review of Systems  Constitutional: Positive for malaise/fatigue. Negative for chills and fever.  HENT: Positive for hearing loss. Negative for congestion.   Eyes: Positive for blurred vision.  Respiratory: Positive for shortness of breath and wheezing. Negative for cough.   Cardiovascular: Negative for chest pain, palpitations and leg swelling.  Gastrointestinal: Negative for abdominal pain.  Genitourinary: Negative for dysuria.  Musculoskeletal: Positive for myalgias. Negative for back pain, falls and joint pain.  Neurological: Negative for dizziness and loss of consciousness.       Some chronic unsteadiness  Endo/Heme/Allergies: Bruises/bleeds easily.  Psychiatric/Behavioral: Negative for depression and memory loss. The patient is not nervous/anxious and does not have insomnia.     Health Maintenance  Topic Date Due  . DEXA SCAN  Never done    . INFLUENZA VACCINE  11/07/2019  . TETANUS/TDAP  02/23/2029  . COVID-19 Vaccine  Completed  . PNA vac Low Risk Adult  Completed    Physical Exam: Vitals:   01/12/20 1427  BP: (!) 110/58  Pulse: 87  Temp: (!) 97.2 F (36.2 C)  TempSrc: Temporal  SpO2: 98%  Weight: 135 lb 6.4 oz (61.4 kg)  Height: 5\' 4"  (1.626 m)   Body mass index is 23.24 kg/m. Physical Exam Vitals reviewed.  Constitutional:      General: She is not in acute distress.    Appearance: Normal appearance. She is not toxic-appearing.  HENT:     Head: Normocephalic and atraumatic.     Ears:     Comments: Quite HOH, repeating myself throughout visit  Eyes:     Comments: glasses  Cardiovascular:     Rate and Rhythm: Rhythm irregular.     Heart sounds: Normal heart sounds. No murmur heard.   Pulmonary:     Effort: Pulmonary effort is normal.     Breath sounds: Wheezing present.  Abdominal:     General: Bowel sounds are normal.     Palpations: Abdomen is soft.     Tenderness: There is no abdominal tenderness.  Musculoskeletal:        General: Normal range of motion.     Cervical back: Neck supple.     Right lower leg: No edema.     Left lower leg: No edema.  Skin:    General: Skin is warm and dry.  Neurological:     General: No focal deficit present.     Mental Status: She is alert and oriented to person, place, and time.     Motor: No weakness.     Gait: Gait abnormal.     Comments: Has been a bit unsteady for several years on standing  Psychiatric:        Mood and Affect: Mood normal.        Behavior: Behavior normal.        Thought Content: Thought content normal.        Judgment: Judgment normal.     Labs reviewed: Basic Metabolic Panel: Recent Labs  09/20/19 1622 10/27/19 1928 10/28/19 0028  NA 140 142 139  K 4.3 3.5 3.6  CL 99 100 100  CO2 27 29 25   GLUCOSE 101* 102* 136*  BUN 19 21 19   CREATININE 1.20* 0.91 0.96  CALCIUM 9.7 9.4 9.3  MG  --  2.5*  --   PHOS  --  3.0  --     Liver Function Tests: Recent Labs    10/27/19 1928  AST 35  ALT 26  ALKPHOS 57  BILITOT 1.0  PROT 7.1  ALBUMIN 4.3   No results for input(s): LIPASE, AMYLASE in the last 8760 hours. No results for input(s): AMMONIA in the last 8760 hours. CBC: Recent Labs    07/03/19 0039 10/27/19 1928 10/28/19 0028  WBC 6.7 4.9 4.6  NEUTROABS 4.5 3.3 4.0  HGB 13.9 13.1 13.5  HCT 43.6 40.8 42.3  MCV 99.3 99.0 100.0  PLT 268 229 225   Lipid Panel: No results for input(s): CHOL, HDL, LDLCALC, TRIG, CHOLHDL, LDLDIRECT in the last 8760 hours. Lab Results  Component Value Date   HGBA1C 5.7 (H) 06/25/2017    Assessment/Plan 1. Leg cramps, sleep related -new and severe overnight -she thinks it's from lipitor (not new or changed) -we agreed to do a trial off and we should know in one week if improves--she's to call back if not  2. Pure hypercholesterolemia -hold lipitor -if cramps do not resolve, resume for stroke and tia prevention  3. Permanent atrial fibrillation (HCC) -cont xarelto and rate control, stable  4. COPD mixed type ([place]) -remains dyspneic and wheezing first thing in am, but improves throughout day -cont same treatments including prednisone -explained that she's immunocompromised so qualifies for booster covid vaccine--had moderna though so attempt to sign her up did not work for me due to lack of fda approval for moderna booster  5. Chronic diastolic congestive heart failure (HCC) -cont current lasix  -may need bmp, mag, phos. ica if cramps don't improve  6. Bilateral nonexudative age-related macular degeneration, unspecified stage -vision gradually declining, cont WS transportation support and med mgt with [place] [facility] nurse  7. Presbycusis of both ears -severe despite hearing aids  -requires more time due to this, but very pleasant and lovely  Labs/tests ordered:  No new Next appt: 4 mos med mgt  Keelan Tripodi L. Chetara Kropp, D.O. [facility] [address], [place] [address] Cell Phone (Mon-Fri 8am-5pm):  [phone] On Call:  [phone] & follow prompts after 5pm & weekends Office Phone:  [phone] Office Fax:  [phone]

## 2020-01-14 ENCOUNTER — Telehealth: Payer: Self-pay

## 2020-01-14 NOTE — Telephone Encounter (Signed)
Jody Blake would like a verbal order to test [facility] for a UTI and to see if you cans send a medication to [place]. Patient stated she has 1 kidney and has a history of getting UTI please advise?

## 2020-01-14 NOTE — Telephone Encounter (Signed)
She was fine on Wednesday.  UA c+s.  Push fluids.  Begin keflex 500mg  po bid x 7 days AFTER urine sample obtained.  Eat yogurt daily while on antibiotics.

## 2020-01-17 DIAGNOSIS — R3915 Urgency of urination: Secondary | ICD-10-CM | POA: Diagnosis not present

## 2020-01-17 NOTE — Telephone Encounter (Signed)
Patient has not started Keflex

## 2020-01-17 NOTE — Telephone Encounter (Signed)
Order was given to Autumn, she stated they had to get another sample on 01/16/2020 because the lab did not pick up the 1st urine sample.  She was fine on Wednesday.  UA c+s.  Push fluids.  Begin keflex 500mg  po bid x 7 days AFTER urine sample obtained.  Eat yogurt daily while on antibiotics.

## 2020-01-17 NOTE — Telephone Encounter (Signed)
Does this mean she took antibiotics before the sample was obtained?  It will be helpful to know when the results return.

## 2020-01-19 ENCOUNTER — Other Ambulatory Visit: Payer: Self-pay | Admitting: Internal Medicine

## 2020-01-19 DIAGNOSIS — N3000 Acute cystitis without hematuria: Secondary | ICD-10-CM

## 2020-01-19 MED ORDER — CEPHALEXIN 500 MG PO CAPS: 500.0000 mg | ORAL_CAPSULE | Freq: Two times a day (BID) | ORAL | 0 refills | Status: DC

## 2020-01-25 ENCOUNTER — Other Ambulatory Visit: Payer: Self-pay

## 2020-01-25 ENCOUNTER — Encounter: Payer: Self-pay | Admitting: Family

## 2020-01-25 ENCOUNTER — Ambulatory Visit (INDEPENDENT_AMBULATORY_CARE_PROVIDER_SITE_OTHER): Payer: Medicare Other | Admitting: Family

## 2020-01-25 VITALS — BP 112/60 | HR 78 | Temp 97.7°F | Ht 64.0 in | Wt 136.2 lb

## 2020-01-25 DIAGNOSIS — L03116 Cellulitis of left lower limb: Secondary | ICD-10-CM | POA: Diagnosis not present

## 2020-01-25 MED ORDER — SACCHAROMYCES BOULARDII 250 MG PO CAPS
250.0000 mg | ORAL_CAPSULE | Freq: Two times a day (BID) | ORAL | 0 refills | Status: DC
Start: 2020-01-25 — End: 2020-02-02

## 2020-01-25 MED ORDER — DOXYCYCLINE HYCLATE 100 MG PO TABS: 100.0000 mg | ORAL_TABLET | Freq: Two times a day (BID) | ORAL | 0 refills | Status: DC

## 2020-01-25 NOTE — Progress Notes (Signed)
Provider: Hadlea Furuya FNP-C  Gayland Curry, DO  Patient Care Team: Gayland Curry, DO as PCP - General (Geriatric Medicine) Jerline Pain, MD as PCP - Cardiology (Cardiology) Thompson Grayer, MD as Consulting Physician (Cardiology) Deneise Lever, MD as Consulting Physician (Pulmonary Disease) Rutherford Guys, MD as Consulting Physician (Ophthalmology) [name], MD as Consulting Physician (Gastroenterology) Gaynelle Arabian, MD as Consulting Physician (Orthopedic Surgery)  Extended Emergency Contact Information Primary Emergency Contact: [name] of [place] Phone: [phone] Mobile Phone: [phone] Relation: Son Secondary Emergency Contact: Hunt,Cheryl  [place] Mobile Phone: [phone] Relation: Daughter  Code Status:  DNR Goals of care: Advanced Directive information Advanced Directives 01/25/2020  Does Patient Have a Medical Advance Directive? Yes  Type of Paramedic of [place];Out of facility DNR (pink MOST or yellow form)  Does patient want to make changes to medical advance directive? No - Patient declined  Copy of [place] in Chart? Yes - validated most recent copy scanned in chart (See row information)  Would patient like information on creating a medical advance directive? -  Pre-existing out of facility DNR order (yellow form or pink MOST form) Pink MOST/Yellow Form most recent copy in chart - Physician notified to receive inpatient order     Chief Complaint  Patient presents with  . Acute Visit    left leg pain from vericose vein eruption, area is red,grey raised with puss and  very painful to the touch. She thinks it happened on Friday 01/21/2020.   [name] Health Maintenance    Influenza (WS)     HPI:  Pt is a 86 y.o. female seen today for an acute visit for evaluation of left leg pain from vericose vein eruption, area is red,grey raised with puss and  very  painful to the touch since 01/21/2020.she denies any fever or chills.she states cannot see well. She resides at [facility].She does not recall any injury to leg or fall episode.   Past Medical History:  Diagnosis Date  . Anemia   . Aortic stenosis    a. Echo 09/06/12 EF 55-60%, no WMA, G2DD, Ao valve sclerosis w/ mod stenosis, LA mildly dilated, PA pressure 11mmHg  . Asthmatic bronchitis   . Complete heart block [facility])    s/p permanent pacemaker 06/27/1999 (Battery change 06/2007 and 2016).  s/p AV nodal ablation by Dr [name] 2016.  [name] COPD (chronic obstructive pulmonary disease) (HCC)    Dr. [name]  . DDD (degenerative disc disease)   . Diastolic dysfunction    a. Echo 09/06/12 EF 55-60%, no WMA, G2DD, Ao valve sclerosis w/ mod stenosis, LA mildly dilated, PA pressure 71mmHg  . Diverticulosis   . DVT (deep vein thrombosis) in pregnancy 1954   a. LLE  . Hypertension   . Hypothyroidism    on medication  . Macular degeneration    Dr. [name]  . Osteoarthrosis, unspecified whether generalized or localized, other specified sites   . PAF (paroxysmal atrial fibrillation) (HCC)    Stopped flecainide, on amiodarone but still has bouts of A FIB (mostly in mornings).   . Pure hypercholesterolemia   . Staghorn calculus    Left   Past Surgical History:  Procedure Laterality Date  . APPENDECTOMY  ~ 1941  . AV NODE ABLATION  05/10/2014  . AV NODE ABLATION N/A 05/10/2014   Procedure: AV NODE ABLATION;  Surgeon: Thompson Grayer, MD;  Location: [facility] CATH LAB;  Service: Cardiovascular;  Laterality: N/A;  . BUNIONECTOMY WITH HAMMERTOE RECONSTRUCTION Bilateral ~ 1990  . CARDIAC PACEMAKER PLACEMENT  06/27/99   Medtronic PM implanted by Dr [name]  . CARDIOVERSION N/A 12/02/2013   Procedure: CARDIOVERSION;  Surgeon: [name], MD;  Location: [facility] ENDOSCOPY;  Service: Cardiovascular;  Laterality: N/A;  . CATARACT EXTRACTION W/ INTRAOCULAR LENS  IMPLANT, BILATERAL Bilateral   . COLONOSCOPY WITH PROPOFOL N/A 04/22/2013    Procedure: COLONOSCOPY WITH PROPOFOL;  Surgeon: [name], MD;  Location: [facility] ENDOSCOPY;  Service: Endoscopy;  Laterality: N/A;  . DILATION AND CURETTAGE OF UTERUS  X 2   "when I was going thru menopause"  . ESOPHAGOGASTRODUODENOSCOPY (EGD) WITH PROPOFOL N/A 04/22/2013   Procedure: ESOPHAGOGASTRODUODENOSCOPY (EGD) WITH PROPOFOL;  Surgeon: [name], MD;  Location: [facility] ENDOSCOPY;  Service: Endoscopy;  Laterality: N/A;  . HERNIA REPAIR    . INCISIONAL HERNIA REPAIR    . INSERT / REPLACE / REMOVE PACEMAKER  06/2007   "took out the old; put in new"  . INSERT / REPLACE / REMOVE PACEMAKER  05/10/2014   MDT PPM generator change by Dr [name]  . JOINT REPLACEMENT    . PARTIAL NEPHRECTOMY Left 05/1974   stone disease  . PERMANENT PACEMAKER GENERATOR CHANGE N/A 05/10/2014   Procedure: PERMANENT PACEMAKER GENERATOR CHANGE;  Surgeon: Thompson Grayer, MD;  Location[facility] CATH LAB;  Service: Cardiovascular;  Laterality: N/A;  . TONSILLECTOMY AND ADENOIDECTOMY  1930's  . TOTAL KNEE ARTHROPLASTY Right 2001    Allergies  Allergen Reactions  . Brimonidine Tartrate-Timolol Other (See Comments)    REACTION: systemic malaise amigen eye drop  . Penicillins Hives  . Breo Ellipta [Fluticasone Furoate-Vilanterol] Other (See Comments)    Ran blood pressure up Increased blood pressure    Outpatient Encounter Medications as of 01/25/2020  Medication Sig  . amLODipine (NORVASC) 5 MG tablet TAKE 1 TABLET BY MOUTH  DAILY  . atorvastatin (LIPITOR) 40 MG tablet TAKE 1 TABLET BY MOUTH ONCE DAILY FOR CHOLESTEROL  . azelastine (ASTELIN) 0.1 % nasal spray USE 1 TO 2 SPRAYS INTO BOTH NOSTRILS TWICE DAILY.  . cephALEXin (KEFLEX) 500 MG capsule Take 1 capsule (500 mg total) by mouth 2 (two) times daily.  . Cholecalciferol (VITAMIN D) 2000 units CAPS Take 1 capsule by mouth daily.  . fluticasone (FLONASE) 50 MCG/ACT nasal spray USE 2 SPRAYS EACH NOSTRIL ONCE A DAY AS NEEDED FOR ALLERGIES OR CONGESTION.  . furosemide  (LASIX) 40 MG tablet Take 1.5 tablets (60 mg total) by mouth daily. Ok to use an extra dose of 40 mg for weight gain  . gabapentin (NEURONTIN) 100 MG capsule TAKE 1 CAPSULE BY MOUTH AT  BEDTIME  . latanoprost (XALATAN) 0.005 % ophthalmic solution Place 1 drop into both eyes at bedtime.  [name] levothyroxine (SYNTHROID) 88 MCG tablet TAKE 1 TABLET BY MOUTH  DAILY BEFORE BREAKFAST  . losartan (COZAAR) 100 MG tablet Take 100 mg by mouth 2 (two) times daily.  . LUTEIN PO Take 25 mg by mouth daily.   . Magnesium 250 MG TABS Take 1 tablet by mouth daily.  . melatonin 3 MG TABS tablet Take 1 tablet (3 mg total) by mouth at bedtime.  . metoprolol succinate (TOPROL-XL) 100 MG 24 hr tablet Take 100 mg by mouth daily.  . montelukast (SINGULAIR) 10 MG tablet TAKE 1 TABLET BY MOUTH  DAILY  . Multiple Vitamins-Minerals (PRESERVISION AREDS 2) CAPS Take 1 capsule by mouth in the morning and at bedtime.   . potassium  chloride (KLOR-CON) 10 MEQ tablet Take 10 mEq by mouth daily.  . potassium chloride SA (KLOR-CON) 20 MEQ tablet Take 20 mEq by mouth daily.  . predniSONE (DELTASONE) 5 MG tablet Take 1 tablet (5 mg total) by mouth daily with breakfast.  . Rivaroxaban (XARELTO) 15 MG TABS tablet TAKE 1 TABLET BY MOUTH  DAILY WITH SUPPER  . SYMBICORT 160-4.5 MCG/ACT inhaler INHALE 2 PUFFS BY MOUTH  INTO THE LUNGS TWO TIMES  DAILY  . VENTOLIN HFA 108 (90 Base) MCG/ACT inhaler USE 2 PUFFS EVERY 6 HOURS AS NEEDED FOR SHORTNESS OF BREATH AND WHEEZING.  [name] doxycycline (VIBRA-TABS) 100 MG tablet Take 1 tablet (100 mg total) by mouth 2 (two) times daily.  [name] saccharomyces boulardii (FLORASTOR) 250 MG capsule Take 1 capsule (250 mg total) by mouth 2 (two) times daily.   No facility-administered encounter medications on file as of 01/25/2020.    Review of Systems  Constitutional: Negative for appetite change, chills, fatigue and fever.  HENT: Positive for hearing loss. Negative for congestion, postnasal drip, rhinorrhea, sinus  pressure, sinus pain, sneezing and sore throat.   Respiratory: Negative for cough, chest tightness, shortness of breath and wheezing.   Cardiovascular: Positive for leg swelling. Negative for chest pain and palpitations.  Gastrointestinal: Negative for abdominal distention, abdominal pain, constipation, diarrhea, nausea and vomiting.  Musculoskeletal: Positive for arthralgias and gait problem.  Skin: Negative for color change, pallor and rash.       Left leg blister and redness   Neurological: Negative for dizziness, speech difficulty, weakness, light-headedness, numbness and headaches.  Psychiatric/Behavioral: Negative for agitation, confusion and sleep disturbance. The patient is not nervous/anxious.     Immunization History  Administered Date(s) Administered  . Influenza Split 01/07/2011  . Influenza Whole 01/16/2009, 01/23/2010  . Influenza, High Dose Seasonal PF 01/06/2013, 01/15/2019  . Influenza,inj,Quad PF,6+ Mos 02/17/2015, 01/30/2018  . Influenza,inj,quad, With Preservative 01/06/2017  . Influenza-Unspecified 01/06/2014, 02/01/2016  . Moderna SARS-COVID-2 Vaccination 04/20/2019, 05/18/2019  . Pneumococcal Conjugate-13 02/24/2019  . Pneumococcal-Unspecified 11/23/2013  . Tdap 02/24/2019  . Zoster Recombinat (Shingrix) 03/13/2017, 05/20/2017   Pertinent  Health Maintenance Due  Topic Date Due  . DEXA SCAN  Never done  . INFLUENZA VACCINE  11/07/2019  . PNA vac Low Risk Adult  Completed   Fall Risk  01/25/2020 01/12/2020 11/10/2019 09/08/2019 07/07/2019  Falls in the past year? 0 0 0 0 0  Number falls in past yr: 0 0 0 0 0  Injury with Fall? 0 0 0 0 0   Functional Status Survey:    Vitals:   01/25/20 1002  BP: 112/60  Pulse: 78  Temp: 97.7 F (36.5 C)  TempSrc: Temporal  SpO2: 97%  Weight: 136 lb 3.2 oz (61.8 kg)  Height: 5\' 4"  (1.626 m)   Body mass index is 23.38 kg/m. Physical Exam Vitals reviewed.  Constitutional:      General: She is not in acute  distress.    Appearance: She is normal weight. She is not ill-appearing.  HENT:     Head: Normocephalic.     Nose: Nose normal. No congestion or rhinorrhea.     Mouth/Throat:     Mouth: Mucous membranes are moist.     Pharynx: Oropharynx is clear. No oropharyngeal exudate or posterior oropharyngeal erythema.  Eyes:     General: No scleral icterus.       Right eye: No discharge.        Left eye: No discharge.  Extraocular Movements: Extraocular movements intact.     Conjunctiva/sclera: Conjunctivae normal.     Pupils: Pupils are equal, round, and reactive to light.  Cardiovascular:     Rate and Rhythm: Normal rate and regular rhythm.     Pulses: Normal pulses.     Heart sounds: Normal heart sounds. No murmur heard.  No friction rub. No gallop.   Pulmonary:     Effort: Pulmonary effort is normal. No respiratory distress.     Breath sounds: Normal breath sounds. No wheezing, rhonchi or rales.  Chest:     Chest wall: No tenderness.  Abdominal:     General: Bowel sounds are normal. There is no distension.     Palpations: Abdomen is soft. There is no mass.     Tenderness: There is no abdominal tenderness. There is no right CVA tenderness, left CVA tenderness, guarding or rebound.  Musculoskeletal:        General: No swelling or tenderness.     Comments: 1 + edema bilateral lower extremities  Skin:    General: Skin is warm and dry.     Coloration: Skin is not pale.     Findings: Bruising and erythema present. No rash.     Comments: Left lower lateral leg 4 cm grayish colored vesicle with surrounding skin erythema tender to touch. No foreigh object noted.site cleansed and blister covered with foam dressing for absorption and protection.   Neurological:     Mental Status: She is alert and oriented to person, place, and time.     Cranial Nerves: No cranial nerve deficit.     Sensory: No sensory deficit.     Motor: No weakness.     Coordination: Coordination normal.     Gait: Gait  abnormal.     Comments: HOH   Psychiatric:        Mood and Affect: Mood normal.        Behavior: Behavior normal.        Thought Content: Thought content normal.        Judgment: Judgment normal.     Labs reviewed: Recent Labs    09/20/19 1622 10/27/19 1928 10/28/19 0028  NA 140 142 139  K 4.3 3.5 3.6  CL 99 100 100  CO2 27 29 25   GLUCOSE 101* 102* 136*  BUN 19 21 19   CREATININE 1.20* 0.91 0.96  CALCIUM 9.7 9.4 9.3  MG  --  2.5*  --   PHOS  --  3.0  --    Recent Labs    10/27/19 1928  AST 35  ALT 26  ALKPHOS 57  BILITOT 1.0  PROT 7.1  ALBUMIN 4.3   Recent Labs    07/03/19 0039 10/27/19 1928 10/28/19 0028  WBC 6.7 4.9 4.6  NEUTROABS 4.5 3.3 4.0  HGB 13.9 13.1 13.5  HCT 43.6 40.8 42.3  MCV 99.3 99.0 100.0  PLT 268 229 225   Lab Results  Component Value Date   TSH 1.05 04/30/2016   Lab Results  Component Value Date   HGBA1C 5.7 (H) 06/25/2017   Lab Results  Component Value Date   CHOL 138 11/04/2017   HDL 70 11/04/2017   LDLCALC 58 11/04/2017   TRIG 53 11/04/2017   CHOLHDL 3.1 06/25/2017    Significant Diagnostic Results in last 30 days:  No results found.  Assessment/Plan  Cellulitis of left lower extremity Afebrile. Left lower lateral leg 4 cm grayish colored vesicle with surrounding skin erythema tender to touch.  No foreigh object noted.site cleansed and blister covered with foam dressing for absorption and protection. - For home use only DME Other see comment Facility Nurse to cleanse left leg area with saline,pat dry and cover blister with foam dressing for absorption and protection. - Notify provider if symptoms worsen or fail to improve. - Orders faxed by CMA Harley Hallmark  to [facility] facility for wound care  - Advised to follow up with Dr.Reed at the facility In 1 week.  - Doxycycline 100 mg tablet one by mouth twice daily x 10 days Along with Florastor 250 mg capsule one by mouth twice daily x 10 days.   Family/ staff  Communication: Reviewed plan of care with patient  Labs/tests ordered: None   Next Appointment: 1 week with Dr.Reed at [facility].  Sandrea Hughs, NP

## 2020-01-25 NOTE — Patient Instructions (Signed)
Please have Nurse at the facility change left leg dressing once daily.Notify provider if symptoms worsen or fail to improve.

## 2020-02-02 ENCOUNTER — Other Ambulatory Visit: Payer: Self-pay

## 2020-02-02 ENCOUNTER — Non-Acute Institutional Stay: Payer: Medicare Other | Admitting: Internal Medicine

## 2020-02-02 ENCOUNTER — Encounter: Payer: Self-pay | Admitting: Internal Medicine

## 2020-02-02 VITALS — BP 118/60 | HR 82 | Temp 97.5°F | Ht 64.0 in | Wt 134.4 lb

## 2020-02-02 DIAGNOSIS — L03116 Cellulitis of left lower limb: Secondary | ICD-10-CM | POA: Diagnosis not present

## 2020-02-02 DIAGNOSIS — I872 Venous insufficiency (chronic) (peripheral): Secondary | ICD-10-CM | POA: Diagnosis not present

## 2020-02-02 DIAGNOSIS — S81812S Laceration without foreign body, left lower leg, sequela: Secondary | ICD-10-CM

## 2020-02-02 NOTE — Progress Notes (Signed)
Location:  Occupational psychologist of Service:  Clinic (12)  Provider: Janaria Mccammon L. Mariea Clonts, D.O., C.M.D.  Code Status: DNR Goals of Care:  Advanced Directives 01/25/2020  Does Patient Have a Medical Advance Directive? Yes  Type of Paramedic of [place];Out of facility DNR (pink MOST or yellow form)  Does patient want to make changes to medical advance directive? No - Patient declined  Copy of [place] in Chart? Yes - validated most recent copy scanned in chart (See row information)  Would patient like information on creating a medical advance directive? -  Pre-existing out of facility DNR order (yellow form or pink MOST form) Pink MOST/Yellow Form most recent copy in chart - Physician notified to receive inpatient order     Chief Complaint  Patient presents with  . Acute Visit    Follow up on leg cellulitus     HPI: Patient is a 86 y.o. female seen today for acute visit for left leg cellulitis.  She's already on antibiotics.  She'd had a vein "pop" when she was getting a pedicure.  Her left anterior shin has an open wound with a very black area where it appears the open skin has become necrotic.  She continues to have some swelling of the leg.  There is minimal erythema around the wound now.  She does note tenderness on exam.  Clinic nurse has been changing a polymem on the leg and plans to reassess her on Monday.    Past Medical History:  Diagnosis Date  . Anemia   . Aortic stenosis    a. Echo 09/06/12 EF 55-60%, no WMA, G2DD, Ao valve sclerosis w/ mod stenosis, LA mildly dilated, PA pressure 42mmHg  . Asthmatic bronchitis   . Complete heart block [facility])    s/p permanent pacemaker 06/27/1999 (Battery change 06/2007 and 2016).  s/p AV nodal ablation by Dr [name] 2016.  [name] COPD (chronic obstructive pulmonary disease) (HCC)    Dr. [name]  . DDD (degenerative disc disease)   . Diastolic dysfunction    a. Echo 09/06/12 EF 55-60%,  no WMA, G2DD, Ao valve sclerosis w/ mod stenosis, LA mildly dilated, PA pressure 88mmHg  . Diverticulosis   . DVT (deep vein thrombosis) in pregnancy 1954   a. LLE  . Hypertension   . Hypothyroidism    on medication  . Macular degeneration    Dr. [name]  . Osteoarthrosis, unspecified whether generalized or localized, other specified sites   . PAF (paroxysmal atrial fibrillation) (HCC)    Stopped flecainide, on amiodarone but still has bouts of A FIB (mostly in mornings).   . Pure hypercholesterolemia   . Staghorn calculus    Left    Past Surgical History:  Procedure Laterality Date  . APPENDECTOMY  ~ 1941  . AV NODE ABLATION  05/10/2014  . AV NODE ABLATION N/A 05/10/2014   Procedure: AV NODE ABLATION;  Surgeon: Thompson Grayer, MD;  Location: [facility] CATH LAB;  Service: Cardiovascular;  Laterality: N/A;  . BUNIONECTOMY WITH HAMMERTOE RECONSTRUCTION Bilateral ~ 1990  . CARDIAC PACEMAKER PLACEMENT  06/27/99   Medtronic PM implanted by Dr [name]  . CARDIOVERSION N/A 12/02/2013   Procedure: CARDIOVERSION;  Surgeon: [name], MD;  Location: [facility] ENDOSCOPY;  Service: Cardiovascular;  Laterality: N/A;  . CATARACT EXTRACTION W/ INTRAOCULAR LENS  IMPLANT, BILATERAL Bilateral   . COLONOSCOPY WITH PROPOFOL N/A 04/22/2013   Procedure: COLONOSCOPY WITH PROPOFOL;  Surgeon: [name], MD;  Location: [facility] ENDOSCOPY;  Service: Endoscopy;  Laterality: N/A;  . DILATION AND CURETTAGE OF UTERUS  X 2   "when I was going thru menopause"  . ESOPHAGOGASTRODUODENOSCOPY (EGD) WITH PROPOFOL N/A 04/22/2013   Procedure: ESOPHAGOGASTRODUODENOSCOPY (EGD) WITH PROPOFOL;  Surgeon: [name], MD;  Location: [facility] ENDOSCOPY;  Service: Endoscopy;  Laterality: N/A;  . HERNIA REPAIR    . INCISIONAL HERNIA REPAIR    . INSERT / REPLACE / REMOVE PACEMAKER  06/2007   "took out the old; put in new"  . INSERT / REPLACE / REMOVE PACEMAKER  05/10/2014   MDT PPM generator change by Dr [name]  . JOINT REPLACEMENT    . PARTIAL  NEPHRECTOMY Left 05/1974   stone disease  . PERMANENT PACEMAKER GENERATOR CHANGE N/A 05/10/2014   Procedure: PERMANENT PACEMAKER GENERATOR CHANGE;  Surgeon: Thompson Grayer, MD;  Location: [facility] CATH LAB;  Service: Cardiovascular;  Laterality: N/A;  . TONSILLECTOMY AND ADENOIDECTOMY  1930's  . TOTAL KNEE ARTHROPLASTY Right 2001    Allergies  Allergen Reactions  . Brimonidine Tartrate-Timolol Other (See Comments)    REACTION: systemic malaise amigen eye drop  . Penicillins Hives  . Breo Ellipta [Fluticasone Furoate-Vilanterol] Other (See Comments)    Ran blood pressure up Increased blood pressure    Outpatient Encounter Medications as of 02/02/2020  Medication Sig  . amLODipine (NORVASC) 5 MG tablet TAKE 1 TABLET BY MOUTH  DAILY  . atorvastatin (LIPITOR) 40 MG tablet TAKE 1 TABLET BY MOUTH ONCE DAILY FOR CHOLESTEROL  . azelastine (ASTELIN) 0.1 % nasal spray USE 1 TO 2 SPRAYS INTO BOTH NOSTRILS TWICE DAILY.  [name] Cholecalciferol (VITAMIN D) 2000 units CAPS Take 1 capsule by mouth daily.  . fluticasone (FLONASE) 50 MCG/ACT nasal spray USE 2 SPRAYS EACH NOSTRIL ONCE A DAY AS NEEDED FOR ALLERGIES OR CONGESTION.  . furosemide (LASIX) 40 MG tablet Take 1.5 tablets (60 mg total) by mouth daily. Ok to use an extra dose of 40 mg for weight gain  . gabapentin (NEURONTIN) 100 MG capsule TAKE 1 CAPSULE BY MOUTH AT  BEDTIME  . latanoprost (XALATAN) 0.005 % ophthalmic solution Place 1 drop into both eyes at bedtime.  [name] levothyroxine (SYNTHROID) 88 MCG tablet TAKE 1 TABLET BY MOUTH  DAILY BEFORE BREAKFAST  . losartan (COZAAR) 100 MG tablet Take 100 mg by mouth 2 (two) times daily.  . LUTEIN PO Take 25 mg by mouth daily.   . Magnesium 250 MG TABS Take 1 tablet by mouth daily.  . melatonin 3 MG TABS tablet Take 1 tablet (3 mg total) by mouth at bedtime.  . metoprolol succinate (TOPROL-XL) 100 MG 24 hr tablet Take 100 mg by mouth daily.  . montelukast (SINGULAIR) 10 MG tablet TAKE 1 TABLET BY MOUTH  DAILY  .  Multiple Vitamins-Minerals (PRESERVISION AREDS 2) CAPS Take 1 capsule by mouth in the morning and at bedtime.   . potassium chloride (KLOR-CON) 10 MEQ tablet Take 10 mEq by mouth daily.  . potassium chloride SA (KLOR-CON) 20 MEQ tablet Take 20 mEq by mouth daily.  . predniSONE (DELTASONE) 5 MG tablet Take 1 tablet (5 mg total) by mouth daily with breakfast.  . Rivaroxaban (XARELTO) 15 MG TABS tablet TAKE 1 TABLET BY MOUTH  DAILY WITH SUPPER  . SYMBICORT 160-4.5 MCG/ACT inhaler INHALE 2 PUFFS BY MOUTH  INTO THE LUNGS TWO TIMES  DAILY  . VENTOLIN HFA 108 (90 Base) MCG/ACT inhaler USE 2 PUFFS EVERY 6 HOURS AS NEEDED FOR SHORTNESS OF BREATH AND  WHEEZING.  . [DISCONTINUED] cephALEXin (KEFLEX) 500 MG capsule Take 1 capsule (500 mg total) by mouth 2 (two) times daily.  . [DISCONTINUED] doxycycline (VIBRA-TABS) 100 MG tablet Take 1 tablet (100 mg total) by mouth 2 (two) times daily.  . [DISCONTINUED] saccharomyces boulardii (FLORASTOR) 250 MG capsule Take 1 capsule (250 mg total) by mouth 2 (two) times daily.   No facility-administered encounter medications on file as of 02/02/2020.    Review of Systems:  Review of Systems  Constitutional: Negative for chills and fever.  HENT: Positive for hearing loss.   Eyes: Positive for blurred vision.  Respiratory: Negative for shortness of breath and wheezing.   Cardiovascular: Positive for leg swelling. Negative for chest pain and palpitations.  Gastrointestinal: Negative for abdominal pain.  Musculoskeletal: Negative for falls.  Skin:       Left anterior shin with skin tear with necrosis of skin flap, mild erythema around wound with tenderness, mild 1+ pitting edema--was wearing compression socks on both legs; also has large ecchymoses along lateral aspect of her lower leg; has large thick varicose vein running up her medial thigh  Neurological: Negative for loss of consciousness.  Endo/Heme/Allergies: Bruises/bleeds easily.  Psychiatric/Behavioral:  Negative for memory loss.    Health Maintenance  Topic Date Due  . DEXA SCAN  Never done  . INFLUENZA VACCINE  11/07/2019  . TETANUS/TDAP  02/23/2029  . COVID-19 Vaccine  Completed  . PNA vac Low Risk Adult  Completed    Physical Exam: Vitals:   02/02/20 1403  BP: 118/60  Pulse: 82  Temp: (!) 97.5 F (36.4 C)  TempSrc: Temporal  SpO2: 97%  Weight: 134 lb 6.4 oz (61 kg)  Height: 5\' 4"  (1.626 m)   Body mass index is 23.07 kg/m. Physical Exam Vitals reviewed.  Constitutional:      General: She is not in acute distress. HENT:     Head: Normocephalic and atraumatic.     Ears:     Comments: HOH, uses hearing aids Eyes:     Comments: glasses  Cardiovascular:     Rate and Rhythm: Rhythm irregular.     Heart sounds: No friction rub. No gallop.   Pulmonary:     Effort: Pulmonary effort is normal.     Breath sounds: No wheezing.  Abdominal:     General: Bowel sounds are normal.  Musculoskeletal:     Right lower leg: Edema present.     Left lower leg: Edema present.  Skin:    Comments: See above for description of wound  Neurological:     General: No focal deficit present.     Mental Status: She is alert and oriented to person, place, and time.  Psychiatric:        Mood and Affect: Mood normal.        Behavior: Behavior normal.     Labs reviewed: Basic Metabolic Panel: Recent Labs    09/20/19 1622 10/27/19 1928 10/28/19 0028  NA 140 142 139  K 4.3 3.5 3.6  CL 99 100 100  CO2 27 29 25   GLUCOSE 101* 102* 136*  BUN 19 21 19   CREATININE 1.20* 0.91 0.96  CALCIUM 9.7 9.4 9.3  MG  --  2.5*  --   PHOS  --  3.0  --    Liver Function Tests: Recent Labs    10/27/19 1928  AST 35  ALT 26  ALKPHOS 57  BILITOT 1.0  PROT 7.1  ALBUMIN 4.3   No results for  input(s): LIPASE, AMYLASE in the last 8760 hours. No results for input(s): AMMONIA in the last 8760 hours. CBC: Recent Labs    07/03/19 0039 10/27/19 1928 10/28/19 0028  WBC 6.7 4.9 4.6  NEUTROABS  4.5 3.3 4.0  HGB 13.9 13.1 13.5  HCT 43.6 40.8 42.3  MCV 99.3 99.0 100.0  PLT 268 229 225   Lipid Panel: No results for input(s): CHOL, HDL, LDLCALC, TRIG, CHOLHDL, LDLDIRECT in the last 8760 hours. Lab Results  Component Value Date   HGBA1C 5.7 (H) 06/25/2017    Assessment/Plan 1. Cellulitis of left lower extremity -improving, complete course of abx as planned (keflex)  2. Laceration of left lower extremity, sequela -has not healed, cont polymem but applied coban wrap toes to knee on left leg -clinic nurse to reassess on Monday and send me updated photos in tiger text hipaa compliant texting -may wrap leg with saran wrap if wants to shower or bathe at sink until wrap removed   3. Chronic venous insufficiency -cont right compression socks and wrap on right leg  Labs/tests ordered: no new Next appt:  02/15/2020  Exodus Kutzer L. Kayston Jodoin, D.O. [facility] [address], [place] [address] Cell Phone (Mon-Fri 8am-5pm):  [phone] On Call:  [phone] & follow prompts after 5pm & weekends Office Phone:  [phone] Office Fax:  [phone]

## 2020-02-07 ENCOUNTER — Encounter: Payer: 59 | Admitting: Family

## 2020-02-07 ENCOUNTER — Ambulatory Visit (INDEPENDENT_AMBULATORY_CARE_PROVIDER_SITE_OTHER): Payer: Medicare Other | Admitting: Orthopedic Surgery

## 2020-02-07 ENCOUNTER — Other Ambulatory Visit: Payer: Self-pay

## 2020-02-07 ENCOUNTER — Encounter: Payer: Self-pay | Admitting: Orthopedic Surgery

## 2020-02-07 VITALS — BP 100/60 | HR 82 | Temp 96.8°F | Resp 20 | Ht 64.0 in | Wt 134.0 lb

## 2020-02-07 DIAGNOSIS — S81812S Laceration without foreign body, left lower leg, sequela: Secondary | ICD-10-CM

## 2020-02-07 DIAGNOSIS — I872 Venous insufficiency (chronic) (peripheral): Secondary | ICD-10-CM

## 2020-02-07 DIAGNOSIS — L03116 Cellulitis of left lower limb: Secondary | ICD-10-CM | POA: Diagnosis not present

## 2020-02-07 NOTE — Progress Notes (Signed)
Careteam: Patient Care Team: Gayland Curry, DO as PCP - General (Geriatric Medicine) Jerline Pain, MD as PCP - Cardiology (Cardiology) Thompson Grayer, MD as Consulting Physician (Cardiology) Deneise Lever, MD as Consulting Physician (Pulmonary Disease) Rutherford Guys, MD as Consulting Physician (Ophthalmology) [name], MD as Consulting Physician (Gastroenterology) Gaynelle Arabian, MD as Consulting Physician (Orthopedic Surgery)  PLACE OF SERVICE:  [place]  Advanced Directive information    Allergies  Allergen Reactions  . Brimonidine Tartrate-Timolol Other (See Comments)    REACTION: systemic malaise amigen eye drop  . Penicillins Hives  . Breo Ellipta [Fluticasone Furoate-Vilanterol] Other (See Comments)    Ran blood pressure up Increased blood pressure    No chief complaint on file.    HPI: Patient is a 86 y.o. female seen today for acute visit of left leg cellulitis. Initial symptoms of redness and leg swelling began Oct 19 th, 2021. She was seen at [facility] and started on doxycycline. Also seen by Dr. Mariea Clonts at [facility] on October 27 th for left anterior shin wound with necrosis. Since initial visit, she has completed a course of doxycycline. She has also been instructed to continue using compression stockings.   This morning [facility] staff noticed her left lower extremity appeared warm to touch. She was advised to seek medical advise from [facility]. She has a previous history of blood clot during pregnancy. Patient denies pain, increased swelling, chest pain, and trouble breathing. She denies any recent periods of prolonged sitting. Last dressing change was this morning.     Review of Systems:  Review of Systems  Constitutional: Negative for fever.  Respiratory: Negative for shortness of breath.   Cardiovascular: Negative for chest pain.  Musculoskeletal: Negative for falls, joint pain and myalgias.  Skin:       Left anterior shin abrasion     Past Medical History:  Diagnosis Date  . Anemia   . Aortic stenosis    a. Echo 09/06/12 EF 55-60%, no WMA, G2DD, Ao valve sclerosis w/ mod stenosis, LA mildly dilated, PA pressure 107mmHg  . Asthmatic bronchitis   . Complete heart block [facility])    s/p permanent pacemaker 06/27/1999 (Battery change 06/2007 and 2016).  s/p AV nodal ablation by Dr [name] 2016.  [name] COPD (chronic obstructive pulmonary disease) (HCC)    Dr. [name]  . DDD (degenerative disc disease)   . Diastolic dysfunction    a. Echo 09/06/12 EF 55-60%, no WMA, G2DD, Ao valve sclerosis w/ mod stenosis, LA mildly dilated, PA pressure 61mmHg  . Diverticulosis   . DVT (deep vein thrombosis) in pregnancy 1954   a. LLE  . Hypertension   . Hypothyroidism    on medication  . Macular degeneration    Dr. [name]  . Osteoarthrosis, unspecified whether generalized or localized, other specified sites   . PAF (paroxysmal atrial fibrillation) (HCC)    Stopped flecainide, on amiodarone but still has bouts of A FIB (mostly in mornings).   . Pure hypercholesterolemia   . Staghorn calculus    Left   Past Surgical History:  Procedure Laterality Date  . APPENDECTOMY  ~ 1941  . AV NODE ABLATION  05/10/2014  . AV NODE ABLATION N/A 05/10/2014   Procedure: AV NODE ABLATION;  Surgeon: Thompson Grayer, MD;  Location: [facility] CATH LAB;  Service: Cardiovascular;  Laterality: N/A;  . BUNIONECTOMY WITH HAMMERTOE RECONSTRUCTION Bilateral ~ 1990  . CARDIAC PACEMAKER PLACEMENT  06/27/99   Medtronic PM implanted by  Dr [name]  . CARDIOVERSION N/A 12/02/2013   Procedure: CARDIOVERSION;  Surgeon: [name], MD;  Location: [facility] ENDOSCOPY;  Service: Cardiovascular;  Laterality: N/A;  . CATARACT EXTRACTION W/ INTRAOCULAR LENS  IMPLANT, BILATERAL Bilateral   . COLONOSCOPY WITH PROPOFOL N/A 04/22/2013   Procedure: COLONOSCOPY WITH PROPOFOL;  Surgeon: [name], MD;  Location: [facility] ENDOSCOPY;  Service: Endoscopy;  Laterality: N/A;  . DILATION AND CURETTAGE OF UTERUS  X  2   "when I was going thru menopause"  . ESOPHAGOGASTRODUODENOSCOPY (EGD) WITH PROPOFOL N/A 04/22/2013   Procedure: ESOPHAGOGASTRODUODENOSCOPY (EGD) WITH PROPOFOL;  Surgeon: [name], MD;  Location: [facility] ENDOSCOPY;  Service: Endoscopy;  Laterality: N/A;  . HERNIA REPAIR    . INCISIONAL HERNIA REPAIR    . INSERT / REPLACE / REMOVE PACEMAKER  06/2007   "took out the old; put in new"  . INSERT / REPLACE / REMOVE PACEMAKER  05/10/2014   MDT PPM generator change by Dr [name]  . JOINT REPLACEMENT    . PARTIAL NEPHRECTOMY Left 05/1974   stone disease  . PERMANENT PACEMAKER GENERATOR CHANGE N/A 05/10/2014   Procedure: PERMANENT PACEMAKER GENERATOR CHANGE;  Surgeon: Thompson Grayer, MD;  Location: [facility] CATH LAB;  Service: Cardiovascular;  Laterality: N/A;  . TONSILLECTOMY AND ADENOIDECTOMY  1930's  . TOTAL KNEE ARTHROPLASTY Right 2001   Social History:   reports that she has never smoked. She has never used smokeless tobacco. She reports current alcohol use of about 7.0 standard drinks of alcohol per week. She reports that she does not use drugs.  Family History  Problem Relation Age of Onset  . Malignant hypertension Father   . Hypertension Father   . Renal Disease Father   . Breast cancer Mother   . Heart attack Brother   . Stroke Brother     Medications: Patient's Medications  New Prescriptions   No medications on file  Previous Medications   AMLODIPINE (NORVASC) 5 MG TABLET    TAKE 1 TABLET BY MOUTH  DAILY   ATORVASTATIN (LIPITOR) 40 MG TABLET    TAKE 1 TABLET BY MOUTH ONCE DAILY FOR CHOLESTEROL   AZELASTINE (ASTELIN) 0.1 % NASAL SPRAY    USE 1 TO 2 SPRAYS INTO BOTH NOSTRILS TWICE DAILY.   CHOLECALCIFEROL (VITAMIN D) 2000 UNITS CAPS    Take 1 capsule by mouth daily.   FLUTICASONE (FLONASE) 50 MCG/ACT NASAL SPRAY    USE 2 SPRAYS EACH NOSTRIL ONCE A DAY AS NEEDED FOR ALLERGIES OR CONGESTION.   FUROSEMIDE (LASIX) 40 MG TABLET    Take 1.5 tablets (60 mg total) by mouth daily. Ok to use an  extra dose of 40 mg for weight gain   GABAPENTIN (NEURONTIN) 100 MG CAPSULE    TAKE 1 CAPSULE BY MOUTH AT  BEDTIME   LATANOPROST (XALATAN) 0.005 % OPHTHALMIC SOLUTION    Place 1 drop into both eyes at bedtime.   LEVOTHYROXINE (SYNTHROID) 88 MCG TABLET    TAKE 1 TABLET BY MOUTH  DAILY BEFORE BREAKFAST   LOSARTAN (COZAAR) 100 MG TABLET    Take 100 mg by mouth 2 (two) times daily.   LUTEIN PO    Take 25 mg by mouth daily.    MAGNESIUM 250 MG TABS    Take 1 tablet by mouth daily.   MELATONIN 3 MG TABS TABLET    Take 1 tablet (3 mg total) by mouth at bedtime.   METOPROLOL SUCCINATE (TOPROL-XL) 100 MG 24 HR TABLET    Take  100 mg by mouth daily.   MONTELUKAST (SINGULAIR) 10 MG TABLET    TAKE 1 TABLET BY MOUTH  DAILY   MULTIPLE VITAMINS-MINERALS (PRESERVISION AREDS 2) CAPS    Take 1 capsule by mouth in the morning and at bedtime.    POTASSIUM CHLORIDE (KLOR-CON) 10 MEQ TABLET    Take 10 mEq by mouth daily.   POTASSIUM CHLORIDE SA (KLOR-CON) 20 MEQ TABLET    Take 20 mEq by mouth daily.   PREDNISONE (DELTASONE) 5 MG TABLET    Take 1 tablet (5 mg total) by mouth daily with breakfast.   RIVAROXABAN (XARELTO) 15 MG TABS TABLET    TAKE 1 TABLET BY MOUTH  DAILY WITH SUPPER   SYMBICORT 160-4.5 MCG/ACT INHALER    INHALE 2 PUFFS BY MOUTH  INTO THE LUNGS TWO TIMES  DAILY   VENTOLIN HFA 108 (90 BASE) MCG/ACT INHALER    USE 2 PUFFS EVERY 6 HOURS AS NEEDED FOR SHORTNESS OF BREATH AND WHEEZING.  Modified Medications   No medications on file  Discontinued Medications   No medications on file    Physical Exam:  There were no vitals filed for this visit. There is no height or weight on file to calculate BMI. Wt Readings from Last 3 Encounters:  02/02/20 134 lb 6.4 oz (61 kg)  01/25/20 136 lb 3.2 oz (61.8 kg)  01/12/20 135 lb 6.4 oz (61.4 kg)    Physical Exam Vitals and nursing note reviewed.  Constitutional:      Appearance: Normal appearance.  Cardiovascular:     Pulses: Normal pulses.     Heart  sounds: Normal heart sounds.  Pulmonary:     Effort: Pulmonary effort is normal. No respiratory distress.     Breath sounds: Normal breath sounds.  Musculoskeletal:     Right lower leg: Edema present.     Left lower leg: Edema present.  Skin:    General: Skin is dry.     Capillary Refill: Capillary refill takes less than 2 seconds.     Findings: No erythema or rash.     Comments: Quarter sized necrotic abrasion on Left anterior shin. Surrounding tissue absent of erythema. Extremity cool touch. Patient denies pain with palpation. Minimal serosanguinous drainage present. Abrasion covered with Polymem. Minimal 1+ edema on LLE present.   Neurological:     Mental Status: She is alert.     Labs reviewed: Basic Metabolic Panel: Recent Labs    09/20/19 1622 10/27/19 1928 10/28/19 0028  NA 140 142 139  K 4.3 3.5 3.6  CL 99 100 100  CO2 27 29 25   GLUCOSE 101* 102* 136*  BUN 19 21 19   CREATININE 1.20* 0.91 0.96  CALCIUM 9.7 9.4 9.3  MG  --  2.5*  --   PHOS  --  3.0  --    Liver Function Tests: Recent Labs    10/27/19 1928  AST 35  ALT 26  ALKPHOS 57  BILITOT 1.0  PROT 7.1  ALBUMIN 4.3   No results for input(s): LIPASE, AMYLASE in the last 8760 hours. No results for input(s): AMMONIA in the last 8760 hours. CBC: Recent Labs    07/03/19 0039 10/27/19 1928 10/28/19 0028  WBC 6.7 4.9 4.6  NEUTROABS 4.5 3.3 4.0  HGB 13.9 13.1 13.5  HCT 43.6 40.8 42.3  MCV 99.3 99.0 100.0  PLT 268 229 225   Lipid Panel: No results for input(s): CHOL, HDL, LDLCALC, TRIG, CHOLHDL, LDLDIRECT in the last 8760 hours. TSH:  No results for input(s): TSH in the last 8760 hours. A1C: Lab Results  Component Value Date   HGBA1C 5.7 (H) 06/25/2017     Assessment/Plan 1. Cellulitis of left lower extremity -  suspect chronic venous insufficiency delaying healing process of left lower extremity - LLE absent of fever and purulent drainage  - do not recommend additional antibiotics that this  time - continue to use compression stockings and elevate extremities > 1hour daily - will instruct [facility] staff to check LLE every Monday and Thursday  2. Laceration of left lower extremity, sequela - see above  - continue polymem dressing every Monday and Thursday  3. Chronic venous insufficiency - continue to use compression stockings on right leg  Next appt: 02/15/2020 Windell Moulding, [facility] [phone]

## 2020-02-07 NOTE — Patient Instructions (Signed)
Please have nursing staff check leg every Monday and Thursday for next 2 weeks.   Please seek medical care if increased redness, pain, or trouble breathing occur

## 2020-02-15 ENCOUNTER — Other Ambulatory Visit: Payer: Self-pay

## 2020-02-15 ENCOUNTER — Ambulatory Visit (INDEPENDENT_AMBULATORY_CARE_PROVIDER_SITE_OTHER): Payer: Medicare Other | Admitting: Family

## 2020-02-15 ENCOUNTER — Encounter: Payer: Self-pay | Admitting: Family

## 2020-02-15 ENCOUNTER — Telehealth: Payer: Self-pay

## 2020-02-15 DIAGNOSIS — Z0000 Encounter for general adult medical examination without abnormal findings: Secondary | ICD-10-CM | POA: Diagnosis not present

## 2020-02-15 NOTE — Progress Notes (Signed)
This service is provided via telemedicine  No vital signs collected/recorded due to the encounter was a telemedicine visit.   Location of patient (ex: home, work): Home.  Patient consents to a telephone visit: Yes.   Location of the provider (ex: office, home): [facility].  Name of any referring provider: Gayland Curry, DO   Names of all persons participating in the telemedicine service and their role in the encounter: Patient, Heriberto Antigua, [place], [place], Webb Silversmith, NP.    Time spent on call: 8 minutes spent on the phone with Medical Assistant.      Subjective:   Jody Blake is a 86 y.o. female who presents for Medicare Annual (Subsequent) preventive examination.  Review of Systems     Cardiac Risk Factors include: advanced age (>67men, >58 women);hypertension;family history of premature cardiovascular disease     Objective:    There were no vitals filed for this visit. There is no height or weight on file to calculate BMI.  Advanced Directives 02/15/2020 02/07/2020 02/02/2020 01/25/2020 01/12/2020 11/10/2019 10/28/2019  Does Patient Have a Medical Advance Directive? Yes Yes Yes Yes Yes - Yes  Type of Advance Directive [place];Living will;Out of facility DNR (pink MOST or yellow form) [place];Living will;Out of facility DNR (pink MOST or yellow form) Out of facility DNR (pink MOST or yellow form);Healthcare Power of [place];Out of facility DNR (pink MOST or yellow form) [place];Out of facility DNR (pink MOST or yellow form) Healthcare Power of [place]  Does patient want to make changes to medical advance directive? No - Patient declined No - Patient declined No - Guardian declined No - Patient declined No - Patient declined No - Guardian declined No - Patient declined  Copy of [place] in Chart? Yes - validated most recent copy  scanned in chart (See row information) Yes - validated most recent copy scanned in chart (See row information) Yes - validated most recent copy scanned in chart (See row information) Yes - validated most recent copy scanned in chart (See row information) Yes - validated most recent copy scanned in chart (See row information) - No - copy requested  Would patient like information on creating a medical advance directive? - - - - - - -  Pre-existing out of facility DNR order (yellow form or pink MOST form) - Yellow form placed in chart (order not valid for inpatient use) Pink MOST/Yellow Form most recent copy in chart - Physician notified to receive inpatient order Pink MOST/Yellow Form most recent copy in chart - Physician notified to receive inpatient order Pink MOST/Yellow Form most recent copy in chart - Physician notified to receive inpatient order - -    Current Medications (verified) Outpatient Encounter Medications as of 02/15/2020  Medication Sig  . amLODipine (NORVASC) 5 MG tablet TAKE 1 TABLET BY MOUTH  DAILY  . atorvastatin (LIPITOR) 40 MG tablet TAKE 1 TABLET BY MOUTH ONCE DAILY FOR CHOLESTEROL  . azelastine (ASTELIN) 0.1 % nasal spray USE 1 TO 2 SPRAYS INTO BOTH NOSTRILS TWICE DAILY.  [name] Cholecalciferol (VITAMIN D) 2000 units CAPS Take 1 capsule by mouth daily.  . fluticasone (FLONASE) 50 MCG/ACT nasal spray USE 2 SPRAYS EACH NOSTRIL ONCE A DAY AS NEEDED FOR ALLERGIES OR CONGESTION.  . furosemide (LASIX) 40 MG tablet Take 1.5 tablets (60 mg total) by mouth daily. Ok to use an extra dose of 40  mg for weight gain  . gabapentin (NEURONTIN) 100 MG capsule TAKE 1 CAPSULE BY MOUTH AT  BEDTIME  . latanoprost (XALATAN) 0.005 % ophthalmic solution Place 1 drop into both eyes at bedtime.  [name] levothyroxine (SYNTHROID) 88 MCG tablet TAKE 1 TABLET BY MOUTH  DAILY BEFORE BREAKFAST  . losartan (COZAAR) 100 MG tablet Take 100 mg by mouth 2 (two) times daily.  . LUTEIN PO Take 25 mg by mouth daily.   .  Magnesium 250 MG TABS Take 1 tablet by mouth daily.  . melatonin 3 MG TABS tablet Take 1 tablet (3 mg total) by mouth at bedtime.  . metoprolol succinate (TOPROL-XL) 100 MG 24 hr tablet Take 100 mg by mouth daily.  . montelukast (SINGULAIR) 10 MG tablet TAKE 1 TABLET BY MOUTH  DAILY  . Multiple Vitamins-Minerals (PRESERVISION AREDS 2) CAPS Take 1 capsule by mouth in the morning and at bedtime.   . potassium chloride (KLOR-CON) 10 MEQ tablet Take 10 mEq by mouth daily.  . potassium chloride SA (KLOR-CON) 20 MEQ tablet Take 20 mEq by mouth daily.  . predniSONE (DELTASONE) 5 MG tablet Take 1 tablet (5 mg total) by mouth daily with breakfast.  . Rivaroxaban (XARELTO) 15 MG TABS tablet TAKE 1 TABLET BY MOUTH  DAILY WITH SUPPER  . SYMBICORT 160-4.5 MCG/ACT inhaler INHALE 2 PUFFS BY MOUTH  INTO THE LUNGS TWO TIMES  DAILY  . VENTOLIN HFA 108 (90 Base) MCG/ACT inhaler USE 2 PUFFS EVERY 6 HOURS AS NEEDED FOR SHORTNESS OF BREATH AND WHEEZING.   No facility-administered encounter medications on file as of 02/15/2020.    Allergies (verified) Brimonidine tartrate-timolol, Penicillins, and Breo ellipta [fluticasone furoate-vilanterol]   History: Past Medical History:  Diagnosis Date  . Anemia   . Aortic stenosis    a. Echo 09/06/12 EF 55-60%, no WMA, G2DD, Ao valve sclerosis w/ mod stenosis, LA mildly dilated, PA pressure 70mmHg  . Asthmatic bronchitis   . Complete heart block [facility])    s/p permanent pacemaker 06/27/1999 (Battery change 06/2007 and 2016).  s/p AV nodal ablation by Dr [name] 2016.  [name] COPD (chronic obstructive pulmonary disease) (HCC)    Dr. [name]  . DDD (degenerative disc disease)   . Diastolic dysfunction    a. Echo 09/06/12 EF 55-60%, no WMA, G2DD, Ao valve sclerosis w/ mod stenosis, LA mildly dilated, PA pressure 63mmHg  . Diverticulosis   . DVT (deep vein thrombosis) in pregnancy 1954   a. LLE  . Hypertension   . Hypothyroidism    on medication  . Macular degeneration    Dr. [name]    . Osteoarthrosis, unspecified whether generalized or localized, other specified sites   . PAF (paroxysmal atrial fibrillation) (HCC)    Stopped flecainide, on amiodarone but still has bouts of A FIB (mostly in mornings).   . Pure hypercholesterolemia   . Staghorn calculus    Left   Past Surgical History:  Procedure Laterality Date  . APPENDECTOMY  ~ 1941  . AV NODE ABLATION  05/10/2014  . AV NODE ABLATION N/A 05/10/2014   Procedure: AV NODE ABLATION;  Surgeon: Thompson Grayer, MD;  Location: [facility] CATH LAB;  Service: Cardiovascular;  Laterality: N/A;  . BUNIONECTOMY WITH HAMMERTOE RECONSTRUCTION Bilateral ~ 1990  . CARDIAC PACEMAKER PLACEMENT  06/27/99   Medtronic PM implanted by Dr [name]  . CARDIOVERSION N/A 12/02/2013   Procedure: CARDIOVERSION;  Surgeon: [name], MD;  Location: [place];  Service: Cardiovascular;  Laterality: N/A;  . CATARACT  EXTRACTION W/ INTRAOCULAR LENS  IMPLANT, BILATERAL Bilateral   . COLONOSCOPY WITH PROPOFOL N/A 04/22/2013   Procedure: COLONOSCOPY WITH PROPOFOL;  Surgeon: [name], MD;  Location: [facility] ENDOSCOPY;  Service: Endoscopy;  Laterality: N/A;  . DILATION AND CURETTAGE OF UTERUS  X 2   "when I was going thru menopause"  . ESOPHAGOGASTRODUODENOSCOPY (EGD) WITH PROPOFOL N/A 04/22/2013   Procedure: ESOPHAGOGASTRODUODENOSCOPY (EGD) WITH PROPOFOL;  Surgeon: [name], MD;  Location: [facility] ENDOSCOPY;  Service: Endoscopy;  Laterality: N/A;  . HERNIA REPAIR    . INCISIONAL HERNIA REPAIR    . INSERT / REPLACE / REMOVE PACEMAKER  06/2007   "took out the old; put in new"  . INSERT / REPLACE / REMOVE PACEMAKER  05/10/2014   MDT PPM generator change by Dr [name]  . JOINT REPLACEMENT    . PARTIAL NEPHRECTOMY Left 05/1974   stone disease  . PERMANENT PACEMAKER GENERATOR CHANGE N/A 05/10/2014   Procedure: PERMANENT PACEMAKER GENERATOR CHANGE;  Surgeon: Thompson Grayer, MD;  Location: [facility] CATH LAB;  Service: Cardiovascular;  Laterality: N/A;  . TONSILLECTOMY AND  ADENOIDECTOMY  1930's  . TOTAL KNEE ARTHROPLASTY Right 2001   Family History  Problem Relation Age of Onset  . Malignant hypertension Father   . Hypertension Father   . Renal Disease Father   . Breast cancer Mother   . Heart attack Brother   . Stroke Brother    Social History   Socioeconomic History  . Marital status: Widowed    Spouse name: Not on file  . Number of children: 2  . Years of education: Not on file  . Highest education level: Not on file  Occupational History  . Occupation: RETIRED    Employer: RETIRED    Comment: [employer] business  Tobacco Use  . Smoking status: Never Smoker  . Smokeless tobacco: Never Used  Vaping Use  . Vaping Use: Never used  Substance and Sexual Activity  . Alcohol use: Yes    Alcohol/week: 7.0 standard drinks    Types: 7 Glasses of wine per week    Comment: occasionally  . Drug use: No  . Sexual activity: Not Currently    Partners: Male  Other Topics Concern  . Not on file  Social History Narrative   Diet? Low salt, low fat      Do you drink/eat things with caffeine? yes      Marital status?                 married                   What year were you married? 1949      Do you live in a house, apartment, assisted living, condo, trailer, etc.? apartment      Is it one or more stories? 3 stories      How many persons live in your home? Just me      Do you have any pets in your home? (please list) no      Current or past profession: accounting      Do you exercise?          yes                            Type & how often? Walk, class, prescribed daily      Do you have a living will? yes      Do  you have a DNR form?     yes                             If not, do you want to discuss one?      Do you have signed POA/HPOA for forms? no         Social Determinants of Health   Financial Resource Strain:   . Difficulty of Paying Living Expenses: Not on file  Food Insecurity:   . Worried About Charity fundraiser in  the Last Year: Not on file  . Ran Out of Food in the Last Year: Not on file  Transportation Needs:   . Lack of Transportation (Medical): Not on file  . Lack of Transportation (Non-Medical): Not on file  Physical Activity:   . Days of Exercise per Week: Not on file  . Minutes of Exercise per Session: Not on file  Stress:   . Feeling of Stress : Not on file  Social Connections:   . Frequency of Communication with Friends and Family: Not on file  . Frequency of Social Gatherings with Friends and Family: Not on file  . Attends Religious Services: Not on file  . Active Member of Clubs or Organizations: Not on file  . Attends Archivist Meetings: Not on file  . Marital Status: Not on file    Tobacco Counseling Counseling given: Not Answered   Clinical Intake:  Pre-visit preparation completed: No  Pain : No/denies pain     BMI - recorded: 23 Nutritional Status: BMI of 19-24  Normal Nutritional Risks: Non-healing wound (left above ankle follows up with wound care Nurse) Diabetes: No     Diabetic? No          Activities of Daily Living In your present state of health, do you have any difficulty performing the following activities: 02/15/2020 10/29/2019  Hearing? - -  Vision? Y -  Comment follows up with Opthalmology -  Difficulty concentrating or making decisions? N -  Walking or climbing stairs? N -  Dressing or bathing? N -  Doing errands, shopping? N N  Preparing Food and eating ? Y -  Comment [facility] -  Using the Toilet? N -  In the past six months, have you accidently leaked urine? N -  Do you have problems with loss of bowel control? N -  Managing your Medications? Y -  Managing your Finances? N -  Housekeeping or managing your Housekeeping? Y -  Comment has Chartered certified accountant -  Some recent data might be hidden    Patient Care Team: Gayland Curry, DO as PCP - General (Geriatric Medicine) Jerline Pain, MD as PCP - Cardiology  (Cardiology) Thompson Grayer, MD as Consulting Physician (Cardiology) Deneise Lever, MD as Consulting Physician (Pulmonary Disease) Rutherford Guys, MD as Consulting Physician (Ophthalmology) [name], MD as Consulting Physician (Gastroenterology) Gaynelle Arabian, MD as Consulting Physician (Orthopedic Surgery)  Indicate any recent Medical Services you may have received from other than [facility] providers in the past year (date may be approximate).     Assessment:   This is a routine wellness examination for Jody Blake.  Hearing/Vision screen  Hearing Screening   125Hz  250Hz  500Hz  1000Hz  2000Hz  3000Hz  4000Hz  6000Hz  8000Hz   Right ear:           Left ear:           Comments: No Hearing Concerns.  Patient states that she's getting new hearing aids today 02/15/2020.  Vision Screening Comments: No Vision Concerns. Patient wears prescription glasses. Last Eye Exam August 2021.  Dietary issues and exercise activities discussed: Current Exercise Habits: Home exercise routine, Type of exercise: walking, Time (Minutes): 20, Frequency (Times/Week): 5, Weekly Exercise (Minutes/Week): 100, Intensity: Mild, Exercise limited by: None identified  Goals    . Patient Stated     I would like to stay independent       Depression Screen PHQ 2/9 Scores 02/15/2020 01/25/2020 11/10/2019 09/08/2019 02/24/2019 02/05/2019 11/25/2018  PHQ - 2 Score 0 0 0 0 0 0 0    Fall Risk Fall Risk  02/07/2020 02/02/2020 01/25/2020 01/12/2020 11/10/2019  Falls in the past year? 0 0 0 0 0  Number falls in past yr: 0 0 0 0 0  Injury with Fall? 0 0 0 0 0    Any stairs in or around the home? No  If so, are there any without handrails? No  Home free of loose throw rugs in walkways, pet beds, electrical cords, etc? No  Adequate lighting in your home to reduce risk of falls? Yes   ASSISTIVE DEVICES UTILIZED TO PREVENT FALLS:  Life alert? Yes  Use of a cane, walker or w/c? No  Grab bars in the bathroom? Yes  Shower chair or  bench in shower? Yes  Elevated toilet seat or a handicapped toilet? Yes   TIMED UP AND GO:  Was the test performed? No .  Length of time to ambulate 10 feet: N/A sec.   Gait steady and fast without use of assistive device  Cognitive Function: MMSE - Mini Mental State Exam 05/27/2017 03/26/2016  Orientation to time 5 5  Orientation to Place 5 5  Registration 3 3  Attention/ Calculation 5 5  Recall 3 3  Language- name 2 objects 2 2  Language- repeat 1 1  Language- follow 3 step command 3 3  Language- read & follow direction 1 1  Write a sentence 1 1  Copy design 0 1  Total score 29 30     6CIT Screen 02/15/2020 02/05/2019  What Year? 0 points 0 points  What month? 0 points 0 points  What time? 0 points 0 points  Count back from 20 0 points 0 points  Months in reverse 0 points 0 points  Repeat phrase 0 points 0 points  Total Score 0 0    Immunizations Immunization History  Administered Date(s) Administered  . Influenza Split 01/07/2011  . Influenza Whole 01/16/2009, 01/23/2010  . Influenza, High Dose Seasonal PF 01/06/2013, 01/15/2019  . Influenza,inj,Quad PF,6+ Mos 02/17/2015, 01/30/2018  . Influenza,inj,quad, With Preservative 01/06/2017  . Influenza-Unspecified 01/06/2014, 02/01/2016, 02/04/2020  . Moderna SARS-COVID-2 Vaccination 04/20/2019, 05/18/2019  . Pneumococcal Conjugate-13 02/24/2019  . Pneumococcal-Unspecified 11/23/2013  . Tdap 02/24/2019  . Zoster Recombinat (Shingrix) 03/13/2017, 05/20/2017    TDAP status: Up to date Flu Vaccine status: Up to date Pneumococcal vaccine status: Up to date Covid-19 vaccine status: Completed vaccines  Qualifies for Shingles Vaccine? Yes   Zostavax completed Yes   Shingrix Completed?: Yes  Screening Tests Health Maintenance  Topic Date Due  . DEXA SCAN  Never done  . TETANUS/TDAP  02/23/2029  . INFLUENZA VACCINE  Completed  . COVID-19 Vaccine  Completed  . PNA vac Low Risk Adult  Completed    Health  Maintenance  Health Maintenance Due  Topic Date Due  . DEXA SCAN  Never done  Colorectal cancer screening: No longer required.  Mammogram status: No longer required.  Bone Density status: Ordered 09/08/2019 . Pt provided with contact info and advised to call to schedule appt.  Lung Cancer Screening: (Low Dose CT Chest recommended if Age 84-80 years, 30 pack-year currently smoking OR have quit w/in 15years.) does not qualify.   Lung Cancer Screening Referral: No   Additional Screening:  Hepatitis C Screening: does not qualify; Completed yes   Vision Screening: Recommended annual ophthalmology exams for early detection of glaucoma and other disorders of the eye. Is the patient up to date with their annual eye exam?  Yes  Who is the provider or what is the name of the office in which the patient attends annual eye exams?Dr.Shoal and Dr.MCcquen  If pt is not established with a provider, would they like to be referred to a provider to establish care? No .   Dental Screening: Recommended annual dental exams for proper oral hygiene  Community Resource Referral / Chronic Care Management: CRR required this visit?  No   CCM required this visit?  No     Plan:    I have personally reviewed and noted the following in the patient's chart:   . Medical and social history . Use of alcohol, tobacco or illicit drugs  . Current medications and supplements . Functional ability and status . Nutritional status . Physical activity . Advanced directives . List of other physicians . Hospitalizations, surgeries, and ER visits in previous 12 months . Vitals . Screenings to include cognitive, depression, and falls . Referrals and appointments  In addition, I have reviewed and discussed with patient certain preventive protocols, quality metrics, and best practice recommendations. A written personalized care plan for preventive services as well as general preventive health recommendations were  provided to patient.     Sandrea Hughs, NP   02/15/2020   Nurse Notes:None

## 2020-02-15 NOTE — Telephone Encounter (Signed)
[name], Jody Blake are scheduled for a virtual visit with your provider today.    Just as we do with appointments in the office, we must obtain your consent to participate.  Your consent will be active for this visit and any virtual visit you may have with one of our providers in the next 365 days.    If you have a MyChart account, I can also send a copy of this consent to you electronically.  All virtual visits are billed to your insurance company just like a traditional visit in the office.  As this is a virtual visit, video technology does not allow for your provider to perform a traditional examination.  This may limit your provider's ability to fully assess your condition.  If your provider identifies any concerns that need to be evaluated in person or the need to arrange testing such as labs, EKG, etc, we will make arrangements to do so.    Although advances in technology are sophisticated, we cannot ensure that it will always work on either your end or our end.  If the connection with a video visit is poor, we may have to switch to a telephone visit.  With either a video or telephone visit, we are not always able to ensure that we have a secure connection.   I need to obtain your verbal consent now.   Are you willing to proceed with your visit today?   Jody Blake has provided verbal consent on 02/15/2020 for a virtual visit (video or telephone).   [place], [place] 02/15/2020  10:00 AM

## 2020-02-15 NOTE — Patient Instructions (Signed)
Jody Blake , Thank you for taking time to come for your Medicare Wellness Visit. I appreciate your ongoing commitment to your health goals. Please review the following plan we discussed and let me know if I can assist you in the future.   Screening recommendations/referrals: Colonoscopy: N/A  Mammogram : N/A  Bone Density: N/A  Recommended yearly ophthalmology/optometry visit for glaucoma screening and checkup Recommended yearly dental visit for hygiene and checkup  Vaccinations: Influenza vaccine: Up to date  Pneumococcal vaccine : Up to date  Tdap vaccine : Up to date  Shingles vaccine : Up to date   Advanced directives:Yes   Conditions/risks identified:Advance age female > 24 yrs,Hypertension,Family hx of premature CVD  Next appointment: 1 year    Preventive Care 80 Years and Older, Female Preventive care refers to lifestyle choices and visits with your health care provider that can promote health and wellness. What does preventive care include?  A yearly physical exam. This is also called an annual well check.  Dental exams once or twice a year.  Routine eye exams. Ask your health care provider how often you should have your eyes checked.  Personal lifestyle choices, including:  Daily care of your teeth and gums.  Regular physical activity.  Eating a healthy diet.  Avoiding tobacco and drug use.  Limiting alcohol use.  Practicing safe sex.  Taking low-dose aspirin every day.  Taking vitamin and mineral supplements as recommended by your health care provider. What happens during an annual well check? The services and screenings done by your health care provider during your annual well check will depend on your age, overall health, lifestyle risk factors, and family history of disease. Counseling  Your health care provider may ask you questions about your:  Alcohol use.  Tobacco use.  Drug use.  Emotional well-being.  Home and relationship  well-being.  Sexual activity.  Eating habits.  History of falls.  Memory and ability to understand (cognition).  Work and work Statistician.  Reproductive health. Screening  You may have the following tests or measurements:  Height, weight, and BMI.  Blood pressure.  Lipid and cholesterol levels. These may be checked every 5 years, or more frequently if you are over 5 years old.  Skin check.  Lung cancer screening. You may have this screening every year starting at age 38 if you have a 30-pack-year history of smoking and currently smoke or have quit within the past 15 years.  Fecal occult blood test (FOBT) of the stool. You may have this test every year starting at age 9.  Flexible sigmoidoscopy or colonoscopy. You may have a sigmoidoscopy every 5 years or a colonoscopy every 10 years starting at age 40.  Hepatitis C blood test.  Hepatitis B blood test.  Sexually transmitted disease (STD) testing.  Diabetes screening. This is done by checking your blood sugar (glucose) after you have not eaten for a while (fasting). You may have this done every 1-3 years.  Bone density scan. This is done to screen for osteoporosis. You may have this done starting at age 77.  Mammogram. This may be done every 1-2 years. Talk to your health care provider about how often you should have regular mammograms. Talk with your health care provider about your test results, treatment options, and if necessary, the need for more tests. Vaccines  Your health care provider may recommend certain vaccines, such as:  Influenza vaccine. This is recommended every year.  Tetanus, diphtheria, and acellular pertussis (Tdap, Td)  vaccine. You may need a Td booster every 10 years.  Zoster vaccine. You may need this after age 34.  Pneumococcal 13-valent conjugate (PCV13) vaccine. One dose is recommended after age 17.  Pneumococcal polysaccharide (PPSV23) vaccine. One dose is recommended after age  36. Talk to your health care provider about which screenings and vaccines you need and how often you need them. This information is not intended to replace advice given to you by your health care provider. Make sure you discuss any questions you have with your health care provider. Document Released: 04/21/2015 Document Revised: 12/13/2015 Document Reviewed: 01/24/2015 Elsevier Interactive Patient Education  2017 [place] Prevention in the Home Falls can cause injuries. They can happen to people of all ages. There are many things you can do to make your home safe and to help prevent falls. What can I do on the outside of my home?  Regularly fix the edges of walkways and driveways and fix any cracks.  Remove anything that might make you trip as you walk through a door, such as a raised step or threshold.  Trim any bushes or trees on the path to your home.  Use bright outdoor lighting.  Clear any walking paths of anything that might make someone trip, such as rocks or tools.  Regularly check to see if handrails are loose or broken. Make sure that both sides of any steps have handrails.  Any raised decks and porches should have guardrails on the edges.  Have any leaves, snow, or ice cleared regularly.  Use sand or salt on walking paths during winter.  Clean up any spills in your garage right away. This includes oil or grease spills. What can I do in the bathroom?  Use night lights.  Install grab bars by the toilet and in the tub and shower. Do not use towel bars as grab bars.  Use non-skid mats or decals in the tub or shower.  If you need to sit down in the shower, use a plastic, non-slip stool.  Keep the floor dry. Clean up any water that spills on the floor as soon as it happens.  Remove soap buildup in the tub or shower regularly.  Attach bath mats securely with double-sided non-slip rug tape.  Do not have throw rugs and other things on the floor that can make  you trip. What can I do in the bedroom?  Use night lights.  Make sure that you have a light by your bed that is easy to reach.  Do not use any sheets or blankets that are too big for your bed. They should not hang down onto the floor.  Have a firm chair that has side arms. You can use this for support while you get dressed.  Do not have throw rugs and other things on the floor that can make you trip. What can I do in the kitchen?  Clean up any spills right away.  Avoid walking on wet floors.  Keep items that you use a lot in easy-to-reach places.  If you need to reach something above you, use a strong step stool that has a grab bar.  Keep electrical cords out of the way.  Do not use floor polish or wax that makes floors slippery. If you must use wax, use non-skid floor wax.  Do not have throw rugs and other things on the floor that can make you trip. What can I do with my stairs?  Do not leave  any items on the stairs.  Make sure that there are handrails on both sides of the stairs and use them. Fix handrails that are broken or loose. Make sure that handrails are as long as the stairways.  Check any carpeting to make sure that it is firmly attached to the stairs. Fix any carpet that is loose or worn.  Avoid having throw rugs at the top or bottom of the stairs. If you do have throw rugs, attach them to the floor with carpet tape.  Make sure that you have a light switch at the top of the stairs and the bottom of the stairs. If you do not have them, ask someone to add them for you. What else can I do to help prevent falls?  Wear shoes that:  Do not have high heels.  Have rubber bottoms.  Are comfortable and fit you well.  Are closed at the toe. Do not wear sandals.  If you use a stepladder:  Make sure that it is fully opened. Do not climb a closed stepladder.  Make sure that both sides of the stepladder are locked into place.  Ask someone to hold it for you, if  possible.  Clearly mark and make sure that you can see:  Any grab bars or handrails.  First and last steps.  Where the edge of each step is.  Use tools that help you move around (mobility aids) if they are needed. These include:  Canes.  Walkers.  Scooters.  Crutches.  Turn on the lights when you go into a dark area. Replace any light bulbs as soon as they burn out.  Set up your furniture so you have a clear path. Avoid moving your furniture around.  If any of your floors are uneven, fix them.  If there are any pets around you, be aware of where they are.  Review your medicines with your doctor. Some medicines can make you feel dizzy. This can increase your chance of falling. Ask your doctor what other things that you can do to help prevent falls. This information is not intended to replace advice given to you by your health care provider. Make sure you discuss any questions you have with your health care provider. Document Released: 01/19/2009 Document Revised: 08/31/2015 Document Reviewed: 04/29/2014 Elsevier Interactive Patient Education  2017 [employer].

## 2020-02-23 ENCOUNTER — Other Ambulatory Visit: Payer: Self-pay

## 2020-02-23 ENCOUNTER — Encounter: Payer: Self-pay | Admitting: Cardiology

## 2020-02-23 ENCOUNTER — Ambulatory Visit (INDEPENDENT_AMBULATORY_CARE_PROVIDER_SITE_OTHER): Payer: Medicare Other | Admitting: Cardiology

## 2020-02-23 VITALS — BP 120/60 | HR 87 | Ht 64.0 in | Wt 135.0 lb

## 2020-02-23 DIAGNOSIS — I4821 Permanent atrial fibrillation: Secondary | ICD-10-CM

## 2020-02-23 DIAGNOSIS — I5032 Chronic diastolic (congestive) heart failure: Secondary | ICD-10-CM

## 2020-02-23 DIAGNOSIS — Z950 Presence of cardiac pacemaker: Secondary | ICD-10-CM

## 2020-02-23 DIAGNOSIS — I350 Nonrheumatic aortic (valve) stenosis: Secondary | ICD-10-CM

## 2020-02-23 NOTE — Progress Notes (Signed)
Cardiology Office Note:    Date:  02/23/2020   ID:  Jody Blake, DOB 12-Jun-1925, MRN [id number]  PCP:  Gayland Curry, DO  [facility] Cardiologist:  Candee Furbish, MD  [facility] Electrophysiologist:  None   Referring MD: Gayland Curry, DO     History of Present Illness:    Jody Blake is a 86 y.o. female here for the follow-up of atrial fibrillation status post AV nodal ablation and pacemaker placement with chronic diastolic heart failure.  Prior EP notes from Dr. [name], [facility] Standard, [place] reviewed.  She had experienced recent pneumonia shortness of breath felt to be multifactorial with COPD chronic diastolic heart failure fairly compensated.  Has moderate aortic stenosis as well.  Lives at [facility].  MDT dual chamber PPM implanted 2009, gen change  05/10/2014 Programmed VVIR S/p AV node ablation  Had infection in leg open sore.  Continues to heal.  Overall doing fairly well without any chest pain.  She has baseline mild shortness of breath.  Mild wheezing heard on exam.  Aortic murmur heard on exam as well.  Past Medical History:  Diagnosis Date   Anemia    Aortic stenosis    a. Echo 09/06/12 EF 55-60%, no WMA, G2DD, Ao valve sclerosis w/ mod stenosis, LA mildly dilated, PA pressure 64mmHg   Asthmatic bronchitis    Complete heart block (HCC)    s/p permanent pacemaker 06/27/1999 (Battery change 06/2007 and 2016).  s/p AV nodal ablation by Dr [name] 2016.   COPD (chronic obstructive pulmonary disease) (HCC)    Dr. [name]   DDD (degenerative disc disease)    Diastolic dysfunction    a. Echo 09/06/12 EF 55-60%, no WMA, G2DD, Ao valve sclerosis w/ mod stenosis, LA mildly dilated, PA pressure 7mmHg   Diverticulosis    DVT (deep vein thrombosis) in pregnancy 1954   a. LLE   Hypertension    Hypothyroidism    on medication   Macular degeneration    Dr. [name]   Osteoarthrosis, unspecified whether generalized or localized, other specified  sites    PAF (paroxysmal atrial fibrillation) ([place])    Stopped flecainide, on amiodarone but still has bouts of A FIB (mostly in mornings).    Pure hypercholesterolemia    Staghorn calculus    Left    Past Surgical History:  Procedure Laterality Date   APPENDECTOMY  ~ 1941   AV NODE ABLATION  05/10/2014   AV NODE ABLATION N/A 05/10/2014   Procedure: AV NODE ABLATION;  Surgeon: Thompson Grayer, MD;  Location: [facility] CATH LAB;  Service: Cardiovascular;  Laterality: N/A;   BUNIONECTOMY WITH HAMMERTOE RECONSTRUCTION Bilateral ~ [place]  06/27/99   Medtronic PM implanted by Dr [name]   CARDIOVERSION N/A 12/02/2013   Procedure: CARDIOVERSION;  Surgeon: [name], MD;  Location: [place];  Service: Cardiovascular;  Laterality: N/A;   CATARACT EXTRACTION W/ INTRAOCULAR LENS  IMPLANT, BILATERAL Bilateral    COLONOSCOPY WITH PROPOFOL N/A 04/22/2013   Procedure: COLONOSCOPY WITH PROPOFOL;  Surgeon: [name], MD;  Location: [facility] ENDOSCOPY;  Service: Endoscopy;  Laterality: N/A;   DILATION AND CURETTAGE OF UTERUS  X 2   "when I was going thru menopause"   ESOPHAGOGASTRODUODENOSCOPY (EGD) WITH PROPOFOL N/A 04/22/2013   Procedure: ESOPHAGOGASTRODUODENOSCOPY (EGD) WITH PROPOFOL;  Surgeon: [name], MD;  Location: [facility] ENDOSCOPY;  Service: Endoscopy;  Laterality: N/A;   HERNIA REPAIR     INCISIONAL HERNIA REPAIR  INSERT / REPLACE / REMOVE PACEMAKER  06/2007   "took out the old; put in new"   INSERT / REPLACE / REMOVE PACEMAKER  05/10/2014   MDT PPM generator change by Dr [name]   JOINT REPLACEMENT     PARTIAL NEPHRECTOMY Left 05/1974   stone disease   PERMANENT PACEMAKER GENERATOR CHANGE N/A 05/10/2014   Procedure: PERMANENT PACEMAKER GENERATOR CHANGE;  Surgeon: Thompson Grayer, MD;  Location: [facility] CATH LAB;  Service: Cardiovascular;  Laterality: N/A;   TONSILLECTOMY AND ADENOIDECTOMY  1930's   TOTAL KNEE ARTHROPLASTY Right 2001    Current  Medications: Current Meds  Medication Sig   amLODipine (NORVASC) 5 MG tablet TAKE 1 TABLET BY MOUTH  DAILY   atorvastatin (LIPITOR) 40 MG tablet TAKE 1 TABLET BY MOUTH ONCE DAILY FOR CHOLESTEROL   azelastine (ASTELIN) 0.1 % nasal spray USE 1 TO 2 SPRAYS INTO BOTH NOSTRILS TWICE DAILY.   Cholecalciferol (VITAMIN D) 2000 units CAPS Take 1 capsule by mouth daily.   fluticasone (FLONASE) 50 MCG/ACT nasal spray USE 2 SPRAYS EACH NOSTRIL ONCE A DAY AS NEEDED FOR ALLERGIES OR CONGESTION.   furosemide (LASIX) 40 MG tablet Take 1.5 tablets (60 mg total) by mouth daily. Ok to use an extra dose of 40 mg for weight gain   gabapentin (NEURONTIN) 100 MG capsule TAKE 1 CAPSULE BY MOUTH AT  BEDTIME   latanoprost (XALATAN) 0.005 % ophthalmic solution Place 1 drop into both eyes at bedtime.   levothyroxine (SYNTHROID) 88 MCG tablet TAKE 1 TABLET BY MOUTH  DAILY BEFORE BREAKFAST   losartan (COZAAR) 100 MG tablet Take 100 mg by mouth 2 (two) times daily.   LUTEIN PO Take 25 mg by mouth daily.    Magnesium 250 MG TABS Take 1 tablet by mouth daily.   melatonin 3 MG TABS tablet Take 1 tablet (3 mg total) by mouth at bedtime.   metoprolol succinate (TOPROL-XL) 100 MG 24 hr tablet Take 100 mg by mouth daily.   montelukast (SINGULAIR) 10 MG tablet TAKE 1 TABLET BY MOUTH  DAILY   Multiple Vitamins-Minerals (PRESERVISION AREDS 2) CAPS Take 1 capsule by mouth in the morning and at bedtime.    potassium chloride (KLOR-CON) 10 MEQ tablet Take 10 mEq by mouth daily.   potassium chloride SA (KLOR-CON) 20 MEQ tablet Take 20 mEq by mouth daily.   predniSONE (DELTASONE) 5 MG tablet Take 1 tablet (5 mg total) by mouth daily with breakfast.   Rivaroxaban (XARELTO) 15 MG TABS tablet TAKE 1 TABLET BY MOUTH  DAILY WITH SUPPER   SYMBICORT 160-4.5 MCG/ACT inhaler INHALE 2 PUFFS BY MOUTH  INTO THE LUNGS TWO TIMES  DAILY   VENTOLIN HFA 108 (90 Base) MCG/ACT inhaler USE 2 PUFFS EVERY 6 HOURS AS NEEDED FOR SHORTNESS  OF BREATH AND WHEEZING.     Allergies:   Brimonidine tartrate-timolol, Penicillins, and Breo ellipta [fluticasone furoate-vilanterol]   Social History   Socioeconomic History   Marital status: Widowed    Spouse name: Not on file   Number of children: 2   Years of education: Not on file   Highest education level: Not on file  Occupational History   Occupation: RETIRED    Employer: RETIRED    Comment: Family [employer] business  Tobacco Use   Smoking status: Never Smoker   Smokeless tobacco: Never Used  Scientific laboratory technician Use: Never used  Substance and Sexual Activity   Alcohol use: Yes    Alcohol/week: 7.0 standard drinks  Types: 7 Glasses of wine per week    Comment: occasionally   Drug use: No   Sexual activity: Not Currently    Partners: Male  Other Topics Concern   Not on file  Social History Narrative   Diet? Low salt, low fat      Do you drink/eat things with caffeine? yes      Marital status?                 married                   What year were you married? 1949      Do you live in a house, apartment, assisted living, condo, trailer, etc.? apartment      Is it one or more stories? 3 stories      How many persons live in your home? Just me      Do you have any pets in your home? (please list) no      Current or past profession: accounting      Do you exercise?          yes                            Type & how often? Walk, class, prescribed daily      Do you have a living will? yes      Do you have a DNR form?     yes                             If not, do you want to discuss one?      Do you have signed POA/HPOA for forms? no         Social Determinants of Health   Financial Resource Strain:    Difficulty of Paying Living Expenses: Not on file  Food Insecurity:    Worried About [place] in the Last Year: Not on file   Ran Out of Food in the Last Year: Not on file  Transportation Needs:    Lack of Transportation  (Medical): Not on file   Lack of Transportation (Non-Medical): Not on file  Physical Activity:    Days of Exercise per Week: Not on file   Minutes of Exercise per Session: Not on file  Stress:    Feeling of Stress : Not on file  Social Connections:    Frequency of Communication with Friends and Family: Not on file   Frequency of Social Gatherings with Friends and Family: Not on file   Attends Religious Services: Not on file   Active Member of Clubs or Organizations: Not on file   Attends Archivist Meetings: Not on file   Marital Status: Not on file     Family History: The patient's family history includes Breast cancer in her mother; Heart attack in her brother; Hypertension in her father; Malignant hypertension in her father; Renal Disease in her father; Stroke in her brother.  ROS:   Please see the history of present illness.     All other systems reviewed and are negative.  EKGs/Labs/Other Studies Reviewed:    The following studies were reviewed today:  01/15/2019: TTE IMPRESSIONS  1. Left ventricular ejection fraction, by visual estimation, is >75%. The  left ventricle has hyperdynamic function. There is moderately increased  left ventricular hypertrophy.  2. Abnormal  septal motion consistent with RV pacemaker.  3. Left ventricular diastolic Doppler parameters are indeterminate  pattern of LV diastolic filling.  4. Global right ventricle has mildly reduced systolic function.The right  ventricular size is normal. No increase in right ventricular wall  thickness.  5. Left atrial size was severely dilated.  6. Right atrial size was severely dilated.  7. Small pericardial effusion.  8. Moderate aortic valve annular calcification.  9. Moderate mitral annular calcification.  10. The mitral valve is normal in structure. Trace mitral valve  regurgitation.  11. The tricuspid valve is normal in structure. Tricuspid valve  regurgitation moderate.    12. The aortic valve is tricuspid Aortic valve regurgitation is trivial by  color flow Doppler. Moderate aortic valve stenosis.  13. There is Moderate calcification of the aortic valve.  14. There is Moderate thickening of the aortic valve.  15. Calcified leaflets with limited leaflet excursion. Visually appears to  minimally open.  16. The pulmonic valve was normal in structure. Pulmonic valve  regurgitation is mild by color flow Doppler.  17. The aortic root was not well visualized.  18. Moderately elevated pulmonary artery systolic pressure.  19. A pacer wire is visualized in the RA and RV.  20. The inferior vena cava is normal in size with greater than 50%  respiratory variability, suggesting right atrial pressure of 3 mmHg.    12/24/2018: Lexiscan stress myoview  Nuclear stress EF: 58%.  No T wave inversion was noted during stress.  There was no ST segment deviation noted during stress.  This is a low risk study.  No reversible ischemia. LVEF 58% with normal wall motion. This is a low risk study.    Recent Labs: 10/27/2019: ALT 26; Magnesium 2.5 10/28/2019: BUN 19; Creatinine, Ser 0.96; Hemoglobin 13.5; Platelets 225; Potassium 3.6; Sodium 139  Recent Lipid Panel    Component Value Date/Time   CHOL 138 11/04/2017 0600   TRIG 53 11/04/2017 0600   HDL 70 11/04/2017 0600   CHOLHDL 3.1 06/25/2017 0229   VLDL 11 06/25/2017 0229   LDLCALC 58 11/04/2017 0600     Risk Assessment/Calculations:       Physical Exam:    VS:  BP 120/60    Pulse 87    Ht 5\' 4"  (1.626 m)    Wt 135 lb (61.2 kg)    LMP 04/08/1974    SpO2 96%    BMI 23.17 kg/m     Wt Readings from Last 3 Encounters:  02/23/20 135 lb (61.2 kg)  02/07/20 134 lb (60.8 kg)  02/02/20 134 lb 6.4 oz (61 kg)     GEN:  Well nourished, well developed in no acute distress HEENT: Normal NECK: No JVD; No carotid bruits LYMPHATICS: No lymphadenopathy CARDIAC: RRR, aortic murmur, no rubs, gallops RESPIRATORY:   Clear to auscultation without rales, wheezing or rhonchi  ABDOMEN: Soft, non-tender, non-distended MUSCULOSKELETAL:  No edema; No deformity  SKIN: Warm and dry, mild edema NEUROLOGIC:  Alert and oriented x 3 PSYCHIATRIC:  Normal affect   ASSESSMENT:    1. Permanent atrial fibrillation (HCC)   2. Cardiac pacemaker in situ   3. Chronic diastolic congestive heart failure ([place])   4. Aortic valve stenosis, etiology of cardiac valve disease unspecified    PLAN:    In order of problems listed above:  Permanent atrial fibrillation/AV nodal ablation -Pacemaker placed, Dr. [name] normal function. -Pacemaker dependent  Chronic anticoagulation -On Xarelto.  Dose adjusted.  CHADSVASc 5, prior TIA x2  Moderate aortic stenosis -Stable on echocardiogram as above.  Continue to monitor. -Repeat echocardiogram.  Chronic diastolic heart failure -Continue to maintain weight.  Watch for edema.      Shared Decision Making/Informed Consent        Medication Adjustments/Labs and Tests Ordered: Current medicines are reviewed at length with the patient today.  Concerns regarding medicines are outlined above.  No orders of the defined types were placed in this encounter.  No orders of the defined types were placed in this encounter.   Patient Instructions  Medication Instructions:   Your physician recommends that you continue on your current medications as directed. Please refer to the Current Medication list given to you today.  *If you need a refill on your cardiac medications before your next appointment, please call your pharmacy*   Follow-Up: At [facility], you and your health needs are our priority.  As part of our continuing mission to provide you with exceptional heart care, we have created designated Provider Care Teams.  These Care Teams include your primary Cardiologist (physician) and Advanced Practice Providers (APPs -  Physician Assistants and Nurse Practitioners) who  all work together to provide you with the care you need, when you need it.  We recommend signing up for the patient portal called "MyChart".  Sign up information is provided on this After Visit Summary.  MyChart is used to connect with patients for Virtual Visits (Telemedicine).  Patients are able to view lab/test results, encounter notes, upcoming appointments, etc.  Non-urgent messages can be sent to your provider as well.   To learn more about what you can do with MyChart, go to [email or web address].    Your next appointment:   6 month(s)  The format for your next appointment:   In Person  Provider:   Candee Furbish, MD       Signed, Candee Furbish, MD  02/23/2020 1:58 PM    [place]

## 2020-02-23 NOTE — Patient Instructions (Signed)
Medication Instructions:   Your physician recommends that you continue on your current medications as directed. Please refer to the Current Medication list given to you today.  *If you need a refill on your cardiac medications before your next appointment, please call your pharmacy*   Follow-Up: At [facility], you and your health needs are our priority.  As part of our continuing mission to provide you with exceptional heart care, we have created designated Provider Care Teams.  These Care Teams include your primary Cardiologist (physician) and Advanced Practice Providers (APPs -  Physician Assistants and Nurse Practitioners) who all work together to provide you with the care you need, when you need it.  We recommend signing up for the patient portal called "MyChart".  Sign up information is provided on this After Visit Summary.  MyChart is used to connect with patients for Virtual Visits (Telemedicine).  Patients are able to view lab/test results, encounter notes, upcoming appointments, etc.  Non-urgent messages can be sent to your provider as well.   To learn more about what you can do with MyChart, go to [email or web address].    Your next appointment:   6 month(s)  The format for your next appointment:   In Person  Provider:   Mark Skains, MD    

## 2020-03-01 ENCOUNTER — Ambulatory Visit: Payer: Medicare Other | Admitting: Internal Medicine

## 2020-03-09 ENCOUNTER — Ambulatory Visit (INDEPENDENT_AMBULATORY_CARE_PROVIDER_SITE_OTHER): Payer: Medicare Other

## 2020-03-09 DIAGNOSIS — I495 Sick sinus syndrome: Secondary | ICD-10-CM | POA: Diagnosis not present

## 2020-03-13 LAB — CUP PACEART REMOTE DEVICE CHECK
Battery Impedance: 375 Ohm
Battery Remaining Longevity: 105 mo
Battery Voltage: 2.79 V
Brady Statistic RV Percent Paced: 99 %
Date Time Interrogation Session: 20211203133124
Implantable Lead Implant Date: 20090318
Implantable Lead Implant Date: 20090318
Implantable Lead Location: 753859
Implantable Lead Location: 753860
Implantable Lead Model: 5076
Implantable Lead Model: 5092
Implantable Pulse Generator Implant Date: 20160202
Lead Channel Impedance Value: 67 Ohm
Lead Channel Impedance Value: 690 Ohm
Lead Channel Pacing Threshold Amplitude: 1.125 V
Lead Channel Pacing Threshold Pulse Width: 0.4 ms
Lead Channel Setting Pacing Amplitude: 2.5 V
Lead Channel Setting Pacing Pulse Width: 0.4 ms
Lead Channel Setting Sensing Sensitivity: 4 mV

## 2020-03-21 NOTE — Progress Notes (Signed)
Remote pacemaker transmission.   

## 2020-04-12 ENCOUNTER — Telehealth: Payer: Self-pay | Admitting: Internal Medicine

## 2020-04-12 ENCOUNTER — Non-Acute Institutional Stay (SKILLED_NURSING_FACILITY): Payer: Medicare Other | Admitting: Internal Medicine

## 2020-04-12 DIAGNOSIS — I872 Venous insufficiency (chronic) (peripheral): Secondary | ICD-10-CM | POA: Diagnosis not present

## 2020-04-12 DIAGNOSIS — J209 Acute bronchitis, unspecified: Secondary | ICD-10-CM

## 2020-04-12 DIAGNOSIS — I83019 Varicose veins of right lower extremity with ulcer of unspecified site: Secondary | ICD-10-CM

## 2020-04-12 DIAGNOSIS — J440 Chronic obstructive pulmonary disease with acute lower respiratory infection: Secondary | ICD-10-CM

## 2020-04-12 DIAGNOSIS — L97919 Non-pressure chronic ulcer of unspecified part of right lower leg with unspecified severity: Secondary | ICD-10-CM | POA: Diagnosis not present

## 2020-04-12 DIAGNOSIS — I4821 Permanent atrial fibrillation: Secondary | ICD-10-CM

## 2020-04-12 DIAGNOSIS — J441 Chronic obstructive pulmonary disease with (acute) exacerbation: Secondary | ICD-10-CM | POA: Diagnosis not present

## 2020-04-12 DIAGNOSIS — I5032 Chronic diastolic (congestive) heart failure: Secondary | ICD-10-CM

## 2020-04-12 MED ORDER — DOXYCYCLINE HYCLATE 100 MG PO TABS: 100.0000 mg | ORAL_TABLET | Freq: Two times a day (BID) | ORAL | 0 refills | Status: DC

## 2020-04-12 MED ORDER — PREDNISONE 20 MG PO TABS: 20.0000 mg | ORAL_TABLET | Freq: Every day | ORAL | 0 refills | Status: DC

## 2020-04-12 MED ORDER — AZITHROMYCIN 250 MG PO TABS: ORAL_TABLET | ORAL | 0 refills | Status: DC

## 2020-04-12 NOTE — Telephone Encounter (Signed)
Any cold or flu-like illness right now should be suspected Omicron Covid infection, even if vaccinated. She should be tested. Ok to order outpatient CXR  dx Acute bronchitis  Suggest we offer Zpak 250 mg, # 6, 2 today then one daily                             Prednisone 20 mg, # 5, 1 daily x 5 days  Thanks

## 2020-04-12 NOTE — Progress Notes (Signed)
Provider:  Gwenith Spitziffany L. Renato Gailseed, D.O., C.M.D. [facility]   Place of Service:  SNF (31)  PCP: Kermit Baloeed, Darcee Dekker L, DO Patient Care Team: Kermit Baloeed, Alec Mcphee L, DO as PCP - General (Geriatric Medicine) Jake BatheSkains, Mark C, MD as PCP - Cardiology (Cardiology) Hillis RangeAllred, James, MD as Consulting Physician (Cardiology) Waymon BudgeYoung, Clinton D, MD as Consulting Physician (Pulmonary Disease) Jethro BolusShapiro, Mark, MD as Consulting Physician (Ophthalmology) Charolett BumpersJohnson, Martin K, MD as Consulting Physician (Gastroenterology) Ollen GrossAluisio, Frank, MD as Consulting Physician (Orthopedic Surgery)  Extended Emergency Contact Information Primary Emergency Contact: Abram SanderSnider,John K  [place] Home Phone: [phone] Mobile Phone: [phone] Relation: Son Secondary Emergency Contact: Hunt,Cheryl  [place] Mobile Phone: [phone] Relation: Daughter  Code Status: DNR Goals of Care: Advanced Directive information Advanced Directives 02/15/2020  Does Patient Have a Medical Advance Directive? Yes  Type of Estate agentAdvance Directive Healthcare Power of [place];Living will;Out of facility DNR (pink MOST or yellow form)  Does patient want to make changes to medical advance directive? No - Patient declined  Copy of Healthcare Power of Attorney in Chart? Yes - validated most recent copy scanned in chart (See row information)  Would patient like information on creating a medical advance directive? -  Pre-existing out of facility DNR order (yellow form or pink MOST form) -      Chief Complaint  Patient presents with  . New Admit To SNF    Congestion, shortness of breath, rapid covid negative    HPI: Patient is a 87 y.o. female with COPD, CHF seen today for admission to [facility] due to respirations of "30" at home, congestion and shortness of breath.  cov[place] in [place].  CXR pending.    Notably, she's not been able to get her compression stockings on the past few days and  legs are considerably more swollen than her last visit with 2+ pitting edema and leakage of fluid on right posterior leg so dressing has been placed and to have daily changes.  Not done today yet.  Past Medical History:  Diagnosis Date  . Anemia   . Aortic stenosis    a. Echo 09/06/12 EF 55-60%, no WMA, G2DD, Ao valve sclerosis w/ mod stenosis, LA mildly dilated, PA pressure 41mmHg  . Asthmatic bronchitis   . Complete heart block [facility](HCC)    s/p permanent pacemaker 06/27/1999 (Battery change 06/2007 and 2016).  s/p AV nodal ablation by Dr Johney FrameAllred 2016.  [name]. COPD (chronic obstructive pulmonary disease) (HCC)    Dr. Maple HudsonYoung  . DDD (degenerative disc disease)   . Diastolic dysfunction    a. Echo 09/06/12 EF 55-60%, no WMA, G2DD, Ao valve sclerosis w/ mod stenosis, LA mildly dilated, PA pressure 41mmHg  . Diverticulosis   . DVT (deep vein thrombosis) in pregnancy 1954   a. LLE  . Hypertension   . Hypothyroidism    on medication  . Macular degeneration    Dr. Jesusita OkaKuri  . Osteoarthrosis, unspecified whether generalized or localized, other specified sites   . PAF (paroxysmal atrial fibrillation) (HCC)    Stopped flecainide, on amiodarone but still has bouts of A FIB (mostly in mornings).   . Pure hypercholesterolemia   . Staghorn calculus    Left   Past Surgical History:  Procedure Laterality Date  . APPENDECTOMY  ~ 1941  . AV NODE ABLATION  05/10/2014  . AV NODE ABLATION N/A 05/10/2014   Procedure: AV NODE ABLATION;  Surgeon: Hillis RangeJames Allred, MD;  [facility];  Service: Cardiovascular;  Laterality: N/A;  . BUNIONECTOMY WITH HAMMERTOE RECONSTRUCTION Bilateral ~ 1990  . CARDIAC PACEMAKER PLACEMENT  06/27/99   Medtronic PM implanted by Dr [name]  . CARDIOVERSION N/A 12/02/2013   Procedure: CARDIOVERSION;  Surgeon: [name], MD;  Location: [facility] ENDOSCOPY;  Service: Cardiovascular;  Laterality: N/A;  . CATARACT EXTRACTION W/ INTRAOCULAR LENS  IMPLANT, BILATERAL Bilateral   . COLONOSCOPY WITH  PROPOFOL N/A 04/22/2013   Procedure: COLONOSCOPY WITH PROPOFOL;  Surgeon: [name], MD;  Location: [facility] ENDOSCOPY;  Service: Endoscopy;  Laterality: N/A;  . DILATION AND CURETTAGE OF UTERUS  X 2   "when I was going thru menopause"  . ESOPHAGOGASTRODUODENOSCOPY (EGD) WITH PROPOFOL N/A 04/22/2013   Procedure: ESOPHAGOGASTRODUODENOSCOPY (EGD) WITH PROPOFOL;  Surgeon: [name], MD;  Location: [facility] ENDOSCOPY;  Service: Endoscopy;  Laterality: N/A;  . HERNIA REPAIR    . INCISIONAL HERNIA REPAIR    . INSERT / REPLACE / REMOVE PACEMAKER  06/2007   "took out the old; put in new"  . INSERT / REPLACE / REMOVE PACEMAKER  05/10/2014   MDT PPM generator change by Dr [name]  . JOINT REPLACEMENT    . PARTIAL NEPHRECTOMY Left 05/1974   stone disease  . PERMANENT PACEMAKER GENERATOR CHANGE N/A 05/10/2014   Procedure: PERMANENT PACEMAKER GENERATOR CHANGE;  Surgeon: Thompson Grayer, MD;  Location: [facility] CATH LAB;  Service: Cardiovascular;  Laterality: N/A;  . TONSILLECTOMY AND ADENOIDECTOMY  1930's  . TOTAL KNEE ARTHROPLASTY Right 2001    Social History   Socioeconomic History  . Marital status: Widowed    Spouse name: Not on file  . Number of children: 2  . Years of education: Not on file  . Highest education level: Not on file  Occupational History  . Occupation: RETIRED    Employer: RETIRED    Comment: Family [employer] business  Tobacco Use  . Smoking status: Never Smoker  . Smokeless tobacco: Never Used  Vaping Use  . Vaping Use: Never used  Substance and Sexual Activity  . Alcohol use: Yes    Alcohol/week: 7.0 standard drinks    Types: 7 Glasses of wine per week    Comment: occasionally  . Drug use: No  . Sexual activity: Not Currently    Partners: Male  Other Topics Concern  . Not on file  Social History Narrative   Diet? Low salt, low fat      Do you drink/eat things with caffeine? yes      Marital status?                 married                   What year were you married? 1949       Do you live in a house, apartment, assisted living, condo, trailer, etc.? apartment      Is it one or more stories? 3 stories      How many persons live in your home? Just me      Do you have any pets in your home? (please list) no      Current or past profession: accounting      Do you exercise?          yes                            Type & how often? Walk, class, prescribed daily  Do you have a living will? yes      Do you have a DNR form?     yes                             If not, do you want to discuss one?      Do you have signed POA/HPOA for forms? no         Social Determinants of Health   Financial Resource Strain: Not on file  Food Insecurity: Not on file  Transportation Needs: Not on file  Physical Activity: Not on file  Stress: Not on file  Social Connections: Not on file    reports that she has never smoked. She has never used smokeless tobacco. She reports current alcohol use of about 7.0 standard drinks of alcohol per week. She reports that she does not use drugs.  Functional Status Survey:    Family History  Problem Relation Age of Onset  . Malignant hypertension Father   . Hypertension Father   . Renal Disease Father   . Breast cancer Mother   . Heart attack Brother   . Stroke Brother     Health Maintenance  Topic Date Due  . DEXA SCAN  Never done  . TETANUS/TDAP  02/23/2029  . INFLUENZA VACCINE  Completed  . COVID-19 Vaccine  Completed  . PNA vac Low Risk Adult  Completed    Allergies  Allergen Reactions  . Brimonidine Tartrate-Timolol Other (See Comments)    REACTION: systemic malaise amigen eye drop  . Penicillins Hives  . Breo Ellipta [Fluticasone Furoate-Vilanterol] Other (See Comments)    Ran blood pressure up Increased blood pressure    Outpatient Encounter Medications as of 04/12/2020  Medication Sig  . amLODipine (NORVASC) 5 MG tablet TAKE 1 TABLET BY MOUTH  DAILY  . atorvastatin (LIPITOR) 40 MG tablet TAKE 1 TABLET BY  MOUTH ONCE DAILY FOR CHOLESTEROL  . azelastine (ASTELIN) 0.1 % nasal spray USE 1 TO 2 SPRAYS INTO BOTH NOSTRILS TWICE DAILY.  [name] Cholecalciferol (VITAMIN D) 2000 units CAPS Take 1 capsule by mouth daily.  [name] doxycycline (VIBRA-TABS) 100 MG tablet Take 1 tablet (100 mg total) by mouth 2 (two) times daily.  . fluticasone (FLONASE) 50 MCG/ACT nasal spray USE 2 SPRAYS EACH NOSTRIL ONCE A DAY AS NEEDED FOR ALLERGIES OR CONGESTION.  . furosemide (LASIX) 40 MG tablet Take 1.5 tablets (60 mg total) by mouth daily. Ok to use an extra dose of 40 mg for weight gain  . gabapentin (NEURONTIN) 100 MG capsule TAKE 1 CAPSULE BY MOUTH AT  BEDTIME  . latanoprost (XALATAN) 0.005 % ophthalmic solution Place 1 drop into both eyes at bedtime.  [name] levothyroxine (SYNTHROID) 88 MCG tablet TAKE 1 TABLET BY MOUTH  DAILY BEFORE BREAKFAST  . losartan (COZAAR) 100 MG tablet Take 100 mg by mouth 2 (two) times daily.  . LUTEIN PO Take 25 mg by mouth daily.   . Magnesium 250 MG TABS Take 1 tablet by mouth daily.  . melatonin 3 MG TABS tablet Take 1 tablet (3 mg total) by mouth at bedtime.  . metoprolol succinate (TOPROL-XL) 100 MG 24 hr tablet Take 100 mg by mouth daily.  . montelukast (SINGULAIR) 10 MG tablet TAKE 1 TABLET BY MOUTH  DAILY  . Multiple Vitamins-Minerals (PRESERVISION AREDS 2) CAPS Take 1 capsule by mouth in the morning and at bedtime.   . potassium chloride SA (KLOR-CON) 20 MEQ  tablet Take 20 mEq by mouth daily.  . predniSONE (DELTASONE) 20 MG tablet Take 1 tablet (20 mg total) by mouth daily with breakfast.  . predniSONE (DELTASONE) 5 MG tablet Take 1 tablet (5 mg total) by mouth daily with breakfast.  . Rivaroxaban (XARELTO) 15 MG TABS tablet TAKE 1 TABLET BY MOUTH  DAILY WITH SUPPER  . SYMBICORT 160-4.5 MCG/ACT inhaler INHALE 2 PUFFS BY MOUTH  INTO THE LUNGS TWO TIMES  DAILY  . VENTOLIN HFA 108 (90 Base) MCG/ACT inhaler USE 2 PUFFS EVERY 6 HOURS AS NEEDED FOR SHORTNESS OF BREATH AND WHEEZING.  . [DISCONTINUED]  potassium chloride (KLOR-CON) 10 MEQ tablet Take 10 mEq by mouth daily.   No facility-administered encounter medications on file as of 04/12/2020.    Review of Systems  Constitutional: Positive for malaise/fatigue. Negative for chills and fever.  HENT: Positive for congestion and hearing loss. Negative for sore throat.   Eyes: Positive for blurred vision.  Respiratory: Positive for cough, sputum production, shortness of breath and wheezing.   Cardiovascular: Positive for leg swelling. Negative for chest pain and palpitations.  Gastrointestinal: Negative for abdominal pain and constipation.  Genitourinary: Negative for dysuria.  Musculoskeletal: Negative for falls and joint pain.  Skin: Negative for itching and rash.  Neurological: Negative for dizziness and loss of consciousness.  Endo/Heme/Allergies: Bruises/bleeds easily.  Psychiatric/Behavioral: Negative for depression and memory loss. The patient is not nervous/anxious and does not have insomnia.     Vitals:   04/12/20 1819  BP: 130/88  Pulse: 78  Resp: 14  Temp: 97.9 F (36.6 C)  SpO2: 97%   There is no height or weight on file to calculate BMI. Physical Exam Vitals reviewed.  Constitutional:      General: She is not in acute distress.    Appearance: Normal appearance. She is ill-appearing. She is not toxic-appearing.  HENT:     Head: Normocephalic and atraumatic.     Right Ear: External ear normal.     Left Ear: External ear normal.     Ears:     Comments: Hearing aids, still HOH    Nose: Congestion present.     Mouth/Throat:     Pharynx: No oropharyngeal exudate.  Eyes:     Conjunctiva/sclera: Conjunctivae normal.     Pupils: Pupils are equal, round, and reactive to light.     Comments: glasses  Cardiovascular:     Rate and Rhythm: Rhythm irregular.     Heart sounds: No murmur heard.   Pulmonary:     Breath sounds: Wheezing and rhonchi present.     Comments: Increased effort Abdominal:     General: Bowel  sounds are normal. There is no distension.     Palpations: Abdomen is soft.     Tenderness: There is no abdominal tenderness. There is no guarding or rebound.  Musculoskeletal:        General: Normal range of motion.     Right lower leg: Edema present.     Left lower leg: Edema present.     Comments: 2+ edema bilaterally with venous ulceration open and draining on left posterior leg  Skin:    General: Skin is warm and dry.  Neurological:     General: No focal deficit present.     Mental Status: She is alert and oriented to person, place, and time.  Psychiatric:        Mood and Affect: Mood normal.        Behavior: Behavior normal.  Thought Content: Thought content normal.        Judgment: Judgment normal.     Labs reviewed: Basic Metabolic Panel: Recent Labs    09/20/19 1622 10/27/19 1928 10/28/19 0028  NA 140 142 139  K 4.3 3.5 3.6  CL 99 100 100  CO2 27 29 25   GLUCOSE 101* 102* 136*  BUN 19 21 19   CREATININE 1.20* 0.91 0.96  CALCIUM 9.7 9.4 9.3  MG  --  2.5*  --   PHOS  --  3.0  --    Liver Function Tests: Recent Labs    10/27/19 1928  AST 35  ALT 26  ALKPHOS 57  BILITOT 1.0  PROT 7.1  ALBUMIN 4.3   No results for input(s): LIPASE, AMYLASE in the last 8760 hours. No results for input(s): AMMONIA in the last 8760 hours. CBC: Recent Labs    07/03/19 0039 10/27/19 1928 10/28/19 0028  WBC 6.7 4.9 4.6  NEUTROABS 4.5 3.3 4.0  HGB 13.9 13.1 13.5  HCT 43.6 40.8 42.3  MCV 99.3 99.0 100.0  PLT 268 229 225   Cardiac Enzymes: No results for input(s): CKTOTAL, CKMB, CKMBINDEX, TROPONINI in the last 8760 hours. BNP: Invalid input(s): POCBNP Lab Results  Component Value Date   HGBA1C 5.7 (H) 06/25/2017   Lab Results  Component Value Date   TSH 1.05 04/30/2016   No results found for: VITAMINB12 No results found for: FOLATE No results found for: IRON, TIBC, FERRITIN  Imaging and Procedures obtained prior to SNF admission: DG Chest 2  View  Result Date: 10/27/2019 CLINICAL DATA:  Productive cough EXAM: CHEST - 2 VIEW COMPARISON:  10/29/2018 FINDINGS: Focal airspace infiltrate is noted within the left lower lobe, possibly infectious in the appropriate clinical setting. Aspiration could also appear similarly. No pneumothorax or pleural effusion. Mild cardiomegaly is unchanged. Thoracic aorta is ectatic. Left subclavian dual lead pacemaker is unchanged. Pulmonary vascularity is normal. Degenerative changes are seen within the thoracolumbar spine. IMPRESSION: Left lower lobe focal pulmonary infiltrate, infection versus aspiration. Electronically Signed   By: Fidela Salisbury MD   On: 10/27/2019 17:57    Assessment/Plan 1. Decompensated COPD with exacerbation (chronic obstructive pulmonary disease) ([place]) Await CXR Isolation covid PCR Cont home inhalers Pulmonary has started her on doxy and prednisone burst of 20mg  daily with breakfast for 5 days Continue regular prednisone 5mg  daily so it does not get dropped and she has an adrenal crisis  2. Chronic diastolic congestive heart failure (HCC) Resume compression stockings May need increased diuretic short-term if effusions on xray or pulmonary edema May need to wrap legs if continue to leak  3. Chronic venous insufficiency Resume compression stockings May need increased diuretic short-term if effusions on xray or pulmonary edema May need to wrap legs if continue to leak  4. Venous ulcer of right leg (HCC) Change dressing qod and prn  5. Permanent atrial fibrillation (HCC) -cont current mgt, rate is controlled, cont NOAC  Family/ staff Communication: d/w rehab nurses and [place] [facility] nurse  Labs/tests ordered:  CXR pending, covid PCR  Dhilan Brauer L. Epsie Walthall, D.O. [facility] [address], [place] [address] Cell Phone (Mon-Fri 8am-5pm):  [phone] On Call:  [phone] & follow prompts after 5pm & weekends Office Phone:   [phone] Office Fax:  [phone]

## 2020-04-12 NOTE — Telephone Encounter (Signed)
Suggest - dc  Zpak. Please replace it with doxycycline 100 mg, # 14, 1 twice daily

## 2020-04-12 NOTE — Telephone Encounter (Signed)
Called and spoke with Herbert Seta, RN and she stated that the pt stated that the zpak never works for her and wanted to know if CY would send something else in.  Heather sent the order in for the cxr and they have already come and taken this.  CY please advise. Thanks  Allergies  Allergen Reactions  . Brimonidine Tartrate-Timolol Other (See Comments)    REACTION: systemic malaise amigen eye drop  . Penicillins Hives  . Breo Ellipta [Fluticasone Furoate-Vilanterol] Other (See Comments)    Ran blood pressure up Increased blood pressure     Current Outpatient Medications on File Prior to Visit  Medication Sig Dispense Refill  . amLODipine (NORVASC) 5 MG tablet TAKE 1 TABLET BY MOUTH  DAILY 90 tablet 3  . atorvastatin (LIPITOR) 40 MG tablet TAKE 1 TABLET BY MOUTH ONCE DAILY FOR CHOLESTEROL 90 tablet 1  . azelastine (ASTELIN) 0.1 % nasal spray USE 1 TO 2 SPRAYS INTO BOTH NOSTRILS TWICE DAILY. 30 mL PRN  . azithromycin (ZITHROMAX) 250 MG tablet Take 2 today, then 1 daily until gone. 6 tablet 0  . Cholecalciferol (VITAMIN D) 2000 units CAPS Take 1 capsule by mouth daily.    . fluticasone (FLONASE) 50 MCG/ACT nasal spray USE 2 SPRAYS EACH NOSTRIL ONCE A DAY AS NEEDED FOR ALLERGIES OR CONGESTION. 16 g PRN  . furosemide (LASIX) 40 MG tablet Take 1.5 tablets (60 mg total) by mouth daily. Ok to use an extra dose of 40 mg for weight gain 90 tablet 1  . gabapentin (NEURONTIN) 100 MG capsule TAKE 1 CAPSULE BY MOUTH AT  BEDTIME 90 capsule 3  . latanoprost (XALATAN) 0.005 % ophthalmic solution Place 1 drop into both eyes at bedtime.    [name] levothyroxine (SYNTHROID) 88 MCG tablet TAKE 1 TABLET BY MOUTH  DAILY BEFORE BREAKFAST 90 tablet 3  . losartan (COZAAR) 100 MG tablet Take 100 mg by mouth 2 (two) times daily.    . LUTEIN PO Take 25 mg by mouth daily.     . Magnesium 250 MG TABS Take 1 tablet by mouth daily.    . melatonin 3 MG TABS tablet Take 1 tablet (3 mg total) by mouth at bedtime. 90 tablet 3  .  metoprolol succinate (TOPROL-XL) 100 MG 24 hr tablet Take 100 mg by mouth daily.    . montelukast (SINGULAIR) 10 MG tablet TAKE 1 TABLET BY MOUTH  DAILY 90 tablet 3  . Multiple Vitamins-Minerals (PRESERVISION AREDS 2) CAPS Take 1 capsule by mouth in the morning and at bedtime.     . potassium chloride (KLOR-CON) 10 MEQ tablet Take 10 mEq by mouth daily.    . potassium chloride SA (KLOR-CON) 20 MEQ tablet Take 20 mEq by mouth daily.    . predniSONE (DELTASONE) 20 MG tablet Take 1 tablet (20 mg total) by mouth daily with breakfast. 5 tablet 0  . predniSONE (DELTASONE) 5 MG tablet Take 1 tablet (5 mg total) by mouth daily with breakfast. 90 tablet 1  . Rivaroxaban (XARELTO) 15 MG TABS tablet TAKE 1 TABLET BY MOUTH  DAILY WITH SUPPER 10 tablet 1  . SYMBICORT 160-4.5 MCG/ACT inhaler INHALE 2 PUFFS BY MOUTH  INTO THE LUNGS TWO TIMES  DAILY 30.6 g 3  . VENTOLIN HFA 108 (90 Base) MCG/ACT inhaler USE 2 PUFFS EVERY 6 HOURS AS NEEDED FOR SHORTNESS OF BREATH AND WHEEZING. 18 g 0   No current facility-administered medications on file prior to visit.

## 2020-04-12 NOTE — Telephone Encounter (Signed)
Spoke with nurse heather, aware of recs.  rx's sent to preferred pharmacy.  Herbert Seta states that they are serviced by [facility], asked that send xray order there.   [facility] Phone: [phone]  Fax: [phone]   Attempted to call to place xray order but had to leave a vm.  Faxed cxr order to number above.  Nothing further needed at this time- will close encounter.

## 2020-04-12 NOTE — Telephone Encounter (Signed)
I have called and spoke with Herbert Seta, RN and she is aware of the change per CY.  She will give the first dose of doxy tonight.  I have updated the med list and sent the doxy to the pharmacy.

## 2020-04-12 NOTE — Telephone Encounter (Signed)
Spoke with nurse, states pt c/o Wheezing, sinus congestion, runny nose, pnd, dyspnea with any exertion, prod cough with unknown color mucus.   Using symbicort, albuterol inhalers, as well as nasocort.  S/s present X3-4 days, progressively worsening per nurse.   Denies fever, headache, loss of taste/smell, nausea/vomiting/diarhhea, night sweats.   Pt has received flu shot as well as covid vaccines with booster.  O2 sats 96% on room air at rest, pulse 72.  Nurse is requesting recs, wonders if pt needs a chest xray.   Pharmacy: [facility]   CY please advise on recs, thanks!

## 2020-04-13 DIAGNOSIS — R062 Wheezing: Secondary | ICD-10-CM | POA: Diagnosis not present

## 2020-04-13 DIAGNOSIS — Z9189 Other specified personal risk factors, not elsewhere classified: Secondary | ICD-10-CM | POA: Diagnosis not present

## 2020-04-13 DIAGNOSIS — Z20828 Contact with and (suspected) exposure to other viral communicable diseases: Secondary | ICD-10-CM | POA: Diagnosis not present

## 2020-04-15 ENCOUNTER — Encounter: Payer: Self-pay | Admitting: Internal Medicine

## 2020-04-18 ENCOUNTER — Non-Acute Institutional Stay (SKILLED_NURSING_FACILITY): Payer: Medicare Other | Admitting: Internal Medicine

## 2020-04-18 DIAGNOSIS — I872 Venous insufficiency (chronic) (peripheral): Secondary | ICD-10-CM

## 2020-04-18 DIAGNOSIS — I83019 Varicose veins of right lower extremity with ulcer of unspecified site: Secondary | ICD-10-CM | POA: Diagnosis not present

## 2020-04-18 DIAGNOSIS — I5032 Chronic diastolic (congestive) heart failure: Secondary | ICD-10-CM

## 2020-04-18 DIAGNOSIS — L97919 Non-pressure chronic ulcer of unspecified part of right lower leg with unspecified severity: Secondary | ICD-10-CM | POA: Diagnosis not present

## 2020-04-18 DIAGNOSIS — J441 Chronic obstructive pulmonary disease with (acute) exacerbation: Secondary | ICD-10-CM

## 2020-04-18 DIAGNOSIS — I4821 Permanent atrial fibrillation: Secondary | ICD-10-CM | POA: Diagnosis not present

## 2020-04-18 NOTE — Progress Notes (Signed)
Location:  [facility] Room Number: 156 Place of Service:  SNF (31)  Provider: Sabrinna Yearwood L. Mariea Clonts, D.O., C.M.D.  PCP: Gayland Curry, DO Patient Care Team: Gayland Curry, DO as PCP - General (Geriatric Medicine) Jerline Pain, MD as PCP - Cardiology (Cardiology) Thompson Grayer, MD as Consulting Physician (Cardiology) Deneise Lever, MD as Consulting Physician (Pulmonary Disease) Rutherford Guys, MD as Consulting Physician (Ophthalmology) [name], MD as Consulting Physician (Gastroenterology) Gaynelle Arabian, MD as Consulting Physician (Orthopedic Surgery)  Extended Emergency Contact Information Primary Emergency Contact: [name] of [place] Phone: [phone] Mobile Phone: [phone] Relation: Son Secondary Emergency Contact: Hunt,Cheryl  [place] Mobile Phone: [phone] Relation: Daughter  Code Status: DNR Goals of care:  Advanced Directive information Advanced Directives 04/18/2020  Does Patient Have a Medical Advance Directive? Yes  Type of Advance Directive Out of facility DNR (pink MOST or yellow form)  Does patient want to make changes to medical advance directive? No - Patient declined  Copy of [place] in Chart? -  Would patient like information on creating a medical advance directive? -  Pre-existing out of facility DNR order (yellow form or pink MOST form) -     Allergies  Allergen Reactions  . Brimonidine Tartrate-Timolol Other (See Comments)    REACTION: systemic malaise amigen eye drop  . Penicillins Hives  . Breo Ellipta [Fluticasone Furoate-Vilanterol] Other (See Comments)    Ran blood pressure up Increased blood pressure    Chief Complaint  Patient presents with  . Discharge Note    Discharge from [facility]     HPI:  87 y.o. female with h/o afib, hyperlipidemia, COPD with frequent exacerbations, chronic diastolic chf among others  here for COPD exacerbation with considerable congestion and sob.  She'd been tachypneic at home per clinic nurse and we were concerned she'd decline at home.  CXR done and negative for pneumonia or chf.  She had considerable edema in her legs vs baseline and it turned out that she was not getting the proper diuretic dose (did not match epic orders from cardiology).  This was discovered during the rehab stay and then dose returned to normal 60mg  daily and prn additional 40mg  if weight gain.  She completed her course of doxycycline and 5 days of 20mg  prednisone per pulmonary for the COPD exacerbation.  COVID PCR finally returned negative.  She's been off her statin which was her decision due to cramping in her legs.  I did review its other benefits at the clinic visit where she made this choice and she still wanted to stop the statin.  CMA did not remove from list and it was still on there today even after her admission here.   Past Medical History:  Diagnosis Date  . Anemia   . Aortic stenosis    a. Echo 09/06/12 EF 55-60%, no WMA, G2DD, Ao valve sclerosis w/ mod stenosis, LA mildly dilated, PA pressure 8mmHg  . Asthmatic bronchitis   . Complete heart block [facility])    s/p permanent pacemaker 06/27/1999 (Battery change 06/2007 and 2016).  s/p AV nodal ablation by Dr [name] 2016.  [name] COPD (chronic obstructive pulmonary disease) (HCC)    Dr. [name]  . DDD (degenerative disc disease)   . Diastolic dysfunction    a. Echo 09/06/12 EF 55-60%, no WMA, G2DD, Ao valve sclerosis w/ mod stenosis, LA mildly dilated, PA pressure 47mmHg  . Diverticulosis   .  DVT (deep vein thrombosis) in pregnancy 1954   a. LLE  . Hypertension   . Hypothyroidism    on medication  . Macular degeneration    Dr. [name]  . Osteoarthrosis, unspecified whether generalized or localized, other specified sites   . PAF (paroxysmal atrial fibrillation) (HCC)    Stopped flecainide, on amiodarone but still has bouts of A FIB (mostly in  mornings).   . Pure hypercholesterolemia   . Staghorn calculus    Left    Past Surgical History:  Procedure Laterality Date  . APPENDECTOMY  ~ 1941  . AV NODE ABLATION  05/10/2014  . AV NODE ABLATION N/A 05/10/2014   Procedure: AV NODE ABLATION;  Surgeon: Thompson Grayer, MD;  Location: [facility] CATH LAB;  Service: Cardiovascular;  Laterality: N/A;  . BUNIONECTOMY WITH HAMMERTOE RECONSTRUCTION Bilateral ~ 1990  . CARDIAC PACEMAKER PLACEMENT  06/27/99   Medtronic PM implanted by Dr [name]  . CARDIOVERSION N/A 12/02/2013   Procedure: CARDIOVERSION;  Surgeon: [name], MD;  Location[facility] ENDOSCOPY;  Service: Cardiovascular;  Laterality: N/A;  . CATARACT EXTRACTION W/ INTRAOCULAR LENS  IMPLANT, BILATERAL Bilateral   . COLONOSCOPY WITH PROPOFOL N/A 04/22/2013   Procedure: COLONOSCOPY WITH PROPOFOL;  Surgeon: [name], MD;  Location: [facility] ENDOSCOPY;  Service: Endoscopy;  Laterality: N/A;  . DILATION AND CURETTAGE OF UTERUS  X 2   "when I was going thru menopause"  . ESOPHAGOGASTRODUODENOSCOPY (EGD) WITH PROPOFOL N/A 04/22/2013   Procedure: ESOPHAGOGASTRODUODENOSCOPY (EGD) WITH PROPOFOL;  Surgeon: [name], MD;  Location: [facility] ENDOSCOPY;  Service: Endoscopy;  Laterality: N/A;  . HERNIA REPAIR    . INCISIONAL HERNIA REPAIR    . INSERT / REPLACE / REMOVE PACEMAKER  06/2007   "took out the old; put in new"  . INSERT / REPLACE / REMOVE PACEMAKER  05/10/2014   MDT PPM generator change by Dr [name]  . JOINT REPLACEMENT    . PARTIAL NEPHRECTOMY Left 05/1974   stone disease  . PERMANENT PACEMAKER GENERATOR CHANGE N/A 05/10/2014   Procedure: PERMANENT PACEMAKER GENERATOR CHANGE;  Surgeon: Thompson Grayer, MD;  Location: [facility] CATH LAB;  Service: Cardiovascular;  Laterality: N/A;  . TONSILLECTOMY AND ADENOIDECTOMY  1930's  . TOTAL KNEE ARTHROPLASTY Right 2001      reports that she has never smoked. She has never used smokeless tobacco. She reports current alcohol use of about 7.0 standard drinks of alcohol  per week. She reports that she does not use drugs.  Functional Status Survey:    Allergies  Allergen Reactions  . Brimonidine Tartrate-Timolol Other (See Comments)    REACTION: systemic malaise amigen eye drop  . Penicillins Hives  . Breo Ellipta [Fluticasone Furoate-Vilanterol] Other (See Comments)    Ran blood pressure up Increased blood pressure    Pertinent  Health Maintenance Due  Topic Date Due  . DEXA SCAN  Never done  . INFLUENZA VACCINE  Completed  . PNA vac Low Risk Adult  Completed    Medications: Outpatient Encounter Medications as of 04/18/2020  Medication Sig  . amLODipine (NORVASC) 5 MG tablet TAKE 1 TABLET BY MOUTH  DAILY  . atorvastatin (LIPITOR) 40 MG tablet TAKE 1 TABLET BY MOUTH ONCE DAILY FOR CHOLESTEROL  . azelastine (ASTELIN) 0.1 % nasal spray USE 1 TO 2 SPRAYS INTO BOTH NOSTRILS TWICE DAILY.  [name] Cholecalciferol (VITAMIN D) 2000 units CAPS Take 1 capsule by mouth daily.  [name] doxycycline (VIBRA-TABS) 100 MG tablet Take 1 tablet (100 mg total) by mouth  2 (two) times daily.  . fluticasone (FLONASE) 50 MCG/ACT nasal spray USE 2 SPRAYS EACH NOSTRIL ONCE A DAY AS NEEDED FOR ALLERGIES OR CONGESTION.  . furosemide (LASIX) 40 MG tablet Take 1.5 tablets (60 mg total) by mouth daily. Ok to use an extra dose of 40 mg for weight gain  . gabapentin (NEURONTIN) 100 MG capsule TAKE 1 CAPSULE BY MOUTH AT  BEDTIME  . latanoprost (XALATAN) 0.005 % ophthalmic solution Place 1 drop into both eyes at bedtime.  [name] levothyroxine (SYNTHROID) 88 MCG tablet TAKE 1 TABLET BY MOUTH  DAILY BEFORE BREAKFAST  . losartan (COZAAR) 100 MG tablet Take 100 mg by mouth 2 (two) times daily.  . LUTEIN PO Take 25 mg by mouth daily.   . Magnesium 250 MG TABS Take 1 tablet by mouth daily.  . melatonin 3 MG TABS tablet Take 1 tablet (3 mg total) by mouth at bedtime.  . metoprolol succinate (TOPROL-XL) 100 MG 24 hr tablet Take 100 mg by mouth daily.  . montelukast (SINGULAIR) 10 MG tablet TAKE 1 TABLET  BY MOUTH  DAILY  . Multiple Vitamins-Minerals (PRESERVISION AREDS 2) CAPS Take 1 capsule by mouth in the morning and at bedtime.   . potassium chloride SA (KLOR-CON) 20 MEQ tablet Take 20 mEq by mouth daily.  . predniSONE (DELTASONE) 5 MG tablet Take 1 tablet (5 mg total) by mouth daily with breakfast.  . Rivaroxaban (XARELTO) 15 MG TABS tablet TAKE 1 TABLET BY MOUTH  DAILY WITH SUPPER  . SYMBICORT 160-4.5 MCG/ACT inhaler INHALE 2 PUFFS BY MOUTH  INTO THE LUNGS TWO TIMES  DAILY  . VENTOLIN HFA 108 (90 Base) MCG/ACT inhaler USE 2 PUFFS EVERY 6 HOURS AS NEEDED FOR SHORTNESS OF BREATH AND WHEEZING.  . [DISCONTINUED] predniSONE (DELTASONE) 20 MG tablet Take 1 tablet (20 mg total) by mouth daily with breakfast.   No facility-administered encounter medications on file as of 04/18/2020.    Review of Systems  Constitutional: Negative for chills, fever and malaise/fatigue.  HENT: Positive for hearing loss. Negative for congestion and sore throat.   Eyes: Positive for blurred vision.       Glasses  Respiratory: Positive for cough and sputum production. Negative for shortness of breath and wheezing.        Improved, but still has a lot of sputum  Cardiovascular: Negative for chest pain, palpitations and leg swelling.       Edema much improved  Gastrointestinal: Negative for abdominal pain, blood in stool, constipation, diarrhea and melena.  Genitourinary: Positive for frequency.       Urinating more after got wrong dose of diuretic at first  Musculoskeletal: Negative for falls and joint pain.  Skin: Negative for itching and rash.  Neurological: Negative for dizziness and loss of consciousness.  Endo/Heme/Allergies: Bruises/bleeds easily.  Psychiatric/Behavioral: Negative for depression and memory loss. The patient is not nervous/anxious and does not have insomnia.     Vitals:   04/18/20 1527  BP: (!) 146/78  Pulse: 86  Temp: (!) 97.5 F (36.4 C)  Weight: 132 lb 9.6 oz (60.1 kg)  Height: 5'  (1.524 m)   Body mass index is 25.9 kg/m. Physical Exam Vitals reviewed.  Constitutional:      General: She is not in acute distress.    Appearance: Normal appearance. She is not toxic-appearing.  HENT:     Head: Normocephalic and atraumatic.     Ears:     Comments: HOH    Nose: Nose normal.  Mouth/Throat:     Mouth: Mucous membranes are dry.  Eyes:     Extraocular Movements: Extraocular movements intact.     Conjunctiva/sclera: Conjunctivae normal.     Pupils: Pupils are equal, round, and reactive to light.  Cardiovascular:     Rate and Rhythm: Rhythm irregular.     Heart sounds: Murmur heard.    Pulmonary:     Effort: Pulmonary effort is normal.     Breath sounds: Rhonchi present. No wheezing.  Abdominal:     General: Bowel sounds are normal. There is no distension.     Tenderness: There is no abdominal tenderness. There is no guarding or rebound.  Musculoskeletal:     Cervical back: Neck supple.     Right lower leg: No edema.     Left lower leg: No edema.  Lymphadenopathy:     Cervical: No cervical adenopathy.  Skin:    Comments: Posterior leg wound dressed  Neurological:     General: No focal deficit present.     Mental Status: She is alert and oriented to person, place, and time.  Psychiatric:        Mood and Affect: Mood normal.        Behavior: Behavior normal.        Thought Content: Thought content normal.        Judgment: Judgment normal.     Labs reviewed: Basic Metabolic Panel: Recent Labs    09/20/19 1622 10/27/19 1928 10/28/19 0028  NA 140 142 139  K 4.3 3.5 3.6  CL 99 100 100  CO2 27 29 25   GLUCOSE 101* 102* 136*  BUN 19 21 19   CREATININE 1.20* 0.91 0.96  CALCIUM 9.7 9.4 9.3  MG  --  2.5*  --   PHOS  --  3.0  --    Liver Function Tests: Recent Labs    10/27/19 1928  AST 35  ALT 26  ALKPHOS 57  BILITOT 1.0  PROT 7.1  ALBUMIN 4.3   No results for input(s): LIPASE, AMYLASE in the last 8760 hours. No results for input(s):  AMMONIA in the last 8760 hours. CBC: Recent Labs    07/03/19 0039 10/27/19 1928 10/28/19 0028  WBC 6.7 4.9 4.6  NEUTROABS 4.5 3.3 4.0  HGB 13.9 13.1 13.5  HCT 43.6 40.8 42.3  MCV 99.3 99.0 100.0  PLT 268 229 225   Cardiac Enzymes: No results for input(s): CKTOTAL, CKMB, CKMBINDEX, TROPONINI in the last 8760 hours. BNP: Invalid input(s): POCBNP CBG: No results for input(s): GLUCAP in the last 8760 hours.  Procedures and Imaging Studies During Stay: No results found.  Assessment/Plan:   1. Decompensated COPD with exacerbation (chronic obstructive pulmonary disease) (HCC) Congestion much improved Last day of doxy is tomorrow  Completed last day of prednisone 20mg  daily with breakfast  2. Chronic diastolic congestive heart failure (HCC) There was an error with her lasix and for some reason she was getting 40 daily instead of 60 daily despite most recent med list in epic which is what is used for admissions and med admin record from clinic nurse both showing 60mg  daily She's been back on the 60mg  the past two days and edema improving--she's had significant diuresis with lots of urination since she was put back on proper dose  3. Chronic venous insufficiency -elevate feet at rest -use compression hose daily at home  4. Permanent atrial fibrillation (HCC) -no changes, cont same regimen  5. Venous ulcer of right leg (HCC) -continue  polymem dressing changes and monitor--will look at again at here next appt unless concerns arise  D/c home  Patient is being discharged with the following home health services:  No new  Patient is being discharged with the following durable medical equipment:  No new  Patient has been advised to f/u with their PCP in 1-2 weeks to for a transitions of care visit.  Social services at their facility was responsible for arranging this appointment.  Pt was provided with adequate prescriptions of noncontrolled medications to reach the scheduled  appointment .  For controlled substances, a limited supply was provided as appropriate for the individual patient.  If the pt normally receives these medications from a pain clinic or has a contract with another physician, these medications should be received from that clinic or physician only).     Future labs/tests needed:  F/u bmp when seen in clinic in feb  Keep f/u with me as planned on 05/17/2020  Cherylene Ferrufino L. Selenne Coggin, D.O. [facility] [address], [place] [address] Cell Phone (Mon-Fri 8am-5pm):  [phone] On Call:  [phone] & follow prompts after 5pm & weekends Office Phone:  [phone] Office Fax:  [phone]

## 2020-04-24 ENCOUNTER — Encounter: Payer: Self-pay | Admitting: Internal Medicine

## 2020-05-17 ENCOUNTER — Non-Acute Institutional Stay: Payer: Medicare Other | Admitting: Internal Medicine

## 2020-05-17 ENCOUNTER — Encounter: Payer: Self-pay | Admitting: Internal Medicine

## 2020-05-17 ENCOUNTER — Other Ambulatory Visit: Payer: Self-pay

## 2020-05-17 VITALS — BP 122/82 | HR 92 | Temp 97.2°F | Ht 60.0 in | Wt 132.8 lb

## 2020-05-17 DIAGNOSIS — I5032 Chronic diastolic (congestive) heart failure: Secondary | ICD-10-CM

## 2020-05-17 DIAGNOSIS — Z7189 Other specified counseling: Secondary | ICD-10-CM

## 2020-05-17 DIAGNOSIS — I872 Venous insufficiency (chronic) (peripheral): Secondary | ICD-10-CM | POA: Diagnosis not present

## 2020-05-17 DIAGNOSIS — L03116 Cellulitis of left lower limb: Secondary | ICD-10-CM

## 2020-05-17 DIAGNOSIS — J449 Chronic obstructive pulmonary disease, unspecified: Secondary | ICD-10-CM

## 2020-05-17 DIAGNOSIS — I4821 Permanent atrial fibrillation: Secondary | ICD-10-CM

## 2020-05-17 MED ORDER — CEPHALEXIN 500 MG PO CAPS: 500.0000 mg | ORAL_CAPSULE | Freq: Two times a day (BID) | ORAL | 0 refills | Status: DC

## 2020-05-17 NOTE — Progress Notes (Signed)
Location:  Occupational psychologist of Service:  Clinic (12)  Provider: Al Bracewell L. Mariea Clonts, D.O., C.M.D.  Code Status: DNR, MOST completed today Goals of Care:  Advanced Directives 05/17/2020  Does Patient Have a Medical Advance Directive? Yes  Type of Advance Directive Out of facility DNR (pink MOST or yellow form)  Does patient want to make changes to medical advance directive? No - Patient declined  Copy of [place] in Chart? -  Would patient like information on creating a medical advance directive? -  Pre-existing out of facility DNR order (yellow form or pink MOST form) Pink MOST/Yellow Form most recent copy in chart - Physician notified to receive inpatient order   Chief Complaint  Patient presents with  . Medical Management of Chronic Issues    4 month follow up . Discuss pain left leg. Fill out handicap sticker paper work.     HPI: Patient is a 87 y.o. female seen today for medical management of chronic diseases.    Has left leg pain--woke her up hurting on the outside of her leg--now red, warm.  Wears compression hose.    Breathing improved.  Has recovered from most recent COPD exacerbation and CHF exacerbation where she went to rehab rather than the hospital.  Says she really doesn't want to return to the hospital.  She was in the ED for many hours and was really short of breath and miserable the whole time on a previous occasion.  She does not want to be put on machines including bipap.  If her respiratory status declines to that extent, she wants to be kept comfortable here at [facility] in rehab.  I recommended she discuss this with her daughter and son, also.  MOST completed as follows:   DNR Comfort measures--manage care in rehab at [facility] unless she has a fracture  IVFs for defined period if needed No feeding tube Antibiotics if indicated Form was uploaded to vynca and original given to her to put in her freezer with her DNR and  living will/hcpoa documents.   Copy made for clinic nurse and discussed with her.  Past Medical History:  Diagnosis Date  . Anemia   . Aortic stenosis    a. Echo 09/06/12 EF 55-60%, no WMA, G2DD, Ao valve sclerosis w/ mod stenosis, LA mildly dilated, PA pressure 31mmHg  . Asthmatic bronchitis   . Complete heart block [facility])    s/p permanent pacemaker 06/27/1999 (Battery change 06/2007 and 2016).  s/p AV nodal ablation by Dr [name] 2016.  [name] COPD (chronic obstructive pulmonary disease) (HCC)    Dr. [name]  . DDD (degenerative disc disease)   . Diastolic dysfunction    a. Echo 09/06/12 EF 55-60%, no WMA, G2DD, Ao valve sclerosis w/ mod stenosis, LA mildly dilated, PA pressure 61mmHg  . Diverticulosis   . DVT (deep vein thrombosis) in pregnancy 1954   a. LLE  . Hypertension   . Hypothyroidism    on medication  . Macular degeneration    Dr. [name]  . Osteoarthrosis, unspecified whether generalized or localized, other specified sites   . PAF (paroxysmal atrial fibrillation) (HCC)    Stopped flecainide, on amiodarone but still has bouts of A FIB (mostly in mornings).   . Pure hypercholesterolemia   . Staghorn calculus    Left    Past Surgical History:  Procedure Laterality Date  . APPENDECTOMY  ~ 1941  . AV NODE ABLATION  05/10/2014  . AV NODE  ABLATION N/A 05/10/2014   Procedure: AV NODE ABLATION;  Surgeon: Thompson Grayer, MD;  Location: [facility] CATH LAB;  Service: Cardiovascular;  Laterality: N/A;  . BUNIONECTOMY WITH HAMMERTOE RECONSTRUCTION Bilateral ~ 1990  . CARDIAC PACEMAKER PLACEMENT  06/27/99   Medtronic PM implanted by Dr [name]  . CARDIOVERSION N/A 12/02/2013   Procedure: CARDIOVERSION;  Surgeon: [name], MD;  Location: [facility] ENDOSCOPY;  Service: Cardiovascular;  Laterality: N/A;  . CATARACT EXTRACTION W/ INTRAOCULAR LENS  IMPLANT, BILATERAL Bilateral   . COLONOSCOPY WITH PROPOFOL N/A 04/22/2013   Procedure: COLONOSCOPY WITH PROPOFOL;  Surgeon: [name], MD;  Location: [facility]  ENDOSCOPY;  Service: Endoscopy;  Laterality: N/A;  . DILATION AND CURETTAGE OF UTERUS  X 2   "when I was going thru menopause"  . ESOPHAGOGASTRODUODENOSCOPY (EGD) WITH PROPOFOL N/A 04/22/2013   Procedure: ESOPHAGOGASTRODUODENOSCOPY (EGD) WITH PROPOFOL;  Surgeon: [name], MD;  Location: [facility] ENDOSCOPY;  Service: Endoscopy;  Laterality: N/A;  . HERNIA REPAIR    . INCISIONAL HERNIA REPAIR    . INSERT / REPLACE / REMOVE PACEMAKER  06/2007   "took out the old; put in new"  . INSERT / REPLACE / REMOVE PACEMAKER  05/10/2014   MDT PPM generator change by Dr [name]  . JOINT REPLACEMENT    . PARTIAL NEPHRECTOMY Left 05/1974   stone disease  . PERMANENT PACEMAKER GENERATOR CHANGE N/A 05/10/2014   Procedure: PERMANENT PACEMAKER GENERATOR CHANGE;  Surgeon: Thompson Grayer, MD;  Location: [facility] CATH LAB;  Service: Cardiovascular;  Laterality: N/A;  . TONSILLECTOMY AND ADENOIDECTOMY  1930's  . TOTAL KNEE ARTHROPLASTY Right 2001    Allergies  Allergen Reactions  . Brimonidine Tartrate-Timolol Other (See Comments)    REACTION: systemic malaise amigen eye drop  . Penicillins Hives  . Breo Ellipta [Fluticasone Furoate-Vilanterol] Other (See Comments)    Ran blood pressure up Increased blood pressure    Outpatient Encounter Medications as of 05/17/2020  Medication Sig  . amLODipine (NORVASC) 5 MG tablet TAKE 1 TABLET BY MOUTH  DAILY  . azelastine (ASTELIN) 0.1 % nasal spray USE 1 TO 2 SPRAYS INTO BOTH NOSTRILS TWICE DAILY.  [name] Cholecalciferol (VITAMIN D) 2000 units CAPS Take 1 capsule by mouth daily.  . fluticasone (FLONASE) 50 MCG/ACT nasal spray USE 2 SPRAYS EACH NOSTRIL ONCE A DAY AS NEEDED FOR ALLERGIES OR CONGESTION.  . furosemide (LASIX) 40 MG tablet Take 1.5 tablets (60 mg total) by mouth daily. Ok to use an extra dose of 40 mg for weight gain  . gabapentin (NEURONTIN) 100 MG capsule TAKE 1 CAPSULE BY MOUTH AT  BEDTIME  . latanoprost (XALATAN) 0.005 % ophthalmic solution Place 1 drop into both eyes  at bedtime.  [name] levothyroxine (SYNTHROID) 88 MCG tablet TAKE 1 TABLET BY MOUTH  DAILY BEFORE BREAKFAST  . losartan (COZAAR) 100 MG tablet Take 100 mg by mouth 2 (two) times daily. 50 at noon and a 100 @ night  . LUTEIN PO Take 25 mg by mouth daily.   . Magnesium 250 MG TABS Take 1 tablet by mouth daily.  . metoprolol succinate (TOPROL-XL) 100 MG 24 hr tablet Take 100 mg by mouth daily.  . montelukast (SINGULAIR) 10 MG tablet TAKE 1 TABLET BY MOUTH  DAILY  . Multiple Vitamins-Minerals (PRESERVISION AREDS 2) CAPS Take 1 capsule by mouth in the morning and at bedtime.   . potassium chloride SA (KLOR-CON) 20 MEQ tablet Take 20 mEq by mouth daily.  . predniSONE (DELTASONE) 5 MG tablet Take 1  tablet (5 mg total) by mouth daily with breakfast.  . Rivaroxaban (XARELTO) 15 MG TABS tablet TAKE 1 TABLET BY MOUTH  DAILY WITH SUPPER  . SYMBICORT 160-4.5 MCG/ACT inhaler INHALE 2 PUFFS BY MOUTH  INTO THE LUNGS TWO TIMES  DAILY  . VENTOLIN HFA 108 (90 Base) MCG/ACT inhaler USE 2 PUFFS EVERY 6 HOURS AS NEEDED FOR SHORTNESS OF BREATH AND WHEEZING.  . [DISCONTINUED] doxycycline (VIBRA-TABS) 100 MG tablet Take 1 tablet (100 mg total) by mouth 2 (two) times daily.  . [DISCONTINUED] melatonin 3 MG TABS tablet Take 1 tablet (3 mg total) by mouth at bedtime.   No facility-administered encounter medications on file as of 05/17/2020.    Review of Systems:  Review of Systems  Constitutional: Negative for chills, fever and malaise/fatigue.  HENT: Positive for hearing loss. Negative for congestion and sore throat.   Eyes: Positive for blurred vision.  Respiratory: Positive for cough and shortness of breath. Negative for sputum production and wheezing.        Improved--back to baseline  Cardiovascular: Positive for leg swelling. Negative for chest pain, palpitations and orthopnea.       Left greater than right with area of erythema and tenderness  Gastrointestinal: Negative for abdominal pain.  Genitourinary: Negative  for dysuria.  Musculoskeletal: Negative for falls and joint pain.  Skin: Negative for itching and rash.  Neurological: Negative for dizziness and loss of consciousness.  Endo/Heme/Allergies: Bruises/bleeds easily.  Psychiatric/Behavioral: Negative for depression and memory loss. The patient is not nervous/anxious and does not have insomnia.     Health Maintenance  Topic Date Due  . DEXA SCAN  Never done  . TETANUS/TDAP  02/23/2029  . INFLUENZA VACCINE  Completed  . COVID-19 Vaccine  Completed  . PNA vac Low Risk Adult  Completed    Physical Exam: Vitals:   05/17/20 1147  BP: 122/82  Pulse: 92  Temp: (!) 97.2 F (36.2 C)  SpO2: 98%  Weight: 132 lb 12.8 oz (60.2 kg)  Height: 5' (1.524 m)   Body mass index is 25.94 kg/m. Physical Exam Vitals reviewed.  Constitutional:      General: She is not in acute distress.    Appearance: Normal appearance. She is not toxic-appearing.  HENT:     Head: Normocephalic and atraumatic.     Right Ear: External ear normal.     Left Ear: External ear normal.     Ears:     Comments: HOH, hearing aids Eyes:     Comments: Declining vision, glasses  Cardiovascular:     Rate and Rhythm: Rhythm irregular.  Pulmonary:     Effort: Pulmonary effort is normal.     Breath sounds: Wheezing present. No rhonchi.     Comments: Minimal wheezing, much improved Abdominal:     General: Bowel sounds are normal.  Musculoskeletal:        General: Normal range of motion.     Cervical back: Neck supple.     Comments: LLE edema vs RLE  Lymphadenopathy:     Cervical: No cervical adenopathy.  Skin:    Comments: Patch of erythema with warmth, tenderness on left lower leg  Neurological:     General: No focal deficit present.     Mental Status: She is alert and oriented to person, place, and time.     Comments: Occasional unsteadiness  Psychiatric:        Mood and Affect: Mood normal.        Behavior: Behavior normal.  Thought Content: Thought  content normal.        Judgment: Judgment normal.     Labs reviewed: Basic Metabolic Panel: Recent Labs    09/20/19 1622 10/27/19 1928 10/28/19 0028  NA 140 142 139  K 4.3 3.5 3.6  CL 99 100 100  CO2 27 29 25   GLUCOSE 101* 102* 136*  BUN 19 21 19   CREATININE 1.20* 0.91 0.96  CALCIUM 9.7 9.4 9.3  MG  --  2.5*  --   PHOS  --  3.0  --    Liver Function Tests: Recent Labs    10/27/19 1928  AST 35  ALT 26  ALKPHOS 57  BILITOT 1.0  PROT 7.1  ALBUMIN 4.3   No results for input(s): LIPASE, AMYLASE in the last 8760 hours. No results for input(s): AMMONIA in the last 8760 hours. CBC: Recent Labs    07/03/19 0039 10/27/19 1928 10/28/19 0028  WBC 6.7 4.9 4.6  NEUTROABS 4.5 3.3 4.0  HGB 13.9 13.1 13.5  HCT 43.6 40.8 42.3  MCV 99.3 99.0 100.0  PLT 268 229 225   Lipid Panel: No results for input(s): CHOL, HDL, LDLCALC, TRIG, CHOLHDL, LDLDIRECT in the last 8760 hours. Lab Results  Component Value Date   HGBA1C 5.7 (H) 06/25/2017   Assessment/Plan 1. Left leg cellulitis - new onset - will treat with keflex for one week -elevate at rest -notify me if not improving - cephALEXin (KEFLEX) 500 MG capsule; Take 1 capsule (500 mg total) by mouth in the morning and at bedtime.  Dispense: 14 capsule; Refill: 0 -cont regular probiotic (dislikes yogurt)  2. Chronic diastolic congestive heart failure (HCC) - had volume overload in rehab but was due to error with lasix - now back on track and euvolemic with lasix and potassium - Amb Referral to Palliative Care -low sodium diet  3. Chronic venous insufficiency - continue compression hose -elevate feet at rest - Amb Referral to Palliative Care  4. Permanent atrial fibrillation (HCC) - remains -cont toprol xl, xarelto NOAC - Amb Referral to Palliative Care  5. COPD mixed type ([place]) - biggest issue for her and life-limiting -cont symbicort, prn ventolin, prednisone  - Amb Referral to Palliative Care to be followed  long-term due to frequent exacerbations, goal to avoid hospitalization and manage at [facility] (see MOST above)  Advance care planning:  25 mins spent discussing palliative care and completing MOST form with her  Labs/tests ordered:  No new   Next appt:  09/20/2020   Melania Kirks L. Rehana Uncapher, D.O. [facility] [address], [place] [address] Cell Phone (Mon-Fri 8am-5pm):  [phone] On Call:  [phone] & follow prompts after 5pm & weekends Office Phone:  [phone] Office Fax:  [phone]

## 2020-05-22 ENCOUNTER — Other Ambulatory Visit: Payer: Self-pay | Admitting: Nurse Practitioner

## 2020-05-22 ENCOUNTER — Other Ambulatory Visit: Payer: Self-pay

## 2020-05-22 DIAGNOSIS — J42 Unspecified chronic bronchitis: Secondary | ICD-10-CM

## 2020-05-22 DIAGNOSIS — Z515 Encounter for palliative care: Secondary | ICD-10-CM | POA: Diagnosis not present

## 2020-05-22 NOTE — Progress Notes (Signed)
[facility] Consult Note Telephone: [phone]  Fax: [phone]  PATIENT NAME: Jody Blake [address] [place] Jody Blake [address] [place] [address] [phone] (home)  DOB: 05/21/1925 MRN: [id number]  PRIMARY CARE PROVIDER:    Alferd Apa,  [place] [address] [phone]  REFERRING PROVIDER:   Gayland Curry, DO [address] [place],  [place] [address] [phone]  RESPONSIBLE PARTY:   Extended Emergency Contact Information Primary Emergency Contact: Jody Blake Phone: [phone] Mobile Phone: [phone] Relation: Son Secondary Emergency Contact: Blake,Jody  [place] Mobile Phone: [phone] Relation: Daughter  I met face to face with patient in home.  ASSESSMENT AND RECOMMENDATIONS:   Advance Care Planning: Today's visit consisted of building trust and discussions on Palliative care medicine as specialized medical care for people living with serious illness, aimed at facilitating improved quality of life through symptoms relief, assisting with advance care plannig and establishing goals of care. Family expressed appreciation for education provided on Palliative care and how that differs from Hospice service. Palliative care will continue to provide support to patient, family and the medical team. Goal of care: Patient's goal of care is comfort, patient does not desire hospitalization, she however verbalized that she would want hospitalization if she fell and fractured a bone or if her pacemaker requires battery replacement. Otherwise she want to be treated in the facility.  Directives: Patient does not desire resuscitation in the event of cardiac or respiratory arrest. She has a signed DNR and MOST forms in home, copies uploaded to [facility] EMR. Details of MOST include comfort measures, antibiotics if indicated, IV fluids for a defined  trial period, no feeding tube.  Symptom Management:  Patient with history frequent COPD exacerbations, last episode was last month where she was treated in house [facility]. Patient denied acute cough or increased SOB today, report feeling well overall.  She is currently on Keflex for treatment of left lower leg cellulitis, tolerating med without complaints. Area on left lower leg (ankle area) dry and intact, do drainage or open wound noted, +1 edema noted. No report of fever or chills. Denied pain. Patient on anticoagulant Xarelto without report of acute bleed.  Patient with poor vision, feels way within her home. She lives alone, report she does not want to move because she is used to her home and knows where everything is. She is however at risk for fall or injury related to poor vision and being hard of hearing. Patient would benefit from transitioning to an Assisted living facility, ongoing discussion needed. Questions and concerns were addressed. Provided general support and encouragement, no other unmet needs identified  Follow up Palliative Care Visit: Palliative care will continue to follow for complex decision making and symptom management. Return in about 4 weeks or prn.  Family /Caregiver/Community Supports: Patient lives alone in the independent living apartment of [facility].She lost her husband last year, they have 2 children.   Cognitive / Functional decline: Patient with poor vision from Macular degeneration, she is very hard of hearing. She is coherent and able to make her own decisions, completes her ADLs independently. Walks mostly without assistive device, no report of falls.   I spent 50 minutes providing this consultation, time includes time spent with patient/family, chart review, provider coordination, and documentation. More than 50% of the time in this consultation was spent counseling  and coordinating communication.    CHIEF COMPLAINT: Initial palliative care visit  History obtained from review of EMR, and discussion with patient. Records reviewed and summarized bellow.  HISTORY OF PRESENT ILLNESS:  Jody Blake is a 87 y.o. year old female with multiple medical problems including COPD with frequent exacerbations, Afib, aortic stenosis, degenerative disease, HTN, macular degeneration, Osteoarthritis. Palliative Care was asked to follow this patient by consultation request of Gayland Curry, DO to help address advance care planning, goals of care and symptoms management.   CODE STATUS: DNR  PPS: 50%  HOSPICE ELIGIBILITY/DIAGNOSIS: TBD  PHYSICAL EXAM / ROS:   Current and past weights: 132lbs, Ht. 52f BMI 25.94kg/m2 General: NAD, frail appearing, cooperative Cardiovascular: denied chest pain, denied palpitation, +1 edema  Pulmonary: no cough, no SOB, room air GI: no report of swallowing issues, appetite fair, denied constipation, continent of bowel GU: denies dysuria, continent of urine MSK: no joint and ROM abnormalities, ambulatory Skin: no rashes or wounds reported or noted on exposed skin Neurological: Weakness, but otherwise nonfocal Psych: non -anxious affect  PAST MEDICAL HISTORY:  Past Medical History:  Diagnosis Date  . Anemia   . Aortic stenosis    a. Echo 09/06/12 EF 55-60%, no WMA, G2DD, Ao valve sclerosis w/ mod stenosis, LA mildly dilated, PA pressure 458mg  . Asthmatic bronchitis   . [facility]   s/p permanent pacemaker 06/27/1999 (Battery change 06/2007 and 2016).  s/p AV nodal ablation by Dr AlRayann Heman016.  . Marland KitchenOPD (chronic obstructive pulmonary disease) (HCC)    Dr. YoAnnamaria Boots. DDD (degenerative disc disease)   . Diastolic dysfunction    a. Echo 09/06/12 EF 55-60%, no WMA, G2DD, Ao valve sclerosis w/ mod stenosis, LA mildly dilated, PA pressure 4129m  . Diverticulosis   . DVT (deep vein thrombosis) in pregnancy 1954   a. LLE  . Hypertension   . Hypothyroidism     on medication  . Macular degeneration    Dr. KurEliezer Bottom Osteoarthrosis, unspecified whether generalized or localized, other specified sites   . PAF (paroxysmal atrial fibrillation) (HCC)    Stopped flecainide, on amiodarone but still has bouts of A FIB (mostly in mornings).   . Pure hypercholesterolemia   . Staghorn calculus    Left    SOCIAL HX:  Social History   Tobacco Use  . Smoking status: Never Smoker  . Smokeless tobacco: Never Used  Substance Use Topics  . Alcohol use: Yes    Alcohol/week: 7.0 standard drinks    Types: 7 Glasses of wine per week    Comment: occasionally   FAMILY HX:  Family History  Problem Relation Age of Onset  . Malignant hypertension Father   . Hypertension Father   . Renal Disease Father   . Breast cancer Mother   . Heart attack Brother   . Stroke Brother     ALLERGIES:  Allergies  Allergen Reactions  . Brimonidine Tartrate-Timolol Other (See Comments)    REACTION: systemic malaise amigen eye drop  . Penicillins Hives  . Breo Ellipta [Fluticasone Furoate-Vilanterol] Other (See Comments)    Ran blood pressure up Increased blood pressure     PERTINENT MEDICATIONS:  Outpatient Encounter Medications as of 05/22/2020  Medication Sig  . amLODipine (NORVASC) 5 MG tablet TAKE 1 TABLET BY MOUTH  DAILY  . azelastine (ASTELIN) 0.1 % nasal spray USE 1 TO 2 SPRAYS INTO BOTH NOSTRILS TWICE DAILY.  . cephALEXin (KEFLEX) 500 MG capsule  Take 1 capsule (500 mg total) by mouth in the morning and at bedtime.  . Cholecalciferol (VITAMIN D) 2000 units CAPS Take 1 capsule by mouth daily.  . fluticasone (FLONASE) 50 MCG/ACT nasal spray USE 2 SPRAYS EACH NOSTRIL ONCE A DAY AS NEEDED FOR ALLERGIES OR CONGESTION.  . furosemide (LASIX) 40 MG tablet Take 1.5 tablets (60 mg total) by mouth daily. Ok to use an extra dose of 40 mg for weight gain  . gabapentin (NEURONTIN) 100 MG capsule TAKE 1 CAPSULE BY MOUTH AT  BEDTIME  . latanoprost (XALATAN) 0.005 % ophthalmic  solution Place 1 drop into both eyes at bedtime.  [name] levothyroxine (SYNTHROID) 88 MCG tablet TAKE 1 TABLET BY MOUTH  DAILY BEFORE BREAKFAST  . losartan (COZAAR) 100 MG tablet Take 100 mg by mouth 2 (two) times daily. 50 at noon and a 100 @ night  . LUTEIN PO Take 25 mg by mouth daily.   . Magnesium 250 MG TABS Take 1 tablet by mouth daily.  . metoprolol succinate (TOPROL-XL) 100 MG 24 hr tablet Take 100 mg by mouth daily.  . montelukast (SINGULAIR) 10 MG tablet TAKE 1 TABLET BY MOUTH  DAILY  . Multiple Vitamins-Minerals (PRESERVISION AREDS 2) CAPS Take 1 capsule by mouth in the morning and at bedtime.   . potassium chloride SA (KLOR-CON) 20 MEQ tablet Take 20 mEq by mouth daily.  . predniSONE (DELTASONE) 5 MG tablet Take 1 tablet (5 mg total) by mouth daily with breakfast.  . Rivaroxaban (XARELTO) 15 MG TABS tablet TAKE 1 TABLET BY MOUTH  DAILY WITH SUPPER  . SYMBICORT 160-4.5 MCG/ACT inhaler INHALE 2 PUFFS BY MOUTH  INTO THE LUNGS TWO TIMES  DAILY  . VENTOLIN HFA 108 (90 Base) MCG/ACT inhaler USE 2 PUFFS EVERY 6 HOURS AS NEEDED FOR SHORTNESS OF BREATH AND WHEEZING.   No facility-administered encounter medications on file as of 05/22/2020.    Thank you for the opportunity to participate in the care of Ms. Flossie Radde. The palliative care team will continue to follow. Please call our office at [phone] if we can be of additional assistance.  Jari Favre, DNP, AGPCNP-BC

## 2020-05-23 DIAGNOSIS — H401132 Primary open-angle glaucoma, bilateral, moderate stage: Secondary | ICD-10-CM | POA: Diagnosis not present

## 2020-05-24 NOTE — Progress Notes (Signed)
Subjective:    Patient ID: Jody Blake, female    DOB: [birth year], 87 y.o.   MRN: [id number]  HPI F never smoker, followed for allergic rhinitis, chronic bronchitis, complicated by GERD, Hx PAfib/ pacemaker. ------------------------------------------------------------------------------------------------   10/25/19- 87 year old female never smoker followed for Allergic rhinitis, COPD mixed type, complicated by GERD,  AFib/pacemaker/ Xarelto, Glaucoma/ macular degen, dCHF, AoStenosis, CKDIII, TIA Singulair, Proair hfa, Symbicort 160, flonase, astelin ----productive cough with a lot of clear sputum, started about a week ago Acute visit- cough, wheeze and clear/ yellow sputum x 6 days UTD Moderna Covax Reports cough, no fever, began while on way to beach with family. No recognized sick exposure. Scant sputum.  Not using inhalers as instructed- education done.   05/25/20- 87 year old female never smoker followed for Allergic rhinitis, COPD mixed type, complicated by GERD,  AFib/pacemaker/ Xarelto, Glaucoma/ macular degen, dCHF, AoStenosis, CKDIII, TIA -Singulair, Proair hfa, Symbicort 160, flonase, astelin Covid vax- Flu vax-  Hosp last summer with CAP L lung, Acute flu-like illness 1/5> we called Zpak but pt said Zpak never works for her, then changed to doxy w pred taper. Had CXR at [place] then. Using rescue inhaler 1-2x/ day. Not walking for exercise due to cellulitis in leg. Using Flonase routinely for drainage. CXR 11/23/19- IMPRESSION: 1. Resolution of previously noted left lower lobe pneumonia. 2. Mild cardiomegaly. 3. Aortic atherosclerosis.  ROS-see HPI   + = positive Constitutional:    weight loss, night sweats, fevers, chills, fatigue, lassitude. HEENT:    headaches, difficulty swallowing, tooth/dental problems, sore throat,       sneezing, itching, ear ache, nasal congestion, post nasal drip, snoring CV:    chest pain, orthopnea, PND, swelling in lower extremities,  anasarca,                                                    dizziness, palpitations Resp:   shortness of breath with exertion or at rest.                + productive cough,   +non-productive cough, coughing up of blood.              change in color of mucus.  +wheezing.   Skin:    rash or lesions. GI:  No-   heartburn, indigestion, abdominal pain, nausea, vomiting,  GU:  MS:   joint pain, stiffness,  Neuro-     nothing unusual Psych:  change in mood or affect.  depression or anxiety.   memory loss.  Objective:  OBJ- Physical Exam General- Alert, Oriented, Affect-appropriate, Distress- none acute, + slender Skin- rash-none, lesions- none, excoriation- none Lymphadenopathy- none Head- atraumatic            Eyes- Gross vision intact, PERRLA, conjunctivae and secretions clear            Ears- + Hard of hearing            Nose- Clear, no-Septal dev, mucus, polyps, erosion, perforation             Throat- Mallampati II , mucosa clear , drainage- none, tonsils- atrophic Neck- flexible , trachea midline, no stridor , thyroid nl, carotid no bruit Chest - symmetrical excursion , unlabored           Heart/CV- RRR , no murmur , no gallop  ,  no rub, nl s1 s2                           - JVD- none , edema+ elastic hose, stasis changes- none, varices- none           Lung-  Wheeze -nonel, cough-none , dullness -none, rub- none           Chest wall-+ left pacemaker.  Abd-   Br/ Gen/ Rectal- Not done, not indicated Extrem- cyanosis- none, clubbing, none, atrophy- none, strength- nl Neuro- grossly intact to observation    Assessment & Plan:

## 2020-05-25 ENCOUNTER — Encounter: Payer: Self-pay | Admitting: Internal Medicine

## 2020-05-25 ENCOUNTER — Other Ambulatory Visit: Payer: Self-pay

## 2020-05-25 ENCOUNTER — Ambulatory Visit (INDEPENDENT_AMBULATORY_CARE_PROVIDER_SITE_OTHER): Payer: Medicare Other | Admitting: Internal Medicine

## 2020-05-25 DIAGNOSIS — J302 Other seasonal allergic rhinitis: Secondary | ICD-10-CM

## 2020-05-25 DIAGNOSIS — J449 Chronic obstructive pulmonary disease, unspecified: Secondary | ICD-10-CM | POA: Diagnosis not present

## 2020-05-25 DIAGNOSIS — J3089 Other allergic rhinitis: Secondary | ICD-10-CM | POA: Diagnosis not present

## 2020-05-25 NOTE — Patient Instructions (Signed)
We can continue current meds  Please call if we can help 

## 2020-05-29 ENCOUNTER — Encounter: Payer: Self-pay | Admitting: Internal Medicine

## 2020-06-14 DIAGNOSIS — H401132 Primary open-angle glaucoma, bilateral, moderate stage: Secondary | ICD-10-CM | POA: Diagnosis not present

## 2020-06-21 DIAGNOSIS — H401112 Primary open-angle glaucoma, right eye, moderate stage: Secondary | ICD-10-CM | POA: Diagnosis not present

## 2020-06-23 ENCOUNTER — Other Ambulatory Visit: Payer: Self-pay

## 2020-06-23 ENCOUNTER — Other Ambulatory Visit: Payer: Medicare Other | Admitting: Nurse Practitioner

## 2020-06-23 DIAGNOSIS — Z515 Encounter for palliative care: Secondary | ICD-10-CM | POA: Diagnosis not present

## 2020-06-23 NOTE — Progress Notes (Signed)
Jody Blake Consult Note Telephone: [phone]  Fax: [phone]  PATIENT NAME: Jody Blake [address] [place] [address] [phone] (home)  DOB: [birth year] MRN: [id number]  PRIMARY CARE PROVIDER:    Alferd Apa,  [place] [address] [phone]  REFERRING PROVIDER:   Gayland Curry, DO [address] [place],  [place] [address] [phone]  RESPONSIBLE PARTY:   Extended Emergency Contact Information Primary Emergency Contact: [place] Phone: [phone] Mobile Phone: [phone] Relation: Son Secondary Emergency Contact: Hunt,Cheryl  [place] Mobile Phone: [phone] Relation: Daughter  I met face to face with patient in home.  ASSESSMENT AND RECOMMENDATIONS:   Advance Care Planning: Goal of care: Patient's goal of care is comfort while preserving function. Patient does not desire hospitalization, she however would want hospitalization fractured bone or if her pacemaker requires battery replacement. Directives: Patient reiterated desire to not be resuscitated in the event of cardiac or respiratory arrest. Signed DNR and MOST forms in home, copies on [place] EMR. Details of MOST include comfort measures, antibiotics if indicated, IV fluids for a defined trial period, no feeding tube.  Symptom Management:  Shortness of breath: patient report occasional SOB with exertion, especially after taking a walk. Uses Albuterol rescue inhaler as needed, last used was yesterday, report using about 1-2 times a week. Patient was seen by her Pulmonologist Dr. Baird Lyons on 05/25/2020,  visit note reviewed, no change in plan of care. Patient voiced no other concerns today, report feeling well. Patient had no hospitalization or treated for any acute illness in the last month.  Recommendation: continue current plan of care.  Continue Albuterol rescue inhaler as needed. May consider using the inhaler before her walks. Provided general support and encouragement, no other unmet needs identified at this time.   Follow up Palliative Care Visit: Palliative care will continue to follow for complex decision making and symptom management. Return in about 6 weeks or prn.  Family /Caregiver/Community Supports: Patient lives alone in the independent living apartment of [facility], loss her husband last year, they have 2 children.   Cognitive / Functional decline: Patient awake, alert and coherent, very hard of hearing, has poor vision from Macular degeneration. She completes her ADLs independently. Walks mostly without assistive device, no report of falls.   I spent 30 minutes providing this consultation. More than 50% of the time in this consultation was spent counseling and coordinating communication.   CHIEF COMPLAINT: Follow up palliative care visit  History obtained from review of EMR and discussion with patient. Records reviewed and summarized bellow.  HISTORY OF PRESENT ILLNESS:  Jody Blake is a 87 y.o. year old female with multiple medical problems including COPD with frequent exacerbations, Afib with pacemaker (on Xarelto), aortic stenosis degenerative disease, HTN, Glaucoma/macular degeneration,  CKD 3, Osteoarthritis, hx of TIA Palliative Care was asked to help address advance care planning, goals of care and symptoms management.   CODE STATUS: DNR  PPS: 50%  HOSPICE ELIGIBILITY/DIAGNOSIS: TBD  ROS/patient Constitutional: denies fever, denies chills, denies fatigue EYES: endorse poor vision ENMT: denies dysphagia Cardiovascular: denies chest pain, denies palpitation Pulmonary: denies acute cough, denies increased SOB Abdomen: endorses fair appetite, denies constipation, denies incontinence of bowel GU: denies dysuria, denies incontinence of urine MSK: endorses ROM limitations, no  falls reported Skin: denies rashes or wounds Neurological: endorses weakness, denies uncontrolled pain,  denies insomnia Psych: Endorses positive mood Heme/lymph/immuno: denies bruises, abnormal bleeding   Physical Exam: Vital signs: BP 120/70, P 57, RR 16, oxygen saturation 95% on room air. Current and past weights: 130.5lbs down from 135lbs a month ago, Ht 22f3", BMI 23kg/m2 General: frail appearing, cooperative, NAD EYES: anicteric sclera, lids intact, no discharge  ENMT: intact hearing, oral mucous membranes moist CV:  no LE edema Pulmonary: no increased work of breathing, no cough, no audible wheezes, room air Abdomen: no ascites GU: deferred MSK:  no contractures of LE Skin: warm and dry, no rashes or wounds on visible skin Neuro: Generalized weakness, A and O x 3 Psych: non-anxious affect today Hem/lymph/immuno: no widespread bruising   PAST MEDICAL HISTORY:  Past Medical History:  Diagnosis Date  . Anemia   . Aortic stenosis    a. Echo 09/06/12 EF 55-60%, no WMA, G2DD, Ao valve sclerosis w/ mod stenosis, LA mildly dilated, PA pressure 434mg  . Asthmatic bronchitis   . [facility]   s/p permanent pacemaker 06/27/1999 (Battery change 06/2007 and 2016).  s/p AV nodal ablation by Dr AlRayann Heman016.  . Marland KitchenOPD (chronic obstructive pulmonary disease) (HCC)    Dr. YoAnnamaria Boots. DDD (degenerative disc disease)   . Diastolic dysfunction    a. Echo 09/06/12 EF 55-60%, no WMA, G2DD, Ao valve sclerosis w/ mod stenosis, LA mildly dilated, PA pressure 4152m  . Diverticulosis   . DVT (deep vein thrombosis) in pregnancy 1954   a. LLE  . Hypertension   . Hypothyroidism    on medication  . Macular degeneration    Dr. KurEliezer Bottom Osteoarthrosis, unspecified whether generalized or localized, other specified sites   . PAF (paroxysmal atrial fibrillation) (HCC)    Stopped flecainide, on amiodarone but still has bouts of A FIB (mostly in mornings).   . Pure hypercholesterolemia   .  Staghorn calculus    Left    SOCIAL HX:  Social History   Tobacco Use  . Smoking status: Never Smoker  . Smokeless tobacco: Never Used  Substance Use Topics  . Alcohol use: Yes    Alcohol/week: 7.0 standard drinks    Types: 7 Glasses of wine per week    Comment: occasionally   FAMILY HX:  Family History  Problem Relation Age of Onset  . Malignant hypertension Father   . Hypertension Father   . Renal Disease Father   . Breast cancer Mother   . Heart attack Brother   . Stroke Brother     ALLERGIES:  Allergies  Allergen Reactions  . Brimonidine Tartrate-Timolol Other (See Comments)    REACTION: systemic malaise amigen eye drop  . Penicillins Hives  . Breo Ellipta [Fluticasone Furoate-Vilanterol] Other (See Comments)    Ran blood pressure up Increased blood pressure     PERTINENT MEDICATIONS:  Outpatient Encounter Medications as of 06/23/2020  Medication Sig  . amLODipine (NORVASC) 5 MG tablet TAKE 1 TABLET BY MOUTH  DAILY  . azelastine (ASTELIN) 0.1 % nasal spray USE 1 TO 2 SPRAYS INTO BOTH NOSTRILS TWICE DAILY.  . cephALEXin (KEFLEX) 500 MG capsule Take 1 capsule (500 mg total) by mouth in the morning and at bedtime.  . Cholecalciferol (VITAMIN D) 2000 units CAPS Take 1 capsule by mouth daily.  . fluticasone (FLONASE) 50 MCG/ACT nasal spray USE 2 SPRAYS EACH NOSTRIL ONCE A DAY AS NEEDED FOR ALLERGIES OR CONGESTION.  . furosemide (LASIX) 40 MG tablet Take 1.5 tablets (60 mg total)  by mouth daily. Ok to use an extra dose of 40 mg for weight gain  . gabapentin (NEURONTIN) 100 MG capsule TAKE 1 CAPSULE BY MOUTH AT  BEDTIME  . latanoprost (XALATAN) 0.005 % ophthalmic solution Place 1 drop into both eyes at bedtime.  [name] levothyroxine (SYNTHROID) 88 MCG tablet TAKE 1 TABLET BY MOUTH  DAILY BEFORE BREAKFAST  . losartan (COZAAR) 100 MG tablet Take 100 mg by mouth 2 (two) times daily. 50 at noon and a 100 @ night  . LUTEIN PO Take 25 mg by mouth daily.   . Magnesium 250 MG TABS  Take 1 tablet by mouth daily.  . metoprolol succinate (TOPROL-XL) 100 MG 24 hr tablet Take 100 mg by mouth daily.  . montelukast (SINGULAIR) 10 MG tablet TAKE 1 TABLET BY MOUTH  DAILY  . Multiple Vitamins-Minerals (PRESERVISION AREDS 2) CAPS Take 1 capsule by mouth in the morning and at bedtime.   . potassium chloride SA (KLOR-CON) 20 MEQ tablet Take 20 mEq by mouth daily.  . predniSONE (DELTASONE) 5 MG tablet Take 1 tablet (5 mg total) by mouth daily with breakfast.  . Rivaroxaban (XARELTO) 15 MG TABS tablet TAKE 1 TABLET BY MOUTH  DAILY WITH SUPPER  . SYMBICORT 160-4.5 MCG/ACT inhaler INHALE 2 PUFFS BY MOUTH  INTO THE LUNGS TWO TIMES  DAILY  . VENTOLIN HFA 108 (90 Base) MCG/ACT inhaler USE 2 PUFFS EVERY 6 HOURS AS NEEDED FOR SHORTNESS OF BREATH AND WHEEZING.   No facility-administered encounter medications on file as of 06/23/2020.    Thank you for the opportunity to participate in the care of Ms. Yanet Saputo. The palliative care team will continue to follow. Please call our office at [phone] if we can be of additional assistance.  Jari Favre, DNP, AGPCNP-BC

## 2020-07-14 ENCOUNTER — Other Ambulatory Visit: Payer: Self-pay

## 2020-07-14 MED ORDER — GABAPENTIN 100 MG PO CAPS: 100.0000 mg | ORAL_CAPSULE | Freq: Every day | ORAL | 3 refills | Status: DC

## 2020-07-14 NOTE — Telephone Encounter (Signed)
[facility] pharmacy has sent medication request for Gabapentin. Patient states she will switch to Dr.Gupta due to Dr.Reed being no longer with [facility] practice.

## 2020-07-28 DIAGNOSIS — D3132 Benign neoplasm of left choroid: Secondary | ICD-10-CM | POA: Diagnosis not present

## 2020-07-28 DIAGNOSIS — H353133 Nonexudative age-related macular degeneration, bilateral, advanced atrophic without subfoveal involvement: Secondary | ICD-10-CM | POA: Diagnosis not present

## 2020-07-28 DIAGNOSIS — Z961 Presence of intraocular lens: Secondary | ICD-10-CM | POA: Diagnosis not present

## 2020-07-28 DIAGNOSIS — H401132 Primary open-angle glaucoma, bilateral, moderate stage: Secondary | ICD-10-CM | POA: Diagnosis not present

## 2020-08-11 ENCOUNTER — Other Ambulatory Visit: Payer: Self-pay

## 2020-08-11 ENCOUNTER — Other Ambulatory Visit: Payer: Medicare Other | Admitting: Nurse Practitioner

## 2020-08-11 VITALS — BP 110/70 | HR 75 | Resp 18 | Wt 130.8 lb

## 2020-08-11 DIAGNOSIS — H547 Unspecified visual loss: Secondary | ICD-10-CM

## 2020-08-11 DIAGNOSIS — Z515 Encounter for palliative care: Secondary | ICD-10-CM | POA: Diagnosis not present

## 2020-08-11 NOTE — Progress Notes (Signed)
[facility] Consult Note Telephone: [phone]  Fax: [phone]    Date of encounter: 08/11/20 PATIENT NAME: Jody Blake [address] [place] [address]   [phone] (home)  DOB: 1925/07/09 MRN: [id number]  PRIMARY CARE PROVIDER:    [name], [place],  Jody Blake [id number] [phone]  REFERRING PROVIDER:   Dr. Hollace Blake  RESPONSIBLE PARTY:    Contact Information    Name Relation Home Work Mobile   Jody Blake [phone]  [phone]   Jody Blake Daughter   [phone]   Jody Blake Relative [phone]  [phone]     I met face to face with patient in home. Palliative Care was asked to follow this patient by consultation request of  Jody Blake to address advance care planning and complex medical decision making. This is a follow up visit.                                  ASSESSMENT AND PLAN / RECOMMENDATIONS:   Advance Care Planning/Goals of Care:  CODE STATUS: DNR Goal of care: Patient's goal of care is comfort while preserving function.  Directives: Signed DNR and MOST forms present in home, copies on [place] EMR. Details of MOST include comfort measures, antibiotics if indicated, IV fluids for a defined trial period, no feeding tube.   Symptom Management/Plan: Poor vision: continue follow up with ophthalmology. Continue eye drops as prescribed. Fall precautions discussed.   Shortness of breath on exertion: report condition is improved. Report taking a walk around her block today and tolerated activity well without being out of breath. Report not having need to use PRN Albuterol today. She has a visit scheduled with her new PCP next week, endorsed looking forward to meeting her new doctor as her former PCP left the practice.  Questions and concerns were addressed. Patient was encouraged to call with questions and/or concerns. Provided general  support and encouragement, no other unmet needs identified. Palliative care will continue to provide support to patient, family and medical team.  Follow up Palliative Care Visit: Palliative care will continue to follow for complex medical decision making, advance care planning, and clarification of goals. Return in about 6 weeks or prn.  PPS: 50%  HOSPICE ELIGIBILITY/DIAGNOSIS: TBD  CHIEF COMPLAIN: worsening vision  History obtained from review of EMR, discussion with primary team, and interview with family, facility staff/caregiver and/or Jody Blake.   HISTORY OF PRESENT ILLNESS:Jody K Snideris a 87 y.o.year old femalewith multiple medical problems including COPD with frequent exacerbations, Afib s/p pacemaker (on Xarelto), aortic stenosis, degenerative disease, HTN, Glaucoma/macular degeneration, CKD 3, Osteoarthritis, hx of TIA. Patient report worsening of her vision in the context of progressing Macular degeneration. She is followed by Jody Blake, was seen last week and started on Dorzolamide ophthalmic drops twice a day in addition to Xalantan drops at bed time. She report coping with her poor vision because her environment is familiar. Patient denied depression, denied eye pain, denied any recent falls. She denied fever, denied chills, or sob. Ten systems reviewed and are negative for acute change, except as noted in the HPI.  I reviewed available labs, medications, imaging, studies and related documents from the EMR.  Records reviewed and summarized above.   Physical Exam: Current and past weights: 130.8lbs stable form last month. General: frail appearing, cooperative, NAD EYES:  anicteric sclera, no discharge  ENMT: hard of hearing, oral mucous membranes moist CV: no LE edema Pulmonary: no increased work of breathing, no cough, no audible wheezes, room air Abdomen:no ascites GU: deferred MSK: moves all extremities, no contractures Skin: warm and dry, no rashes or  wounds on visible skin Neuro: Generalized weakness, A and O x 3 Psych: non-anxious affecttoday Hem/lymph/immuno: no widespread bruising  Past Medical History:  Diagnosis Date  . Anemia   . Aortic stenosis    a. Echo 09/06/12 EF 55-60%, no WMA, G2DD, Ao valve sclerosis w/ mod stenosis, LA mildly dilated, PA pressure 66mHg  . Asthmatic bronchitis   . Complete heart block [facility]    s/p permanent pacemaker 06/27/1999 (Battery change 06/2007 and 2016).  s/p AV nodal ablation by Dr ARayann Heman2016.  .Marland KitchenCOPD (chronic obstructive pulmonary disease) (HCC)    Jody Blake . DDD (degenerative disc disease)   . Diastolic dysfunction    a. Echo 09/06/12 EF 55-60%, no WMA, G2DD, Ao valve sclerosis w/ mod stenosis, LA mildly dilated, PA pressure 486mg  . Diverticulosis   . DVT (deep vein thrombosis) in pregnancy 1954   a. LLE  . Hypertension   . Hypothyroidism    on medication  . Macular degeneration    Jody Blake. Osteoarthrosis, unspecified whether generalized or localized, other specified sites   . PAF (paroxysmal atrial fibrillation) (HCC)    Stopped flecainide, on amiodarone but still has bouts of A FIB (mostly in mornings).   . Pure hypercholesterolemia   . Staghorn calculus    Left   Current Outpatient Medications on File Prior to Visit  Medication Sig Dispense Refill  . amLODipine (NORVASC) 5 MG tablet TAKE 1 TABLET BY MOUTH  DAILY 90 tablet 3  . azelastine (ASTELIN) 0.1 % nasal spray USE 1 TO 2 SPRAYS INTO BOTH NOSTRILS TWICE DAILY. 30 mL PRN  . cephALEXin (KEFLEX) 500 MG capsule Take 1 capsule (500 mg total) by mouth in the morning and at bedtime. 14 capsule 0  . Cholecalciferol (VITAMIN D) 2000 units CAPS Take 1 capsule by mouth daily.    . fluticasone (FLONASE) 50 MCG/ACT nasal spray USE 2 SPRAYS EACH NOSTRIL ONCE A DAY AS NEEDED FOR ALLERGIES OR CONGESTION. 16 g PRN  . furosemide (LASIX) 40 MG tablet Take 1.5 tablets (60 mg total) by mouth daily. Ok to use an extra dose of 40 mg for weight  gain 90 tablet 1  . gabapentin (NEURONTIN) 100 MG capsule Take 1 capsule (100 mg total) by mouth at bedtime. 90 capsule 3  . latanoprost (XALATAN) 0.005 % ophthalmic solution Place 1 drop into both eyes at bedtime.    . Marland Kitchenevothyroxine (SYNTHROID) 88 MCG tablet TAKE 1 TABLET BY MOUTH  DAILY BEFORE BREAKFAST 90 tablet 3  . losartan (COZAAR) 100 MG tablet Take 100 mg by mouth 2 (two) times daily. 50 at noon and a 100 @ night    . LUTEIN PO Take 25 mg by mouth daily.     . Magnesium 250 MG TABS Take 1 tablet by mouth daily.    . metoprolol succinate (TOPROL-XL) 100 MG 24 hr tablet Take 100 mg by mouth daily.    . montelukast (SINGULAIR) 10 MG tablet TAKE 1 TABLET BY MOUTH  DAILY 90 tablet 3  . Multiple Vitamins-Minerals (PRESERVISION AREDS 2) CAPS Take 1 capsule by mouth in the morning and at bedtime.     . potassium chloride SA (KLOR-CON) 20 MEQ tablet Take 20  mEq by mouth daily.    . predniSONE (DELTASONE) 5 MG tablet Take 1 tablet (5 mg total) by mouth daily with breakfast. 90 tablet 1  . Rivaroxaban (XARELTO) 15 MG TABS tablet TAKE 1 TABLET BY MOUTH  DAILY WITH SUPPER 10 tablet 1  . SYMBICORT 160-4.5 MCG/ACT inhaler INHALE 2 PUFFS BY MOUTH  INTO THE LUNGS TWO TIMES  DAILY 30.6 g 3  . VENTOLIN HFA 108 (90 Base) MCG/ACT inhaler USE 2 PUFFS EVERY 6 HOURS AS NEEDED FOR SHORTNESS OF BREATH AND WHEEZING. 18 g 0   No current facility-administered medications on file prior to visit.   I spent 46 minutes providing this consultation, time includes time spent with patient/family, chart review, provider coordination, and documentation. More than 50% of the time in this consultation was spent counseling and coordinating communication.   Thank you for the opportunity to participate in the care of Ms. Thoma.  The palliative care team will continue to follow. Please call our office at [phone] if we can be of additional assistance.   Jari Favre, DNP,AGPCNP-BC  COVID-19 PATIENT SCREENING TOOL Asked  and negative response unless otherwise noted:   Have you had symptoms of covid, tested positive or been in contact with someone with symptoms/positive test in the past 5-10 days?

## 2020-08-16 ENCOUNTER — Other Ambulatory Visit: Payer: Self-pay

## 2020-08-16 ENCOUNTER — Non-Acute Institutional Stay: Payer: Medicare Other | Admitting: Internal Medicine

## 2020-08-16 ENCOUNTER — Encounter: Payer: Self-pay | Admitting: Internal Medicine

## 2020-08-16 VITALS — BP 116/66 | HR 86 | Temp 96.5°F | Ht 63.0 in | Wt 131.4 lb

## 2020-08-16 DIAGNOSIS — I5032 Chronic diastolic (congestive) heart failure: Secondary | ICD-10-CM

## 2020-08-16 DIAGNOSIS — I10 Essential (primary) hypertension: Secondary | ICD-10-CM | POA: Diagnosis not present

## 2020-08-16 DIAGNOSIS — I872 Venous insufficiency (chronic) (peripheral): Secondary | ICD-10-CM

## 2020-08-16 DIAGNOSIS — J449 Chronic obstructive pulmonary disease, unspecified: Secondary | ICD-10-CM

## 2020-08-16 DIAGNOSIS — H547 Unspecified visual loss: Secondary | ICD-10-CM

## 2020-08-16 DIAGNOSIS — E7800 Pure hypercholesterolemia, unspecified: Secondary | ICD-10-CM

## 2020-08-16 DIAGNOSIS — I4821 Permanent atrial fibrillation: Secondary | ICD-10-CM

## 2020-08-16 NOTE — Progress Notes (Signed)
Location:  [place] of Service:  Clinic (12)  Provider:   Code Status: DNR Goals of Care:  Advanced Directives 05/17/2020  Does Patient Have a Medical Advance Directive? Yes  Type of Advance Directive Out of facility DNR (pink MOST or yellow form)  Does patient want to make changes to medical advance directive? No - Patient declined  Copy of [place] in Chart? -  Would patient like information on creating a medical advance directive? -  Pre-existing out of facility DNR order (yellow form or pink MOST form) Pink MOST/Yellow Form most recent copy in chart - Physician notified to receive inpatient order     Chief Complaint  Patient presents with  . Medical Management of Chronic Issues    Patient returns to the clinic for her 4 month follow up. She has not had a recent bone scan. She has no concerns other than her hearing and vision.     HPI: Patient is a 87 y.o. female seen today for medical management of chronic diseases.    Patient has a history of A. fib on Xarelto s/p AV nodal ablation, s/p PPM in 2009 Aortic stenosis  COPD with frequent exacerbations.  Not on oxygen.  On chronic prednisone.  Follows closely with Dr. [name].  Is enrolled in palliative care Chronic diastolic CHF on high doses of Lasix History of chronic venous insufficiency Vision loss due to macular degeneration and glaucoma  Doing well at home.  Is pretty independent.  Does not have any caregivers.  Is able to walk without any assist.  [place]. Her weight is stable.  Appetite is good.  Does have chronic cough with clear sputum.  Specially at night when she lays down.   Past Medical History:  Diagnosis Date  . Anemia   . Aortic stenosis    a. Echo 09/06/12 EF 55-60%, no WMA, G2DD, Ao valve sclerosis w/ mod stenosis, LA mildly dilated, PA pressure 13mmHg  . Asthmatic bronchitis   . Complete heart block  [facility])    s/p permanent pacemaker 06/27/1999 (Battery change 06/2007 and 2016).  s/p AV nodal ablation by Dr [name] 2016.  [name] COPD (chronic obstructive pulmonary disease) (HCC)    Dr. [name]  . DDD (degenerative disc disease)   . Diastolic dysfunction    a. Echo 09/06/12 EF 55-60%, no WMA, G2DD, Ao valve sclerosis w/ mod stenosis, LA mildly dilated, PA pressure 84mmHg  . Diverticulosis   . DVT (deep vein thrombosis) in pregnancy 1954   a. LLE  . Hypertension   . Hypothyroidism    on medication  . Macular degeneration    Dr. [name]  . Osteoarthrosis, unspecified whether generalized or localized, other specified sites   . PAF (paroxysmal atrial fibrillation) (HCC)    Stopped flecainide, on amiodarone but still has bouts of A FIB (mostly in mornings).   . Pure hypercholesterolemia   . Staghorn calculus    Left    Past Surgical History:  Procedure Laterality Date  . APPENDECTOMY  ~ 1941  . AV NODE ABLATION  05/10/2014  . AV NODE ABLATION N/A 05/10/2014   Procedure: AV NODE ABLATION;  Surgeon: Thompson Grayer, MD;  Location: [facility] CATH LAB;  Service: Cardiovascular;  Laterality: N/A;  . BUNIONECTOMY WITH HAMMERTOE RECONSTRUCTION Bilateral ~ 1990  . CARDIAC PACEMAKER PLACEMENT  06/27/99   Medtronic PM implanted by Dr [name]  . CARDIOVERSION N/A 12/02/2013  Procedure: CARDIOVERSION;  Surgeon: [name], MD;  Location: [facility] ENDOSCOPY;  Service: Cardiovascular;  Laterality: N/A;  . CATARACT EXTRACTION W/ INTRAOCULAR LENS  IMPLANT, BILATERAL Bilateral   . COLONOSCOPY WITH PROPOFOL N/A 04/22/2013   Procedure: COLONOSCOPY WITH PROPOFOL;  Surgeon: [name], MD;  Location: [facility] ENDOSCOPY;  Service: Endoscopy;  Laterality: N/A;  . DILATION AND CURETTAGE OF UTERUS  X 2   "when I was going thru menopause"  . ESOPHAGOGASTRODUODENOSCOPY (EGD) WITH PROPOFOL N/A 04/22/2013   Procedure: ESOPHAGOGASTRODUODENOSCOPY (EGD) WITH PROPOFOL;  Surgeon: [name], MD;  Location: [facility] ENDOSCOPY;  Service: Endoscopy;   Laterality: N/A;  . HERNIA REPAIR    . INCISIONAL HERNIA REPAIR    . INSERT / REPLACE / REMOVE PACEMAKER  06/2007   "took out the old; put in new"  . INSERT / REPLACE / REMOVE PACEMAKER  05/10/2014   MDT PPM generator change by Dr [name]  . JOINT REPLACEMENT    . PARTIAL NEPHRECTOMY Left 05/1974   stone disease  . PERMANENT PACEMAKER GENERATOR CHANGE N/A 05/10/2014   Procedure: PERMANENT PACEMAKER GENERATOR CHANGE;  Surgeon: Thompson Grayer, MD;  Location: [facility] CATH LAB;  Service: Cardiovascular;  Laterality: N/A;  . TONSILLECTOMY AND ADENOIDECTOMY  1930's  . TOTAL KNEE ARTHROPLASTY Right 2001    Allergies  Allergen Reactions  . Brimonidine Tartrate-Timolol Other (See Comments)    REACTION: systemic malaise amigen eye drop  . Penicillins Hives  . Breo Ellipta [Fluticasone Furoate-Vilanterol] Other (See Comments)    Ran blood pressure up Increased blood pressure    Outpatient Encounter Medications as of 08/16/2020  Medication Sig  . amLODipine (NORVASC) 5 MG tablet TAKE 1 TABLET BY MOUTH  DAILY  . azelastine (ASTELIN) 0.1 % nasal spray USE 1 TO 2 SPRAYS INTO BOTH NOSTRILS TWICE DAILY.  . cephALEXin (KEFLEX) 500 MG capsule Take 1 capsule (500 mg total) by mouth in the morning and at bedtime.  . Cholecalciferol (VITAMIN D) 2000 units CAPS Take 1 capsule by mouth daily.  . fluticasone (FLONASE) 50 MCG/ACT nasal spray USE 2 SPRAYS EACH NOSTRIL ONCE A DAY AS NEEDED FOR ALLERGIES OR CONGESTION.  . furosemide (LASIX) 40 MG tablet Take 1.5 tablets (60 mg total) by mouth daily. Ok to use an extra dose of 40 mg for weight gain  . gabapentin (NEURONTIN) 100 MG capsule Take 1 capsule (100 mg total) by mouth at bedtime.  [name] latanoprost (XALATAN) 0.005 % ophthalmic solution Place 1 drop into both eyes at bedtime.  [name] levothyroxine (SYNTHROID) 88 MCG tablet TAKE 1 TABLET BY MOUTH  DAILY BEFORE BREAKFAST  . losartan (COZAAR) 100 MG tablet Take 100 mg by mouth 2 (two) times daily. 50 at noon and a 100 @ night   . LUTEIN PO Take 25 mg by mouth daily.   . Magnesium 250 MG TABS Take 1 tablet by mouth daily.  . metoprolol succinate (TOPROL-XL) 100 MG 24 hr tablet Take 100 mg by mouth daily.  . montelukast (SINGULAIR) 10 MG tablet TAKE 1 TABLET BY MOUTH  DAILY  . Multiple Vitamins-Minerals (PRESERVISION AREDS 2) CAPS Take 1 capsule by mouth in the morning and at bedtime.   . potassium chloride SA (KLOR-CON) 20 MEQ tablet Take 20 mEq by mouth daily.  . predniSONE (DELTASONE) 5 MG tablet Take 1 tablet (5 mg total) by mouth daily with breakfast.  . Rivaroxaban (XARELTO) 15 MG TABS tablet TAKE 1 TABLET BY MOUTH  DAILY WITH SUPPER  . SYMBICORT 160-4.5 MCG/ACT inhaler INHALE  2 PUFFS BY MOUTH  INTO THE LUNGS TWO TIMES  DAILY  . VENTOLIN HFA 108 (90 Base) MCG/ACT inhaler USE 2 PUFFS EVERY 6 HOURS AS NEEDED FOR SHORTNESS OF BREATH AND WHEEZING.   No facility-administered encounter medications on file as of 08/16/2020.    Review of Systems:  Review of Systems  Review of Systems  Constitutional: Negative for activity change, appetite change, chills, diaphoresis, fatigue and fever.  HENT: Negative for mouth sores, postnasal drip, rhinorrhea, sinus pain and sore throat.   Respiratory: Negative for apnea, chest tightness, shortness of breath and wheezing.   Cardiovascular: Negative for chest pain, palpitations and leg swelling.  Gastrointestinal: Negative for abdominal distention, abdominal pain, constipation, diarrhea, nausea and vomiting.  Genitourinary: Negative for dysuria and frequency.  Musculoskeletal: Negative for arthralgias, joint swelling and myalgias.  Skin: Negative for rash.  Neurological: Negative for dizziness, syncope, weakness, light-headedness and numbness.  Psychiatric/Behavioral: Negative for behavioral problems, confusion and sleep disturbance.     Health Maintenance  Topic Date Due  . DEXA SCAN  Never done  . INFLUENZA VACCINE  11/06/2020  . TETANUS/TDAP  02/23/2029  . COVID-19  Vaccine  Completed  . PNA vac Low Risk Adult  Completed  . HPV VACCINES  Aged Out    Physical Exam: Vitals:   08/16/20 1023  BP: 116/66  Pulse: 86  Temp: (!) 96.5 F (35.8 C)  SpO2: 98%  Weight: 131 lb 6.4 oz (59.6 kg)  Height: 5\' 3"  (1.6 m)   Body mass index is 23.28 kg/m. Physical Exam  Constitutional: Oriented to person, place, and time. Well-developed and well-nourished.  HENT:  Head: Normocephalic.  Mouth/Throat: Oropharynx is clear and moist.  Eyes: Pupils are equal, round, and reactive to light.  Neck: Neck supple.  Cardiovascular: Normal rate and normal heart sounds.  Murmur Present Pulmonary/Chest: Effort normal and breath sounds normal. No respiratory distress. No wheezes. She has no rales.  Abdominal: Soft. Bowel sounds are normal. No distension. There is no tenderness. There is no rebound.  Musculoskeletal:Mild Edema Bilateral. With Chronic Venous Changes. Feeble DP Lymphadenopathy: none Neurological: Alert and oriented to person, place, and time. Walks well Gait Stable Skin: Skin is warm and dry.  Psychiatric: Normal mood and affect. Behavior is normal. Thought content normal.    Labs reviewed: Basic Metabolic Panel: Recent Labs    09/20/19 1622 10/27/19 1928 10/28/19 0028  NA 140 142 139  K 4.3 3.5 3.6  CL 99 100 100  CO2 27 29 25   GLUCOSE 101* 102* 136*  BUN 19 21 19   CREATININE 1.20* 0.91 0.96  CALCIUM 9.7 9.4 9.3  MG  --  2.5*  --   PHOS  --  3.0  --    Liver Function Tests: Recent Labs    10/27/19 1928  AST 35  ALT 26  ALKPHOS 57  BILITOT 1.0  PROT 7.1  ALBUMIN 4.3   No results for input(s): LIPASE, AMYLASE in the last 8760 hours. No results for input(s): AMMONIA in the last 8760 hours. CBC: Recent Labs    10/27/19 1928 10/28/19 0028  WBC 4.9 4.6  NEUTROABS 3.3 4.0  HGB 13.1 13.5  HCT 40.8 42.3  MCV 99.0 100.0  PLT 229 225   Lipid Panel: No results for input(s): CHOL, HDL, LDLCALC, TRIG, CHOLHDL, LDLDIRECT in the last  8760 hours. Lab Results  Component Value Date   HGBA1C 5.7 (H) 06/25/2017    Procedures since last visit: No results found.  Assessment/Plan Essential hypertension  BP running on Lower side Will decrase the Norvasc to 2.5 mg Can eventually taper her off it  Chronic diastolic congestive heart failure (HCC) Well Compensated on High Doses of Lasix  Permanent atrial fibrillation (HCC) On Metoprolol and Xarelto Follows with Cardiology  COPD mixed type ([place]) On Symbicort and Proair Also on Chronic Prednisone Since she is c/o Cough at night will add Pepcid 40 mg QD  Chronic venous insufficiency Continue to elevate Legs Poor vision Staying Independent right now  Discussed with Facility Nurse for her to get 4th Booster Shot   Labs/tests ordered:  [facility] Next appt:  Visit date not found

## 2020-08-17 ENCOUNTER — Telehealth: Payer: Self-pay | Admitting: *Deleted

## 2020-08-17 MED ORDER — FAMOTIDINE 40 MG PO TABS: 40.0000 mg | ORAL_TABLET | Freq: Every day | ORAL | 3 refills | Status: DC

## 2020-08-17 MED ORDER — AMLODIPINE BESYLATE 2.5 MG PO TABS: 2.5000 mg | ORAL_TABLET | Freq: Every day | ORAL | 3 refills | Status: DC

## 2020-08-17 NOTE — Telephone Encounter (Signed)
Heather, Nurse with [facility], called and stated that you were going to change patient's medication and send it to [facility].  Stated that [facility] has not received any changes yet.  Calling to check the status of this.  

## 2020-08-17 NOTE — Telephone Encounter (Signed)
I have done it now and told Heather. Thanks

## 2020-08-18 ENCOUNTER — Ambulatory Visit: Payer: Medicare Other | Attending: Internal Medicine

## 2020-08-18 ENCOUNTER — Other Ambulatory Visit (HOSPITAL_BASED_OUTPATIENT_CLINIC_OR_DEPARTMENT_OTHER): Payer: Self-pay

## 2020-08-18 ENCOUNTER — Other Ambulatory Visit: Payer: Self-pay

## 2020-08-18 DIAGNOSIS — Z23 Encounter for immunization: Secondary | ICD-10-CM

## 2020-08-18 MED ORDER — COVID-19 MRNA VACC (MODERNA) 100 MCG/0.5ML IM SUSP
INTRAMUSCULAR | 0 refills | Status: DC
  Filled 2020-08-18: qty 0.25, 1d supply, fill #0

## 2020-08-18 NOTE — Progress Notes (Signed)
   Covid-19 Vaccination Clinic  Name:  Jody Blake    MRN: [id number] DOB: [birth year]  08/18/2020  [name] was observed post Covid-19 immunization for 15 minutes without incident. She was provided with Vaccine Information Sheet and instruction to access the V-Safe system.   [name] was instructed to call 911 with any severe reactions post vaccine: [name] Difficulty breathing  . Swelling of face and throat  . A fast heartbeat  . A bad rash all over body  . Dizziness and weakness   Immunizations Administered    Name Date Dose VIS Date Route   Moderna Covid-19 Booster Vaccine 08/18/2020  1:39 PM 0.25 mL 01/26/2020 Intramuscular   Manufacturer: Moderna   Lot: [id number]   [place]: [id number]

## 2020-08-21 ENCOUNTER — Non-Acute Institutional Stay: Payer: Medicare Other | Admitting: Internal Medicine

## 2020-08-21 ENCOUNTER — Other Ambulatory Visit: Payer: Self-pay

## 2020-08-21 ENCOUNTER — Encounter: Payer: Self-pay | Admitting: Internal Medicine

## 2020-08-21 VITALS — BP 134/86 | HR 83 | Temp 96.4°F | Ht 63.0 in | Wt 133.8 lb

## 2020-08-21 DIAGNOSIS — I872 Venous insufficiency (chronic) (peripheral): Secondary | ICD-10-CM

## 2020-08-21 DIAGNOSIS — R635 Abnormal weight gain: Secondary | ICD-10-CM

## 2020-08-21 DIAGNOSIS — I10 Essential (primary) hypertension: Secondary | ICD-10-CM

## 2020-08-21 DIAGNOSIS — E7800 Pure hypercholesterolemia, unspecified: Secondary | ICD-10-CM

## 2020-08-21 DIAGNOSIS — I4821 Permanent atrial fibrillation: Secondary | ICD-10-CM

## 2020-08-21 DIAGNOSIS — J449 Chronic obstructive pulmonary disease, unspecified: Secondary | ICD-10-CM | POA: Diagnosis not present

## 2020-08-21 DIAGNOSIS — R600 Localized edema: Secondary | ICD-10-CM | POA: Diagnosis not present

## 2020-08-21 DIAGNOSIS — I5032 Chronic diastolic (congestive) heart failure: Secondary | ICD-10-CM | POA: Diagnosis not present

## 2020-08-21 MED ORDER — FUROSEMIDE 80 MG PO TABS: 80.0000 mg | ORAL_TABLET | Freq: Every day | ORAL | 3 refills | Status: DC

## 2020-08-21 MED ORDER — POTASSIUM CHLORIDE CRYS ER 20 MEQ PO TBCR: 20.0000 meq | EXTENDED_RELEASE_TABLET | Freq: Two times a day (BID) | ORAL | 3 refills | Status: DC

## 2020-08-21 NOTE — Progress Notes (Signed)
Location: [place] of Service:  Clinic (12)  Provider:   Code Status: DNR Goals of Care:  Advanced Directives 05/17/2020  Does Patient Have a Medical Advance Directive? Yes  Type of Advance Directive Out of facility DNR (pink MOST or yellow form)  Does patient want to make changes to medical advance directive? No - Patient declined  Copy of [place] in Chart? -  Would patient like information on creating a medical advance directive? -  Pre-existing out of facility DNR order (yellow form or pink MOST form) Pink MOST/Yellow Form most recent copy in chart - Physician notified to receive inpatient order     Chief Complaint  Patient presents with  . Acute Visit    Complains of fluid weeping from right leg    HPI: Patient is a 87 y.o. female seen today for an acute visit for Discharge from Right Leg  Patient has a history of A. fib on Xarelto s/p AV nodal ablation, s/p PPM in 2009 Aortic stenosis  COPD with frequent exacerbations.  Not on oxygen.  On chronic prednisone.  Follows closely with Dr. [name].  Is enrolled in palliative care Chronic diastolic CHF on high doses of Lasix History of chronic venous insufficiency Vision loss due to macular degeneration and glaucoma  Seen today in Clinic for Right Leg Swelling and discharge. It started few days ago Her Socks got wet. Facility Nurse has put Foam Dressing. The discharge is clear no Odor No Redness not tender. She is not Fever No SOB or PND Has gained 2 lbs since last visit   Past Medical History:  Diagnosis Date  . Anemia   . Aortic stenosis    a. Echo 09/06/12 EF 55-60%, no WMA, G2DD, Ao valve sclerosis w/ mod stenosis, LA mildly dilated, PA pressure 58mmHg  . Asthmatic bronchitis   . Complete heart block [facility])    s/p permanent pacemaker 06/27/1999 (Battery change 06/2007 and 2016).  s/p AV nodal ablation by Dr [name] 2016.  [name] COPD (chronic obstructive pulmonary disease)  (HCC)    Dr. [name]  . DDD (degenerative disc disease)   . Diastolic dysfunction    a. Echo 09/06/12 EF 55-60%, no WMA, G2DD, Ao valve sclerosis w/ mod stenosis, LA mildly dilated, PA pressure 37mmHg  . Diverticulosis   . DVT (deep vein thrombosis) in pregnancy 1954   a. LLE  . Hypertension   . Hypothyroidism    on medication  . Macular degeneration    Dr. [name]  . Osteoarthrosis, unspecified whether generalized or localized, other specified sites   . PAF (paroxysmal atrial fibrillation) (HCC)    Stopped flecainide, on amiodarone but still has bouts of A FIB (mostly in mornings).   . Pure hypercholesterolemia   . Staghorn calculus    Left    Past Surgical History:  Procedure Laterality Date  . APPENDECTOMY  ~ 1941  . AV NODE ABLATION  05/10/2014  . AV NODE ABLATION N/A 05/10/2014   Procedure: AV NODE ABLATION;  Surgeon: Thompson Grayer, MD;  Location: [facility] CATH LAB;  Service: Cardiovascular;  Laterality: N/A;  . BUNIONECTOMY WITH HAMMERTOE RECONSTRUCTION Bilateral ~ 1990  . CARDIAC PACEMAKER PLACEMENT  06/27/99   Medtronic PM implanted by Dr [name]  . CARDIOVERSION N/A 12/02/2013   Procedure: CARDIOVERSION;  Surgeon: [name], MD;  Location: [place];  Service: Cardiovascular;  Laterality: N/A;  . CATARACT EXTRACTION W/ INTRAOCULAR LENS  IMPLANT, BILATERAL Bilateral   . COLONOSCOPY  WITH PROPOFOL N/A 04/22/2013   Procedure: COLONOSCOPY WITH PROPOFOL;  Surgeon: [name], MD;  Location: [facility] ENDOSCOPY;  Service: Endoscopy;  Laterality: N/A;  . DILATION AND CURETTAGE OF UTERUS  X 2   "when I was going thru menopause"  . ESOPHAGOGASTRODUODENOSCOPY (EGD) WITH PROPOFOL N/A 04/22/2013   Procedure: ESOPHAGOGASTRODUODENOSCOPY (EGD) WITH PROPOFOL;  Surgeon: [name], MD;  Location: [facility] ENDOSCOPY;  Service: Endoscopy;  Laterality: N/A;  . HERNIA REPAIR    . INCISIONAL HERNIA REPAIR    . INSERT / REPLACE / REMOVE PACEMAKER  06/2007   "took out the old; put in new"  . INSERT /  REPLACE / REMOVE PACEMAKER  05/10/2014   MDT PPM generator change by Dr [name]  . JOINT REPLACEMENT    . PARTIAL NEPHRECTOMY Left 05/1974   stone disease  . PERMANENT PACEMAKER GENERATOR CHANGE N/A 05/10/2014   Procedure: PERMANENT PACEMAKER GENERATOR CHANGE;  Surgeon: Thompson Grayer, MD;  Location: [facility] CATH LAB;  Service: Cardiovascular;  Laterality: N/A;  . TONSILLECTOMY AND ADENOIDECTOMY  1930's  . TOTAL KNEE ARTHROPLASTY Right 2001    Allergies  Allergen Reactions  . Brimonidine Tartrate-Timolol Other (See Comments)    REACTION: systemic malaise amigen eye drop  . Penicillins Hives  . Breo Ellipta [Fluticasone Furoate-Vilanterol] Other (See Comments)    Ran blood pressure up Increased blood pressure    Outpatient Encounter Medications as of 08/21/2020  Medication Sig  . amLODipine (NORVASC) 2.5 MG tablet Take 1 tablet (2.5 mg total) by mouth daily.  [name] azelastine (ASTELIN) 0.1 % nasal spray USE 1 TO 2 SPRAYS INTO BOTH NOSTRILS TWICE DAILY.  [name] Cholecalciferol (VITAMIN D) 2000 units CAPS Take 1 capsule by mouth daily.  [name] COVID-19 mRNA vaccine, Moderna, 100 MCG/0.5ML injection Inject into the muscle.  . famotidine (PEPCID) 40 MG tablet Take 1 tablet (40 mg total) by mouth at bedtime.  . fluticasone (FLONASE) 50 MCG/ACT nasal spray USE 2 SPRAYS EACH NOSTRIL ONCE A DAY AS NEEDED FOR ALLERGIES OR CONGESTION.  . furosemide (LASIX) 80 MG tablet Take 1 tablet (80 mg total) by mouth daily.  [name] gabapentin (NEURONTIN) 100 MG capsule Take 1 capsule (100 mg total) by mouth at bedtime.  [name] latanoprost (XALATAN) 0.005 % ophthalmic solution Place 1 drop into both eyes at bedtime.  [name] levothyroxine (SYNTHROID) 88 MCG tablet TAKE 1 TABLET BY MOUTH  DAILY BEFORE BREAKFAST  . losartan (COZAAR) 100 MG tablet Take 100 mg by mouth 2 (two) times daily.  . LUTEIN PO Take 25 mg by mouth daily.   . Magnesium 250 MG TABS Take 1 tablet by mouth daily.  . metoprolol succinate (TOPROL-XL) 100 MG 24 hr tablet Take 100 mg  by mouth daily.  . montelukast (SINGULAIR) 10 MG tablet TAKE 1 TABLET BY MOUTH  DAILY  . Multiple Vitamins-Minerals (PRESERVISION AREDS 2) CAPS Take 1 capsule by mouth in the morning and at bedtime.   . potassium chloride SA (KLOR-CON) 20 MEQ tablet Take 1 tablet (20 mEq total) by mouth 2 (two) times daily.  . predniSONE (DELTASONE) 5 MG tablet Take 1 tablet (5 mg total) by mouth daily with breakfast.  . Rivaroxaban (XARELTO) 15 MG TABS tablet TAKE 1 TABLET BY MOUTH  DAILY WITH SUPPER  . SYMBICORT 160-4.5 MCG/ACT inhaler INHALE 2 PUFFS BY MOUTH  INTO THE LUNGS TWO TIMES  DAILY  . VENTOLIN HFA 108 (90 Base) MCG/ACT inhaler USE 2 PUFFS EVERY 6 HOURS AS NEEDED FOR SHORTNESS OF BREATH AND WHEEZING.  . [  DISCONTINUED] furosemide (LASIX) 40 MG tablet Take 1.5 tablets (60 mg total) by mouth daily. Ok to use an extra dose of 40 mg for weight gain (Patient taking differently: Take 80 mg by mouth daily. Ok to use an extra dose of 40 mg for weight gain)  . [DISCONTINUED] potassium chloride SA (KLOR-CON) 20 MEQ tablet Take 40 mEq by mouth daily.   No facility-administered encounter medications on file as of 08/21/2020.    Review of Systems:  Review of Systems  Constitutional: Positive for unexpected weight change.  HENT: Negative.   Respiratory: Positive for cough.   Cardiovascular: Positive for leg swelling.  Gastrointestinal: Negative.   Genitourinary: Negative.   Musculoskeletal: Negative.   Skin: Negative.   Neurological: Negative.   Psychiatric/Behavioral: Positive for confusion.    Health Maintenance  Topic Date Due  . DEXA SCAN  Never done  . INFLUENZA VACCINE  11/06/2020  . TETANUS/TDAP  02/23/2029  . COVID-19 Vaccine  Completed  . PNA vac Low Risk Adult  Completed  . HPV VACCINES  Aged Out    Physical Exam: Vitals:   08/21/20 1553  BP: 134/86  Pulse: 83  Temp: (!) 96.4 F (35.8 C)  TempSrc: Skin  SpO2: 98%  Weight: 133 lb 12.8 oz (60.7 kg)  Height: 5\' 3"  (1.6 m)   Body  mass index is 23.7 kg/m. Physical Exam  Constitutional: Oriented to person, place, and time. Well-developed and well-nourished.  HENT: Hard of hearing Head: Normocephalic.  Mouth/Throat: Oropharynx is clear and moist.  Eyes: Pupils are equal, round, and reactive to light.  Neck: Neck supple.  Cardiovascular: Normal rate and normal heart sounds.  Murmur Present Pulmonary/Chest: Effort normal and breath sounds normal. No respiratory distress. No wheezes. She has no rales.  Abdominal: Soft. Bowel sounds are normal. No distension. There is no tenderness. There is no rebound.  Musculoskeletal: Edema Present Bilateral With Chronic Venous changes A small area in Right leg where it has discharge NO Redness. No Tender Lymphadenopathy: none Neurological: Alert and oriented to person, place, and time.  Skin: Skin is warm and dry.  Psychiatric: Normal mood and affect. Behavior is normal. Thought content normal.    Labs reviewed: Basic Metabolic Panel: Recent Labs    09/20/19 1622 10/27/19 1928 10/28/19 0028  NA 140 142 139  K 4.3 3.5 3.6  CL 99 100 100  CO2 27 29 25   GLUCOSE 101* 102* 136*  BUN 19 21 19   CREATININE 1.20* 0.91 0.96  CALCIUM 9.7 9.4 9.3  MG  --  2.5*  --   PHOS  --  3.0  --    Liver Function Tests: Recent Labs    10/27/19 1928  AST 35  ALT 26  ALKPHOS 57  BILITOT 1.0  PROT 7.1  ALBUMIN 4.3   No results for input(s): LIPASE, AMYLASE in the last 8760 hours. No results for input(s): AMMONIA in the last 8760 hours. CBC: Recent Labs    10/27/19 1928 10/28/19 0028  WBC 4.9 4.6  NEUTROABS 3.3 4.0  HGB 13.1 13.5  HCT 40.8 42.3  MCV 99.0 100.0  PLT 229 225   Lipid Panel: No results for input(s): CHOL, HDL, LDLCALC, TRIG, CHOLHDL, LDLDIRECT in the last 8760 hours. Lab Results  Component Value Date   HGBA1C 5.7 (H) 06/25/2017    Procedures since last visit: No results found.  Assessment/Plan 1. Bilateral leg edema with Discharge Increase her Lasix  to 80 mg QD Also Increase her Potassium to 20 meq  BID Repeat labs are pending tomorrow Will probably need again in few weeks Reduce salt intake  Elevate Leg when sitting  2. Weight gain Does have PRN lasix also  Other issues  Essential hypertension BP running on Lower side Will decrase the Norvasc to 2.5 mg Can eventually taper her off it    Permanent atrial fibrillation (HCC) On Metoprolol and Xarelto Follows with Cardiology  COPD mixed type ([place]) On Symbicort and Proair Also on Chronic Prednisone Since she is c/o Cough at night will add Pepcid 40 mg QD  Chronic venous insufficiency Continue to elevate Legs Poor vision Staying Independent right now  Discussed with Facility Nurse for her to get 4th Booster Shot   Labs/tests ordered:  Labs pending Next appt:  12/18/2020

## 2020-08-22 ENCOUNTER — Ambulatory Visit: Payer: Medicare Other | Admitting: Family Medicine

## 2020-08-22 DIAGNOSIS — I4439 Other atrioventricular block: Secondary | ICD-10-CM | POA: Diagnosis not present

## 2020-08-22 DIAGNOSIS — I5032 Chronic diastolic (congestive) heart failure: Secondary | ICD-10-CM | POA: Diagnosis not present

## 2020-08-22 DIAGNOSIS — I10 Essential (primary) hypertension: Secondary | ICD-10-CM | POA: Diagnosis not present

## 2020-08-22 DIAGNOSIS — I4821 Permanent atrial fibrillation: Secondary | ICD-10-CM | POA: Diagnosis not present

## 2020-08-22 LAB — HEPATIC FUNCTION PANEL
ALT: 24 (ref 7–35)
AST: 27 (ref 13–35)
Alkaline Phosphatase: 71 (ref 25–125)
Bilirubin, Total: 0.8

## 2020-08-22 LAB — BASIC METABOLIC PANEL
BUN: 27 — AB (ref 4–21)
CO2: 28 — AB (ref 13–22)
Chloride: 101 (ref 99–108)
Creatinine: 1 (ref 0.5–1.1)
Glucose: 66
Potassium: 3.3 — AB (ref 3.4–5.3)
Sodium: 143 (ref 137–147)

## 2020-08-22 LAB — CBC: RBC: 4.03 (ref 3.87–5.11)

## 2020-08-22 LAB — CBC AND DIFFERENTIAL
HCT: 38 (ref 36–46)
Hemoglobin: 12.9 (ref 12.0–16.0)
Platelets: 226 (ref 150–399)
WBC: 7.1

## 2020-08-22 LAB — COMPREHENSIVE METABOLIC PANEL
Albumin: 4.4 (ref 3.5–5.0)
Calcium: 9.8 (ref 8.7–10.7)
Globulin: 2.2

## 2020-08-22 LAB — TSH: TSH: 1.2 (ref 0.41–5.90)

## 2020-08-28 DIAGNOSIS — H401132 Primary open-angle glaucoma, bilateral, moderate stage: Secondary | ICD-10-CM | POA: Diagnosis not present

## 2020-08-30 ENCOUNTER — Encounter (HOSPITAL_BASED_OUTPATIENT_CLINIC_OR_DEPARTMENT_OTHER): Payer: Self-pay

## 2020-08-30 ENCOUNTER — Other Ambulatory Visit: Payer: Self-pay

## 2020-08-30 ENCOUNTER — Emergency Department (HOSPITAL_BASED_OUTPATIENT_CLINIC_OR_DEPARTMENT_OTHER)
Admission: EM | Admit: 2020-08-30 | Discharge: 2020-08-30 | Disposition: A | Discharge status: Home / Self Care | Payer: Medicare Other | Attending: Emergency Medicine | Admitting: Emergency Medicine

## 2020-08-30 ENCOUNTER — Emergency Department (HOSPITAL_BASED_OUTPATIENT_CLINIC_OR_DEPARTMENT_OTHER): Payer: Medicare Other | Admitting: Radiology

## 2020-08-30 DIAGNOSIS — Z950 Presence of cardiac pacemaker: Secondary | ICD-10-CM | POA: Diagnosis not present

## 2020-08-30 DIAGNOSIS — Z79899 Other long term (current) drug therapy: Secondary | ICD-10-CM | POA: Insufficient documentation

## 2020-08-30 DIAGNOSIS — I5032 Chronic diastolic (congestive) heart failure: Secondary | ICD-10-CM | POA: Insufficient documentation

## 2020-08-30 DIAGNOSIS — Z7901 Long term (current) use of anticoagulants: Secondary | ICD-10-CM | POA: Insufficient documentation

## 2020-08-30 DIAGNOSIS — R062 Wheezing: Secondary | ICD-10-CM | POA: Insufficient documentation

## 2020-08-30 DIAGNOSIS — R0602 Shortness of breath: Secondary | ICD-10-CM | POA: Insufficient documentation

## 2020-08-30 DIAGNOSIS — N183 Chronic kidney disease, stage 3 unspecified: Secondary | ICD-10-CM | POA: Diagnosis not present

## 2020-08-30 DIAGNOSIS — J449 Chronic obstructive pulmonary disease, unspecified: Secondary | ICD-10-CM | POA: Diagnosis not present

## 2020-08-30 DIAGNOSIS — R0600 Dyspnea, unspecified: Secondary | ICD-10-CM | POA: Diagnosis not present

## 2020-08-30 DIAGNOSIS — E039 Hypothyroidism, unspecified: Secondary | ICD-10-CM | POA: Diagnosis not present

## 2020-08-30 DIAGNOSIS — I130 Hypertensive heart and chronic kidney disease with heart failure and stage 1 through stage 4 chronic kidney disease, or unspecified chronic kidney disease: Secondary | ICD-10-CM | POA: Diagnosis not present

## 2020-08-30 DIAGNOSIS — Z96651 Presence of right artificial knee joint: Secondary | ICD-10-CM | POA: Diagnosis not present

## 2020-08-30 DIAGNOSIS — R059 Cough, unspecified: Secondary | ICD-10-CM | POA: Diagnosis not present

## 2020-08-30 LAB — CBC WITH DIFFERENTIAL/PLATELET
Abs Immature Granulocytes: 0.08 10*3/uL — ABNORMAL HIGH (ref 0.00–0.07)
Basophils Absolute: 0.1 10*3/uL (ref 0.0–0.1)
Basophils Relative: 1 %
Eosinophils Absolute: 0.1 10*3/uL (ref 0.0–0.5)
Eosinophils Relative: 1 %
HCT: 39.1 % (ref 36.0–46.0)
Hemoglobin: 12.7 g/dL (ref 12.0–15.0)
Immature Granulocytes: 1 %
Lymphocytes Relative: 9 %
Lymphs Abs: 0.8 10*3/uL (ref 0.7–4.0)
MCH: 31.7 pg (ref 26.0–34.0)
MCHC: 32.5 g/dL (ref 30.0–36.0)
MCV: 97.5 fL (ref 80.0–100.0)
Monocytes Absolute: 0.8 10*3/uL (ref 0.1–1.0)
Monocytes Relative: 8 %
Neutro Abs: 7.4 10*3/uL (ref 1.7–7.7)
Neutrophils Relative %: 80 %
Platelets: 271 10*3/uL (ref 150–400)
RBC: 4.01 MIL/uL (ref 3.87–5.11)
RDW: 14 % (ref 11.5–15.5)
WBC: 9.3 10*3/uL (ref 4.0–10.5)
nRBC: 0 % (ref 0.0–0.2)

## 2020-08-30 LAB — BASIC METABOLIC PANEL
Anion gap: 10 (ref 5–15)
BUN: 29 mg/dL — ABNORMAL HIGH (ref 8–23)
CO2: 26 mmol/L (ref 22–32)
Calcium: 9.7 mg/dL (ref 8.9–10.3)
Chloride: 104 mmol/L (ref 98–111)
Creatinine, Ser: 0.97 mg/dL (ref 0.44–1.00)
GFR, Estimated: 55 mL/min — ABNORMAL LOW (ref >60)
Glucose, Bld: 106 mg/dL — ABNORMAL HIGH (ref 70–99)
Potassium: 3.8 mmol/L (ref 3.5–5.1)
Sodium: 140 mmol/L (ref 135–145)

## 2020-08-30 LAB — BRAIN NATRIURETIC PEPTIDE: B Natriuretic Peptide: 391.7 pg/mL — ABNORMAL HIGH (ref 0.0–100.0)

## 2020-08-30 LAB — TROPONIN I (HIGH SENSITIVITY): Troponin I (High Sensitivity): 6 ng/L (ref <18)

## 2020-08-30 MED ORDER — DOXYCYCLINE HYCLATE 100 MG PO CAPS: 100.0000 mg | ORAL_CAPSULE | Freq: Two times a day (BID) | ORAL | 0 refills | Status: AC

## 2020-08-30 NOTE — ED Triage Notes (Signed)
She tells me she c/o congested cough productive of sm. Amts. Of clear phlegm plus "I'm a little more short of breath than usual" x 2 days. She denies fever/n/v/d and is in no distress.

## 2020-08-30 NOTE — ED Notes (Signed)
Discharged by Ria Bush, RN

## 2020-08-30 NOTE — Discharge Instructions (Addendum)
I started you on an antibiotic called doxycycline to treat for possible lung infection.  Your x-ray did not show any obvious pneumonia, but I felt that clinically we could treat you with antibiotics for this presentation.  Your white blood cell count was on the very high side of normal.  You received 1 dose of doxycycline in the ER today.  Your next dose is due tonight before bedtime.  You can keep taking mucinex and using albuterol as needed for cough, congestion and wheezing.  Your lab work also shows some increase in your heart failure number.  This was a mild change.  You are not needing oxygen thankfully.  I would advise that your primary doctor consider continued lasix or diuresis to pull water out of your lungs.  Gave a copy of your prescriptions as well as your blood test from today to bring back to your facility.  Please give this to the nurses or doctors.

## 2020-08-30 NOTE — ED Notes (Signed)
Patient arrived to the department with CC of shob. Patient does have a pulmonary hx noted in her chart. Patient vitals were taken and EKG obtained at arrival to room. Patient does appear shob in bed, however does not feel a significant difference from her normal baseline. BBS diminished w/o obvious wheeze, rhonchi, or other adventitious sounds. Patient endorses a productive cough w/ clear sputum and a runny nose. RT assessment completed at the time of this encounter.

## 2020-08-30 NOTE — ED Provider Notes (Signed)
[facility] EMERGENCY DEPT Provider Note   CSN: [id number] Arrival date & time: 08/30/20  0865     History Chief Complaint  Patient presents with  . Shortness of Breath    Jody Blake is a 87 y.o. female with history of bronchitis, COPD (not a smoker, not on oxygen) presenting to the emergency department with productive cough.  The patient ports her symptom onset approximately 4 days ago, on Sunday, with a productive and coarse cough.  She said this is similar to bronchitis flareups in the past.  She has been using Mucinex and her albuterol inhaler, but continues to have coughing.  She was advised by nursing facility to come for evaluation in the ED.  She wonders whether she may need antibiotics, and in the past has needed antibiotics.  She reports she has had 4 doses of the COVID-vaccine.  She denies fevers or chills at home.  She denies any chest pain or pressure.  HPI     Past Medical History:  Diagnosis Date  . Anemia   . Aortic stenosis    a. Echo 09/06/12 EF 55-60%, no WMA, G2DD, Ao valve sclerosis w/ mod stenosis, LA mildly dilated, PA pressure 43mmHg  . Asthmatic bronchitis   . Complete heart block [facility])    s/p permanent pacemaker 06/27/1999 (Battery change 06/2007 and 2016).  s/p AV nodal ablation by Dr [name] 2016.  [name] COPD (chronic obstructive pulmonary disease) (HCC)    Dr. [name]  . DDD (degenerative disc disease)   . Diastolic dysfunction    a. Echo 09/06/12 EF 55-60%, no WMA, G2DD, Ao valve sclerosis w/ mod stenosis, LA mildly dilated, PA pressure 25mmHg  . Diverticulosis   . DVT (deep vein thrombosis) in pregnancy 1954   a. LLE  . Hypertension   . Hypothyroidism    on medication  . Macular degeneration    Dr. [name]  . Osteoarthrosis, unspecified whether generalized or localized, other specified sites   . PAF (paroxysmal atrial fibrillation) (HCC)    Stopped flecainide, on amiodarone but still has bouts of A FIB (mostly in mornings).   . Pure  hypercholesterolemia   . Staghorn calculus    Left    Patient Active Problem List   Diagnosis Date Noted  . Lobar pneumonia ([place]) 10/28/2019  . CAP (community acquired pneumonia) 10/27/2019  . Plantar fasciitis of right foot 07/07/2019  . Acute embolism and thrombosis of deep vein of lower extremity ([place]) 03/18/2018  . Osteoarthritis of right glenohumeral joint 08/22/2017  . Macular pucker, right eye 07/08/2017  . Primary open angle glaucoma of both eyes, mild stage 07/08/2017  . TIA (transient ischemic attack) 06/24/2017  . Hypercoagulable state due to atrial fibrillation ([place]) 03/05/2017  . Ataxia 11/14/2016  . DVT (deep vein thrombosis) in pregnancy 11/14/2016  . CKD (chronic kidney disease), stage III ([place]) 11/14/2016  . Chronic diastolic congestive heart failure ([place]) 11/14/2016  . Hypertensive urgency 11/14/2016  . Hypothyroidism 11/08/2015  . Age-related macular degeneration, dry, both eyes 09/05/2015  . Bilateral ocular hypertension 09/05/2015  . Pseudophakia of both eyes 09/05/2015  . Pleural effusion on left 06/17/2015  . Complete heart block ([place]) 06/22/2014  . Sick sinus syndrome ([place]) 04/29/2014  . Tachycardia-bradycardia syndrome ([place]) 01/06/2013  . Cardiac pacemaker in situ 09/07/2012  . Long term (current) use of anticoagulants 09/07/2012  . Orthostatic hypotension 09/07/2012  . Syncope 09/06/2012  . Choroidal nevus of left eye 07/26/2011  . COPD mixed type ([place]) 04/26/2010  . Seasonal  and perennial allergic rhinitis 10/27/2008  . Essential hypertension 04/19/2008  . Permanent atrial fibrillation ([place]) 04/19/2008  . GERD 10/29/2007  . APPENDECTOMY, HX OF 04/06/2007    Past Surgical History:  Procedure Laterality Date  . APPENDECTOMY  ~ 1941  . AV NODE ABLATION  05/10/2014  . AV NODE ABLATION N/A 05/10/2014   Procedure: AV NODE ABLATION;  Surgeon: Thompson Grayer, MD;  Location: [facility] CATH LAB;  Service: Cardiovascular;  Laterality: N/A;  . BUNIONECTOMY WITH  HAMMERTOE RECONSTRUCTION Bilateral ~ 1990  . CARDIAC PACEMAKER PLACEMENT  06/27/99   Medtronic PM implanted by Dr [name]  . CARDIOVERSION N/A 12/02/2013   Procedure: CARDIOVERSION;  Surgeon: [name], MD;  Location: [facility] ENDOSCOPY;  Service: Cardiovascular;  Laterality: N/A;  . CATARACT EXTRACTION W/ INTRAOCULAR LENS  IMPLANT, BILATERAL Bilateral   . COLONOSCOPY WITH PROPOFOL N/A 04/22/2013   Procedure: COLONOSCOPY WITH PROPOFOL;  Surgeon: [name], MD;  Location: [facility] ENDOSCOPY;  Service: Endoscopy;  Laterality: N/A;  . DILATION AND CURETTAGE OF UTERUS  X 2   "when I was going thru menopause"  . ESOPHAGOGASTRODUODENOSCOPY (EGD) WITH PROPOFOL N/A 04/22/2013   Procedure: ESOPHAGOGASTRODUODENOSCOPY (EGD) WITH PROPOFOL;  Surgeon: [name], MD;  Location: [facility] ENDOSCOPY;  Service: Endoscopy;  Laterality: N/A;  . HERNIA REPAIR    . INCISIONAL HERNIA REPAIR    . INSERT / REPLACE / REMOVE PACEMAKER  06/2007   "took out the old; put in new"  . INSERT / REPLACE / REMOVE PACEMAKER  05/10/2014   MDT PPM generator change by Dr [name]  . JOINT REPLACEMENT    . PARTIAL NEPHRECTOMY Left 05/1974   stone disease  . PERMANENT PACEMAKER GENERATOR CHANGE N/A 05/10/2014   Procedure: PERMANENT PACEMAKER GENERATOR CHANGE;  Surgeon: Thompson Grayer, MD;  Location: [facility] CATH LAB;  Service: Cardiovascular;  Laterality: N/A;  . TONSILLECTOMY AND ADENOIDECTOMY  1930's  . TOTAL KNEE ARTHROPLASTY Right 2001     OB History    Gravida  2   Para  2   Term      Preterm      AB      Living  2     SAB      IAB      Ectopic      Multiple      Live Births              Family History  Problem Relation Age of Onset  . Malignant hypertension Father   . Hypertension Father   . Renal Disease Father   . Breast cancer Mother   . Heart attack Brother   . Stroke Brother     Social History   Tobacco Use  . Smoking status: Never Smoker  . Smokeless tobacco: Never Used  Vaping Use  . Vaping  Use: Never used  Substance Use Topics  . Alcohol use: Yes    Alcohol/week: 7.0 standard drinks    Types: 7 Glasses of wine per week    Comment: occasionally  . Drug use: No    Home Medications Prior to Admission medications   Medication Sig Start Date End Date Taking? Authorizing Provider  doxycycline (VIBRAMYCIN) 100 MG capsule Take 1 capsule (100 mg total) by mouth 2 (two) times daily for 7 days. 08/30/20 09/06/20 Yes Genni Buske, Carola Rhine, MD  amLODipine (NORVASC) 2.5 MG tablet Take 1 tablet (2.5 mg total) by mouth daily. 08/17/20   [name], MD  azelastine (ASTELIN) 0.1 % nasal spray  USE 1 TO 2 SPRAYS INTO BOTH NOSTRILS TWICE DAILY. 12/16/19   Baird Lyons D, MD  Cholecalciferol (VITAMIN D) 2000 units CAPS Take 1 capsule by mouth daily.    [provider]  COVID-19 mRNA vaccine, Moderna, 100 MCG/0.5ML injection Inject into the muscle. 08/18/20   Carlyle Basques, MD  famotidine (PEPCID) 40 MG tablet Take 1 tablet (40 mg total) by mouth at bedtime. 08/17/20   [name], MD  fluticasone (FLONASE) 50 MCG/ACT nasal spray USE 2 SPRAYS EACH NOSTRIL ONCE A DAY AS NEEDED FOR ALLERGIES OR CONGESTION. 12/15/19   Baird Lyons D, MD  furosemide (LASIX) 80 MG tablet Take 1 tablet (80 mg total) by mouth daily. 08/21/20   [name], MD  gabapentin (NEURONTIN) 100 MG capsule Take 1 capsule (100 mg total) by mouth at bedtime. 07/14/20   [name], MD  latanoprost (XALATAN) 0.005 % ophthalmic solution Place 1 drop into both eyes at bedtime.    [provider]  levothyroxine (SYNTHROID) 88 MCG tablet TAKE 1 TABLET BY MOUTH  DAILY BEFORE BREAKFAST 03/29/19   Reed, Tiffany L, DO  losartan (COZAAR) 100 MG tablet Take 100 mg by mouth 2 (two) times daily.    [provider]  LUTEIN PO Take 25 mg by mouth daily.     [provider]  Magnesium 250 MG TABS Take 1 tablet by mouth daily.    [provider]  metoprolol succinate (TOPROL-XL) 100 MG 24 hr tablet  Take 100 mg by mouth daily. 10/18/19   [provider]  montelukast (SINGULAIR) 10 MG tablet TAKE 1 TABLET BY MOUTH  DAILY 02/17/19   Deneise Lever, MD  Multiple Vitamins-Minerals (PRESERVISION AREDS 2) CAPS Take 1 capsule by mouth in the morning and at bedtime.     [provider]  potassium chloride SA (KLOR-CON) 20 MEQ tablet Take 1 tablet (20 mEq total) by mouth 2 (two) times daily. 08/21/20   [name], MD  predniSONE (DELTASONE) 5 MG tablet Take 1 tablet (5 mg total) by mouth daily with breakfast. 05/13/19   Deneise Lever, MD  Rivaroxaban (XARELTO) 15 MG TABS tablet TAKE 1 TABLET BY MOUTH  DAILY WITH SUPPER 05/10/19   Jerline Pain, MD  SYMBICORT 160-4.5 MCG/ACT inhaler INHALE 2 PUFFS BY MOUTH  INTO THE LUNGS TWO TIMES  DAILY 02/17/19   Deneise Lever, MD  VENTOLIN HFA 108 (90 Base) MCG/ACT inhaler USE 2 PUFFS EVERY 6 HOURS AS NEEDED FOR SHORTNESS OF BREATH AND WHEEZING. 03/02/19   Deneise Lever, MD    Allergies    Brimonidine tartrate-timolol, Penicillins, and Breo ellipta [fluticasone furoate-vilanterol]  Review of Systems   Review of Systems  Constitutional: Negative for chills and fever.  Eyes: Negative for pain and visual disturbance.  Respiratory: Positive for cough and shortness of breath.   Cardiovascular: Negative for chest pain and palpitations.  Gastrointestinal: Negative for abdominal pain, nausea and vomiting.  Musculoskeletal: Negative for arthralgias and back pain.  Skin: Negative for color change and rash.  Neurological: Negative for syncope and light-headedness.  All other systems reviewed and are negative.   Physical Exam Updated Vital Signs BP (!) 149/82 (BP Location: Right Arm)   Pulse (!) 52   Temp 97.9 F (36.6 C) (Oral)   Resp 14   LMP 04/08/1974   SpO2 100%   Physical Exam Constitutional:      General: She is not in acute distress. HENT:  Head: Normocephalic and atraumatic.  Eyes:     Conjunctiva/sclera:  Conjunctivae normal.     Pupils: Pupils are equal, round, and reactive to light.  Cardiovascular:     Rate and Rhythm: Normal rate and regular rhythm.  Pulmonary:     Effort: Pulmonary effort is normal. No respiratory distress.     Comments: Productive cough, faint expiratory wheezing Abdominal:     General: There is no distension.     Tenderness: There is no abdominal tenderness.  Skin:    General: Skin is warm and dry.  Neurological:     General: No focal deficit present.     Mental Status: She is alert. Mental status is at baseline.  Psychiatric:        Mood and Affect: Mood normal.        Behavior: Behavior normal.     ED Results / Procedures / Treatments   Labs (all labs ordered are listed, but only abnormal results are displayed) Labs Reviewed  BASIC METABOLIC PANEL - Abnormal; Notable for the following components:      Result Value   Glucose, Bld 106 (*)    BUN 29 (*)    GFR, Estimated 55 (*)    All other components within normal limits  CBC WITH DIFFERENTIAL/PLATELET - Abnormal; Notable for the following components:   Abs Immature Granulocytes 0.08 (*)    All other components within normal limits  BRAIN NATRIURETIC PEPTIDE - Abnormal; Notable for the following components:   B Natriuretic Peptide 391.7 (*)    All other components within normal limits  TROPONIN I (HIGH SENSITIVITY)    EKG EKG Interpretation  Date/Time:  Wednesday Aug 30 2020 10:10:21 EDT Ventricular Rate:  75 PR Interval:    QRS Duration: 168 QT Interval:  449 QTC Calculation: 502 R Axis:   -80 Text Interpretation: Atrial fibrillation Ventricular premature complex IVCD, consider atypical RBBB LVH with IVCD, LAD and secondary repol abnrm No significant change from prior ecg October 27 2019 no STEMI Confirmed by Octaviano Glow [phone]) on 08/30/2020 10:21:59 AM   Radiology DG Chest 2 View  Result Date: 08/30/2020 CLINICAL DATA:  Difficulty breathing EXAM: CHEST - 2 VIEW COMPARISON:  11/23/2019  FINDINGS: Cardiac shadow is mildly enlarged but stable. Pacing device is again seen and stable. The lungs are well aerated bilaterally. Chronic blunting of left costophrenic angle is seen. Aortic calcifications are noted. No acute bony abnormality is seen. IMPRESSION: Chronic changes without acute abnormality. Electronically Signed   By: Inez Catalina M.D.   On: 08/30/2020 11:12    Procedures Procedures   Medications Ordered in ED Medications - No data to display  ED Course  I have reviewed the triage vital signs and the nursing notes.  Pertinent labs & imaging results that were available during my care of the patient were reviewed by me and considered in my medical decision making (see chart for details).   Patient is here with cough and SOB.  DDx includes bronchitis vs PNA vs CHF exacerbation vs other  Labs reviewed, imaging reviewed - labs near baseline, BNP mildly elevated, trop 6.  ECG reviewed - no acute ischemic changes. I have a lower suspicion for ACS or PE.  This may be mild CHF component in setting of chronic bronchitis.  PO antibiotics given and will discharge with doxycycline  She reports they recently increased her lasix dosing this week, and she is urinating a lot at her nursing facility.  I'll defer further management of her  diuretics to her PCP there.  I don't think she needs emergent diuresis.  She is breathing quite comfortably here and is not hypoxic.  Clinical Course as of 08/30/20 1412  Wed Aug 30, 2020  1149 BNP is very mildly elevated.  White blood cell count is still within normal limits, but higher than her normal baseline.  I think clinically be reasonable to treat her with doxycycline for a pulmonary infection, and also advised some increased diuresis. [MT]  5093 She is not hypoxic, and is otherwise clinically well-appearing.  I think should be reasonably safe for discharge at this time with PCP follow up [MT]    Clinical Course User Index [MT] Yari Szeliga, Carola Rhine, MD    Final Clinical Impression(s) / ED Diagnoses Final diagnoses:  Cough  Shortness of breath    Rx / DC Orders ED Discharge Orders         Ordered    doxycycline (VIBRAMYCIN) 100 MG capsule  2 times daily        08/30/20 1210           Wyvonnia Dusky, MD 08/30/20 [phone]

## 2020-08-30 NOTE — ED Notes (Signed)
MD at bedside. 

## 2020-08-30 NOTE — ED Notes (Signed)
ED Provider at bedside. 

## 2020-09-01 ENCOUNTER — Non-Acute Institutional Stay: Payer: Medicare Other | Admitting: Internal Medicine

## 2020-09-01 ENCOUNTER — Encounter: Payer: Self-pay | Admitting: Internal Medicine

## 2020-09-01 ENCOUNTER — Other Ambulatory Visit: Payer: Self-pay

## 2020-09-01 VITALS — BP 138/88 | HR 72 | Temp 96.3°F | Ht 63.0 in | Wt 128.6 lb

## 2020-09-01 DIAGNOSIS — R059 Cough, unspecified: Secondary | ICD-10-CM

## 2020-09-01 DIAGNOSIS — I4821 Permanent atrial fibrillation: Secondary | ICD-10-CM

## 2020-09-01 DIAGNOSIS — I10 Essential (primary) hypertension: Secondary | ICD-10-CM | POA: Diagnosis not present

## 2020-09-01 DIAGNOSIS — J449 Chronic obstructive pulmonary disease, unspecified: Secondary | ICD-10-CM

## 2020-09-01 DIAGNOSIS — I872 Venous insufficiency (chronic) (peripheral): Secondary | ICD-10-CM | POA: Diagnosis not present

## 2020-09-01 DIAGNOSIS — R600 Localized edema: Secondary | ICD-10-CM

## 2020-09-01 DIAGNOSIS — R0602 Shortness of breath: Secondary | ICD-10-CM | POA: Diagnosis not present

## 2020-09-01 DIAGNOSIS — I5032 Chronic diastolic (congestive) heart failure: Secondary | ICD-10-CM

## 2020-09-01 DIAGNOSIS — E7800 Pure hypercholesterolemia, unspecified: Secondary | ICD-10-CM

## 2020-09-01 DIAGNOSIS — R531 Weakness: Secondary | ICD-10-CM

## 2020-09-01 MED ORDER — PREDNISONE 20 MG PO TABS: 20.0000 mg | ORAL_TABLET | Freq: Every day | ORAL | 0 refills | Status: DC

## 2020-09-01 NOTE — Patient Instructions (Signed)
Let us know if Weakness gets worse then we have to back off on your Lasix If get more SOB and Wheezing will need to let us know

## 2020-09-01 NOTE — Progress Notes (Signed)
Location: [place] of Service:  Clinic (12)  Provider:   Code Status: DNR Goals of Care:  Advanced Directives 08/30/2020  Does Patient Have a Medical Advance Directive? No  Type of Advance Directive -  Does patient want to make changes to medical advance directive? -  Copy of [place] in Chart? -  Would patient like information on creating a medical advance directive? No - Patient declined  Pre-existing out of facility DNR order (yellow form or pink MOST form) -     Chief Complaint  Patient presents with  . Medical Management of Chronic Issues    Patient returns to the clinic to follow up from ER visit.     HPI: Patient is a 87 y.o. female seen today for an acute visit for Follow up from ED for SOB and Cough  Patient has a history of A. fib on Xarelto s/p AV nodal ablation, s/pPPM in 2009 Aortic stenosis COPD with frequent exacerbations. Not on oxygen. On chronic prednisone. Follows closely with Dr. [name].Is enrolled in palliative care Chronic diastolic CHF on high doses of Lasix History of chronic venous insufficiency Vision loss due to macular degeneration and glaucoma  Was seen Initially for Right leg swelling Increased her Lasix to 80 mg from 60 mg.  But then she started having cough wheezing and SOB Went to ED. Chest Xray was negative. BNP was elevated in 300 Discharged on Doxycyline 100 mg BID for 7 days  Patient does feel better. But still has cough and Mucus Has chronic Cough too so hard to know if it is worsen. SOB is Slightly better LE edema is much Better. Has lost 4 lbs Does feel Weak   Past Medical History:  Diagnosis Date  . Anemia   . Aortic stenosis    a. Echo 09/06/12 EF 55-60%, no WMA, G2DD, Ao valve sclerosis w/ mod stenosis, LA mildly dilated, PA pressure 46mmHg  . Asthmatic bronchitis   . Complete heart block [facility])    s/p permanent pacemaker 06/27/1999 (Battery change 06/2007 and 2016).   s/p AV nodal ablation by Dr [name] 2016.  [name] COPD (chronic obstructive pulmonary disease) (HCC)    Dr. [name]  . DDD (degenerative disc disease)   . Diastolic dysfunction    a. Echo 09/06/12 EF 55-60%, no WMA, G2DD, Ao valve sclerosis w/ mod stenosis, LA mildly dilated, PA pressure 80mmHg  . Diverticulosis   . DVT (deep vein thrombosis) in pregnancy 1954   a. LLE  . Hypertension   . Hypothyroidism    on medication  . Macular degeneration    Dr. [name]  . Osteoarthrosis, unspecified whether generalized or localized, other specified sites   . PAF (paroxysmal atrial fibrillation) (HCC)    Stopped flecainide, on amiodarone but still has bouts of A FIB (mostly in mornings).   . Pure hypercholesterolemia   . Staghorn calculus    Left    Past Surgical History:  Procedure Laterality Date  . APPENDECTOMY  ~ 1941  . AV NODE ABLATION  05/10/2014  . AV NODE ABLATION N/A 05/10/2014   Procedure: AV NODE ABLATION;  Surgeon: Thompson Grayer, MD;  Location: [facility] CATH LAB;  Service: Cardiovascular;  Laterality: N/A;  . BUNIONECTOMY WITH HAMMERTOE RECONSTRUCTION Bilateral ~ 1990  . CARDIAC PACEMAKER PLACEMENT  06/27/99   Medtronic PM implanted by Dr [name]  . CARDIOVERSION N/A 12/02/2013   Procedure: CARDIOVERSION;  Surgeon: [name], MD;  Location: [place];  Service:  Cardiovascular;  Laterality: N/A;  . CATARACT EXTRACTION W/ INTRAOCULAR LENS  IMPLANT, BILATERAL Bilateral   . COLONOSCOPY WITH PROPOFOL N/A 04/22/2013   Procedure: COLONOSCOPY WITH PROPOFOL;  Surgeon: [name], MD;  Location: [facility] ENDOSCOPY;  Service: Endoscopy;  Laterality: N/A;  . DILATION AND CURETTAGE OF UTERUS  X 2   "when I was going thru menopause"  . ESOPHAGOGASTRODUODENOSCOPY (EGD) WITH PROPOFOL N/A 04/22/2013   Procedure: ESOPHAGOGASTRODUODENOSCOPY (EGD) WITH PROPOFOL;  Surgeon: [name], MD;  Location: [facility] ENDOSCOPY;  Service: Endoscopy;  Laterality: N/A;  . HERNIA REPAIR    . INCISIONAL HERNIA REPAIR    . INSERT  / REPLACE / REMOVE PACEMAKER  06/2007   "took out the old; put in new"  . INSERT / REPLACE / REMOVE PACEMAKER  05/10/2014   MDT PPM generator change by Dr [name]  . JOINT REPLACEMENT    . PARTIAL NEPHRECTOMY Left 05/1974   stone disease  . PERMANENT PACEMAKER GENERATOR CHANGE N/A 05/10/2014   Procedure: PERMANENT PACEMAKER GENERATOR CHANGE;  Surgeon: Thompson Grayer, MD;  Location: [facility] CATH LAB;  Service: Cardiovascular;  Laterality: N/A;  . TONSILLECTOMY AND ADENOIDECTOMY  1930's  . TOTAL KNEE ARTHROPLASTY Right 2001    Allergies  Allergen Reactions  . Brimonidine Tartrate-Timolol Other (See Comments)    REACTION: systemic malaise amigen eye drop  . Penicillins Hives  . Breo Ellipta [Fluticasone Furoate-Vilanterol] Other (See Comments)    Ran blood pressure up Increased blood pressure    Outpatient Encounter Medications as of 09/01/2020  Medication Sig  . amLODipine (NORVASC) 2.5 MG tablet Take 1 tablet (2.5 mg total) by mouth daily.  [name] azelastine (ASTELIN) 0.1 % nasal spray USE 1 TO 2 SPRAYS INTO BOTH NOSTRILS TWICE DAILY.  [name] Cholecalciferol (VITAMIN D) 2000 units CAPS Take 1 capsule by mouth daily.  [name] COVID-19 mRNA vaccine, Moderna, 100 MCG/0.5ML injection Inject into the muscle.  [name] doxycycline (VIBRAMYCIN) 100 MG capsule Take 1 capsule (100 mg total) by mouth 2 (two) times daily for 7 days.  . famotidine (PEPCID) 40 MG tablet Take 1 tablet (40 mg total) by mouth at bedtime.  . fluticasone (FLONASE) 50 MCG/ACT nasal spray USE 2 SPRAYS EACH NOSTRIL ONCE A DAY AS NEEDED FOR ALLERGIES OR CONGESTION.  . furosemide (LASIX) 80 MG tablet Take 1 tablet (80 mg total) by mouth daily.  [name] gabapentin (NEURONTIN) 100 MG capsule Take 1 capsule (100 mg total) by mouth at bedtime.  [name] latanoprost (XALATAN) 0.005 % ophthalmic solution Place 1 drop into both eyes at bedtime.  [name] levothyroxine (SYNTHROID) 88 MCG tablet TAKE 1 TABLET BY MOUTH  DAILY BEFORE BREAKFAST  . losartan (COZAAR) 100 MG tablet Take 100  mg by mouth 2 (two) times daily.  . LUTEIN PO Take 25 mg by mouth daily.   . Magnesium 250 MG TABS Take 1 tablet by mouth daily.  . metoprolol succinate (TOPROL-XL) 100 MG 24 hr tablet Take 100 mg by mouth daily.  . montelukast (SINGULAIR) 10 MG tablet TAKE 1 TABLET BY MOUTH  DAILY  . Multiple Vitamins-Minerals (PRESERVISION AREDS 2) CAPS Take 1 capsule by mouth in the morning and at bedtime.   . potassium chloride SA (KLOR-CON) 20 MEQ tablet Take 1 tablet (20 mEq total) by mouth 2 (two) times daily.  . predniSONE (DELTASONE) 20 MG tablet Take 1 tablet (20 mg total) by mouth daily with breakfast.  . predniSONE (DELTASONE) 5 MG tablet Take 1 tablet (5 mg total) by mouth daily with breakfast.  .  Rivaroxaban (XARELTO) 15 MG TABS tablet TAKE 1 TABLET BY MOUTH  DAILY WITH SUPPER  . SYMBICORT 160-4.5 MCG/ACT inhaler INHALE 2 PUFFS BY MOUTH  INTO THE LUNGS TWO TIMES  DAILY  . VENTOLIN HFA 108 (90 Base) MCG/ACT inhaler USE 2 PUFFS EVERY 6 HOURS AS NEEDED FOR SHORTNESS OF BREATH AND WHEEZING.   No facility-administered encounter medications on file as of 09/01/2020.    Review of Systems:  Review of Systems  Constitutional: Positive for activity change.  HENT: Positive for congestion.   Respiratory: Positive for cough and shortness of breath.   Cardiovascular: Positive for leg swelling.  Gastrointestinal: Negative.   Genitourinary: Negative.   Musculoskeletal: Negative.   Skin: Negative.   Neurological: Positive for weakness.  Psychiatric/Behavioral: Negative.     Health Maintenance  Topic Date Due  . DEXA SCAN  Never done  . INFLUENZA VACCINE  11/06/2020  . TETANUS/TDAP  02/23/2029  . COVID-19 Vaccine  Completed  . PNA vac Low Risk Adult  Completed  . Zoster Vaccines- Shingrix  Completed  . HPV VACCINES  Aged Out    Physical Exam: Vitals:   09/01/20 0929  BP: 138/88  Pulse: 72  Temp: (!) 96.3 F (35.7 C)  SpO2: 99%  Weight: 128 lb 9.6 oz (58.3 kg)  Height: 5\' 3"  (1.6 m)    Body mass index is 22.78 kg/m. Physical Exam  Constitutional: Oriented to person, place, and time. Well-developed and well-nourished.  HENT:  Head: Normocephalic.  Mouth/Throat: Oropharynx is clear and moist.  Eyes: Pupils are equal, round, and reactive to light.  Neck: Neck supple.  Cardiovascular: Normal rate and normal heart sounds.  No murmur heard. Pulmonary/Chest: Effort normal and breath sounds normal. No respiratory distress. Some Expiratory Wheezing present  Abdominal: Soft. Bowel sounds are normal. No distension. There is no tenderness. There is no rebound.  Musculoskeletal: Mild Edema Bilateral Lymphadenopathy: none Neurological: Alert and oriented to person, place, and time.  Skin: Skin is warm and dry.  Psychiatric: Normal mood and affect. Behavior is normal. Thought content normal.    Labs reviewed: Basic Metabolic Panel: Recent Labs    10/27/19 1928 10/28/19 0028 08/22/20 0000 08/30/20 1039  NA 142 139 143 140  K 3.5 3.6 3.3* 3.8  CL 100 100 101 104  CO2 29 25 28* 26  GLUCOSE 102* 136*  --  106*  BUN 21 19 27* 29*  CREATININE 0.91 0.96 1.0 0.97  CALCIUM 9.4 9.3 9.8 9.7  MG 2.5*  --   --   --   PHOS 3.0  --   --   --   TSH  --   --  1.20  --    Liver Function Tests: Recent Labs    10/27/19 1928 08/22/20 0000  AST 35 27  ALT 26 24  ALKPHOS 57 71  BILITOT 1.0  --   PROT 7.1  --   ALBUMIN 4.3 4.4   No results for input(s): LIPASE, AMYLASE in the last 8760 hours. No results for input(s): AMMONIA in the last 8760 hours. CBC: Recent Labs    10/27/19 1928 10/28/19 0028 08/22/20 0000 08/30/20 1039  WBC 4.9 4.6 7.1 9.3  NEUTROABS 3.3 4.0  --  7.4  HGB 13.1 13.5 12.9 12.7  HCT 40.8 42.3 38 39.1  MCV 99.0 100.0  --  97.5  PLT 229 225 226 271   Lipid Panel: No results for input(s): CHOL, HDL, LDLCALC, TRIG, CHOLHDL, LDLDIRECT in the last 8760 hours. Lab Results  Component Value Date   HGBA1C 5.7 (H) 06/25/2017    Procedures since last  visit: DG Chest 2 View  Result Date: 08/30/2020 CLINICAL DATA:  Difficulty breathing EXAM: CHEST - 2 VIEW COMPARISON:  11/23/2019 FINDINGS: Cardiac shadow is mildly enlarged but stable. Pacing device is again seen and stable. The lungs are well aerated bilaterally. Chronic blunting of left costophrenic angle is seen. Aortic calcifications are noted. No acute bony abnormality is seen. IMPRESSION: Chronic changes without acute abnormality. Electronically Signed   By: Inez Catalina M.D.   On: 08/30/2020 11:12    Assessment/Plan 1. Weakness, Cough and SOB seems like COPD exacerbation Will start her on Prednisone 20 mg for 5 days Continue her 5 mg dose  She has responded to this before. She is already on Doxycyline  Also on Symbicort and    Chronic diastolic congestive heart failure ([place]) Doing well on Lasix incrased dose  BNP was elevated in the ED Has lost some weight and Edema I s much better C/o Weakness BUN and Creat are Good Will continue same dose . Bilateral leg edema Much Better on Increased dose  Other issues  Essential hypertension Doing well on Reduced dose of Norvasc  Permanent atrial fibrillation (HCC) On Metoprolol and Xarelto Follows with Cardiology   Chronic venous insufficiency Continue to elevate Legs Poor vision Staying Independent right now  Discussed with Facility Nurse for her to get 4th Booster Shot   Labs/tests ordered:  * No order type specified * Next appt:  12/18/2020

## 2020-09-05 NOTE — Assessment & Plan Note (Signed)
She is satisfied for now with flonase as we head intol Spring pollen season. Can add antihistamine if needed

## 2020-09-05 NOTE — Assessment & Plan Note (Signed)
Stable at baseline now with appropriate use of rescue inhaler Plan- continue meds

## 2020-09-06 ENCOUNTER — Telehealth: Payer: Self-pay | Admitting: Internal Medicine

## 2020-09-06 MED ORDER — FUROSEMIDE 40 MG PO TABS: 60.0000 mg | ORAL_TABLET | Freq: Every day | ORAL | 3 refills | Status: DC

## 2020-09-06 MED ORDER — POTASSIUM CHLORIDE CRYS ER 20 MEQ PO TBCR: 20.0000 meq | EXTENDED_RELEASE_TABLET | Freq: Two times a day (BID) | ORAL | 3 refills | Status: DC

## 2020-09-06 NOTE — Telephone Encounter (Signed)
Patient c/o Feeling Dizzy especially in the morning.And when she walks BP 118/70 HR 81 POX 99% No Ankle Edema Lungs Clear Told her to reduce lasix to 60 mg Again and discontinue Norvasc

## 2020-09-07 ENCOUNTER — Ambulatory Visit (INDEPENDENT_AMBULATORY_CARE_PROVIDER_SITE_OTHER): Payer: Medicare Other

## 2020-09-07 DIAGNOSIS — I495 Sick sinus syndrome: Secondary | ICD-10-CM | POA: Diagnosis not present

## 2020-09-08 LAB — CUP PACEART REMOTE DEVICE CHECK
Battery Impedance: 474 Ohm
Battery Remaining Longevity: 96 mo
Battery Voltage: 2.79 V
Brady Statistic RV Percent Paced: 98 %
Date Time Interrogation Session: 20220602123146
Implantable Lead Implant Date: 20090318
Implantable Lead Implant Date: 20090318
Implantable Lead Location: 753859
Implantable Lead Location: 753860
Implantable Lead Model: 5076
Implantable Lead Model: 5092
Implantable Pulse Generator Implant Date: 20160202
Lead Channel Impedance Value: 67 Ohm
Lead Channel Impedance Value: 717 Ohm
Lead Channel Pacing Threshold Amplitude: 1 V
Lead Channel Pacing Threshold Pulse Width: 0.4 ms
Lead Channel Setting Pacing Amplitude: 2.5 V
Lead Channel Setting Pacing Pulse Width: 0.4 ms
Lead Channel Setting Sensing Sensitivity: 4 mV

## 2020-09-20 ENCOUNTER — Encounter: Payer: Self-pay | Admitting: Internal Medicine

## 2020-10-02 NOTE — Progress Notes (Signed)
Remote pacemaker transmission.   

## 2020-10-08 DIAGNOSIS — Z20822 Contact with and (suspected) exposure to covid-19: Secondary | ICD-10-CM | POA: Diagnosis not present

## 2020-10-25 DIAGNOSIS — Z20822 Contact with and (suspected) exposure to covid-19: Secondary | ICD-10-CM | POA: Diagnosis not present

## 2020-10-27 ENCOUNTER — Encounter: Payer: Self-pay | Admitting: Cardiology

## 2020-10-27 ENCOUNTER — Ambulatory Visit (INDEPENDENT_AMBULATORY_CARE_PROVIDER_SITE_OTHER): Payer: Medicare Other | Admitting: Cardiology

## 2020-10-27 ENCOUNTER — Other Ambulatory Visit: Payer: Self-pay

## 2020-10-27 VITALS — BP 110/78 | HR 78 | Ht 63.0 in | Wt 130.6 lb

## 2020-10-27 DIAGNOSIS — I5032 Chronic diastolic (congestive) heart failure: Secondary | ICD-10-CM | POA: Diagnosis not present

## 2020-10-27 DIAGNOSIS — I10 Essential (primary) hypertension: Secondary | ICD-10-CM | POA: Diagnosis not present

## 2020-10-27 DIAGNOSIS — I4821 Permanent atrial fibrillation: Secondary | ICD-10-CM | POA: Diagnosis not present

## 2020-10-27 DIAGNOSIS — Z950 Presence of cardiac pacemaker: Secondary | ICD-10-CM

## 2020-10-27 DIAGNOSIS — I350 Nonrheumatic aortic (valve) stenosis: Secondary | ICD-10-CM | POA: Diagnosis not present

## 2020-10-27 NOTE — Progress Notes (Signed)
Cardiology Office Note:    Date:  10/27/2020   ID:  [name], DOB Sep 08, 1925, MRN [id number]  PCP:  Jody Dad, MD   [facility] Providers Cardiologist:  Jody Furbish, MD     Referring MD: Jody Curry, DO    History of Present Illness:    Jody Blake is a 87 y.o. female here for follow-up of atrial fibrillation status post AV nodal ablation after aggressive medical therapy, pacemaker placement, pacemaker dependent with chronic diastolic heart failure.  Prior EP notes have been reviewed.  She has had previous pneumonia, COPD.  She had a Medtronic dual-chamber pacemaker implanted in 2009 with generator change in 2016.  She is programmed VVIR.  Had a prior leg infection with open sore.  Has had some baseline shortness of breath.  Her edema has resolved, she is now down to 60 mg of Lasix from 80.  Doing much better.  They are taking great care of her at [facility].  Still for quite some time and has felt some longstanding generalized fatigue/weakness.  I had contemplated discontinuing her metoprolol but she did remind me that at 1 point she had forgotten to take her metoprolol for few days and felt terrible.  I wonder if this was coincidence.  She could maybe try to taper this off and see how she feels.  Remember she is pacemaker dependent so it should not affect heart rate.  Past Medical History:  Diagnosis Date   Anemia    Aortic stenosis    a. Echo 09/06/12 EF 55-60%, no WMA, G2DD, Ao valve sclerosis w/ mod stenosis, LA mildly dilated, PA pressure 86mHg   Asthmatic bronchitis    Complete heart block (HCC)    s/p permanent pacemaker 06/27/1999 (Battery change 06/2007 and 2016).  s/p AV nodal ablation by Dr Jody Heman2016.   COPD (chronic obstructive pulmonary disease) (HCC)    Dr. YAnnamaria Boots  DDD (degenerative disc disease)    Diastolic dysfunction    a. Echo 09/06/12 EF 55-60%, no WMA, G2DD, Ao valve sclerosis w/ mod stenosis, LA mildly dilated, PA pressure 442mg    Diverticulosis    DVT (deep vein thrombosis) in pregnancy 1954   a. LLE   Hypertension    Hypothyroidism    on medication   Macular degeneration    Dr. KuEliezer Bottom Osteoarthrosis, unspecified whether generalized or localized, other specified sites    [place]   Stopped flecainide, on amiodarone but still has bouts of A FIB (mostly in mornings).    Pure hypercholesterolemia    Staghorn calculus    Left    Past Surgical History:  Procedure Laterality Date   APPENDECTOMY  ~ 1941   AV NODE ABLATION  05/10/2014   AV NODE ABLATION N/A 05/10/2014   Procedure: AV NODE ABLATION;  Surgeon: JaThompson GrayerMD;  [facility];  Service: Cardiovascular;  Laterality: N/A;   BUNIONECTOMY WITH HAMMERTOE RECONSTRUCTION Bilateral ~ [place]03/21/01   Medtronic PM implanted by Dr EdLeonia Reeves CARDIOVERSION N/A 12/02/2013   Procedure: CARDIOVERSION;  Surgeon: PaFay RecordsMD;  [facility] Service: Cardiovascular;  Laterality: N/A;   CATARACT EXTRACTION W/ INTRAOCULAR LENS  IMPLANT, BILATERAL Bilateral    COLONOSCOPY WITH PROPOFOL N/A 04/22/2013   Procedure: COLONOSCOPY WITH PROPOFOL;  Surgeon: MaGarlan FairMD;  Location: [facility] ENDOSCOPY;  Service: Endoscopy;  Laterality: N/A;   DILATION AND CURETTAGE OF  UTERUS  X 2   "when I was going thru menopause"   ESOPHAGOGASTRODUODENOSCOPY (EGD) WITH PROPOFOL N/A 04/22/2013   Procedure: ESOPHAGOGASTRODUODENOSCOPY (EGD) WITH PROPOFOL;  Surgeon: [name], MD;  Location: [facility] ENDOS[facility];  Service: Endoscopy;  Laterality: N/A;   HERNIA REPAIR     INCISIONAL HERNIA REPAIR     INSERT / REPLACE / REMOVE PACEMAKER  06/2007   "took out the old; put in new"   INSERT / REPLACE / REMOVE PACEMAKER  05/10/2014   MDT PPM generator change by Dr Rayann Blake   JOINT REPLACEMENT     PARTIAL NEPHRECTOMY Left 05/1974   stone disease   PERMANENT PACEMAKER GENERATOR CHANGE N/A 05/10/2014   Procedure: PERMANENT  PACEMAKER GENERATOR CHANGE;  Surgeon: Jody Grayer, MD;  Location: [facility] CATH LAB;  Service: Cardiovascular;  Laterality: N/A;   TONSILLECTOMY AND ADENOIDECTOMY  1930's   TOTAL KNEE ARTHROPLASTY Right 2001    Current Medications: Current Meds  Medication Sig   azelastine (ASTELIN) 0.1 % nasal spray USE 1 TO 2 SPRAYS INTO BOTH NOSTRILS TWICE DAILY.   Cholecalciferol (VITAMIN D) 2000 units CAPS Take 1 capsule by mouth daily.   COVID-19 mRNA vaccine, Moderna, 100 MCG/0.5ML injection Inject into the muscle.   dorzolamide (TRUSOPT) 2 % ophthalmic solution 1 drop 2 (two) times daily.   famotidine (PEPCID) 40 MG tablet Take 1 tablet (40 mg total) by mouth at bedtime.   fluticasone (FLONASE) 50 MCG/ACT nasal spray USE 2 SPRAYS EACH NOSTRIL ONCE A DAY AS NEEDED FOR ALLERGIES OR CONGESTION.   furosemide (LASIX) 40 MG tablet Take 1.5 tablets (60 mg total) by mouth daily.   gabapentin (NEURONTIN) 100 MG capsule Take 1 capsule (100 mg total) by mouth at bedtime.   latanoprost (XALATAN) 0.005 % ophthalmic solution Place 1 drop into both eyes at bedtime.   levothyroxine (SYNTHROID) 88 MCG tablet TAKE 1 TABLET BY MOUTH  DAILY BEFORE BREAKFAST   losartan (COZAAR) 100 MG tablet Take 100 mg by mouth 2 (two) times daily.   LUTEIN PO Take 25 mg by mouth daily.    Magnesium 250 MG TABS Take 1 tablet by mouth daily.   metoprolol succinate (TOPROL-XL) 100 MG 24 hr tablet Take 100 mg by mouth daily.   montelukast (SINGULAIR) 10 MG tablet TAKE 1 TABLET BY MOUTH  DAILY   Multiple Vitamins-Minerals (PRESERVISION AREDS 2) CAPS Take 1 capsule by mouth in the morning and at bedtime.    potassium chloride SA (KLOR-CON) 20 MEQ tablet Take 1 tablet (20 mEq total) by mouth 2 (two) times daily.   predniSONE (DELTASONE) 5 MG tablet Take 1 tablet (5 mg total) by mouth daily with breakfast.   Rivaroxaban (XARELTO) 15 MG TABS tablet TAKE 1 TABLET BY MOUTH  DAILY WITH SUPPER   SYMBICORT 160-4.5 MCG/ACT inhaler INHALE 2 PUFFS BY  MOUTH  INTO THE LUNGS TWO TIMES  DAILY   VENTOLIN HFA 108 (90 Base) MCG/ACT inhaler USE 2 PUFFS EVERY 6 HOURS AS NEEDED FOR SHORTNESS OF BREATH AND WHEEZING.     Allergies:   Brimonidine tartrate-timolol, Penicillins, and Breo ellipta [fluticasone furoate-vilanterol]   Social History   Socioeconomic History   Marital status: Widowed    Spouse name: Not on file   Number of children: 2   Years of education: Not on file   Highest education level: Not on file  Occupational History   Occupation: RETIRED    Employer: RETIRED    Comment: [employer] business  Tobacco Use   Smoking  status: Never   Smokeless tobacco: Never  Vaping Use   Vaping Use: Never used  Substance and Sexual Activity   Alcohol use: Yes    Alcohol/week: 7.0 standard drinks    Types: 7 Glasses of wine per week    Comment: occasionally   Drug use: No   Sexual activity: Not Currently    Partners: Male  Other Topics Concern   Not on file  Social History Narrative   Diet? Low salt, low fat      Do you drink/eat things with caffeine? yes      Marital status?                 married                   What year were you married? 1949      Do you live in a house, apartment, assisted living, condo, trailer, etc.? apartment      Is it one or more stories? 3 stories      How many persons live in your home? Just me      Do you have any pets in your home? (please list) no      Current or past profession: accounting      Do you exercise?          yes                            Type & how often? Walk, class, prescribed daily      Do you have a living will? yes      Do you have a DNR form?     yes                             If not, do you want to discuss one?      Do you have signed POA/HPOA for forms? no         Social Determinants of Health   Financial Resource Strain: Not on file  Food Insecurity: Not on file  Transportation Needs: Not on file  Physical Activity: Not on file  Stress: Not on file   Social Connections: Not on file     Family History: The patient's family history includes Breast cancer in her mother; Heart attack in her brother; Hypertension in her father; Malignant hypertension in her father; Renal Disease in her father; Stroke in her brother.  ROS:   Please see the history of present illness.     All other systems reviewed and are negative.  EKGs/Labs/Other Studies Reviewed:    The following studies were reviewed today: Echocardiogram 01/15/2019:   1. Left ventricular ejection fraction, by visual estimation, is >75%. The  left ventricle has hyperdynamic function. There is moderately increased  left ventricular hypertrophy.   2. Abnormal septal motion consistent with RV pacemaker.   3. Left ventricular diastolic Doppler parameters are indeterminate  pattern of LV diastolic filling.   4. Global right ventricle has mildly reduced systolic function.The right  ventricular size is normal. No increase in right ventricular wall  thickness.   5. Left atrial size was severely dilated.   6. Right atrial size was severely dilated.   7. Small pericardial effusion.   8. Moderate aortic valve annular calcification.   9. Moderate mitral annular calcification.  10. The mitral valve is normal in structure. Trace mitral valve  regurgitation.  11. The tricuspid valve is normal in structure. Tricuspid valve  regurgitation moderate.  12. The aortic valve is tricuspid Aortic valve regurgitation is trivial by  color flow Doppler. Moderate aortic valve stenosis.  13. There is Moderate calcification of the aortic valve.  14. There is Moderate thickening of the aortic valve.  15. Calcified leaflets with limited leaflet excursion. Visually appears to  minimally open.  16. The pulmonic valve was normal in structure. Pulmonic valve  regurgitation is mild by color flow Doppler.  17. The aortic root was not well visualized.  18. Moderately elevated pulmonary artery systolic  pressure.  19. A pacer wire is visualized in the RA and RV.  20. The inferior vena cava is normal in size with greater than 50%  respiratory variability, suggesting right atrial pressure of 3 mmHg.   Recent Labs: 08/22/2020: ALT 24; TSH 1.20 08/30/2020: B Natriuretic Peptide 391.7; BUN 29; Creatinine, Ser 0.97; Hemoglobin 12.7; Platelets 271; Potassium 3.8; Sodium 140  Recent Lipid Panel    Component Value Date/Time   CHOL 138 11/04/2017 0600   TRIG 53 11/04/2017 0600   HDL 70 11/04/2017 0600   CHOLHDL 3.1 06/25/2017 0229   VLDL 11 06/25/2017 0229   LDLCALC 58 11/04/2017 0600     Risk Assessment/Calculations:          Physical Exam:    VS:  BP 110/78   Pulse 78   Ht '5\' 3"'$  (1.6 m)   Wt 130 lb 9.6 oz (59.2 kg)   LMP 04/08/1974   SpO2 99%   BMI 23.13 kg/m     Wt Readings from Last 3 Encounters:  10/27/20 130 lb 9.6 oz (59.2 kg)  09/01/20 128 lb 9.6 oz (58.3 kg)  08/21/20 133 lb 12.8 oz (60.7 kg)     GEN:  Well nourished, well developed in no acute distress HEENT: Normal NECK: No JVD; No carotid bruits LYMPHATICS: No lymphadenopathy CARDIAC: RRR, no murmurs, rubs, gallops RESPIRATORY:  Clear to auscultation without rales, wheezing or rhonchi  ABDOMEN: Soft, non-tender, non-distended MUSCULOSKELETAL:  No edema; No deformity  SKIN: Warm and dry NEUROLOGIC:  Alert and oriented x 3 PSYCHIATRIC:  Normal affect   ASSESSMENT:    1. Permanent atrial fibrillation (HCC)   2. Cardiac pacemaker in situ   3. Chronic diastolic congestive heart failure ([place])   4. Aortic valve stenosis, etiology of cardiac valve disease unspecified   5. Essential hypertension    PLAN:    In order of problems listed above:  AV nodal ablation and pacemaker dependent atrial fibrillation - Doing well.  EP notes reviewed.  No changes made.  Chronic anticoagulation - Currently taking Xarelto without any difficulty.  Continue with current medical management.  She is on 15 mg.  CHA2DS2-VASc  score 5 with prior TIA x2.  Moderate aortic stenosis - Murmur heard on exam.  Last echocardiogram 2020.  Chronic diastolic heart failure - Maintaining fluid balance very well.  Maintaining weight.  Has battled with edema lower extremity.  Lasix has been increased to 80 mg from 60 previously in May 2022 at [place].  Prior BNP has been elevated.  Emergency department visit 08/30/2020. She is now back to '60mg'$    Relative hypotension - Her amlodipine was discontinued secondary to lower blood pressures.  Today she is 110/78.  Much improved.  If necessary, it would not be unreasonable to taper off her metoprolol to see how this makes her feel.  Remember, she has had an AV nodal  ablation and is pacemaker dependent.  Her metoprolol is not affecting her heart rate per se.  It may be assisting with her chronic diastolic heart failure with relaxing her contractile performance however.  COPD - Has been on chronic prednisone followed by Dr. [name].     Medication Adjustments/Labs and Tests Ordered: Current medicines are reviewed at length with the patient today.  Concerns regarding medicines are outlined above.  No orders of the defined types were placed in this encounter.  No orders of the defined types were placed in this encounter.   Patient Instructions  Medication Instructions:  The current medical regimen is effective;  continue present plan and medications.  *If you need a refill on your cardiac medications before your next appointment, please call your pharmacy*  Follow-Up: At [facility], you and your health needs are our priority.  As part of our continuing mission to provide you with exceptional heart care, we have created designated Provider Care Teams.  These Care Teams include your primary Cardiologist (physician) and Advanced Practice Providers (APPs -  Physician Assistants and Nurse Practitioners) who all work together to provide you with the care you need, when you need  it.  We recommend signing up for the patient portal called "MyChart".  Sign up information is provided on this After Visit Summary.  MyChart is used to connect with patients for Virtual Visits (Telemedicine).  Patients are able to view lab/test results, encounter notes, upcoming appointments, etc.  Non-urgent messages can be sent to your provider as well.   To learn more about what you can do with MyChart, go to [email or web address].    Your next appointment:   1 year(s)  The format for your next appointment:   In Person  Provider:   Candee Furbish, MD   Thank you for choosing [facility]!!     Signed, Jody Furbish, MD  10/27/2020 10:21 AM    [place]

## 2020-10-27 NOTE — Patient Instructions (Signed)
Medication Instructions:  The current medical regimen is effective;  continue present plan and medications.  *If you need a refill on your cardiac medications before your next appointment, please call your pharmacy*  Follow-Up: At [facility], you and your health needs are our priority.  As part of our continuing mission to provide you with exceptional heart care, we have created designated Provider Care Teams.  These Care Teams include your primary Cardiologist (physician) and Advanced Practice Providers (APPs -  Physician Assistants and Nurse Practitioners) who all work together to provide you with the care you need, when you need it.  We recommend signing up for the patient portal called "MyChart".  Sign up information is provided on this After Visit Summary.  MyChart is used to connect with patients for Virtual Visits (Telemedicine).  Patients are able to view lab/test results, encounter notes, upcoming appointments, etc.  Non-urgent messages can be sent to your provider as well.   To learn more about what you can do with MyChart, go to [email or web address].    Your next appointment:   1 year(s)  The format for your next appointment:   In Person  Provider:   Mark Skains, MD   Thank you for choosing [facility]!!    

## 2020-11-21 NOTE — Progress Notes (Signed)
Subjective:    Patient ID: Jody Blake, female    DOB: [birth year], 87 y.o.   MRN: [id number]  HPI F never smoker, followed for allergic rhinitis, chronic bronchitis, complicated by GERD, Hx PAfib/ pacemaker. ------------------------------------------------------------------------------------------------   05/25/20- 87 year old female never smoker followed for Allergic rhinitis, COPD mixed type, complicated by GERD,  AFib/pacemaker/ Xarelto, Glaucoma/ macular degen, dCHF, AoStenosis, CKDIII, TIA -Singulair, Proair hfa, Symbicort 160, flonase, astelin Covid vax- Flu vax-  Hosp last summer with CAP L lung, Acute flu-like illness 1/5> we called Zpak but pt said Zpak never works for her, then changed to doxy w pred taper. Had CXR at [place] then. Using rescue inhaler 1-2x/ day. Not walking for exercise due to cellulitis in leg. Using Flonase routinely for drainage. CXR 11/23/19- IMPRESSION: 1. Resolution of previously noted left lower lobe pneumonia. 2. Mild cardiomegaly. 3. Aortic atherosclerosis.  11/22/20- 87 year old female never smoker followed for Allergic rhinitis, COPD mixed type, complicated by GERD,  AFib/pacemaker/ Xarelto, Glaucoma/ macular degen, dCHF, CHB/ pacemaker, AoStenosis, CKDIII, TIA      Living at [facility] -Singulair, Ventolin hfa, Symbicort 160, flonase, astelin, prednisone 5 mg daily, Pepcid,Gabapentin 100 qhs,  Covid vax- 4 Moderna ED for cough 5/25> doxycycline,  -----COPD, productive cough, clear sputum About once/ month she wakes coughing. Doesn't recognize reflux events. Similar episode in May treated in ED with doxy. Current episode typical.  CXR 08/30/20-  Chronic changes without acute abnormality.   ROS-see HPI   + = positive Constitutional:    weight loss, night sweats, fevers, chills, fatigue, lassitude. HEENT:    headaches, difficulty swallowing, tooth/dental problems, sore throat,       sneezing, itching, ear ache, nasal congestion, post nasal  drip, snoring CV:    chest pain, orthopnea, PND, swelling in lower extremities, anasarca,                                                    dizziness, palpitations Resp:   shortness of breath with exertion or at rest.                + productive cough,   +non-productive cough, coughing up of blood.              change in color of mucus.  +wheezing.   Skin:    rash or lesions. GI:  No-   heartburn, indigestion, abdominal pain, nausea, vomiting,  GU:  MS:   joint pain, stiffness,  Neuro-     nothing unusual Psych:  change in mood or affect.  depression or anxiety.   memory loss.  Objective:  OBJ- Physical Exam General- Alert, Oriented, Affect-appropriate, Distress- none acute, + slender Skin- rash-none, lesions- none, excoriation- none Lymphadenopathy- none Head- atraumatic            Eyes- Gross vision intact, PERRLA, conjunctivae and secretions clear            Ears- + Hard of hearing            Nose- Clear, no-Septal dev, mucus, polyps, erosion, perforation             Throat- Mallampati II , mucosa clear , drainage- none, tonsils- atrophic Neck- flexible , trachea midline, no stridor , thyroid nl, carotid no bruit Chest - symmetrical excursion , unlabored  Heart/CV- RRR , no murmur , no gallop  , no rub, nl s1 s2                           - JVD- none , edema+ elastic hose, stasis changes- none, varices- none           Lung-  Wheeze -nonel, cough+ , dullness -none, rub- none           Chest wall-+ left pacemaker.  Abd-   Br/ Gen/ Rectal- Not done, not indicated Extrem- cyanosis- none, clubbing, none, atrophy- none, strength- nl Neuro- grossly intact to observation    Assessment & Plan:

## 2020-11-22 ENCOUNTER — Telehealth: Payer: Self-pay

## 2020-11-22 ENCOUNTER — Other Ambulatory Visit: Payer: Self-pay | Admitting: Adult Health

## 2020-11-22 ENCOUNTER — Encounter: Payer: Self-pay | Admitting: Internal Medicine

## 2020-11-22 ENCOUNTER — Other Ambulatory Visit: Payer: Self-pay

## 2020-11-22 ENCOUNTER — Ambulatory Visit (INDEPENDENT_AMBULATORY_CARE_PROVIDER_SITE_OTHER): Payer: Medicare Other | Admitting: Internal Medicine

## 2020-11-22 DIAGNOSIS — J441 Chronic obstructive pulmonary disease with (acute) exacerbation: Secondary | ICD-10-CM | POA: Diagnosis not present

## 2020-11-22 DIAGNOSIS — K219 Gastro-esophageal reflux disease without esophagitis: Secondary | ICD-10-CM

## 2020-11-22 MED ORDER — DOXYCYCLINE HYCLATE 100 MG PO TABS: ORAL_TABLET | ORAL | 0 refills | Status: DC

## 2020-11-22 MED ORDER — LUTEIN 20 MG PO CAPS: 20.0000 mg | ORAL_CAPSULE | Freq: Every day | ORAL | 11 refills | Status: DC

## 2020-11-22 NOTE — Telephone Encounter (Signed)
I can refill this medication. Does she want to send this to [facility] or [facility]? And is this for a 1 year supply?  When "alternative dosing" is mentioned does this mean something other than the 25 mg that she is on ?

## 2020-11-22 NOTE — Telephone Encounter (Signed)
Completed, thanks

## 2020-11-22 NOTE — Assessment & Plan Note (Signed)
Strongly suspect intermittent aspiration while asleep Plan- reflux precautions, Elevate head- consider wedge. She will ask staff at her home to see if a wedge or other head-elevating measures may help

## 2020-11-22 NOTE — Assessment & Plan Note (Signed)
Recurrent episodes while asleep strongly suggests intermittent aspiration Plan- doxycycline, reflux precautions

## 2020-11-22 NOTE — Telephone Encounter (Signed)
Patient has request refill on medication "Lutein '25mg'$ ". Patient medication needs alternative dosage. Message routed to [facility], NP for approval. Please advise.

## 2020-11-22 NOTE — Telephone Encounter (Signed)
Please refer to previous message

## 2020-11-22 NOTE — Telephone Encounter (Signed)
Please send the request to [facility] or Dr. Lyndel Safe for approval. Thanks.

## 2020-11-22 NOTE — Telephone Encounter (Signed)
Refill request was from [facility]. You can send 1 year supply. Patient takes '25mg'$  however that isnt an option so medication needs alternative dosage. Message routed back to [facility], NP. Please Advise.

## 2020-11-22 NOTE — Patient Instructions (Addendum)
Script for doxycycline antibiotic sent to [facility]  I suspect you are aspirating stomach juice that refluxes up from your stomach at night while you sleep. We can try to prevent this by having you sleep with your head elevated more at night.  You can ask the staff at [facility] what other people there do. Usually we try putting one brick under each of the head legs of the bed, to raise the head end of the bed frame about 2 inches. Sometimes you can find a foam wedge at a mattress store, [employer] or similar place.  Please call if we can help

## 2020-12-07 ENCOUNTER — Ambulatory Visit (INDEPENDENT_AMBULATORY_CARE_PROVIDER_SITE_OTHER): Payer: Medicare Other

## 2020-12-07 DIAGNOSIS — I495 Sick sinus syndrome: Secondary | ICD-10-CM

## 2020-12-08 ENCOUNTER — Other Ambulatory Visit: Payer: Self-pay | Admitting: Adult Health

## 2020-12-08 MED ORDER — FAMOTIDINE 40 MG PO TABS: 40.0000 mg | ORAL_TABLET | Freq: Every day | ORAL | 6 refills | Status: DC

## 2020-12-12 ENCOUNTER — Other Ambulatory Visit: Payer: Self-pay | Admitting: Internal Medicine

## 2020-12-12 LAB — CUP PACEART REMOTE DEVICE CHECK
Battery Impedance: 523 Ohm
Battery Remaining Longevity: 92 mo
Battery Voltage: 2.79 V
Brady Statistic RV Percent Paced: 98 %
Date Time Interrogation Session: 20220902115810
Implantable Lead Implant Date: 20090318
Implantable Lead Implant Date: 20090318
Implantable Lead Location: 753859
Implantable Lead Location: 753860
Implantable Lead Model: 5076
Implantable Lead Model: 5092
Implantable Pulse Generator Implant Date: 20160202
Lead Channel Impedance Value: 67 Ohm
Lead Channel Impedance Value: 684 Ohm
Lead Channel Pacing Threshold Amplitude: 1.125 V
Lead Channel Pacing Threshold Pulse Width: 0.4 ms
Lead Channel Setting Pacing Amplitude: 2.5 V
Lead Channel Setting Pacing Pulse Width: 0.4 ms
Lead Channel Setting Sensing Sensitivity: 4 mV

## 2020-12-18 ENCOUNTER — Non-Acute Institutional Stay: Payer: Medicare Other | Admitting: Adult Health

## 2020-12-18 ENCOUNTER — Other Ambulatory Visit: Payer: Self-pay

## 2020-12-18 ENCOUNTER — Encounter: Payer: Self-pay | Admitting: Adult Health

## 2020-12-18 VITALS — BP 138/92 | HR 76 | Temp 96.4°F | Ht 63.0 in | Wt 129.2 lb

## 2020-12-18 DIAGNOSIS — J301 Allergic rhinitis due to pollen: Secondary | ICD-10-CM

## 2020-12-18 DIAGNOSIS — I5032 Chronic diastolic (congestive) heart failure: Secondary | ICD-10-CM | POA: Diagnosis not present

## 2020-12-18 DIAGNOSIS — K219 Gastro-esophageal reflux disease without esophagitis: Secondary | ICD-10-CM | POA: Diagnosis not present

## 2020-12-18 DIAGNOSIS — I495 Sick sinus syndrome: Secondary | ICD-10-CM

## 2020-12-18 DIAGNOSIS — I10 Essential (primary) hypertension: Secondary | ICD-10-CM | POA: Diagnosis not present

## 2020-12-18 DIAGNOSIS — I4821 Permanent atrial fibrillation: Secondary | ICD-10-CM

## 2020-12-18 DIAGNOSIS — N1831 Chronic kidney disease, stage 3a: Secondary | ICD-10-CM | POA: Diagnosis not present

## 2020-12-18 DIAGNOSIS — E039 Hypothyroidism, unspecified: Secondary | ICD-10-CM | POA: Diagnosis not present

## 2020-12-18 MED ORDER — LORATADINE 10 MG PO TABS: 10.0000 mg | ORAL_TABLET | Freq: Every day | ORAL | 11 refills | Status: DC

## 2020-12-18 MED ORDER — METOPROLOL SUCCINATE ER 50 MG PO TB24: 75.0000 mg | ORAL_TABLET | Freq: Every day | ORAL | 3 refills | Status: DC

## 2020-12-18 NOTE — Patient Instructions (Signed)
Try taking metoprolol 75 mg and monitor yourself for palpitations and shortness of breath. Let us know if you develop these symptoms  Try Claritin 10 mg daily for runny nose and cough.   Monitor your weight daily. Need scale with big numbers.   You need your labs checked this week. The nurse will call you to set this up.

## 2020-12-18 NOTE — Progress Notes (Addendum)
Location: [facility]  POS:  clinic  Provider:  Cindi Carbon, [place] [phone]   Code Status:  Goals of Care:  Advanced Directives 12/18/2020  Does Patient Have a Medical Advance Directive? Yes  Type of Advance Directive Out of facility DNR (pink MOST or yellow form);Healthcare Power of Attorney  Does patient want to make changes to medical advance directive? No - Patient declined  Copy of Marcus in Chart? Yes - validated most recent copy scanned in chart (See row information)  Would patient like information on creating a medical advance directive? -  Pre-existing out of facility DNR order (yellow form or pink MOST form) Yellow form placed in chart (order not valid for inpatient use)     Chief Complaint  Patient presents with   Medical Management of Chronic Issues   Quality Metric Gaps    Dexa scan, flu shot    HPI: Patient is a 87 y.o. female seen today for medical management of chronic diseases.  PMH significant for AS, CHB with pacemaker, COPD, CHF, Hypothyroid, afib, bronchitis, HTN, CKD, MD, and GERD.   Tries to walk 4-5 halls per day. Does feel short of breath at the end of her walks. She does not have SOB at night but does not feel well when she wakes up in the morning. She feels like she aches all over in the morning and does not feel quite right but through the day feels better. Sleeping well.   She has her HOB raised and this has helped with a cough. Still has runny nose and throat clearing.   Wt Readings from Last 3 Encounters:  12/18/20 129 lb 3.2 oz (58.6 kg)  11/22/20 128 lb 9.6 oz (58.3 kg)  10/27/20 130 lb 9.6 oz (59.2 kg)  No change in edema, weight.    Past Medical History:  Diagnosis Date   Anemia    Aortic stenosis    a. Echo 09/06/12 EF 55-60%, no WMA, G2DD, Ao valve sclerosis w/ mod stenosis, LA mildly dilated, PA pressure 30mHg   Asthmatic bronchitis    Complete heart block (HCC)    s/p permanent  pacemaker 06/27/1999 (Battery change 06/2007 and 2016).  s/p AV nodal ablation by Dr ARayann Heman2016.   COPD (chronic obstructive pulmonary disease) (HCC)    Dr. YAnnamaria Boots  DDD (degenerative disc disease)    Diastolic dysfunction    a. Echo 09/06/12 EF 55-60%, no WMA, G2DD, Ao valve sclerosis w/ mod stenosis, LA mildly dilated, PA pressure 442mg   Diverticulosis    DVT (deep vein thrombosis) in pregnancy 1954   a. LLE   Hypertension    Hypothyroidism    on medication   Macular degeneration    Dr. KuEliezer Bottom Osteoarthrosis, unspecified whether generalized or localized, other specified sites    [place]   Stopped flecainide, on amiodarone but still has bouts of A FIB (mostly in mornings).    Pure hypercholesterolemia    Staghorn calculus    Left    Past Surgical History:  Procedure Laterality Date   APPENDECTOMY  ~ 1941   AV NODE ABLATION  05/10/2014   AV NODE ABLATION N/A 05/10/2014   Procedure: AV NODE ABLATION;  Surgeon: JaThompson GrayerMD;  [facility];  Service: Cardiovascular;  Laterality: N/A;   BUNIONECTOMY WITH HAMMERTOE RECONSTRUCTION Bilateral ~ [place]03/21/01   Medtronic PM implanted by Dr EdLeonia Reeves  CARDIOVERSION N/A 12/02/2013   Procedure: CARDIOVERSION;  Surgeon: [name], MD;  Location: [facility] ENDOSCOPY;  Service: Cardiovascular;  Laterality: N/A;   CATARACT EXTRACTION W/ INTRAOCULAR LENS  IMPLANT, BILATERAL Bilateral    COLONOSCOPY WITH PROPOFOL N/A 04/22/2013   Procedure: COLONOSCOPY WITH PROPOFOL;  Surgeon: [name], MD;  Location: [facility] ENDOSCOPY;  Service: Endoscopy;  Laterality: N/A;   DILATION AND CURETTAGE OF UTERUS  X 2   "when I was going thru menopause"   ESOPHAGOGASTRODUODENOSCOPY (EGD) WITH PROPOFOL N/A 04/22/2013   Procedure: ESOPHAGOGASTRODUODENOSCOPY (EGD) WITH PROPOFOL;  Surgeon: [name], MD;  Location: [facility] ENDOSCOPY;  Service: Endoscopy;  Laterality: N/A;   HERNIA REPAIR      INCISIONAL HERNIA REPAIR     INSERT / REPLACE / REMOVE PACEMAKER  06/2007   "took out the old; put in new"   INSERT / REPLACE / REMOVE PACEMAKER  05/10/2014   MDT PPM generator change by Dr [name]   JOINT REPLACEMENT     PARTIAL NEPHRECTOMY Left 05/1974   stone disease   PERMANENT PACEMAKER GENERATOR CHANGE N/A 05/10/2014   Procedure: PERMANENT PACEMAKER GENERATOR CHANGE;  Surgeon: Thompson Grayer, MD;  [facility] C[facility] LAB;  Service: Cardiovascular;  Laterality: N/A;   TONSILLECTOMY AND ADENOIDECTOMY  1930's   TOTAL KNEE ARTHROPLASTY Right 2001    Allergies  Allergen Reactions   Brimonidine Tartrate-Timolol Other (See Comments)    REACTION: systemic malaise amigen eye drop   Penicillins Hives   Breo Ellipta [Fluticasone Furoate-Vilanterol] Other (See Comments)    Ran blood pressure up Increased blood pressure    Outpatient Encounter Medications as of 12/18/2020  Medication Sig   azelastine (ASTELIN) 0.1 % nasal spray USE 1 TO 2 SPRAYS INTO BOTH NOSTRILS TWICE DAILY.   Cholecalciferol (VITAMIN D) 125 MCG (5000 UT) CAPS Take 1 capsule by mouth daily.   famotidine (PEPCID) 40 MG tablet Take 1 tablet (40 mg total) by mouth at bedtime.   fluticasone (FLONASE) 50 MCG/ACT nasal spray USE 2 SPRAYS EACH NOSTRIL ONCE A DAY AS NEEDED FOR ALLERGIES OR CONGESTION.   furosemide (LASIX) 40 MG tablet TAKE 1 AND 1/2 TABLETS ('60MG'$ ) BY MOUTH ONCE DAILY   gabapentin (NEURONTIN) 100 MG capsule Take 1 capsule (100 mg total) by mouth at bedtime.   latanoprost (XALATAN) 0.005 % ophthalmic solution Place 1 drop into both eyes at bedtime.   levothyroxine (SYNTHROID) 88 MCG tablet TAKE 1 TABLET BY MOUTH  DAILY BEFORE BREAKFAST   losartan (COZAAR) 100 MG tablet Take 100 mg by mouth 2 (two) times daily.   Lutein 20 MG CAPS Take 1 capsule (20 mg total) by mouth daily.   Magnesium 250 MG TABS Take 1 tablet by mouth daily.   metoprolol succinate (TOPROL-XL) 100 MG 24 hr tablet Take 100 mg by mouth daily.   montelukast  (SINGULAIR) 10 MG tablet TAKE 1 TABLET BY MOUTH  DAILY   Multiple Vitamins-Minerals (PRESERVISION AREDS 2) CAPS Take 1 capsule by mouth in the morning and at bedtime.    potassium chloride (MICRO-K) 10 MEQ CR capsule Take 20 mEq by mouth daily.   predniSONE (DELTASONE) 5 MG tablet Take 1 tablet (5 mg total) by mouth daily with breakfast.   Rivaroxaban (XARELTO) 15 MG TABS tablet TAKE 1 TABLET BY MOUTH  DAILY WITH SUPPER   SYMBICORT 160-4.5 MCG/ACT inhaler INHALE 2 PUFFS BY MOUTH  INTO THE LUNGS TWO TIMES  DAILY   VENTOLIN HFA 108 (90 Base) MCG/ACT inhaler USE 2 PUFFS EVERY 6  HOURS AS NEEDED FOR SHORTNESS OF BREATH AND WHEEZING.   [DISCONTINUED] amLODipine (NORVASC) 2.5 MG tablet Take 1 tablet (2.5 mg total) by mouth daily. (Patient not taking: No sig reported)   [DISCONTINUED] dorzolamide (TRUSOPT) 2 % ophthalmic solution 1 drop 2 (two) times daily.   [DISCONTINUED] doxycycline (VIBRA-TABS) 100 MG tablet 2 today then one daily   [DISCONTINUED] potassium chloride SA (KLOR-CON) 20 MEQ tablet Take 1 tablet (20 mEq total) by mouth 2 (two) times daily.   No facility-administered encounter medications on file as of 12/18/2020.    Review of Systems:  Review of Systems  Constitutional:  Negative for activity change, appetite change, chills, diaphoresis, fatigue, fever and unexpected weight change.  HENT:  Positive for congestion, hearing loss, postnasal drip, rhinorrhea and voice change. Negative for ear discharge, ear pain, facial swelling, sinus pressure, sinus pain, sneezing, sore throat and trouble swallowing.   Respiratory:  Negative for cough (Improved), shortness of breath (with exercise) and wheezing.   Cardiovascular:  Negative for chest pain, palpitations and leg swelling.  Gastrointestinal:  Negative for abdominal distention, abdominal pain, constipation and diarrhea.  Genitourinary:  Negative for difficulty urinating and dysuria.  Musculoskeletal:  Negative for arthralgias, back pain, gait  problem, joint swelling and myalgias.  Neurological:  Negative for dizziness, tremors, seizures, syncope, facial asymmetry, speech difficulty, weakness, light-headedness, numbness and headaches.  Psychiatric/Behavioral:  Negative for agitation, behavioral problems and confusion.    Health Maintenance  Topic Date Due   DEXA SCAN  Never done   INFLUENZA VACCINE  11/06/2020   COVID-19 Vaccine (5 - Booster for Moderna series) 12/19/2020   TETANUS/TDAP  02/23/2029   PNA vac Low Risk Adult  Completed   Zoster Vaccines- Shingrix  Completed   HPV VACCINES  Aged Out    Physical Exam: Vitals:   12/18/20 1410  BP: (!) 138/92  Pulse: 76  Temp: (!) 96.4 F (35.8 C)  SpO2: 99%  Weight: 129 lb 3.2 oz (58.6 kg)  Height: '5\' 3"'$  (1.6 m)   Body mass index is 22.89 kg/m. Physical Exam Vitals and nursing note reviewed.  Constitutional:      General: She is not in acute distress.    Appearance: She is not diaphoretic.  HENT:     Head: Normocephalic and atraumatic.     Right Ear: Tympanic membrane, ear canal and external ear normal.     Left Ear: Tympanic membrane, ear canal and external ear normal.     Nose: Congestion and rhinorrhea present.     Mouth/Throat:     Mouth: Mucous membranes are moist.     Pharynx: Posterior oropharyngeal erythema present.     Comments: Clear and white drainage in the pharynx Eyes:     General:        Right eye: No discharge.        Left eye: No discharge.     Conjunctiva/sclera: Conjunctivae normal.     Pupils: Pupils are equal, round, and reactive to light.  Neck:     Thyroid: No thyromegaly.     Vascular: No carotid bruit or JVD.  Cardiovascular:     Rate and Rhythm: Normal rate and regular rhythm.     Heart sounds: Normal heart sounds. No murmur heard. Pulmonary:     Effort: Pulmonary effort is normal. No respiratory distress.     Breath sounds: Normal breath sounds. No stridor.  Abdominal:     General: Bowel sounds are normal.     Palpations:  Abdomen is  soft.  Musculoskeletal:     Cervical back: No rigidity. No muscular tenderness.     Right lower leg: No edema (+1).     Left lower leg: No edema (+1).  Lymphadenopathy:     Cervical: No cervical adenopathy.  Skin:    General: Skin is warm and dry.  Neurological:     General: No focal deficit present.     Mental Status: She is alert and oriented to person, place, and time. Mental status is at baseline.     Cranial Nerves: No cranial nerve deficit.  Psychiatric:        Mood and Affect: Mood normal.    Labs reviewed: Basic Metabolic Panel: Recent Labs    08/22/20 0000 08/30/20 1039  NA 143 140  K 3.3* 3.8  CL 101 104  CO2 28* 26  GLUCOSE  --  106*  BUN 27* 29*  CREATININE 1.0 0.97  CALCIUM 9.8 9.7  TSH 1.20  --    Liver Function Tests: Recent Labs    08/22/20 0000  AST 27  ALT 24  ALKPHOS 71  ALBUMIN 4.4   No results for input(s): LIPASE, AMYLASE in the last 8760 hours. No results for input(s): AMMONIA in the last 8760 hours. CBC: Recent Labs    08/22/20 0000 08/30/20 1039  WBC 7.1 9.3  NEUTROABS  --  7.4  HGB 12.9 12.7  HCT 38 39.1  MCV  --  97.5  PLT 226 271   Lipid Panel: No results for input(s): CHOL, HDL, LDLCALC, TRIG, CHOLHDL, LDLDIRECT in the last 8760 hours. Lab Results  Component Value Date   HGBA1C 5.7 (H) 06/25/2017    Procedures since last visit: CUP [place]  Result Date: 12/12/2020 Scheduled remote reviewed. Normal device function.  Next remote 91 days. Kathy Breach, RN, CCDS, [employer] Scheduled remote reviewed. Normal device function.  Next remote 91 days. Kathy Breach, RN, CCDS, [employer]   Assessment/Plan  1. Seasonal allergic rhinitis due to pollen  - loratadine (CLARITIN) 10 MG tablet; Take 1 tablet (10 mg total) by mouth daily.  Dispense: 30 tablet; Refill: 11  2. Permanent atrial fibrillation [facility]) Reviewed note by Dr Marlou Porch. Likely does not need high dose metoprolol. Will  try dose reduction and monitor for sob or palpitations - metoprolol succinate (TOPROL-XL) 50 MG 24 hr tablet; Take 1.5 tablets (75 mg total) by mouth daily. Take with or immediately following a meal.  Dispense: 135 tablet; Refill: 3  3. Sick sinus syndrome [facility]) S/p pacemaker  4. Essential hypertension Controlled off norvasc   5. Chronic diastolic congestive heart failure (HCC) Compensated and doing well at this time with lasix 60 mg daily  6. Hypothyroidism, unspecified type Lab Results  Component Value Date   TSH 1.20 08/22/2020  Continue Synthroid 88 mcg qd   7. Gastroesophageal reflux disease, unspecified whether esophagitis present Continue raised bed and pepcid each day  8. Stage 3a chronic kidney disease (HCC) Continue to periodically monitor BMP and avoid nephrotoxic agents Check BMP   Labs/tests ordered:  * No order type specified *CBC BMP this week Next appt:  F/U in 3 months with Dr Lyndel Safe      Total time 25mn:  time greater than 50% of total time spent doing pt counseling and coordination of care

## 2020-12-19 NOTE — Progress Notes (Signed)
Remote pacemaker transmission.   

## 2020-12-21 DIAGNOSIS — I4821 Permanent atrial fibrillation: Secondary | ICD-10-CM | POA: Diagnosis not present

## 2020-12-21 DIAGNOSIS — N183 Chronic kidney disease, stage 3 unspecified: Secondary | ICD-10-CM | POA: Diagnosis not present

## 2020-12-21 LAB — COMPREHENSIVE METABOLIC PANEL
Albumin: 4.3 (ref 3.5–5.0)
Calcium: 9.6 (ref 8.7–10.7)
Globulin: 2.1

## 2020-12-21 LAB — BASIC METABOLIC PANEL
BUN: 32 — AB (ref 4–21)
CO2: 29 — AB (ref 13–22)
Chloride: 105 (ref 99–108)
Creatinine: 1.1 (ref 0.5–1.1)
Glucose: 106
Potassium: 4 (ref 3.4–5.3)
Sodium: 144 (ref 137–147)

## 2020-12-21 LAB — CBC AND DIFFERENTIAL
HCT: 40 (ref 36–46)
Hemoglobin: 13.3 (ref 12.0–16.0)
Platelets: 213 (ref 150–399)
WBC: 6

## 2020-12-21 LAB — HEPATIC FUNCTION PANEL
ALT: 18 (ref 7–35)
AST: 24 (ref 13–35)
Alkaline Phosphatase: 72 (ref 25–125)
Bilirubin, Total: 0.5

## 2020-12-21 LAB — CBC: RBC: 4.17 (ref 3.87–5.11)

## 2020-12-28 ENCOUNTER — Other Ambulatory Visit: Payer: Self-pay | Admitting: Adult Health

## 2020-12-28 MED ORDER — GABAPENTIN 100 MG PO CAPS: 100.0000 mg | ORAL_CAPSULE | Freq: Every day | ORAL | 3 refills | Status: DC

## 2020-12-28 MED ORDER — MONTELUKAST SODIUM 10 MG PO TABS: 10.0000 mg | ORAL_TABLET | Freq: Every day | ORAL | 3 refills | Status: DC

## 2021-01-02 DIAGNOSIS — H35371 Puckering of macula, right eye: Secondary | ICD-10-CM | POA: Diagnosis not present

## 2021-01-02 DIAGNOSIS — H35363 Drusen (degenerative) of macula, bilateral: Secondary | ICD-10-CM | POA: Diagnosis not present

## 2021-01-02 DIAGNOSIS — H353122 Nonexudative age-related macular degeneration, left eye, intermediate dry stage: Secondary | ICD-10-CM | POA: Diagnosis not present

## 2021-01-02 DIAGNOSIS — D3132 Benign neoplasm of left choroid: Secondary | ICD-10-CM | POA: Diagnosis not present

## 2021-01-02 DIAGNOSIS — H401131 Primary open-angle glaucoma, bilateral, mild stage: Secondary | ICD-10-CM | POA: Diagnosis not present

## 2021-01-02 DIAGNOSIS — H353114 Nonexudative age-related macular degeneration, right eye, advanced atrophic with subfoveal involvement: Secondary | ICD-10-CM | POA: Diagnosis not present

## 2021-01-02 DIAGNOSIS — Z961 Presence of intraocular lens: Secondary | ICD-10-CM | POA: Diagnosis not present

## 2021-01-04 ENCOUNTER — Other Ambulatory Visit: Payer: Self-pay | Admitting: Internal Medicine

## 2021-01-16 ENCOUNTER — Other Ambulatory Visit: Payer: Self-pay | Admitting: Orthopedic Surgery

## 2021-01-16 DIAGNOSIS — I5032 Chronic diastolic (congestive) heart failure: Secondary | ICD-10-CM

## 2021-01-16 MED ORDER — POTASSIUM CHLORIDE ER 10 MEQ PO CPCR: 20.0000 meq | ORAL_CAPSULE | Freq: Every day | ORAL | 3 refills | Status: DC

## 2021-01-26 DIAGNOSIS — Z23 Encounter for immunization: Secondary | ICD-10-CM | POA: Diagnosis not present

## 2021-01-29 ENCOUNTER — Telehealth: Payer: Self-pay | Admitting: Adult Health

## 2021-01-29 NOTE — Telephone Encounter (Signed)
Clinic nurse called to report that Jody Blake is having some sob with exertion which is mild. She has some increased edema in her feet and 2-3 lb weight gain. Ordered Lasix 40mg  x1, if no improving return to the clinic.

## 2021-01-30 ENCOUNTER — Encounter: Payer: Self-pay | Admitting: Orthopedic Surgery

## 2021-01-30 DIAGNOSIS — R062 Wheezing: Secondary | ICD-10-CM | POA: Diagnosis not present

## 2021-02-01 ENCOUNTER — Other Ambulatory Visit: Payer: Self-pay | Admitting: Adult Health

## 2021-02-01 DIAGNOSIS — I5032 Chronic diastolic (congestive) heart failure: Secondary | ICD-10-CM

## 2021-02-01 MED ORDER — POTASSIUM CHLORIDE ER 10 MEQ PO CPCR: 20.0000 meq | ORAL_CAPSULE | Freq: Two times a day (BID) | ORAL | 0 refills | Status: DC

## 2021-02-01 NOTE — Addendum Note (Signed)
Addended by: Barnie Mort on: 02/01/2021 10:03 AM   Modules accepted: Orders

## 2021-02-01 NOTE — Progress Notes (Signed)
Nurse contacted me concerned that Jody Blake was taking kdur once daily but her lasix had been increased for 3 days due to fluid overload. I will see her on 10/31.  Ok to increase kdur to bid until seen.

## 2021-02-05 ENCOUNTER — Non-Acute Institutional Stay: Payer: Medicare Other | Admitting: Adult Health

## 2021-02-05 ENCOUNTER — Encounter: Payer: Self-pay | Admitting: Adult Health

## 2021-02-05 ENCOUNTER — Other Ambulatory Visit: Payer: Self-pay

## 2021-02-05 VITALS — BP 128/92 | HR 79 | Temp 96.1°F | Ht 63.0 in | Wt 130.0 lb

## 2021-02-05 DIAGNOSIS — J302 Other seasonal allergic rhinitis: Secondary | ICD-10-CM | POA: Diagnosis not present

## 2021-02-05 DIAGNOSIS — J411 Mucopurulent chronic bronchitis: Secondary | ICD-10-CM | POA: Diagnosis not present

## 2021-02-05 DIAGNOSIS — J3089 Other allergic rhinitis: Secondary | ICD-10-CM | POA: Diagnosis not present

## 2021-02-05 DIAGNOSIS — I5033 Acute on chronic diastolic (congestive) heart failure: Secondary | ICD-10-CM | POA: Diagnosis not present

## 2021-02-05 NOTE — Progress Notes (Signed)
Location:  [facility]up[facility] psychologist of Service:  Clinic (12) Provider:   Cindi Carbon, [place] [phone]   [name], MD  Patient Care Team: [name], MD as PCP - General (Internal Medicine) Jerline Pain, MD as PCP - Cardiology (Cardiology) Thompson Grayer, MD as Consulting Physician (Cardiology) Deneise Lever, MD as Consulting Physician (Pulmonary Disease) Rutherford Guys, MD as Consulting Physician (Ophthalmology) [name], MD as Consulting Physician (Gastroenterology) Gaynelle Arabian, MD as Consulting Physician (Orthopedic Surgery)  Extended Emergency Contact Information Primary Emergency Contact: [name] of [employer] [employer] Phone: [phone] Mobile Phone: [phone] Relation: Son Secondary Emergency Contact: Hunt,Cheryl  [place] Mobile Phone: [phone] Relation: Daughter  Code Status:   Goals of care: Advanced Directive information Advanced Directives 02/05/2021  Does Patient Have a Medical Advance Directive? Yes  Type of Advance Directive Out of facility DNR (pink MOST or yellow form);Healthcare Power of Attorney  Does patient want to make changes to medical advance directive? No - Patient declined  Copy of [place] in Chart? Yes - validated most recent copy scanned in chart (See row information)  Would patient like information on creating a medical advance directive? -  Pre-existing out of facility DNR order (yellow form or pink MOST form) Yellow form placed in chart (order not valid for inpatient use)     Chief Complaint  Patient presents with   Medical Management of Chronic Issues    HPI:  Pt is a 87 y.o. female seen today for an acute visit for f/u regarding sob and wheezing.   Pt was given increased lasix dosing for three days last week  due to the above complaint with weight gain. She feels much better and no longer has sob on exertion  or wheezing. She did use her albuterol during this time and felt relief.   01/30/21: CXR showed mild venous congestion   Weighed 127 lbs this morning per resident but is 130 this afternoon after eating.  Was 128-129 prior to increased lasix dosing.   Continues to report chronic post nasal drip. Better but still present with claritin.  Also report chronic sob when she wake up in the morning and feels tired and achy, then feels better as the day goes on  Past Medical History:  Diagnosis Date   Anemia    Aortic stenosis    a. Echo 09/06/12 EF 55-60%, no WMA, G2DD, Ao valve sclerosis w/ mod stenosis, LA mildly dilated, PA pressure 60mmHg   Asthmatic bronchitis    Complete heart block ([place])    s/p permanent pacemaker 06/27/1999 (Battery change 06/2007 and 2016).  s/p AV nodal ablation by Dr [name] 2016.   COPD (chronic obstructive pulmonary disease) (HCC)    Dr. [name]   DDD (degenerative disc disease)    Diastolic dysfunction    a. Echo 09/06/12 EF 55-60%, no WMA, G2DD, Ao valve sclerosis w/ mod stenosis, LA mildly dilated, PA pressure 55mmHg   Diverticulosis    DVT (deep vein thrombosis) in pregnancy 1954   a. LLE   Hypertension    Hypothyroidism    on medication   Macular degeneration    Dr. [name]   Osteoarthrosis, unspecified whether generalized or localized, other specified sites    PAF (paroxysmal atrial fibrillation) ([place])    Stopped flecainide, on amiodarone but still has bouts of A FIB (mostly in mornings).    Pure hypercholesterolemia    Staghorn  calculus    Left   Past Surgical History:  Procedure Laterality Date   APPENDECTOMY  ~ 1941   AV NODE ABLATION  05/10/2014   AV NODE ABLATION N/A 05/10/2014   Procedure: AV NODE ABLATION;  Surgeon: Thompson Grayer, MD;  Location: [facility] CATH LAB;  Service: Cardiovascular;  Laterality: N/A;   BUNIONECTOMY WITH HAMMERTOE RECONSTRUCTION Bilateral ~ [place]  06/27/99   Medtronic PM implanted by Dr [name]    CARDIOVERSION N/A 12/02/2013   Procedure: CARDIOVERSION;  Surgeon: [name], MD;  Location: [place];  Service: Cardiovascular;  Laterality: N/A;   CATARACT EXTRACTION W/ INTRAOCULAR LENS  IMPLANT, BILATERAL Bilateral    COLONOSCOPY WITH PROPOFOL N/A 04/22/2013   Procedure: COLONOSCOPY WITH PROPOFOL;  Surgeon: [name], MD;  Location: [facility] ENDOSCOPY;  Service: Endoscopy;  Laterality: N/A;   DILATION AND CURETTAGE OF UTERUS  X 2   "when I was going thru menopause"   ESOPHAGOGASTRODUODENOSCOPY (EGD) WITH PROPOFOL N/A 04/22/2013   Procedure: ESOPHAGOGASTRODUODENOSCOPY (EGD) WITH PROPOFOL;  Surgeon: [name], MD;  Location: [facility] ENDOSCOPY;  Service: Endoscopy;  Laterality: N/A;   HERNIA REPAIR     INCISIONAL HERNIA REPAIR     INSERT / REPLACE / REMOVE PACEMAKER  06/2007   "took out the old; put in new"   INSERT / REPLACE / REMOVE PACEMAKER  05/10/2014   MDT PPM generator change by Dr [name]   JOINT REPLACEMENT     PARTIAL NEPHRECTOMY Left 05/1974   stone disease   PERMANENT PACEMAKER GENERATOR CHANGE N/A 05/10/2014   Procedure: PERMANENT PACEMAKER GENERATOR CHANGE;  Surgeon: Thompson Grayer, MD;  Location: [facility] CATH LAB;  Service: Cardiovascular;  Laterality: N/A;   TONSILLECTOMY AND ADENOIDECTOMY  1930's   TOTAL KNEE ARTHROPLASTY Right 2001    Allergies  Allergen Reactions   Brimonidine Tartrate-Timolol Other (See Comments)    REACTION: systemic malaise amigen eye drop   Penicillins Hives   Breo Ellipta [Fluticasone Furoate-Vilanterol] Other (See Comments)    Ran blood pressure up Increased blood pressure    Outpatient Encounter Medications as of 02/05/2021  Medication Sig   Azelastine HCl 137 MCG/SPRAY SOLN USE 1 TO 2 SPRAYS INTO BOTH NOSTRILS TWICE DAILY.   Cholecalciferol (VITAMIN D) 125 MCG (5000 UT) CAPS Take 1 capsule by mouth daily.   famotidine (PEPCID) 40 MG tablet Take 1 tablet (40 mg total) by mouth at bedtime.   fluticasone (FLONASE) 50 MCG/ACT nasal spray USE 2  SPRAYS EACH NOSTRIL ONCE A DAY AS NEEDED FOR ALLERGIES OR CONGESTION.   furosemide (LASIX) 40 MG tablet TAKE 1 AND 1/2 TABLETS (60MG ) BY MOUTH ONCE DAILY   gabapentin (NEURONTIN) 100 MG capsule Take 1 capsule (100 mg total) by mouth at bedtime.   latanoprost (XALATAN) 0.005 % ophthalmic solution Place 1 drop into both eyes at bedtime.   levothyroxine (SYNTHROID) 88 MCG tablet TAKE 1 TABLET BY MOUTH  DAILY BEFORE BREAKFAST   loratadine (CLARITIN) 10 MG tablet Take 1 tablet (10 mg total) by mouth daily.   losartan (COZAAR) 100 MG tablet Take 100 mg by mouth 2 (two) times daily.   Lutein 20 MG CAPS Take 1 capsule (20 mg total) by mouth daily.   Magnesium 250 MG TABS Take 1 tablet by mouth daily.   metoprolol succinate (TOPROL-XL) 50 MG 24 hr tablet Take 1.5 tablets (75 mg total) by mouth daily. Take with or immediately following a meal.   montelukast (SINGULAIR) 10 MG tablet Take 1 tablet (  10 mg total) by mouth daily.   Multiple Vitamins-Minerals (PRESERVISION AREDS 2) CAPS Take 1 capsule by mouth in the morning and at bedtime.    potassium chloride (MICRO-K) 10 MEQ CR capsule Take 2 capsules (20 mEq total) by mouth 2 (two) times daily for 3 days.   predniSONE (DELTASONE) 5 MG tablet Take 1 tablet (5 mg total) by mouth daily with breakfast.   Rivaroxaban (XARELTO) 15 MG TABS tablet TAKE 1 TABLET BY MOUTH  DAILY WITH SUPPER   SYMBICORT 160-4.5 MCG/ACT inhaler INHALE 2 PUFFS BY MOUTH  INTO THE LUNGS TWO TIMES  DAILY   VENTOLIN HFA 108 (90 Base) MCG/ACT inhaler USE 2 PUFFS EVERY 6 HOURS AS NEEDED FOR SHORTNESS OF BREATH AND WHEEZING.   No facility-administered encounter medications on file as of 02/05/2021.    Review of Systems  Constitutional:  Negative for activity change, appetite change, chills, diaphoresis, fatigue, fever and unexpected weight change.  HENT:  Positive for congestion, postnasal drip and rhinorrhea. Negative for sinus pressure, sinus pain, sneezing, sore throat, tinnitus and  trouble swallowing.   Respiratory:  Positive for shortness of breath (in the morning). Negative for cough and wheezing.   Cardiovascular:  Positive for leg swelling. Negative for chest pain and palpitations.  Gastrointestinal:  Negative for abdominal distention, abdominal pain, constipation and diarrhea.  Genitourinary:  Negative for difficulty urinating and dysuria.  Musculoskeletal:  Negative for arthralgias, back pain, gait problem, joint swelling and myalgias.  Neurological:  Negative for dizziness, tremors, seizures, syncope, facial asymmetry, speech difficulty, weakness, light-headedness, numbness and headaches.  Psychiatric/Behavioral:  Negative for agitation, behavioral problems and confusion.    Immunization History  Administered Date(s) Administered   Influenza Split 01/07/2011   Influenza Whole 01/16/2009, 01/23/2010   Influenza, High Dose Seasonal PF 01/06/2013, 01/15/2019, 02/04/2020   Influenza,inj,Quad PF,6+ Mos 02/17/2015, 01/30/2018   Influenza,inj,quad, With Preservative 01/06/2017   Influenza-Unspecified 01/06/2014, 02/01/2016, 02/04/2020   Moderna SARS-COV2 Booster Vaccination 08/18/2020   Moderna Sars-Covid-2 Vaccination 04/20/2019, 05/18/2019, 02/22/2020   Pneumococcal Conjugate-13 02/24/2019   Pneumococcal-Unspecified 11/23/2013   Tdap 02/24/2019   Zoster Recombinat (Shingrix) 03/13/2017, 05/20/2017   Pertinent  Health Maintenance Due  Topic Date Due   INFLUENZA VACCINE  11/06/2020   DEXA SCAN  Discontinued   Fall Risk 08/16/2020 08/30/2020 09/01/2020 12/18/2020 02/05/2021  Falls in the past year? 0 - 0 0 0  Was there an injury with Fall? - - - - 0  Fall Risk Category Calculator - - - - 0  Fall Risk Category - - - - Low  Patient Fall Risk Level - Moderate fall risk - Low fall risk Low fall risk  Fall risk Follow up - - - Falls evaluation completed Falls evaluation completed   Functional Status Survey:    Vitals:   02/05/21 1533  BP: (!) 128/92  Pulse: 79   Temp: (!) 96.1 F (35.6 C)  SpO2: 99%  Weight: 130 lb (59 kg)  Height: 5\' 3"  (1.6 m)   Body mass index is 23.03 kg/m. Wt Readings from Last 3 Encounters:  02/05/21 130 lb (59 kg)  12/18/20 129 lb 3.2 oz (58.6 kg)  11/22/20 128 lb 9.6 oz (58.3 kg)    Physical Exam Vitals and nursing note reviewed.  Constitutional:      General: She is not in acute distress.    Appearance: She is not diaphoretic.  HENT:     Head: Normocephalic and atraumatic.     Nose: Nose normal.     Comments:  Mild erythema    Mouth/Throat:     Mouth: Mucous membranes are moist.     Pharynx: Oropharynx is clear.     Comments: Small amt of clear drainage.  Neck:     Vascular: No JVD.  Cardiovascular:     Rate and Rhythm: Normal rate and regular rhythm.     Heart sounds: No murmur heard. Pulmonary:     Effort: Pulmonary effort is normal. No respiratory distress.     Breath sounds: Normal breath sounds. No wheezing.  Abdominal:     General: Bowel sounds are normal.     Palpations: Abdomen is soft.  Musculoskeletal:     Comments: +1 pitting to BLE  Skin:    General: Skin is warm and dry.  Neurological:     Mental Status: She is alert and oriented to person, place, and time.    Labs reviewed: Recent Labs    08/22/20 0000 08/30/20 1039 12/21/20 0000  NA 143 140 144  K 3.3* 3.8 4.0  CL 101 104 105  CO2 28* 26 29*  GLUCOSE  --  106*  --   BUN 27* 29* 32*  CREATININE 1.0 0.97 1.1  CALCIUM 9.8 9.7 9.6   Recent Labs    08/22/20 0000 12/21/20 0000  AST 27 24  ALT 24 18  ALKPHOS 71 72  ALBUMIN 4.4 4.3   Recent Labs    08/22/20 0000 08/30/20 1039 12/21/20 0000  WBC 7.1 9.3 6.0  NEUTROABS  --  7.4  --   HGB 12.9 12.7 13.3  HCT 38 39.1 40  MCV  --  97.5  --   PLT 226 271 213   Lab Results  Component Value Date   TSH 1.20 08/22/2020   Lab Results  Component Value Date   HGBA1C 5.7 (H) 06/25/2017   Lab Results  Component Value Date   CHOL 138 11/04/2017   HDL 70 11/04/2017    LDLCALC 58 11/04/2017   TRIG 53 11/04/2017   CHOLHDL 3.1 06/25/2017    Significant Diagnostic Results in last 30 days:  No results found.  Assessment/Plan  1. Acute on chronic diastolic heart failure (HCC) Improved with increased lasix dosing.  Continue lasix at previous dosing 60 mg bid Continue to monitor weight daily and keep under 130 Weight today was taken after a meal.   2. Mucopurulent chronic bronchitis (HCC) Has chronic cough and sputum production Continue Symbicort and rescue inhaler.   3. Seasonal and perennial allergic rhinitis Continue Flonase, Azelastine, singulair, claritin.   Total time 29min:  time greater than 50% of total time spent doing pt counseling and coordination of care     Family/ staff Communication: resident   Labs/tests ordered:  NA

## 2021-02-09 DIAGNOSIS — D3132 Benign neoplasm of left choroid: Secondary | ICD-10-CM | POA: Diagnosis not present

## 2021-02-09 DIAGNOSIS — H401132 Primary open-angle glaucoma, bilateral, moderate stage: Secondary | ICD-10-CM | POA: Diagnosis not present

## 2021-02-16 ENCOUNTER — Ambulatory Visit (INDEPENDENT_AMBULATORY_CARE_PROVIDER_SITE_OTHER): Payer: Medicare Other | Admitting: Orthopedic Surgery

## 2021-02-16 ENCOUNTER — Other Ambulatory Visit: Payer: Self-pay

## 2021-02-16 ENCOUNTER — Encounter: Payer: Self-pay | Admitting: Orthopedic Surgery

## 2021-02-16 DIAGNOSIS — Z0000 Encounter for general adult medical examination without abnormal findings: Secondary | ICD-10-CM

## 2021-02-16 NOTE — Progress Notes (Signed)
Subjective:   Jody Blake is a 87 y.o. female who presents for Medicare Annual (Subsequent) preventive examination.  Review of Systems     Cardiac Risk Factors include: advanced age (>79men, >32 women);hypertension     Objective:    There were no vitals filed for this visit. There is no height or weight on file to calculate BMI.  Advanced Directives 02/16/2021 02/05/2021 12/18/2020 08/30/2020 05/17/2020 04/18/2020 02/15/2020  Does Patient Have a Medical Advance Directive? Yes Yes Yes No Yes Yes Yes  Type of Paramedic of [place];Out of facility DNR (pink MOST or yellow form) Out of facility DNR (pink MOST or yellow form);Healthcare Power of [employer] of facility DNR (pink MOST or yellow form);[place] of facility DNR (pink MOST or yellow form) Out of facility DNR (pink MOST or yellow form) [place];Living will;Out of facility DNR (pink MOST or yellow form)  Does patient want to make changes to medical advance directive? No - Patient declined No - Patient declined No - Patient declined - No - Patient declined No - Patient declined No - Patient declined  Copy of [place] in Chart? Yes - validated most recent copy scanned in chart (See row information) Yes - validated most recent copy scanned in chart (See row information) Yes - validated most recent copy scanned in chart (See row information) - - - Yes - validated most recent copy scanned in chart (See row information)  Would patient like information on creating a medical advance directive? - - - No - Patient declined - - -  Pre-existing out of facility DNR order (yellow form or pink MOST form) - Yellow form placed in chart (order not valid for inpatient use) Yellow form placed in chart (order not valid for inpatient use) - Pink MOST/Yellow Form most recent copy in chart - Physician notified to receive inpatient order - -    Current Medications  (verified) Outpatient Encounter Medications as of 02/16/2021  Medication Sig   Azelastine HCl 137 MCG/SPRAY SOLN USE 1 TO 2 SPRAYS INTO BOTH NOSTRILS TWICE DAILY.   Cholecalciferol (VITAMIN D) 125 MCG (5000 UT) CAPS Take 1 capsule by mouth daily.   famotidine (PEPCID) 40 MG tablet Take 1 tablet (40 mg total) by mouth at bedtime.   fluticasone (FLONASE) 50 MCG/ACT nasal spray USE 2 SPRAYS EACH NOSTRIL ONCE A DAY AS NEEDED FOR ALLERGIES OR CONGESTION.   furosemide (LASIX) 40 MG tablet TAKE 1 AND 1/2 TABLETS (60MG ) BY MOUTH ONCE DAILY   gabapentin (NEURONTIN) 100 MG capsule Take 1 capsule (100 mg total) by mouth at bedtime.   latanoprost (XALATAN) 0.005 % ophthalmic solution Place 1 drop into both eyes at bedtime.   levothyroxine (SYNTHROID) 88 MCG tablet TAKE 1 TABLET BY MOUTH  DAILY BEFORE BREAKFAST   loratadine (CLARITIN) 10 MG tablet Take 1 tablet (10 mg total) by mouth daily.   losartan (COZAAR) 100 MG tablet Take 100 mg by mouth 2 (two) times daily.   Lutein 20 MG CAPS Take 1 capsule (20 mg total) by mouth daily.   Magnesium 250 MG TABS Take 1 tablet by mouth daily.   metoprolol succinate (TOPROL-XL) 50 MG 24 hr tablet Take 1.5 tablets (75 mg total) by mouth daily. Take with or immediately following a meal.   montelukast (SINGULAIR) 10 MG tablet Take 1 tablet (10 mg total) by mouth daily.   Multiple Vitamins-Minerals (PRESERVISION AREDS 2) CAPS Take 1 capsule by  mouth in the morning and at bedtime.    predniSONE (DELTASONE) 5 MG tablet Take 1 tablet (5 mg total) by mouth daily with breakfast.   Rivaroxaban (XARELTO) 15 MG TABS tablet TAKE 1 TABLET BY MOUTH  DAILY WITH SUPPER   SYMBICORT 160-4.5 MCG/ACT inhaler INHALE 2 PUFFS BY MOUTH  INTO THE LUNGS TWO TIMES  DAILY   VENTOLIN HFA 108 (90 Base) MCG/ACT inhaler USE 2 PUFFS EVERY 6 HOURS AS NEEDED FOR SHORTNESS OF BREATH AND WHEEZING.   potassium chloride (MICRO-K) 10 MEQ CR capsule Take 2 capsules (20 mEq total) by mouth 2 (two) times daily  for 3 days.   No facility-administered encounter medications on file as of 02/16/2021.    Allergies (verified) Brimonidine tartrate-timolol, Penicillins, and Breo ellipta [fluticasone furoate-vilanterol]   History: Past Medical History:  Diagnosis Date   Anemia    Aortic stenosis    a. Echo 09/06/12 EF 55-60%, no WMA, G2DD, Ao valve sclerosis w/ mod stenosis, LA mildly dilated, PA pressure 88mmHg   Asthmatic bronchitis    Complete heart block (HCC)    s/p permanent pacemaker 06/27/1999 (Battery change 06/2007 and 2016).  s/p AV nodal ablation by Dr [name] 2016.   COPD (chronic obstructive pulmonary disease) (HCC)    Dr. [name]   DDD (degenerative disc disease)    Diastolic dysfunction    a. Echo 09/06/12 EF 55-60%, no WMA, G2DD, Ao valve sclerosis w/ mod stenosis, LA mildly dilated, PA pressure 42mmHg   Diverticulosis    DVT (deep vein thrombosis) in pregnancy 1954   a. LLE   Hypertension    Hypothyroidism    on medication   Macular degeneration    Dr. [name]   Osteoarthrosis, unspecified whether generalized or localized, other specified sites    PAF (paroxysmal atrial fibrillation) ([place])    Stopped flecainide, on amiodarone but still has bouts of A FIB (mostly in mornings).    Pure hypercholesterolemia    Staghorn calculus    Left   Past Surgical History:  Procedure Laterality Date   APPENDECTOMY  ~ 1941   AV NODE ABLATION  05/10/2014   AV NODE ABLATION N/A 05/10/2014   Procedure: AV NODE ABLATION;  Surgeon: Thompson Grayer, MD;  Location[facility] CATH LAB;  Service: Cardiovascular;  Laterality: N/A;   BUNIONECTOMY WITH HAMMERTOE RECONSTRUCTION Bilateral ~ [place]  06/27/99   Medtronic PM implanted by Dr [name]   CARDIOVERSION N/A 12/02/2013   Procedure: CARDIOVERSION;  Surgeon: [name], MD;  Location: [place];  Service: Cardiovascular;  Laterality: N/A;   CATARACT EXTRACTION W/ INTRAOCULAR LENS  IMPLANT, BILATERAL Bilateral    COLONOSCOPY WITH  PROPOFOL N/A 04/22/2013   Procedure: COLONOSCOPY WITH PROPOFOL;  Surgeon: [name], MD;  Location: [facility] ENDOSCOPY;  Service: Endoscopy;  Laterality: N/A;   DILATION AND CURETTAGE OF UTERUS  X 2   "when I was going thru menopause"   ESOPHAGOGASTRODUODENOSCOPY (EGD) WITH PROPOFOL N/A 04/22/2013   Procedure: ESOPHAGOGASTRODUODENOSCOPY (EGD) WITH PROPOFOL;  Surgeon: [name], MD;  Location: [facility] ENDOSCOPY;  Service: Endoscopy;  Laterality: N/A;   HERNIA REPAIR     INCISIONAL HERNIA REPAIR     INSERT / REPLACE / REMOVE PACEMAKER  06/2007   "took out the old; put in new"   INSERT / REPLACE / REMOVE PACEMAKER  05/10/2014   MDT PPM generator change by Dr [name]   JOINT REPLACEMENT     PARTIAL NEPHRECTOMY Left 05/1974   stone disease  PERMANENT PACEMAKER GENERATOR CHANGE N/A 05/10/2014   Procedure: PERMANENT PACEMAKER GENERATOR CHANGE;  Surgeon: Thompson Grayer, MD;  Location: [facility] CATH LAB;  Service: Cardiovascular;  Laterality: N/A;   TONSILLECTOMY AND ADENOIDECTOMY  1930's   TOTAL KNEE ARTHROPLASTY Right 2001   Family History  Problem Relation Age of Onset   Malignant hypertension Father    Hypertension Father    Renal Disease Father    Breast cancer Mother    Heart attack Brother    Stroke Brother    Social History   Socioeconomic History   Marital status: Widowed    Spouse name: Not on file   Number of children: 2   Years of education: Not on file   Highest education level: Not on file  Occupational History   Occupation: RETIRED    Employer: RETIRED    Comment: Family [employer] business  Tobacco Use   Smoking status: Never   Smokeless tobacco: Never  Vaping Use   Vaping Use: Never used  Substance and Sexual Activity   Alcohol use: Yes    Alcohol/week: 7.0 standard drinks    Types: 7 Glasses of wine per week    Comment: occasionally   Drug use: No   Sexual activity: Not Currently    Partners: Male  Other Topics Concern   Not on file  Social History Narrative   Diet? Low  salt, low fat      Do you drink/eat things with caffeine? yes      Marital status?                 married                   What year were you married? 1949      Do you live in a house, apartment, assisted living, condo, trailer, etc.? apartment      Is it one or more stories? 3 stories      How many persons live in your home? Just me      Do you have any pets in your home? (please list) no      Current or past profession: accounting      Do you exercise?          yes                            Type & how often? Walk, class, prescribed daily      Do you have a living will? yes      Do you have a DNR form?     yes                             If not, do you want to discuss one?      Do you have signed POA/HPOA for forms? no         Social Determinants of Health   Financial Resource Strain: Low Risk    Difficulty of Paying Living Expenses: Not hard at all  Food Insecurity: No Food Insecurity   Worried About Charity fundraiser in the Last Year: Never true   [place] in the Last Year: Never true  Transportation Needs: No Transportation Needs   Lack of Transportation (Medical): No   Lack of Transportation (Non-Medical): No  Physical Activity: Sufficiently Active   Days of Exercise per Week: 7 days  Minutes of Exercise per Session: 30 min  Stress: No Stress Concern Present   Feeling of Stress : Not at all  Social Connections: Moderately Isolated   Frequency of Communication with Friends and Family: More than three times a week   Frequency of Social Gatherings with Friends and Family: More than three times a week   Attends Religious Services: Never   Marine scientist or Organizations: Yes   Attends Music therapist: More than 4 times per year   Marital Status: Widowed    Tobacco Counseling Counseling given: Not Answered   Clinical Intake:  Pre-visit preparation completed: Yes  Pain : No/denies pain     BMI - recorded:  22.7 Nutritional Status: BMI of 19-24  Normal Nutritional Risks: None Diabetes: No  How often do you need to have someone help you when you read instructions, pamphlets, or other written materials from your doctor or pharmacy?: 3 - Sometimes What is the last grade level you completed in school?: 3 years college  Diabetic?No  Interpreter Needed?: No      Activities of Daily Living In your present state of health, do you have any difficulty performing the following activities: 02/16/2021  Hearing? Y  Vision? Y  Difficulty concentrating or making decisions? N  Walking or climbing stairs? N  Dressing or bathing? N  Doing errands, shopping? Y  Preparing Food and eating ? N  Using the Toilet? N  In the past six months, have you accidently leaked urine? N  Do you have problems with loss of bowel control? N  Managing your Medications? Y  Managing your Finances? Y  Housekeeping or managing your Housekeeping? Y  Some recent data might be hidden    Patient Care Team: [name], MD as PCP - General (Internal Medicine) Jerline Pain, MD as PCP - Cardiology (Cardiology) Thompson Grayer, MD as Consulting Physician (Cardiology) Deneise Lever, MD as Consulting Physician (Pulmonary Disease) Rutherford Guys, MD as Consulting Physician (Ophthalmology) [name], MD as Consulting Physician (Gastroenterology) Gaynelle Arabian, MD as Consulting Physician (Orthopedic Surgery)  Indicate any recent Medical Services you may have received from other than [facility] providers in the past year (date may be approximate).     Assessment:   This is a routine wellness examination for Jody Blake.  Hearing/Vision screen Hearing Screening - Comments:: Hearing Concerns. Vision Screening - Comments:: Vision Concerns. Patient wears prescription glasses. Patient last eye exam was 02/09/2021  Dietary issues and exercise activities discussed: Current Exercise Habits: Structured exercise class, Type of  exercise: walking, Time (Minutes): 30, Frequency (Times/Week): 7, Weekly Exercise (Minutes/Week): 210, Intensity: Moderate, Exercise limited by: None identified   Goals Addressed             This Visit's Progress    Patient Stated   On track    I would like to stay independent        Depression Screen PHQ 2/9 Scores 02/16/2021 02/16/2021 05/17/2020 02/15/2020 01/25/2020 11/10/2019 09/08/2019  PHQ - 2 Score 0 0 0 0 0 0 0    Fall Risk Fall Risk  02/16/2021 02/16/2021 02/05/2021 12/18/2020 09/01/2020  Falls in the past year? 0 0 0 0 0  Number falls in past yr: 0 0 0 0 0  Injury with Fall? 0 0 0 - -  Risk for fall due to : No Fall Risks No Fall Risks - - -  Follow up Falls evaluation completed Falls evaluation completed Falls evaluation completed Falls evaluation completed -  FALL RISK PREVENTION PERTAINING TO THE HOME:  Any stairs in or around the home? No  If so, are there any without handrails? No  Home free of loose throw rugs in walkways, pet beds, electrical cords, etc? No  Adequate lighting in your home to reduce risk of falls? Yes   ASSISTIVE DEVICES UTILIZED TO PREVENT FALLS:  Life alert? Yes  Use of a cane, walker or w/c? No  Grab bars in the bathroom? Yes  Shower chair or bench in shower? Yes  Elevated toilet seat or a handicapped toilet? Yes   TIMED UP AND GO:  Was the test performed? No .  Length of time to ambulate 10 feet:  sec.   Gait slow and steady without use of assistive device  Cognitive Function: MMSE - Mini Mental State Exam 05/27/2017 03/26/2016  Orientation to time 5 5  Orientation to Place 5 5  Registration 3 3  Attention/ Calculation 5 5  Recall 3 3  Language- name 2 objects 2 2  Language- repeat 1 1  Language- follow 3 step command 3 3  Language- read & follow direction 1 1  Write a sentence 1 1  Copy design 0 1  Total score 29 30     6CIT Screen 02/16/2021 02/15/2020 02/05/2019  What Year? 0 points 0 points 0 points  What month? 0  points 0 points 0 points  What time? 0 points 0 points 0 points  Count back from 20 0 points 0 points 0 points  Months in reverse 0 points 0 points 0 points  Repeat phrase 0 points 0 points 0 points  Total Score 0 0 0    Immunizations Immunization History  Administered Date(s) Administered   Influenza Split 01/07/2011   Influenza Whole 01/16/2009, 01/23/2010   Influenza, High Dose Seasonal PF 01/06/2013, 01/15/2019, 02/04/2020   Influenza,inj,Quad PF,6+ Mos 02/17/2015, 01/30/2018   Influenza,inj,quad, With Preservative 01/06/2017   Influenza-Unspecified 01/06/2014, 02/01/2016, 02/04/2020   Moderna SARS-COV2 Booster Vaccination 08/18/2020   Moderna Sars-Covid-2 Vaccination 04/20/2019, 05/18/2019, 02/22/2020   Pneumococcal Conjugate-13 02/24/2019   Pneumococcal-Unspecified 11/23/2013   Tdap 02/24/2019   Zoster Recombinat (Shingrix) 03/13/2017, 05/20/2017    TDAP status: Up to date  Flu Vaccine status: Up to date  Pneumococcal vaccine status: Up to date  Covid-19 vaccine status: Completed vaccines  Qualifies for Shingles Vaccine? Yes   Zostavax completed No   Shingrix Completed?: Yes  Screening Tests Health Maintenance  Topic Date Due   Pneumonia Vaccine 41+ Years old (2 - PPSV23 if available, else PCV20) 02/24/2020   COVID-19 Vaccine (4 - Booster for Moderna series) 10/13/2020   INFLUENZA VACCINE  11/06/2020   TETANUS/TDAP  02/23/2029   Zoster Vaccines- Shingrix  Completed   HPV VACCINES  Aged Out   DEXA SCAN  Discontinued    Health Maintenance  Health Maintenance Due  Topic Date Due   Pneumonia Vaccine 44+ Years old (2 - PPSV23 if available, else PCV20) 02/24/2020   COVID-19 Vaccine (4 - Booster for Moderna series) 10/13/2020   INFLUENZA VACCINE  11/06/2020    Colorectal cancer screening: No longer required.   Mammogram status: No longer required due to advanced age.  Bone Density- no recent studies- patient refusing due to advanced age  Lung Cancer  Screening: (Low Dose CT Chest recommended if Age 10-80 years, 30 pack-year currently smoking OR have quit w/in 15years.) does not qualify.   Lung Cancer Screening Referral: No  Additional Screening:  Hepatitis C Screening: does not qualify; Completed  Vision Screening: Recommended annual ophthalmology exams for early detection of glaucoma and other disorders of the eye. Is the patient up to date with their annual eye exam?  Yes  Who is the provider or what is the name of the office in which the patient attends annual eye exams? Dr. Brigitte Pulse, Dr. Ellie Lunch If pt is not established with a provider, would they like to be referred to a provider to establish care? No .   Dental Screening: Recommended annual dental exams for proper oral hygiene  Community Resource Referral / Chronic Care Management: CRR required this visit?  No   CCM required this visit?  No      Plan:     I have personally reviewed and noted the following in the patient's chart:   Medical and social history Use of alcohol, tobacco or illicit drugs  Current medications and supplements including opioid prescriptions.  Functional ability and status Nutritional status Physical activity Advanced directives List of other physicians Hospitalizations, surgeries, and ER visits in previous 12 months Vitals Screenings to include cognitive, depression, and falls Referrals and appointments  In addition, I have reviewed and discussed with patient certain preventive protocols, quality metrics, and best practice recommendations. A written personalized care plan for preventive services as well as general preventive health recommendations were provided to patient.     Yvonna Alanis, NP   02/16/2021

## 2021-02-16 NOTE — Progress Notes (Signed)
   This service is provided via telemedicine  No vital signs collected/recorded due to the encounter was a telemedicine visit.   Location of patient (ex: home, work):  Home  Patient consents to a telephone visit:  Yes  Location of the provider (ex: office, home):  [facility].   Name of any referring provider:  Gupta, Anjali L, MD   Names of all persons participating in the telemedicine service and their role in the encounter:  Patient, Jody Blake, RMA, Amy Fargo, NP.    Time spent on call: 8 minutes spent on the phone with Medical Assistant.   

## 2021-02-16 NOTE — Patient Instructions (Signed)
Jody Blake , Thank you for taking time to come for your Medicare Wellness Visit. I appreciate your ongoing commitment to your health goals. Please review the following plan we discussed and let me know if I can assist you in the future.   Screening recommendations/referrals: Colonoscopy N/A Mammogram N/A Bone Density N/A Recommended yearly ophthalmology/optometry visit for glaucoma screening and checkup Recommended yearly dental visit for hygiene and checkup  Vaccinations: Influenza vaccine - up to date Pneumococcal vaccine - up to date Tdap vaccine - up to date Shingles vaccine - up to date COVID vaccine - up to date  Advanced directives: yes  Conditions/risks identified: none  Next appointment: N/A  Preventive Care 40-64 Years, Female Preventive care refers to lifestyle choices and visits with your health care provider that can promote health and wellness. What does preventive care include? A yearly physical exam. This is also called an annual well check. Dental exams once or twice a year. Routine eye exams. Ask your health care provider how often you should have your eyes checked. Personal lifestyle choices, including: Daily care of your teeth and gums. Regular physical activity. Eating a healthy diet. Avoiding tobacco and drug use. Limiting alcohol use. Practicing safe sex. Taking low-dose aspirin daily starting at age 36. Taking vitamin and mineral supplements as recommended by your health care provider. What happens during an annual well check? The services and screenings done by your health care provider during your annual well check will depend on your age, overall health, lifestyle risk factors, and family history of disease. Counseling  Your health care provider may ask you questions about your: Alcohol use. Tobacco use. Drug use. Emotional well-being. Home and relationship well-being. Sexual activity. Eating habits. Work and work Statistician. Method of  birth control. Menstrual cycle. Pregnancy history. Screening  You may have the following tests or measurements: Height, weight, and BMI. Blood pressure. Lipid and cholesterol levels. These may be checked every 5 years, or more frequently if you are over 79 years old. Skin check. Lung cancer screening. You may have this screening every year starting at age 18 if you have a 30-pack-year history of smoking and currently smoke or have quit within the past 15 years. Fecal occult blood test (FOBT) of the stool. You may have this test every year starting at age 45. Flexible sigmoidoscopy or colonoscopy. You may have a sigmoidoscopy every 5 years or a colonoscopy every 10 years starting at age 97. Hepatitis C blood test. Hepatitis B blood test. Sexually transmitted disease (STD) testing. Diabetes screening. This is done by checking your blood sugar (glucose) after you have not eaten for a while (fasting). You may have this done every 1-3 years. Mammogram. This may be done every 1-2 years. Talk to your health care provider about when you should start having regular mammograms. This may depend on whether you have a family history of breast cancer. BRCA-related cancer screening. This may be done if you have a family history of breast, ovarian, tubal, or peritoneal cancers. Pelvic exam and Pap test. This may be done every 3 years starting at age 29. Starting at age 20, this may be done every 5 years if you have a Pap test in combination with an HPV test. Bone density scan. This is done to screen for osteoporosis. You may have this scan if you are at high risk for osteoporosis. Discuss your test results, treatment options, and if necessary, the need for more tests with your health care provider. Vaccines  Your  health care provider may recommend certain vaccines, such as: Influenza vaccine. This is recommended every year. Tetanus, diphtheria, and acellular pertussis (Tdap, Td) vaccine. You may need a Td  booster every 10 years. Zoster vaccine. You may need this after age 22. Pneumococcal 13-valent conjugate (PCV13) vaccine. You may need this if you have certain conditions and were not previously vaccinated. Pneumococcal polysaccharide (PPSV23) vaccine. You may need one or two doses if you smoke cigarettes or if you have certain conditions. Talk to your health care provider about which screenings and vaccines you need and how often you need them. This information is not intended to replace advice given to you by your health care provider. Make sure you discuss any questions you have with your health care provider. Document Released: 04/21/2015 Document Revised: 12/13/2015 Document Reviewed: 01/24/2015 Elsevier Interactive Patient Education  2017 [place] Prevention in the Home Falls can cause injuries. They can happen to people of all ages. There are many things you can do to make your home safe and to help prevent falls. What can I do on the outside of my home? Regularly fix the edges of walkways and driveways and fix any cracks. Remove anything that might make you trip as you walk through a door, such as a raised step or threshold. Trim any bushes or trees on the path to your home. Use bright outdoor lighting. Clear any walking paths of anything that might make someone trip, such as rocks or tools. Regularly check to see if handrails are loose or broken. Make sure that both sides of any steps have handrails. Any raised decks and porches should have guardrails on the edges. Have any leaves, snow, or ice cleared regularly. Use sand or salt on walking paths during winter. Clean up any spills in your garage right away. This includes oil or grease spills. What can I do in the bathroom? Use night lights. Install grab bars by the toilet and in the tub and shower. Do not use towel bars as grab bars. Use non-skid mats or decals in the tub or shower. If you need to sit down in the  shower, use a plastic, non-slip stool. Keep the floor dry. Clean up any water that spills on the floor as soon as it happens. Remove soap buildup in the tub or shower regularly. Attach bath mats securely with double-sided non-slip rug tape. Do not have throw rugs and other things on the floor that can make you trip. What can I do in the bedroom? Use night lights. Make sure that you have a light by your bed that is easy to reach. Do not use any sheets or blankets that are too big for your bed. They should not hang down onto the floor. Have a firm chair that has side arms. You can use this for support while you get dressed. Do not have throw rugs and other things on the floor that can make you trip. What can I do in the kitchen? Clean up any spills right away. Avoid walking on wet floors. Keep items that you use a lot in easy-to-reach places. If you need to reach something above you, use a strong step stool that has a grab bar. Keep electrical cords out of the way. Do not use floor polish or wax that makes floors slippery. If you must use wax, use non-skid floor wax. Do not have throw rugs and other things on the floor that can make you trip. What can  I do with my stairs? Do not leave any items on the stairs. Make sure that there are handrails on both sides of the stairs and use them. Fix handrails that are broken or loose. Make sure that handrails are as long as the stairways. Check any carpeting to make sure that it is firmly attached to the stairs. Fix any carpet that is loose or worn. Avoid having throw rugs at the top or bottom of the stairs. If you do have throw rugs, attach them to the floor with carpet tape. Make sure that you have a light switch at the top of the stairs and the bottom of the stairs. If you do not have them, ask someone to add them for you. What else can I do to help prevent falls? Wear shoes that: Do not have high heels. Have rubber bottoms. Are comfortable and  fit you well. Are closed at the toe. Do not wear sandals. If you use a stepladder: Make sure that it is fully opened. Do not climb a closed stepladder. Make sure that both sides of the stepladder are locked into place. Ask someone to hold it for you, if possible. Clearly mark and make sure that you can see: Any grab bars or handrails. First and last steps. Where the edge of each step is. Use tools that help you move around (mobility aids) if they are needed. These include: Canes. Walkers. Scooters. Crutches. Turn on the lights when you go into a dark area. Replace any light bulbs as soon as they burn out. Set up your furniture so you have a clear path. Avoid moving your furniture around. If any of your floors are uneven, fix them. If there are any pets around you, be aware of where they are. Review your medicines with your doctor. Some medicines can make you feel dizzy. This can increase your chance of falling. Ask your doctor what other things that you can do to help prevent falls. This information is not intended to replace advice given to you by your health care provider. Make sure you discuss any questions you have with your health care provider. Document Released: 01/19/2009 Document Revised: 08/31/2015 Document Reviewed: 04/29/2014 Elsevier Interactive Patient Education  2017 [employer].

## 2021-02-22 DIAGNOSIS — J411 Mucopurulent chronic bronchitis: Secondary | ICD-10-CM | POA: Diagnosis not present

## 2021-02-22 DIAGNOSIS — R2689 Other abnormalities of gait and mobility: Secondary | ICD-10-CM | POA: Diagnosis not present

## 2021-02-22 DIAGNOSIS — E876 Hypokalemia: Secondary | ICD-10-CM | POA: Diagnosis not present

## 2021-02-22 DIAGNOSIS — R2681 Unsteadiness on feet: Secondary | ICD-10-CM | POA: Diagnosis not present

## 2021-02-22 DIAGNOSIS — I951 Orthostatic hypotension: Secondary | ICD-10-CM | POA: Diagnosis not present

## 2021-02-22 DIAGNOSIS — G459 Transient cerebral ischemic attack, unspecified: Secondary | ICD-10-CM | POA: Diagnosis not present

## 2021-02-22 DIAGNOSIS — R278 Other lack of coordination: Secondary | ICD-10-CM | POA: Diagnosis not present

## 2021-02-22 DIAGNOSIS — H353131 Nonexudative age-related macular degeneration, bilateral, early dry stage: Secondary | ICD-10-CM | POA: Diagnosis not present

## 2021-02-22 DIAGNOSIS — I6503 Occlusion and stenosis of bilateral vertebral arteries: Secondary | ICD-10-CM | POA: Diagnosis not present

## 2021-02-23 DIAGNOSIS — G459 Transient cerebral ischemic attack, unspecified: Secondary | ICD-10-CM | POA: Diagnosis not present

## 2021-02-23 DIAGNOSIS — R2681 Unsteadiness on feet: Secondary | ICD-10-CM | POA: Diagnosis not present

## 2021-02-23 DIAGNOSIS — R278 Other lack of coordination: Secondary | ICD-10-CM | POA: Diagnosis not present

## 2021-02-23 DIAGNOSIS — I6503 Occlusion and stenosis of bilateral vertebral arteries: Secondary | ICD-10-CM | POA: Diagnosis not present

## 2021-02-23 DIAGNOSIS — I951 Orthostatic hypotension: Secondary | ICD-10-CM | POA: Diagnosis not present

## 2021-02-23 DIAGNOSIS — R2689 Other abnormalities of gait and mobility: Secondary | ICD-10-CM | POA: Diagnosis not present

## 2021-02-23 DIAGNOSIS — J411 Mucopurulent chronic bronchitis: Secondary | ICD-10-CM | POA: Diagnosis not present

## 2021-02-23 DIAGNOSIS — H353131 Nonexudative age-related macular degeneration, bilateral, early dry stage: Secondary | ICD-10-CM | POA: Diagnosis not present

## 2021-02-26 ENCOUNTER — Encounter: Payer: Medicare Other | Admitting: Internal Medicine

## 2021-02-27 DIAGNOSIS — R2681 Unsteadiness on feet: Secondary | ICD-10-CM | POA: Diagnosis not present

## 2021-02-27 DIAGNOSIS — I951 Orthostatic hypotension: Secondary | ICD-10-CM | POA: Diagnosis not present

## 2021-02-27 DIAGNOSIS — R278 Other lack of coordination: Secondary | ICD-10-CM | POA: Diagnosis not present

## 2021-02-27 DIAGNOSIS — J411 Mucopurulent chronic bronchitis: Secondary | ICD-10-CM | POA: Diagnosis not present

## 2021-02-27 DIAGNOSIS — I6503 Occlusion and stenosis of bilateral vertebral arteries: Secondary | ICD-10-CM | POA: Diagnosis not present

## 2021-02-27 DIAGNOSIS — H353131 Nonexudative age-related macular degeneration, bilateral, early dry stage: Secondary | ICD-10-CM | POA: Diagnosis not present

## 2021-02-27 DIAGNOSIS — G459 Transient cerebral ischemic attack, unspecified: Secondary | ICD-10-CM | POA: Diagnosis not present

## 2021-02-27 DIAGNOSIS — R2689 Other abnormalities of gait and mobility: Secondary | ICD-10-CM | POA: Diagnosis not present

## 2021-03-05 DIAGNOSIS — I951 Orthostatic hypotension: Secondary | ICD-10-CM | POA: Diagnosis not present

## 2021-03-05 DIAGNOSIS — R2681 Unsteadiness on feet: Secondary | ICD-10-CM | POA: Diagnosis not present

## 2021-03-05 DIAGNOSIS — J411 Mucopurulent chronic bronchitis: Secondary | ICD-10-CM | POA: Diagnosis not present

## 2021-03-05 DIAGNOSIS — H353131 Nonexudative age-related macular degeneration, bilateral, early dry stage: Secondary | ICD-10-CM | POA: Diagnosis not present

## 2021-03-05 DIAGNOSIS — I6503 Occlusion and stenosis of bilateral vertebral arteries: Secondary | ICD-10-CM | POA: Diagnosis not present

## 2021-03-05 DIAGNOSIS — R2689 Other abnormalities of gait and mobility: Secondary | ICD-10-CM | POA: Diagnosis not present

## 2021-03-05 DIAGNOSIS — G459 Transient cerebral ischemic attack, unspecified: Secondary | ICD-10-CM | POA: Diagnosis not present

## 2021-03-05 DIAGNOSIS — R278 Other lack of coordination: Secondary | ICD-10-CM | POA: Diagnosis not present

## 2021-03-07 ENCOUNTER — Other Ambulatory Visit: Payer: Self-pay | Admitting: *Deleted

## 2021-03-07 DIAGNOSIS — R278 Other lack of coordination: Secondary | ICD-10-CM | POA: Diagnosis not present

## 2021-03-07 DIAGNOSIS — I951 Orthostatic hypotension: Secondary | ICD-10-CM | POA: Diagnosis not present

## 2021-03-07 DIAGNOSIS — H353131 Nonexudative age-related macular degeneration, bilateral, early dry stage: Secondary | ICD-10-CM | POA: Diagnosis not present

## 2021-03-07 DIAGNOSIS — I6503 Occlusion and stenosis of bilateral vertebral arteries: Secondary | ICD-10-CM | POA: Diagnosis not present

## 2021-03-07 DIAGNOSIS — R2681 Unsteadiness on feet: Secondary | ICD-10-CM | POA: Diagnosis not present

## 2021-03-07 DIAGNOSIS — J411 Mucopurulent chronic bronchitis: Secondary | ICD-10-CM | POA: Diagnosis not present

## 2021-03-07 DIAGNOSIS — G459 Transient cerebral ischemic attack, unspecified: Secondary | ICD-10-CM | POA: Diagnosis not present

## 2021-03-07 DIAGNOSIS — R2689 Other abnormalities of gait and mobility: Secondary | ICD-10-CM | POA: Diagnosis not present

## 2021-03-07 MED ORDER — FUROSEMIDE 40 MG PO TABS: ORAL_TABLET | ORAL | 3 refills | Status: DC

## 2021-03-07 NOTE — Telephone Encounter (Signed)
[facility] Requested refill.  ?

## 2021-03-08 ENCOUNTER — Ambulatory Visit (INDEPENDENT_AMBULATORY_CARE_PROVIDER_SITE_OTHER): Payer: Medicare Other

## 2021-03-08 DIAGNOSIS — I495 Sick sinus syndrome: Secondary | ICD-10-CM

## 2021-03-09 LAB — CUP PACEART REMOTE DEVICE CHECK
Battery Impedance: 573 Ohm
Battery Remaining Longevity: 88 mo
Battery Voltage: 2.79 V
Brady Statistic RV Percent Paced: 99 %
Date Time Interrogation Session: 20221202094227
Implantable Lead Implant Date: 20090318
Implantable Lead Implant Date: 20090318
Implantable Lead Location: 753859
Implantable Lead Location: 753860
Implantable Lead Model: 5076
Implantable Lead Model: 5092
Implantable Pulse Generator Implant Date: 20160202
Lead Channel Impedance Value: 67 Ohm
Lead Channel Impedance Value: 680 Ohm
Lead Channel Pacing Threshold Amplitude: 1 V
Lead Channel Pacing Threshold Pulse Width: 0.4 ms
Lead Channel Setting Pacing Amplitude: 2.5 V
Lead Channel Setting Pacing Pulse Width: 0.4 ms
Lead Channel Setting Sensing Sensitivity: 4 mV

## 2021-03-12 ENCOUNTER — Other Ambulatory Visit: Payer: Self-pay | Admitting: *Deleted

## 2021-03-12 MED ORDER — FUROSEMIDE 40 MG PO TABS: ORAL_TABLET | ORAL | 3 refills | Status: DC

## 2021-03-12 NOTE — Telephone Encounter (Signed)
[facility] Requested refill.  ?

## 2021-03-13 DIAGNOSIS — Z20822 Contact with and (suspected) exposure to covid-19: Secondary | ICD-10-CM | POA: Diagnosis not present

## 2021-03-14 DIAGNOSIS — H353131 Nonexudative age-related macular degeneration, bilateral, early dry stage: Secondary | ICD-10-CM | POA: Diagnosis not present

## 2021-03-14 DIAGNOSIS — I6503 Occlusion and stenosis of bilateral vertebral arteries: Secondary | ICD-10-CM | POA: Diagnosis not present

## 2021-03-14 DIAGNOSIS — J411 Mucopurulent chronic bronchitis: Secondary | ICD-10-CM | POA: Diagnosis not present

## 2021-03-14 DIAGNOSIS — I951 Orthostatic hypotension: Secondary | ICD-10-CM | POA: Diagnosis not present

## 2021-03-14 DIAGNOSIS — G459 Transient cerebral ischemic attack, unspecified: Secondary | ICD-10-CM | POA: Diagnosis not present

## 2021-03-14 DIAGNOSIS — R2681 Unsteadiness on feet: Secondary | ICD-10-CM | POA: Diagnosis not present

## 2021-03-14 DIAGNOSIS — R2689 Other abnormalities of gait and mobility: Secondary | ICD-10-CM | POA: Diagnosis not present

## 2021-03-14 DIAGNOSIS — R278 Other lack of coordination: Secondary | ICD-10-CM | POA: Diagnosis not present

## 2021-03-19 NOTE — Progress Notes (Signed)
Remote pacemaker transmission.   

## 2021-03-21 ENCOUNTER — Non-Acute Institutional Stay: Payer: Medicare Other | Admitting: Internal Medicine

## 2021-03-21 ENCOUNTER — Encounter: Payer: Self-pay | Admitting: Internal Medicine

## 2021-03-21 ENCOUNTER — Other Ambulatory Visit: Payer: Self-pay

## 2021-03-21 VITALS — BP 118/82 | HR 77 | Temp 97.5°F | Ht 63.0 in | Wt 133.0 lb

## 2021-03-21 DIAGNOSIS — H1032 Unspecified acute conjunctivitis, left eye: Secondary | ICD-10-CM

## 2021-03-21 DIAGNOSIS — K219 Gastro-esophageal reflux disease without esophagitis: Secondary | ICD-10-CM | POA: Diagnosis not present

## 2021-03-21 DIAGNOSIS — I10 Essential (primary) hypertension: Secondary | ICD-10-CM | POA: Diagnosis not present

## 2021-03-21 DIAGNOSIS — E039 Hypothyroidism, unspecified: Secondary | ICD-10-CM | POA: Diagnosis not present

## 2021-03-21 DIAGNOSIS — I495 Sick sinus syndrome: Secondary | ICD-10-CM

## 2021-03-21 DIAGNOSIS — J411 Mucopurulent chronic bronchitis: Secondary | ICD-10-CM

## 2021-03-21 DIAGNOSIS — I4821 Permanent atrial fibrillation: Secondary | ICD-10-CM | POA: Diagnosis not present

## 2021-03-21 DIAGNOSIS — N1831 Chronic kidney disease, stage 3a: Secondary | ICD-10-CM | POA: Diagnosis not present

## 2021-03-21 DIAGNOSIS — I5032 Chronic diastolic (congestive) heart failure: Secondary | ICD-10-CM | POA: Diagnosis not present

## 2021-03-21 DIAGNOSIS — Z13820 Encounter for screening for osteoporosis: Secondary | ICD-10-CM

## 2021-03-21 MED ORDER — OFLOXACIN 0.3 % OP SOLN: 1.0000 [drp] | Freq: Four times a day (QID) | OPHTHALMIC | 0 refills | Status: DC

## 2021-03-21 NOTE — Progress Notes (Signed)
Location:  [place] of Service:  Clinic (12)  Provider:   Code Status: DNR Goals of Care:  Advanced Directives 03/21/2021  Does Patient Have a Medical Advance Directive? Yes  Type of Paramedic of [place];Out of facility DNR (pink MOST or yellow form)  Does patient want to make changes to medical advance directive? No - Patient declined  Copy of [place] in Chart? Yes - validated most recent copy scanned in chart (See row information)  Would patient like information on creating a medical advance directive? -  Pre-existing out of facility DNR order (yellow form or pink MOST form) -     Chief Complaint  Patient presents with   Medical Management of Chronic Issues    Patient returns to the clinic for her 3 month follow up. She'd like to discuss her coughing and old concerns. Heather RN manages her medications.    Quality Metric Gaps    PPSV23 and 2nd covid booster ([facility] and [facility] verified)    HPI: Patient is a 87 y.o. female seen today for medical management of chronic diseases.    Patient has a history of A. fib on Xarelto s/p AV nodal ablation, s/p PPM in 2009 Aortic stenosis  COPD with frequent exacerbations.  Not on oxygen.  On chronic prednisone.  Follows closely with Dr. [name].  Is enrolled in palliative care Chronic diastolic CHF on high doses of Lasix History of chronic venous insufficiency Vision loss due to macular degeneration and glaucoma  Came for regular follow up . Continues to stay stable. Has these episodes at night when she gets up and SOB and Cough. Is trying to keep up with her weight but doesn't know how to take her Lasix Prn Feels Wobbly /Dizzy when first stands up. Continues to have issues with SOB on exertion which is chronic Also Very HOH and Poor Eye sight Very hard to take history  Her Left Eye was also red with mild swelling in the Upper lid. She says she has been  itchin.g  Has not had Bone density ever. Daughter not sure if she needs it.     Past Medical History:  Diagnosis Date   Anemia    Aortic stenosis    a. Echo 09/06/12 EF 55-60%, no WMA, G2DD, Ao valve sclerosis w/ mod stenosis, LA mildly dilated, PA pressure 106mmHg   Asthmatic bronchitis    Complete heart block (HCC)    s/p permanent pacemaker 06/27/1999 (Battery change 06/2007 and 2016).  s/p AV nodal ablation by Dr [name] 2016.   COPD (chronic obstructive pulmonary disease) (HCC)    Dr. [name]   DDD (degenerative disc disease)    Diastolic dysfunction    a. Echo 09/06/12 EF 55-60%, no WMA, G2DD, Ao valve sclerosis w/ mod stenosis, LA mildly dilated, PA pressure 27mmHg   Diverticulosis    DVT (deep vein thrombosis) in pregnancy 1954   a. LLE   Hypertension    Hypothyroidism    on medication   Macular degeneration    Dr. [name]   Osteoarthrosis, unspecified whether generalized or localized, other specified sites    PAF (paroxysmal atrial fibrillation) ([place])    Stopped flecainide, on amiodarone but still has bouts of A FIB (mostly in mornings).    Pure hypercholesterolemia    Staghorn calculus    Left    Past Surgical History:  Procedure Laterality Date   APPENDECTOMY  ~ [place]  05/10/2014   AV NODE ABLATION N/A 05/10/2014   Procedure: AV NODE ABLATION;  Surgeon: Thompson Grayer, MD;  Location: [facility] CATH LAB;  Service: Cardiovascular;  Laterality: N/A;   BUNIONECTOMY WITH HAMMERTOE RECONSTRUCTION Bilateral ~ [place]  06/27/99   Medtronic PM implanted by Dr [name]   CARDIOVERSION N/A 12/02/2013   Procedure: CARDIOVERSION;  Surgeon: [name], MD;  Location: [place];  Service: Cardiovascular;  Laterality: N/A;   CATARACT EXTRACTION W/ INTRAOCULAR LENS  IMPLANT, BILATERAL Bilateral    COLONOSCOPY WITH PROPOFOL N/A 04/22/2013   Procedure: COLONOSCOPY WITH PROPOFOL;  Surgeon: [name], MD;  Location: [facility] ENDOSCOPY;  Service: Endoscopy;   Laterality: N/A;   DILATION AND CURETTAGE OF UTERUS  X 2   "when I was going thru menopause"   ESOPHAGOGASTRODUODENOSCOPY (EGD) WITH PROPOFOL N/A 04/22/2013   Procedure: ESOPHAGOGASTRODUODENOSCOPY (EGD) WITH PROPOFOL;  Surgeon: [name], MD;  Location: [facility] ENDOSCOPY;  Service: Endoscopy;  Laterality: N/A;   HERNIA REPAIR     INCISIONAL HERNIA REPAIR     INSERT / REPLACE / REMOVE PACEMAKER  06/2007   "took out the old; put in new"   INSERT / REPLACE / REMOVE PACEMAKER  05/10/2014   MDT PPM generator change by Dr [name]   JOINT REPLACEMENT     PARTIAL NEPHRECTOMY Left 05/1974   stone disease   PERMANENT PACEMAKER GENERATOR CHANGE N/A 05/10/2014   Procedure: PERMANENT PACEMAKER GENERATOR CHANGE;  Surgeon: Thompson Grayer, MD;  Location: [facility] CATH LAB;  Service: Cardiovascular;  Laterality: N/A;   TONSILLECTOMY AND ADENOIDECTOMY  1930's   TOTAL KNEE ARTHROPLASTY Right 2001    Allergies  Allergen Reactions   Brimonidine Tartrate-Timolol Other (See Comments)    REACTION: systemic malaise amigen eye drop   Penicillins Hives   Breo Ellipta [Fluticasone Furoate-Vilanterol] Other (See Comments)    Ran blood pressure up Increased blood pressure    Outpatient Encounter Medications as of 03/21/2021  Medication Sig   Azelastine HCl 137 MCG/SPRAY SOLN USE 1 TO 2 SPRAYS INTO BOTH NOSTRILS TWICE DAILY.   Cholecalciferol (VITAMIN D) 125 MCG (5000 UT) CAPS Take 1 capsule by mouth daily.   famotidine (PEPCID) 40 MG tablet Take 1 tablet (40 mg total) by mouth at bedtime.   fluticasone (FLONASE) 50 MCG/ACT nasal spray USE 2 SPRAYS EACH NOSTRIL ONCE A DAY AS NEEDED FOR ALLERGIES OR CONGESTION.   furosemide (LASIX) 20 MG tablet Take 20 mg by mouth daily as needed. Take around in afternoon when weight is up by 3 lbs in a day   furosemide (LASIX) 40 MG tablet TAKE 1 AND 1/2 TABLETS (60MG ) BY MOUTH ONCE DAILY   gabapentin (NEURONTIN) 100 MG capsule Take 1 capsule (100 mg total) by mouth at bedtime.    latanoprost (XALATAN) 0.005 % ophthalmic solution Place 1 drop into both eyes at bedtime.   levothyroxine (SYNTHROID) 88 MCG tablet TAKE 1 TABLET BY MOUTH  DAILY BEFORE BREAKFAST   loratadine (CLARITIN) 10 MG tablet Take 1 tablet (10 mg total) by mouth daily.   losartan (COZAAR) 100 MG tablet Take 100 mg by mouth 2 (two) times daily.   Lutein 20 MG CAPS Take 1 capsule (20 mg total) by mouth daily.   Magnesium 250 MG TABS Take 1 tablet by mouth daily.   metoprolol succinate (TOPROL-XL) 50 MG 24 hr tablet Take 1.5 tablets (75 mg total) by mouth daily. Take with or immediately following a meal. (Patient taking differently: Take 50 mg by  mouth daily. Take with or immediately following a meal.)   montelukast (SINGULAIR) 10 MG tablet Take 1 tablet (10 mg total) by mouth daily.   Multiple Vitamins-Minerals (PRESERVISION AREDS 2) CAPS Take 1 capsule by mouth in the morning and at bedtime.    potassium chloride (KLOR-CON M) 10 MEQ tablet Take 20 mEq by mouth daily.   predniSONE (DELTASONE) 5 MG tablet Take 1 tablet (5 mg total) by mouth daily with breakfast.   Rivaroxaban (XARELTO) 15 MG TABS tablet TAKE 1 TABLET BY MOUTH  DAILY WITH SUPPER   SYMBICORT 160-4.5 MCG/ACT inhaler INHALE 2 PUFFS BY MOUTH  INTO THE LUNGS TWO TIMES  DAILY   VENTOLIN HFA 108 (90 Base) MCG/ACT inhaler USE 2 PUFFS EVERY 6 HOURS AS NEEDED FOR SHORTNESS OF BREATH AND WHEEZING.   [DISCONTINUED] potassium chloride (MICRO-K) 10 MEQ CR capsule Take 2 capsules (20 mEq total) by mouth 2 (two) times daily for 3 days.   No facility-administered encounter medications on file as of 03/21/2021.    Review of Systems:  Review of Systems  Constitutional:  Negative for activity change and appetite change.  HENT: Negative.    Respiratory:  Positive for cough and shortness of breath.   Cardiovascular:  Negative for leg swelling.  Gastrointestinal:  Negative for constipation.  Genitourinary: Negative.   Musculoskeletal:  Negative for  arthralgias, gait problem and myalgias.  Skin: Negative.   Neurological:  Positive for dizziness. Negative for weakness.  Psychiatric/Behavioral:  Negative for confusion, dysphoric mood and sleep disturbance.    Health Maintenance  Topic Date Due   Pneumonia Vaccine 65+ Years old (2 - PPSV23 if available, else PCV20) 02/24/2020   COVID-19 Vaccine (4 - Booster for Moderna series) 10/13/2020   TETANUS/TDAP  02/23/2029   INFLUENZA VACCINE  Completed   Zoster Vaccines- Shingrix  Completed   HPV VACCINES  Aged Out   DEXA SCAN  Discontinued    Physical Exam: Vitals:   03/21/21 0917  BP: 118/82  Pulse: 77  Temp: (!) 97.5 F (36.4 C)  SpO2: 100%  Weight: 133 lb (60.3 kg)  Height: 5\' 3"  (1.6 m)   Body mass index is 23.56 kg/m. Physical Exam Vitals reviewed.  Constitutional:      Appearance: Normal appearance.  HENT:     Head: Normocephalic.     Nose: Nose normal.     Mouth/Throat:     Mouth: Mucous membranes are moist.     Pharynx: Oropharynx is clear.  Eyes:     Pupils: Pupils are equal, round, and reactive to light.     Comments: Left Eye was slightly red and Upper lid had small styes  Cardiovascular:     Rate and Rhythm: Normal rate and regular rhythm.     Pulses: Normal pulses.     Heart sounds: Murmur heard.  Pulmonary:     Effort: Pulmonary effort is normal.     Breath sounds: Normal breath sounds. No rales.  Abdominal:     General: Abdomen is flat. Bowel sounds are normal.     Palpations: Abdomen is soft.  Musculoskeletal:     Cervical back: Neck supple.     Comments: Mild to mod swelling bilateral  Skin:    General: Skin is warm.  Neurological:     General: No focal deficit present.     Mental Status: She is alert and oriented to person, place, and time.     Comments: Very HOH and poor Vision Does feel Dizzy when stands up  Psychiatric:        Mood and Affect: Mood normal.        Thought Content: Thought content normal.    Labs reviewed: Basic  Metabolic Panel: Recent Labs    08/22/20 0000 08/30/20 1039 12/21/20 0000  NA 143 140 144  K 3.3* 3.8 4.0  CL 101 104 105  CO2 28* 26 29*  GLUCOSE  --  106*  --   BUN 27* 29* 32*  CREATININE 1.0 0.97 1.1  CALCIUM 9.8 9.7 9.6  TSH 1.20  --   --    Liver Function Tests: Recent Labs    08/22/20 0000 12/21/20 0000  AST 27 24  ALT 24 18  ALKPHOS 71 72  ALBUMIN 4.4 4.3   No results for input(s): LIPASE, AMYLASE in the last 8760 hours. No results for input(s): AMMONIA in the last 8760 hours. CBC: Recent Labs    08/22/20 0000 08/30/20 1039 12/21/20 0000  WBC 7.1 9.3 6.0  NEUTROABS  --  7.4  --   HGB 12.9 12.7 13.3  HCT 38 39.1 40  MCV  --  97.5  --   PLT 226 271 213   Lipid Panel: No results for input(s): CHOL, HDL, LDLCALC, TRIG, CHOLHDL, LDLDIRECT in the last 8760 hours. Lab Results  Component Value Date   HGBA1C 5.7 (H) 06/25/2017    Procedures since last visit: CUP [place]  Result Date: 03/09/2021 Scheduled remote reviewed. Normal device function.  Next remote 91 days. LR   Assessment/Plan 1. Chronic diastolic congestive heart failure (HCC) On Lasix 60 mg Does have mild Fluid overload Weight is up also Lungs Clear Discussed about taking 20 mg of lasix extra as needed in afternoon  After d/w facility Nurse we will change it to 20 mg Twice a week D/w Facility nurse also  2. Mucopurulent chronic bronchitis (HCC) Doing well On Chronic Prednisone   3. Permanent atrial fibrillation (HCC) Xarelto and Toprol  4. Essential hypertension With c/o Dizziness will change her Toprol to 50 mg Nurse will check her BP  Also on Cozaar and will take Extra lasix 5. Sick sinus syndrome (HCC) S/P PPM  6. Hypothyroidism, unspecified type TSH normal in 5/22  7. Gastroesophageal reflux disease, unspecified whether esophagitis present On Pepcid  8. Stage 3a chronic kidney disease (HCC) Creat stable  9. Screening for osteoporosis Will order  DEXA Gave info for Prolia As she is on Prednisone it will help Patient is going to talk ot her daughter 59 Acute Conjuctivitis Ocuflox for 2 weeks  Labs/tests ordered:  CMP,CBC before next visit Next appt:  06/25/2021

## 2021-03-21 NOTE — Patient Instructions (Signed)
I have put order for EYE drops  Also Bone density scan in [place] You can get done anytime

## 2021-03-22 MED ORDER — METOPROLOL SUCCINATE ER 50 MG PO TB24: 50.0000 mg | ORAL_TABLET | Freq: Every day | ORAL | 3 refills | Status: DC

## 2021-03-27 ENCOUNTER — Telehealth: Payer: Self-pay | Admitting: Internal Medicine

## 2021-03-27 ENCOUNTER — Other Ambulatory Visit: Payer: Self-pay | Admitting: Internal Medicine

## 2021-03-27 DIAGNOSIS — Z7952 Long term (current) use of systemic steroids: Secondary | ICD-10-CM

## 2021-03-27 NOTE — Telephone Encounter (Signed)
I called the patient to let her know [facility] put her Bone Dens order in today so I would give it a couple of days, and then she can call them to schedule it if they dont call her. She will be going to [place] their phone number is [phone] & their address is [address]

## 2021-04-09 ENCOUNTER — Encounter: Payer: Self-pay | Admitting: Internal Medicine

## 2021-04-09 DIAGNOSIS — R509 Fever, unspecified: Secondary | ICD-10-CM | POA: Diagnosis not present

## 2021-04-10 ENCOUNTER — Encounter: Payer: Self-pay | Admitting: Family

## 2021-04-10 ENCOUNTER — Ambulatory Visit (INDEPENDENT_AMBULATORY_CARE_PROVIDER_SITE_OTHER): Payer: Medicare Other | Admitting: Family

## 2021-04-10 ENCOUNTER — Other Ambulatory Visit: Payer: Self-pay

## 2021-04-10 VITALS — BP 100/78 | HR 63 | Temp 97.6°F | Resp 16 | Ht 63.0 in | Wt 130.4 lb

## 2021-04-10 DIAGNOSIS — R051 Acute cough: Secondary | ICD-10-CM

## 2021-04-10 DIAGNOSIS — R5383 Other fatigue: Secondary | ICD-10-CM | POA: Diagnosis not present

## 2021-04-10 DIAGNOSIS — R509 Fever, unspecified: Secondary | ICD-10-CM | POA: Diagnosis not present

## 2021-04-10 DIAGNOSIS — R059 Cough, unspecified: Secondary | ICD-10-CM | POA: Diagnosis not present

## 2021-04-10 MED ORDER — DOXYCYCLINE HYCLATE 100 MG PO TABS: 100.0000 mg | ORAL_TABLET | Freq: Two times a day (BID) | ORAL | 0 refills | Status: DC

## 2021-04-10 NOTE — Patient Instructions (Signed)
-   Notify provider or go to ED if symptoms worsen or fail to improve.  

## 2021-04-10 NOTE — Progress Notes (Signed)
Provider: Jiraiya Mcewan FNP-C  [name], MD  Patient Care Team: [name], MD as PCP - General (Internal Medicine) Jerline Pain, MD as PCP - Cardiology (Cardiology) Thompson Grayer, MD as Consulting Physician (Cardiology) Deneise Lever, MD as Consulting Physician (Pulmonary Disease) Rutherford Guys, MD as Consulting Physician (Ophthalmology) [name], MD as Consulting Physician (Gastroenterology) Gaynelle Arabian, MD as Consulting Physician (Orthopedic Surgery)  Extended Emergency Contact Information Primary Emergency Contact: [name] of [place] Phone: [phone] Mobile Phone: [phone] Relation: Son Secondary Emergency Contact: Hunt,Cheryl  [place] Mobile Phone: [phone] Relation: Daughter  Code Status:  DNR Goals of care: Advanced Directive information Advanced Directives 04/10/2021  Does Patient Have a Medical Advance Directive? Yes  Type of Paramedic of [place];Out of facility DNR (pink MOST or yellow form)  Does patient want to make changes to medical advance directive? No - Patient declined  Copy of [place] in Chart? Yes - validated most recent copy scanned in chart (See row information)  Would patient like information on creating a medical advance directive? -  Pre-existing out of facility DNR order (yellow form or pink MOST form) -     Chief Complaint  Patient presents with   Acute Visit    Cough, white mucus, fatigue, and low grade fever. Patient has tested negative for covid x 2.     HPI:  Pt is a 88 y.o. female seen today for an acute visit for evaluation of cough,low grade fever x 4 days. Could not stay awake on Saturday.started coughing on Monday for 2 nights.Has been coughing up mucus.  Has had COVID-19  rapid test at the facility which was negative x 2  Has been taking tylenol took this morning at 8 am.  Has not been around sick person  with COVID-19 in the facility.  She has had her COVID-19 vaccine.   Past Medical History:  Diagnosis Date   Anemia    Aortic stenosis    a. Echo 09/06/12 EF 55-60%, no WMA, G2DD, Ao valve sclerosis w/ mod stenosis, LA mildly dilated, PA pressure 60mmHg   Asthmatic bronchitis    Complete heart block (HCC)    s/p permanent pacemaker 06/27/1999 (Battery change 06/2007 and 2016).  s/p AV nodal ablation by Dr [name] 2016.   COPD (chronic obstructive pulmonary disease) (HCC)    Dr. [name]   DDD (degenerative disc disease)    Diastolic dysfunction    a. Echo 09/06/12 EF 55-60%, no WMA, G2DD, Ao valve sclerosis w/ mod stenosis, LA mildly dilated, PA pressure 40mmHg   Diverticulosis    DVT (deep vein thrombosis) in pregnancy 1954   a. LLE   Hypertension    Hypothyroidism    on medication   Macular degeneration    Dr. [name]   Osteoarthrosis, unspecified whether generalized or localized, other specified sites    PAF (paroxysmal atrial fibrillation) ([place])    Stopped flecainide, on amiodarone but still has bouts of A FIB (mostly in mornings).    Pure hypercholesterolemia    Staghorn calculus    Left   Past Surgical History:  Procedure Laterality Date   APPENDECTOMY  ~ 1941   AV NODE ABLATION  05/10/2014   AV NODE ABLATION N/A 05/10/2014   Procedure: AV NODE ABLATION;  Surgeon: Thompson Grayer, MD;  Location: [facility] CATH LAB;  Service: Cardiovascular;  Laterality: N/A;   BUNIONECTOMY WITH HAMMERTOE RECONSTRUCTION Bilateral ~ 1990  CARDIAC PACEMAKER PLACEMENT  06/27/99   Medtronic PM implanted by Dr [name]   CARDIOVERSION N/A 12/02/2013   Procedure: CARDIOVERSION;  Surgeon: [name], MD;  Location: [facility];  Service: Cardiovascular;  Laterality: N/A;   CATARACT EXTRACTION W/ INTRAOCULAR LENS  IMPLANT, BILATERAL Bilateral    COLONOSCOPY WITH PROPOFOL N/A 04/22/2013   Procedure: COLONOSCOPY WITH PROPOFOL;  Surgeon: [name], MD;  Location: [facility] ENDOSCOPY;  Service: Endoscopy;  Laterality: N/A;    DILATION AND CURETTAGE OF UTERUS  X 2   "when I was going thru menopause"   ESOPHAGOGASTRODUODENOSCOPY (EGD) WITH PROPOFOL N/A 04/22/2013   Procedure: ESOPHAGOGASTRODUODENOSCOPY (EGD) WITH PROPOFOL;  Surgeon: [name], MD;  Location: [facility]OS[facility];  Service: Endoscopy;  Laterality: N/A;   HERNIA REPAIR     INCISIONAL HERNIA REPAIR     INSERT / REPLACE / REMOVE PACEMAKER  06/2007   "took out the old; put in new"   INSERT / REPLACE / REMOVE PACEMAKER  05/10/2014   MDT PPM generator change by Dr [name]   JOINT REPLACEMENT     PARTIAL NEPHRECTOMY Left 05/1974   stone disease   PERMANENT PACEMAKER GENERATOR CHANGE N/A 05/10/2014   Procedure: PERMANENT PACEMAKER GENERATOR CHANGE;  Surgeon: Thompson Grayer, MD;  Location: [facility] CATH LAB;  Service: Cardiovascular;  Laterality: N/A;   TONSILLECTOMY AND ADENOIDECTOMY  1930's   TOTAL KNEE ARTHROPLASTY Right 2001    Allergies  Allergen Reactions   Brimonidine Tartrate-Timolol Other (See Comments)    REACTION: systemic malaise amigen eye drop   Penicillins Hives   Breo Ellipta [Fluticasone Furoate-Vilanterol] Other (See Comments)    Ran blood pressure up Increased blood pressure    Outpatient Encounter Medications as of 04/10/2021  Medication Sig   Azelastine HCl 137 MCG/SPRAY SOLN USE 1 TO 2 SPRAYS INTO BOTH NOSTRILS TWICE DAILY.   Cholecalciferol (VITAMIN D) 125 MCG (5000 UT) CAPS Take 1 capsule by mouth daily.   doxycycline (VIBRA-TABS) 100 MG tablet Take 1 tablet (100 mg total) by mouth 2 (two) times daily for 10 days.   famotidine (PEPCID) 40 MG tablet Take 1 tablet (40 mg total) by mouth at bedtime.   fluticasone (FLONASE) 50 MCG/ACT nasal spray USE 2 SPRAYS EACH NOSTRIL ONCE A DAY AS NEEDED FOR ALLERGIES OR CONGESTION.   furosemide (LASIX) 20 MG tablet Take 20 mg by mouth 2 (two) times a week.   furosemide (LASIX) 40 MG tablet TAKE 1 AND 1/2 TABLETS (60MG ) BY MOUTH ONCE DAILY   gabapentin (NEURONTIN) 100 MG capsule Take 1 capsule (100 mg  total) by mouth at bedtime.   latanoprost (XALATAN) 0.005 % ophthalmic solution Place 1 drop into both eyes at bedtime.   levothyroxine (SYNTHROID) 88 MCG tablet TAKE 1 TABLET BY MOUTH  DAILY BEFORE BREAKFAST   loratadine (CLARITIN) 10 MG tablet Take 1 tablet (10 mg total) by mouth daily.   losartan (COZAAR) 100 MG tablet Take 100 mg by mouth 2 (two) times daily.   Lutein 20 MG CAPS Take 1 capsule (20 mg total) by mouth daily.   Magnesium 250 MG TABS Take 1 tablet by mouth daily.   metoprolol succinate (TOPROL XL) 50 MG 24 hr tablet Take 1 tablet (50 mg total) by mouth daily. Take with or immediately following a meal.   montelukast (SINGULAIR) 10 MG tablet Take 1 tablet (10 mg total) by mouth daily.   Multiple Vitamins-Minerals (PRESERVISION AREDS 2) CAPS Take 1 capsule by mouth in the morning and at bedtime.  ofloxacin (OCUFLOX) 0.3 % ophthalmic solution Place 1 drop into both eyes 4 (four) times daily.   potassium chloride (KLOR-CON M) 10 MEQ tablet Take 20 mEq by mouth daily.   predniSONE (DELTASONE) 5 MG tablet Take 1 tablet (5 mg total) by mouth daily with breakfast.   Rivaroxaban (XARELTO) 15 MG TABS tablet TAKE 1 TABLET BY MOUTH  DAILY WITH SUPPER   SYMBICORT 160-4.5 MCG/ACT inhaler INHALE 2 PUFFS BY MOUTH  INTO THE LUNGS TWO TIMES  DAILY   VENTOLIN HFA 108 (90 Base) MCG/ACT inhaler USE 2 PUFFS EVERY 6 HOURS AS NEEDED FOR SHORTNESS OF BREATH AND WHEEZING.   No facility-administered encounter medications on file as of 04/10/2021.    Review of Systems  Constitutional:  Positive for fatigue. Negative for appetite change, chills, fever and unexpected weight change.       Low grade fever   HENT:  Negative for congestion, dental problem, ear discharge, ear pain, facial swelling, hearing loss, nosebleeds, postnasal drip, rhinorrhea, sinus pressure, sinus pain, sneezing, sore throat, tinnitus and trouble swallowing.   Eyes:  Negative for pain, discharge, redness, itching and visual  disturbance.  Respiratory:  Positive for cough. Negative for chest tightness, shortness of breath and wheezing.   Cardiovascular:  Negative for chest pain, palpitations and leg swelling.  Gastrointestinal:  Negative for abdominal distention, abdominal pain, blood in stool, constipation, diarrhea, nausea and vomiting.  Musculoskeletal:  Positive for gait problem. Negative for arthralgias, back pain, joint swelling, myalgias, neck pain and neck stiffness.  Skin:  Negative for color change, pallor and rash.  Neurological:  Negative for dizziness, syncope, speech difficulty, weakness, light-headedness, numbness and headaches.  Psychiatric/Behavioral:  Negative for agitation, behavioral problems, confusion, hallucinations, self-injury, sleep disturbance and suicidal ideas. The patient is not nervous/anxious.    Immunization History  Administered Date(s) Administered   Influenza Split 01/07/2011   Influenza Whole 01/16/2009, 01/23/2010   Influenza, High Dose Seasonal PF 01/06/2013, 01/15/2019, 02/04/2020   Influenza,inj,Quad PF,6+ Mos 02/17/2015, 01/30/2018   Influenza,inj,quad, With Preservative 01/06/2017   Influenza-Unspecified 01/06/2014, 02/01/2016, 02/04/2020, 01/26/2021   Moderna SARS-COV2 Booster Vaccination 08/18/2020   Moderna Sars-Covid-2 Vaccination 04/20/2019, 05/18/2019, 02/22/2020   Pneumococcal Conjugate-13 02/24/2019   Pneumococcal-Unspecified 11/23/2013   Tdap 02/24/2019   Zoster Recombinat (Shingrix) 03/13/2017, 05/20/2017   Pertinent  Health Maintenance Due  Topic Date Due   INFLUENZA VACCINE  Completed   DEXA SCAN  Discontinued   Fall Risk 02/05/2021 02/16/2021 02/16/2021 03/21/2021 04/10/2021  Falls in the past year? 0 0 0 0 0  Was there an injury with Fall? 0 0 0 0 0  Fall Risk Category Calculator 0 0 0 0 0  Fall Risk Category Low Low Low Low Low  Patient Fall Risk Level Low fall risk Low fall risk Low fall risk Low fall risk Low fall risk  Patient at Risk for Falls  Due to - No Fall Risks No Fall Risks No Fall Risks No Fall Risks  Fall risk Follow up Falls evaluation completed Falls evaluation completed Falls evaluation completed Falls evaluation completed Falls evaluation completed   Functional Status Survey:    Vitals:   04/10/21 1318  BP: 100/78  Pulse: 63  Resp: 16  Temp: 97.6 F (36.4 C)  SpO2: 97%  Weight: 130 lb 6.4 oz (59.1 kg)  Height: 5\' 3"  (1.6 m)   Body mass index is 23.1 kg/m. Physical Exam Vitals reviewed.  Constitutional:      General: She is not in acute distress.  Appearance: Normal appearance. She is normal weight. She is not ill-appearing or diaphoretic.  HENT:     Head: Normocephalic.     Right Ear: Tympanic membrane, ear canal and external ear normal. There is no impacted cerumen.     Left Ear: Tympanic membrane, ear canal and external ear normal. There is no impacted cerumen.     Nose: Nose normal. No congestion or rhinorrhea.     Mouth/Throat:     Mouth: Mucous membranes are moist.     Pharynx: Oropharynx is clear. No oropharyngeal exudate or posterior oropharyngeal erythema.  Eyes:     General: No scleral icterus.       Right eye: No discharge.        Left eye: No discharge.     Extraocular Movements: Extraocular movements intact.     Conjunctiva/sclera: Conjunctivae normal.     Pupils: Pupils are equal, round, and reactive to light.  Neck:     Vascular: No carotid bruit.  Cardiovascular:     Rate and Rhythm: Normal rate and regular rhythm.     Pulses: Normal pulses.     Heart sounds: Normal heart sounds. No murmur heard.   No friction rub. No gallop.  Pulmonary:     Effort: Pulmonary effort is normal. No respiratory distress.     Breath sounds: Examination of the left-upper field reveals rales. Rales present. No wheezing or rhonchi.  Chest:     Chest wall: No tenderness.  Abdominal:     General: Bowel sounds are normal. There is no distension.     Palpations: Abdomen is soft. There is no mass.      Tenderness: There is no abdominal tenderness. There is no right CVA tenderness, left CVA tenderness, guarding or rebound.  Musculoskeletal:        General: No swelling or tenderness. Normal range of motion.     Cervical back: Normal range of motion. No rigidity or tenderness.     Right lower leg: No edema.     Left lower leg: No edema.  Lymphadenopathy:     Cervical: No cervical adenopathy.  Skin:    General: Skin is warm and dry.     Coloration: Skin is not pale.     Findings: No bruising, erythema, lesion or rash.  Neurological:     Mental Status: She is alert and oriented to person, place, and time.     Cranial Nerves: No cranial nerve deficit.     Sensory: No sensory deficit.     Motor: No weakness.     Coordination: Coordination normal.     Gait: Gait normal.  Psychiatric:        Mood and Affect: Mood normal.        Speech: Speech normal.        Behavior: Behavior normal.        Thought Content: Thought content normal.        Judgment: Judgment normal.    Labs reviewed: Recent Labs    08/22/20 0000 08/30/20 1039 12/21/20 0000  NA 143 140 144  K 3.3* 3.8 4.0  CL 101 104 105  CO2 28* 26 29*  GLUCOSE  --  106*  --   BUN 27* 29* 32*  CREATININE 1.0 0.97 1.1  CALCIUM 9.8 9.7 9.6   Recent Labs    08/22/20 0000 12/21/20 0000  AST 27 24  ALT 24 18  ALKPHOS 71 72  ALBUMIN 4.4 4.3   Recent Labs  08/22/20 0000 08/30/20 1039 12/21/20 0000  WBC 7.1 9.3 6.0  NEUTROABS  --  7.4  --   HGB 12.9 12.7 13.3  HCT 38 39.1 40  MCV  --  97.5  --   PLT 226 271 213   Lab Results  Component Value Date   TSH 1.20 08/22/2020   Lab Results  Component Value Date   HGBA1C 5.7 (H) 06/25/2017   Lab Results  Component Value Date   CHOL 138 11/04/2017   HDL 70 11/04/2017   LDLCALC 58 11/04/2017   TRIG 53 11/04/2017   CHOLHDL 3.1 06/25/2017    Significant Diagnostic Results in last 30 days:  No results found.  Assessment/Plan 1. Acute cough Afebrile today  Will  rule out other acute abnormalities Left upper lobe rale clears with coughing. Start on Doxycycline as below  - SARS-COV-2 RNA,(COVID-19) QUAL NAAT - doxycycline (VIBRA-TABS) 100 MG tablet; Take 1 tablet (100 mg total) by mouth 2 (two) times daily for 10 days.  Dispense: 20 tablet; Refill: 0 - Notify provider if symptoms worse or fail to improve.   2. Fatigue, unspecified type Suspect due to ongoing symptom. - SARS-COV-2 RNA,(COVID-19) QUAL NAAT  3. Low grade fever Continue OTC Tylenol as needed - SARS-COV-2 RNA,(COVID-19) QUAL NAAT  Family/ staff Communication: Reviewed plan of care with patient verbalized understanding   Labs/tests ordered:   - SARS-COV-2 RNA,(COVID-19) QUAL NAAT  Next Appointment: As needed if symptoms worsen or fail to improve    Sandrea Hughs, NP

## 2021-04-11 ENCOUNTER — Emergency Department (HOSPITAL_BASED_OUTPATIENT_CLINIC_OR_DEPARTMENT_OTHER): Payer: Medicare Other

## 2021-04-11 ENCOUNTER — Inpatient Hospital Stay (HOSPITAL_BASED_OUTPATIENT_CLINIC_OR_DEPARTMENT_OTHER)
Admission: EM | Admit: 2021-04-11 | Discharge: 2021-04-18 | DRG: 193 | Disposition: A | Discharge status: Skilled Nursing Facility | Payer: Medicare Other | Source: Skilled Nursing Facility | Attending: Internal Medicine | Admitting: Internal Medicine

## 2021-04-11 ENCOUNTER — Emergency Department (HOSPITAL_BASED_OUTPATIENT_CLINIC_OR_DEPARTMENT_OTHER): Payer: Medicare Other | Admitting: Radiology

## 2021-04-11 ENCOUNTER — Encounter (HOSPITAL_BASED_OUTPATIENT_CLINIC_OR_DEPARTMENT_OTHER): Payer: Self-pay | Admitting: Emergency Medicine

## 2021-04-11 ENCOUNTER — Other Ambulatory Visit: Payer: Self-pay

## 2021-04-11 DIAGNOSIS — Z7901 Long term (current) use of anticoagulants: Secondary | ICD-10-CM

## 2021-04-11 DIAGNOSIS — I480 Paroxysmal atrial fibrillation: Secondary | ICD-10-CM | POA: Diagnosis present

## 2021-04-11 DIAGNOSIS — Z881 Allergy status to other antibiotic agents status: Secondary | ICD-10-CM

## 2021-04-11 DIAGNOSIS — R0600 Dyspnea, unspecified: Secondary | ICD-10-CM

## 2021-04-11 DIAGNOSIS — J440 Chronic obstructive pulmonary disease with acute lower respiratory infection: Secondary | ICD-10-CM | POA: Diagnosis present

## 2021-04-11 DIAGNOSIS — Z880 Allergy status to penicillin: Secondary | ICD-10-CM | POA: Diagnosis not present

## 2021-04-11 DIAGNOSIS — I10 Essential (primary) hypertension: Secondary | ICD-10-CM | POA: Diagnosis not present

## 2021-04-11 DIAGNOSIS — Z823 Family history of stroke: Secondary | ICD-10-CM

## 2021-04-11 DIAGNOSIS — J189 Pneumonia, unspecified organism: Secondary | ICD-10-CM | POA: Diagnosis not present

## 2021-04-11 DIAGNOSIS — E039 Hypothyroidism, unspecified: Secondary | ICD-10-CM | POA: Diagnosis present

## 2021-04-11 DIAGNOSIS — Z841 Family history of disorders of kidney and ureter: Secondary | ICD-10-CM

## 2021-04-11 DIAGNOSIS — J441 Chronic obstructive pulmonary disease with (acute) exacerbation: Secondary | ICD-10-CM | POA: Diagnosis present

## 2021-04-11 DIAGNOSIS — Z79899 Other long term (current) drug therapy: Secondary | ICD-10-CM

## 2021-04-11 DIAGNOSIS — Z803 Family history of malignant neoplasm of breast: Secondary | ICD-10-CM

## 2021-04-11 DIAGNOSIS — J9811 Atelectasis: Secondary | ICD-10-CM | POA: Diagnosis not present

## 2021-04-11 DIAGNOSIS — I5043 Acute on chronic combined systolic (congestive) and diastolic (congestive) heart failure: Secondary | ICD-10-CM | POA: Diagnosis not present

## 2021-04-11 DIAGNOSIS — Z20822 Contact with and (suspected) exposure to covid-19: Secondary | ICD-10-CM | POA: Diagnosis not present

## 2021-04-11 DIAGNOSIS — E7800 Pure hypercholesterolemia, unspecified: Secondary | ICD-10-CM | POA: Diagnosis present

## 2021-04-11 DIAGNOSIS — I082 Rheumatic disorders of both aortic and tricuspid valves: Secondary | ICD-10-CM | POA: Diagnosis present

## 2021-04-11 DIAGNOSIS — R0602 Shortness of breath: Secondary | ICD-10-CM | POA: Diagnosis not present

## 2021-04-11 DIAGNOSIS — Z7951 Long term (current) use of inhaled steroids: Secondary | ICD-10-CM | POA: Diagnosis not present

## 2021-04-11 DIAGNOSIS — T380X5A Adverse effect of glucocorticoids and synthetic analogues, initial encounter: Secondary | ICD-10-CM | POA: Diagnosis not present

## 2021-04-11 DIAGNOSIS — Z66 Do not resuscitate: Secondary | ICD-10-CM | POA: Diagnosis present

## 2021-04-11 DIAGNOSIS — Z96651 Presence of right artificial knee joint: Secondary | ICD-10-CM | POA: Diagnosis present

## 2021-04-11 DIAGNOSIS — J449 Chronic obstructive pulmonary disease, unspecified: Secondary | ICD-10-CM | POA: Diagnosis present

## 2021-04-11 DIAGNOSIS — E876 Hypokalemia: Secondary | ICD-10-CM | POA: Diagnosis present

## 2021-04-11 DIAGNOSIS — I2781 Cor pulmonale (chronic): Secondary | ICD-10-CM | POA: Diagnosis present

## 2021-04-11 DIAGNOSIS — I509 Heart failure, unspecified: Secondary | ICD-10-CM | POA: Diagnosis not present

## 2021-04-11 DIAGNOSIS — I272 Pulmonary hypertension, unspecified: Secondary | ICD-10-CM | POA: Diagnosis present

## 2021-04-11 DIAGNOSIS — I110 Hypertensive heart disease with heart failure: Secondary | ICD-10-CM | POA: Diagnosis present

## 2021-04-11 DIAGNOSIS — K219 Gastro-esophageal reflux disease without esophagitis: Secondary | ICD-10-CM | POA: Diagnosis present

## 2021-04-11 DIAGNOSIS — Z888 Allergy status to other drugs, medicaments and biological substances status: Secondary | ICD-10-CM

## 2021-04-11 DIAGNOSIS — I5033 Acute on chronic diastolic (congestive) heart failure: Secondary | ICD-10-CM | POA: Diagnosis not present

## 2021-04-11 DIAGNOSIS — J9601 Acute respiratory failure with hypoxia: Secondary | ICD-10-CM | POA: Diagnosis not present

## 2021-04-11 DIAGNOSIS — R062 Wheezing: Secondary | ICD-10-CM | POA: Diagnosis not present

## 2021-04-11 DIAGNOSIS — I442 Atrioventricular block, complete: Secondary | ICD-10-CM | POA: Diagnosis present

## 2021-04-11 DIAGNOSIS — M1990 Unspecified osteoarthritis, unspecified site: Secondary | ICD-10-CM | POA: Diagnosis present

## 2021-04-11 DIAGNOSIS — I5031 Acute diastolic (congestive) heart failure: Secondary | ICD-10-CM | POA: Diagnosis present

## 2021-04-11 DIAGNOSIS — D649 Anemia, unspecified: Secondary | ICD-10-CM | POA: Diagnosis present

## 2021-04-11 DIAGNOSIS — Z8249 Family history of ischemic heart disease and other diseases of the circulatory system: Secondary | ICD-10-CM

## 2021-04-11 DIAGNOSIS — Z79890 Hormone replacement therapy (postmenopausal): Secondary | ICD-10-CM

## 2021-04-11 DIAGNOSIS — Z950 Presence of cardiac pacemaker: Secondary | ICD-10-CM | POA: Diagnosis present

## 2021-04-11 DIAGNOSIS — H9190 Unspecified hearing loss, unspecified ear: Secondary | ICD-10-CM | POA: Diagnosis present

## 2021-04-11 DIAGNOSIS — Z7952 Long term (current) use of systemic steroids: Secondary | ICD-10-CM

## 2021-04-11 DIAGNOSIS — J90 Pleural effusion, not elsewhere classified: Secondary | ICD-10-CM | POA: Diagnosis not present

## 2021-04-11 DIAGNOSIS — I517 Cardiomegaly: Secondary | ICD-10-CM | POA: Diagnosis not present

## 2021-04-11 LAB — URINALYSIS, ROUTINE W REFLEX MICROSCOPIC
Bilirubin Urine: NEGATIVE
Glucose, UA: NEGATIVE mg/dL
Hgb urine dipstick: NEGATIVE
Ketones, ur: NEGATIVE mg/dL
Nitrite: POSITIVE — AB
Protein, ur: NEGATIVE mg/dL
Specific Gravity, Urine: 1.006 (ref 1.005–1.030)
pH: 7 (ref 5.0–8.0)

## 2021-04-11 LAB — COMPREHENSIVE METABOLIC PANEL
ALT: 37 U/L (ref 0–44)
AST: 30 U/L (ref 15–41)
Albumin: 3.6 g/dL (ref 3.5–5.0)
Alkaline Phosphatase: 99 U/L (ref 38–126)
Anion gap: 11 (ref 5–15)
BUN: 19 mg/dL (ref 8–23)
CO2: 26 mmol/L (ref 22–32)
Calcium: 9.3 mg/dL (ref 8.9–10.3)
Chloride: 99 mmol/L (ref 98–111)
Creatinine, Ser: 0.93 mg/dL (ref 0.44–1.00)
GFR, Estimated: 57 mL/min — ABNORMAL LOW (ref >60)
Glucose, Bld: 148 mg/dL — ABNORMAL HIGH (ref 70–99)
Potassium: 3.6 mmol/L (ref 3.5–5.1)
Sodium: 136 mmol/L (ref 135–145)
Total Bilirubin: 1 mg/dL (ref 0.3–1.2)
Total Protein: 6.8 g/dL (ref 6.5–8.1)

## 2021-04-11 LAB — SARS-COV-2 RNA,(COVID-19) QUALITATIVE NAAT: SARS CoV2 RNA: NOT DETECTED

## 2021-04-11 LAB — TROPONIN I (HIGH SENSITIVITY)
Troponin I (High Sensitivity): 18 ng/L — ABNORMAL HIGH (ref <18)
Troponin I (High Sensitivity): 31 ng/L — ABNORMAL HIGH (ref <18)

## 2021-04-11 LAB — CBC WITH DIFFERENTIAL/PLATELET
Abs Immature Granulocytes: 0.06 10*3/uL (ref 0.00–0.07)
Basophils Absolute: 0 10*3/uL (ref 0.0–0.1)
Basophils Relative: 0 %
Eosinophils Absolute: 0 10*3/uL (ref 0.0–0.5)
Eosinophils Relative: 0 %
HCT: 36.7 % (ref 36.0–46.0)
Hemoglobin: 11.7 g/dL — ABNORMAL LOW (ref 12.0–15.0)
Immature Granulocytes: 1 %
Lymphocytes Relative: 4 %
Lymphs Abs: 0.5 10*3/uL — ABNORMAL LOW (ref 0.7–4.0)
MCH: 31.4 pg (ref 26.0–34.0)
MCHC: 31.9 g/dL (ref 30.0–36.0)
MCV: 98.4 fL (ref 80.0–100.0)
Monocytes Absolute: 1.4 10*3/uL — ABNORMAL HIGH (ref 0.1–1.0)
Monocytes Relative: 12 %
Neutro Abs: 9.7 10*3/uL — ABNORMAL HIGH (ref 1.7–7.7)
Neutrophils Relative %: 83 %
Platelets: 271 10*3/uL (ref 150–400)
RBC: 3.73 MIL/uL — ABNORMAL LOW (ref 3.87–5.11)
RDW: 13.3 % (ref 11.5–15.5)
WBC: 11.7 10*3/uL — ABNORMAL HIGH (ref 4.0–10.5)
nRBC: 0 % (ref 0.0–0.2)

## 2021-04-11 LAB — RESP PANEL BY RT-PCR (FLU A&B, COVID) ARPGX2
Influenza A by PCR: NEGATIVE
Influenza B by PCR: NEGATIVE
SARS Coronavirus 2 by RT PCR: NEGATIVE

## 2021-04-11 LAB — APTT: aPTT: 42 seconds — ABNORMAL HIGH (ref 24–36)

## 2021-04-11 LAB — PROTIME-INR
INR: 1.8 — ABNORMAL HIGH (ref 0.8–1.2)
Prothrombin Time: 20.7 seconds — ABNORMAL HIGH (ref 11.4–15.2)

## 2021-04-11 LAB — LACTIC ACID, PLASMA
Lactic Acid, Venous: 1.4 mmol/L (ref 0.5–1.9)
Lactic Acid, Venous: 1.6 mmol/L (ref 0.5–1.9)

## 2021-04-11 LAB — BRAIN NATRIURETIC PEPTIDE: B Natriuretic Peptide: 451.3 pg/mL — ABNORMAL HIGH (ref 0.0–100.0)

## 2021-04-11 LAB — D-DIMER, QUANTITATIVE: D-Dimer, Quant: 2.05 ug/mL-FEU — ABNORMAL HIGH (ref 0.00–0.50)

## 2021-04-11 MED ORDER — SODIUM CHLORIDE 0.9 % IV SOLN
500.0000 mg | Freq: Once | INTRAVENOUS | Status: AC
  Administered 2021-04-11: 500 mg via INTRAVENOUS
  Filled 2021-04-11: qty 5

## 2021-04-11 MED ORDER — ALBUTEROL SULFATE (2.5 MG/3ML) 0.083% IN NEBU
2.5000 mg | INHALATION_SOLUTION | RESPIRATORY_TRACT | Status: DC | PRN
  Administered 2021-04-15: 2.5 mg via RESPIRATORY_TRACT
  Filled 2021-04-11 (×2): qty 3

## 2021-04-11 MED ORDER — SODIUM CHLORIDE 0.9 % IV SOLN
1.0000 g | Freq: Once | INTRAVENOUS | Status: AC
  Administered 2021-04-11: 1 g via INTRAVENOUS
  Filled 2021-04-11: qty 10

## 2021-04-11 MED ORDER — IPRATROPIUM-ALBUTEROL 0.5-2.5 (3) MG/3ML IN SOLN
3.0000 mL | Freq: Once | RESPIRATORY_TRACT | Status: AC
  Administered 2021-04-11: 3 mL via RESPIRATORY_TRACT
  Filled 2021-04-11: qty 3

## 2021-04-11 MED ORDER — FUROSEMIDE 10 MG/ML IJ SOLN
40.0000 mg | Freq: Once | INTRAMUSCULAR | Status: AC
  Administered 2021-04-11: 40 mg via INTRAVENOUS
  Filled 2021-04-11: qty 4

## 2021-04-11 MED ORDER — VANCOMYCIN HCL 1250 MG/250ML IV SOLN
1250.0000 mg | INTRAVENOUS | Status: DC
  Filled 2021-04-11: qty 250

## 2021-04-11 MED ORDER — VANCOMYCIN HCL IN DEXTROSE 1-5 GM/200ML-% IV SOLN
1000.0000 mg | Freq: Once | INTRAVENOUS | Status: AC
  Administered 2021-04-11: 1000 mg via INTRAVENOUS
  Filled 2021-04-11: qty 200

## 2021-04-11 MED ORDER — ALBUTEROL SULFATE HFA 108 (90 BASE) MCG/ACT IN AERS
2.0000 | INHALATION_SPRAY | RESPIRATORY_TRACT | Status: DC | PRN
  Administered 2021-04-11: 2 via RESPIRATORY_TRACT
  Filled 2021-04-11: qty 6.7

## 2021-04-11 MED ORDER — LEVOFLOXACIN IN D5W 750 MG/150ML IV SOLN: 750.0000 mg | Freq: Once | INTRAVENOUS | Status: DC

## 2021-04-11 MED ORDER — IOHEXOL 350 MG/ML SOLN
75.0000 mL | Freq: Once | INTRAVENOUS | Status: AC | PRN
  Administered 2021-04-11: 75 mL via INTRAVENOUS

## 2021-04-11 NOTE — ED Triage Notes (Signed)
Per care giver shortness breath x 2 days , low  O2 sat  this Am , Hx COPD and CHF. Tested for covid and flu , negative results . No relief despite breathing treatment yesterday.   [name]

## 2021-04-11 NOTE — ED Notes (Signed)
Family updated as to patient's status.  Daughter at bedside.  

## 2021-04-11 NOTE — ED Notes (Signed)
Family updated as to patient's status.

## 2021-04-11 NOTE — TOC Progression Note (Signed)
Transition of Care [facility]) - Progression Note    Patient Details  Name: Jody Blake MRN: [id number] Date of Birth: [birth year]  Transition of Care [facility]) CM/SW Contact  Zenon Mayo, RN Phone Number: 04/11/2021, 9:45 PM  Clinical Narrative:     Transition of Care [facility]) Screening Note   Patient Details  Name: Jody Blake Date of Birth: Nov 01, 1925   Transition of Care [facility]) CM/SW Contact:    Zenon Mayo, RN Phone Number: 04/11/2021, 9:45 PM    Transition of Care Department [facility]) has reviewed patient and no TOC needs have been identified at this time. We will continue to monitor patient advancement through interdisciplinary progression rounds. If new patient transition needs arise, please place a TOC consult.          Expected Discharge Plan and Services                                                 Social Determinants of Health (SDOH) Interventions    Readmission Risk Interventions No flowsheet data found.

## 2021-04-11 NOTE — Plan of Care (Signed)
?  Problem: Activity: ?Goal: Capacity to carry out activities will improve ?Outcome: Progressing ?  ?

## 2021-04-11 NOTE — Progress Notes (Signed)
Pharmacy Antibiotic Note  Jody Blake is a 88 y.o. female admitted on 04/11/2021 with pneumonia. Pharmacy has been consulted for vancomycin dosing. Pt is afebrile and WBC is slightly elevated. Lactic acid is <2. Scr is WNL.   Plan: Vancomycin 1g IV x 1 then 1250mg  IV Q48H F/u renal fxn, C&S, clinical status and peak/trough at [facility] F/u continuation of gram negative and atypical coverage    Temp (24hrs), Avg:98.1 F (36.7 C), Min:97.6 F (36.4 C), Max:98.5 F (36.9 C)  Recent Labs  Lab 04/11/21 0950  WBC 11.7*  CREATININE 0.93  LATICACIDVEN 1.4    Estimated Creatinine Clearance: 31.3 mL/min (by C-G formula based on SCr of 0.93 mg/dL).    Allergies  Allergen Reactions   Brimonidine Tartrate-Timolol Other (See Comments)    REACTION: systemic malaise amigen eye drop   Penicillins Hives   Breo Ellipta [Fluticasone Furoate-Vilanterol] Other (See Comments)    Ran blood pressure up Increased blood pressure    Antimicrobials this admission: Vanc 1/4>> Azithro x 1 1/4 CTX x 1 1/4  Dose adjustments this admission: N/A  Microbiology results: Pending  Thank you for allowing pharmacy to be a part of this patients care.  Doug Bucklin, Rande Lawman 04/11/2021 9:51 AM

## 2021-04-11 NOTE — Plan of Care (Signed)
°  Problem: Activity: Goal: Capacity to carry out activities will improve Outcome: Progressing   Problem: Respiratory: Goal: Ability to maintain adequate ventilation will improve Outcome: Progressing Goal: Ability to maintain a clear airway will improve Outcome: Progressing   Problem: Clinical Measurements: Goal: Ability to maintain a body temperature in the normal range will improve Outcome: Progressing

## 2021-04-11 NOTE — ED Provider Notes (Signed)
[place] EMERGENCY DEPT Provider Note   CSN: [id number] Arrival date & time: 04/11/21  0855     History  Chief Complaint  Patient presents with   Shortness of Breath    Jody Blake is a 88 y.o. female.   Shortness of Breath   70F with a history of HTN, HLD, COPD not on home O2, paroxysmal atrial fibrillation, complete heart block status post permanent pacemaker placement in 2001, status post AV nodal ablation in 2016, hypothyroidism, DVT, not on anticoagulation, CHF with diastolic dysfunction who presents to the emergency department with shortness of breath.  The patient states that she has had worsening shortness of breath for the past 2 days.  She was found to have low oxygen saturations this morning.  She a take-home test for COVID and flu and had negative results.  She had a breathing treatment yesterday with no significant improvement.  She presents with persistent dyspnea, orthopnea, worsening bilateral lower extremity edema.  She saw her PCP yesterday and was prescribed doxycycline but has not yet taken the medicine.  She or her PCP notes on my review has had "ow-grade fever for the last 4 days, cough, productive cough with coughing up mucus.  [employer] pacemaker check -communicated with a company rep: Functioning as programmed. No ventricular arrhythmias since sept.  Home Medications Prior to Admission medications   Medication Sig Start Date End Date Taking? Authorizing Provider  Azelastine HCl 137 MCG/SPRAY SOLN USE 1 TO 2 SPRAYS INTO BOTH NOSTRILS TWICE DAILY. 01/04/21  Yes Young, Tarri Fuller D, MD  Cholecalciferol (VITAMIN D) 125 MCG (5000 UT) CAPS Take 1 capsule by mouth daily.   Yes [provider]  famotidine (PEPCID) 40 MG tablet Take 1 tablet (40 mg total) by mouth at bedtime. 12/08/20  Yes Wert, Margreta Journey, NP  fluticasone (FLONASE) 50 MCG/ACT nasal spray USE 2 SPRAYS EACH NOSTRIL ONCE A DAY AS NEEDED FOR ALLERGIES OR CONGESTION. 01/04/21  Yes Young,  Tarri Fuller D, MD  furosemide (LASIX) 20 MG tablet Take 20 mg by mouth 2 (two) times a week.   Yes [provider]  furosemide (LASIX) 40 MG tablet TAKE 1 AND 1/2 TABLETS (60MG ) BY MOUTH ONCE DAILY 03/12/21  Yes [name], MD  gabapentin (NEURONTIN) 100 MG capsule Take 1 capsule (100 mg total) by mouth at bedtime. 12/28/20  Yes Wert, Margreta Journey, NP  latanoprost (XALATAN) 0.005 % ophthalmic solution Place 1 drop into both eyes at bedtime.   Yes [provider]  levothyroxine (SYNTHROID) 88 MCG tablet TAKE 1 TABLET BY MOUTH  DAILY BEFORE BREAKFAST 03/29/19  Yes Reed, Tiffany L, DO  loratadine (CLARITIN) 10 MG tablet Take 1 tablet (10 mg total) by mouth daily. 12/18/20  Yes Royal Hawthorn, NP  losartan (COZAAR) 100 MG tablet Take 100 mg by mouth 2 (two) times daily.   Yes [provider]  Lutein 20 MG CAPS Take 1 capsule (20 mg total) by mouth daily. 11/22/20  Yes Wert, Margreta Journey, NP  Magnesium 250 MG TABS Take 1 tablet by mouth daily.   Yes [provider]  metoprolol succinate (TOPROL XL) 50 MG 24 hr tablet Take 1 tablet (50 mg total) by mouth daily. Take with or immediately following a meal. 03/22/21  Yes [name], MD  montelukast (SINGULAIR) 10 MG tablet Take 1 tablet (10 mg total) by mouth daily. 12/28/20  Yes Royal Hawthorn, NP  Multiple Vitamins-Minerals (PRESERVISION AREDS 2) CAPS Take 1 capsule by mouth in the morning and at bedtime.  Yes [provider]  ofloxacin (OCUFLOX) 0.3 % ophthalmic solution Place 1 drop into both eyes 4 (four) times daily. 03/21/21  Yes [name], MD  potassium chloride (KLOR-CON M) 10 MEQ tablet Take 20 mEq by mouth daily.   Yes [provider]  predniSONE (DELTASONE) 5 MG tablet Take 1 tablet (5 mg total) by mouth daily with breakfast. 05/13/19  Yes Young, Clinton D, MD  Rivaroxaban (XARELTO) 15 MG TABS tablet TAKE 1 TABLET BY MOUTH  DAILY WITH SUPPER 05/10/19  Yes Jerline Pain, MD  SYMBICORT 160-4.5  MCG/ACT inhaler INHALE 2 PUFFS BY MOUTH  INTO THE LUNGS TWO TIMES  DAILY 02/17/19  Yes Young, Clinton D, MD  VENTOLIN HFA 108 (90 Base) MCG/ACT inhaler USE 2 PUFFS EVERY 6 HOURS AS NEEDED FOR SHORTNESS OF BREATH AND WHEEZING. 03/02/19  Yes Young, Clinton D, MD  doxycycline (VIBRA-TABS) 100 MG tablet Take 1 tablet (100 mg total) by mouth 2 (two) times daily for 10 days. 04/10/21 04/20/21  Ngetich, Dinah C, NP      Allergies    Brimonidine tartrate-timolol, Penicillins, Breo ellipta [fluticasone furoate-vilanterol], and Vancomycin    Review of Systems   Review of Systems  Respiratory:  Positive for shortness of breath.    Physical Exam Updated Vital Signs BP (!) 177/75    Pulse 62    Temp 99 F (37.2 C)    Resp 20    LMP 04/08/1974    SpO2 99%  Physical Exam Vitals and nursing note reviewed.  Constitutional:      General: She is not in acute distress. HENT:     Head: Normocephalic and atraumatic.  Eyes:     Conjunctiva/sclera: Conjunctivae normal.     Pupils: Pupils are equal, round, and reactive to light.  Neck:     Vascular: JVD present.  Cardiovascular:     Rate and Rhythm: Normal rate and regular rhythm.  Pulmonary:     Effort: Pulmonary effort is normal. Tachypnea present. No respiratory distress.     Breath sounds: Examination of the right-lower field reveals rales. Examination of the left-lower field reveals rales. Rales present.  Abdominal:     General: There is no distension.     Tenderness: There is no guarding.  Musculoskeletal:        General: No deformity or signs of injury.     Cervical back: Neck supple.     Right lower leg: Edema present.     Left lower leg: Edema present.     Comments: 2+ bilateral lower extremity edema  Skin:    Findings: No lesion or rash.  Neurological:     General: No focal deficit present.     Mental Status: She is alert. Mental status is at baseline.    ED Results / Procedures / Treatments   Labs (all labs ordered are listed, but  only abnormal results are displayed) Labs Reviewed  COMPREHENSIVE METABOLIC PANEL - Abnormal; Notable for the following components:      Result Value   Glucose, Bld 148 (*)    GFR, Estimated 57 (*)    All other components within normal limits  BRAIN NATRIURETIC PEPTIDE - Abnormal; Notable for the following components:   B Natriuretic Peptide 451.3 (*)    All other components within normal limits  D-DIMER, QUANTITATIVE - Abnormal; Notable for the following components:   D-Dimer, Quant 2.05 (*)    All other components within normal limits  CBC WITH DIFFERENTIAL/PLATELET - Abnormal; Notable  for the following components:   WBC 11.7 (*)    RBC 3.73 (*)    Hemoglobin 11.7 (*)    Neutro Abs 9.7 (*)    Lymphs Abs 0.5 (*)    Monocytes Absolute 1.4 (*)    All other components within normal limits  PROTIME-INR - Abnormal; Notable for the following components:   Prothrombin Time 20.7 (*)    INR 1.8 (*)    All other components within normal limits  APTT - Abnormal; Notable for the following components:   aPTT 42 (*)    All other components within normal limits  URINALYSIS, ROUTINE W REFLEX MICROSCOPIC - Abnormal; Notable for the following components:   Nitrite POSITIVE (*)    Leukocytes,Ua TRACE (*)    Bacteria, UA RARE (*)    All other components within normal limits  TROPONIN I (HIGH SENSITIVITY) - Abnormal; Notable for the following components:   Troponin I (High Sensitivity) 18 (*)    All other components within normal limits  TROPONIN I (HIGH SENSITIVITY) - Abnormal; Notable for the following components:   Troponin I (High Sensitivity) 31 (*)    All other components within normal limits  RESP PANEL BY RT-PCR (FLU A&B, COVID) [id number]  CULTURE, BLOOD (ROUTINE X 2)  URINE CULTURE  CULTURE, BLOOD (ROUTINE X 2) W REFLEX TO ID PANEL  LACTIC ACID, PLASMA  LACTIC ACID, PLASMA    EKG EKG Interpretation  Date/Time:  Wednesday April 11 2021 09:27:08 EST Ventricular Rate:  62 PR  Interval:    QRS Duration: 172 QT Interval:  466 QTC Calculation: 474 R Axis:   -83 Text Interpretation: AV dissociation IVCD, consider atypical RBBB LVH with IVCD and secondary repol abnrm Inferior infarct, acute Left bundle branch block VENTRICULAR PACED RHYTHM Reconfirmed by Regan Lemming ([id number]) on 04/11/2021 9:53:26 AM  Radiology CT Angio Chest PE W and/or Wo Contrast  Result Date: 04/11/2021 CLINICAL DATA:  Shortness of breath. EXAM: CT ANGIOGRAPHY CHEST WITH CONTRAST TECHNIQUE: Multidetector CT imaging of the chest was performed using the standard protocol during bolus administration of intravenous contrast. Multiplanar CT image reconstructions and MIPs were obtained to evaluate the vascular anatomy. CONTRAST:  14mL OMNIPAQUE IOHEXOL 350 MG/ML SOLN COMPARISON:  October 20, 2006. FINDINGS: Cardiovascular: Satisfactory opacification of the pulmonary arteries to the segmental level. No evidence of pulmonary embolism. Mild cardiomegaly is noted. Coronary calcifications are noted. No pericardial effusion. Atherosclerosis of thoracic aorta is noted without aneurysm formation. Mediastinum/Nodes: No enlarged mediastinal, hilar, or axillary lymph nodes. Thyroid gland, trachea, and esophagus demonstrate no significant findings. Lungs/Pleura: No pneumothorax is noted. Small bilateral pleural effusions are noted with adjacent sub some atelectasis, left greater than right. Multiple patchy airspace opacities are noted consistent with multifocal pneumonia. Upper Abdomen: No acute abnormality. Musculoskeletal: No chest wall abnormality. No acute or significant osseous findings. Review of the MIP images confirms the above findings. IMPRESSION: No definite evidence of pulmonary embolus. Multiple patchy airspace opacities are noted bilaterally consistent with multifocal pneumonia. Small bilateral pleural effusions are noted with adjacent subsegmental atelectasis, left greater than right. Coronary calcifications are noted  suggesting coronary disease. Aortic Atherosclerosis (ICD10-I70.0). Electronically Signed   By: Marijo Conception M.D.   On: 04/11/2021 12:54   DG Chest Port 1 View  Result Date: 04/11/2021 CLINICAL DATA:  Shortness of breath EXAM: PORTABLE CHEST 1 VIEW COMPARISON:  May 2022 FINDINGS: Ill-defined patchy opacities bilaterally. Probable Kerley B lines. Left lung base pleuroparenchymal opacity. Stable cardiomediastinal contours with mild cardiomegaly. No pneumothorax.  IMPRESSION: Ill-defined patchy opacities bilaterally and left basilar opacification. Favor edema given probable Kerley B-lines. Possible small left pleural effusion. Atypical/viral pneumonia is also a consideration. Electronically Signed   By: Macy Mis M.D.   On: 04/11/2021 09:31    Procedures Procedures    Medications Ordered in ED Medications  albuterol (VENTOLIN HFA) 108 (90 Base) MCG/ACT inhaler 2 puff (2 puffs Inhalation Given 04/11/21 0929)  vancomycin (VANCOREADY) IVPB 1250 mg/250 mL (has no administration in time range)  furosemide (LASIX) injection 40 mg (40 mg Intravenous Given 04/11/21 0956)  cefTRIAXone (ROCEPHIN) 1 g in sodium chloride 0.9 % 100 mL IVPB (0 g Intravenous Stopped 04/11/21 1336)  azithromycin (ZITHROMAX) 500 mg in sodium chloride 0.9 % 250 mL IVPB (0 mg Intravenous Stopped 04/11/21 1338)  vancomycin (VANCOCIN) IVPB 1000 mg/200 mL premix (0 mg Intravenous Stopped 04/11/21 1443)  iohexol (OMNIPAQUE) 350 MG/ML injection 75 mL (75 mLs Intravenous Contrast Given 04/11/21 1220)  furosemide (LASIX) injection 40 mg (40 mg Intravenous Given 04/11/21 1608)  ipratropium-albuterol (DUONEB) 0.5-2.5 (3) MG/3ML nebulizer solution 3 mL (3 mLs Nebulization Given 04/11/21 2015)    ED Course/ Medical Decision Making/ A&P Clinical Course as of 04/11/21 2022  Wed Apr 11, 2021  1402 SpO2: 95 % [JL]    Clinical Course User Index [JL] Regan Lemming, MD                           Medical Decision Making  [phone] with a history of HTN,  HLD, COPD not on home O2, paroxysmal atrial fibrillation, complete heart block status post permanent pacemaker placement in 2001, status post AV nodal ablation in 2016, hypothyroidism, DVT, not on anticoagulation, CHF with diastolic dysfunction who presents to the emergency department with shortness of breath.  The patient states that she has had worsening shortness of breath for the past 2 days.  She was found to have low oxygen saturations this morning.  She a take-home test for COVID and flu and had negative results.  She had a breathing treatment yesterday with no significant improvement.  She presents with persistent dyspnea, orthopnea, worsening bilateral lower extremity edema.  She saw her PCP yesterday and was prescribed doxycycline but has not yet taken the medicine.  She or her PCP notes on my review has had "ow-grade fever for the last 4 days, cough, productive cough with coughing up mucus.   On arrival, the patient was afebrile, not tachycardic, tachypneic to the mid 20s to high 30s, hypertensive BP 178/71, saturating 95% on room air.  She was placed on 2 L O2 via nasal cannula due to persistent tachypnea.  Cardiac telemetry revealed what appeared to be a paced rhythm, ventricular rate around 70.  EKG performed revealed a paced rhythm, no evidence for STEMI.  [employer] pacemaker check -communicated with a company rep: Functioning as programmed. No ventricular arrhythmias since sept.  Given the patient's JVD, bilateral lower extremity edema, rales on physical exam, concern for CHF exacerbation/volume overload.  Additional concern for possible pneumonia.  Patient has recently been tested for COVID-19 and influenza but will retest today as it remains on the differential versus other viral URI.  Low concern for COPD exacerbation with no wheezing heard on exam.  No chest pain but ACS remains on the differential, will collect cardiac troponins.  Chest x-ray was performed and reviewed by myself in  addition to radiology and revealed bilateral multifocal infiltrates most consistent with pulmonary edema.   The  patient was administered 40 mg of IV Lasix.  Further work-up revealed a moderately elevated BNP to 451, CMP without acute abnormalities, urinalysis without clear evidence of UTI, positive nitrites, trace leukocytes, 0-5 WBCs and rare bacteria, a CBC revealed a mild leukocytosis to 11.7, anemia to 11.7, a D-dimer was elevated to 2.05 and initial cardiac troponins were elevated but flat from 18-31.  Blood cultures and urine cultures were collected and pending.  Given the concern for possible superimposed pneumonia on the patient's pulmonary edema, the patient was covered with vancomycin, Rocephin and azithromycin.  She did have a reaction to the vancomycin and it was subsequently stopped.  She was administered an additional 40 mg of IV Lasix due to persistent tachypnea and concern for volume overload with persistent JVD.  She remained hemodynamically stable with no significant episodes of hypotension, no tachycardia.  She remained on 2 to 3 L/min nasal cannula, primarily for tachypnea.  A D-dimer was collected and resulted elevated to 2.05. A CTA PE study was performed which revealed no definite evidence of pulmonary embolus, multiple patchy airspace opacities are noted bilaterally consistent with a likely multifocal pneumonia with small bilateral pleural effusions noted.  Coronary calcifications were also noted.  Findings are most significant for pulmonary edema with potential superimposed infection for which we are treating the patient.  Hospitalist medicine consulted for admission for further care management.  I discussed the care with the patient bedside and obtained additional history and discuss further MDM with patient family members to include her son in law and daughter.     Final Clinical Impression(s) / ED Diagnoses Final diagnoses:  Acute respiratory failure with hypoxia ([place])  Acute on  chronic congestive heart failure, unspecified heart failure type [facility])    Rx / DC Orders ED Discharge Orders     None         Regan Lemming, MD 04/11/21 2022

## 2021-04-11 NOTE — ED Notes (Signed)
Patient transported to CT 

## 2021-04-12 ENCOUNTER — Observation Stay (HOSPITAL_COMMUNITY): Payer: Medicare Other

## 2021-04-12 ENCOUNTER — Encounter (HOSPITAL_COMMUNITY): Payer: Self-pay | Admitting: Internal Medicine

## 2021-04-12 DIAGNOSIS — J441 Chronic obstructive pulmonary disease with (acute) exacerbation: Secondary | ICD-10-CM | POA: Diagnosis present

## 2021-04-12 DIAGNOSIS — R0602 Shortness of breath: Secondary | ICD-10-CM | POA: Diagnosis present

## 2021-04-12 DIAGNOSIS — E039 Hypothyroidism, unspecified: Secondary | ICD-10-CM | POA: Diagnosis present

## 2021-04-12 DIAGNOSIS — J449 Chronic obstructive pulmonary disease, unspecified: Secondary | ICD-10-CM | POA: Diagnosis not present

## 2021-04-12 DIAGNOSIS — Z79890 Hormone replacement therapy (postmenopausal): Secondary | ICD-10-CM | POA: Diagnosis not present

## 2021-04-12 DIAGNOSIS — Z880 Allergy status to penicillin: Secondary | ICD-10-CM | POA: Diagnosis not present

## 2021-04-12 DIAGNOSIS — Z950 Presence of cardiac pacemaker: Secondary | ICD-10-CM | POA: Diagnosis not present

## 2021-04-12 DIAGNOSIS — E7800 Pure hypercholesterolemia, unspecified: Secondary | ICD-10-CM | POA: Diagnosis present

## 2021-04-12 DIAGNOSIS — J9601 Acute respiratory failure with hypoxia: Secondary | ICD-10-CM

## 2021-04-12 DIAGNOSIS — I5033 Acute on chronic diastolic (congestive) heart failure: Secondary | ICD-10-CM | POA: Diagnosis not present

## 2021-04-12 DIAGNOSIS — D649 Anemia, unspecified: Secondary | ICD-10-CM | POA: Diagnosis present

## 2021-04-12 DIAGNOSIS — I110 Hypertensive heart disease with heart failure: Secondary | ICD-10-CM | POA: Diagnosis present

## 2021-04-12 DIAGNOSIS — Z881 Allergy status to other antibiotic agents status: Secondary | ICD-10-CM | POA: Diagnosis not present

## 2021-04-12 DIAGNOSIS — I5031 Acute diastolic (congestive) heart failure: Secondary | ICD-10-CM | POA: Diagnosis not present

## 2021-04-12 DIAGNOSIS — I480 Paroxysmal atrial fibrillation: Secondary | ICD-10-CM | POA: Diagnosis present

## 2021-04-12 DIAGNOSIS — R062 Wheezing: Secondary | ICD-10-CM | POA: Diagnosis not present

## 2021-04-12 DIAGNOSIS — Z888 Allergy status to other drugs, medicaments and biological substances status: Secondary | ICD-10-CM | POA: Diagnosis not present

## 2021-04-12 DIAGNOSIS — I5043 Acute on chronic combined systolic (congestive) and diastolic (congestive) heart failure: Secondary | ICD-10-CM | POA: Diagnosis present

## 2021-04-12 DIAGNOSIS — Z7951 Long term (current) use of inhaled steroids: Secondary | ICD-10-CM | POA: Diagnosis not present

## 2021-04-12 DIAGNOSIS — I442 Atrioventricular block, complete: Secondary | ICD-10-CM | POA: Diagnosis present

## 2021-04-12 DIAGNOSIS — J189 Pneumonia, unspecified organism: Principal | ICD-10-CM

## 2021-04-12 DIAGNOSIS — J90 Pleural effusion, not elsewhere classified: Secondary | ICD-10-CM | POA: Diagnosis not present

## 2021-04-12 DIAGNOSIS — I10 Essential (primary) hypertension: Secondary | ICD-10-CM | POA: Diagnosis not present

## 2021-04-12 DIAGNOSIS — I272 Pulmonary hypertension, unspecified: Secondary | ICD-10-CM | POA: Diagnosis present

## 2021-04-12 DIAGNOSIS — J440 Chronic obstructive pulmonary disease with acute lower respiratory infection: Secondary | ICD-10-CM | POA: Diagnosis present

## 2021-04-12 DIAGNOSIS — Z7901 Long term (current) use of anticoagulants: Secondary | ICD-10-CM | POA: Diagnosis not present

## 2021-04-12 DIAGNOSIS — I082 Rheumatic disorders of both aortic and tricuspid valves: Secondary | ICD-10-CM | POA: Diagnosis present

## 2021-04-12 DIAGNOSIS — Z79899 Other long term (current) drug therapy: Secondary | ICD-10-CM | POA: Diagnosis not present

## 2021-04-12 DIAGNOSIS — Z66 Do not resuscitate: Secondary | ICD-10-CM | POA: Diagnosis present

## 2021-04-12 DIAGNOSIS — Z7952 Long term (current) use of systemic steroids: Secondary | ICD-10-CM | POA: Diagnosis not present

## 2021-04-12 DIAGNOSIS — Z20822 Contact with and (suspected) exposure to covid-19: Secondary | ICD-10-CM | POA: Diagnosis present

## 2021-04-12 LAB — TROPONIN I (HIGH SENSITIVITY)
Troponin I (High Sensitivity): 55 ng/L — ABNORMAL HIGH (ref <18)
Troponin I (High Sensitivity): 54 ng/L — ABNORMAL HIGH (ref <18)

## 2021-04-12 LAB — ECHOCARDIOGRAM COMPLETE
AR max vel: 2.04 cm2
AV Area VTI: 1.77 cm2
AV Area mean vel: 1.99 cm2
AV Mean grad: 27.2 mmHg
AV Peak grad: 52.1 mmHg
Ao pk vel: 3.61 m/s
Area-P 1/2: 3.4 cm2
Calc EF: 66.8 %
Height: 63 in
MV VTI: 3.81 cm2
S' Lateral: 1.8 cm
Single Plane A2C EF: 55.8 %
Single Plane A4C EF: 76.7 %
Weight: 2067.03 oz

## 2021-04-12 LAB — BASIC METABOLIC PANEL
Anion gap: 12 (ref 5–15)
BUN: 16 mg/dL (ref 8–23)
CO2: 26 mmol/L (ref 22–32)
Calcium: 8.9 mg/dL (ref 8.9–10.3)
Chloride: 99 mmol/L (ref 98–111)
Creatinine, Ser: 1.03 mg/dL — ABNORMAL HIGH (ref 0.44–1.00)
GFR, Estimated: 51 mL/min — ABNORMAL LOW (ref >60)
Glucose, Bld: 107 mg/dL — ABNORMAL HIGH (ref 70–99)
Potassium: 3 mmol/L — ABNORMAL LOW (ref 3.5–5.1)
Sodium: 137 mmol/L (ref 135–145)

## 2021-04-12 LAB — CBC WITH DIFFERENTIAL/PLATELET
Abs Immature Granulocytes: 0.12 10*3/uL — ABNORMAL HIGH (ref 0.00–0.07)
Basophils Absolute: 0.1 10*3/uL (ref 0.0–0.1)
Basophils Relative: 0 %
Eosinophils Absolute: 0 10*3/uL (ref 0.0–0.5)
Eosinophils Relative: 0 %
HCT: 36 % (ref 36.0–46.0)
Hemoglobin: 11.5 g/dL — ABNORMAL LOW (ref 12.0–15.0)
Immature Granulocytes: 1 %
Lymphocytes Relative: 7 %
Lymphs Abs: 0.9 10*3/uL (ref 0.7–4.0)
MCH: 31.4 pg (ref 26.0–34.0)
MCHC: 31.9 g/dL (ref 30.0–36.0)
MCV: 98.4 fL (ref 80.0–100.0)
Monocytes Absolute: 1.6 10*3/uL — ABNORMAL HIGH (ref 0.1–1.0)
Monocytes Relative: 12 %
Neutro Abs: 10.7 10*3/uL — ABNORMAL HIGH (ref 1.7–7.7)
Neutrophils Relative %: 80 %
Platelets: 308 10*3/uL (ref 150–400)
RBC: 3.66 MIL/uL — ABNORMAL LOW (ref 3.87–5.11)
RDW: 13.2 % (ref 11.5–15.5)
WBC: 13.4 10*3/uL — ABNORMAL HIGH (ref 4.0–10.5)
nRBC: 0 % (ref 0.0–0.2)

## 2021-04-12 LAB — MRSA NEXT GEN BY PCR, NASAL: MRSA by PCR Next Gen: NOT DETECTED

## 2021-04-12 LAB — HIV ANTIBODY (ROUTINE TESTING W REFLEX): HIV Screen 4th Generation wRfx: NONREACTIVE

## 2021-04-12 LAB — TSH: TSH: 0.317 u[IU]/mL — ABNORMAL LOW (ref 0.350–4.500)

## 2021-04-12 LAB — STREP PNEUMONIAE URINARY ANTIGEN: Strep Pneumo Urinary Antigen: NEGATIVE

## 2021-04-12 LAB — MAGNESIUM: Magnesium: 2.1 mg/dL (ref 1.7–2.4)

## 2021-04-12 MED ORDER — FUROSEMIDE 40 MG PO TABS: 40.0000 mg | ORAL_TABLET | Freq: Every day | ORAL | Status: DC

## 2021-04-12 MED ORDER — POTASSIUM CHLORIDE 20 MEQ PO PACK
40.0000 meq | PACK | Freq: Once | ORAL | Status: AC
  Administered 2021-04-12: 40 meq via ORAL
  Filled 2021-04-12: qty 2

## 2021-04-12 MED ORDER — SODIUM CHLORIDE 0.9 % IV SOLN
2.0000 g | INTRAVENOUS | Status: AC
  Administered 2021-04-12 – 2021-04-16 (×5): 2 g via INTRAVENOUS
  Filled 2021-04-12 (×5): qty 20

## 2021-04-12 MED ORDER — METOPROLOL SUCCINATE ER 50 MG PO TB24
50.0000 mg | ORAL_TABLET | Freq: Every day | ORAL | Status: DC
  Administered 2021-04-12 – 2021-04-18 (×7): 50 mg via ORAL
  Filled 2021-04-12 (×7): qty 1

## 2021-04-12 MED ORDER — LEVOTHYROXINE SODIUM 88 MCG PO TABS
88.0000 ug | ORAL_TABLET | Freq: Every day | ORAL | Status: DC
  Administered 2021-04-12: 88 ug via ORAL
  Filled 2021-04-12: qty 1

## 2021-04-12 MED ORDER — GUAIFENESIN 100 MG/5ML PO LIQD
5.0000 mL | ORAL | Status: DC | PRN
  Administered 2021-04-12 – 2021-04-18 (×6): 5 mL via ORAL
  Filled 2021-04-12 (×6): qty 5

## 2021-04-12 MED ORDER — FAMOTIDINE 20 MG PO TABS
20.0000 mg | ORAL_TABLET | Freq: Every day | ORAL | Status: DC
  Administered 2021-04-12 – 2021-04-17 (×6): 20 mg via ORAL
  Filled 2021-04-12 (×6): qty 1

## 2021-04-12 MED ORDER — SODIUM CHLORIDE 0.9 % IV SOLN
500.0000 mg | INTRAVENOUS | Status: AC
  Administered 2021-04-12 – 2021-04-16 (×5): 500 mg via INTRAVENOUS
  Filled 2021-04-12 (×5): qty 5

## 2021-04-12 MED ORDER — MONTELUKAST SODIUM 10 MG PO TABS
10.0000 mg | ORAL_TABLET | Freq: Every day | ORAL | Status: DC
  Administered 2021-04-12 – 2021-04-18 (×7): 10 mg via ORAL
  Filled 2021-04-12 (×7): qty 1

## 2021-04-12 MED ORDER — GABAPENTIN 100 MG PO CAPS
100.0000 mg | ORAL_CAPSULE | Freq: Every day | ORAL | Status: DC
  Administered 2021-04-12 – 2021-04-17 (×6): 100 mg via ORAL
  Filled 2021-04-12 (×6): qty 1

## 2021-04-12 MED ORDER — LOSARTAN POTASSIUM 50 MG PO TABS
100.0000 mg | ORAL_TABLET | Freq: Two times a day (BID) | ORAL | Status: DC
  Administered 2021-04-12 – 2021-04-18 (×13): 100 mg via ORAL
  Filled 2021-04-12 (×13): qty 2

## 2021-04-12 MED ORDER — ALBUTEROL SULFATE (2.5 MG/3ML) 0.083% IN NEBU
2.5000 mg | INHALATION_SOLUTION | Freq: Four times a day (QID) | RESPIRATORY_TRACT | Status: DC
  Administered 2021-04-12 (×2): 2.5 mg via RESPIRATORY_TRACT
  Filled 2021-04-12 (×2): qty 3

## 2021-04-12 MED ORDER — ALBUTEROL SULFATE (2.5 MG/3ML) 0.083% IN NEBU
2.5000 mg | INHALATION_SOLUTION | RESPIRATORY_TRACT | Status: DC | PRN
  Administered 2021-04-12: 2.5 mg via RESPIRATORY_TRACT

## 2021-04-12 MED ORDER — MOMETASONE FURO-FORMOTEROL FUM 200-5 MCG/ACT IN AERO
2.0000 | INHALATION_SPRAY | Freq: Two times a day (BID) | RESPIRATORY_TRACT | Status: DC
  Administered 2021-04-13 – 2021-04-14 (×3): 2 via RESPIRATORY_TRACT
  Filled 2021-04-12: qty 8.8

## 2021-04-12 MED ORDER — GUAIFENESIN ER 600 MG PO TB12
600.0000 mg | ORAL_TABLET | Freq: Two times a day (BID) | ORAL | Status: DC
  Administered 2021-04-12 – 2021-04-18 (×10): 600 mg via ORAL
  Filled 2021-04-12 (×11): qty 1

## 2021-04-12 MED ORDER — PREDNISONE 5 MG PO TABS
5.0000 mg | ORAL_TABLET | Freq: Every day | ORAL | Status: DC
  Administered 2021-04-12 – 2021-04-13 (×2): 5 mg via ORAL
  Filled 2021-04-12 (×2): qty 1

## 2021-04-12 MED ORDER — RIVAROXABAN 15 MG PO TABS
15.0000 mg | ORAL_TABLET | Freq: Every day | ORAL | Status: DC
  Administered 2021-04-12 – 2021-04-17 (×6): 15 mg via ORAL
  Filled 2021-04-12 (×6): qty 1

## 2021-04-12 MED ORDER — LORATADINE 10 MG PO TABS
10.0000 mg | ORAL_TABLET | Freq: Every day | ORAL | Status: DC
  Administered 2021-04-12 – 2021-04-18 (×7): 10 mg via ORAL
  Filled 2021-04-12 (×7): qty 1

## 2021-04-12 MED ORDER — ACETAMINOPHEN 325 MG PO TABS: 650.0000 mg | ORAL_TABLET | Freq: Four times a day (QID) | ORAL | Status: DC | PRN

## 2021-04-12 MED ORDER — FUROSEMIDE 10 MG/ML IJ SOLN: 40.0000 mg | Freq: Every day | INTRAMUSCULAR | Status: DC

## 2021-04-12 MED ORDER — LATANOPROST 0.005 % OP SOLN
1.0000 [drp] | Freq: Every day | OPHTHALMIC | Status: DC
  Administered 2021-04-12 – 2021-04-17 (×6): 1 [drp] via OPHTHALMIC
  Filled 2021-04-12: qty 2.5

## 2021-04-12 MED ORDER — FAMOTIDINE 20 MG PO TABS: 40.0000 mg | ORAL_TABLET | Freq: Every day | ORAL | Status: DC

## 2021-04-12 MED ORDER — ACETAMINOPHEN 650 MG RE SUPP: 650.0000 mg | Freq: Four times a day (QID) | RECTAL | Status: DC | PRN

## 2021-04-12 NOTE — Progress Notes (Signed)
[facility] PROGRESS NOTE   Jody Blake CNO:[id number] DOB: [birth year] DOA: 04/11/2021  PCP: [name], MD  Brief History/Interval Summary: 88 y.o. female with history of A. fib status post ablation and pacemaker placement, congestive heart failure last EF measured was more than 75% in 2020, chronic bronchitis, hypothyroidism, GERD has been experiencing increasing shortness of breath with productive cough and wheezing for 2 days which has been gradually worsening.  Patient was tested for COVID which was negative at the facility.  Despite therapy at the facility patient was getting more short of breath and was referred to the ER.  Imaging studies in the emergency department revealed a multifocal pneumonia with bilateral pleural effusion.  BNP was noted to be elevated.  There was concern the patient had both pneumonia as well as CHF.  She was hospitalized for further management.  Reason for Visit: Community-acquired pneumonia.  Acute diastolic CHF  Consultants: None  Procedures: None    Subjective/Interval History: Patient very hard of hearing.  Also has vision impairment.  Seems irritable this morning.  Has been feeling awful as a result of persistent cough that she has had for 2 days now.  Denies any chest pain.  No nausea vomiting.    Assessment/Plan:  Community-acquired pneumonia/acute respiratory failure with hypoxia CT scan showed multifocal pneumonia.  COVID 19 PCR as well as influenza PCR's were negative.  Patient with productive cough for more than 2 days prior to admission.  Currently on ceftriaxone and azithromycin.  Also noted to be on vancomycin.  MRSA PCR is negative.  We will stop vancomycin. Be on nasal cannula oxygen at 2 to 3 L/min.  Saturating in the mid 90s.  Does not appear to be on home oxygen.  Monitor for improvement and hopefully will be able to wean her off of oxygen prior to discharge.  Acute on chronic diastolic CHF/moderate aortic  stenosis Echocardiogram has been ordered and is pending.  She was placed on IV diuretics.  Wait on echocardiogram.  Hold off on further doses of furosemide for now. Mildly elevated troponin levels noted which could be reflective of mild demand ischemia from respiratory issues.  Patient denies any chest pain.  Chronic bronchitis/acute wheezing Noted to have scattered wheezing bilaterally.  Continue with her inhalers.  Continue with nebulizer treatments. She is chronically on steroid which is being continued.  If wheezing does not improve we may increase the dose of her prednisone.  History of atrial fibrillation status post ablation/pacemaker placement Stable.  Continue metoprolol.  Also on Xarelto.  Hypothyroidism On levothyroxine.  TSH noted to be 0.317 which could be reflective of sick euthyroid.  We will check free T4 tomorrow.  Normocytic anemia Stable hemoglobin noted.  Check anemia panel.  Hypokalemia Will be repleted.   DVT Prophylaxis: Xarelto Code Status: DNR Family Communication: No family at bedside Disposition Plan: Hopefully return back to her retirement facility when improved.  Will need PT and OT evaluation.  Status is: Observation  The patient will require care spanning > 2 midnights and should be moved to inpatient because: Acute respiratory failure with hypoxia, community-acquired pneumonia requiring IV antibiotic     Medications: Scheduled:  albuterol  2.5 mg Nebulization Q6H   famotidine  40 mg Oral QHS   gabapentin  100 mg Oral QHS   latanoprost  1 drop Both Eyes QHS   levothyroxine  88 mcg Oral QAC breakfast   loratadine  10 mg Oral Daily   losartan  100 mg  Oral BID   metoprolol succinate  50 mg Oral Daily   mometasone-formoterol  2 puff Inhalation BID   montelukast  10 mg Oral Daily   predniSONE  5 mg Oral Q breakfast   Rivaroxaban  15 mg Oral Q supper   Continuous:  azithromycin 500 mg (04/12/21 1039)   cefTRIAXone (ROCEPHIN)  IV 2 g (04/12/21  2458)   [id number] **OR** acetaminophen, albuterol, albuterol  Antibiotics: Anti-infectives (From admission, onward)    Start     Dose/Rate Route Frequency Ordered Stop   04/13/21 1000  vancomycin (VANCOREADY) IVPB 1250 mg/250 mL  Status:  Discontinued        1,250 mg 166.7 mL/hr over 90 Minutes Intravenous Every 48 hours 04/11/21 1053 04/12/21 1142   04/12/21 1000  cefTRIAXone (ROCEPHIN) 2 g in sodium chloride 0.9 % 100 mL IVPB        2 g 200 mL/hr over 30 Minutes Intravenous Every 24 hours 04/12/21 0415 04/17/21 0959   04/12/21 1000  azithromycin (ZITHROMAX) 500 mg in sodium chloride 0.9 % 250 mL IVPB        500 mg 250 mL/hr over 60 Minutes Intravenous Every 24 hours 04/12/21 0415 04/17/21 0959   04/11/21 1000  cefTRIAXone (ROCEPHIN) 1 g in sodium chloride 0.9 % 100 mL IVPB        1 g 200 mL/hr over 30 Minutes Intravenous  Once 04/11/21 0949 04/11/21 1336   04/11/21 1000  azithromycin (ZITHROMAX) 500 mg in sodium chloride 0.9 % 250 mL IVPB        500 mg 250 mL/hr over 60 Minutes Intravenous  Once 04/11/21 0949 04/11/21 1338   04/11/21 1000  vancomycin (VANCOCIN) IVPB 1000 mg/200 mL premix        1,000 mg 200 mL/hr over 60 Minutes Intravenous  Once 04/11/21 0950 04/11/21 1443   04/11/21 0945  levofloxacin (LEVAQUIN) IVPB 750 mg  Status:  Discontinued        750 mg 100 mL/hr over 90 Minutes Intravenous  Once 04/11/21 0937 04/11/21 0949       Objective:  Vital Signs  Vitals:   04/12/21 0013 04/12/21 0315 04/12/21 0442 04/12/21 0734  BP: (!) 146/69 (!) 179/76    Pulse: 60 65  72  Resp: (!) 25 (!) 21  16  Temp: 97.6 F (36.4 C) 98 F (36.7 C)    TempSrc: Oral     SpO2: 96% 93%    Weight:   58.6 kg   Height:        Intake/Output Summary (Last 24 hours) at 04/12/2021 1144 Last data filed at 04/12/2021 0855 Gross per 24 hour  Intake 800.27 ml  Output 1000 ml  Net -199.73 ml   Filed Weights   04/11/21 2106 04/12/21 0442  Weight: 58.6 kg 58.6 kg    General  appearance: Awake alert.  In no distress Resp: Mildly tachypneic.  No use of accessory muscles.  Crackles bilaterally at the bases.  Scattered wheezing bilaterally. Cardio: S1-S2 is normal regular.  No S3-S4.  No rubs murmurs or bruit GI: Abdomen is soft.  Nontender nondistended.  Bowel sounds are present normal.  No masses organomegaly Extremities: No edema.  Able to lift both legs off the bed Neurologic: No focal neurological deficits.    Lab Results:  Data Reviewed: I have personally reviewed following labs and imaging studies  CBC: Recent Labs  Lab 04/11/21 0950 04/12/21 0426  WBC 11.7* 13.4*  NEUTROABS 9.7* 10.7*  HGB 11.7* 11.5*  HCT  36.7 36.0  MCV 98.4 98.4  PLT 271 638    Basic Metabolic Panel: Recent Labs  Lab 04/11/21 0950 04/12/21 0426  NA 136 137  K 3.6 3.0*  CL 99 99  CO2 26 26  GLUCOSE 148* 107*  BUN 19 16  CREATININE 0.93 1.03*  CALCIUM 9.3 8.9  MG  --  2.1    GFR: Estimated Creatinine Clearance: 28.2 mL/min (A) (by C-G formula based on SCr of 1.03 mg/dL (H)).  Liver Function Tests: Recent Labs  Lab 04/11/21 0950  AST 30  ALT 37  ALKPHOS 99  BILITOT 1.0  PROT 6.8  ALBUMIN 3.6     Coagulation Profile: Recent Labs  Lab 04/11/21 0950  INR 1.8*     Thyroid Function Tests: Recent Labs    04/12/21 0426  TSH 0.317*    Anemia Panel: No results for input(s): VITAMINB12, FOLATE, FERRITIN, TIBC, IRON, RETICCTPCT in the last 72 hours.  Recent Results (from the past 240 hour(s))  SARS-COV-2 RNA,(COVID-19) QUAL NAAT     Status: None   Collection Time: 04/10/21  2:44 PM  Result Value Ref Range Status   SARS CoV2 RNA NOT DETECTED NOT DETECTED Final    Comment: . A Not Detected result means that SARS-CoV-2 RNA was not present in the specimen above the limit of detection. . A Not Detected result does not rule out the possibility of COVID-19 and should not be used as the sole basis for treatment or patient management decisions. If  COVID-19 is still suspected, based on exposure history together with other clinical findings, re-testing should be  considered in the context of clinical observations and epidemiological data for patient management decisions. . Test Method: Nucleic Acid Amplification Test including reverse transcription polymerase chain reaction (RT-PCR) and transcription mediated amplification (TMA). The test method meets the [place] Centers for Disease Control and prevention (CDC) pre departure and arrival requirement for viral test for COVID-19 dated May 06, 2019. Testing requirements for traveling may change with time. The patient is responsible for determining the test requirements for each nation while they ar e traveling. . This test has been authorized by the FDA under an  Emergency Use Authorization (EUA) for use by authorized laboratories. . Please review the "Fact Sheets" and FDA authorized labeling available for health care providers and patients using the following websites: https://www.[email or web address]/home/Covid-19/[email or web address]/fact-sheet2  [email or web address]://[email or web address]/home/Covid-19/Patients/NAAT/ fact-sheet2 . Due to the current public health emergency, [facility] is accepting samples from appropriate clinical sources collected using wide variety of swabs and transport media for COVID-19. Not detected test results derived from specimens received in non- commercially manufactured viral collection kits or those not yet authorized by FDA for COVID-19 testing should be cautiously evaluated and take extra precautions such as additional clinical monitoring, including collection of an additional specimen. . Additional information about COVID-19 can be fo und at the [employer] website: www.[email or web address].com/Covid19. . For patients with a Detected or Inconclusive test result, please see CDC's COVID-19 Treatments and  Medications page located at   BroadcastLocal.cz- severe-illness.html for information on COVID-19 therapeutics. . For patients with a Not Detected test result, please see CDC's Vaccines for COVID-19 page located at PhoneStatistics.is for information on COVID-19 vaccines.   Resp Panel by RT-PCR (Flu A&B, Covid) Nasopharyngeal Swab     Status: None   Collection Time: 04/11/21  9:13 AM   Specimen: Nasopharyngeal Swab; Nasopharyngeal(NP) swabs in vial transport medium  Result Value Ref Range Status   SARS Coronavirus 2 by RT PCR NEGATIVE NEGATIVE  Final    Comment: (NOTE) SARS-CoV-2 target nucleic acids are NOT DETECTED.  The SARS-CoV-2 RNA is generally detectable in upper respiratory specimens during the acute phase of infection. The lowest concentration of SARS-CoV-2 viral copies this assay can detect is 138 copies/mL. A negative result does not preclude SARS-Cov-2 infection and should not be used as the sole basis for treatment or other patient management decisions. A negative result may occur with  improper specimen collection/handling, submission of specimen other than nasopharyngeal swab, presence of viral mutation(s) within the areas targeted by this assay, and inadequate number of viral copies(<138 copies/mL). A negative result must be combined with clinical observations, patient history, and epidemiological information. The expected result is Negative.  Fact Sheet for Patients:  EntrepreneurPulse.com.au  Fact Sheet for Healthcare Providers:  IncredibleEmployment.be  This test is no t yet approved or cleared by the [place] FDA and  has been authorized for detection and/or diagnosis of SARS-CoV-2 by FDA under an Emergency Use Authorization (EUA). This EUA will remain  in effect (meaning this test can be used) for the duration of the COVID-19 declaration under Section  564(b)(1) of the Act, 21 U.S.C.section 360bbb-3(b)(1), unless the authorization is terminated  or revoked sooner.       Influenza A by PCR NEGATIVE NEGATIVE Final   Influenza B by PCR NEGATIVE NEGATIVE Final    Comment: (NOTE) The Xpert Xpress SARS-CoV-2/FLU/RSV plus assay is intended as an aid in the diagnosis of influenza from Nasopharyngeal swab specimens and should not be used as a sole basis for treatment. Nasal washings and aspirates are unacceptable for Xpert Xpress SARS-CoV-2/FLU/RSV testing.  Fact Sheet for Patients: EntrepreneurPulse.com.au  Fact Sheet for Healthcare Providers: IncredibleEmployment.be  This test is not yet approved or cleared by the [place] FDA and has been authorized for detection and/or diagnosis of SARS-CoV-2 by FDA under an Emergency Use Authorization (EUA). This EUA will remain in effect (meaning this test can be used) for the duration of the COVID-19 declaration under Section 564(b)(1) of the Act, 21 U.S.C. section 360bbb-3(b)(1), unless the authorization is terminated or revoked.  Performed at [facility], [address], [place], [place] [address]   Blood Culture (routine x 2)     Status: None (Preliminary result)   Collection Time: 04/11/21  9:50 AM   Specimen: Right Antecubital; Blood  Result Value Ref Range Status   Specimen Description   Final    RIGHT ANTECUBITAL Performed at [facility], [address] [place], [place] [address]    Special Requests   Final    BOTTLES DRAWN AEROBIC AND ANAEROBIC Blood Culture results may not be optimal due to an excessive volume of blood received in culture bottles Performed at [facility], [address], [place], [place] [address]    Culture   Final    NO GROWTH < 24 HOURS Performed at [facility], [place] [address] [place], [place] [address]    Report Status PENDING  Incomplete  Urine  Culture     Status: Abnormal (Preliminary result)   Collection Time: 04/11/21  9:50 AM   Specimen: In/Out Cath Urine  Result Value Ref Range Status   Specimen Description   Final    IN/OUT CATH URINE Performed at [facility], [address], [place], [place] [address]    Special Requests   Final    NONE Performed at [facility], [address] [place], [place] [address]    Culture (A)  Final    80,000 COLONIES/mL GRAM NEGATIVE RODS IDENTIFICATION AND SUSCEPTIBILITIES TO FOLLOW Performed at [facility], [place] [address], [place], [place] [address]    Report Status PENDING  Incomplete  Culture, blood (Routine X 2) w Reflex to ID Panel     Status: None (Preliminary result)   Collection Time: 04/11/21 10:10 AM   Specimen: BLOOD LEFT FOREARM  Result Value Ref Range Status   Specimen Description   Final    BLOOD LEFT FOREARM Performed at [facility], [address], [place], [place] [address]    Special Requests   Final    BOTTLES DRAWN AEROBIC AND ANAEROBIC Blood Culture results may not be optimal due to an excessive volume of blood received in culture bottles Performed at [facility], [address] [place], [place] [address]    Culture   Final    NO GROWTH < 24 HOURS Performed at [facility], [place] [address], [place], [place] [address]    Report Status PENDING  Incomplete  MRSA Next Gen by PCR, Nasal     Status: None   Collection Time: 04/12/21  4:14 AM   Specimen: Nasal Mucosa; Nasal Swab  Result Value Ref Range Status   MRSA by PCR Next Gen NOT DETECTED NOT DETECTED Final    Comment: (NOTE) The GeneXpert MRSA Assay (FDA approved for NASAL specimens only), is one component of a comprehensive MRSA colonization surveillance program. It is not intended to diagnose MRSA infection nor to guide or monitor treatment for MRSA infections. Test performance is not FDA approved in patients  less than 9 years old. Performed at [facility], [place] [address] [place], [place] [address]       Radiology Studies: CT Angio Chest PE W and/or Wo Contrast  Result Date: 04/11/2021 CLINICAL DATA:  Shortness of breath. EXAM: CT ANGIOGRAPHY CHEST WITH CONTRAST TECHNIQUE: Multidetector CT imaging of the chest was performed using the standard protocol during bolus administration of intravenous contrast. Multiplanar CT image reconstructions and MIPs were obtained to evaluate the vascular anatomy. CONTRAST:  35mL OMNIPAQUE IOHEXOL 350 MG/ML SOLN COMPARISON:  October 20, 2006. FINDINGS: Cardiovascular: Satisfactory opacification of the pulmonary arteries to the segmental level. No evidence of pulmonary embolism. Mild cardiomegaly is noted. Coronary calcifications are noted. No pericardial effusion. Atherosclerosis of thoracic aorta is noted without aneurysm formation. Mediastinum/Nodes: No enlarged mediastinal, hilar, or axillary lymph nodes. Thyroid gland, trachea, and esophagus demonstrate no significant findings. Lungs/Pleura: No pneumothorax is noted. Small bilateral pleural effusions are noted with adjacent sub some atelectasis, left greater than right. Multiple patchy airspace opacities are noted consistent with multifocal pneumonia. Upper Abdomen: No acute abnormality. Musculoskeletal: No chest wall abnormality. No acute or significant osseous findings. Review of the MIP images confirms the above findings. IMPRESSION: No definite evidence of pulmonary embolus. Multiple patchy airspace opacities are noted bilaterally consistent with multifocal pneumonia. Small bilateral pleural effusions are noted with adjacent subsegmental atelectasis, left greater than right. Coronary calcifications are noted suggesting coronary disease. Aortic Atherosclerosis (ICD10-I70.0). Electronically Signed   By: Marijo Conception M.D.   On: 04/11/2021 12:54   DG Chest Port 1 View  Result Date: 04/11/2021 CLINICAL DATA:   Shortness of breath EXAM: PORTABLE CHEST 1 VIEW COMPARISON:  May 2022 FINDINGS: Ill-defined patchy opacities bilaterally. Probable Kerley B lines. Left lung base pleuroparenchymal opacity. Stable cardiomediastinal contours with mild cardiomegaly. No pneumothorax. IMPRESSION: Ill-defined patchy opacities bilaterally and left basilar opacification. Favor edema given  probable Kerley B-lines. Possible small left pleural effusion. Atypical/viral pneumonia is also a consideration. Electronically Signed   By: Macy Mis M.D.   On: 04/11/2021 09:31       LOS: 0 days   [facility] Pager on [email or web address]  04/12/2021, 11:44 AM

## 2021-04-12 NOTE — Evaluation (Signed)
Occupational Therapy Evaluation Patient Details Name: Jody Blake MRN: [id number] DOB: June 25, 1925 Today's Date: 04/12/2021   History of Present Illness 88 y.o. female presenting to ED 1/4 with worsening SOB, productive cough and wheezing x2 days. COVID-19 PCR (-). Imaging (+) multifocal pneumonia with bilateral pleural effusion. Patient also found to have acute on chronic CHF exacerbation. PMHx significant for A-fib status post ablation and pacemaker placement, CHF, chronic bronchitis, HTN, thyroid disease, and GERD.   Clinical Impression   PTA patient was living alone in an ILF apartment at [facility] and was grossly Mod I with ADLs/IADLs with use of AD. Patient has low vision and is HOH at baseline. PLOF and home set-up obtained from daughter via phone call. Patient currently functioning below baseline demonstrating observed ADLs including toileting/hygiene/clothing management with Min A overall. Patient also limited by deficits listed below including generalized weakness, decreased dynamic standing balance and decreased cardiopulmonary status requiring continuous supplemental O2 via [place] and would benefit from continued acute OT services in prep for safe d/c to next level of care. Both patient and family in agreement with short-term rehab at [facility] with eventual transition to assisted living. OT will continue to follow acutely.        Recommendations for follow up therapy are one component of a multi-disciplinary discharge planning process, led by the attending physician.  Recommendations may be updated based on patient status, additional functional criteria and insurance authorization.   Follow Up Recommendations  Skilled nursing-short term rehab (<3 hours/day)    Assistance Recommended at Discharge Frequent or constant Supervision/Assistance  Patient can return home with the following      Functional Status Assessment  Patient has had a recent decline in their functional status and  demonstrates the ability to make significant improvements in function in a reasonable and predictable amount of time.  Equipment Recommendations  Other (comment) (Defer to next level of care.)    Recommendations for Other Services       Precautions / Restrictions Precautions Precautions: Fall Precaution Comments: HOH; low vision Restrictions Weight Bearing Restrictions: No      Mobility Bed Mobility Overal bed mobility: Needs Assistance Bed Mobility: Supine to Sit     Supine to sit: Min assist;HOB elevated     General bed mobility comments: Able to advance BLE toward EOB without external assist. Min A to elevate trunk.    Transfers Overall transfer level: Needs assistance Equipment used: Rolling walker (2 wheels) Transfers: Sit to/from Stand;Bed to chair/wheelchair/BSC Sit to Stand: Min assist Stand pivot transfers: Min assist         General transfer comment: Min A for sit to stand from EOB to RW. Mild posterior bias. Min A for stand-pivot to BSC > recliner with RW. Cues for hand placement and proximity.      Balance Overall balance assessment: Needs assistance Sitting-balance support: No upper extremity supported;Feet supported Sitting balance-Leahy Scale: Fair   Postural control: Posterior lean Standing balance support: Bilateral upper extremity supported;During functional activity;Reliant on assistive device for balance Standing balance-Leahy Scale: Poor Standing balance comment: Reliant on at least unilateral UE support during hygiene/clothing management. BUE support required with mobility.                           ADL either performed or assessed with clinical judgement   ADL Overall ADL's : Needs assistance/impaired Eating/Feeding: Set up;Sitting   Grooming: Minimal assistance;Sitting   Upper Body Bathing: Minimal assistance;Sitting  Lower Body Bathing: Minimal assistance;Sit to/from stand   Upper Body Dressing : Set up;Sitting    Lower Body Dressing: Minimal assistance;Sit to/from stand   Toilet Transfer: Minimal assistance;Rolling walker (2 wheels) Toilet Transfer Details (indicate cue type and reason): To BSC with use of RW. Cues for hand placement and proximity to RW. Toileting- Clothing Manipulation and Hygiene: Minimal assistance;Sit to/from stand Toileting - Clothing Manipulation Details (indicate cue type and reason): Min A for hygiene/clothing management.             Vision Baseline Vision/History: 4 Cataracts;6 Macular Degeneration Ability to See in Adequate Light: 4 Severely impaired Patient Visual Report: No change from baseline Additional Comments: Low vision at baseline.     Perception     Praxis      Pertinent Vitals/Pain Pain Assessment: No/denies pain     Hand Dominance Right   Extremity/Trunk Assessment Upper Extremity Assessment Upper Extremity Assessment: Generalized weakness   Lower Extremity Assessment Lower Extremity Assessment: Defer to PT evaluation   Cervical / Trunk Assessment Cervical / Trunk Assessment: Kyphotic   Communication Communication Communication: HOH   Cognition Arousal/Alertness: Awake/alert Behavior During Therapy: WFL for tasks assessed/performed Overall Cognitive Status: Within Functional Limits for tasks assessed                                 General Comments: Daughter reports patient is "as sharp as a tack".     General Comments       Exercises     Shoulder Instructions      Home Living Family/patient expects to be discharged to:: Private residence Living Arrangements: Alone Available Help at Discharge: Family Type of Home: Independent living facility ([facility] (apartment)) Home Access: Level entry     Home Layout: One level     Bathroom Shower/Tub: Hospital doctor Toilet: Handicapped height     Home Equipment: Conservation officer, nature (2 wheels);Rollator (4 wheels)          Prior  Functioning/Environment Prior Level of Function : Independent/Modified Independent             Mobility Comments: RW in household and rollator in community dwellings. ADLs Comments: Mod I with ADLs/IADLs; prepares her own breakfast and lunch. Dinner in [employer]. Facility staff assist with med management but patient takes pills daily from pillbox. Hired help for light housekeeping and laundry.        OT Problem List: Decreased strength;Decreased activity tolerance;Impaired balance (sitting and/or standing);Impaired vision/perception;Decreased knowledge of use of DME or AE;Cardiopulmonary status limiting activity      OT Treatment/Interventions: Self-care/ADL training;Therapeutic exercise;Energy conservation;DME and/or AE instruction;Therapeutic activities;Patient/family education;Balance training    OT Goals(Current goals can be found in the care plan section) Acute Rehab OT Goals Patient Stated Goal: Per patient/family for patient to go to rehab at [facility] OT Goal Formulation: With patient/family Time For Goal Achievement: 04/26/21 Potential to Achieve Goals: Good ADL Goals Pt Will Perform Grooming: standing;with modified independence Pt Will Perform Upper Body Dressing: sitting;with modified independence Pt Will Perform Lower Body Dressing: with modified independence;sit to/from stand Pt Will Transfer to Toilet: with modified independence;bedside commode Pt Will Perform Toileting - Clothing Manipulation and hygiene: with modified independence;sit to/from stand Pt Will Perform Tub/Shower Transfer: Shower transfer;with modified independence;shower seat Pt/caregiver will Perform Home Exercise Program: Increased strength;Both right and left upper extremity;With written HEP provided  OT Frequency: Min 2X/week  Co-evaluation              AM-PAC OT "6 Clicks" Daily Activity     Outcome Measure Help from another person eating meals?: None Help from another person taking  care of personal grooming?: A Little Help from another person toileting, which includes using toliet, bedpan, or urinal?: A Little Help from another person bathing (including washing, rinsing, drying)?: A Little Help from another person to put on and taking off regular upper body clothing?: A Little Help from another person to put on and taking off regular lower body clothing?: A Little 6 Click Score: 19   End of Session Equipment Utilized During Treatment: Gait belt;Rolling walker (2 wheels) Nurse Communication: Mobility status  Activity Tolerance: Patient tolerated treatment well Patient left: in chair;with call bell/phone within reach;with chair alarm set  OT Visit Diagnosis: Unsteadiness on feet (R26.81);Muscle weakness (generalized) (M62.81)                Time: 5188-4166 OT Time Calculation (min): 27 min Charges:  OT General Charges $OT Visit: 1 Visit OT Evaluation $OT Eval Moderate Complexity: 1 Mod OT Treatments $Self Care/Home Management : 8-22 mins  Joellen Tullos H. OTR/L Supplemental OT, Department of rehab services [phone]  Glendon Fiser R H. 04/12/2021, 1:56 PM

## 2021-04-12 NOTE — H&P (Signed)
History and Physical    Jody Blake YBO:[id number] DOB: [birth year] DOA: 04/11/2021  PCP: [name], MD  Patient coming from: [place].  Chief Complaint: Shortness of breath.  HPI: Jody Blake is a 88 y.o. female with history of A. fib status post ablation and pacemaker placement, congestive heart failure last EF measured was more than 75% in 2020, chronic bronchitis, hypothyroidism, GERD has been experiencing increasing shortness of breath with productive cough and wheezing for the last 2 days which has been gradually worsening.  Patient was tested for COVID which was negative at the facility.  Despite therapy at the facility patient was getting more short of breath and was referred to the ER.  ED Course: In the ER patient's D-dimer was elevated and had a CT angiogram of the chest which shows bilateral multifocal pneumonia with bilateral pleural effusion.  Labs show BNP of 451 high sensitive troponin of 18 and 31.  Hemoglobin 11.7 WBC of 11.7 lactic acid 1.6 COVID test and flu test were negative patient was given Lasix a total of 80 mg IV and also started on empiric antibiotics for pneumonia admitted for further work-up.  Review of Systems: As per HPI, rest all negative.   Past Medical History:  Diagnosis Date   Anemia    Aortic stenosis    a. Echo 09/06/12 EF 55-60%, no WMA, G2DD, Ao valve sclerosis w/ mod stenosis, LA mildly dilated, PA pressure 43mmHg   Asthmatic bronchitis    Complete heart block (HCC)    s/p permanent pacemaker 06/27/1999 (Battery change 06/2007 and 2016).  s/p AV nodal ablation by Dr [name] 2016.   COPD (chronic obstructive pulmonary disease) (HCC)    Dr. [name]   DDD (degenerative disc disease)    Diastolic dysfunction    a. Echo 09/06/12 EF 55-60%, no WMA, G2DD, Ao valve sclerosis w/ mod stenosis, LA mildly dilated, PA pressure 59mmHg   Diverticulosis    DVT (deep vein thrombosis) in pregnancy 1954   a. LLE   Hypertension    Hypothyroidism    on  medication   Macular degeneration    Dr. [name]   Osteoarthrosis, unspecified whether generalized or localized, other specified sites    PAF (paroxysmal atrial fibrillation) ([place])    Stopped flecainide, on amiodarone but still has bouts of A FIB (mostly in mornings).    Pure hypercholesterolemia    Staghorn calculus    Left    Past Surgical History:  Procedure Laterality Date   APPENDECTOMY  ~ 1941   AV NODE ABLATION  05/10/2014   AV NODE ABLATION N/A 05/10/2014   Procedure: AV NODE ABLATION;  Surgeon: Thompson Grayer, MD;  Location: [facility] CATH LAB;  Service: Cardiovascular;  Laterality: N/A;   BUNIONECTOMY WITH HAMMERTOE RECONSTRUCTION Bilateral ~ [place]  06/27/99   Medtronic PM implanted by Dr [name]   CARDIOVERSION N/A 12/02/2013   Procedure: CARDIOVERSION;  Surgeon: [name], MD;  Location: [facility];  Service: Cardiovascular;  Laterality: N/A;   CATARACT EXTRACTION W/ INTRAOCULAR LENS  IMPLANT, BILATERAL Bilateral    COLONOSCOPY WITH PROPOFOL N/A 04/22/2013   Procedure: COLONOSCOPY WITH PROPOFOL;  Surgeon: [name], MD;  Location: [facility] ENDOSCOPY;  Service: Endoscopy;  Laterality: N/A;   DILATION AND CURETTAGE OF UTERUS  X 2   "when I was going thru menopause"   ESOPHAGOGASTRODUODENOSCOPY (EGD) WITH PROPOFOL N/A 04/22/2013   Procedure: ESOPHAGOGASTRODUODENOSCOPY (EGD) WITH PROPOFOL;  Surgeon: [name], MD;  Location:  [facility] ENDOSCOPY;  Service: Endoscopy;  Laterality: N/A;   HERNIA REPAIR     INCISIONAL HERNIA REPAIR     INSERT / REPLACE / REMOVE PACEMAKER  06/2007   "took out the old; put in new"   INSERT / REPLACE / REMOVE PACEMAKER  05/10/2014   MDT PPM generator change by Dr [name]   JOINT REPLACEMENT     PARTIAL NEPHRECTOMY Left 05/1974   stone disease   PERMANENT PACEMAKER GENERATOR CHANGE N/A 05/10/2014   Procedure: PERMANENT PACEMAKER GENERATOR CHANGE;  Surgeon: Thompson Grayer, MD;  Location: [facility] C[facility];  Service: Cardiovascular;  Laterality:  N/A;   TONSILLECTOMY AND ADENOIDECTOMY  1930's   TOTAL KNEE ARTHROPLASTY Right 2001     reports that she has never smoked. She has never used smokeless tobacco. She reports current alcohol use of about 7.0 standard drinks per week. She reports that she does not use drugs.  Allergies  Allergen Reactions   Brimonidine Tartrate-Timolol Other (See Comments)    REACTION: systemic malaise amigen eye drop   Penicillins Hives   Breo Ellipta [Fluticasone Furoate-Vilanterol] Other (See Comments)    Ran blood pressure up Increased blood pressure   Vancomycin Other (See Comments)    Red man syndrome - Mild redness and discomfort. Able to complete initial dose.     Family History  Problem Relation Age of Onset   Malignant hypertension Father    Hypertension Father    Renal Disease Father    Breast cancer Mother    Heart attack Brother    Stroke Brother     Prior to Admission medications   Medication Sig Start Date End Date Taking? Authorizing Provider  Azelastine HCl 137 MCG/SPRAY SOLN USE 1 TO 2 SPRAYS INTO BOTH NOSTRILS TWICE DAILY. 01/04/21  Yes Young, Tarri Fuller D, MD  Cholecalciferol (VITAMIN D) 125 MCG (5000 UT) CAPS Take 1 capsule by mouth daily.   Yes [provider]  famotidine (PEPCID) 40 MG tablet Take 1 tablet (40 mg total) by mouth at bedtime. 12/08/20  Yes Wert, Margreta Journey, NP  fluticasone (FLONASE) 50 MCG/ACT nasal spray USE 2 SPRAYS EACH NOSTRIL ONCE A DAY AS NEEDED FOR ALLERGIES OR CONGESTION. 01/04/21  Yes Young, Tarri Fuller D, MD  furosemide (LASIX) 20 MG tablet Take 20 mg by mouth 2 (two) times a week.   Yes [provider]  furosemide (LASIX) 40 MG tablet TAKE 1 AND 1/2 TABLETS (60MG ) BY MOUTH ONCE DAILY 03/12/21  Yes [name], MD  gabapentin (NEURONTIN) 100 MG capsule Take 1 capsule (100 mg total) by mouth at bedtime. 12/28/20  Yes Wert, Margreta Journey, NP  latanoprost (XALATAN) 0.005 % ophthalmic solution Place 1 drop into both eyes at bedtime.   Yes [provider]  levothyroxine (SYNTHROID) 88 MCG tablet TAKE 1 TABLET BY MOUTH  DAILY BEFORE BREAKFAST 03/29/19  Yes Reed, Tiffany L, DO  loratadine (CLARITIN) 10 MG tablet Take 1 tablet (10 mg total) by mouth daily. 12/18/20  Yes Royal Hawthorn, NP  losartan (COZAAR) 100 MG tablet Take 100 mg by mouth 2 (two) times daily.   Yes [provider]  Lutein 20 MG CAPS Take 1 capsule (20 mg total) by mouth daily. 11/22/20  Yes Wert, Margreta Journey, NP  Magnesium 250 MG TABS Take 1 tablet by mouth daily.   Yes [provider]  metoprolol succinate (TOPROL XL) 50 MG 24 hr tablet Take 1 tablet (50 mg total) by mouth daily. Take with or immediately following a meal. 03/22/21  Yes  [name], MD  montelukast (SINGULAIR) 10 MG tablet Take 1 tablet (10 mg total) by mouth daily. 12/28/20  Yes Royal Hawthorn, NP  Multiple Vitamins-Minerals (PRESERVISION AREDS 2) CAPS Take 1 capsule by mouth in the morning and at bedtime.    Yes [provider]  ofloxacin (OCUFLOX) 0.3 % ophthalmic solution Place 1 drop into both eyes 4 (four) times daily. 03/21/21  Yes [name], MD  potassium chloride (KLOR-CON M) 10 MEQ tablet Take 20 mEq by mouth daily.   Yes [provider]  predniSONE (DELTASONE) 5 MG tablet Take 1 tablet (5 mg total) by mouth daily with breakfast. 05/13/19  Yes Young, Clinton D, MD  Rivaroxaban (XARELTO) 15 MG TABS tablet TAKE 1 TABLET BY MOUTH  DAILY WITH SUPPER 05/10/19  Yes Jerline Pain, MD  SYMBICORT 160-4.5 MCG/ACT inhaler INHALE 2 PUFFS BY MOUTH  INTO THE LUNGS TWO TIMES  DAILY 02/17/19  Yes Young, Clinton D, MD  VENTOLIN HFA 108 (90 Base) MCG/ACT inhaler USE 2 PUFFS EVERY 6 HOURS AS NEEDED FOR SHORTNESS OF BREATH AND WHEEZING. 03/02/19  Yes Young, Clinton D, MD  doxycycline (VIBRA-TABS) 100 MG tablet Take 1 tablet (100 mg total) by mouth 2 (two) times daily for 10 days. 04/10/21 04/20/21  Ngetich, Nelda Bucks, NP    Physical Exam: Constitutional: Moderately built and  nourished. Vitals:   04/11/21 2015 04/11/21 2106 04/11/21 2115 04/12/21 0013  BP:  (!) 174/75  (!) 146/69  Pulse: 62 73  60  Resp: 20 (!) 31 20 (!) 25  Temp:  97.9 F (36.6 C)  97.6 F (36.4 C)  TempSrc:  Oral  Oral  SpO2: 99% 97%  96%  Weight:  58.6 kg    Height:  5\' 3"  (1.6 m)     Eyes: Anicteric no pallor. ENMT: No discharge from the ears eyes nose and mouth. Neck: JVD elevated no mass felt. Respiratory: Mild wheezing bilaterally. Cardiovascular: S1-S2 heard. Abdomen: Soft nontender bowel sound present. Musculoskeletal: Mild edema. Skin: No rash. Neurologic: Alert awake oriented time place and person.  Moves all extremities. Psychiatric: Appears normal.  Normal affect.   Labs on Admission: I have personally reviewed following labs and imaging studies  CBC: Recent Labs  Lab 04/11/21 0950  WBC 11.7*  NEUTROABS 9.7*  HGB 11.7*  HCT 36.7  MCV 98.4  PLT 151   Basic Metabolic Panel: Recent Labs  Lab 04/11/21 0950  NA 136  K 3.6  CL 99  CO2 26  GLUCOSE 148*  BUN 19  CREATININE 0.93  CALCIUM 9.3   GFR: Estimated Creatinine Clearance: 31.3 mL/min (by C-G formula based on SCr of 0.93 mg/dL). Liver Function Tests: Recent Labs  Lab 04/11/21 0950  AST 30  ALT 37  ALKPHOS 99  BILITOT 1.0  PROT 6.8  ALBUMIN 3.6   No results for input(s): LIPASE, AMYLASE in the last 168 hours. No results for input(s): AMMONIA in the last 168 hours. Coagulation Profile: Recent Labs  Lab 04/11/21 0950  INR 1.8*   Cardiac Enzymes: No results for input(s): CKTOTAL, CKMB, CKMBINDEX, TROPONINI in the last 168 hours. BNP (last 3 results) No results for input(s): PROBNP in the last 8760 hours. HbA1C: No results for input(s): HGBA1C in the last 72 hours. CBG: No results for input(s): GLUCAP in the last 168 hours. Lipid Profile: No results for input(s): CHOL, HDL, LDLCALC, TRIG, CHOLHDL, LDLDIRECT in the last 72 hours. Thyroid Function Tests: No results for input(s): TSH,  T4TOTAL, FREET4,  T3FREE, THYROIDAB in the last 72 hours. Anemia Panel: No results for input(s): VITAMINB12, FOLATE, FERRITIN, TIBC, IRON, RETICCTPCT in the last 72 hours. Urine analysis:    Component Value Date/Time   COLORURINE YELLOW 04/11/2021 0950   APPEARANCEUR CLEAR 04/11/2021 0950   LABSPEC 1.006 04/11/2021 0950   PHURINE 7.0 04/11/2021 0950   GLUCOSEU NEGATIVE 04/11/2021 0950   HGBUR NEGATIVE 04/11/2021 0950   BILIRUBINUR NEGATIVE 04/11/2021 0950   KETONESUR NEGATIVE 04/11/2021 0950   PROTEINUR NEGATIVE 04/11/2021 0950   UROBILINOGEN 0.2 01/26/2015 0325   NITRITE POSITIVE (A) 04/11/2021 0950   LEUKOCYTESUR TRACE (A) 04/11/2021 0950   Sepsis Labs: @LABRCNTIP (procalcitonin:4,lacticidven:4) ) Recent Results (from the past 240 hour(s))  SARS-COV-2 RNA,(COVID-19) QUAL NAAT     Status: None   Collection Time: 04/10/21  2:44 PM  Result Value Ref Range Status   SARS CoV2 RNA NOT DETECTED NOT DETECTED Final    Comment: . A Not Detected result means that SARS-CoV-2 RNA was not present in the specimen above the limit of detection. . A Not Detected result does not rule out the possibility of COVID-19 and should not be used as the sole basis for treatment or patient management decisions. If COVID-19 is still suspected, based on exposure history together with other clinical findings, re-testing should be  considered in the context of clinical observations and epidemiological data for patient management decisions. . Test Method: Nucleic Acid Amplification Test including reverse transcription polymerase chain reaction (RT-PCR) and transcription mediated amplification (TMA). The test method meets the [place] Centers for Disease Control and prevention (CDC) pre departure and arrival requirement for viral test for COVID-19 dated May 06, 2019. Testing requirements for traveling may change with time. The patient is responsible for determining the test requirements for each nation  while they ar e traveling. . This test has been authorized by the FDA under an  Emergency Use Authorization (EUA) for use by authorized laboratories. . Please review the "Fact Sheets" and FDA authorized labeling available for health care providers and patients using the following websites: https://www.[facility].com/home/Covid-19/HCP/NAAT/fact-sheet2  https://www.[facility].com/home/Covid-19/Patients/NAAT/ fact-sheet2 . Due to the current public health emergency, [facility] is accepting samples from appropriate clinical sources collected using wide variety of swabs and transport media for COVID-19. Not detected test results derived from specimens received in non- commercially manufactured viral collection kits or those not yet authorized by FDA for COVID-19 testing should be cautiously evaluated and take extra precautions such as additional clinical monitoring, including collection of an additional specimen. . Additional information about COVID-19 can be fo und at the [employer] website: www.[facility].com/Covid19. . For patients with a Detected or Inconclusive test result, please see CDC's COVID-19 Treatments and  Medications page located at  BroadcastLocal.cz- severe-illness.html for information on COVID-19 therapeutics. . For patients with a Not Detected test result, please see CDC's Vaccines for COVID-19 page located at PhoneStatistics.is for information on COVID-19 vaccines.   Resp Panel by RT-PCR (Flu A&B, Covid) Nasopharyngeal Swab     Status: None   Collection Time: 04/11/21  9:13 AM   Specimen: Nasopharyngeal Swab; Nasopharyngeal(NP) swabs in vial transport medium  Result Value Ref Range Status   SARS Coronavirus 2 by RT PCR NEGATIVE NEGATIVE Final    Comment: (NOTE) SARS-CoV-2 target nucleic acids are NOT DETECTED.  The SARS-CoV-2 RNA  is generally detectable in upper respiratory specimens during the acute phase of infection. The lowest concentration of SARS-CoV-2 viral copies this assay can detect is 138 copies/mL. A negative result does not preclude SARS-Cov-2  infection and should not be used as the sole basis for treatment or other patient management decisions. A negative result may occur with  improper specimen collection/handling, submission of specimen other than nasopharyngeal swab, presence of viral mutation(s) within the areas targeted by this assay, and inadequate number of viral copies(<138 copies/mL). A negative result must be combined with clinical observations, patient history, and epidemiological information. The expected result is Negative.  Fact Sheet for Patients:  EntrepreneurPulse.com.au  Fact Sheet for Healthcare Providers:  IncredibleEmployment.be  This test is no t yet approved or cleared by the [place] FDA and  has been authorized for detection and/or diagnosis of SARS-CoV-2 by FDA under an Emergency Use Authorization (EUA). This EUA will remain  in effect (meaning this test can be used) for the duration of the COVID-19 declaration under Section 564(b)(1) of the Act, 21 U.S.C.section 360bbb-3(b)(1), unless the authorization is terminated  or revoked sooner.       Influenza A by PCR NEGATIVE NEGATIVE Final   Influenza B by PCR NEGATIVE NEGATIVE Final    Comment: (NOTE) The Xpert Xpress SARS-CoV-2/FLU/RSV plus assay is intended as an aid in the diagnosis of influenza from Nasopharyngeal swab specimens and should not be used as a sole basis for treatment. Nasal washings and aspirates are unacceptable for Xpert Xpress SARS-CoV-2/FLU/RSV testing.  Fact Sheet for Patients: EntrepreneurPulse.com.au  Fact Sheet for Healthcare Providers: IncredibleEmployment.be  This test is not yet approved or cleared by the  [place] FDA and has been authorized for detection and/or diagnosis of SARS-CoV-2 by FDA under an Emergency Use Authorization (EUA). This EUA will remain in effect (meaning this test can be used) for the duration of the COVID-19 declaration under Section 564(b)(1) of the Act, 21 U.S.C. section 360bbb-3(b)(1), unless the authorization is terminated or revoked.  Performed at [facility], [address] [place], [place] [address]      Radiological Exams on Admission: CT Angio Chest PE W and/or Wo Contrast  Result Date: 04/11/2021 CLINICAL DATA:  Shortness of breath. EXAM: CT ANGIOGRAPHY CHEST WITH CONTRAST TECHNIQUE: Multidetector CT imaging of the chest was performed using the standard protocol during bolus administration of intravenous contrast. Multiplanar CT image reconstructions and MIPs were obtained to evaluate the vascular anatomy. CONTRAST:  97mL OMNIPAQUE IOHEXOL 350 MG/ML SOLN COMPARISON:  October 20, 2006. FINDINGS: Cardiovascular: Satisfactory opacification of the pulmonary arteries to the segmental level. No evidence of pulmonary embolism. Mild cardiomegaly is noted. Coronary calcifications are noted. No pericardial effusion. Atherosclerosis of thoracic aorta is noted without aneurysm formation. Mediastinum/Nodes: No enlarged mediastinal, hilar, or axillary lymph nodes. Thyroid gland, trachea, and esophagus demonstrate no significant findings. Lungs/Pleura: No pneumothorax is noted. Small bilateral pleural effusions are noted with adjacent sub some atelectasis, left greater than right. Multiple patchy airspace opacities are noted consistent with multifocal pneumonia. Upper Abdomen: No acute abnormality. Musculoskeletal: No chest wall abnormality. No acute or significant osseous findings. Review of the MIP images confirms the above findings. IMPRESSION: No definite evidence of pulmonary embolus. Multiple patchy airspace opacities are noted bilaterally consistent with  multifocal pneumonia. Small bilateral pleural effusions are noted with adjacent subsegmental atelectasis, left greater than right. Coronary calcifications are noted suggesting coronary disease. Aortic Atherosclerosis (ICD10-I70.0). Electronically Signed   By: Marijo Conception M.D.   On: 04/11/2021 12:54   DG Chest Port 1 View  Result Date: 04/11/2021 CLINICAL DATA:  Shortness of breath EXAM: PORTABLE CHEST 1 VIEW COMPARISON:  May 2022 FINDINGS: Ill-defined patchy opacities bilaterally. Probable  Kerley B lines. Left lung base pleuroparenchymal opacity. Stable cardiomediastinal contours with mild cardiomegaly. No pneumothorax. IMPRESSION: Ill-defined patchy opacities bilaterally and left basilar opacification. Favor edema given probable Kerley B-lines. Possible small left pleural effusion. Atypical/viral pneumonia is also a consideration. Electronically Signed   By: Macy Mis M.D.   On: 04/11/2021 09:31    EKG: Independently reviewed.  Patient shows A-V dissociation but has pacemaker.  Assessment/Plan Principal Problem:   Acute on chronic diastolic CHF (congestive heart failure) (HCC) Active Problems:   Essential hypertension   COPD mixed type [facility])   Cardiac pacemaker in situ   Hypothyroidism   CAP (community acquired pneumonia)    Patient symptoms are likely a combination of CHF and pneumonia. Acute on chronic diastolic CHF last EF measured was more than 75% with cardiology note stating that patient has moderate aortic stenosis we will continue with IV Lasix closely follow intake output metabolic panel check 2D echo for any progression of the aortic stenosis. Community-acquired pneumonia with CT scan showing multifocal pneumonia COVID test and flu test were negative.  Patient has persistent productive cough on my exam.  We will keep patient on empiric antibiotics check sputum cultures. History of chronic bronchitis presently wheezing we will continue with home dose of prednisone with  nebulizer therapy and inhalers. History of A. fib status post AV ablation and pacemaker placement on Toprol and Xarelto. Anemia follow CBC. Hypothyroidism on Synthroid.   DVT prophylaxis: Xarelto. Code Status: DNR. Family Communication: Discussed with patient. Disposition Plan: Back to facility when stable. Consults called: None. Admission status: Observation for   Rise Patience MD [facility] Pager [phone].  If 7PM-7AM, please contact night-coverage [email or web address] Password [email or web address]  04/12/2021, 4:15 AM

## 2021-04-12 NOTE — Progress Notes (Signed)
°  Echocardiogram 2D Echocardiogram has been performed.  Jody Blake 04/12/2021, 8:55 AM

## 2021-04-13 DIAGNOSIS — J189 Pneumonia, unspecified organism: Secondary | ICD-10-CM | POA: Diagnosis not present

## 2021-04-13 DIAGNOSIS — I10 Essential (primary) hypertension: Secondary | ICD-10-CM

## 2021-04-13 DIAGNOSIS — J449 Chronic obstructive pulmonary disease, unspecified: Secondary | ICD-10-CM

## 2021-04-13 DIAGNOSIS — J9601 Acute respiratory failure with hypoxia: Secondary | ICD-10-CM | POA: Diagnosis not present

## 2021-04-13 DIAGNOSIS — I5033 Acute on chronic diastolic (congestive) heart failure: Secondary | ICD-10-CM

## 2021-04-13 DIAGNOSIS — E039 Hypothyroidism, unspecified: Secondary | ICD-10-CM

## 2021-04-13 LAB — VITAMIN B12: Vitamin B-12: 1904 pg/mL — ABNORMAL HIGH (ref 180–914)

## 2021-04-13 LAB — BASIC METABOLIC PANEL
Anion gap: 10 (ref 5–15)
BUN: 20 mg/dL (ref 8–23)
CO2: 24 mmol/L (ref 22–32)
Calcium: 8.9 mg/dL (ref 8.9–10.3)
Chloride: 102 mmol/L (ref 98–111)
Creatinine, Ser: 0.8 mg/dL (ref 0.44–1.00)
GFR, Estimated: >60 mL/min (ref >60)
Glucose, Bld: 130 mg/dL — ABNORMAL HIGH (ref 70–99)
Potassium: 3.2 mmol/L — ABNORMAL LOW (ref 3.5–5.1)
Sodium: 136 mmol/L (ref 135–145)

## 2021-04-13 LAB — CBC
HCT: 35 % — ABNORMAL LOW (ref 36.0–46.0)
Hemoglobin: 11.1 g/dL — ABNORMAL LOW (ref 12.0–15.0)
MCH: 31.4 pg (ref 26.0–34.0)
MCHC: 31.7 g/dL (ref 30.0–36.0)
MCV: 99.2 fL (ref 80.0–100.0)
Platelets: 306 10*3/uL (ref 150–400)
RBC: 3.53 MIL/uL — ABNORMAL LOW (ref 3.87–5.11)
RDW: 13.3 % (ref 11.5–15.5)
WBC: 11.8 10*3/uL — ABNORMAL HIGH (ref 4.0–10.5)
nRBC: 0 % (ref 0.0–0.2)

## 2021-04-13 LAB — RETICULOCYTES
Immature Retic Fract: 31.6 % — ABNORMAL HIGH (ref 2.3–15.9)
RBC.: 3.54 MIL/uL — ABNORMAL LOW (ref 3.87–5.11)
Retic Count, Absolute: 59.1 10*3/uL (ref 19.0–186.0)
Retic Ct Pct: 1.7 % (ref 0.4–3.1)

## 2021-04-13 LAB — FOLATE: Folate: 18.2 ng/mL (ref >5.9)

## 2021-04-13 LAB — URINE CULTURE: Culture: 80000 — AB

## 2021-04-13 LAB — IRON AND TIBC
Iron: 36 ug/dL (ref 28–170)
Saturation Ratios: 18 % (ref 10.4–31.8)
TIBC: 196 ug/dL — ABNORMAL LOW (ref 250–450)
UIBC: 160 ug/dL

## 2021-04-13 LAB — FERRITIN: Ferritin: 269 ng/mL (ref 11–307)

## 2021-04-13 LAB — T4, FREE: Free T4: 1.34 ng/dL — ABNORMAL HIGH (ref 0.61–1.12)

## 2021-04-13 MED ORDER — PREDNISONE 20 MG PO TABS
40.0000 mg | ORAL_TABLET | Freq: Every day | ORAL | Status: DC
  Administered 2021-04-13 – 2021-04-15 (×3): 40 mg via ORAL
  Filled 2021-04-13 (×3): qty 2

## 2021-04-13 MED ORDER — LEVOTHYROXINE SODIUM 75 MCG PO TABS
75.0000 ug | ORAL_TABLET | Freq: Every day | ORAL | Status: DC
  Administered 2021-04-13 – 2021-04-18 (×6): 75 ug via ORAL
  Filled 2021-04-13 (×6): qty 1

## 2021-04-13 MED ORDER — POTASSIUM CHLORIDE 20 MEQ PO PACK
40.0000 meq | PACK | Freq: Once | ORAL | Status: AC
  Administered 2021-04-13: 40 meq via ORAL
  Filled 2021-04-13: qty 2

## 2021-04-13 MED ORDER — ALBUTEROL SULFATE (2.5 MG/3ML) 0.083% IN NEBU
2.5000 mg | INHALATION_SOLUTION | Freq: Four times a day (QID) | RESPIRATORY_TRACT | Status: DC
  Administered 2021-04-13 – 2021-04-14 (×5): 2.5 mg via RESPIRATORY_TRACT
  Filled 2021-04-13 (×5): qty 3

## 2021-04-13 MED ORDER — FUROSEMIDE 10 MG/ML IJ SOLN
20.0000 mg | Freq: Once | INTRAMUSCULAR | Status: AC
Start: 2021-04-13 — End: 2021-04-13
  Administered 2021-04-13: 20 mg via INTRAVENOUS
  Filled 2021-04-13: qty 2

## 2021-04-13 NOTE — Evaluation (Signed)
Physical Therapy Evaluation Patient Details Name: Jody Blake MRN: [id number] DOB: Nov 19, 1925 Today's Date: 04/13/2021  History of Present Illness  88 y.o. female presenting to ED 1/4 with worsening SOB, productive cough and wheezing x2 days. COVID-19 PCR (-). Imaging (+) multifocal pneumonia with bilateral pleural effusion. Patient also found to have acute on chronic CHF exacerbation. PMHx significant for A-fib status post ablation and pacemaker placement, CHF, chronic bronchitis, HTN, thyroid disease, and GERD.   Clinical Impression  Pt presents with condition above and deficits mentioned below, see PT Problem List. PTA, patient was living alone in an [place] apartment at [facility] and was grossly Mod I with mobility using a RW/rollator. Patient has low vision and is HOH at baseline. Currently, pt displays deficits in lower extremity strength and coordination, along with deficits in activity tolerance and balance. She is at high risk for falls, displaying x2 LOB bouts needing minA to recover when ambulating with a RW today. Pt would benefit from a short-term rehab stay at a SNF to maximize her safety and independence with all functional mobility as she is currently functioning significantly below her baseline. Will continue to follow acutely.     Recommendations for follow up therapy are one component of a multi-disciplinary discharge planning process, led by the attending physician.  Recommendations may be updated based on patient status, additional functional criteria and insurance authorization.  Follow Up Recommendations Skilled nursing-short term rehab (<3 hours/day)    Assistance Recommended at Discharge Frequent or constant Supervision/Assistance  Patient can return home with the following  A little help with walking and/or transfers;A little help with bathing/dressing/bathroom;Assistance with cooking/housework;Help with stairs or ramp for entrance    Equipment Recommendations None  recommended by PT  Recommendations for Other Services       Functional Status Assessment Patient has had a recent decline in their functional status and demonstrates the ability to make significant improvements in function in a reasonable and predictable amount of time.     Precautions / Restrictions Precautions Precautions: Fall Precaution Comments: HOH; low vision Restrictions Weight Bearing Restrictions: No      Mobility  Bed Mobility               General bed mobility comments: Pt sitting up in recliner upon arrival.    Transfers Overall transfer level: Needs assistance Equipment used: Rolling walker (2 wheels) Transfers: Sit to/from Stand Sit to Stand: Min guard           General transfer comment: Cues provided to push up from recliner, min guard for safety with pt requiring extra time. Instability noted, but no LOB.    Ambulation/Gait Ambulation/Gait assistance: Min assist Gait Distance (Feet): 110 Feet Assistive device: Rolling walker (2 wheels) Gait Pattern/deviations: Step-through pattern;Decreased stride length;Trunk flexed;Narrow base of support Gait velocity: reduced Gait velocity interpretation: <1.31 ft/sec, indicative of household ambulator   General Gait Details: Pt with slow gait with narrow BOS, intermittently scissoring feet resulting in x2 LOB, needing minA to recover.  Stairs            Wheelchair Mobility    Modified Rankin (Stroke Patients Only)       Balance Overall balance assessment: Needs assistance Sitting-balance support: No upper extremity supported;Feet supported Sitting balance-Leahy Scale: Fair     Standing balance support: Bilateral upper extremity supported;During functional activity;Reliant on assistive device for balance Standing balance-Leahy Scale: Poor Standing balance comment: Reliant on RW  Pertinent Vitals/Pain Pain Assessment: Faces Faces Pain Scale: Hurts a  little bit Pain Location: generalized with mobility Pain Descriptors / Indicators: Grimacing Pain Intervention(s): Limited activity within patient's tolerance;Monitored during session;Repositioned    Home Living Family/patient expects to be discharged to:: Private residence Living Arrangements: Alone Available Help at Discharge: Family Type of Home: Independent living facility ([facility] (apartment)) Home Access: Level entry       Home Layout: One level Home Equipment: Conservation officer, nature (2 wheels);Rollator (4 wheels)      Prior Function Prior Level of Function : Independent/Modified Independent             Mobility Comments: RW in household and rollator in community dwellings. ADLs Comments: Mod I with ADLs/IADLs; prepares her own breakfast and lunch. Dinner in [employer]. Facility staff assist with med management but patient takes pills daily from pillbox. Hired help for light housekeeping and laundry.     Hand Dominance   Dominant Hand: Right    Extremity/Trunk Assessment   Upper Extremity Assessment Upper Extremity Assessment: Defer to OT evaluation    Lower Extremity Assessment Lower Extremity Assessment: Generalized weakness (MMT scores of 3+ bil hip flexion, 4+ bil knee extension; denies numbness/tingling bil)    Cervical / Trunk Assessment Cervical / Trunk Assessment: Kyphotic  Communication   Communication: HOH  Cognition Arousal/Alertness: Awake/alert Behavior During Therapy: WFL for tasks assessed/performed Overall Cognitive Status: Within Functional Limits for tasks assessed                                 General Comments: Daughter reports patient is "as sharp as a tack", per OT eval.        General Comments General comments (skin integrity, edema, etc.): SpO2 >/= 94% on RA throughout    Exercises     Assessment/Plan    PT Assessment Patient needs continued PT services  PT Problem List Decreased strength;Decreased  activity tolerance;Decreased range of motion;Decreased balance;Decreased mobility;Decreased coordination;Cardiopulmonary status limiting activity       PT Treatment Interventions DME instruction;Gait training;Functional mobility training;Therapeutic activities;Therapeutic exercise;Balance training;Neuromuscular re-education;Patient/family education    PT Goals (Current goals can be found in the Care Plan section)  Acute Rehab PT Goals Patient Stated Goal: to get better PT Goal Formulation: With patient Time For Goal Achievement: 04/27/21 Potential to Achieve Goals: Good    Frequency Min 2X/week     Co-evaluation               AM-PAC PT "6 Clicks" Mobility  Outcome Measure Help needed turning from your back to your side while in a flat bed without using bedrails?: A Little Help needed moving from lying on your back to sitting on the side of a flat bed without using bedrails?: A Little Help needed moving to and from a bed to a chair (including a wheelchair)?: A Little Help needed standing up from a chair using your arms (e.g., wheelchair or bedside chair)?: A Little Help needed to walk in hospital room?: A Little Help needed climbing 3-5 steps with a railing? : A Lot 6 Click Score: 17    End of Session Equipment Utilized During Treatment: Gait belt Activity Tolerance: Patient tolerated treatment well Patient left: in chair;with call bell/phone within reach;with chair alarm set Nurse Communication: Mobility status;Other (comment) (sats) PT Visit Diagnosis: Unsteadiness on feet (R26.81);Other abnormalities of gait and mobility (R26.89);Muscle weakness (generalized) (M62.81);Difficulty in walking, not elsewhere classified (R26.2)  Time: 1141-1200 PT Time Calculation (min) (ACUTE ONLY): 19 min   Charges:   PT Evaluation $PT Eval Moderate Complexity: 1 Mod          Moishe Spice, PT, DPT Acute Rehabilitation Services  Pager: [phone] Office:  [phone]   Orvan Falconer 04/13/2021, 12:45 PM

## 2021-04-13 NOTE — Progress Notes (Signed)
Heart Failure Nurse Navigator Progress Note  Assessed for HV TOC readiness. Pt from [facility], may need increased assistance upon DC as she has had decrease in functional status per OT note.   Pt declined HV TOC clinic appt upon DC from hospitalization. Pt educated on benefits of HV TOC.  EF: 60-65%  Pricilla Holm, MSN, RN Heart Failure Nurse Navigator [phone]

## 2021-04-13 NOTE — Progress Notes (Signed)
[facility] PROGRESS NOTE   Jody Blake EPP:[id number] DOB: [birth year] DOA: 04/11/2021  PCP: [name], MD  Brief History/Interval Summary: 88 y.o. female with history of A. fib status post ablation and pacemaker placement, congestive heart failure last EF measured was more than 75% in 2020, chronic bronchitis, hypothyroidism, GERD has been experiencing increasing shortness of breath with productive cough and wheezing for 2 days which has been gradually worsening.  Patient was tested for COVID which was negative at the facility.  Despite therapy at the facility patient was getting more short of breath and was referred to the ER.  Imaging studies in the emergency department revealed a multifocal pneumonia with bilateral pleural effusion.  BNP was noted to be elevated.  There was concern the patient had both pneumonia as well as CHF.  She was hospitalized for further management.  Reason for Visit: Community-acquired pneumonia.  Acute diastolic CHF  Consultants: None  Procedures: None    Subjective/Interval History: Patient is very hard of hearing.  Mentions that she may be feeling slightly better today.  Not coughing as much.  Continues to have some wheezing.  Denies any chest pain.  No nausea vomiting.    Assessment/Plan:  Community-acquired pneumonia/acute respiratory failure with hypoxia CT scan showed multifocal pneumonia.  COVID 19 PCR as well as influenza PCR's were negative.  Patient with productive cough for more than 2 days prior to admission.  Currently on ceftriaxone and azithromycin.  Was also started on vancomycin but MRSA PCR was negative.  Vancomycin was subsequently discontinued.   Respiratory status is stable.  Continues to require oxygen at 2 to 3 L/min.  Saturations in the mid 90s.  Does not appear to be on home oxygen.  This will need to be addressed closer to discharge. WBC is improving.  Remains afebrile.  Continue current treatment plan for now.  Acute  on chronic diastolic CHF/moderate aortic stenosis Echocardiogram shows normal systolic function.  Mild to moderate aortic stenosis was noted.  No worsening noted compared to previous echocardiogram.  Diastolic parameters were indeterminate. Give her additional dose of furosemide today.   Mildly elevated troponin levels noted which could be reflective of mild demand ischemia from respiratory issues.  Patient denies any chest pain.  Chronic bronchitis/acute wheezing Given nebulizer treatments and inhalers.  Chronically on steroids which is continued.  Noted to have less wheezing today.  We will give her higher dose of steroids.  She is chronically on prednisone at home.  History of atrial fibrillation status post ablation/pacemaker placement Stable.  Continue metoprolol.  Also on Xarelto.  Hypothyroidism On levothyroxine.  TSH noted to be 0.317 which could be reflective of sick euthyroid.  Free T4 noted to be elevated at 1.34.  We will cut back on the dose of her levothyroxine.   Essential hypertension Noted to have high blood pressures. Noted to be on metoprolol and losartan.  Continue to monitor for now.  Normocytic anemia Hemoglobin is stable.  Anemia panel unremarkable.  Hypokalemia Continue to replete.  Magnesium was 2.1 yesterday.  Positive urine culture Reveals 80,000 colonies of Enterobacter.  Significance unclear.  Not symptomatic.  Noted to be sensitive to ceftriaxone.   DVT Prophylaxis: Xarelto Code Status: DNR Family Communication: No family at bedside.  We will update her son today. Disposition Plan: Hopefully return back to her retirement facility when improved.  Will need PT and OT evaluation.  Status is: Inpatient  Remains inpatient appropriate because: Acute respiratory failure with hypoxia, pneumonia  Medications: Scheduled:  albuterol  2.5 mg Nebulization QID   famotidine  20 mg Oral QHS   gabapentin  100 mg Oral QHS   guaiFENesin  600 mg Oral BID    latanoprost  1 drop Both Eyes QHS   levothyroxine  75 mcg Oral QAC breakfast   loratadine  10 mg Oral Daily   losartan  100 mg Oral BID   metoprolol succinate  50 mg Oral Daily   mometasone-formoterol  2 puff Inhalation BID   montelukast  10 mg Oral Daily   predniSONE  5 mg Oral Q breakfast   Rivaroxaban  15 mg Oral Q supper   Continuous:  azithromycin 500 mg (04/13/21 1029)   cefTRIAXone (ROCEPHIN)  IV 2 g (04/13/21 0950)   DGL:OVFIEPPIRJJOA **OR** acetaminophen, albuterol, albuterol, guaiFENesin  Antibiotics: Anti-infectives (From admission, onward)    Start     Dose/Rate Route Frequency Ordered Stop   04/13/21 1000  vancomycin (VANCOREADY) IVPB 1250 mg/250 mL  Status:  Discontinued        1,250 mg 166.7 mL/hr over 90 Minutes Intravenous Every 48 hours 04/11/21 1053 04/12/21 1142   04/12/21 1000  cefTRIAXone (ROCEPHIN) 2 g in sodium chloride 0.9 % 100 mL IVPB        2 g 200 mL/hr over 30 Minutes Intravenous Every 24 hours 04/12/21 0415 04/17/21 0959   04/12/21 1000  azithromycin (ZITHROMAX) 500 mg in sodium chloride 0.9 % 250 mL IVPB        500 mg 250 mL/hr over 60 Minutes Intravenous Every 24 hours 04/12/21 0415 04/17/21 0959   04/11/21 1000  cefTRIAXone (ROCEPHIN) 1 g in sodium chloride 0.9 % 100 mL IVPB        1 g 200 mL/hr over 30 Minutes Intravenous  Once 04/11/21 0949 04/11/21 1336   04/11/21 1000  azithromycin (ZITHROMAX) 500 mg in sodium chloride 0.9 % 250 mL IVPB        500 mg 250 mL/hr over 60 Minutes Intravenous  Once 04/11/21 0949 04/11/21 1338   04/11/21 1000  vancomycin (VANCOCIN) IVPB 1000 mg/200 mL premix        1,000 mg 200 mL/hr over 60 Minutes Intravenous  Once 04/11/21 0950 04/11/21 1443   04/11/21 0945  levofloxacin (LEVAQUIN) IVPB 750 mg  Status:  Discontinued        750 mg 100 mL/hr over 90 Minutes Intravenous  Once 04/11/21 0937 04/11/21 0949       Objective:  Vital Signs  Vitals:   04/12/21 2348 04/13/21 0013 04/13/21 0300 04/13/21 1029   BP: (!) 168/77 (!) 178/76 138/70 (!) 160/70  Pulse: (!) 59  60   Resp: (!) 21  (!) 21   Temp: 98.4 F (36.9 C)  97.6 F (36.4 C)   TempSrc: Oral  Oral   SpO2: 98% 99% 95%   Weight:      Height:        Intake/Output Summary (Last 24 hours) at 04/13/2021 1110 Last data filed at 04/12/2021 1800 Gross per 24 hour  Intake 240 ml  Output 300 ml  Net -60 ml    Filed Weights   04/11/21 2106 04/12/21 0442  Weight: 58.6 kg 58.6 kg    General appearance: Awake alert.  In no distress.  Hard of hearing Resp: Less tachypneic today compared to yesterday.  Continues to have crackles bilaterally at the bases.  Less wheezing today compared to yesterday Cardio: S1-S2 is normal regular.  No S3-S4.  No rubs murmurs or  bruit GI: Abdomen is soft.  Nontender nondistended.  Bowel sounds are present normal.  No masses organomegaly Extremities: No edema.   Neurologic:  No focal neurological deficits.    Lab Results:  Data Reviewed: I have personally reviewed following labs and imaging studies  CBC: Recent Labs  Lab 04/11/21 0950 04/12/21 0426 04/13/21 0401  WBC 11.7* 13.4* 11.8*  NEUTROABS 9.7* 10.7*  --   HGB 11.7* 11.5* 11.1*  HCT 36.7 36.0 35.0*  MCV 98.4 98.4 99.2  PLT 271 308 306     Basic Metabolic Panel: Recent Labs  Lab 04/11/21 0950 04/12/21 0426 04/13/21 0401  NA 136 137 136  K 3.6 3.0* 3.2*  CL 99 99 102  CO2 26 26 24   GLUCOSE 148* 107* 130*  BUN 19 16 20   CREATININE 0.93 1.03* 0.80  CALCIUM 9.3 8.9 8.9  MG  --  2.1  --      GFR: Estimated Creatinine Clearance: 36.3 mL/min (by C-G formula based on SCr of 0.8 mg/dL).  Liver Function Tests: Recent Labs  Lab 04/11/21 0950  AST 30  ALT 37  ALKPHOS 99  BILITOT 1.0  PROT 6.8  ALBUMIN 3.6      Coagulation Profile: Recent Labs  Lab 04/11/21 0950  INR 1.8*      Thyroid Function Tests: Recent Labs    04/12/21 0426 04/13/21 0401  TSH 0.317*  --   FREET4  --  1.34*     Anemia Panel: Recent  Labs    04/13/21 0401  VITAMINB12 1,904*  FOLATE 18.2  FERRITIN 269  TIBC 196*  IRON 36  RETICCTPCT 1.7    Recent Results (from the past 240 hour(s))  SARS-COV-2 RNA,(COVID-19) QUAL NAAT     Status: None   Collection Time: 04/10/21  2:44 PM  Result Value Ref Range Status   SARS CoV2 RNA NOT DETECTED NOT DETECTED Final    Comment: . A Not Detected result means that SARS-CoV-2 RNA was not present in the specimen above the limit of detection. . A Not Detected result does not rule out the possibility of COVID-19 and should not be used as the sole basis for treatment or patient management decisions. If COVID-19 is still suspected, based on exposure history together with other clinical findings, re-testing should be  considered in the context of clinical observations and epidemiological data for patient management decisions. . Test Method: Nucleic Acid Amplification Test including reverse transcription polymerase chain reaction (RT-PCR) and transcription mediated amplification (TMA). The test method meets the [place] Centers for Disease Control and prevention (CDC) pre departure and arrival requirement for viral test for COVID-19 dated May 06, 2019. Testing requirements for traveling may change with time. The patient is responsible for determining the test requirements for each nation while they ar e traveling. . This test has been authorized by the FDA under an  Emergency Use Authorization (EUA) for use by authorized laboratories. . Please review the "Fact Sheets" and FDA authorized labeling available for health care providers and patients using the following websites: https://[email or web address]/fact-[email or web address]  [email or web address]/[email or web address]/ fact-sheet2 . Due to the current public health emergency, [facility] is accepting samples from appropriate clinical sources collected using wide variety of swabs and  transport media for COVID-19. Not detected test results derived from specimens received in non- commercially manufactured viral collection kits or those not yet authorized by FDA for COVID-19 testing should be cautiously evaluated and take extra precautions such as additional clinical monitoring, including collection of an additional specimen. [name]  Additional information about COVID-19 can be fo und at the [employer]: www.[email or web address]. . For patients with a Detected or Inconclusive test result, please see CDC's COVID-19 Treatments and  Medications page located at  BroadcastLocal.cz- severe-illness.html for information on COVID-19 therapeutics. . For patients with a Not Detected test result, please see CDC's Vaccines for COVID-19 page located at PhoneStatistics.is for information on COVID-19 vaccines.   Resp Panel by RT-PCR (Flu A&B, Covid) Nasopharyngeal Swab     Status: None   Collection Time: 04/11/21  9:13 AM   Specimen: Nasopharyngeal Swab; Nasopharyngeal(NP) swabs in vial transport medium  Result Value Ref Range Status   SARS Coronavirus 2 by RT PCR NEGATIVE NEGATIVE Final    Comment: (NOTE) SARS-CoV-2 target nucleic acids are NOT DETECTED.  The SARS-CoV-2 RNA is generally detectable in upper respiratory specimens during the acute phase of infection. The lowest concentration of SARS-CoV-2 viral copies this assay can detect is 138 copies/mL. A negative result does not preclude SARS-Cov-2 infection and should not be used as the sole basis for treatment or other patient management decisions. A negative result may occur with  improper specimen collection/handling, submission of specimen other than nasopharyngeal swab, presence of viral mutation(s) within the areas targeted by this assay, and inadequate number of viral copies(<138 copies/mL). A negative  result must be combined with clinical observations, patient history, and epidemiological information. The expected result is Negative.  Fact Sheet for Patients:  EntrepreneurPulse.com.au  Fact Sheet for Healthcare Providers:  IncredibleEmployment.be  This test is no t yet approved or cleared by the [place] FDA and  has been authorized for detection and/or diagnosis of SARS-CoV-2 by FDA under an Emergency Use Authorization (EUA). This EUA will remain  in effect (meaning this test can be used) for the duration of the COVID-19 declaration under Section 564(b)(1) of the Act, 21 U.S.C.section 360bbb-3(b)(1), unless the authorization is terminated  or revoked sooner.       Influenza A by PCR NEGATIVE NEGATIVE Final   Influenza B by PCR NEGATIVE NEGATIVE Final    Comment: (NOTE) The Xpert Xpress SARS-CoV-2/FLU/RSV plus assay is intended as an aid in the diagnosis of influenza from Nasopharyngeal swab specimens and should not be used as a sole basis for treatment. Nasal washings and aspirates are unacceptable for Xpert Xpress SARS-CoV-2/FLU/RSV testing.  Fact Sheet for Patients: EntrepreneurPulse.com.au  Fact Sheet for Healthcare Providers: IncredibleEmployment.be  This test is not yet approved or cleared by the [place] FDA and has been authorized for detection and/or diagnosis of SARS-CoV-2 by FDA under an Emergency Use Authorization (EUA). This EUA will remain in effect (meaning this test can be used) for the duration of the COVID-19 declaration under Section 564(b)(1) of the Act, 21 U.S.C. section 360bbb-3(b)(1), unless the authorization is terminated or revoked.  Performed at [facility], [address] [place], [place] [address]   Blood Culture (routine x 2)     Status: None (Preliminary result)   Collection Time: 04/11/21  9:50 AM   Specimen: Right Antecubital; Blood   Result Value Ref Range Status   Specimen Description   Final    RIGHT ANTECUBITAL Performed at [facility], [address] [place], [place] [address]    Special Requests   Final    BOTTLES DRAWN AEROBIC AND ANAEROBIC Blood Culture results may not be optimal due to an excessive volume of blood received in culture bottles Performed at [facility], [address] [place],  [address]  Culture   Final    NO GROWTH 2 DAYS Performed at [facility], [place] [address], [place], [place] [address]    Report Status PENDING  Incomplete  Urine Culture     Status: Abnormal   Collection Time: 04/11/21  9:50 AM   Specimen: In/Out Cath Urine  Result Value Ref Range Status   Specimen Description   Final    IN/OUT CATH URINE Performed at [facility], [address] [place], [place] [address]    Special Requests   Final    NONE Performed at [facility], [address] [place], [place] [address]    Culture 80,000 COLONIES/mL ENTEROBACTER AEROGENES (A)  Final   Report Status 04/13/2021 FINAL  Final   Organism ID, Bacteria ENTEROBACTER AEROGENES (A)  Final      Susceptibility   Enterobacter aerogenes - MIC*    CEFAZOLIN RESISTANT Resistant     CEFEPIME <=0.12 SENSITIVE Sensitive     CEFTRIAXONE <=0.25 SENSITIVE Sensitive     CIPROFLOXACIN <=0.25 SENSITIVE Sensitive     GENTAMICIN <=1 SENSITIVE Sensitive     IMIPENEM 0.5 SENSITIVE Sensitive     NITROFURANTOIN 64 INTERMEDIATE Intermediate     TRIMETH/SULFA <=20 SENSITIVE Sensitive     PIP/TAZO <=4 SENSITIVE Sensitive     * 80,000 COLONIES/mL ENTEROBACTER AEROGENES  Culture, blood (Routine X 2) w Reflex to ID Panel     Status: None (Preliminary result)   Collection Time: 04/11/21 10:10 AM   Specimen: BLOOD LEFT FOREARM  Result Value Ref Range Status   Specimen Description   Final    BLOOD LEFT FOREARM Performed at [facility],  [address] [place], [place] [address]    Special Requests   Final    BOTTLES DRAWN AEROBIC AND ANAEROBIC Blood Culture results may not be optimal due to an excessive volume of blood received in culture bottles Performed at [facility], [address], [place], [place] [address]    Culture   Final    NO GROWTH 2 DAYS Performed at [facility], [place] [address] [place], [place] [address]    Report Status PENDING  Incomplete  MRSA Next Gen by PCR, Nasal     Status: None   Collection Time: 04/12/21  4:14 AM   Specimen: Nasal Mucosa; Nasal Swab  Result Value Ref Range Status   MRSA by PCR Next Gen NOT DETECTED NOT DETECTED Final    Comment: (NOTE) The GeneXpert MRSA Assay (FDA approved for NASAL specimens only), is one component of a comprehensive MRSA colonization surveillance program. It is not intended to diagnose MRSA infection nor to guide or monitor treatment for MRSA infections. Test performance is not FDA approved in patients less than 22 years old. Performed at [facility], [place] [address], [place],  [address]        Radiology Studies: CT Angio Chest PE W and/or Wo Contrast  Result Date: 04/11/2021 CLINICAL DATA:  Shortness of breath. EXAM: CT ANGIOGRAPHY CHEST WITH CONTRAST TECHNIQUE: Multidetector CT imaging of the chest was performed using the standard protocol during bolus administration of intravenous contrast. Multiplanar CT image reconstructions and MIPs were obtained to evaluate the vascular anatomy. CONTRAST:  80mL OMNIPAQUE IOHEXOL 350 MG/ML SOLN COMPARISON:  October 20, 2006. FINDINGS: Cardiovascular: Satisfactory opacification of the pulmonary arteries to the segmental level. No evidence of pulmonary embolism. Mild cardiomegaly is noted. Coronary calcifications are noted. No pericardial effusion. Atherosclerosis of thoracic aorta is  noted without aneurysm formation. Mediastinum/Nodes: No enlarged mediastinal, hilar, or  axillary lymph nodes. Thyroid gland, trachea, and esophagus demonstrate no significant findings. Lungs/Pleura: No pneumothorax is noted. Small bilateral pleural effusions are noted with adjacent sub some atelectasis, left greater than right. Multiple patchy airspace opacities are noted consistent with multifocal pneumonia. Upper Abdomen: No acute abnormality. Musculoskeletal: No chest wall abnormality. No acute or significant osseous findings. Review of the MIP images confirms the above findings. IMPRESSION: No definite evidence of pulmonary embolus. Multiple patchy airspace opacities are noted bilaterally consistent with multifocal pneumonia. Small bilateral pleural effusions are noted with adjacent subsegmental atelectasis, left greater than right. Coronary calcifications are noted suggesting coronary disease. Aortic Atherosclerosis (ICD10-I70.0). Electronically Signed   By: Marijo Conception M.D.   On: 04/11/2021 12:54   ECHOCARDIOGRAM COMPLETE  Result Date: 04/12/2021    ECHOCARDIOGRAM REPORT   Patient Name:   Jody Blake Date of Exam: 04/12/2021 Medical Rec #:  [id number]      Height:       63.0 in Accession #:    [id number]     Weight:       129.2 lb Date of Birth:  Jun 05, 1925      BSA:          1.606 m Patient Age:    73 years       BP:           179/76 mmHg Patient Gender: F              HR:           60 bpm. Exam Location:  Inpatient Procedure: 2D Echo, 3D Echo, Cardiac Doppler and Color Doppler Indications:    I50.40* Unspecified combined systolic (congestive) and diastolic                 (congestive) heart failure  History:        Patient has prior history of Echocardiogram examinations, most                 recent 01/15/2019. Abnormal ECG and Pacemaker, COPD and TIA,                 Aortic Valve Disease, Arrythmias:Atrial Fibrillation,                 Signs/Symptoms:Syncope, Shortness of Breath and Dyspnea; Risk                 Factors:Hypertension. Aortic stenosis.  Sonographer:    Roseanna Rainbow RDCS  Referring Phys: [place]  Sonographer Comments: Technically difficult study due to poor echo windows. Very low parasternal window. Patient in high fowler's position. IMPRESSIONS  1. Left ventricular ejection fraction, by estimation, is 65 to 70%. The left ventricle has normal function. There is moderate left ventricular hypertrophy. Left ventricular diastolic parameters are indeterminate.  2. Right ventricular systolic function is low normal. The right ventricular size is mildly enlarged. There is severely elevated pulmonary artery systolic pressure.  3. Left atrial size was mildly dilated.  4. Right atrial size was severely dilated.  5. Mild mitral valve regurgitation.  6. Tricuspid valve regurgitation is severe.  7. AV is thickened, calcified with restricted motion Peak and mean gradients through the valve are 52 and 27 mm Hg respectively. Dimensionless index is 0.51. Overall consistent with mild to moderate MR. Compared to echo from October 2020, mean gradient is increased (17 to 27 mm Hg) . The aortic valve is abnormal. Aortic  valve regurgitation is mild.  8. The inferior vena cava is normal in size with <50% respiratory variability, suggesting right atrial pressure of 8 mmHg. FINDINGS  Left Ventricle: Left ventricular ejection fraction, by estimation, is 65 to 70%. The left ventricle has normal function. The left ventricular internal cavity size was normal in size. There is moderate left ventricular hypertrophy. Left ventricular diastolic parameters are indeterminate. Right Ventricle: The right ventricular size is mildly enlarged. Right vetricular wall thickness was not assessed. Right ventricular systolic function is low normal. There is severely elevated pulmonary artery systolic pressure. The tricuspid regurgitant velocity is 3.48 m/s, and with an assumed right atrial pressure of 15 mmHg, the estimated right ventricular systolic pressure is 44.8 mmHg. Left Atrium: Left atrial size was mildly  dilated. Right Atrium: Right atrial size was severely dilated. Pericardium: Trivial pericardial effusion is present. Mitral Valve: There is mild thickening of the mitral valve leaflet(s). Mild mitral annular calcification. Mild mitral valve regurgitation. MV peak gradient, 14.6 mmHg. The mean mitral valve gradient is 4.0 mmHg. Tricuspid Valve: The tricuspid valve is normal in structure. Tricuspid valve regurgitation is severe. Aortic Valve: AV is thickened, calcified with restricted motion Peak and mean gradients through the valve are 52 and 27 mm Hg respectively. Dimensionless index is 0.51. Overall consistent with mild to moderate MR. Compared to echo from October 2020, mean  gradient is increased (17 to 27 mm Hg). The aortic valve is abnormal. Aortic valve regurgitation is mild. Aortic valve mean gradient measures 27.2 mmHg. Aortic valve peak gradient measures 52.1 mmHg. Aortic valve area, by VTI measures 1.77 cm. Pulmonic Valve: The pulmonic valve was normal in structure. Pulmonic valve regurgitation is trivial. Aorta: The aortic root and ascending aorta are structurally normal, with no evidence of dilitation. Venous: The inferior vena cava is normal in size with less than 50% respiratory variability, suggesting right atrial pressure of 8 mmHg. IAS/Shunts: The interatrial septum was not assessed. Additional Comments: A device lead is visualized.  LEFT VENTRICLE PLAX 2D LVIDd:         3.80 cm LVIDs:         1.80 cm LV PW:         1.60 cm LV IVS:        1.30 cm LVOT diam:     2.10 cm     3D Volume EF: LV SV:         129         3D EF:        63 % LV SV Index:   80          LV EDV:       91 ml LVOT Area:     3.46 cm    LV ESV:       34 ml                            LV SV:        58 ml  LV Volumes (MOD) LV vol d, MOD A2C: 49.5 ml LV vol d, MOD A4C: 44.7 ml LV vol s, MOD A2C: 21.9 ml LV vol s, MOD A4C: 10.4 ml LV SV MOD A2C:     27.6 ml LV SV MOD A4C:     44.7 ml LV SV MOD BP:      31.5 ml RIGHT VENTRICLE              IVC RV S  prime:     12.40 cm/s  IVC diam: 1.70 cm TAPSE (M-mode): 2.1 cm LEFT ATRIUM             Index        RIGHT ATRIUM           Index LA diam:        4.90 cm 3.05 cm/m   RA Area:     31.60 cm LA Vol (A2C):   99.8 ml 62.15 ml/m  RA Volume:   106.00 ml 66.01 ml/m LA Vol (A4C):   58.9 ml 36.68 ml/m LA Biplane Vol: 79.1 ml 49.26 ml/m  AORTIC VALVE                     PULMONIC VALVE AV Area (Vmax):    2.04 cm      PR End Diast Vel: 1.79 msec AV Area (Vmean):   1.99 cm AV Area (VTI):     1.77 cm AV Vmax:           361.00 cm/s AV Vmean:          238.000 cm/s AV VTI:            0.731 m AV Peak Grad:      52.1 mmHg AV Mean Grad:      27.2 mmHg LVOT Vmax:         213.00 cm/s LVOT Vmean:        137.000 cm/s LVOT VTI:          0.373 m LVOT/AV VTI ratio: 0.51  AORTA Ao Root diam: 3.20 cm Ao Asc diam:  3.60 cm MITRAL VALVE                TRICUSPID VALVE MV Area (PHT): 3.40 cm     TR Peak grad:   48.4 mmHg MV Area VTI:   3.81 cm     TR Vmax:        348.00 cm/s MV Peak grad:  14.6 mmHg MV Mean grad:  4.0 mmHg     SHUNTS MV Vmax:       1.91 m/s     Systemic VTI:  0.37 m MV Vmean:      85.6 cm/s    Systemic Diam: 2.10 cm MV Decel Time: 223 msec MV E velocity: 132.00 cm/s Dorris Carnes MD Electronically signed by Dorris Carnes MD Signature Date/Time: 04/12/2021/11:55:13 AM    Final        LOS: 1 day   [facility] Pager on [email or web address]  04/13/2021, 11:10 AM

## 2021-04-13 NOTE — NC FL2 (Signed)
[place] LEVEL OF CARE SCREENING TOOL     IDENTIFICATION  Patient Name: Jody Blake Birthdate: 03/01/1926 Sex: female Admission Date (Current Location): 04/11/2021  [facility] and [place] Number:  Herbalist and Address:  [facility] [place], [place] [address] [place], [place] [address]      Provider Number: [id number]  Attending Physician Name and Address:  Bonnielee Haff, MD  Relative Name and Phone Number:  Lazarus Gowda, [phone]    Current Level of Care: Hospital Recommended Level of Care: [place] Prior Approval Number:    Date Approved/Denied:   PASRR Number: N/A  Discharge Plan: SNF    Current Diagnoses: Patient Active Problem List   Diagnosis Date Noted   Acute diastolic CHF (congestive heart failure) ([place]) 04/12/2021   Acute on chronic diastolic CHF (congestive heart failure) ([place]) 04/11/2021   Lobar pneumonia ([place]) 10/28/2019   CAP (community acquired pneumonia) 10/27/2019   Plantar fasciitis of right foot 07/07/2019   Acute embolism and thrombosis of deep vein of lower extremity ([place]) 03/18/2018   Osteoarthritis of right glenohumeral joint 08/22/2017   Macular pucker, right eye 07/08/2017   Primary open angle glaucoma of both eyes, mild stage 07/08/2017   TIA (transient ischemic attack) 06/24/2017   Hypercoagulable state due to atrial fibrillation ([place]) 03/05/2017   Ataxia 11/14/2016   DVT (deep vein thrombosis) in pregnancy 11/14/2016   CKD (chronic kidney disease), stage III ([place]) 11/14/2016   Chronic diastolic congestive heart failure ([place]) 11/14/2016   Hypertensive urgency 11/14/2016   Hypothyroidism 11/08/2015   Age-related macular degeneration, dry, both eyes 09/05/2015   Bilateral ocular hypertension 09/05/2015   Pseudophakia of both eyes 09/05/2015   Pleural effusion on left 06/17/2015   Complete heart block (HCC) 06/22/2014   Sick sinus syndrome ([place]) 04/29/2014    Tachycardia-bradycardia syndrome ([place]) 01/06/2013   Cardiac pacemaker in situ 09/07/2012   Long term (current) use of anticoagulants 09/07/2012   Orthostatic hypotension 09/07/2012   Syncope 09/06/2012   Choroidal nevus of left eye 07/26/2011   COPD mixed type ([place]) 04/26/2010   Seasonal and perennial allergic rhinitis 10/27/2008   Essential hypertension 04/19/2008   Permanent atrial fibrillation ([place]) 04/19/2008   GERD 10/29/2007   Bronchitis, chronic obstructive, with exacerbation ([place]) 04/06/2007   APPENDECTOMY, HX OF 04/06/2007    Orientation RESPIRATION BLADDER Height & Weight     Self, Time, Situation, Place  O2 (NC2) Continent, External catheter Weight: 129 lb 3 oz (58.6 kg) Height:  5\' 3"  (160 cm)  BEHAVIORAL SYMPTOMS/MOOD NEUROLOGICAL BOWEL NUTRITION STATUS      Continent Diet (See DC summary)  AMBULATORY STATUS COMMUNICATION OF NEEDS Skin   Limited Assist Verbally Skin abrasions (Ecchymosis bilateral legs)                       Personal Care Assistance Level of Assistance  Bathing, Feeding, Dressing Bathing Assistance: Limited assistance Feeding assistance: Independent Dressing Assistance: Limited assistance     Functional Limitations Info  Sight, Hearing, Speech Sight Info: Impaired Hearing Info: Impaired Speech Info: Adequate    SPECIAL CARE FACTORS FREQUENCY  PT (By licensed PT), OT (By licensed OT)     PT Frequency: 5x week OT Frequency: 5x week            Contractures Contractures Info: Not present    Additional Factors Info  Code Status, Allergies Code Status Info: DNR Allergies Info: Brimonidine Tartrate-timolol   Penicillins  Breo Ellipta (Fluticasone Furoate-vilanterol)   Vancomycin           Current Medications (04/13/2021):  This is the current hospital active medication list Current Facility-Administered Medications  Medication Dose Route Frequency Provider Last Rate Last Admin   acetaminophen (TYLENOL) tablet 650 mg  650 mg  Oral Q6H PRN Rise Patience, MD       Or   acetaminophen (TYLENOL) suppository 650 mg  650 mg Rectal Q6H PRN Rise Patience, MD       albuterol (PROVENTIL) (2.5 MG/3ML) 0.083% nebulizer solution 2.5 mg  2.5 mg Nebulization Q2H PRN Rise Patience, MD       albuterol (PROVENTIL) (2.5 MG/3ML) 0.083% nebulizer solution 2.5 mg  2.5 mg Nebulization Q2H PRN Rise Patience, MD   2.5 mg at 04/12/21 1801   albuterol (PROVENTIL) (2.5 MG/3ML) 0.083% nebulizer solution 2.5 mg  2.5 mg Nebulization QID Bonnielee Haff, MD   2.5 mg at 04/13/21 1548   azithromycin (ZITHROMAX) 500 mg in sodium chloride 0.9 % 250 mL IVPB  500 mg Intravenous Q24H Rise Patience, MD 250 mL/hr at 04/13/21 1029 500 mg at 04/13/21 1029   cefTRIAXone (ROCEPHIN) 2 g in sodium chloride 0.9 % 100 mL IVPB  2 g Intravenous Q24H Rise Patience, MD 200 mL/hr at 04/13/21 0950 2 g at 04/13/21 0950   famotidine (PEPCID) tablet 20 mg  20 mg Oral QHS Bonnielee Haff, MD   20 mg at 04/12/21 2236   gabapentin (NEURONTIN) capsule 100 mg  100 mg Oral QHS Rise Patience, MD   100 mg at 04/12/21 2236   guaiFENesin (MUCINEX) 12 hr tablet 600 mg  600 mg Oral BID Bonnielee Haff, MD   600 mg at 04/13/21 0952   guaiFENesin (ROBITUSSIN) 100 MG/5ML liquid 5 mL  5 mL Oral Q4H PRN Bonnielee Haff, MD   5 mL at 04/13/21 1225   latanoprost (XALATAN) 0.005 % ophthalmic solution 1 drop  1 drop Both Eyes QHS Rise Patience, MD   1 drop at 04/12/21 2245   levothyroxine (SYNTHROID) tablet 75 mcg  75 mcg Oral QAC breakfast Bonnielee Haff, MD   75 mcg at 04/13/21 0952   loratadine (CLARITIN) tablet 10 mg  10 mg Oral Daily Rise Patience, MD   10 mg at 04/13/21 0175   losartan (COZAAR) tablet 100 mg  100 mg Oral BID Rise Patience, MD   100 mg at 04/13/21 1025   metoprolol succinate (TOPROL-XL) 24 hr tablet 50 mg  50 mg Oral Daily Rise Patience, MD   50 mg at 04/13/21 8527   mometasone-formoterol (DULERA)  200-5 MCG/ACT inhaler 2 puff  2 puff Inhalation BID Rise Patience, MD   2 puff at 04/13/21 0853   montelukast (SINGULAIR) tablet 10 mg  10 mg Oral Daily Rise Patience, MD   10 mg at 04/13/21 7824   potassium chloride (KLOR-CON) packet 40 mEq  40 mEq Oral Once Bonnielee Haff, MD       predniSONE (DELTASONE) tablet 40 mg  40 mg Oral Q breakfast Bonnielee Haff, MD   40 mg at 04/13/21 1226   Rivaroxaban (XARELTO) tablet 15 mg  15 mg Oral Q supper Rise Patience, MD   15 mg at 04/12/21 1653     Discharge Medications: Please see discharge summary for a list of discharge medications.  Relevant Imaging Results:  Relevant Lab Results:   Additional Information SS# [id number]  Janett Billow  Tamala Julian, LCSWA

## 2021-04-13 NOTE — Progress Notes (Signed)
Mobility Specialist Progress Note:   04/13/21 1511  Mobility  Activity Transferred:  Chair to bed  Level of Assistance Minimal assist, patient does 75% or more  Assistive Device  (HHA)  Distance Ambulated (ft) 6 ft  Mobility Ambulated with assistance in room  Mobility Response Tolerated well  Mobility performed by Mobility specialist  $Mobility charge 1 Mobility   Pt received in chair wanting to go back to bed. No complaints of pain. Pt left in bewd with call bell in reach and all needs met.   [facility] Public librarian Phone [phone] Secondary Phone [phone]

## 2021-04-13 NOTE — TOC Initial Note (Signed)
Transition of Care [facility]) - Initial/Assessment Note    Patient Details  Name: Jody Blake MRN: [id number] Date of Birth: [birth year]  [facility]) CM/SW Contact:    Coralee Pesa, [place] Phone Number: 04/13/2021, 2:59 PM  Clinical Narrative:                 CSW confirmed with family that the plan is for pt to get rehab services at [facility] when ready to DC. CSW spoke with [facility], they are able to take pt when ready. If ready over the weekend contact supervisor at [phone], if next week, call Butch Penny in admissions. [facility] is private pay and do not need a PASRR at discharge. Pt will need an updated covid test prior to DC. CSW will complete FL2 and fax information to [place]. TOC will continue to follow.  Expected Discharge Plan: [place] Barriers to Discharge: SNF Pending transportation, Continued Medical Work up   Patient Goals and CMS Choice Patient states their goals for this hospitalization and ongoing recovery are:: Pt and family wanting to go to [place] and eventually return to [place]. CMS Medicare.gov Compare Post Acute Care list provided to:: Patient Represenative (must comment) (Cheryl,Daughter) Choice offered to / list presented to : Patient, Adult Children  Expected Discharge Plan and Services Expected Discharge Plan: [place] Acute Care Choice: [place] Living arrangements for the past 2 months: [place]                                      Prior Living Arrangements/Services Living arrangements for the past 2 months: [place] Lives with:: Facility Resident, Self Patient language and need for interpreter reviewed:: Yes Do you feel safe going back to the place where you live?: Yes      Need for Family Participation in Patient Care: Yes (Comment) Care giver support system in place?: Yes (comment)   Criminal Activity/Legal Involvement  Pertinent to Current Situation/Hospitalization: No - Comment as needed  Activities of Daily Living Home Assistive Devices/Equipment: Walker (specify type) ADL Screening (condition at time of admission) Patient's cognitive ability adequate to safely complete daily activities?: Yes Is the patient deaf or have difficulty hearing?: Yes Does the patient have difficulty seeing, even when wearing glasses/contacts?: No Does the patient have difficulty concentrating, remembering, or making decisions?: No Patient able to express need for assistance with ADLs?: Yes Does the patient have difficulty dressing or bathing?: No Independently performs ADLs?: Yes (appropriate for developmental age) Does the patient have difficulty walking or climbing stairs?: No Weakness of Legs: Both Weakness of Arms/Hands: Both  Permission Sought/Granted Permission sought to share information with : Family Supports Permission granted to share information with : Yes, Verbal Permission Granted  Share Information with NAME: Cheryll Hunt     Permission granted to share info w Relationship: Daughter  Permission granted to share info w Contact Information: [phone]  Emotional Assessment Appearance:: Appears stated age Attitude/Demeanor/Rapport: Engaged Affect (typically observed): Appropriate Orientation: : Oriented to Self, Oriented to Place, Oriented to Situation Alcohol / Substance Use: Not Applicable Psych Involvement: No (comment)  Admission diagnosis:  SOB (shortness of breath) [R06.02] Acute respiratory failure with hypoxia (HCC) [J96.01] Acute on chronic diastolic CHF (congestive heart failure) ([place]) [I50.33] Acute on chronic congestive heart failure, unspecified heart failure type ([place]) [I50.9] CAP (community acquired pneumonia) [J18.9] Acute  diastolic CHF (congestive heart failure) ([place]) [I50.31] Patient Active Problem List   Diagnosis Date Noted   Acute diastolic CHF (congestive heart failure) ([place])  04/12/2021   Acute on chronic diastolic CHF (congestive heart failure) ([place]) 04/11/2021   Lobar pneumonia ([place]) 10/28/2019   CAP (community acquired pneumonia) 10/27/2019   Plantar fasciitis of right foot 07/07/2019   Acute embolism and thrombosis of deep vein of lower extremity ([place]) 03/18/2018   Osteoarthritis of right glenohumeral joint 08/22/2017   Macular pucker, right eye 07/08/2017   Primary open angle glaucoma of both eyes, mild stage 07/08/2017   TIA (transient ischemic attack) 06/24/2017   Hypercoagulable state due to atrial fibrillation ([place]) 03/05/2017   Ataxia 11/14/2016   DVT (deep vein thrombosis) in pregnancy 11/14/2016   CKD (chronic kidney disease), stage III ([place]) 11/14/2016   Chronic diastolic congestive heart failure ([place]) 11/14/2016   Hypertensive urgency 11/14/2016   Hypothyroidism 11/08/2015   Age-related macular degeneration, dry, both eyes 09/05/2015   Bilateral ocular hypertension 09/05/2015   Pseudophakia of both eyes 09/05/2015   Pleural effusion on left 06/17/2015   Complete heart block (HCC) 06/22/2014   Sick sinus syndrome ([place]) 04/29/2014   Tachycardia-bradycardia syndrome ([place]) 01/06/2013   Cardiac pacemaker in situ 09/07/2012   Long term (current) use of anticoagulants 09/07/2012   Orthostatic hypotension 09/07/2012   Syncope 09/06/2012   Choroidal nevus of left eye 07/26/2011   COPD mixed type ([place]) 04/26/2010   Seasonal and perennial allergic rhinitis 10/27/2008   Essential hypertension 04/19/2008   Permanent atrial fibrillation ([place]) 04/19/2008   GERD 10/29/2007   Bronchitis, chronic obstructive, with exacerbation ([place]) 04/06/2007   APPENDECTOMY, HX OF 04/06/2007   PCP:  [name], MD Pharmacy:   [place], [place] [address] Phone: [phone] Fax: [phone]  [facility] ([place], [facility] [address] [place] [address] Phone: [phone] Fax: [phone]  [facility] [place] - [place], [place] - [address] AT [place] [address] [place] [address] Phone: [phone] Fax: [phone]  [id number] [place], [place] [address] Phone: [phone] Fax: [place], [place] - [place] [address] [place] [address] Phone: [phone] Fax: [phone]     Social Determinants of Health (SDOH) Interventions    Readmission Risk Interventions No flowsheet data found.

## 2021-04-14 ENCOUNTER — Inpatient Hospital Stay (HOSPITAL_COMMUNITY): Payer: Medicare Other

## 2021-04-14 DIAGNOSIS — R062 Wheezing: Secondary | ICD-10-CM | POA: Diagnosis not present

## 2021-04-14 DIAGNOSIS — J9601 Acute respiratory failure with hypoxia: Secondary | ICD-10-CM | POA: Diagnosis not present

## 2021-04-14 DIAGNOSIS — J189 Pneumonia, unspecified organism: Secondary | ICD-10-CM | POA: Diagnosis not present

## 2021-04-14 DIAGNOSIS — I5033 Acute on chronic diastolic (congestive) heart failure: Secondary | ICD-10-CM | POA: Diagnosis not present

## 2021-04-14 LAB — BASIC METABOLIC PANEL
Anion gap: 7 (ref 5–15)
BUN: 19 mg/dL (ref 8–23)
CO2: 26 mmol/L (ref 22–32)
Calcium: 9.5 mg/dL (ref 8.9–10.3)
Chloride: 105 mmol/L (ref 98–111)
Creatinine, Ser: 0.76 mg/dL (ref 0.44–1.00)
GFR, Estimated: >60 mL/min (ref >60)
Glucose, Bld: 154 mg/dL — ABNORMAL HIGH (ref 70–99)
Potassium: 3.9 mmol/L (ref 3.5–5.1)
Sodium: 138 mmol/L (ref 135–145)

## 2021-04-14 LAB — MAGNESIUM: Magnesium: 2.2 mg/dL (ref 1.7–2.4)

## 2021-04-14 MED ORDER — AMLODIPINE BESYLATE 5 MG PO TABS
5.0000 mg | ORAL_TABLET | Freq: Every day | ORAL | Status: DC
  Administered 2021-04-14 – 2021-04-18 (×5): 5 mg via ORAL
  Filled 2021-04-14 (×5): qty 1

## 2021-04-14 MED ORDER — ALBUTEROL SULFATE (2.5 MG/3ML) 0.083% IN NEBU
2.5000 mg | INHALATION_SOLUTION | Freq: Three times a day (TID) | RESPIRATORY_TRACT | Status: DC
  Administered 2021-04-14 – 2021-04-16 (×6): 2.5 mg via RESPIRATORY_TRACT
  Filled 2021-04-14 (×6): qty 3

## 2021-04-14 MED ORDER — HYDRALAZINE HCL 50 MG PO TABS
50.0000 mg | ORAL_TABLET | Freq: Three times a day (TID) | ORAL | Status: DC
  Administered 2021-04-14 – 2021-04-18 (×10): 50 mg via ORAL
  Filled 2021-04-14 (×10): qty 1

## 2021-04-14 MED ORDER — HYDRALAZINE HCL 20 MG/ML IJ SOLN
10.0000 mg | Freq: Four times a day (QID) | INTRAMUSCULAR | Status: DC | PRN
  Administered 2021-04-15 – 2021-04-16 (×2): 10 mg via INTRAVENOUS
  Filled 2021-04-14 (×3): qty 1

## 2021-04-14 MED ORDER — HYDRALAZINE HCL 25 MG PO TABS
25.0000 mg | ORAL_TABLET | Freq: Three times a day (TID) | ORAL | Status: DC | PRN
  Administered 2021-04-14 (×2): 25 mg via ORAL
  Filled 2021-04-14 (×2): qty 1

## 2021-04-14 MED ORDER — BUDESONIDE 0.5 MG/2ML IN SUSP
0.5000 mg | Freq: Two times a day (BID) | RESPIRATORY_TRACT | Status: DC
  Administered 2021-04-14 – 2021-04-18 (×9): 0.5 mg via RESPIRATORY_TRACT
  Filled 2021-04-14 (×10): qty 2

## 2021-04-14 NOTE — Progress Notes (Signed)
[facility] PROGRESS NOTE   Jody Blake RVI:[id number] DOB: 07-20-1925 DOA: 04/11/2021  PCP: [name], MD  Brief History/Interval Summary: 88 y.o. female with history of A. fib status post ablation and pacemaker placement, congestive heart failure last EF measured was more than 75% in 2020, chronic bronchitis, hypothyroidism, GERD has been experiencing increasing shortness of breath with productive cough and wheezing for 2 days which has been gradually worsening.  Patient was tested for COVID which was negative at the facility.  Despite therapy at the facility patient was getting more short of breath and was referred to the ER.  Imaging studies in the emergency department revealed a multifocal pneumonia with bilateral pleural effusion.  BNP was noted to be elevated.  There was concern the patient had both pneumonia as well as CHF.  She was hospitalized for further management.  Reason for Visit: Community-acquired pneumonia.  Acute diastolic CHF  Consultants: None  Procedures: None    Subjective/Interval History: Patient is extremely hard of hearing.  She feels that she is having worse day today compared to yesterday.  Coughing more.  Has been wheezing as well.  Denies any chest pain.  No nausea vomiting.     Assessment/Plan:  Community-acquired pneumonia/acute respiratory failure with hypoxia CT scan showed multifocal pneumonia.  COVID 19 PCR as well as influenza PCR's were negative.  Patient with productive cough for more than 2 days prior to admission.  Currently on ceftriaxone and azithromycin.  Was also started on vancomycin but MRSA PCR was negative.  Vancomycin was subsequently discontinued.   Patient feels worse this morning.  Chest x-ray was repeated which appears to be quite similar perhaps with less pulmonary edema compared to last film.  Pleural effusion is small.  WBC was getting better.  She remains afebrile. Continue current management for now. Continues to  require oxygen at 2 to 3 L/min.  Saturations are stable in the 90s. Continue Mucinex.  Flutter valve will be ordered.  Mobilize as tolerated.  Acute on chronic diastolic CHF/moderate aortic stenosis Echocardiogram shows normal systolic function.  Mild to moderate aortic stenosis was noted.  No worsening noted compared to previous echocardiogram.  Diastolic parameters were indeterminate. She was given a dose of furosemide yesterday.  Hold off on further doses for now.  Chest x-ray does show improvement in pulmonary edema. Mildly elevated troponin levels noted which could be reflective of mild demand ischemia from respiratory issues.  Patient denies any chest pain.  Chronic bronchitis/acute wheezing She is on prednisone chronically.  Currently receiving higher dose due to wheezing.  Chest x-ray as discussed above.  Continue nebulizer treatments.  We will give her nebulized budesonide as well.    History of atrial fibrillation status post ablation/pacemaker placement Stable.  Continue metoprolol.  Also on Xarelto.  Hypothyroidism On levothyroxine.  TSH noted to be 0.317 which could be reflective of sick euthyroid.  Free T4 noted to be elevated at 1.34.  Dose of levothyroxine was decreased.   Essential hypertension Blood pressure remains poorly controlled.  Noted to be on metoprolol and losartan.  Will add amlodipine.  Steroids could be contributing to elevated blood pressures.    Normocytic anemia Hemoglobin is stable.  Anemia panel unremarkable.  Hypokalemia Repleted.  Magnesium 2.2.  Positive urine culture Reveals 80,000 colonies of Enterobacter.  Significance unclear.  Not symptomatic.  Noted to be sensitive to ceftriaxone.   DVT Prophylaxis: Xarelto Code Status: DNR Family Communication: Son being updated every other day. Disposition Plan:  SNF recommended by physical therapy.  Status is: Inpatient  Remains inpatient appropriate because: Acute respiratory failure with hypoxia,  pneumonia       Medications: Scheduled:  albuterol  2.5 mg Nebulization TID   amLODipine  5 mg Oral Daily   famotidine  20 mg Oral QHS   gabapentin  100 mg Oral QHS   guaiFENesin  600 mg Oral BID   latanoprost  1 drop Both Eyes QHS   levothyroxine  75 mcg Oral QAC breakfast   loratadine  10 mg Oral Daily   losartan  100 mg Oral BID   metoprolol succinate  50 mg Oral Daily   mometasone-formoterol  2 puff Inhalation BID   montelukast  10 mg Oral Daily   predniSONE  40 mg Oral Q breakfast   Rivaroxaban  15 mg Oral Q supper   Continuous:  azithromycin 500 mg (04/14/21 1014)   cefTRIAXone (ROCEPHIN)  IV 2 g (04/14/21 1009)   WUJ:[id number] **OR** acetaminophen, albuterol, albuterol, guaiFENesin, hydrALAZINE  Antibiotics: Anti-infectives (From admission, onward)    Start     Dose/Rate Route Frequency Ordered Stop   04/13/21 1000  vancomycin (VANCOREADY) IVPB 1250 mg/250 mL  Status:  Discontinued        1,250 mg 166.7 mL/hr over 90 Minutes Intravenous Every 48 hours 04/11/21 1053 04/12/21 1142   04/12/21 1000  cefTRIAXone (ROCEPHIN) 2 g in sodium chloride 0.9 % 100 mL IVPB        2 g 200 mL/hr over 30 Minutes Intravenous Every 24 hours 04/12/21 0415 04/17/21 0959   04/12/21 1000  azithromycin (ZITHROMAX) 500 mg in sodium chloride 0.9 % 250 mL IVPB        500 mg 250 mL/hr over 60 Minutes Intravenous Every 24 hours 04/12/21 0415 04/17/21 0959   04/11/21 1000  cefTRIAXone (ROCEPHIN) 1 g in sodium chloride 0.9 % 100 mL IVPB        1 g 200 mL/hr over 30 Minutes Intravenous  Once 04/11/21 0949 04/11/21 1336   04/11/21 1000  azithromycin (ZITHROMAX) 500 mg in sodium chloride 0.9 % 250 mL IVPB        500 mg 250 mL/hr over 60 Minutes Intravenous  Once 04/11/21 0949 04/11/21 1338   04/11/21 1000  vancomycin (VANCOCIN) IVPB 1000 mg/200 mL premix        1,000 mg 200 mL/hr over 60 Minutes Intravenous  Once 04/11/21 0950 04/11/21 1443   04/11/21 0945  levofloxacin (LEVAQUIN) IVPB 750  mg  Status:  Discontinued        750 mg 100 mL/hr over 90 Minutes Intravenous  Once 04/11/21 0937 04/11/21 0949       Objective:  Vital Signs  Vitals:   04/14/21 0115 04/14/21 0400 04/14/21 0440 04/14/21 0801  BP: (!) 177/87 (!) 192/95 (!) 185/79 (!) 160/85  Pulse: 66 67 60 60  Resp: 19 19 (!) 22 16  Temp:  98.3 F (36.8 C)    TempSrc:  Oral    SpO2: 97% 97% 96% 97%  Weight:  57 kg    Height:        Intake/Output Summary (Last 24 hours) at 04/14/2021 1100 Last data filed at 04/14/2021 1014 Gross per 24 hour  Intake 1130 ml  Output 150 ml  Net 980 ml    Filed Weights   04/11/21 2106 04/12/21 0442 04/14/21 0400  Weight: 58.6 kg 58.6 kg 57 kg    General appearance: Awake alert.  In no distress Resp: Mildly tachypneic.  No use of accessory muscles.  End expiratory wheezing heard bilaterally.  Crackles heard at the bases.  No rhonchi. Cardio: S1-S2 is normal regular.  No S3-S4.  No rubs murmurs or bruit GI: Abdomen is soft.  Nontender nondistended.  Bowel sounds are present normal.  No masses organomegaly Extremities: No edema.   Neurologic: Alert and oriented x3.  No focal neurological deficits.     Lab Results:  Data Reviewed: I have personally reviewed following labs and imaging studies  CBC: Recent Labs  Lab 04/11/21 0950 04/12/21 0426 04/13/21 0401  WBC 11.7* 13.4* 11.8*  NEUTROABS 9.7* 10.7*  --   HGB 11.7* 11.5* 11.1*  HCT 36.7 36.0 35.0*  MCV 98.4 98.4 99.2  PLT 271 308 306     Basic Metabolic Panel: Recent Labs  Lab 04/11/21 0950 04/12/21 0426 04/13/21 0401 04/14/21 0357  NA 136 137 136 138  K 3.6 3.0* 3.2* 3.9  CL 99 99 102 105  CO2 26 26 24 26   GLUCOSE 148* 107* 130* 154*  BUN 19 16 20 19   CREATININE 0.93 1.03* 0.80 0.76  CALCIUM 9.3 8.9 8.9 9.5  MG  --  2.1  --  2.2     GFR: Estimated Creatinine Clearance: 36.3 mL/min (by C-G formula based on SCr of 0.76 mg/dL).  Liver Function Tests: Recent Labs  Lab 04/11/21 0950  AST 30   ALT 37  ALKPHOS 99  BILITOT 1.0  PROT 6.8  ALBUMIN 3.6      Coagulation Profile: Recent Labs  Lab 04/11/21 0950  INR 1.8*      Thyroid Function Tests: Recent Labs    04/12/21 0426 04/13/21 0401  TSH 0.317*  --   FREET4  --  1.34*     Anemia Panel: Recent Labs    04/13/21 0401  VITAMINB12 1,904*  FOLATE 18.2  FERRITIN 269  TIBC 196*  IRON 36  RETICCTPCT 1.7     Recent Results (from the past 240 hour(s))  SARS-COV-2 RNA,(COVID-19) QUAL NAAT     Status: None   Collection Time: 04/10/21  2:44 PM  Result Value Ref Range Status   SARS CoV2 RNA NOT DETECTED NOT DETECTED Final    Comment: . A Not Detected result means that SARS-CoV-2 RNA was not present in the specimen above the limit of detection. . A Not Detected result does not rule out the possibility of COVID-19 and should not be used as the sole basis for treatment or patient management decisions. If COVID-19 is still suspected, based on exposure history together with other clinical findings, re-testing should be  considered in the context of clinical observations and epidemiological data for patient management decisions. . Test Method: Nucleic Acid Amplification Test including reverse transcription polymerase chain reaction (RT-PCR) and transcription mediated amplification (TMA). The test method meets the [place] Centers for Disease Control and prevention (CDC) pre departure and arrival requirement for viral test for COVID-19 dated May 06, 2019. Testing requirements for traveling may change with time. The patient is responsible for determining the test requirements for each nation while they ar e traveling. . This test has been authorized by the FDA under an  Emergency Use Authorization (EUA) for use by authorized laboratories. . Please review the "Fact Sheets" and FDA authorized labeling available for health care providers and patients using the following  websites: https://www.[email or web address]/fact-[email or web address]  [email or web address]://[email or web address]/home/Covid-19/Patients/NAAT/ fact-sheet2 . Due to the current public health emergency, [facility] is accepting samples from appropriate clinical sources collected using wide variety of swabs  and transport media for COVID-19. Not detected test results derived from specimens received in non- commercially manufactured viral collection kits or those not yet authorized by FDA for COVID-19 testing should be cautiously evaluated and take extra precautions such as additional clinical monitoring, including collection of an additional specimen. . Additional information about COVID-19 can be fo und at the [employer] website: www.[email or web address].com/Covid19. . For patients with a Detected or Inconclusive test result, please see CDC's COVID-19 Treatments and  Medications page located at  BroadcastLocal.cz- severe-illness.html for information on COVID-19 therapeutics. . For patients with a Not Detected test result, please see CDC's Vaccines for COVID-19 page located at PhoneStatistics.is for information on COVID-19 vaccines.   Resp Panel by RT-PCR (Flu A&B, Covid) Nasopharyngeal Swab     Status: None   Collection Time: 04/11/21  9:13 AM   Specimen: Nasopharyngeal Swab; Nasopharyngeal(NP) swabs in vial transport medium  Result Value Ref Range Status   SARS Coronavirus 2 by RT PCR NEGATIVE NEGATIVE Final    Comment: (NOTE) SARS-CoV-2 target nucleic acids are NOT DETECTED.  The SARS-CoV-2 RNA is generally detectable in upper respiratory specimens during the acute phase of infection. The lowest concentration of SARS-CoV-2 viral copies this assay can detect is 138 copies/mL. A negative result does not preclude SARS-Cov-2 infection and should not be used as the sole basis for  treatment or other patient management decisions. A negative result may occur with  improper specimen collection/handling, submission of specimen other than nasopharyngeal swab, presence of viral mutation(s) within the areas targeted by this assay, and inadequate number of viral copies(<138 copies/mL). A negative result must be combined with clinical observations, patient history, and epidemiological information. The expected result is Negative.  Fact Sheet for Patients:  EntrepreneurPulse.com.au  Fact Sheet for Healthcare Providers:  IncredibleEmployment.be  This test is no t yet approved or cleared by the [place] FDA and  has been authorized for detection and/or diagnosis of SARS-CoV-2 by FDA under an Emergency Use Authorization (EUA). This EUA will remain  in effect (meaning this test can be used) for the duration of the COVID-19 declaration under Section 564(b)(1) of the Act, 21 U.S.C.section 360bbb-3(b)(1), unless the authorization is terminated  or revoked sooner.       Influenza A by PCR NEGATIVE NEGATIVE Final   Influenza B by PCR NEGATIVE NEGATIVE Final    Comment: (NOTE) The Xpert Xpress SARS-CoV-2/FLU/RSV plus assay is intended as an aid in the diagnosis of influenza from Nasopharyngeal swab specimens and should not be used as a sole basis for treatment. Nasal washings and aspirates are unacceptable for Xpert Xpress SARS-CoV-2/FLU/RSV testing.  Fact Sheet for Patients: EntrepreneurPulse.com.au  Fact Sheet for Healthcare Providers: IncredibleEmployment.be  This test is not yet approved or cleared by the [place] FDA and has been authorized for detection and/or diagnosis of SARS-CoV-2 by FDA under an Emergency Use Authorization (EUA). This EUA will remain in effect (meaning this test can be used) for the duration of the COVID-19 declaration under Section 564(b)(1) of the Act, 21  U.S.C. section 360bbb-3(b)(1), unless the authorization is terminated or revoked.  Performed at [facility], [address], [place], [place] [address]   Blood Culture (routine x 2)     Status: None (Preliminary result)   Collection Time: 04/11/21  9:50 AM   Specimen: Right Antecubital; Blood  Result Value Ref Range Status   Specimen Description   Final    RIGHT ANTECUBITAL Performed at [facility], [address], [place],  [place] [address]    Special Requests   Final    BOTTLES DRAWN AEROBIC AND ANAEROBIC Blood Culture results may not be optimal due to an excessive volume of blood received in culture bottles Performed at [facility], [address], [place], [place] [address]    Culture   Final    NO GROWTH 3 DAYS Performed at [facility], [place] [address], [place], [place] [address]    Report Status PENDING  Incomplete  Urine Culture     Status: Abnormal   Collection Time: 04/11/21  9:50 AM   Specimen: In/Out Cath Urine  Result Value Ref Range Status   Specimen Description   Final    IN/OUT CATH URINE Performed at [facility], [address] [place], [place] [address]    Special Requests   Final    NONE Performed at [facility], [address], [place], [place] [address]    Culture 80,000 COLONIES/mL ENTEROBACTER AEROGENES (A)  Final   Report Status 04/13/2021 FINAL  Final   Organism ID, Bacteria ENTEROBACTER AEROGENES (A)  Final      Susceptibility   Enterobacter aerogenes - MIC*    CEFAZOLIN RESISTANT Resistant     CEFEPIME <=0.12 SENSITIVE Sensitive     CEFTRIAXONE <=0.25 SENSITIVE Sensitive     CIPROFLOXACIN <=0.25 SENSITIVE Sensitive     GENTAMICIN <=1 SENSITIVE Sensitive     IMIPENEM 0.5 SENSITIVE Sensitive     NITROFURANTOIN 64 INTERMEDIATE Intermediate     TRIMETH/SULFA <=20 SENSITIVE Sensitive     PIP/TAZO <=4 SENSITIVE Sensitive     * 80,000  COLONIES/mL ENTEROBACTER AEROGENES  Culture, blood (Routine X 2) w Reflex to ID Panel     Status: None (Preliminary result)   Collection Time: 04/11/21 10:10 AM   Specimen: BLOOD LEFT FOREARM  Result Value Ref Range Status   Specimen Description   Final    BLOOD LEFT FOREARM Performed at [facility], [address], [place], [place] [address]    Special Requests   Final    BOTTLES DRAWN AEROBIC AND ANAEROBIC Blood Culture results may not be optimal due to an excessive volume of blood received in culture bottles Performed at [facility], [address] [place], [place] [address]    Culture   Final    NO GROWTH 3 DAYS Performed at [facility], [place] [address], [place], [place] [address]    Report Status PENDING  Incomplete  MRSA Next Gen by PCR, Nasal     Status: None   Collection Time: 04/12/21  4:14 AM   Specimen: Nasal Mucosa; Nasal Swab  Result Value Ref Range Status   MRSA by PCR Next Gen NOT DETECTED NOT DETECTED Final    Comment: (NOTE) The GeneXpert MRSA Assay (FDA approved for NASAL specimens only), is one component of a comprehensive MRSA colonization surveillance program. It is not intended to diagnose MRSA infection nor to guide or monitor treatment for MRSA infections. Test performance is not FDA approved in patients less than 33 years old. Performed at [facility], [place] [address] [place],  [address]        Radiology Studies: DG CHEST PORT 1 VIEW  Result Date: 04/14/2021 CLINICAL DATA:  Shortness of breath. EXAM: PORTABLE CHEST 1 VIEW COMPARISON:  04/11/2021. FINDINGS: Mildly increased conspicuity of patchy consolidation in the right lung and similar dense left basilar opacity, better characterized on recent CT chest. No visible pneumothorax. Similar small  left pleural effusion. Similar cardiomediastinal silhouette, mildly enlarged. Left subclavian approach cardiac rhythm maintenance device. Aortic  atherosclerosis. IMPRESSION: 1. Mildly increased conspicuity of patchy consolidation in the right lung and similar dense left basilar opacity, better characterized on recent CT chest. 2. Similar small left pleural effusion. Electronically Signed   By: Margaretha Sheffield M.D.   On: 04/14/2021 10:07       LOS: 2 days   [facility] Pager on [email or web address]  04/14/2021, 11:00 AM

## 2021-04-14 NOTE — Progress Notes (Signed)
BP is still high despite of losartan was given at bedtime. Dr Bridgett Larsson was notified and prn meds was ordered, given and will monitor.

## 2021-04-14 NOTE — Progress Notes (Signed)
Patient BP up to 199/100 PRN med given and MD notified. See epic for new order will continue to monitor the patient.

## 2021-04-15 DIAGNOSIS — I5033 Acute on chronic diastolic (congestive) heart failure: Secondary | ICD-10-CM | POA: Diagnosis not present

## 2021-04-15 DIAGNOSIS — J189 Pneumonia, unspecified organism: Secondary | ICD-10-CM | POA: Diagnosis not present

## 2021-04-15 DIAGNOSIS — R062 Wheezing: Secondary | ICD-10-CM | POA: Diagnosis not present

## 2021-04-15 DIAGNOSIS — J9601 Acute respiratory failure with hypoxia: Secondary | ICD-10-CM | POA: Diagnosis not present

## 2021-04-15 NOTE — Progress Notes (Signed)
Mobility Specialist Progress Note:   04/15/21 1204  Mobility  Activity Transferred:  Bed to chair  Level of Assistance Minimal assist, patient does 75% or more  Assistive Device  (HHA)  Distance Ambulated (ft) 4 ft  Mobility Out of bed to chair with meals  Mobility Response Tolerated well  Mobility performed by Mobility specialist  Bed Position Chair  $Mobility charge 1 Mobility   No complaints of pain. MinA to stand and walk to chair. Left in chair with call bell in reach and all needs met.   [facility] Public librarian Phone [phone] Secondary Phone [phone]

## 2021-04-15 NOTE — Progress Notes (Signed)
[facility] PROGRESS NOTE   Jody Blake JOI:[id number] DOB: 01/28/1926 DOA: 04/11/2021  PCP: [name], MD  Brief History/Interval Summary: 88 y.o. female with history of A. fib status post ablation and pacemaker placement, congestive heart failure last EF measured was more than 75% in 2020, chronic bronchitis, hypothyroidism, GERD has been experiencing increasing shortness of breath with productive cough and wheezing for 2 days which has been gradually worsening.  Patient was tested for COVID which was negative at the facility.  Despite therapy at the facility patient was getting more short of breath and was referred to the ER.  Imaging studies in the emergency department revealed a multifocal pneumonia with bilateral pleural effusion.  BNP was noted to be elevated.  There was concern the patient had both pneumonia as well as CHF.  She was hospitalized for further management.  Reason for Visit: Community-acquired pneumonia.  Acute diastolic CHF  Consultants: None  Procedures: None    Subjective/Interval History: Patient is extremely hard of hearing.  She mentions that she is feeling slightly better in terms of her breathing today.  Remains anxious about her high blood pressure issues yesterday.  No nausea vomiting.      Assessment/Plan:  Community-acquired pneumonia/acute respiratory failure with hypoxia CT scan showed multifocal pneumonia.  COVID 19 PCR as well as influenza PCR's were negative.  Patient with productive cough for more than 2 days prior to admission.  She was started on ceftriaxone and azithromycin.  Was also started on vancomycin but MRSA PCR was negative.  Vancomycin was subsequently discontinued.   Patient also had significant wheezing.  Dose of prednisone had to be increased.  Budesonide was also initiated. Chest x-ray was repeated which showed stable findings.  Small pleural effusion was noted. Respiratory status seems to be slowly getting better.  She  has been weaned off of oxygen.  Remains afebrile. WBC had improved.  Wheezing appears to be less today.  Continue Mucinex flutter valve. Start mobilizing.  Out of bed to chair.  Acute on chronic diastolic CHF/moderate aortic stenosis Echocardiogram shows normal systolic function.  Mild to moderate aortic stenosis was noted.  No worsening noted compared to previous echocardiogram.  Diastolic parameters were indeterminate. Has received 2 doses of furosemide during this hospital stay.  Hold off on further doses since volume status seems to be stable. Chest x-ray does show improvement in pulmonary edema. Mildly elevated troponin levels noted which could be reflective of mild demand ischemia from respiratory issues.  Patient denies any chest pain.  Do not anticipate any ischemic work-up in the hospital.  Chronic bronchitis/acute wheezing She is on prednisone chronically.  Currently receiving higher dose due to wheezing.  Chest x-ray as discussed above.  Wheezing appears to be improving.  Continue current management for another 24 hours.  History of atrial fibrillation status post ablation/pacemaker placement Stable.  Continue metoprolol.  Also on Xarelto.  Hypothyroidism On levothyroxine.  TSH noted to be 0.317 which could be reflective of sick euthyroid.  Free T4 noted to be elevated at 1.34.  Dose of levothyroxine was decreased.   Essential hypertension/accelerated hypertension Blood pressure was poorly controlled yesterday.  Appears to be secondary to steroids.  She was given multiple doses of hydralazine with improvement.  Started on hydralazine last night.  Blood pressure seems to be better this morning. Also on her home dose of metoprolol and losartan.  Amlodipine was also added. Monitor closely.  May need to take her off of some of these  medications as her steroid is tapered down to   Normocytic anemia Hemoglobin is stable.  Anemia panel unremarkable.  Hypokalemia Repleted.  Magnesium  2.2.  Positive urine culture Reveals 80,000 colonies of Enterobacter.  Significance unclear.  Not symptomatic.  Noted to be sensitive to ceftriaxone.   DVT Prophylaxis: Xarelto Code Status: DNR Family Communication: Son being updated every other day. Disposition Plan: SNF recommended by physical therapy.  Status is: Inpatient  Remains inpatient appropriate because: Acute respiratory failure with hypoxia, pneumonia       Medications: Scheduled:  albuterol  2.5 mg Nebulization TID   amLODipine  5 mg Oral Daily   budesonide (PULMICORT) nebulizer solution  0.5 mg Nebulization BID   famotidine  20 mg Oral QHS   gabapentin  100 mg Oral QHS   guaiFENesin  600 mg Oral BID   hydrALAZINE  50 mg Oral Q8H   latanoprost  1 drop Both Eyes QHS   levothyroxine  75 mcg Oral QAC breakfast   loratadine  10 mg Oral Daily   losartan  100 mg Oral BID   metoprolol succinate  50 mg Oral Daily   montelukast  10 mg Oral Daily   predniSONE  40 mg Oral Q breakfast   Rivaroxaban  15 mg Oral Q supper   Continuous:  azithromycin 500 mg (04/15/21 1045)   cefTRIAXone (ROCEPHIN)  IV 2 g (04/15/21 0839)   IWP:[id number] **OR** acetaminophen, albuterol, albuterol, guaiFENesin, hydrALAZINE  Antibiotics: Anti-infectives (From admission, onward)    Start     Dose/Rate Route Frequency Ordered Stop   04/13/21 1000  vancomycin (VANCOREADY) IVPB 1250 mg/250 mL  Status:  Discontinued        1,250 mg 166.7 mL/hr over 90 Minutes Intravenous Every 48 hours 04/11/21 1053 04/12/21 1142   04/12/21 1000  cefTRIAXone (ROCEPHIN) 2 g in sodium chloride 0.9 % 100 mL IVPB        2 g 200 mL/hr over 30 Minutes Intravenous Every 24 hours 04/12/21 0415 04/17/21 0959   04/12/21 1000  azithromycin (ZITHROMAX) 500 mg in sodium chloride 0.9 % 250 mL IVPB        500 mg 250 mL/hr over 60 Minutes Intravenous Every 24 hours 04/12/21 0415 04/17/21 0959   04/11/21 1000  cefTRIAXone (ROCEPHIN) 1 g in sodium chloride 0.9 % 100  mL IVPB        1 g 200 mL/hr over 30 Minutes Intravenous  Once 04/11/21 0949 04/11/21 1336   04/11/21 1000  azithromycin (ZITHROMAX) 500 mg in sodium chloride 0.9 % 250 mL IVPB        500 mg 250 mL/hr over 60 Minutes Intravenous  Once 04/11/21 0949 04/11/21 1338   04/11/21 1000  vancomycin (VANCOCIN) IVPB 1000 mg/200 mL premix        1,000 mg 200 mL/hr over 60 Minutes Intravenous  Once 04/11/21 0950 04/11/21 1443   04/11/21 0945  levofloxacin (LEVAQUIN) IVPB 750 mg  Status:  Discontinued        750 mg 100 mL/hr over 90 Minutes Intravenous  Once 04/11/21 0937 04/11/21 0949       Objective:  Vital Signs  Vitals:   04/15/21 0400 04/15/21 0747 04/15/21 0800 04/15/21 0832  BP: (!) 159/82  (!) 150/64   Pulse: 68 68 61 69  Resp: 20 20 20    Temp:   98.6 F (37 C)   TempSrc:   Oral   SpO2: 97% 98% 95%   Weight:      Height:  Intake/Output Summary (Last 24 hours) at 04/15/2021 1103 Last data filed at 04/15/2021 0343 Gross per 24 hour  Intake --  Output 400 ml  Net -400 ml    Filed Weights   04/12/21 0442 04/14/21 0400 04/15/21 0343  Weight: 58.6 kg 57 kg 56.5 kg    General appearance: Awake alert.  In no distress Resp: Improved effort.  Less wheezing today compared to yesterday.  Few crackles at the bases bilaterally.  No rhonchi. Cardio: S1-S2 is normal regular.  No S3-S4.  No rubs murmurs or bruit GI: Abdomen is soft.  Nontender nondistended.  Bowel sounds are present normal.  No masses organomegaly Extremities: No edema.   Neurologic:  No focal neurological deficits.      Lab Results:  Data Reviewed: I have personally reviewed following labs and imaging studies  CBC: Recent Labs  Lab 04/11/21 0950 04/12/21 0426 04/13/21 0401  WBC 11.7* 13.4* 11.8*  NEUTROABS 9.7* 10.7*  --   HGB 11.7* 11.5* 11.1*  HCT 36.7 36.0 35.0*  MCV 98.4 98.4 99.2  PLT 271 308 306     Basic Metabolic Panel: Recent Labs  Lab 04/11/21 0950 04/12/21 0426 04/13/21 0401  04/14/21 0357  NA 136 137 136 138  K 3.6 3.0* 3.2* 3.9  CL 99 99 102 105  CO2 26 26 24 26   GLUCOSE 148* 107* 130* 154*  BUN 19 16 20 19   CREATININE 0.93 1.03* 0.80 0.76  CALCIUM 9.3 8.9 8.9 9.5  MG  --  2.1  --  2.2     GFR: Estimated Creatinine Clearance: 36.3 mL/min (by C-G formula based on SCr of 0.76 mg/dL).  Liver Function Tests: Recent Labs  Lab 04/11/21 0950  AST 30  ALT 37  ALKPHOS 99  BILITOT 1.0  PROT 6.8  ALBUMIN 3.6      Coagulation Profile: Recent Labs  Lab 04/11/21 0950  INR 1.8*      Thyroid Function Tests: Recent Labs    04/13/21 0401  FREET4 1.34*     Anemia Panel: Recent Labs    04/13/21 0401  VITAMINB12 1,904*  FOLATE 18.2  FERRITIN 269  TIBC 196*  IRON 36  RETICCTPCT 1.7     Recent Results (from the past 240 hour(s))  SARS-COV-2 RNA,(COVID-19) QUAL NAAT     Status: None   Collection Time: 04/10/21  2:44 PM  Result Value Ref Range Status   SARS CoV2 RNA NOT DETECTED NOT DETECTED Final    Comment: . A Not Detected result means that SARS-CoV-2 RNA was not present in the specimen above the limit of detection. . A Not Detected result does not rule out the possibility of COVID-19 and should not be used as the sole basis for treatment or patient management decisions. If COVID-19 is still suspected, based on exposure history together with other clinical findings, re-testing should be  considered in the context of clinical observations and epidemiological data for patient management decisions. . Test Method: Nucleic Acid Amplification Test including reverse transcription polymerase chain reaction (RT-PCR) and transcription mediated amplification (TMA). The test method meets the [place] Centers for Disease Control and prevention (CDC) pre departure and arrival requirement for viral test for COVID-19 dated May 06, 2019. Testing requirements for traveling may change with time. The patient is responsible for determining the  test requirements for each nation while they ar e traveling. . This test has been authorized by the FDA under an  Emergency Use Authorization (EUA) for use by authorized laboratories. [name]  Please review the "Fact Sheets" and FDA authorized labeling available for health care providers and patients using the following websites: https://www.[facility].com/home/Covid-19/HCP/NAAT/fact-sheet2  [email or web address]://www.[email or web address]/home/Covid-19/Patients/NAAT/ fact-sheet2 . Due to the current public health emergency, [facility] is accepting samples from appropriate clinical sources collected using wide variety of swabs and transport media for COVID-19. Not detected test results derived from specimens received in non- commercially manufactured viral collection kits or those not yet authorized by FDA for COVID-19 testing should be cautiously evaluated and take extra precautions such as additional clinical monitoring, including collection of an additional specimen. . Additional information about COVID-19 can be fo und at the [employer] website: www.[email or web address].com/Covid19. . For patients with a Detected or Inconclusive test result, please see CDC's COVID-19 Treatments and  Medications page located at  BroadcastLocal.cz- severe-illness.html for information on COVID-19 therapeutics. . For patients with a Not Detected test result, please see CDC's Vaccines for COVID-19 page located at PhoneStatistics.is for information on COVID-19 vaccines.   Resp Panel by RT-PCR (Flu A&B, Covid) Nasopharyngeal Swab     Status: None   Collection Time: 04/11/21  9:13 AM   Specimen: Nasopharyngeal Swab; Nasopharyngeal(NP) swabs in vial transport medium  Result Value Ref Range Status   SARS Coronavirus 2 by RT PCR NEGATIVE NEGATIVE Final    Comment: (NOTE) SARS-CoV-2 target nucleic acids are  NOT DETECTED.  The SARS-CoV-2 RNA is generally detectable in upper respiratory specimens during the acute phase of infection. The lowest concentration of SARS-CoV-2 viral copies this assay can detect is 138 copies/mL. A negative result does not preclude SARS-Cov-2 infection and should not be used as the sole basis for treatment or other patient management decisions. A negative result may occur with  improper specimen collection/handling, submission of specimen other than nasopharyngeal swab, presence of viral mutation(s) within the areas targeted by this assay, and inadequate number of viral copies(<138 copies/mL). A negative result must be combined with clinical observations, patient history, and epidemiological information. The expected result is Negative.  Fact Sheet for Patients:  EntrepreneurPulse.com.au  Fact Sheet for Healthcare Providers:  IncredibleEmployment.be  This test is no t yet approved or cleared by the [place] FDA and  has been authorized for detection and/or diagnosis of SARS-CoV-2 by FDA under an Emergency Use Authorization (EUA). This EUA will remain  in effect (meaning this test can be used) for the duration of the COVID-19 declaration under Section 564(b)(1) of the Act, 21 U.S.C.section 360bbb-3(b)(1), unless the authorization is terminated  or revoked sooner.       Influenza A by PCR NEGATIVE NEGATIVE Final   Influenza B by PCR NEGATIVE NEGATIVE Final    Comment: (NOTE) The Xpert Xpress SARS-CoV-2/FLU/RSV plus assay is intended as an aid in the diagnosis of influenza from Nasopharyngeal swab specimens and should not be used as a sole basis for treatment. Nasal washings and aspirates are unacceptable for Xpert Xpress SARS-CoV-2/FLU/RSV testing.  Fact Sheet for Patients: EntrepreneurPulse.com.au  Fact Sheet for Healthcare Providers: IncredibleEmployment.be  This test is not  yet approved or cleared by the [place] FDA and has been authorized for detection and/or diagnosis of SARS-CoV-2 by FDA under an Emergency Use Authorization (EUA). This EUA will remain in effect (meaning this test can be used) for the duration of the COVID-19 declaration under Section 564(b)(1) of the Act, 21 U.S.C. section 360bbb-3(b)(1), unless the authorization is terminated or revoked.  Performed at [facility], [address] [place], [place] [address]   Blood Culture (routine x 2)  Status: None (Preliminary result)   Collection Time: 04/11/21  9:50 AM   Specimen: Right Antecubital; Blood  Result Value Ref Range Status   Specimen Description   Final    RIGHT ANTECUBITAL Performed at [facility], [address], [place], [place] [address]    Special Requests   Final    BOTTLES DRAWN AEROBIC AND ANAEROBIC Blood Culture results may not be optimal due to an excessive volume of blood received in culture bottles Performed at [facility], [address] [place], [place] [address]    Culture   Final    NO GROWTH 4 DAYS Performed at [facility], [place] [address], [place], [place] [address]    Report Status PENDING  Incomplete  Urine Culture     Status: Abnormal   Collection Time: 04/11/21  9:50 AM   Specimen: In/Out Cath Urine  Result Value Ref Range Status   Specimen Description   Final    IN/OUT CATH URINE Performed at [facility], [address] [place], [place] [address]    Special Requests   Final    NONE Performed at [facility], [address], [place], [place] [address]    Culture 80,000 COLONIES/mL ENTEROBACTER AEROGENES (A)  Final   Report Status 04/13/2021 FINAL  Final   Organism ID, Bacteria ENTEROBACTER AEROGENES (A)  Final      Susceptibility   Enterobacter aerogenes - MIC*    CEFAZOLIN RESISTANT Resistant     CEFEPIME <=0.12 SENSITIVE  Sensitive     CEFTRIAXONE <=0.25 SENSITIVE Sensitive     CIPROFLOXACIN <=0.25 SENSITIVE Sensitive     GENTAMICIN <=1 SENSITIVE Sensitive     IMIPENEM 0.5 SENSITIVE Sensitive     NITROFURANTOIN 64 INTERMEDIATE Intermediate     TRIMETH/SULFA <=20 SENSITIVE Sensitive     PIP/TAZO <=4 SENSITIVE Sensitive     * 80,000 COLONIES/mL ENTEROBACTER AEROGENES  Culture, blood (Routine X 2) w Reflex to ID Panel     Status: None (Preliminary result)   Collection Time: 04/11/21 10:10 AM   Specimen: BLOOD LEFT FOREARM  Result Value Ref Range Status   Specimen Description   Final    BLOOD LEFT FOREARM Performed at [facility], [address], [place], [place] [address]    Special Requests   Final    BOTTLES DRAWN AEROBIC AND ANAEROBIC Blood Culture results may not be optimal due to an excessive volume of blood received in culture bottles Performed at [facility], [address] [place], [place] [address]    Culture   Final    NO GROWTH 4 DAYS Performed at [facility], [place] [address] [place], [place] [address]    Report Status PENDING  Incomplete  MRSA Next Gen by PCR, Nasal     Status: None   Collection Time: 04/12/21  4:14 AM   Specimen: Nasal Mucosa; Nasal Swab  Result Value Ref Range Status   MRSA by PCR Next Gen NOT DETECTED NOT DETECTED Final    Comment: (NOTE) The GeneXpert MRSA Assay (FDA approved for NASAL specimens only), is one component of a comprehensive MRSA colonization surveillance program. It is not intended to diagnose MRSA infection nor to guide or monitor treatment for MRSA infections. Test performance is not FDA approved in patients less than 34 years old. Performed at [facility], [place] [address], [place],  [address]        Radiology Studies: DG CHEST PORT 1 VIEW  Result Date: 04/14/2021 CLINICAL DATA:  Shortness of breath. EXAM: PORTABLE CHEST 1 VIEW COMPARISON:  04/11/2021. FINDINGS: Mildly increased  conspicuity of patchy consolidation in the right lung and similar dense left basilar opacity, better characterized on recent CT chest. No visible pneumothorax. Similar small left pleural effusion. Similar cardiomediastinal silhouette, mildly enlarged. Left subclavian approach cardiac rhythm maintenance device. Aortic atherosclerosis. IMPRESSION: 1. Mildly increased conspicuity of patchy consolidation in the right lung and similar dense left basilar opacity, better characterized on recent CT chest. 2. Similar small left pleural effusion. Electronically Signed   By: Margaretha Sheffield M.D.   On: 04/14/2021 10:07       LOS: 3 days   [facility] Pager on [email or web address]  04/15/2021, 11:03 AM

## 2021-04-16 DIAGNOSIS — J189 Pneumonia, unspecified organism: Secondary | ICD-10-CM | POA: Diagnosis not present

## 2021-04-16 DIAGNOSIS — R062 Wheezing: Secondary | ICD-10-CM | POA: Diagnosis not present

## 2021-04-16 DIAGNOSIS — J9601 Acute respiratory failure with hypoxia: Secondary | ICD-10-CM | POA: Diagnosis not present

## 2021-04-16 LAB — CULTURE, BLOOD (ROUTINE X 2)
Culture: NO GROWTH
Culture: NO GROWTH

## 2021-04-16 MED ORDER — CEFDINIR 300 MG PO CAPS
300.0000 mg | ORAL_CAPSULE | Freq: Two times a day (BID) | ORAL | Status: DC
  Administered 2021-04-17 – 2021-04-18 (×3): 300 mg via ORAL
  Filled 2021-04-16 (×3): qty 1

## 2021-04-16 MED ORDER — PREDNISONE 20 MG PO TABS
20.0000 mg | ORAL_TABLET | Freq: Every day | ORAL | Status: DC
  Administered 2021-04-17 – 2021-04-18 (×2): 20 mg via ORAL
  Filled 2021-04-16 (×2): qty 1

## 2021-04-16 MED ORDER — PREDNISONE 20 MG PO TABS
20.0000 mg | ORAL_TABLET | Freq: Once | ORAL | Status: AC
  Administered 2021-04-16: 20 mg via ORAL
  Filled 2021-04-16: qty 1

## 2021-04-16 NOTE — Care Management Important Message (Signed)
Important Message  Patient Details  Name: Jody Blake MRN: [id number] Date of Birth: 11-30-1925   Medicare Important Message Given:  Yes     Shelda Altes 04/16/2021, 9:06 AM

## 2021-04-16 NOTE — Progress Notes (Signed)
[facility] PROGRESS NOTE   LACORA FOLMER IHK:[id number] DOB: 30-Sep-1925 DOA: 04/11/2021  PCP: [name], MD  Brief History/Interval Summary: 88 y.o. female with history of A. fib status post ablation and pacemaker placement, congestive heart failure last EF measured was more than 75% in 2020, chronic bronchitis, hypothyroidism, GERD has been experiencing increasing shortness of breath with productive cough and wheezing for 2 days which has been gradually worsening.  Patient was tested for COVID which was negative at the facility.  Despite therapy at the facility patient was getting more short of breath and was referred to the ER.  Imaging studies in the emergency department revealed a multifocal pneumonia with bilateral pleural effusion.  BNP was noted to be elevated.  There was concern the patient had both pneumonia as well as CHF.  She was hospitalized for further management.  Reason for Visit: Community-acquired pneumonia.  Acute diastolic CHF  Consultants: None  Procedures: None    Subjective/Interval History: Patient is extremely hard of hearing.  Noticed some shortness of breath this morning.  She is getting a nebulizer treatment currently.  Overall she is feeling better but continues to have cough with clear expectoration.     Assessment/Plan:  Community-acquired pneumonia/acute respiratory failure with hypoxia CT scan showed multifocal pneumonia.  COVID 19 PCR as well as influenza PCR's were negative.  Patient with productive cough for more than 2 days prior to admission.  She was started on ceftriaxone and azithromycin.  Was also started on vancomycin but MRSA PCR was negative.  Vancomycin was subsequently discontinued.   Patient also had significant wheezing.  Dose of prednisone had to be increased.  Budesonide was also initiated. Chest x-ray was repeated which showed stable findings.  Small pleural effusion was noted. She remains afebrile.  Respiratory status is  slowly getting better.  She has been weaned off of oxygen.  Wheezing has improved.  Cut back on steroids today.  Continues to have productive sputum.  Continue expectorants.  Continue Mucinex.  Mobilize.  Change to oral antibiotics today. Continue nebulized budesonide for another 24 hours.  Acute on chronic diastolic CHF/moderate aortic stenosis Echocardiogram shows normal systolic function.  Mild to moderate aortic stenosis was noted.  No worsening noted compared to previous echocardiogram.  Diastolic parameters were indeterminate. Has received 2 doses of furosemide during this hospital stay.  Holding off on further doses since volume status seems to be stable. Chest x-ray does show improvement in pulmonary edema. Mildly elevated troponin levels noted which could be reflective of mild demand ischemia from respiratory issues.  Patient denies any chest pain.  Do not anticipate any ischemic work-up in the hospital.  Can follow-up with her cardiologist in the outpatient setting.  Chronic bronchitis/acute wheezing She is on prednisone chronically.  Currently receiving higher dose due to wheezing.  Chest x-ray as discussed above.   Dose of steroid had to be increased and she was also placed on nebulized budesonide.  Wheezing appears to have improved.  Start cutting back on prednisone.    History of atrial fibrillation status post ablation/pacemaker placement Stable.  Continue metoprolol.  Also on Xarelto.  Hypothyroidism On levothyroxine.  TSH noted to be 0.317 which could be reflective of sick euthyroid.  Free T4 noted to be elevated at 1.34.  Dose of levothyroxine was decreased.   Essential hypertension/accelerated hypertension She was continued on losartan and metoprolol.  Blood pressure was poorly controlled secondary to steroids. She had to be given parenteral doses of hydralazine  with to which she experienced improvement.  She was also started on amlodipine and low-dose hydralazine.  As steroid  is tapered down her blood pressure should improve and we may have to take her off of some of these new medications.    Normocytic anemia Hemoglobin is stable.  Anemia panel unremarkable.  Hypokalemia Repleted.  Magnesium 2.2.  Positive urine culture Reveals 80,000 colonies of Enterobacter.  Significance unclear.  Not symptomatic.  Noted to be sensitive to ceftriaxone.   DVT Prophylaxis: Xarelto Code Status: DNR Family Communication: Son being updated every other day. Disposition Plan: SNF recommended by physical therapy.  She is from a independent living facility, [facility] which also has a skilled nursing level of care which is where she will be going at discharge.  Status is: Inpatient  Remains inpatient appropriate because: Acute respiratory failure with hypoxia, pneumonia       Medications: Scheduled:  albuterol  2.5 mg Nebulization TID   amLODipine  5 mg Oral Daily   budesonide (PULMICORT) nebulizer solution  0.5 mg Nebulization BID   famotidine  20 mg Oral QHS   gabapentin  100 mg Oral QHS   guaiFENesin  600 mg Oral BID   hydrALAZINE  50 mg Oral Q8H   latanoprost  1 drop Both Eyes QHS   levothyroxine  75 mcg Oral QAC breakfast   loratadine  10 mg Oral Daily   losartan  100 mg Oral BID   metoprolol succinate  50 mg Oral Daily   montelukast  10 mg Oral Daily   predniSONE  40 mg Oral Q breakfast   Rivaroxaban  15 mg Oral Q supper   Continuous:  azithromycin 500 mg (04/15/21 1045)   cefTRIAXone (ROCEPHIN)  IV Stopped (04/15/21 1200)   FMB:[id number] **OR** acetaminophen, albuterol, albuterol, guaiFENesin, hydrALAZINE  Antibiotics: Anti-infectives (From admission, onward)    Start     Dose/Rate Route Frequency Ordered Stop   04/13/21 1000  vancomycin (VANCOREADY) IVPB 1250 mg/250 mL  Status:  Discontinued        1,250 mg 166.7 mL/hr over 90 Minutes Intravenous Every 48 hours 04/11/21 1053 04/12/21 1142   04/12/21 1000  cefTRIAXone (ROCEPHIN) 2 g in sodium  chloride 0.9 % 100 mL IVPB        2 g 200 mL/hr over 30 Minutes Intravenous Every 24 hours 04/12/21 0415 04/17/21 0959   04/12/21 1000  azithromycin (ZITHROMAX) 500 mg in sodium chloride 0.9 % 250 mL IVPB        500 mg 250 mL/hr over 60 Minutes Intravenous Every 24 hours 04/12/21 0415 04/17/21 0959   04/11/21 1000  cefTRIAXone (ROCEPHIN) 1 g in sodium chloride 0.9 % 100 mL IVPB        1 g 200 mL/hr over 30 Minutes Intravenous  Once 04/11/21 0949 04/11/21 1336   04/11/21 1000  azithromycin (ZITHROMAX) 500 mg in sodium chloride 0.9 % 250 mL IVPB        500 mg 250 mL/hr over 60 Minutes Intravenous  Once 04/11/21 0949 04/11/21 1338   04/11/21 1000  vancomycin (VANCOCIN) IVPB 1000 mg/200 mL premix        1,000 mg 200 mL/hr over 60 Minutes Intravenous  Once 04/11/21 0950 04/11/21 1443   04/11/21 0945  levofloxacin (LEVAQUIN) IVPB 750 mg  Status:  Discontinued        750 mg 100 mL/hr over 90 Minutes Intravenous  Once 04/11/21 0937 04/11/21 0949       Objective:  Vital Signs  Vitals:  04/15/21 2042 04/16/21 0407 04/16/21 0800 04/16/21 0801  BP:  (!) 179/98    Pulse:  70    Resp: (!) 22 16    Temp:  97.9 F (36.6 C)    TempSrc:  Oral    SpO2:  97% 94% 94%  Weight:  57.4 kg    Height:        Intake/Output Summary (Last 24 hours) at 04/16/2021 0907 Last data filed at 04/16/2021 0826 Gross per 24 hour  Intake 752.96 ml  Output 250 ml  Net 502.96 ml    Filed Weights   04/14/21 0400 04/15/21 0343 04/16/21 0407  Weight: 57 kg 56.5 kg 57.4 kg    General appearance: Awake alert.  In no distress Resp: Improved effort.  No longer tachypneic.  No wheezing appreciated this morning.  Few crackles at the bases.  No rhonchi. Cardio: S1-S2 is normal regular.  No S3-S4.  No rubs murmurs or bruit GI: Abdomen is soft.  Nontender nondistended.  Bowel sounds are present normal.  No masses organomegaly Extremities: No edema.  Moving all 4 extremities Neurologic: No focal neurological deficits.        Lab Results:  Data Reviewed: I have personally reviewed following labs and imaging studies  CBC: Recent Labs  Lab 04/11/21 0950 04/12/21 0426 04/13/21 0401  WBC 11.7* 13.4* 11.8*  NEUTROABS 9.7* 10.7*  --   HGB 11.7* 11.5* 11.1*  HCT 36.7 36.0 35.0*  MCV 98.4 98.4 99.2  PLT 271 308 306     Basic Metabolic Panel: Recent Labs  Lab 04/11/21 0950 04/12/21 0426 04/13/21 0401 04/14/21 0357  NA 136 137 136 138  K 3.6 3.0* 3.2* 3.9  CL 99 99 102 105  CO2 26 26 24 26   GLUCOSE 148* 107* 130* 154*  BUN 19 16 20 19   CREATININE 0.93 1.03* 0.80 0.76  CALCIUM 9.3 8.9 8.9 9.5  MG  --  2.1  --  2.2     GFR: Estimated Creatinine Clearance: 36.3 mL/min (by C-G formula based on SCr of 0.76 mg/dL).  Liver Function Tests: Recent Labs  Lab 04/11/21 0950  AST 30  ALT 37  ALKPHOS 99  BILITOT 1.0  PROT 6.8  ALBUMIN 3.6      Coagulation Profile: Recent Labs  Lab 04/11/21 0950  INR 1.8*      Recent Results (from the past 240 hour(s))  SARS-COV-2 RNA,(COVID-19) QUAL NAAT     Status: None   Collection Time: 04/10/21  2:44 PM  Result Value Ref Range Status   SARS CoV2 RNA NOT DETECTED NOT DETECTED Final    Comment: . A Not Detected result means that SARS-CoV-2 RNA was not present in the specimen above the limit of detection. . A Not Detected result does not rule out the possibility of COVID-19 and should not be used as the sole basis for treatment or patient management decisions. If COVID-19 is still suspected, based on exposure history together with other clinical findings, re-testing should be  considered in the context of clinical observations and epidemiological data for patient management decisions. . Test Method: Nucleic Acid Amplification Test including reverse transcription polymerase chain reaction (RT-PCR) and transcription mediated amplification (TMA). The test method meets the [place] Centers for Disease Control and prevention (CDC) pre  departure and arrival requirement for viral test for COVID-19 dated May 06, 2019. Testing requirements for traveling may change with time. The patient is responsible for determining the test requirements for each nation while they ar e  traveling. . This test has been authorized by the FDA under an  Emergency Use Authorization (EUA) for use by authorized laboratories. . Please review the "Fact Sheets" and FDA authorized labeling available for health care providers and patients using the following websites: https://www.[facility].com/home/Covid-19/HCP/NAAT/fact-sheet2  https://www.[facility].com/home/Covid-19/Patients/NAAT/ fact-sheet2 . Due to the current public health emergency, [facility] is accepting samples from appropriate clinical sources collected using wide variety of swabs and transport media for COVID-19. Not detected test results derived from specimens received in non- commercially manufactured viral collection kits or those not yet authorized by FDA for COVID-19 testing should be cautiously evaluated and take extra precautions such as additional clinical monitoring, including collection of an additional specimen. . Additional information about COVID-19 can be fo und at the [employer] website: www.[facility].com/Covid19. . For patients with a Detected or Inconclusive test result, please see CDC's COVID-19 Treatments and  Medications page located at  BroadcastLocal.cz- severe-illness.html for information on COVID-19 therapeutics. . For patients with a Not Detected test result, please see CDC's Vaccines for COVID-19 page located at PhoneStatistics.is for information on COVID-19 vaccines.   Resp Panel by RT-PCR (Flu A&B, Covid) Nasopharyngeal Swab     Status: None   Collection Time: 04/11/21  9:13 AM   Specimen: Nasopharyngeal Swab;  Nasopharyngeal(NP) swabs in vial transport medium  Result Value Ref Range Status   SARS Coronavirus 2 by RT PCR NEGATIVE NEGATIVE Final    Comment: (NOTE) SARS-CoV-2 target nucleic acids are NOT DETECTED.  The SARS-CoV-2 RNA is generally detectable in upper respiratory specimens during the acute phase of infection. The lowest concentration of SARS-CoV-2 viral copies this assay can detect is 138 copies/mL. A negative result does not preclude SARS-Cov-2 infection and should not be used as the sole basis for treatment or other patient management decisions. A negative result may occur with  improper specimen collection/handling, submission of specimen other than nasopharyngeal swab, presence of viral mutation(s) within the areas targeted by this assay, and inadequate number of viral copies(<138 copies/mL). A negative result must be combined with clinical observations, patient history, and epidemiological information. The expected result is Negative.  Fact Sheet for Patients:  EntrepreneurPulse.com.au  Fact Sheet for Healthcare Providers:  IncredibleEmployment.be  This test is no t yet approved or cleared by the [place] FDA and  has been authorized for detection and/or diagnosis of SARS-CoV-2 by FDA under an Emergency Use Authorization (EUA). This EUA will remain  in effect (meaning this test can be used) for the duration of the COVID-19 declaration under Section 564(b)(1) of the Act, 21 U.S.C.section 360bbb-3(b)(1), unless the authorization is terminated  or revoked sooner.       Influenza A by PCR NEGATIVE NEGATIVE Final   Influenza B by PCR NEGATIVE NEGATIVE Final    Comment: (NOTE) The Xpert Xpress SARS-CoV-2/FLU/RSV plus assay is intended as an aid in the diagnosis of influenza from Nasopharyngeal swab specimens and should not be used as a sole basis for treatment. Nasal washings and aspirates are unacceptable for Xpert Xpress  SARS-CoV-2/FLU/RSV testing.  Fact Sheet for Patients: EntrepreneurPulse.com.au  Fact Sheet for Healthcare Providers: IncredibleEmployment.be  This test is not yet approved or cleared by the [place] FDA and has been authorized for detection and/or diagnosis of SARS-CoV-2 by FDA under an Emergency Use Authorization (EUA). This EUA will remain in effect (meaning this test can be used) for the duration of the COVID-19 declaration under Section 564(b)(1) of the Act, 21 U.S.C. section 360bbb-3(b)(1), unless the authorization is terminated or revoked.  Performed at [facility], [address] [place], [place] [address]   Blood Culture (routine x 2)     Status: None (Preliminary result)   Collection Time: 04/11/21  9:50 AM   Specimen: Right Antecubital; Blood  Result Value Ref Range Status   Specimen Description   Final    RIGHT ANTECUBITAL Performed at [facility], [address], [place], [place] [address]    Special Requests   Final    BOTTLES DRAWN AEROBIC AND ANAEROBIC Blood Culture results may not be optimal due to an excessive volume of blood received in culture bottles Performed at [facility], [address] [place], [place] [address]    Culture   Final    NO GROWTH 4 DAYS Performed at [facility], [place] [address], [place], [place] [address]    Report Status PENDING  Incomplete  Urine Culture     Status: Abnormal   Collection Time: 04/11/21  9:50 AM   Specimen: In/Out Cath Urine  Result Value Ref Range Status   Specimen Description   Final    IN/OUT CATH URINE Performed at [facility], [address] [place], [place] [address]    Special Requests   Final    NONE Performed at [facility], [address] [place], [place] [address]    Culture 80,000 COLONIES/mL ENTEROBACTER AEROGENES (A)  Final   Report Status  04/13/2021 FINAL  Final   Organism ID, Bacteria ENTEROBACTER AEROGENES (A)  Final      Susceptibility   Enterobacter aerogenes - MIC*    CEFAZOLIN RESISTANT Resistant     CEFEPIME <=0.12 SENSITIVE Sensitive     CEFTRIAXONE <=0.25 SENSITIVE Sensitive     CIPROFLOXACIN <=0.25 SENSITIVE Sensitive     GENTAMICIN <=1 SENSITIVE Sensitive     IMIPENEM 0.5 SENSITIVE Sensitive     NITROFURANTOIN 64 INTERMEDIATE Intermediate     TRIMETH/SULFA <=20 SENSITIVE Sensitive     PIP/TAZO <=4 SENSITIVE Sensitive     * 80,000 COLONIES/mL ENTEROBACTER AEROGENES  Culture, blood (Routine X 2) w Reflex to ID Panel     Status: None (Preliminary result)   Collection Time: 04/11/21 10:10 AM   Specimen: BLOOD LEFT FOREARM  Result Value Ref Range Status   Specimen Description   Final    BLOOD LEFT FOREARM Performed at [facility], [address], [place], [place] [address]    Special Requests   Final    BOTTLES DRAWN AEROBIC AND ANAEROBIC Blood Culture results may not be optimal due to an excessive volume of blood received in culture bottles Performed at [facility], [address], [place], [place] [address]    Culture   Final    NO GROWTH 4 DAYS Performed at [facility], [place] [address], [place], [place] [address]    Report Status PENDING  Incomplete  MRSA Next Gen by PCR, Nasal     Status: None   Collection Time: 04/12/21  4:14 AM   Specimen: Nasal Mucosa; Nasal Swab  Result Value Ref Range Status   MRSA by PCR Next Gen NOT DETECTED NOT DETECTED Final    Comment: (NOTE) The GeneXpert MRSA Assay (FDA approved for NASAL specimens only), is one component of a comprehensive MRSA colonization surveillance program. It is not intended to diagnose MRSA infection nor to guide or monitor treatment for MRSA infections. Test performance is not FDA approved in patients less than 39 years old. Performed at [facility], [place] [address], [place],  [place] [address]        Radiology Studies: DG CHEST PORT 1 VIEW  Result Date: 04/14/2021 CLINICAL DATA:  Shortness of breath. EXAM: PORTABLE CHEST 1 VIEW COMPARISON:  04/11/2021. FINDINGS: Mildly increased conspicuity of patchy consolidation in the right lung and similar dense left basilar opacity, better characterized on recent CT chest. No visible pneumothorax. Similar small left pleural effusion. Similar cardiomediastinal silhouette, mildly enlarged. Left subclavian approach cardiac rhythm maintenance device. Aortic atherosclerosis. IMPRESSION: 1. Mildly increased conspicuity of patchy consolidation in the right lung and similar dense left basilar opacity, better characterized on recent CT chest. 2. Similar small left pleural effusion. Electronically Signed   By: Margaretha Sheffield M.D.   On: 04/14/2021 10:07       LOS: 4 days   [facility] Pager on [email or web address]  04/16/2021, 9:07 AM

## 2021-04-16 NOTE — Progress Notes (Signed)
Mobility Specialist Progress Note: ° ° 04/16/21 1206  °Mobility  °Activity Ambulated in room;Transferred:  Bed to chair  °Level of Assistance Minimal assist, patient does 75% or more  °Assistive Device  °(HHA)  °Distance Ambulated (ft) 10 ft  °Mobility Ambulated with assistance in room;Out of bed to chair with meals  °Mobility Response Tolerated well  °Mobility performed by Mobility specialist  °Bed Position Chair  °$Mobility charge 1 Mobility  ° °Pt received in bed willing to participate in mobility. No complaints of pain. Pt left in chair with call bell in reach and all needs met.  ° °Jody Blake °Mobility Specialist °Primary Phone [phone] °Secondary Phone [phone] ° °

## 2021-04-17 DIAGNOSIS — J189 Pneumonia, unspecified organism: Secondary | ICD-10-CM | POA: Diagnosis not present

## 2021-04-17 LAB — CBC
HCT: 38.9 % (ref 36.0–46.0)
Hemoglobin: 12.5 g/dL (ref 12.0–15.0)
MCH: 31.3 pg (ref 26.0–34.0)
MCHC: 32.1 g/dL (ref 30.0–36.0)
MCV: 97.5 fL (ref 80.0–100.0)
Platelets: 407 10*3/uL — ABNORMAL HIGH (ref 150–400)
RBC: 3.99 MIL/uL (ref 3.87–5.11)
RDW: 13.5 % (ref 11.5–15.5)
WBC: 11.5 10*3/uL — ABNORMAL HIGH (ref 4.0–10.5)
nRBC: 0 % (ref 0.0–0.2)

## 2021-04-17 LAB — BASIC METABOLIC PANEL
Anion gap: 9 (ref 5–15)
BUN: 24 mg/dL — ABNORMAL HIGH (ref 8–23)
CO2: 22 mmol/L (ref 22–32)
Calcium: 8.8 mg/dL — ABNORMAL LOW (ref 8.9–10.3)
Chloride: 108 mmol/L (ref 98–111)
Creatinine, Ser: 0.84 mg/dL (ref 0.44–1.00)
GFR, Estimated: >60 mL/min (ref >60)
Glucose, Bld: 97 mg/dL (ref 70–99)
Potassium: 3.4 mmol/L — ABNORMAL LOW (ref 3.5–5.1)
Sodium: 139 mmol/L (ref 135–145)

## 2021-04-17 LAB — SARS CORONAVIRUS 2 (TAT 6-24 HRS): SARS Coronavirus 2: NEGATIVE

## 2021-04-17 LAB — LEGIONELLA PNEUMOPHILA SEROGP 1 UR AG: L. pneumophila Serogp 1 Ur Ag: NEGATIVE

## 2021-04-17 MED ORDER — FLUTICASONE PROPIONATE 50 MCG/ACT NA SUSP
2.0000 | Freq: Every day | NASAL | Status: DC
  Administered 2021-04-17 – 2021-04-18 (×2): 2 via NASAL
  Filled 2021-04-17: qty 16

## 2021-04-17 MED ORDER — POTASSIUM CHLORIDE 20 MEQ PO PACK
40.0000 meq | PACK | Freq: Once | ORAL | Status: AC
  Administered 2021-04-17: 40 meq via ORAL
  Filled 2021-04-17: qty 2

## 2021-04-17 NOTE — TOC Progression Note (Signed)
Transition of Care [facility]) - Progression Note    Patient Details  Name: Jody Blake MRN: [id number] Date of Birth: 1925-09-06  Transition of Care [facility]) CM/SW [place], [place] Phone Number: 04/17/2021, 2:34 PM  Clinical Narrative:    CSW contacted Butch Penny at [facility] to inform them that pt may DC tomorrow and confirm that pt would go to the SNF there at [facility]. CSW has requested COVID test.   Expected Discharge Plan: [place] Barriers to Discharge: SNF Pending transportation, Continued Medical Work up  Expected Discharge Plan and Services Expected Discharge Plan: [place] Choice: [place] arrangements for the past 2 months: [place]                                       Social Determinants of Health (SDOH) Interventions    Readmission Risk Interventions No flowsheet data found.

## 2021-04-17 NOTE — Progress Notes (Signed)
Mobility Specialist Progress Note:   04/17/21 1157  Mobility  Activity Ambulated in room;Transferred:  Bed to chair  Level of Assistance Minimal assist, patient does 75% or more  Assistive Device  (HHA)  Distance Ambulated (ft) 10 ft  Mobility Out of bed to chair with meals;Ambulated with assistance in room  Mobility Response Tolerated well  Mobility performed by Mobility specialist  Bed Position Chair  $Mobility charge 1 Mobility   Pt received in chair willing to participate in mobility. No complaints of pain. Pt left in chair with call bell in reach and all needs met.   [facility] Public librarian Phone [phone] Secondary Phone [phone]

## 2021-04-17 NOTE — Progress Notes (Signed)
PT Cancellation Note  Patient Details Name: Jody Blake MRN: [id number] DOB: [birth year]   Cancelled Treatment:    Reason Eval/Treat Not Completed: Other (comment).  Pt was unavailable for therapy and will retry at another time.   Ramond Dial 04/17/2021, 6:05 PM  Mee Hives, PT PhD Acute Rehab Dept. Number: [place] and [place]

## 2021-04-17 NOTE — Progress Notes (Addendum)
[facility] PROGRESS NOTE   Jody Blake WRU:[id number] DOB: [birth year] DOA: 04/11/2021  PCP: [name], MD  Brief History/Interval Summary: 88 y.o. female with history of A. fib status post ablation and pacemaker placement, congestive heart failure last EF measured was more than 75% in 2020, chronic bronchitis, hypothyroidism, GERD has been experiencing increasing shortness of breath with productive cough and wheezing for 2 days which has been gradually worsening.  Patient was tested for COVID which was negative at the facility.  Despite therapy at the facility patient was getting more short of breath and was referred to the ER.  Imaging studies in the emergency department revealed a multifocal pneumonia with bilateral pleural effusion.  BNP was noted to be elevated.  There was concern the patient had both pneumonia as well as CHF.  She was hospitalized for further management. Has been slow to improve.  Finally making some headway in the last 48 hours with improvement in wheezing and cough.  Hopefully discharge tomorrow to SNF.  Reason for Visit: Community-acquired pneumonia.  Acute diastolic CHF  Consultants: None  Procedures: None    Subjective/Interval History: Patient is hard of hearing.  Mentions that she is feeling better today compared to yesterday.  Still has cough early in the morning.  Attributes this to sinus drainage.  No other complaints offered.      Assessment/Plan:  Community-acquired pneumonia/acute respiratory failure with hypoxia CT scan showed multifocal pneumonia.  COVID 19 PCR as well as influenza PCR's were negative.  Patient with productive cough for more than 2 days prior to admission.  She was started on ceftriaxone and azithromycin.  Was also started on vancomycin but MRSA PCR was negative.  Vancomycin was subsequently discontinued.   Patient also had significant wheezing.  Dose of prednisone had to be increased.  Budesonide was also  initiated. Chest x-ray was repeated which showed stable findings.  Small pleural effusion was noted. Patient's respiratory status has improved.  Wheezing has resolved.  Steroid dose was decreased.  Continue expectorants.  We will add Flonase nasal spray since there could be an element of postnasal drip. Continue oral antibiotics.  Instead of 5 days we will give her 7-10-day treatment since she has been very slow to respond. Continue nebulized budesonide until discharge. She has been weaned off of oxygen.  Acute on chronic diastolic CHF/moderate aortic stenosis Echocardiogram shows normal systolic function.  Mild to moderate aortic stenosis was noted.  No worsening noted compared to previous echocardiogram.  Diastolic parameters were indeterminate. Has received 2 doses of furosemide during this hospital stay.  Holding off on further doses since volume status seems to be stable. Chest x-ray does show improvement in pulmonary edema. Mildly elevated troponin levels noted which could be reflective of mild demand ischemia from respiratory issues.  Patient denies any chest pain.  Do not anticipate any ischemic work-up in the hospital.  Can follow-up with her cardiologist in the outpatient setting.  Chronic bronchitis/acute wheezing She is on prednisone chronically.  Currently receiving higher dose due to wheezing.  Chest x-ray as discussed above.   Dose of steroid had to be increased and she was also placed on nebulized budesonide.  Wheezing appears to have improved.  Steroid dose was decreased.  History of atrial fibrillation status post ablation/pacemaker placement Stable.  Continue metoprolol.  Also on Xarelto.  Hypothyroidism On levothyroxine.  TSH noted to be 0.317 which could be reflective of sick euthyroid.  Free T4 noted to be elevated at 1.34.  Dose of levothyroxine was decreased.   Essential hypertension/accelerated hypertension She was continued on losartan and metoprolol which she was on  prior to admission.  Blood pressure was poorly controlled secondary to steroids. She had to be given parenteral doses of hydralazine with which she experienced improvement.  She was started on amlodipine and low-dose hydralazine.   As steroid is tapered down her blood pressure should improve and we may have to take her off of some of these new medications.   Blood pressure has been stable and better in the last 24 hours.  Normocytic anemia Hemoglobin is stable.  Anemia panel unremarkable.  Hypokalemia Replace potassium.  Magnesium was 2.2 when last checked.  We will recheck it tomorrow  Positive urine culture Reveals 80,000 colonies of Enterobacter.  Significance unclear.  Not symptomatic.  Noted to be sensitive to ceftriaxone.   DVT Prophylaxis: Xarelto Code Status: DNR Family Communication: Son being updated every other day. Disposition Plan: SNF recommended by physical therapy.  She is from a independent living facility, [facility] which also has a skilled nursing level of care which is where she will be going at discharge.  Anticipate discharge tomorrow.  Status is: Inpatient  Remains inpatient appropriate because: Acute respiratory failure with hypoxia, pneumonia       Medications: Scheduled:  amLODipine  5 mg Oral Daily   budesonide (PULMICORT) nebulizer solution  0.5 mg Nebulization BID   cefdinir  300 mg Oral Q12H   famotidine  20 mg Oral QHS   fluticasone  2 spray Each Nare Daily   gabapentin  100 mg Oral QHS   guaiFENesin  600 mg Oral BID   hydrALAZINE  50 mg Oral Q8H   latanoprost  1 drop Both Eyes QHS   levothyroxine  75 mcg Oral QAC breakfast   loratadine  10 mg Oral Daily   losartan  100 mg Oral BID   metoprolol succinate  50 mg Oral Daily   montelukast  10 mg Oral Daily   predniSONE  20 mg Oral Q breakfast   Rivaroxaban  15 mg Oral Q supper   Continuous:   ASN:[id number] **OR** acetaminophen, albuterol, albuterol, guaiFENesin,  hydrALAZINE  Antibiotics: Anti-infectives (From admission, onward)    Start     Dose/Rate Route Frequency Ordered Stop   04/17/21 1000  cefdinir (OMNICEF) capsule 300 mg        300 mg Oral Every 12 hours 04/16/21 0912     04/13/21 1000  vancomycin (VANCOREADY) IVPB 1250 mg/250 mL  Status:  Discontinued        1,250 mg 166.7 mL/hr over 90 Minutes Intravenous Every 48 hours 04/11/21 1053 04/12/21 1142   04/12/21 1000  cefTRIAXone (ROCEPHIN) 2 g in sodium chloride 0.9 % 100 mL IVPB        2 g 200 mL/hr over 30 Minutes Intravenous Every 24 hours 04/12/21 0415 04/16/21 1343   04/12/21 1000  azithromycin (ZITHROMAX) 500 mg in sodium chloride 0.9 % 250 mL IVPB        500 mg 250 mL/hr over 60 Minutes Intravenous Every 24 hours 04/12/21 0415 04/16/21 1343   04/11/21 1000  cefTRIAXone (ROCEPHIN) 1 g in sodium chloride 0.9 % 100 mL IVPB        1 g 200 mL/hr over 30 Minutes Intravenous  Once 04/11/21 0949 04/11/21 1336   04/11/21 1000  azithromycin (ZITHROMAX) 500 mg in sodium chloride 0.9 % 250 mL IVPB        500 mg 250 mL/hr over  60 Minutes Intravenous  Once 04/11/21 0949 04/11/21 1338   04/11/21 1000  vancomycin (VANCOCIN) IVPB 1000 mg/200 mL premix        1,000 mg 200 mL/hr over 60 Minutes Intravenous  Once 04/11/21 0950 04/11/21 1443   04/11/21 0945  levofloxacin (LEVAQUIN) IVPB 750 mg  Status:  Discontinued        750 mg 100 mL/hr over 90 Minutes Intravenous  Once 04/11/21 0937 04/11/21 0949       Objective:  Vital Signs  Vitals:   04/16/21 2119 04/17/21 0400 04/17/21 0500 04/17/21 0747  BP: (!) 164/81 (!) 148/65 139/64   Pulse:  64    Resp:  19    Temp:  97.9 F (36.6 C)    TempSrc:  Oral    SpO2:  94%  95%  Weight:  57.6 kg    Height:        Intake/Output Summary (Last 24 hours) at 04/17/2021 1043 Last data filed at 04/17/2021 0818 Gross per 24 hour  Intake 838.79 ml  Output 0 ml  Net 838.79 ml    Filed Weights   04/15/21 0343 04/16/21 0407 04/17/21 0400  Weight:  56.5 kg 57.4 kg 57.6 kg    General appearance: Awake alert.  In no distress Resp: Clear to auscultation bilaterally.  Normal effort Cardio: S1-S2 is normal regular.  No S3-S4.  No rubs murmurs or bruit GI: Abdomen is soft.  Nontender nondistended.  Bowel sounds are present normal.  No masses organomegaly Extremities: No edema.  Full range of motion of lower extremities. Neurologic:  No focal neurological deficits.      Lab Results:  Data Reviewed: I have personally reviewed following labs and imaging studies  CBC: Recent Labs  Lab 04/11/21 0950 04/12/21 0426 04/13/21 0401 04/17/21 0301  WBC 11.7* 13.4* 11.8* 11.5*  NEUTROABS 9.7* 10.7*  --   --   HGB 11.7* 11.5* 11.1* 12.5  HCT 36.7 36.0 35.0* 38.9  MCV 98.4 98.4 99.2 97.5  PLT 271 308 306 407*     Basic Metabolic Panel: Recent Labs  Lab 04/11/21 0950 04/12/21 0426 04/13/21 0401 04/14/21 0357 04/17/21 0301  NA 136 137 136 138 139  K 3.6 3.0* 3.2* 3.9 3.4*  CL 99 99 102 105 108  CO2 26 26 24 26 22   GLUCOSE 148* 107* 130* 154* 97  BUN 19 16 20 19  24*  CREATININE 0.93 1.03* 0.80 0.76 0.84  CALCIUM 9.3 8.9 8.9 9.5 8.8*  MG  --  2.1  --  2.2  --      GFR: Estimated Creatinine Clearance: 34.6 mL/min (by C-G formula based on SCr of 0.84 mg/dL).  Liver Function Tests: Recent Labs  Lab 04/11/21 0950  AST 30  ALT 37  ALKPHOS 99  BILITOT 1.0  PROT 6.8  ALBUMIN 3.6      Coagulation Profile: Recent Labs  Lab 04/11/21 0950  INR 1.8*      Recent Results (from the past 240 hour(s))  SARS-COV-2 RNA,(COVID-19) QUAL NAAT     Status: None   Collection Time: 04/10/21  2:44 PM  Result Value Ref Range Status   SARS CoV2 RNA NOT DETECTED NOT DETECTED Final    Comment: . A Not Detected result means that SARS-CoV-2 RNA was not present in the specimen above the limit of detection. . A Not Detected result does not rule out the possibility of COVID-19 and should not be used as the sole basis for treatment  or patient management  decisions. If COVID-19 is still suspected, based on exposure history together with other clinical findings, re-testing should be  considered in the context of clinical observations and epidemiological data for patient management decisions. . Test Method: Nucleic Acid Amplification Test including reverse transcription polymerase chain reaction (RT-PCR) and transcription mediated amplification (TMA). The test method meets the [place] Centers for Disease Control and prevention (CDC) pre departure and arrival requirement for viral test for COVID-19 dated May 06, 2019. Testing requirements for traveling may change with time. The patient is responsible for determining the test requirements for each nation while they ar e traveling. . This test has been authorized by the FDA under an  Emergency Use Authorization (EUA) for use by authorized laboratories. . Please review the "Fact Sheets" and FDA authorized labeling available for health care providers and patients using the following websites: https://[email or web address]/fact-[email or web address]  [email or web address]://[email or web address]/home/Covid-19/Patients/NAAT/ fact-sheet2 . Due to the current public health emergency, [facility] is accepting samples from appropriate clinical sources collected using wide variety of swabs and transport media for COVID-19. Not detected test results derived from specimens received in non- commercially manufactured viral collection kits or those not yet authorized by FDA for COVID-19 testing should be cautiously evaluated and take extra precautions such as additional clinical monitoring, including collection of an additional specimen. . Additional information about COVID-19 can be fo und at the [employer] website: www.[email or web address]/[email or web address]. . For patients with a Detected or Inconclusive test result, please see CDC's COVID-19 Treatments and   Medications page located at  BroadcastLocal.cz- severe-illness.html for information on COVID-19 therapeutics. . For patients with a Not Detected test result, please see CDC's Vaccines for COVID-19 page located at PhoneStatistics.is for information on COVID-19 vaccines.   Resp Panel by RT-PCR (Flu A&B, Covid) Nasopharyngeal Swab     Status: None   Collection Time: 04/11/21  9:13 AM   Specimen: Nasopharyngeal Swab; Nasopharyngeal(NP) swabs in vial transport medium  Result Value Ref Range Status   SARS Coronavirus 2 by RT PCR NEGATIVE NEGATIVE Final    Comment: (NOTE) SARS-CoV-2 target nucleic acids are NOT DETECTED.  The SARS-CoV-2 RNA is generally detectable in upper respiratory specimens during the acute phase of infection. The lowest concentration of SARS-CoV-2 viral copies this assay can detect is 138 copies/mL. A negative result does not preclude SARS-Cov-2 infection and should not be used as the sole basis for treatment or other patient management decisions. A negative result may occur with  improper specimen collection/handling, submission of specimen other than nasopharyngeal swab, presence of viral mutation(s) within the areas targeted by this assay, and inadequate number of viral copies(<138 copies/mL). A negative result must be combined with clinical observations, patient history, and epidemiological information. The expected result is Negative.  Fact Sheet for Patients:  EntrepreneurPulse.com.au  Fact Sheet for Healthcare Providers:  IncredibleEmployment.be  This test is no t yet approved or cleared by the [place] FDA and  has been authorized for detection and/or diagnosis of SARS-CoV-2 by FDA under an Emergency Use Authorization (EUA). This EUA will remain  in effect (meaning this test can be used) for the duration of the COVID-19  declaration under Section 564(b)(1) of the Act, 21 U.S.C.section 360bbb-3(b)(1), unless the authorization is terminated  or revoked sooner.       Influenza A by PCR NEGATIVE NEGATIVE Final   Influenza B by PCR NEGATIVE NEGATIVE Final    Comment: (NOTE) The Xpert Xpress SARS-CoV-2/FLU/RSV plus assay is intended as an aid in the diagnosis of influenza from Nasopharyngeal swab specimens  and should not be used as a sole basis for treatment. Nasal washings and aspirates are unacceptable for Xpert Xpress SARS-CoV-2/FLU/RSV testing.  Fact Sheet for Patients: EntrepreneurPulse.com.au  Fact Sheet for Healthcare Providers: IncredibleEmployment.be  This test is not yet approved or cleared by the [place] FDA and has been authorized for detection and/or diagnosis of SARS-CoV-2 by FDA under an Emergency Use Authorization (EUA). This EUA will remain in effect (meaning this test can be used) for the duration of the COVID-19 declaration under Section 564(b)(1) of the Act, 21 U.S.C. section 360bbb-3(b)(1), unless the authorization is terminated or revoked.  Performed at [facility], [address], [place], [place] [address]   Blood Culture (routine x 2)     Status: None   Collection Time: 04/11/21  9:50 AM   Specimen: Right Antecubital; Blood  Result Value Ref Range Status   Specimen Description   Final    RIGHT ANTECUBITAL Performed at [facility], [address], [place], [place] [address]    Special Requests   Final    BOTTLES DRAWN AEROBIC AND ANAEROBIC Blood Culture results may not be optimal due to an excessive volume of blood received in culture bottles Performed at [facility], [address] [place], [place] [address]    Culture   Final    NO GROWTH 5 DAYS Performed at [facility], [place] [address], [place], [place] [address]    Report Status 04/16/2021 FINAL  Final   Urine Culture     Status: Abnormal   Collection Time: 04/11/21  9:50 AM   Specimen: In/Out Cath Urine  Result Value Ref Range Status   Specimen Description   Final    IN/OUT CATH URINE Performed at [facility], [address], [place], [place] [address]    Special Requests   Final    NONE Performed at [facility], [address], [place], [place] [address]    Culture 80,000 COLONIES/mL ENTEROBACTER AEROGENES (A)  Final   Report Status 04/13/2021 FINAL  Final   Organism ID, Bacteria ENTEROBACTER AEROGENES (A)  Final      Susceptibility   Enterobacter aerogenes - MIC*    CEFAZOLIN RESISTANT Resistant     CEFEPIME <=0.12 SENSITIVE Sensitive     CEFTRIAXONE <=0.25 SENSITIVE Sensitive     CIPROFLOXACIN <=0.25 SENSITIVE Sensitive     GENTAMICIN <=1 SENSITIVE Sensitive     IMIPENEM 0.5 SENSITIVE Sensitive     NITROFURANTOIN 64 INTERMEDIATE Intermediate     TRIMETH/SULFA <=20 SENSITIVE Sensitive     PIP/TAZO <=4 SENSITIVE Sensitive     * 80,000 COLONIES/mL ENTEROBACTER AEROGENES  Culture, blood (Routine X 2) w Reflex to ID Panel     Status: None   Collection Time: 04/11/21 10:10 AM   Specimen: BLOOD LEFT FOREARM  Result Value Ref Range Status   Specimen Description   Final    BLOOD LEFT FOREARM Performed at [facility], [address] [place], [place] [address]    Special Requests   Final    BOTTLES DRAWN AEROBIC AND ANAEROBIC Blood Culture results may not be optimal due to an excessive volume of blood received in culture bottles Performed at [facility], [address] [place], [place] [address]    Culture   Final    NO GROWTH 5 DAYS Performed at [facility], [place] [address], [place], [place] [address]    Report Status 04/16/2021 FINAL  Final  MRSA Next  Gen by PCR, Nasal     Status: None   Collection Time: 04/12/21  4:14 AM   Specimen: Nasal Mucosa; Nasal Swab  Result Value Ref Range  Status   MRSA by PCR Next Gen NOT DETECTED NOT DETECTED Final    Comment: (NOTE) The GeneXpert MRSA Assay (FDA approved for NASAL specimens only), is one component of a comprehensive MRSA colonization surveillance program. It is not intended to diagnose MRSA infection nor to guide or monitor treatment for MRSA infections. Test performance is not FDA approved in patients less than 26 years old. Performed at [facility], [place] [address] [place], [place] [address]        Radiology Studies: No results found.     LOS: 5 days   Gerrell Tabet [employer] on [email or web address]  04/17/2021, 10:43 AM

## 2021-04-17 NOTE — Progress Notes (Signed)
Occupational Therapy Treatment Patient Details Name: Jody Blake MRN: [id number] DOB: 11-30-1925 Today's Date: 04/17/2021   History of present illness 88 y.o. female presenting to ED 1/4 with worsening SOB, productive cough and wheezing x2 days. COVID-19 PCR (-). Imaging (+) multifocal pneumonia with bilateral pleural effusion. Patient also found to have acute on chronic CHF exacerbation. PMHx significant for A-fib status post ablation and pacemaker placement, CHF, chronic bronchitis, HTN, thyroid disease, and GERD.   OT comments  OT treatment session with focus on self-care re-education, ADL transfers, functional mobility with use of RW and OOB activity tolerance. Patient completed 3/3 parts of toileting task with Min A, UB bathing/dressing with Min A and LB bathing/dressing with Mod A overall. VSS on RA throughout despite constant cough with secretions. Patient continues to be limited by deficits listed below and would benefit from continued acute OT services in prep for safe d/c to next level of care. Continued recommendation for SNF rehab.    Recommendations for follow up therapy are one component of a multi-disciplinary discharge planning process, led by the attending physician.  Recommendations may be updated based on patient status, additional functional criteria and insurance authorization.    Follow Up Recommendations  Skilled nursing-short term rehab (<3 hours/day)    Assistance Recommended at Discharge Frequent or constant Supervision/Assistance  Patient can return home with the following      Equipment Recommendations  Other (comment) (Defer to next level of care.)    Recommendations for Other Services      Precautions / Restrictions Precautions Precautions: Fall Precaution Comments: HOH; low vision Restrictions Weight Bearing Restrictions: No       Mobility Bed Mobility Overal bed mobility: Needs Assistance Bed Mobility: Supine to Sit     Supine to sit: Min  assist;HOB elevated     General bed mobility comments: Min A at trunk with HOB slightly elevated +bed rail.    Transfers Overall transfer level: Needs assistance Equipment used: Rolling walker (2 wheels) Transfers: Sit to/from Stand Sit to Stand: Min guard;Min assist           General transfer comment: Min A for elevated surfaces and Min A from low surfaces. Cues for hand placement.     Balance Overall balance assessment: Needs assistance Sitting-balance support: No upper extremity supported;Feet supported Sitting balance-Leahy Scale: Fair     Standing balance support: Bilateral upper extremity supported;During functional activity;Reliant on assistive device for balance Standing balance-Leahy Scale: Poor Standing balance comment: Reliant on RW                           ADL either performed or assessed with clinical judgement   ADL Overall ADL's : Needs assistance/impaired Eating/Feeding: Set up;Sitting       Upper Body Bathing: Minimal assistance;Sitting   Lower Body Bathing: Minimal assistance;Sit to/from stand   Upper Body Dressing : Set up;Sitting   Lower Body Dressing: Moderate assistance;Sit to/from stand Lower Body Dressing Details (indicate cue type and reason): Assist to thread BLE through brief. Able to hike over hips in standing with steadying assist and unilateral UE support on RW. Assist to doff/don footwear. Toilet Transfer: Minimal assistance;Rolling walker (2 wheels) Toilet Transfer Details (indicate cue type and reason): To BSC with use of RW. Cues for hand placement and proximity to RW. Toileting- Clothing Manipulation and Hygiene: Minimal assistance;Sit to/from stand Toileting - Clothing Manipulation Details (indicate cue type and reason): Min A for hygiene/clothing management.  Extremity/Trunk Assessment              Vision       Perception     Praxis      Cognition Arousal/Alertness: Awake/alert Behavior  During Therapy: WFL for tasks assessed/performed Overall Cognitive Status: Within Functional Limits for tasks assessed                                            Exercises     Shoulder Instructions       General Comments VSS on RA. Constant cough with secretions throughout session.    Pertinent Vitals/ Pain       Pain Assessment: No/denies pain Pain Intervention(s): Monitored during session  Home Living                                          Prior Functioning/Environment              Frequency  Min 2X/week        Progress Toward Goals  OT Goals(current goals can now be found in the care plan section)  Progress towards OT goals: Progressing toward goals  Acute Rehab OT Goals Patient Stated Goal: To go to SNF rehab. OT Goal Formulation: With patient/family Time For Goal Achievement: 04/26/21 Potential to Achieve Goals: Good ADL Goals Pt Will Perform Grooming: standing;with modified independence Pt Will Perform Upper Body Dressing: sitting;with modified independence Pt Will Perform Lower Body Dressing: with modified independence;sit to/from stand Pt Will Transfer to Toilet: with modified independence;bedside commode Pt Will Perform Toileting - Clothing Manipulation and hygiene: with modified independence;sit to/from stand Pt Will Perform Tub/Shower Transfer: Shower transfer;with modified independence;shower seat Pt/caregiver will Perform Home Exercise Program: Increased strength;Both right and left upper extremity;With written HEP provided  Plan Discharge plan remains appropriate;Frequency remains appropriate    Co-evaluation                 AM-PAC OT "6 Clicks" Daily Activity     Outcome Measure   Help from another person eating meals?: None Help from another person taking care of personal grooming?: A Little Help from another person toileting, which includes using toliet, bedpan, or urinal?: A Little Help from  another person bathing (including washing, rinsing, drying)?: A Lot Help from another person to put on and taking off regular upper body clothing?: A Little Help from another person to put on and taking off regular lower body clothing?: A Lot 6 Click Score: 17    End of Session Equipment Utilized During Treatment: Gait belt;Rolling walker (2 wheels)  OT Visit Diagnosis: Unsteadiness on feet (R26.81);Muscle weakness (generalized) (M62.81)   Activity Tolerance Patient tolerated treatment well   Patient Left in chair;with call bell/phone within reach;with chair alarm set   Nurse Communication Mobility status        Time: 0923-3007 OT Time Calculation (min): 34 min  Charges: OT General Charges $OT Visit: 1 Visit OT Treatments $Self Care/Home Management : 23-37 mins  Ladajah Soltys H. OTR/L Supplemental OT, Department of rehab services [phone]  Berlyn Saylor R H. 04/17/2021, 7:39 AM

## 2021-04-18 DIAGNOSIS — Z950 Presence of cardiac pacemaker: Secondary | ICD-10-CM | POA: Diagnosis not present

## 2021-04-18 DIAGNOSIS — I5031 Acute diastolic (congestive) heart failure: Secondary | ICD-10-CM | POA: Diagnosis not present

## 2021-04-18 DIAGNOSIS — J189 Pneumonia, unspecified organism: Secondary | ICD-10-CM | POA: Diagnosis not present

## 2021-04-18 DIAGNOSIS — I5033 Acute on chronic diastolic (congestive) heart failure: Secondary | ICD-10-CM | POA: Diagnosis not present

## 2021-04-18 LAB — BASIC METABOLIC PANEL
Anion gap: 9 (ref 5–15)
BUN: 30 mg/dL — ABNORMAL HIGH (ref 8–23)
CO2: 25 mmol/L (ref 22–32)
Calcium: 9.1 mg/dL (ref 8.9–10.3)
Chloride: 107 mmol/L (ref 98–111)
Creatinine, Ser: 1.04 mg/dL — ABNORMAL HIGH (ref 0.44–1.00)
GFR, Estimated: 50 mL/min — ABNORMAL LOW (ref >60)
Glucose, Bld: 83 mg/dL (ref 70–99)
Potassium: 3.8 mmol/L (ref 3.5–5.1)
Sodium: 141 mmol/L (ref 135–145)

## 2021-04-18 LAB — MAGNESIUM: Magnesium: 2.5 mg/dL — ABNORMAL HIGH (ref 1.7–2.4)

## 2021-04-18 MED ORDER — GUAIFENESIN 100 MG/5ML PO LIQD: 5.0000 mL | Freq: Four times a day (QID) | ORAL | 0 refills | Status: DC | PRN

## 2021-04-18 MED ORDER — CEFDINIR 300 MG PO CAPS: 300.0000 mg | ORAL_CAPSULE | Freq: Two times a day (BID) | ORAL | 0 refills | Status: AC

## 2021-04-18 MED ORDER — LEVOTHYROXINE SODIUM 75 MCG PO TABS: 75.0000 ug | ORAL_TABLET | Freq: Every day | ORAL | 0 refills | Status: DC

## 2021-04-18 NOTE — TOC Transition Note (Addendum)
Transition of Care [facility]) - CM/SW Discharge Note   Patient Details  Name: EYMI LIPUMA MRN: [id number] Date of Birth: [birth year]  [facility] CM/SW Contact:  Tresa Endo Phone Number: 04/18/2021, 11:50 AM   Clinical Narrative:    Patient will DC to: [facility] Anticipated DC date: 04/18/2020 Family notified: Pt Daughter Transport by: Pt Daughter   Per MD patient ready for DC to [facility] RN to call report prior to discharge [phone]). RN, patient, patient's family, and facility notified of DC. Discharge Summary and FL2 sent to facility. DC packet on chart.    CSW will sign off for now as social work intervention is no longer needed. Please consult [place] again if new needs arise.       Barriers to Discharge: SNF Pending transportation, Continued Medical Work up   Patient Goals and CMS Choice Patient states their goals for this hospitalization and ongoing recovery are:: Pt and family wanting to go to [place] and eventually return to [place]. CMS Medicare.gov Compare Post Acute Care list provided to:: Patient Represenative (must comment) (Cheryl,Daughter) Choice offered to / list presented to : Patient, Adult Children  Discharge Placement                       Discharge Plan and Services     Post Acute Care Choice: [place]                               Social Determinants of Health (SDOH) Interventions     Readmission Risk Interventions No flowsheet data found.

## 2021-04-18 NOTE — Discharge Summary (Addendum)
Physician Discharge Summary  JENCY SCHNIEDERS PYK:[id number] DOB: [birth year] DOA: 04/11/2021  PCP: [name], MD  Admit date: 04/11/2021 Discharge date: 04/18/2021  Admitted From: Assisted living facility.  Disposition:   SNF  Recommendations for Outpatient Follow-up and new medication changes:  Follow up with Dr. Lyndel Safe in 7 to 10 days.  Continue antibiotic therapy with Cefdinir for 2 more days.  Continue with airway clearing techniques with incentive spirometer and antitussive agents.  Chronic therapy with prednisone.   I spoke over the phone with the patient's daughter about patient's  condition, plan of care, prognosis and all questions were addressed.   Home Health: na   Equipment/Devices: na    Discharge Condition: stable  CODE STATUS: DNR Diet recommendation:  heart healthy   Brief/Interim Summary: Mrs. Zammit was admitted to the hospital with the working diagnosis of acute hypoxemic respiratory failure due to community acquired pneumonia.   88 yo female with the past medical history of atrial fibrillation, sp ablation, pacemaker implantation, diastolic heart failure, chronic bronchitis, hypothyroidism, and GERD who presented with dyspnea. Reported shortness of breath, cough and wheezing for 2 days. Worsening symptoms that prompted her to come to the hospital. On her initial physical examination her blood pressure was 174/75, HR 73, RR 25 - 31, temp 97,9 and oxygen saturation 96% on supplemental 02 per [place] and  88% on room air. Her lungs had bilateral wheezing, but no rhonchi, heart with S1 and S2 present and rhythmic, abdomen soft and no lower extremity edema.   Na 136, K 3,6, Cl 99, bicarb 26, glucose 148, bun 19 and cr 0,93. Wbc 11,7, hgb 11,7 hct 36,7 and Plt 271. SARS COVID 19 negative  Urine analysis with specific gravity 1,006 wbc 0-5  Chest radiograph with cardiomegaly, small left pleural effusion, increased lung markings bilaterally with patchy infiltrate at the right  base, (personally reviewed).  CT chest negative for pulmonary embolism. Multiple patchy airspace opacities, bilaterally predominately at the right base. Small left pleural effusion.   EKG 70 bpm, left axis deviation, right bundle branch block, left anterior fascicular block, atrial flutter rhythm, no significant ST segment or T wave changes.   Patient was placed on supplemental 02 and antibiotic therapy with good response.  Heart failure exacerbation, received diuresis.   Chronic on chronic bronchitis, COPD exacerbation, received systemic steroids.   Acute hypoxemic respiratory failure due to community acquired pneumonia (present on admission), multilobar, predominantly right lower lobe, complicated with acute COPD exacerbation (chronic bronchitis).  Patient was admitted to the telemetry unit, she was placed on broad spectrum antibiotic therapy with good toleration. Supplemental 02, bronchodilator therapy, systemic steroids and antitussive agents. Inhaled steroids.  Her condition slowly improved, at the time of her discharge her dyspnea has improved, but continue to have intermittent cough.  Her oxymetry is  96% on room air. Antibiotic therapy was transition to po, with oral Cefdinir, to complete 8 days.   Continue bronchodilator therapy, inhaled steroids and oral prednisone 5 mg (chronic dosing).   Patient very weak and deconditioned, PT and OT recommended continue therapy at SNF.   2. Acute diastolic heart failure, severe tricuspid regurgitation, severe pulmonary hypertension, Chronic core pulmonale.  Patient has a preserved LV systolic function. EF 65 to 70%, moderate LVH, right atrium with severe dilatation. Severe tricuspid regurgitation, mild to moderate aortic regurgitation. RV systolic function low normal, with severe elevated pulmonary artery systolic pressure.  Positive signs of volume overload. Patient was placed on IV furosemide with improvement of  her symptoms.   At discharge  patient will continue with oral furosemide.   3. Chronic atrial fibrillation, atrial flutter, sp pacer and ablation. Her rate remained well controlled with metoprolol, continue anticoagulation with rivaroxaban.   4. HTN.  Patient had uncontrolled hypertension, requires hydralazine. At discharge will resume her antihypertensive regimen with losartan and diuresis with oral furosemide.   5. Hypokalemia. Renal function has remained stable, K was corrected.  At discharge her cr is 1,0 with K at 3,8 and serum bicarbonate at 25.  Continue home K supplements along with oral furosemide.   6. Bacteriuria 80,000 CFU Enterobacter, no urinary symptoms, urine infection ruled out.  7. Hypothyroid.  TSH low and free T 4 elevated, her dose of levothyroxine was decreased to 75, continue follow up TFT in 3 weeks.   Discharge Diagnoses:  Principal Problem:   Acute on chronic diastolic CHF (congestive heart failure) (HCC) Active Problems:   Essential hypertension   COPD mixed type (HCC)   Cardiac pacemaker in situ   Hypothyroidism   CAP (community acquired pneumonia)   Acute diastolic CHF (congestive heart failure) ([place])    Discharge Instructions   Allergies as of 04/18/2021       Reactions   Brimonidine Tartrate-timolol Other (See Comments)   REACTION: systemic malaise amigen eye drop   Penicillins Hives   Breo Ellipta [fluticasone Furoate-vilanterol] Other (See Comments)   Ran blood pressure up Increased blood pressure   Vancomycin Other (See Comments)   Red man syndrome - Mild redness and discomfort. Able to complete initial dose.         Medication List     STOP taking these medications    doxycycline 100 MG tablet Commonly known as: VIBRA-TABS       TAKE these medications    Azelastine HCl 137 MCG/SPRAY Soln USE 1 TO 2 SPRAYS INTO BOTH NOSTRILS TWICE DAILY.   cefdinir 300 MG capsule Commonly known as: OMNICEF Take 1 capsule (300 mg total) by mouth every 12 (twelve)  hours for 2 days.   famotidine 40 MG tablet Commonly known as: Pepcid Take 1 tablet (40 mg total) by mouth at bedtime.   fluticasone 50 MCG/ACT nasal spray Commonly known as: FLONASE USE 2 SPRAYS EACH NOSTRIL ONCE A DAY AS NEEDED FOR ALLERGIES OR CONGESTION.   furosemide 20 MG tablet Commonly known as: LASIX Take 20 mg by mouth 2 (two) times a week.   furosemide 40 MG tablet Commonly known as: LASIX TAKE 1 AND 1/2 TABLETS (60MG ) BY MOUTH ONCE DAILY   gabapentin 100 MG capsule Commonly known as: NEURONTIN Take 1 capsule (100 mg total) by mouth at bedtime.   guaiFENesin 100 MG/5ML liquid Commonly known as: ROBITUSSIN Take 5 mLs by mouth every 6 (six) hours as needed for cough or to loosen phlegm.   latanoprost 0.005 % ophthalmic solution Commonly known as: XALATAN Place 1 drop into both eyes at bedtime.   levothyroxine 75 MCG tablet Commonly known as: SYNTHROID Take 1 tablet (75 mcg total) by mouth daily before breakfast. Start taking on: April 19, 2021 What changed:  medication strength how much to take   loratadine 10 MG tablet Commonly known as: CLARITIN Take 1 tablet (10 mg total) by mouth daily.   losartan 100 MG tablet Commonly known as: COZAAR Take 100 mg by mouth 2 (two) times daily.   Lutein 20 MG Caps Take 1 capsule (20 mg total) by mouth daily.   Magnesium 250 MG Tabs Take 1 tablet by  mouth daily.   metoprolol succinate 50 MG 24 hr tablet Commonly known as: Toprol XL Take 1 tablet (50 mg total) by mouth daily. Take with or immediately following a meal.   montelukast 10 MG tablet Commonly known as: SINGULAIR Take 1 tablet (10 mg total) by mouth daily.   ofloxacin 0.3 % ophthalmic solution Commonly known as: OCUFLOX Place 1 drop into both eyes 4 (four) times daily.   potassium chloride 10 MEQ tablet Commonly known as: KLOR-CON M Take 20 mEq by mouth daily.   predniSONE 5 MG tablet Commonly known as: DELTASONE Take 1 tablet (5 mg total) by  mouth daily with breakfast.   PreserVision AREDS 2 Caps Take 1 capsule by mouth in the morning and at bedtime.   Rivaroxaban 15 MG Tabs tablet Commonly known as: Xarelto TAKE 1 TABLET BY MOUTH  DAILY WITH SUPPER   Symbicort 160-4.5 MCG/ACT inhaler Generic drug: budesonide-formoterol INHALE 2 PUFFS BY MOUTH  INTO THE LUNGS TWO TIMES  DAILY   Ventolin HFA 108 (90 Base) MCG/ACT inhaler Generic drug: albuterol USE 2 PUFFS EVERY 6 HOURS AS NEEDED FOR SHORTNESS OF BREATH AND WHEEZING.   Vitamin D 125 MCG (5000 UT) Caps Take 1 capsule by mouth daily.        Allergies  Allergen Reactions   Brimonidine Tartrate-Timolol Other (See Comments)    REACTION: systemic malaise amigen eye drop   Penicillins Hives   Breo Ellipta [Fluticasone Furoate-Vilanterol] Other (See Comments)    Ran blood pressure up Increased blood pressure   Vancomycin Other (See Comments)    Red man syndrome - Mild redness and discomfort. Able to complete initial dose.        Procedures/Studies: CT Angio Chest PE W and/or Wo Contrast  Result Date: 04/11/2021 CLINICAL DATA:  Shortness of breath. EXAM: CT ANGIOGRAPHY CHEST WITH CONTRAST TECHNIQUE: Multidetector CT imaging of the chest was performed using the standard protocol during bolus administration of intravenous contrast. Multiplanar CT image reconstructions and MIPs were obtained to evaluate the vascular anatomy. CONTRAST:  57mL OMNIPAQUE IOHEXOL 350 MG/ML SOLN COMPARISON:  October 20, 2006. FINDINGS: Cardiovascular: Satisfactory opacification of the pulmonary arteries to the segmental level. No evidence of pulmonary embolism. Mild cardiomegaly is noted. Coronary calcifications are noted. No pericardial effusion. Atherosclerosis of thoracic aorta is noted without aneurysm formation. Mediastinum/Nodes: No enlarged mediastinal, hilar, or axillary lymph nodes. Thyroid gland, trachea, and esophagus demonstrate no significant findings. Lungs/Pleura: No pneumothorax is  noted. Small bilateral pleural effusions are noted with adjacent sub some atelectasis, left greater than right. Multiple patchy airspace opacities are noted consistent with multifocal pneumonia. Upper Abdomen: No acute abnormality. Musculoskeletal: No chest wall abnormality. No acute or significant osseous findings. Review of the MIP images confirms the above findings. IMPRESSION: No definite evidence of pulmonary embolus. Multiple patchy airspace opacities are noted bilaterally consistent with multifocal pneumonia. Small bilateral pleural effusions are noted with adjacent subsegmental atelectasis, left greater than right. Coronary calcifications are noted suggesting coronary disease. Aortic Atherosclerosis (ICD10-I70.0). Electronically Signed   By: Marijo Conception M.D.   On: 04/11/2021 12:54   DG CHEST PORT 1 VIEW  Result Date: 04/14/2021 CLINICAL DATA:  Shortness of breath. EXAM: PORTABLE CHEST 1 VIEW COMPARISON:  04/11/2021. FINDINGS: Mildly increased conspicuity of patchy consolidation in the right lung and similar dense left basilar opacity, better characterized on recent CT chest. No visible pneumothorax. Similar small left pleural effusion. Similar cardiomediastinal silhouette, mildly enlarged. Left subclavian approach cardiac rhythm maintenance device. Aortic atherosclerosis. IMPRESSION:  1. Mildly increased conspicuity of patchy consolidation in the right lung and similar dense left basilar opacity, better characterized on recent CT chest. 2. Similar small left pleural effusion. Electronically Signed   By: Margaretha Sheffield M.D.   On: 04/14/2021 10:07   DG Chest Port 1 View  Result Date: 04/11/2021 CLINICAL DATA:  Shortness of breath EXAM: PORTABLE CHEST 1 VIEW COMPARISON:  May 2022 FINDINGS: Ill-defined patchy opacities bilaterally. Probable Kerley B lines. Left lung base pleuroparenchymal opacity. Stable cardiomediastinal contours with mild cardiomegaly. No pneumothorax. IMPRESSION: Ill-defined  patchy opacities bilaterally and left basilar opacification. Favor edema given probable Kerley B-lines. Possible small left pleural effusion. Atypical/viral pneumonia is also a consideration. Electronically Signed   By: Macy Mis M.D.   On: 04/11/2021 09:31   ECHOCARDIOGRAM COMPLETE  Result Date: 04/12/2021    ECHOCARDIOGRAM REPORT   Patient Name:   GERALDYN SHAIN Date of Exam: 04/12/2021 Medical Rec #:  [id number]      Height:       63.0 in Accession #:    [id number]     Weight:       129.2 lb Date of Birth:  [birth year]      BSA:          1.606 m Patient Age:    24 years       BP:           179/76 mmHg Patient Gender: F              HR:           60 bpm. Exam Location:  Inpatient Procedure: 2D Echo, 3D Echo, Cardiac Doppler and Color Doppler Indications:    I50.40* Unspecified combined systolic (congestive) and diastolic                 (congestive) heart failure  History:        Patient has prior history of Echocardiogram examinations, most                 recent 01/15/2019. Abnormal ECG and Pacemaker, COPD and TIA,                 Aortic Valve Disease, Arrythmias:Atrial Fibrillation,                 Signs/Symptoms:Syncope, Shortness of Breath and Dyspnea; Risk                 Factors:Hypertension. Aortic stenosis.  Sonographer:    Roseanna Rainbow RDCS Referring Phys: [place]  Sonographer Comments: Technically difficult study due to poor echo windows. Very low parasternal window. Patient in high fowler's position. IMPRESSIONS  1. Left ventricular ejection fraction, by estimation, is 65 to 70%. The left ventricle has normal function. There is moderate left ventricular hypertrophy. Left ventricular diastolic parameters are indeterminate.  2. Right ventricular systolic function is low normal. The right ventricular size is mildly enlarged. There is severely elevated pulmonary artery systolic pressure.  3. Left atrial size was mildly dilated.  4. Right atrial size was severely dilated.  5. Mild mitral  valve regurgitation.  6. Tricuspid valve regurgitation is severe.  7. AV is thickened, calcified with restricted motion Peak and mean gradients through the valve are 52 and 27 mm Hg respectively. Dimensionless index is 0.51. Overall consistent with mild to moderate MR. Compared to echo from October 2020, mean gradient is increased (17 to 27 mm Hg) . The aortic valve is abnormal. Aortic valve regurgitation is  mild.  8. The inferior vena cava is normal in size with <50% respiratory variability, suggesting right atrial pressure of 8 mmHg. FINDINGS  Left Ventricle: Left ventricular ejection fraction, by estimation, is 65 to 70%. The left ventricle has normal function. The left ventricular internal cavity size was normal in size. There is moderate left ventricular hypertrophy. Left ventricular diastolic parameters are indeterminate. Right Ventricle: The right ventricular size is mildly enlarged. Right vetricular wall thickness was not assessed. Right ventricular systolic function is low normal. There is severely elevated pulmonary artery systolic pressure. The tricuspid regurgitant velocity is 3.48 m/s, and with an assumed right atrial pressure of 15 mmHg, the estimated right ventricular systolic pressure is 16.6 mmHg. Left Atrium: Left atrial size was mildly dilated. Right Atrium: Right atrial size was severely dilated. Pericardium: Trivial pericardial effusion is present. Mitral Valve: There is mild thickening of the mitral valve leaflet(s). Mild mitral annular calcification. Mild mitral valve regurgitation. MV peak gradient, 14.6 mmHg. The mean mitral valve gradient is 4.0 mmHg. Tricuspid Valve: The tricuspid valve is normal in structure. Tricuspid valve regurgitation is severe. Aortic Valve: AV is thickened, calcified with restricted motion Peak and mean gradients through the valve are 52 and 27 mm Hg respectively. Dimensionless index is 0.51. Overall consistent with mild to moderate MR. Compared to echo from October  2020, mean  gradient is increased (17 to 27 mm Hg). The aortic valve is abnormal. Aortic valve regurgitation is mild. Aortic valve mean gradient measures 27.2 mmHg. Aortic valve peak gradient measures 52.1 mmHg. Aortic valve area, by VTI measures 1.77 cm. Pulmonic Valve: The pulmonic valve was normal in structure. Pulmonic valve regurgitation is trivial. Aorta: The aortic root and ascending aorta are structurally normal, with no evidence of dilitation. Venous: The inferior vena cava is normal in size with less than 50% respiratory variability, suggesting right atrial pressure of 8 mmHg. IAS/Shunts: The interatrial septum was not assessed. Additional Comments: A device lead is visualized.  LEFT VENTRICLE PLAX 2D LVIDd:         3.80 cm LVIDs:         1.80 cm LV PW:         1.60 cm LV IVS:        1.30 cm LVOT diam:     2.10 cm     3D Volume EF: LV SV:         129         3D EF:        63 % LV SV Index:   80          LV EDV:       91 ml LVOT Area:     3.46 cm    LV ESV:       34 ml                            LV SV:        58 ml  LV Volumes (MOD) LV vol d, MOD A2C: 49.5 ml LV vol d, MOD A4C: 44.7 ml LV vol s, MOD A2C: 21.9 ml LV vol s, MOD A4C: 10.4 ml LV SV MOD A2C:     27.6 ml LV SV MOD A4C:     44.7 ml LV SV MOD BP:      31.5 ml RIGHT VENTRICLE             IVC RV S prime:  12.40 cm/s  IVC diam: 1.70 cm TAPSE (M-mode): 2.1 cm LEFT ATRIUM             Index        RIGHT ATRIUM           Index LA diam:        4.90 cm 3.05 cm/m   RA Area:     31.60 cm LA Vol (A2C):   99.8 ml 62.15 ml/m  RA Volume:   106.00 ml 66.01 ml/m LA Vol (A4C):   58.9 ml 36.68 ml/m LA Biplane Vol: 79.1 ml 49.26 ml/m  AORTIC VALVE                     PULMONIC VALVE AV Area (Vmax):    2.04 cm      PR End Diast Vel: 1.79 msec AV Area (Vmean):   1.99 cm AV Area (VTI):     1.77 cm AV Vmax:           361.00 cm/s AV Vmean:          238.000 cm/s AV VTI:            0.731 m AV Peak Grad:      52.1 mmHg AV Mean Grad:      27.2 mmHg LVOT Vmax:          213.00 cm/s LVOT Vmean:        137.000 cm/s LVOT VTI:          0.373 m LVOT/AV VTI ratio: 0.51  AORTA Ao Root diam: 3.20 cm Ao Asc diam:  3.60 cm MITRAL VALVE                TRICUSPID VALVE MV Area (PHT): 3.40 cm     TR Peak grad:   48.4 mmHg MV Area VTI:   3.81 cm     TR Vmax:        348.00 cm/s MV Peak grad:  14.6 mmHg MV Mean grad:  4.0 mmHg     SHUNTS MV Vmax:       1.91 m/s     Systemic VTI:  0.37 m MV Vmean:      85.6 cm/s    Systemic Diam: 2.10 cm MV Decel Time: 223 msec MV E velocity: 132.00 cm/s Dorris Carnes MD Electronically signed by Dorris Carnes MD Signature Date/Time: 04/12/2021/11:55:13 AM    Final        Subjective: Patient is feeling better, her dyspnea has improved, positive intermittent cough. Continue to be very weak and deconditioned   Discharge Exam: Vitals:   04/18/21 0723 04/18/21 0813  BP:  138/69  Pulse:  61  Resp:  (!) 21  Temp:    SpO2: 96% 96%   Vitals:   04/18/21 0325 04/18/21 0508 04/18/21 0723 04/18/21 0813  BP: 129/73 (!) 163/75  138/69  Pulse: 72   61  Resp: 19   (!) 21  Temp: 97.9 F (36.6 C)     TempSrc: Oral     SpO2: 95%  96% 96%  Weight: 57.5 kg     Height:        General: Not in pain or dyspnea, deconditioned  Neurology: Awake and alert, non focal  E ENT: no pallor, no icterus, oral mucosa moist Cardiovascular: No JVD. S1-S2 present, rhythmic, apical positive 3/6 systolic murmur at the left sternal border. No lower extremity edema. Pulmonary: positive breath sounds bilaterally, with no wheezing, rhonchi or rales. Gastrointestinal. Abdomen  soft and non tender Skin. No rashes Musculoskeletal: no joint deformities   The results of significant diagnostics from this hospitalization (including imaging, microbiology, ancillary and laboratory) are listed below for reference.     Microbiology: Recent Results (from the past 240 hour(s))  SARS-COV-2 RNA,(COVID-19) QUAL NAAT     Status: None   Collection Time: 04/10/21  2:44 PM  Result Value  Ref Range Status   SARS CoV2 RNA NOT DETECTED NOT DETECTED Final    Comment: . A Not Detected result means that SARS-CoV-2 RNA was not present in the specimen above the limit of detection. . A Not Detected result does not rule out the possibility of COVID-19 and should not be used as the sole basis for treatment or patient management decisions. If COVID-19 is still suspected, based on exposure history together with other clinical findings, re-testing should be  considered in the context of clinical observations and epidemiological data for patient management decisions. . Test Method: Nucleic Acid Amplification Test including reverse transcription polymerase chain reaction (RT-PCR) and transcription mediated amplification (TMA). The test method meets the [place] Centers for Disease Control and prevention (CDC) pre departure and arrival requirement for viral test for COVID-19 dated May 06, 2019. Testing requirements for traveling may change with time. The patient is responsible for determining the test requirements for each nation while they ar e traveling. . This test has been authorized by the FDA under an  Emergency Use Authorization (EUA) for use by authorized laboratories. . Please review the "Fact Sheets" and FDA authorized labeling available for health care providers and patients using the following websites: https://www.[email or web address]/home/[email or web address]/fact-sheet2  [email or web address]://[email or web address]/home/Covid-19/[email or web address]/ fact-sheet2 . Due to the current public health emergency, [facility] is accepting samples from appropriate clinical sources collected using wide variety of swabs and transport media for COVID-19. Not detected test results derived from specimens received in non- commercially manufactured viral collection kits or those not yet authorized by FDA for COVID-19 testing should be cautiously evaluated and take extra precautions such  as additional clinical monitoring, including collection of an additional specimen. . Additional information about COVID-19 can be fo und at the [employer] website: www.[facility].com/Covid19. . For patients with a Detected or Inconclusive test result, please see CDC's COVID-19 Treatments and  Medications page located at  BroadcastLocal.cz- severe-illness.html for information on COVID-19 therapeutics. . For patients with a Not Detected test result, please see CDC's Vaccines for COVID-19 page located at PhoneStatistics.is for information on COVID-19 vaccines.   Resp Panel by RT-PCR (Flu A&B, Covid) Nasopharyngeal Swab     Status: None   Collection Time: 04/11/21  9:13 AM   Specimen: Nasopharyngeal Swab; Nasopharyngeal(NP) swabs in vial transport medium  Result Value Ref Range Status   SARS Coronavirus 2 by RT PCR NEGATIVE NEGATIVE Final    Comment: (NOTE) SARS-CoV-2 target nucleic acids are NOT DETECTED.  The SARS-CoV-2 RNA is generally detectable in upper respiratory specimens during the acute phase of infection. The lowest concentration of SARS-CoV-2 viral copies this assay can detect is 138 copies/mL. A negative result does not preclude SARS-Cov-2 infection and should not be used as the sole basis for treatment or other patient management decisions. A negative result may occur with  improper specimen collection/handling, submission of specimen other than nasopharyngeal swab, presence of viral mutation(s) within the areas targeted by this assay, and inadequate number of viral copies(<138 copies/mL). A negative result must be combined with clinical observations, patient history, and epidemiological information. The expected result is Negative.  Fact Sheet for  Patients:  EntrepreneurPulse.com.au  Fact Sheet for Healthcare Providers:   IncredibleEmployment.be  This test is no t yet approved or cleared by the [place] FDA and  has been authorized for detection and/or diagnosis of SARS-CoV-2 by FDA under an Emergency Use Authorization (EUA). This EUA will remain  in effect (meaning this test can be used) for the duration of the COVID-19 declaration under Section 564(b)(1) of the Act, 21 U.S.C.section 360bbb-3(b)(1), unless the authorization is terminated  or revoked sooner.       Influenza A by PCR NEGATIVE NEGATIVE Final   Influenza B by PCR NEGATIVE NEGATIVE Final    Comment: (NOTE) The Xpert Xpress SARS-CoV-2/FLU/RSV plus assay is intended as an aid in the diagnosis of influenza from Nasopharyngeal swab specimens and should not be used as a sole basis for treatment. Nasal washings and aspirates are unacceptable for Xpert Xpress SARS-CoV-2/FLU/RSV testing.  Fact Sheet for Patients: EntrepreneurPulse.com.au  Fact Sheet for Healthcare Providers: IncredibleEmployment.be  This test is not yet approved or cleared by the [place] FDA and has been authorized for detection and/or diagnosis of SARS-CoV-2 by FDA under an Emergency Use Authorization (EUA). This EUA will remain in effect (meaning this test can be used) for the duration of the COVID-19 declaration under Section 564(b)(1) of the Act, 21 U.S.C. section 360bbb-3(b)(1), unless the authorization is terminated or revoked.  Performed at [facility], [address] [place], [place] [address]   Blood Culture (routine x 2)     Status: None   Collection Time: 04/11/21  9:50 AM   Specimen: Right Antecubital; Blood  Result Value Ref Range Status   Specimen Description   Final    RIGHT ANTECUBITAL Performed at [facility], [address], [place], [place] [address]    Special Requests   Final    BOTTLES DRAWN AEROBIC AND ANAEROBIC Blood Culture results  may not be optimal due to an excessive volume of blood received in culture bottles Performed at [facility], [address] [place], [place] [address]    Culture   Final    NO GROWTH 5 DAYS Performed at [facility], [place] [address], [place], [place] [address]    Report Status 04/16/2021 FINAL  Final  Urine Culture     Status: Abnormal   Collection Time: 04/11/21  9:50 AM   Specimen: In/Out Cath Urine  Result Value Ref Range Status   Specimen Description   Final    IN/OUT CATH URINE Performed at [facility], [address], [place], [place] [address]    Special Requests   Final    NONE Performed at [facility], [address] [place], [place] [address]    Culture 80,000 COLONIES/mL ENTEROBACTER AEROGENES (A)  Final   Report Status 04/13/2021 FINAL  Final   Organism ID, Bacteria ENTEROBACTER AEROGENES (A)  Final      Susceptibility   Enterobacter aerogenes - MIC*    CEFAZOLIN RESISTANT Resistant     CEFEPIME <=0.12 SENSITIVE Sensitive     CEFTRIAXONE <=0.25 SENSITIVE Sensitive     CIPROFLOXACIN <=0.25 SENSITIVE Sensitive     GENTAMICIN <=1 SENSITIVE Sensitive     IMIPENEM 0.5 SENSITIVE Sensitive     NITROFURANTOIN 64 INTERMEDIATE Intermediate     TRIMETH/SULFA <=20 SENSITIVE Sensitive     PIP/TAZO <=4 SENSITIVE Sensitive     * 80,000 COLONIES/mL ENTEROBACTER AEROGENES  Culture, blood (Routine X 2) w Reflex to ID Panel  Status: None   Collection Time: 04/11/21 10:10 AM   Specimen: BLOOD LEFT FOREARM  Result Value Ref Range Status   Specimen Description   Final    BLOOD LEFT FOREARM Performed at [facility], [address], [place], [place] [address]    Special Requests   Final    BOTTLES DRAWN AEROBIC AND ANAEROBIC Blood Culture results may not be optimal due to an excessive volume of blood received in culture bottles Performed at [facility], [address], [place], [place] [address]    Culture   Final    NO GROWTH 5 DAYS Performed at [facility] [address] [place], [place] [address]    Report Status 04/16/2021 FINAL  Final  MRSA Next Gen by PCR, Nasal     Status: None   Collection Time: 04/12/21  4:14 AM   Specimen: Nasal Mucosa; Nasal Swab  Result Value Ref Range Status   MRSA by PCR Next Gen NOT DETECTED NOT DETECTED Final    Comment: (NOTE) The GeneXpert MRSA Assay (FDA approved for NASAL specimens only), is one component of a comprehensive MRSA colonization surveillance program. It is not intended to diagnose MRSA infection nor to guide or monitor treatment for MRSA infections. Test performance is not FDA approved in patients less than 16 years old. Performed at [facility], [place] [address], [place], [place] [address]   SARS CORONAVIRUS 2 (TAT 6-24 HRS) Nasopharyngeal Nasopharyngeal Swab     Status: None   Collection Time: 04/17/21  2:13 PM   Specimen: Nasopharyngeal Swab  Result Value Ref Range Status   SARS Coronavirus 2 NEGATIVE NEGATIVE Final    Comment: (NOTE) SARS-CoV-2 target nucleic acids are NOT DETECTED.  The SARS-CoV-2 RNA is generally detectable in upper and lower respiratory specimens during the acute phase of infection. Negative results do not preclude SARS-CoV-2 infection, do not rule out co-infections with other pathogens, and should not be used as the sole basis for treatment or other patient management decisions. Negative results must be combined with clinical observations, patient history, and epidemiological information. The expected result is Negative.  Fact Sheet for Patients: SugarRoll.be  Fact Sheet for Healthcare Providers: https://www.woods-mathews.com/  This test is not yet approved or cleared by the [place] FDA and  has been authorized for detection and/or diagnosis of SARS-CoV-2 by FDA under an Emergency Use Authorization  (EUA). This EUA will remain  in effect (meaning this test can be used) for the duration of the COVID-19 declaration under Se ction 564(b)(1) of the Act, 21 U.S.C. section 360bbb-3(b)(1), unless the authorization is terminated or revoked sooner.  Performed at [facility], [place] [address] [place], [place] [address]      Labs: BNP (last 3 results) Recent Labs    08/30/20 1040 04/11/21 0950  BNP 391.7* 830.9*   Basic Metabolic Panel: Recent Labs  Lab 04/12/21 0426 04/13/21 0401 04/14/21 0357 04/17/21 0301 04/18/21 0354  NA 137 136 138 139 141  K 3.0* 3.2* 3.9 3.4* 3.8  CL 99 102 105 108 107  CO2 26 24 26 22 25   GLUCOSE 107* 130* 154* 97 83  BUN 16 20 19  24* 30*  CREATININE 1.03* 0.80 0.76 0.84 1.04*  CALCIUM 8.9 8.9 9.5 8.8* 9.1  MG 2.1  --  2.2  --  2.5*   Liver Function Tests: Recent Labs  Lab 04/11/21 0950  AST 30  ALT 37  ALKPHOS 99  BILITOT 1.0  PROT 6.8  ALBUMIN 3.6   No results for input(s): LIPASE, AMYLASE in the last 168 hours. No results for input(s): AMMONIA in the last 168 hours. CBC: Recent Labs  Lab 04/11/21 0950 04/12/21 0426 04/13/21 0401 04/17/21 0301  WBC 11.7* 13.4* 11.8* 11.5*  NEUTROABS 9.7* 10.7*  --   --   HGB 11.7* 11.5* 11.1* 12.5  HCT 36.7 36.0 35.0* 38.9  MCV 98.4 98.4 99.2 97.5  PLT 271 308 306 407*   Cardiac Enzymes: No results for input(s): CKTOTAL, CKMB, CKMBINDEX, TROPONINI in the last 168 hours. BNP: Invalid input(s): POCBNP CBG: No results for input(s): GLUCAP in the last 168 hours. D-Dimer No results for input(s): DDIMER in the last 72 hours. Hgb A1c No results for input(s): HGBA1C in the last 72 hours. Lipid Profile No results for input(s): CHOL, HDL, LDLCALC, TRIG, CHOLHDL, LDLDIRECT in the last 72 hours. Thyroid function studies No results for input(s): TSH, T4TOTAL, T3FREE, THYROIDAB in the last 72 hours.  Invalid input(s): FREET3 Anemia work up No results for input(s): VITAMINB12, FOLATE,  FERRITIN, TIBC, IRON, RETICCTPCT in the last 72 hours. Urinalysis    Component Value Date/Time   COLORURINE YELLOW 04/11/2021 0950   APPEARANCEUR CLEAR 04/11/2021 0950   LABSPEC 1.006 04/11/2021 0950   PHURINE 7.0 04/11/2021 0950   GLUCOSEU NEGATIVE 04/11/2021 0950   HGBUR NEGATIVE 04/11/2021 0950   BILIRUBINUR NEGATIVE 04/11/2021 0950   KETONESUR NEGATIVE 04/11/2021 0950   PROTEINUR NEGATIVE 04/11/2021 0950   UROBILINOGEN 0.2 01/26/2015 0325   NITRITE POSITIVE (A) 04/11/2021 0950   LEUKOCYTESUR TRACE (A) 04/11/2021 0950   Sepsis Labs Invalid input(s): PROCALCITONIN,  WBC,  LACTICIDVEN Microbiology Recent Results (from the past 240 hour(s))  SARS-COV-2 RNA,(COVID-19) QUAL NAAT     Status: None   Collection Time: 04/10/21  2:44 PM  Result Value Ref Range Status   SARS CoV2 RNA NOT DETECTED NOT DETECTED Final    Comment: . A Not Detected result means that SARS-CoV-2 RNA was not present in the specimen above the limit of detection. . A Not Detected result does not rule out the possibility of COVID-19 and should not be used as the sole basis for treatment or patient management decisions. If COVID-19 is still suspected, based on exposure history together with other clinical findings, re-testing should be  considered in the context of clinical observations and epidemiological data for patient management decisions. . Test Method: Nucleic Acid Amplification Test including reverse transcription polymerase chain reaction (RT-PCR) and transcription mediated amplification (TMA). The test method meets the [place] Centers for Disease Control and prevention (CDC) pre departure and arrival requirement for viral test for COVID-19 dated May 06, 2019. Testing requirements for traveling may change with time. The patient is responsible for determining the test requirements for each nation while they ar e traveling. . This test has been authorized by the FDA under an  Emergency Use  Authorization (EUA) for use by authorized laboratories. . Please review the "Fact Sheets" and FDA authorized labeling available for health care providers and patients using the following websites: https://www.[facility].com/home/Covid-19/HCP/NAAT/fact-sheet2  https://www.[facility].com/home/Covid-19/Patients/NAAT/ fact-sheet2 . Due to the current public health emergency, [facility] is accepting samples from appropriate clinical sources collected using wide variety of swabs and transport media for COVID-19. Not detected test results derived from specimens received in non- commercially manufactured viral collection kits or those not yet authorized by FDA for COVID-19 testing should be cautiously evaluated and take extra precautions such as additional clinical monitoring, including collection of an additional  specimen. . Additional information about COVID-19 can be fo und at the [employer] website: www.[facility].com/Covid19. . For patients with a Detected or Inconclusive test result, please see CDC's COVID-19 Treatments and  Medications page located at  BroadcastLocal.cz- severe-illness.html for information on COVID-19 therapeutics. . For patients with a Not Detected test result, please see CDC's Vaccines for COVID-19 page located at PhoneStatistics.is for information on COVID-19 vaccines.   Resp Panel by RT-PCR (Flu A&B, Covid) Nasopharyngeal Swab     Status: None   Collection Time: 04/11/21  9:13 AM   Specimen: Nasopharyngeal Swab; Nasopharyngeal(NP) swabs in vial transport medium  Result Value Ref Range Status   SARS Coronavirus 2 by RT PCR NEGATIVE NEGATIVE Final    Comment: (NOTE) SARS-CoV-2 target nucleic acids are NOT DETECTED.  The SARS-CoV-2 RNA is generally detectable in upper respiratory specimens during the acute phase of infection. The  lowest concentration of SARS-CoV-2 viral copies this assay can detect is 138 copies/mL. A negative result does not preclude SARS-Cov-2 infection and should not be used as the sole basis for treatment or other patient management decisions. A negative result may occur with  improper specimen collection/handling, submission of specimen other than nasopharyngeal swab, presence of viral mutation(s) within the areas targeted by this assay, and inadequate number of viral copies(<138 copies/mL). A negative result must be combined with clinical observations, patient history, and epidemiological information. The expected result is Negative.  Fact Sheet for Patients:  EntrepreneurPulse.com.au  Fact Sheet for Healthcare Providers:  IncredibleEmployment.be  This test is no t yet approved or cleared by the [place] FDA and  has been authorized for detection and/or diagnosis of SARS-CoV-2 by FDA under an Emergency Use Authorization (EUA). This EUA will remain  in effect (meaning this test can be used) for the duration of the COVID-19 declaration under Section 564(b)(1) of the Act, 21 U.S.C.section 360bbb-3(b)(1), unless the authorization is terminated  or revoked sooner.       Influenza A by PCR NEGATIVE NEGATIVE Final   Influenza B by PCR NEGATIVE NEGATIVE Final    Comment: (NOTE) The Xpert Xpress SARS-CoV-2/FLU/RSV plus assay is intended as an aid in the diagnosis of influenza from Nasopharyngeal swab specimens and should not be used as a sole basis for treatment. Nasal washings and aspirates are unacceptable for Xpert Xpress SARS-CoV-2/FLU/RSV testing.  Fact Sheet for Patients: EntrepreneurPulse.com.au  Fact Sheet for Healthcare Providers: IncredibleEmployment.be  This test is not yet approved or cleared by the [place] FDA and has been authorized for detection and/or diagnosis of SARS-CoV-2 by FDA under  an Emergency Use Authorization (EUA). This EUA will remain in effect (meaning this test can be used) for the duration of the COVID-19 declaration under Section 564(b)(1) of the Act, 21 U.S.C. section 360bbb-3(b)(1), unless the authorization is terminated or revoked.  Performed at [facility], [address] [place], [place] [address]   Blood Culture (routine x 2)     Status: None   Collection Time: 04/11/21  9:50 AM   Specimen: Right Antecubital; Blood  Result Value Ref Range Status   Specimen Description   Final    RIGHT ANTECUBITAL Performed at [facility], [address] [place], [place] [address]    Special Requests   Final    BOTTLES DRAWN AEROBIC AND ANAEROBIC Blood Culture results may not be optimal due to an excessive volume of blood received in culture bottles Performed at [facility], [address], [place], [place] [address]  Culture   Final    NO GROWTH 5 DAYS Performed at [facility], [place] [address], [place], [place] [address]    Report Status 04/16/2021 FINAL  Final  Urine Culture     Status: Abnormal   Collection Time: 04/11/21  9:50 AM   Specimen: In/Out Cath Urine  Result Value Ref Range Status   Specimen Description   Final    IN/OUT CATH URINE Performed at [facility], [address] [place], [place] [address]    Special Requests   Final    NONE Performed at [facility], [address], [place], [place] [address]    Culture 80,000 COLONIES/mL ENTEROBACTER AEROGENES (A)  Final   Report Status 04/13/2021 FINAL  Final   Organism ID, Bacteria ENTEROBACTER AEROGENES (A)  Final      Susceptibility   Enterobacter aerogenes - MIC*    CEFAZOLIN RESISTANT Resistant     CEFEPIME <=0.12 SENSITIVE Sensitive     CEFTRIAXONE <=0.25 SENSITIVE Sensitive     CIPROFLOXACIN <=0.25 SENSITIVE Sensitive     GENTAMICIN <=1 SENSITIVE Sensitive     IMIPENEM  0.5 SENSITIVE Sensitive     NITROFURANTOIN 64 INTERMEDIATE Intermediate     TRIMETH/SULFA <=20 SENSITIVE Sensitive     PIP/TAZO <=4 SENSITIVE Sensitive     * 80,000 COLONIES/mL ENTEROBACTER AEROGENES  Culture, blood (Routine X 2) w Reflex to ID Panel     Status: None   Collection Time: 04/11/21 10:10 AM   Specimen: BLOOD LEFT FOREARM  Result Value Ref Range Status   Specimen Description   Final    BLOOD LEFT FOREARM Performed at [facility], [address], [place], [place] [address]    Special Requests   Final    BOTTLES DRAWN AEROBIC AND ANAEROBIC Blood Culture results may not be optimal due to an excessive volume of blood received in culture bottles Performed at [facility], [address], [place], [place] [address]    Culture   Final    NO GROWTH 5 DAYS Performed at [facility], [place] [address] [place], [place] [address]    Report Status 04/16/2021 FINAL  Final  MRSA Next Gen by PCR, Nasal     Status: None   Collection Time: 04/12/21  4:14 AM   Specimen: Nasal Mucosa; Nasal Swab  Result Value Ref Range Status   MRSA by PCR Next Gen NOT DETECTED NOT DETECTED Final    Comment: (NOTE) The GeneXpert MRSA Assay (FDA approved for NASAL specimens only), is one component of a comprehensive MRSA colonization surveillance program. It is not intended to diagnose MRSA infection nor to guide or monitor treatment for MRSA infections. Test performance is not FDA approved in patients less than 43 years old. Performed at [facility], [place] [address], [place], [place] [address]   SARS CORONAVIRUS 2 (TAT 6-24 HRS) Nasopharyngeal Nasopharyngeal Swab     Status: None   Collection Time: 04/17/21  2:13 PM   Specimen: Nasopharyngeal Swab  Result Value Ref Range Status   SARS Coronavirus 2 NEGATIVE NEGATIVE Final    Comment: (NOTE) SARS-CoV-2 target nucleic acids are NOT DETECTED.  The SARS-CoV-2 RNA is generally detectable in upper and  lower respiratory specimens during the acute phase of infection. Negative results do not preclude SARS-CoV-2 infection, do not rule out co-infections with other pathogens, and should not be used as the sole basis for treatment or other patient management decisions. Negative results must be  combined with clinical observations, patient history, and epidemiological information. The expected result is Negative.  Fact Sheet for Patients: SugarRoll.be  Fact Sheet for Healthcare Providers: https://www.woods-mathews.com/  This test is not yet approved or cleared by the [place] FDA and  has been authorized for detection and/or diagnosis of SARS-CoV-2 by FDA under an Emergency Use Authorization (EUA). This EUA will remain  in effect (meaning this test can be used) for the duration of the COVID-19 declaration under Se ction 564(b)(1) of the Act, 21 U.S.C. section 360bbb-3(b)(1), unless the authorization is terminated or revoked sooner.  Performed at [facility], [place] [address], [place], [place] [address]      Time coordinating discharge: 45 minutes  SIGNED:   Tawni Millers, MD  [facility] 04/18/2021, 8:30 AM

## 2021-04-18 NOTE — Care Management Important Message (Signed)
Important Message  Patient Details  Name: Jody Blake MRN: [id number] Date of Birth: 09/20/1925   Medicare Important Message Given:  Yes     Shelda Altes 04/18/2021, 9:48 AM

## 2021-04-18 NOTE — Plan of Care (Signed)
DISCHARGE NOTE HOME Jody Blake to be discharged [facility] per MD order.   Skin clean, dry and intact without evidence of skin break down, no evidence of skin tears noted. IV catheter discontinued intact. Site without signs and symptoms of complications. Dressing and pressure applied. Pt denies pain at the site currently. No complaints noted.  Patient free of lines, drains, and wounds.   An After Visit Summary (AVS) was printed and placed in discharge packet for facility. Patient escorted via wheelchair, and discharged via private auto.  Stephan Minister, RN

## 2021-04-18 NOTE — Consult Note (Signed)
° °  [facility] CM Inpatient Consult   04/18/2021  DYLAN RUOTOLO [birth year] [id number]  [place]  Accountable Care Organization [ACO] Patient: Medicare  Primary Care Provider:  [name], MD [facility]  Patient screened for length of stay hospitalization to assess for potential [facility] Management service needs for post hospital transition.  Review of patient's medical record reveals patient is from [place] and is for a skilled nursing facility level of care for post hospital transition.  Patient is currently for [facility].   Plan:  No follow up for community care management as TOC needs are to be met at the skilled nursing facility LOC assessed.    For questions contact:   Natividad Brood, RN BSN [facility] Liaison  [phone] business mobile phone Toll free office [phone]  Fax number: [phone] [email or web address]

## 2021-04-18 NOTE — Progress Notes (Signed)
Mobility Specialist Progress Note:   04/18/21 1206  Mobility  Activity Ambulated in room  Level of Assistance Minimal assist, patient does 75% or more  Assistive Device  (HHA)  Distance Ambulated (ft) 10 ft  Mobility Ambulated with assistance in room  Mobility Response Tolerated well  Mobility performed by Mobility specialist  Bed Position Chair  $Mobility charge 1 Mobility   Pt received in bed willing to participate in mobility. No complaints of pain and asymptomatic. Pt left in chair with call bell in reach and all needs met.   [facility] Public librarian Phone [phone] Secondary Phone [phone]

## 2021-04-19 ENCOUNTER — Non-Acute Institutional Stay (SKILLED_NURSING_FACILITY): Payer: Medicare Other | Admitting: Adult Health

## 2021-04-19 ENCOUNTER — Encounter: Payer: Self-pay | Admitting: Adult Health

## 2021-04-19 DIAGNOSIS — I5031 Acute diastolic (congestive) heart failure: Secondary | ICD-10-CM | POA: Diagnosis not present

## 2021-04-19 DIAGNOSIS — J441 Chronic obstructive pulmonary disease with (acute) exacerbation: Secondary | ICD-10-CM

## 2021-04-19 DIAGNOSIS — I10 Essential (primary) hypertension: Secondary | ICD-10-CM

## 2021-04-19 DIAGNOSIS — E876 Hypokalemia: Secondary | ICD-10-CM | POA: Diagnosis not present

## 2021-04-19 DIAGNOSIS — I4821 Permanent atrial fibrillation: Secondary | ICD-10-CM

## 2021-04-19 DIAGNOSIS — J9601 Acute respiratory failure with hypoxia: Secondary | ICD-10-CM | POA: Diagnosis not present

## 2021-04-19 DIAGNOSIS — J340 Abscess, furuncle and carbuncle of nose: Secondary | ICD-10-CM | POA: Diagnosis not present

## 2021-04-19 DIAGNOSIS — J3089 Other allergic rhinitis: Secondary | ICD-10-CM

## 2021-04-19 DIAGNOSIS — I5032 Chronic diastolic (congestive) heart failure: Secondary | ICD-10-CM

## 2021-04-19 DIAGNOSIS — R531 Weakness: Secondary | ICD-10-CM | POA: Diagnosis not present

## 2021-04-19 DIAGNOSIS — K5901 Slow transit constipation: Secondary | ICD-10-CM | POA: Diagnosis not present

## 2021-04-19 DIAGNOSIS — J189 Pneumonia, unspecified organism: Secondary | ICD-10-CM | POA: Diagnosis not present

## 2021-04-19 DIAGNOSIS — J302 Other seasonal allergic rhinitis: Secondary | ICD-10-CM

## 2021-04-19 DIAGNOSIS — E039 Hypothyroidism, unspecified: Secondary | ICD-10-CM

## 2021-04-19 NOTE — Progress Notes (Signed)
Location:  Occupational psychologist of Service:  SNF (31) Provider:  DNR  [name], MD  Patient Care Team: [name], MD as PCP - General (Internal Medicine) Jerline Pain, MD as PCP - Cardiology (Cardiology) Thompson Grayer, MD as Consulting Physician (Cardiology) Deneise Lever, MD as Consulting Physician (Pulmonary Disease) Rutherford Guys, MD as Consulting Physician (Ophthalmology) [name], MD as Consulting Physician (Gastroenterology) Gaynelle Arabian, MD as Consulting Physician (Orthopedic Surgery)  Extended Emergency Contact Information Primary Emergency Contact: [name] of [place] Phone: [phone] Mobile Phone: [phone] Relation: Daughter Secondary Emergency Contact: [name] of [place] Phone: [phone] Mobile Phone: [phone] Relation: Son  Code Status:  DNR Goals of care: Advanced Directive information Advanced Directives 04/11/2021  Does Patient Have a Medical Advance Directive? Yes  Type of Advance Directive Living will;Healthcare Power of Attorney  Does patient want to make changes to medical advance directive? No - Patient declined  Copy of [place] in Chart? Yes - validated most recent copy scanned in chart (See row information)  Would patient like information on creating a medical advance directive? -  Pre-existing out of facility DNR order (yellow form or pink MOST form) -     Chief Complaint  Patient presents with   Hospitalization Follow-up    HPI:  Pt is a 88 y.o. female seen today for a hospital f/u s/p admission from 04/11/21-04/18/21 for acute hypoxemic respiratory failure with pneumonia.  PMH significant for hearing loss, CHF, pacemaker, rhinitis, GERD, HTN, TIA, chronic bronchitis/COPD, glaucoma  and blinind   She came in with wheezing, sats in the 80s, and sob.  Covid swab was negative  Urine analysis with specific gravity 1,006  wbc 0-5  Chest radiograph with cardiomegaly, small left pleural effusion, increased lung markings bilaterally with patchy infiltrate at the right base, (personally reviewed).  CT chest negative for pulmonary embolism. Multiple patchy airspace opacities, bilaterally predominately at the right base. Small left pleural effusion.   She was treated with diuresis for fluid overload, broad spectrum antibiotics, antitussives, steroids, nebs and is now on prednisone 5 mg. She improved and was discharged on cefidinir. She transitioned to rehab and is here for therapy. She previously lived in [place] but required home care and nursing for med management.  BP was high during her hospitalization and she required hydralazine.  She has been on losartan 100 mg bid but our pharmacy indicates it should be changed as the max dose is 100. Lab Results  Component Value Date   TSH 0.317 (L) 04/12/2021   Pt dose of synthroid reduced to 75 K 3.8 on1/11/22. Did require supplementation.  Past Surgical History:  Procedure Laterality Date   APPENDECTOMY  ~ 1941   AV NODE ABLATION  05/10/2014   AV NODE ABLATION N/A 05/10/2014   Procedure: AV NODE ABLATION;  Surgeon: Thompson Grayer, MD;  Location: [facility] CATH LAB;  Service: Cardiovascular;  Laterality: N/A;   BUNIONECTOMY WITH HAMMERTOE RECONSTRUCTION Bilateral ~ [place]  06/27/99   Medtronic PM implanted by Dr [name]   CARDIOVERSION N/A 12/02/2013   Procedure: CARDIOVERSION;  Surgeon: [name], MD;  Location: [place];  Service: Cardiovascular;  Laterality: N/A;   CATARACT EXTRACTION W/ INTRAOCULAR LENS  IMPLANT, BILATERAL Bilateral    COLONOSCOPY WITH PROPOFOL N/A 04/22/2013   Procedure: COLONOSCOPY WITH PROPOFOL;  Surgeon: [name], MD;  Location: [facility] ENDOSCOPY;  Service: Endoscopy;  Laterality: N/A;   DILATION AND CURETTAGE OF UTERUS  X 2   "when I was going thru menopause"   ESOPHAGOGASTRODUODENOSCOPY (EGD) WITH PROPOFOL N/A 04/22/2013    Procedure: ESOPHAGOGASTRODUODENOSCOPY (EGD) WITH PROPOFOL;  Surgeon: [name], MD;  Location: [facility] ENDOSCOPY;  Service: Endoscopy;  Laterality: N/A;   HERNIA REPAIR     INCISIONAL HERNIA REPAIR     INSERT / REPLACE / REMOVE PACEMAKER  06/2007   "took out the old; put in new"   INSERT / REPLACE / REMOVE PACEMAKER  05/10/2014   MDT PPM generator change by Dr [name]   JOINT REPLACEMENT     PARTIAL NEPHRECTOMY Left 05/1974   stone disease   PERMANENT PACEMAKER GENERATOR CHANGE N/A 05/10/2014   Procedure: PERMANENT PACEMAKER GENERATOR CHANGE;  Surgeon: Thompson Grayer, MD;  Location: [facility] CATH LAB;  Service: Cardiovascular;  Laterality: N/A;   TONSILLECTOMY AND ADENOIDECTOMY  1930's   TOTAL KNEE ARTHROPLASTY Right 2001    Allergies  Allergen Reactions   Brimonidine Tartrate-Timolol Other (See Comments)    REACTION: systemic malaise amigen eye drop   Penicillins Hives   Breo Ellipta [Fluticasone Furoate-Vilanterol] Other (See Comments)    Ran blood pressure up Increased blood pressure   Vancomycin Other (See Comments)    Red man syndrome - Mild redness and discomfort. Able to complete initial dose.     Outpatient Encounter Medications as of 04/19/2021  Medication Sig   Azelastine HCl 137 MCG/SPRAY SOLN USE 1 TO 2 SPRAYS INTO BOTH NOSTRILS TWICE DAILY.   cefdinir (OMNICEF) 300 MG capsule Take 1 capsule (300 mg total) by mouth every 12 (twelve) hours for 2 days.   Cholecalciferol (VITAMIN D) 125 MCG (5000 UT) CAPS Take 1 capsule by mouth daily.   famotidine (PEPCID) 40 MG tablet Take 1 tablet (40 mg total) by mouth at bedtime.   fluticasone (FLONASE) 50 MCG/ACT nasal spray USE 2 SPRAYS EACH NOSTRIL ONCE A DAY AS NEEDED FOR ALLERGIES OR CONGESTION.   furosemide (LASIX) 20 MG tablet Take 20 mg by mouth 2 (two) times a week.   furosemide (LASIX) 40 MG tablet TAKE 1 AND 1/2 TABLETS (60MG ) BY MOUTH ONCE DAILY   gabapentin (NEURONTIN) 100 MG capsule Take 1 capsule (100 mg total) by mouth at bedtime.    guaiFENesin (ROBITUSSIN) 100 MG/5ML liquid Take 5 mLs by mouth every 6 (six) hours as needed for cough or to loosen phlegm.   latanoprost (XALATAN) 0.005 % ophthalmic solution Place 1 drop into both eyes at bedtime.   levothyroxine (SYNTHROID) 75 MCG tablet Take 1 tablet (75 mcg total) by mouth daily before breakfast.   loratadine (CLARITIN) 10 MG tablet Take 1 tablet (10 mg total) by mouth daily.   losartan (COZAAR) 100 MG tablet Take 100 mg by mouth 2 (two) times daily.   Lutein 20 MG CAPS Take 1 capsule (20 mg total) by mouth daily.   Magnesium 250 MG TABS Take 1 tablet by mouth daily.   metoprolol succinate (TOPROL XL) 50 MG 24 hr tablet Take 1 tablet (50 mg total) by mouth daily. Take with or immediately following a meal.   montelukast (SINGULAIR) 10 MG tablet Take 1 tablet (10 mg total) by mouth daily.   Multiple Vitamins-Minerals (PRESERVISION AREDS 2) CAPS Take 1 capsule by mouth in the morning and at bedtime.    ofloxacin (OCUFLOX) 0.3 % ophthalmic solution Place 1 drop into both eyes 4 (four) times daily.   potassium chloride (KLOR-CON M) 10 MEQ tablet Take 20  mEq by mouth daily.   predniSONE (DELTASONE) 5 MG tablet Take 1 tablet (5 mg total) by mouth daily with breakfast.   Rivaroxaban (XARELTO) 15 MG TABS tablet TAKE 1 TABLET BY MOUTH  DAILY WITH SUPPER   SYMBICORT 160-4.5 MCG/ACT inhaler INHALE 2 PUFFS BY MOUTH  INTO THE LUNGS TWO TIMES  DAILY   VENTOLIN HFA 108 (90 Base) MCG/ACT inhaler USE 2 PUFFS EVERY 6 HOURS AS NEEDED FOR SHORTNESS OF BREATH AND WHEEZING.   No facility-administered encounter medications on file as of 04/19/2021.    Review of Systems  Constitutional:  Positive for activity change. Negative for appetite change, chills, diaphoresis, fatigue, fever and unexpected weight change.  HENT:  Positive for congestion, hearing loss, postnasal drip and rhinorrhea. Negative for drooling, ear discharge, ear pain, facial swelling, mouth sores, sore throat and trouble  swallowing.        Nasal sore  Eyes:        Blind  Respiratory:  Positive for cough and shortness of breath. Negative for wheezing.   Cardiovascular:  Positive for leg swelling. Negative for chest pain and palpitations.  Gastrointestinal:  Positive for constipation. Negative for abdominal distention, abdominal pain and diarrhea.  Genitourinary:  Negative for difficulty urinating and dysuria.  Musculoskeletal:  Negative for arthralgias, back pain, gait problem, joint swelling and myalgias.  Neurological:  Negative for dizziness, tremors, seizures, syncope, facial asymmetry, speech difficulty, weakness, light-headedness, numbness and headaches.  Psychiatric/Behavioral:  Negative for agitation, behavioral problems and confusion.    Immunization History  Administered Date(s) Administered   Influenza Split 01/07/2011   Influenza Whole 01/16/2009, 01/23/2010   Influenza, High Dose Seasonal PF 01/06/2013, 01/15/2019, 02/04/2020   Influenza,inj,Quad PF,6+ Mos 02/17/2015, 01/30/2018   Influenza,inj,quad, With Preservative 01/06/2017   Influenza-Unspecified 01/06/2014, 02/01/2016, 02/04/2020, 01/26/2021   Moderna SARS-COV2 Booster Vaccination 08/18/2020   Moderna Sars-Covid-2 Vaccination 04/20/2019, 05/18/2019, 02/22/2020   Pneumococcal Conjugate-13 02/24/2019   Pneumococcal-Unspecified 11/23/2013   Tdap 02/24/2019   Zoster Recombinat (Shingrix) 03/13/2017, 05/20/2017   Pertinent  Health Maintenance Due  Topic Date Due   INFLUENZA VACCINE  Completed   DEXA SCAN  Discontinued   Fall Risk 04/15/2021 04/16/2021 04/16/2021 04/17/2021 04/17/2021  Falls in the past year? - - - - -  Was there an injury with Fall? - - - - -  Fall Risk Category Calculator - - - - -  Fall Risk Category - - - - -  Patient Fall Risk Level High fall risk High fall risk High fall risk High fall risk High fall risk  Patient at Risk for Falls Due to - - - - -  Fall risk Follow up - - - - -   Functional Status Survey:     Vitals:   04/19/21 1022  BP: 133/76  Pulse: 62  Resp: 15  Temp: 98.3 F (36.8 C)  SpO2: 97%  Weight: 130 lb 9.6 oz (59.2 kg)   Body mass index is 23.13 kg/m. Wt Readings from Last 3 Encounters:  04/19/21 130 lb 9.6 oz (59.2 kg)  04/18/21 126 lb 12.2 oz (57.5 kg)  04/10/21 130 lb 6.4 oz (59.1 kg)    Physical Exam Vitals and nursing note reviewed.  Constitutional:      General: She is not in acute distress.    Appearance: She is not diaphoretic.  HENT:     Head: Normocephalic and atraumatic.     Nose: Congestion present.     Comments: Left nare with ulcer 100% yellow base with surrounding  erythema    Mouth/Throat:     Mouth: Mucous membranes are moist.     Pharynx: Oropharyngeal exudate and posterior oropharyngeal erythema present.  Eyes:     Conjunctiva/sclera: Conjunctivae normal.     Pupils: Pupils are equal, round, and reactive to light.  Neck:     Vascular: No JVD.  Cardiovascular:     Rate and Rhythm: Normal rate and regular rhythm.     Heart sounds: Murmur heard.  Pulmonary:     Effort: Pulmonary effort is normal. No respiratory distress.     Breath sounds: Wheezing (scattered) present. No rales.  Abdominal:     General: Abdomen is flat. Bowel sounds are normal. There is no distension.     Palpations: Abdomen is soft.     Tenderness: There is no abdominal tenderness.  Musculoskeletal:     Cervical back: No rigidity or tenderness.     Comments: BLE edema trace  Lymphadenopathy:     Cervical: No cervical adenopathy.  Skin:    General: Skin is warm and dry.  Neurological:     Mental Status: She is alert and oriented to person, place, and time.  Psychiatric:        Mood and Affect: Mood normal.    Labs reviewed: Recent Labs    04/12/21 0426 04/13/21 0401 04/14/21 0357 04/17/21 0301 04/18/21 0354  NA 137   < > 138 139 141  K 3.0*   < > 3.9 3.4* 3.8  CL 99   < > 105 108 107  CO2 26   < > 26 22 25   GLUCOSE 107*   < > 154* 97 83  BUN 16   < > 19  24* 30*  CREATININE 1.03*   < > 0.76 0.84 1.04*  CALCIUM 8.9   < > 9.5 8.8* 9.1  MG 2.1  --  2.2  --  2.5*   < > = values in this interval not displayed.   Recent Labs    08/22/20 0000 12/21/20 0000 04/11/21 0950  AST 27 24 30   ALT 24 18 37  ALKPHOS 71 72 99  BILITOT  --   --  1.0  PROT  --   --  6.8  ALBUMIN 4.4 4.3 3.6   Recent Labs    08/30/20 1039 12/21/20 0000 04/11/21 0950 04/12/21 0426 04/13/21 0401 04/17/21 0301  WBC 9.3   < > 11.7* 13.4* 11.8* 11.5*  NEUTROABS 7.4  --  9.7* 10.7*  --   --   HGB 12.7   < > 11.7* 11.5* 11.1* 12.5  HCT 39.1   < > 36.7 36.0 35.0* 38.9  MCV 97.5  --  98.4 98.4 99.2 97.5  PLT 271   < > 271 308 306 407*   < > = values in this interval not displayed.   Lab Results  Component Value Date   TSH 0.317 (L) 04/12/2021   Lab Results  Component Value Date   HGBA1C 5.7 (H) 06/25/2017   Lab Results  Component Value Date   CHOL 138 11/04/2017   HDL 70 11/04/2017   LDLCALC 58 11/04/2017   TRIG 53 11/04/2017   CHOLHDL 3.1 06/25/2017    Significant Diagnostic Results in last 30 days:  CT Angio Chest PE W and/or Wo Contrast  Result Date: 04/11/2021 CLINICAL DATA:  Shortness of breath. EXAM: CT ANGIOGRAPHY CHEST WITH CONTRAST TECHNIQUE: Multidetector CT imaging of the chest was performed using the standard protocol during bolus administration of intravenous contrast.  Multiplanar CT image reconstructions and MIPs were obtained to evaluate the vascular anatomy. CONTRAST:  23mL OMNIPAQUE IOHEXOL 350 MG/ML SOLN COMPARISON:  October 20, 2006. FINDINGS: Cardiovascular: Satisfactory opacification of the pulmonary arteries to the segmental level. No evidence of pulmonary embolism. Mild cardiomegaly is noted. Coronary calcifications are noted. No pericardial effusion. Atherosclerosis of thoracic aorta is noted without aneurysm formation. Mediastinum/Nodes: No enlarged mediastinal, hilar, or axillary lymph nodes. Thyroid gland, trachea, and esophagus  demonstrate no significant findings. Lungs/Pleura: No pneumothorax is noted. Small bilateral pleural effusions are noted with adjacent sub some atelectasis, left greater than right. Multiple patchy airspace opacities are noted consistent with multifocal pneumonia. Upper Abdomen: No acute abnormality. Musculoskeletal: No chest wall abnormality. No acute or significant osseous findings. Review of the MIP images confirms the above findings. IMPRESSION: No definite evidence of pulmonary embolus. Multiple patchy airspace opacities are noted bilaterally consistent with multifocal pneumonia. Small bilateral pleural effusions are noted with adjacent subsegmental atelectasis, left greater than right. Coronary calcifications are noted suggesting coronary disease. Aortic Atherosclerosis (ICD10-I70.0). Electronically Signed   By: Marijo Conception M.D.   On: 04/11/2021 12:54   DG Chest Port 1 View  Result Date: 04/11/2021 CLINICAL DATA:  Shortness of breath EXAM: PORTABLE CHEST 1 VIEW COMPARISON:  May 2022 FINDINGS: Ill-defined patchy opacities bilaterally. Probable Kerley B lines. Left lung base pleuroparenchymal opacity. Stable cardiomediastinal contours with mild cardiomegaly. No pneumothorax. IMPRESSION: Ill-defined patchy opacities bilaterally and left basilar opacification. Favor edema given probable Kerley B-lines. Possible small left pleural effusion. Atypical/viral pneumonia is also a consideration. Electronically Signed   By: Macy Mis M.D.   On: 04/11/2021 09:31   ECHOCARDIOGRAM COMPLETE  Result Date: 04/12/2021    ECHOCARDIOGRAM REPORT   Patient Name:   MORAYO LEVEN Date of Exam: 04/12/2021 Medical Rec #:  [id number]      Height:       63.0 in Accession #:    [id number]     Weight:       129.2 lb Date of Birth:  09-Feb-1926      BSA:          1.606 m Patient Age:    30 years       BP:           179/76 mmHg Patient Gender: F              HR:           60 bpm. Exam Location:  Inpatient Procedure: 2D Echo, 3D  Echo, Cardiac Doppler and Color Doppler Indications:    I50.40* Unspecified combined systolic (congestive) and diastolic                 (congestive) heart failure  History:        Patient has prior history of Echocardiogram examinations, most                 recent 01/15/2019. Abnormal ECG and Pacemaker, COPD and TIA,                 Aortic Valve Disease, Arrythmias:Atrial Fibrillation,                 Signs/Symptoms:Syncope, Shortness of Breath and Dyspnea; Risk                 Factors:Hypertension. Aortic stenosis.  Sonographer:    Roseanna Rainbow RDCS Referring Phys: [place]  Sonographer Comments: Technically difficult study due to poor echo windows. Very low  parasternal window. Patient in high fowler's position. IMPRESSIONS  1. Left ventricular ejection fraction, by estimation, is 65 to 70%. The left ventricle has normal function. There is moderate left ventricular hypertrophy. Left ventricular diastolic parameters are indeterminate.  2. Right ventricular systolic function is low normal. The right ventricular size is mildly enlarged. There is severely elevated pulmonary artery systolic pressure.  3. Left atrial size was mildly dilated.  4. Right atrial size was severely dilated.  5. Mild mitral valve regurgitation.  6. Tricuspid valve regurgitation is severe.  7. AV is thickened, calcified with restricted motion Peak and mean gradients through the valve are 52 and 27 mm Hg respectively. Dimensionless index is 0.51. Overall consistent with mild to moderate MR. Compared to echo from October 2020, mean gradient is increased (17 to 27 mm Hg) . The aortic valve is abnormal. Aortic valve regurgitation is mild.  8. The inferior vena cava is normal in size with <50% respiratory variability, suggesting right atrial pressure of 8 mmHg. FINDINGS  Left Ventricle: Left ventricular ejection fraction, by estimation, is 65 to 70%. The left ventricle has normal function. The left ventricular internal cavity size was  normal in size. There is moderate left ventricular hypertrophy. Left ventricular diastolic parameters are indeterminate. Right Ventricle: The right ventricular size is mildly enlarged. Right vetricular wall thickness was not assessed. Right ventricular systolic function is low normal. There is severely elevated pulmonary artery systolic pressure. The tricuspid regurgitant velocity is 3.48 m/s, and with an assumed right atrial pressure of 15 mmHg, the estimated right ventricular systolic pressure is 44.8 mmHg. Left Atrium: Left atrial size was mildly dilated. Right Atrium: Right atrial size was severely dilated. Pericardium: Trivial pericardial effusion is present. Mitral Valve: There is mild thickening of the mitral valve leaflet(s). Mild mitral annular calcification. Mild mitral valve regurgitation. MV peak gradient, 14.6 mmHg. The mean mitral valve gradient is 4.0 mmHg. Tricuspid Valve: The tricuspid valve is normal in structure. Tricuspid valve regurgitation is severe. Aortic Valve: AV is thickened, calcified with restricted motion Peak and mean gradients through the valve are 52 and 27 mm Hg respectively. Dimensionless index is 0.51. Overall consistent with mild to moderate MR. Compared to echo from October 2020, mean  gradient is increased (17 to 27 mm Hg). The aortic valve is abnormal. Aortic valve regurgitation is mild. Aortic valve mean gradient measures 27.2 mmHg. Aortic valve peak gradient measures 52.1 mmHg. Aortic valve area, by VTI measures 1.77 cm. Pulmonic Valve: The pulmonic valve was normal in structure. Pulmonic valve regurgitation is trivial. Aorta: The aortic root and ascending aorta are structurally normal, with no evidence of dilitation. Venous: The inferior vena cava is normal in size with less than 50% respiratory variability, suggesting right atrial pressure of 8 mmHg. IAS/Shunts: The interatrial septum was not assessed. Additional Comments: A device lead is visualized.  LEFT VENTRICLE PLAX  2D LVIDd:         3.80 cm LVIDs:         1.80 cm LV PW:         1.60 cm LV IVS:        1.30 cm LVOT diam:     2.10 cm     3D Volume EF: LV SV:         129         3D EF:        63 % LV SV Index:   80          LV EDV:  91 ml LVOT Area:     3.46 cm    LV ESV:       34 ml                            LV SV:        58 ml  LV Volumes (MOD) LV vol d, MOD A2C: 49.5 ml LV vol d, MOD A4C: 44.7 ml LV vol s, MOD A2C: 21.9 ml LV vol s, MOD A4C: 10.4 ml LV SV MOD A2C:     27.6 ml LV SV MOD A4C:     44.7 ml LV SV MOD BP:      31.5 ml RIGHT VENTRICLE             IVC RV S prime:     12.40 cm/s  IVC diam: 1.70 cm TAPSE (M-mode): 2.1 cm LEFT ATRIUM             Index        RIGHT ATRIUM           Index LA diam:        4.90 cm 3.05 cm/m   RA Area:     31.60 cm LA Vol (A2C):   99.8 ml 62.15 ml/m  RA Volume:   106.00 ml 66.01 ml/m LA Vol (A4C):   58.9 ml 36.68 ml/m LA Biplane Vol: 79.1 ml 49.26 ml/m  AORTIC VALVE                     PULMONIC VALVE AV Area (Vmax):    2.04 cm      PR End Diast Vel: 1.79 msec AV Area (Vmean):   1.99 cm AV Area (VTI):     1.77 cm AV Vmax:           361.00 cm/s AV Vmean:          238.000 cm/s AV VTI:            0.731 m AV Peak Grad:      52.1 mmHg AV Mean Grad:      27.2 mmHg LVOT Vmax:         213.00 cm/s LVOT Vmean:        137.000 cm/s LVOT VTI:          0.373 m LVOT/AV VTI ratio: 0.51  AORTA Ao Root diam: 3.20 cm Ao Asc diam:  3.60 cm MITRAL VALVE                TRICUSPID VALVE MV Area (PHT): 3.40 cm     TR Peak grad:   48.4 mmHg MV Area VTI:   3.81 cm     TR Vmax:        348.00 cm/s MV Peak grad:  14.6 mmHg MV Mean grad:  4.0 mmHg     SHUNTS MV Vmax:       1.91 m/s     Systemic VTI:  0.37 m MV Vmean:      85.6 cm/s    Systemic Diam: 2.10 cm MV Decel Time: 223 msec MV E velocity: 132.00 cm/s Dorris Carnes MD Electronically signed by Dorris Carnes MD Signature Date/Time: 04/12/2021/11:55:13 AM    Final     Assessment/Plan  1. Acute hypoxemic respiratory failure (HCC) NO longer needing  oxygen  2. Community acquired pneumonia, bilateral Improving. Complete course of cefdinir.   3. Decompensated COPD with exacerbation (chronic obstructive pulmonary disease) ([place])  Improving but still having cough and sob with exertion Schedule Duonebs BID and q 6 prn Continue Symbicort.   4. Weakness PT and OT eval   5. Acute diastolic CHF (congestive heart failure) (HCC) EF 65-70%, bilateral dilated atria, LVH, Mild to mod MR, severely elevated pulmonary artery pressure, aortic regurg noted  Improving Continue scheduled diuretics, arb therapy Daily weights.  Low sodium diet Compression hose with elevation Could consider aldactone if needed.   6. Essential hypertension Controlled at this time Will reduce losartan to 100 per pharmacy recommended as this is the max dose. We can watch her bp here. At goal right now. I was concerned she may be getting some s/e from the losartan with cough/drainage.   7. Hypokalemia Supplemented On Kdur 20 daily Repeat labs monday   8. Acquired hypothyroidism Lab Results  Component Value Date   TSH 0.317 (L) 04/12/2021   Continue reduced dose synthroid. Will need tsh in 6 weeks  9. Slow transit constipation Give Dulcolax supp x 1 now, start senna s 1 qhs  10. Seasonal and perennial allergic rhinitis On claritin, singulair, and Azelastine Continues with drainage, chronic mucus in the back of her throat, and erythema. THis is an on going issue. Will discuss ENT referral with Dr Lyndel Safe.   11. Nasal ulcer Bactroban bid to left nare x 7 days  12. AFib Rate controlled on BB Has pacemaker also On xarelto   I discussed with her that if she moved to [place] we could provide more support and hopefully prevent further hospitalizations but she declined. She has caregivers with her 1:1 at home and med management. She will stay in rehab to receive therapy for now.   Family/ staff Communication: discussed with resident and nurse   Labs/tests ordered:    CBC BMP 1/16

## 2021-04-20 ENCOUNTER — Non-Acute Institutional Stay (SKILLED_NURSING_FACILITY): Payer: Medicare Other | Admitting: Internal Medicine

## 2021-04-20 DIAGNOSIS — J441 Chronic obstructive pulmonary disease with (acute) exacerbation: Secondary | ICD-10-CM | POA: Diagnosis not present

## 2021-04-20 DIAGNOSIS — J189 Pneumonia, unspecified organism: Secondary | ICD-10-CM

## 2021-04-20 DIAGNOSIS — I4821 Permanent atrial fibrillation: Secondary | ICD-10-CM | POA: Diagnosis not present

## 2021-04-20 DIAGNOSIS — I10 Essential (primary) hypertension: Secondary | ICD-10-CM

## 2021-04-20 DIAGNOSIS — I5031 Acute diastolic (congestive) heart failure: Secondary | ICD-10-CM

## 2021-04-20 DIAGNOSIS — R531 Weakness: Secondary | ICD-10-CM

## 2021-04-20 DIAGNOSIS — E039 Hypothyroidism, unspecified: Secondary | ICD-10-CM | POA: Diagnosis not present

## 2021-04-20 DIAGNOSIS — J9601 Acute respiratory failure with hypoxia: Secondary | ICD-10-CM

## 2021-04-21 NOTE — Progress Notes (Signed)
Location: Occupational psychologist of Service:  SNF (31)  Provider:   Code Status: DNR Goals of Care:  Advanced Directives 05/03/2021  Does Patient Have a Medical Advance Directive? Yes  Type of Paramedic of [place];Living will;Out of facility DNR (pink MOST or yellow form)  Does patient want to make changes to medical advance directive? No - Patient declined  Copy of [place] in Chart? -  Would patient like information on creating a medical advance directive? -  Pre-existing out of facility DNR order (yellow form or pink MOST form) Yellow form placed in chart (order not valid for inpatient use);Pink MOST form placed in chart (order not valid for inpatient use)     Chief Complaint  Patient presents with   New Admit To SNF    HPI: Patient is a 88 y.o. female seen today for an acute visit for New admit in SNF  Admitted in the hospital from 01/04-01/11 for Acute Hypoxic Respiratory Failure due to  Multi Lobar Pneumonia  Patient has h/o A. fib on Xarelto s/p AV nodal ablation, s/p PPM in 2009 Aortic stenosis  COPD with frequent exacerbations.  Not on oxygen.  On chronic prednisone.  Follows closely with Dr. [name].  Is enrolled in palliative care Chronic diastolic CHF on high doses of Lasix History of chronic venous insufficiency Vision loss due to macular degeneration and glaucoma  Went to ED for SOB. Xray showed Patchy infiltrate in an right base.  CT of the chest was done which was negative for PE but showed bilateral airspace opacity.  She was diagnosed with community-acquired pneumonia.  Was treated with good diuresis and broad-spectrum antibiotics and steroids .  She is now discharged back into SNF. Doing better today denies any shortness of breath is off her oxygen.  Still feels weak and has not started her therapy.  Also has a cough.  Most of the shortness of breath is on exertion. Past Medical History:   Diagnosis Date   Anemia    Aortic stenosis    a. Echo 09/06/12 EF 55-60%, no WMA, G2DD, Ao valve sclerosis w/ mod stenosis, LA mildly dilated, PA pressure 68mmHg   Asthmatic bronchitis    Complete heart block (HCC)    s/p permanent pacemaker 06/27/1999 (Battery change 06/2007 and 2016).  s/p AV nodal ablation by Dr [name] 2016.   COPD (chronic obstructive pulmonary disease) (HCC)    Dr. [name]   DDD (degenerative disc disease)    Diastolic dysfunction    a. Echo 09/06/12 EF 55-60%, no WMA, G2DD, Ao valve sclerosis w/ mod stenosis, LA mildly dilated, PA pressure 1mmHg   Diverticulosis    DVT (deep vein thrombosis) in pregnancy 1954   a. LLE   Hypertension    Hypothyroidism    on medication   Macular degeneration    Dr. [name]   Osteoarthrosis, unspecified whether generalized or localized, other specified sites    PAF (paroxysmal atrial fibrillation) ([place])    Stopped flecainide, on amiodarone but still has bouts of A FIB (mostly in mornings).    Pure hypercholesterolemia    Staghorn calculus    Left    Past Surgical History:  Procedure Laterality Date   APPENDECTOMY  ~ 1941   AV NODE ABLATION  05/10/2014   AV NODE ABLATION N/A 05/10/2014   Procedure: AV NODE ABLATION;  Surgeon: Thompson Grayer, MD;  Location: [facility] CATH LAB;  Service: Cardiovascular;  Laterality: N/A;   BUNIONECTOMY  WITH HAMMERTOE RECONSTRUCTION Bilateral ~ [place]  06/27/99   Medtronic PM implanted by Dr [name]   CARDIOVERSION N/A 12/02/2013   Procedure: CARDIOVERSION;  Surgeon: [name], MD;  Location: [place];  Service: Cardiovascular;  Laterality: N/A;   CATARACT EXTRACTION W/ INTRAOCULAR LENS  IMPLANT, BILATERAL Bilateral    COLONOSCOPY WITH PROPOFOL N/A 04/22/2013   Procedure: COLONOSCOPY WITH PROPOFOL;  Surgeon: [name], MD;  Location: [facility] ENDOSCOPY;  Service: Endoscopy;  Laterality: N/A;   DILATION AND CURETTAGE OF UTERUS  X 2   "when I was going thru menopause"    ESOPHAGOGASTRODUODENOSCOPY (EGD) WITH PROPOFOL N/A 04/22/2013   Procedure: ESOPHAGOGASTRODUODENOSCOPY (EGD) WITH PROPOFOL;  Surgeon: [name], MD;  Location: [facility] ENDOSCOPY;  Service: Endoscopy;  Laterality: N/A;   HERNIA REPAIR     INCISIONAL HERNIA REPAIR     INSERT / REPLACE / REMOVE PACEMAKER  06/2007   "took out the old; put in new"   INSERT / REPLACE / REMOVE PACEMAKER  05/10/2014   MDT PPM generator change by Dr [name]   JOINT REPLACEMENT     PARTIAL NEPHRECTOMY Left 05/1974   stone disease   PERMANENT PACEMAKER GENERATOR CHANGE N/A 05/10/2014   Procedure: PERMANENT PACEMAKER GENERATOR CHANGE;  Surgeon: Thompson Grayer, MD;  Location: [facility] [place] CATH LAB;  Service: Cardiovascular;  Laterality: N/A;   TONSILLECTOMY AND ADENOIDECTOMY  1930's   TOTAL KNEE ARTHROPLASTY Right 2001    Allergies  Allergen Reactions   Brimonidine Tartrate-Timolol Other (See Comments)    REACTION: systemic malaise amigen eye drop   Penicillins Hives   Breo Ellipta [Fluticasone Furoate-Vilanterol] Other (See Comments)    Ran blood pressure up Increased blood pressure   Vancomycin Other (See Comments)    Red man syndrome - Mild redness and discomfort. Able to complete initial dose.     Outpatient Encounter Medications as of 04/20/2021  Medication Sig   ipratropium-albuterol (DUONEB) 0.5-2.5 (3) MG/3ML SOLN Take 3 mLs by nebulization 2 (two) times daily. And every 8 hours as needed   Azelastine HCl 137 MCG/SPRAY SOLN USE 1 TO 2 SPRAYS INTO BOTH NOSTRILS TWICE DAILY.   [EXPIRED] cefdinir (OMNICEF) 300 MG capsule Take 1 capsule (300 mg total) by mouth every 12 (twelve) hours for 2 days.   Cholecalciferol (VITAMIN D) 125 MCG (5000 UT) CAPS Take 1 capsule by mouth daily.   famotidine (PEPCID) 40 MG tablet Take 1 tablet (40 mg total) by mouth at bedtime.   fluticasone (FLONASE) 50 MCG/ACT nasal spray USE 2 SPRAYS EACH NOSTRIL ONCE A DAY AS NEEDED FOR ALLERGIES OR CONGESTION.   furosemide (LASIX) 20 MG tablet Take 20 mg  by mouth 2 (two) times a week. Monday and Thursday, along with other dosing listed on medication list   furosemide (LASIX) 40 MG tablet TAKE 1 AND 1/2 TABLETS (60MG ) BY MOUTH ONCE DAILY   guaiFENesin (ROBITUSSIN) 100 MG/5ML liquid Take 5 mLs by mouth every 6 (six) hours as needed for cough or to loosen phlegm.   latanoprost (XALATAN) 0.005 % ophthalmic solution Place 1 drop into both eyes at bedtime.   levothyroxine (SYNTHROID) 75 MCG tablet Take 1 tablet (75 mcg total) by mouth daily before breakfast.   loratadine (CLARITIN) 10 MG tablet Take 1 tablet (10 mg total) by mouth daily.   losartan (COZAAR) 100 MG tablet Take 100 mg by mouth daily.   Lutein 20 MG CAPS Take 1 capsule (20 mg total) by mouth daily.   Magnesium 250  MG TABS Take 1 tablet by mouth daily.   metoprolol succinate (TOPROL XL) 50 MG 24 hr tablet Take 1 tablet (50 mg total) by mouth daily. Take with or immediately following a meal.   montelukast (SINGULAIR) 10 MG tablet Take 1 tablet (10 mg total) by mouth daily.   Multiple Vitamins-Minerals (PRESERVISION AREDS 2) CAPS Take 1 capsule by mouth in the morning and at bedtime.    potassium chloride (KLOR-CON M) 10 MEQ tablet Take 20 mEq by mouth daily.   predniSONE (DELTASONE) 5 MG tablet Take 1 tablet (5 mg total) by mouth daily with breakfast.   Rivaroxaban (XARELTO) 15 MG TABS tablet TAKE 1 TABLET BY MOUTH  DAILY WITH SUPPER   SYMBICORT 160-4.5 MCG/ACT inhaler INHALE 2 PUFFS BY MOUTH  INTO THE LUNGS TWO TIMES  DAILY   VENTOLIN HFA 108 (90 Base) MCG/ACT inhaler USE 2 PUFFS EVERY 6 HOURS AS NEEDED FOR SHORTNESS OF BREATH AND WHEEZING.   [DISCONTINUED] gabapentin (NEURONTIN) 100 MG capsule Take 1 capsule (100 mg total) by mouth at bedtime.   [DISCONTINUED] ofloxacin (OCUFLOX) 0.3 % ophthalmic solution Place 1 drop into both eyes 4 (four) times daily.   No facility-administered encounter medications on file as of 04/20/2021.    Review of Systems:  Review of Systems  Constitutional:   Positive for activity change. Negative for appetite change.  HENT: Negative.    Respiratory:  Positive for cough and shortness of breath.   Cardiovascular:  Positive for leg swelling.  Gastrointestinal:  Negative for constipation.  Genitourinary: Negative.   Musculoskeletal:  Negative for arthralgias, gait problem and myalgias.  Skin: Negative.   Neurological:  Positive for weakness. Negative for dizziness.  Psychiatric/Behavioral:  Negative for confusion, dysphoric mood and sleep disturbance.    Health Maintenance  Topic Date Due   COVID-19 Vaccine (4 - Booster for Moderna series) 10/13/2020   TETANUS/TDAP  02/23/2029   Pneumonia Vaccine 77+ Years old  Completed   INFLUENZA VACCINE  Completed   Zoster Vaccines- Shingrix  Completed   HPV VACCINES  Aged Out   DEXA SCAN  Discontinued    Physical Exam: Vitals:   04/20/21 1854  BP: (!) 103/53  Pulse: 60  Resp: 18  Temp: 98.7 F (37.1 C)  SpO2: 92%  Weight: 125 lb 6.4 oz (56.9 kg)   Body mass index is 22.21 kg/m. Physical Exam Vitals reviewed.  Constitutional:      Appearance: Normal appearance.  HENT:     Head: Normocephalic.     Nose: Nose normal.     Mouth/Throat:     Mouth: Mucous membranes are moist.     Pharynx: Oropharynx is clear.  Eyes:     Pupils: Pupils are equal, round, and reactive to light.  Cardiovascular:     Rate and Rhythm: Normal rate and regular rhythm.     Pulses: Normal pulses.     Heart sounds: Murmur heard.  Pulmonary:     Effort: Pulmonary effort is normal.     Breath sounds: Normal breath sounds. No wheezing.  Abdominal:     General: Abdomen is flat. Bowel sounds are normal.     Palpations: Abdomen is soft.  Musculoskeletal:     Cervical back: Neck supple.     Comments: Mild Swelling   Skin:    General: Skin is warm.  Neurological:     General: No focal deficit present.     Mental Status: She is alert and oriented to person, place, and time.  Psychiatric:  Mood and Affect:  Mood normal.        Thought Content: Thought content normal.    Labs reviewed: Basic Metabolic Panel: Recent Labs    08/22/20 0000 08/30/20 1039 04/12/21 0426 04/13/21 0401 04/14/21 0357 04/17/21 0301 04/18/21 0354 04/23/21 0000  NA 143   < > 137   < > 138 139 141 141  K 3.3*   < > 3.0*   < > 3.9 3.4* 3.8 4.2  CL 101   < > 99   < > 105 108 107 102  CO2 28*   < > 26   < > 26 22 25  31*  GLUCOSE  --    < > 107*   < > 154* 97 83  --   BUN 27*   < > 16   < > 19 24* 30* 23*  CREATININE 1.0   < > 1.03*   < > 0.76 0.84 1.04* 1.0  CALCIUM 9.8   < > 8.9   < > 9.5 8.8* 9.1 9.3  MG  --   --  2.1  --  2.2  --  2.5*  --   TSH 1.20  --  0.317*  --   --   --   --   --    < > = values in this interval not displayed.   Liver Function Tests: Recent Labs    08/22/20 0000 12/21/20 0000 04/11/21 0950  AST 27 24 30   ALT 24 18 37  ALKPHOS 71 72 99  BILITOT  --   --  1.0  PROT  --   --  6.8  ALBUMIN 4.4 4.3 3.6   No results for input(s): LIPASE, AMYLASE in the last 8760 hours. No results for input(s): AMMONIA in the last 8760 hours. CBC: Recent Labs    08/30/20 1039 12/21/20 0000 04/11/21 0950 04/12/21 0426 04/13/21 0401 04/17/21 0301 04/23/21 0000  WBC 9.3   < > 11.7* 13.4* 11.8* 11.5* 10.2  NEUTROABS 7.4  --  9.7* 10.7*  --   --   --   HGB 12.7   < > 11.7* 11.5* 11.1* 12.5 13.2  HCT 39.1   < > 36.7 36.0 35.0* 38.9 42  MCV 97.5  --  98.4 98.4 99.2 97.5  --   PLT 271   < > 271 308 306 407* 355   < > = values in this interval not displayed.   Lipid Panel: No results for input(s): CHOL, HDL, LDLCALC, TRIG, CHOLHDL, LDLDIRECT in the last 8760 hours. Lab Results  Component Value Date   HGBA1C 5.7 (H) 06/25/2017    Procedures since last visit: CT Angio Chest PE W and/or Wo Contrast  Result Date: 04/11/2021 CLINICAL DATA:  Shortness of breath. EXAM: CT ANGIOGRAPHY CHEST WITH CONTRAST TECHNIQUE: Multidetector CT imaging of the chest was performed using the standard protocol  during bolus administration of intravenous contrast. Multiplanar CT image reconstructions and MIPs were obtained to evaluate the vascular anatomy. CONTRAST:  81mL OMNIPAQUE IOHEXOL 350 MG/ML SOLN COMPARISON:  October 20, 2006. FINDINGS: Cardiovascular: Satisfactory opacification of the pulmonary arteries to the segmental level. No evidence of pulmonary embolism. Mild cardiomegaly is noted. Coronary calcifications are noted. No pericardial effusion. Atherosclerosis of thoracic aorta is noted without aneurysm formation. Mediastinum/Nodes: No enlarged mediastinal, hilar, or axillary lymph nodes. Thyroid gland, trachea, and esophagus demonstrate no significant findings. Lungs/Pleura: No pneumothorax is noted. Small bilateral pleural effusions are noted with adjacent sub some atelectasis, left greater than  right. Multiple patchy airspace opacities are noted consistent with multifocal pneumonia. Upper Abdomen: No acute abnormality. Musculoskeletal: No chest wall abnormality. No acute or significant osseous findings. Review of the MIP images confirms the above findings. IMPRESSION: No definite evidence of pulmonary embolus. Multiple patchy airspace opacities are noted bilaterally consistent with multifocal pneumonia. Small bilateral pleural effusions are noted with adjacent subsegmental atelectasis, left greater than right. Coronary calcifications are noted suggesting coronary disease. Aortic Atherosclerosis (ICD10-I70.0). Electronically Signed   By: Marijo Conception M.D.   On: 04/11/2021 12:54   DG CHEST PORT 1 VIEW  Result Date: 04/14/2021 CLINICAL DATA:  Shortness of breath. EXAM: PORTABLE CHEST 1 VIEW COMPARISON:  04/11/2021. FINDINGS: Mildly increased conspicuity of patchy consolidation in the right lung and similar dense left basilar opacity, better characterized on recent CT chest. No visible pneumothorax. Similar small left pleural effusion. Similar cardiomediastinal silhouette, mildly enlarged. Left subclavian  approach cardiac rhythm maintenance device. Aortic atherosclerosis. IMPRESSION: 1. Mildly increased conspicuity of patchy consolidation in the right lung and similar dense left basilar opacity, better characterized on recent CT chest. 2. Similar small left pleural effusion. Electronically Signed   By: Margaretha Sheffield M.D.   On: 04/14/2021 10:07   DG Chest Port 1 View  Result Date: 04/11/2021 CLINICAL DATA:  Shortness of breath EXAM: PORTABLE CHEST 1 VIEW COMPARISON:  May 2022 FINDINGS: Ill-defined patchy opacities bilaterally. Probable Kerley B lines. Left lung base pleuroparenchymal opacity. Stable cardiomediastinal contours with mild cardiomegaly. No pneumothorax. IMPRESSION: Ill-defined patchy opacities bilaterally and left basilar opacification. Favor edema given probable Kerley B-lines. Possible small left pleural effusion. Atypical/viral pneumonia is also a consideration. Electronically Signed   By: Macy Mis M.D.   On: 04/11/2021 09:31   ECHOCARDIOGRAM COMPLETE  Result Date: 04/12/2021    ECHOCARDIOGRAM REPORT   Patient Name:   Jody Blake Date of Exam: 04/12/2021 Medical Rec #:  [id number]      Height:       63.0 in Accession #:    [id number]     Weight:       129.2 lb Date of Birth:  03-05-1926      BSA:          1.606 m Patient Age:    88 years       BP:           179/76 mmHg Patient Gender: F              HR:           60 bpm. Exam Location:  Inpatient Procedure: 2D Echo, 3D Echo, Cardiac Doppler and Color Doppler Indications:    I50.40* Unspecified combined systolic (congestive) and diastolic                 (congestive) heart failure  History:        Patient has prior history of Echocardiogram examinations, most                 recent 01/15/2019. Abnormal ECG and Pacemaker, COPD and TIA,                 Aortic Valve Disease, Arrythmias:Atrial Fibrillation,                 Signs/Symptoms:Syncope, Shortness of Breath and Dyspnea; Risk                 Factors:Hypertension. Aortic stenosis.   Sonographer:    Roseanna Rainbow RDCS Referring Phys: [phone] Doreatha Lew  [facility]  Sonographer Comments: Technically difficult study due to poor echo windows. Very low parasternal window. Patient in high fowler's position. IMPRESSIONS  1. Left ventricular ejection fraction, by estimation, is 65 to 70%. The left ventricle has normal function. There is moderate left ventricular hypertrophy. Left ventricular diastolic parameters are indeterminate.  2. Right ventricular systolic function is low normal. The right ventricular size is mildly enlarged. There is severely elevated pulmonary artery systolic pressure.  3. Left atrial size was mildly dilated.  4. Right atrial size was severely dilated.  5. Mild mitral valve regurgitation.  6. Tricuspid valve regurgitation is severe.  7. AV is thickened, calcified with restricted motion Peak and mean gradients through the valve are 52 and 27 mm Hg respectively. Dimensionless index is 0.51. Overall consistent with mild to moderate MR. Compared to echo from October 2020, mean gradient is increased (17 to 27 mm Hg) . The aortic valve is abnormal. Aortic valve regurgitation is mild.  8. The inferior vena cava is normal in size with <50% respiratory variability, suggesting right atrial pressure of 8 mmHg. FINDINGS  Left Ventricle: Left ventricular ejection fraction, by estimation, is 65 to 70%. The left ventricle has normal function. The left ventricular internal cavity size was normal in size. There is moderate left ventricular hypertrophy. Left ventricular diastolic parameters are indeterminate. Right Ventricle: The right ventricular size is mildly enlarged. Right vetricular wall thickness was not assessed. Right ventricular systolic function is low normal. There is severely elevated pulmonary artery systolic pressure. The tricuspid regurgitant velocity is 3.48 m/s, and with an assumed right atrial pressure of 15 mmHg, the estimated right ventricular systolic pressure is 86.7 mmHg. Left  Atrium: Left atrial size was mildly dilated. Right Atrium: Right atrial size was severely dilated. Pericardium: Trivial pericardial effusion is present. Mitral Valve: There is mild thickening of the mitral valve leaflet(s). Mild mitral annular calcification. Mild mitral valve regurgitation. MV peak gradient, 14.6 mmHg. The mean mitral valve gradient is 4.0 mmHg. Tricuspid Valve: The tricuspid valve is normal in structure. Tricuspid valve regurgitation is severe. Aortic Valve: AV is thickened, calcified with restricted motion Peak and mean gradients through the valve are 52 and 27 mm Hg respectively. Dimensionless index is 0.51. Overall consistent with mild to moderate MR. Compared to echo from October 2020, mean  gradient is increased (17 to 27 mm Hg). The aortic valve is abnormal. Aortic valve regurgitation is mild. Aortic valve mean gradient measures 27.2 mmHg. Aortic valve peak gradient measures 52.1 mmHg. Aortic valve area, by VTI measures 1.77 cm. Pulmonic Valve: The pulmonic valve was normal in structure. Pulmonic valve regurgitation is trivial. Aorta: The aortic root and ascending aorta are structurally normal, with no evidence of dilitation. Venous: The inferior vena cava is normal in size with less than 50% respiratory variability, suggesting right atrial pressure of 8 mmHg. IAS/Shunts: The interatrial septum was not assessed. Additional Comments: A device lead is visualized.  LEFT VENTRICLE PLAX 2D LVIDd:         3.80 cm LVIDs:         1.80 cm LV PW:         1.60 cm LV IVS:        1.30 cm LVOT diam:     2.10 cm     3D Volume EF: LV SV:         129         3D EF:        63 % LV SV Index:  80          LV EDV:       91 ml LVOT Area:     3.46 cm    LV ESV:       34 ml                            LV SV:        58 ml  LV Volumes (MOD) LV vol d, MOD A2C: 49.5 ml LV vol d, MOD A4C: 44.7 ml LV vol s, MOD A2C: 21.9 ml LV vol s, MOD A4C: 10.4 ml LV SV MOD A2C:     27.6 ml LV SV MOD A4C:     44.7 ml LV SV MOD BP:       31.5 ml RIGHT VENTRICLE             IVC RV S prime:     12.40 cm/s  IVC diam: 1.70 cm TAPSE (M-mode): 2.1 cm LEFT ATRIUM             Index        RIGHT ATRIUM           Index LA diam:        4.90 cm 3.05 cm/m   RA Area:     31.60 cm LA Vol (A2C):   99.8 ml 62.15 ml/m  RA Volume:   106.00 ml 66.01 ml/m LA Vol (A4C):   58.9 ml 36.68 ml/m LA Biplane Vol: 79.1 ml 49.26 ml/m  AORTIC VALVE                     PULMONIC VALVE AV Area (Vmax):    2.04 cm      PR End Diast Vel: 1.79 msec AV Area (Vmean):   1.99 cm AV Area (VTI):     1.77 cm AV Vmax:           361.00 cm/s AV Vmean:          238.000 cm/s AV VTI:            0.731 m AV Peak Grad:      52.1 mmHg AV Mean Grad:      27.2 mmHg LVOT Vmax:         213.00 cm/s LVOT Vmean:        137.000 cm/s LVOT VTI:          0.373 m LVOT/AV VTI ratio: 0.51  AORTA Ao Root diam: 3.20 cm Ao Asc diam:  3.60 cm MITRAL VALVE                TRICUSPID VALVE MV Area (PHT): 3.40 cm     TR Peak grad:   48.4 mmHg MV Area VTI:   3.81 cm     TR Vmax:        348.00 cm/s MV Peak grad:  14.6 mmHg MV Mean grad:  4.0 mmHg     SHUNTS MV Vmax:       1.91 m/s     Systemic VTI:  0.37 m MV Vmean:      85.6 cm/s    Systemic Diam: 2.10 cm MV Decel Time: 223 msec MV E velocity: 132.00 cm/s Dorris Carnes MD Electronically signed by Dorris Carnes MD Signature Date/Time: 04/12/2021/11:55:13 AM    Final     Assessment/Plan Community acquired pneumonia, bilateral Doing well Was treated with Antibiotics in the hospital Off oxygen No  Fever SOB on Exertion  Acute hypoxemic respiratory failure (HCC) Due to Pneumonia and CHF Acute diastolic CHF (congestive heart failure) (HCC) Continues to have issues with Fluid balance Refusing to move to AL EF 65-70%, bilateral dilated atria, LVH, Mild to mod MR, With severely elevated PAP  Weakness Therapy Decompensated COPD with exacerbation (chronic obstructive pulmonary disease) (HCC) On Duo Mebs and Symbicort Baseline Prednisone Essential hypertension Plan  to make her Losartan QD  Acquired hypothyroidism TSH was low in the hospital  Her Synthroid was decreased Will need follow up of TSH Permanent atrial fibrillation ([place]) On Toprol and Xarelto   Labs/tests ordered:  * No order type specified * Next appt:  05/07/2021

## 2021-04-23 ENCOUNTER — Encounter: Payer: Self-pay | Admitting: Internal Medicine

## 2021-04-23 DIAGNOSIS — I5020 Unspecified systolic (congestive) heart failure: Secondary | ICD-10-CM | POA: Diagnosis not present

## 2021-04-23 LAB — BASIC METABOLIC PANEL
BUN: 23 — AB (ref 4–21)
CO2: 31 — AB (ref 13–22)
Chloride: 102 (ref 99–108)
Creatinine: 1 (ref 0.5–1.1)
Glucose: 86
Potassium: 4.2 (ref 3.4–5.3)
Sodium: 141 (ref 137–147)

## 2021-04-23 LAB — COMPREHENSIVE METABOLIC PANEL: Calcium: 9.3 (ref 8.7–10.7)

## 2021-04-23 LAB — CBC AND DIFFERENTIAL
HCT: 42 (ref 36–46)
Hemoglobin: 13.2 (ref 12.0–16.0)
Platelets: 355 (ref 150–399)
WBC: 10.2

## 2021-04-23 LAB — CBC: RBC: 4.27 (ref 3.87–5.11)

## 2021-04-24 DIAGNOSIS — J9601 Acute respiratory failure with hypoxia: Secondary | ICD-10-CM | POA: Diagnosis not present

## 2021-04-24 DIAGNOSIS — I5031 Acute diastolic (congestive) heart failure: Secondary | ICD-10-CM | POA: Diagnosis not present

## 2021-04-24 DIAGNOSIS — M62561 Muscle wasting and atrophy, not elsewhere classified, right lower leg: Secondary | ICD-10-CM | POA: Diagnosis not present

## 2021-04-24 DIAGNOSIS — R278 Other lack of coordination: Secondary | ICD-10-CM | POA: Diagnosis not present

## 2021-04-24 DIAGNOSIS — J17 Pneumonia in diseases classified elsewhere: Secondary | ICD-10-CM | POA: Diagnosis not present

## 2021-04-24 DIAGNOSIS — M62562 Muscle wasting and atrophy, not elsewhere classified, left lower leg: Secondary | ICD-10-CM | POA: Diagnosis not present

## 2021-04-24 DIAGNOSIS — R2689 Other abnormalities of gait and mobility: Secondary | ICD-10-CM | POA: Diagnosis not present

## 2021-04-25 DIAGNOSIS — M62561 Muscle wasting and atrophy, not elsewhere classified, right lower leg: Secondary | ICD-10-CM | POA: Diagnosis not present

## 2021-04-25 DIAGNOSIS — J17 Pneumonia in diseases classified elsewhere: Secondary | ICD-10-CM | POA: Diagnosis not present

## 2021-04-25 DIAGNOSIS — R2689 Other abnormalities of gait and mobility: Secondary | ICD-10-CM | POA: Diagnosis not present

## 2021-04-25 DIAGNOSIS — M62562 Muscle wasting and atrophy, not elsewhere classified, left lower leg: Secondary | ICD-10-CM | POA: Diagnosis not present

## 2021-04-25 DIAGNOSIS — I5031 Acute diastolic (congestive) heart failure: Secondary | ICD-10-CM | POA: Diagnosis not present

## 2021-04-25 DIAGNOSIS — R278 Other lack of coordination: Secondary | ICD-10-CM | POA: Diagnosis not present

## 2021-04-25 DIAGNOSIS — J9601 Acute respiratory failure with hypoxia: Secondary | ICD-10-CM | POA: Diagnosis not present

## 2021-04-26 DIAGNOSIS — R2689 Other abnormalities of gait and mobility: Secondary | ICD-10-CM | POA: Diagnosis not present

## 2021-04-26 DIAGNOSIS — I5031 Acute diastolic (congestive) heart failure: Secondary | ICD-10-CM | POA: Diagnosis not present

## 2021-04-26 DIAGNOSIS — R278 Other lack of coordination: Secondary | ICD-10-CM | POA: Diagnosis not present

## 2021-04-26 DIAGNOSIS — J9601 Acute respiratory failure with hypoxia: Secondary | ICD-10-CM | POA: Diagnosis not present

## 2021-04-26 DIAGNOSIS — M62561 Muscle wasting and atrophy, not elsewhere classified, right lower leg: Secondary | ICD-10-CM | POA: Diagnosis not present

## 2021-04-26 DIAGNOSIS — J17 Pneumonia in diseases classified elsewhere: Secondary | ICD-10-CM | POA: Diagnosis not present

## 2021-04-26 DIAGNOSIS — M62562 Muscle wasting and atrophy, not elsewhere classified, left lower leg: Secondary | ICD-10-CM | POA: Diagnosis not present

## 2021-04-27 DIAGNOSIS — R278 Other lack of coordination: Secondary | ICD-10-CM | POA: Diagnosis not present

## 2021-04-27 DIAGNOSIS — J9601 Acute respiratory failure with hypoxia: Secondary | ICD-10-CM | POA: Diagnosis not present

## 2021-04-27 DIAGNOSIS — M62562 Muscle wasting and atrophy, not elsewhere classified, left lower leg: Secondary | ICD-10-CM | POA: Diagnosis not present

## 2021-04-27 DIAGNOSIS — R2689 Other abnormalities of gait and mobility: Secondary | ICD-10-CM | POA: Diagnosis not present

## 2021-04-27 DIAGNOSIS — M62561 Muscle wasting and atrophy, not elsewhere classified, right lower leg: Secondary | ICD-10-CM | POA: Diagnosis not present

## 2021-04-27 DIAGNOSIS — M6389 Disorders of muscle in diseases classified elsewhere, multiple sites: Secondary | ICD-10-CM | POA: Diagnosis not present

## 2021-04-27 DIAGNOSIS — J17 Pneumonia in diseases classified elsewhere: Secondary | ICD-10-CM | POA: Diagnosis not present

## 2021-04-27 DIAGNOSIS — I5031 Acute diastolic (congestive) heart failure: Secondary | ICD-10-CM | POA: Diagnosis not present

## 2021-04-30 DIAGNOSIS — R278 Other lack of coordination: Secondary | ICD-10-CM | POA: Diagnosis not present

## 2021-04-30 DIAGNOSIS — M62562 Muscle wasting and atrophy, not elsewhere classified, left lower leg: Secondary | ICD-10-CM | POA: Diagnosis not present

## 2021-04-30 DIAGNOSIS — M62561 Muscle wasting and atrophy, not elsewhere classified, right lower leg: Secondary | ICD-10-CM | POA: Diagnosis not present

## 2021-04-30 DIAGNOSIS — R2689 Other abnormalities of gait and mobility: Secondary | ICD-10-CM | POA: Diagnosis not present

## 2021-04-30 DIAGNOSIS — I5031 Acute diastolic (congestive) heart failure: Secondary | ICD-10-CM | POA: Diagnosis not present

## 2021-04-30 DIAGNOSIS — M6389 Disorders of muscle in diseases classified elsewhere, multiple sites: Secondary | ICD-10-CM | POA: Diagnosis not present

## 2021-04-30 DIAGNOSIS — J9601 Acute respiratory failure with hypoxia: Secondary | ICD-10-CM | POA: Diagnosis not present

## 2021-04-30 DIAGNOSIS — J17 Pneumonia in diseases classified elsewhere: Secondary | ICD-10-CM | POA: Diagnosis not present

## 2021-05-01 DIAGNOSIS — I5031 Acute diastolic (congestive) heart failure: Secondary | ICD-10-CM | POA: Diagnosis not present

## 2021-05-01 DIAGNOSIS — R2689 Other abnormalities of gait and mobility: Secondary | ICD-10-CM | POA: Diagnosis not present

## 2021-05-01 DIAGNOSIS — M62561 Muscle wasting and atrophy, not elsewhere classified, right lower leg: Secondary | ICD-10-CM | POA: Diagnosis not present

## 2021-05-01 DIAGNOSIS — M6389 Disorders of muscle in diseases classified elsewhere, multiple sites: Secondary | ICD-10-CM | POA: Diagnosis not present

## 2021-05-01 DIAGNOSIS — J9601 Acute respiratory failure with hypoxia: Secondary | ICD-10-CM | POA: Diagnosis not present

## 2021-05-01 DIAGNOSIS — J17 Pneumonia in diseases classified elsewhere: Secondary | ICD-10-CM | POA: Diagnosis not present

## 2021-05-01 DIAGNOSIS — M62562 Muscle wasting and atrophy, not elsewhere classified, left lower leg: Secondary | ICD-10-CM | POA: Diagnosis not present

## 2021-05-01 DIAGNOSIS — R278 Other lack of coordination: Secondary | ICD-10-CM | POA: Diagnosis not present

## 2021-05-02 DIAGNOSIS — R2689 Other abnormalities of gait and mobility: Secondary | ICD-10-CM | POA: Diagnosis not present

## 2021-05-02 DIAGNOSIS — M6389 Disorders of muscle in diseases classified elsewhere, multiple sites: Secondary | ICD-10-CM | POA: Diagnosis not present

## 2021-05-02 DIAGNOSIS — J9601 Acute respiratory failure with hypoxia: Secondary | ICD-10-CM | POA: Diagnosis not present

## 2021-05-02 DIAGNOSIS — J17 Pneumonia in diseases classified elsewhere: Secondary | ICD-10-CM | POA: Diagnosis not present

## 2021-05-02 DIAGNOSIS — M62561 Muscle wasting and atrophy, not elsewhere classified, right lower leg: Secondary | ICD-10-CM | POA: Diagnosis not present

## 2021-05-02 DIAGNOSIS — M62562 Muscle wasting and atrophy, not elsewhere classified, left lower leg: Secondary | ICD-10-CM | POA: Diagnosis not present

## 2021-05-02 DIAGNOSIS — R278 Other lack of coordination: Secondary | ICD-10-CM | POA: Diagnosis not present

## 2021-05-02 DIAGNOSIS — I5031 Acute diastolic (congestive) heart failure: Secondary | ICD-10-CM | POA: Diagnosis not present

## 2021-05-03 ENCOUNTER — Encounter: Payer: Self-pay | Admitting: Adult Health

## 2021-05-03 ENCOUNTER — Non-Acute Institutional Stay (SKILLED_NURSING_FACILITY): Payer: Medicare Other | Admitting: Adult Health

## 2021-05-03 DIAGNOSIS — Z66 Do not resuscitate: Secondary | ICD-10-CM

## 2021-05-03 DIAGNOSIS — J189 Pneumonia, unspecified organism: Secondary | ICD-10-CM | POA: Diagnosis not present

## 2021-05-03 DIAGNOSIS — I5031 Acute diastolic (congestive) heart failure: Secondary | ICD-10-CM | POA: Diagnosis not present

## 2021-05-03 DIAGNOSIS — J302 Other seasonal allergic rhinitis: Secondary | ICD-10-CM

## 2021-05-03 DIAGNOSIS — E039 Hypothyroidism, unspecified: Secondary | ICD-10-CM | POA: Diagnosis not present

## 2021-05-03 DIAGNOSIS — I4821 Permanent atrial fibrillation: Secondary | ICD-10-CM | POA: Diagnosis not present

## 2021-05-03 DIAGNOSIS — H353130 Nonexudative age-related macular degeneration, bilateral, stage unspecified: Secondary | ICD-10-CM | POA: Diagnosis not present

## 2021-05-03 DIAGNOSIS — N1831 Chronic kidney disease, stage 3a: Secondary | ICD-10-CM

## 2021-05-03 DIAGNOSIS — K5901 Slow transit constipation: Secondary | ICD-10-CM | POA: Diagnosis not present

## 2021-05-03 DIAGNOSIS — R2689 Other abnormalities of gait and mobility: Secondary | ICD-10-CM | POA: Diagnosis not present

## 2021-05-03 DIAGNOSIS — M6389 Disorders of muscle in diseases classified elsewhere, multiple sites: Secondary | ICD-10-CM | POA: Diagnosis not present

## 2021-05-03 DIAGNOSIS — I442 Atrioventricular block, complete: Secondary | ICD-10-CM | POA: Diagnosis not present

## 2021-05-03 DIAGNOSIS — J9601 Acute respiratory failure with hypoxia: Secondary | ICD-10-CM | POA: Diagnosis not present

## 2021-05-03 DIAGNOSIS — R278 Other lack of coordination: Secondary | ICD-10-CM | POA: Diagnosis not present

## 2021-05-03 DIAGNOSIS — M62561 Muscle wasting and atrophy, not elsewhere classified, right lower leg: Secondary | ICD-10-CM | POA: Diagnosis not present

## 2021-05-03 DIAGNOSIS — J17 Pneumonia in diseases classified elsewhere: Secondary | ICD-10-CM | POA: Diagnosis not present

## 2021-05-03 DIAGNOSIS — J3089 Other allergic rhinitis: Secondary | ICD-10-CM | POA: Diagnosis not present

## 2021-05-03 DIAGNOSIS — I10 Essential (primary) hypertension: Secondary | ICD-10-CM | POA: Diagnosis not present

## 2021-05-03 DIAGNOSIS — J449 Chronic obstructive pulmonary disease, unspecified: Secondary | ICD-10-CM | POA: Diagnosis not present

## 2021-05-03 DIAGNOSIS — M62562 Muscle wasting and atrophy, not elsewhere classified, left lower leg: Secondary | ICD-10-CM | POA: Diagnosis not present

## 2021-05-03 NOTE — Progress Notes (Signed)
Location:  [facility] Room Number: 151-A Place of Service:  SNF [phone])  Provider: Royal Hawthorn, NP   PCP: [name], MD Patient Care Team: [name], MD as PCP - General (Internal Medicine) Jerline Pain, MD as PCP - Cardiology (Cardiology) Thompson Grayer, MD as Consulting Physician (Cardiology) Deneise Lever, MD as Consulting Physician (Pulmonary Disease) Rutherford Guys, MD as Consulting Physician (Ophthalmology) [name], MD as Consulting Physician (Gastroenterology) Gaynelle Arabian, MD as Consulting Physician (Orthopedic Surgery)  Extended Emergency Contact Information Primary Emergency Contact: Jody Blake of [place] Phone: [phone] Mobile Phone: [phone] Relation: Daughter Secondary Emergency Contact: Jody Blake of [place] Phone: [phone] Mobile Phone: [phone] Relation: Son  Code Status: DNR Goals of care:  Advanced Directive information Advanced Directives 05/03/2021  Does Patient Have a Medical Advance Directive? Yes  Type of Paramedic of [place];Living will;Out of facility DNR (pink MOST or yellow form)  Does patient want to make changes to medical advance directive? No - Patient declined  Copy of [place] in Chart? -  Would patient like information on creating a medical advance directive? -  Pre-existing out of facility DNR order (yellow form or pink MOST form) Yellow form placed in chart (order not valid for inpatient use);Pink MOST form placed in chart (order not valid for inpatient use)     Allergies  Allergen Reactions   Brimonidine Tartrate-Timolol Other (See Comments)    REACTION: systemic malaise amigen eye drop   Penicillins Hives   Breo Ellipta [Fluticasone Furoate-Vilanterol] Other (See Comments)    Ran blood pressure up Increased blood pressure   Vancomycin Other (See Comments)    Red man  syndrome - Mild redness and discomfort. Able to complete initial dose.     Chief Complaint  Patient presents with   Discharge Note    Discharge from [facility]     HPI:  88 y.o. female seen for discharge from [facility]. s/p admission from 04/11/21-04/18/21 for acute hypoxemic respiratory failure with pneumonia.  PMH significant for hearing loss, CHF, pacemaker, rhinitis, GERD, HTN, TIA, chronic bronchitis/COPD, glaucoma  and blindness  Chest radiograph with cardiomegaly, small left pleural effusion, increased lung markings bilaterally with patchy infiltrate at the right base, (personally reviewed).  CT chest negative for pulmonary embolism. Multiple patchy airspace opacities, bilaterally predominately at the right base. Small left pleural effusion.   She was treated with diuresis for fluid overload, broad spectrum antibiotics, antitussives, steroids, nebs and is now on prednisone 5 mg. She has completed  She transitioned to rehab and is here for therapy. She previously lived in [place] but required home care and nursing for med management.   During her stay she was placed on duonebs bid for continued cough. Cough and sputum production have improved but have not completely resolved which is her baseline.  Losartan was reduce from BID to QD due to pharmacy recommendation for max dosing. BP controlled.  She is reporting constipation.  Completed therapy and is ambulating well, at baseline.  Going home on Monday with home care support and med management.   Past Medical History:  Diagnosis Date   Anemia    Aortic stenosis    a. Echo 09/06/12 EF 55-60%, no WMA, G2DD, Ao valve sclerosis w/ mod stenosis, LA mildly dilated, PA pressure 49mmHg   Asthmatic bronchitis    Complete heart block (HCC)    s/p permanent  pacemaker 06/27/1999 (Battery change 06/2007 and 2016).  s/p AV nodal ablation by Dr [name] 2016.   COPD (chronic obstructive pulmonary disease) (HCC)    Dr. [name]   DDD  (degenerative disc disease)    Diastolic dysfunction    a. Echo 09/06/12 EF 55-60%, no WMA, G2DD, Ao valve sclerosis w/ mod stenosis, LA mildly dilated, PA pressure 65mmHg   Diverticulosis    DVT (deep vein thrombosis) in pregnancy 1954   a. LLE   Hypertension    Hypothyroidism    on medication   Macular degeneration    Dr. [name]   Osteoarthrosis, unspecified whether generalized or localized, other specified sites    PAF (paroxysmal atrial fibrillation) ([place])    Stopped flecainide, on amiodarone but still has bouts of A FIB (mostly in mornings).    Pure hypercholesterolemia    Staghorn calculus    Left    Past Surgical History:  Procedure Laterality Date   APPENDECTOMY  ~ 1941   AV NODE ABLATION  05/10/2014   AV NODE ABLATION N/A 05/10/2014   Procedure: AV NODE ABLATION;  Surgeon: Thompson Grayer, MD;  Location: [facility] CATH LAB;  Service: Cardiovascular;  Laterality: N/A;   BUNIONECTOMY WITH HAMMERTOE RECONSTRUCTION Bilateral ~ [place]  06/27/99   Medtronic PM implanted by Dr [name]   CARDIOVERSION N/A 12/02/2013   Procedure: CARDIOVERSION;  Surgeon: [name], MD;  Location: [facility];  Service: Cardiovascular;  Laterality: N/A;   CATARACT EXTRACTION W/ INTRAOCULAR LENS  IMPLANT, BILATERAL Bilateral    COLONOSCOPY WITH PROPOFOL N/A 04/22/2013   Procedure: COLONOSCOPY WITH PROPOFOL;  Surgeon: [name], MD;  Location: [facility] ENDOSCOPY;  Service: Endoscopy;  Laterality: N/A;   DILATION AND CURETTAGE OF UTERUS  X 2   "when I was going thru menopause"   ESOPHAGOGASTRODUODENOSCOPY (EGD) WITH PROPOFOL N/A 04/22/2013   Procedure: ESOPHAGOGASTRODUODENOSCOPY (EGD) WITH PROPOFOL;  Surgeon: [name], MD;  Location: [facility] ENDOSCOPY;  Service: Endoscopy;  Laterality: N/A;   HERNIA REPAIR     INCISIONAL HERNIA REPAIR     INSERT / REPLACE / REMOVE PACEMAKER  06/2007   "took out the old; put in new"   INSERT / REPLACE / REMOVE PACEMAKER  05/10/2014   MDT PPM generator  change by Dr [name]   JOINT REPLACEMENT     PARTIAL NEPHRECTOMY Left 05/1974   stone disease   PERMANENT PACEMAKER GENERATOR CHANGE N/A 05/10/2014   Procedure: PERMANENT PACEMAKER GENERATOR CHANGE;  Surgeon: Thompson Grayer, MD;  Location: [facility] CATH LAB;  Service: Cardiovascular;  Laterality: N/A;   TONSILLECTOMY AND ADENOIDECTOMY  1930's   TOTAL KNEE ARTHROPLASTY Right 2001      reports that she has never smoked. She has never used smokeless tobacco. She reports current alcohol use of about 7.0 standard drinks per week. She reports that she does not use drugs. Social History   Socioeconomic History   Marital status: Widowed    Spouse name: Not on file   Number of children: 2   Years of education: Not on file   Highest education level: Not on file  Occupational History   Occupation: RETIRED    Employer: RETIRED    Comment: Family [employer] business  Tobacco Use   Smoking status: Never   Smokeless tobacco: Never  Vaping Use   Vaping Use: Never used  Substance and Sexual Activity   Alcohol use: Yes    Alcohol/week: 7.0 standard drinks    Types: 7 Glasses of wine  per week    Comment: occasionally   Drug use: No   Sexual activity: Not Currently    Partners: Male  Other Topics Concern   Not on file  Social History Narrative   Diet? Low salt, low fat      Do you drink/eat things with caffeine? yes      Marital status?                 married                   What year were you married? 1949      Do you live in a house, apartment, assisted living, condo, trailer, etc.? apartment      Is it one or more stories? 3 stories      How many persons live in your home? Just me      Do you have any pets in your home? (please list) no      Current or past profession: accounting      Do you exercise?          yes                            Type & how often? Walk, class, prescribed daily      Do you have a living will? yes      Do you have a DNR form?     yes                             If  not, do you want to discuss one?      Do you have signed POA/HPOA for forms? no         Social Determinants of Health   Financial Resource Strain: Low Risk    Difficulty of Paying Living Expenses: Not hard at all  Food Insecurity: No Food Insecurity   Worried About Charity fundraiser in the Last Year: Never true   [place] in the Last Year: Never true  Transportation Needs: No Transportation Needs   Lack of Transportation (Medical): No   Lack of Transportation (Non-Medical): No  Physical Activity: Sufficiently Active   Days of Exercise per Week: 7 days   Minutes of Exercise per Session: 30 min  Stress: No Stress Concern Present   Feeling of Stress : Not at all  Social Connections: Moderately Isolated   Frequency of Communication with Friends and Family: More than three times a week   Frequency of Social Gatherings with Friends and Family: More than three times a week   Attends Religious Services: Never   Marine scientist or Organizations: Yes   Attends Music therapist: More than 4 times per year   Marital Status: Widowed  Human resources officer Violence: Not At Risk   Fear of Current or Ex-Partner: No   Emotionally Abused: No   Physically Abused: No   Sexually Abused: No   Functional Status Survey:    Allergies  Allergen Reactions   Brimonidine Tartrate-Timolol Other (See Comments)    REACTION: systemic malaise amigen eye drop   Penicillins Hives   Breo Ellipta [Fluticasone Furoate-Vilanterol] Other (See Comments)    Ran blood pressure up Increased blood pressure   Vancomycin Other (See Comments)    Red man syndrome - Mild redness and discomfort. Able to complete initial dose.  Pertinent  Health Maintenance Due  Topic Date Due   INFLUENZA VACCINE  Completed   DEXA SCAN  Discontinued    Medications: Outpatient Encounter Medications as of 05/03/2021  Medication Sig   Azelastine HCl 137 MCG/SPRAY SOLN USE 1 TO 2 SPRAYS INTO BOTH NOSTRILS  TWICE DAILY.   Cholecalciferol (VITAMIN D) 125 MCG (5000 UT) CAPS Take 1 capsule by mouth daily.   dorzolamide (TRUSOPT) 2 % ophthalmic solution 1 drop 2 (two) times daily.   famotidine (PEPCID) 40 MG tablet Take 1 tablet (40 mg total) by mouth at bedtime.   fluticasone (FLONASE) 50 MCG/ACT nasal spray USE 2 SPRAYS EACH NOSTRIL ONCE A DAY AS NEEDED FOR ALLERGIES OR CONGESTION.   furosemide (LASIX) 20 MG tablet Take 20 mg by mouth 2 (two) times a week. Monday and Thursday, along with other dosing listed on medication list   furosemide (LASIX) 40 MG tablet TAKE 1 AND 1/2 TABLETS (60MG ) BY MOUTH ONCE DAILY   gabapentin (NEURONTIN) 100 MG capsule Take 100 mg by mouth at bedtime.   guaiFENesin (ROBITUSSIN) 100 MG/5ML liquid Take 5 mLs by mouth every 6 (six) hours as needed for cough or to loosen phlegm.   ipratropium-albuterol (DUONEB) 0.5-2.5 (3) MG/3ML SOLN Take 3 mLs by nebulization 2 (two) times daily. And every 8 hours as needed   latanoprost (XALATAN) 0.005 % ophthalmic solution Place 1 drop into both eyes at bedtime.   levothyroxine (SYNTHROID) 75 MCG tablet Take 1 tablet (75 mcg total) by mouth daily before breakfast.   loratadine (CLARITIN) 10 MG tablet Take 1 tablet (10 mg total) by mouth daily.   losartan (COZAAR) 100 MG tablet Take 100 mg by mouth daily.   Lutein 20 MG CAPS Take 1 capsule (20 mg total) by mouth daily.   Magnesium 250 MG TABS Take 1 tablet by mouth daily.   metoprolol succinate (TOPROL XL) 50 MG 24 hr tablet Take 1 tablet (50 mg total) by mouth daily. Take with or immediately following a meal.   montelukast (SINGULAIR) 10 MG tablet Take 1 tablet (10 mg total) by mouth daily.   Multiple Vitamins-Minerals (PRESERVISION AREDS 2) CAPS Take 1 capsule by mouth in the morning and at bedtime.    potassium chloride (KLOR-CON M) 10 MEQ tablet Take 20 mEq by mouth daily.   predniSONE (DELTASONE) 5 MG tablet Take 1 tablet (5 mg total) by mouth daily with breakfast.   Rivaroxaban  (XARELTO) 15 MG TABS tablet TAKE 1 TABLET BY MOUTH  DAILY WITH SUPPER   sennosides-docusate sodium (SENOKOT-S) 8.6-50 MG tablet Take 1 tablet by mouth at bedtime.   SYMBICORT 160-4.5 MCG/ACT inhaler INHALE 2 PUFFS BY MOUTH  INTO THE LUNGS TWO TIMES  DAILY   VENTOLIN HFA 108 (90 Base) MCG/ACT inhaler USE 2 PUFFS EVERY 6 HOURS AS NEEDED FOR SHORTNESS OF BREATH AND WHEEZING.   No facility-administered encounter medications on file as of 05/03/2021.    Review of Systems  Constitutional:  Negative for activity change, appetite change, chills, diaphoresis, fatigue, fever and unexpected weight change.  HENT:  Positive for congestion, hearing loss and postnasal drip. Negative for sinus pressure, sinus pain, sneezing, sore throat and trouble swallowing.   Eyes:        Vision loss  Respiratory:  Positive for cough and shortness of breath (on exertion, chronic). Negative for wheezing.   Cardiovascular:  Positive for leg swelling (wearing compression hose). Negative for chest pain and palpitations.  Gastrointestinal:  Negative for abdominal distention, abdominal pain, constipation  and diarrhea.  Genitourinary:  Negative for difficulty urinating and dysuria.  Musculoskeletal:  Negative for arthralgias, back pain, gait problem, joint swelling and myalgias.  Neurological:  Negative for dizziness, tremors, seizures, syncope, facial asymmetry, speech difficulty, weakness, light-headedness, numbness and headaches.  Psychiatric/Behavioral:  Positive for confusion. Negative for agitation and behavioral problems.    Vitals:   05/03/21 0953  BP: (!) 142/80  Pulse: 60  Resp: 14  Temp: 97.9 F (36.6 C)  SpO2: 97%  Weight: 125 lb 6.4 oz (56.9 kg)   Body mass index is 22.21 kg/m. Physical Exam Vitals and nursing note reviewed.  Constitutional:      General: She is not in acute distress.    Appearance: She is not diaphoretic.  HENT:     Head: Normocephalic and atraumatic.  Neck:     Vascular: No JVD.   Cardiovascular:     Rate and Rhythm: Normal rate and regular rhythm.     Heart sounds: No murmur heard. Pulmonary:     Effort: Pulmonary effort is normal. No respiratory distress.     Breath sounds: No stridor. Rales (bases) present. No wheezing or rhonchi.  Abdominal:     General: Abdomen is flat. Bowel sounds are normal.     Palpations: Abdomen is soft.  Musculoskeletal:     Cervical back: No rigidity or tenderness.     Comments: BLE edema +1  Lymphadenopathy:     Cervical: No cervical adenopathy.  Skin:    General: Skin is warm and dry.     Findings: Bruising (Left knee not tender) present.  Neurological:     Mental Status: She is alert and oriented to person, place, and time.  Psychiatric:        Mood and Affect: Mood normal.    Labs reviewed: Basic Metabolic Panel: Recent Labs    04/12/21 0426 04/13/21 0401 04/14/21 0357 04/17/21 0301 04/18/21 0354 04/23/21 0000  NA 137   < > 138 139 141 141  K 3.0*   < > 3.9 3.4* 3.8 4.2  CL 99   < > 105 108 107 102  CO2 26   < > 26 22 25  31*  GLUCOSE 107*   < > 154* 97 83  --   BUN 16   < > 19 24* 30* 23*  CREATININE 1.03*   < > 0.76 0.84 1.04* 1.0  CALCIUM 8.9   < > 9.5 8.8* 9.1 9.3  MG 2.1  --  2.2  --  2.5*  --    < > = values in this interval not displayed.   Liver Function Tests: Recent Labs    08/22/20 0000 12/21/20 0000 04/11/21 0950  AST 27 24 30   [place] 24 18 37  ALKPHOS 71 72 99  BILITOT  --   --  1.0  PROT  --   --  6.8  ALBUMIN 4.4 4.3 3.6   No results for input(s): LIPASE, AMYLASE in the last 8760 hours. No results for input(s): AMMONIA in the last 8760 hours. CBC: Recent Labs    08/30/20 1039 12/21/20 0000 04/11/21 0950 04/12/21 0426 04/13/21 0401 04/17/21 0301 04/23/21 0000  WBC 9.3   < > 11.7* 13.4* 11.8* 11.5* 10.2  NEUTROABS 7.4  --  9.7* 10.7*  --   --   --   HGB 12.7   < > 11.7* 11.5* 11.1* 12.5 13.2  HCT 39.1   < > 36.7 36.0 35.0* 38.9 42  MCV 97.5  --  98.4 98.4 99.2 97.5  --   PLT  271   < > 271 308 306 407* 355   < > = values in this interval not displayed.   Cardiac Enzymes: No results for input(s): CKTOTAL, CKMB, CKMBINDEX, TROPONINI in the last 8760 hours. BNP: Invalid input(s): POCBNP CBG: No results for input(s): GLUCAP in the last 8760 hours.  Procedures and Imaging Studies During Stay: CT Angio Chest PE W and/or Wo Contrast  Result Date: 04/11/2021 CLINICAL DATA:  Shortness of breath. EXAM: CT ANGIOGRAPHY CHEST WITH CONTRAST TECHNIQUE: Multidetector CT imaging of the chest was performed using the standard protocol during bolus administration of intravenous contrast. Multiplanar CT image reconstructions and MIPs were obtained to evaluate the vascular anatomy. CONTRAST:  27mL OMNIPAQUE IOHEXOL 350 MG/ML SOLN COMPARISON:  October 20, 2006. FINDINGS: Cardiovascular: Satisfactory opacification of the pulmonary arteries to the segmental level. No evidence of pulmonary embolism. Mild cardiomegaly is noted. Coronary calcifications are noted. No pericardial effusion. Atherosclerosis of thoracic aorta is noted without aneurysm formation. Mediastinum/Nodes: No enlarged mediastinal, hilar, or axillary lymph nodes. Thyroid gland, trachea, and esophagus demonstrate no significant findings. Lungs/Pleura: No pneumothorax is noted. Small bilateral pleural effusions are noted with adjacent sub some atelectasis, left greater than right. Multiple patchy airspace opacities are noted consistent with multifocal pneumonia. Upper Abdomen: No acute abnormality. Musculoskeletal: No chest wall abnormality. No acute or significant osseous findings. Review of the MIP images confirms the above findings. IMPRESSION: No definite evidence of pulmonary embolus. Multiple patchy airspace opacities are noted bilaterally consistent with multifocal pneumonia. Small bilateral pleural effusions are noted with adjacent subsegmental atelectasis, left greater than right. Coronary calcifications are noted suggesting  coronary disease. Aortic Atherosclerosis (ICD10-I70.0). Electronically Signed   By: Marijo Conception M.D.   On: 04/11/2021 12:54   DG CHEST PORT 1 VIEW  Result Date: 04/14/2021 CLINICAL DATA:  Shortness of breath. EXAM: PORTABLE CHEST 1 VIEW COMPARISON:  04/11/2021. FINDINGS: Mildly increased conspicuity of patchy consolidation in the right lung and similar dense left basilar opacity, better characterized on recent CT chest. No visible pneumothorax. Similar small left pleural effusion. Similar cardiomediastinal silhouette, mildly enlarged. Left subclavian approach cardiac rhythm maintenance device. Aortic atherosclerosis. IMPRESSION: 1. Mildly increased conspicuity of patchy consolidation in the right lung and similar dense left basilar opacity, better characterized on recent CT chest. 2. Similar small left pleural effusion. Electronically Signed   By: Margaretha Sheffield M.D.   On: 04/14/2021 10:07   DG Chest Port 1 View  Result Date: 04/11/2021 CLINICAL DATA:  Shortness of breath EXAM: PORTABLE CHEST 1 VIEW COMPARISON:  May 2022 FINDINGS: Ill-defined patchy opacities bilaterally. Probable Kerley B lines. Left lung base pleuroparenchymal opacity. Stable cardiomediastinal contours with mild cardiomegaly. No pneumothorax. IMPRESSION: Ill-defined patchy opacities bilaterally and left basilar opacification. Favor edema given probable Kerley B-lines. Possible small left pleural effusion. Atypical/viral pneumonia is also a consideration. Electronically Signed   By: Macy Mis M.D.   On: 04/11/2021 09:31   ECHOCARDIOGRAM COMPLETE  Result Date: 04/12/2021    ECHOCARDIOGRAM REPORT   Patient Name:   Jody Blake Date of Exam: 04/12/2021 Medical Rec #:  [id number]      Height:       63.0 in Accession #:    [id number]     Weight:       129.2 lb Date of Birth:  08/16/1925      BSA:          1.606 m Patient Age:  89 years       BP:           179/76 mmHg Patient Gender: F              HR:           60 bpm. Exam  Location:  Inpatient Procedure: 2D Echo, 3D Echo, Cardiac Doppler and Color Doppler Indications:    I50.40* Unspecified combined systolic (congestive) and diastolic                 (congestive) heart failure  History:        Patient has prior history of Echocardiogram examinations, most                 recent 01/15/2019. Abnormal ECG and Pacemaker, COPD and TIA,                 Aortic Valve Disease, Arrythmias:Atrial Fibrillation,                 Signs/Symptoms:Syncope, Shortness of Breath and Dyspnea; Risk                 Factors:Hypertension. Aortic stenosis.  Sonographer:    Roseanna Rainbow RDCS Referring Phys: [place]  Sonographer Comments: Technically difficult study due to poor echo windows. Very low parasternal window. Patient in high fowler's position. IMPRESSIONS  1. Left ventricular ejection fraction, by estimation, is 65 to 70%. The left ventricle has normal function. There is moderate left ventricular hypertrophy. Left ventricular diastolic parameters are indeterminate.  2. Right ventricular systolic function is low normal. The right ventricular size is mildly enlarged. There is severely elevated pulmonary artery systolic pressure.  3. Left atrial size was mildly dilated.  4. Right atrial size was severely dilated.  5. Mild mitral valve regurgitation.  6. Tricuspid valve regurgitation is severe.  7. AV is thickened, calcified with restricted motion Peak and mean gradients through the valve are 52 and 27 mm Hg respectively. Dimensionless index is 0.51. Overall consistent with mild to moderate MR. Compared to echo from October 2020, mean gradient is increased (17 to 27 mm Hg) . The aortic valve is abnormal. Aortic valve regurgitation is mild.  8. The inferior vena cava is normal in size with <50% respiratory variability, suggesting right atrial pressure of 8 mmHg. FINDINGS  Left Ventricle: Left ventricular ejection fraction, by estimation, is 65 to 70%. The left ventricle has normal function. The  left ventricular internal cavity size was normal in size. There is moderate left ventricular hypertrophy. Left ventricular diastolic parameters are indeterminate. Right Ventricle: The right ventricular size is mildly enlarged. Right vetricular wall thickness was not assessed. Right ventricular systolic function is low normal. There is severely elevated pulmonary artery systolic pressure. The tricuspid regurgitant velocity is 3.48 m/s, and with an assumed right atrial pressure of 15 mmHg, the estimated right ventricular systolic pressure is 24.4 mmHg. Left Atrium: Left atrial size was mildly dilated. Right Atrium: Right atrial size was severely dilated. Pericardium: Trivial pericardial effusion is present. Mitral Valve: There is mild thickening of the mitral valve leaflet(s). Mild mitral annular calcification. Mild mitral valve regurgitation. MV peak gradient, 14.6 mmHg. The mean mitral valve gradient is 4.0 mmHg. Tricuspid Valve: The tricuspid valve is normal in structure. Tricuspid valve regurgitation is severe. Aortic Valve: AV is thickened, calcified with restricted motion Peak and mean gradients through the valve are 52 and 27 mm Hg respectively. Dimensionless index is 0.51. Overall consistent with mild to moderate  MR. Compared to echo from October 2020, mean  gradient is increased (17 to 27 mm Hg). The aortic valve is abnormal. Aortic valve regurgitation is mild. Aortic valve mean gradient measures 27.2 mmHg. Aortic valve peak gradient measures 52.1 mmHg. Aortic valve area, by VTI measures 1.77 cm. Pulmonic Valve: The pulmonic valve was normal in structure. Pulmonic valve regurgitation is trivial. Aorta: The aortic root and ascending aorta are structurally normal, with no evidence of dilitation. Venous: The inferior vena cava is normal in size with less than 50% respiratory variability, suggesting right atrial pressure of 8 mmHg. IAS/Shunts: The interatrial septum was not assessed. Additional Comments: A  device lead is visualized.  LEFT VENTRICLE PLAX 2D LVIDd:         3.80 cm LVIDs:         1.80 cm LV PW:         1.60 cm LV IVS:        1.30 cm LVOT diam:     2.10 cm     3D Volume EF: LV SV:         129         3D EF:        63 % LV SV Index:   80          LV EDV:       91 ml LVOT Area:     3.46 cm    LV ESV:       34 ml                            LV SV:        58 ml  LV Volumes (MOD) LV vol d, MOD A2C: 49.5 ml LV vol d, MOD A4C: 44.7 ml LV vol s, MOD A2C: 21.9 ml LV vol s, MOD A4C: 10.4 ml LV SV MOD A2C:     27.6 ml LV SV MOD A4C:     44.7 ml LV SV MOD BP:      31.5 ml RIGHT VENTRICLE             IVC RV S prime:     12.40 cm/s  IVC diam: 1.70 cm TAPSE (M-mode): 2.1 cm LEFT ATRIUM             Index        RIGHT ATRIUM           Index LA diam:        4.90 cm 3.05 cm/m   RA Area:     31.60 cm LA Vol (A2C):   99.8 ml 62.15 ml/m  RA Volume:   106.00 ml 66.01 ml/m LA Vol (A4C):   58.9 ml 36.68 ml/m LA Biplane Vol: 79.1 ml 49.26 ml/m  AORTIC VALVE                     PULMONIC VALVE AV Area (Vmax):    2.04 cm      PR End Diast Vel: 1.79 msec AV Area (Vmean):   1.99 cm AV Area (VTI):     1.77 cm AV Vmax:           361.00 cm/s AV Vmean:          238.000 cm/s AV VTI:            0.731 m AV Peak Grad:      52.1 mmHg AV Mean Grad:  27.2 mmHg LVOT Vmax:         213.00 cm/s LVOT Vmean:        137.000 cm/s LVOT VTI:          0.373 m LVOT/AV VTI ratio: 0.51  AORTA Ao Root diam: 3.20 cm Ao Asc diam:  3.60 cm MITRAL VALVE                TRICUSPID VALVE MV Area (PHT): 3.40 cm     TR Peak grad:   48.4 mmHg MV Area VTI:   3.81 cm     TR Vmax:        348.00 cm/s MV Peak grad:  14.6 mmHg MV Mean grad:  4.0 mmHg     SHUNTS MV Vmax:       1.91 m/s     Systemic VTI:  0.37 m MV Vmean:      85.6 cm/s    Systemic Diam: 2.10 cm MV Decel Time: 223 msec MV E velocity: 132.00 cm/s Dorris Carnes MD Electronically signed by Dorris Carnes MD Signature Date/Time: 04/12/2021/11:55:13 AM    Final     Assessment/Plan:   1. Do not  resuscitate  - Do not attempt resuscitation (DNR)  2. Community acquired pneumonia, bilateral Improved with antibiotics, off oxygen Improved cough and sob   3. Acute diastolic CHF (congestive heart failure) ([place]) 04/12/21 EF 65-70%, bilateral dilated atria, LVH, Mild to mod MR, severely elevated pulmonary artery pressure, aortic regurg noted  Improved Weight is down 5 lbs Continue lasix, compression hose, low salt diet Followed by cardiology.   4. COPD mixed type (HCC) Improved, change duonebs to prn as she is not able to do this at home. Will monitor here at [facility] for pending d/c Monday. Continues on Symbicort and has prn albuterol. ON prednisone chronically.   5. Seasonal and perennial allergic rhinitis Has chronic post nasal drip despite multiple treatments. Improved during her stay. Continues on flonase, astelin, claritin.   6. Slow transit constipation Increase senna s to 2 tabs whs   7. Complete heart block ([place]) S/p pacemaker.   8. Permanent atrial fibrillation (HCC) Currently on toprol, xarelto for cva risk reduction.   9. Essential hypertension Controlled No change in bp with reduction losartan at 100 mg qd   10. Acquired hypothyroidism Lab Results  Component Value Date   TSH 0.317 (L) 04/12/2021  Needs TSH at next visit.    11. Stage 3a chronic kidney disease ([place]) Continue to periodically monitor BMP and avoid nephrotoxic agents  12. Bilateral nonexudative age-related macular degeneration, unspecified stage Vision loss is significant and requires assistance at home.   May d/c home     Patient is being discharged with the following home health services:  Homecare  Patient is being discharged with the following durable medical equipment:   NA Patient has been advised to f/u with their PCP in 1-2 weeks to for a transitions of care visit.  Social services at their facility was responsible for arranging this appointment.  Pt was provided with adequate  prescriptions of noncontrolled medications to reach the scheduled appointment .  For controlled substances, a limited supply was provided as appropriate for the individual patient.  If the pt normally receives these medications from a pain clinic or has a contract with another physician, these medications should be received from that clinic or physician only).    Future labs/tests needed:  TSH BMP  Discharge review, assessment, plan, and coordination took >30 min

## 2021-05-04 ENCOUNTER — Encounter: Payer: Self-pay | Admitting: Adult Health

## 2021-05-04 DIAGNOSIS — M6389 Disorders of muscle in diseases classified elsewhere, multiple sites: Secondary | ICD-10-CM | POA: Diagnosis not present

## 2021-05-04 DIAGNOSIS — R2689 Other abnormalities of gait and mobility: Secondary | ICD-10-CM | POA: Diagnosis not present

## 2021-05-04 DIAGNOSIS — M62561 Muscle wasting and atrophy, not elsewhere classified, right lower leg: Secondary | ICD-10-CM | POA: Diagnosis not present

## 2021-05-04 DIAGNOSIS — R278 Other lack of coordination: Secondary | ICD-10-CM | POA: Diagnosis not present

## 2021-05-04 DIAGNOSIS — I5031 Acute diastolic (congestive) heart failure: Secondary | ICD-10-CM | POA: Diagnosis not present

## 2021-05-04 DIAGNOSIS — J9601 Acute respiratory failure with hypoxia: Secondary | ICD-10-CM | POA: Diagnosis not present

## 2021-05-04 DIAGNOSIS — J17 Pneumonia in diseases classified elsewhere: Secondary | ICD-10-CM | POA: Diagnosis not present

## 2021-05-04 DIAGNOSIS — M62562 Muscle wasting and atrophy, not elsewhere classified, left lower leg: Secondary | ICD-10-CM | POA: Diagnosis not present

## 2021-05-06 ENCOUNTER — Encounter: Payer: Self-pay | Admitting: Internal Medicine

## 2021-05-07 ENCOUNTER — Encounter: Payer: Medicare Other | Admitting: Adult Health

## 2021-05-07 DIAGNOSIS — J9601 Acute respiratory failure with hypoxia: Secondary | ICD-10-CM | POA: Diagnosis not present

## 2021-05-07 DIAGNOSIS — M62561 Muscle wasting and atrophy, not elsewhere classified, right lower leg: Secondary | ICD-10-CM | POA: Diagnosis not present

## 2021-05-07 DIAGNOSIS — R2689 Other abnormalities of gait and mobility: Secondary | ICD-10-CM | POA: Diagnosis not present

## 2021-05-07 DIAGNOSIS — J17 Pneumonia in diseases classified elsewhere: Secondary | ICD-10-CM | POA: Diagnosis not present

## 2021-05-07 DIAGNOSIS — R278 Other lack of coordination: Secondary | ICD-10-CM | POA: Diagnosis not present

## 2021-05-07 DIAGNOSIS — I5031 Acute diastolic (congestive) heart failure: Secondary | ICD-10-CM | POA: Diagnosis not present

## 2021-05-07 DIAGNOSIS — M6389 Disorders of muscle in diseases classified elsewhere, multiple sites: Secondary | ICD-10-CM | POA: Diagnosis not present

## 2021-05-07 DIAGNOSIS — M62562 Muscle wasting and atrophy, not elsewhere classified, left lower leg: Secondary | ICD-10-CM | POA: Diagnosis not present

## 2021-05-08 DIAGNOSIS — M6389 Disorders of muscle in diseases classified elsewhere, multiple sites: Secondary | ICD-10-CM | POA: Diagnosis not present

## 2021-05-08 DIAGNOSIS — M62561 Muscle wasting and atrophy, not elsewhere classified, right lower leg: Secondary | ICD-10-CM | POA: Diagnosis not present

## 2021-05-08 DIAGNOSIS — J9601 Acute respiratory failure with hypoxia: Secondary | ICD-10-CM | POA: Diagnosis not present

## 2021-05-08 DIAGNOSIS — R2689 Other abnormalities of gait and mobility: Secondary | ICD-10-CM | POA: Diagnosis not present

## 2021-05-08 DIAGNOSIS — I5031 Acute diastolic (congestive) heart failure: Secondary | ICD-10-CM | POA: Diagnosis not present

## 2021-05-08 DIAGNOSIS — R278 Other lack of coordination: Secondary | ICD-10-CM | POA: Diagnosis not present

## 2021-05-08 DIAGNOSIS — M62562 Muscle wasting and atrophy, not elsewhere classified, left lower leg: Secondary | ICD-10-CM | POA: Diagnosis not present

## 2021-05-08 DIAGNOSIS — J17 Pneumonia in diseases classified elsewhere: Secondary | ICD-10-CM | POA: Diagnosis not present

## 2021-05-09 ENCOUNTER — Other Ambulatory Visit: Payer: Self-pay

## 2021-05-09 ENCOUNTER — Encounter: Payer: Self-pay | Admitting: Internal Medicine

## 2021-05-09 ENCOUNTER — Non-Acute Institutional Stay: Payer: Medicare Other | Admitting: Internal Medicine

## 2021-05-09 VITALS — BP 170/100 | HR 66 | Temp 97.7°F | Ht 63.0 in | Wt 130.4 lb

## 2021-05-09 DIAGNOSIS — I10 Essential (primary) hypertension: Secondary | ICD-10-CM

## 2021-05-09 DIAGNOSIS — E039 Hypothyroidism, unspecified: Secondary | ICD-10-CM | POA: Diagnosis not present

## 2021-05-09 DIAGNOSIS — I4821 Permanent atrial fibrillation: Secondary | ICD-10-CM

## 2021-05-09 DIAGNOSIS — K5901 Slow transit constipation: Secondary | ICD-10-CM | POA: Diagnosis not present

## 2021-05-09 DIAGNOSIS — I442 Atrioventricular block, complete: Secondary | ICD-10-CM | POA: Diagnosis not present

## 2021-05-09 DIAGNOSIS — J449 Chronic obstructive pulmonary disease, unspecified: Secondary | ICD-10-CM

## 2021-05-09 DIAGNOSIS — N1831 Chronic kidney disease, stage 3a: Secondary | ICD-10-CM

## 2021-05-09 DIAGNOSIS — J189 Pneumonia, unspecified organism: Secondary | ICD-10-CM | POA: Diagnosis not present

## 2021-05-09 DIAGNOSIS — I5032 Chronic diastolic (congestive) heart failure: Secondary | ICD-10-CM

## 2021-05-09 MED ORDER — LEVOTHYROXINE SODIUM 75 MCG PO TABS: 75.0000 ug | ORAL_TABLET | Freq: Every day | ORAL | 3 refills | Status: DC

## 2021-05-09 NOTE — Progress Notes (Signed)
Location: [place] of Service:  Clinic (12)  Provider:   Code Status: DNR Goals of Care:  Advanced Directives 05/03/2021  Does Patient Have a Medical Advance Directive? Yes  Type of Paramedic of [place];Living will;Out of facility DNR (pink MOST or yellow form)  Does patient want to make changes to medical advance directive? No - Patient declined  Copy of [place] in Chart? -  Would patient like information on creating a medical advance directive? -  Pre-existing out of facility DNR order (yellow form or pink MOST form) Yellow form placed in chart (order not valid for inpatient use);Pink MOST form placed in chart (order not valid for inpatient use)     Chief Complaint  Patient presents with   Medical Management of Chronic Issues    Patient returns to the clinic for follow up.     HPI: Patient is a 88 y.o. female seen today for an acute visit for Follow up from discharge from Rehab  Admitted in the hospital from 01/04-01/11 for Acute Hypoxic Respiratory Failure due to  Multi Lobar Pneumonia Then was in Rehab and went home in her Apartment on 01/30   Patient has h/o A. fib on Xarelto s/p AV nodal ablation, s/p PPM in 2009 Aortic stenosis  COPD with frequent exacerbations.  Not on oxygen.  On chronic prednisone.  Follows closely with Dr. [name].  Is enrolled in palliative care Chronic diastolic CHF on high doses of Lasix History of chronic venous insufficiency Vision loss due to macular degeneration and glaucoma  Continues ot have cough but seems at her baseline No SOB Feels weak  Has Hired caregivers 2 hours Twice who are helping with her ADLS with Mild assist Nurses are managing her Meds Cognitively still doing well. Walks with her walker Poor vision and HOH Refused AL recommendations Wt Readings from Last 3 Encounters:  05/09/21 130 lb 6.4 oz (59.1 kg)  05/03/21 125 lb 6.4 oz (56.9 kg)   04/20/21 125 lb 6.4 oz (56.9 kg)     Past Medical History:  Diagnosis Date   Anemia    Aortic stenosis    a. Echo 09/06/12 EF 55-60%, no WMA, G2DD, Ao valve sclerosis w/ mod stenosis, LA mildly dilated, PA pressure 73mmHg   Asthmatic bronchitis    Complete heart block (HCC)    s/p permanent pacemaker 06/27/1999 (Battery change 06/2007 and 2016).  s/p AV nodal ablation by Dr [name] 2016.   COPD (chronic obstructive pulmonary disease) (HCC)    Dr. [name]   DDD (degenerative disc disease)    Diastolic dysfunction    a. Echo 09/06/12 EF 55-60%, no WMA, G2DD, Ao valve sclerosis w/ mod stenosis, LA mildly dilated, PA pressure 58mmHg   Diverticulosis    DVT (deep vein thrombosis) in pregnancy 1954   a. LLE   Hypertension    Hypothyroidism    on medication   Macular degeneration    Dr. [name]   Osteoarthrosis, unspecified whether generalized or localized, other specified sites    PAF (paroxysmal atrial fibrillation) ([place])    Stopped flecainide, on amiodarone but still has bouts of A FIB (mostly in mornings).    Pure hypercholesterolemia    Staghorn calculus    Left    Past Surgical History:  Procedure Laterality Date   APPENDECTOMY  ~ 1941   AV NODE ABLATION  05/10/2014   AV NODE ABLATION N/A 05/10/2014   Procedure: AV NODE ABLATION;  Surgeon:  Thompson Grayer, MD;  Location: [facility] CATH LAB;  Service: Cardiovascular;  Laterality: N/A;   BUNIONECTOMY WITH HAMMERTOE RECONSTRUCTION Bilateral ~ [place]  06/27/99   Medtronic PM implanted by Dr [name]   CARDIOVERSION N/A 12/02/2013   Procedure: CARDIOVERSION;  Surgeon: [name], MD;  Location: [place];  Service: Cardiovascular;  Laterality: N/A;   CATARACT EXTRACTION W/ INTRAOCULAR LENS  IMPLANT, BILATERAL Bilateral    COLONOSCOPY WITH PROPOFOL N/A 04/22/2013   Procedure: COLONOSCOPY WITH PROPOFOL;  Surgeon: [name], MD;  Location: [facility] ENDOSCOPY;  Service: Endoscopy;  Laterality: N/A;   DILATION AND CURETTAGE  OF UTERUS  X 2   "when I was going thru menopause"   ESOPHAGOGASTRODUODENOSCOPY (EGD) WITH PROPOFOL N/A 04/22/2013   Procedure: ESOPHAGOGASTRODUODENOSCOPY (EGD) WITH PROPOFOL;  Surgeon: [name], MD;  Location: [facility] ENDOSCOPY;  Service: Endoscopy;  Laterality: N/A;   HERNIA REPAIR     INCISIONAL HERNIA REPAIR     INSERT / REPLACE / REMOVE PACEMAKER  06/2007   "took out the old; put in new"   INSERT / REPLACE / REMOVE PACEMAKER  05/10/2014   MDT PPM generator change by Dr [name]   JOINT REPLACEMENT     PARTIAL NEPHRECTOMY Left 05/1974   stone disease   PERMANENT PACEMAKER GENERATOR CHANGE N/A 05/10/2014   Procedure: PERMANENT PACEMAKER GENERATOR CHANGE;  Surgeon: Thompson Grayer, MD;  Location: [facility] CATH LAB;  Service: Cardiovascular;  Laterality: N/A;   TONSILLECTOMY AND ADENOIDECTOMY  1930's   TOTAL KNEE ARTHROPLASTY Right 2001    Allergies  Allergen Reactions   Brimonidine Tartrate-Timolol Other (See Comments)    REACTION: systemic malaise amigen eye drop   Penicillins Hives   Breo Ellipta [Fluticasone Furoate-Vilanterol] Other (See Comments)    Ran blood pressure up Increased blood pressure   Vancomycin Other (See Comments)    Red man syndrome - Mild redness and discomfort. Able to complete initial dose.     Outpatient Encounter Medications as of 05/09/2021  Medication Sig   Azelastine HCl 137 MCG/SPRAY SOLN USE 1 TO 2 SPRAYS INTO BOTH NOSTRILS TWICE DAILY.   Cholecalciferol (VITAMIN D) 125 MCG (5000 UT) CAPS Take 1 capsule by mouth daily.   famotidine (PEPCID) 40 MG tablet Take 1 tablet (40 mg total) by mouth at bedtime.   fluticasone (FLONASE) 50 MCG/ACT nasal spray USE 2 SPRAYS EACH NOSTRIL ONCE A DAY AS NEEDED FOR ALLERGIES OR CONGESTION.   furosemide (LASIX) 20 MG tablet Take 20 mg by mouth 2 (two) times a week. Monday and Thursday, along with other dosing listed on medication list   furosemide (LASIX) 40 MG tablet TAKE 1 AND 1/2 TABLETS (60MG ) BY MOUTH ONCE DAILY   gabapentin  (NEURONTIN) 100 MG capsule Take 100 mg by mouth at bedtime.   latanoprost (XALATAN) 0.005 % ophthalmic solution Place 1 drop into both eyes at bedtime.   levothyroxine (SYNTHROID) 75 MCG tablet Take 1 tablet (75 mcg total) by mouth daily.   loratadine (CLARITIN) 10 MG tablet Take 1 tablet (10 mg total) by mouth daily.   losartan (COZAAR) 100 MG tablet Take 100 mg by mouth daily.   Lutein 20 MG CAPS Take 1 capsule (20 mg total) by mouth daily.   Magnesium 250 MG TABS Take 1 tablet by mouth daily.   metoprolol succinate (TOPROL XL) 50 MG 24 hr tablet Take 1 tablet (50 mg total) by mouth daily. Take with or immediately following a meal.   montelukast (SINGULAIR) 10 MG  tablet Take 1 tablet (10 mg total) by mouth daily.   Multiple Vitamins-Minerals (PRESERVISION AREDS 2) CAPS Take 1 capsule by mouth in the morning and at bedtime.    potassium chloride (KLOR-CON M) 10 MEQ tablet Take 20 mEq by mouth daily.   predniSONE (DELTASONE) 5 MG tablet Take 1 tablet (5 mg total) by mouth daily with breakfast.   Rivaroxaban (XARELTO) 15 MG TABS tablet TAKE 1 TABLET BY MOUTH  DAILY WITH SUPPER   SYMBICORT 160-4.5 MCG/ACT inhaler INHALE 2 PUFFS BY MOUTH  INTO THE LUNGS TWO TIMES  DAILY   VENTOLIN HFA 108 (90 Base) MCG/ACT inhaler USE 2 PUFFS EVERY 6 HOURS AS NEEDED FOR SHORTNESS OF BREATH AND WHEEZING.   [DISCONTINUED] levothyroxine (SYNTHROID) 88 MCG tablet Take 75 mcg by mouth daily before breakfast.   [DISCONTINUED] dorzolamide (TRUSOPT) 2 % ophthalmic solution 1 drop 2 (two) times daily.   [DISCONTINUED] guaiFENesin (ROBITUSSIN) 100 MG/5ML liquid Take 5 mLs by mouth every 6 (six) hours as needed for cough or to loosen phlegm.   [DISCONTINUED] ipratropium-albuterol (DUONEB) 0.5-2.5 (3) MG/3ML SOLN Take 3 mLs by nebulization 2 (two) times daily. And every 8 hours as needed   [DISCONTINUED] levothyroxine (SYNTHROID) 75 MCG tablet Take 1 tablet (75 mcg total) by mouth daily before breakfast.   [DISCONTINUED]  sennosides-docusate sodium (SENOKOT-S) 8.6-50 MG tablet Take 2 tablets by mouth at bedtime.   No facility-administered encounter medications on file as of 05/09/2021.    Review of Systems:  Review of Systems  Constitutional:  Negative for activity change and appetite change.  HENT: Negative.    Respiratory:  Positive for cough and shortness of breath.   Cardiovascular:  Positive for leg swelling.  Gastrointestinal:  Positive for constipation.  Genitourinary: Negative.   Musculoskeletal:  Positive for gait problem. Negative for arthralgias and myalgias.  Skin: Negative.   Neurological:  Positive for weakness. Negative for dizziness.  Psychiatric/Behavioral:  Negative for confusion, dysphoric mood and sleep disturbance.    Health Maintenance  Topic Date Due   COVID-19 Vaccine (4 - Booster for Moderna series) 10/13/2020   TETANUS/TDAP  02/23/2029   Pneumonia Vaccine 83+ Years old  Completed   INFLUENZA VACCINE  Completed   Zoster Vaccines- Shingrix  Completed   HPV VACCINES  Aged Out   DEXA SCAN  Discontinued    Physical Exam: Vitals:   05/09/21 1106  BP: (!) 170/100  Pulse: 66  Temp: 97.7 F (36.5 C)  SpO2: 100%  Weight: 130 lb 6.4 oz (59.1 kg)  Height: 5\' 3"  (1.6 m)   Body mass index is 23.1 kg/m. Physical Exam Vitals reviewed.  Constitutional:      Appearance: Normal appearance.  HENT:     Head: Normocephalic.     Nose: Nose normal.     Mouth/Throat:     Mouth: Mucous membranes are moist.     Pharynx: Oropharynx is clear.  Eyes:     Pupils: Pupils are equal, round, and reactive to light.  Cardiovascular:     Rate and Rhythm: Normal rate and regular rhythm.     Pulses: Normal pulses.     Heart sounds: Murmur heard.  Pulmonary:     Effort: Pulmonary effort is normal.     Breath sounds: Normal breath sounds. No wheezing.  Abdominal:     General: Abdomen is flat. Bowel sounds are normal.     Palpations: Abdomen is soft.  Musculoskeletal:     Cervical back:  Neck supple.  Comments: Mild Swelling and has Right LE skin Tear  Skin:    General: Skin is warm.  Neurological:     General: No focal deficit present.     Mental Status: She is alert and oriented to person, place, and time.  Psychiatric:        Mood and Affect: Mood normal.        Thought Content: Thought content normal.    Labs reviewed: Basic Metabolic Panel: Recent Labs    08/22/20 0000 08/30/20 1039 04/12/21 0426 04/13/21 0401 04/14/21 0357 04/17/21 0301 04/18/21 0354 04/23/21 0000  NA 143   < > 137   < > 138 139 141 141  K 3.3*   < > 3.0*   < > 3.9 3.4* 3.8 4.2  CL 101   < > 99   < > 105 108 107 102  CO2 28*   < > 26   < > 26 22 25  31*  GLUCOSE  --    < > 107*   < > 154* 97 83  --   BUN 27*   < > 16   < > 19 24* 30* 23*  CREATININE 1.0   < > 1.03*   < > 0.76 0.84 1.04* 1.0  CALCIUM 9.8   < > 8.9   < > 9.5 8.8* 9.1 9.3  MG  --   --  2.1  --  2.2  --  2.5*  --   TSH 1.20  --  0.317*  --   --   --   --   --    < > = values in this interval not displayed.   Liver Function Tests: Recent Labs    08/22/20 0000 12/21/20 0000 04/11/21 0950  AST 27 24 30   ALT 24 18 37  ALKPHOS 71 72 99  BILITOT  --   --  1.0  PROT  --   --  6.8  ALBUMIN 4.4 4.3 3.6   No results for input(s): LIPASE, AMYLASE in the last 8760 hours. No results for input(s): AMMONIA in the last 8760 hours. CBC: Recent Labs    08/30/20 1039 12/21/20 0000 04/11/21 0950 04/12/21 0426 04/13/21 0401 04/17/21 0301 04/23/21 0000  WBC 9.3   < > 11.7* 13.4* 11.8* 11.5* 10.2  NEUTROABS 7.4  --  9.7* 10.7*  --   --   --   HGB 12.7   < > 11.7* 11.5* 11.1* 12.5 13.2  HCT 39.1   < > 36.7 36.0 35.0* 38.9 42  MCV 97.5  --  98.4 98.4 99.2 97.5  --   PLT 271   < > 271 308 306 407* 355   < > = values in this interval not displayed.   Lipid Panel: No results for input(s): CHOL, HDL, LDLCALC, TRIG, CHOLHDL, LDLDIRECT in the last 8760 hours. Lab Results  Component Value Date   HGBA1C 5.7 (H) 06/25/2017     Procedures since last visit: CT Angio Chest PE W and/or Wo Contrast  Result Date: 04/11/2021 CLINICAL DATA:  Shortness of breath. EXAM: CT ANGIOGRAPHY CHEST WITH CONTRAST TECHNIQUE: Multidetector CT imaging of the chest was performed using the standard protocol during bolus administration of intravenous contrast. Multiplanar CT image reconstructions and MIPs were obtained to evaluate the vascular anatomy. CONTRAST:  75mL OMNIPAQUE IOHEXOL 350 MG/ML SOLN COMPARISON:  October 20, 2006. FINDINGS: Cardiovascular: Satisfactory opacification of the pulmonary arteries to the segmental level. No evidence of pulmonary embolism. Mild cardiomegaly is noted.  Coronary calcifications are noted. No pericardial effusion. Atherosclerosis of thoracic aorta is noted without aneurysm formation. Mediastinum/Nodes: No enlarged mediastinal, hilar, or axillary lymph nodes. Thyroid gland, trachea, and esophagus demonstrate no significant findings. Lungs/Pleura: No pneumothorax is noted. Small bilateral pleural effusions are noted with adjacent sub some atelectasis, left greater than right. Multiple patchy airspace opacities are noted consistent with multifocal pneumonia. Upper Abdomen: No acute abnormality. Musculoskeletal: No chest wall abnormality. No acute or significant osseous findings. Review of the MIP images confirms the above findings. IMPRESSION: No definite evidence of pulmonary embolus. Multiple patchy airspace opacities are noted bilaterally consistent with multifocal pneumonia. Small bilateral pleural effusions are noted with adjacent subsegmental atelectasis, left greater than right. Coronary calcifications are noted suggesting coronary disease. Aortic Atherosclerosis (ICD10-I70.0). Electronically Signed   By: Marijo Conception M.D.   On: 04/11/2021 12:54   DG CHEST PORT 1 VIEW  Result Date: 04/14/2021 CLINICAL DATA:  Shortness of breath. EXAM: PORTABLE CHEST 1 VIEW COMPARISON:  04/11/2021. FINDINGS: Mildly increased  conspicuity of patchy consolidation in the right lung and similar dense left basilar opacity, better characterized on recent CT chest. No visible pneumothorax. Similar small left pleural effusion. Similar cardiomediastinal silhouette, mildly enlarged. Left subclavian approach cardiac rhythm maintenance device. Aortic atherosclerosis. IMPRESSION: 1. Mildly increased conspicuity of patchy consolidation in the right lung and similar dense left basilar opacity, better characterized on recent CT chest. 2. Similar small left pleural effusion. Electronically Signed   By: Margaretha Sheffield M.D.   On: 04/14/2021 10:07   DG Chest Port 1 View  Result Date: 04/11/2021 CLINICAL DATA:  Shortness of breath EXAM: PORTABLE CHEST 1 VIEW COMPARISON:  May 2022 FINDINGS: Ill-defined patchy opacities bilaterally. Probable Kerley B lines. Left lung base pleuroparenchymal opacity. Stable cardiomediastinal contours with mild cardiomegaly. No pneumothorax. IMPRESSION: Ill-defined patchy opacities bilaterally and left basilar opacification. Favor edema given probable Kerley B-lines. Possible small left pleural effusion. Atypical/viral pneumonia is also a consideration. Electronically Signed   By: Macy Mis M.D.   On: 04/11/2021 09:31   ECHOCARDIOGRAM COMPLETE  Result Date: 04/12/2021    ECHOCARDIOGRAM REPORT   Patient Name:   Jody Blake Date of Exam: 04/12/2021 Medical Rec #:  [id number]      Height:       63.0 in Accession #:    [id number]     Weight:       129.2 lb Date of Birth:  02-28-1926      BSA:          1.606 m Patient Age:    63 years       BP:           179/76 mmHg Patient Gender: F              HR:           60 bpm. Exam Location:  Inpatient Procedure: 2D Echo, 3D Echo, Cardiac Doppler and Color Doppler Indications:    I50.40* Unspecified combined systolic (congestive) and diastolic                 (congestive) heart failure  History:        Patient has prior history of Echocardiogram examinations, most                  recent 01/15/2019. Abnormal ECG and Pacemaker, COPD and TIA,                 Aortic Valve Disease, Arrythmias:Atrial Fibrillation,  Signs/Symptoms:Syncope, Shortness of Breath and Dyspnea; Risk                 Factors:Hypertension. Aortic stenosis.  Sonographer:    Roseanna Rainbow RDCS Referring Phys: [place]  Sonographer Comments: Technically difficult study due to poor echo windows. Very low parasternal window. Patient in high fowler's position. IMPRESSIONS  1. Left ventricular ejection fraction, by estimation, is 65 to 70%. The left ventricle has normal function. There is moderate left ventricular hypertrophy. Left ventricular diastolic parameters are indeterminate.  2. Right ventricular systolic function is low normal. The right ventricular size is mildly enlarged. There is severely elevated pulmonary artery systolic pressure.  3. Left atrial size was mildly dilated.  4. Right atrial size was severely dilated.  5. Mild mitral valve regurgitation.  6. Tricuspid valve regurgitation is severe.  7. AV is thickened, calcified with restricted motion Peak and mean gradients through the valve are 52 and 27 mm Hg respectively. Dimensionless index is 0.51. Overall consistent with mild to moderate MR. Compared to echo from October 2020, mean gradient is increased (17 to 27 mm Hg) . The aortic valve is abnormal. Aortic valve regurgitation is mild.  8. The inferior vena cava is normal in size with <50% respiratory variability, suggesting right atrial pressure of 8 mmHg. FINDINGS  Left Ventricle: Left ventricular ejection fraction, by estimation, is 65 to 70%. The left ventricle has normal function. The left ventricular internal cavity size was normal in size. There is moderate left ventricular hypertrophy. Left ventricular diastolic parameters are indeterminate. Right Ventricle: The right ventricular size is mildly enlarged. Right vetricular wall thickness was not assessed. Right ventricular systolic  function is low normal. There is severely elevated pulmonary artery systolic pressure. The tricuspid regurgitant velocity is 3.48 m/s, and with an assumed right atrial pressure of 15 mmHg, the estimated right ventricular systolic pressure is 35.4 mmHg. Left Atrium: Left atrial size was mildly dilated. Right Atrium: Right atrial size was severely dilated. Pericardium: Trivial pericardial effusion is present. Mitral Valve: There is mild thickening of the mitral valve leaflet(s). Mild mitral annular calcification. Mild mitral valve regurgitation. MV peak gradient, 14.6 mmHg. The mean mitral valve gradient is 4.0 mmHg. Tricuspid Valve: The tricuspid valve is normal in structure. Tricuspid valve regurgitation is severe. Aortic Valve: AV is thickened, calcified with restricted motion Peak and mean gradients through the valve are 52 and 27 mm Hg respectively. Dimensionless index is 0.51. Overall consistent with mild to moderate MR. Compared to echo from October 2020, mean  gradient is increased (17 to 27 mm Hg). The aortic valve is abnormal. Aortic valve regurgitation is mild. Aortic valve mean gradient measures 27.2 mmHg. Aortic valve peak gradient measures 52.1 mmHg. Aortic valve area, by VTI measures 1.77 cm. Pulmonic Valve: The pulmonic valve was normal in structure. Pulmonic valve regurgitation is trivial. Aorta: The aortic root and ascending aorta are structurally normal, with no evidence of dilitation. Venous: The inferior vena cava is normal in size with less than 50% respiratory variability, suggesting right atrial pressure of 8 mmHg. IAS/Shunts: The interatrial septum was not assessed. Additional Comments: A device lead is visualized.  LEFT VENTRICLE PLAX 2D LVIDd:         3.80 cm LVIDs:         1.80 cm LV PW:         1.60 cm LV IVS:        1.30 cm LVOT diam:     2.10 cm  3D Volume EF: LV SV:         129         3D EF:        63 % LV SV Index:   80          LV EDV:       91 ml LVOT Area:     3.46 cm    LV  ESV:       34 ml                            LV SV:        58 ml  LV Volumes (MOD) LV vol d, MOD A2C: 49.5 ml LV vol d, MOD A4C: 44.7 ml LV vol s, MOD A2C: 21.9 ml LV vol s, MOD A4C: 10.4 ml LV SV MOD A2C:     27.6 ml LV SV MOD A4C:     44.7 ml LV SV MOD BP:      31.5 ml RIGHT VENTRICLE             IVC RV S prime:     12.40 cm/s  IVC diam: 1.70 cm TAPSE (M-mode): 2.1 cm LEFT ATRIUM             Index        RIGHT ATRIUM           Index LA diam:        4.90 cm 3.05 cm/m   RA Area:     31.60 cm LA Vol (A2C):   99.8 ml 62.15 ml/m  RA Volume:   106.00 ml 66.01 ml/m LA Vol (A4C):   58.9 ml 36.68 ml/m LA Biplane Vol: 79.1 ml 49.26 ml/m  AORTIC VALVE                     PULMONIC VALVE AV Area (Vmax):    2.04 cm      PR End Diast Vel: 1.79 msec AV Area (Vmean):   1.99 cm AV Area (VTI):     1.77 cm AV Vmax:           361.00 cm/s AV Vmean:          238.000 cm/s AV VTI:            0.731 m AV Peak Grad:      52.1 mmHg AV Mean Grad:      27.2 mmHg LVOT Vmax:         213.00 cm/s LVOT Vmean:        137.000 cm/s LVOT VTI:          0.373 m LVOT/AV VTI ratio: 0.51  AORTA Ao Root diam: 3.20 cm Ao Asc diam:  3.60 cm MITRAL VALVE                TRICUSPID VALVE MV Area (PHT): 3.40 cm     TR Peak grad:   48.4 mmHg MV Area VTI:   3.81 cm     TR Vmax:        348.00 cm/s MV Peak grad:  14.6 mmHg MV Mean grad:  4.0 mmHg     SHUNTS MV Vmax:       1.91 m/s     Systemic VTI:  0.37 m MV Vmean:      85.6 cm/s    Systemic Diam: 2.10 cm MV Decel Time: 223 msec MV E velocity: 132.00 cm/s  Dorris Carnes MD Electronically signed by Dorris Carnes MD Signature Date/Time: 04/12/2021/11:55:13 AM    Final     Assessment/Plan  1. Essential hypertension BP elevated today Her Cozaar was changed ot QD  Willl have facility Nurse check her BP at home for next few weeks and still elvated can go up on Toprol    2. Hypothyroidism, unspecified type TSH low in the hospital  Synthyroid was changed but she has not made the change and was on her Older dose I  have made the change for her with New Prescription Repeat TSH in 6 weeks  3. COPD mixed type ([place]) Continue to be Frail On Prednisone and Symbicort  4. Community acquired pneumonia, bilateral Feels Better Still has cough and SOB  5. Permanent atrial fibrillation (HCC) On Xarelto and   6. Chronic diastolic congestive heart failure (HCC) On lasix Follows her weight   7. Complete heart block (HCC) S/p PPM  8. Slow transit constipation Discussed about taking Stool softner and Miralax PRN  9. Stage 3a chronic kidney disease (HCC) Creat stable      Labs/tests ordered:  BMP and TSH on 6 weeks Next appt:  06/25/2021

## 2021-05-09 NOTE — Patient Instructions (Addendum)
[facility] Located in: [facility] Address: [address], [place], [place] [address] Open ? Closes 5?PM Phone: [phone]  Nurse will call you to schedule your 2 week lab appointment.

## 2021-05-10 DIAGNOSIS — J9601 Acute respiratory failure with hypoxia: Secondary | ICD-10-CM | POA: Diagnosis not present

## 2021-05-10 DIAGNOSIS — M6389 Disorders of muscle in diseases classified elsewhere, multiple sites: Secondary | ICD-10-CM | POA: Diagnosis not present

## 2021-05-10 DIAGNOSIS — J17 Pneumonia in diseases classified elsewhere: Secondary | ICD-10-CM | POA: Diagnosis not present

## 2021-05-10 DIAGNOSIS — I5031 Acute diastolic (congestive) heart failure: Secondary | ICD-10-CM | POA: Diagnosis not present

## 2021-05-10 DIAGNOSIS — R278 Other lack of coordination: Secondary | ICD-10-CM | POA: Diagnosis not present

## 2021-05-11 DIAGNOSIS — R278 Other lack of coordination: Secondary | ICD-10-CM | POA: Diagnosis not present

## 2021-05-11 DIAGNOSIS — J17 Pneumonia in diseases classified elsewhere: Secondary | ICD-10-CM | POA: Diagnosis not present

## 2021-05-11 DIAGNOSIS — J9601 Acute respiratory failure with hypoxia: Secondary | ICD-10-CM | POA: Diagnosis not present

## 2021-05-11 DIAGNOSIS — I5031 Acute diastolic (congestive) heart failure: Secondary | ICD-10-CM | POA: Diagnosis not present

## 2021-05-11 DIAGNOSIS — M6389 Disorders of muscle in diseases classified elsewhere, multiple sites: Secondary | ICD-10-CM | POA: Diagnosis not present

## 2021-05-15 DIAGNOSIS — M6389 Disorders of muscle in diseases classified elsewhere, multiple sites: Secondary | ICD-10-CM | POA: Diagnosis not present

## 2021-05-15 DIAGNOSIS — J17 Pneumonia in diseases classified elsewhere: Secondary | ICD-10-CM | POA: Diagnosis not present

## 2021-05-15 DIAGNOSIS — J9601 Acute respiratory failure with hypoxia: Secondary | ICD-10-CM | POA: Diagnosis not present

## 2021-05-15 DIAGNOSIS — R2689 Other abnormalities of gait and mobility: Secondary | ICD-10-CM | POA: Diagnosis not present

## 2021-05-15 DIAGNOSIS — R278 Other lack of coordination: Secondary | ICD-10-CM | POA: Diagnosis not present

## 2021-05-15 DIAGNOSIS — M62561 Muscle wasting and atrophy, not elsewhere classified, right lower leg: Secondary | ICD-10-CM | POA: Diagnosis not present

## 2021-05-15 DIAGNOSIS — I5031 Acute diastolic (congestive) heart failure: Secondary | ICD-10-CM | POA: Diagnosis not present

## 2021-05-15 DIAGNOSIS — M62562 Muscle wasting and atrophy, not elsewhere classified, left lower leg: Secondary | ICD-10-CM | POA: Diagnosis not present

## 2021-05-17 ENCOUNTER — Encounter: Payer: Self-pay | Admitting: Adult Health

## 2021-05-17 ENCOUNTER — Non-Acute Institutional Stay (SKILLED_NURSING_FACILITY): Payer: Medicare Other | Admitting: Adult Health

## 2021-05-17 DIAGNOSIS — I5031 Acute diastolic (congestive) heart failure: Secondary | ICD-10-CM | POA: Diagnosis not present

## 2021-05-17 DIAGNOSIS — H401131 Primary open-angle glaucoma, bilateral, mild stage: Secondary | ICD-10-CM | POA: Diagnosis not present

## 2021-05-17 DIAGNOSIS — I10 Essential (primary) hypertension: Secondary | ICD-10-CM

## 2021-05-17 DIAGNOSIS — J3089 Other allergic rhinitis: Secondary | ICD-10-CM

## 2021-05-17 DIAGNOSIS — J17 Pneumonia in diseases classified elsewhere: Secondary | ICD-10-CM | POA: Diagnosis not present

## 2021-05-17 DIAGNOSIS — E039 Hypothyroidism, unspecified: Secondary | ICD-10-CM

## 2021-05-17 DIAGNOSIS — I442 Atrioventricular block, complete: Secondary | ICD-10-CM | POA: Diagnosis not present

## 2021-05-17 DIAGNOSIS — N1831 Chronic kidney disease, stage 3a: Secondary | ICD-10-CM

## 2021-05-17 DIAGNOSIS — J302 Other seasonal allergic rhinitis: Secondary | ICD-10-CM

## 2021-05-17 DIAGNOSIS — I5032 Chronic diastolic (congestive) heart failure: Secondary | ICD-10-CM | POA: Diagnosis not present

## 2021-05-17 DIAGNOSIS — J441 Chronic obstructive pulmonary disease with (acute) exacerbation: Secondary | ICD-10-CM | POA: Diagnosis not present

## 2021-05-17 DIAGNOSIS — M6389 Disorders of muscle in diseases classified elsewhere, multiple sites: Secondary | ICD-10-CM | POA: Diagnosis not present

## 2021-05-17 DIAGNOSIS — I4821 Permanent atrial fibrillation: Secondary | ICD-10-CM | POA: Diagnosis not present

## 2021-05-17 DIAGNOSIS — J9601 Acute respiratory failure with hypoxia: Secondary | ICD-10-CM | POA: Diagnosis not present

## 2021-05-17 DIAGNOSIS — R278 Other lack of coordination: Secondary | ICD-10-CM | POA: Diagnosis not present

## 2021-05-17 NOTE — Progress Notes (Signed)
Location:  Occupational psychologist of Service:  SNF (31) Provider:   Cindi Carbon, [place] [phone]   [name], MD  Patient Care Team: [name], MD as PCP - General (Internal Medicine) Jerline Pain, MD as PCP - Cardiology (Cardiology) Thompson Grayer, MD as Consulting Physician (Cardiology) Deneise Lever, MD as Consulting Physician (Pulmonary Disease) Rutherford Guys, MD as Consulting Physician (Ophthalmology) [name], MD as Consulting Physician (Gastroenterology) Gaynelle Arabian, MD as Consulting Physician (Orthopedic Surgery)  Extended Emergency Contact Information Primary Emergency Contact: Jody Blake of [place] Phone: [phone] Mobile Phone: [phone] Relation: Daughter Secondary Emergency Contact: Jody Blake of [place] Phone: [phone] Mobile Phone: [phone] Relation: Son  Code Status:  DNR Goals of care: Advanced Directive information Advanced Directives 05/17/2021  Does Patient Have a Medical Advance Directive? Yes  Type of Paramedic of [place];Living will;Out of facility DNR (pink MOST or yellow form)  Does patient want to make changes to medical advance directive? No - Patient declined  Copy of [place] in Chart? -  Would patient like information on creating a medical advance directive? -  Pre-existing out of facility DNR order (yellow form or pink MOST form) Yellow form placed in chart (order not valid for inpatient use);Pink MOST form placed in chart (order not valid for inpatient use)     Chief Complaint  Patient presents with   Acute Visit    Cough, wheeze     HPI:  Pt is a 88 y.o. female seen today for cough and wheezing. PMH significant for hearing loss, CHF, pacemaker, hypothyroid, CKD, afib, rhinitis, GERD, HTN, TIA, chronic bronchitis/COPD, glaucoma  and blindness. Pt reports two day  history of coughing and wheezing with increased sputum production which is yellow. The clinic nurse called to report this as well and we elected to move the [place] to monitor her closely and hopefully prevent a hospitalization. She is mildly short of breath with normal 02 sats. No fevers. She took an extra dose of lasix today as per order. Weight is down 2 lbs. She has edema in her ankles which is unchanged.  Wt Readings from Last 3 Encounters:  05/17/21 128 lb 12.8 oz (58.4 kg)  05/17/21 128 lb 12.8 oz (58.4 kg)  05/09/21 130 lb 6.4 oz (59.1 kg)   She was in the hospital and discharged from in Jan of 2023 with acute resp failure with acute on chronic chf and multifocal pna. She stayed in rehab at [place] and was discharged back [place]. She followed up in the clinic at [facility] on 2/1 and seemed to be in her usual state of health but her bp was up. Subsequent checks in the home were acceptable.    Past Medical History:  Diagnosis Date   Anemia    Aortic stenosis    a. Echo 09/06/12 EF 55-60%, no WMA, G2DD, Ao valve sclerosis w/ mod stenosis, LA mildly dilated, PA pressure 64mmHg   Asthmatic bronchitis    Complete heart block (HCC)    s/p permanent pacemaker 06/27/1999 (Battery change 06/2007 and 2016).  s/p AV nodal ablation by Dr [name] 2016.   COPD (chronic obstructive pulmonary disease) (HCC)    Dr. [name]   DDD (degenerative disc disease)    Diastolic dysfunction    a. Echo 09/06/12 EF 55-60%, no WMA, G2DD, Ao valve sclerosis w/ mod stenosis, LA  mildly dilated, PA pressure 14mmHg   Diverticulosis    DVT (deep vein thrombosis) in pregnancy 1954   a. LLE   Hypertension    Hypothyroidism    on medication   Macular degeneration    Dr. [name]   Osteoarthrosis, unspecified whether generalized or localized, other specified sites    PAF (paroxysmal atrial fibrillation) ([place])    Stopped flecainide, on amiodarone but still has bouts of A FIB (mostly in mornings).    Pure  hypercholesterolemia    Staghorn calculus    Left   Past Surgical History:  Procedure Laterality Date   APPENDECTOMY  ~ 1941   AV NODE ABLATION  05/10/2014   AV NODE ABLATION N/A 05/10/2014   Procedure: AV NODE ABLATION;  Surgeon: Thompson Grayer, MD;  Location: [facility] CATH LAB;  Service: Cardiovascular;  Laterality: N/A;   BUNIONECTOMY WITH HAMMERTOE RECONSTRUCTION Bilateral ~ [place]  06/27/99   Medtronic PM implanted by Dr [name]   CARDIOVERSION N/A 12/02/2013   Procedure: CARDIOVERSION;  Surgeon: [name], MD;  Location: [facility];  Service: Cardiovascular;  Laterality: N/A;   CATARACT EXTRACTION W/ INTRAOCULAR LENS  IMPLANT, BILATERAL Bilateral    COLONOSCOPY WITH PROPOFOL N/A 04/22/2013   Procedure: COLONOSCOPY WITH PROPOFOL;  Surgeon: [name], MD;  Location: [facility] ENDOSCOPY;  Service: Endoscopy;  Laterality: N/A;   DILATION AND CURETTAGE OF UTERUS  X 2   "when I was going thru menopause"   ESOPHAGOGASTRODUODENOSCOPY (EGD) WITH PROPOFOL N/A 04/22/2013   Procedure: ESOPHAGOGASTRODUODENOSCOPY (EGD) WITH PROPOFOL;  Surgeon: [name], MD;  Location: [facility] ENDOSCOPY;  Service: Endoscopy;  Laterality: N/A;   HERNIA REPAIR     INCISIONAL HERNIA REPAIR     INSERT / REPLACE / REMOVE PACEMAKER  06/2007   "took out the old; put in new"   INSERT / REPLACE / REMOVE PACEMAKER  05/10/2014   MDT PPM generator change by Dr [name]   JOINT REPLACEMENT     PARTIAL NEPHRECTOMY Left 05/1974   stone disease   PERMANENT PACEMAKER GENERATOR CHANGE N/A 05/10/2014   Procedure: PERMANENT PACEMAKER GENERATOR CHANGE;  Surgeon: Thompson Grayer, MD;  Location: [facility] CATH LAB;  Service: Cardiovascular;  Laterality: N/A;   TONSILLECTOMY AND ADENOIDECTOMY  1930's   TOTAL KNEE ARTHROPLASTY Right 2001    Allergies  Allergen Reactions   Brimonidine Tartrate-Timolol Other (See Comments)    REACTION: systemic malaise amigen eye drop   Penicillins Hives   Breo Ellipta [Fluticasone  Furoate-Vilanterol] Other (See Comments)    Ran blood pressure up Increased blood pressure   Vancomycin Other (See Comments)    Red man syndrome - Mild redness and discomfort. Able to complete initial dose.     Outpatient Encounter Medications as of 05/17/2021  Medication Sig   Azelastine HCl 137 MCG/SPRAY SOLN USE 1 TO 2 SPRAYS INTO BOTH NOSTRILS TWICE DAILY.   Cholecalciferol (VITAMIN D) 125 MCG (5000 UT) CAPS Take 1 capsule by mouth daily.   dorzolamide (TRUSOPT) 2 % ophthalmic solution 1 drop 2 (two) times daily.   famotidine (PEPCID) 40 MG tablet Take 1 tablet (40 mg total) by mouth at bedtime.   fluticasone (FLONASE) 50 MCG/ACT nasal spray USE 2 SPRAYS EACH NOSTRIL ONCE A DAY AS NEEDED FOR ALLERGIES OR CONGESTION.   furosemide (LASIX) 20 MG tablet Take 20 mg by mouth 2 (two) times a week. Monday and Thursday, along with other dosing listed on medication list   furosemide (LASIX) 40 MG tablet TAKE  1 AND 1/2 TABLETS (60MG ) BY MOUTH ONCE DAILY   gabapentin (NEURONTIN) 100 MG capsule Take 100 mg by mouth at bedtime.   ipratropium-albuterol (DUONEB) 0.5-2.5 (3) MG/3ML SOLN Take 3 mLs by nebulization every 8 (eight) hours as needed.   ipratropium-albuterol (DUONEB) 0.5-2.5 (3) MG/3ML SOLN Take 3 mLs by nebulization 3 (three) times daily.   latanoprost (XALATAN) 0.005 % ophthalmic solution Place 1 drop into both eyes at bedtime.   levothyroxine (SYNTHROID) 75 MCG tablet Take 1 tablet (75 mcg total) by mouth daily.   loratadine (CLARITIN) 10 MG tablet Take 1 tablet (10 mg total) by mouth daily.   losartan (COZAAR) 100 MG tablet Take 100 mg by mouth daily.   Lutein 20 MG CAPS Take 1 capsule (20 mg total) by mouth daily.   Magnesium 250 MG TABS Take 1 tablet by mouth daily.   metoprolol succinate (TOPROL XL) 50 MG 24 hr tablet Take 1 tablet (50 mg total) by mouth daily. Take with or immediately following a meal.   montelukast (SINGULAIR) 10 MG tablet Take 1 tablet (10 mg total) by mouth daily.    Multiple Vitamins-Minerals (PRESERVISION AREDS 2) CAPS Take 1 capsule by mouth in the morning and at bedtime.    potassium chloride (KLOR-CON M) 10 MEQ tablet Take 20 mEq by mouth daily.   predniSONE (DELTASONE) 20 MG tablet Take 40 mg by mouth daily with breakfast. 40 mg qd x 4 days, then 30 mg qd x 3 days, then 20 mg qd x 2 d, then 10 mg qd x 1 then return to 5 mg qd   Rivaroxaban (XARELTO) 15 MG TABS tablet TAKE 1 TABLET BY MOUTH  DAILY WITH SUPPER   sennosides-docusate sodium (SENOKOT-S) 8.6-50 MG tablet Take 2 tablets by mouth at bedtime.   SYMBICORT 160-4.5 MCG/ACT inhaler INHALE 2 PUFFS BY MOUTH  INTO THE LUNGS TWO TIMES  DAILY   VENTOLIN HFA 108 (90 Base) MCG/ACT inhaler USE 2 PUFFS EVERY 6 HOURS AS NEEDED FOR SHORTNESS OF BREATH AND WHEEZING.   No facility-administered encounter medications on file as of 05/17/2021.    Review of Systems  Constitutional:  Negative for activity change, appetite change, chills, diaphoresis, fatigue, fever and unexpected weight change.  HENT:  Negative for congestion.   Respiratory:  Positive for cough, shortness of breath and wheezing. Negative for stridor.   Cardiovascular:  Positive for leg swelling. Negative for chest pain and palpitations.  Gastrointestinal:  Negative for abdominal distention, abdominal pain, constipation and diarrhea.  Genitourinary:  Negative for difficulty urinating and dysuria.  Musculoskeletal:  Negative for arthralgias, back pain, gait problem, joint swelling and myalgias.  Neurological:  Negative for dizziness, tremors, seizures, syncope, facial asymmetry, speech difficulty, weakness, light-headedness, numbness and headaches.  Psychiatric/Behavioral:  Negative for agitation, behavioral problems and confusion.    Immunization History  Administered Date(s) Administered   Influenza Split 01/07/2011   Influenza Whole 01/16/2009, 01/23/2010   Influenza, High Dose Seasonal PF 01/06/2013, 01/15/2019, 02/04/2020   Influenza,inj,Quad  PF,6+ Mos 02/17/2015, 01/30/2018   Influenza,inj,quad, With Preservative 01/06/2017   Influenza-Unspecified 01/06/2014, 02/01/2016, 02/04/2020, 01/26/2021   Moderna SARS-COV2 Booster Vaccination 08/18/2020   Moderna Sars-Covid-2 Vaccination 04/20/2019, 05/18/2019, 02/22/2020   Pneumococcal Conjugate-13 02/24/2019   Pneumococcal Polysaccharide-23 11/23/2013   Tdap 02/24/2019   Zoster Recombinat (Shingrix) 03/13/2017, 05/20/2017   Pertinent  Health Maintenance Due  Topic Date Due   INFLUENZA VACCINE  Completed   DEXA SCAN  Discontinued   Fall Risk 04/16/2021 04/16/2021 04/17/2021 04/17/2021 05/09/2021  Falls  in the past year? - - - - 0  Was there an injury with Fall? - - - - 0  Fall Risk Category Calculator - - - - 0  Fall Risk Category - - - - Low  Patient Fall Risk Level High fall risk High fall risk High fall risk High fall risk Moderate fall risk  Patient at Risk for Falls Due to - - - - No Fall Risks;Impaired balance/gait  Fall risk Follow up - - - - Falls evaluation completed   Functional Status Survey:    Vitals:   05/17/21 1657  BP: 122/65  Pulse: 65  Resp: 16  Temp: 99.3 F (37.4 C)  SpO2: 95%  Weight: 128 lb 12.8 oz (58.4 kg)   Body mass index is 22.82 kg/m. Physical Exam Vitals and nursing note reviewed.  Constitutional:      General: She is not in acute distress.    Appearance: She is not diaphoretic.  HENT:     Head: Normocephalic and atraumatic.     Right Ear: Tympanic membrane normal.     Left Ear: Tympanic membrane normal.     Nose: Nose normal.     Mouth/Throat:     Mouth: Mucous membranes are moist.     Pharynx: Posterior oropharyngeal erythema present.     Comments: White drainage to posterior pharynx Eyes:     Conjunctiva/sclera: Conjunctivae normal.     Pupils: Pupils are equal, round, and reactive to light.  Neck:     Vascular: No JVD.  Cardiovascular:     Rate and Rhythm: Normal rate and regular rhythm.     Heart sounds: No murmur  heard. Pulmonary:     Effort: Pulmonary effort is normal. No respiratory distress.     Breath sounds: Wheezing and rhonchi present.  Abdominal:     General: Abdomen is flat. Bowel sounds are normal.     Palpations: Abdomen is soft.  Musculoskeletal:     Cervical back: No rigidity or tenderness.     Comments: Ankle swelling bilateral +1  Lymphadenopathy:     Cervical: No cervical adenopathy.  Skin:    General: Skin is warm and dry.  Neurological:     Mental Status: She is alert and oriented to person, place, and time.  Psychiatric:        Mood and Affect: Mood normal.    Labs reviewed: Recent Labs    04/12/21 0426 04/13/21 0401 04/14/21 0357 04/17/21 0301 04/18/21 0354 04/23/21 0000  NA 137   < > 138 139 141 141  K 3.0*   < > 3.9 3.4* 3.8 4.2  CL 99   < > 105 108 107 102  CO2 26   < > 26 22 25  31*  GLUCOSE 107*   < > 154* 97 83  --   BUN 16   < > 19 24* 30* 23*  CREATININE 1.03*   < > 0.76 0.84 1.04* 1.0  CALCIUM 8.9   < > 9.5 8.8* 9.1 9.3  MG 2.1  --  2.2  --  2.5*  --    < > = values in this interval not displayed.   Recent Labs    08/22/20 0000 12/21/20 0000 04/11/21 0950  AST 27 24 30   [place] 24 18 37  ALKPHOS 71 72 99  BILITOT  --   --  1.0  PROT  --   --  6.8  ALBUMIN 4.4 4.3 3.6   Recent Labs  08/30/20 1039 12/21/20 0000 04/11/21 0950 04/12/21 0426 04/13/21 0401 04/17/21 0301 04/23/21 0000  WBC 9.3   < > 11.7* 13.4* 11.8* 11.5* 10.2  NEUTROABS 7.4  --  9.7* 10.7*  --   --   --   HGB 12.7   < > 11.7* 11.5* 11.1* 12.5 13.2  HCT 39.1   < > 36.7 36.0 35.0* 38.9 42  MCV 97.5  --  98.4 98.4 99.2 97.5  --   PLT 271   < > 271 308 306 407* 355   < > = values in this interval not displayed.   Lab Results  Component Value Date   TSH 0.317 (L) 04/12/2021   Lab Results  Component Value Date   HGBA1C 5.7 (H) 06/25/2017   Lab Results  Component Value Date   CHOL 138 11/04/2017   HDL 70 11/04/2017   LDLCALC 58 11/04/2017   TRIG 53 11/04/2017    CHOLHDL 3.1 06/25/2017    Significant Diagnostic Results in last 30 days:  CT Angio Chest PE W and/or Wo Contrast  Result Date: 04/11/2021 CLINICAL DATA:  Shortness of breath. EXAM: CT ANGIOGRAPHY CHEST WITH CONTRAST TECHNIQUE: Multidetector CT imaging of the chest was performed using the standard protocol during bolus administration of intravenous contrast. Multiplanar CT image reconstructions and MIPs were obtained to evaluate the vascular anatomy. CONTRAST:  69mL OMNIPAQUE IOHEXOL 350 MG/ML SOLN COMPARISON:  October 20, 2006. FINDINGS: Cardiovascular: Satisfactory opacification of the pulmonary arteries to the segmental level. No evidence of pulmonary embolism. Mild cardiomegaly is noted. Coronary calcifications are noted. No pericardial effusion. Atherosclerosis of thoracic aorta is noted without aneurysm formation. Mediastinum/Nodes: No enlarged mediastinal, hilar, or axillary lymph nodes. Thyroid gland, trachea, and esophagus demonstrate no significant findings. Lungs/Pleura: No pneumothorax is noted. Small bilateral pleural effusions are noted with adjacent sub some atelectasis, left greater than right. Multiple patchy airspace opacities are noted consistent with multifocal pneumonia. Upper Abdomen: No acute abnormality. Musculoskeletal: No chest wall abnormality. No acute or significant osseous findings. Review of the MIP images confirms the above findings. IMPRESSION: No definite evidence of pulmonary embolus. Multiple patchy airspace opacities are noted bilaterally consistent with multifocal pneumonia. Small bilateral pleural effusions are noted with adjacent subsegmental atelectasis, left greater than right. Coronary calcifications are noted suggesting coronary disease. Aortic Atherosclerosis (ICD10-I70.0). Electronically Signed   By: Marijo Conception M.D.   On: 04/11/2021 12:54   DG Chest Port 1 View  Result Date: 04/11/2021 CLINICAL DATA:  Shortness of breath EXAM: PORTABLE CHEST 1 VIEW COMPARISON:   May 2022 FINDINGS: Ill-defined patchy opacities bilaterally. Probable Kerley B lines. Left lung base pleuroparenchymal opacity. Stable cardiomediastinal contours with mild cardiomegaly. No pneumothorax. IMPRESSION: Ill-defined patchy opacities bilaterally and left basilar opacification. Favor edema given probable Kerley B-lines. Possible small left pleural effusion. Atypical/viral pneumonia is also a consideration. Electronically Signed   By: Macy Mis M.D.   On: 04/11/2021 09:31   ECHOCARDIOGRAM COMPLETE  Result Date: 04/12/2021    ECHOCARDIOGRAM REPORT   Patient Name:   Jody Blake Date of Exam: 04/12/2021 Medical Rec #:  [id number]      Height:       63.0 in Accession #:    [id number]     Weight:       129.2 lb Date of Birth:  [birth year]      BSA:          1.606 m Patient Age:    32 years  BP:           179/76 mmHg Patient Gender: F              HR:           60 bpm. Exam Location:  Inpatient Procedure: 2D Echo, 3D Echo, Cardiac Doppler and Color Doppler Indications:    I50.40* Unspecified combined systolic (congestive) and diastolic                 (congestive) heart failure  History:        Patient has prior history of Echocardiogram examinations, most                 recent 01/15/2019. Abnormal ECG and Pacemaker, COPD and TIA,                 Aortic Valve Disease, Arrythmias:Atrial Fibrillation,                 Signs/Symptoms:Syncope, Shortness of Breath and Dyspnea; Risk                 Factors:Hypertension. Aortic stenosis.  Sonographer:    Roseanna Rainbow RDCS Referring Phys: [place]  Sonographer Comments: Technically difficult study due to poor echo windows. Very low parasternal window. Patient in high fowler's position. IMPRESSIONS  1. Left ventricular ejection fraction, by estimation, is 65 to 70%. The left ventricle has normal function. There is moderate left ventricular hypertrophy. Left ventricular diastolic parameters are indeterminate.  2. Right ventricular systolic function  is low normal. The right ventricular size is mildly enlarged. There is severely elevated pulmonary artery systolic pressure.  3. Left atrial size was mildly dilated.  4. Right atrial size was severely dilated.  5. Mild mitral valve regurgitation.  6. Tricuspid valve regurgitation is severe.  7. AV is thickened, calcified with restricted motion Peak and mean gradients through the valve are 52 and 27 mm Hg respectively. Dimensionless index is 0.51. Overall consistent with mild to moderate MR. Compared to echo from October 2020, mean gradient is increased (17 to 27 mm Hg) . The aortic valve is abnormal. Aortic valve regurgitation is mild.  8. The inferior vena cava is normal in size with <50% respiratory variability, suggesting right atrial pressure of 8 mmHg. FINDINGS  Left Ventricle: Left ventricular ejection fraction, by estimation, is 65 to 70%. The left ventricle has normal function. The left ventricular internal cavity size was normal in size. There is moderate left ventricular hypertrophy. Left ventricular diastolic parameters are indeterminate. Right Ventricle: The right ventricular size is mildly enlarged. Right vetricular wall thickness was not assessed. Right ventricular systolic function is low normal. There is severely elevated pulmonary artery systolic pressure. The tricuspid regurgitant velocity is 3.48 m/s, and with an assumed right atrial pressure of 15 mmHg, the estimated right ventricular systolic pressure is 77.4 mmHg. Left Atrium: Left atrial size was mildly dilated. Right Atrium: Right atrial size was severely dilated. Pericardium: Trivial pericardial effusion is present. Mitral Valve: There is mild thickening of the mitral valve leaflet(s). Mild mitral annular calcification. Mild mitral valve regurgitation. MV peak gradient, 14.6 mmHg. The mean mitral valve gradient is 4.0 mmHg. Tricuspid Valve: The tricuspid valve is normal in structure. Tricuspid valve regurgitation is severe. Aortic Valve: AV  is thickened, calcified with restricted motion Peak and mean gradients through the valve are 52 and 27 mm Hg respectively. Dimensionless index is 0.51. Overall consistent with mild to moderate MR. Compared to echo from October 2020, mean  gradient is increased (17 to 27 mm Hg). The aortic valve is abnormal. Aortic valve regurgitation is mild. Aortic valve mean gradient measures 27.2 mmHg. Aortic valve peak gradient measures 52.1 mmHg. Aortic valve area, by VTI measures 1.77 cm. Pulmonic Valve: The pulmonic valve was normal in structure. Pulmonic valve regurgitation is trivial. Aorta: The aortic root and ascending aorta are structurally normal, with no evidence of dilitation. Venous: The inferior vena cava is normal in size with less than 50% respiratory variability, suggesting right atrial pressure of 8 mmHg. IAS/Shunts: The interatrial septum was not assessed. Additional Comments: A device lead is visualized.  LEFT VENTRICLE PLAX 2D LVIDd:         3.80 cm LVIDs:         1.80 cm LV PW:         1.60 cm LV IVS:        1.30 cm LVOT diam:     2.10 cm     3D Volume EF: LV SV:         129         3D EF:        63 % LV SV Index:   80          LV EDV:       91 ml LVOT Area:     3.46 cm    LV ESV:       34 ml                            LV SV:        58 ml  LV Volumes (MOD) LV vol d, MOD A2C: 49.5 ml LV vol d, MOD A4C: 44.7 ml LV vol s, MOD A2C: 21.9 ml LV vol s, MOD A4C: 10.4 ml LV SV MOD A2C:     27.6 ml LV SV MOD A4C:     44.7 ml LV SV MOD BP:      31.5 ml RIGHT VENTRICLE             IVC RV S prime:     12.40 cm/s  IVC diam: 1.70 cm TAPSE (M-mode): 2.1 cm LEFT ATRIUM             Index        RIGHT ATRIUM           Index LA diam:        4.90 cm 3.05 cm/m   RA Area:     31.60 cm LA Vol (A2C):   99.8 ml 62.15 ml/m  RA Volume:   106.00 ml 66.01 ml/m LA Vol (A4C):   58.9 ml 36.68 ml/m LA Biplane Vol: 79.1 ml 49.26 ml/m  AORTIC VALVE                     PULMONIC VALVE AV Area (Vmax):    2.04 cm      PR End Diast Vel:  1.79 msec AV Area (Vmean):   1.99 cm AV Area (VTI):     1.77 cm AV Vmax:           361.00 cm/s AV Vmean:          238.000 cm/s AV VTI:            0.731 m AV Peak Grad:      52.1 mmHg AV Mean Grad:      27.2 mmHg LVOT Vmax:  213.00 cm/s LVOT Vmean:        137.000 cm/s LVOT VTI:          0.373 m LVOT/AV VTI ratio: 0.51  AORTA Ao Root diam: 3.20 cm Ao Asc diam:  3.60 cm MITRAL VALVE                TRICUSPID VALVE MV Area (PHT): 3.40 cm     TR Peak grad:   48.4 mmHg MV Area VTI:   3.81 cm     TR Vmax:        348.00 cm/s MV Peak grad:  14.6 mmHg MV Mean grad:  4.0 mmHg     SHUNTS MV Vmax:       1.91 m/s     Systemic VTI:  0.37 m MV Vmean:      85.6 cm/s    Systemic Diam: 2.10 cm MV Decel Time: 223 msec MV E velocity: 132.00 cm/s Dorris Carnes MD Electronically signed by Dorris Carnes MD Signature Date/Time: 04/12/2021/11:55:13 AM    Final     Assessment/Plan 1. Bronchitis, chronic obstructive, with exacerbation (HCC) Will increase prednisone 40 mg and taper back to 5 mg Add duonebs tid  Add spacer for symbicort to ensure delivery Order CXR  2. Chronic diastolic congestive heart failure ([place]) Does not appear to be in overload, will check xray Weight is down 2 lbs. Unchanged edema Continue current dose of lasix  3. Complete heart block ([place]) S/p pacemaker  4. Essential hypertension Controlled Continue losartan, metoprolol  5. Permanent atrial fibrillation ([place]) Follows with cardiology   6. Seasonal and perennial allergic rhinitis Chronic nasal drip but improved from my last visit Singular, claritin, astelin, and flonase.   7. Acquired hypothyroidism Lab Results  Component Value Date   TSH 0.317 (L) 04/12/2021  Need repeat TSH, dose was adjusted in the hospital   8. Stage 3a chronic kidney disease (HCC) Continue to periodically monitor BMP and avoid nephrotoxic agents  9. Primary open angle glaucoma of both eyes, mild stage Has blindness and needs assistance with med management      Family/ staff Communication: discussed with Ms. Clonch.   Labs/tests ordered:  CBC BMP TSH CXR

## 2021-05-17 NOTE — Progress Notes (Signed)
Location:    [facility] Room Number: [place] of Service:  SNF ([phone]) Provider:  Royal Hawthorn, NP  [name], MD  Patient Care Team: [name], MD as PCP - General (Internal Medicine) Jerline Pain, MD as PCP - Cardiology (Cardiology) Thompson Grayer, MD as Consulting Physician (Cardiology) Deneise Lever, MD as Consulting Physician (Pulmonary Disease) Rutherford Guys, MD as Consulting Physician (Ophthalmology) [name], MD as Consulting Physician (Gastroenterology) Gaynelle Arabian, MD as Consulting Physician (Orthopedic Surgery)  Extended Emergency Contact Information Primary Emergency Contact: [name] of [place] Phone: [phone] Mobile Phone: [phone] Relation: Daughter Secondary Emergency Contact: [name] of [place] Phone: [phone] Mobile Phone: [phone] Relation: Son  Code Status:  DNR Goals of care: Advanced Directive information Advanced Directives 05/17/2021  Does Patient Have a Medical Advance Directive? Yes  Type of Paramedic of [place];Living will;Out of facility DNR (pink MOST or yellow form)  Does patient want to make changes to medical advance directive? No - Patient declined  Copy of [place] in Chart? -  Would patient like information on creating a medical advance directive? -  Pre-existing out of facility DNR order (yellow form or pink MOST form) Yellow form placed in chart (order not valid for inpatient use);Pink MOST form placed in chart (order not valid for inpatient use)     Chief Complaint  Patient presents with   Acute Visit    wheezing    HPI:  Pt is a 88 y.o. female seen today for an acute visit for    Past Medical History:  Diagnosis Date   Anemia    Aortic stenosis    a. Echo 09/06/12 EF 55-60%, no WMA, G2DD, Ao valve sclerosis w/ mod stenosis, LA mildly dilated, PA pressure  76mmHg   Asthmatic bronchitis    Complete heart block (HCC)    s/p permanent pacemaker 06/27/1999 (Battery change 06/2007 and 2016).  s/p AV nodal ablation by Dr [name] 2016.   COPD (chronic obstructive pulmonary disease) (HCC)    Dr. [name]   DDD (degenerative disc disease)    Diastolic dysfunction    a. Echo 09/06/12 EF 55-60%, no WMA, G2DD, Ao valve sclerosis w/ mod stenosis, LA mildly dilated, PA pressure 1mmHg   Diverticulosis    DVT (deep vein thrombosis) in pregnancy 1954   a. LLE   Hypertension    Hypothyroidism    on medication   Macular degeneration    Dr. [name]   Osteoarthrosis, unspecified whether generalized or localized, other specified sites    PAF (paroxysmal atrial fibrillation) ([place])    Stopped flecainide, on amiodarone but still has bouts of A FIB (mostly in mornings).    Pure hypercholesterolemia    Staghorn calculus    Left   Past Surgical History:  Procedure Laterality Date   APPENDECTOMY  ~ 1941   AV NODE ABLATION  05/10/2014   AV NODE ABLATION N/A 05/10/2014   Procedure: AV NODE ABLATION;  Surgeon: Thompson Grayer, MD;  Location: [facility] CATH LAB;  Service: Cardiovascular;  Laterality: N/A;   BUNIONECTOMY WITH HAMMERTOE RECONSTRUCTION Bilateral ~ [place]  06/27/99   Medtronic PM implanted by Dr [name]   CARDIOVERSION N/A 12/02/2013   Procedure: CARDIOVERSION;  Surgeon: [name], MD;  Location: [facility];  Service: Cardiovascular;  Laterality: N/A;   CATARACT EXTRACTION W/ INTRAOCULAR LENS  IMPLANT, BILATERAL Bilateral  COLONOSCOPY WITH PROPOFOL N/A 04/22/2013   Procedure: COLONOSCOPY WITH PROPOFOL;  Surgeon: [name], MD;  Location: [facility] ENDOSCOPY;  Service: Endoscopy;  Laterality: N/A;   DILATION AND CURETTAGE OF UTERUS  X 2   "when I was going thru menopause"   ESOPHAGOGASTRODUODENOSCOPY (EGD) WITH PROPOFOL N/A 04/22/2013   Procedure: ESOPHAGOGASTRODUODENOSCOPY (EGD) WITH PROPOFOL;  Surgeon: [name], MD;  Location: [facility]  ENDOSCOPY;  Service: Endoscopy;  Laterality: N/A;   HERNIA REPAIR     INCISIONAL HERNIA REPAIR     INSERT / REPLACE / REMOVE PACEMAKER  06/2007   "took out the old; put in new"   INSERT / REPLACE / REMOVE PACEMAKER  05/10/2014   MDT PPM generator change by Dr [name]   JOINT REPLACEMENT     PARTIAL NEPHRECTOMY Left 05/1974   stone disease   PERMANENT PACEMAKER GENERATOR CHANGE N/A 05/10/2014   Procedure: PERMANENT PACEMAKER GENERATOR CHANGE;  Surgeon: Thompson Grayer, MD;  Location: [facility] CATH LAB;  Service: Cardiovascular;  Laterality: N/A;   TONSILLECTOMY AND ADENOIDECTOMY  1930's   TOTAL KNEE ARTHROPLASTY Right 2001    Allergies  Allergen Reactions   Brimonidine Tartrate-Timolol Other (See Comments)    REACTION: systemic malaise amigen eye drop   Penicillins Hives   Breo Ellipta [Fluticasone Furoate-Vilanterol] Other (See Comments)    Ran blood pressure up Increased blood pressure   Vancomycin Other (See Comments)    Red man syndrome - Mild redness and discomfort. Able to complete initial dose.     Allergies as of 05/17/2021       Reactions   Brimonidine Tartrate-timolol Other (See Comments)   REACTION: systemic malaise amigen eye drop   Penicillins Hives   Breo Ellipta [fluticasone Furoate-vilanterol] Other (See Comments)   Ran blood pressure up Increased blood pressure   Vancomycin Other (See Comments)   Red man syndrome - Mild redness and discomfort. Able to complete initial dose.         Medication List        Accurate as of May 17, 2021  4:33 PM. If you have any questions, ask your nurse or doctor.          Azelastine HCl 137 MCG/SPRAY Soln USE 1 TO 2 SPRAYS INTO BOTH NOSTRILS TWICE DAILY.   dorzolamide 2 % ophthalmic solution Commonly known as: TRUSOPT 1 drop 2 (two) times daily.   famotidine 40 MG tablet Commonly known as: Pepcid Take 1 tablet (40 mg total) by mouth at bedtime.   fluticasone 50 MCG/ACT nasal spray Commonly known as: FLONASE USE 2  SPRAYS EACH NOSTRIL ONCE A DAY AS NEEDED FOR ALLERGIES OR CONGESTION.   furosemide 20 MG tablet Commonly known as: LASIX Take 20 mg by mouth 2 (two) times a week. Monday and Thursday, along with other dosing listed on medication list   furosemide 40 MG tablet Commonly known as: LASIX TAKE 1 AND 1/2 TABLETS (60MG ) BY MOUTH ONCE DAILY   gabapentin 100 MG capsule Commonly known as: NEURONTIN Take 100 mg by mouth at bedtime.   ipratropium-albuterol 0.5-2.5 (3) MG/3ML Soln Commonly known as: DUONEB Take 3 mLs by nebulization every 8 (eight) hours as needed.   ipratropium-albuterol 0.5-2.5 (3) MG/3ML Soln Commonly known as: DUONEB Take 3 mLs by nebulization 3 (three) times daily.   latanoprost 0.005 % ophthalmic solution Commonly known as: XALATAN Place 1 drop into both eyes at bedtime.   levothyroxine 75 MCG tablet Commonly known as: SYNTHROID Take 1 tablet (75 mcg total) by mouth  daily.   loratadine 10 MG tablet Commonly known as: CLARITIN Take 1 tablet (10 mg total) by mouth daily.   losartan 100 MG tablet Commonly known as: COZAAR Take 100 mg by mouth daily.   Lutein 20 MG Caps Take 1 capsule (20 mg total) by mouth daily.   Magnesium 250 MG Tabs Take 1 tablet by mouth daily.   metoprolol succinate 50 MG 24 hr tablet Commonly known as: Toprol XL Take 1 tablet (50 mg total) by mouth daily. Take with or immediately following a meal.   montelukast 10 MG tablet Commonly known as: SINGULAIR Take 1 tablet (10 mg total) by mouth daily.   potassium chloride 10 MEQ tablet Commonly known as: KLOR-CON M Take 20 mEq by mouth daily.   predniSONE 20 MG tablet Commonly known as: DELTASONE Take 40 mg by mouth daily with breakfast. What changed: Another medication with the same name was removed. Continue taking this medication, and follow the directions you see here. Changed by: Royal Hawthorn, NP   PreserVision AREDS 2 Caps Take 1 capsule by mouth in the morning and at  bedtime.   Rivaroxaban 15 MG Tabs tablet Commonly known as: Xarelto TAKE 1 TABLET BY MOUTH  DAILY WITH SUPPER   sennosides-docusate sodium 8.6-50 MG tablet Commonly known as: SENOKOT-S Take 2 tablets by mouth at bedtime.   Symbicort 160-4.5 MCG/ACT inhaler Generic drug: budesonide-formoterol INHALE 2 PUFFS BY MOUTH  INTO THE LUNGS TWO TIMES  DAILY   Ventolin HFA 108 (90 Base) MCG/ACT inhaler Generic drug: albuterol USE 2 PUFFS EVERY 6 HOURS AS NEEDED FOR SHORTNESS OF BREATH AND WHEEZING.   Vitamin D 125 MCG (5000 UT) Caps Take 1 capsule by mouth daily.        Review of Systems  Immunization History  Administered Date(s) Administered   Influenza Split 01/07/2011   Influenza Whole 01/16/2009, 01/23/2010   Influenza, High Dose Seasonal PF 01/06/2013, 01/15/2019, 02/04/2020   Influenza,inj,Quad PF,6+ Mos 02/17/2015, 01/30/2018   Influenza,inj,quad, With Preservative 01/06/2017   Influenza-Unspecified 01/06/2014, 02/01/2016, 02/04/2020, 01/26/2021   Moderna SARS-COV2 Booster Vaccination 08/18/2020   Moderna Sars-Covid-2 Vaccination 04/20/2019, 05/18/2019, 02/22/2020   Pneumococcal Conjugate-13 02/24/2019   Pneumococcal Polysaccharide-23 11/23/2013   Tdap 02/24/2019   Zoster Recombinat (Shingrix) 03/13/2017, 05/20/2017   Pertinent  Health Maintenance Due  Topic Date Due   INFLUENZA VACCINE  Completed   DEXA SCAN  Discontinued   Fall Risk 04/16/2021 04/16/2021 04/17/2021 04/17/2021 05/09/2021  Falls in the past year? - - - - 0  Was there an injury with Fall? - - - - 0  Fall Risk Category Calculator - - - - 0  Fall Risk Category - - - - Low  Patient Fall Risk Level High fall risk High fall risk High fall risk High fall risk Moderate fall risk  Patient at Risk for Falls Due to - - - - No Fall Risks;Impaired balance/gait  Fall risk Follow up - - - - Falls evaluation completed   Functional Status Survey:    Vitals:   05/17/21 1620  BP: (!) 152/81  Pulse: 87  Resp: 16   Temp: 98.3 F (36.8 C)  SpO2: 97%  Weight: 128 lb 12.8 oz (58.4 kg)  Height: 5\' 3"  (1.6 m)   Body mass index is 22.82 kg/m. Physical Exam  Labs reviewed: Recent Labs    04/12/21 0426 04/13/21 0401 04/14/21 0357 04/17/21 0301 04/18/21 0354 04/23/21 0000  NA 137   < > 138 139 141 141  K 3.0*   < >  3.9 3.4* 3.8 4.2  CL 99   < > 105 108 107 102  CO2 26   < > 26 22 25  31*  GLUCOSE 107*   < > 154* 97 83  --   BUN 16   < > 19 24* 30* 23*  CREATININE 1.03*   < > 0.76 0.84 1.04* 1.0  CALCIUM 8.9   < > 9.5 8.8* 9.1 9.3  MG 2.1  --  2.2  --  2.5*  --    < > = values in this interval not displayed.   Recent Labs    08/22/20 0000 12/21/20 0000 04/11/21 0950  AST 27 24 30   ALT 24 18 37  ALKPHOS 71 72 99  BILITOT  --   --  1.0  PROT  --   --  6.8  ALBUMIN 4.4 4.3 3.6   Recent Labs    08/30/20 1039 12/21/20 0000 04/11/21 0950 04/12/21 0426 04/13/21 0401 04/17/21 0301 04/23/21 0000  WBC 9.3   < > 11.7* 13.4* 11.8* 11.5* 10.2  NEUTROABS 7.4  --  9.7* 10.7*  --   --   --   HGB 12.7   < > 11.7* 11.5* 11.1* 12.5 13.2  HCT 39.1   < > 36.7 36.0 35.0* 38.9 42  MCV 97.5  --  98.4 98.4 99.2 97.5  --   PLT 271   < > 271 308 306 407* 355   < > = values in this interval not displayed.   Lab Results  Component Value Date   TSH 0.317 (L) 04/12/2021   Lab Results  Component Value Date   HGBA1C 5.7 (H) 06/25/2017   Lab Results  Component Value Date   CHOL 138 11/04/2017   HDL 70 11/04/2017   LDLCALC 58 11/04/2017   TRIG 53 11/04/2017   CHOLHDL 3.1 06/25/2017    Significant Diagnostic Results in last 30 days:  No results found.  Assessment/Plan There are no diagnoses linked to this encounter.   Family/ staff Communication:   Labs/tests ordered:   This encounter was created in error - please disregard.

## 2021-05-18 DIAGNOSIS — J209 Acute bronchitis, unspecified: Secondary | ICD-10-CM | POA: Diagnosis not present

## 2021-05-18 DIAGNOSIS — E039 Hypothyroidism, unspecified: Secondary | ICD-10-CM | POA: Diagnosis not present

## 2021-05-18 DIAGNOSIS — R079 Chest pain, unspecified: Secondary | ICD-10-CM | POA: Diagnosis not present

## 2021-05-18 LAB — BASIC METABOLIC PANEL
BUN: 23 — AB (ref 4–21)
CO2: 27 — AB (ref 13–22)
Chloride: 103 (ref 99–108)
Creatinine: 0.9 (ref 0.5–1.1)
Glucose: 146
Potassium: 3.9 (ref 3.4–5.3)
Sodium: 143 (ref 137–147)

## 2021-05-18 LAB — CBC AND DIFFERENTIAL
HCT: 37 (ref 36–46)
Hemoglobin: 11.6 — AB (ref 12.0–16.0)
Platelets: 210 (ref 150–399)
WBC: 5.9

## 2021-05-18 LAB — CBC: RBC: 3.77 — AB (ref 3.87–5.11)

## 2021-05-18 LAB — TSH: TSH: 0.56 (ref 0.41–5.90)

## 2021-05-18 LAB — COMPREHENSIVE METABOLIC PANEL: Calcium: 9.9 (ref 8.7–10.7)

## 2021-05-21 ENCOUNTER — Non-Acute Institutional Stay (SKILLED_NURSING_FACILITY): Payer: Medicare Other | Admitting: Internal Medicine

## 2021-05-21 ENCOUNTER — Encounter: Payer: Self-pay | Admitting: Internal Medicine

## 2021-05-21 DIAGNOSIS — I5032 Chronic diastolic (congestive) heart failure: Secondary | ICD-10-CM

## 2021-05-21 DIAGNOSIS — I4821 Permanent atrial fibrillation: Secondary | ICD-10-CM

## 2021-05-21 DIAGNOSIS — J441 Chronic obstructive pulmonary disease with (acute) exacerbation: Secondary | ICD-10-CM | POA: Diagnosis not present

## 2021-05-21 DIAGNOSIS — I10 Essential (primary) hypertension: Secondary | ICD-10-CM | POA: Diagnosis not present

## 2021-05-21 DIAGNOSIS — N1831 Chronic kidney disease, stage 3a: Secondary | ICD-10-CM

## 2021-05-21 DIAGNOSIS — E039 Hypothyroidism, unspecified: Secondary | ICD-10-CM

## 2021-05-21 NOTE — Progress Notes (Signed)
Provider:  Veleta Miners MD Location:   [place] Room Number: [id number] Place of Service:  SNF ([phone])  PCP: [name], MD Patient Care Team: [name], MD as PCP - General (Internal Medicine) Jerline Pain, MD as PCP - Cardiology (Cardiology) Thompson Grayer, MD as Consulting Physician (Cardiology) Deneise Lever, MD as Consulting Physician (Pulmonary Disease) Rutherford Guys, MD as Consulting Physician (Ophthalmology) [name], MD as Consulting Physician (Gastroenterology) Gaynelle Arabian, MD as Consulting Physician (Orthopedic Surgery)  Extended Emergency Contact Information Primary Emergency Contact: [name] of [place] Phone: [phone] Mobile Phone: [phone] Relation: Daughter Secondary Emergency Contact: [name] of [place] Phone: [phone] Mobile Phone: [phone] Relation: Son  Code Status: DNR Goals of Care: Advanced Directive information Advanced Directives 05/21/2021  Does Patient Have a Medical Advance Directive? Yes  Type of Paramedic of [place];Living will;Out of facility DNR (pink MOST or yellow form)  Does patient want to make changes to medical advance directive? No - Patient declined  Copy of [place] in Chart? -  Would patient like information on creating a medical advance directive? -  Pre-existing out of facility DNR order (yellow form or pink MOST form) Yellow form placed in chart (order not valid for inpatient use);Pink MOST form placed in chart (order not valid for inpatient use)      Chief Complaint  Patient presents with   Readmit To SNF    Admission to rehab.     HPI: Patient is a 88 y.o. female seen today for Readmission to Rehab      Patient has h/o A. fib on Xarelto s/p AV nodal ablation, s/p PPM in 2009 Aortic stenosis  COPD with frequent exacerbations.  Not on oxygen.  On chronic  prednisone.  Follows closely with Dr. [name].  Is enrolled in palliative care Chronic diastolic CHF on high doses of Lasix History of chronic venous insufficiency Vision loss due to macular degeneration and glaucoma  Admitted in the hospital from 01/04-01/11 for Acute Hypoxic Respiratory Failure due to  Multi Lobar Pneumonia Then was in Rehab and went home in her Apartment on 01/30   For past few days having Cough and then it got worse with SOB No Fever or Chills No Chest pain She did initially got better with Prednisone and Nebs But today she is again SOB  Her Chest Xray was negative for any infiltrate. WBC was not elevated But she is coughing Yellow sputum Weight is stable   Past Surgical History:  Procedure Laterality Date   APPENDECTOMY  ~ 1941   AV NODE ABLATION  05/10/2014   AV NODE ABLATION N/A 05/10/2014   Procedure: AV NODE ABLATION;  Surgeon: Thompson Grayer, MD;  Location: [facility] CATH LAB;  Service: Cardiovascular;  Laterality: N/A;   BUNIONECTOMY WITH HAMMERTOE RECONSTRUCTION Bilateral ~ [place]  06/27/99   Medtronic PM implanted by Dr [name]   CARDIOVERSION N/A 12/02/2013   Procedure: CARDIOVERSION;  Surgeon: [name], MD;  Location: [place];  Service: Cardiovascular;  Laterality: N/A;   CATARACT EXTRACTION W/ INTRAOCULAR LENS  IMPLANT, BILATERAL Bilateral    COLONOSCOPY WITH PROPOFOL N/A 04/22/2013   Procedure: COLONOSCOPY WITH PROPOFOL;  Surgeon: [name], MD;  Location: [facility] ENDOSCOPY;  Service: Endoscopy;  Laterality: N/A;   DILATION AND CURETTAGE OF UTERUS  X 2   "when I was going thru menopause"   ESOPHAGOGASTRODUODENOSCOPY (  EGD) WITH PROPOFOL N/A 04/22/2013   Procedure: ESOPHAGOGASTRODUODENOSCOPY (EGD) WITH PROPOFOL;  Surgeon: [name], MD;  Location: [facility] ENDOSCOPY;  Service: Endoscopy;  Laterality: N/A;   HERNIA REPAIR     INCISIONAL HERNIA REPAIR     INSERT / REPLACE / REMOVE PACEMAKER  06/2007   "took out the old; put in  new"   INSERT / REPLACE / REMOVE PACEMAKER  05/10/2014   MDT PPM generator change by Dr [name]   JOINT REPLACEMENT     PARTIAL NEPHRECTOMY Left 05/1974   stone disease   PERMANENT PACEMAKER GENERATOR CHANGE N/A 05/10/2014   Procedure: PERMANENT PACEMAKER GENERATOR CHANGE;  Surgeon: Thompson Grayer, MD;  Location: [facility] C[facility] LAB;  Service: Cardiovascular;  Laterality: N/A;   TONSILLECTOMY AND ADENOIDECTOMY  1930's   TOTAL KNEE ARTHROPLASTY Right 2001    reports that she has never smoked. She has never used smokeless tobacco. She reports current alcohol use of about 7.0 standard drinks per week. She reports that she does not use drugs. Social History   Socioeconomic History   Marital status: Widowed    Spouse name: Not on file   Number of children: 2   Years of education: Not on file   Highest education level: Not on file  Occupational History   Occupation: RETIRED    Employer: RETIRED    Comment: Family [employer] business  Tobacco Use   Smoking status: Never   Smokeless tobacco: Never  Vaping Use   Vaping Use: Never used  Substance and Sexual Activity   Alcohol use: Yes    Alcohol/week: 7.0 standard drinks    Types: 7 Glasses of wine per week    Comment: occasionally   Drug use: No   Sexual activity: Not Currently    Partners: Male  Other Topics Concern   Not on file  Social History Narrative   Diet? Low salt, low fat      Do you drink/eat things with caffeine? yes      Marital status?                 married                   What year were you married? 1949      Do you live in a house, apartment, assisted living, condo, trailer, etc.? apartment      Is it one or more stories? 3 stories      How many persons live in your home? Just me      Do you have any pets in your home? (please list) no      Current or past profession: accounting      Do you exercise?          yes                            Type & how often? Walk, class, prescribed daily      Do you have a living will?  yes      Do you have a DNR form?     yes                             If not, do you want to discuss one?      Do you have signed POA/HPOA for forms? no         Social Determinants of Health  Financial Resource Strain: Low Risk    Difficulty of Paying Living Expenses: Not hard at all  Food Insecurity: No Food Insecurity   Worried About Charity fundraiser in the Last Year: Never true   Ran Out of Food in the Last Year: Never true  Transportation Needs: No Transportation Needs   Lack of Transportation (Medical): No   Lack of Transportation (Non-Medical): No  Physical Activity: Sufficiently Active   Days of Exercise per Week: 7 days   Minutes of Exercise per Session: 30 min  Stress: No Stress Concern Present   Feeling of Stress : Not at all  Social Connections: Moderately Isolated   Frequency of Communication with Friends and Family: More than three times a week   Frequency of Social Gatherings with Friends and Family: More than three times a week   Attends Religious Services: Never   Marine scientist or Organizations: Yes   Attends Music therapist: More than 4 times per year   Marital Status: Widowed  Human resources officer Violence: Not At Risk   Fear of Current or Ex-Partner: No   Emotionally Abused: No   Physically Abused: No   Sexually Abused: No    Functional Status Survey:    Family History  Problem Relation Age of Onset   Malignant hypertension Father    Hypertension Father    Renal Disease Father    Breast cancer Mother    Heart attack Brother    Stroke Brother     Health Maintenance  Topic Date Due   COVID-19 Vaccine (4 - Booster for Moderna series) 10/13/2020   TETANUS/TDAP  02/23/2029   Pneumonia Vaccine 48+ Years old  Completed   INFLUENZA VACCINE  Completed   Zoster Vaccines- Shingrix  Completed   HPV VACCINES  Aged Out   DEXA SCAN  Discontinued    Allergies  Allergen Reactions   Brimonidine Tartrate-Timolol Other (See Comments)     REACTION: systemic malaise amigen eye drop   Penicillins Hives   Breo Ellipta [Fluticasone Furoate-Vilanterol] Other (See Comments)    Ran blood pressure up Increased blood pressure   Vancomycin Other (See Comments)    Red man syndrome - Mild redness and discomfort. Able to complete initial dose.     Allergies as of 05/21/2021       Reactions   Brimonidine Tartrate-timolol Other (See Comments)   REACTION: systemic malaise amigen eye drop   Penicillins Hives   Breo Ellipta [fluticasone Furoate-vilanterol] Other (See Comments)   Ran blood pressure up Increased blood pressure   Vancomycin Other (See Comments)   Red man syndrome - Mild redness and discomfort. Able to complete initial dose.         Medication List        Accurate as of May 21, 2021  9:46 AM. If you have any questions, ask your nurse or doctor.          STOP taking these medications    potassium chloride 10 MEQ tablet Commonly known as: KLOR-CON M Stopped by: [name], MD       TAKE these medications    Azelastine HCl 137 MCG/SPRAY Soln USE 1 TO 2 SPRAYS INTO BOTH NOSTRILS TWICE DAILY.   dorzolamide 2 % ophthalmic solution Commonly known as: TRUSOPT 1 drop 2 (two) times daily.   famotidine 40 MG tablet Commonly known as: Pepcid Take 1 tablet (40 mg total) by mouth at bedtime.   fluticasone 50 MCG/ACT nasal spray Commonly known  as: FLONASE USE 2 SPRAYS EACH NOSTRIL ONCE A DAY AS NEEDED FOR ALLERGIES OR CONGESTION.   furosemide 20 MG tablet Commonly known as: LASIX Take 20 mg by mouth 2 (two) times a week. Monday and Thursday, along with other dosing listed on medication list   furosemide 40 MG tablet Commonly known as: LASIX TAKE 1 AND 1/2 TABLETS (60MG ) BY MOUTH ONCE DAILY   gabapentin 100 MG capsule Commonly known as: NEURONTIN Take 100 mg by mouth at bedtime.   guaiFENesin 600 MG 12 hr tablet Commonly known as: MUCINEX Take 600 mg by mouth 2 (two) times daily.    ipratropium-albuterol 0.5-2.5 (3) MG/3ML Soln Commonly known as: DUONEB Take 3 mLs by nebulization every 8 (eight) hours as needed.   ipratropium-albuterol 0.5-2.5 (3) MG/3ML Soln Commonly known as: DUONEB Take 3 mLs by nebulization 3 (three) times daily.   latanoprost 0.005 % ophthalmic solution Commonly known as: XALATAN Place 1 drop into both eyes at bedtime.   levothyroxine 75 MCG tablet Commonly known as: SYNTHROID Take 1 tablet (75 mcg total) by mouth daily.   loratadine 10 MG tablet Commonly known as: CLARITIN Take 1 tablet (10 mg total) by mouth daily.   losartan 100 MG tablet Commonly known as: COZAAR Take 100 mg by mouth daily.   Lutein 20 MG Caps Take 1 capsule (20 mg total) by mouth daily.   Magnesium 250 MG Tabs Take 1 tablet by mouth daily.   metoprolol succinate 50 MG 24 hr tablet Commonly known as: Toprol XL Take 1 tablet (50 mg total) by mouth daily. Take with or immediately following a meal.   montelukast 10 MG tablet Commonly known as: SINGULAIR Take 1 tablet (10 mg total) by mouth daily.   predniSONE 20 MG tablet Commonly known as: DELTASONE Take 30 mg by mouth daily with breakfast.   PreserVision AREDS 2 Caps Take 1 capsule by mouth in the morning and at bedtime.   Rivaroxaban 15 MG Tabs tablet Commonly known as: Xarelto TAKE 1 TABLET BY MOUTH  DAILY WITH SUPPER   sennosides-docusate sodium 8.6-50 MG tablet Commonly known as: SENOKOT-S Take 2 tablets by mouth at bedtime.   Symbicort 160-4.5 MCG/ACT inhaler Generic drug: budesonide-formoterol INHALE 2 PUFFS BY MOUTH  INTO THE LUNGS TWO TIMES  DAILY   Ventolin HFA 108 (90 Base) MCG/ACT inhaler Generic drug: albuterol USE 2 PUFFS EVERY 6 HOURS AS NEEDED FOR SHORTNESS OF BREATH AND WHEEZING.   Vitamin D 125 MCG (5000 UT) Caps Take 1 capsule by mouth daily.        Review of Systems  Constitutional:  Positive for activity change. Negative for appetite change.  HENT: Negative.     Respiratory:  Positive for cough and shortness of breath.   Cardiovascular:  Positive for leg swelling.  Gastrointestinal:  Negative for constipation.  Genitourinary: Negative.   Musculoskeletal:  Negative for arthralgias, gait problem and myalgias.  Skin: Negative.   Neurological:  Positive for weakness. Negative for dizziness.  Psychiatric/Behavioral:  Negative for confusion, dysphoric mood and sleep disturbance.    Vitals:   05/21/21 0912  Pulse: 83  Resp: 20  Temp: 98 F (36.7 C)  SpO2: 97%  Weight: 126 lb (57.2 kg)  Height: 5\' 3"  (1.6 m)   Body mass index is 22.32 kg/m. Physical Exam Vitals reviewed.  Constitutional:      Appearance: Normal appearance.  HENT:     Head: Normocephalic.     Nose: Nose normal.     Mouth/Throat:  Mouth: Mucous membranes are moist.     Pharynx: Oropharynx is clear.  Eyes:     Pupils: Pupils are equal, round, and reactive to light.  Cardiovascular:     Rate and Rhythm: Normal rate and regular rhythm.     Pulses: Normal pulses.     Heart sounds: Normal heart sounds. No murmur heard. Pulmonary:     Effort: Pulmonary effort is normal.     Breath sounds: Wheezing present.  Abdominal:     General: Abdomen is flat. Bowel sounds are normal.     Palpations: Abdomen is soft.  Musculoskeletal:        General: Swelling present.     Cervical back: Neck supple.     Comments: Mild swelling  Skin:    General: Skin is warm.  Neurological:     General: No focal deficit present.     Mental Status: She is alert and oriented to person, place, and time.  Psychiatric:        Mood and Affect: Mood normal.        Thought Content: Thought content normal.    Labs reviewed: Basic Metabolic Panel: Recent Labs    04/12/21 0426 04/13/21 0401 04/14/21 0357 04/17/21 0301 04/18/21 0354 04/23/21 0000 05/18/21 0000  NA 137   < > 138 139 141 141 143  K 3.0*   < > 3.9 3.4* 3.8 4.2 3.9  CL 99   < > 105 108 107 102 103  CO2 26   < > 26 22 25  31*  27*  GLUCOSE 107*   < > 154* 97 83  --   --   BUN 16   < > 19 24* 30* 23* 23*  CREATININE 1.03*   < > 0.76 0.84 1.04* 1.0 0.9  CALCIUM 8.9   < > 9.5 8.8* 9.1 9.3 9.9  MG 2.1  --  2.2  --  2.5*  --   --    < > = values in this interval not displayed.   Liver Function Tests: Recent Labs    08/22/20 0000 12/21/20 0000 04/11/21 0950  AST 27 24 30   ALT 24 18 37  ALKPHOS 71 72 99  BILITOT  --   --  1.0  PROT  --   --  6.8  ALBUMIN 4.4 4.3 3.6   No results for input(s): LIPASE, AMYLASE in the last 8760 hours. No results for input(s): AMMONIA in the last 8760 hours. CBC: Recent Labs    08/30/20 1039 12/21/20 0000 04/11/21 0950 04/12/21 0426 04/13/21 0401 04/17/21 0301 04/23/21 0000 05/18/21 0000  WBC 9.3   < > 11.7* 13.4* 11.8* 11.5* 10.2 5.9  NEUTROABS 7.4  --  9.7* 10.7*  --   --   --   --   HGB 12.7   < > 11.7* 11.5* 11.1* 12.5 13.2 11.6*  HCT 39.1   < > 36.7 36.0 35.0* 38.9 42 37  MCV 97.5  --  98.4 98.4 99.2 97.5  --   --   PLT 271   < > 271 308 306 407* 355 210   < > = values in this interval not displayed.   Cardiac Enzymes: No results for input(s): CKTOTAL, CKMB, CKMBINDEX, TROPONINI in the last 8760 hours. BNP: Invalid input(s): POCBNP Lab Results  Component Value Date   HGBA1C 5.7 (H) 06/25/2017   Lab Results  Component Value Date   TSH 0.317 (L) 04/12/2021   Lab Results  Component Value Date  JYNWGNFA21 1,904 (H) 04/13/2021   Lab Results  Component Value Date   FOLATE 18.2 04/13/2021   Lab Results  Component Value Date   IRON 36 04/13/2021   TIBC 196 (L) 04/13/2021   FERRITIN 269 04/13/2021    Imaging and Procedures obtained prior to SNF admission: CT Angio Chest PE W and/or Wo Contrast  Result Date: 04/11/2021 CLINICAL DATA:  Shortness of breath. EXAM: CT ANGIOGRAPHY CHEST WITH CONTRAST TECHNIQUE: Multidetector CT imaging of the chest was performed using the standard protocol during bolus administration of intravenous contrast. Multiplanar  CT image reconstructions and MIPs were obtained to evaluate the vascular anatomy. CONTRAST:  71mL OMNIPAQUE IOHEXOL 350 MG/ML SOLN COMPARISON:  October 20, 2006. FINDINGS: Cardiovascular: Satisfactory opacification of the pulmonary arteries to the segmental level. No evidence of pulmonary embolism. Mild cardiomegaly is noted. Coronary calcifications are noted. No pericardial effusion. Atherosclerosis of thoracic aorta is noted without aneurysm formation. Mediastinum/Nodes: No enlarged mediastinal, hilar, or axillary lymph nodes. Thyroid gland, trachea, and esophagus demonstrate no significant findings. Lungs/Pleura: No pneumothorax is noted. Small bilateral pleural effusions are noted with adjacent sub some atelectasis, left greater than right. Multiple patchy airspace opacities are noted consistent with multifocal pneumonia. Upper Abdomen: No acute abnormality. Musculoskeletal: No chest wall abnormality. No acute or significant osseous findings. Review of the MIP images confirms the above findings. IMPRESSION: No definite evidence of pulmonary embolus. Multiple patchy airspace opacities are noted bilaterally consistent with multifocal pneumonia. Small bilateral pleural effusions are noted with adjacent subsegmental atelectasis, left greater than right. Coronary calcifications are noted suggesting coronary disease. Aortic Atherosclerosis (ICD10-I70.0). Electronically Signed   By: Marijo Conception M.D.   On: 04/11/2021 12:54   DG Chest Port 1 View  Result Date: 04/11/2021 CLINICAL DATA:  Shortness of breath EXAM: PORTABLE CHEST 1 VIEW COMPARISON:  May 2022 FINDINGS: Ill-defined patchy opacities bilaterally. Probable Kerley B lines. Left lung base pleuroparenchymal opacity. Stable cardiomediastinal contours with mild cardiomegaly. No pneumothorax. IMPRESSION: Ill-defined patchy opacities bilaterally and left basilar opacification. Favor edema given probable Kerley B-lines. Possible small left pleural effusion.  Atypical/viral pneumonia is also a consideration. Electronically Signed   By: Macy Mis M.D.   On: 04/11/2021 09:31   ECHOCARDIOGRAM COMPLETE  Result Date: 04/12/2021    ECHOCARDIOGRAM REPORT   Patient Name:   Jody Blake Date of Exam: 04/12/2021 Medical Rec #:  [id number]      Height:       63.0 in Accession #:    [id number]     Weight:       129.2 lb Date of Birth:  Jan 28, 1926      BSA:          1.606 m Patient Age:    82 years       BP:           179/76 mmHg Patient Gender: F              HR:           60 bpm. Exam Location:  Inpatient Procedure: 2D Echo, 3D Echo, Cardiac Doppler and Color Doppler Indications:    I50.40* Unspecified combined systolic (congestive) and diastolic                 (congestive) heart failure  History:        Patient has prior history of Echocardiogram examinations, most                 recent 01/15/2019. Abnormal ECG  and Pacemaker, COPD and TIA,                 Aortic Valve Disease, Arrythmias:Atrial Fibrillation,                 Signs/Symptoms:Syncope, Shortness of Breath and Dyspnea; Risk                 Factors:Hypertension. Aortic stenosis.  Sonographer:    Roseanna Rainbow RDCS Referring Phys: [place]  Sonographer Comments: Technically difficult study due to poor echo windows. Very low parasternal window. Patient in high fowler's position. IMPRESSIONS  1. Left ventricular ejection fraction, by estimation, is 65 to 70%. The left ventricle has normal function. There is moderate left ventricular hypertrophy. Left ventricular diastolic parameters are indeterminate.  2. Right ventricular systolic function is low normal. The right ventricular size is mildly enlarged. There is severely elevated pulmonary artery systolic pressure.  3. Left atrial size was mildly dilated.  4. Right atrial size was severely dilated.  5. Mild mitral valve regurgitation.  6. Tricuspid valve regurgitation is severe.  7. AV is thickened, calcified with restricted motion Peak and mean gradients  through the valve are 52 and 27 mm Hg respectively. Dimensionless index is 0.51. Overall consistent with mild to moderate MR. Compared to echo from October 2020, mean gradient is increased (17 to 27 mm Hg) . The aortic valve is abnormal. Aortic valve regurgitation is mild.  8. The inferior vena cava is normal in size with <50% respiratory variability, suggesting right atrial pressure of 8 mmHg. FINDINGS  Left Ventricle: Left ventricular ejection fraction, by estimation, is 65 to 70%. The left ventricle has normal function. The left ventricular internal cavity size was normal in size. There is moderate left ventricular hypertrophy. Left ventricular diastolic parameters are indeterminate. Right Ventricle: The right ventricular size is mildly enlarged. Right vetricular wall thickness was not assessed. Right ventricular systolic function is low normal. There is severely elevated pulmonary artery systolic pressure. The tricuspid regurgitant velocity is 3.48 m/s, and with an assumed right atrial pressure of 15 mmHg, the estimated right ventricular systolic pressure is 54.6 mmHg. Left Atrium: Left atrial size was mildly dilated. Right Atrium: Right atrial size was severely dilated. Pericardium: Trivial pericardial effusion is present. Mitral Valve: There is mild thickening of the mitral valve leaflet(s). Mild mitral annular calcification. Mild mitral valve regurgitation. MV peak gradient, 14.6 mmHg. The mean mitral valve gradient is 4.0 mmHg. Tricuspid Valve: The tricuspid valve is normal in structure. Tricuspid valve regurgitation is severe. Aortic Valve: AV is thickened, calcified with restricted motion Peak and mean gradients through the valve are 52 and 27 mm Hg respectively. Dimensionless index is 0.51. Overall consistent with mild to moderate MR. Compared to echo from October 2020, mean  gradient is increased (17 to 27 mm Hg). The aortic valve is abnormal. Aortic valve regurgitation is mild. Aortic valve mean  gradient measures 27.2 mmHg. Aortic valve peak gradient measures 52.1 mmHg. Aortic valve area, by VTI measures 1.77 cm. Pulmonic Valve: The pulmonic valve was normal in structure. Pulmonic valve regurgitation is trivial. Aorta: The aortic root and ascending aorta are structurally normal, with no evidence of dilitation. Venous: The inferior vena cava is normal in size with less than 50% respiratory variability, suggesting right atrial pressure of 8 mmHg. IAS/Shunts: The interatrial septum was not assessed. Additional Comments: A device lead is visualized.  LEFT VENTRICLE PLAX 2D LVIDd:         3.80 cm LVIDs:  1.80 cm LV PW:         1.60 cm LV IVS:        1.30 cm LVOT diam:     2.10 cm     3D Volume EF: LV SV:         129         3D EF:        63 % LV SV Index:   80          LV EDV:       91 ml LVOT Area:     3.46 cm    LV ESV:       34 ml                            LV SV:        58 ml  LV Volumes (MOD) LV vol d, MOD A2C: 49.5 ml LV vol d, MOD A4C: 44.7 ml LV vol s, MOD A2C: 21.9 ml LV vol s, MOD A4C: 10.4 ml LV SV MOD A2C:     27.6 ml LV SV MOD A4C:     44.7 ml LV SV MOD BP:      31.5 ml RIGHT VENTRICLE             IVC RV S prime:     12.40 cm/s  IVC diam: 1.70 cm TAPSE (M-mode): 2.1 cm LEFT ATRIUM             Index        RIGHT ATRIUM           Index LA diam:        4.90 cm 3.05 cm/m   RA Area:     31.60 cm LA Vol (A2C):   99.8 ml 62.15 ml/m  RA Volume:   106.00 ml 66.01 ml/m LA Vol (A4C):   58.9 ml 36.68 ml/m LA Biplane Vol: 79.1 ml 49.26 ml/m  AORTIC VALVE                     PULMONIC VALVE AV Area (Vmax):    2.04 cm      PR End Diast Vel: 1.79 msec AV Area (Vmean):   1.99 cm AV Area (VTI):     1.77 cm AV Vmax:           361.00 cm/s AV Vmean:          238.000 cm/s AV VTI:            0.731 m AV Peak Grad:      52.1 mmHg AV Mean Grad:      27.2 mmHg LVOT Vmax:         213.00 cm/s LVOT Vmean:        137.000 cm/s LVOT VTI:          0.373 m LVOT/AV VTI ratio: 0.51  AORTA Ao Root diam: 3.20 cm Ao Asc  diam:  3.60 cm MITRAL VALVE                TRICUSPID VALVE MV Area (PHT): 3.40 cm     TR Peak grad:   48.4 mmHg MV Area VTI:   3.81 cm     TR Vmax:        348.00 cm/s MV Peak grad:  14.6 mmHg MV Mean grad:  4.0 mmHg     SHUNTS MV Vmax:  1.91 m/s     Systemic VTI:  0.37 m MV Vmean:      85.6 cm/s    Systemic Diam: 2.10 cm MV Decel Time: 223 msec MV E velocity: 132.00 cm/s Dorris Carnes MD Electronically signed by Dorris Carnes MD Signature Date/Time: 04/12/2021/11:55:13 AM    Final     Assessment/Plan  1. Bronchitis, chronic obstructive, with exacerbation ([place]) Start on Levaquin 500 mg for 5 days Increase Prednisone to 40 mg for few more days and then Taper Continue Duo Nebs Follow up with Dr [name] 2. Chronic diastolic congestive heart failure (HCC) On Lasix Weight stable and Chest Oneal Deputy was normal  3. Essential hypertension BP consistently elevated Change Toprol to 75 mg   4. Permanent atrial fibrillation (HCC) On Toprol and Xarelto  5. Acquired hypothyroidism TSH low  Synthyroid was changed but she has not made the change and was on her Older dose I have made the change for her with New Prescription Repeat TSH in 6 weeks  6. Stage 3a chronic kidney disease ([place]) Creat stable     Family/ staff Communication:   Labs/tests ordered:

## 2021-05-24 ENCOUNTER — Non-Acute Institutional Stay (SKILLED_NURSING_FACILITY): Payer: Medicare Other | Admitting: Adult Health

## 2021-05-24 ENCOUNTER — Encounter: Payer: Self-pay | Admitting: Adult Health

## 2021-05-24 DIAGNOSIS — J310 Chronic rhinitis: Secondary | ICD-10-CM | POA: Diagnosis not present

## 2021-05-24 DIAGNOSIS — I10 Essential (primary) hypertension: Secondary | ICD-10-CM

## 2021-05-24 DIAGNOSIS — I4821 Permanent atrial fibrillation: Secondary | ICD-10-CM

## 2021-05-24 DIAGNOSIS — J441 Chronic obstructive pulmonary disease with (acute) exacerbation: Secondary | ICD-10-CM

## 2021-05-24 DIAGNOSIS — J9601 Acute respiratory failure with hypoxia: Secondary | ICD-10-CM | POA: Diagnosis not present

## 2021-05-24 DIAGNOSIS — E039 Hypothyroidism, unspecified: Secondary | ICD-10-CM | POA: Diagnosis not present

## 2021-05-24 DIAGNOSIS — I5032 Chronic diastolic (congestive) heart failure: Secondary | ICD-10-CM | POA: Diagnosis not present

## 2021-05-24 DIAGNOSIS — K219 Gastro-esophageal reflux disease without esophagitis: Secondary | ICD-10-CM | POA: Diagnosis not present

## 2021-05-24 DIAGNOSIS — R0989 Other specified symptoms and signs involving the circulatory and respiratory systems: Secondary | ICD-10-CM

## 2021-05-24 DIAGNOSIS — R278 Other lack of coordination: Secondary | ICD-10-CM | POA: Diagnosis not present

## 2021-05-24 DIAGNOSIS — I5031 Acute diastolic (congestive) heart failure: Secondary | ICD-10-CM | POA: Diagnosis not present

## 2021-05-24 DIAGNOSIS — J17 Pneumonia in diseases classified elsewhere: Secondary | ICD-10-CM | POA: Diagnosis not present

## 2021-05-24 DIAGNOSIS — M6389 Disorders of muscle in diseases classified elsewhere, multiple sites: Secondary | ICD-10-CM | POA: Diagnosis not present

## 2021-05-24 MED ORDER — DOXYCYCLINE HYCLATE 100 MG PO TABS: 100.0000 mg | ORAL_TABLET | Freq: Two times a day (BID) | ORAL | 0 refills | Status: DC

## 2021-05-24 NOTE — Progress Notes (Addendum)
Location:   [facility] Room Number: [id number] XKGYJ of Service:  SNF ([phone])  Provider: Royal Hawthorn, NP   PCP: [name], MD Patient Care Team: [name], MD as PCP - General (Internal Medicine) Jerline Pain, MD as PCP - Cardiology (Cardiology) Thompson Grayer, MD as Consulting Physician (Cardiology) Deneise Lever, MD as Consulting Physician (Pulmonary Disease) Rutherford Guys, MD as Consulting Physician (Ophthalmology) [name], MD as Consulting Physician (Gastroenterology) Gaynelle Arabian, MD as Consulting Physician (Orthopedic Surgery)  Extended Emergency Contact Information Primary Emergency Contact: [name] of [place] Phone: [phone] Mobile Phone: [phone] Relation: Daughter Secondary Emergency Contact: [name] of [place] Phone: [phone] Mobile Phone: [phone] Relation: Son  Code Status: DNR Goals of care:  Advanced Directive information Advanced Directives 05/24/2021  Does Patient Have a Medical Advance Directive? Yes  Type of Paramedic of [place];Living will;Out of facility DNR (pink MOST or yellow form)  Does patient want to make changes to medical advance directive? No - Patient declined  Copy of [place] in Chart? -  Would patient like information on creating a medical advance directive? -  Pre-existing out of facility DNR order (yellow form or pink MOST form) Yellow form placed in chart (order not valid for inpatient use);Pink MOST form placed in chart (order not valid for inpatient use)     Allergies  Allergen Reactions   Brimonidine Tartrate-Timolol Other (See Comments)    REACTION: systemic malaise amigen eye drop   Penicillins Hives   Breo Ellipta [Fluticasone Furoate-Vilanterol] Other (See Comments)    Ran blood pressure up Increased blood pressure   Vancomycin Other (See Comments)    Red  man syndrome - Mild redness and discomfort. Able to complete initial dose.     Chief Complaint  Patient presents with   Discharge Note    Discharge from SNF    HPI:  88 y.o. female for discharge from [facility] rehab. PMH significant for hearing loss, CHF, pacemaker, hypothyroid, CKD, afib, rhinitis, GERD, HTN, TIA, never smoker chronic bronchitis/COPD, glaucoma  and blindness.  [place] due to increased sob, cough, and wheezing on 05/17/21. Covid swab neg. She was initially given increased steroid dosing and duonebs for acute on chronic bronchitis. She did not require oxygen. CXR 05/17/21 was negative for an acute process. WBC 5.9, Hgb 11.6 BUN 23, C02 27 Cr 0.9 K 3.9 NA 143. Seen on 2/13 with continued cough with yellow sputum and wheezing. Zithromax added and prednisone taper extended.    05/24/2021 Seen for evaluation for discharge. She reports she is still coughing. She has minimal sob with exertion walking in her room and to the BR. She is on room air. She also continues to wheeze. She wants to go home soon. She will have home care to help her. Due to her vision loss she needs help with med management and administration. She has been given a spacer to help with admin of symbicort. She reports a globus sensation after taking synthroid with out water. No throat pain or difficulty eating.  She is not having any increase in weight or leg edema.  Wt Readings from Last 3 Encounters:  05/24/21 125 lb 9.6 oz (57 kg)  05/21/21 126 lb (57.2 kg)  05/17/21 128 lb 12.8 oz (58.4 kg)  Of note she was in the hospital 04/11/21 due to acute hypoxia with resp failure due to  CAP And was treated with antibiotics, steriods and nebs. She also had signs of CHF and was given IV lasix.  BP has been elevated and metoprolol increased during her stay.    Past Medical History:  Diagnosis Date   Anemia    Aortic stenosis    a. Echo 09/06/12 EF 55-60%, no WMA, G2DD, Ao valve sclerosis w/ mod  stenosis, LA mildly dilated, PA pressure 13mmHg   Asthmatic bronchitis    Complete heart block (HCC)    s/p permanent pacemaker 06/27/1999 (Battery change 06/2007 and 2016).  s/p AV nodal ablation by Dr [name] 2016.   COPD (chronic obstructive pulmonary disease) (HCC)    Dr. [name]   DDD (degenerative disc disease)    Diastolic dysfunction    a. Echo 09/06/12 EF 55-60%, no WMA, G2DD, Ao valve sclerosis w/ mod stenosis, LA mildly dilated, PA pressure 18mmHg   Diverticulosis    DVT (deep vein thrombosis) in pregnancy 1954   a. LLE   Hypertension    Hypothyroidism    on medication   Macular degeneration    Dr. [name]   Osteoarthrosis, unspecified whether generalized or localized, other specified sites    PAF (paroxysmal atrial fibrillation) ([place])    Stopped flecainide, on amiodarone but still has bouts of A FIB (mostly in mornings).    Pure hypercholesterolemia    Staghorn calculus    Left    Past Surgical History:  Procedure Laterality Date   APPENDECTOMY  ~ 1941   AV NODE ABLATION  05/10/2014   AV NODE ABLATION N/A 05/10/2014   Procedure: AV NODE ABLATION;  Surgeon: Thompson Grayer, MD;  Location: [facility] CATH LAB;  Service: Cardiovascular;  Laterality: N/A;   BUNIONECTOMY WITH HAMMERTOE RECONSTRUCTION Bilateral ~ [place]  06/27/99   Medtronic PM implanted by Dr [name]   CARDIOVERSION N/A 12/02/2013   Procedure: CARDIOVERSION;  Surgeon: [name], MD;  Location: [facility];  Service: Cardiovascular;  Laterality: N/A;   CATARACT EXTRACTION W/ INTRAOCULAR LENS  IMPLANT, BILATERAL Bilateral    COLONOSCOPY WITH PROPOFOL N/A 04/22/2013   Procedure: COLONOSCOPY WITH PROPOFOL;  Surgeon: [name], MD;  Location: [facility] ENDOSCOPY;  Service: Endoscopy;  Laterality: N/A;   DILATION AND CURETTAGE OF UTERUS  X 2   "when I was going thru menopause"   ESOPHAGOGASTRODUODENOSCOPY (EGD) WITH PROPOFOL N/A 04/22/2013   Procedure: ESOPHAGOGASTRODUODENOSCOPY (EGD) WITH PROPOFOL;   Surgeon: [name], MD;  Location: [facility] ENDOSCOPY;  Service: Endoscopy;  Laterality: N/A;   HERNIA REPAIR     INCISIONAL HERNIA REPAIR     INSERT / REPLACE / REMOVE PACEMAKER  06/2007   "took out the old; put in new"   INSERT / REPLACE / REMOVE PACEMAKER  05/10/2014   MDT PPM generator change by Dr [name]   JOINT REPLACEMENT     PARTIAL NEPHRECTOMY Left 05/1974   stone disease   PERMANENT PACEMAKER GENERATOR CHANGE N/A 05/10/2014   Procedure: PERMANENT PACEMAKER GENERATOR CHANGE;  Surgeon: Thompson Grayer, MD;  Location: [facility] CATH LAB;  Service: Cardiovascular;  Laterality: N/A;   TONSILLECTOMY AND ADENOIDECTOMY  1930's   TOTAL KNEE ARTHROPLASTY Right 2001      reports that she has never smoked. She has never used smokeless tobacco. She reports current alcohol use of about 7.0 standard drinks per week. She reports that she does not use drugs. Social History   Socioeconomic History   Marital status: Widowed    Spouse name: Not on file  Number of children: 2   Years of education: Not on file   Highest education level: Not on file  Occupational History   Occupation: RETIRED    Employer: RETIRED    Comment: Family [employer] business  Tobacco Use   Smoking status: Never   Smokeless tobacco: Never  Vaping Use   Vaping Use: Never used  Substance and Sexual Activity   Alcohol use: Yes    Alcohol/week: 7.0 standard drinks    Types: 7 Glasses of wine per week    Comment: occasionally   Drug use: No   Sexual activity: Not Currently    Partners: Male  Other Topics Concern   Not on file  Social History Narrative   Diet? Low salt, low fat      Do you drink/eat things with caffeine? yes      Marital status?                 married                   What year were you married? 1949      Do you live in a house, apartment, assisted living, condo, trailer, etc.? apartment      Is it one or more stories? 3 stories      How many persons live in your home? Just me      Do you have any pets  in your home? (please list) no      Current or past profession: accounting      Do you exercise?          yes                            Type & how often? Walk, class, prescribed daily      Do you have a living will? yes      Do you have a DNR form?     yes                             If not, do you want to discuss one?      Do you have signed POA/HPOA for forms? no         Social Determinants of Health   Financial Resource Strain: Low Risk    Difficulty of Paying Living Expenses: Not hard at all  Food Insecurity: No Food Insecurity   Worried About Charity fundraiser in the Last Year: Never true   [place] in the Last Year: Never true  Transportation Needs: No Transportation Needs   Lack of Transportation (Medical): No   Lack of Transportation (Non-Medical): No  Physical Activity: Sufficiently Active   Days of Exercise per Week: 7 days   Minutes of Exercise per Session: 30 min  Stress: No Stress Concern Present   Feeling of Stress : Not at all  Social Connections: Moderately Isolated   Frequency of Communication with Friends and Family: More than three times a week   Frequency of Social Gatherings with Friends and Family: More than three times a week   Attends Religious Services: Never   Marine scientist or Organizations: Yes   Attends Music therapist: More than 4 times per year   Marital Status: Widowed  Intimate Partner Violence: Not At Risk   Fear of Current or Ex-Partner: No   Emotionally Abused:  No   Physically Abused: No   Sexually Abused: No   Functional Status Survey:    Allergies  Allergen Reactions   Brimonidine Tartrate-Timolol Other (See Comments)    REACTION: systemic malaise amigen eye drop   Penicillins Hives   Breo Ellipta [Fluticasone Furoate-Vilanterol] Other (See Comments)    Ran blood pressure up Increased blood pressure   Vancomycin Other (See Comments)    Red man syndrome - Mild redness and discomfort. Able to  complete initial dose.     Pertinent  Health Maintenance Due  Topic Date Due   INFLUENZA VACCINE  Completed   DEXA SCAN  Discontinued    Medications: Allergies as of 05/24/2021       Reactions   Brimonidine Tartrate-timolol Other (See Comments)   REACTION: systemic malaise amigen eye drop   Penicillins Hives   Breo Ellipta [fluticasone Furoate-vilanterol] Other (See Comments)   Ran blood pressure up Increased blood pressure   Vancomycin Other (See Comments)   Red man syndrome - Mild redness and discomfort. Able to complete initial dose.         Medication List        Accurate as of May 24, 2021 10:26 AM. If you have any questions, ask your nurse or doctor.          STOP taking these medications    metoprolol succinate 50 MG 24 hr tablet Commonly known as: Toprol XL Stopped by: Royal Hawthorn, NP       TAKE these medications    Azelastine HCl 137 MCG/SPRAY Soln USE 1 TO 2 SPRAYS INTO BOTH NOSTRILS TWICE DAILY.   azithromycin 500 MG tablet Commonly known as: ZITHROMAX Take 500 mg by mouth daily.   azithromycin 250 MG tablet Commonly known as: ZITHROMAX Take 250 mg by mouth daily. Start taking on: May 25, 2021   dorzolamide 2 % ophthalmic solution Commonly known as: TRUSOPT 1 drop 2 (two) times daily.   famotidine 40 MG tablet Commonly known as: Pepcid Take 1 tablet (40 mg total) by mouth at bedtime.   fluticasone 50 MCG/ACT nasal spray Commonly known as: FLONASE USE 2 SPRAYS EACH NOSTRIL ONCE A DAY AS NEEDED FOR ALLERGIES OR CONGESTION.   furosemide 20 MG tablet Commonly known as: LASIX Take 20 mg by mouth 2 (two) times a week. Monday and Thursday, along with other dosing listed on medication list   furosemide 40 MG tablet Commonly known as: LASIX TAKE 1 AND 1/2 TABLETS (60MG ) BY MOUTH ONCE DAILY   gabapentin 100 MG capsule Commonly known as: NEURONTIN Take 100 mg by mouth at bedtime.   ipratropium-albuterol 0.5-2.5 (3) MG/3ML  Soln Commonly known as: DUONEB Take 3 mLs by nebulization every 8 (eight) hours as needed.   ipratropium-albuterol 0.5-2.5 (3) MG/3ML Soln Commonly known as: DUONEB Take 3 mLs by nebulization 3 (three) times daily.   latanoprost 0.005 % ophthalmic solution Commonly known as: XALATAN Place 1 drop into both eyes at bedtime.   levothyroxine 75 MCG tablet Commonly known as: SYNTHROID Take 1 tablet (75 mcg total) by mouth daily.   loratadine 10 MG tablet Commonly known as: CLARITIN Take 1 tablet (10 mg total) by mouth daily.   losartan 100 MG tablet Commonly known as: COZAAR Take 100 mg by mouth daily.   Lutein 20 MG Caps Take 1 capsule (20 mg total) by mouth daily.   Magnesium 250 MG Tabs Take 1 tablet by mouth daily.   Metoprolol Tartrate 75 MG Tabs Take 1 tablet by  mouth in the morning.   montelukast 10 MG tablet Commonly known as: SINGULAIR Take 1 tablet (10 mg total) by mouth daily.   potassium chloride 10 MEQ tablet Commonly known as: KLOR-CON Take 20 mEq by mouth daily.   predniSONE 20 MG tablet Commonly known as: DELTASONE Take 40 mg by mouth daily with breakfast.   predniSONE 20 MG tablet Commonly known as: DELTASONE Take 30 mg by mouth daily with breakfast. Start taking on: May 25, 2021   PreserVision AREDS 2 Caps Take 1 capsule by mouth in the morning and at bedtime.   Rivaroxaban 15 MG Tabs tablet Commonly known as: Xarelto TAKE 1 TABLET BY MOUTH  DAILY WITH SUPPER   sennosides-docusate sodium 8.6-50 MG tablet Commonly known as: SENOKOT-S Take 2 tablets by mouth at bedtime.   Symbicort 160-4.5 MCG/ACT inhaler Generic drug: budesonide-formoterol INHALE 2 PUFFS BY MOUTH  INTO THE LUNGS TWO TIMES  DAILY   Ventolin HFA 108 (90 Base) MCG/ACT inhaler Generic drug: albuterol USE 2 PUFFS EVERY 6 HOURS AS NEEDED FOR SHORTNESS OF BREATH AND WHEEZING.   Vitamin D 125 MCG (5000 UT) Caps Take 1 capsule by mouth daily.        Review of Systems   Constitutional:  Negative for activity change, appetite change, chills, diaphoresis, fatigue, fever and unexpected weight change.  HENT:  Positive for congestion, hearing loss and rhinorrhea. Negative for facial swelling, sinus pressure, sinus pain, sneezing, sore throat and trouble swallowing.        Globus sensation  Eyes:        Blind  Respiratory:  Positive for cough, shortness of breath and wheezing. Negative for choking, chest tightness and stridor.   Cardiovascular:  Negative for chest pain, palpitations and leg swelling (minimal).  Gastrointestinal:  Negative for abdominal distention, abdominal pain, constipation and diarrhea.  Genitourinary:  Negative for difficulty urinating and dysuria.  Musculoskeletal:  Negative for arthralgias, back pain, gait problem, joint swelling and myalgias.  Neurological:  Negative for dizziness, tremors, seizures, syncope, facial asymmetry, speech difficulty, weakness, light-headedness, numbness and headaches.  Psychiatric/Behavioral:  Negative for agitation, behavioral problems and confusion.    Vitals:   05/24/21 0946  BP: (!) 176/89  Pulse: 74  Resp: 18  Temp: 98.3 F (36.8 C)  SpO2: 95%  Weight: 125 lb 9.6 oz (57 kg)  Height: 5\' 3"  (1.6 m)   Body mass index is 22.25 kg/m. Physical Exam Vitals and nursing note reviewed.  Constitutional:      General: She is not in acute distress.    Appearance: She is not diaphoretic.  HENT:     Head: Normocephalic and atraumatic.     Nose: Nose normal. No congestion.     Mouth/Throat:     Mouth: Mucous membranes are moist.     Pharynx: Oropharynx is clear. No oropharyngeal exudate or posterior oropharyngeal erythema.  Neck:     Vascular: No JVD.  Cardiovascular:     Rate and Rhythm: Normal rate and regular rhythm.     Heart sounds: No murmur heard. Pulmonary:     Effort: Pulmonary effort is normal. No respiratory distress.     Breath sounds: Wheezing and rhonchi (scattered) present.  Abdominal:      General: Abdomen is flat. Bowel sounds are normal.     Palpations: Abdomen is soft.  Musculoskeletal:     Cervical back: No rigidity or tenderness.     Comments: Trace edema to both ankles.   Lymphadenopathy:     Cervical:  No cervical adenopathy.  Skin:    General: Skin is warm and dry.  Neurological:     Mental Status: She is alert and oriented to person, place, and time.  Psychiatric:        Mood and Affect: Mood normal.    Labs reviewed: Basic Metabolic Panel: Recent Labs    04/12/21 0426 04/13/21 0401 04/14/21 0357 04/17/21 0301 04/18/21 0354 04/23/21 0000 05/18/21 0000  NA 137   < > 138 139 141 141 143  K 3.0*   < > 3.9 3.4* 3.8 4.2 3.9  CL 99   < > 105 108 107 102 103  CO2 26   < > 26 22 25  31* 27*  GLUCOSE 107*   < > 154* 97 83  --   --   BUN 16   < > 19 24* 30* 23* 23*  CREATININE 1.03*   < > 0.76 0.84 1.04* 1.0 0.9  CALCIUM 8.9   < > 9.5 8.8* 9.1 9.3 9.9  MG 2.1  --  2.2  --  2.5*  --   --    < > = values in this interval not displayed.   Liver Function Tests: Recent Labs    08/22/20 0000 12/21/20 0000 04/11/21 0950  AST 27 24 30   ALT 24 18 37  ALKPHOS 71 72 99  BILITOT  --   --  1.0  PROT  --   --  6.8  ALBUMIN 4.4 4.3 3.6   No results for input(s): LIPASE, AMYLASE in the last 8760 hours. No results for input(s): AMMONIA in the last 8760 hours. CBC: Recent Labs    08/30/20 1039 12/21/20 0000 04/11/21 0950 04/12/21 0426 04/13/21 0401 04/17/21 0301 04/23/21 0000 05/18/21 0000  WBC 9.3   < > 11.7* 13.4* 11.8* 11.5* 10.2 5.9  NEUTROABS 7.4  --  9.7* 10.7*  --   --   --   --   HGB 12.7   < > 11.7* 11.5* 11.1* 12.5 13.2 11.6*  HCT 39.1   < > 36.7 36.0 35.0* 38.9 42 37  MCV 97.5  --  98.4 98.4 99.2 97.5  --   --   PLT 271   < > 271 308 306 407* 355 210   < > = values in this interval not displayed.   Cardiac Enzymes: No results for input(s): CKTOTAL, CKMB, CKMBINDEX, TROPONINI in the last 8760 hours. BNP: Invalid input(s):  POCBNP CBG: No results for input(s): GLUCAP in the last 8760 hours.  Procedures and Imaging Studies During Stay: No results found.  Assessment/Plan:    1. Bronchitis, chronic obstructive, with exacerbation ([place]) Mild illness with some improvement noted.  Will repeat covid swab since she wants to go visit her new born grandson Continues with congested cough and wheezing.  Will d/c zithromax and start doxycycline.  Continues on duonebs three times a day and symbicort. Continue prednisone taper currently  at 40. She is not on oxygen and would really like to go back home with homecare support. If she is doing better by tomorrow or Monday we can send her home with close f/u. She will f/u with pulmonary in the am.   2. Globus sensation Normal oral exam.  Present for 1 day Currently on pepcid 40 mg qhs If not improving could add PPI  3. Gastroesophageal reflux disease without esophagitis As above.   4. Essential hypertension Slight elevation, did come down later in the day Metoprolol increased earlier this week. On  higher doses of prednisone contributing to her HTN. Will recheck at next visit.   5. Permanent atrial fibrillation [facility]) S/p ablation, has pacemaker.  Rate controlled on xarelto  6. Chronic diastolic congestive heart failure (HCC) Weight trending down. Does not appear to be in overload.   7. Acquired hypothyroidism Lab Results  Component Value Date   TSH 0.56 05/18/2021  Continue synthroid  8. Chronic rhinitis  Continues on astelin, claritn,  and singular.

## 2021-05-25 ENCOUNTER — Other Ambulatory Visit: Payer: Self-pay

## 2021-05-25 ENCOUNTER — Ambulatory Visit (INDEPENDENT_AMBULATORY_CARE_PROVIDER_SITE_OTHER): Payer: Medicare Other

## 2021-05-25 ENCOUNTER — Ambulatory Visit (INDEPENDENT_AMBULATORY_CARE_PROVIDER_SITE_OTHER): Payer: Medicare Other | Admitting: Adult Health

## 2021-05-25 ENCOUNTER — Encounter: Payer: Self-pay | Admitting: Adult Health

## 2021-05-25 VITALS — BP 110/60 | HR 88 | Temp 98.1°F | Ht 63.0 in | Wt 124.0 lb

## 2021-05-25 DIAGNOSIS — J449 Chronic obstructive pulmonary disease, unspecified: Secondary | ICD-10-CM

## 2021-05-25 DIAGNOSIS — J189 Pneumonia, unspecified organism: Secondary | ICD-10-CM

## 2021-05-25 DIAGNOSIS — J441 Chronic obstructive pulmonary disease with (acute) exacerbation: Secondary | ICD-10-CM

## 2021-05-25 DIAGNOSIS — J90 Pleural effusion, not elsewhere classified: Secondary | ICD-10-CM | POA: Diagnosis not present

## 2021-05-25 DIAGNOSIS — I5032 Chronic diastolic (congestive) heart failure: Secondary | ICD-10-CM | POA: Diagnosis not present

## 2021-05-25 NOTE — Assessment & Plan Note (Signed)
Chronic bronchitis with slow to resolve exacerbation with recent hospitalization for community-acquired pneumonia. Patient is complete course of antibiotics with doxycycline and prednisone burst.  She is on chronic steroids to resume prednisone 5 mg daily dosing.  Check chest x-ray today. Plan  Patient Instructions  Chest xray today  Finish Doxycycline and prednisone as directed.  Continue on Symbicort 2 puffs Twice daily   Albuterol inhaler As needed   Continue on Duoneb Three times a day  .  Activity as tolerated.  Follow up with Dr. [name] in 6 weeks and As needed   Please contact office for sooner follow up if symptoms do not improve or worsen or seek emergency care

## 2021-05-25 NOTE — Assessment & Plan Note (Signed)
Recent hospitalization last month with community-acquired pneumonia.  Patient was treated with aggressive antibiotics.  She has now again on antibiotics again.  She continues to have ongoing cough and congestion.  We will check chest x-ray.  She is to complete her current course of antibiotics. If symptoms continue we will need to check a sputum culture.  Plan  Patient Instructions  Chest xray today  Finish Doxycycline and prednisone as directed.  Continue on Symbicort 2 puffs Twice daily   Albuterol inhaler As needed   Continue on Duoneb Three times a day  .  Activity as tolerated.  Follow up with Dr. [name] in 6 weeks and As needed   Please contact office for sooner follow up if symptoms do not improve or worsen or seek emergency care

## 2021-05-25 NOTE — Patient Instructions (Addendum)
Chest xray today  Finish Doxycycline and prednisone as directed.  Continue on Symbicort 2 puffs Twice daily   Albuterol inhaler As needed   Continue on Duoneb Three times a day  .  Activity as tolerated.  Follow up with Dr. [name] in 6 weeks and As needed   Please contact office for sooner follow up if symptoms do not improve or worsen or seek emergency care

## 2021-05-25 NOTE — Assessment & Plan Note (Signed)
Appears compensated without evidence of volume overload on exam.  Continue on current regimen 

## 2021-05-25 NOTE — Progress Notes (Signed)
@Patient  ID: Jody Blake, female    DOB: 11-30-1925, 88 y.o.   MRN: [id number]  Chief Complaint  Patient presents with   Acute Visit    Referring provider: [name], MD  HPI: 88 year old female never smoker followed for chronic bronchitis, allergic rhinitis Medical history significant for A-fib on Xarelto, history of complete heart block status post pacemaker Is in independent living apartment Glaucoma and macular degeneration with significant visual impairment  TEST/EVENTS :  2D echo April 12, 2021 EF at 65 to 70%, moderate LVH.  Right ventricular systolic function is normal.  Severely elevated pulmonary artery systolic pressure.  With PAP at 63.4 mmHg  05/25/2021 Follow up : Chronic bronchitis, pneumonia, post hospital follow-up Patient presents for a post hospital follow-up Patient was admitted earlier last month for acute hypoxic respiratory failure secondary to community-acquired pneumonia.  Chest x-ray showed multilobar pneumonia predominantly in the right lower lobe.  She was treated with aggressive IV antibiotics.  Nebulized bronchodilators.  And transitioned over to oral Omnicef.  She also had decompensated diastolic heart failure.  She improved with diuresis.  Patient was discharged to rehab center.  Patient says that she was doing some better but continues to have some ongoing cough and congestion.  She went home briefly but returned back to rehab due to ongoing symptoms of cough and congestion.  She was started on doxycycline and prednisone taper yesterday.  Patient says that cough is about the same.  She denies any fever, hemoptysis, chest pain, orthopnea, edema.  She says she remains weak with low energy. Patient is eating well.  No nausea vomiting or diarrhea. She does want to go back to her apartment soon. She remains on Symbicort twice daily.  Uses DuoNeb 3 times daily.     Allergies  Allergen Reactions   Brimonidine Tartrate-Timolol Other (See Comments)     REACTION: systemic malaise amigen eye drop   Penicillins Hives   Breo Ellipta [Fluticasone Furoate-Vilanterol] Other (See Comments)    Ran blood pressure up Increased blood pressure   Vancomycin Other (See Comments)    Red man syndrome - Mild redness and discomfort. Able to complete initial dose.     Immunization History  Administered Date(s) Administered   Influenza Split 01/07/2011   Influenza Whole 01/16/2009, 01/23/2010   Influenza, High Dose Seasonal PF 01/06/2013, 01/15/2019, 02/04/2020   Influenza,inj,Quad PF,6+ Mos 02/17/2015, 01/30/2018   Influenza,inj,quad, With Preservative 01/06/2017   Influenza-Unspecified 01/06/2014, 02/01/2016, 02/04/2020, 01/26/2021   Moderna SARS-COV2 Booster Vaccination 08/18/2020   Moderna Sars-Covid-2 Vaccination 04/20/2019, 05/18/2019, 02/22/2020   Pneumococcal Conjugate-13 02/24/2019   Pneumococcal Polysaccharide-23 11/23/2013   Tdap 02/24/2019   Zoster Recombinat (Shingrix) 03/13/2017, 05/20/2017    Past Medical History:  Diagnosis Date   Anemia    Aortic stenosis    a. Echo 09/06/12 EF 55-60%, no WMA, G2DD, Ao valve sclerosis w/ mod stenosis, LA mildly dilated, PA pressure 55mmHg   Asthmatic bronchitis    Complete heart block ([place])    s/p permanent pacemaker 06/27/1999 (Battery change 06/2007 and 2016).  s/p AV nodal ablation by Dr [name] 2016.   COPD (chronic obstructive pulmonary disease) (HCC)    Dr. [name]   DDD (degenerative disc disease)    Diastolic dysfunction    a. Echo 09/06/12 EF 55-60%, no WMA, G2DD, Ao valve sclerosis w/ mod stenosis, LA mildly dilated, PA pressure 85mmHg   Diverticulosis    DVT (deep vein thrombosis) in pregnancy 1954   a. LLE   Hypertension  Hypothyroidism    on medication   Macular degeneration    Dr. [name]   Osteoarthrosis, unspecified whether generalized or localized, other specified sites    PAF (paroxysmal atrial fibrillation) ([place])    Stopped flecainide, on amiodarone but still has bouts of A FIB  (mostly in mornings).    Pure hypercholesterolemia    Staghorn calculus    Left    Tobacco History: Social History   Tobacco Use  Smoking Status Never  Smokeless Tobacco Never   Counseling given: Not Answered   Outpatient Medications Prior to Visit  Medication Sig Dispense Refill   Azelastine HCl 137 MCG/SPRAY SOLN USE 1 TO 2 SPRAYS INTO BOTH NOSTRILS TWICE DAILY. 30 mL PRN   Cholecalciferol (VITAMIN D) 125 MCG (5000 UT) CAPS Take 1 capsule by mouth daily.     dorzolamide (TRUSOPT) 2 % ophthalmic solution 1 drop 2 (two) times daily.     doxycycline (VIBRA-TABS) 100 MG tablet Take 1 tablet (100 mg total) by mouth 2 (two) times daily. 14 tablet 0   famotidine (PEPCID) 40 MG tablet Take 1 tablet (40 mg total) by mouth at bedtime. 30 tablet 6   fluticasone (FLONASE) 50 MCG/ACT nasal spray USE 2 SPRAYS EACH NOSTRIL ONCE A DAY AS NEEDED FOR ALLERGIES OR CONGESTION. 16 g PRN   furosemide (LASIX) 20 MG tablet Take 20 mg by mouth 2 (two) times a week. Monday and Thursday, along with other dosing listed on medication list     furosemide (LASIX) 40 MG tablet TAKE 1 AND 1/2 TABLETS (60MG ) BY MOUTH ONCE DAILY 45 tablet 3   gabapentin (NEURONTIN) 100 MG capsule Take 100 mg by mouth at bedtime.     ipratropium-albuterol (DUONEB) 0.5-2.5 (3) MG/3ML SOLN Take 3 mLs by nebulization every 8 (eight) hours as needed.     ipratropium-albuterol (DUONEB) 0.5-2.5 (3) MG/3ML SOLN Take 3 mLs by nebulization 3 (three) times daily.     latanoprost (XALATAN) 0.005 % ophthalmic solution Place 1 drop into both eyes at bedtime.     levothyroxine (SYNTHROID) 75 MCG tablet Take 1 tablet (75 mcg total) by mouth daily. 90 tablet 3   loratadine (CLARITIN) 10 MG tablet Take 1 tablet (10 mg total) by mouth daily. 30 tablet 11   losartan (COZAAR) 100 MG tablet Take 100 mg by mouth daily.     Lutein 20 MG CAPS Take 1 capsule (20 mg total) by mouth daily. 30 capsule 11   Magnesium 250 MG TABS Take 1 tablet by mouth daily.      Metoprolol Tartrate 75 MG TABS Take 1 tablet by mouth in the morning.     montelukast (SINGULAIR) 10 MG tablet Take 1 tablet (10 mg total) by mouth daily. 90 tablet 3   Multiple Vitamins-Minerals (PRESERVISION AREDS 2) CAPS Take 1 capsule by mouth in the morning and at bedtime.      potassium chloride (KLOR-CON) 10 MEQ tablet Take 20 mEq by mouth daily.     predniSONE (DELTASONE) 20 MG tablet Take 30 mg by mouth daily with breakfast.     Rivaroxaban (XARELTO) 15 MG TABS tablet TAKE 1 TABLET BY MOUTH  DAILY WITH SUPPER 10 tablet 1   sennosides-docusate sodium (SENOKOT-S) 8.6-50 MG tablet Take 2 tablets by mouth at bedtime.     SYMBICORT 160-4.5 MCG/ACT inhaler INHALE 2 PUFFS BY MOUTH  INTO THE LUNGS TWO TIMES  DAILY 30.6 g 3   VENTOLIN HFA 108 (90 Base) MCG/ACT inhaler USE 2 PUFFS EVERY 6 HOURS AS  NEEDED FOR SHORTNESS OF BREATH AND WHEEZING. 18 g 0   No facility-administered medications prior to visit.     Review of Systems:   Constitutional:   No  weight loss, night sweats,  Fevers, chills,  +fatigue, or  lassitude.  HEENT:   No headaches,  Difficulty swallowing,  Tooth/dental problems, or  Sore throat,                No sneezing, itching, ear ache, + nasal congestion, post nasal drip,   CV:  No chest pain,  Orthopnea, PND, swelling in lower extremities, anasarca, dizziness, palpitations, syncope.   GI  No heartburn, indigestion, abdominal pain, nausea, vomiting, diarrhea, change in bowel habits, loss of appetite, bloody stools.   Resp:   No chest wall deformity  Skin: no rash or lesions.  GU: no dysuria, change in color of urine, no urgency or frequency.  No flank pain, no hematuria   MS:  No joint pain or swelling.  No decreased range of motion.  No back pain.    Physical Exam  BP 110/60 (BP Location: Left Arm, Patient Position: Sitting, Cuff Size: Normal)    Pulse 88    Temp 98.1 F (36.7 C) (Oral)    Ht 5\' 3"  (1.6 m)    Wt 124 lb (56.2 kg)    LMP 04/08/1974    SpO2 98%     BMI 21.97 kg/m   GEN: A/Ox3; pleasant , NAD, elderly    HEENT:  [place]/AT, NOSE-clear, THROAT-clear, no lesions, no postnasal drip or exudate noted.   NECK:  Supple w/ fair ROM; no JVD; normal carotid impulses w/o bruits; no thyromegaly or nodules palpated; no lymphadenopathy.    RESP few trace rhonchi  no accessory muscle use, no dullness to percussion  CARD:  RRR, no m/r/g, tr peripheral edema, pulses intact, no cyanosis or clubbing.  GI:   Soft & nt; nml bowel sounds; no organomegaly or masses detected.   Musco: Warm bil, no deformities or joint swelling noted.   Neuro: alert, no focal deficits noted.    Skin: Warm, no lesions or rashes    Lab Results:      ProBNP   Imaging: No results found.    No flowsheet data found.  No results found for: NITRICOXIDE      Assessment & Plan:   COPD mixed type ([place]) Chronic bronchitis with slow to resolve exacerbation with recent hospitalization for community-acquired pneumonia. Patient is complete course of antibiotics with doxycycline and prednisone burst.  She is on chronic steroids to resume prednisone 5 mg daily dosing.  Check chest x-ray today. Plan  Patient Instructions  Chest xray today  Finish Doxycycline and prednisone as directed.  Continue on Symbicort 2 puffs Twice daily   Albuterol inhaler As needed   Continue on Duoneb Three times a day  .  Activity as tolerated.  Follow up with Dr. [name] in 6 weeks and As needed   Please contact office for sooner follow up if symptoms do not improve or worsen or seek emergency care       Chronic diastolic congestive heart failure ([place]) Appears compensated without evidence of volume overload on exam.  Continue on current regimen  CAP (community acquired pneumonia) Recent hospitalization last month with community-acquired pneumonia.  Patient was treated with aggressive antibiotics.  She has now again on antibiotics again.  She continues to have ongoing cough and  congestion.  We will check chest x-ray.  She is to complete her  current course of antibiotics. If symptoms continue we will need to check a sputum culture.  Plan  Patient Instructions  Chest xray today  Finish Doxycycline and prednisone as directed.  Continue on Symbicort 2 puffs Twice daily   Albuterol inhaler As needed   Continue on Duoneb Three times a day  .  Activity as tolerated.  Follow up with Dr. [name] in 6 weeks and As needed   Please contact office for sooner follow up if symptoms do not improve or worsen or seek emergency care         Rexene Edison, NP 05/25/2021

## 2021-05-28 DIAGNOSIS — Z780 Asymptomatic menopausal state: Secondary | ICD-10-CM | POA: Diagnosis not present

## 2021-05-28 DIAGNOSIS — M85852 Other specified disorders of bone density and structure, left thigh: Secondary | ICD-10-CM | POA: Diagnosis not present

## 2021-05-28 DIAGNOSIS — M85851 Other specified disorders of bone density and structure, right thigh: Secondary | ICD-10-CM | POA: Diagnosis not present

## 2021-05-29 DIAGNOSIS — J9601 Acute respiratory failure with hypoxia: Secondary | ICD-10-CM | POA: Diagnosis not present

## 2021-05-29 DIAGNOSIS — R278 Other lack of coordination: Secondary | ICD-10-CM | POA: Diagnosis not present

## 2021-05-29 DIAGNOSIS — M6389 Disorders of muscle in diseases classified elsewhere, multiple sites: Secondary | ICD-10-CM | POA: Diagnosis not present

## 2021-05-29 DIAGNOSIS — I5031 Acute diastolic (congestive) heart failure: Secondary | ICD-10-CM | POA: Diagnosis not present

## 2021-05-29 DIAGNOSIS — J17 Pneumonia in diseases classified elsewhere: Secondary | ICD-10-CM | POA: Diagnosis not present

## 2021-05-30 NOTE — Progress Notes (Signed)
Called and spoke with patient, provided results/recommendations per Rexene Edison NP.  She verbalized understanding.

## 2021-06-04 DIAGNOSIS — H401132 Primary open-angle glaucoma, bilateral, moderate stage: Secondary | ICD-10-CM | POA: Diagnosis not present

## 2021-06-05 ENCOUNTER — Telehealth: Payer: Self-pay

## 2021-06-05 NOTE — Telephone Encounter (Signed)
Please let her know that she does have osteopenia.She is not Osteoporotic yet   Will discuss more when she comes for  follow up as she is also on Prednisone. We can discuss her options more when she comes for follow up  Thanks

## 2021-06-05 NOTE — Telephone Encounter (Signed)
Patients personal care aid Shirlean Mylar called with patient in the background to request bone density test results from [facility].   Results are showing in the system under the media tab dated 05/29/2021, however no interpretation has been provided to discuss with patient.   Please advise

## 2021-06-05 NOTE — Telephone Encounter (Signed)
Discussed response with patient and patient verbalized understanding.

## 2021-06-07 ENCOUNTER — Ambulatory Visit (INDEPENDENT_AMBULATORY_CARE_PROVIDER_SITE_OTHER): Payer: Medicare Other

## 2021-06-07 ENCOUNTER — Other Ambulatory Visit: Payer: Self-pay | Admitting: Adult Health

## 2021-06-07 DIAGNOSIS — J441 Chronic obstructive pulmonary disease with (acute) exacerbation: Secondary | ICD-10-CM

## 2021-06-07 DIAGNOSIS — I495 Sick sinus syndrome: Secondary | ICD-10-CM | POA: Diagnosis not present

## 2021-06-07 MED ORDER — LEVALBUTEROL HCL 0.63 MG/3ML IN NEBU: 0.6300 mg | INHALATION_SOLUTION | Freq: Two times a day (BID) | RESPIRATORY_TRACT | 11 refills | Status: DC

## 2021-06-07 MED ORDER — LEVALBUTEROL HCL 0.63 MG/3ML IN NEBU: 0.6300 mg | INHALATION_SOLUTION | Freq: Two times a day (BID) | RESPIRATORY_TRACT | 3 refills | Status: DC | PRN

## 2021-06-07 NOTE — Progress Notes (Signed)
Pharmacy indicates there is a shortage of albuterol. Med changed to xopenex. ?

## 2021-06-08 LAB — CUP PACEART REMOTE DEVICE CHECK
Battery Impedance: 648 Ohm
Battery Remaining Longevity: 83 mo
Battery Voltage: 2.79 V
Brady Statistic RV Percent Paced: 98 %
Date Time Interrogation Session: 20230302114749
Implantable Lead Implant Date: 20090318
Implantable Lead Implant Date: 20090318
Implantable Lead Location: 753859
Implantable Lead Location: 753860
Implantable Lead Model: 5076
Implantable Lead Model: 5092
Implantable Pulse Generator Implant Date: 20160202
Lead Channel Impedance Value: 664 Ohm
Lead Channel Impedance Value: 67 Ohm
Lead Channel Pacing Threshold Amplitude: 1.125 V
Lead Channel Pacing Threshold Pulse Width: 0.4 ms
Lead Channel Setting Pacing Amplitude: 2.5 V
Lead Channel Setting Pacing Pulse Width: 0.4 ms
Lead Channel Setting Sensing Sensitivity: 4 mV

## 2021-06-14 NOTE — Progress Notes (Signed)
Remote pacemaker transmission.   

## 2021-06-18 DIAGNOSIS — Z20822 Contact with and (suspected) exposure to covid-19: Secondary | ICD-10-CM | POA: Diagnosis not present

## 2021-06-19 DIAGNOSIS — I5020 Unspecified systolic (congestive) heart failure: Secondary | ICD-10-CM | POA: Diagnosis not present

## 2021-06-19 LAB — CBC AND DIFFERENTIAL
HCT: 38 (ref 36–46)
Hemoglobin: 12.5 (ref 12.0–16.0)
Platelets: 193 10*3/uL (ref 150–400)
WBC: 5.5

## 2021-06-19 LAB — BASIC METABOLIC PANEL
BUN: 23 — AB (ref 4–21)
CO2: 29 — AB (ref 13–22)
Chloride: 99 (ref 99–108)
Creatinine: 1.2 — AB (ref 0.5–1.1)
Glucose: 91
Potassium: 3.3 mEq/L — AB (ref 3.5–5.1)
Sodium: 136 — AB (ref 137–147)

## 2021-06-19 LAB — HEPATIC FUNCTION PANEL
ALT: 19 U/L (ref 7–35)
AST: 24 (ref 13–35)
Alkaline Phosphatase: 63 (ref 25–125)
Bilirubin, Total: 0.6

## 2021-06-19 LAB — COMPREHENSIVE METABOLIC PANEL
Albumin: 4.2 (ref 3.5–5.0)
Calcium: 9.6 (ref 8.7–10.7)
Globulin: 2.2

## 2021-06-19 LAB — CBC: RBC: 3.91 (ref 3.87–5.11)

## 2021-06-25 ENCOUNTER — Encounter: Payer: Self-pay | Admitting: Adult Health

## 2021-06-25 ENCOUNTER — Ambulatory Visit: Payer: Medicare Other | Admitting: Adult Health

## 2021-06-25 ENCOUNTER — Encounter: Payer: Medicare Other | Admitting: Adult Health

## 2021-06-25 ENCOUNTER — Other Ambulatory Visit: Payer: Self-pay

## 2021-06-25 VITALS — BP 182/92 | HR 67 | Temp 97.7°F | Ht 63.0 in | Wt 133.0 lb

## 2021-06-25 DIAGNOSIS — I10 Essential (primary) hypertension: Secondary | ICD-10-CM

## 2021-06-25 DIAGNOSIS — I5031 Acute diastolic (congestive) heart failure: Secondary | ICD-10-CM | POA: Diagnosis not present

## 2021-06-25 MED ORDER — TORSEMIDE 40 MG PO TABS: 40.0000 mg | ORAL_TABLET | Freq: Every day | ORAL | 1 refills | Status: DC

## 2021-06-25 NOTE — Progress Notes (Addendum)
? ?[facility] ? ?POS: clinic ? ?Provider:  ?Cindi Carbon, ANP ?[place] ?[phone] ? ? ?Code Status: DNR ?Goals of Care:  ?Advanced Directives 05/24/2021  ?Does Patient Have a Medical Advance Directive? Yes  ?Type of Paramedic of [place];Living will;Out of facility DNR (pink MOST or yellow form)  ?Does patient want to make changes to medical advance directive? No - Patient declined  ?Copy of [place] in Chart? -  ?Would patient like information on creating a medical advance directive? -  ?Pre-existing out of facility DNR order (yellow form or pink MOST form) Yellow form placed in chart (order not valid for inpatient use);Pink MOST form placed in chart (order not valid for inpatient use)  ? ? ? ?Chief Complaint  ?Patient presents with  ? Acute Visit  ?  Weight gain  ? ? ?HPI: Patient is a 88 y.o. female seen today for an acute visit for weight gain and sob with exertion. Weight has trended upward by 8 lbs. She feels some sob on exertion. Bp is going up as well. Legs feel more swollen. Has had to use rescue inhaler a few times.  ?Wt Readings from Last 3 Encounters:  ?06/25/21 133 lb (60.3 kg)  ?05/25/21 124 lb (56.2 kg)  ?05/24/21 125 lb 9.6 oz (57 kg)  ? ?BP 182/92 at recheck  ? ?Past Medical History:  ?Diagnosis Date  ? Anemia   ? Aortic stenosis   ? a. Echo 09/06/12 EF 55-60%, no WMA, G2DD, Ao valve sclerosis w/ mod stenosis, LA mildly dilated, PA pressure 61mHg  ? Asthmatic bronchitis   ? [facility] heart block ([facility]   ? s/p permanent pacemaker 06/27/1999 (Battery change 06/2007 and 2016).  s/p AV nodal ablation by Dr ARayann Heman2016.  ? COPD (chronic obstructive pulmonary disease) ([place]   ? Dr. YAnnamaria Boots ? DDD (degenerative disc disease)   ? Diastolic dysfunction   ? a. Echo 09/06/12 EF 55-60%, no WMA, G2DD, Ao valve sclerosis w/ mod stenosis, LA mildly dilated, PA pressure 446mg  ? Diverticulosis   ? DVT (deep vein thrombosis) in pregnancy 1954  ? a. LLE  ?  Hypertension   ? Hypothyroidism   ? on medication  ? Macular degeneration   ? Dr. KuEliezer Bottom? Osteoarthrosis, unspecified whether generalized or localized, other specified sites   ? [place] fibrillation) [place]  ? Stopped flecainide, on amiodarone but still has bouts of A FIB (mostly in mornings).   ? Pure hypercholesterolemia   ? Staghorn calculus   ? Left  ? ? ?Past Surgical History:  ?Procedure Laterality Date  ? APPENDECTOMY  ~ 1941  ? AV NODE ABLATION  05/10/2014  ? AV NODE ABLATION N/A 05/10/2014  ? Procedure: AV NODE ABLATION;  Surgeon: JaThompson GrayerMD;  [facility];  Service: Cardiovascular;  Laterality: N/A;  ? BUNIONECTOMY WITH HAMMERTOE RECONSTRUCTION Bilateral ~ 1990  ? CARDIAC PACEMAKER PLACEMENT  06/27/99  ? Medtronic PM implanted by Dr EdLeonia Reeves? CARDIOVERSION N/A 12/02/2013  ? Procedure: CARDIOVERSION;  Surgeon: PaFay RecordsMD;  [facility];  Service: Cardiovascular;  Laterality: N/A;  ? CATARACT EXTRACTION W/ INTRAOCULAR LENS  IMPLANT, BILATERAL Bilateral   ? COLONOSCOPY WITH PROPOFOL N/A 04/22/2013  ? Procedure: COLONOSCOPY WITH PROPOFOL;  Surgeon: MaGarlan FairMD;  Location: [facility] ENDOSCOPY;  Service: Endoscopy;  Laterality: N/A;  ? DILATION AND CURETTAGE OF UTERUS  X 2  ? "when I was going thru  menopause"  ? ESOPHAGOGASTRODUODENOSCOPY (EGD) WITH PROPOFOL N/A 04/22/2013  ? Procedure: ESOPHAGOGASTRODUODENOSCOPY (EGD) WITH PROPOFOL;  Surgeon: [name], MD;  Location: [facility] ENDOSCOPY;  Service: Endoscopy;  Laterality: N/A;  ? HERNIA REPAIR    ? INCISIONAL HERNIA REPAIR    ? INSERT / REPLACE / REMOVE PACEMAKER  06/2007  ? "took out the old; put in new"  ? INSERT / REPLACE / REMOVE PACEMAKER  05/10/2014  ? MDT PPM generator change by Dr [name]  ? JOINT REPLACEMENT    ? PARTIAL NEPHRECTOMY Left 05/1974  ? stone disease  ? PERMANENT PACEMAKER GENERATOR CHANGE N/A 05/10/2014  ? Procedure: PERMANENT PACEMAKER GENERATOR CHANGE;  Surgeon: [place] Grayer, MD;  Location: [facility] CATH LAB;   Service: Cardiovascular;  Laterality: N/A;  ? TONSILLECTOMY AND ADENOIDECTOMY  1930's  ? TOTAL KNEE ARTHROPLASTY Right 2001  ? ? ?Allergies  ?Allergen Reactions  ? Brimonidine Tartrate-Timolol Other (See Comments)  ?  REACTION: systemic malaise amigen eye drop  ? Penicillins Hives  ? Breo Ellipta [Fluticasone Furoate-Vilanterol] Other (See Comments)  ?  Ran blood pressure up ?Increased blood pressure  ? Vancomycin Other (See Comments)  ?  Red man syndrome - Mild redness and discomfort. Able to complete initial dose.   ? ? ?Outpatient Encounter Medications as of 06/25/2021  ?Medication Sig  ? albuterol (VENTOLIN HFA) 108 (90 Base) MCG/ACT inhaler Inhale 2 puffs into the lungs every 6 (six) hours as needed for wheezing or shortness of breath.  ? Azelastine HCl 137 MCG/SPRAY SOLN USE 1 TO 2 SPRAYS INTO BOTH NOSTRILS TWICE DAILY.  ? Cholecalciferol (VITAMIN D) 125 MCG (5000 UT) CAPS Take 1 capsule by mouth daily.  ? dorzolamide (TRUSOPT) 2 % ophthalmic solution 1 drop 2 (two) times daily.  ? famotidine (PEPCID) 40 MG tablet Take 1 tablet (40 mg total) by mouth at bedtime.  ? fluticasone (FLONASE) 50 MCG/ACT nasal spray USE 2 SPRAYS EACH NOSTRIL ONCE A DAY AS NEEDED FOR ALLERGIES OR CONGESTION.  ? gabapentin (NEURONTIN) 100 MG capsule Take 100 mg by mouth at bedtime.  ? latanoprost (XALATAN) 0.005 % ophthalmic solution Place 1 drop into both eyes at bedtime.  ? levothyroxine (SYNTHROID) 75 MCG tablet Take 1 tablet (75 mcg total) by mouth daily.  ? loratadine (CLARITIN) 10 MG tablet Take 1 tablet (10 mg total) by mouth daily.  ? losartan (COZAAR) 100 MG tablet Take 100 mg by mouth daily.  ? Lutein 20 MG CAPS Take 1 capsule (20 mg total) by mouth daily.  ? Magnesium 250 MG TABS Take 1 tablet by mouth daily.  ? Metoprolol Tartrate 75 MG TABS Take 1 tablet by mouth in the morning.  ? montelukast (SINGULAIR) 10 MG tablet Take 1 tablet (10 mg total) by mouth daily.  ? Multiple Vitamins-Minerals (PRESERVISION AREDS 2) CAPS Take  1 capsule by mouth in the morning and at bedtime.   ? potassium chloride (KLOR-CON) 10 MEQ tablet Take 20 mEq by mouth daily.  ? predniSONE (DELTASONE) 5 MG tablet Take 5 mg by mouth daily with breakfast.  ? Rivaroxaban (XARELTO) 15 MG TABS tablet TAKE 1 TABLET BY MOUTH  DAILY WITH SUPPER  ? sennosides-docusate sodium (SENOKOT-S) 8.6-50 MG tablet Take 2 tablets by mouth at bedtime.  ? SYMBICORT 160-4.5 MCG/ACT inhaler INHALE 2 PUFFS BY MOUTH  INTO THE LUNGS TWO TIMES  DAILY  ? [START ON 06/26/2021] Torsemide 40 MG TABS Take 40 mg by mouth daily.  ? [DISCONTINUED] furosemide (LASIX) 20 MG tablet Take 20 mg  by mouth 2 (two) times a week. Monday and Thursday, along with other dosing listed on medication list  ? [DISCONTINUED] furosemide (LASIX) 40 MG tablet TAKE 1 AND 1/2 TABLETS ('60MG'$ ) BY MOUTH ONCE DAILY  ? levalbuterol (XOPENEX) 0.63 MG/3ML nebulizer solution Take 3 mLs (0.63 mg total) by nebulization 2 (two) times daily as needed for wheezing or shortness of breath.  ? levalbuterol (XOPENEX) 0.63 MG/3ML nebulizer solution Take 3 mLs (0.63 mg total) by nebulization in the morning and at bedtime.  ? [DISCONTINUED] doxycycline (VIBRA-TABS) 100 MG tablet Take 1 tablet (100 mg total) by mouth 2 (two) times daily.  ? ?No facility-administered encounter medications on file as of 06/25/2021.  ? ? ?Review of Systems:  ?Review of Systems  ?Constitutional:  Positive for unexpected weight change. Negative for activity change, appetite change, chills, diaphoresis, fatigue and fever.  ?HENT:  Negative for congestion.   ?Respiratory:  Positive for cough and shortness of breath. Negative for wheezing.   ?Cardiovascular:  Positive for leg swelling. Negative for chest pain and palpitations.  ?Gastrointestinal:  Negative for abdominal distention, abdominal pain, constipation and diarrhea.  ?Genitourinary:  Negative for difficulty urinating and dysuria.  ?Musculoskeletal:  Negative for arthralgias, back pain, gait problem, joint  swelling and myalgias.  ?Neurological:  Negative for dizziness, tremors, seizures, syncope, facial asymmetry, speech difficulty, weakness, light-headedness, numbness and headaches.  ?Psychiatric/Behavioral:  Ne

## 2021-06-25 NOTE — Patient Instructions (Signed)
Take an additional Lasix 40  mg x 1 as soon as you get home ?Tomorrow 3/21 we will start torsemide  ?Keep watching your weight each day ?Low sodium diet ? ?

## 2021-06-28 ENCOUNTER — Telehealth: Payer: Self-pay | Admitting: Adult Health

## 2021-06-28 DIAGNOSIS — I5031 Acute diastolic (congestive) heart failure: Secondary | ICD-10-CM | POA: Diagnosis not present

## 2021-06-28 DIAGNOSIS — Z79899 Other long term (current) drug therapy: Secondary | ICD-10-CM | POA: Diagnosis not present

## 2021-06-28 LAB — BASIC METABOLIC PANEL
BUN: 25 — AB (ref 4–21)
BUN: 25 — AB (ref 4–21)
CO2: 30 — AB (ref 13–22)
CO2: 30 — AB (ref 13–22)
Chloride: 101 (ref 99–108)
Chloride: 101 (ref 99–108)
Creatinine: 1 (ref 0.5–1.1)
Creatinine: 1 (ref 0.5–1.1)
Glucose: 84
Glucose: 84
Potassium: 4 mEq/L (ref 3.5–5.1)
Potassium: 4 mEq/L (ref 3.5–5.1)
Sodium: 145 (ref 137–147)
Sodium: 145 (ref 137–147)

## 2021-06-28 LAB — COMPREHENSIVE METABOLIC PANEL
Calcium: 10.1 (ref 8.7–10.7)
Calcium: 10.1 (ref 8.7–10.7)

## 2021-06-28 MED ORDER — TORSEMIDE 40 MG PO TABS: 40.0000 mg | ORAL_TABLET | ORAL | 1 refills | Status: DC

## 2021-06-28 NOTE — Telephone Encounter (Signed)
Spoke with Clinic nurse Heather at [facility]. Jody Blake is doing better with less edema and weight loss. She started torsemide on Wednesday 3/23 and has lost 3.6 lbs in one day. Did lose weight with extra lasix dosing prior to this as well. She was 130.8 lbs on 3/20 and is now 125.8 lbs 06/28/2021 ? ?Will reduce torsemide to qod.  ?Baseline weight is 122-125 lbs.  ?

## 2021-06-29 LAB — MAGNESIUM: Magnesium: 2.6

## 2021-07-02 ENCOUNTER — Non-Acute Institutional Stay: Payer: Medicare Other | Admitting: Adult Health

## 2021-07-02 ENCOUNTER — Other Ambulatory Visit: Payer: Self-pay

## 2021-07-02 ENCOUNTER — Encounter: Payer: Self-pay | Admitting: Adult Health

## 2021-07-02 VITALS — BP 142/88 | HR 90 | Temp 97.3°F | Ht 63.0 in | Wt 128.6 lb

## 2021-07-02 DIAGNOSIS — I10 Essential (primary) hypertension: Secondary | ICD-10-CM | POA: Diagnosis not present

## 2021-07-02 DIAGNOSIS — I5031 Acute diastolic (congestive) heart failure: Secondary | ICD-10-CM | POA: Diagnosis not present

## 2021-07-02 LAB — MAGNESIUM: Magnesium: 2.6

## 2021-07-02 MED ORDER — TORSEMIDE 20 MG PO TABS: 20.0000 mg | ORAL_TABLET | Freq: Every day | ORAL | 3 refills | Status: DC | PRN

## 2021-07-02 NOTE — Progress Notes (Signed)
? ?Location:  [facility] ? ?POS: Clinic ? ?Provider: Royal Hawthorn, ANP ? ?Code Status: DNR, palliative ?Goals of Care:  ? ?  07/02/2021  ?  2:12 PM  ?Advanced Directives  ?Does Patient Have a Medical Advance Directive? Yes  ?Type of Paramedic of [place];Living will;Out of facility DNR (pink MOST or yellow form)  ?Does patient want to make changes to medical advance directive? No - Patient declined  ?Pre-existing out of facility DNR order (yellow form or pink MOST form) Yellow form placed in chart (order not valid for inpatient use);Pink MOST form placed in chart (order not valid for inpatient use)  ? ? ? ?Chief Complaint  ?Patient presents with  ? Medical Management of Chronic Issues  ?  Patient returns to the clinic for her 1 week follow up.   ? ? ?HPI: Patient is a 88 y.o. female seen today for follow up regarding CHF.  ?She was seen one week ago for weight gain and edema. She has a hx of diastolic CHF and has been on lasix and was needing frequent intervention. She was changed to torsemide 40 mg daily. The nurse called back that she had a rapid weight loss and so she was switched to 40 mg qod. She reports she is breathing so much better and that her edema is better. Her caregiver wonders if she could take a smaller dose each day because she seems to go up a pound on the days she misses. Also her cough is better and she is less hoarse.  ?BMP done 3/23 NA 145 K 4.0 BUN 24.7 Cr 0.95 Mg 2.6 ?Wt Readings from Last 3 Encounters:  ?07/02/21 128 lb 9.6 oz (58.3 kg)  ?06/25/21 133 lb (60.3 kg)  ?05/25/21 124 lb (56.2 kg)  ? ? ? ?Past Medical History:  ?Diagnosis Date  ? Anemia   ? Aortic stenosis   ? a. Echo 09/06/12 EF 55-60%, no WMA, G2DD, Ao valve sclerosis w/ mod stenosis, LA mildly dilated, PA pressure 30mHg  ? Asthmatic bronchitis   ? Complete heart block ([facility]   ? s/p permanent pacemaker 06/27/1999 (Battery change 06/2007 and 2016).  s/p AV nodal ablation by Dr ARayann Heman2016.  ? COPD (ch[place]  obstructive [place] disease) [place]   ? Dr. YAnnamaria Boots ? DDD (degenerative disc disease)   ? Diastolic dysfunction   ? a. Echo 09/06/12 EF 55-60%, no WMA, G2DD, Ao valve sclerosis w/ mod stenosis, LA mildly dilated, PA pressure 469mg  ? Diverticulosis   ? DVT (deep vein thrombosis) in pregnancy 1954  ? a. LLE  ? Hypertension   ? Hypothyroidism   ? on medication  ? Macular degeneration   ? Dr. KuEliezer Bottom? Osteoarthrosis, unspecified whether generalized or localized, other specified sites   ? PAF (paroxysmal atrial fibrillation) ([facility]  ? Stopped flecainide, on amiodarone but still has bouts of A FIB (mostly in mornings).   ? Pure hypercholesterolemia   ? Staghorn calculus   ? Left  ? ? ?Past Surgical History:  ?Procedure Laterality Date  ? APPENDECTOMY  ~ 1941  ? AV NODE ABLATION  05/10/2014  ? AV NODE ABLATION N/A 05/10/2014  ? Procedure: AV NODE ABLATION;  Surgeon: JaThompson GrayerMD;  [facility];  Service: Cardiovascular;  Laterality: N/A;  ? BUNIONECTOMY WITH HAMMERTOE RECONSTRUCTION Bilateral ~ 1990  ? CARDIAC PACEMAKER PLACEMENT  06/27/99  ? Medtronic PM implanted by Dr EdLeonia Reeves? CARDIOVERSION N/A 12/02/2013  ? Procedure: CARDIOVERSION;  Surgeon: [name], MD;  [facility] END[facility]COPY;  Service: Cardiovascular;  Laterality: N/A;  ? CATARACT EXTRACTION W/ INTRAOCULAR LENS  IMPLANT, BILATERAL Bilateral   ? COLONOSCOPY WITH PROPOFOL N/A 04/22/2013  ? Procedure: COLONOSCOPY WITH PROPOFOL;  Surgeon: [name], MD;  Location: [facility] ENDOSCOPY;  Service: Endoscopy;  Laterality: N/A;  ? DILATION AND CURETTAGE OF UTERUS  X 2  ? "when I was going thru menopause"  ? ESOPHAGOGASTRODUODENOSCOPY (EGD) WITH PROPOFOL N/A 04/22/2013  ? Procedure: ESOPHAGOGASTRODUODENOSCOPY (EGD) WITH PROPOFOL;  Surgeon: [name], MD;  Location: [facility] ENDOSCOPY;  Service: Endoscopy;  Laterality: N/A;  ? HERNIA REPAIR    ? INCISIONAL HERNIA REPAIR    ? INSERT / REPLACE / REMOVE PACEMAKER  06/2007  ? "took out the old; put in new"  ? INSERT /  REPLACE / REMOVE PACEMAKER  05/10/2014  ? MDT PPM generator change by Dr [name]  ? JOINT REPLACEMENT    ? PARTIAL NEPHRECTOMY Left 05/1974  ? stone disease  ? PERMANENT PACEMAKER GENERATOR CHANGE N/A 05/10/2014  ? Procedure: PERMANENT PACEMAKER GENERATOR CHANGE;  Surgeon: Thompson Grayer, MD;  Location: [facility] CATH LAB;  Service: Cardiovascular;  Laterality: N/A;  ? TONSILLECTOMY AND ADENOIDECTOMY  1930's  ? TOTAL KNEE ARTHROPLASTY Right 2001  ? ? ?Allergies  ?Allergen Reactions  ? Brimonidine Tartrate-Timolol Other (See Comments)  ?  REACTION: systemic malaise amigen eye drop  ? Penicillins Hives  ? Breo Ellipta [Fluticasone Furoate-Vilanterol] Other (See Comments)  ?  Ran blood pressure up ?Increased blood pressure  ? Vancomycin Other (See Comments)  ?  Red man syndrome - Mild redness and discomfort. Able to complete initial dose.   ? ? ?Outpatient Encounter Medications as of 07/02/2021  ?Medication Sig  ? albuterol (VENTOLIN HFA) 108 (90 Base) MCG/ACT inhaler Inhale 2 puffs into the lungs every 6 (six) hours as needed for wheezing or shortness of breath.  ? Azelastine HCl 137 MCG/SPRAY SOLN USE 1 TO 2 SPRAYS INTO BOTH NOSTRILS TWICE DAILY.  ? Cholecalciferol (VITAMIN D) 125 MCG (5000 UT) CAPS Take 1 capsule by mouth daily.  ? dorzolamide (TRUSOPT) 2 % ophthalmic solution 1 drop 2 (two) times daily.  ? famotidine (PEPCID) 40 MG tablet Take 1 tablet (40 mg total) by mouth at bedtime.  ? fluticasone (FLONASE) 50 MCG/ACT nasal spray USE 2 SPRAYS EACH NOSTRIL ONCE A DAY AS NEEDED FOR ALLERGIES OR CONGESTION.  ? gabapentin (NEURONTIN) 100 MG capsule Take 100 mg by mouth at bedtime.  ? latanoprost (XALATAN) 0.005 % ophthalmic solution Place 1 drop into both eyes at bedtime.  ? levalbuterol (XOPENEX) 0.63 MG/3ML nebulizer solution Take 3 mLs (0.63 mg total) by nebulization 2 (two) times daily as needed for wheezing or shortness of breath.  ? levalbuterol (XOPENEX) 0.63 MG/3ML nebulizer solution Take 3 mLs (0.63 mg total) by  nebulization in the morning and at bedtime.  ? levothyroxine (SYNTHROID) 75 MCG tablet Take 1 tablet (75 mcg total) by mouth daily.  ? loratadine (CLARITIN) 10 MG tablet Take 1 tablet (10 mg total) by mouth daily.  ? losartan (COZAAR) 100 MG tablet Take 100 mg by mouth daily.  ? Lutein 20 MG CAPS Take 1 capsule (20 mg total) by mouth daily.  ? Magnesium 250 MG TABS Take 1 tablet by mouth daily.  ? Metoprolol Tartrate 75 MG TABS Take 1 tablet by mouth in the morning.  ? montelukast (SINGULAIR) 10 MG tablet Take 1 tablet (10 mg total) by mouth daily.  ? Multiple  Vitamins-Minerals (PRESERVISION AREDS 2) CAPS Take 1 capsule by mouth in the morning and at bedtime.   ? potassium chloride (KLOR-CON) 10 MEQ tablet Take 20 mEq by mouth daily.  ? predniSONE (DELTASONE) 5 MG tablet Take 5 mg by mouth daily with breakfast.  ? Rivaroxaban (XARELTO) 15 MG TABS tablet TAKE 1 TABLET BY MOUTH  DAILY WITH SUPPER  ? sennosides-docusate sodium (SENOKOT-S) 8.6-50 MG tablet Take 2 tablets by mouth at bedtime.  ? SYMBICORT 160-4.5 MCG/ACT inhaler INHALE 2 PUFFS BY MOUTH  INTO THE LUNGS TWO TIMES  DAILY  ? Torsemide 40 MG TABS Take 40 mg by mouth every other day.  ? ?No facility-administered encounter medications on file as of 07/02/2021.  ? ? ?Review of Systems:  ?Review of Systems  ?Constitutional:  Negative for activity change, appetite change, chills, diaphoresis, fatigue, fever and unexpected weight change.  ?HENT:  Positive for hearing loss and rhinorrhea (chronic). Negative for sinus pressure, sinus pain, sore throat and trouble swallowing.   ?Eyes:   ?     Vision loss  ?Respiratory:  Positive for cough (chronic but improved.) and shortness of breath (with exertion, has improved.). Negative for wheezing.   ?Cardiovascular:  Positive for leg swelling (improved). Negative for chest pain and palpitations.  ?Gastrointestinal:  Negative for abdominal distention, constipation and diarrhea.  ?Musculoskeletal:  Negative for gait problem.   ?Psychiatric/Behavioral:  Negative for confusion.   ? ?Health Maintenance  ?Topic Date Due  ? COVID-19 Vaccine (4 - Booster for Moderna series) 10/13/2020  ? TETANUS/TDAP  02/23/2029  ? Pneumonia Vaccine 65+ Y

## 2021-07-06 NOTE — Progress Notes (Signed)
? ?Subjective:  ? ? Patient ID: Jody Blake, female    DOB: [birth year], 88 y.o.   MRN: [id number] ? ?HPI ?F never smoker, followed for allergic rhinitis, chronic bronchitis, complicated by GERD, Hx PAfib/ pacemaker. ?------------------------------------------------------------------------------------------------ ?  ? ?11/22/20- 88 year old female never smoker followed for Allergic rhinitis, COPD mixed type, complicated by GERD,  AFib/pacemaker/ Xarelto, Glaucoma/ macular degen, dCHF, CHB/ pacemaker, AoStenosis, CKDIII, TIA      Living at [facility] ?-Singulair, Ventolin hfa, Symbicort 160, flonase, astelin, prednisone 5 mg daily, Pepcid,Gabapentin 100 qhs,  ?Covid vax- 4 Moderna ?ED for cough 5/25> doxycycline,  ?-----COPD, productive cough, clear sputum ?About once/ month she wakes coughing. Doesn't recognize reflux events. Similar episode in May treated in ED with doxy. Current episode typical.  ?CXR 08/30/20-  ?Chronic changes without acute abnormality. ? ?07/09/21- 88 year old female never smoker followed for Allergic rhinitis, COPD mixed type, complicated by GERD,  AFib/pacemaker/ Xarelto, Glaucoma/ macular degen, dCHF, CHB/ pacemaker, AoStenosis, CKDIII, TIA      Living at [facility] ?-Singulair, Ventolin hfa, Symbicort 160, Neb Xopenex,  flonase, astelin, prednisone 5 mg daily, Pepcid,Gabapentin 100 qhs, Neb Xopenex,  ?Covid vax- 4 Moderna                    Caregiver here with her today ?Flu vax-had ?NP [place] 2/17 for bronchitis after hosp pneumonia/ CHF in Jan. Managed at [facility] for dCHF.> adjusting diuretic. ?Still postnasal drip. Occasional reflux. Sleeps with HOB up.  ?CXR 05/25/21- ?IMPRESSION: ?1. Diffuse, chronic appearing increased interstitial lung markings. ?2. Small left pleural effusion. ? ? ?ROS-see HPI   + = positive ?Constitutional:    weight loss, night sweats, fevers, chills, fatigue, lassitude. ?HEENT:    headaches, difficulty swallowing, tooth/dental problems, sore throat,  ?      sneezing, itching, ear ache, nasal congestion, post nasal drip, snoring ?CV:    chest pain, orthopnea, PND, swelling in lower extremities, anasarca,                                                    dizziness, palpitations ?Resp:   shortness of breath with exertion or at rest.   ?              productive cough,   +non-productive cough, coughing up of blood.   ?           change in color of mucus.  +wheezing.   ?Skin:    rash or lesions. ?GI: +  heartburn, indigestion, abdominal pain, nausea, vomiting,  ?GU:  ?MS:   joint pain, stiffness,  ?Neuro-     nothing unusual ?Psych:  change in mood or affect.  depression or anxiety.   memory loss. ? ?Objective:  ?OBJ- Physical Exam ?General- Alert, Oriented, Affect-appropriate, Distress- none acute, + slender ?Skin- rash-none, lesions- none, excoriation- none ?Lymphadenopathy- none ?Head- atraumatic ?           Eyes- Gross vision intact, PERRLA, conjunctivae and secretions clear ?           Ears- + Hard of hearing ?           Nose- Clear, no-Septal dev, mucus, polyps, erosion, perforation  ?           Throat- Mallampati II , mucosa clear , drainage- none, tonsils- atrophic ?Neck- flexible , trachea midline, no  stridor , thyroid nl, carotid no bruit ?Chest - symmetrical excursion , unlabored ?          Heart/CV- RRR , no murmur , no gallop  , no rub, nl s1 s2 ?                          - JVD- none , edema+ elastic hose, stasis changes- none, varices- none ?          Lung-  Wheeze -nonel, cough+ , dullness -none, rub- none ?          Chest wall-+ left pacemaker.  ?Abd-   ?Br/ Gen/ Rectal- Not done, not indicated ?Extrem- cyanosis- none, clubbing, none, atrophy- none, strength- nl ?Neuro- grossly intact to observation ?   ?Assessment & Plan:  ? ?

## 2021-07-09 ENCOUNTER — Encounter: Payer: Self-pay | Admitting: Internal Medicine

## 2021-07-09 ENCOUNTER — Ambulatory Visit (INDEPENDENT_AMBULATORY_CARE_PROVIDER_SITE_OTHER): Payer: Medicare Other | Admitting: Internal Medicine

## 2021-07-09 DIAGNOSIS — J449 Chronic obstructive pulmonary disease, unspecified: Secondary | ICD-10-CM | POA: Diagnosis not present

## 2021-07-09 DIAGNOSIS — K219 Gastro-esophageal reflux disease without esophagitis: Secondary | ICD-10-CM | POA: Diagnosis not present

## 2021-07-09 NOTE — Patient Instructions (Signed)
Continue Symbicort 2 puffs, then rinse mouth, twice daily ? ?Change thee Xopenex(levalbuterol) nebulizer to twice daily, ONLY IF NEEDED ? ?Ok to continue the Claritin, azelastine nasal spray and flonase nasal spray ? ?Please call if we can help ?

## 2021-07-18 ENCOUNTER — Other Ambulatory Visit: Payer: Self-pay | Admitting: *Deleted

## 2021-07-18 MED ORDER — MONTELUKAST SODIUM 10 MG PO TABS: 10.0000 mg | ORAL_TABLET | Freq: Every day | ORAL | 3 refills | Status: DC

## 2021-07-18 MED ORDER — PREDNISONE 5 MG PO TABS: 5.0000 mg | ORAL_TABLET | Freq: Every day | ORAL | 11 refills | Status: DC

## 2021-07-18 MED ORDER — GABAPENTIN 100 MG PO CAPS: 100.0000 mg | ORAL_CAPSULE | Freq: Every day | ORAL | 11 refills | Status: DC

## 2021-07-18 NOTE — Telephone Encounter (Signed)
[facility] requested refill.  ?Pended Rx and sent to Dr. Lyndel Safe for approval.  ?

## 2021-07-18 NOTE — Addendum Note (Signed)
Addended by: Rafael Bihari A on: 07/18/2021 09:50 AM ? ? Modules accepted: Orders ? ?

## 2021-07-18 NOTE — Telephone Encounter (Signed)
[facility] requested refill.  ?

## 2021-07-20 DIAGNOSIS — Z20822 Contact with and (suspected) exposure to covid-19: Secondary | ICD-10-CM | POA: Diagnosis not present

## 2021-07-23 ENCOUNTER — Other Ambulatory Visit: Payer: Self-pay

## 2021-07-23 MED ORDER — LUTEIN 20 MG PO CAPS: 20.0000 mg | ORAL_CAPSULE | Freq: Every day | ORAL | 11 refills | Status: DC

## 2021-07-23 MED ORDER — METOPROLOL TARTRATE 50 MG PO TABS: 75.0000 mg | ORAL_TABLET | Freq: Every morning | ORAL | 0 refills | Status: DC

## 2021-07-24 MED ORDER — MONTELUKAST SODIUM 10 MG PO TABS: 10.0000 mg | ORAL_TABLET | Freq: Every day | ORAL | 11 refills | Status: DC

## 2021-07-24 MED ORDER — LOSARTAN POTASSIUM 100 MG PO TABS: 100.0000 mg | ORAL_TABLET | Freq: Every day | ORAL | 11 refills | Status: DC

## 2021-07-24 MED ORDER — METOPROLOL TARTRATE 50 MG PO TABS: 75.0000 mg | ORAL_TABLET | Freq: Every morning | ORAL | 11 refills | Status: DC

## 2021-07-24 MED ORDER — RIVAROXABAN 15 MG PO TABS: ORAL_TABLET | ORAL | 11 refills | Status: DC

## 2021-07-24 MED ORDER — PRESERVISION AREDS 2 PO CAPS: 1.0000 | ORAL_CAPSULE | Freq: Two times a day (BID) | ORAL | 11 refills | Status: DC

## 2021-07-30 ENCOUNTER — Other Ambulatory Visit: Payer: Self-pay | Admitting: Internal Medicine

## 2021-07-30 MED ORDER — BUDESONIDE-FORMOTEROL FUMARATE 160-4.5 MCG/ACT IN AERO: 2.0000 | INHALATION_SPRAY | Freq: Every day | RESPIRATORY_TRACT | 3 refills | Status: DC

## 2021-08-02 ENCOUNTER — Telehealth: Payer: Self-pay | Admitting: Adult Health

## 2021-08-02 MED ORDER — TORSEMIDE 20 MG PO TABS: ORAL_TABLET | ORAL | 3 refills | Status: DC

## 2021-08-02 NOTE — Telephone Encounter (Signed)
Spoke with clinic nurse Nira Conn. Pt has gained weight and is concerned about torsemide dosing. She takes the prn dose and loses 2lbs but then the weight come back on. Orders given to take torsemide 40 mg M W F and 20 mg other days. ?

## 2021-08-05 ENCOUNTER — Encounter: Payer: Self-pay | Admitting: Internal Medicine

## 2021-08-05 NOTE — Assessment & Plan Note (Signed)
She is at some risk for aspiration pneumonia.  ?Plan- emphasized reflux precautions. ?

## 2021-08-05 NOTE — Assessment & Plan Note (Addendum)
Resolved bronchitis and exacerb CHF during the winter. Near baseline now, but elderly and frail. ?Plan- change  neb to prn and continue Symbicort ?

## 2021-08-06 ENCOUNTER — Other Ambulatory Visit: Payer: Self-pay | Admitting: *Deleted

## 2021-08-06 ENCOUNTER — Other Ambulatory Visit: Payer: Self-pay

## 2021-08-06 ENCOUNTER — Other Ambulatory Visit: Payer: Self-pay | Admitting: Adult Health

## 2021-08-06 MED ORDER — LEVOTHYROXINE SODIUM 75 MCG PO TABS: 75.0000 ug | ORAL_TABLET | Freq: Every day | ORAL | 5 refills | Status: DC

## 2021-08-06 MED ORDER — VITAMIN D 125 MCG (5000 UT) PO CAPS: 1.0000 | ORAL_CAPSULE | Freq: Every day | ORAL | 3 refills | Status: DC

## 2021-08-06 MED ORDER — POTASSIUM CHLORIDE ER 10 MEQ PO TBCR: 20.0000 meq | EXTENDED_RELEASE_TABLET | Freq: Every day | ORAL | 5 refills | Status: DC

## 2021-08-06 MED ORDER — FAMOTIDINE 40 MG PO TABS: 40.0000 mg | ORAL_TABLET | Freq: Every day | ORAL | 3 refills | Status: DC

## 2021-08-06 NOTE — Addendum Note (Signed)
Addended by: Logan Bores on: 08/06/2021 12:46 PM ? ? Modules accepted: Orders ? ?

## 2021-08-06 NOTE — Telephone Encounter (Signed)
Pharmacy requested refill.  ?Pended Rx and sent to [facility] for approval.  ?

## 2021-08-08 ENCOUNTER — Non-Acute Institutional Stay: Payer: Medicare Other | Admitting: Internal Medicine

## 2021-08-08 VITALS — BP 124/86 | HR 71 | Temp 96.8°F | Ht 63.0 in | Wt 130.6 lb

## 2021-08-08 DIAGNOSIS — K219 Gastro-esophageal reflux disease without esophagitis: Secondary | ICD-10-CM

## 2021-08-08 DIAGNOSIS — N1831 Chronic kidney disease, stage 3a: Secondary | ICD-10-CM

## 2021-08-08 DIAGNOSIS — I442 Atrioventricular block, complete: Secondary | ICD-10-CM

## 2021-08-08 DIAGNOSIS — I5032 Chronic diastolic (congestive) heart failure: Secondary | ICD-10-CM | POA: Diagnosis not present

## 2021-08-08 DIAGNOSIS — I4821 Permanent atrial fibrillation: Secondary | ICD-10-CM | POA: Diagnosis not present

## 2021-08-08 DIAGNOSIS — I10 Essential (primary) hypertension: Secondary | ICD-10-CM | POA: Diagnosis not present

## 2021-08-08 DIAGNOSIS — E039 Hypothyroidism, unspecified: Secondary | ICD-10-CM | POA: Diagnosis not present

## 2021-08-08 DIAGNOSIS — J441 Chronic obstructive pulmonary disease with (acute) exacerbation: Secondary | ICD-10-CM

## 2021-08-08 MED ORDER — CALCIUM 600-10 MG-MCG PO CHEW: 1.0000 | CHEWABLE_TABLET | Freq: Every day | ORAL | 3 refills | Status: DC

## 2021-08-08 MED ORDER — TORSEMIDE 20 MG PO TABS: 20.0000 mg | ORAL_TABLET | Freq: Every day | ORAL | 3 refills | Status: DC

## 2021-08-08 MED ORDER — TORSEMIDE 10 MG PO TABS: 10.0000 mg | ORAL_TABLET | Freq: Every day | ORAL | 3 refills | Status: DC

## 2021-08-08 NOTE — Progress Notes (Signed)
? ?Location:    ?  ?Place of Service:    ? ?Provider:  ? ?Code Status: DNR ?Goals of Care:  ? ?  07/02/2021  ?  2:12 PM  ?Advanced Directives  ?Does Patient Have a Medical Advance Directive? Yes  ?Type of Paramedic of [place];Living will;Out of facility DNR (pink MOST or yellow form)  ?Does patient want to make changes to medical advance directive? No - Patient declined  ?Pre-existing out of facility DNR order (yellow form or pink MOST form) Yellow form placed in chart (order not valid for inpatient use);Pink MOST form placed in chart (order not valid for inpatient use)  ? ? ? ?Chief Complaint  ?Patient presents with  ? Medical Management of Chronic Issues  ?  Patient returns to the clinic for her 3 month follow up.   ? ? ?HPI: Patient is a 88 y.o. female seen today for medical management of chronic diseases.   ? ?Patient has h/o A. fib on Xarelto s/p AV nodal ablation, s/p PPM in 2009 ?Aortic stenosis ? COPD with frequent exacerbations.  Not on oxygen.  On chronic prednisone.  Follows closely with Dr. [name].  Is enrolled in palliative care ?Chronic diastolic CHF  ?History of chronic venous insufficiency ?Vision loss due to macular degeneration and glaucoma ?Very HOH ?Osteopenia ? ?She is doing well today ?[facility] Nurses take care of her Meds  ?Did have noticed that her blood pressure runs lower when she the day when she takes increased dose of torsemide. ?But otherwise her weight is doing better.  Patient denies any more cough or shortness of breath. ? ?Wt Readings from Last 3 Encounters:  ?08/08/21 130 lb 9.6 oz (59.2 kg)  ?07/09/21 129 lb 3.2 oz (58.6 kg)  ?07/02/21 128 lb 9.6 oz (58.3 kg)  ?She also wanted to discuss her DEXA scan today.  And also wanted to know if he can change her torsemide to 30 mg as she feels very weak the days when she takes 40 ? ?She has Caregivers to help her in the morning with ADLS ?Past Medical History:  ?Diagnosis Date  ? Anemia   ? Aortic stenosis   ? a.  Echo 09/06/12 EF 55-60%, no WMA, G2DD, Ao valve sclerosis w/ mod stenosis, LA mildly dilated, PA pressure 23mHg  ? Asthmatic bronchitis   ? Complete heart block ([facility]   ? s/p permanent pacemaker 06/27/1999 (Battery change 06/2007 and 2016).  s/p AV nodal ablation by Dr ARayann Heman2016.  ? [place] disease) [place]   ? Dr. YAnnamaria Boots ? DDD (degenerative disc disease)   ? Diastolic dysfunction   ? a. Echo 09/06/12 EF 55-60%, no WMA, G2DD, Ao valve sclerosis w/ mod stenosis, LA mildly dilated, PA pressure 439mg  ? Diverticulosis   ? DVT (deep vein thrombosis) in pregnancy 1954  ? a. LLE  ? Hypertension   ? Hypothyroidism   ? on medication  ? Macular degeneration   ? Dr. KuEliezer Bottom? Osteoarthrosis, unspecified whether generalized or localized, other specified sites   ? PAF (paroxysmal atrial fibrillation) ([facility]  ? Stopped flecainide, on amiodarone but still has bouts of A FIB (mostly in mornings).   ? Pure hypercholesterolemia   ? Staghorn calculus   ? Left  ? ? ?Past Surgical History:  ?Procedure Laterality Date  ? APPENDECTOMY  ~ 1941  ? AV NODE ABLATION  05/10/2014  ? AV NODE ABLATION N/A 05/10/2014  ? Procedure: AV NODE ABLATION;  Surgeon: Thompson Grayer, MD;  Location: [facility] CATH LAB;  Service: Cardiovascular;  Laterality: N/A;  ? BUNIONECTOMY WITH HAMMERTOE RECONSTRUCTION Bilateral ~ 1990  ? CARDIAC PACEMAKER PLACEMENT  06/27/99  ? Medtronic PM implanted by Dr [name]  ? CARDIOVERSION N/A 12/02/2013  ? Procedure: CARDIOVERSION;  Surgeon: [name], MD;  Location: [facility] ENDOSCOPY;  Service: Cardiovascular;  Laterality: N/A;  ? CATARACT EXTRACTION W/ INTRAOCULAR LENS  IMPLANT, BILATERAL Bilateral   ? COLONOSCOPY WITH PROPOFOL N/A 04/22/2013  ? Procedure: COLONOSCOPY WITH PROPOFOL;  Surgeon: [name], MD;  Location: [facility] ENDOSCOPY;  Service: Endoscopy;  Laterality: N/A;  ? DILATION AND CURETTAGE OF UTERUS  X 2  ? "when I was going thru menopause"  ? ESOPHAGOGASTRODUODENOSCOPY (EGD) WITH PROPOFOL N/A 04/22/2013  ?  Procedure: ESOPHAGOGASTRODUODENOSCOPY (EGD) WITH PROPOFOL;  Surgeon: [name], MD;  Location: [facility] ENDOSCOPY;  Service: Endoscopy;  Laterality: N/A;  ? HERNIA REPAIR    ? INCISIONAL HERNIA REPAIR    ? INSERT / REPLACE / REMOVE PACEMAKER  06/2007  ? "took out the old; put in new"  ? INSERT / REPLACE / REMOVE PACEMAKER  05/10/2014  ? MDT PPM generator change by Dr [name]  ? JOINT REPLACEMENT    ? PARTIAL NEPHRECTOMY Left 05/1974  ? stone disease  ? PERMANENT PACEMAKER GENERATOR CHANGE N/A 05/10/2014  ? Procedure: PERMANENT PACEMAKER GENERATOR CHANGE;  Surgeon: Thompson Grayer, MD;  Location: [facility] CATH LAB;  Service: Cardiovascular;  Laterality: N/A;  ? TONSILLECTOMY AND ADENOIDECTOMY  1930's  ? TOTAL KNEE ARTHROPLASTY Right 2001  ? ? ?Allergies  ?Allergen Reactions  ? Brimonidine Tartrate-Timolol Other (See Comments)  ?  REACTION: systemic malaise amigen eye drop  ? Penicillins Hives  ? Breo Ellipta [Fluticasone Furoate-Vilanterol] Other (See Comments)  ?  Ran blood pressure up ?Increased blood pressure  ? Vancomycin Other (See Comments)  ?  Red man syndrome - Mild redness and discomfort. Able to complete initial dose.   ? ? ?Outpatient Encounter Medications as of 08/08/2021  ?Medication Sig  ? Calcium 600-10 MG-MCG CHEW Chew 1 capsule by mouth daily.  ? torsemide (DEMADEX) 10 MG tablet Take 1 tablet (10 mg total) by mouth daily.  ? torsemide (DEMADEX) 20 MG tablet Take 1 tablet (20 mg total) by mouth daily.  ? albuterol (VENTOLIN HFA) 108 (90 Base) MCG/ACT inhaler Inhale 2 puffs into the lungs every 6 (six) hours as needed for wheezing or shortness of breath.  ? Azelastine HCl 137 MCG/SPRAY SOLN USE 1 TO 2 SPRAYS INTO BOTH NOSTRILS TWICE DAILY.  ? budesonide-formoterol (SYMBICORT) 160-4.5 MCG/ACT inhaler Inhale 2 puffs into the lungs daily.  ? Cholecalciferol (VITAMIN D) 125 MCG (5000 UT) CAPS Take 1 capsule by mouth daily.  ? dorzolamide (TRUSOPT) 2 % ophthalmic solution 1 drop 2 (two) times daily.  ? famotidine (PEPCID)  40 MG tablet Take 1 tablet (40 mg total) by mouth at bedtime.  ? fluticasone (FLONASE) 50 MCG/ACT nasal spray USE 2 SPRAYS EACH NOSTRIL ONCE A DAY AS NEEDED FOR ALLERGIES OR CONGESTION.  ? gabapentin (NEURONTIN) 100 MG capsule Take 1 capsule (100 mg total) by mouth at bedtime.  ? latanoprost (XALATAN) 0.005 % ophthalmic solution Place 1 drop into both eyes at bedtime.  ? levalbuterol (XOPENEX) 0.63 MG/3ML nebulizer solution Take 3 mLs (0.63 mg total) by nebulization 2 (two) times daily as needed for wheezing or shortness of breath.  ? levalbuterol (XOPENEX) 0.63 MG/3ML nebulizer solution Take 3 mLs (0.63 mg total) by nebulization in the  morning and at bedtime.  ? levothyroxine (SYNTHROID) 75 MCG tablet Take 1 tablet (75 mcg total) by mouth daily.  ? loratadine (CLARITIN) 10 MG tablet Take 1 tablet (10 mg total) by mouth daily.  ? losartan (COZAAR) 100 MG tablet Take 1 tablet (100 mg total) by mouth daily.  ? Lutein 20 MG CAPS Take 1 capsule (20 mg total) by mouth daily.  ? Magnesium 250 MG TABS Take 1 tablet by mouth daily.  ? metoprolol tartrate (LOPRESSOR) 50 MG tablet Take 1.5 tablets (75 mg total) by mouth in the morning.  ? montelukast (SINGULAIR) 10 MG tablet Take 1 tablet (10 mg total) by mouth daily.  ? Multiple Vitamins-Minerals (PRESERVISION AREDS 2) CAPS Take 1 capsule by mouth in the morning and at bedtime.  ? potassium chloride (KLOR-CON) 10 MEQ tablet Take 2 tablets (20 mEq total) by mouth daily.  ? predniSONE (DELTASONE) 5 MG tablet Take 1 tablet (5 mg total) by mouth daily with breakfast.  ? Rivaroxaban (XARELTO) 15 MG TABS tablet TAKE 1 TABLET BY MOUTH  DAILY WITH SUPPER  ? sennosides-docusate sodium (SENOKOT-S) 8.6-50 MG tablet Take 2 tablets by mouth at bedtime.  ? torsemide (DEMADEX) 20 MG tablet Take 1 tablet (20 mg total) by mouth daily as needed. 3 lb weight gain in 1 day or 5 lb in 1 week or excessive edema  ? [DISCONTINUED] torsemide (DEMADEX) 20 MG tablet Take 40 mg Mon Wed Fri and 20 mg  Tues Thurs Sat and Sun.  ? ?No facility-administered encounter medications on file as of 08/08/2021.  ? ? ?Review of Systems:  ?Review of Systems  ?Constitutional:  Negative for activity change and appet

## 2021-08-09 ENCOUNTER — Ambulatory Visit: Payer: Medicare Other | Attending: Internal Medicine

## 2021-08-09 ENCOUNTER — Other Ambulatory Visit (HOSPITAL_BASED_OUTPATIENT_CLINIC_OR_DEPARTMENT_OTHER): Payer: Self-pay

## 2021-08-09 DIAGNOSIS — Z23 Encounter for immunization: Secondary | ICD-10-CM

## 2021-08-09 DIAGNOSIS — Z20822 Contact with and (suspected) exposure to covid-19: Secondary | ICD-10-CM | POA: Diagnosis not present

## 2021-08-09 MED ORDER — MODERNA COVID-19 BIVAL BOOSTER 50 MCG/0.5ML IM SUSP
INTRAMUSCULAR | 0 refills | Status: DC
  Filled 2021-08-09: qty 0.3, 1d supply, fill #0

## 2021-08-09 NOTE — Progress Notes (Signed)
? ?  Covid-19 Vaccination Clinic ? ?Name:  Jody Blake    ?MRN: [id number] ?DOB: 06-08-1925 ? ?08/09/2021 ? ?[name] was observed post Covid-19 immunization for 15 minutes without incident. She was provided with Vaccine Information Sheet and instruction to access the V-Safe system.  ? ?[name] was instructed to call 911 with any severe reactions post vaccine: ?Difficulty breathing  ?Swelling of face and throat  ?A fast heartbeat  ?A bad rash all over body  ?Dizziness and weakness  ? ?Immunizations Administered   ? ? Name Date Dose VIS Date Route  ? Moderna Covid-19 vaccine Bivalent Booster 08/09/2021  1:35 PM 0.5 mL 11/18/2020 Intramuscular  ? Manufacturer: Moderna  ? Lot: [id number]  ? [place]: [id number]  ? ?  ? ? ?

## 2021-08-13 ENCOUNTER — Ambulatory Visit (INDEPENDENT_AMBULATORY_CARE_PROVIDER_SITE_OTHER): Payer: Medicare Other | Admitting: Internal Medicine

## 2021-08-13 ENCOUNTER — Encounter: Payer: Self-pay | Admitting: Internal Medicine

## 2021-08-13 VITALS — BP 132/80 | HR 81 | Ht 63.0 in | Wt 129.0 lb

## 2021-08-13 DIAGNOSIS — I10 Essential (primary) hypertension: Secondary | ICD-10-CM | POA: Diagnosis not present

## 2021-08-13 DIAGNOSIS — I4821 Permanent atrial fibrillation: Secondary | ICD-10-CM

## 2021-08-13 DIAGNOSIS — I442 Atrioventricular block, complete: Secondary | ICD-10-CM

## 2021-08-13 DIAGNOSIS — I5032 Chronic diastolic (congestive) heart failure: Secondary | ICD-10-CM | POA: Diagnosis not present

## 2021-08-13 DIAGNOSIS — Z950 Presence of cardiac pacemaker: Secondary | ICD-10-CM

## 2021-08-13 NOTE — Progress Notes (Signed)
? ? ? ? ?HPI ?Jody Blake is referred today for ongoing evaluation of CHB and atrial fib. The patient has a h/o HTN and diastolic heart failure which is improved after AV node ablation. The patient denies bleeding, peripheral edema. Her dyspnea is class 2.  ?Allergies  ?Allergen Reactions  ? Brimonidine Tartrate-Timolol Other (See Comments)  ?  REACTION: systemic malaise amigen eye drop  ? Penicillins Hives  ? Breo Ellipta [Fluticasone Furoate-Vilanterol] Other (See Comments)  ?  Ran blood pressure up ?Increased blood pressure  ? Vancomycin Other (See Comments)  ?  Red man syndrome - Mild redness and discomfort. Able to complete initial dose.   ? ? ? ?Current Outpatient Medications  ?Medication Sig Dispense Refill  ? albuterol (VENTOLIN HFA) 108 (90 Base) MCG/ACT inhaler Inhale 2 puffs into the lungs every 6 (six) hours as needed for wheezing or shortness of breath.    ? Azelastine HCl 137 MCG/SPRAY SOLN USE 1 TO 2 SPRAYS INTO BOTH NOSTRILS TWICE DAILY. 30 mL PRN  ? budesonide-formoterol (SYMBICORT) 160-4.5 MCG/ACT inhaler Inhale 2 puffs into the lungs daily. 30.6 g 3  ? Calcium 600-10 MG-MCG CHEW Chew 1 capsule by mouth daily. 30 tablet 3  ? Cholecalciferol (VITAMIN D) 125 MCG (5000 UT) CAPS Take 1 capsule by mouth daily. 90 capsule 3  ? COVID-19 mRNA bivalent vaccine, Moderna, (MODERNA COVID-19 BIVAL BOOSTER) 50 MCG/0.5ML injection Inject into the muscle. 0.3 mL 0  ? dorzolamide (TRUSOPT) 2 % ophthalmic solution 1 drop 2 (two) times daily.    ? famotidine (PEPCID) 40 MG tablet Take 1 tablet (40 mg total) by mouth at bedtime. 90 tablet 3  ? fluticasone (FLONASE) 50 MCG/ACT nasal spray USE 2 SPRAYS EACH NOSTRIL ONCE A DAY AS NEEDED FOR ALLERGIES OR CONGESTION. 16 g PRN  ? gabapentin (NEURONTIN) 100 MG capsule Take 1 capsule (100 mg total) by mouth at bedtime. 30 capsule 11  ? latanoprost (XALATAN) 0.005 % ophthalmic solution Place 1 drop into both eyes at bedtime.    ? levalbuterol (XOPENEX) 0.63 MG/3ML nebulizer  solution Take 3 mLs (0.63 mg total) by nebulization 2 (two) times daily as needed for wheezing or shortness of breath. 3 mL 3  ? levalbuterol (XOPENEX) 0.63 MG/3ML nebulizer solution Take 3 mLs (0.63 mg total) by nebulization in the morning and at bedtime. 180 mL 11  ? levothyroxine (SYNTHROID) 75 MCG tablet Take 1 tablet (75 mcg total) by mouth daily. 30 tablet 5  ? loratadine (CLARITIN) 10 MG tablet Take 1 tablet (10 mg total) by mouth daily. 30 tablet 11  ? losartan (COZAAR) 100 MG tablet Take 1 tablet (100 mg total) by mouth daily. 28 tablet 11  ? Lutein 20 MG CAPS Take 1 capsule (20 mg total) by mouth daily. 30 capsule 11  ? Magnesium 250 MG TABS Take 1 tablet by mouth daily.    ? metoprolol tartrate (LOPRESSOR) 50 MG tablet Take 1.5 tablets (75 mg total) by mouth in the morning. 45 tablet 11  ? montelukast (SINGULAIR) 10 MG tablet Take 1 tablet (10 mg total) by mouth daily. 30 tablet 11  ? Multiple Vitamins-Minerals (PRESERVISION AREDS 2) CAPS Take 1 capsule by mouth in the morning and at bedtime. 56 capsule 11  ? potassium chloride (KLOR-CON) 10 MEQ tablet Take 2 tablets (20 mEq total) by mouth daily. 60 tablet 5  ? predniSONE (DELTASONE) 5 MG tablet Take 1 tablet (5 mg total) by mouth daily with breakfast. 30 tablet 11  ? Rivaroxaban (XARELTO)  15 MG TABS tablet TAKE 1 TABLET BY MOUTH  DAILY WITH SUPPER 28 tablet 11  ? sennosides-docusate sodium (SENOKOT-S) 8.6-50 MG tablet Take 2 tablets by mouth at bedtime.    ? torsemide (DEMADEX) 10 MG tablet Take 1 tablet (10 mg total) by mouth daily. 30 tablet 3  ? torsemide (DEMADEX) 20 MG tablet Take 1 tablet (20 mg total) by mouth daily as needed. 3 lb weight gain in 1 day or 5 lb in 1 week or excessive edema 30 tablet 3  ? torsemide (DEMADEX) 20 MG tablet Take 1 tablet (20 mg total) by mouth daily. 30 tablet 3  ? ?No current facility-administered medications for this visit.  ? ? ? ?Past Medical History:  ?Diagnosis Date  ? Anemia   ? Aortic stenosis   ? a. Echo  09/06/12 EF 55-60%, no WMA, G2DD, Ao valve sclerosis w/ mod stenosis, LA mildly dilated, PA pressure 49mHg  ? Asthmatic bronchitis   ? Complete heart block ([facility]   ? s/p permanent pacemaker 06/27/1999 (Battery change 06/2007 and 2016).  s/p AV nodal ablation by Dr ARayann Heman2016.  ? [place] disease[place]   ? Dr. YAnnamaria Boots ? DDD (degenerative disc disease)   ? Diastolic dysfunction   ? a. Echo 09/06/12 EF 55-60%, no WMA, G2DD, Ao valve sclerosis w/ mod stenosis, LA mildly dilated, PA pressure 432mg  ? Diverticulosis   ? DVT (deep vein thrombosis) in pregnancy 1954  ? a. LLE  ? Hypertension   ? Hypothyroidism   ? on medication  ? Macular degeneration   ? Dr. KuEliezer Bottom? Osteoarthrosis, unspecified whether generalized or localized, other specified sites   ? PAF (paroxysmal atrial fibrillation) ([facility]  ? Stopped flecainide, on amiodarone but still has bouts of A FIB (mostly in mornings).   ? Pure hypercholesterolemia   ? Staghorn calculus   ? Left  ? ? ?ROS: ? ? All systems reviewed and negative except as noted in the HPI. ? ? ?Past Surgical History:  ?Procedure Laterality Date  ? APPENDECTOMY  ~ 1941  ? AV NODE ABLATION  05/10/2014  ? AV NODE ABLATION N/A 05/10/2014  ? Procedure: AV NODE ABLATION;  Surgeon: JaThompson GrayerMD;  [facility];  Service: Cardiovascular;  Laterality: N/A;  ? BUNIONECTOMY WITH HAMMERTOE RECONSTRUCTION Bilateral ~ 1990  ? CARDIAC PACEMAKER PLACEMENT  06/27/99  ? Medtronic PM implanted by Dr EdLeonia Reeves? CARDIOVERSION N/A 12/02/2013  ? Procedure: CARDIOVERSION;  Surgeon: PaFay RecordsMD;  [facility];  Service: Cardiovascular;  Laterality: N/A;  ? CATARACT EXTRACTION W/ INTRAOCULAR LENS  IMPLANT, BILATERAL Bilateral   ? COLONOSCOPY WITH PROPOFOL N/A 04/22/2013  ? Procedure: COLONOSCOPY WITH PROPOFOL;  Surgeon: MaGarlan FairMD;  [facility]: [facility] ENDOSCOPY;  Service: Endoscopy;  Laterality: N/A;  ? DILATION AND CURETTAGE OF UTERUS  X 2  ? "when I was going thru  menopause"  ? ESOPHAGOGASTRODUODENOSCOPY (EGD) WITH PROPOFOL N/A 04/22/2013  ? Procedure: ESOPHAGOGASTRODUODENOSCOPY (EGD) WITH PROPOFOL;  Surgeon: MaGarlan FairMD;  [facility] ENDOSCOPY;  Service: Endoscopy;  Laterality: N/A;  ? HERNIA REPAIR    ? INCISIONAL HERNIA REPAIR    ? INSERT / REPLACE / REMOVE PACEMAKER  06/2007  ? "took out the old; put in new"  ? INSERT / REPLACE / REMOVE PACEMAKER  05/10/2014  ? MDT PPM generator change by Dr AlRayann Heman? JOINT REPLACEMENT    ? PARTIAL NEPHRECTOMY Left 05/1974  ? stone  disease  ? PERMANENT PACEMAKER GENERATOR CHANGE N/A 05/10/2014  ? Procedure: PERMANENT PACEMAKER GENERATOR CHANGE;  Surgeon: Thompson Grayer, MD;  Location[facility] C[facility] LAB;  Service: Cardiovascular;  Laterality: N/A;  ? TONSILLECTOMY AND ADENOIDECTOMY  1930's  ? TOTAL KNEE ARTHROPLASTY Right 2001  ? ? ? ?Family History  ?Problem Relation Age of Onset  ? Malignant hypertension Father   ? Hypertension Father   ? Renal Disease Father   ? Breast cancer Mother   ? Heart attack Brother   ? Stroke Brother   ? ? ? ?Social History  ? ?Socioeconomic History  ? Marital status: Widowed  ?  Spouse name: Not on file  ? Number of children: 2  ? Years of education: Not on file  ? Highest education level: Not on file  ?Occupational History  ? Occupation: RETIRED  ?  Employer: RETIRED  ?  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 Smoking status: Never  ? Smokeless tobacco: Never  ?Vaping Use  ? Vaping Use: Never used  ?Substance and Sexual Activity  ? Alcohol use: Yes  ?  Alcohol/week: 7.0 standard drinks  ?  Types: 7 Glasses of wine per week  ?  Comment: occasionally  ? Drug use: No  ? Sexual activity: Not Currently  ?  Partners: Male  ?Other Topics Concern  ? Not on file  ?Social History Narrative  ? Diet? Low salt, low fat  ?   ? Do you drink/eat things with caffeine? yes  ?   ? Marital status?                 married                   What year were you married? 1949  ?   ? Do you live in a house, apartment, assisted  living, condo, trailer, etc.? apartment  ?   ? Is it one or more stories? 3 stories  ?   ? How many persons live in your home? Just me  ?   ? Do you have any pets in your home? (please list) no  ?   ? Current or past

## 2021-08-13 NOTE — Patient Instructions (Signed)
Medication Instructions:  ?Your physician recommends that you continue on your current medications as directed. Please refer to the Current Medication list given to you today. ? ?Labwork: ?None ordered. ? ?Testing/Procedures: ?None ordered. ? ?Follow-Up: ?Your physician wants you to follow-up in: one year with Cristopher Peru, MD or one of the following Advanced Practice Providers on your designated Care Team:   ?Tommye Standard, PA-C ?Legrand Como "Jonni Sanger" Kraemer, PA-C ? ?Remote monitoring is used to monitor your Pacemaker from home. This monitoring reduces the number of office visits required to check your device to one time per year. It allows [place] to keep an eye on the functioning of your device to ensure it is working properly. You are scheduled for a device check from home on 09/06/2021. You may send your transmission at any time that day. If you have a wireless device, the transmission will be sent automatically. After your physician reviews your transmission, you will receive a postcard with your next transmission date. ? ?Any Other Special Instructions Will Be Listed Below (If Applicable). ? ?If you need a refill on your cardiac medications before your next appointment, please call your pharmacy.  ? ?Important Information About Sugar ? ? ? ? ? ? ? ?

## 2021-08-20 ENCOUNTER — Telehealth: Payer: Self-pay | Admitting: *Deleted

## 2021-08-20 MED ORDER — FEXOFENADINE HCL 180 MG PO TABS: 180.0000 mg | ORAL_TABLET | Freq: Every day | ORAL | 5 refills | Status: DC

## 2021-08-20 NOTE — Telephone Encounter (Signed)
She is taking this med for allergic rhinitis. Ok to fill.

## 2021-08-20 NOTE — Telephone Encounter (Signed)
Current medication list updated and Rx faxed to pharmacy.  ?

## 2021-08-20 NOTE — Telephone Encounter (Signed)
Received a Fax from [facility] requesting a refill on Fexofenadine '180mg'$  One Daily.  ? ?Medication is NOT in patient's current medication list.  ?Please Advise. ?

## 2021-09-05 ENCOUNTER — Other Ambulatory Visit: Payer: Self-pay

## 2021-09-05 NOTE — Telephone Encounter (Signed)
Fax came from [facility]. Patient request medication. Prescription pend and sent to Royal Hawthorn, NP for approval.

## 2021-09-06 ENCOUNTER — Ambulatory Visit (INDEPENDENT_AMBULATORY_CARE_PROVIDER_SITE_OTHER): Payer: Medicare Other

## 2021-09-06 DIAGNOSIS — I442 Atrioventricular block, complete: Secondary | ICD-10-CM | POA: Diagnosis not present

## 2021-09-06 MED ORDER — GUAIFENESIN ER 600 MG PO TB12: 600.0000 mg | ORAL_TABLET | Freq: Two times a day (BID) | ORAL | 0 refills | Status: DC

## 2021-09-08 LAB — CUP PACEART REMOTE DEVICE CHECK
Battery Impedance: 751 Ohm
Battery Remaining Longevity: 78 mo
Battery Voltage: 2.79 V
Brady Statistic RV Percent Paced: 97 %
Date Time Interrogation Session: 20230601102656
Implantable Lead Implant Date: 20090318
Implantable Lead Implant Date: 20090318
Implantable Lead Location: 753859
Implantable Lead Location: 753860
Implantable Lead Model: 5076
Implantable Lead Model: 5092
Implantable Pulse Generator Implant Date: 20160202
Lead Channel Impedance Value: 67 Ohm
Lead Channel Impedance Value: 690 Ohm
Lead Channel Pacing Threshold Amplitude: 1 V
Lead Channel Pacing Threshold Pulse Width: 0.4 ms
Lead Channel Setting Pacing Amplitude: 2.5 V
Lead Channel Setting Pacing Pulse Width: 0.4 ms
Lead Channel Setting Sensing Sensitivity: 4 mV

## 2021-09-18 NOTE — Progress Notes (Signed)
Remote pacemaker transmission.   

## 2021-09-24 ENCOUNTER — Other Ambulatory Visit: Payer: Self-pay | Admitting: *Deleted

## 2021-09-24 MED ORDER — AZELASTINE HCL 137 MCG/SPRAY NA SOLN: NASAL | 11 refills | Status: DC

## 2021-09-24 NOTE — Telephone Encounter (Signed)
Received fax from [facility] requesting refill.  Pended Rx and sent to [facility] for approval.

## 2021-10-10 DIAGNOSIS — M6389 Disorders of muscle in diseases classified elsewhere, multiple sites: Secondary | ICD-10-CM | POA: Diagnosis not present

## 2021-10-10 DIAGNOSIS — R278 Other lack of coordination: Secondary | ICD-10-CM | POA: Diagnosis not present

## 2021-10-10 DIAGNOSIS — R531 Weakness: Secondary | ICD-10-CM | POA: Diagnosis not present

## 2021-10-10 DIAGNOSIS — M25511 Pain in right shoulder: Secondary | ICD-10-CM | POA: Diagnosis not present

## 2021-10-12 DIAGNOSIS — M25511 Pain in right shoulder: Secondary | ICD-10-CM | POA: Diagnosis not present

## 2021-10-12 DIAGNOSIS — M6389 Disorders of muscle in diseases classified elsewhere, multiple sites: Secondary | ICD-10-CM | POA: Diagnosis not present

## 2021-10-12 DIAGNOSIS — R531 Weakness: Secondary | ICD-10-CM | POA: Diagnosis not present

## 2021-10-12 DIAGNOSIS — R278 Other lack of coordination: Secondary | ICD-10-CM | POA: Diagnosis not present

## 2021-10-15 DIAGNOSIS — R531 Weakness: Secondary | ICD-10-CM | POA: Diagnosis not present

## 2021-10-15 DIAGNOSIS — M25511 Pain in right shoulder: Secondary | ICD-10-CM | POA: Diagnosis not present

## 2021-10-15 DIAGNOSIS — R278 Other lack of coordination: Secondary | ICD-10-CM | POA: Diagnosis not present

## 2021-10-15 DIAGNOSIS — M6389 Disorders of muscle in diseases classified elsewhere, multiple sites: Secondary | ICD-10-CM | POA: Diagnosis not present

## 2021-10-18 DIAGNOSIS — M6389 Disorders of muscle in diseases classified elsewhere, multiple sites: Secondary | ICD-10-CM | POA: Diagnosis not present

## 2021-10-18 DIAGNOSIS — R278 Other lack of coordination: Secondary | ICD-10-CM | POA: Diagnosis not present

## 2021-10-18 DIAGNOSIS — R531 Weakness: Secondary | ICD-10-CM | POA: Diagnosis not present

## 2021-10-18 DIAGNOSIS — M25511 Pain in right shoulder: Secondary | ICD-10-CM | POA: Diagnosis not present

## 2021-10-23 DIAGNOSIS — R531 Weakness: Secondary | ICD-10-CM | POA: Diagnosis not present

## 2021-10-23 DIAGNOSIS — R278 Other lack of coordination: Secondary | ICD-10-CM | POA: Diagnosis not present

## 2021-10-23 DIAGNOSIS — M25511 Pain in right shoulder: Secondary | ICD-10-CM | POA: Diagnosis not present

## 2021-10-23 DIAGNOSIS — M6389 Disorders of muscle in diseases classified elsewhere, multiple sites: Secondary | ICD-10-CM | POA: Diagnosis not present

## 2021-10-25 DIAGNOSIS — M25511 Pain in right shoulder: Secondary | ICD-10-CM | POA: Diagnosis not present

## 2021-10-25 DIAGNOSIS — R278 Other lack of coordination: Secondary | ICD-10-CM | POA: Diagnosis not present

## 2021-10-25 DIAGNOSIS — R531 Weakness: Secondary | ICD-10-CM | POA: Diagnosis not present

## 2021-10-25 DIAGNOSIS — M6389 Disorders of muscle in diseases classified elsewhere, multiple sites: Secondary | ICD-10-CM | POA: Diagnosis not present

## 2021-10-29 ENCOUNTER — Other Ambulatory Visit: Payer: Self-pay | Admitting: Adult Health

## 2021-10-29 ENCOUNTER — Encounter: Payer: Self-pay | Admitting: Adult Health

## 2021-10-29 MED ORDER — DULERA 200-5 MCG/ACT IN AERO: 2.0000 | INHALATION_SPRAY | Freq: Two times a day (BID) | RESPIRATORY_TRACT | 11 refills | Status: DC

## 2021-10-29 NOTE — Progress Notes (Signed)
Pharmacy sent communication that current inhaler will no longer be covered. Will return to [facility].

## 2021-10-30 DIAGNOSIS — R278 Other lack of coordination: Secondary | ICD-10-CM | POA: Diagnosis not present

## 2021-10-30 DIAGNOSIS — M6389 Disorders of muscle in diseases classified elsewhere, multiple sites: Secondary | ICD-10-CM | POA: Diagnosis not present

## 2021-10-30 DIAGNOSIS — R531 Weakness: Secondary | ICD-10-CM | POA: Diagnosis not present

## 2021-10-30 DIAGNOSIS — M25511 Pain in right shoulder: Secondary | ICD-10-CM | POA: Diagnosis not present

## 2021-11-01 DIAGNOSIS — M25511 Pain in right shoulder: Secondary | ICD-10-CM | POA: Diagnosis not present

## 2021-11-01 DIAGNOSIS — R531 Weakness: Secondary | ICD-10-CM | POA: Diagnosis not present

## 2021-11-01 DIAGNOSIS — M6389 Disorders of muscle in diseases classified elsewhere, multiple sites: Secondary | ICD-10-CM | POA: Diagnosis not present

## 2021-11-01 DIAGNOSIS — R278 Other lack of coordination: Secondary | ICD-10-CM | POA: Diagnosis not present

## 2021-11-06 DIAGNOSIS — M6389 Disorders of muscle in diseases classified elsewhere, multiple sites: Secondary | ICD-10-CM | POA: Diagnosis not present

## 2021-11-06 DIAGNOSIS — M25511 Pain in right shoulder: Secondary | ICD-10-CM | POA: Diagnosis not present

## 2021-11-06 DIAGNOSIS — R278 Other lack of coordination: Secondary | ICD-10-CM | POA: Diagnosis not present

## 2021-11-06 DIAGNOSIS — R531 Weakness: Secondary | ICD-10-CM | POA: Diagnosis not present

## 2021-11-08 DIAGNOSIS — M25511 Pain in right shoulder: Secondary | ICD-10-CM | POA: Diagnosis not present

## 2021-11-08 DIAGNOSIS — R278 Other lack of coordination: Secondary | ICD-10-CM | POA: Diagnosis not present

## 2021-11-08 DIAGNOSIS — M6389 Disorders of muscle in diseases classified elsewhere, multiple sites: Secondary | ICD-10-CM | POA: Diagnosis not present

## 2021-11-08 DIAGNOSIS — R531 Weakness: Secondary | ICD-10-CM | POA: Diagnosis not present

## 2021-11-12 ENCOUNTER — Encounter: Payer: Self-pay | Admitting: Adult Health

## 2021-11-12 ENCOUNTER — Non-Acute Institutional Stay: Payer: Medicare Other | Admitting: Adult Health

## 2021-11-12 VITALS — BP 146/92 | HR 87 | Temp 97.3°F | Ht 63.0 in | Wt 133.0 lb

## 2021-11-12 DIAGNOSIS — I5031 Acute diastolic (congestive) heart failure: Secondary | ICD-10-CM

## 2021-11-12 DIAGNOSIS — I5032 Chronic diastolic (congestive) heart failure: Secondary | ICD-10-CM

## 2021-11-12 DIAGNOSIS — I495 Sick sinus syndrome: Secondary | ICD-10-CM | POA: Diagnosis not present

## 2021-11-12 DIAGNOSIS — J449 Chronic obstructive pulmonary disease, unspecified: Secondary | ICD-10-CM

## 2021-11-12 DIAGNOSIS — I10 Essential (primary) hypertension: Secondary | ICD-10-CM | POA: Diagnosis not present

## 2021-11-12 DIAGNOSIS — M19011 Primary osteoarthritis, right shoulder: Secondary | ICD-10-CM

## 2021-11-12 DIAGNOSIS — N1831 Chronic kidney disease, stage 3a: Secondary | ICD-10-CM | POA: Diagnosis not present

## 2021-11-12 DIAGNOSIS — G459 Transient cerebral ischemic attack, unspecified: Secondary | ICD-10-CM

## 2021-11-12 DIAGNOSIS — E039 Hypothyroidism, unspecified: Secondary | ICD-10-CM

## 2021-11-12 DIAGNOSIS — I951 Orthostatic hypotension: Secondary | ICD-10-CM

## 2021-11-12 NOTE — Progress Notes (Unsigned)
Location:  [facility]  POS: Clinic  Provider: Royal Hawthorn, ANP  Code Status: DNR Goals of Care:     07/02/2021    2:12 PM  Advanced Directives  Does Patient Have a Medical Advance Directive? Yes  Type of Paramedic of [place];Living will;Out of facility DNR (pink MOST or yellow form)  Does patient want to make changes to medical advance directive? No - Patient declined  Pre-existing out of facility DNR order (yellow form or pink MOST form) Yellow form placed in chart (order not valid for inpatient use);Pink MOST form placed in chart (order not valid for inpatient use)     Chief Complaint  Patient presents with   Medical Management of Chronic Issues    Patient returns to the clinic for her 3 month follow up.     HPI: Patient is a Jody Blake seen today for medical management of chronic diseases.  Present for a check up lives in [place] with home care support.   PMH significant for hearing loss, vision loss, CHF, pacemaker, hypothyroid, CKD, afib, rhinitis, GERD, HTN, TIA, never smoker chronic bronchitis/COPD, glaucoma  and blindness.  BP readings 67-893Y systolic, averages on the lower side most of the time. When she got out of the shower one day it was 79/40. She felt very tired. This only occurred one time. Feels sluggish when bp low.   Weight ranging 128-130, eating more pants are getting tighter. No worsening edema.   Has sob with exertion chronically which is unchanged.  Walks 4 halls per day in the afternoon. Feels the worse in the morning in terms of injury and nasal congestion  Has chronic cough with sputum production which is unchanged.     Past Medical History:  Diagnosis Date   Anemia    Aortic stenosis    a. Echo 09/06/12 EF 55-60%, no WMA, G2DD, Ao valve sclerosis w/ mod stenosis, LA mildly dilated, PA pressure 40mHg   Asthmatic bronchitis    Complete heart block (HCC)    s/p permanent pacemaker 06/27/1999 (Battery change 06/2007  and 2016).  s/p AV nodal ablation by Dr ARayann Heman2016.   COPD (chronic obstructive pulmonary disease) (HCC)    Dr. YAnnamaria Boots  DDD (degenerative disc disease)    Diastolic dysfunction    a. Echo 09/06/12 EF 55-60%, no WMA, G2DD, Ao valve sclerosis w/ mod stenosis, LA mildly dilated, PA pressure 468mg   Diverticulosis    DVT (deep vein thrombosis) in pregnancy 1954   a. LLE   Hypertension    Hypothyroidism    on medication   Macular degeneration    Dr. KuEliezer Bottom Osteoarthrosis, unspecified whether generalized or localized, other specified sites    PAF (paroxysmal atrial fibrillation) ([facility]   Stopped flecainide, on amiodarone but still has bouts of A FIB (mostly in mornings).    Pure hypercholesterolemia    Staghorn calculus    Left    Past Surgical History:  Procedure Laterality Date   APPENDECTOMY  ~ 1941   AV NODE ABLATION  05/10/2014   AV NODE ABLATION N/A 05/10/2014   Procedure: AV NODE ABLATION;  Surgeon: JaThompson GrayerMD;  [facility];  Service: Cardiovascular;  Laterality: N/A;   BUNIONECTOMY WITH HAMMERTOE RECONSTRUCTION Bilateral ~ 19[place]03/21/01   Medtronic PM implanted by Dr EdLeonia Reeves CARDIOVERSION N/A 12/02/2013   Procedure: CARDIOVERSION;  Surgeon: PaFay RecordsMD;  [facility] Service:  Cardiovascular;  Laterality: N/A;   CATARACT EXTRACTION W/ INTRAOCULAR LENS  IMPLANT, BILATERAL Bilateral    COLONOSCOPY WITH PROPOFOL N/A 04/22/2013   Procedure: COLONOSCOPY WITH PROPOFOL;  Surgeon: [name], MD;  [facility]: [facility]OSCOPY;  Service: Endoscopy;  Laterality: N/A;   DILATION AND CURETTAGE OF UTERUS  X 2   "when I was going thru menopause"   ESOPHAGOGASTRODUODENOSCOPY (EGD) WITH PROPOFOL N/A 04/22/2013   Procedure: ESOPHAGOGASTRODUODENOSCOPY (EGD) WITH PROPOFOL;  Surgeon: [name], MD;  [facility];  Service: Endoscopy;  Laterality: N/A;   HERNIA REPAIR     INCISIONAL HERNIA REPAIR     INSERT / REPLACE /  REMOVE PACEMAKER  06/2007   "took out the old; put in new"   INSERT / REPLACE / REMOVE PACEMAKER  05/10/2014   MDT PPM generator change by Dr [name]   JOINT REPLACEMENT     PARTIAL NEPHRECTOMY Left 05/1974   stone disease   PERMANENT PACEMAKER GENERATOR CHANGE N/A 05/10/2014   Procedure: PERMANENT PACEMAKER GENERATOR CHANGE;  Surgeon: Thompson Grayer, MD;  Location: [facility];  Service: Cardiovascular;  Laterality: N/A;   TONSILLECTOMY AND ADENOIDECTOMY  1930's   TOTAL KNEE ARTHROPLASTY Right 2001    Allergies  Allergen Reactions   Brimonidine Tartrate-Timolol Other (See Comments)    REACTION: systemic malaise amigen eye drop   Penicillins Hives   Breo Ellipta [Fluticasone Furoate-Vilanterol] Other (See Comments)    Ran blood pressure up Increased blood pressure   Vancomycin Other (See Comments)    Red man syndrome - Mild redness and discomfort. Able to complete initial dose.     Outpatient Encounter Medications as of 11/12/2021  Medication Sig   albuterol (VENTOLIN HFA) 108 (90 Base) MCG/ACT inhaler Inhale 2 puffs into the lungs every 6 (six) hours as needed for wheezing or shortness of breath.   Azelastine HCl 137 MCG/SPRAY SOLN USE 1 TO 2 SPRAYS INTO BOTH NOSTRILS TWICE DAILY.   Calcium 600-10 MG-MCG CHEW Chew 1 capsule by mouth daily.   Cholecalciferol (VITAMIN D) 125 MCG (5000 UT) CAPS Take 1 capsule by mouth daily.   COVID-19 mRNA bivalent vaccine, Moderna, (MODERNA COVID-19 BIVAL BOOSTER) 50 MCG/0.5ML injection Inject into the muscle.   dorzolamide (TRUSOPT) 2 % ophthalmic solution 1 drop 2 (two) times daily.   famotidine (PEPCID) 40 MG tablet Take 1 tablet (40 mg total) by mouth at bedtime.   fexofenadine (ALLEGRA) 180 MG tablet Take 1 tablet (180 mg total) by mouth daily.   fluticasone (FLONASE) 50 MCG/ACT nasal spray USE 2 SPRAYS EACH NOSTRIL ONCE A DAY AS NEEDED FOR ALLERGIES OR CONGESTION.   gabapentin (NEURONTIN) 100 MG capsule Take 1 capsule (100 mg total) by mouth at  bedtime.   guaiFENesin (MUCINEX) 600 MG 12 hr tablet Take 1 tablet (600 mg total) by mouth 2 (two) times daily.   latanoprost (XALATAN) 0.005 % ophthalmic solution Place 1 drop into both eyes at bedtime.   levalbuterol (XOPENEX) 0.63 MG/3ML nebulizer solution Take 3 mLs (0.63 mg total) by nebulization 2 (two) times daily as needed for wheezing or shortness of breath.   levalbuterol (XOPENEX) 0.63 MG/3ML nebulizer solution Take 3 mLs (0.63 mg total) by nebulization in the morning and at bedtime.   levothyroxine (SYNTHROID) 75 MCG tablet Take 1 tablet (75 mcg total) by mouth daily.   loratadine (CLARITIN) 10 MG tablet Take 1 tablet (10 mg total) by mouth daily.   losartan (COZAAR) 100 MG tablet Take 1 tablet (100 mg total) by mouth  daily.   Lutein 20 MG CAPS Take 1 capsule (20 mg total) by mouth daily.   Magnesium 250 MG TABS Take 1 tablet by mouth daily.   metoprolol tartrate (LOPRESSOR) 50 MG tablet Take 1.5 tablets (75 mg total) by mouth in the morning.   mometasone-formoterol (DULERA) 200-5 MCG/ACT AERO Inhale 2 puffs into the lungs in the morning and at bedtime.   montelukast (SINGULAIR) 10 MG tablet Take 1 tablet (10 mg total) by mouth daily.   Multiple Vitamins-Minerals (PRESERVISION AREDS 2) CAPS Take 1 capsule by mouth in the morning and at bedtime.   potassium chloride (KLOR-CON) 10 MEQ tablet Take 2 tablets (20 mEq total) by mouth daily.   predniSONE (DELTASONE) 5 MG tablet Take 1 tablet (5 mg total) by mouth daily with breakfast.   Rivaroxaban (XARELTO) 15 MG TABS tablet TAKE 1 TABLET BY MOUTH  DAILY WITH SUPPER   sennosides-docusate sodium (SENOKOT-S) 8.6-50 MG tablet Take 2 tablets by mouth at bedtime.   torsemide (DEMADEX) 10 MG tablet Take 1 tablet (10 mg total) by mouth daily.   torsemide (DEMADEX) 20 MG tablet Take 1 tablet (20 mg total) by mouth daily as needed. 3 lb weight gain in 1 day or 5 lb in 1 week or excessive edema   torsemide (DEMADEX) 20 MG tablet Take 1 tablet (20 mg  total) by mouth daily.   No facility-administered encounter medications on file as of 11/12/2021.    Review of Systems:  Review of Systems  Constitutional:  Negative for activity change, appetite change, chills, diaphoresis, fatigue, fever and unexpected weight change.  HENT:  Positive for hearing loss and postnasal drip. Negative for congestion, sneezing, sore throat and trouble swallowing.   Respiratory:  Positive for cough (chronic with sputum production) and shortness of breath (mild with exertion chronic not worse). Negative for wheezing.   Cardiovascular:  Positive for leg swelling. Negative for chest pain and palpitations.  Gastrointestinal:  Negative for abdominal distention, abdominal pain, constipation and diarrhea.  Genitourinary:  Negative for difficulty urinating and dysuria.  Musculoskeletal:  Negative for back pain, gait problem, joint swelling and myalgias.       Right shoulder pain  Neurological:  Negative for dizziness, tremors, seizures, syncope, facial asymmetry, speech difficulty, weakness, light-headedness, numbness and headaches.  Psychiatric/Behavioral:  Negative for agitation, behavioral problems and confusion.     Health Maintenance  Topic Date Due   INFLUENZA VACCINE  11/06/2021   COVID-19 Vaccine (5 - Moderna series) 12/10/2021   TETANUS/TDAP  02/23/2029   Pneumonia Vaccine 42+ Years old  Completed   Zoster Vaccines- Shingrix  Completed   HPV VACCINES  Aged Out   DEXA SCAN  Discontinued    Physical Exam: Vitals:   11/12/21 1342  Weight: 133 lb (60.3 kg)  Height: '5\' 3"'$  (1.6 m)   Body mass index is 23.56 kg/m. Physical Exam Vitals and nursing note reviewed.  Constitutional:      General: She is not in acute distress.    Appearance: She is not diaphoretic.  HENT:     Head: Normocephalic and atraumatic.     Right Ear: Tympanic membrane normal.     Left Ear: Tympanic membrane normal.     Nose: Nose normal.     Mouth/Throat:     Mouth: Mucous  membranes are moist.     Pharynx: Oropharynx is clear. No oropharyngeal exudate.  Eyes:     Conjunctiva/sclera: Conjunctivae normal.     Pupils: Pupils are equal, round, and reactive  to light.  Neck:     Vascular: No JVD.  Cardiovascular:     Rate and Rhythm: Normal rate and regular rhythm.     Heart sounds: No murmur heard. Pulmonary:     Effort: Pulmonary effort is normal. No respiratory distress.     Breath sounds: Normal breath sounds. No wheezing.  Abdominal:     General: Bowel sounds are normal. There is no distension.     Palpations: Abdomen is soft.     Tenderness: There is no abdominal tenderness.  Musculoskeletal:     Comments: Trace edema to both ankles.   Skin:    General: Skin is warm and dry.  Neurological:     Mental Status: She is alert and oriented to person, place, and time.  Psychiatric:        Mood and Affect: Mood normal.     Labs reviewed: Basic Metabolic Panel: Recent Labs    04/12/21 0426 04/13/21 0401 04/14/21 0357 04/17/21 0301 04/18/21 0354 04/23/21 0000 05/18/21 0000 06/19/21 0000 06/28/21 0000  NA 137   < > 138 139 141   < > 143 136* 145  145  K 3.0*   < > 3.9 3.4* 3.8   < > 3.9 3.3* 4.0  4.0  CL 99   < > 105 108 107   < > 103 99 101  101  CO2 26   < > '26 22 25   '$ < > 27* 29* 30*  30*  GLUCOSE 107*   < > 154* 97 83  --   --   --   --   BUN 16   < > 19 24* 30*   < > 23* 23* 25*  25*  CREATININE 1.03*   < > 0.76 0.84 1.04*   < > 0.9 1.2* 1.0  1.0  CALCIUM 8.9   < > 9.5 8.8* 9.1   < > 9.9 9.6 10.1  10.1  MG 2.1  --  2.2  --  2.5*  --   --   --  2.6  2.6  TSH 0.317*  --   --   --   --   --  0.56  --   --    < > = values in this interval not displayed.   Liver Function Tests: Recent Labs    12/21/20 0000 04/11/21 0950 06/19/21 0000  AST '24 30 24  '$ ALT 18 37 19  ALKPHOS 72 99 63  BILITOT  --  1.0  --   PROT  --  6.8  --   ALBUMIN 4.3 3.6 4.2   No results for input(s): "LIPASE", "AMYLASE" in the last 8760 hours. No results  for input(s): "AMMONIA" in the last 8760 hours. CBC: Recent Labs    04/11/21 0950 04/12/21 0426 04/13/21 0401 04/17/21 0301 04/23/21 0000 05/18/21 0000 06/19/21 0000  WBC 11.7* 13.4* 11.8* 11.5* 10.2 5.9 5.5  NEUTROABS 9.7* 10.7*  --   --   --   --   --   HGB 11.7* 11.5* 11.1* 12.5 13.2 11.6* 12.5  HCT 36.7 36.0 35.0* 38.9 42 37 38  MCV 98.4 98.4 99.2 97.5  --   --   --   PLT 271 308 306 407* 355 210 193   Lipid Panel: No results for input(s): "CHOL", "HDL", "LDLCALC", "TRIG", "CHOLHDL", "LDLDIRECT" in the last 8760 hours. Lab Results  Component Value Date   HGBA1C 5.7 (H) 06/25/2017    Procedures since last visit: No  results found.  Assessment/Plan  1. Orthostatic hypotension Happened after getting out of the shower Will check labs May reduce losartan once labs are reviewed.   2. Chronic diastolic congestive heart failure (HCC) Weight is slightly up but does not appear to be fluid related Continue torsemide Continue arb therapy, will make adjustment due to low readings once lab reviewed.  Followed by cardiology  3. Acquired hypothyroidism Will check TSH again due to labile numbers   5. COPD mixed type [facility]) Followed Dr [name] No recent exacerbation, doing well.  Continue Dulera and prn albuterol   6. Osteoarthritis of right glenohumeral joint And rotator cuff injury Working with therapy Can take tylenol if needed Avoid nsaids.    7. Stage 3a chronic kidney disease ([place]) Continue to periodically monitor BMP and avoid nephrotoxic agents  8. Sick sinus syndrome [facility]) S/p pacemaker Remote check done in June  9. Essential hypertension Controlled most of the time with occasional low number See above.   10. TIA (transient ischemic attack) Prior hx  On xarelto    Labs/tests ordered:  * No order type specified * CBC CMP TSH in am Next appt:  3 months with Dr Lyndel Safe    Total time 68mn:  time greater than 50% of total time spent doing pt counseling  and coordination of care

## 2021-11-12 NOTE — Patient Instructions (Signed)
Maintain 1200 ml fluid restriction

## 2021-11-13 DIAGNOSIS — M6389 Disorders of muscle in diseases classified elsewhere, multiple sites: Secondary | ICD-10-CM | POA: Diagnosis not present

## 2021-11-13 DIAGNOSIS — R531 Weakness: Secondary | ICD-10-CM | POA: Diagnosis not present

## 2021-11-13 DIAGNOSIS — M25511 Pain in right shoulder: Secondary | ICD-10-CM | POA: Diagnosis not present

## 2021-11-13 DIAGNOSIS — R278 Other lack of coordination: Secondary | ICD-10-CM | POA: Diagnosis not present

## 2021-11-15 ENCOUNTER — Telehealth: Payer: Self-pay | Admitting: Adult Health

## 2021-11-15 DIAGNOSIS — R531 Weakness: Secondary | ICD-10-CM | POA: Diagnosis not present

## 2021-11-15 DIAGNOSIS — I5032 Chronic diastolic (congestive) heart failure: Secondary | ICD-10-CM | POA: Diagnosis not present

## 2021-11-15 DIAGNOSIS — R278 Other lack of coordination: Secondary | ICD-10-CM | POA: Diagnosis not present

## 2021-11-15 DIAGNOSIS — M6389 Disorders of muscle in diseases classified elsewhere, multiple sites: Secondary | ICD-10-CM | POA: Diagnosis not present

## 2021-11-15 DIAGNOSIS — E039 Hypothyroidism, unspecified: Secondary | ICD-10-CM | POA: Diagnosis not present

## 2021-11-15 DIAGNOSIS — M25511 Pain in right shoulder: Secondary | ICD-10-CM | POA: Diagnosis not present

## 2021-11-15 LAB — CBC AND DIFFERENTIAL
HCT: 39 (ref 36–46)
Hemoglobin: 13.1 (ref 12.0–16.0)
Platelets: 249 10*3/uL (ref 150–400)
WBC: 6.9

## 2021-11-15 LAB — HEPATIC FUNCTION PANEL
ALT: 41 U/L — AB (ref 7–35)
AST: 35 (ref 13–35)
Alkaline Phosphatase: 60 (ref 25–125)
Bilirubin, Total: 0.7

## 2021-11-15 LAB — BASIC METABOLIC PANEL
BUN: 49 — AB (ref 4–21)
CO2: 28 — AB (ref 13–22)
Chloride: 98 — AB (ref 99–108)
Creatinine: 1.5 — AB (ref 0.5–1.1)
Glucose: 89
Potassium: 3.8 mEq/L (ref 3.5–5.1)
Sodium: 146 (ref 137–147)

## 2021-11-15 LAB — CBC: RBC: 4 (ref 3.87–5.11)

## 2021-11-15 LAB — COMPREHENSIVE METABOLIC PANEL
Albumin: 4.4 (ref 3.5–5.0)
Calcium: 10.1 (ref 8.7–10.7)
Globulin: 2.2
eGFR: 32

## 2021-11-15 LAB — TSH: TSH: 2.57 (ref 0.41–5.90)

## 2021-11-15 NOTE — Telephone Encounter (Signed)
BMP returned 11/15/2021  NA 146, BUN 48.9 Cr 1.52 Na 146 K 3.8   Pt appears slightly dry on labs. Would reduce torsemide from 30 mg daily to 20 mg daily.  Repeat BMP in 1 week This may be the reason for her low bp when standing.

## 2021-11-15 NOTE — Telephone Encounter (Signed)
Royal Hawthorn, NP  [facility] Clinical Pool 9 minutes ago (4:12 PM)   Please call Ms. Partain and her caregiver and let them know that we need to reduce the torsemide as documented due elevated sodium and BUN. [place] [facility] will let the nurse know as well.        Tried calling patient, LMOM to return call.

## 2021-11-16 NOTE — Telephone Encounter (Signed)
Patient notified and agreed.  

## 2021-11-19 DIAGNOSIS — Z961 Presence of intraocular lens: Secondary | ICD-10-CM | POA: Diagnosis not present

## 2021-11-19 DIAGNOSIS — H353133 Nonexudative age-related macular degeneration, bilateral, advanced atrophic without subfoveal involvement: Secondary | ICD-10-CM | POA: Diagnosis not present

## 2021-11-19 DIAGNOSIS — H52202 Unspecified astigmatism, left eye: Secondary | ICD-10-CM | POA: Diagnosis not present

## 2021-11-19 DIAGNOSIS — D3132 Benign neoplasm of left choroid: Secondary | ICD-10-CM | POA: Diagnosis not present

## 2021-11-20 DIAGNOSIS — R531 Weakness: Secondary | ICD-10-CM | POA: Diagnosis not present

## 2021-11-20 DIAGNOSIS — M25511 Pain in right shoulder: Secondary | ICD-10-CM | POA: Diagnosis not present

## 2021-11-20 DIAGNOSIS — M6389 Disorders of muscle in diseases classified elsewhere, multiple sites: Secondary | ICD-10-CM | POA: Diagnosis not present

## 2021-11-20 DIAGNOSIS — R278 Other lack of coordination: Secondary | ICD-10-CM | POA: Diagnosis not present

## 2021-11-22 ENCOUNTER — Telehealth: Payer: Medicare Other | Admitting: Adult Health

## 2021-11-22 DIAGNOSIS — R278 Other lack of coordination: Secondary | ICD-10-CM | POA: Diagnosis not present

## 2021-11-22 DIAGNOSIS — R531 Weakness: Secondary | ICD-10-CM | POA: Diagnosis not present

## 2021-11-22 DIAGNOSIS — M25511 Pain in right shoulder: Secondary | ICD-10-CM | POA: Diagnosis not present

## 2021-11-22 DIAGNOSIS — M6389 Disorders of muscle in diseases classified elsewhere, multiple sites: Secondary | ICD-10-CM | POA: Diagnosis not present

## 2021-11-22 MED ORDER — LOSARTAN POTASSIUM 50 MG PO TABS: 50.0000 mg | ORAL_TABLET | Freq: Every day | ORAL | 5 refills | Status: DC

## 2021-11-22 NOTE — Telephone Encounter (Signed)
Clinic nurse reports that she is having increased edema, cough, and 2 lb weight gain. Torsemide was recently decreased from 30 to 20 mg due to low bp and elevated NA. Will recommend 1 dose of prn torsemide with scheduled dosing but reduce losartan to 50 mg daily due to bp 108/67

## 2021-11-27 DIAGNOSIS — M25511 Pain in right shoulder: Secondary | ICD-10-CM | POA: Diagnosis not present

## 2021-11-27 DIAGNOSIS — M6389 Disorders of muscle in diseases classified elsewhere, multiple sites: Secondary | ICD-10-CM | POA: Diagnosis not present

## 2021-11-27 DIAGNOSIS — R278 Other lack of coordination: Secondary | ICD-10-CM | POA: Diagnosis not present

## 2021-11-27 DIAGNOSIS — R531 Weakness: Secondary | ICD-10-CM | POA: Diagnosis not present

## 2021-11-28 ENCOUNTER — Ambulatory Visit (INDEPENDENT_AMBULATORY_CARE_PROVIDER_SITE_OTHER): Payer: Medicare Other | Admitting: Family

## 2021-11-28 ENCOUNTER — Encounter: Payer: Self-pay | Admitting: Family

## 2021-11-28 ENCOUNTER — Ambulatory Visit
Admission: RE | Admit: 2021-11-28 | Discharge: 2021-11-28 | Disposition: A | Discharge status: Home / Self Care | Payer: Medicare Other | Source: Ambulatory Visit | Attending: Family | Admitting: Family

## 2021-11-28 ENCOUNTER — Other Ambulatory Visit: Payer: Self-pay | Admitting: *Deleted

## 2021-11-28 VITALS — BP 116/60 | HR 63 | Temp 97.4°F | Resp 18 | Ht 63.0 in | Wt 134.0 lb

## 2021-11-28 DIAGNOSIS — R0602 Shortness of breath: Secondary | ICD-10-CM | POA: Diagnosis not present

## 2021-11-28 DIAGNOSIS — R051 Acute cough: Secondary | ICD-10-CM

## 2021-11-28 DIAGNOSIS — R600 Localized edema: Secondary | ICD-10-CM

## 2021-11-28 DIAGNOSIS — R059 Cough, unspecified: Secondary | ICD-10-CM | POA: Diagnosis not present

## 2021-11-28 MED ORDER — VITAMIN D 125 MCG (5000 UT) PO CAPS: 1.0000 | ORAL_CAPSULE | Freq: Every day | ORAL | 11 refills | Status: DC

## 2021-11-28 MED ORDER — FAMOTIDINE 40 MG PO TABS: 40.0000 mg | ORAL_TABLET | Freq: Every day | ORAL | 11 refills | Status: DC

## 2021-11-28 NOTE — Telephone Encounter (Signed)
[facility] Nurse at [facility] me Blister packets to refill.   Showed her that these were refilled on 08/06/21 for #90 with 3 refills (years worth)  Pharmacy did not send to her and states that they have no more refills.   Heather requested it to be refilled for #30 with 11 refills due to space in her med cart.   Refilled as requested. Pended Rxs and sent to Dr. Lyndel Safe for approval due to [place].

## 2021-11-28 NOTE — Patient Instructions (Addendum)
Take extra torsemide 20 mg tablet today.   - Please get chest X-ray at [place] [facility] then will call you with results.

## 2021-11-28 NOTE — Progress Notes (Signed)
Provider: Mailee Klaas FNP-C  [name], MD  Patient Care Team: [name], MD as PCP - General (Internal Medicine) Jerline Pain, MD as PCP - Cardiology (Cardiology) Thompson Grayer, MD as Consulting Physician (Cardiology) Deneise Lever, MD as Consulting Physician (Pulmonary Disease) Rutherford Guys, MD as Consulting Physician (Ophthalmology) [name], MD as Consulting Physician (Gastroenterology) Gaynelle Arabian, MD as Consulting Physician (Orthopedic Surgery)  Extended Emergency Contact Information Primary Emergency Contact: [name] of [place] Phone: [phone] Mobile Phone: [phone] Relation: Daughter Secondary Emergency Contact: [name] of [place] Phone: [phone] Mobile Phone: [phone] Relation: Son  Code Status:  DNR Goals of care: Advanced Directive information    07/02/2021    2:12 PM  Advanced Directives  Does Patient Have a Medical Advance Directive? Yes  Type of Paramedic of [place];Living will;Out of facility DNR (pink MOST or yellow form)  Does patient want to make changes to medical advance directive? No - Patient declined  Pre-existing out of facility DNR order (yellow form or pink MOST form) Yellow form placed in chart (order not valid for inpatient use);Pink MOST form placed in chart (order not valid for inpatient use)     Chief Complaint  Patient presents with   Acute Visit    Patient is here for edema and cough, covid negative, decrease in torsemide medication 1 week ago    HPI:  Pt is a 88 y.o. female seen today for an acute visit for evaluation of cough.she resides in [facility] facility.COVID-19 test was negative.states torsemide recently decreased from 30 mg to 20 mg tablet due to low blood pressure.Has taken extra Torsemide 20 mg tablet for the past three days since she had 2 lbs abrupt weight. Care giver states was  away for the past four day pt did not wear her Ted hose. Coughs up phlegm.short of breath with exertion. denies any headache,dizziness,vision changes,fatigue,chest tightness,palpitation or chest pain        Past Medical History:  Diagnosis Date   Anemia    Aortic stenosis    a. Echo 09/06/12 EF 55-60%, no WMA, G2DD, Ao valve sclerosis w/ mod stenosis, LA mildly dilated, PA pressure 44mHg   Asthmatic bronchitis    Complete heart block (HCC)    s/p permanent pacemaker 06/27/1999 (Battery change 06/2007 and 2016).  s/p AV nodal ablation by Dr ARayann Heman2016.   COPD (chronic obstructive pulmonary disease) (HCC)    Dr. YAnnamaria Boots  DDD (degenerative disc disease)    Diastolic dysfunction    a. Echo 09/06/12 EF 55-60%, no WMA, G2DD, Ao valve sclerosis w/ mod stenosis, LA mildly dilated, PA pressure 48mg   Diverticulosis    DVT (deep vein thrombosis) in pregnancy 1954   a. LLE   Hypertension    Hypothyroidism    on medication   Macular degeneration    Dr. KuEliezer Bottom Osteoarthrosis, unspecified whether generalized or localized, other specified sites    [place]mal [place]) [place]   Stopped flecainide, on amiodarone but still has bouts of A FIB (mostly in mornings).    Pure hypercholesterolemia    Staghorn calculus    Left   Past Surgical History:  Procedure Laterality Date   APPENDECTOMY  ~ 1941   AV NODE ABLATION  05/10/2014   AV NODE ABLATION N/A 05/10/2014   Procedure: AV NODE ABLATION;  Surgeon: JaThompson GrayerMD;  [facility];  Service: Cardiovascular;  Laterality: N/A;   BUNIONECTOMY WITH HAMMERTOE RECONSTRUCTION Bilateral ~ [place]  06/27/99   Medtronic PM implanted by Dr [name]   CARDIOVERSION N/A 12/02/2013   Procedure: CARDIOVERSION;  Surgeon: [name], MD;  Location: [facility];  Service: Cardiovascular;  Laterality: N/A;   CATARACT EXTRACTION W/ INTRAOCULAR LENS  IMPLANT, BILATERAL Bilateral    COLONOSCOPY WITH PROPOFOL N/A  04/22/2013   Procedure: COLONOSCOPY WITH PROPOFOL;  Surgeon: [name], MD;  Location: [facility]OSCOPY;  Service: Endoscopy;  Laterality: N/A;   DILATION AND CURETTAGE OF UTERUS  X 2   "when I was going thru menopause"   ESOPHAGOGASTRODUODENOSCOPY (EGD) WITH PROPOFOL N/A 04/22/2013   Procedure: ESOPHAGOGASTRODUODENOSCOPY (EGD) WITH PROPOFOL;  Surgeon: [name], MD;  Location: [facility];  Service: Endoscopy;  Laterality: N/A;   HERNIA REPAIR     INCISIONAL HERNIA REPAIR     INSERT / REPLACE / REMOVE PACEMAKER  06/2007   "took out the old; put in new"   INSERT / REPLACE / REMOVE PACEMAKER  05/10/2014   MDT PPM generator change by Dr [name]   JOINT REPLACEMENT     PARTIAL NEPHRECTOMY Left 05/1974   stone disease   PERMANENT PACEMAKER GENERATOR CHANGE N/A 05/10/2014   Procedure: PERMANENT PACEMAKER GENERATOR CHANGE;  Surgeon: Thompson Grayer, MD;  Location: [facility] CATH LAB;  Service: Cardiovascular;  Laterality: N/A;   TONSILLECTOMY AND ADENOIDECTOMY  1930's   TOTAL KNEE ARTHROPLASTY Right 2001    Allergies  Allergen Reactions   Brimonidine Tartrate-Timolol Other (See Comments)    REACTION: systemic malaise amigen eye drop   Penicillins Hives   Breo Ellipta [Fluticasone Furoate-Vilanterol] Other (See Comments)    Ran blood pressure up Increased blood pressure   Vancomycin Other (See Comments)    Red man syndrome - Mild redness and discomfort. Able to complete initial dose.     Outpatient Encounter Medications as of 11/28/2021  Medication Sig   albuterol (VENTOLIN HFA) 108 (90 Base) MCG/ACT inhaler Inhale 2 puffs into the lungs every 6 (six) hours as needed for wheezing or shortness of breath.   Azelastine HCl 137 MCG/SPRAY SOLN USE 1 TO 2 SPRAYS INTO BOTH NOSTRILS TWICE DAILY.   budesonide-formoterol (SYMBICORT) 160-4.5 MCG/ACT inhaler 2 puffs 2 (two) times daily.   Calcium 600-10 MG-MCG CHEW Chew 1 capsule by mouth daily.   Cholecalciferol (VITAMIN D) 125 MCG (5000 UT) CAPS Take 1  capsule by mouth daily.   dorzolamide (TRUSOPT) 2 % ophthalmic solution 1 drop 2 (two) times daily.   famotidine (PEPCID) 40 MG tablet Take 1 tablet (40 mg total) by mouth at bedtime.   fexofenadine (ALLEGRA) 180 MG tablet Take 1 tablet (180 mg total) by mouth daily.   fluticasone (FLONASE) 50 MCG/ACT nasal spray USE 2 SPRAYS EACH NOSTRIL ONCE A DAY AS NEEDED FOR ALLERGIES OR CONGESTION.   gabapentin (NEURONTIN) 100 MG capsule Take 1 capsule (100 mg total) by mouth at bedtime.   guaiFENesin (MUCINEX) 600 MG 12 hr tablet Take 1 tablet (600 mg total) by mouth 2 (two) times daily.   ipratropium-albuterol (DUONEB) 0.5-2.5 (3) MG/3ML SOLN Take 3 mLs by nebulization 3 (three) times daily as needed.   latanoprost (XALATAN) 0.005 % ophthalmic solution Place 1 drop into both eyes at bedtime.   levalbuterol (XOPENEX) 0.63 MG/3ML nebulizer solution Take 3 mLs (0.63 mg total) by nebulization 2 (two) times daily as needed for wheezing or shortness of breath.   levalbuterol (XOPENEX) 0.63 MG/3ML nebulizer solution  Take 3 mLs (0.63 mg total) by nebulization in the morning and at bedtime.   levothyroxine (SYNTHROID) 75 MCG tablet Take 1 tablet (75 mcg total) by mouth daily.   loratadine (CLARITIN) 10 MG tablet Take 1 tablet (10 mg total) by mouth daily.   losartan (COZAAR) 50 MG tablet Take 1 tablet (50 mg total) by mouth daily.   Lutein 20 MG CAPS Take 1 capsule (20 mg total) by mouth daily.   Magnesium 250 MG TABS Take 1 tablet by mouth daily.   metoprolol tartrate (LOPRESSOR) 50 MG tablet Take 1.5 tablets (75 mg total) by mouth in the morning.   mometasone-formoterol (DULERA) 200-5 MCG/ACT AERO Inhale 2 puffs into the lungs in the morning and at bedtime.   montelukast (SINGULAIR) 10 MG tablet Take 1 tablet (10 mg total) by mouth daily.   Multiple Vitamins-Minerals (PRESERVISION AREDS 2) CAPS Take 1 capsule by mouth in the morning and at bedtime.   potassium chloride (KLOR-CON) 10 MEQ tablet Take 2 tablets (20  mEq total) by mouth daily.   predniSONE (DELTASONE) 5 MG tablet Take 1 tablet (5 mg total) by mouth daily with breakfast.   Rivaroxaban (XARELTO) 15 MG TABS tablet TAKE 1 TABLET BY MOUTH  DAILY WITH SUPPER   sennosides-docusate sodium (SENOKOT-S) 8.6-50 MG tablet Take 2 tablets by mouth at bedtime.   torsemide (DEMADEX) 20 MG tablet Take 1 tablet (20 mg total) by mouth daily as needed. 3 lb weight gain in 1 day or 5 lb in 1 week or excessive edema   torsemide (DEMADEX) 20 MG tablet Take 1 tablet (20 mg total) by mouth daily.   [DISCONTINUED] Cholecalciferol (VITAMIN D) 125 MCG (5000 UT) CAPS Take 1 capsule by mouth daily.   [DISCONTINUED] famotidine (PEPCID) 40 MG tablet Take 1 tablet (40 mg total) by mouth at bedtime.   No facility-administered encounter medications on file as of 11/28/2021.    Review of Systems  Constitutional:  Negative for appetite change, chills, fatigue and fever.  Respiratory:  Positive for cough.        Shortness of breath with exertion   Cardiovascular:  Positive for leg swelling. Negative for chest pain and palpitations.  Gastrointestinal:  Negative for abdominal distention, abdominal pain, diarrhea, nausea and vomiting.    Immunization History  Administered Date(s) Administered   Influenza Split 01/07/2011   Influenza Whole 01/16/2009, 01/23/2010   Influenza, High Dose Seasonal PF 01/06/2013, 01/15/2019, 02/04/2020   Influenza,inj,Quad PF,6+ Mos 02/17/2015, 01/30/2018   Influenza,inj,quad, With Preservative 01/06/2017   Influenza-Unspecified 01/06/2014, 02/01/2016, 02/04/2020, 01/26/2021   Moderna Covid-19 Vaccine Bivalent Booster 76yr & up 08/09/2021   Moderna SARS-COV2 Booster Vaccination 08/18/2020   Moderna Sars-Covid-2 Vaccination 04/20/2019, 05/18/2019, 02/22/2020   Pneumococcal Conjugate-13 02/24/2019   Pneumococcal Polysaccharide-23 11/23/2013   Tdap 02/24/2019   Zoster Recombinat (Shingrix) 03/13/2017, 05/20/2017   Pertinent  Health Maintenance  Due  Topic Date Due   INFLUENZA VACCINE  11/06/2021   DEXA SCAN  Discontinued      04/16/2021   10:00 PM 04/17/2021    8:20 AM 04/17/2021    7:10 PM 05/09/2021   11:07 AM 07/02/2021    2:11 PM  Fall Risk  Falls in the past year?    0 0  Was there an injury with Fall?    0 0  Fall Risk Category Calculator    0 0  Fall Risk Category    Low Low  Patient Fall Risk Level High fall risk High fall risk High fall  risk Moderate fall risk Low fall risk  Patient at Risk for Falls Due to    No Fall Risks;Impaired balance/gait No Fall Risks  Fall risk Follow up    Falls evaluation completed Falls evaluation completed   Functional Status Survey:    Vitals:   11/28/21 1330  BP: 116/60  Pulse: 63  Resp: 18  Temp: (!) 97.4 F (36.3 C)  TempSrc: Temporal  SpO2: 98%  Weight: 134 lb (60.8 kg)  Height: '5\' 3"'$  (1.6 m)   Body mass index is 23.74 kg/m. Physical Exam Vitals reviewed.  Constitutional:      General: She is not in acute distress.    Appearance: Normal appearance. She is normal weight. She is not ill-appearing or diaphoretic.  HENT:     Head: Normocephalic.     Right Ear: Tympanic membrane, ear canal and external ear normal. There is no impacted cerumen.     Left Ear: Tympanic membrane, ear canal and external ear normal. There is no impacted cerumen.     Nose: Nose normal. No congestion or rhinorrhea.     Mouth/Throat:     Mouth: Mucous membranes are moist.     Pharynx: Oropharynx is clear. No oropharyngeal exudate or posterior oropharyngeal erythema.  Eyes:     General: No scleral icterus.       Right eye: No discharge.        Left eye: No discharge.     Extraocular Movements: Extraocular movements intact.     Conjunctiva/sclera: Conjunctivae normal.     Pupils: Pupils are equal, round, and reactive to light.  Neck:     Vascular: No carotid bruit.  Cardiovascular:     Rate and Rhythm: Normal rate and regular rhythm.     Pulses: Normal pulses.     Heart sounds: Normal heart  sounds. No murmur heard.    No friction rub. No gallop.  Pulmonary:     Effort: Pulmonary effort is normal. No respiratory distress.     Breath sounds: Examination of the right-lower field reveals decreased breath sounds. Examination of the left-lower field reveals decreased breath sounds. Decreased breath sounds present. No wheezing, rhonchi or rales.  Chest:     Chest wall: No tenderness.  Abdominal:     General: Bowel sounds are normal. There is no distension.     Palpations: Abdomen is soft. There is no mass.     Tenderness: There is no abdominal tenderness. There is no right CVA tenderness, left CVA tenderness, guarding or rebound.  Musculoskeletal:        General: No swelling or tenderness. Normal range of motion.     Cervical back: Normal range of motion. No rigidity or tenderness.     Right lower leg: Edema present.     Left lower leg: Edema present.  Lymphadenopathy:     Cervical: No cervical adenopathy.  Skin:    General: Skin is warm and dry.     Coloration: Skin is not pale.     Findings: No bruising, erythema, lesion or rash.  Neurological:     Mental Status: She is alert and oriented to person, place, and time.     Motor: No weakness.     Gait: Gait abnormal.  Psychiatric:        Mood and Affect: Mood normal.        Speech: Speech normal.        Behavior: Behavior normal.    Labs reviewed: Recent Labs    04/14/21  1610 04/17/21 0301 04/18/21 0354 04/23/21 0000 05/18/21 0000 06/19/21 0000 06/28/21 0000  NA 138 139 141   < > 143 136* 145  145  K 3.9 3.4* 3.8   < > 3.9 3.3* 4.0  4.0  CL 105 108 107   < > 103 99 101  101  CO2 '26 22 25   '$ < > 27* 29* 30*  30*  GLUCOSE 154* 97 83  --   --   --   --   BUN 19 24* 30*   < > 23* 23* 25*  25*  CREATININE 0.76 0.84 1.04*   < > 0.9 1.2* 1.0  1.0  CALCIUM 9.5 8.8* 9.1   < > 9.9 9.6 10.1  10.1  MG 2.2  --  2.5*  --   --   --  2.6  2.6   < > = values in this interval not displayed.   Recent Labs     12/21/20 0000 04/11/21 0950 06/19/21 0000  AST '24 30 24  '$ ALT 18 37 19  ALKPHOS 72 99 63  BILITOT  --  1.0  --   PROT  --  6.8  --   ALBUMIN 4.3 3.6 4.2   Recent Labs    04/11/21 0950 04/12/21 0426 04/13/21 0401 04/17/21 0301 04/23/21 0000 05/18/21 0000 06/19/21 0000  WBC 11.7* 13.4* 11.8* 11.5* 10.2 5.9 5.5  NEUTROABS 9.7* 10.7*  --   --   --   --   --   HGB 11.7* 11.5* 11.1* 12.5 13.2 11.6* 12.5  HCT 36.7 36.0 35.0* 38.9 42 37 38  MCV 98.4 98.4 99.2 97.5  --   --   --   PLT 271 308 306 407* 355 210 193   Lab Results  Component Value Date   TSH 0.56 05/18/2021   Lab Results  Component Value Date   HGBA1C 5.7 (H) 06/25/2017   Lab Results  Component Value Date   CHOL 138 11/04/2017   HDL 70 11/04/2017   LDLCALC 58 11/04/2017   TRIG 53 11/04/2017   CHOLHDL 3.1 06/25/2017    Significant Diagnostic Results in last 30 days:  No results found.  Assessment/Plan 1. Acute cough Afebrile Diminished breath sounds noted we will obtain checks x-ray to rule out other acute abnormalities - DG Chest 2 View; Future  2. Lower extremity edema Encouraged to wear compression stockings on in the morning and off at bedtime - Keep legs elevated when seated to keep swelling down  - check weight at least three times per week and notify provider for any abrupt weight gain > 3 lbs  - Reduce salt intake in diet   Family/ staff Communication: Reviewed plan of care with patient and daughter verbalized understanding  Labs/tests ordered: - DG Chest 2 View; Future  Next Appointment: As needed if symptoms worsen or fail to improve     Sandrea Hughs, NP

## 2021-11-29 DIAGNOSIS — R278 Other lack of coordination: Secondary | ICD-10-CM | POA: Diagnosis not present

## 2021-11-29 DIAGNOSIS — M6389 Disorders of muscle in diseases classified elsewhere, multiple sites: Secondary | ICD-10-CM | POA: Diagnosis not present

## 2021-11-29 DIAGNOSIS — R531 Weakness: Secondary | ICD-10-CM | POA: Diagnosis not present

## 2021-11-29 DIAGNOSIS — I959 Hypotension, unspecified: Secondary | ICD-10-CM | POA: Diagnosis not present

## 2021-11-29 DIAGNOSIS — E870 Hyperosmolality and hypernatremia: Secondary | ICD-10-CM | POA: Diagnosis not present

## 2021-11-29 DIAGNOSIS — M25511 Pain in right shoulder: Secondary | ICD-10-CM | POA: Diagnosis not present

## 2021-11-29 LAB — COMPREHENSIVE METABOLIC PANEL
Calcium: 9.2 (ref 8.7–10.7)
eGFR: 33

## 2021-11-29 LAB — BASIC METABOLIC PANEL
BUN: 35 — AB (ref 4–21)
CO2: 28 — AB (ref 13–22)
Chloride: 102 (ref 99–108)
Creatinine: 1.5 — AB (ref 0.5–1.1)
Glucose: 89
Potassium: 3.7 mEq/L (ref 3.5–5.1)
Sodium: 143 (ref 137–147)

## 2021-11-30 ENCOUNTER — Other Ambulatory Visit: Payer: Self-pay | Admitting: Internal Medicine

## 2021-11-30 ENCOUNTER — Encounter: Payer: Self-pay | Admitting: Adult Health

## 2021-11-30 ENCOUNTER — Telehealth: Payer: Self-pay | Admitting: Internal Medicine

## 2021-11-30 MED ORDER — TORSEMIDE 20 MG PO TABS: 30.0000 mg | ORAL_TABLET | Freq: Every day | ORAL | 3 refills | Status: DC

## 2021-11-30 NOTE — Telephone Encounter (Signed)
Patient Chest Xray was negative but she called the Nurse in [place] c/o SOB.  Says she feels better on Torsemide 30 mg Dose So will change it back to 30 mg If BP  runs low will consider decreasing Cozaar further

## 2021-12-04 DIAGNOSIS — M6389 Disorders of muscle in diseases classified elsewhere, multiple sites: Secondary | ICD-10-CM | POA: Diagnosis not present

## 2021-12-04 DIAGNOSIS — R278 Other lack of coordination: Secondary | ICD-10-CM | POA: Diagnosis not present

## 2021-12-04 DIAGNOSIS — R531 Weakness: Secondary | ICD-10-CM | POA: Diagnosis not present

## 2021-12-04 DIAGNOSIS — M25511 Pain in right shoulder: Secondary | ICD-10-CM | POA: Diagnosis not present

## 2021-12-06 ENCOUNTER — Ambulatory Visit (INDEPENDENT_AMBULATORY_CARE_PROVIDER_SITE_OTHER): Payer: Medicare Other

## 2021-12-06 DIAGNOSIS — M6389 Disorders of muscle in diseases classified elsewhere, multiple sites: Secondary | ICD-10-CM | POA: Diagnosis not present

## 2021-12-06 DIAGNOSIS — M25511 Pain in right shoulder: Secondary | ICD-10-CM | POA: Diagnosis not present

## 2021-12-06 DIAGNOSIS — I442 Atrioventricular block, complete: Secondary | ICD-10-CM | POA: Diagnosis not present

## 2021-12-06 DIAGNOSIS — R531 Weakness: Secondary | ICD-10-CM | POA: Diagnosis not present

## 2021-12-06 DIAGNOSIS — R278 Other lack of coordination: Secondary | ICD-10-CM | POA: Diagnosis not present

## 2021-12-11 DIAGNOSIS — R278 Other lack of coordination: Secondary | ICD-10-CM | POA: Diagnosis not present

## 2021-12-11 DIAGNOSIS — M6389 Disorders of muscle in diseases classified elsewhere, multiple sites: Secondary | ICD-10-CM | POA: Diagnosis not present

## 2021-12-11 DIAGNOSIS — R531 Weakness: Secondary | ICD-10-CM | POA: Diagnosis not present

## 2021-12-11 DIAGNOSIS — M25511 Pain in right shoulder: Secondary | ICD-10-CM | POA: Diagnosis not present

## 2021-12-11 LAB — CUP PACEART REMOTE DEVICE CHECK
Battery Impedance: 825 Ohm
Battery Remaining Longevity: 75 mo
Battery Voltage: 2.79 V
Brady Statistic RV Percent Paced: 96 %
Date Time Interrogation Session: 20230831090851
Implantable Lead Implant Date: 20090318
Implantable Lead Implant Date: 20090318
Implantable Lead Location: 753859
Implantable Lead Location: 753860
Implantable Lead Model: 5076
Implantable Lead Model: 5092
Implantable Pulse Generator Implant Date: 20160202
Lead Channel Impedance Value: 67 Ohm
Lead Channel Impedance Value: 761 Ohm
Lead Channel Pacing Threshold Amplitude: 0.875 V
Lead Channel Pacing Threshold Pulse Width: 0.4 ms
Lead Channel Setting Pacing Amplitude: 2.5 V
Lead Channel Setting Pacing Pulse Width: 0.4 ms
Lead Channel Setting Sensing Sensitivity: 4 mV

## 2021-12-12 ENCOUNTER — Other Ambulatory Visit: Payer: Self-pay | Admitting: *Deleted

## 2021-12-12 MED ORDER — CALCIUM 600-10 MG-MCG PO CHEW: 1.0000 | CHEWABLE_TABLET | Freq: Every day | ORAL | 5 refills | Status: DC

## 2021-12-12 NOTE — Telephone Encounter (Signed)
Nira Conn, Clinic Nurse with [facility] requested refill.

## 2021-12-13 DIAGNOSIS — R531 Weakness: Secondary | ICD-10-CM | POA: Diagnosis not present

## 2021-12-13 DIAGNOSIS — R278 Other lack of coordination: Secondary | ICD-10-CM | POA: Diagnosis not present

## 2021-12-13 DIAGNOSIS — M6389 Disorders of muscle in diseases classified elsewhere, multiple sites: Secondary | ICD-10-CM | POA: Diagnosis not present

## 2021-12-13 DIAGNOSIS — M25511 Pain in right shoulder: Secondary | ICD-10-CM | POA: Diagnosis not present

## 2021-12-18 DIAGNOSIS — R278 Other lack of coordination: Secondary | ICD-10-CM | POA: Diagnosis not present

## 2021-12-18 DIAGNOSIS — M6389 Disorders of muscle in diseases classified elsewhere, multiple sites: Secondary | ICD-10-CM | POA: Diagnosis not present

## 2021-12-18 DIAGNOSIS — R531 Weakness: Secondary | ICD-10-CM | POA: Diagnosis not present

## 2021-12-18 DIAGNOSIS — M25511 Pain in right shoulder: Secondary | ICD-10-CM | POA: Diagnosis not present

## 2021-12-20 DIAGNOSIS — M6389 Disorders of muscle in diseases classified elsewhere, multiple sites: Secondary | ICD-10-CM | POA: Diagnosis not present

## 2021-12-20 DIAGNOSIS — R531 Weakness: Secondary | ICD-10-CM | POA: Diagnosis not present

## 2021-12-20 DIAGNOSIS — M25511 Pain in right shoulder: Secondary | ICD-10-CM | POA: Diagnosis not present

## 2021-12-20 DIAGNOSIS — R278 Other lack of coordination: Secondary | ICD-10-CM | POA: Diagnosis not present

## 2021-12-20 DIAGNOSIS — I5020 Unspecified systolic (congestive) heart failure: Secondary | ICD-10-CM | POA: Diagnosis not present

## 2021-12-20 LAB — BASIC METABOLIC PANEL
BUN: 46 — AB (ref 4–21)
CO2: 28 — AB (ref 13–22)
Chloride: 103 (ref 99–108)
Creatinine: 1.5 — AB (ref 0.5–1.1)
Glucose: 99
Potassium: 3.7 mEq/L (ref 3.5–5.1)
Sodium: 143 (ref 137–147)

## 2021-12-20 LAB — COMPREHENSIVE METABOLIC PANEL
Calcium: 9.7 (ref 8.7–10.7)
eGFR: 33

## 2021-12-21 ENCOUNTER — Encounter: Payer: Self-pay | Admitting: Internal Medicine

## 2021-12-25 DIAGNOSIS — M25511 Pain in right shoulder: Secondary | ICD-10-CM | POA: Diagnosis not present

## 2021-12-25 DIAGNOSIS — M6389 Disorders of muscle in diseases classified elsewhere, multiple sites: Secondary | ICD-10-CM | POA: Diagnosis not present

## 2021-12-25 DIAGNOSIS — R278 Other lack of coordination: Secondary | ICD-10-CM | POA: Diagnosis not present

## 2021-12-25 DIAGNOSIS — R531 Weakness: Secondary | ICD-10-CM | POA: Diagnosis not present

## 2021-12-27 NOTE — Progress Notes (Signed)
Remote pacemaker transmission.   

## 2021-12-28 DIAGNOSIS — R278 Other lack of coordination: Secondary | ICD-10-CM | POA: Diagnosis not present

## 2021-12-28 DIAGNOSIS — R531 Weakness: Secondary | ICD-10-CM | POA: Diagnosis not present

## 2021-12-28 DIAGNOSIS — M6389 Disorders of muscle in diseases classified elsewhere, multiple sites: Secondary | ICD-10-CM | POA: Diagnosis not present

## 2021-12-28 DIAGNOSIS — M25511 Pain in right shoulder: Secondary | ICD-10-CM | POA: Diagnosis not present

## 2022-01-01 DIAGNOSIS — R531 Weakness: Secondary | ICD-10-CM | POA: Diagnosis not present

## 2022-01-01 DIAGNOSIS — M6389 Disorders of muscle in diseases classified elsewhere, multiple sites: Secondary | ICD-10-CM | POA: Diagnosis not present

## 2022-01-01 DIAGNOSIS — M25511 Pain in right shoulder: Secondary | ICD-10-CM | POA: Diagnosis not present

## 2022-01-01 DIAGNOSIS — R278 Other lack of coordination: Secondary | ICD-10-CM | POA: Diagnosis not present

## 2022-01-02 ENCOUNTER — Other Ambulatory Visit: Payer: Self-pay | Admitting: *Deleted

## 2022-01-02 MED ORDER — RIVAROXABAN 15 MG PO TABS: ORAL_TABLET | ORAL | 11 refills | Status: DC

## 2022-01-02 NOTE — Telephone Encounter (Signed)
Patriciaann Clan Clinic Nurse, Requested refill.

## 2022-01-04 DIAGNOSIS — R531 Weakness: Secondary | ICD-10-CM | POA: Diagnosis not present

## 2022-01-04 DIAGNOSIS — M25511 Pain in right shoulder: Secondary | ICD-10-CM | POA: Diagnosis not present

## 2022-01-04 DIAGNOSIS — M6389 Disorders of muscle in diseases classified elsewhere, multiple sites: Secondary | ICD-10-CM | POA: Diagnosis not present

## 2022-01-04 DIAGNOSIS — R278 Other lack of coordination: Secondary | ICD-10-CM | POA: Diagnosis not present

## 2022-01-05 NOTE — Progress Notes (Unsigned)
Subjective:    Patient ID: Jody Blake, female    DOB: 1925/11/01, 88 y.o.   MRN: [id number]  HPI F never smoker, followed for allergic rhinitis, chronic bronchitis, complicated by GERD, Hx PAfib/ pacemaker. ------------------------------------------------------------------------------------------------  07/09/21- 88 year old female never smoker followed for Allergic rhinitis, COPD mixed type, complicated by GERD,  AFib/pacemaker/ Xarelto, Glaucoma/ macular degen, dCHF, CHB/ pacemaker, AoStenosis, CKDIII, TIA      Living at [facility] -Singulair, Ventolin hfa, Symbicort 160, Neb Xopenex,  flonase, astelin, prednisone 5 mg daily, Pepcid,Gabapentin 100 qhs, Neb Xopenex,  Covid vax- 4 Moderna                    Caregiver here with her today Flu vax-had NP Parrett 2/17 for bronchitis after hosp pneumonia/ CHF in Jan. Managed at [facility] for dCHF.> adjusting diuretic. Still postnasal drip. Occasional reflux. Sleeps with HOB up.  CXR 05/25/21- IMPRESSION: 1. Diffuse, chronic appearing increased interstitial lung markings. 2. Small left pleural effusion.  01/07/22- 88 year old female never smoker followed for Allergic rhinitis, COPD mixed type, complicated by GERD,  AFib/pacemaker/ Xarelto, Glaucoma/ macular degen, dCHF, CHB/ pacemaker, AoStenosis, CKDIII, TIA      Living at [facility] -Singulair, Ventolin hfa, Symbicort 160, Neb Xopenex,  flonase, astelin, prednisone 5 mg daily, Pepcid,Gabapentin 100 qhs, Neb Xopenex,  Covid vax- 4 Moderna                    Caregiver here with her today Flu vax-today senior RSV vax- today Reports breathing is stable with no increase in DOE or cough. Asks about possibly trying different antihistamine for rhinitis/ drip. Discussed vaccines. CXR 11/30/21-  IMPRESSION: 1. No active cardiopulmonary disease. No evidence of pneumonia or pulmonary edema. 2. Stable cardiomegaly. 3. Aortic atherosclerosis.  ROS-see HPI   + = positive Constitutional:    weight  loss, night sweats, fevers, chills, fatigue, lassitude. HEENT:    headaches, difficulty swallowing, tooth/dental problems, sore throat,       sneezing, itching, ear ache, nasal congestion, +post nasal drip, snoring CV:    chest pain, orthopnea, PND, swelling in lower extremities, anasarca,                                                    dizziness, palpitations Resp:   shortness of breath with exertion or at rest.                 productive cough,   +non-productive cough, coughing up of blood.              change in color of mucus.  +wheezing.   Skin:    rash or lesions. GI: +  heartburn, indigestion, abdominal pain, nausea, vomiting,  GU:  MS:   joint pain, stiffness,  Neuro-     nothing unusual Psych:  change in mood or affect.  depression or anxiety.   memory loss.  Objective:  OBJ- Physical Exam General- Alert, Oriented, Affect-appropriate, Distress- none acute, + slender Skin- rash-none, lesions- none, excoriation- none Lymphadenopathy- none Head- atraumatic            Eyes- Gross vision intact, PERRLA, conjunctivae and secretions clear            Ears- + Hard of hearing            Nose- Clear, no-Septal  dev, mucus, polyps, erosion, perforation             Throat- Mallampati II , mucosa clear , drainage- none, tonsils- atrophic Neck- flexible , trachea midline, no stridor , thyroid nl, carotid no bruit Chest - symmetrical excursion , unlabored           Heart/CV- RRR , no murmur , no gallop  , no rub, nl s1 s2                           - JVD- none , edema+ elastic hose, stasis changes- none, varices- none           Lung-  +few rhonchi, Wheeze -nonel, cough- , dullness -none, rub- none           Chest wall-+ left pacemaker.  Abd-   Br/ Gen/ Rectal- Not done, not indicated Extrem- +rolling walker Neuro- grossly intact to observation    Assessment & Plan:

## 2022-01-07 ENCOUNTER — Ambulatory Visit (INDEPENDENT_AMBULATORY_CARE_PROVIDER_SITE_OTHER): Payer: Medicare Other | Admitting: Internal Medicine

## 2022-01-07 ENCOUNTER — Encounter: Payer: Self-pay | Admitting: Internal Medicine

## 2022-01-07 VITALS — BP 114/68 | HR 79 | Temp 97.6°F | Ht 63.0 in | Wt 131.8 lb

## 2022-01-07 DIAGNOSIS — J449 Chronic obstructive pulmonary disease, unspecified: Secondary | ICD-10-CM | POA: Diagnosis not present

## 2022-01-07 DIAGNOSIS — Z23 Encounter for immunization: Secondary | ICD-10-CM | POA: Diagnosis not present

## 2022-01-07 DIAGNOSIS — J302 Other seasonal allergic rhinitis: Secondary | ICD-10-CM | POA: Diagnosis not present

## 2022-01-07 DIAGNOSIS — Z2911 Encounter for prophylactic immunotherapy for respiratory syncytial virus (RSV): Secondary | ICD-10-CM

## 2022-01-07 DIAGNOSIS — J3089 Other allergic rhinitis: Secondary | ICD-10-CM | POA: Diagnosis not present

## 2022-01-07 NOTE — Patient Instructions (Addendum)
Order- flu vax senior  [place] RSV vaccine  Ok to try otc allegra or zyrtec instead of claritin as an antihistamine, to see if one works better for you.

## 2022-01-08 ENCOUNTER — Encounter: Payer: Self-pay | Admitting: Internal Medicine

## 2022-01-08 ENCOUNTER — Ambulatory Visit: Payer: Medicare Other | Admitting: Internal Medicine

## 2022-01-08 DIAGNOSIS — R278 Other lack of coordination: Secondary | ICD-10-CM | POA: Diagnosis not present

## 2022-01-08 DIAGNOSIS — M25511 Pain in right shoulder: Secondary | ICD-10-CM | POA: Diagnosis not present

## 2022-01-08 DIAGNOSIS — R531 Weakness: Secondary | ICD-10-CM | POA: Diagnosis not present

## 2022-01-08 DIAGNOSIS — M6389 Disorders of muscle in diseases classified elsewhere, multiple sites: Secondary | ICD-10-CM | POA: Diagnosis not present

## 2022-01-08 NOTE — Assessment & Plan Note (Signed)
Minimal increased drainage. Allergic and/ or vasomotor. Plan- ok to try changing claritin to allegra or zyrtec

## 2022-01-08 NOTE — Assessment & Plan Note (Addendum)
Stable with no concerns. Discussed symptom overlap with cardiac disease. Plan- ok Mucinex ok if needed. Senior flu vax. RSV vax

## 2022-01-10 ENCOUNTER — Telehealth: Payer: Self-pay | Admitting: Adult Health

## 2022-01-10 DIAGNOSIS — M25511 Pain in right shoulder: Secondary | ICD-10-CM | POA: Diagnosis not present

## 2022-01-10 DIAGNOSIS — R531 Weakness: Secondary | ICD-10-CM | POA: Diagnosis not present

## 2022-01-10 DIAGNOSIS — M6389 Disorders of muscle in diseases classified elsewhere, multiple sites: Secondary | ICD-10-CM | POA: Diagnosis not present

## 2022-01-10 DIAGNOSIS — L03116 Cellulitis of left lower limb: Secondary | ICD-10-CM

## 2022-01-10 DIAGNOSIS — R278 Other lack of coordination: Secondary | ICD-10-CM | POA: Diagnosis not present

## 2022-01-10 MED ORDER — DOXYCYCLINE HYCLATE 100 MG PO TABS: 100.0000 mg | ORAL_TABLET | Freq: Two times a day (BID) | ORAL | 0 refills | Status: DC

## 2022-01-10 NOTE — Telephone Encounter (Signed)
Nurse reports left lower ext pain and redness below a bruise. Warmth and tenderness also noted. No purulent matter or fever. Will prescribed doxy

## 2022-01-14 DIAGNOSIS — M25511 Pain in right shoulder: Secondary | ICD-10-CM | POA: Diagnosis not present

## 2022-01-14 DIAGNOSIS — M6389 Disorders of muscle in diseases classified elsewhere, multiple sites: Secondary | ICD-10-CM | POA: Diagnosis not present

## 2022-01-14 DIAGNOSIS — R531 Weakness: Secondary | ICD-10-CM | POA: Diagnosis not present

## 2022-01-14 DIAGNOSIS — R278 Other lack of coordination: Secondary | ICD-10-CM | POA: Diagnosis not present

## 2022-01-15 ENCOUNTER — Telehealth: Payer: Self-pay | Admitting: Internal Medicine

## 2022-01-15 DIAGNOSIS — H401131 Primary open-angle glaucoma, bilateral, mild stage: Secondary | ICD-10-CM | POA: Diagnosis not present

## 2022-01-15 DIAGNOSIS — H35371 Puckering of macula, right eye: Secondary | ICD-10-CM | POA: Diagnosis not present

## 2022-01-15 DIAGNOSIS — H353122 Nonexudative age-related macular degeneration, left eye, intermediate dry stage: Secondary | ICD-10-CM | POA: Diagnosis not present

## 2022-01-15 DIAGNOSIS — H353114 Nonexudative age-related macular degeneration, right eye, advanced atrophic with subfoveal involvement: Secondary | ICD-10-CM | POA: Diagnosis not present

## 2022-01-15 DIAGNOSIS — Z961 Presence of intraocular lens: Secondary | ICD-10-CM | POA: Diagnosis not present

## 2022-01-15 DIAGNOSIS — D3132 Benign neoplasm of left choroid: Secondary | ICD-10-CM | POA: Diagnosis not present

## 2022-01-15 NOTE — Telephone Encounter (Signed)
Daughter called concerned that Patient is going to the beach over the weekend  and her Doxycyline for cellulitis ends on Fri morning. She is worried what if her infection comes back. I looked at the [facility] of her leg and it has improved dramatically on Doxycyline and I don't think she needs to continue Antibiotics beyond this Fri If daughter notices any change can call [place] on call over the weekend.

## 2022-01-18 ENCOUNTER — Telehealth: Payer: Self-pay | Admitting: Internal Medicine

## 2022-01-18 MED ORDER — DOXYCYCLINE HYCLATE 100 MG PO TABS: 100.0000 mg | ORAL_TABLET | Freq: Two times a day (BID) | ORAL | 0 refills | Status: DC

## 2022-01-18 NOTE — Telephone Encounter (Signed)
Patients daughter called saying that they are at the beach and Jody Blake Leg is still red and now draining and tender Pic was send to the Facility nurse Will continue Doxycyline for 7 more days. She has to come for follow up next week

## 2022-01-22 DIAGNOSIS — M6389 Disorders of muscle in diseases classified elsewhere, multiple sites: Secondary | ICD-10-CM | POA: Diagnosis not present

## 2022-01-22 DIAGNOSIS — R531 Weakness: Secondary | ICD-10-CM | POA: Diagnosis not present

## 2022-01-22 DIAGNOSIS — M25511 Pain in right shoulder: Secondary | ICD-10-CM | POA: Diagnosis not present

## 2022-01-22 DIAGNOSIS — R278 Other lack of coordination: Secondary | ICD-10-CM | POA: Diagnosis not present

## 2022-01-25 DIAGNOSIS — M25511 Pain in right shoulder: Secondary | ICD-10-CM | POA: Diagnosis not present

## 2022-01-25 DIAGNOSIS — R531 Weakness: Secondary | ICD-10-CM | POA: Diagnosis not present

## 2022-01-25 DIAGNOSIS — M6389 Disorders of muscle in diseases classified elsewhere, multiple sites: Secondary | ICD-10-CM | POA: Diagnosis not present

## 2022-01-25 DIAGNOSIS — R278 Other lack of coordination: Secondary | ICD-10-CM | POA: Diagnosis not present

## 2022-01-28 ENCOUNTER — Other Ambulatory Visit (HOSPITAL_BASED_OUTPATIENT_CLINIC_OR_DEPARTMENT_OTHER): Payer: Self-pay

## 2022-01-28 DIAGNOSIS — R278 Other lack of coordination: Secondary | ICD-10-CM | POA: Diagnosis not present

## 2022-01-28 DIAGNOSIS — M6389 Disorders of muscle in diseases classified elsewhere, multiple sites: Secondary | ICD-10-CM | POA: Diagnosis not present

## 2022-01-28 DIAGNOSIS — R531 Weakness: Secondary | ICD-10-CM | POA: Diagnosis not present

## 2022-01-28 DIAGNOSIS — Z23 Encounter for immunization: Secondary | ICD-10-CM | POA: Diagnosis not present

## 2022-01-28 DIAGNOSIS — M25511 Pain in right shoulder: Secondary | ICD-10-CM | POA: Diagnosis not present

## 2022-01-28 MED ORDER — COMIRNATY 30 MCG/0.3ML IM SUSY
PREFILLED_SYRINGE | INTRAMUSCULAR | 0 refills | Status: DC
  Filled 2022-01-28: qty 0.3, 1d supply, fill #0

## 2022-02-01 DIAGNOSIS — M6389 Disorders of muscle in diseases classified elsewhere, multiple sites: Secondary | ICD-10-CM | POA: Diagnosis not present

## 2022-02-01 DIAGNOSIS — M25511 Pain in right shoulder: Secondary | ICD-10-CM | POA: Diagnosis not present

## 2022-02-01 DIAGNOSIS — R278 Other lack of coordination: Secondary | ICD-10-CM | POA: Diagnosis not present

## 2022-02-01 DIAGNOSIS — R531 Weakness: Secondary | ICD-10-CM | POA: Diagnosis not present

## 2022-02-04 DIAGNOSIS — R531 Weakness: Secondary | ICD-10-CM | POA: Diagnosis not present

## 2022-02-04 DIAGNOSIS — M25511 Pain in right shoulder: Secondary | ICD-10-CM | POA: Diagnosis not present

## 2022-02-04 DIAGNOSIS — M6389 Disorders of muscle in diseases classified elsewhere, multiple sites: Secondary | ICD-10-CM | POA: Diagnosis not present

## 2022-02-04 DIAGNOSIS — R278 Other lack of coordination: Secondary | ICD-10-CM | POA: Diagnosis not present

## 2022-02-06 DIAGNOSIS — R278 Other lack of coordination: Secondary | ICD-10-CM | POA: Diagnosis not present

## 2022-02-06 DIAGNOSIS — R531 Weakness: Secondary | ICD-10-CM | POA: Diagnosis not present

## 2022-02-06 DIAGNOSIS — M6389 Disorders of muscle in diseases classified elsewhere, multiple sites: Secondary | ICD-10-CM | POA: Diagnosis not present

## 2022-02-06 DIAGNOSIS — M25511 Pain in right shoulder: Secondary | ICD-10-CM | POA: Diagnosis not present

## 2022-02-11 DIAGNOSIS — R278 Other lack of coordination: Secondary | ICD-10-CM | POA: Diagnosis not present

## 2022-02-11 DIAGNOSIS — R531 Weakness: Secondary | ICD-10-CM | POA: Diagnosis not present

## 2022-02-11 DIAGNOSIS — M25511 Pain in right shoulder: Secondary | ICD-10-CM | POA: Diagnosis not present

## 2022-02-11 DIAGNOSIS — M6389 Disorders of muscle in diseases classified elsewhere, multiple sites: Secondary | ICD-10-CM | POA: Diagnosis not present

## 2022-02-12 ENCOUNTER — Non-Acute Institutional Stay: Payer: Medicare Other | Admitting: Internal Medicine

## 2022-02-12 ENCOUNTER — Encounter: Payer: Self-pay | Admitting: Internal Medicine

## 2022-02-12 VITALS — BP 122/74 | HR 77 | Temp 97.6°F | Resp 18 | Ht 63.0 in | Wt 133.4 lb

## 2022-02-12 DIAGNOSIS — L03116 Cellulitis of left lower limb: Secondary | ICD-10-CM | POA: Diagnosis not present

## 2022-02-12 DIAGNOSIS — R600 Localized edema: Secondary | ICD-10-CM | POA: Diagnosis not present

## 2022-02-12 DIAGNOSIS — J449 Chronic obstructive pulmonary disease, unspecified: Secondary | ICD-10-CM

## 2022-02-12 DIAGNOSIS — I5032 Chronic diastolic (congestive) heart failure: Secondary | ICD-10-CM

## 2022-02-12 DIAGNOSIS — M85852 Other specified disorders of bone density and structure, left thigh: Secondary | ICD-10-CM

## 2022-02-12 DIAGNOSIS — I10 Essential (primary) hypertension: Secondary | ICD-10-CM

## 2022-02-12 DIAGNOSIS — N1832 Chronic kidney disease, stage 3b: Secondary | ICD-10-CM | POA: Diagnosis not present

## 2022-02-12 DIAGNOSIS — E039 Hypothyroidism, unspecified: Secondary | ICD-10-CM | POA: Diagnosis not present

## 2022-02-12 DIAGNOSIS — M85851 Other specified disorders of bone density and structure, right thigh: Secondary | ICD-10-CM

## 2022-02-12 DIAGNOSIS — I495 Sick sinus syndrome: Secondary | ICD-10-CM | POA: Diagnosis not present

## 2022-02-12 NOTE — Progress Notes (Signed)
Location:  [place] of Service:  Clinic (12)  Provider:   Code Status: DNR Goals of Care:     02/12/2022   10:26 AM  Advanced Directives  Does Patient Have a Medical Advance Directive? Yes  Type of Paramedic of [place];Living will;Out of facility DNR (pink MOST or yellow form)  Does patient want to make changes to medical advance directive? No - Patient declined  Copy of [place] in Chart? Yes - validated most recent copy scanned in chart (See row information)     Chief Complaint  Patient presents with   Medical Management of Chronic Issues    Follow up on her leg for cellulitis and discuss albuterol.   Immunizations    Discussed the need for covid vaccine   Quality Metric Gaps    Discussed the need for AWV last one was 02/16/2022    HPI: Patient is a 88 y.o. female seen today for medical management of chronic diseases.    Lives in [place] in [place]  Patient has h/o A. fib on Xarelto s/p AV nodal ablation, s/p PPM in 2009 Aortic stenosis  COPD with frequent exacerbations.  Not on oxygen.  On chronic prednisone.  Follows closely with Dr. [name].  Is enrolled in palliative care Chronic diastolic CHF  History of chronic venous insufficiency Vision loss due to macular degeneration and glaucoma Very HOH Osteopenia  Recent Cellulitis in her Left Leg Now resolved Does have a area where there was skin tear which is dry with no redness   Continues to do well with help of her caregiver who is with her 5 days a week Daughter also helps Also on Med management Using Albuterol Nebs 3/ day Walks with her walker but get tired easily LE swelling maintained with torsemide Wt Readings from Last 3 Encounters:  02/12/22 133 lb 6.4 oz (60.5 kg)  01/07/22 131 lb 12.8 oz (59.8 kg)  11/28/21 134 lb (60.8 kg)     Past Medical History:  Diagnosis Date   Anemia    Aortic stenosis    a. Echo 09/06/12 EF 55-60%, no  WMA, G2DD, Ao valve sclerosis w/ mod stenosis, LA mildly dilated, PA pressure 84mHg   Asthmatic bronchitis    Complete heart block ([facility]    s/p permanent pacemaker 06/27/1999 (Battery change 06/2007 and 2016).  s/p AV nodal ablation by Dr ARayann Heman2016.   COPD (chronic obstructive pulmonary disease) (HCC)    Dr. YAnnamaria Boots  DDD (degenerative disc disease)    Diastolic dysfunction    a. Echo 09/06/12 EF 55-60%, no WMA, G2DD, Ao valve sclerosis w/ mod stenosis, LA mildly dilated, PA pressure 423mg   Diverticulosis    DVT (deep vein thrombosis) in pregnancy 1954   a. LLE   Hypertension    Hypothyroidism    on medication   Macular degeneration    Dr. KuEliezer Bottom Osteoarthrosis, unspecified whether generalized or localized, other specified sites    PAF (paroxysmal atrial fibrillation) ([facility]   Stopped flecainide, on amiodarone but still has bouts of A FIB (mostly in mornings).    Pure hypercholesterolemia    Staghorn calculus    Left    Past Surgical History:  Procedure Laterality Date   APPENDECTOMY  ~ 1941   AV NODE ABLATION  05/10/2014   AV NODE ABLATION N/A 05/10/2014   Procedure: AV NODE ABLATION;  Surgeon: JaThompson GrayerMD;  [facility];  Service:  Cardiovascular;  Laterality: N/A;   BUNIONECTOMY WITH HAMMERTOE RECONSTRUCTION Bilateral ~ [place]  06/27/99   Medtronic PM implanted by Dr [name]   CARDIOVERSION N/A 12/02/2013   Procedure: CARDIOVERSION;  Surgeon: [name], MD;  Location: [facility];  Service: Cardiovascular;  Laterality: N/A;   CATARACT EXTRACTION W/ INTRAOCULAR LENS  IMPLANT, BILATERAL Bilateral    COLONOSCOPY WITH PROPOFOL N/A 04/22/2013   Procedure: COLONOSCOPY WITH PROPOFOL;  Surgeon: [name], MD;  Location: [facility] ENDOSCOPY;  Service: Endoscopy;  Laterality: N/A;   DILATION AND CURETTAGE OF UTERUS  X 2   "when I was going thru menopause"   ESOPHAGOGASTRODUODENOSCOPY (EGD) WITH PROPOFOL N/A 04/22/2013   Procedure:  ESOPHAGOGASTRODUODENOSCOPY (EGD) WITH PROPOFOL;  Surgeon: [name], MD;  Location: [facility] ENDOSCOPY;  Service: Endoscopy;  Laterality: N/A;   HERNIA REPAIR     INCISIONAL HERNIA REPAIR     INSERT / REPLACE / REMOVE PACEMAKER  06/2007   "took out the old; put in new"   INSERT / REPLACE / REMOVE PACEMAKER  05/10/2014   MDT PPM generator change by Dr [name]   JOINT REPLACEMENT     PARTIAL NEPHRECTOMY Left 05/1974   stone disease   PERMANENT PACEMAKER GENERATOR CHANGE N/A 05/10/2014   Procedure: PERMANENT PACEMAKER GENERATOR CHANGE;  Surgeon: Thompson Grayer, MD;  Location: [facility] CATH LAB;  Service: Cardiovascular;  Laterality: N/A;   TONSILLECTOMY AND ADENOIDECTOMY  1930's   TOTAL KNEE ARTHROPLASTY Right 2001    Allergies  Allergen Reactions   Brimonidine Tartrate-Timolol Other (See Comments)    REACTION: systemic malaise amigen eye drop   Penicillins Hives   Breo Ellipta [Fluticasone Furoate-Vilanterol] Other (See Comments)    Ran blood pressure up Increased blood pressure   Vancomycin Other (See Comments)    Red man syndrome - Mild redness and discomfort. Able to complete initial dose.     Outpatient Encounter Medications as of 02/12/2022  Medication Sig   albuterol (VENTOLIN HFA) 108 (90 Base) MCG/ACT inhaler Inhale 2 puffs into the lungs every 6 (six) hours as needed for wheezing or shortness of breath.   Azelastine HCl 137 MCG/SPRAY SOLN USE 1 TO 2 SPRAYS INTO BOTH NOSTRILS TWICE DAILY.   budesonide-formoterol (SYMBICORT) 160-4.5 MCG/ACT inhaler 2 puffs 2 (two) times daily.   Calcium 600-10 MG-MCG CHEW Chew 1 capsule by mouth daily.   Cholecalciferol (VITAMIN D) 125 MCG (5000 UT) CAPS Take 1 capsule by mouth daily.   COVID-19 mRNA vaccine 2023-2024 (COMIRNATY) syringe Inject into the muscle.   dorzolamide (TRUSOPT) 2 % ophthalmic solution 1 drop 2 (two) times daily.   doxycycline (VIBRA-TABS) 100 MG tablet Take 1 tablet (100 mg total) by mouth 2 (two) times daily.   famotidine (PEPCID)  40 MG tablet Take 1 tablet (40 mg total) by mouth at bedtime.   fluticasone (FLONASE) 50 MCG/ACT nasal spray USE 2 SPRAYS EACH NOSTRIL ONCE A DAY AS NEEDED FOR ALLERGIES OR CONGESTION.   gabapentin (NEURONTIN) 100 MG capsule Take 1 capsule (100 mg total) by mouth at bedtime.   guaiFENesin (MUCINEX) 600 MG 12 hr tablet Take 1 tablet (600 mg total) by mouth 2 (two) times daily. (Patient taking differently: Take 600 mg by mouth 2 (two) times daily as needed.)   latanoprost (XALATAN) 0.005 % ophthalmic solution Place 1 drop into both eyes at bedtime.   levothyroxine (SYNTHROID) 75 MCG tablet Take 1 tablet (75 mcg total) by mouth daily.   loratadine (CLARITIN) 10 MG tablet Take  1 tablet (10 mg total) by mouth daily.   losartan (COZAAR) 50 MG tablet Take 1 tablet (50 mg total) by mouth daily.   Magnesium 250 MG TABS Take 1 tablet by mouth daily.   metoprolol tartrate (LOPRESSOR) 50 MG tablet Take 1.5 tablets (75 mg total) by mouth in the morning.   montelukast (SINGULAIR) 10 MG tablet Take 1 tablet (10 mg total) by mouth daily.   Multiple Vitamins-Minerals (PRESERVISION AREDS 2) CAPS Take 1 capsule by mouth in the morning and at bedtime.   potassium chloride (KLOR-CON) 10 MEQ tablet Take 2 tablets (20 mEq total) by mouth daily.   predniSONE (DELTASONE) 5 MG tablet Take 1 tablet (5 mg total) by mouth daily with breakfast.   Rivaroxaban (XARELTO) 15 MG TABS tablet TAKE 1 TABLET BY MOUTH  DAILY WITH SUPPER   sennosides-docusate sodium (SENOKOT-S) 8.6-50 MG tablet Take 2 tablets by mouth at bedtime.   torsemide (DEMADEX) 20 MG tablet Take 1 tablet (20 mg total) by mouth daily as needed. 3 lb weight gain in 1 day or 5 lb in 1 week or excessive edema   torsemide (DEMADEX) 20 MG tablet Take 1.5 tablets (30 mg total) by mouth daily.   [DISCONTINUED] fexofenadine (ALLEGRA) 180 MG tablet Take 1 tablet (180 mg total) by mouth daily.   [DISCONTINUED] ipratropium-albuterol (DUONEB) 0.5-2.5 (3) MG/3ML SOLN Take 3 mLs  by nebulization 3 (three) times daily as needed.   Lutein 20 MG CAPS Take 1 capsule (20 mg total) by mouth daily.   [DISCONTINUED] levalbuterol (XOPENEX) 0.63 MG/3ML nebulizer solution Take 3 mLs (0.63 mg total) by nebulization 2 (two) times daily as needed for wheezing or shortness of breath. (Patient not taking: Reported on 01/07/2022)   [DISCONTINUED] levalbuterol (XOPENEX) 0.63 MG/3ML nebulizer solution Take 3 mLs (0.63 mg total) by nebulization in the morning and at bedtime. (Patient not taking: Reported on 01/07/2022)   [DISCONTINUED] mometasone-formoterol (DULERA) 200-5 MCG/ACT AERO Inhale 2 puffs into the lungs in the morning and at bedtime.   No facility-administered encounter medications on file as of 02/12/2022.    Review of Systems:  Review of Systems  Constitutional:  Negative for activity change and appetite change.  HENT: Negative.    Respiratory:  Positive for cough and shortness of breath.   Cardiovascular:  Positive for leg swelling.  Gastrointestinal:  Negative for constipation.  Genitourinary: Negative.   Musculoskeletal:  Positive for gait problem. Negative for arthralgias and myalgias.  Skin:  Positive for wound.  Neurological:  Negative for dizziness and weakness.  Psychiatric/Behavioral:  Negative for confusion, dysphoric mood and sleep disturbance.     Health Maintenance  Topic Date Due   COVID-19 Vaccine (5 - Moderna series) 12/10/2021   Medicare Annual Wellness (AWV)  02/16/2022   TETANUS/TDAP  02/23/2029   Pneumonia Vaccine 107+ Years old  Completed   INFLUENZA VACCINE  Completed   Zoster Vaccines- Shingrix  Completed   HPV VACCINES  Aged Out   DEXA SCAN  Discontinued    Physical Exam: Vitals:   02/12/22 1018  BP: 122/74  Pulse: 77  Resp: 18  Temp: 97.6 F (36.4 C)  TempSrc: Temporal  SpO2: 99%  Weight: 133 lb 6.4 oz (60.5 kg)  Height: '5\' 3"'$  (1.6 m)   Body mass index is 23.63 kg/m. Physical Exam Vitals reviewed.  Constitutional:       Appearance: Normal appearance.  HENT:     Head: Normocephalic.     Nose: Nose normal.     Mouth/Throat:  Mouth: Mucous membranes are moist.     Pharynx: Oropharynx is clear.  Eyes:     Pupils: Pupils are equal, round, and reactive to light.  Cardiovascular:     Rate and Rhythm: Normal rate and regular rhythm.     Pulses: Normal pulses.     Heart sounds: Normal heart sounds. No murmur heard. Pulmonary:     Effort: Pulmonary effort is normal.     Breath sounds: Normal breath sounds.  Abdominal:     General: Abdomen is flat. Bowel sounds are normal.     Palpations: Abdomen is soft.  Musculoskeletal:        General: No swelling.     Cervical back: Neck supple.  Skin:    General: Skin is warm.     Comments: Small almost healed are in her Left Leg. No Signs of infection Also has skin tear in her Left hand  Neurological:     General: No focal deficit present.     Mental Status: She is alert and oriented to person, place, and time.  Psychiatric:        Mood and Affect: Mood normal.        Thought Content: Thought content normal.     Labs reviewed: Basic Metabolic Panel: Recent Labs    04/12/21 0426 04/13/21 0401 04/14/21 0357 04/17/21 0301 04/18/21 0354 04/23/21 0000 05/18/21 0000 06/19/21 0000 06/28/21 0000 11/15/21 1531 11/29/21 0000 12/20/21 0000  NA 137   < > 138 139 141   < > 143   < > 145  145 146 143 143  K 3.0*   < > 3.9 3.4* 3.8   < > 3.9   < > 4.0  4.0 3.8 3.7 3.7  CL 99   < > 105 108 107   < > 103   < > 101  101 98* 102 103  CO2 26   < > '26 22 25   '$ < > 27*   < > 30*  30* 28* 28* 28*  GLUCOSE 107*   < > 154* 97 83  --   --   --   --   --   --   --   BUN 16   < > 19 24* 30*   < > 23*   < > 25*  25* 49* 35* 46*  CREATININE 1.03*   < > 0.76 0.84 1.04*   < > 0.9   < > 1.0  1.0 1.5* 1.5* 1.5*  CALCIUM 8.9   < > 9.5 8.8* 9.1   < > 9.9   < > 10.1  10.1 10.1 9.2 9.7  MG 2.1  --  2.2  --  2.5*  --   --   --  2.6  2.6  --   --   --   TSH 0.317*  --   --    --   --   --  0.56  --   --  2.57  --   --    < > = values in this interval not displayed.   Liver Function Tests: Recent Labs    04/11/21 0950 06/19/21 0000 11/15/21 1531  AST 30 24 35  ALT 37 19 41*  ALKPHOS 99 63 60  BILITOT 1.0  --   --   PROT 6.8  --   --   ALBUMIN 3.6 4.2 4.4   No results for input(s): "LIPASE", "AMYLASE" in the last 8760 hours. No results for input(s): "  AMMONIA" in the last 8760 hours. CBC: Recent Labs    04/11/21 0950 04/12/21 0426 04/13/21 0401 04/17/21 0301 04/23/21 0000 05/18/21 0000 06/19/21 0000 11/15/21 1531  WBC 11.7* 13.4* 11.8* 11.5*   < > 5.9 5.5 6.9  NEUTROABS 9.7* 10.7*  --   --   --   --   --   --   HGB 11.7* 11.5* 11.1* 12.5   < > 11.6* 12.5 13.1  HCT 36.7 36.0 35.0* 38.9   < > 37 38 39  MCV 98.4 98.4 99.2 97.5  --   --   --   --   PLT 271 308 306 407*   < > 210 193 249   < > = values in this interval not displayed.   Lipid Panel: No results for input(s): "CHOL", "HDL", "LDLCALC", "TRIG", "CHOLHDL", "LDLDIRECT" in the last 8760 hours. Lab Results  Component Value Date   HGBA1C 5.7 (H) 06/25/2017    Procedures since last visit: No results found.  Assessment/Plan 1. Cellulitis of left lower extremity Mostly resolved  2. Lower extremity edema On Torsemide stable  3. Permanent atrial fibrillation (HCC) On Xarelto and Lopressor 4. Acquired hypothyroidism TSH normal in 08/23  5. COPD mixed type (HCC) On Nebs and Chronic Prednisone per Pulmonary Low endurance Got RSV per Dr [name] 6. Stage 3b chronic kidney disease (HCC) Creat stable  7. Sick sinus syndrome (HCC) S/P PPM  8. Osteopenia of necks of both femurs DEXA showed T score of -2.4 Will hold off any meds right now Started on calcium and Vit D Possible repeat DEXa in one year if she is stable  9. Essential hypertension On Cozaar and Lopressor  10 Poor Vision Has Caregivers   Labs/tests ordered:  * No order type specified * Next appt:   03/18/2022

## 2022-02-13 ENCOUNTER — Encounter: Payer: Medicare Other | Admitting: Internal Medicine

## 2022-02-13 DIAGNOSIS — R278 Other lack of coordination: Secondary | ICD-10-CM | POA: Diagnosis not present

## 2022-02-13 DIAGNOSIS — M25511 Pain in right shoulder: Secondary | ICD-10-CM | POA: Diagnosis not present

## 2022-02-13 DIAGNOSIS — M6389 Disorders of muscle in diseases classified elsewhere, multiple sites: Secondary | ICD-10-CM | POA: Diagnosis not present

## 2022-02-13 DIAGNOSIS — R531 Weakness: Secondary | ICD-10-CM | POA: Diagnosis not present

## 2022-02-26 ENCOUNTER — Other Ambulatory Visit: Payer: Self-pay | Admitting: Orthopedic Surgery

## 2022-02-26 DIAGNOSIS — J449 Chronic obstructive pulmonary disease, unspecified: Secondary | ICD-10-CM

## 2022-02-26 MED ORDER — BUDESONIDE-FORMOTEROL FUMARATE 160-4.5 MCG/ACT IN AERO: 2.0000 | INHALATION_SPRAY | Freq: Two times a day (BID) | RESPIRATORY_TRACT | 5 refills | Status: DC

## 2022-03-04 DIAGNOSIS — M25511 Pain in right shoulder: Secondary | ICD-10-CM | POA: Diagnosis not present

## 2022-03-05 DIAGNOSIS — R278 Other lack of coordination: Secondary | ICD-10-CM | POA: Diagnosis not present

## 2022-03-05 DIAGNOSIS — M25511 Pain in right shoulder: Secondary | ICD-10-CM | POA: Diagnosis not present

## 2022-03-05 DIAGNOSIS — M6389 Disorders of muscle in diseases classified elsewhere, multiple sites: Secondary | ICD-10-CM | POA: Diagnosis not present

## 2022-03-05 DIAGNOSIS — R531 Weakness: Secondary | ICD-10-CM | POA: Diagnosis not present

## 2022-03-06 ENCOUNTER — Other Ambulatory Visit: Payer: Self-pay

## 2022-03-06 ENCOUNTER — Encounter (HOSPITAL_BASED_OUTPATIENT_CLINIC_OR_DEPARTMENT_OTHER): Payer: Self-pay

## 2022-03-06 ENCOUNTER — Emergency Department (HOSPITAL_BASED_OUTPATIENT_CLINIC_OR_DEPARTMENT_OTHER): Payer: Medicare Other | Admitting: Radiology

## 2022-03-06 ENCOUNTER — Emergency Department (HOSPITAL_BASED_OUTPATIENT_CLINIC_OR_DEPARTMENT_OTHER)
Admission: EM | Admit: 2022-03-06 | Discharge: 2022-03-06 | Disposition: A | Discharge status: Home / Self Care | Payer: Medicare Other | Attending: Emergency Medicine | Admitting: Emergency Medicine

## 2022-03-06 DIAGNOSIS — M25552 Pain in left hip: Secondary | ICD-10-CM | POA: Diagnosis not present

## 2022-03-06 MED ORDER — DICLOFENAC SODIUM 1 % EX GEL: 4.0000 g | Freq: Four times a day (QID) | CUTANEOUS | 0 refills | Status: DC

## 2022-03-06 NOTE — ED Triage Notes (Signed)
Patient here POV from Home with Caregiver.  Endorses Yesterday PM, "twisting the wrong way" and hearing a "Pop" to her Left Hip causing Pain. No Fall.   NAD Noted during Triage. Ambulatory with Gilford Rile.

## 2022-03-06 NOTE — Discharge Instructions (Signed)
There is not an obvious break to your hip.  Please apply the gel as instructed and then take Tylenol around-the-clock for at least the next few days.  Please follow-up with your family doctor in the office.  If this pain worsens or if you are unable to walk then please return to the emergency department for repeat evaluation.  If this persist your doctor may also elect to do further imaging.

## 2022-03-06 NOTE — ED Provider Notes (Signed)
[place] EMERGENCY DEPT Provider Note   CSN: [id number] Arrival date & time: 03/06/22  1123     History  Chief Complaint  Patient presents with   Hip Pain    Jody Blake is a 88 y.o. female.  88 yo F with a chief complaints of left buttock pain.  She felt a pop when she turned to the left and threw her clothing onto her bed.  Since then she has had some pain to the area.  Has been able to ambulate but with some discomfort.  Denies overt trauma.  The history is provided by the patient and a relative.  Hip Pain       Home Medications Prior to Admission medications   Medication Sig Start Date End Date Taking? Authorizing Provider  diclofenac Sodium (VOLTAREN) 1 % GEL Apply 4 g topically 4 (four) times daily. 03/06/22  Yes Deno Etienne, DO  albuterol (VENTOLIN HFA) 108 (90 Base) MCG/ACT inhaler Inhale 2 puffs into the lungs every 6 (six) hours as needed for wheezing or shortness of breath.    [provider]  Azelastine HCl 137 MCG/SPRAY SOLN USE 1 TO 2 SPRAYS INTO BOTH NOSTRILS TWICE DAILY. 09/24/21   Royal Hawthorn, NP  budesonide-formoterol (SYMBICORT) 160-4.5 MCG/ACT inhaler Inhale 2 puffs into the lungs 2 (two) times daily. 02/26/22   [place], Amy E, NP  Calcium 600-10 MG-MCG CHEW Chew 1 capsule by mouth daily. 12/12/21   [name], MD  Cholecalciferol (VITAMIN D) 125 MCG (5000 UT) CAPS Take 1 capsule by mouth daily. 11/28/21   [name], MD  COVID-19 mRNA vaccine [phone] (COMIRNATY) syringe Inject into the muscle. 01/28/22   Carlyle Basques, MD  dorzolamide (TRUSOPT) 2 % ophthalmic solution 1 drop 2 (two) times daily.    [provider]  famotidine (PEPCID) 40 MG tablet Take 1 tablet (40 mg total) by mouth at bedtime. 11/28/21   [name], MD  fluticasone (FLONASE) 50 MCG/ACT nasal spray USE 2 SPRAYS EACH NOSTRIL ONCE A DAY AS NEEDED FOR ALLERGIES OR CONGESTION. 01/04/21   Deneise Lever, MD  gabapentin (NEURONTIN) 100 MG  capsule Take 1 capsule (100 mg total) by mouth at bedtime. 07/18/21   [name], MD  guaiFENesin (MUCINEX) 600 MG 12 hr tablet Take 1 tablet (600 mg total) by mouth 2 (two) times daily. Patient taking differently: Take 600 mg by mouth 2 (two) times daily as needed. 09/06/21   Royal Hawthorn, NP  latanoprost (XALATAN) 0.005 % ophthalmic solution Place 1 drop into both eyes at bedtime.    [provider]  levothyroxine (SYNTHROID) 75 MCG tablet Take 1 tablet (75 mcg total) by mouth daily. 08/06/21   Royal Hawthorn, NP  loratadine (CLARITIN) 10 MG tablet Take 1 tablet (10 mg total) by mouth daily. 12/18/20   Royal Hawthorn, NP  losartan (COZAAR) 50 MG tablet Take 1 tablet (50 mg total) by mouth daily. 11/22/21   Royal Hawthorn, NP  Lutein 20 MG CAPS Take 1 capsule (20 mg total) by mouth daily. 07/23/21   [name], MD  Magnesium 250 MG TABS Take 1 tablet by mouth daily.    [provider]  metoprolol tartrate (LOPRESSOR) 50 MG tablet Take 1.5 tablets (75 mg total) by mouth in the morning. 07/24/21   [name], MD  montelukast (SINGULAIR) 10 MG tablet Take 1 tablet (10 mg total) by mouth daily. 07/24/21   [name], MD  Multiple Vitamins-Minerals (PRESERVISION AREDS  2) CAPS Take 1 capsule by mouth in the morning and at bedtime. 07/24/21   [name], MD  potassium chloride (KLOR-CON) 10 MEQ tablet Take 2 tablets (20 mEq total) by mouth daily. 08/06/21   Royal Hawthorn, NP  predniSONE (DELTASONE) 5 MG tablet Take 1 tablet (5 mg total) by mouth daily with breakfast. 07/18/21   [name], MD  Rivaroxaban (XARELTO) 15 MG TABS tablet TAKE 1 TABLET BY MOUTH  DAILY WITH SUPPER 01/02/22   [name], MD  sennosides-docusate sodium (SENOKOT-S) 8.6-50 MG tablet Take 2 tablets by mouth at bedtime.    [provider]  torsemide (DEMADEX) 20 MG tablet Take 1 tablet (20 mg total) by mouth daily as needed. 3 lb weight gain in 1 day or 5 lb in 1 week or excessive  edema 07/02/21   Royal Hawthorn, NP  torsemide (DEMADEX) 20 MG tablet Take 1.5 tablets (30 mg total) by mouth daily. 11/30/21   [name], MD      Allergies    Brimonidine tartrate-timolol, Penicillins, Breo ellipta [fluticasone furoate-vilanterol], and Vancomycin    Review of Systems   Review of Systems  Physical Exam Updated Vital Signs BP (!) 151/100 (BP Location: Right Arm)   Pulse 77   Temp 97.9 F (36.6 C) (Temporal)   Resp 18   Ht '5\' 3"'$  (1.6 m)   Wt 60.5 kg   LMP 04/08/1974   SpO2 100%   BMI 23.63 kg/m  Physical Exam Vitals and nursing note reviewed.  Constitutional:      General: She is not in acute distress.    Appearance: She is well-developed. She is not diaphoretic.  HENT:     Head: Normocephalic and atraumatic.  Eyes:     Pupils: Pupils are equal, round, and reactive to light.  Cardiovascular:     Rate and Rhythm: Normal rate and regular rhythm.     Heart sounds: No murmur heard.    No friction rub. No gallop.  Pulmonary:     Effort: Pulmonary effort is normal.     Breath sounds: No wheezing or rales.  Abdominal:     General: There is no distension.     Palpations: Abdomen is soft.     Tenderness: There is no abdominal tenderness.  Musculoskeletal:        General: No tenderness.     Cervical back: Normal range of motion and neck supple.     Comments: Tenderness mostly overlying the left buttock.  I am able to internally externally rotate the left lower extremity without eliciting any discomfort.  Pulse motor and sensation intact distally.  I am able to range the hip fairly well.  She does have some tenderness with extreme internal rotation.  No obvious midline spinal tenderness about her deformities.  No pelvic instability or tenderness with compression.  Skin:    General: Skin is warm and dry.  Neurological:     Mental Status: She is alert and oriented to person, place, and time.  Psychiatric:        Behavior: Behavior normal.     ED Results  / Procedures / Treatments   Labs (all labs ordered are listed, but only abnormal results are displayed) Labs Reviewed - No data to display  EKG None  Radiology DG Hip Unilat With Pelvis 2-3 Views Left  Result Date: 03/06/2022 CLINICAL DATA:  Twisting injury yesterday.  Left hip pain. EXAM: DG HIP (WITH OR WITHOUT PELVIS) 2-3V LEFT COMPARISON:  None Available. FINDINGS: AP pelvis with AP and frog-leg lateral views of the left hip. The bones appear adequately mineralized. There is no evidence of acute fracture, dislocation or femoral head osteonecrosis. The hip joint spaces appear preserved for age. There are degenerative changes within the visualized lower lumbar spine. Iliofemoral atherosclerosis noted bilaterally. No acute soft tissue findings are seen. IMPRESSION: No evidence of acute fracture or dislocation. Lower lumbar spondylosis. Electronically Signed   By: Richardean Sale M.D.   On: 03/06/2022 12:12    Procedures Procedures    Medications Ordered in ED Medications - No data to display  ED Course/ Medical Decision Making/ A&P                           Medical Decision Making Amount and/or Complexity of Data Reviewed Radiology: ordered.   88 yo F with a chief complaint of left hip pain.  This occurred after she had turned abruptly to the left.  Atraumatic.  Plain film independently interpreted by me without obvious fracture.  She is ambulatory.  Will prescribe diclofenac gel.  Tylenol.  PCP follow-up.  12:16 PM:  I have discussed the diagnosis/risks/treatment options with the patient.  Evaluation and diagnostic testing in the emergency department does not suggest an emergent condition requiring admission or immediate intervention beyond what has been performed at this time.  They will follow up with PCP. We also discussed returning to the ED immediately if new or worsening sx occur. We discussed the sx which are most concerning (e.g., sudden worsening pain, fever, inability to  tolerate by mouth, inability to ambulate) that necessitate immediate return. Medications administered to the patient during their visit and any new prescriptions provided to the patient are listed below.  Medications given during this visit Medications - No data to display   The patient appears reasonably screen and/or stabilized for discharge and I doubt any other medical condition or other [facility] requiring further screening, evaluation, or treatment in the ED at this time prior to discharge.          Final Clinical Impression(s) / ED Diagnoses Final diagnoses:  Left hip pain    Rx / DC Orders ED Discharge Orders          Ordered    diclofenac Sodium (VOLTAREN) 1 % GEL  4 times daily        03/06/22 [place], [place], DO 03/06/22 1216

## 2022-03-07 ENCOUNTER — Ambulatory Visit (INDEPENDENT_AMBULATORY_CARE_PROVIDER_SITE_OTHER): Payer: Medicare Other

## 2022-03-07 DIAGNOSIS — I442 Atrioventricular block, complete: Secondary | ICD-10-CM | POA: Diagnosis not present

## 2022-03-09 LAB — CUP PACEART REMOTE DEVICE CHECK
Battery Impedance: 903 Ohm
Battery Remaining Longevity: 72 mo
Battery Voltage: 2.78 V
Brady Statistic RV Percent Paced: 95 %
Date Time Interrogation Session: 20231130170606
Implantable Lead Connection Status: 753985
Implantable Lead Connection Status: 753985
Implantable Lead Implant Date: 20090318
Implantable Lead Implant Date: 20090318
Implantable Lead Location: 753859
Implantable Lead Location: 753860
Implantable Lead Model: 5076
Implantable Lead Model: 5092
Implantable Pulse Generator Implant Date: 20160202
Lead Channel Impedance Value: 67 Ohm
Lead Channel Impedance Value: 759 Ohm
Lead Channel Pacing Threshold Amplitude: 0.875 V
Lead Channel Pacing Threshold Pulse Width: 0.4 ms
Lead Channel Setting Pacing Amplitude: 2.5 V
Lead Channel Setting Pacing Pulse Width: 0.4 ms
Lead Channel Setting Sensing Sensitivity: 4 mV
Zone Setting Status: 755011
Zone Setting Status: 755011

## 2022-03-11 DIAGNOSIS — M25511 Pain in right shoulder: Secondary | ICD-10-CM | POA: Diagnosis not present

## 2022-03-11 DIAGNOSIS — M6389 Disorders of muscle in diseases classified elsewhere, multiple sites: Secondary | ICD-10-CM | POA: Diagnosis not present

## 2022-03-11 DIAGNOSIS — R278 Other lack of coordination: Secondary | ICD-10-CM | POA: Diagnosis not present

## 2022-03-11 DIAGNOSIS — R531 Weakness: Secondary | ICD-10-CM | POA: Diagnosis not present

## 2022-03-15 ENCOUNTER — Encounter: Payer: Self-pay | Admitting: Cardiology

## 2022-03-15 ENCOUNTER — Ambulatory Visit: Payer: Medicare Other | Admitting: Cardiology

## 2022-03-15 ENCOUNTER — Ambulatory Visit: Payer: Medicare Other | Attending: Cardiology | Admitting: Cardiology

## 2022-03-15 VITALS — BP 130/80 | HR 78 | Ht 63.0 in | Wt 132.0 lb

## 2022-03-15 DIAGNOSIS — I4821 Permanent atrial fibrillation: Secondary | ICD-10-CM

## 2022-03-15 DIAGNOSIS — I10 Essential (primary) hypertension: Secondary | ICD-10-CM | POA: Diagnosis not present

## 2022-03-15 NOTE — Progress Notes (Signed)
Cardiology Office Note:    Date:  03/15/2022   ID:  Jody Blake, DOB 11/01/1925, MRN [id number]  PCP:  [name], MD   [facility] Providers Cardiologist:  Candee Furbish, MD     Referring MD: [name], MD    History of Present Illness:    Jody Blake is a 88 y.o. female here for follow-up of atrial fibrillation status post AV nodal ablation after aggressive medical therapy, pacemaker placement, pacemaker dependent with chronic diastolic heart failure.  Prior EP notes have been reviewed.  She has had previous pneumonia, COPD.  She had a Medtronic dual-chamber pacemaker implanted in 2009 with generator change in 2016.  She is programmed VVIR.  Had a prior leg infection with open sore.  Has had some baseline shortness of breath.  Her edema has resolved, she is now down to 60 mg of Lasix from 80.  Doing much better.  They are taking great care of her at [facility].  Still for quite some time and has felt some longstanding generalized fatigue/weakness.  I had contemplated discontinuing her metoprolol but she did remind me that at 1 point she had forgotten to take her metoprolol for few days and felt terrible.  I wonder if this was coincidence.  She could maybe try to taper this off and see how she feels.  Remember she is pacemaker dependent so it should not affect heart rate.  EKG ventricular paced 78 bpm A-fib underlying.  03/15/2022  Past Medical History:  Diagnosis Date   Anemia    Aortic stenosis    a. Echo 09/06/12 EF 55-60%, no WMA, G2DD, Ao valve sclerosis w/ mod stenosis, LA mildly dilated, PA pressure 35mHg   Asthmatic bronchitis    Complete heart block (HCC)    s/p permanent pacemaker 06/27/1999 (Battery change 06/2007 and 2016).  s/p AV nodal ablation by Dr ARayann Heman2016.   COPD (chronic obstructive pulmonary disease) (HCC)    Dr. YAnnamaria Boots  DDD (degenerative disc disease)    Diastolic dysfunction    a. Echo 09/06/12 EF 55-60%, no WMA, G2DD, Ao valve sclerosis w/  mod stenosis, LA mildly dilated, PA pressure 432mg   Diverticulosis    DVT (deep vein thrombosis) in pregnancy 1954   a. LLE   Hypertension    Hypothyroidism    on medication   Macular degeneration    Dr. KuEliezer Bottom Osteoarthrosis, unspecified whether generalized or localized, other specified sites    [place]F [place]ox[place]mal [place]) [place]   Stopped flecainide, on amiodarone but still has bouts of A FIB (mostly in mornings).    Pure hypercholesterolemia    Staghorn calculus    Left    Past Surgical History:  Procedure Laterality Date   APPENDECTOMY  ~ 1941   AV NODE ABLATION  05/10/2014   AV NODE ABLATION N/A 05/10/2014   Procedure: AV NODE ABLATION;  Surgeon: JaThompson GrayerMD;  [facility];  Service: Cardiovascular;  Laterality: N/A;   BUNIONECTOMY WITH HAMMERTOE RECONSTRUCTION Bilateral ~ [place]03/21/01   Medtronic PM implanted by Dr EdLeonia Reeves CARDIOVERSION N/A 12/02/2013   Procedure: CARDIOVERSION;  Surgeon: PaFay RecordsMD;  [facility]: Cardi[facility];  Laterality: N/A;   CATARACT EXTRACTION W/ INTRAOCULAR LENS  IMPLANT, BILATERAL Bilateral    COLONOSCOPY WITH PROPOFOL N/A 04/22/2013   Procedure: COLONOSCOPY WITH PROPOFOL;  Surgeon: MaGarlan FairMD;  Location: [facility] ENDOSCOPY;  Service:  Endoscopy;  Laterality: N/A;   DILATION AND CURETTAGE OF UTERUS  X 2   "when I was going thru menopause"   ESOPHAGOGASTRODUODENOSCOPY (EGD) WITH PROPOFOL N/A 04/22/2013   Procedure: ESOPHAGOGASTRODUODENOSCOPY (EGD) WITH PROPOFOL;  Surgeon: [name], MD;  Location: [facility] ENDOSCOPY;  Service: Endoscopy;  Laterality: N/A;   HERNIA REPAIR     INCISIONAL HERNIA REPAIR     INSERT / REPLACE / REMOVE PACEMAKER  06/2007   "took out the old; put in new"   INSERT / REPLACE / REMOVE PACEMAKER  05/10/2014   MDT PPM generator change by Dr [name]   JOINT REPLACEMENT     PARTIAL NEPHRECTOMY Left 05/1974   stone disease   PERMANENT PACEMAKER  GENERATOR CHANGE N/A 05/10/2014   Procedure: PERMANENT PACEMAKER GENERATOR CHANGE;  Surgeon: Thompson Grayer, MD;  Location: [facility] CATH LAB;  Service: Cardiovascular;  Laterality: N/A;   TONSILLECTOMY AND ADENOIDECTOMY  1930's   TOTAL KNEE ARTHROPLASTY Right 2001    Current Medications: Current Meds  Medication Sig   albuterol (VENTOLIN HFA) 108 (90 Base) MCG/ACT inhaler Inhale 2 puffs into the lungs every 6 (six) hours as needed for wheezing or shortness of breath.   Azelastine HCl 137 MCG/SPRAY SOLN USE 1 TO 2 SPRAYS INTO BOTH NOSTRILS TWICE DAILY.   budesonide-formoterol (SYMBICORT) 160-4.5 MCG/ACT inhaler Inhale 2 puffs into the lungs 2 (two) times daily.   Calcium 600-10 MG-MCG CHEW Chew 1 capsule by mouth daily.   Cholecalciferol (VITAMIN D) 125 MCG (5000 UT) CAPS Take 1 capsule by mouth daily.   COVID-19 mRNA vaccine 2023-2024 (COMIRNATY) syringe Inject into the muscle.   diclofenac Sodium (VOLTAREN) 1 % GEL Apply 4 g topically 4 (four) times daily.   dorzolamide (TRUSOPT) 2 % ophthalmic solution 1 drop 2 (two) times daily.   famotidine (PEPCID) 40 MG tablet Take 1 tablet (40 mg total) by mouth at bedtime.   fluticasone (FLONASE) 50 MCG/ACT nasal spray USE 2 SPRAYS EACH NOSTRIL ONCE A DAY AS NEEDED FOR ALLERGIES OR CONGESTION.   gabapentin (NEURONTIN) 100 MG capsule Take 1 capsule (100 mg total) by mouth at bedtime.   guaiFENesin (MUCINEX) 600 MG 12 hr tablet Take 1 tablet (600 mg total) by mouth 2 (two) times daily.   latanoprost (XALATAN) 0.005 % ophthalmic solution Place 1 drop into both eyes at bedtime.   levothyroxine (SYNTHROID) 75 MCG tablet Take 1 tablet (75 mcg total) by mouth daily.   loratadine (CLARITIN) 10 MG tablet Take 1 tablet (10 mg total) by mouth daily.   losartan (COZAAR) 50 MG tablet Take 1 tablet (50 mg total) by mouth daily.   Lutein 20 MG CAPS Take 1 capsule (20 mg total) by mouth daily.   Magnesium 250 MG TABS Take 1 tablet by mouth daily.   metoprolol tartrate  (LOPRESSOR) 50 MG tablet Take 1.5 tablets (75 mg total) by mouth in the morning.   montelukast (SINGULAIR) 10 MG tablet Take 1 tablet (10 mg total) by mouth daily.   Multiple Vitamins-Minerals (PRESERVISION AREDS 2) CAPS Take 1 capsule by mouth in the morning and at bedtime.   potassium chloride (KLOR-CON) 10 MEQ tablet Take 2 tablets (20 mEq total) by mouth daily.   predniSONE (DELTASONE) 5 MG tablet Take 1 tablet (5 mg total) by mouth daily with breakfast.   Rivaroxaban (XARELTO) 15 MG TABS tablet TAKE 1 TABLET BY MOUTH  DAILY WITH SUPPER   sennosides-docusate sodium (SENOKOT-S) 8.6-50 MG tablet Take 2 tablets by mouth at bedtime.  torsemide (DEMADEX) 20 MG tablet Take 1 tablet (20 mg total) by mouth daily as needed. 3 lb weight gain in 1 day or 5 lb in 1 week or excessive edema   torsemide (DEMADEX) 20 MG tablet Take 1.5 tablets (30 mg total) by mouth daily.     Allergies:   Brimonidine tartrate-timolol, Penicillins, Breo ellipta [fluticasone furoate-vilanterol], and Vancomycin   Social History   Socioeconomic History   Marital status: Widowed    Spouse name: Not on file   Number of children: 2   Years of education: Not on file   Highest education level: Not on file  Occupational History   Occupation: RETIRED    Employer: RETIRED    Comment: Family [employer] business  Tobacco Use   Smoking status: Never   Smokeless tobacco: Never  Vaping Use   Vaping Use: Never used  Substance and Sexual Activity   Alcohol use: Yes    Alcohol/week: 7.0 standard drinks of alcohol    Types: 7 Glasses of wine per week    Comment: occasionally   Drug use: No   Sexual activity: Not Currently    Partners: Male  Other Topics Concern   Not on file  Social History Narrative   Diet? Low salt, low fat      Do you drink/eat things with caffeine? yes      Marital status?                 married                   What year were you married? 1949      Do you live in a house, apartment, assisted living,  condo, trailer, etc.? apartment      Is it one or more stories? 3 stories      How many persons live in your home? Just me      Do you have any pets in your home? (please list) no      Current or past profession: accounting      Do you exercise?          yes                            Type & how often? Walk, class, prescribed daily      Do you have a living will? yes      Do you have a DNR form?     yes                             If not, do you want to discuss one?      Do you have signed POA/HPOA for forms? no         Social Determinants of Health   Financial Resource Strain: Low Risk  (02/16/2021)   Overall Financial Resource Strain (CARDIA)    Difficulty of Paying Living Expenses: Not hard at all  Food Insecurity: No Food Insecurity (02/16/2021)   Hunger Vital Sign    Worried About Running Out of Food in the Last Year: Never true    Ran Out of Food in the Last Year: Never true  Transportation Needs: No Transportation Needs (02/16/2021)   PRAPARE - Hydrologist (Medical): No    Lack of Transportation (Non-Medical): No  Physical Activity: Sufficiently Active (02/16/2021)   Exercise Vital Sign  Days of Exercise per Week: 7 days    Minutes of Exercise per Session: 30 min  Stress: No Stress Concern Present (02/16/2021)   [place]    Feeling of Stress : Not at all  Social Connections: Moderately Isolated (02/16/2021)   Social Connection and Isolation Panel [NHANES]    Frequency of Communication with Friends and Family: More than three times a week    Frequency of Social Gatherings with Friends and Family: More than three times a week    Attends Religious Services: Never    Marine scientist or Organizations: Yes    Attends Music therapist: More than 4 times per year    Marital Status: Widowed     Family History: The patient's family history includes  Breast cancer in her mother; Heart attack in her brother; Hypertension in her father; Malignant hypertension in her father; Renal Disease in her father; Stroke in her brother.  ROS:   Please see the history of present illness.     All other systems reviewed and are negative.  EKGs/Labs/Other Studies Reviewed:    The following studies were reviewed today: Echocardiogram 01/15/2019:   1. Left ventricular ejection fraction, by visual estimation, is >75%. The  left ventricle has hyperdynamic function. There is moderately increased  left ventricular hypertrophy.   2. Abnormal septal motion consistent with RV pacemaker.   3. Left ventricular diastolic Doppler parameters are indeterminate  pattern of LV diastolic filling.   4. Global right ventricle has mildly reduced systolic function.The right  ventricular size is normal. No increase in right ventricular wall  thickness.   5. Left atrial size was severely dilated.   6. Right atrial size was severely dilated.   7. Small pericardial effusion.   8. Moderate aortic valve annular calcification.   9. Moderate mitral annular calcification.  10. The mitral valve is normal in structure. Trace mitral valve  regurgitation.  11. The tricuspid valve is normal in structure. Tricuspid valve  regurgitation moderate.  12. The aortic valve is tricuspid Aortic valve regurgitation is trivial by  color flow Doppler. Moderate aortic valve stenosis.  13. There is Moderate calcification of the aortic valve.  14. There is Moderate thickening of the aortic valve.  15. Calcified leaflets with limited leaflet excursion. Visually appears to  minimally open.  16. The pulmonic valve was normal in structure. Pulmonic valve  regurgitation is mild by color flow Doppler.  17. The aortic root was not well visualized.  18. Moderately elevated pulmonary artery systolic pressure.  19. A pacer wire is visualized in the RA and RV.  20. The inferior vena cava is normal in  size with greater than 50%  respiratory variability, suggesting right atrial pressure of 3 mmHg.   Recent Labs: 04/11/2021: B Natriuretic Peptide 451.3 06/28/2021: Magnesium 2.6; Magnesium 2.6 11/15/2021: ALT 41; Hemoglobin 13.1; Platelets 249; TSH 2.57 12/20/2021: BUN 46; Creatinine 1.5; Potassium 3.7; Sodium 143  Recent Lipid Panel    Component Value Date/Time   CHOL 138 11/04/2017 0600   TRIG 53 11/04/2017 0600   HDL 70 11/04/2017 0600   CHOLHDL 3.1 06/25/2017 0229   VLDL 11 06/25/2017 0229   LDLCALC 58 11/04/2017 0600     Risk Assessment/Calculations:          Physical Exam:    VS:  BP 130/80 (BP Location: Left Arm, Patient Position: Sitting, Cuff Size: Normal)   Pulse 78   Ht '5\' 3"'$  (  1.6 m)   Wt 132 lb (59.9 kg)   LMP 04/08/1974   SpO2 96%   BMI 23.38 kg/m     Wt Readings from Last 3 Encounters:  03/15/22 132 lb (59.9 kg)  03/06/22 133 lb 6.1 oz (60.5 kg)  02/12/22 133 lb 6.4 oz (60.5 kg)     GEN:  Well nourished, well developed in no acute distress HEENT: Normal NECK: No JVD; No carotid bruits LYMPHATICS: No lymphadenopathy CARDIAC: RRR, 1/6 systolic murmur,no rubs, gallops RESPIRATORY:  Clear to auscultation without rales, wheezing or rhonchi  ABDOMEN: Soft, non-tender, non-distended MUSCULOSKELETAL:  No edema; No deformity  SKIN: Warm and dry NEUROLOGIC:  Alert and oriented x 3 PSYCHIATRIC:  Normal affect   ASSESSMENT:    1. Permanent atrial fibrillation ([place])   2. Essential hypertension     PLAN:    In order of problems listed above:  AV nodal ablation and pacemaker dependent atrial fibrillation - No changes EP notes continue to be reviewed.  Chronic anticoagulation - Continuing with Xarelto 15 mg.    CHA2DS2-VASc score 5 with prior TIA x2.  Moderate aortic stenosis - Murmur heard on exam.  Last echocardiogram 2020.  We will continue to monitor.  Chronic diastolic heart failure - Maintaining fluid balance very well.  Maintaining weight.   Has battled with edema lower extremity.  Lasix has been increased to 80 mg from 60 previously in May 2022 at [place].  Prior BNP has been elevated.  Emergency department visit 08/30/2020. She is now back to '60mg'$    Relative hypotension - Her amlodipine was discontinued secondary to lower blood pressures.  Today she is 110/78.  Much improved.  If necessary, it would not be unreasonable to taper off her metoprolol to see how this makes her feel.  Remember, she has had an AV nodal ablation and is pacemaker dependent.  Her metoprolol is not affecting her heart rate per se.  It may be assisting with her chronic diastolic heart failure with relaxing her contractile performance however.  COPD - Has been on chronic prednisone followed by Dr. [name].     Medication Adjustments/Labs and Tests Ordered: Current medicines are reviewed at length with the patient today.  Concerns regarding medicines are outlined above.  Orders Placed This Encounter  Procedures   EKG 12-Lead    No orders of the defined types were placed in this encounter.    Patient Instructions  Medication Instructions:  The current medical regimen is effective;  continue present plan and medications.  *If you need a refill on your cardiac medications before your next appointment, please call your pharmacy*  Follow-Up: At [facility], you and your health needs are our priority.  As part of our continuing mission to provide you with exceptional heart care, we have created designated Provider Care Teams.  These Care Teams include your primary Cardiologist (physician) and Advanced Practice Providers (APPs -  Physician Assistants and Nurse Practitioners) who all work together to provide you with the care you need, when you need it.  We recommend signing up for the patient portal called "MyChart".  Sign up information is provided on this After Visit Summary.  MyChart is used to connect with patients for Virtual Visits  (Telemedicine).  Patients are able to view lab/test results, encounter notes, upcoming appointments, etc.  Non-urgent messages can be sent to your provider as well.   To learn more about what you can do with MyChart, go to [email or web address].    Your next  appointment:   1 year(s)  The format for your next appointment:   In Person  Provider:   Candee Furbish, MD      Important Information About Sugar         Signed, Candee Furbish, MD  03/15/2022 4:52 PM    [place]

## 2022-03-15 NOTE — Patient Instructions (Signed)
Medication Instructions:  The current medical regimen is effective;  continue present plan and medications.  *If you need a refill on your cardiac medications before your next appointment, please call your pharmacy*  Follow-Up: At [facility], you and your health needs are our priority.  As part of our continuing mission to provide you with exceptional heart care, we have created designated Provider Care Teams.  These Care Teams include your primary Cardiologist (physician) and Advanced Practice Providers (APPs -  Physician Assistants and Nurse Practitioners) who all work together to provide you with the care you need, when you need it.  We recommend signing up for the patient portal called "MyChart".  Sign up information is provided on this After Visit Summary.  MyChart is used to connect with patients for Virtual Visits (Telemedicine).  Patients are able to view lab/test results, encounter notes, upcoming appointments, etc.  Non-urgent messages can be sent to your provider as well.   To learn more about what you can do with MyChart, go to [email or web address].    Your next appointment:   1 year(s)  The format for your next appointment:   In Person  Provider:   Mark Skains, MD      Important Information About Sugar       

## 2022-03-18 ENCOUNTER — Ambulatory Visit (INDEPENDENT_AMBULATORY_CARE_PROVIDER_SITE_OTHER): Payer: Medicare Other | Admitting: Adult Health

## 2022-03-18 ENCOUNTER — Encounter: Payer: Self-pay | Admitting: Adult Health

## 2022-03-18 VITALS — BP 130/84 | HR 86 | Temp 97.9°F | Resp 17 | Ht 63.0 in

## 2022-03-18 DIAGNOSIS — Z0000 Encounter for general adult medical examination without abnormal findings: Secondary | ICD-10-CM

## 2022-03-18 NOTE — Patient Instructions (Signed)
Jody Blake , Thank you for taking time to come for your Medicare Wellness Visit. I appreciate your ongoing commitment to your health goals. Please review the following plan we discussed and let me know if I can assist you in the future.   Screening recommendations/referrals: Colonoscopy aged out  Mammogram aged out Bone Density Done 2023 due every two years Recommended yearly ophthalmology/optometry visit for glaucoma screening and checkup Recommended yearly dental visit for hygiene and checkup  Vaccinations: Influenza vaccine- due annually in September/October Pneumococcal vaccine up to date Tdap vaccine up to date Shingles vaccine up to date    Advanced directives: reviewed  Conditions/risks identified: cardiac risk   Next appointment: 1 year   Preventive Care 9 Years and Older, Female Preventive care refers to lifestyle choices and visits with your health care provider that can promote health and wellness. What does preventive care include? A yearly physical exam. This is also called an annual well check. Dental exams once or twice a year. Routine eye exams. Ask your health care provider how often you should have your eyes checked. Personal lifestyle choices, including: Daily care of your teeth and gums. Regular physical activity. Eating a healthy diet. Avoiding tobacco and drug use. Limiting alcohol use. Practicing safe sex. Taking low-dose aspirin every day. Taking vitamin and mineral supplements as recommended by your health care provider. What happens during an annual well check? The services and screenings done by your health care provider during your annual well check will depend on your age, overall health, lifestyle risk factors, and family history of disease. Counseling  Your health care provider may ask you questions about your: Alcohol use. Tobacco use. Drug use. Emotional well-being. Home and relationship well-being. Sexual activity. Eating  habits. History of falls. Memory and ability to understand (cognition). Work and work Statistician. Reproductive health. Screening  You may have the following tests or measurements: Height, weight, and BMI. Blood pressure. Lipid and cholesterol levels. These may be checked every 5 years, or more frequently if you are over 18 years old. Skin check. Lung cancer screening. You may have this screening every year starting at age 6 if you have a 30-pack-year history of smoking and currently smoke or have quit within the past 15 years. Fecal occult blood test (FOBT) of the stool. You may have this test every year starting at age 49. Flexible sigmoidoscopy or colonoscopy. You may have a sigmoidoscopy every 5 years or a colonoscopy every 10 years starting at age 15. Hepatitis C blood test. Hepatitis B blood test. Sexually transmitted disease (STD) testing. Diabetes screening. This is done by checking your blood sugar (glucose) after you have not eaten for a while (fasting). You may have this done every 1-3 years. Bone density scan. This is done to screen for osteoporosis. You may have this done starting at age 36. Mammogram. This may be done every 1-2 years. Talk to your health care provider about how often you should have regular mammograms. Talk with your health care provider about your test results, treatment options, and if necessary, the need for more tests. Vaccines  Your health care provider may recommend certain vaccines, such as: Influenza vaccine. This is recommended every year. Tetanus, diphtheria, and acellular pertussis (Tdap, Td) vaccine. You may need a Td booster every 10 years. Zoster vaccine. You may need this after age 40. Pneumococcal 13-valent conjugate (PCV13) vaccine. One dose is recommended after age 28. Pneumococcal polysaccharide (PPSV23) vaccine. One dose is recommended after age 60. Talk to your  health care provider about which screenings and vaccines you need and how  often you need them. This information is not intended to replace advice given to you by your health care provider. Make sure you discuss any questions you have with your health care provider. Document Released: 04/21/2015 Document Revised: 12/13/2015 Document Reviewed: 01/24/2015 Elsevier Interactive Patient Education  2017 [place] Prevention in the Home Falls can cause injuries. They can happen to people of all ages. There are many things you can do to make your home safe and to help prevent falls. What can I do on the outside of my home? Regularly fix the edges of walkways and driveways and fix any cracks. Remove anything that might make you trip as you walk through a door, such as a raised step or threshold. Trim any bushes or trees on the path to your home. Use bright outdoor lighting. Clear any walking paths of anything that might make someone trip, such as rocks or tools. Regularly check to see if handrails are loose or broken. Make sure that both sides of any steps have handrails. Any raised decks and porches should have guardrails on the edges. Have any leaves, snow, or ice cleared regularly. Use sand or salt on walking paths during winter. Clean up any spills in your garage right away. This includes oil or grease spills. What can I do in the bathroom? Use night lights. Install grab bars by the toilet and in the tub and shower. Do not use towel bars as grab bars. Use non-skid mats or decals in the tub or shower. If you need to sit down in the shower, use a plastic, non-slip stool. Keep the floor dry. Clean up any water that spills on the floor as soon as it happens. Remove soap buildup in the tub or shower regularly. Attach bath mats securely with double-sided non-slip rug tape. Do not have throw rugs and other things on the floor that can make you trip. What can I do in the bedroom? Use night lights. Make sure that you have a light by your bed that is easy to  reach. Do not use any sheets or blankets that are too big for your bed. They should not hang down onto the floor. Have a firm chair that has side arms. You can use this for support while you get dressed. Do not have throw rugs and other things on the floor that can make you trip. What can I do in the kitchen? Clean up any spills right away. Avoid walking on wet floors. Keep items that you use a lot in easy-to-reach places. If you need to reach something above you, use a strong step stool that has a grab bar. Keep electrical cords out of the way. Do not use floor polish or wax that makes floors slippery. If you must use wax, use non-skid floor wax. Do not have throw rugs and other things on the floor that can make you trip. What can I do with my stairs? Do not leave any items on the stairs. Make sure that there are handrails on both sides of the stairs and use them. Fix handrails that are broken or loose. Make sure that handrails are as long as the stairways. Check any carpeting to make sure that it is firmly attached to the stairs. Fix any carpet that is loose or worn. Avoid having throw rugs at the top or bottom of the stairs. If you do have throw rugs, attach them  to the floor with carpet tape. Make sure that you have a light switch at the top of the stairs and the bottom of the stairs. If you do not have them, ask someone to add them for you. What else can I do to help prevent falls? Wear shoes that: Do not have high heels. Have rubber bottoms. Are comfortable and fit you well. Are closed at the toe. Do not wear sandals. If you use a stepladder: Make sure that it is fully opened. Do not climb a closed stepladder. Make sure that both sides of the stepladder are locked into place. Ask someone to hold it for you, if possible. Clearly mark and make sure that you can see: Any grab bars or handrails. First and last steps. Where the edge of each step is. Use tools that help you move  around (mobility aids) if they are needed. These include: Canes. Walkers. Scooters. Crutches. Turn on the lights when you go into a dark area. Replace any light bulbs as soon as they burn out. Set up your furniture so you have a clear path. Avoid moving your furniture around. If any of your floors are uneven, fix them. If there are any pets around you, be aware of where they are. Review your medicines with your doctor. Some medicines can make you feel dizzy. This can increase your chance of falling. Ask your doctor what other things that you can do to help prevent falls. This information is not intended to replace advice given to you by your health care provider. Make sure you discuss any questions you have with your health care provider. Document Released: 01/19/2009 Document Revised: 08/31/2015 Document Reviewed: 04/29/2014 Elsevier Interactive Patient Education  2017 [employer].

## 2022-03-18 NOTE — Progress Notes (Unsigned)
Subjective:   Jody Blake is a 88 y.o. female who presents for Medicare Annual (Subsequent) preventive examination at [facility] in independent living.   Review of Systems     Cardiac Risk Factors include: advanced age (>75mn, >>61women);hypertension     Objective:    Today's Vitals   03/18/22 1651  BP: 130/84  Pulse: 86  Resp: 17  Temp: 97.9 F (36.6 C)  TempSrc: Temporal  SpO2: 96%  Height: '5\' 3"'$  (1.6 m)   Body mass index is 23.38 kg/m.     03/18/2022    4:13 PM 03/06/2022   11:31 AM 02/12/2022   10:26 AM 07/02/2021    2:12 PM 05/24/2021   10:26 AM 05/21/2021    9:46 AM 05/17/2021    4:31 PM  Advanced Directives  Does Patient Have a Medical Advance Directive? Yes No Yes Yes Yes Yes Yes  Type of AParamedicof ARiceLiving will;Out of facility DNR (pink MOST or yellow form)  HYanceyLiving will;Out of facility DNR (pink MOST or yellow form) HMount SterlingLiving will;Out of facility DNR (pink MOST or yellow form) HHale CenterLiving will;Out of facility DNR (pink MOST or yellow form) HPena BlancaLiving will;Out of facility DNR (pink MOST or yellow form) HFranklinLiving will;Out of facility DNR (pink MOST or yellow form)  Does patient want to make changes to medical advance directive?   No - Patient declined No - Patient declined No - Patient declined No - Patient declined No - Patient declined  Copy of [place] Chart? Yes - validated most recent copy scanned in chart (See row information)  Yes - validated most recent copy scanned in chart (See row information)      Would patient like information on creating a medical advance directive?  No - Patient declined       Pre-existing out of facility DNR order (yellow form or pink MOST form)    Yellow form placed in chart (order not valid for inpatient use);Pink MOST form  placed in chart (order not valid for inpatient use) Yellow form placed in chart (order not valid for inpatient use);Pink MOST form placed in chart (order not valid for inpatient use) Yellow form placed in chart (order not valid for inpatient use);Pink MOST form placed in chart (order not valid for inpatient use) Yellow form placed in chart (order not valid for inpatient use);Pink MOST form placed in chart (order not valid for inpatient use)    Current Medications (verified) Outpatient Encounter Medications as of 03/18/2022  Medication Sig   albuterol (VENTOLIN HFA) 108 (90 Base) MCG/ACT inhaler Inhale 2 puffs into the lungs every 6 (six) hours as needed for wheezing or shortness of breath.   Azelastine HCl 137 MCG/SPRAY SOLN USE 1 TO 2 SPRAYS INTO BOTH NOSTRILS TWICE DAILY.   budesonide-formoterol (SYMBICORT) 160-4.5 MCG/ACT inhaler Inhale 2 puffs into the lungs 2 (two) times daily.   Calcium 600-10 MG-MCG CHEW Chew 1 capsule by mouth daily.   Cholecalciferol (VITAMIN D) 125 MCG (5000 UT) CAPS Take 1 capsule by mouth daily.   COVID-19 mRNA vaccine 2023-2024 (COMIRNATY) syringe Inject into the muscle.   diclofenac Sodium (VOLTAREN) 1 % GEL Apply 4 g topically 4 (four) times daily.   dorzolamide (TRUSOPT) 2 % ophthalmic solution 1 drop 2 (two) times daily.   famotidine (PEPCID) 40 MG tablet Take 1 tablet (40 mg total) by mouth at bedtime.  fluticasone (FLONASE) 50 MCG/ACT nasal spray USE 2 SPRAYS EACH NOSTRIL ONCE A DAY AS NEEDED FOR ALLERGIES OR CONGESTION.   gabapentin (NEURONTIN) 100 MG capsule Take 1 capsule (100 mg total) by mouth at bedtime.   guaiFENesin (MUCINEX) 600 MG 12 hr tablet Take 1 tablet (600 mg total) by mouth 2 (two) times daily.   latanoprost (XALATAN) 0.005 % ophthalmic solution Place 1 drop into both eyes at bedtime.   levothyroxine (SYNTHROID) 75 MCG tablet Take 1 tablet (75 mcg total) by mouth daily.   loratadine (CLARITIN) 10 MG tablet Take 1 tablet (10 mg total) by mouth  daily.   losartan (COZAAR) 50 MG tablet Take 1 tablet (50 mg total) by mouth daily.   Lutein 20 MG CAPS Take 1 capsule (20 mg total) by mouth daily.   Magnesium 250 MG TABS Take 1 tablet by mouth daily.   metoprolol tartrate (LOPRESSOR) 50 MG tablet Take 1.5 tablets (75 mg total) by mouth in the morning.   montelukast (SINGULAIR) 10 MG tablet Take 1 tablet (10 mg total) by mouth daily.   Multiple Vitamins-Minerals (PRESERVISION AREDS 2) CAPS Take 1 capsule by mouth in the morning and at bedtime.   potassium chloride (KLOR-CON) 10 MEQ tablet Take 2 tablets (20 mEq total) by mouth daily.   predniSONE (DELTASONE) 5 MG tablet Take 1 tablet (5 mg total) by mouth daily with breakfast.   Rivaroxaban (XARELTO) 15 MG TABS tablet TAKE 1 TABLET BY MOUTH  DAILY WITH SUPPER   sennosides-docusate sodium (SENOKOT-S) 8.6-50 MG tablet Take 2 tablets by mouth at bedtime.   torsemide (DEMADEX) 20 MG tablet Take 1 tablet (20 mg total) by mouth daily as needed. 3 lb weight gain in 1 day or 5 lb in 1 week or excessive edema   torsemide (DEMADEX) 20 MG tablet Take 1.5 tablets (30 mg total) by mouth daily.   No facility-administered encounter medications on file as of 03/18/2022.    Allergies (verified) Brimonidine tartrate-timolol, Penicillins, Breo ellipta [fluticasone furoate-vilanterol], and Vancomycin   History: Past Medical History:  Diagnosis Date   Anemia    Aortic stenosis    a. Echo 09/06/12 EF 55-60%, no WMA, G2DD, Ao valve sclerosis w/ mod stenosis, LA mildly dilated, PA pressure 6mHg   Asthmatic bronchitis    Complete heart block (HCC)    s/p permanent pacemaker 06/27/1999 (Battery change 06/2007 and 2016).  s/p AV nodal ablation by Dr ARayann Heman2016.   COPD (chronic obstructive pulmonary disease) (HCC)    Dr. YAnnamaria Boots  DDD (degenerative disc disease)    Diastolic dysfunction    a. Echo 09/06/12 EF 55-60%, no WMA, G2DD, Ao valve sclerosis w/ mod stenosis, LA mildly dilated, PA pressure 460mg    Diverticulosis    DVT (deep vein thrombosis) in pregnancy 1954   a. LLE   Hypertension    Hypothyroidism    on medication   Macular degeneration    Dr. KuEliezer Bottom Osteoarthrosis, unspecified whether generalized or localized, other specified sites    PAF (paroxysmal atrial fibrillation) ([place]   Stopped flecainide, on amiodarone but still has bouts of A FIB (mostly in mornings).    Pure hypercholesterolemia    Staghorn calculus    Left   Past Surgical History:  Procedure Laterality Date   APPENDECTOMY  ~ 1941   AV NODE ABLATION  05/10/2014   AV NODE ABLATION N/A 05/10/2014   Procedure: AV NODE ABLATION;  Surgeon: JaThompson GrayerMD;  [facility];  Service: Cardiovascular;  Laterality: N/A;   BUNIONECTOMY WITH HAMMERTOE RECONSTRUCTION Bilateral ~ [place]  06/27/99   Medtronic PM implanted by Dr [name]   CARDIOVERSION N/A 12/02/2013   Procedure: CARDIOVERSION;  Surgeon: [name], MD;  Location: [place];  Service: Cardiovascular;  Laterality: N/A;   CATARACT EXTRACTION W/ INTRAOCULAR LENS  IMPLANT, BILATERAL Bilateral    COLONOSCOPY WITH PROPOFOL N/A 04/22/2013   Procedure: COLONOSCOPY WITH PROPOFOL;  Surgeon: [name], MD;  Location: [facility] ENDOSCOPY;  Service: Endoscopy;  Laterality: N/A;   DILATION AND CURETTAGE OF UTERUS  X 2   "when I was going thru menopause"   ESOPHAGOGASTRODUODENOSCOPY (EGD) WITH PROPOFOL N/A 04/22/2013   Procedure: ESOPHAGOGASTRODUODENOSCOPY (EGD) WITH PROPOFOL;  Surgeon: [name], MD;  Location: [facility] ENDOSCOPY;  Service: Endoscopy;  Laterality: N/A;   HERNIA REPAIR     INCISIONAL HERNIA REPAIR     INSERT / REPLACE / REMOVE PACEMAKER  06/2007   "took out the old; put in new"   INSERT / REPLACE / REMOVE PACEMAKER  05/10/2014   MDT PPM generator change by Dr [name]   JOINT REPLACEMENT     PARTIAL NEPHRECTOMY Left 05/1974   stone disease   PERMANENT PACEMAKER GENERATOR CHANGE N/A 05/10/2014   Procedure: PERMANENT  PACEMAKER GENERATOR CHANGE;  Surgeon: Thompson Grayer, MD;  [facility] C[facility] LAB;  Service: Cardiovascular;  Laterality: N/A;   TONSILLECTOMY AND ADENOIDECTOMY  1930's   TOTAL KNEE ARTHROPLASTY Right 2001   Family History  Problem Relation Age of Onset   Malignant hypertension Father    Hypertension Father    Renal Disease Father    Breast cancer Mother    Heart attack Brother    Stroke Brother    Social History   Socioeconomic History   Marital status: Widowed    Spouse name: Not on file   Number of children: 2   Years of education: Not on file   Highest education level: Not on file  Occupational History   Occupation: RETIRED    Employer: RETIRED    Comment: [employer] business  Tobacco Use   Smoking status: Never   Smokeless tobacco: Never  Vaping Use   Vaping Use: Never used  Substance and Sexual Activity   Alcohol use: Yes    Alcohol/week: 7.0 standard drinks of alcohol    Types: 7 Glasses of wine per week    Comment: occasionally   Drug use: No   Sexual activity: Not Currently    Partners: Male  Other Topics Concern   Not on file  Social History Narrative   Diet? Low salt, low fat      Do you drink/eat things with caffeine? yes      Marital status?                 married                   What year were you married? 1949      Do you live in a house, apartment, assisted living, condo, trailer, etc.? apartment      Is it one or more stories? 3 stories      How many persons live in your home? Just me      Do you have any pets in your home? (please list) no      Current or past profession: accounting      Do you exercise?  yes                            Type & how often? Walk, class, prescribed daily      Do you have a living will? yes      Do you have a DNR form?     yes                             If not, do you want to discuss one?      Do you have signed POA/HPOA for forms? no         Social Determinants of Health   Financial Resource  Strain: Low Risk  (02/16/2021)   Overall Financial Resource Strain (CARDIA)    Difficulty of Paying Living Expenses: Not hard at all  Food Insecurity: No Food Insecurity (02/16/2021)   Hunger Vital Sign    Worried About Running Out of Food in the Last Year: Never true    Ran Out of Food in the Last Year: Never true  Transportation Needs: No Transportation Needs (02/16/2021)   PRAPARE - Hydrologist (Medical): No    Lack of Transportation (Non-Medical): No  Physical Activity: Sufficiently Active (02/16/2021)   Exercise Vital Sign    Days of Exercise per Week: 7 days    Minutes of Exercise per Session: 30 min  Stress: No Stress Concern Present (02/16/2021)   [place]    Feeling of Stress : Not at all  Social Connections: Moderately Isolated (02/16/2021)   Social Connection and Isolation Panel [NHANES]    Frequency of Communication with Friends and Family: More than three times a week    Frequency of Social Gatherings with Friends and Family: More than three times a week    Attends Religious Services: Never    Marine scientist or Organizations: Yes    Attends Music therapist: More than 4 times per year    Marital Status: Widowed    Tobacco Counseling Counseling given: Not Answered   Clinical Intake:  Pre-visit preparation completed: No  Pain : No/denies pain     BMI - recorded: 23.38 Nutritional Status: BMI of 19-24  Normal Nutritional Risks: None Diabetes: No  How often do you need to have someone help you when you read instructions, pamphlets, or other written materials from your doctor or pharmacy?: 3 - Sometimes What is the last grade level you completed in school?: 3 years of college  Diabetic?no  Interpreter Needed?: No      Activities of Daily Living    03/18/2022    4:28 PM 03/18/2022    4:12 PM  In your present state of health, do you  have any difficulty performing the following activities:  Hearing? 1 1  Vision? 1 1  Difficulty concentrating or making decisions? 1 0  Comment names   Walking or climbing stairs? 0 0  Dressing or bathing? 0 0  Doing errands, shopping? 1 0  Preparing Food and eating ? Y   Using the Toilet? N   In the past six months, have you accidently leaked urine? Y   Do you have problems with loss of bowel control? N   Managing your Medications? Y   Managing your Finances? N   Housekeeping or managing your Housekeeping? Y     Patient Care  Team: [name], MD as PCP - General (Internal Medicine) Jerline Pain, MD as PCP - Cardiology (Cardiology) Thompson Grayer, MD as Consulting Physician (Cardiology) Deneise Lever, MD as Consulting Physician (Pulmonary Disease) Rutherford Guys, MD as Consulting Physician (Ophthalmology) [name], MD as Consulting Physician (Gastroenterology) Gaynelle Arabian, MD as Consulting Physician (Orthopedic Surgery)  Indicate any recent Medical Services you may have received from other than [facility] providers in the past year (date may be approximate).     Assessment:   This is a routine wellness examination for Jody Blake.  Hearing/Vision screen No results found.  Dietary issues and exercise activities discussed: Current Exercise Habits: Structured exercise class;Home exercise routine, Type of exercise: strength training/weights;stretching;walking, Time (Minutes): 30, Frequency (Times/Week): 7, Weekly Exercise (Minutes/Week): 210, Intensity: Mild, Exercise limited by: orthopedic condition(s)   Goals Addressed   None    Depression Screen    03/18/2022    4:27 PM 03/18/2022    4:12 PM 02/16/2021    4:09 PM 02/16/2021    3:48 PM 05/17/2020   11:53 AM 02/15/2020   10:14 AM 01/25/2020   10:09 AM  PHQ 2/9 Scores  PHQ - 2 Score 0 0 0 0 0 0 0    Fall Risk    03/18/2022    4:27 PM 03/18/2022    4:12 PM 02/12/2022   10:25 AM 07/02/2021    2:11 PM 05/09/2021    11:07 AM  [place] in the past year? 0 0 0 0 0  Number falls in past yr: 0 0 0 0 0  Injury with Fall? 0 0 0 0 0  Risk for fall due to : Impaired balance/gait Impaired balance/gait;Impaired mobility History of fall(s);Impaired balance/gait No Fall Risks No Fall Risks;Impaired balance/gait  Follow up Falls evaluation completed Falls evaluation completed Falls evaluation completed Falls evaluation completed Falls evaluation completed    FALL RISK PREVENTION PERTAINING TO THE HOME:  Any stairs in or around the home? No  If so, are there any without handrails? No  Home free of loose throw rugs in walkways, pet beds, electrical cords, etc? Yes  Adequate lighting in your home to reduce risk of falls? Yes   ASSISTIVE DEVICES UTILIZED TO PREVENT FALLS:  Life alert? Yes  Use of a cane, walker or w/c? Yes  Grab bars in the bathroom? Yes  Shower chair or bench in shower? Yes  Elevated toilet seat or a handicapped toilet? No   TIMED UP AND GO:  Was the test performed? Yes .  Length of time to ambulate 10 feet: 3 sec.   Gait slow and steady with assistive device  Cognitive Function:    02/16/2021    4:16 PM 05/27/2017   10:18 AM 03/26/2016   10:10 AM  MMSE - Mini Mental State Exam  Not completed: Refused    Orientation to time  5 5  Orientation to Place  5 5  Registration  3 3  Attention/ Calculation  5 5  Recall  3 3  Language- name 2 objects  2 2  Language- repeat  1 1  Language- follow 3 step command  3 3  Language- read & follow direction  1 1  Write a sentence  1 1  Copy design  0 1  Total score  29 30        02/16/2021    3:49 PM 02/15/2020   10:16 AM 02/05/2019   11:23 AM  6CIT Screen  What Year?  0 points 0 points 0 points  What month? 0 points 0 points 0 points  What time? 0 points 0 points 0 points  Count back from 20 0 points 0 points 0 points  Months in reverse 0 points 0 points 0 points  Repeat phrase 0 points 0 points 0 points  Total Score 0  points 0 points 0 points    Immunizations Immunization History  Administered Date(s) Administered   COVID-19, mRNA, vaccine(Comirnaty)12 years and older 01/28/2022   Influenza Split 01/07/2011   Influenza Whole 01/16/2009, 01/23/2010   Influenza, High Dose Seasonal PF 01/06/2013, 01/15/2019, 02/04/2020   Influenza,inj,Quad PF,6+ Mos 02/17/2015, 01/30/2018, 01/07/2022   Influenza,inj,quad, With Preservative 01/06/2017   Influenza-Unspecified 01/06/2014, 02/01/2016, 02/04/2020, 01/26/2021   Moderna Covid-19 Vaccine Bivalent Booster 59yr & up 08/09/2021   Moderna SARS-COV2 Booster Vaccination 08/18/2020   Moderna Sars-Covid-2 Vaccination 04/20/2019, 05/18/2019, 02/22/2020   Pneumococcal Conjugate-13 02/24/2019   Pneumococcal Polysaccharide-23 11/23/2013   Respiratory Syncytial Virus Vaccine,Recomb Aduvanted(Arexvy) 01/07/2022   Tdap 02/24/2019   Zoster Recombinat (Shingrix) 03/13/2017, 05/20/2017    TDAP status: Up to date  Flu Vaccine status: Up to date  Pneumococcal vaccine status: Up to date  Covid-19 vaccine status: Completed vaccines  Qualifies for Shingles Vaccine? Yes   Zostavax completed No   Shingrix Completed?: Yes  Screening Tests Health Maintenance  Topic Date Due   Medicare Annual Wellness (AWV)  03/19/2023   DTaP/Tdap/Td (2 - Td or Tdap) 02/23/2029   Pneumonia Vaccine 88 Years old  Completed   INFLUENZA VACCINE  Completed   COVID-19 Vaccine  Completed   Zoster Vaccines- Shingrix  Completed   HPV VACCINES  Aged Out   DEXA SCAN  Discontinued    Health Maintenance  There are no preventive care reminders to display for this patient.   Colorectal cancer screening: No longer required.   Mammogram status: No longer required due to age.  Bone Density status: Completed 2023. Results reflect: Bone density results: OSTEOPENIA. Repeat every 2 years.  Lung Cancer Screening: (Low Dose CT Chest recommended if Age 86-80years, 30 pack-year currently smoking OR  have quit w/in 15years.) does not qualify.   Lung Cancer Screening Referral: na  Additional Screening:  Hepatitis C Screening: does not qualify; Completed na  Vision Screening: Recommended annual ophthalmology exams for early detection of glaucoma and other disorders of the eye. Is the patient up to date with their annual eye exam?  Yes  Who is the provider or what is the name of the office in which the patient attends annual eye exams? [facility] If pt is not established with a provider, would they like to be referred to a provider to establish care? No .   Dental Screening: Recommended annual dental exams for proper oral hygiene  Community Resource Referral / Chronic Care Management: CRR required this visit?  No   CCM required this visit?  No      Plan:     I have personally reviewed and noted the following in the patient's chart:   Medical and social history Use of alcohol, tobacco or illicit drugs  Current medications and supplements including opioid prescriptions. Patient is not currently taking opioid prescriptions. Functional ability and status Nutritional status Physical activity Advanced directives List of other physicians Hospitalizations, surgeries, and ER visits in previous 12 months Vitals Screenings to include cognitive, depression, and falls Referrals and appointments  In addition, I have reviewed and discussed with patient certain preventive protocols, quality metrics, and best practice recommendations. A  written personalized care plan for preventive services as well as general preventive health recommendations were provided to patient.     Royal Hawthorn, NP   03/19/2022   Nurse Notes: na

## 2022-03-21 DIAGNOSIS — G629 Polyneuropathy, unspecified: Secondary | ICD-10-CM | POA: Diagnosis not present

## 2022-03-21 DIAGNOSIS — R55 Syncope and collapse: Secondary | ICD-10-CM | POA: Diagnosis not present

## 2022-03-21 LAB — BASIC METABOLIC PANEL
BUN: 38 — AB (ref 4–21)
CO2: 30 — AB (ref 13–22)
CO2: 30 — AB (ref 13–22)
Chloride: 102 (ref 99–108)
Chloride: 102 (ref 99–108)
Creatinine: 1.4 — AB (ref 0.5–1.1)
Creatinine: 1.4 — AB (ref 0.5–1.1)
Glucose: 97
Glucose: 97
Potassium: 3.8 mEq/L (ref 3.5–5.1)
Potassium: 3.8 mEq/L (ref 3.5–5.1)
Sodium: 141 (ref 137–147)
Sodium: 141 (ref 137–147)

## 2022-03-21 LAB — VITAMIN B12
Vitamin B-12: 450
Vitamin B-12: 450

## 2022-03-21 LAB — COMPREHENSIVE METABOLIC PANEL
Albumin: 4.1 (ref 3.5–5.0)
Albumin: 4.1 (ref 3.5–5.0)
Calcium: 9.6 (ref 8.7–10.7)
Calcium: 9.6 (ref 8.7–10.7)
Globulin: 2.3
Globulin: 2.3
eGFR: 35
eGFR: 35

## 2022-03-21 LAB — HEPATIC FUNCTION PANEL
ALT: 37 U/L — AB (ref 7–35)
ALT: 37 U/L — AB (ref 7–35)
AST: 34 (ref 13–35)
AST: 34 (ref 13–35)
Alkaline Phosphatase: 53 (ref 25–125)
Alkaline Phosphatase: 53 (ref 25–125)
Bilirubin, Total: 0.7
Bilirubin, Total: 0.7

## 2022-03-28 NOTE — Progress Notes (Signed)
Remote pacemaker transmission.   

## 2022-04-02 ENCOUNTER — Telehealth: Payer: Self-pay

## 2022-04-02 NOTE — Progress Notes (Signed)
abstraction

## 2022-04-02 NOTE — Telephone Encounter (Signed)
Attempted to call the patient to inform her that Dr. Lyndel Safe states her labs are good. Someone answered the phone twice and hung up.

## 2022-04-08 ENCOUNTER — Emergency Department (HOSPITAL_COMMUNITY): Payer: Medicare Other

## 2022-04-08 ENCOUNTER — Other Ambulatory Visit: Payer: Self-pay

## 2022-04-08 ENCOUNTER — Inpatient Hospital Stay (HOSPITAL_COMMUNITY)
Admission: EM | Admit: 2022-04-08 | Discharge: 2022-04-11 | DRG: 191 | Disposition: A | Discharge status: Nursing Home (Includes ICF) | Payer: Medicare Other | Source: Skilled Nursing Facility | Attending: Internal Medicine | Admitting: Internal Medicine

## 2022-04-08 ENCOUNTER — Encounter (HOSPITAL_COMMUNITY): Payer: Self-pay | Admitting: Family Medicine

## 2022-04-08 DIAGNOSIS — K219 Gastro-esophageal reflux disease without esophagitis: Secondary | ICD-10-CM | POA: Diagnosis not present

## 2022-04-08 DIAGNOSIS — Z96651 Presence of right artificial knee joint: Secondary | ICD-10-CM | POA: Diagnosis not present

## 2022-04-08 DIAGNOSIS — E7800 Pure hypercholesterolemia, unspecified: Secondary | ICD-10-CM | POA: Diagnosis present

## 2022-04-08 DIAGNOSIS — N183 Chronic kidney disease, stage 3 unspecified: Secondary | ICD-10-CM | POA: Diagnosis present

## 2022-04-08 DIAGNOSIS — Z66 Do not resuscitate: Secondary | ICD-10-CM | POA: Diagnosis present

## 2022-04-08 DIAGNOSIS — E02 Subclinical iodine-deficiency hypothyroidism: Secondary | ICD-10-CM | POA: Diagnosis not present

## 2022-04-08 DIAGNOSIS — J440 Chronic obstructive pulmonary disease with acute lower respiratory infection: Principal | ICD-10-CM | POA: Diagnosis present

## 2022-04-08 DIAGNOSIS — N1832 Chronic kidney disease, stage 3b: Secondary | ICD-10-CM | POA: Diagnosis not present

## 2022-04-08 DIAGNOSIS — E039 Hypothyroidism, unspecified: Secondary | ICD-10-CM | POA: Diagnosis not present

## 2022-04-08 DIAGNOSIS — I10 Essential (primary) hypertension: Secondary | ICD-10-CM | POA: Diagnosis not present

## 2022-04-08 DIAGNOSIS — Z86718 Personal history of other venous thrombosis and embolism: Secondary | ICD-10-CM

## 2022-04-08 DIAGNOSIS — E876 Hypokalemia: Secondary | ICD-10-CM | POA: Diagnosis not present

## 2022-04-08 DIAGNOSIS — I4821 Permanent atrial fibrillation: Secondary | ICD-10-CM | POA: Diagnosis not present

## 2022-04-08 DIAGNOSIS — I5032 Chronic diastolic (congestive) heart failure: Secondary | ICD-10-CM | POA: Diagnosis present

## 2022-04-08 DIAGNOSIS — J209 Acute bronchitis, unspecified: Secondary | ICD-10-CM | POA: Diagnosis not present

## 2022-04-08 DIAGNOSIS — Z7901 Long term (current) use of anticoagulants: Secondary | ICD-10-CM | POA: Diagnosis not present

## 2022-04-08 DIAGNOSIS — R0600 Dyspnea, unspecified: Secondary | ICD-10-CM

## 2022-04-08 DIAGNOSIS — R059 Cough, unspecified: Secondary | ICD-10-CM | POA: Diagnosis not present

## 2022-04-08 DIAGNOSIS — Z823 Family history of stroke: Secondary | ICD-10-CM

## 2022-04-08 DIAGNOSIS — Z7951 Long term (current) use of inhaled steroids: Secondary | ICD-10-CM | POA: Diagnosis not present

## 2022-04-08 DIAGNOSIS — Z803 Family history of malignant neoplasm of breast: Secondary | ICD-10-CM | POA: Diagnosis not present

## 2022-04-08 DIAGNOSIS — Z950 Presence of cardiac pacemaker: Secondary | ICD-10-CM

## 2022-04-08 DIAGNOSIS — Z905 Acquired absence of kidney: Secondary | ICD-10-CM | POA: Diagnosis not present

## 2022-04-08 DIAGNOSIS — Z8249 Family history of ischemic heart disease and other diseases of the circulatory system: Secondary | ICD-10-CM | POA: Diagnosis not present

## 2022-04-08 DIAGNOSIS — J189 Pneumonia, unspecified organism: Secondary | ICD-10-CM | POA: Diagnosis not present

## 2022-04-08 DIAGNOSIS — R0902 Hypoxemia: Secondary | ICD-10-CM | POA: Diagnosis not present

## 2022-04-08 DIAGNOSIS — Z79899 Other long term (current) drug therapy: Secondary | ICD-10-CM

## 2022-04-08 DIAGNOSIS — Z7952 Long term (current) use of systemic steroids: Secondary | ICD-10-CM | POA: Diagnosis not present

## 2022-04-08 DIAGNOSIS — R509 Fever, unspecified: Secondary | ICD-10-CM | POA: Diagnosis not present

## 2022-04-08 DIAGNOSIS — I130 Hypertensive heart and chronic kidney disease with heart failure and stage 1 through stage 4 chronic kidney disease, or unspecified chronic kidney disease: Secondary | ICD-10-CM | POA: Diagnosis present

## 2022-04-08 DIAGNOSIS — J441 Chronic obstructive pulmonary disease with (acute) exacerbation: Secondary | ICD-10-CM | POA: Diagnosis present

## 2022-04-08 DIAGNOSIS — Z1152 Encounter for screening for COVID-19: Secondary | ICD-10-CM | POA: Diagnosis not present

## 2022-04-08 DIAGNOSIS — R0602 Shortness of breath: Secondary | ICD-10-CM | POA: Diagnosis not present

## 2022-04-08 DIAGNOSIS — I959 Hypotension, unspecified: Secondary | ICD-10-CM | POA: Diagnosis not present

## 2022-04-08 DIAGNOSIS — R051 Acute cough: Principal | ICD-10-CM

## 2022-04-08 DIAGNOSIS — R062 Wheezing: Secondary | ICD-10-CM | POA: Diagnosis not present

## 2022-04-08 DIAGNOSIS — Z79890 Hormone replacement therapy (postmenopausal): Secondary | ICD-10-CM

## 2022-04-08 LAB — RESP PANEL BY RT-PCR (RSV, FLU A&B, COVID)  RVPGX2
Influenza A by PCR: NEGATIVE
Influenza B by PCR: NEGATIVE
Resp Syncytial Virus by PCR: NEGATIVE
SARS Coronavirus 2 by RT PCR: NEGATIVE

## 2022-04-08 LAB — RESPIRATORY PANEL BY PCR

## 2022-04-08 LAB — COMPREHENSIVE METABOLIC PANEL
ALT: 15 U/L (ref 0–44)
AST: 22 U/L (ref 15–41)
Albumin: 4 g/dL (ref 3.5–5.0)
Alkaline Phosphatase: 42 U/L (ref 38–126)
Anion gap: 12 (ref 5–15)
BUN: 39 mg/dL — ABNORMAL HIGH (ref 8–23)
CO2: 26 mmol/L (ref 22–32)
Calcium: 8.9 mg/dL (ref 8.9–10.3)
Chloride: 101 mmol/L (ref 98–111)
Creatinine, Ser: 1.43 mg/dL — ABNORMAL HIGH (ref 0.44–1.00)
GFR, Estimated: 34 mL/min — ABNORMAL LOW (ref >60)
Glucose, Bld: 142 mg/dL — ABNORMAL HIGH (ref 70–99)
Potassium: 3 mmol/L — ABNORMAL LOW (ref 3.5–5.1)
Sodium: 139 mmol/L (ref 135–145)
Total Bilirubin: 1.4 mg/dL — ABNORMAL HIGH (ref 0.3–1.2)
Total Protein: 6.2 g/dL — ABNORMAL LOW (ref 6.5–8.1)

## 2022-04-08 LAB — CBC WITH DIFFERENTIAL/PLATELET
Abs Immature Granulocytes: 0.07 10*3/uL (ref 0.00–0.07)
Basophils Absolute: 0 10*3/uL (ref 0.0–0.1)
Basophils Relative: 0 %
Eosinophils Absolute: 0 10*3/uL (ref 0.0–0.5)
Eosinophils Relative: 0 %
HCT: 38.4 % (ref 36.0–46.0)
Hemoglobin: 12 g/dL (ref 12.0–15.0)
Immature Granulocytes: 1 %
Lymphocytes Relative: 22 %
Lymphs Abs: 2.8 10*3/uL (ref 0.7–4.0)
MCH: 31.3 pg (ref 26.0–34.0)
MCHC: 31.3 g/dL (ref 30.0–36.0)
MCV: 100 fL (ref 80.0–100.0)
Monocytes Absolute: 1.3 10*3/uL — ABNORMAL HIGH (ref 0.1–1.0)
Monocytes Relative: 10 %
Neutro Abs: 8.7 10*3/uL — ABNORMAL HIGH (ref 1.7–7.7)
Neutrophils Relative %: 67 %
Platelets: 223 10*3/uL (ref 150–400)
RBC: 3.84 MIL/uL — ABNORMAL LOW (ref 3.87–5.11)
RDW: 15.1 % (ref 11.5–15.5)
WBC: 12.8 10*3/uL — ABNORMAL HIGH (ref 4.0–10.5)
nRBC: 0 % (ref 0.0–0.2)

## 2022-04-08 LAB — BLOOD GAS, VENOUS
Acid-Base Excess: 3.8 mmol/L — ABNORMAL HIGH (ref 0.0–2.0)
Bicarbonate: 29.2 mmol/L — ABNORMAL HIGH (ref 20.0–28.0)
O2 Saturation: 80.2 %
Patient temperature: 37
pCO2, Ven: 46 mmHg (ref 44–60)
pH, Ven: 7.41 (ref 7.25–7.43)
pO2, Ven: 46 mmHg — ABNORMAL HIGH (ref 32–45)

## 2022-04-08 LAB — BRAIN NATRIURETIC PEPTIDE: B Natriuretic Peptide: 558.8 pg/mL — ABNORMAL HIGH (ref 0.0–100.0)

## 2022-04-08 LAB — TROPONIN I (HIGH SENSITIVITY)
Troponin I (High Sensitivity): 34 ng/L — ABNORMAL HIGH (ref <18)
Troponin I (High Sensitivity): 47 ng/L — ABNORMAL HIGH (ref <18)

## 2022-04-08 LAB — LACTIC ACID, PLASMA: Lactic Acid, Venous: 1.8 mmol/L (ref 0.5–1.9)

## 2022-04-08 MED ORDER — METHYLPREDNISOLONE SODIUM SUCC 125 MG IJ SOLR
60.0000 mg | Freq: Two times a day (BID) | INTRAMUSCULAR | Status: AC
  Administered 2022-04-08 – 2022-04-09 (×2): 60 mg via INTRAVENOUS
  Filled 2022-04-08 (×2): qty 2

## 2022-04-08 MED ORDER — ACETAMINOPHEN 500 MG PO TABS
1000.0000 mg | ORAL_TABLET | Freq: Once | ORAL | Status: AC
  Administered 2022-04-08: 1000 mg via ORAL
  Filled 2022-04-08: qty 2

## 2022-04-08 MED ORDER — SODIUM CHLORIDE 0.9 % IV SOLN
2.0000 g | INTRAVENOUS | Status: DC
  Administered 2022-04-09: 2 g via INTRAVENOUS
  Filled 2022-04-08 (×2): qty 20

## 2022-04-08 MED ORDER — POTASSIUM CHLORIDE 10 MEQ/100ML IV SOLN
10.0000 meq | Freq: Once | INTRAVENOUS | Status: AC
  Administered 2022-04-08: 10 meq via INTRAVENOUS
  Filled 2022-04-08: qty 100

## 2022-04-08 MED ORDER — RIVAROXABAN 15 MG PO TABS
15.0000 mg | ORAL_TABLET | Freq: Every day | ORAL | Status: DC
  Administered 2022-04-08 – 2022-04-10 (×3): 15 mg via ORAL
  Filled 2022-04-08 (×3): qty 1

## 2022-04-08 MED ORDER — SODIUM CHLORIDE 0.9 % IV SOLN
500.0000 mg | INTRAVENOUS | Status: DC
  Administered 2022-04-08 – 2022-04-09 (×2): 500 mg via INTRAVENOUS
  Filled 2022-04-08 (×2): qty 5

## 2022-04-08 MED ORDER — LATANOPROST 0.005 % OP SOLN
1.0000 [drp] | Freq: Every day | OPHTHALMIC | Status: DC
  Administered 2022-04-08 – 2022-04-10 (×3): 1 [drp] via OPHTHALMIC
  Filled 2022-04-08: qty 2.5

## 2022-04-08 MED ORDER — ONDANSETRON HCL 4 MG PO TABS: 4.0000 mg | ORAL_TABLET | Freq: Four times a day (QID) | ORAL | Status: DC | PRN

## 2022-04-08 MED ORDER — FLUTICASONE FUROATE-VILANTEROL 200-25 MCG/ACT IN AEPB: 1.0000 | INHALATION_SPRAY | Freq: Every day | RESPIRATORY_TRACT | Status: DC

## 2022-04-08 MED ORDER — IPRATROPIUM-ALBUTEROL 0.5-2.5 (3) MG/3ML IN SOLN
3.0000 mL | Freq: Four times a day (QID) | RESPIRATORY_TRACT | Status: DC
  Administered 2022-04-08 – 2022-04-09 (×4): 3 mL via RESPIRATORY_TRACT
  Filled 2022-04-08: qty 9
  Filled 2022-04-08 (×3): qty 3

## 2022-04-08 MED ORDER — ONDANSETRON HCL 4 MG/2ML IJ SOLN: 4.0000 mg | Freq: Four times a day (QID) | INTRAMUSCULAR | Status: DC | PRN

## 2022-04-08 MED ORDER — PREDNISONE 20 MG PO TABS
40.0000 mg | ORAL_TABLET | Freq: Every day | ORAL | Status: DC
  Administered 2022-04-09 – 2022-04-11 (×3): 40 mg via ORAL
  Filled 2022-04-08 (×3): qty 2

## 2022-04-08 MED ORDER — SODIUM CHLORIDE 0.9 % IV SOLN
1.0000 g | Freq: Once | INTRAVENOUS | Status: AC
  Administered 2022-04-08: 1 g via INTRAVENOUS

## 2022-04-08 MED ORDER — POTASSIUM CHLORIDE ER 10 MEQ PO TBCR
40.0000 meq | EXTENDED_RELEASE_TABLET | Freq: Two times a day (BID) | ORAL | Status: DC
  Administered 2022-04-08 – 2022-04-11 (×6): 40 meq via ORAL
  Filled 2022-04-08 (×11): qty 4

## 2022-04-08 MED ORDER — LEVOTHYROXINE SODIUM 75 MCG PO TABS
75.0000 ug | ORAL_TABLET | Freq: Every day | ORAL | Status: DC
  Administered 2022-04-09 – 2022-04-11 (×3): 75 ug via ORAL
  Filled 2022-04-08 (×3): qty 1

## 2022-04-08 MED ORDER — DORZOLAMIDE HCL 2 % OP SOLN
1.0000 [drp] | Freq: Two times a day (BID) | OPHTHALMIC | Status: DC
  Administered 2022-04-08 – 2022-04-11 (×6): 1 [drp] via OPHTHALMIC
  Filled 2022-04-08: qty 10

## 2022-04-08 MED ORDER — SODIUM CHLORIDE 0.9 % IV SOLN: 500.0000 mg | Freq: Once | INTRAVENOUS | Status: DC

## 2022-04-08 MED ORDER — SODIUM CHLORIDE 0.9 % IV SOLN: 2.0000 g | INTRAVENOUS | Status: DC

## 2022-04-08 MED ORDER — MONTELUKAST SODIUM 10 MG PO TABS
10.0000 mg | ORAL_TABLET | Freq: Every day | ORAL | Status: DC
  Administered 2022-04-08 – 2022-04-11 (×4): 10 mg via ORAL
  Filled 2022-04-08 (×4): qty 1

## 2022-04-08 NOTE — ED Provider Notes (Signed)
[facility] DEPT Provider Note   CSN: [id number] Arrival date & time: 04/08/22  2229     History  Chief Complaint  Patient presents with   Shortness of Breath   Cough    Jody Blake is a 89 y.o. female.  89 year old female with prior medical history as detailed below presents from [facility].  Patient arrives by EMS.  Patient called EMS for shortness of breath.  Patient uses nebulizer 3 times a day at baseline.  Patient reports fever at home.  She reports increased wheezing and cough x 2 days.  Patient was hypoxic with EMS with room air sats at 90%.  Patient was noted to be wheezing in all lung fields per EMS.  Patient was given 10 mg albuterol, 125 mg Solu-Medrol during transport.  The history is provided by the patient and medical records.       Home Medications Prior to Admission medications   Medication Sig Start Date End Date Taking? Authorizing Provider  albuterol (VENTOLIN HFA) 108 (90 Base) MCG/ACT inhaler Inhale 2 puffs into the lungs every 6 (six) hours as needed for wheezing or shortness of breath.    [provider]  Azelastine HCl 137 MCG/SPRAY SOLN USE 1 TO 2 SPRAYS INTO BOTH NOSTRILS TWICE DAILY. 09/24/21   Royal Hawthorn, NP  budesonide-formoterol (SYMBICORT) 160-4.5 MCG/ACT inhaler Inhale 2 puffs into the lungs 2 (two) times daily. 02/26/22   Fargo, Amy E, NP  Calcium 600-10 MG-MCG CHEW Chew 1 capsule by mouth daily. 12/12/21   [name], MD  Cholecalciferol (VITAMIN D) 125 MCG (5000 UT) CAPS Take 1 capsule by mouth daily. 11/28/21   [name], MD  COVID-19 mRNA vaccine [phone] (COMIRNATY) syringe Inject into the muscle. 01/28/22   Carlyle Basques, MD  diclofenac Sodium (VOLTAREN) 1 % GEL Apply 4 g topically 4 (four) times daily. 03/06/22   Deno Etienne, DO  dorzolamide (TRUSOPT) 2 % ophthalmic solution 1 drop 2 (two) times daily.    [provider]  famotidine (PEPCID) 40 MG tablet Take  1 tablet (40 mg total) by mouth at bedtime. 11/28/21   [name], MD  fluticasone (FLONASE) 50 MCG/ACT nasal spray USE 2 SPRAYS EACH NOSTRIL ONCE A DAY AS NEEDED FOR ALLERGIES OR CONGESTION. 01/04/21   Deneise Lever, MD  gabapentin (NEURONTIN) 100 MG capsule Take 1 capsule (100 mg total) by mouth at bedtime. 07/18/21   [name], MD  guaiFENesin (MUCINEX) 600 MG 12 hr tablet Take 1 tablet (600 mg total) by mouth 2 (two) times daily. 09/06/21   Royal Hawthorn, NP  latanoprost (XALATAN) 0.005 % ophthalmic solution Place 1 drop into both eyes at bedtime.    [provider]  levothyroxine (SYNTHROID) 75 MCG tablet Take 1 tablet (75 mcg total) by mouth daily. 08/06/21   Royal Hawthorn, NP  loratadine (CLARITIN) 10 MG tablet Take 1 tablet (10 mg total) by mouth daily. 12/18/20   Royal Hawthorn, NP  losartan (COZAAR) 50 MG tablet Take 1 tablet (50 mg total) by mouth daily. 11/22/21   Royal Hawthorn, NP  Lutein 20 MG CAPS Take 1 capsule (20 mg total) by mouth daily. 07/23/21   [name], MD  Magnesium 250 MG TABS Take 1 tablet by mouth daily.    [provider]  metoprolol tartrate (LOPRESSOR) 50 MG tablet Take 1.5 tablets (75 mg total) by mouth in the morning. 07/24/21   [name], MD  montelukast Laurine Blazer)  10 MG tablet Take 1 tablet (10 mg total) by mouth daily. 07/24/21   [name], MD  Multiple Vitamins-Minerals (PRESERVISION AREDS 2) CAPS Take 1 capsule by mouth in the morning and at bedtime. 07/24/21   [name], MD  potassium chloride (KLOR-CON) 10 MEQ tablet Take 2 tablets (20 mEq total) by mouth daily. 08/06/21   Royal Hawthorn, NP  predniSONE (DELTASONE) 5 MG tablet Take 1 tablet (5 mg total) by mouth daily with breakfast. 07/18/21   [name], MD  Rivaroxaban (XARELTO) 15 MG TABS tablet TAKE 1 TABLET BY MOUTH  DAILY WITH SUPPER 01/02/22   [name], MD  sennosides-docusate sodium (SENOKOT-S) 8.6-50 MG tablet Take 2 tablets by mouth at  bedtime.    [provider]  torsemide (DEMADEX) 20 MG tablet Take 1 tablet (20 mg total) by mouth daily as needed. 3 lb weight gain in 1 day or 5 lb in 1 week or excessive edema 07/02/21   Royal Hawthorn, NP  torsemide (DEMADEX) 20 MG tablet Take 1.5 tablets (30 mg total) by mouth daily. 11/30/21   [name], MD      Allergies    Brimonidine tartrate-timolol, Penicillins, Breo ellipta [fluticasone furoate-vilanterol], and Vancomycin    Review of Systems   Review of Systems  All other systems reviewed and are negative.   Physical Exam Updated Vital Signs BP (!) 114/48 (BP Location: Right Arm)   Pulse 63   Temp (!) 100.4 F (38 C) (Oral)   Resp (!) 28   Ht '5\' 3"'$  (1.6 m)   Wt 59 kg   LMP 04/08/1974   SpO2 97%   BMI 23.03 kg/m  Physical Exam Vitals and nursing note reviewed.  Constitutional:      General: She is not in acute distress.    Appearance: Normal appearance. She is well-developed.  HENT:     Head: Normocephalic and atraumatic.  Eyes:     Conjunctiva/sclera: Conjunctivae normal.     Pupils: Pupils are equal, round, and reactive to light.  Cardiovascular:     Rate and Rhythm: Normal rate and regular rhythm.     Heart sounds: Normal heart sounds.  Pulmonary:     Effort: Pulmonary effort is normal. No respiratory distress.     Comments: Diffuse expiratory wheezes in all lung fields.  Mildly increased work of breathing. Abdominal:     General: There is no distension.     Palpations: Abdomen is soft.     Tenderness: There is no abdominal tenderness.  Musculoskeletal:        General: No deformity. Normal range of motion.     Cervical back: Normal range of motion and neck supple.  Skin:    General: Skin is warm and dry.  Neurological:     General: No focal deficit present.     Mental Status: She is alert and oriented to person, place, and time.     ED Results / Procedures / Treatments   Labs (all labs ordered are listed, but only abnormal  results are displayed) Labs Reviewed  RESP PANEL BY RT-PCR (RSV, FLU A&B, COVID)  [id number]  CULTURE, BLOOD (ROUTINE X 2)  CULTURE, BLOOD (ROUTINE X 2)  CBC WITH DIFFERENTIAL/PLATELET  COMPREHENSIVE METABOLIC PANEL  LACTIC ACID, PLASMA  LACTIC ACID, PLASMA  BLOOD GAS, VENOUS  BRAIN NATRIURETIC PEPTIDE  TROPONIN I (HIGH SENSITIVITY)    EKG EKG Interpretation  Date/Time:  Monday April 08 2022 10:29:17 EST Ventricular Rate:  62  PR Interval:  60 QRS Duration: 162 QT Interval:  446 QTC Calculation: 453 R Axis:   -80 Text Interpretation: VENTRICULAR PACED RHYTHM Confirmed by Dene Gentry [phone]) on 04/08/2022 10:35:07 AM  Radiology No results found.  Procedures Procedures    Medications Ordered in ED Medications  acetaminophen (TYLENOL) tablet 1,000 mg (has no administration in time range)    ED Course/ Medical Decision Making/ A&P                           Medical Decision Making Amount and/or Complexity of Data Reviewed Labs: ordered. Radiology: ordered.  Risk OTC drugs. Prescription drug management.    Medical Screen Complete  This patient presented to the ED with complaint of cough, shortness of breath, wheezing.  This complaint involves an extensive number of treatment options. The initial differential diagnosis includes, but is not limited to, COPD exacerbation, pneumonia, CHF exacerbation, etc.  This presentation is: Acute, Chronic, Self-Limited, Previously Undiagnosed, Uncertain Prognosis, Complicated, Systemic Symptoms, and Threat to Life/Bodily Function  She is presenting with gradually worsening cough and associated wheezing and shortness of breath.  Presentation is consistent with likely COPD exacerbation.  Patient was given albuterol, Solu-Medrol by EMS.    Patient was febrile on arrival with atelectasis versus infiltrate, chest x-ray.  Antibiotics administered here in the ED.  Patient would benefit from admission.  Hospitalist service made  aware of case and will evaluate for same.  Additional history obtained: External records from outside sources obtained and reviewed including prior ED visits and prior Inpatient records.    Lab Tests:  I ordered and personally interpreted labs.  The pertinent results include: CBC, CMP, COVID, flu, RSV, lactic acid, troponin, bnp   Imaging Studies ordered:  I ordered imaging studies including CXR  I independently visualized and interpreted obtained imaging which showed possible retrocardiac atelectasis versus infiltrate I agree with the radiologist interpretation.   Cardiac Monitoring:  The patient was maintained on a cardiac monitor.  I personally viewed and interpreted the cardiac monitor which showed an underlying rhythm of: NSR   Medicines ordered:  I ordered medication including potassium, acetaminophen, antibiotics for hypokalemia, fever, possible infection Reevaluation of the patient after these medicines showed that the patient: improved    Problem List / ED Course:  Fever, shortness of breath, wheezing   Reevaluation:  After the interventions noted above, I reevaluated the patient and found that they have: improved   Disposition:  After consideration of the diagnostic results and the patients response to treatment, I feel that the patent would benefit from admission.          Final Clinical Impression(s) / ED Diagnoses Final diagnoses:  Acute cough  Dyspnea, unspecified type    Rx / DC Orders ED Discharge Orders     None         Valarie Merino, MD 04/08/22 1421

## 2022-04-08 NOTE — ED Triage Notes (Signed)
Pt BIBA from [facility] for [facility] and productive cough since Friday. Uses nebulizer 3x daily at baseline. Febrile at home. Wheezing and rhonchi in all fields. 90% RA improved to 98% on 2L after '10mg'$  albuterol. '125mg'$  solumedrol. 18gaLF.  124/50 HR 70s paced

## 2022-04-08 NOTE — H&P (Signed)
History and Physical    Patient: Jody Blake DOB: 1926-02-12 DOA: 04/08/2022 DOS: the patient was seen and examined on 04/08/2022 PCP: [name], MD  Patient coming from: ALF/ILF  Chief Complaint:  Chief Complaint  Patient presents with   Shortness of Breath   Cough   HPI: Jody Blake is a 89 y.o. female with medical history significant of COPD, complete heart block, history of persistent A-fib on chronic anticoagulation, hypothyroidism, hypertension, diastolic heart failure.  Patient lives at [facility].  The patient has been having increased wheezing and cough over the past 2 days.  Her daughter came to visit her and she was found to be having increasing difficulty breathing.  Her daughter noted that she had a fever there.  The residential nurse was contacted, who evaluated the patient.  EMS was contacted and the patient was transported to the hospital.   She was given a nebulizer treatment and Solu-Medrol 125 mg in transport  Review of Systems: As mentioned in the history of present illness. All other systems reviewed and are negative. Past Medical History:  Diagnosis Date   Anemia    Aortic stenosis    a. Echo 09/06/12 EF 55-60%, no WMA, G2DD, Ao valve sclerosis w/ mod stenosis, LA mildly dilated, PA pressure 54mHg   Asthmatic bronchitis    Complete heart block (HCC)    s/p permanent pacemaker 06/27/1999 (Battery change 06/2007 and 2016).  s/p AV nodal ablation by Dr ARayann Heman2016.   COPD (chronic obstructive pulmonary disease) (HCC)    Dr. YAnnamaria Boots  DDD (degenerative disc disease)    Diastolic dysfunction    a. Echo 09/06/12 EF 55-60%, no WMA, G2DD, Ao valve sclerosis w/ mod stenosis, LA mildly dilated, PA pressure 47mg   Diverticulosis    DVT (deep vein thrombosis) in pregnancy 1954   a. LLE   Hypertension    Hypothyroidism    on medication   Macular degeneration    Dr. KuEliezer Bottom Osteoarthrosis, unspecified whether generalized or  localized, other specified sites    PAF (paroxysmal atrial fibrillation) ([facility]   Stopped flecainide, on amiodarone but still has bouts of A FIB (mostly in mornings).    Pure hypercholesterolemia    Staghorn calculus    Left   Past Surgical History:  Procedure Laterality Date   APPENDECTOMY  ~ 1941   AV NODE ABLATION  05/10/2014   AV NODE ABLATION N/A 05/10/2014   Procedure: AV NODE ABLATION;  Surgeon: JaThompson GrayerMD;  [facility];  Service: Cardiovascular;  Laterality: N/A;   BUNIONECTOMY WITH HAMMERTOE RECONSTRUCTION Bilateral ~ 19[place]03/21/01   Medtronic PM implanted by Dr EdLeonia Reeves CARDIOVERSION N/A 12/02/2013   Procedure: CARDIOVERSION;  Surgeon: PaFay RecordsMD;  [facility]: Cardi[facility];  Laterality: N/A;   CATARACT EXTRACTION W/ INTRAOCULAR LENS  IMPLANT, BILATERAL Bilateral    COLONOSCOPY WITH PROPOFOL N/A 04/22/2013   Procedure: COLONOSCOPY WITH PROPOFOL;  Surgeon: MaGarlan FairMD;  Location: [facility] ENDOSCOPY;  Service: Endoscopy;  Laterality: N/A;   DILATION AND CURETTAGE OF UTERUS  X 2   "when I was going thru menopause"   ESOPHAGOGASTRODUODENOSCOPY (EGD) WITH PROPOFOL N/A 04/22/2013   Procedure: ESOPHAGOGASTRODUODENOSCOPY (EGD) WITH PROPOFOL;  Surgeon: MaGarlan FairMD;  Location: [facility] ENDOSCOPY;  Service: Endoscopy;  Laterality: N/A;   HERNIA REPAIR     INCISIONAL HERNIA REPAIR     INSERT /  REPLACE / REMOVE PACEMAKER  06/2007   "took out the old; put in new"   INSERT / REPLACE / REMOVE PACEMAKER  05/10/2014   MDT PPM generator change by Dr [name]   JOINT REPLACEMENT     PARTIAL NEPHRECTOMY Left 05/1974   stone disease   PERMANENT PACEMAKER GENERATOR CHANGE N/A 05/10/2014   Procedure: PERMANENT PACEMAKER GENERATOR CHANGE;  Surgeon: Thompson Grayer, MD;  Location: [facility] CATH LAB;  Service: Cardiovascular;  Laterality: N/A;   TONSILLECTOMY AND ADENOIDECTOMY  1930's   TOTAL KNEE ARTHROPLASTY Right 2001   Social  History:  reports that she has never smoked. She has never used smokeless tobacco. She reports current alcohol use of about 7.0 standard drinks of alcohol per week. She reports that she does not use drugs.  Allergies  Allergen Reactions   Brimonidine Tartrate-Timolol Other (See Comments)    REACTION: systemic malaise amigen eye drop   Penicillins Hives   Breo Ellipta [Fluticasone Furoate-Vilanterol] Other (See Comments)    Ran blood pressure up Increased blood pressure   Vancomycin Other (See Comments)    Red man syndrome - Mild redness and discomfort. Able to complete initial dose.     Family History  Problem Relation Age of Onset   Malignant hypertension Father    Hypertension Father    Renal Disease Father    Breast cancer Mother    Heart attack Brother    Stroke Brother     Prior to Admission medications   Medication Sig Start Date End Date Taking? Authorizing Provider  albuterol (VENTOLIN HFA) 108 (90 Base) MCG/ACT inhaler Inhale 2 puffs into the lungs every 6 (six) hours as needed for wheezing or shortness of breath.    [provider]  Azelastine HCl 137 MCG/SPRAY SOLN USE 1 TO 2 SPRAYS INTO BOTH NOSTRILS TWICE DAILY. 09/24/21   Royal Hawthorn, NP  budesonide-formoterol (SYMBICORT) 160-4.5 MCG/ACT inhaler Inhale 2 puffs into the lungs 2 (two) times daily. 02/26/22   Fargo, Amy E, NP  Calcium 600-10 MG-MCG CHEW Chew 1 capsule by mouth daily. 12/12/21   [name], MD  Cholecalciferol (VITAMIN D) 125 MCG (5000 UT) CAPS Take 1 capsule by mouth daily. 11/28/21   [name], MD  COVID-19 mRNA vaccine [phone] (COMIRNATY) syringe Inject into the muscle. 01/28/22   Carlyle Basques, MD  diclofenac Sodium (VOLTAREN) 1 % GEL Apply 4 g topically 4 (four) times daily. 03/06/22   Deno Etienne, DO  dorzolamide (TRUSOPT) 2 % ophthalmic solution 1 drop 2 (two) times daily.    [provider]  famotidine (PEPCID) 40 MG tablet Take 1 tablet (40 mg total) by mouth at  bedtime. 11/28/21   [name], MD  fluticasone (FLONASE) 50 MCG/ACT nasal spray USE 2 SPRAYS EACH NOSTRIL ONCE A DAY AS NEEDED FOR ALLERGIES OR CONGESTION. 01/04/21   Deneise Lever, MD  gabapentin (NEURONTIN) 100 MG capsule Take 1 capsule (100 mg total) by mouth at bedtime. 07/18/21   [name], MD  guaiFENesin (MUCINEX) 600 MG 12 hr tablet Take 1 tablet (600 mg total) by mouth 2 (two) times daily. 09/06/21   Royal Hawthorn, NP  latanoprost (XALATAN) 0.005 % ophthalmic solution Place 1 drop into both eyes at bedtime.    [provider]  levothyroxine (SYNTHROID) 75 MCG tablet Take 1 tablet (75 mcg total) by mouth daily. 08/06/21   Royal Hawthorn, NP  loratadine (CLARITIN) 10 MG tablet Take 1 tablet (10 mg total) by mouth daily. 12/18/20  Royal Hawthorn, NP  losartan (COZAAR) 50 MG tablet Take 1 tablet (50 mg total) by mouth daily. 11/22/21   Royal Hawthorn, NP  Lutein 20 MG CAPS Take 1 capsule (20 mg total) by mouth daily. 07/23/21   [name], MD  Magnesium 250 MG TABS Take 1 tablet by mouth daily.    [provider]  metoprolol tartrate (LOPRESSOR) 50 MG tablet Take 1.5 tablets (75 mg total) by mouth in the morning. 07/24/21   [name], MD  montelukast (SINGULAIR) 10 MG tablet Take 1 tablet (10 mg total) by mouth daily. 07/24/21   [name], MD  Multiple Vitamins-Minerals (PRESERVISION AREDS 2) CAPS Take 1 capsule by mouth in the morning and at bedtime. 07/24/21   [name], MD  potassium chloride (KLOR-CON) 10 MEQ tablet Take 2 tablets (20 mEq total) by mouth daily. 08/06/21   Royal Hawthorn, NP  predniSONE (DELTASONE) 5 MG tablet Take 1 tablet (5 mg total) by mouth daily with breakfast. 07/18/21   [name], MD  Rivaroxaban (XARELTO) 15 MG TABS tablet TAKE 1 TABLET BY MOUTH  DAILY WITH SUPPER 01/02/22   [name], MD  sennosides-docusate sodium (SENOKOT-S) 8.6-50 MG tablet Take 2 tablets by mouth at bedtime.    [provider]   torsemide (DEMADEX) 20 MG tablet Take 1 tablet (20 mg total) by mouth daily as needed. 3 lb weight gain in 1 day or 5 lb in 1 week or excessive edema 07/02/21   Royal Hawthorn, NP  torsemide (DEMADEX) 20 MG tablet Take 1.5 tablets (30 mg total) by mouth daily. 11/30/21   [name], MD    Physical Exam: Vitals:   04/08/22 1230 04/08/22 1300 04/08/22 1311 04/08/22 1330  BP: (!) 113/48 96/66  (!) 101/47  Pulse: 60 61  60  Resp: (!) 23 (!) 23  20  Temp:   99.3 F (37.4 C)   TempSrc:   Oral   SpO2: 96% 97%  94%  Weight:      Height:       General: Elderly female. Awake and alert and oriented x3. No acute cardiopulmonary distress.  HEENT: Normocephalic atraumatic.  Right and left ears normal in appearance.  Pupils equal, round, reactive to light. Extraocular muscles are intact. Sclerae anicteric and noninjected.  Moist mucosal membranes. No mucosal lesions.  Neck: Neck supple without lymphadenopathy. No carotid bruits. No masses palpated.  Cardiovascular: Regular rate with normal S1-S2 sounds. No murmurs, rubs, gallops auscultated. No JVD.  Respiratory: Rales in the left lung base with wheezing throughout.  No accessory muscle use. Abdomen: Soft, nontender, nondistended. Active bowel sounds. No masses or hepatosplenomegaly  Skin: No rashes, lesions, or ulcerations.  Dry, warm to touch. 2+ dorsalis pedis and radial pulses. Musculoskeletal: No calf or leg pain. All major joints not erythematous nontender.  No upper or lower joint deformation.  Good ROM.  No contractures  Psychiatric: Intact judgment and insight. Pleasant and cooperative. Neurologic: No focal neurological deficits. Strength is 5/5 and symmetric in upper and lower extremities.  Cranial nerves II through XII are grossly intact.  Data Reviewed: Results for orders placed or performed during the hospital encounter of 04/08/22 (from the past 24 hour(s))  CBC with Differential     Status: Abnormal   Collection Time: 04/08/22  10:45 AM  Result Value Ref Range   WBC 12.8 (H) 4.0 - 10.5 K/uL   RBC 3.84 (L) 3.87 - 5.11 MIL/uL   Hemoglobin 12.0  12.0 - 15.0 g/dL   HCT 38.4 36.0 - 46.0 %   MCV 100.0 80.0 - 100.0 fL   MCH 31.3 26.0 - 34.0 pg   MCHC 31.3 30.0 - 36.0 g/dL   RDW 15.1 11.5 - 15.5 %   Platelets 223 150 - 400 K/uL   nRBC 0.0 0.0 - 0.2 %   Neutrophils Relative % 67 %   Neutro Abs 8.7 (H) 1.7 - 7.7 K/uL   Lymphocytes Relative 22 %   Lymphs Abs 2.8 0.7 - 4.0 K/uL   Monocytes Relative 10 %   Monocytes Absolute 1.3 (H) 0.1 - 1.0 K/uL   Eosinophils Relative 0 %   Eosinophils Absolute 0.0 0.0 - 0.5 K/uL   Basophils Relative 0 %   Basophils Absolute 0.0 0.0 - 0.1 K/uL   Immature Granulocytes 1 %   Abs Immature Granulocytes 0.07 0.00 - 0.07 K/uL  Comprehensive metabolic panel     Status: Abnormal   Collection Time: 04/08/22 10:45 AM  Result Value Ref Range   Sodium 139 135 - 145 mmol/L   Potassium 3.0 (L) 3.5 - 5.1 mmol/L   Chloride 101 98 - 111 mmol/L   CO2 26 22 - 32 mmol/L   Glucose, Bld 142 (H) 70 - 99 mg/dL   BUN 39 (H) 8 - 23 mg/dL   Creatinine, Ser 1.43 (H) 0.44 - 1.00 mg/dL   Calcium 8.9 8.9 - 10.3 mg/dL   Total Protein 6.2 (L) 6.5 - 8.1 g/dL   Albumin 4.0 3.5 - 5.0 g/dL   AST 22 15 - 41 U/L   ALT 15 0 - 44 U/L   Alkaline Phosphatase 42 38 - 126 U/L   Total Bilirubin 1.4 (H) 0.3 - 1.2 mg/dL   GFR, Estimated 34 (L) >60 mL/min   Anion gap 12 5 - 15  Blood gas, venous     Status: Abnormal   Collection Time: 04/08/22 10:45 AM  Result Value Ref Range   pH, Ven 7.41 7.25 - 7.43   pCO2, Ven 46 44 - 60 mmHg   pO2, Ven 46 (H) 32 - 45 mmHg   Bicarbonate 29.2 (H) 20.0 - 28.0 mmol/L   Acid-Base Excess 3.8 (H) 0.0 - 2.0 mmol/L   O2 Saturation 80.2 %   Patient temperature 37.0   Troponin I (High Sensitivity)     Status: Abnormal   Collection Time: 04/08/22 10:45 AM  Result Value Ref Range   Troponin I (High Sensitivity) 34 (H) <18 ng/L  Brain natriuretic peptide     Status: Abnormal    Collection Time: 04/08/22 10:45 AM  Result Value Ref Range   B Natriuretic Peptide 558.8 (H) 0.0 - 100.0 pg/mL  Resp panel by RT-PCR (RSV, Flu A&B, Covid) Anterior Nasal Swab     Status: None   Collection Time: 04/08/22 10:49 AM   Specimen: Anterior Nasal Swab  Result Value Ref Range   SARS Coronavirus 2 by RT PCR NEGATIVE NEGATIVE   Influenza A by PCR NEGATIVE NEGATIVE   Influenza B by PCR NEGATIVE NEGATIVE   Resp Syncytial Virus by PCR NEGATIVE NEGATIVE  Lactic acid, plasma     Status: None   Collection Time: 04/08/22 12:46 PM  Result Value Ref Range   Lactic Acid, Venous 1.8 0.5 - 1.9 mmol/L  Troponin I (High Sensitivity)     Status: Abnormal   Collection Time: 04/08/22 12:46 PM  Result Value Ref Range   Troponin I (High Sensitivity) 47 (H) <18 ng/L  DG Chest Port 1 View  Result Date: 04/08/2022 CLINICAL DATA:  Cough, wheezing EXAM: PORTABLE CHEST 1 VIEW COMPARISON:  11/28/2021 FINDINGS: Cardiomegaly. Left chest multi lead pacer. Probable retrocardiac atelectasis. The visualized skeletal structures are unremarkable. IMPRESSION: Cardiomegaly with probable retrocardiac atelectasis. Electronically Signed   By: Delanna Ahmadi M.D.   On: 04/08/2022 11:25     Assessment and Plan: No notes have been filed under this hospital service. Service: Hospitalist  Principal Problem:   CAP (community acquired pneumonia) Active Problems:   Essential hypertension   Permanent atrial fibrillation (HCC)   GERD   Hypothyroidism   CKD (chronic kidney disease), stage III (HCC)   Chronic diastolic congestive heart failure (HCC)   COPD with acute exacerbation ([place])  Commune acquired pneumonia Antibiotics: rocephin and azithromycin Robitussin Blood cultures drawn in the emergency department Sputum cultures CBC tomorrow Strep and Legionella antigen by urine Influenza screen COVID, Flu, RSV neg COPD with acute exacerbation Antibiotics: As above DuoNeb's every 6 scheduled with albuterol every 2  when necessary Continue inhaled steroids and LA bronchodilator Solu-Medrol 60 mg IV every 12 hours Mucinex Hypertension Continue antihypertensives. Atrial fibrillation Cont xarelto Chronic kidney disease stable Hypokalemia Replace potassium   Advance Care Planning:   Code Status: DNR confirmed by patient  Consults: none  Family Communication: daughter present  Severity of Illness: The appropriate patient status for this patient is INPATIENT. Inpatient status is judged to be reasonable and necessary in order to provide the required intensity of service to ensure the patient's safety. The patient's presenting symptoms, physical exam findings, and initial radiographic and laboratory data in the context of their chronic comorbidities is felt to place them at high risk for further clinical deterioration. Furthermore, it is not anticipated that the patient will be medically stable for discharge from the hospital within 2 midnights of admission.   * I certify that at the point of admission it is my clinical judgment that the patient will require inpatient hospital care spanning beyond 2 midnights from the point of admission due to high intensity of service, high risk for further deterioration and high frequency of surveillance required.*  Author: Truett Mainland, DO 04/08/2022 2:00 PM  For on call review [email or web address].

## 2022-04-09 DIAGNOSIS — I4821 Permanent atrial fibrillation: Secondary | ICD-10-CM

## 2022-04-09 DIAGNOSIS — I10 Essential (primary) hypertension: Secondary | ICD-10-CM

## 2022-04-09 DIAGNOSIS — N183 Chronic kidney disease, stage 3 unspecified: Secondary | ICD-10-CM

## 2022-04-09 DIAGNOSIS — I5032 Chronic diastolic (congestive) heart failure: Secondary | ICD-10-CM

## 2022-04-09 DIAGNOSIS — E876 Hypokalemia: Secondary | ICD-10-CM

## 2022-04-09 DIAGNOSIS — J189 Pneumonia, unspecified organism: Secondary | ICD-10-CM | POA: Diagnosis not present

## 2022-04-09 DIAGNOSIS — J441 Chronic obstructive pulmonary disease with (acute) exacerbation: Secondary | ICD-10-CM

## 2022-04-09 DIAGNOSIS — E02 Subclinical iodine-deficiency hypothyroidism: Secondary | ICD-10-CM

## 2022-04-09 LAB — BASIC METABOLIC PANEL
Anion gap: 11 (ref 5–15)
BUN: 39 mg/dL — ABNORMAL HIGH (ref 8–23)
CO2: 24 mmol/L (ref 22–32)
Calcium: 8.8 mg/dL — ABNORMAL LOW (ref 8.9–10.3)
Chloride: 101 mmol/L (ref 98–111)
Creatinine, Ser: 1.39 mg/dL — ABNORMAL HIGH (ref 0.44–1.00)
GFR, Estimated: 35 mL/min — ABNORMAL LOW (ref >60)
Glucose, Bld: 196 mg/dL — ABNORMAL HIGH (ref 70–99)
Potassium: 3.6 mmol/L (ref 3.5–5.1)
Sodium: 136 mmol/L (ref 135–145)

## 2022-04-09 LAB — CBC
HCT: 36.7 % (ref 36.0–46.0)
Hemoglobin: 11.5 g/dL — ABNORMAL LOW (ref 12.0–15.0)
MCH: 31.4 pg (ref 26.0–34.0)
MCHC: 31.3 g/dL (ref 30.0–36.0)
MCV: 100.3 fL — ABNORMAL HIGH (ref 80.0–100.0)
Platelets: 217 10*3/uL (ref 150–400)
RBC: 3.66 MIL/uL — ABNORMAL LOW (ref 3.87–5.11)
RDW: 15 % (ref 11.5–15.5)
WBC: 9.8 10*3/uL (ref 4.0–10.5)
nRBC: 0 % (ref 0.0–0.2)

## 2022-04-09 MED ORDER — MOMETASONE FURO-FORMOTEROL FUM 200-5 MCG/ACT IN AERO
2.0000 | INHALATION_SPRAY | Freq: Two times a day (BID) | RESPIRATORY_TRACT | Status: DC
  Administered 2022-04-09 – 2022-04-11 (×4): 2 via RESPIRATORY_TRACT
  Filled 2022-04-09: qty 8.8

## 2022-04-09 MED ORDER — IPRATROPIUM-ALBUTEROL 0.5-2.5 (3) MG/3ML IN SOLN
3.0000 mL | Freq: Four times a day (QID) | RESPIRATORY_TRACT | Status: DC | PRN
  Administered 2022-04-09 – 2022-04-11 (×5): 3 mL via RESPIRATORY_TRACT
  Filled 2022-04-09 (×5): qty 3

## 2022-04-09 MED ORDER — LOSARTAN POTASSIUM 50 MG PO TABS
50.0000 mg | ORAL_TABLET | Freq: Every day | ORAL | Status: DC
  Administered 2022-04-10 – 2022-04-11 (×2): 50 mg via ORAL
  Filled 2022-04-09 (×2): qty 1

## 2022-04-09 MED ORDER — MAGNESIUM 500 MG PO TABS: 750.0000 mg | ORAL_TABLET | ORAL | Status: DC

## 2022-04-09 MED ORDER — TORSEMIDE 5 MG PO TABS
10.0000 mg | ORAL_TABLET | Freq: Every day | ORAL | Status: DC
  Administered 2022-04-09 – 2022-04-11 (×3): 10 mg via ORAL
  Filled 2022-04-09 (×3): qty 2

## 2022-04-09 MED ORDER — METOPROLOL TARTRATE 50 MG PO TABS
75.0000 mg | ORAL_TABLET | Freq: Every morning | ORAL | Status: DC
  Administered 2022-04-10 – 2022-04-11 (×2): 75 mg via ORAL
  Filled 2022-04-09 (×3): qty 1

## 2022-04-09 MED ORDER — GUAIFENESIN ER 600 MG PO TB12
600.0000 mg | ORAL_TABLET | Freq: Two times a day (BID) | ORAL | Status: DC
  Administered 2022-04-09 – 2022-04-11 (×4): 600 mg via ORAL
  Filled 2022-04-09 (×4): qty 1

## 2022-04-09 MED ORDER — FLUTICASONE PROPIONATE 50 MCG/ACT NA SUSP: 2.0000 | Freq: Every day | NASAL | Status: DC | PRN

## 2022-04-09 MED ORDER — SENNOSIDES-DOCUSATE SODIUM 8.6-50 MG PO TABS
2.0000 | ORAL_TABLET | Freq: Every day | ORAL | Status: DC
  Administered 2022-04-09 – 2022-04-10 (×2): 2 via ORAL
  Filled 2022-04-09 (×2): qty 2

## 2022-04-09 MED ORDER — GABAPENTIN 100 MG PO CAPS
100.0000 mg | ORAL_CAPSULE | Freq: Every day | ORAL | Status: DC
  Administered 2022-04-09 – 2022-04-10 (×2): 100 mg via ORAL
  Filled 2022-04-09 (×2): qty 1

## 2022-04-09 MED ORDER — TORSEMIDE 20 MG PO TABS
30.0000 mg | ORAL_TABLET | Freq: Every day | ORAL | Status: DC
  Filled 2022-04-09: qty 1

## 2022-04-09 MED ORDER — LORATADINE 10 MG PO TABS
10.0000 mg | ORAL_TABLET | Freq: Every day | ORAL | Status: DC
  Administered 2022-04-09 – 2022-04-11 (×3): 10 mg via ORAL
  Filled 2022-04-09 (×3): qty 1

## 2022-04-09 MED ORDER — MAGNESIUM OXIDE -MG SUPPLEMENT 400 (240 MG) MG PO TABS
800.0000 mg | ORAL_TABLET | Freq: Every day | ORAL | Status: DC
  Administered 2022-04-09 – 2022-04-10 (×2): 800 mg via ORAL
  Filled 2022-04-09: qty 2

## 2022-04-09 MED ORDER — FAMOTIDINE 20 MG PO TABS
40.0000 mg | ORAL_TABLET | Freq: Every day | ORAL | Status: DC
  Administered 2022-04-09: 40 mg via ORAL
  Filled 2022-04-09: qty 2

## 2022-04-09 MED ORDER — TORSEMIDE 20 MG PO TABS
20.0000 mg | ORAL_TABLET | Freq: Every day | ORAL | Status: DC
  Administered 2022-04-09 – 2022-04-11 (×3): 20 mg via ORAL
  Filled 2022-04-09 (×3): qty 1

## 2022-04-09 NOTE — Progress Notes (Signed)
PROGRESS NOTE    Jody Blake  OHY:[id number] DOB: 1925/10/24 DOA: 04/08/2022 PCP: [name], MD    Brief Narrative:  89 year old with history of COPD and chronic bronchitis, complete heart block status post pacemaker, chronic A-fib on anticoagulation, hypothyroidism, hypertension from independent living facility with caretaker support admitted with about 2 days of increasing wheezing and cough.  In the emergency room chest x-ray with retrocardiac opacities.  COVID-19 and influenza negative.  Admitted due to significant wheezing and exacerbation of bronchitis.   Assessment & Plan:   Chronic bronchitis with acute exacerbation: Community-acquired pneumonia. Agree with admission to monitored unit because of severity of symptoms. Aggressive bronchodilator therapy, IV steroids to oral steroids, inhalational steroids, scheduled and as needed bronchodilators, deep breathing exercises, incentive spirometry, chest physiotherapy and respiratory therapy consult. Blood cultures negative so far.  Sputum culture, Legionella and streptococcal antigen pending. Started on Rocephin azithromycin that we will continue. Supplemental oxygen to keep saturations more than 92%.  Hypokalemia: Replace.  Chronic medical issues including Essential hypertension, stable on home medications. Persistent A-fib, rate controlled on Xarelto.  Has pacemaker. CKD stage IIIb, creatinine at about baseline of 1.4-1.5.  Mobilize with PT OT.    DVT prophylaxis:  Rivaroxaban (XARELTO) tablet 15 mg   Code Status: DNR Family Communication: Caretaker at the bedside Disposition Plan: Status is: Inpatient Remains inpatient appropriate because: Oxygen need, IV antibiotics     Consultants:  None  Procedures:  None  Antimicrobials:  Rocephin azithromycin 1/1----   Subjective: Patient seen and examined.  Has a dry cough and some wheezing.  Wondering about going home.  On minimal oxygen.  She has prolonged dry  nagging cough on deep breathing exercises.  Remains afebrile.  Caretaker at the bedside.  Objective: Vitals:   04/09/22 0201 04/09/22 0639 04/09/22 0854 04/09/22 1030  BP:  123/67  (!) 133/58  Pulse:  62  62  Resp:  16  18  Temp:  98.6 F (37 C)  98.2 F (36.8 C)  TempSrc:  Oral  Oral  SpO2: 95% 98% 95% 97%  Weight:      Height:        Intake/Output Summary (Last 24 hours) at 04/09/2022 1034 Last data filed at 04/09/2022 1031 Gross per 24 hour  Intake 999.86 ml  Output 0 ml  Net 999.86 ml   Filed Weights   04/08/22 1025  Weight: 59 kg    Examination:  General exam: Appears calm.  Uncomfortable with dry nagging cough on deep breathing. SpO2: 97 % O2 Flow Rate (L/min): 2 L/min  Respiratory system: Both expiratory and expiratory wheezes.  Mild distress with unrelenting cough.  On 2 L oxygen. Cardiovascular system: S1 & S2 heard, RRR.  Pacemaker left precordium. No pedal edema. Gastrointestinal system: Abdomen is nondistended, soft and nontender. No organomegaly or masses felt. Normal bowel sounds heard. Central nervous system: Alert and oriented. No focal neurological deficits. Appropriate for age.  Moves all extremities. Extremities: Symmetric 5 x 5 power. Skin: No rashes, lesions or ulcers Psychiatry: Judgement and insight appear normal. Mood & affect appropriate.     Data Reviewed: I have personally reviewed following labs and imaging studies  CBC: Recent Labs  Lab 04/08/22 1045 04/09/22 0501  WBC 12.8* 9.8  NEUTROABS 8.7*  --   HGB 12.0 11.5*  HCT 38.4 36.7  MCV 100.0 100.3*  PLT 223 948   Basic Metabolic Panel: Recent Labs  Lab 04/08/22 1045 04/09/22 0501  NA 139 136  K 3.0*  3.6  CL 101 101  CO2 26 24  GLUCOSE 142* 196*  BUN 39* 39*  CREATININE 1.43* 1.39*  CALCIUM 8.9 8.8*   GFR: Estimated Creatinine Clearance: 20.5 mL/min (A) (by C-G formula based on SCr of 1.39 mg/dL (H)). Liver Function Tests: Recent Labs  Lab 04/08/22 1045  AST 22   ALT 15  ALKPHOS 42  BILITOT 1.4*  PROT 6.2*  ALBUMIN 4.0   No results for input(s): "LIPASE", "AMYLASE" in the last 168 hours. No results for input(s): "AMMONIA" in the last 168 hours. Coagulation Profile: No results for input(s): "INR", "PROTIME" in the last 168 hours. Cardiac Enzymes: No results for input(s): "CKTOTAL", "CKMB", "CKMBINDEX", "TROPONINI" in the last 168 hours. BNP (last 3 results) No results for input(s): "PROBNP" in the last 8760 hours. HbA1C: No results for input(s): "HGBA1C" in the last 72 hours. CBG: No results for input(s): "GLUCAP" in the last 168 hours. Lipid Profile: No results for input(s): "CHOL", "HDL", "LDLCALC", "TRIG", "CHOLHDL", "LDLDIRECT" in the last 72 hours. Thyroid Function Tests: No results for input(s): "TSH", "T4TOTAL", "FREET4", "T3FREE", "THYROIDAB" in the last 72 hours. Anemia Panel: No results for input(s): "VITAMINB12", "FOLATE", "FERRITIN", "TIBC", "IRON", "RETICCTPCT" in the last 72 hours. Sepsis Labs: Recent Labs  Lab 04/08/22 1246  LATICACIDVEN 1.8    Recent Results (from the past 240 hour(s))  Culture, blood (routine x 2)     Status: None (Preliminary result)   Collection Time: 04/08/22 10:45 AM   Specimen: BLOOD RIGHT FOREARM  Result Value Ref Range Status   Specimen Description   Final    BLOOD RIGHT FOREARM BOTTLES DRAWN AEROBIC AND ANAEROBIC Performed at [facility], [place] [address], [place], [place] [address]    Special Requests   Final    Blood Culture results may not be optimal due to an excessive volume of blood received in culture bottles Performed at [facility] [address], [place], [place] [address]    Culture   Final    NO GROWTH < 24 HOURS Performed at [facility] [address] [place], [place] [address]    Report Status PENDING  Incomplete  Resp panel by RT-PCR (RSV, Flu A&B, Covid) Anterior Nasal Swab     Status: None   Collection Time: 04/08/22  10:49 AM   Specimen: Anterior Nasal Swab  Result Value Ref Range Status   SARS Coronavirus 2 by RT PCR NEGATIVE NEGATIVE Final    Comment: (NOTE) SARS-CoV-2 target nucleic acids are NOT DETECTED.  The SARS-CoV-2 RNA is generally detectable in upper respiratory specimens during the acute phase of infection. The lowest concentration of SARS-CoV-2 viral copies this assay can detect is 138 copies/mL. A negative result does not preclude SARS-Cov-2 infection and should not be used as the sole basis for treatment or other patient management decisions. A negative result may occur with  improper specimen collection/handling, submission of specimen other than nasopharyngeal swab, presence of viral mutation(s) within the areas targeted by this assay, and inadequate number of viral copies(<138 copies/mL). A negative result must be combined with clinical observations, patient history, and epidemiological information. The expected result is Negative.  Fact Sheet for Patients:  EntrepreneurPulse.com.au  Fact Sheet for Healthcare Providers:  IncredibleEmployment.be  This test is no t yet approved or cleared by the [place] FDA and  has been authorized for detection and/or diagnosis of SARS-CoV-2 by FDA under an Emergency Use Authorization (EUA). This EUA will remain  in effect (meaning  this test can be used) for the duration of the COVID-19 declaration under Section 564(b)(1) of the Act, 21 U.S.C.section 360bbb-3(b)(1), unless the authorization is terminated  or revoked sooner.       Influenza A by PCR NEGATIVE NEGATIVE Final   Influenza B by PCR NEGATIVE NEGATIVE Final    Comment: (NOTE) The Xpert Xpress SARS-CoV-2/FLU/RSV plus assay is intended as an aid in the diagnosis of influenza from Nasopharyngeal swab specimens and should not be used as a sole basis for treatment. Nasal washings and aspirates are unacceptable for Xpert Xpress  SARS-CoV-2/FLU/RSV testing.  Fact Sheet for Patients: EntrepreneurPulse.com.au  Fact Sheet for Healthcare Providers: IncredibleEmployment.be  This test is not yet approved or cleared by the [place] FDA and has been authorized for detection and/or diagnosis of SARS-CoV-2 by FDA under an Emergency Use Authorization (EUA). This EUA will remain in effect (meaning this test can be used) for the duration of the COVID-19 declaration under Section 564(b)(1) of the Act, 21 U.S.C. section 360bbb-3(b)(1), unless the authorization is terminated or revoked.     Resp Syncytial Virus by PCR NEGATIVE NEGATIVE Final    Comment: (NOTE) Fact Sheet for Patients: EntrepreneurPulse.com.au  Fact Sheet for Healthcare Providers: IncredibleEmployment.be  This test is not yet approved or cleared by the [place] FDA and has been authorized for detection and/or diagnosis of SARS-CoV-2 by FDA under an Emergency Use Authorization (EUA). This EUA will remain in effect (meaning this test can be used) for the duration of the COVID-19 declaration under Section 564(b)(1) of the Act, 21 U.S.C. section 360bbb-3(b)(1), unless the authorization is terminated or revoked.  Performed at [facility], [place] [address] [place], [place] [address]   Culture, blood (routine x 2)     Status: None (Preliminary result)   Collection Time: 04/08/22 10:49 AM   Specimen: BLOOD LEFT FOREARM  Result Value Ref Range Status   Specimen Description   Final    BLOOD LEFT FOREARM BOTTLES DRAWN AEROBIC AND ANAEROBIC Performed at [facility] [address], [place], [place] [address]    Special Requests   Final    Blood Culture adequate volume Performed at [facility] [address], [place], [place] [address]    Culture   Final    NO GROWTH < 24 HOURS Performed at [facility] [address], [place], [place] [address]    Report Status PENDING  Incomplete  Respiratory (~20 pathogens) panel by PCR     Status: Abnormal   Collection Time: 04/08/22 10:49 AM   Specimen: Nasopharyngeal Swab; Respiratory  Result Value Ref Range Status   Adenovirus NOT DETECTED NOT DETECTED Final   Coronavirus 229E NOT DETECTED NOT DETECTED Final    Comment: (NOTE) The Coronavirus on the Respiratory Panel, DOES NOT test for the novel  Coronavirus (2019 nCoV)    Coronavirus HKU1 NOT DETECTED NOT DETECTED Final   Coronavirus NL63 NOT DETECTED NOT DETECTED Final   Coronavirus OC43 DETECTED (A) NOT DETECTED Final   Metapneumovirus NOT DETECTED NOT DETECTED Final   Rhinovirus / Enterovirus NOT DETECTED NOT DETECTED Final   Influenza A NOT DETECTED NOT DETECTED Final   Influenza B NOT DETECTED NOT DETECTED Final   Parainfluenza Virus 1 NOT DETECTED NOT DETECTED Final   Parainfluenza Virus 2 NOT DETECTED NOT DETECTED Final   Parainfluenza Virus 3 NOT DETECTED NOT DETECTED Final   Parainfluenza Virus 4 NOT DETECTED NOT DETECTED Final   Respiratory Syncytial  Virus NOT DETECTED NOT DETECTED Final   Bordetella pertussis NOT DETECTED NOT DETECTED Final   Bordetella Parapertussis NOT DETECTED NOT DETECTED Final   Chlamydophila pneumoniae NOT DETECTED NOT DETECTED Final   Mycoplasma pneumoniae NOT DETECTED NOT DETECTED Final    Comment: Performed at [facility], [place] [address], [place], [place] [address]         Radiology Studies: DG Chest Port 1 View  Result Date: 04/08/2022 CLINICAL DATA:  Cough, wheezing EXAM: PORTABLE CHEST 1 VIEW COMPARISON:  11/28/2021 FINDINGS: Cardiomegaly. Left chest multi lead pacer. Probable retrocardiac atelectasis. The visualized skeletal structures are unremarkable. IMPRESSION: Cardiomegaly with probable retrocardiac atelectasis. Electronically Signed   By: Delanna Ahmadi M.D.   On: 04/08/2022 11:25        Scheduled Meds:  dorzolamide  1 drop Both  Eyes BID   latanoprost  1 drop Both Eyes QHS   levothyroxine  75 mcg Oral Q0600   montelukast  10 mg Oral Daily   potassium chloride  40 mEq Oral BID   predniSONE  40 mg Oral Q breakfast   Rivaroxaban  15 mg Oral Q supper   torsemide  20 mg Oral Daily   And   torsemide  10 mg Oral Daily   Continuous Infusions:  azithromycin (ZITHROMAX) 500 mg in sodium chloride 0.9 % 250 mL IVPB     azithromycin 500 mg (04/08/22 1738)   cefTRIAXone (ROCEPHIN)  IV       LOS: 1 day    Time spent: 35 minutes    Barb Merino, MD [facility] Pager [phone]

## 2022-04-09 NOTE — Evaluation (Signed)
Physical Therapy Evaluation-1x Patient Details Name: DIMONIQUE BOURDEAU MRN: [id number] DOB: 1925/10/31 Today's Date: 04/09/2022  History of Present Illness  89 yo female admitted with Pna. Hx of COVID, CHF, chronic bronchitis, Afib, pacemaker, DVT, DDD, chronic anticoagulation  Clinical Impression  On eval, pt was Mod Ind with mobility. She walked ~250 feet with her rollator. Audible wheezing and dyspnea with activity. O2 95% on RA during session. Pt participated well. Will defer further mobility to nursing and/or mobility team. 1x eval. Will sign off.        Recommendations for follow up therapy are one component of a multi-disciplinary discharge planning process, led by the attending physician.  Recommendations may be updated based on patient status, additional functional criteria and insurance authorization.  Follow Up Recommendations No PT follow up      Assistance Recommended at Discharge PRN  Patient can return home with the following  Direct supervision/assist for financial management;Assistance with cooking/housework;Assist for transportation;Help with stairs or ramp for entrance    Equipment Recommendations None recommended by PT  Recommendations for Other Services       Functional Status Assessment Patient has had a recent decline in their functional status and demonstrates the ability to make significant improvements in function in a reasonable and predictable amount of time.     Precautions / Restrictions Precautions Precautions: Fall Restrictions Weight Bearing Restrictions: No      Mobility  Bed Mobility Overal bed mobility: Modified Independent                  Transfers Overall transfer level: Modified independent                      Ambulation/Gait Ambulation/Gait assistance: Modified independent (Device/Increase time) Gait Distance (Feet): 250 Feet Assistive device: Rollator (4 wheels) Gait Pattern/deviations: Step-through pattern,  Decreased stride length       General Gait Details: Mod ind. no lob with use of rollator. O2 95% on RA. Audible wheezing, dyspnea 2/4  Stairs            Wheelchair Mobility    Modified Rankin (Stroke Patients Only)       Balance Overall balance assessment: Mild deficits observed, not formally tested                                           Pertinent Vitals/Pain Pain Assessment Pain Assessment: No/denies pain    Home Living Family/patient expects to be discharged to:: Private residence Living Arrangements: Alone   Type of Home: Independent living facility Home Access: Level entry       Home Layout: One level Home Equipment: Conservation officer, nature (2 wheels);Rollator (4 wheels)      Prior Function Prior Level of Function : Needs assist             Mobility Comments: mod ind wih rollator. walks to dining room or dinner ADLs Comments: hired help for light housekeeping     Hand Dominance        Extremity/Trunk Assessment   Upper Extremity Assessment Upper Extremity Assessment: Defer to OT evaluation    Lower Extremity Assessment Lower Extremity Assessment: Generalized weakness    Cervical / Trunk Assessment Cervical / Trunk Assessment: Normal  Communication   Communication: HOH  Cognition Arousal/Alertness: Awake/alert Behavior During Therapy: WFL for tasks assessed/performed Overall Cognitive Status: Within Functional Limits for tasks  assessed                                          General Comments      Exercises     Assessment/Plan    PT Assessment Patient does not need any further PT services  PT Problem List         PT Treatment Interventions      PT Goals (Current goals can be found in the Care Plan section)  Acute Rehab PT Goals Patient Stated Goal: home soon PT Goal Formulation: All assessment and education complete, DC therapy    Frequency       Co-evaluation                AM-PAC PT "6 Clicks" Mobility  Outcome Measure Help needed turning from your back to your side while in a flat bed without using bedrails?: None Help needed moving from lying on your back to sitting on the side of a flat bed without using bedrails?: None Help needed moving to and from a bed to a chair (including a wheelchair)?: None Help needed standing up from a chair using your arms (e.g., wheelchair or bedside chair)?: None Help needed to walk in hospital room?: None Help needed climbing 3-5 steps with a railing? : A Little 6 Click Score: 23    End of Session Equipment Utilized During Treatment: Gait belt Activity Tolerance: Patient tolerated treatment well Patient left: in bed;with call bell/phone within reach;with bed alarm set        Time: 4970-2637 PT Time Calculation (min) (ACUTE ONLY): 18 min   Charges:   PT Evaluation $PT Eval Low Complexity: [place], PT Acute Rehabilitation  Office: [phone]

## 2022-04-10 ENCOUNTER — Encounter (HOSPITAL_COMMUNITY): Payer: Self-pay | Admitting: Family Medicine

## 2022-04-10 DIAGNOSIS — I10 Essential (primary) hypertension: Secondary | ICD-10-CM | POA: Diagnosis not present

## 2022-04-10 DIAGNOSIS — J189 Pneumonia, unspecified organism: Secondary | ICD-10-CM | POA: Diagnosis not present

## 2022-04-10 DIAGNOSIS — N183 Chronic kidney disease, stage 3 unspecified: Secondary | ICD-10-CM | POA: Diagnosis not present

## 2022-04-10 DIAGNOSIS — I5032 Chronic diastolic (congestive) heart failure: Secondary | ICD-10-CM | POA: Diagnosis not present

## 2022-04-10 MED ORDER — AZITHROMYCIN 250 MG PO TABS
250.0000 mg | ORAL_TABLET | Freq: Every day | ORAL | Status: DC
  Administered 2022-04-10 – 2022-04-11 (×2): 250 mg via ORAL
  Filled 2022-04-10 (×3): qty 1

## 2022-04-10 MED ORDER — FAMOTIDINE 20 MG PO TABS
10.0000 mg | ORAL_TABLET | Freq: Every day | ORAL | Status: DC
  Administered 2022-04-10: 10 mg via ORAL
  Filled 2022-04-10: qty 1

## 2022-04-10 NOTE — Evaluation (Signed)
Occupational Therapy Evaluation Patient Details Name: Jody Blake MRN: [id number] DOB: 22-Feb-1926 Today's Date: 04/10/2022   History of Present Illness  Jody Blake is a 89 yr old female admitted to the hospital with cough, wheezing, and shortness of breath. She was found to have PNA. PMH: COPD, heart block, a fib, HTN, diastolic heart failure, DVT, DDD   Clinical Impression   Pt performed most tasks with supervision, including lower body dressing seated, sit to stand using a rollator, and toileting at bathroom level. She was noted to be with slight deconditioning and intermittent wheezing during activity. Overall, she is presenting slightly below her baseline level of functioning for self-care management. She will benefit from further OT services during her hospital stay to maximize her independence with ADLs & to decrease the risk for further deconditioning. She reported a desire to go to short-term rehab within her care facility, once discharged from the hospital.       Recommendations for follow up therapy are one component of a multi-disciplinary discharge planning process, led by the attending physician.  Recommendations may be updated based on patient status, additional functional criteria and insurance authorization.   Follow Up Recommendations  Home health OT vs. No follow-up therapy, pending functional progress    Assistance Recommended at Discharge PRN  Patient can return home with the following Assistance with cooking/housework;Assist for transportation    Functional Status Assessment  Patient has had a recent decline in their functional status and demonstrates the ability to make significant improvements in function in a reasonable and predictable amount of time.  Equipment Recommendations  None recommended by OT    \   Precautions / Restrictions Precautions Precautions: Fall Restrictions Weight Bearing Restrictions: No      Mobility Bed Mobility        General  bed mobility comments: Pt was received seated in the bedside chair    Transfers Overall transfer level: Needs assistance Equipment used: Rollator (4 wheels) Transfers: Sit to/from Stand Sit to Stand: Supervision                  Balance Overall balance assessment: Mild deficits observed, not formally tested           ADL either performed or assessed with clinical judgement   ADL Overall ADL's : Needs assistance/impaired Eating/Feeding: Independent Eating/Feeding Details (indicate cue type and reason): based on clinical judgement Grooming: Sitting;Set up           Upper Body Dressing : Min guard;Standing Upper Body Dressing Details (indicate cue type and reason): She donned a robe in standing Lower Body Dressing: Supervision/safety Lower Body Dressing Details (indicate cue type and reason): for sock management in sitting Toilet Transfer: Supervision/safety;Ambulation;Rollator (4 wheels);Grab bars Toilet Transfer Details (indicate cue type and reason): performed at bathroom level Toileting- Clothing Manipulation and Hygiene: Supervision/safety;Sit to/from stand Toileting - Clothing Manipulation Details (indicate cue type and reason): performed at bathroom level             Vision   Additional Comments: pt reported unspecified vision impairment at baseline            Pertinent Vitals/Pain Pain Assessment Pain Assessment: No/denies pain     Hand Dominance Right   Extremity/Trunk Assessment Upper Extremity Assessment Upper Extremity Assessment: RUE deficits/detail;LUE deficits/detail RUE Deficits / Details:  (Chronic shoulder AROM limitations, otherwise elbow and hand AROM WFL. Functional strength) LUE Deficits / Details:  (AROM and strength WFL)   Lower Extremity Assessment Lower Extremity Assessment:  Overall WFL for tasks assessed       Communication Communication Communication: HOH   Cognition Arousal/Alertness: Awake/alert Behavior During  Therapy: WFL for tasks assessed/performed Overall Cognitive Status: Within Functional Limits for tasks assessed          General Comments: Oriented x4, able to follow commands, friendly                Home Living   Living Arrangements: Alone   Type of Home: Independent living facility Home Access: Level entry;Elevator     Home Layout: One level        Home Equipment: Rollator (4 wheels)          Prior Functioning/Environment    Mobility Comments:  (Modified independent for ambulation using a RW) ADLs Comments:  (Modified independent to independent with ADLs, she does not drive, meals and cleaning are provided at the facility, though she normally fixes her own breakfast)        OT Problem List: Decreased strength;Decreased activity tolerance;Impaired balance (sitting and/or standing)      OT Treatment/Interventions: Self-care/ADL training;Therapeutic exercise;Therapeutic activities;Energy conservation;Patient/family education;DME and/or AE instruction;Balance training    OT Goals(Current goals can be found in the care plan section) Acute Rehab OT Goals Patient Stated Goal: to go to short-term rehab within her care community, once discharged from the hospital OT Goal Formulation: With patient Time For Goal Achievement: 04/24/22 Potential to Achieve Goals: Good ADL Goals Pt Will Perform Grooming: with modified independence;standing Pt Will Perform Lower Body Dressing: with modified independence;sit to/from stand Pt Will Transfer to Toilet: with modified independence;ambulating Pt Will Perform Toileting - Clothing Manipulation and hygiene: with modified independence;sit to/from stand Pt/caregiver will Perform Home Exercise Program: Increased strength;Both right and left upper extremity;With theraband;With Supervision  OT Frequency: Min 2X/week       AM-PAC OT "6 Clicks" Daily Activity     Outcome Measure Help from another person eating meals?: None Help from  another person taking care of personal grooming?: None Help from another person toileting, which includes using toliet, bedpan, or urinal?: A Little Help from another person bathing (including washing, rinsing, drying)?: A Little Help from another person to put on and taking off regular upper body clothing?: None Help from another person to put on and taking off regular lower body clothing?: None 6 Click Score: 22   End of Session Equipment Utilized During Treatment: Rollator (4 wheels) Nurse Communication: Mobility status  Activity Tolerance: Patient tolerated treatment well Patient left: in chair;with call bell/phone within reach  OT Visit Diagnosis: Muscle weakness (generalized) (M62.81)                Time: 5859-2924 OT Time Calculation (min): 18 min Charges:  OT General Charges $OT Visit: 1 Visit OT Evaluation $OT Eval Low Complexity: 1 Low    Savien Mamula L Aleyssa Pike, OTR/L 04/10/2022, 1:31 PM

## 2022-04-10 NOTE — [place] FL2 (Signed)
[place] MEDICAID FL2 LEVEL OF CARE FORM     IDENTIFICATION  Patient Name: Jody Blake Birthdate: [birth year] Sex: female Admission Date (Current Location): 04/08/2022  [facility] and [place] Number:  Herbalist and Address:  [facility],  [place], [place]      Provider Number: [id number]  Attending Physician Name and Address:  Barb Merino, MD  Relative Name and Phone Number:  Arther Abbott (daughter) Ph: [phone]    Current Level of Care: Hospital Recommended Level of Care: [place] Prior Approval Number:    Date Approved/Denied:   PASRR Number: [id number]  Discharge Plan: SNF    Current Diagnoses: Patient Active Problem List   Diagnosis Date Noted   COPD with acute exacerbation ([place]) 04/08/2022   Hypokalemia 08/65/7846   Acute diastolic CHF (congestive heart failure) ([place]) 04/12/2021   Acute on chronic diastolic CHF (congestive heart failure) ([place]) 04/11/2021   CAP (community acquired pneumonia) 10/27/2019   Plantar fasciitis of right foot 07/07/2019   Acute embolism and thrombosis of deep vein of lower extremity ([place]) 03/18/2018   Osteoarthritis of right glenohumeral joint 08/22/2017   Macular pucker, right eye 07/08/2017   Primary open angle glaucoma of both eyes, mild stage 07/08/2017   TIA (transient ischemic attack) 06/24/2017   Hypercoagulable state due to atrial fibrillation ([place]) 03/05/2017   Ataxia 11/14/2016   DVT (deep vein thrombosis) in pregnancy 11/14/2016   CKD (chronic kidney disease), stage III ([place]) 11/14/2016   Chronic diastolic congestive heart failure ([place]) 11/14/2016   Hypertensive urgency 11/14/2016   Hypothyroidism 11/08/2015   Age-related macular degeneration, dry, both eyes 09/05/2015   Bilateral ocular hypertension 09/05/2015   Pseudophakia of both eyes 09/05/2015   Pleural effusion on left 06/17/2015   Complete heart block (HCC) 06/22/2014   Sick sinus syndrome ([place])  04/29/2014   Tachycardia-bradycardia syndrome ([place]) 01/06/2013   Cardiac pacemaker in situ 09/07/2012   Long term (current) use of anticoagulants 09/07/2012   Orthostatic hypotension 09/07/2012   Syncope 09/06/2012   Choroidal nevus of left eye 07/26/2011   COPD mixed type ([place]) 04/26/2010   Seasonal and perennial allergic rhinitis 10/27/2008   Essential hypertension 04/19/2008   Permanent atrial fibrillation ([place]) 04/19/2008   GERD 10/29/2007   Bronchitis, chronic obstructive, with exacerbation ([place]) 04/06/2007   APPENDECTOMY, HX OF 04/06/2007    Orientation RESPIRATION BLADDER Height & Weight     Self, Time, Situation, Place  Normal Continent Weight: 130 lb (59 kg) Height:  '5\' 3"'$  (160 cm)  BEHAVIORAL SYMPTOMS/MOOD NEUROLOGICAL BOWEL NUTRITION STATUS   (N/A)  (N/A) Continent Diet (Heart healthy diet)  AMBULATORY STATUS COMMUNICATION OF NEEDS Skin   Limited Assist Verbally Other (Comment), Skin abrasions (Abrasion: left leg; Ecchymosis: bilateral arms & legs)                       Personal Care Assistance Level of Assistance  Bathing, Feeding, Dressing Bathing Assistance: Limited assistance Feeding assistance: Independent Dressing Assistance: Limited assistance     Functional Limitations Info  Sight, Hearing, Speech Sight Info: Impaired Hearing Info: Impaired Speech Info: Adequate    SPECIAL CARE FACTORS FREQUENCY  PT (By licensed PT), OT (By licensed OT)     PT Frequency: 5x's/week OT Frequency: 5x's/week            Contractures Contractures Info: Not present    Additional Factors Info  Code Status, Allergies Code Status Info: DNR Allergies Info: Brimonidine Tartrate-timolol, Tape,  Penicillins, Breo Ellipta (Fluticasone Furoate-vilanterol), Vancomycin           Current Medications (04/10/2022):  This is the current hospital active medication list Current Facility-Administered Medications  Medication Dose Route Frequency Provider Last Rate Last Admin    azithromycin (ZITHROMAX) tablet 250 mg  250 mg Oral Daily Barb Merino, MD       dorzolamide (TRUSOPT) 2 % ophthalmic solution 1 drop  1 drop Both Eyes BID Truett Mainland, DO   1 drop at 04/10/22 9449   famotidine (PEPCID) tablet 10 mg  10 mg Oral QHS Barb Merino, MD       fluticasone (FLONASE) 50 MCG/ACT nasal spray 2 spray  2 spray Each Nare Daily PRN Barb Merino, MD       gabapentin (NEURONTIN) capsule 100 mg  100 mg Oral QHS Barb Merino, MD   100 mg at 04/09/22 2112   guaiFENesin (MUCINEX) 12 hr tablet 600 mg  600 mg Oral BID Barb Merino, MD   600 mg at 04/10/22 0836   ipratropium-albuterol (DUONEB) 0.5-2.5 (3) MG/3ML nebulizer solution 3 mL  3 mL Nebulization Q6H PRN Barb Merino, MD   3 mL at 04/10/22 0510   latanoprost (XALATAN) 0.005 % ophthalmic solution 1 drop  1 drop Both Eyes QHS Truett Mainland, DO   1 drop at 04/09/22 2113   levothyroxine (SYNTHROID) tablet 75 mcg  75 mcg Oral Q0600 Truett Mainland, DO   75 mcg at 04/10/22 0510   loratadine (CLARITIN) tablet 10 mg  10 mg Oral Daily Barb Merino, MD   10 mg at 04/10/22 0835   losartan (COZAAR) tablet 50 mg  50 mg Oral Daily Barb Merino, MD   50 mg at 04/10/22 0835   magnesium oxide (MAG-OX) tablet 800 mg  800 mg Oral QHS Barb Merino, MD   800 mg at 04/09/22 2113   metoprolol tartrate (LOPRESSOR) tablet 75 mg  75 mg Oral q AM Barb Merino, MD   75 mg at 04/10/22 0834   mometasone-formoterol (DULERA) 200-5 MCG/ACT inhaler 2 puff  2 puff Inhalation BID Barb Merino, MD   2 puff at 04/10/22 0833   montelukast (SINGULAIR) tablet 10 mg  10 mg Oral Daily Truett Mainland, DO   10 mg at 04/10/22 0836   ondansetron (ZOFRAN) tablet 4 mg  4 mg Oral Q6H PRN Truett Mainland, DO       Or   ondansetron [facility]) injection 4 mg  4 mg Intravenous Q6H PRN Truett Mainland, DO       potassium chloride (KLOR-CON) CR tablet 40 mEq  40 mEq Oral BID Truett Mainland, DO   40 mEq at 04/10/22 0835   predniSONE (DELTASONE)  tablet 40 mg  40 mg Oral Q breakfast Truett Mainland, DO   40 mg at 04/10/22 6759   Rivaroxaban (XARELTO) tablet 15 mg  15 mg Oral Q supper Truett Mainland, DO   15 mg at 04/09/22 1706   senna-docusate (Senokot-S) tablet 2 tablet  2 tablet Oral QHS Barb Merino, MD   2 tablet at 04/09/22 2112   torsemide (DEMADEX) tablet 20 mg  20 mg Oral Daily Barb Merino, MD   20 mg at 04/10/22 1638   And   torsemide (DEMADEX) tablet 10 mg  10 mg Oral Daily Barb Merino, MD   10 mg at 04/10/22 4665     Discharge Medications: Please see discharge summary for a list of discharge medications.  Relevant Imaging Results:  Relevant Lab Results:   Additional Information SSN: [id number]  Sherie Don, LCSW

## 2022-04-10 NOTE — Progress Notes (Signed)
PROGRESS NOTE    Jody Blake  QIW:[id number] DOB: 24-May-1925 DOA: 04/08/2022 PCP: [name], MD    Brief Narrative:  89 year old with history of COPD and chronic bronchitis, complete heart block status post pacemaker, chronic A-fib on anticoagulation, hypothyroidism, hypertension from independent living facility with caretaker support admitted with about 2 days of increasing wheezing and cough.  In the emergency room chest x-ray with retrocardiac opacities.  COVID-19 and influenza negative.  Admitted due to significant wheezing and exacerbation of bronchitis.   Assessment & Plan:   Chronic bronchitis with acute exacerbation: Community-acquired pneumonia.  Suspected.  Ruled out.  Aggressive bronchodilator therapy, IV steroids to oral steroids, inhalational steroids, scheduled and as needed bronchodilators, deep breathing exercises, incentive spirometry, chest physiotherapy. Blood cultures negative so far.  Sputum culture, Legionella and streptococcal antigen negative. Started on Rocephin azithromycin. Will continue azithromycin for 3 additional days to cover for bronchitis. Supplemental oxygen to keep saturations more than 92%.  Hypokalemia: Replaced.  Chronic medical issues including Essential hypertension, stable on home medications. Persistent A-fib, rate controlled on Xarelto.  Has pacemaker. CKD stage IIIb, creatinine at about baseline of 1.4-1.5.  Continue to mobilize.  Likely home tomorrow with caretaker support.    DVT prophylaxis:  Rivaroxaban (XARELTO) tablet 15 mg   Code Status: DNR Family Communication: Daughter on the phone. Disposition Plan: Status is: Inpatient Remains inpatient appropriate because: Oxygen need, IV antibiotics still symptomatic.     Consultants:  None  Procedures:  None  Antimicrobials:  Rocephin azithromycin 1/1----1/3 Azithromycin 1/3---   Subjective: Seen and examined.  She thinks he slept well last night.  Still has  cough and wheezing.  Afebrile.  Occasionally on 1 to 2 L of oxygen but she thinks she can come off of it.  Objective: Vitals:   04/10/22 0556 04/10/22 0801 04/10/22 0840 04/10/22 0853  BP: (!) 158/82 (!) 192/88    Pulse: 63 79 70   Resp: 19 18    Temp: 97.7 F (36.5 C) (!) 97.5 F (36.4 C)    TempSrc: Oral Oral    SpO2: 98% 99% (S) 95% 96%  Weight:      Height:        Intake/Output Summary (Last 24 hours) at 04/10/2022 1104 Last data filed at 04/10/2022 1041 Gross per 24 hour  Intake 1310.42 ml  Output --  Net 1310.42 ml   Filed Weights   04/08/22 1025  Weight: 59 kg    Examination:  General exam: Appears calm and comfortable except when she is coughing.  Respiratory system: Not in any distress.  Mostly upper airway sounds. Cardiovascular system: S1 & S2 heard, RRR.  Pacemaker left precordium. No pedal edema. Gastrointestinal system: Abdomen is nondistended, soft and nontender. No organomegaly or masses felt. Normal bowel sounds heard. Central nervous system: Alert and oriented. No focal neurological deficits. Appropriate for age.  Moves all extremities. Extremities: Symmetric 5 x 5 power.    Data Reviewed: I have personally reviewed following labs and imaging studies  CBC: Recent Labs  Lab 04/08/22 1045 04/09/22 0501  WBC 12.8* 9.8  NEUTROABS 8.7*  --   HGB 12.0 11.5*  HCT 38.4 36.7  MCV 100.0 100.3*  PLT 223 417   Basic Metabolic Panel: Recent Labs  Lab 04/08/22 1045 04/09/22 0501  NA 139 136  K 3.0* 3.6  CL 101 101  CO2 26 24  GLUCOSE 142* 196*  BUN 39* 39*  CREATININE 1.43* 1.39*  CALCIUM 8.9 8.8*   GFR:  Estimated Creatinine Clearance: 20.5 mL/min (A) (by C-G formula based on SCr of 1.39 mg/dL (H)). Liver Function Tests: Recent Labs  Lab 04/08/22 1045  AST 22  ALT 15  ALKPHOS 42  BILITOT 1.4*  PROT 6.2*  ALBUMIN 4.0   No results for input(s): "LIPASE", "AMYLASE" in the last 168 hours. No results for input(s): "AMMONIA" in the last 168  hours. Coagulation Profile: No results for input(s): "INR", "PROTIME" in the last 168 hours. Cardiac Enzymes: No results for input(s): "CKTOTAL", "CKMB", "CKMBINDEX", "TROPONINI" in the last 168 hours. BNP (last 3 results) No results for input(s): "PROBNP" in the last 8760 hours. HbA1C: No results for input(s): "HGBA1C" in the last 72 hours. CBG: No results for input(s): "GLUCAP" in the last 168 hours. Lipid Profile: No results for input(s): "CHOL", "HDL", "LDLCALC", "TRIG", "CHOLHDL", "LDLDIRECT" in the last 72 hours. Thyroid Function Tests: No results for input(s): "TSH", "T4TOTAL", "FREET4", "T3FREE", "THYROIDAB" in the last 72 hours. Anemia Panel: No results for input(s): "VITAMINB12", "FOLATE", "FERRITIN", "TIBC", "IRON", "RETICCTPCT" in the last 72 hours. Sepsis Labs: Recent Labs  Lab 04/08/22 1246  LATICACIDVEN 1.8    Recent Results (from the past 240 hour(s))  Culture, blood (routine x 2)     Status: None (Preliminary result)   Collection Time: 04/08/22 10:45 AM   Specimen: BLOOD RIGHT FOREARM  Result Value Ref Range Status   Specimen Description   Final    BLOOD RIGHT FOREARM BOTTLES DRAWN AEROBIC AND ANAEROBIC Performed at [facility], [place] [address] [place], [place] [address]    Special Requests   Final    Blood Culture results may not be optimal due to an excessive volume of blood received in culture bottles Performed at [place] [address], [place], [place] [address]    Culture   Final    NO GROWTH 2 DAYS Performed at [place] [address], [place], [place] [address]    Report Status PENDING  Incomplete  Resp panel by RT-PCR (RSV, Flu A&B, Covid) Anterior Nasal Swab     Status: None   Collection Time: 04/08/22 10:49 AM   Specimen: Anterior Nasal Swab  Result Value Ref Range Status   SARS Coronavirus 2 by RT PCR NEGATIVE NEGATIVE Final    Comment: (NOTE) SARS-CoV-2 target nucleic acids are NOT  DETECTED.  The SARS-CoV-2 RNA is generally detectable in upper respiratory specimens during the acute phase of infection. The lowest concentration of SARS-CoV-2 viral copies this assay can detect is 138 copies/mL. A negative result does not preclude SARS-Cov-2 infection and should not be used as the sole basis for treatment or other patient management decisions. A negative result may occur with  improper specimen collection/handling, submission of specimen other than nasopharyngeal swab, presence of viral mutation(s) within the areas targeted by this assay, and inadequate number of viral copies(<138 copies/mL). A negative result must be combined with clinical observations, patient history, and epidemiological information. The expected result is Negative.  Fact Sheet for Patients:  EntrepreneurPulse.com.au  Fact Sheet for Healthcare Providers:  IncredibleEmployment.be  This test is no t yet approved or cleared by the [place] FDA and  has been authorized for detection and/or diagnosis of SARS-CoV-2 by FDA under an Emergency Use Authorization (EUA). This EUA will remain  in effect (meaning this test can be used) for the duration of the COVID-19 declaration under Section 564(b)(1) of the Act, 21 U.S.C.section 360bbb-3(b)(1), unless the authorization is terminated  or revoked  sooner.       Influenza A by PCR NEGATIVE NEGATIVE Final   Influenza B by PCR NEGATIVE NEGATIVE Final    Comment: (NOTE) The Xpert Xpress SARS-CoV-2/FLU/RSV plus assay is intended as an aid in the diagnosis of influenza from Nasopharyngeal swab specimens and should not be used as a sole basis for treatment. Nasal washings and aspirates are unacceptable for Xpert Xpress SARS-CoV-2/FLU/RSV testing.  Fact Sheet for Patients: EntrepreneurPulse.com.au  Fact Sheet for Healthcare Providers: IncredibleEmployment.be  This test is not yet  approved or cleared by the [place] FDA and has been authorized for detection and/or diagnosis of SARS-CoV-2 by FDA under an Emergency Use Authorization (EUA). This EUA will remain in effect (meaning this test can be used) for the duration of the COVID-19 declaration under Section 564(b)(1) of the Act, 21 U.S.C. section 360bbb-3(b)(1), unless the authorization is terminated or revoked.     Resp Syncytial Virus by PCR NEGATIVE NEGATIVE Final    Comment: (NOTE) Fact Sheet for Patients: EntrepreneurPulse.com.au  Fact Sheet for Healthcare Providers: IncredibleEmployment.be  This test is not yet approved or cleared by the [place] FDA and has been authorized for detection and/or diagnosis of SARS-CoV-2 by FDA under an Emergency Use Authorization (EUA). This EUA will remain in effect (meaning this test can be used) for the duration of the COVID-19 declaration under Section 564(b)(1) of the Act, 21 U.S.C. section 360bbb-3(b)(1), unless the authorization is terminated or revoked.  Performed at [facility], [place] [address] [place], [place] [address]   Culture, blood (routine x 2)     Status: None (Preliminary result)   Collection Time: 04/08/22 10:49 AM   Specimen: BLOOD LEFT FOREARM  Result Value Ref Range Status   Specimen Description   Final    BLOOD LEFT FOREARM BOTTLES DRAWN AEROBIC AND ANAEROBIC Performed at [facility] [place] [address], [place], [place] [address]    Special Requests   Final    Blood Culture adequate volume Performed at [facility] [address] [place], [place] [address]    Culture   Final    NO GROWTH 2 DAYS Performed at [place] [address] [place], [place] [address]    Report Status PENDING  Incomplete  Respiratory (~20 pathogens) panel by PCR     Status: Abnormal   Collection Time: 04/08/22 10:49 AM   Specimen: Nasopharyngeal Swab;  Respiratory  Result Value Ref Range Status   Adenovirus NOT DETECTED NOT DETECTED Final   Coronavirus 229E NOT DETECTED NOT DETECTED Final    Comment: (NOTE) The Coronavirus on the Respiratory Panel, DOES NOT test for the novel  Coronavirus (2019 nCoV)    Coronavirus HKU1 NOT DETECTED NOT DETECTED Final   Coronavirus NL63 NOT DETECTED NOT DETECTED Final   Coronavirus OC43 DETECTED (A) NOT DETECTED Final   Metapneumovirus NOT DETECTED NOT DETECTED Final   Rhinovirus / Enterovirus NOT DETECTED NOT DETECTED Final   Influenza A NOT DETECTED NOT DETECTED Final   Influenza B NOT DETECTED NOT DETECTED Final   Parainfluenza Virus 1 NOT DETECTED NOT DETECTED Final   Parainfluenza Virus 2 NOT DETECTED NOT DETECTED Final   Parainfluenza Virus 3 NOT DETECTED NOT DETECTED Final   Parainfluenza Virus 4 NOT DETECTED NOT DETECTED Final   Respiratory Syncytial Virus NOT DETECTED NOT DETECTED Final   Bordetella pertussis NOT DETECTED NOT DETECTED Final   Bordetella Parapertussis NOT DETECTED NOT DETECTED Final   Chlamydophila pneumoniae NOT DETECTED  NOT DETECTED Final   Mycoplasma pneumoniae NOT DETECTED NOT DETECTED Final    Comment: Performed at [facility], [place] [address] [place], [place] [address]         Radiology Studies: DG Chest Port 1 View  Result Date: 04/08/2022 CLINICAL DATA:  Cough, wheezing EXAM: PORTABLE CHEST 1 VIEW COMPARISON:  11/28/2021 FINDINGS: Cardiomegaly. Left chest multi lead pacer. Probable retrocardiac atelectasis. The visualized skeletal structures are unremarkable. IMPRESSION: Cardiomegaly with probable retrocardiac atelectasis. Electronically Signed   By: Delanna Ahmadi M.D.   On: 04/08/2022 11:25        Scheduled Meds:  azithromycin  250 mg Oral Daily   dorzolamide  1 drop Both Eyes BID   famotidine  10 mg Oral QHS   gabapentin  100 mg Oral QHS   guaiFENesin  600 mg Oral BID   latanoprost  1 drop Both Eyes QHS   levothyroxine  75 mcg Oral Q0600    loratadine  10 mg Oral Daily   losartan  50 mg Oral Daily   magnesium oxide  800 mg Oral QHS   metoprolol tartrate  75 mg Oral q AM   mometasone-formoterol  2 puff Inhalation BID   montelukast  10 mg Oral Daily   potassium chloride  40 mEq Oral BID   predniSONE  40 mg Oral Q breakfast   Rivaroxaban  15 mg Oral Q supper   senna-docusate  2 tablet Oral QHS   torsemide  20 mg Oral Daily   And   torsemide  10 mg Oral Daily   Continuous Infusions:     LOS: 2 days    Time spent: 35 minutes    Barb Merino, MD [facility] Pager [phone]

## 2022-04-10 NOTE — TOC Initial Note (Signed)
Transition of Care [facility]) - Initial/Assessment Note   Patient Details  Name: Jody Blake MRN: [id number] Date of Birth: 24-Feb-1926  Transition of [facility]) CM/SW Contact:    Sherie Don, LCSW Phone Number: 04/10/2022, 11:12 AM  Clinical Narrative: CSW spoke with Butch Penny at [place] regarding the patient's discharge plan. Patient currently resides in independent living and has an Engineer, production. PT evaluation did not recommend short-term rehab, but it is a covered benefit at [facility] for the patient if she and the family want rehab after discharge.  CSW spoke with daughter, Arther Abbott, regarding the discharge plan. Per daughter, she and the patient want the patient to go to rehab at [facility] for a few days. CSW updated Butch Penny at [facility] FL2 done, PASRR received. CSW faxed FL2 to facility in hub. TOC to follow.  Expected Discharge Plan: Skilled Nursing Facility Barriers to Discharge: Continued Medical Work up  Patient Goals and CMS Choice Patient states their goals for this hospitalization and ongoing recovery are:: Go to [facility] for rehab for a few days CMS Medicare.gov Compare Post Acute Care list provided to:: Patient Represenative (must comment) Choice offered to / list presented to : Adult Children  Expected Discharge Plan and Services In-house Referral: Clinical Social Work Post Acute Care Choice: [place] Living arrangements for the past 2 months: [place]          DME Arranged: N/A DME Agency: NA  Prior Living Arrangements/Services Living arrangements for the past 2 months: [place] Lives with:: Self Patient language and need for interpreter reviewed:: Yes Do you feel safe going back to the place where you live?: Yes      Need for Family Participation in Patient Care: No (Comment) Care giver support system in place?: Yes (comment) Current home services: Homehealth aide Criminal Activity/Legal Involvement Pertinent to  Current Situation/Hospitalization: No - Comment as needed  Activities of Daily Living Home Assistive Devices/Equipment: Walker (specify type) (front wheel walker) ADL Screening (condition at time of admission) Patient's cognitive ability adequate to safely complete daily activities?: Yes Is the patient deaf or have difficulty hearing?: Yes Does the patient have difficulty seeing, even when wearing glasses/contacts?: Yes Does the patient have difficulty concentrating, remembering, or making decisions?: Yes Patient able to express need for assistance with ADLs?: Yes Does the patient have difficulty dressing or bathing?: No Independently performs ADLs?: Yes (appropriate for developmental age) Does the patient have difficulty walking or climbing stairs?: No Weakness of Legs: None Weakness of Arms/Hands: None  Permission Sought/Granted Permission sought to share information with : Facility Art therapist granted to share information with : Yes, Verbal Permission Granted Permission granted to share info w AGENCY: [facility]  Emotional Assessment Attitude/Demeanor/Rapport: Engaged Affect (typically observed): Accepting Orientation: : Oriented to Self, Oriented to Place, Oriented to  Time, Oriented to Situation Alcohol / Substance Use: Not Applicable Psych Involvement: No (comment)  Admission diagnosis:  CAP (community acquired pneumonia) [J18.9] Dyspnea, unspecified type [R06.00] Acute cough [R05.1] Patient Active Problem List   Diagnosis Date Noted   COPD with acute exacerbation ([place]) 04/08/2022   Hypokalemia 56/38/7564   Acute diastolic CHF (congestive heart failure) ([place]) 04/12/2021   Acute on chronic diastolic CHF (congestive heart failure) ([place]) 04/11/2021   CAP (community acquired pneumonia) 10/27/2019   Plantar fasciitis of right foot 07/07/2019   Acute embolism and thrombosis of deep vein of lower extremity ([place]) 03/18/2018   Osteoarthritis of right  glenohumeral joint 08/22/2017   Macular pucker, right  eye 07/08/2017   Primary open angle glaucoma of both eyes, mild stage 07/08/2017   TIA (transient ischemic attack) 06/24/2017   Hypercoagulable state due to atrial fibrillation ([place]) 03/05/2017   Ataxia 11/14/2016   DVT (deep vein thrombosis) in pregnancy 11/14/2016   CKD (chronic kidney disease), stage III ([place]) 11/14/2016   Chronic diastolic congestive heart failure ([place]) 11/14/2016   Hypertensive urgency 11/14/2016   Hypothyroidism 11/08/2015   Age-related macular degeneration, dry, both eyes 09/05/2015   Bilateral ocular hypertension 09/05/2015   Pseudophakia of both eyes 09/05/2015   Pleural effusion on left 06/17/2015   Complete heart block (HCC) 06/22/2014   Sick sinus syndrome ([place]) 04/29/2014   Tachycardia-bradycardia syndrome ([place]) 01/06/2013   Cardiac pacemaker in situ 09/07/2012   Long term (current) use of anticoagulants 09/07/2012   Orthostatic hypotension 09/07/2012   Syncope 09/06/2012   Choroidal nevus of left eye 07/26/2011   COPD mixed type ([place]) 04/26/2010   Seasonal and perennial allergic rhinitis 10/27/2008   Essential hypertension 04/19/2008   Permanent atrial fibrillation ([place]) 04/19/2008   GERD 10/29/2007   Bronchitis, chronic obstructive, with exacerbation ([place]) 04/06/2007   APPENDECTOMY, HX OF 04/06/2007   PCP:  [name], MD Pharmacy:   [facility] [place], [place] - [address] AT [place] [address] [place] [address] Phone: [phone] Fax: [phone]  [place], [place] - [address] [place] [address] Phone: [phone] Fax: [phone]  Social Determinants of Health (SDOH) Social History: [place]: No Food Insecurity (04/08/2022)  Housing: Low Risk  (04/08/2022)  Transportation Needs: No Transportation Needs (04/08/2022)   Utilities: Unknown (04/08/2022)  Alcohol Screen: Low Risk  (02/16/2021)  Depression (PHQ2-9): Low Risk  (03/18/2022)  Financial Resource Strain: Low Risk  (02/16/2021)  Physical Activity: Sufficiently Active (02/16/2021)  Social Connections: Moderately Isolated (02/16/2021)  Stress: No Stress Concern Present (02/16/2021)  Tobacco Use: Low Risk  (04/08/2022)   SDOH Interventions: Food Insecurity Interventions: Patient Refused Housing Interventions: Patient Refused Transportation Interventions: Intervention Not Indicated Utilities Interventions: Patient Refused  Readmission Risk Interventions     No data to display

## 2022-04-11 DIAGNOSIS — I5032 Chronic diastolic (congestive) heart failure: Secondary | ICD-10-CM | POA: Diagnosis not present

## 2022-04-11 DIAGNOSIS — I4821 Permanent atrial fibrillation: Secondary | ICD-10-CM | POA: Diagnosis not present

## 2022-04-11 DIAGNOSIS — J441 Chronic obstructive pulmonary disease with (acute) exacerbation: Secondary | ICD-10-CM | POA: Diagnosis not present

## 2022-04-11 MED ORDER — AZITHROMYCIN 250 MG PO TABS: 250.0000 mg | ORAL_TABLET | Freq: Every day | ORAL | 0 refills | Status: AC

## 2022-04-11 MED ORDER — PREDNISONE 10 MG PO TABS: ORAL_TABLET | ORAL | Status: DC

## 2022-04-11 NOTE — Discharge Summary (Signed)
Physician Discharge Summary  Jody Blake BDZ:[id number] DOB: 12/16/1925 DOA: 04/08/2022  PCP: [name], MD  Admit date: 04/08/2022 Discharge date: 04/11/2022  Admitted From: Independent living Disposition: Skilled nursing facility  Recommendations for Outpatient Follow-up:  Follow up with PCP in 1-2 weeks at [facility]: N/A Equipment/Devices: N/A  Discharge Condition: Fair, stable CODE STATUS: DNR Diet recommendation: Regular diet, nutritional supplements  Discharge summary: 89 year old with history of COPD and chronic bronchitis, complete heart block status post pacemaker, chronic A-fib on anticoagulation, hypothyroidism, hypertension from independent living facility with caretaker support admitted with about 2 days of increasing wheezing and cough.  In the emergency room chest x-ray with retrocardiac opacities.  COVID-19 and influenza negative.  Admitted due to significant wheezing and exacerbation of bronchitis.  Patient also on chronic prednisone therapy 5 mg daily.     Assessment & Plan:   Chronic bronchitis with acute exacerbation: Community-acquired pneumonia.  Suspected.  Ruled out.   Treated with bronchodilator therapy, increasing dose of oral steroids , inhalational steroids, scheduled and as needed bronchodilators, deep breathing exercises, incentive spirometry, chest physiotherapy. Blood cultures negative so far.  Sputum culture, Legionella and streptococcal antigen negative. Good clinical response.  He still has some cough. Plan: Complete 5 days of azithromycin, 2 additional days. Prednisone 20 mg daily for 3 days, 10 mg daily for 3 days then back to her home regimen of 5 mg daily. Continue to do chest physiotherapy and breathing treatments at home.  Already optimized on steroid inhalers, nebulizer therapy. Focus on mobility.   Hypokalemia: Replaced and adequate.   Chronic medical issues including Essential hypertension, stable on home  medications. Persistent A-fib, rate controlled on Xarelto.  Has pacemaker. CKD stage IIIb, creatinine at about baseline of 1.4-1.5.   Stable for discharge to skilled level of care for inpatient therapy before returning to independent living with caretaker support.  Discharge Diagnoses:  Principal Problem:   CAP (community acquired pneumonia) Active Problems:   Essential hypertension   Permanent atrial fibrillation (HCC)   GERD   Hypothyroidism   CKD (chronic kidney disease), stage III (HCC)   Chronic diastolic congestive heart failure ([place])   COPD with acute exacerbation (HCC)   Hypokalemia    Discharge Instructions  Discharge Instructions     Diet - low sodium heart healthy   Complete by: As directed    Increase activity slowly   Complete by: As directed       Allergies as of 04/11/2022       Reactions   Brimonidine Tartrate-timolol Other (See Comments)   Systemic malaise amigen eye drop   Tape Other (See Comments)   SKIN IS VERY FRAGILE!!   Penicillins Hives   Breo Ellipta [fluticasone Furoate-vilanterol] Other (See Comments), Hypertension   Increased blood pressure   Vancomycin Other (See Comments)   Red man syndrome - Mild redness and discomfort. Able to complete initial dose.         Medication List     STOP taking these medications    budesonide-formoterol 160-4.5 MCG/ACT inhaler Commonly known as: Symbicort   Comirnaty syringe Generic drug: COVID-19 mRNA vaccine 2023-2024       TAKE these medications    albuterol 108 (90 Base) MCG/ACT inhaler Commonly known as: VENTOLIN HFA Inhale 2 puffs into the lungs every 6 (six) hours as needed for wheezing or shortness of breath.   Azelastine HCl 137 MCG/SPRAY Soln USE 1 TO 2 SPRAYS INTO BOTH NOSTRILS TWICE DAILY. What changed:  how much to take how to take this when to take this additional instructions   azithromycin 250 MG tablet Commonly known as: ZITHROMAX Take 1 tablet (250 mg total) by  mouth daily for 2 days.   Calcium 600-10 MG-MCG Chew Chew 1 capsule by mouth daily.   diclofenac Sodium 1 % Gel Commonly known as: VOLTAREN Apply 4 g topically 4 (four) times daily. What changed:  how much to take when to take this additional instructions   dorzolamide 2 % ophthalmic solution Commonly known as: TRUSOPT Place 1 drop into both eyes 2 (two) times daily.   Dulera 200-5 MCG/ACT Aero Generic drug: mometasone-formoterol Inhale 2 puffs into the lungs 2 (two) times daily.   famotidine 40 MG tablet Commonly known as: Pepcid Take 1 tablet (40 mg total) by mouth at bedtime.   fluticasone 50 MCG/ACT nasal spray Commonly known as: FLONASE USE 2 SPRAYS EACH NOSTRIL ONCE A DAY AS NEEDED FOR ALLERGIES OR CONGESTION. What changed: See the new instructions.   gabapentin 100 MG capsule Commonly known as: NEURONTIN Take 1 capsule (100 mg total) by mouth at bedtime.   guaiFENesin 600 MG 12 hr tablet Commonly known as: MUCINEX Take 1 tablet (600 mg total) by mouth 2 (two) times daily. What changed:  when to take this reasons to take this   latanoprost 0.005 % ophthalmic solution Commonly known as: XALATAN Place 1 drop into both eyes at bedtime.   levothyroxine 75 MCG tablet Commonly known as: SYNTHROID Take 1 tablet (75 mcg total) by mouth daily. What changed: when to take this   loratadine 10 MG tablet Commonly known as: CLARITIN Take 1 tablet (10 mg total) by mouth daily.   losartan 50 MG tablet Commonly known as: COZAAR Take 1 tablet (50 mg total) by mouth daily.   Lutein 20 MG Caps Take 1 capsule (20 mg total) by mouth daily.   Magnesium 500 MG Tabs Take 750 mg by mouth See admin instructions. Take 750 mg by mouth every evening (in conjunction with 3 BioComplete capsules)   metoprolol tartrate 50 MG tablet Commonly known as: LOPRESSOR Take 1.5 tablets (75 mg total) by mouth in the morning.   montelukast 10 MG tablet Commonly known as: SINGULAIR Take  1 tablet (10 mg total) by mouth daily. What changed: when to take this   NON FORMULARY Take 3 capsules by mouth See admin instructions. Gundry MD Bio Complete 3 - Prebiotic, Probiotic, Postbiotic to Support Optimal Gut Health capsules- TAKE 3 CAPSULES BY MOUTH EVERY EVENING (in conjunction with 750 mg Magnesium)   potassium chloride 10 MEQ tablet Commonly known as: KLOR-CON Take 2 tablets (20 mEq total) by mouth daily. What changed:  how much to take when to take this   predniSONE 5 MG tablet Commonly known as: DELTASONE Take 1 tablet (5 mg total) by mouth daily with breakfast. What changed: Another medication with the same name was added. Make sure you understand how and when to take each.   predniSONE 10 MG tablet Commonly known as: DELTASONE 2 tabs daily for 3 days  1 tabs daily for 3 days Start taking on: April 12, 2022 What changed: You were already taking a medication with the same name, and this prescription was added. Make sure you understand how and when to take each.   PreserVision AREDS 2 Caps Take 1 capsule by mouth in the morning and at bedtime.   Rivaroxaban 15 MG Tabs tablet Commonly known as: Xarelto TAKE 1 TABLET BY MOUTH  DAILY WITH SUPPER What changed:  how much to take how to take this when to take this additional instructions   sennosides-docusate sodium 8.6-50 MG tablet Commonly known as: SENOKOT-S Take 2 tablets by mouth at bedtime.   torsemide 20 MG tablet Commonly known as: DEMADEX Take 1 tablet (20 mg total) by mouth daily as needed. 3 lb weight gain in 1 day or 5 lb in 1 week or excessive edema What changed:  how much to take when to take this additional instructions   torsemide 20 MG tablet Commonly known as: DEMADEX Take 1.5 tablets (30 mg total) by mouth daily. What changed: Another medication with the same name was changed. Make sure you understand how and when to take each.   TYLENOL 500 MG tablet Generic drug:  acetaminophen Take 500 mg by mouth in the morning.   Vitamin D 125 MCG (5000 UT) Caps Take 1 capsule by mouth daily. What changed: how much to take        Contact information for after-discharge care     Destination     [facility] .   Service: Skilled Nursing Contact information: [place] [address] [phone]                    Allergies  Allergen Reactions   Brimonidine Tartrate-Timolol Other (See Comments)    Systemic malaise amigen eye drop   Tape Other (See Comments)    SKIN IS VERY FRAGILE!!   Penicillins Hives   Breo Ellipta [Fluticasone Furoate-Vilanterol] Other (See Comments) and Hypertension    Increased blood pressure   Vancomycin Other (See Comments)    Red man syndrome - Mild redness and discomfort. Able to complete initial dose.     Consultations: None   Procedures/Studies: DG Chest Port 1 View  Result Date: 04/08/2022 CLINICAL DATA:  Cough, wheezing EXAM: PORTABLE CHEST 1 VIEW COMPARISON:  11/28/2021 FINDINGS: Cardiomegaly. Left chest multi lead pacer. Probable retrocardiac atelectasis. The visualized skeletal structures are unremarkable. IMPRESSION: Cardiomegaly with probable retrocardiac atelectasis. Electronically Signed   By: Delanna Ahmadi M.D.   On: 04/08/2022 11:25   (Echo, Carotid, EGD, Colonoscopy, ERCP)    Subjective: Seen in morning rounds.  Caretaker at the bedside.  No overnight events.  Afebrile.  Mild cough present.   Discharge Exam: Vitals:   04/11/22 0555 04/11/22 0755  BP: (!) 170/77   Pulse: 61   Resp: 16   Temp: 98.1 F (36.7 C)   SpO2: 98% 94%   Vitals:   04/10/22 1941 04/10/22 2305 04/11/22 0555 04/11/22 0755  BP:  (!) 172/100 (!) 170/77   Pulse:  73 61   Resp:  14 16   Temp:  98 F (36.7 C) 98.1 F (36.7 C)   TempSrc:  Oral Oral   SpO2: 98% 97% 98% 94%  Weight:      Height:        General: Pt is alert, awake, not in acute  distress Frail.  Age-appropriate.  Pleasant and interactive to conversation. Cardiovascular: RRR, S1/S2 +, no rubs, no gallops, pacemaker in place. Respiratory: CTA bilaterally, no wheezing, no rhonchi, occasional conducted upper airway sounds. Abdominal: Soft, NT, ND, bowel sounds + Extremities: no edema, no cyanosis    The results of significant diagnostics from this hospitalization (including imaging, microbiology, ancillary and laboratory) are listed below for reference.     Microbiology: Recent Results (from the past 240 hour(s))  Culture, blood (routine x 2)  Status: None (Preliminary result)   Collection Time: 04/08/22 10:45 AM   Specimen: BLOOD RIGHT FOREARM  Result Value Ref Range Status   Specimen Description   Final    BLOOD RIGHT FOREARM BOTTLES DRAWN AEROBIC AND ANAEROBIC Performed at [facility], [place] [address] [place], [place] [address]    Special Requests   Final    Blood Culture results may not be optimal due to an excessive volume of blood received in culture bottles Performed at [facility] [address] [place], [place] [address]    Culture   Final    NO GROWTH 3 DAYS Performed at [facility], [place] [address], [place], [place] [address]    Report Status PENDING  Incomplete  Resp panel by RT-PCR (RSV, Flu A&B, Covid) Anterior Nasal Swab     Status: None   Collection Time: 04/08/22 10:49 AM   Specimen: Anterior Nasal Swab  Result Value Ref Range Status   SARS Coronavirus 2 by RT PCR NEGATIVE NEGATIVE Final    Comment: (NOTE) SARS-CoV-2 target nucleic acids are NOT DETECTED.  The SARS-CoV-2 RNA is generally detectable in upper respiratory specimens during the acute phase of infection. The lowest concentration of SARS-CoV-2 viral copies this assay can detect is 138 copies/mL. A negative result does not preclude SARS-Cov-2 infection and should not be used as the sole basis for treatment or other patient  management decisions. A negative result may occur with  improper specimen collection/handling, submission of specimen other than nasopharyngeal swab, presence of viral mutation(s) within the areas targeted by this assay, and inadequate number of viral copies(<138 copies/mL). A negative result must be combined with clinical observations, patient history, and epidemiological information. The expected result is Negative.  Fact Sheet for Patients:  EntrepreneurPulse.com.au  Fact Sheet for Healthcare Providers:  IncredibleEmployment.be  This test is no t yet approved or cleared by the [place] FDA and  has been authorized for detection and/or diagnosis of SARS-CoV-2 by FDA under an Emergency Use Authorization (EUA). This EUA will remain  in effect (meaning this test can be used) for the duration of the COVID-19 declaration under Section 564(b)(1) of the Act, 21 U.S.C.section 360bbb-3(b)(1), unless the authorization is terminated  or revoked sooner.       Influenza A by PCR NEGATIVE NEGATIVE Final   Influenza B by PCR NEGATIVE NEGATIVE Final    Comment: (NOTE) The Xpert Xpress SARS-CoV-2/FLU/RSV plus assay is intended as an aid in the diagnosis of influenza from Nasopharyngeal swab specimens and should not be used as a sole basis for treatment. Nasal washings and aspirates are unacceptable for Xpert Xpress SARS-CoV-2/FLU/RSV testing.  Fact Sheet for Patients: EntrepreneurPulse.com.au  Fact Sheet for Healthcare Providers: IncredibleEmployment.be  This test is not yet approved or cleared by the [place] FDA and has been authorized for detection and/or diagnosis of SARS-CoV-2 by FDA under an Emergency Use Authorization (EUA). This EUA will remain in effect (meaning this test can be used) for the duration of the COVID-19 declaration under Section 564(b)(1) of the Act, 21 U.S.C. section 360bbb-3(b)(1),  unless the authorization is terminated or revoked.     Resp Syncytial Virus by PCR NEGATIVE NEGATIVE Final    Comment: (NOTE) Fact Sheet for Patients: EntrepreneurPulse.com.au  Fact Sheet for Healthcare Providers: IncredibleEmployment.be  This test is not yet approved or cleared by the [place] FDA and has been authorized for detection and/or diagnosis of SARS-CoV-2 by FDA under an Emergency Use Authorization (EUA).  This EUA will remain in effect (meaning this test can be used) for the duration of the COVID-19 declaration under Section 564(b)(1) of the Act, 21 U.S.C. section 360bbb-3(b)(1), unless the authorization is terminated or revoked.  Performed at [facility], [place] [address] [place], [place] [address]   Culture, blood (routine x 2)     Status: None (Preliminary result)   Collection Time: 04/08/22 10:49 AM   Specimen: BLOOD LEFT FOREARM  Result Value Ref Range Status   Specimen Description   Final    BLOOD LEFT FOREARM BOTTLES DRAWN AEROBIC AND ANAEROBIC Performed at [facility] [address] [place], [place] [address]    Special Requests   Final    Blood Culture adequate volume Performed at [facility] [address] [place], [place] [address]    Culture   Final    NO GROWTH 3 DAYS Performed at [facility], [place] [address], [place], [place] [address]    Report Status PENDING  Incomplete  Respiratory (~20 pathogens) panel by PCR     Status: Abnormal   Collection Time: 04/08/22 10:49 AM   Specimen: Nasopharyngeal Swab; Respiratory  Result Value Ref Range Status   Adenovirus NOT DETECTED NOT DETECTED Final   Coronavirus 229E NOT DETECTED NOT DETECTED Final    Comment: (NOTE) The Coronavirus on the Respiratory Panel, DOES NOT test for the novel  Coronavirus (2019 nCoV)    Coronavirus HKU1 NOT DETECTED NOT DETECTED Final   Coronavirus NL63 NOT DETECTED  NOT DETECTED Final   Coronavirus OC43 DETECTED (A) NOT DETECTED Final   Metapneumovirus NOT DETECTED NOT DETECTED Final   Rhinovirus / Enterovirus NOT DETECTED NOT DETECTED Final   Influenza A NOT DETECTED NOT DETECTED Final   Influenza B NOT DETECTED NOT DETECTED Final   Parainfluenza Virus 1 NOT DETECTED NOT DETECTED Final   Parainfluenza Virus 2 NOT DETECTED NOT DETECTED Final   Parainfluenza Virus 3 NOT DETECTED NOT DETECTED Final   Parainfluenza Virus 4 NOT DETECTED NOT DETECTED Final   Respiratory Syncytial Virus NOT DETECTED NOT DETECTED Final   Bordetella pertussis NOT DETECTED NOT DETECTED Final   Bordetella Parapertussis NOT DETECTED NOT DETECTED Final   Chlamydophila pneumoniae NOT DETECTED NOT DETECTED Final   Mycoplasma pneumoniae NOT DETECTED NOT DETECTED Final    Comment: Performed at [facility], [place]. [address] [place], [place] [address]     Labs: BNP (last 3 results) Recent Labs    04/08/22 1045  BNP 962.9*   Basic Metabolic Panel: Recent Labs  Lab 04/08/22 1045 04/09/22 0501  NA 139 136  K 3.0* 3.6  CL 101 101  CO2 26 24  GLUCOSE 142* 196*  BUN 39* 39*  CREATININE 1.43* 1.39*  CALCIUM 8.9 8.8*   Liver Function Tests: Recent Labs  Lab 04/08/22 1045  AST 22  ALT 15  ALKPHOS 42  BILITOT 1.4*  PROT 6.2*  ALBUMIN 4.0   No results for input(s): "LIPASE", "AMYLASE" in the last 168 hours. No results for input(s): "AMMONIA" in the last 168 hours. CBC: Recent Labs  Lab 04/08/22 1045 04/09/22 0501  WBC 12.8* 9.8  NEUTROABS 8.7*  --   HGB 12.0 11.5*  HCT 38.4 36.7  MCV 100.0 100.3*  PLT 223 217   Cardiac Enzymes: No results for input(s): "CKTOTAL", "CKMB", "CKMBINDEX", "TROPONINI" in the last 168 hours. BNP: Invalid input(s): "POCBNP" CBG: No results for input(s): "GLUCAP" in the last 168 hours. D-Dimer  No results for input(s): "DDIMER" in the last 72 hours. Hgb A1c No results for input(s): "HGBA1C" in the last 72 hours. Lipid  Profile No results for input(s): "CHOL", "HDL", "LDLCALC", "TRIG", "CHOLHDL", "LDLDIRECT" in the last 72 hours. Thyroid function studies No results for input(s): "TSH", "T4TOTAL", "T3FREE", "THYROIDAB" in the last 72 hours.  Invalid input(s): "FREET3" Anemia work up No results for input(s): "VITAMINB12", "FOLATE", "FERRITIN", "TIBC", "IRON", "RETICCTPCT" in the last 72 hours. Urinalysis    Component Value Date/Time   COLORURINE YELLOW 04/11/2021 0950   APPEARANCEUR CLEAR 04/11/2021 0950   LABSPEC 1.006 04/11/2021 0950   PHURINE 7.0 04/11/2021 0950   GLUCOSEU NEGATIVE 04/11/2021 0950   HGBUR NEGATIVE 04/11/2021 0950   BILIRUBINUR NEGATIVE 04/11/2021 0950   KETONESUR NEGATIVE 04/11/2021 0950   PROTEINUR NEGATIVE 04/11/2021 0950   UROBILINOGEN 0.2 01/26/2015 0325   NITRITE POSITIVE (A) 04/11/2021 0950   LEUKOCYTESUR TRACE (A) 04/11/2021 0950   Sepsis Labs Recent Labs  Lab 04/08/22 1045 04/09/22 0501  WBC 12.8* 9.8   Microbiology Recent Results (from the past 240 hour(s))  Culture, blood (routine x 2)     Status: None (Preliminary result)   Collection Time: 04/08/22 10:45 AM   Specimen: BLOOD RIGHT FOREARM  Result Value Ref Range Status   Specimen Description   Final    BLOOD RIGHT FOREARM BOTTLES DRAWN AEROBIC AND ANAEROBIC Performed at [facility], [place] [address], [place], [place] [address]    Special Requests   Final    Blood Culture results may not be optimal due to an excessive volume of blood received in culture bottles Performed at [facility] [address] [place], [place] [address]    Culture   Final    NO GROWTH 3 DAYS Performed at [facility], [place] [address] [place], [place] [address]    Report Status PENDING  Incomplete  Resp panel by RT-PCR (RSV, Flu A&B, Covid) Anterior Nasal Swab     Status: None   Collection Time: 04/08/22 10:49 AM   Specimen: Anterior Nasal Swab  Result Value Ref Range Status   SARS  Coronavirus 2 by RT PCR NEGATIVE NEGATIVE Final    Comment: (NOTE) SARS-CoV-2 target nucleic acids are NOT DETECTED.  The SARS-CoV-2 RNA is generally detectable in upper respiratory specimens during the acute phase of infection. The lowest concentration of SARS-CoV-2 viral copies this assay can detect is 138 copies/mL. A negative result does not preclude SARS-Cov-2 infection and should not be used as the sole basis for treatment or other patient management decisions. A negative result may occur with  improper specimen collection/handling, submission of specimen other than nasopharyngeal swab, presence of viral mutation(s) within the areas targeted by this assay, and inadequate number of viral copies(<138 copies/mL). A negative result must be combined with clinical observations, patient history, and epidemiological information. The expected result is Negative.  Fact Sheet for Patients:  EntrepreneurPulse.com.au  Fact Sheet for Healthcare Providers:  IncredibleEmployment.be  This test is no t yet approved or cleared by the [place] FDA and  has been authorized for detection and/or diagnosis of SARS-CoV-2 by FDA under an Emergency Use Authorization (EUA). This EUA will remain  in effect (meaning this test can be used) for the duration of the COVID-19 declaration under Section 564(b)(1) of the Act, 21 U.S.C.section 360bbb-3(b)(1), unless the authorization is terminated  or revoked sooner.       Influenza A by PCR NEGATIVE NEGATIVE Final   Influenza B  by PCR NEGATIVE NEGATIVE Final    Comment: (NOTE) The Xpert Xpress SARS-CoV-2/FLU/RSV plus assay is intended as an aid in the diagnosis of influenza from Nasopharyngeal swab specimens and should not be used as a sole basis for treatment. Nasal washings and aspirates are unacceptable for Xpert Xpress SARS-CoV-2/FLU/RSV testing.  Fact Sheet for  Patients: EntrepreneurPulse.com.au  Fact Sheet for Healthcare Providers: IncredibleEmployment.be  This test is not yet approved or cleared by the [place] FDA and has been authorized for detection and/or diagnosis of SARS-CoV-2 by FDA under an Emergency Use Authorization (EUA). This EUA will remain in effect (meaning this test can be used) for the duration of the COVID-19 declaration under Section 564(b)(1) of the Act, 21 U.S.C. section 360bbb-3(b)(1), unless the authorization is terminated or revoked.     Resp Syncytial Virus by PCR NEGATIVE NEGATIVE Final    Comment: (NOTE) Fact Sheet for Patients: EntrepreneurPulse.com.au  Fact Sheet for Healthcare Providers: IncredibleEmployment.be  This test is not yet approved or cleared by the [place] FDA and has been authorized for detection and/or diagnosis of SARS-CoV-2 by FDA under an Emergency Use Authorization (EUA). This EUA will remain in effect (meaning this test can be used) for the duration of the COVID-19 declaration under Section 564(b)(1) of the Act, 21 U.S.C. section 360bbb-3(b)(1), unless the authorization is terminated or revoked.  Performed at [facility], [place] [address], [place], [place] [address]   Culture, blood (routine x 2)     Status: None (Preliminary result)   Collection Time: 04/08/22 10:49 AM   Specimen: BLOOD LEFT FOREARM  Result Value Ref Range Status   Specimen Description   Final    BLOOD LEFT FOREARM BOTTLES DRAWN AEROBIC AND ANAEROBIC Performed at [facility] [address], [place], [place] [address]    Special Requests   Final    Blood Culture adequate volume Performed at [facility] [address] [place], [place] [address]    Culture   Final    NO GROWTH 3 DAYS Performed at [facility], [place] [address], [place], [place] [address]    Report  Status PENDING  Incomplete  Respiratory (~20 pathogens) panel by PCR     Status: Abnormal   Collection Time: 04/08/22 10:49 AM   Specimen: Nasopharyngeal Swab; Respiratory  Result Value Ref Range Status   Adenovirus NOT DETECTED NOT DETECTED Final   Coronavirus 229E NOT DETECTED NOT DETECTED Final    Comment: (NOTE) The Coronavirus on the Respiratory Panel, DOES NOT test for the novel  Coronavirus (2019 nCoV)    Coronavirus HKU1 NOT DETECTED NOT DETECTED Final   Coronavirus NL63 NOT DETECTED NOT DETECTED Final   Coronavirus OC43 DETECTED (A) NOT DETECTED Final   Metapneumovirus NOT DETECTED NOT DETECTED Final   Rhinovirus / Enterovirus NOT DETECTED NOT DETECTED Final   Influenza A NOT DETECTED NOT DETECTED Final   Influenza B NOT DETECTED NOT DETECTED Final   Parainfluenza Virus 1 NOT DETECTED NOT DETECTED Final   Parainfluenza Virus 2 NOT DETECTED NOT DETECTED Final   Parainfluenza Virus 3 NOT DETECTED NOT DETECTED Final   Parainfluenza Virus 4 NOT DETECTED NOT DETECTED Final   Respiratory Syncytial Virus NOT DETECTED NOT DETECTED Final   Bordetella pertussis NOT DETECTED NOT DETECTED Final   Bordetella Parapertussis NOT DETECTED NOT DETECTED Final   Chlamydophila pneumoniae NOT DETECTED NOT DETECTED Final   Mycoplasma pneumoniae NOT DETECTED NOT DETECTED Final    Comment: Performed at  [facility], [place] [address], [place], [place] [address]     Time coordinating discharge:  35 minutes  SIGNED:   Barb Merino, MD  [facility] 04/11/2022, 10:13 AM

## 2022-04-11 NOTE — TOC Transition Note (Signed)
Transition of Care [facility]) - CM/SW Discharge Note  Patient Details  Name: Jody Blake MRN: [id number] Date of Birth: 1925-12-08  Transition of Care [facility]) CM/SW Contact:  Sherie Don, LCSW Phone Number: 04/11/2022, 10:31 AM  Clinical Narrative: CSW notified Butch Penny at [facility] that patient will discharge today. CSW confirmed with daughter that she will transport the patient back to [facility] with assistance from the patient's caregiver. The number for report is [phone]. Discharge summary, discharge orders, and SNF transfer report faxed to facility in hub. Discharge packet completed and provided to RN. TOC signing off.    Final next level of care: Skilled Nursing Facility Barriers to Discharge: Barriers Resolved  Patient Goals and CMS Choice CMS Medicare.gov Compare Post Acute Care list provided to:: Patient Represenative (must comment) Choice offered to / list presented to : Patient, Adult Children  Discharge Placement PASRR number recieved: 04/10/22      Patient chooses bed at: [facility] Patient to be transferred to facility by: Daughter Name of family member notified: Malachy Mood hunt (daughter) Patient and family notified of of transfer: 04/11/22  Discharge Plan and Services Additional resources added to the After Visit Summary for   In-house Referral: Clinical Social Work Post Acute Care Choice: [facility]          DME Arranged: N/A DME Agency: NA  Social Determinants of Health ([place]) Interventions SDOH Screenings   Food Insecurity: No Food Insecurity (04/08/2022)  Housing: Low Risk  (04/08/2022)  Transportation Needs: No Transportation Needs (04/08/2022)  Utilities: Unknown (04/08/2022)  Alcohol Screen: Low Risk  (02/16/2021)  Depression (PHQ2-9): Low Risk  (03/18/2022)  Financial Resource Strain: Low Risk  (02/16/2021)  Physical Activity: Sufficiently Active (02/16/2021)  Social Connections: Moderately Isolated (02/16/2021)  Stress: No Stress Concern  Present (02/16/2021)  Tobacco Use: Low Risk  (04/10/2022)   Readmission Risk Interventions     No data to display

## 2022-04-11 NOTE — Progress Notes (Signed)
Report called to [employer] and given to [place], Therapist, sports. Pt taken to front entrance via wheelchair with caregiver to return to [place].

## 2022-04-12 ENCOUNTER — Encounter: Payer: Self-pay | Admitting: Adult Health

## 2022-04-12 ENCOUNTER — Non-Acute Institutional Stay (SKILLED_NURSING_FACILITY): Payer: Medicare Other | Admitting: Adult Health

## 2022-04-12 DIAGNOSIS — J441 Chronic obstructive pulmonary disease with (acute) exacerbation: Secondary | ICD-10-CM | POA: Diagnosis not present

## 2022-04-12 DIAGNOSIS — N1831 Chronic kidney disease, stage 3a: Secondary | ICD-10-CM

## 2022-04-12 DIAGNOSIS — H353130 Nonexudative age-related macular degeneration, bilateral, stage unspecified: Secondary | ICD-10-CM | POA: Diagnosis not present

## 2022-04-12 DIAGNOSIS — I495 Sick sinus syndrome: Secondary | ICD-10-CM | POA: Diagnosis not present

## 2022-04-12 DIAGNOSIS — I5032 Chronic diastolic (congestive) heart failure: Secondary | ICD-10-CM | POA: Diagnosis not present

## 2022-04-12 DIAGNOSIS — K219 Gastro-esophageal reflux disease without esophagitis: Secondary | ICD-10-CM | POA: Diagnosis not present

## 2022-04-12 DIAGNOSIS — I4821 Permanent atrial fibrillation: Secondary | ICD-10-CM

## 2022-04-12 DIAGNOSIS — E039 Hypothyroidism, unspecified: Secondary | ICD-10-CM

## 2022-04-12 DIAGNOSIS — H903 Sensorineural hearing loss, bilateral: Secondary | ICD-10-CM

## 2022-04-12 DIAGNOSIS — I10 Essential (primary) hypertension: Secondary | ICD-10-CM

## 2022-04-12 MED ORDER — TORSEMIDE 20 MG PO TABS: 30.0000 mg | ORAL_TABLET | Freq: Every day | ORAL | 0 refills | Status: DC

## 2022-04-12 MED ORDER — LEVALBUTEROL HCL 0.63 MG/3ML IN NEBU: 0.6300 mg | INHALATION_SOLUTION | Freq: Three times a day (TID) | RESPIRATORY_TRACT | 12 refills | Status: DC

## 2022-04-12 NOTE — Progress Notes (Signed)
Location:   [facility] Room Number: 151-A Place of Service:  SNF ([id number]) Provider:  Royal Hawthorn, NP  PCP: [name], MD  Patient Care Team: [name], MD as PCP - General (Internal Medicine) Jerline Pain, MD as PCP - Cardiology (Cardiology) Thompson Grayer, MD as Consulting Physician (Cardiology) Deneise Lever, MD as Consulting Physician (Pulmonary Disease) Rutherford Guys, MD as Consulting Physician (Ophthalmology) [name], MD as Consulting Physician (Gastroenterology) Gaynelle Arabian, MD as Consulting Physician (Orthopedic Surgery)  Extended Emergency Contact Information Primary Emergency Contact: [name] of [place] Phone: [phone] Mobile Phone: [phone] Relation: Daughter Secondary Emergency Contact: [name] of [place] Phone: [phone] Mobile Phone: [phone] Relation: Son  Code Status:  DNR Goals of care: Advanced Directive information    04/12/2022    8:52 AM  Advanced Directives  Does Patient Have a Medical Advance Directive? Yes  Type of Paramedic of [place];Living will;Out of facility DNR (pink MOST or yellow form)  Does patient want to make changes to medical advance directive? No - Patient declined  Copy of [place] in Chart? Yes - validated most recent copy scanned in chart (See row information)     Chief Complaint  Patient presents with   [facility] follow up in skilled care.     HPI:  Pt is a 89 y.o. female seen today for an acute visit for hospitalization follow up. She resides at [facility] in independent living but is now in skilled care for therapy.  PMH significant for pacemaker due to SSS, afib, HTN, chronic diastolic CHF, COPD, chronic bronchitis, GERD, OA, CKD, HOH, glaucoma, macular degeneration, blindness, constipation. Hospitalization 04/08/22-04/11/22 with coughing  and wheezing. CXR 04/08/22 showed cardiomegaly with probable retrocardiac atelectasis. She was admitted for chronic bronchitis exacerbation. She was treated with increased steroid dosing, zithromax, inhaled steroids, bronchodilators, and pulmonary toilet. Did have some hypokalemia which was repleted. Mild leukocytosis which resolved.  She is still having some wheezing this morning in rehab and received a xopenex neb with some relief. Sats are WNL on RA. No fever.  Has chronic cough with sputum production which is slightly worse.       Past Medical History:  Diagnosis Date   Anemia    Aortic stenosis    a. Echo 09/06/12 EF 55-60%, no WMA, G2DD, Ao valve sclerosis w/ mod stenosis, LA mildly dilated, PA pressure 76mHg   Asthmatic bronchitis    Complete heart block (HCC)    s/p permanent pacemaker 06/27/1999 (Battery change 06/2007 and 2016).  s/p AV nodal ablation by Dr ARayann Heman2016.   COPD (chronic obstructive pulmonary disease) (HCC)    Dr. YAnnamaria Boots  DDD (degenerative disc disease)    Diastolic dysfunction    a. Echo 09/06/12 EF 55-60%, no WMA, G2DD, Ao valve sclerosis w/ mod stenosis, LA mildly dilated, PA pressure 450mg   Diverticulosis    DVT (deep vein thrombosis) in pregnancy 1954   a. LLE   Hypertension    Hypothyroidism    on medication   Macular degeneration    Dr. KuEliezer Bottom Osteoarthrosis, unspecified whether generalized or localized, other specified sites    PAF (paroxysmal atrial fibrillation) ([facility] [place]   Stopped flecainide, on amiodarone but still has bouts of A FIB (mostly in mornings).    Pure hypercholesterolemia    Staghorn calculus    Left   Past Surgical  History:  Procedure Laterality Date   APPENDECTOMY  ~ 1941   AV NODE ABLATION  05/10/2014   AV NODE ABLATION N/A 05/10/2014   Procedure: AV NODE ABLATION;  Surgeon: Thompson Grayer, MD;  Location[facility] C[facility] LAB;  Service: Cardiovascular;  Laterality: N/A;   BUNIONECTOMY WITH HAMMERTOE RECONSTRUCTION Bilateral ~ [place]  06/27/99   Medtronic PM implanted by Dr [name]   CARDIOVERSION N/A 12/02/2013   Procedure: CARDIOVERSION;  Surgeon: [name], MD;  Location: [place];  Service: Cardiovascular;  Laterality: N/A;   CATARACT EXTRACTION W/ INTRAOCULAR LENS  IMPLANT, BILATERAL Bilateral    COLONOSCOPY WITH PROPOFOL N/A 04/22/2013   Procedure: COLONOSCOPY WITH PROPOFOL;  Surgeon: [name], MD;  Location: [facility] ENDOSCOPY;  Service: Endoscopy;  Laterality: N/A;   DILATION AND CURETTAGE OF UTERUS  X 2   "when I was going thru menopause"   ESOPHAGOGASTRODUODENOSCOPY (EGD) WITH PROPOFOL N/A 04/22/2013   Procedure: ESOPHAGOGASTRODUODENOSCOPY (EGD) WITH PROPOFOL;  Surgeon: [name], MD;  Location: [facility] ENDOSCOPY;  Service: Endoscopy;  Laterality: N/A;   HERNIA REPAIR     INCISIONAL HERNIA REPAIR     INSERT / REPLACE / REMOVE PACEMAKER  06/2007   "took out the old; put in new"   INSERT / REPLACE / REMOVE PACEMAKER  05/10/2014   MDT PPM generator change by Dr [name]   JOINT REPLACEMENT     PARTIAL NEPHRECTOMY Left 05/1974   stone disease   PERMANENT PACEMAKER GENERATOR CHANGE N/A 05/10/2014   Procedure: PERMANENT PACEMAKER GENERATOR CHANGE;  Surgeon: Thompson Grayer, MD;  Location: [facility] CATH LAB;  Service: Cardiovascular;  Laterality: N/A;   TONSILLECTOMY AND ADENOIDECTOMY  1930's   TOTAL KNEE ARTHROPLASTY Right 2001    Allergies  Allergen Reactions   Combigan [Brimonidine Tartrate-Timolol] Other (See Comments)    Systemic malaise amigen eye drop   Tape Other (See Comments)    SKIN IS VERY FRAGILE!!   Penicillins Hives   Breo Ellipta [Fluticasone Furoate-Vilanterol] Other (See Comments) and Hypertension    Increased blood pressure   Fluticasone    Other     SEASONAL ALLERGIES   Timolol    Vancomycin Other (See Comments)    Red man syndrome - Mild redness and discomfort. Able to complete initial dose.     Allergies as of 04/12/2022       Reactions   Combigan [brimonidine  Tartrate-timolol] Other (See Comments)   Systemic malaise amigen eye drop   Tape Other (See Comments)   SKIN IS VERY FRAGILE!!   Penicillins Hives   Breo Ellipta [fluticasone Furoate-vilanterol] Other (See Comments), Hypertension   Increased blood pressure   Fluticasone    Other    SEASONAL ALLERGIES   Timolol    Vancomycin Other (See Comments)   Red man syndrome - Mild redness and discomfort. Able to complete initial dose.         Medication List        Accurate as of April 12, 2022  8:53 AM. If you have any questions, ask your nurse or doctor.          albuterol 108 (90 Base) MCG/ACT inhaler Commonly known as: VENTOLIN HFA Inhale 2 puffs into the lungs every 6 (six) hours as needed for wheezing or shortness of breath.   Azelastine HCl 137 MCG/SPRAY Soln USE 1 TO 2 SPRAYS INTO BOTH NOSTRILS TWICE DAILY.   azithromycin 250 MG tablet Commonly known as: ZITHROMAX Take 1 tablet (250 mg  total) by mouth daily for 2 days.   Calcium 600-10 MG-MCG Chew Chew 1 capsule by mouth daily.   diclofenac Sodium 1 % Gel Commonly known as: VOLTAREN Apply 4 g topically 4 (four) times daily.   dorzolamide 2 % ophthalmic solution Commonly known as: TRUSOPT Place 1 drop into both eyes 2 (two) times daily.   Dulera 200-5 MCG/ACT Aero Generic drug: mometasone-formoterol Inhale 2 puffs into the lungs 2 (two) times daily.   famotidine 40 MG tablet Commonly known as: Pepcid Take 1 tablet (40 mg total) by mouth at bedtime.   fluticasone 50 MCG/ACT nasal spray Commonly known as: FLONASE USE 2 SPRAYS EACH NOSTRIL ONCE A DAY AS NEEDED FOR ALLERGIES OR CONGESTION.   gabapentin 100 MG capsule Commonly known as: NEURONTIN Take 1 capsule (100 mg total) by mouth at bedtime.   guaiFENesin 600 MG 12 hr tablet Commonly known as: MUCINEX Take 1 tablet (600 mg total) by mouth 2 (two) times daily.   latanoprost 0.005 % ophthalmic solution Commonly known as: XALATAN Place 1 drop into both  eyes at bedtime.   levalbuterol 0.63 MG/3ML nebulizer solution Commonly known as: XOPENEX Take 0.63 mg by nebulization every 6 (six) hours as needed for wheezing or shortness of breath.   levothyroxine 75 MCG tablet Commonly known as: SYNTHROID Take 1 tablet (75 mcg total) by mouth daily.   loratadine 10 MG tablet Commonly known as: CLARITIN Take 1 tablet (10 mg total) by mouth daily.   losartan 50 MG tablet Commonly known as: COZAAR Take 1 tablet (50 mg total) by mouth daily.   Lutein 20 MG Caps Take 1 capsule (20 mg total) by mouth daily.   Magnesium 500 MG Tabs Take 750 mg by mouth See admin instructions. Take 750 mg by mouth every evening (in conjunction with 3 BioComplete capsules)   metoprolol tartrate 50 MG tablet Commonly known as: LOPRESSOR Take 1.5 tablets (75 mg total) by mouth in the morning.   montelukast 10 MG tablet Commonly known as: SINGULAIR Take 1 tablet (10 mg total) by mouth daily.   NON FORMULARY Take 3 capsules by mouth See admin instructions. Gundry MD Bio Complete 3 - Prebiotic, Probiotic, Postbiotic to Support Optimal Gut Health capsules- TAKE 3 CAPSULES BY MOUTH EVERY EVENING (in conjunction with 750 mg Magnesium)   potassium chloride 10 MEQ tablet Commonly known as: KLOR-CON Take 2 tablets (20 mEq total) by mouth daily.   predniSONE 10 MG tablet Commonly known as: DELTASONE 2 tabs daily for 3 days  1 tabs daily for 3 days What changed: Another medication with the same name was removed. Continue taking this medication, and follow the directions you see here. Changed by: Royal Hawthorn, NP   PreserVision AREDS 2 Caps Take 1 capsule by mouth in the morning and at bedtime.   Rivaroxaban 15 MG Tabs tablet Commonly known as: Xarelto TAKE 1 TABLET BY MOUTH  DAILY WITH SUPPER   sennosides-docusate sodium 8.6-50 MG tablet Commonly known as: SENOKOT-S Take 2 tablets by mouth at bedtime.   torsemide 20 MG tablet Commonly known as:  DEMADEX Take 1 tablet (20 mg total) by mouth daily as needed. 3 lb weight gain in 1 day or 5 lb in 1 week or excessive edema What changed: Another medication with the same name was removed. Continue taking this medication, and follow the directions you see here. Changed by: Royal Hawthorn, NP   TYLENOL 500 MG tablet Generic drug: acetaminophen Take 500 mg by mouth in the morning.  Vitamin D 125 MCG (5000 UT) Caps Take 1 capsule by mouth daily.        Review of Systems  Constitutional:  Positive for activity change. Negative for appetite change, chills, diaphoresis, fatigue, fever and unexpected weight change.  HENT:  Positive for congestion and hearing loss. Negative for nosebleeds, postnasal drip, sinus pressure, sore throat and trouble swallowing.   Respiratory:  Positive for cough, shortness of breath (on exertion) and wheezing.   Cardiovascular:  Positive for leg swelling. Negative for chest pain and palpitations.  Gastrointestinal:  Positive for constipation. Negative for abdominal distention, abdominal pain and diarrhea.  Genitourinary:  Negative for difficulty urinating and dysuria.  Musculoskeletal:  Positive for gait problem. Negative for arthralgias, back pain, joint swelling and myalgias.  Neurological:  Negative for dizziness, tremors, seizures, syncope, facial asymmetry, speech difficulty, weakness, light-headedness, numbness and headaches.  Psychiatric/Behavioral:  Negative for agitation, behavioral problems and confusion.     Immunization History  Administered Date(s) Administered   COVID-19, mRNA, vaccine(Comirnaty)12 years and older 01/28/2022   Influenza Split 01/07/2011   Influenza Whole 01/16/2009, 01/23/2010   Influenza, High Dose Seasonal PF 01/06/2013, 01/15/2019, 02/04/2020   Influenza,inj,Quad PF,6+ Mos 02/17/2015, 01/30/2018, 01/07/2022   Influenza,inj,quad, With Preservative 01/06/2017   Influenza-Unspecified 01/06/2014, 02/01/2016, 02/04/2020,  01/26/2021   Moderna Covid-19 Vaccine Bivalent Booster 25yr & up 08/09/2021   Moderna SARS-COV2 Booster Vaccination 08/18/2020   Moderna Sars-Covid-2 Vaccination 04/20/2019, 05/18/2019, 02/22/2020   Pneumococcal Conjugate-13 02/24/2019   Pneumococcal Polysaccharide-23 11/23/2013   Respiratory Syncytial Virus Vaccine,Recomb Aduvanted(Arexvy) 01/07/2022   Tdap 02/24/2019   Zoster Recombinat (Shingrix) 03/13/2017, 05/20/2017   Pertinent  Health Maintenance Due  Topic Date Due   INFLUENZA VACCINE  Completed   DEXA SCAN  Discontinued      04/09/2022   11:00 AM 04/10/2022    3:00 AM 04/10/2022    7:28 AM 04/10/2022    9:00 PM 04/11/2022   10:00 AM  Fall Risk  Patient Fall Risk Level Moderate fall risk Moderate fall risk Moderate fall risk High fall risk High fall risk   Functional Status Survey:    Vitals:   04/12/22 0837  BP: (!) 148/70  Pulse: 60  Resp: 20  Temp: 97.7 F (36.5 C)  SpO2: 95%  Weight: 130 lb 12.8 oz (59.3 kg)  Height: '5\' 3"'$  (1.6 m)   Body mass index is 23.17 kg/m. Wt Readings from Last 3 Encounters:  04/12/22 130 lb 12.8 oz (59.3 kg)  04/08/22 130 lb (59 kg)  03/15/22 132 lb (59.9 kg)    Physical Exam Vitals and nursing note reviewed.  Constitutional:      General: She is not in acute distress.    Appearance: She is not diaphoretic.  HENT:     Head: Normocephalic and atraumatic.  Neck:     Vascular: No JVD.  Cardiovascular:     Rate and Rhythm: Normal rate and regular rhythm.     Heart sounds: No murmur heard. Pulmonary:     Effort: Pulmonary effort is normal. No respiratory distress.     Breath sounds: Wheezing present.     Comments: Slight increased wob.  Abdominal:     General: Bowel sounds are normal. There is distension (mild).     Palpations: Abdomen is soft. There is no mass.     Tenderness: There is no abdominal tenderness. There is no guarding.  Musculoskeletal:     Comments: Trace edema bilateral  Skin:    General: Skin is warm and  dry.  Neurological:     Mental Status: She is alert and oriented to person, place, and time.     Labs reviewed: Recent Labs    04/14/21 0357 04/17/21 0301 04/18/21 0354 04/23/21 0000 06/28/21 0000 11/15/21 1531 03/21/22 0000 04/08/22 1045 04/09/22 0501  NA 138   < > 141   < > 145  145   < > 141 139 136  K 3.9   < > 3.8   < > 4.0  4.0   < > 3.8 3.0* 3.6  CL 105   < > 107   < > 101  101   < > 102 101 101  CO2 26   < > 25   < > 30*  30*   < > 30* 26 24  GLUCOSE 154*   < > 83  --   --   --   --  142* 196*  BUN 19   < > 30*   < > 25*  25*   < > 38* 39* 39*  CREATININE 0.76   < > 1.04*   < > 1.0  1.0   < > 1.4* 1.43* 1.39*  CALCIUM 9.5   < > 9.1   < > 10.1  10.1   < > 9.6 8.9 8.8*  MG 2.2  --  2.5*  --  2.6  2.6  --   --   --   --    < > = values in this interval not displayed.   Recent Labs    11/15/21 1531 03/21/22 0000 04/08/22 1045  AST 35 34 22  ALT 41* 37* 15  ALKPHOS 60 53 42  BILITOT  --   --  1.4*  PROT  --   --  6.2*  ALBUMIN 4.4 4.1 4.0   Recent Labs    04/17/21 0301 04/23/21 0000 11/15/21 1531 04/08/22 1045 04/09/22 0501  WBC 11.5*   < > 6.9 12.8* 9.8  NEUTROABS  --   --   --  8.7*  --   HGB 12.5   < > 13.1 12.0 11.5*  HCT 38.9   < > 39 38.4 36.7  MCV 97.5  --   --  100.0 100.3*  PLT 407*   < > 249 223 217   < > = values in this interval not displayed.   Lab Results  Component Value Date   TSH 2.57 11/15/2021   Lab Results  Component Value Date   HGBA1C 5.7 (H) 06/25/2017   Lab Results  Component Value Date   CHOL 138 11/04/2017   HDL 70 11/04/2017   LDLCALC 58 11/04/2017   TRIG 53 11/04/2017   CHOLHDL 3.1 06/25/2017    Significant Diagnostic Results in last 30 days:  DG Chest Port 1 View  Result Date: 04/08/2022 CLINICAL DATA:  Cough, wheezing EXAM: PORTABLE CHEST 1 VIEW COMPARISON:  11/28/2021 FINDINGS: Cardiomegaly. Left chest multi lead pacer. Probable retrocardiac atelectasis. The visualized skeletal structures are  unremarkable. IMPRESSION: Cardiomegaly with probable retrocardiac atelectasis. Electronically Signed   By: Delanna Ahmadi M.D.   On: 04/08/2022 11:25    Assessment/Plan  1. COPD with acute exacerbation ([place]) Continues to have some wheezing Add xopenex scheduled Continue Dulera Continue pulmonary toilet.  PT and PT eval and tx On prednisone taper, maintenance dose is 5 mg  Complete course of zithromax. She tends to do better with doxycycline, if not improving would need to re address.    2. Chronic diastolic congestive heart  failure (HCC) Weight is stable Continue torsemide 30 mg daily and prn weight gain On ARB therapy.    3. Sick sinus syndrome [facility]) S/p pacemaker   4. Permanent atrial fibrillation (HCC) On xarelto for CVA risk reduction Rate is controlled on lopressor once daily, may need bid dosing. Will monitor HR while in rehab.   5. Essential hypertension Controlled   6. Gastroesophageal reflux disease, unspecified whether esophagitis present Continue 40 mg of pepcid at bedtime.   7. Acquired hypothyroidism Lab Results  Component Value Date   TSH 2.57 11/15/2021  Continue Synthroid    8. Stage 3a chronic kidney disease (HCC) Continue to periodically monitor BMP and avoid nephrotoxic agents   9. Bilateral nonexudative age-related macular degeneration, unspecified stage Blind, needs caregivers during the day   10. Sensorineural hearing loss (SNHL) of both ears Wears hearing aides.    Labs/tests ordered:   CBC BMP Monday 1/8

## 2022-04-14 LAB — CULTURE, BLOOD (ROUTINE X 2)
Culture: NO GROWTH
Culture: NO GROWTH
Special Requests: ADEQUATE

## 2022-04-15 ENCOUNTER — Encounter: Payer: Self-pay | Admitting: Internal Medicine

## 2022-04-15 ENCOUNTER — Non-Acute Institutional Stay: Payer: Medicare Other | Admitting: Family Medicine

## 2022-04-15 ENCOUNTER — Telehealth: Payer: Self-pay | Admitting: Family Medicine

## 2022-04-15 ENCOUNTER — Non-Acute Institutional Stay (SKILLED_NURSING_FACILITY): Payer: Medicare Other | Admitting: Internal Medicine

## 2022-04-15 DIAGNOSIS — H903 Sensorineural hearing loss, bilateral: Secondary | ICD-10-CM

## 2022-04-15 DIAGNOSIS — I10 Essential (primary) hypertension: Secondary | ICD-10-CM

## 2022-04-15 DIAGNOSIS — R55 Syncope and collapse: Secondary | ICD-10-CM | POA: Diagnosis not present

## 2022-04-15 DIAGNOSIS — H353130 Nonexudative age-related macular degeneration, bilateral, stage unspecified: Secondary | ICD-10-CM

## 2022-04-15 DIAGNOSIS — K219 Gastro-esophageal reflux disease without esophagitis: Secondary | ICD-10-CM

## 2022-04-15 DIAGNOSIS — J189 Pneumonia, unspecified organism: Secondary | ICD-10-CM | POA: Diagnosis not present

## 2022-04-15 DIAGNOSIS — J441 Chronic obstructive pulmonary disease with (acute) exacerbation: Secondary | ICD-10-CM

## 2022-04-15 DIAGNOSIS — E039 Hypothyroidism, unspecified: Secondary | ICD-10-CM | POA: Diagnosis not present

## 2022-04-15 DIAGNOSIS — N1831 Chronic kidney disease, stage 3a: Secondary | ICD-10-CM | POA: Diagnosis not present

## 2022-04-15 DIAGNOSIS — G629 Polyneuropathy, unspecified: Secondary | ICD-10-CM | POA: Diagnosis not present

## 2022-04-15 DIAGNOSIS — I4821 Permanent atrial fibrillation: Secondary | ICD-10-CM | POA: Diagnosis not present

## 2022-04-15 DIAGNOSIS — I495 Sick sinus syndrome: Secondary | ICD-10-CM

## 2022-04-15 DIAGNOSIS — I5032 Chronic diastolic (congestive) heart failure: Secondary | ICD-10-CM | POA: Diagnosis not present

## 2022-04-15 LAB — CBC AND DIFFERENTIAL
HCT: 37 (ref 36–46)
HCT: 37 (ref 36–46)
Hemoglobin: 12 (ref 12.0–16.0)
Hemoglobin: 12 (ref 12.0–16.0)
Neutrophils Absolute: 8
Platelets: 330 10*3/uL (ref 150–400)
Platelets: 330 10*3/uL (ref 150–400)
WBC: 10.8
WBC: 10.8

## 2022-04-15 LAB — BASIC METABOLIC PANEL
BUN: 29 — AB (ref 4–21)
CO2: 32 — AB (ref 13–22)
Chloride: 101 (ref 99–108)
Creatinine: 1.2 — AB (ref 0.5–1.1)
Glucose: 84
Potassium: 3.6 mEq/L (ref 3.5–5.1)
Sodium: 143 (ref 137–147)

## 2022-04-15 LAB — CBC
RBC: 3.81 — AB (ref 3.87–5.11)
RBC: 3.81 — AB (ref 3.87–5.11)

## 2022-04-15 LAB — COMPREHENSIVE METABOLIC PANEL
Calcium: 9.5 (ref 8.7–10.7)
eGFR: 42

## 2022-04-15 NOTE — Telephone Encounter (Signed)
Saw Dr Lyndel Safe on the unit when present to see patient. She states that the patient does not require Palliative Care as she is followed closely in their clinic. Damaris Hippo FNP-C

## 2022-04-15 NOTE — Progress Notes (Signed)
Provider:  Lavinia Sharps Location:  [place] [place] Room Number: 151-A Place of Service:  SNF (31)  PCP: [name], MD Patient Care Team: [name], MD as PCP - General (Internal Medicine) Jerline Pain, MD as PCP - Cardiology (Cardiology) Thompson Grayer, MD as Consulting Physician (Cardiology) Deneise Lever, MD as Consulting Physician (Pulmonary Disease) Rutherford Guys, MD as Consulting Physician (Ophthalmology) [name], MD as Consulting Physician (Gastroenterology) Gaynelle Arabian, MD as Consulting Physician (Orthopedic Surgery)  Extended Emergency Contact Information Primary Emergency Contact: [name] of [place] Phone: [phone] Mobile Phone: [phone] Relation: Daughter Secondary Emergency Contact: [name] of [place] Phone: [phone] Mobile Phone: [phone] Relation: Son  Code Status: DNR Goals of Care: Advanced Directive information    04/18/2022    9:50 AM  Advanced Directives  Does Patient Have a Medical Advance Directive? Yes  Type of Paramedic of [place];Living will;Out of facility DNR (pink MOST or yellow form)  Does patient want to make changes to medical advance directive? No - Patient declined  Copy of [place] in Chart? Yes - validated most recent copy scanned in chart (See row information)  Pre-existing out of facility DNR order (yellow form or pink MOST form) Pink MOST form placed in chart (order not valid for inpatient use);Yellow form placed in chart (order not valid for inpatient use)      Chief Complaint  Patient presents with   New Admit To SNF    HPI: Patient is a 89 y.o. female seen today for Readmission to SNF  Admitted to the hospital from 01/01-01/04 for COPD Exacerbation and CAP  Lives in [place] in [place]   Patient has h/o A. fib on Xarelto s/p AV nodal ablation, s/p PPM in  2009 Aortic stenosis  COPD with frequent exacerbations.  Not on oxygen.  On chronic prednisone.  Follows closely with Dr. [name].  Is enrolled in palliative care Chronic diastolic CHF  History of chronic venous insufficiency Vision loss due to macular degeneration and glaucoma Very HOH Osteopenia  Went to ED for SOB and Cough with Wheezing Chest Xray showed Retrocardiac Opacities Treated with Antibiotics Bronchodilator and steroids  Today c/o Feeling SOB and Cough  No Fever or chest pain  POX 94 % on RA    Past Medical History:  Diagnosis Date   Anemia    Aortic stenosis    a. Echo 09/06/12 EF 55-60%, no WMA, G2DD, Ao valve sclerosis w/ mod stenosis, LA mildly dilated, PA pressure 72mHg   Asthmatic bronchitis    Complete heart block (HCC)    s/p permanent pacemaker 06/27/1999 (Battery change 06/2007 and 2016).  s/p AV nodal ablation by Dr ARayann Heman2016.   COPD (chronic obstructive pulmonary disease) (HCC)    Dr. YAnnamaria Boots  DDD (degenerative disc disease)    Diastolic dysfunction    a. Echo 09/06/12 EF 55-60%, no WMA, G2DD, Ao valve sclerosis w/ mod stenosis, LA mildly dilated, PA pressure 482mg   Diverticulosis    DVT (deep vein thrombosis) in pregnancy 1954   a. LLE   Hypertension    Hypothyroidism    on medication   Macular degeneration    Dr. KuEliezer Bottom Osteoarthrosis, unspecified whether generalized or localized, other specified sites    PAF ([place]oxysmal [place] fibrillation) ([place]   Stopped flecainide, on amiodarone but still has bouts of A FIB (mostly in mornings).    Pure  hypercholesterolemia    Staghorn calculus    Left   Past Surgical History:  Procedure Laterality Date   APPENDECTOMY  ~ 1941   AV NODE ABLATION  05/10/2014   AV NODE ABLATION N/A 05/10/2014   Procedure: AV NODE ABLATION;  Surgeon: Thompson Grayer, MD;  Location: [facility] CATH LAB;  Service: Cardiovascular;  Laterality: N/A;   BUNIONECTOMY WITH HAMMERTOE RECONSTRUCTION Bilateral ~ [place]   06/27/99   Medtronic PM implanted by Dr [name]   CARDIOVERSION N/A 12/02/2013   Procedure: CARDIOVERSION;  Surgeon: [name], MD;  Location: [facility];  Service: Cardiovascular;  Laterality: N/A;   CATARACT EXTRACTION W/ INTRAOCULAR LENS  IMPLANT, BILATERAL Bilateral    COLONOSCOPY WITH PROPOFOL N/A 04/22/2013   Procedure: COLONOSCOPY WITH PROPOFOL;  Surgeon: [name], MD;  Location: [facility] ENDOSCOPY;  Service: Endoscopy;  Laterality: N/A;   DILATION AND CURETTAGE OF UTERUS  X 2   "when I was going thru menopause"   ESOPHAGOGASTRODUODENOSCOPY (EGD) WITH PROPOFOL N/A 04/22/2013   Procedure: ESOPHAGOGASTRODUODENOSCOPY (EGD) WITH PROPOFOL;  Surgeon: [name], MD;  Location: [facility] ENDOSCOPY;  Service: Endoscopy;  Laterality: N/A;   HERNIA REPAIR     INCISIONAL HERNIA REPAIR     INSERT / REPLACE / REMOVE PACEMAKER  06/2007   "took out the old; put in new"   INSERT / REPLACE / REMOVE PACEMAKER  05/10/2014   MDT PPM generator change by Dr [name]   JOINT REPLACEMENT     PARTIAL NEPHRECTOMY Left 05/1974   stone disease   PERMANENT PACEMAKER GENERATOR CHANGE N/A 05/10/2014   Procedure: PERMANENT PACEMAKER GENERATOR CHANGE;  Surgeon: Thompson Grayer, MD;  Location: [facility] CATH LAB;  Service: Cardiovascular;  Laterality: N/A;   TONSILLECTOMY AND ADENOIDECTOMY  1930's   TOTAL KNEE ARTHROPLASTY Right 2001    reports that she has never smoked. She has never used smokeless tobacco. She reports current alcohol use of about 7.0 standard drinks of alcohol per week. She reports that she does not use drugs. Social History   Socioeconomic History   Marital status: Widowed    Spouse name: Not on file   Number of children: 2   Years of education: Not on file   Highest education level: Not on file  Occupational History   Occupation: RETIRED    Employer: RETIRED    Comment: Family [employer] business  Tobacco Use   Smoking status: Never   Smokeless tobacco: Never  Vaping Use   Vaping Use: Never used   Substance and Sexual Activity   Alcohol use: Yes    Alcohol/week: 7.0 standard drinks of alcohol    Types: 7 Glasses of wine per week    Comment: occasionally   Drug use: No   Sexual activity: Not Currently    Partners: Male  Other Topics Concern   Not on file  Social History Narrative   Diet? Low salt, low fat      Do you drink/eat things with caffeine? yes      Marital status?                 married                   What year were you married? 1949      Do you live in a house, apartment, assisted living, condo, trailer, etc.? apartment      Is it one or more stories? 3 stories      How  many persons live in your home? Just me      Do you have any pets in your home? (please list) no      Current or past profession: accounting      Do you exercise?          yes                            Type & how often? Walk, class, prescribed daily      Do you have a living will? yes      Do you have a DNR form?     yes                             If not, do you want to discuss one?      Do you have signed POA/HPOA for forms? no         Social Determinants of Health   Financial Resource Strain: Low Risk  (02/16/2021)   Overall Financial Resource Strain (CARDIA)    Difficulty of Paying Living Expenses: Not hard at all  Food Insecurity: No Food Insecurity (04/08/2022)   Hunger Vital Sign    Worried About Running Out of Food in the Last Year: Never true    Ran Out of Food in the Last Year: Never true  Transportation Needs: No Transportation Needs (04/08/2022)   PRAPARE - Hydrologist (Medical): No    Lack of Transportation (Non-Medical): No  Physical Activity: Sufficiently Active (02/16/2021)   Exercise Vital Sign    Days of Exercise per Week: 7 days    Minutes of Exercise per Session: 30 min  Stress: No Stress Concern Present (02/16/2021)   House    Feeling of Stress : Not at all   Social Connections: Moderately Isolated (02/16/2021)   Social Connection and Isolation Panel [NHANES]    Frequency of Communication with Friends and Family: More than three times a week    Frequency of Social Gatherings with Friends and Family: More than three times a week    Attends Religious Services: Never    Marine scientist or Organizations: Yes    Attends Music therapist: More than 4 times per year    Marital Status: Widowed  Intimate Partner Violence: Not At Risk (04/08/2022)   Humiliation, Afraid, Rape, and Kick questionnaire    Fear of Current or Ex-Partner: No    Emotionally Abused: No    Physically Abused: No    Sexually Abused: No    Functional Status Survey:    Family History  Problem Relation Age of Onset   Malignant hypertension Father    Hypertension Father    Renal Disease Father    Breast cancer Mother    Heart attack Brother    Stroke Brother     Health Maintenance  Topic Date Due   Medicare Annual Wellness (AWV)  03/19/2023   DTaP/Tdap/Td (2 - Td or Tdap) 02/23/2029   Pneumonia Vaccine 47+ Years old  Completed   INFLUENZA VACCINE  Completed   COVID-19 Vaccine  Completed   Zoster Vaccines- Shingrix  Completed   HPV VACCINES  Aged Out   DEXA SCAN  Discontinued    Allergies  Allergen Reactions   Combigan [Brimonidine Tartrate-Timolol] Other (See Comments)    Systemic malaise amigen eye drop  Tape Other (See Comments)    SKIN IS VERY FRAGILE!!   Penicillins Hives   Breo Ellipta [Fluticasone Furoate-Vilanterol] Other (See Comments) and Hypertension    Increased blood pressure   Fluticasone    Other     SEASONAL ALLERGIES   Timolol    Vancomycin Other (See Comments)    Red man syndrome - Mild redness and discomfort. Able to complete initial dose.     Outpatient Encounter Medications as of 04/15/2022  Medication Sig   albuterol (VENTOLIN HFA) 108 (90 Base) MCG/ACT inhaler Inhale 2 puffs into the lungs every 6 (six) hours as  needed for wheezing or shortness of breath.   Azelastine HCl 137 MCG/SPRAY SOLN USE 1 TO 2 SPRAYS INTO BOTH NOSTRILS TWICE DAILY.   Calcium 600-10 MG-MCG CHEW Chew 1 capsule by mouth daily.   Cholecalciferol (VITAMIN D) 125 MCG (5000 UT) CAPS Take 1 capsule by mouth daily.   diclofenac Sodium (VOLTAREN) 1 % GEL Apply 4 g topically 4 (four) times daily.   dorzolamide (TRUSOPT) 2 % ophthalmic solution Place 1 drop into both eyes 2 (two) times daily.   DULERA 200-5 MCG/ACT AERO Inhale 2 puffs into the lungs 2 (two) times daily.   famotidine (PEPCID) 40 MG tablet Take 1 tablet (40 mg total) by mouth at bedtime.   fluticasone (FLONASE) 50 MCG/ACT nasal spray USE 2 SPRAYS EACH NOSTRIL ONCE A DAY AS NEEDED FOR ALLERGIES OR CONGESTION.   gabapentin (NEURONTIN) 100 MG capsule Take 1 capsule (100 mg total) by mouth at bedtime.   guaiFENesin (MUCINEX) 600 MG 12 hr tablet Take 1 tablet (600 mg total) by mouth 2 (two) times daily.   latanoprost (XALATAN) 0.005 % ophthalmic solution Place 1 drop into both eyes at bedtime.   levalbuterol (XOPENEX) 0.63 MG/3ML nebulizer solution Take 0.63 mg by nebulization every 6 (six) hours as needed for wheezing or shortness of breath.   levothyroxine (SYNTHROID) 75 MCG tablet Take 1 tablet (75 mcg total) by mouth daily.   loratadine (CLARITIN) 10 MG tablet Take 1 tablet (10 mg total) by mouth daily.   losartan (COZAAR) 50 MG tablet Take 1 tablet (50 mg total) by mouth daily.   Lutein 20 MG CAPS Take 1 capsule (20 mg total) by mouth daily.   Magnesium 500 MG TABS Take 750 mg by mouth See admin instructions. Take 750 mg by mouth every evening (in conjunction with 3 BioComplete capsules)   metoprolol tartrate (LOPRESSOR) 50 MG tablet Take 1.5 tablets (75 mg total) by mouth in the morning.   montelukast (SINGULAIR) 10 MG tablet Take 1 tablet (10 mg total) by mouth daily.   Multiple Vitamins-Minerals (PRESERVISION AREDS 2) CAPS Take 1 capsule by mouth in the morning and at  bedtime.   NON FORMULARY Take 3 capsules by mouth See admin instructions. Gundry MD Bio Complete 3 - Prebiotic, Probiotic, Postbiotic to Support Optimal Gut Health capsules- TAKE 3 CAPSULES BY MOUTH EVERY EVENING (in conjunction with 750 mg Magnesium)   potassium chloride (KLOR-CON) 10 MEQ tablet Take 2 tablets (20 mEq total) by mouth daily.   Rivaroxaban (XARELTO) 15 MG TABS tablet TAKE 1 TABLET BY MOUTH  DAILY WITH SUPPER   sennosides-docusate sodium (SENOKOT-S) 8.6-50 MG tablet Take 2 tablets by mouth at bedtime.   torsemide (DEMADEX) 20 MG tablet Take 1 tablet (20 mg total) by mouth daily as needed. 3 lb weight gain in 1 day or 5 lb in 1 week or excessive edema   torsemide (DEMADEX) 20 MG  tablet Take 1.5 tablets (30 mg total) by mouth daily.   TYLENOL 500 MG tablet Take 500 mg by mouth in the morning.   [DISCONTINUED] acetaminophen (TYLENOL) 325 MG tablet Take 650 mg by mouth every 4 (four) hours as needed.   [DISCONTINUED] predniSONE (DELTASONE) 10 MG tablet 2 tabs daily for 3 days  1 tabs daily for 3 days (Patient taking differently: 1 tabs daily for 3 days)   [DISCONTINUED] levalbuterol (XOPENEX) 0.63 MG/3ML nebulizer solution Take 3 mLs (0.63 mg total) by nebulization in the morning, at noon, and at bedtime for 3 days. (Patient not taking: Reported on 04/15/2022)   No facility-administered encounter medications on file as of 04/15/2022.    Review of Systems  Constitutional:  Positive for activity change. Negative for appetite change.  HENT: Negative.    Respiratory:  Positive for cough and shortness of breath.   Cardiovascular:  Negative for leg swelling.  Gastrointestinal:  Negative for constipation.  Genitourinary: Negative.   Musculoskeletal:  Negative for arthralgias, gait problem and myalgias.  Skin: Negative.   Neurological:  Positive for weakness. Negative for dizziness.  Psychiatric/Behavioral:  Negative for confusion, dysphoric mood and sleep disturbance.     Vitals:    04/15/22 1047  BP: 126/72  Pulse: 60  Resp: 17  Temp: (!) 97 F (36.1 C)  SpO2: 97%  Weight: 130 lb 12.8 oz (59.3 kg)  Height: '5\' 2"'$  (1.575 m)   Body mass index is 23.92 kg/m. Physical Exam Vitals reviewed.  Constitutional:      Appearance: Normal appearance.  HENT:     Head: Normocephalic.     Nose: Nose normal.     Mouth/Throat:     Mouth: Mucous membranes are moist.     Pharynx: Oropharynx is clear.  Eyes:     Pupils: Pupils are equal, round, and reactive to light.  Cardiovascular:     Rate and Rhythm: Normal rate and regular rhythm.     Pulses: Normal pulses.     Heart sounds: Normal heart sounds. No murmur heard. Pulmonary:     Effort: Pulmonary effort is normal.     Breath sounds: Wheezing present.  Abdominal:     General: Abdomen is flat. Bowel sounds are normal.     Palpations: Abdomen is soft.  Musculoskeletal:     Cervical back: Neck supple.     Comments: Mild edema Bilateral  Skin:    General: Skin is warm.  Neurological:     General: No focal deficit present.     Mental Status: She is alert and oriented to person, place, and time.  Psychiatric:        Mood and Affect: Mood normal.        Thought Content: Thought content normal.     Labs reviewed: Basic Metabolic Panel: Recent Labs    06/28/21 0000 11/15/21 1531 04/08/22 1045 04/09/22 0501 04/15/22 0000  NA 145  145   < > 139 136 143  K 4.0  4.0   < > 3.0* 3.6 3.6  CL 101  101   < > 101 101 101  CO2 30*  30*   < > 26 24 32*  GLUCOSE  --   --  142* 196*  --   BUN 25*  25*   < > 39* 39* 29*  CREATININE 1.0  1.0   < > 1.43* 1.39* 1.2*  CALCIUM 10.1  10.1   < > 8.9 8.8* 9.5  MG 2.6  2.6  --   --   --   --    < > =  values in this interval not displayed.   Liver Function Tests: Recent Labs    11/15/21 1531 03/21/22 0000 04/08/22 1045  AST 35 34  34 22  ALT 41* 37*  37* 15  ALKPHOS 60 53  53 42  BILITOT  --   --  1.4*  PROT  --   --  6.2*  ALBUMIN 4.4 4.1  4.1 4.0   No  results for input(s): "LIPASE", "AMYLASE" in the last 8760 hours. No results for input(s): "AMMONIA" in the last 8760 hours. CBC: Recent Labs    04/08/22 1045 04/09/22 0501 04/15/22 0000  WBC 12.8* 9.8 10.8  NEUTROABS 8.7*  --  8.00  HGB 12.0 11.5* 12.0  HCT 38.4 36.7 37  MCV 100.0 100.3*  --   PLT 223 217 330   Cardiac Enzymes: No results for input(s): "CKTOTAL", "CKMB", "CKMBINDEX", "TROPONINI" in the last 8760 hours. BNP: Invalid input(s): "POCBNP" Lab Results  Component Value Date   HGBA1C 5.7 (H) 06/25/2017   Lab Results  Component Value Date   TSH 2.57 11/15/2021   Lab Results  Component Value Date   VITAMINB12 450 03/21/2022   VITAMINB12 450 03/21/2022   Lab Results  Component Value Date   FOLATE 18.2 04/13/2021   Lab Results  Component Value Date   IRON 36 04/13/2021   TIBC 196 (L) 04/13/2021   FERRITIN 269 04/13/2021    Imaging and Procedures obtained prior to SNF admission: DG Chest Port 1 View  Result Date: 04/08/2022 CLINICAL DATA:  Cough, wheezing EXAM: PORTABLE CHEST 1 VIEW COMPARISON:  11/28/2021 FINDINGS: Cardiomegaly. Left chest multi lead pacer. Probable retrocardiac atelectasis. The visualized skeletal structures are unremarkable. IMPRESSION: Cardiomegaly with probable retrocardiac atelectasis. Electronically Signed   By: Delanna Ahmadi M.D.   On: 04/08/2022 11:25    Assessment/Plan 1. COPD with acute exacerbation ([place]) Still Wheezing  Will restart on Prednisone taper 40 mg over 2 weeks Continue her other bronchodilators 2. Community acquired pneumonia  Just finished her antibiotics in the hospital  3. Sick sinus syndrome ([place])   4. Permanent atrial fibrillation (HCC) Xarelto and Lopressor  5. Chronic diastolic congestive heart failure (HCC) On Torsemide  6. Essential hypertension Cozaar and Lopressor  7. Gastroesophageal reflux disease, unspecified whether esophagitis present Pepcid  8. Acquired hypothyroidism TSH normal in  08/23  9. Stage 3a chronic kidney disease (HCC) Creat stable  10. Bilateral nonexudative age-related macular degeneration, unspecified stage Has Caregivers in her apartment  11. Sensorineural hearing loss (SNHL) of both ears     Family/ staff Communication:   Labs/tests ordered:

## 2022-04-16 DIAGNOSIS — R0602 Shortness of breath: Secondary | ICD-10-CM | POA: Diagnosis not present

## 2022-04-16 DIAGNOSIS — R278 Other lack of coordination: Secondary | ICD-10-CM | POA: Diagnosis not present

## 2022-04-16 DIAGNOSIS — R2681 Unsteadiness on feet: Secondary | ICD-10-CM | POA: Diagnosis not present

## 2022-04-16 DIAGNOSIS — R4189 Other symptoms and signs involving cognitive functions and awareness: Secondary | ICD-10-CM | POA: Diagnosis not present

## 2022-04-16 DIAGNOSIS — R2689 Other abnormalities of gait and mobility: Secondary | ICD-10-CM | POA: Diagnosis not present

## 2022-04-16 DIAGNOSIS — J418 Mixed simple and mucopurulent chronic bronchitis: Secondary | ICD-10-CM | POA: Diagnosis not present

## 2022-04-16 DIAGNOSIS — J410 Simple chronic bronchitis: Secondary | ICD-10-CM | POA: Diagnosis not present

## 2022-04-16 DIAGNOSIS — R41841 Cognitive communication deficit: Secondary | ICD-10-CM | POA: Diagnosis not present

## 2022-04-16 DIAGNOSIS — M6389 Disorders of muscle in diseases classified elsewhere, multiple sites: Secondary | ICD-10-CM | POA: Diagnosis not present

## 2022-04-16 DIAGNOSIS — J17 Pneumonia in diseases classified elsewhere: Secondary | ICD-10-CM | POA: Diagnosis not present

## 2022-04-16 DIAGNOSIS — R1314 Dysphagia, pharyngoesophageal phase: Secondary | ICD-10-CM | POA: Diagnosis not present

## 2022-04-16 DIAGNOSIS — R5383 Other fatigue: Secondary | ICD-10-CM | POA: Diagnosis not present

## 2022-04-17 DIAGNOSIS — M6389 Disorders of muscle in diseases classified elsewhere, multiple sites: Secondary | ICD-10-CM | POA: Diagnosis not present

## 2022-04-17 DIAGNOSIS — J418 Mixed simple and mucopurulent chronic bronchitis: Secondary | ICD-10-CM | POA: Diagnosis not present

## 2022-04-17 DIAGNOSIS — R4189 Other symptoms and signs involving cognitive functions and awareness: Secondary | ICD-10-CM | POA: Diagnosis not present

## 2022-04-17 DIAGNOSIS — J410 Simple chronic bronchitis: Secondary | ICD-10-CM | POA: Diagnosis not present

## 2022-04-17 DIAGNOSIS — R0602 Shortness of breath: Secondary | ICD-10-CM | POA: Diagnosis not present

## 2022-04-17 DIAGNOSIS — J17 Pneumonia in diseases classified elsewhere: Secondary | ICD-10-CM | POA: Diagnosis not present

## 2022-04-18 ENCOUNTER — Encounter: Payer: Self-pay | Admitting: Adult Health

## 2022-04-18 ENCOUNTER — Non-Acute Institutional Stay (SKILLED_NURSING_FACILITY): Payer: Medicare Other | Admitting: Adult Health

## 2022-04-18 DIAGNOSIS — R0602 Shortness of breath: Secondary | ICD-10-CM | POA: Diagnosis not present

## 2022-04-18 DIAGNOSIS — R4189 Other symptoms and signs involving cognitive functions and awareness: Secondary | ICD-10-CM | POA: Diagnosis not present

## 2022-04-18 DIAGNOSIS — M6389 Disorders of muscle in diseases classified elsewhere, multiple sites: Secondary | ICD-10-CM | POA: Diagnosis not present

## 2022-04-18 DIAGNOSIS — J418 Mixed simple and mucopurulent chronic bronchitis: Secondary | ICD-10-CM | POA: Diagnosis not present

## 2022-04-18 DIAGNOSIS — J441 Chronic obstructive pulmonary disease with (acute) exacerbation: Secondary | ICD-10-CM | POA: Diagnosis not present

## 2022-04-18 DIAGNOSIS — J410 Simple chronic bronchitis: Secondary | ICD-10-CM | POA: Diagnosis not present

## 2022-04-18 DIAGNOSIS — J17 Pneumonia in diseases classified elsewhere: Secondary | ICD-10-CM | POA: Diagnosis not present

## 2022-04-18 MED ORDER — LEVALBUTEROL HCL 0.63 MG/3ML IN NEBU: 0.6300 mg | INHALATION_SOLUTION | RESPIRATORY_TRACT | 12 refills | Status: DC

## 2022-04-18 MED ORDER — DOXYCYCLINE HYCLATE 100 MG PO TABS: 100.0000 mg | ORAL_TABLET | Freq: Two times a day (BID) | ORAL | 0 refills | Status: AC

## 2022-04-18 MED ORDER — IPRATROPIUM-ALBUTEROL 0.5-2.5 (3) MG/3ML IN SOLN: 3.0000 mL | RESPIRATORY_TRACT | 0 refills | Status: DC

## 2022-04-18 NOTE — Progress Notes (Addendum)
Location:   [place] Room Number: [place] of Service:  SNF [phone]) Provider:  Royal Hawthorn, NP  [name], MD  Patient Care Team: [name], MD as PCP - General (Internal Medicine) Jerline Pain, MD as PCP - Cardiology (Cardiology) Thompson Grayer, MD as Consulting Physician (Cardiology) Deneise Lever, MD as Consulting Physician (Pulmonary Disease) Rutherford Guys, MD as Consulting Physician (Ophthalmology) [name], MD as Consulting Physician (Gastroenterology) Gaynelle Arabian, MD as Consulting Physician (Orthopedic Surgery)  Extended Emergency Contact Information Primary Emergency Contact: [name] of [place] Phone: [phone] Mobile Phone: [phone] Relation: Daughter Secondary Emergency Contact: [name] of [place] Phone: [phone] Mobile Phone: [phone] Relation: Son  Code Status:  DNR Goals of care: Advanced Directive information    04/18/2022    9:50 AM  Advanced Directives  Does Patient Have a Medical Advance Directive? Yes  Type of Paramedic of [place];Living will;Out of facility DNR (pink MOST or yellow form)  Does patient want to make changes to medical advance directive? No - Patient declined  Copy of [place] in Chart? Yes - validated most recent copy scanned in chart (See row information)  Pre-existing out of facility DNR order (yellow form or pink MOST form) Pink MOST form placed in chart (order not valid for inpatient use);Yellow form placed in chart (order not valid for inpatient use)     Chief Complaint  Patient presents with   Acute Visit    Using PM nebulizers more    HPI:  Pt is a 89 y.o. female seen today for an acute visit for increased prn neg usage PMH significant for pacemaker due to SSS, afib, HTN, chronic diastolic CHF, COPD, chronic bronchitis, GERD, OA, CKD, HOH, glaucoma,  macular degeneration, blindness, constipation. Hospitalization 04/08/22-04/11/22 with coughing and wheezing. CXR 04/08/22 showed cardiomegaly with probable retrocardiac atelectasis. She was admitted for chronic bronchitis exacerbation. She was treated with increased steroid dosing, zithromax, inhaled steroids, bronchodilators, and pulmonary toilet. Did have some hypokalemia which was repleted. Mild leukocytosis which resolved.   She feels she is having more coughing, wheezing, and mucus production, particularly at night keeping her up. She reports zithromax has never worked for her and she does well with doxycycline.   She remains on prednisone taper which was extended on 04/15/22.  Currently on 30  mg daily. Using prn duoneb q 6 hrs.   Sats are WNL on room air. Some sob at rest and with exertion  No fever or purulent sputum   Weight unchanged, edema unchanged.   Past Medical History:  Diagnosis Date   Anemia    Aortic stenosis    a. Echo 09/06/12 EF 55-60%, no WMA, G2DD, Ao valve sclerosis w/ mod stenosis, LA mildly dilated, PA pressure 47mHg   Asthmatic bronchitis    Complete heart block (HCC)    s/p permanent pacemaker 06/27/1999 (Battery change 06/2007 and 2016).  s/p AV nodal ablation by Dr ARayann Heman2016.   COPD (chronic obstructive pulmonary disease) (HCC)    Dr. YAnnamaria Boots  DDD (degenerative disc disease)    Diastolic dysfunction    a. Echo 09/06/12 EF 55-60%, no WMA, G2DD, Ao valve sclerosis w/ mod stenosis, LA mildly dilated, PA pressure 466mg   Diverticulosis    DVT (deep vein thrombosis) in pregnancy 1954   a. LLE   Hypertension    Hypothyroidism    on medication   Macular  degeneration    Dr. [name]   Osteoarthrosis, unspecified whether generalized or localized, other specified sites    PAF (paroxysmal atrial fibrillation) ([place])    Stopped flecainide, on amiodarone but still has bouts of A FIB (mostly in mornings).    Pure hypercholesterolemia    Staghorn calculus    Left   Past  Surgical History:  Procedure Laterality Date   APPENDECTOMY  ~ 1941   AV NODE ABLATION  05/10/2014   AV NODE ABLATION N/A 05/10/2014   Procedure: AV NODE ABLATION;  Surgeon: Thompson Grayer, MD;  Location: [facility] CATH LAB;  Service: Cardiovascular;  Laterality: N/A;   BUNIONECTOMY WITH HAMMERTOE RECONSTRUCTION Bilateral ~ [place]  06/27/99   Medtronic PM implanted by Dr [name]   CARDIOVERSION N/A 12/02/2013   Procedure: CARDIOVERSION;  Surgeon: [name], MD;  Location: [place];  Service: Cardiovascular;  Laterality: N/A;   CATARACT EXTRACTION W/ INTRAOCULAR LENS  IMPLANT, BILATERAL Bilateral    COLONOSCOPY WITH PROPOFOL N/A 04/22/2013   Procedure: COLONOSCOPY WITH PROPOFOL;  Surgeon: [name], MD;  Location: [facility] ENDOSCOPY;  Service: Endoscopy;  Laterality: N/A;   DILATION AND CURETTAGE OF UTERUS  X 2   "when I was going thru menopause"   ESOPHAGOGASTRODUODENOSCOPY (EGD) WITH PROPOFOL N/A 04/22/2013   Procedure: ESOPHAGOGASTRODUODENOSCOPY (EGD) WITH PROPOFOL;  Surgeon: [name], MD;  Location: [facility] ENDOSCOPY;  Service: Endoscopy;  Laterality: N/A;   HERNIA REPAIR     INCISIONAL HERNIA REPAIR     INSERT / REPLACE / REMOVE PACEMAKER  06/2007   "took out the old; put in new"   INSERT / REPLACE / REMOVE PACEMAKER  05/10/2014   MDT PPM generator change by Dr [name]   JOINT REPLACEMENT     PARTIAL NEPHRECTOMY Left 05/1974   stone disease   PERMANENT PACEMAKER GENERATOR CHANGE N/A 05/10/2014   Procedure: PERMANENT PACEMAKER GENERATOR CHANGE;  Surgeon: Thompson Grayer, MD;  Location: [facility] CATH LAB;  Service: Cardiovascular;  Laterality: N/A;   TONSILLECTOMY AND ADENOIDECTOMY  1930's   TOTAL KNEE ARTHROPLASTY Right 2001    Allergies  Allergen Reactions   Combigan [Brimonidine Tartrate-Timolol] Other (See Comments)    Systemic malaise amigen eye drop   Tape Other (See Comments)    SKIN IS VERY FRAGILE!!   Penicillins Hives   Breo Ellipta [Fluticasone  Furoate-Vilanterol] Other (See Comments) and Hypertension    Increased blood pressure   Fluticasone    Other     SEASONAL ALLERGIES   Timolol    Vancomycin Other (See Comments)    Red man syndrome - Mild redness and discomfort. Able to complete initial dose.     Allergies as of 04/18/2022       Reactions   Combigan [brimonidine Tartrate-timolol] Other (See Comments)   Systemic malaise amigen eye drop   Tape Other (See Comments)   SKIN IS VERY FRAGILE!!   Penicillins Hives   Breo Ellipta [fluticasone Furoate-vilanterol] Other (See Comments), Hypertension   Increased blood pressure   Fluticasone    Other    SEASONAL ALLERGIES   Timolol    Vancomycin Other (See Comments)   Red man syndrome - Mild redness and discomfort. Able to complete initial dose.         Medication List        Accurate as of April 18, 2022 10:13 AM. If you have any questions, ask your nurse or doctor.          albuterol 108 (  90 Base) MCG/ACT inhaler Commonly known as: VENTOLIN HFA Inhale 2 puffs into the lungs every 6 (six) hours as needed for wheezing or shortness of breath.   Azelastine HCl 137 MCG/SPRAY Soln USE 1 TO 2 SPRAYS INTO BOTH NOSTRILS TWICE DAILY.   Calcium 600-10 MG-MCG Chew Chew 1 capsule by mouth daily.   diclofenac Sodium 1 % Gel Commonly known as: VOLTAREN Apply 4 g topically 4 (four) times daily.   dorzolamide 2 % ophthalmic solution Commonly known as: TRUSOPT Place 1 drop into both eyes 2 (two) times daily.   Dulera 200-5 MCG/ACT Aero Generic drug: mometasone-formoterol Inhale 2 puffs into the lungs 2 (two) times daily.   famotidine 40 MG tablet Commonly known as: Pepcid Take 1 tablet (40 mg total) by mouth at bedtime.   fluticasone 50 MCG/ACT nasal spray Commonly known as: FLONASE USE 2 SPRAYS EACH NOSTRIL ONCE A DAY AS NEEDED FOR ALLERGIES OR CONGESTION.   gabapentin 100 MG capsule Commonly known as: NEURONTIN Take 1 capsule (100 mg total) by mouth at  bedtime.   guaiFENesin 600 MG 12 hr tablet Commonly known as: MUCINEX Take 1 tablet (600 mg total) by mouth 2 (two) times daily.   latanoprost 0.005 % ophthalmic solution Commonly known as: XALATAN Place 1 drop into both eyes at bedtime.   levalbuterol 0.63 MG/3ML nebulizer solution Commonly known as: XOPENEX Take 0.63 mg by nebulization every 6 (six) hours as needed for wheezing or shortness of breath.   levothyroxine 75 MCG tablet Commonly known as: SYNTHROID Take 1 tablet (75 mcg total) by mouth daily.   loratadine 10 MG tablet Commonly known as: CLARITIN Take 1 tablet (10 mg total) by mouth daily.   losartan 50 MG tablet Commonly known as: COZAAR Take 1 tablet (50 mg total) by mouth daily.   Lutein 20 MG Caps Take 1 capsule (20 mg total) by mouth daily.   Magnesium 500 MG Tabs Take 750 mg by mouth See admin instructions. Take 750 mg by mouth every evening (in conjunction with 3 BioComplete capsules)   metoprolol tartrate 50 MG tablet Commonly known as: LOPRESSOR Take 1.5 tablets (75 mg total) by mouth in the morning.   montelukast 10 MG tablet Commonly known as: SINGULAIR Take 1 tablet (10 mg total) by mouth daily.   NON FORMULARY Take 3 capsules by mouth See admin instructions. Gundry MD Bio Complete 3 - Prebiotic, Probiotic, Postbiotic to Support Optimal Gut Health capsules- TAKE 3 CAPSULES BY MOUTH EVERY EVENING (in conjunction with 750 mg Magnesium)   potassium chloride 10 MEQ tablet Commonly known as: KLOR-CON Take 2 tablets (20 mEq total) by mouth daily.   predniSONE 10 MG tablet Commonly known as: DELTASONE Take 10 mg by mouth daily with breakfast. What changed: Another medication with the same name was removed. Continue taking this medication, and follow the directions you see here. Changed by: Royal Hawthorn, NP   PREDNISONE PO Take 30 mg by mouth daily. What changed: Another medication with the same name was removed. Continue taking this  medication, and follow the directions you see here. Changed by: Royal Hawthorn, NP   PREDNISONE PO Take 40 mg by mouth daily. What changed: Another medication with the same name was removed. Continue taking this medication, and follow the directions you see here. Changed by: Royal Hawthorn, NP   predniSONE 5 MG tablet Commonly known as: DELTASONE Take 5 mg by mouth daily with breakfast. What changed: Another medication with the same name was removed. Continue taking this  medication, and follow the directions you see here. Changed by: Royal Hawthorn, NP   PreserVision AREDS 2 Caps Take 1 capsule by mouth in the morning and at bedtime.   Rivaroxaban 15 MG Tabs tablet Commonly known as: Xarelto TAKE 1 TABLET BY MOUTH  DAILY WITH SUPPER   sennosides-docusate sodium 8.6-50 MG tablet Commonly known as: SENOKOT-S Take 2 tablets by mouth at bedtime.   torsemide 20 MG tablet Commonly known as: DEMADEX Take 1 tablet (20 mg total) by mouth daily as needed. 3 lb weight gain in 1 day or 5 lb in 1 week or excessive edema   torsemide 20 MG tablet Commonly known as: DEMADEX Take 1.5 tablets (30 mg total) by mouth daily.   TYLENOL 500 MG tablet Generic drug: acetaminophen Take 500 mg by mouth in the morning. What changed: Another medication with the same name was removed. Continue taking this medication, and follow the directions you see here. Changed by: Royal Hawthorn, NP   Vitamin D 125 MCG (5000 UT) Caps Take 1 capsule by mouth daily.        Review of Systems  Constitutional:  Negative for activity change, appetite change, chills, diaphoresis, fatigue, fever and unexpected weight change.  HENT:  Positive for congestion, hearing loss and postnasal drip. Negative for sinus pressure, sinus pain, sneezing, sore throat and trouble swallowing.   Respiratory:  Positive for cough, shortness of breath and wheezing.   Cardiovascular:  Negative for chest pain, palpitations and leg swelling.   Gastrointestinal:  Negative for abdominal distention, abdominal pain, constipation and diarrhea.  Genitourinary:  Negative for difficulty urinating and dysuria.  Musculoskeletal:  Negative for arthralgias, back pain, gait problem, joint swelling and myalgias.  Neurological:  Negative for dizziness, tremors, seizures, syncope, facial asymmetry, speech difficulty, weakness, light-headedness, numbness and headaches.  Psychiatric/Behavioral:  Negative for agitation, behavioral problems and confusion.     Immunization History  Administered Date(s) Administered   COVID-19, mRNA, vaccine(Comirnaty)12 years and older 01/28/2022   Influenza Split 01/07/2011   Influenza Whole 01/16/2009, 01/23/2010   Influenza, High Dose Seasonal PF 01/06/2013, 01/15/2019, 02/04/2020   Influenza,inj,Quad PF,6+ Mos 02/17/2015, 01/30/2018, 01/07/2022   Influenza,inj,quad, With Preservative 01/06/2017   Influenza-Unspecified 01/06/2014, 02/01/2016, 02/04/2020, 01/26/2021   Moderna Covid-19 Vaccine Bivalent Booster 35yr & up 08/09/2021   Moderna SARS-COV2 Booster Vaccination 08/18/2020   Moderna Sars-Covid-2 Vaccination 04/20/2019, 05/18/2019, 02/22/2020   Pneumococcal Conjugate-13 02/24/2019   Pneumococcal Polysaccharide-23 11/23/2013   Respiratory Syncytial Virus Vaccine,Recomb Aduvanted(Arexvy) 01/07/2022   Tdap 02/24/2019   Zoster Recombinat (Shingrix) 03/13/2017, 05/20/2017   Pertinent  Health Maintenance Due  Topic Date Due   INFLUENZA VACCINE  Completed   DEXA SCAN  Discontinued      04/09/2022   11:00 AM 04/10/2022    3:00 AM 04/10/2022    7:28 AM 04/10/2022    9:00 PM 04/11/2022   10:00 AM  Fall Risk  Patient Fall Risk Level Moderate fall risk Moderate fall risk Moderate fall risk High fall risk High fall risk   Functional Status Survey:    Vitals:   04/18/22 0945  BP: (!) 159/73  Pulse: 64  Resp: 16  Temp: (!) 97.2 F (36.2 C)  SpO2: 98%  Weight: 127 lb 12.8 oz (58 kg)  Height: '5\' 2"'$  (1.575  m)   Body mass index is 23.37 kg/m. Physical Exam Vitals and nursing note reviewed.  Constitutional:      General: She is not in acute distress.    Appearance: She is not diaphoretic.  HENT:     Head: Normocephalic and atraumatic.     Nose: Rhinorrhea present.     Mouth/Throat:     Mouth: Mucous membranes are moist.     Comments: White sputum to posterior pharynx Neck:     Vascular: No JVD.  Cardiovascular:     Rate and Rhythm: Normal rate and regular rhythm.     Heart sounds: No murmur heard. Pulmonary:     Effort: Pulmonary effort is normal. No respiratory distress.     Breath sounds: Wheezing and rhonchi present.  Skin:    General: Skin is warm and dry.  Neurological:     Mental Status: She is alert and oriented to person, place, and time.     Labs reviewed: Recent Labs    06/28/21 0000 11/15/21 1531 03/21/22 0000 04/08/22 1045 04/09/22 0501  NA 145  145   < > 141 139 136  K 4.0  4.0   < > 3.8 3.0* 3.6  CL 101  101   < > 102 101 101  CO2 30*  30*   < > 30* 26 24  GLUCOSE  --   --   --  142* 196*  BUN 25*  25*   < > 38* 39* 39*  CREATININE 1.0  1.0   < > 1.4* 1.43* 1.39*  CALCIUM 10.1  10.1   < > 9.6 8.9 8.8*  MG 2.6  2.6  --   --   --   --    < > = values in this interval not displayed.   Recent Labs    11/15/21 1531 03/21/22 0000 04/08/22 1045  AST 35 34 22  ALT 41* 37* 15  ALKPHOS 60 53 42  BILITOT  --   --  1.4*  PROT  --   --  6.2*  ALBUMIN 4.4 4.1 4.0   Recent Labs    11/15/21 1531 04/08/22 1045 04/09/22 0501  WBC 6.9 12.8* 9.8  NEUTROABS  --  8.7*  --   HGB 13.1 12.0 11.5*  HCT 39 38.4 36.7  MCV  --  100.0 100.3*  PLT 249 223 217   Lab Results  Component Value Date   TSH 2.57 11/15/2021   Lab Results  Component Value Date   HGBA1C 5.7 (H) 06/25/2017   Lab Results  Component Value Date   CHOL 138 11/04/2017   HDL 70 11/04/2017   LDLCALC 58 11/04/2017   TRIG 53 11/04/2017   CHOLHDL 3.1 06/25/2017    Significant  Diagnostic Results in last 30 days:  DG Chest Port 1 View  Result Date: 04/08/2022 CLINICAL DATA:  Cough, wheezing EXAM: PORTABLE CHEST 1 VIEW COMPARISON:  11/28/2021 FINDINGS: Cardiomegaly. Left chest multi lead pacer. Probable retrocardiac atelectasis. The visualized skeletal structures are unremarkable. IMPRESSION: Cardiomegaly with probable retrocardiac atelectasis. Electronically Signed   By: Delanna Ahmadi M.D.   On: 04/08/2022 11:25    Assessment/Plan  1. COPD with acute exacerbation ([place]) Continues with wheezing, cough sputum production  Lung sounds with rhonchi and wheezing Continue prednisone on taper currently transitioning to 30 mg Scheduled xopenex q 4  Add doxycycline 100 mg bid x 7 days, which seems to have helped her in the past Continue pulmonary toilet Keep HOB 45 degrees Monitor for increased sob and decreased 02 sats.

## 2022-04-18 NOTE — Addendum Note (Signed)
Addended by: Barnie Mort on: 04/18/2022 06:15 PM   Modules accepted: Orders

## 2022-04-19 DIAGNOSIS — J418 Mixed simple and mucopurulent chronic bronchitis: Secondary | ICD-10-CM | POA: Diagnosis not present

## 2022-04-19 DIAGNOSIS — J410 Simple chronic bronchitis: Secondary | ICD-10-CM | POA: Diagnosis not present

## 2022-04-19 DIAGNOSIS — R4189 Other symptoms and signs involving cognitive functions and awareness: Secondary | ICD-10-CM | POA: Diagnosis not present

## 2022-04-19 DIAGNOSIS — R0602 Shortness of breath: Secondary | ICD-10-CM | POA: Diagnosis not present

## 2022-04-19 DIAGNOSIS — M6389 Disorders of muscle in diseases classified elsewhere, multiple sites: Secondary | ICD-10-CM | POA: Diagnosis not present

## 2022-04-19 DIAGNOSIS — J17 Pneumonia in diseases classified elsewhere: Secondary | ICD-10-CM | POA: Diagnosis not present

## 2022-04-22 DIAGNOSIS — J17 Pneumonia in diseases classified elsewhere: Secondary | ICD-10-CM | POA: Diagnosis not present

## 2022-04-22 DIAGNOSIS — R0602 Shortness of breath: Secondary | ICD-10-CM | POA: Diagnosis not present

## 2022-04-22 DIAGNOSIS — R4189 Other symptoms and signs involving cognitive functions and awareness: Secondary | ICD-10-CM | POA: Diagnosis not present

## 2022-04-22 DIAGNOSIS — J410 Simple chronic bronchitis: Secondary | ICD-10-CM | POA: Diagnosis not present

## 2022-04-22 DIAGNOSIS — M6389 Disorders of muscle in diseases classified elsewhere, multiple sites: Secondary | ICD-10-CM | POA: Diagnosis not present

## 2022-04-22 DIAGNOSIS — J418 Mixed simple and mucopurulent chronic bronchitis: Secondary | ICD-10-CM | POA: Diagnosis not present

## 2022-04-23 ENCOUNTER — Non-Acute Institutional Stay (SKILLED_NURSING_FACILITY): Payer: Medicare Other | Admitting: Orthopedic Surgery

## 2022-04-23 ENCOUNTER — Encounter: Payer: Self-pay | Admitting: Orthopedic Surgery

## 2022-04-23 DIAGNOSIS — E039 Hypothyroidism, unspecified: Secondary | ICD-10-CM

## 2022-04-23 DIAGNOSIS — I4821 Permanent atrial fibrillation: Secondary | ICD-10-CM | POA: Diagnosis not present

## 2022-04-23 DIAGNOSIS — J17 Pneumonia in diseases classified elsewhere: Secondary | ICD-10-CM | POA: Diagnosis not present

## 2022-04-23 DIAGNOSIS — H353130 Nonexudative age-related macular degeneration, bilateral, stage unspecified: Secondary | ICD-10-CM | POA: Diagnosis not present

## 2022-04-23 DIAGNOSIS — K219 Gastro-esophageal reflux disease without esophagitis: Secondary | ICD-10-CM

## 2022-04-23 DIAGNOSIS — I495 Sick sinus syndrome: Secondary | ICD-10-CM | POA: Diagnosis not present

## 2022-04-23 DIAGNOSIS — J441 Chronic obstructive pulmonary disease with (acute) exacerbation: Secondary | ICD-10-CM | POA: Diagnosis not present

## 2022-04-23 DIAGNOSIS — M6389 Disorders of muscle in diseases classified elsewhere, multiple sites: Secondary | ICD-10-CM | POA: Diagnosis not present

## 2022-04-23 DIAGNOSIS — J410 Simple chronic bronchitis: Secondary | ICD-10-CM | POA: Diagnosis not present

## 2022-04-23 DIAGNOSIS — H903 Sensorineural hearing loss, bilateral: Secondary | ICD-10-CM

## 2022-04-23 DIAGNOSIS — N1831 Chronic kidney disease, stage 3a: Secondary | ICD-10-CM | POA: Diagnosis not present

## 2022-04-23 DIAGNOSIS — I10 Essential (primary) hypertension: Secondary | ICD-10-CM | POA: Diagnosis not present

## 2022-04-23 DIAGNOSIS — R0602 Shortness of breath: Secondary | ICD-10-CM | POA: Diagnosis not present

## 2022-04-23 DIAGNOSIS — J418 Mixed simple and mucopurulent chronic bronchitis: Secondary | ICD-10-CM | POA: Diagnosis not present

## 2022-04-23 DIAGNOSIS — J189 Pneumonia, unspecified organism: Secondary | ICD-10-CM | POA: Diagnosis not present

## 2022-04-23 DIAGNOSIS — R4189 Other symptoms and signs involving cognitive functions and awareness: Secondary | ICD-10-CM | POA: Diagnosis not present

## 2022-04-23 DIAGNOSIS — I5032 Chronic diastolic (congestive) heart failure: Secondary | ICD-10-CM

## 2022-04-23 NOTE — Progress Notes (Signed)
Location:  [facility] Room Number: 149A Place of Service:  SNF [phone])  Provider: Windell Moulding, NP   PCP: [name], MD Patient Care Team: [name], MD as PCP - General (Internal Medicine) Jerline Pain, MD as PCP - Cardiology (Cardiology) Thompson Grayer, MD as Consulting Physician (Cardiology) Deneise Lever, MD as Consulting Physician (Pulmonary Disease) Rutherford Guys, MD as Consulting Physician (Ophthalmology) [name], MD as Consulting Physician (Gastroenterology) Gaynelle Arabian, MD as Consulting Physician (Orthopedic Surgery)  Extended Emergency Contact Information Primary Emergency Contact: [name] of [place] Phone: [phone] Mobile Phone: [phone] Relation: Daughter Secondary Emergency Contact: [name] of [place] Phone: [phone] Mobile Phone: [phone] Relation: Son  Code Status: DNR Goals of care:  Advanced Directive information    04/23/2022   11:45 AM  Advanced Directives  Does Patient Have a Medical Advance Directive? Yes  Type of Paramedic of [place];Living will;Out of facility DNR (pink MOST or yellow form)  Does patient want to make changes to medical advance directive? No - Patient declined  Copy of [place] in Chart? Yes - validated most recent copy scanned in chart (See row information)  Pre-existing out of facility DNR order (yellow form or pink MOST form) Pink MOST form placed in chart (order not valid for inpatient use);Yellow form placed in chart (order not valid for inpatient use)     Allergies  Allergen Reactions   Combigan [Brimonidine Tartrate-Timolol] Other (See Comments)    Systemic malaise amigen eye drop   Tape Other (See Comments)    SKIN IS VERY FRAGILE!!   Penicillins Hives   Breo Ellipta [Fluticasone Furoate-Vilanterol] Other (See Comments) and Hypertension    Increased  blood pressure   Fluticasone    Other     SEASONAL ALLERGIES   Timolol    Vancomycin Other (See Comments)    Red man syndrome - Mild redness and discomfort. Able to complete initial dose.     Chief Complaint  Patient presents with   Medical Management of Chronic Issues    Discharge with no refill medications.    HPI:  89 y.o. female seen today for discharge evaluation.   She currently resides on the skilled nursing unit at [facility] PMH: HTN, afib, s/p pacemaker d/t SSS, chronic diastolic CHF, COPD with chronic bronchitis, OA, CKD,hypothyroidism, blindness, glaucoma, macular degeneration, HOH and constipation.   Hospitalized 01/01- 01/04 due to increased coughing and wheezing. CXR noted cardiomegaly and retrocardiac atelectasis. Diagnosed with COPD exacerbation and CAP. She was given pulmonary toilet, antibiotics, bronchodilator and steroids. She was discharged to rehab for additional nursing care and PT/OT. 01/08 prednisone taper was extended due to lingering cough. 01/11 doxycycline x 7 days was started. She remained off oxygen.   Today, she plans to discharge back to apartment with caregiver. No recent falls. Ambulates with walker. Reports mild sob with exertion. O2 sats > 95% on room air. She continues to use flutter valve. She has a productive cough with yellow sputum in the morning. Appetite fair. Afebrile, vitals stable. Scheduled to f/u with PCP in 2 weeks.     Past Medical History:  Diagnosis Date   Anemia    Aortic stenosis    a. Echo 09/06/12 EF 55-60%, no WMA, G2DD, Ao valve sclerosis w/ mod stenosis, LA mildly dilated, PA pressure 24mHg   Asthmatic bronchitis    Complete heart block (HCC)    s/p permanent  pacemaker 06/27/1999 (Battery change 06/2007 and 2016).  s/p AV nodal ablation by Dr [name] 2016.   COPD (chronic obstructive pulmonary disease) (HCC)    Dr. [name]   DDD (degenerative disc disease)    Diastolic dysfunction    a. Echo 09/06/12 EF 55-60%, no WMA, G2DD,  Ao valve sclerosis w/ mod stenosis, LA mildly dilated, PA pressure 18mHg   Diverticulosis    DVT (deep vein thrombosis) in pregnancy 1954   a. LLE   Hypertension    Hypothyroidism    on medication   Macular degeneration    Dr. KEliezer Bottom  Osteoarthrosis, unspecified whether generalized or localized, other specified sites    PAF (paroxysmal atrial fibrillation) ([place]    Stopped flecainide, on amiodarone but still has bouts of A FIB (mostly in mornings).    Pure hypercholesterolemia    Staghorn calculus    Left    Past Surgical History:  Procedure Laterality Date   APPENDECTOMY  ~ 1941   AV NODE ABLATION  05/10/2014   AV NODE ABLATION N/A 05/10/2014   Procedure: AV NODE ABLATION;  Surgeon: JThompson Grayer MD;  [facility];  Service: Cardiovascular;  Laterality: N/A;   BUNIONECTOMY WITH HAMMERTOE RECONSTRUCTION Bilateral ~ [place] 06/27/99   Medtronic PM implanted by Dr ELeonia Reeves  CARDIOVERSION N/A 12/02/2013   Procedure: CARDIOVERSION;  Surgeon: PFay Records MD;  [facility]  Service: Cardiovascular;  Laterality: N/A;   CATARACT EXTRACTION W/ INTRAOCULAR LENS  IMPLANT, BILATERAL Bilateral    COLONOSCOPY WITH PROPOFOL N/A 04/22/2013   Procedure: COLONOSCOPY WITH PROPOFOL;  Surgeon: MGarlan Fair MD;  Location: [facility];  Service: Endoscopy;  Laterality: N/A;   DILATION AND CURETTAGE OF UTERUS  X 2   "when I was going thru menopause"   ESOPHAGOGASTRODUODENOSCOPY (EGD) WITH PROPOFOL N/A 04/22/2013   Procedure: ESOPHAGOGASTRODUODENOSCOPY (EGD) WITH PROPOFOL;  Surgeon: MGarlan Fair MD;  Location: [facility];  Service: Endoscopy;  Laterality: N/A;   HERNIA REPAIR     INCISIONAL HERNIA REPAIR     INSERT / REPLACE / REMOVE PACEMAKER  06/2007   "took out the old; put in new"   INSERT / REPLACE / REMOVE PACEMAKER  05/10/2014   MDT PPM generator change by Dr ARayann Heman  JOINT REPLACEMENT     PARTIAL NEPHRECTOMY Left 05/1974   stone disease    PERMANENT PACEMAKER GENERATOR CHANGE N/A 05/10/2014   Procedure: PERMANENT PACEMAKER GENERATOR CHANGE;  Surgeon: JThompson Grayer MD;  [facility];  Service: Cardiovascular;  Laterality: N/A;   TONSILLECTOMY AND ADENOIDECTOMY  1930's   TOTAL KNEE ARTHROPLASTY Right 2001      reports that she has never smoked. She has never used smokeless tobacco. She reports current alcohol use of about 7.0 standard drinks of alcohol per week. She reports that she does not use drugs. Social History   Socioeconomic History   Marital status: Widowed    Spouse name: Not on file   Number of children: 2   Years of education: Not on file   Highest education level: Not on file  Occupational History   Occupation: RETIRED    Employer: RETIRED    Comment: Family [employer] business  Tobacco Use   Smoking status: Never   Smokeless tobacco: Never  Vaping Use   Vaping Use: Never used  Substance and Sexual Activity   Alcohol use: Yes    Alcohol/week: 7.0 standard drinks of alcohol    Types:  7 Glasses of wine per week    Comment: occasionally   Drug use: No   Sexual activity: Not Currently    Partners: Male  Other Topics Concern   Not on file  Social History Narrative   Diet? Low salt, low fat      Do you drink/eat things with caffeine? yes      Marital status?                 married                   What year were you married? 1949      Do you live in a house, apartment, assisted living, condo, trailer, etc.? apartment      Is it one or more stories? 3 stories      How many persons live in your home? Just me      Do you have any pets in your home? (please list) no      Current or past profession: accounting      Do you exercise?          yes                            Type & how often? Walk, class, prescribed daily      Do you have a living will? yes      Do you have a DNR form?     yes                             If not, do you want to discuss one?      Do you have signed POA/HPOA for  forms? no         Social Determinants of Health   Financial Resource Strain: Low Risk  (02/16/2021)   Overall Financial Resource Strain (CARDIA)    Difficulty of Paying Living Expenses: Not hard at all  Food Insecurity: No Food Insecurity (04/08/2022)   Hunger Vital Sign    Worried About Running Out of Food in the Last Year: Never true    Ran Out of Food in the Last Year: Never true  Transportation Needs: No Transportation Needs (04/08/2022)   PRAPARE - Hydrologist (Medical): No    Lack of Transportation (Non-Medical): No  Physical Activity: Sufficiently Active (02/16/2021)   Exercise Vital Sign    Days of Exercise per Week: 7 days    Minutes of Exercise per Session: 30 min  Stress: No Stress Concern Present (02/16/2021)   [place]    Feeling of Stress : Not at all  Social Connections: Moderately Isolated (02/16/2021)   Social Connection and Isolation Panel [NHANES]    Frequency of Communication with Friends and Family: More than three times a week    Frequency of Social Gatherings with Friends and Family: More than three times a week    Attends Religious Services: Never    Marine scientist or Organizations: Yes    Attends Music therapist: More than 4 times per year    Marital Status: Widowed  Intimate Partner Violence: Not At Risk (04/08/2022)   Humiliation, Afraid, Rape, and Kick questionnaire    Fear of Current or Ex-Partner: No    Emotionally Abused: No    Physically Abused: No  Sexually Abused: No   Functional Status Survey:    Allergies  Allergen Reactions   Combigan [Brimonidine Tartrate-Timolol] Other (See Comments)    Systemic malaise amigen eye drop   Tape Other (See Comments)    SKIN IS VERY FRAGILE!!   Penicillins Hives   Breo Ellipta [Fluticasone Furoate-Vilanterol] Other (See Comments) and Hypertension    Increased blood pressure    Fluticasone    Other     SEASONAL ALLERGIES   Timolol    Vancomycin Other (See Comments)    Red man syndrome - Mild redness and discomfort. Able to complete initial dose.     Pertinent  Health Maintenance Due  Topic Date Due   INFLUENZA VACCINE  Completed   DEXA SCAN  Discontinued    Medications: Outpatient Encounter Medications as of 04/23/2022  Medication Sig   albuterol (VENTOLIN HFA) 108 (90 Base) MCG/ACT inhaler Inhale 2 puffs into the lungs every 6 (six) hours as needed for wheezing or shortness of breath.   Azelastine HCl 137 MCG/SPRAY SOLN USE 1 TO 2 SPRAYS INTO BOTH NOSTRILS TWICE DAILY.   Calcium 600-10 MG-MCG CHEW Chew 1 capsule by mouth daily.   Cholecalciferol (VITAMIN D) 125 MCG (5000 UT) CAPS Take 1 capsule by mouth daily.   diclofenac Sodium (VOLTAREN) 1 % GEL Apply 4 g topically 4 (four) times daily.   dorzolamide (TRUSOPT) 2 % ophthalmic solution Place 1 drop into both eyes 2 (two) times daily.   doxycycline (VIBRA-TABS) 100 MG tablet Take 1 tablet (100 mg total) by mouth 2 (two) times daily for 7 days.   DULERA 200-5 MCG/ACT AERO Inhale 2 puffs into the lungs 2 (two) times daily.   famotidine (PEPCID) 40 MG tablet Take 1 tablet (40 mg total) by mouth at bedtime.   fluticasone (FLONASE) 50 MCG/ACT nasal spray USE 2 SPRAYS EACH NOSTRIL ONCE A DAY AS NEEDED FOR ALLERGIES OR CONGESTION.   gabapentin (NEURONTIN) 100 MG capsule Take 1 capsule (100 mg total) by mouth at bedtime.   guaiFENesin (MUCINEX) 600 MG 12 hr tablet Take 1 tablet (600 mg total) by mouth 2 (two) times daily.   latanoprost (XALATAN) 0.005 % ophthalmic solution Place 1 drop into both eyes at bedtime.   levalbuterol (XOPENEX) 0.63 MG/3ML nebulizer solution Take 0.63 mg by nebulization every 6 (six) hours as needed for wheezing or shortness of breath.   levalbuterol (XOPENEX) 0.63 MG/3ML nebulizer solution Take 3 mLs (0.63 mg total) by nebulization every 4 (four) hours.   levothyroxine (SYNTHROID) 75 MCG  tablet Take 1 tablet (75 mcg total) by mouth daily.   loratadine (CLARITIN) 10 MG tablet Take 1 tablet (10 mg total) by mouth daily.   losartan (COZAAR) 50 MG tablet Take 1 tablet (50 mg total) by mouth daily.   Lutein 20 MG CAPS Take 1 capsule (20 mg total) by mouth daily.   Magnesium 500 MG TABS Take 750 mg by mouth See admin instructions. Take 750 mg by mouth every evening (in conjunction with 3 BioComplete capsules)   metoprolol tartrate (LOPRESSOR) 50 MG tablet Take 1.5 tablets (75 mg total) by mouth in the morning.   montelukast (SINGULAIR) 10 MG tablet Take 1 tablet (10 mg total) by mouth daily.   Multiple Vitamins-Minerals (PRESERVISION AREDS 2) CAPS Take 1 capsule by mouth in the morning and at bedtime.   NON FORMULARY Take 3 capsules by mouth See admin instructions. Gundry MD Bio Complete 3 - Prebiotic, Probiotic, Postbiotic to Support Optimal Gut Health capsules- TAKE 3  CAPSULES BY MOUTH EVERY EVENING (in conjunction with 750 mg Magnesium)   potassium chloride (KLOR-CON) 10 MEQ tablet Take 2 tablets (20 mEq total) by mouth daily.   predniSONE (DELTASONE) 10 MG tablet Take 10 mg by mouth daily with breakfast. Taper 40 mg daily for 4 days, 30 mg daily for 3 days, 20 mg daily for 2 days then 10 mg daily for 1 day then back to 5 mg   [START ON 04/28/2022] predniSONE (DELTASONE) 5 MG tablet Take 5 mg by mouth daily with breakfast.   Rivaroxaban (XARELTO) 15 MG TABS tablet TAKE 1 TABLET BY MOUTH  DAILY WITH SUPPER   sennosides-docusate sodium (SENOKOT-S) 8.6-50 MG tablet Take 2 tablets by mouth at bedtime.   torsemide (DEMADEX) 20 MG tablet Take 1 tablet (20 mg total) by mouth daily as needed. 3 lb weight gain in 1 day or 5 lb in 1 week or excessive edema   torsemide (DEMADEX) 20 MG tablet Take 1.5 tablets (30 mg total) by mouth daily.   TYLENOL 500 MG tablet Take 500 mg by mouth in the morning.   No facility-administered encounter medications on file as of 04/23/2022.    Review of Systems   Constitutional:  Negative for activity change, appetite change, fatigue and fever.  HENT:  Negative for congestion, sore throat and trouble swallowing.   Eyes:  Negative for visual disturbance.  Respiratory:  Positive for cough, shortness of breath and wheezing.   Cardiovascular:  Negative for chest pain and leg swelling.  Gastrointestinal:  Positive for constipation. Negative for abdominal distention, abdominal pain, diarrhea, nausea and vomiting.  Genitourinary:  Negative for dysuria and frequency.  Musculoskeletal:  Positive for gait problem.  Skin:  Negative for wound.  Neurological:  Positive for weakness. Negative for dizziness and headaches.  Psychiatric/Behavioral:  Negative for confusion, dysphoric mood and sleep disturbance. The patient is not nervous/anxious.     Vitals:   04/23/22 1138  BP: 125/84  Pulse: 67  Resp: 18  Temp: (!) 97.4 F (36.3 C)  SpO2: 95%  Weight: 129 lb (58.5 kg)  Height: '5\' 2"'$  (1.575 m)   Body mass index is 23.59 kg/m. Physical Exam Vitals reviewed.  Constitutional:      General: She is not in acute distress. HENT:     Head: Normocephalic.     Right Ear: There is no impacted cerumen.     Left Ear: There is no impacted cerumen.     Nose: Nose normal.     Mouth/Throat:     Mouth: Mucous membranes are moist.  Eyes:     General:        Right eye: No discharge.        Left eye: No discharge.  Cardiovascular:     Rate and Rhythm: Normal rate. Rhythm irregular.     Pulses: Normal pulses.     Heart sounds: Normal heart sounds.  Pulmonary:     Effort: Pulmonary effort is normal. No respiratory distress.     Breath sounds: Examination of the right-upper field reveals wheezing and rhonchi. Examination of the left-upper field reveals wheezing and rhonchi. Wheezing and rhonchi present. No rales.     Comments: expiratory Abdominal:     General: Bowel sounds are normal. There is no distension.     Palpations: Abdomen is soft.     Tenderness:  There is no abdominal tenderness.  Musculoskeletal:     Cervical back: Neck supple.     Right lower leg: No edema.  Left lower leg: No edema.  Skin:    General: Skin is warm and dry.     Capillary Refill: Capillary refill takes less than 2 seconds.  Neurological:     General: No focal deficit present.     Mental Status: She is alert and oriented to person, place, and time.     Motor: Weakness present.     Gait: Gait abnormal.     Comments: Walker  Psychiatric:        Mood and Affect: Mood normal.        Behavior: Behavior normal.     Labs reviewed: Basic Metabolic Panel: Recent Labs    06/28/21 0000 11/15/21 1531 04/08/22 1045 04/09/22 0501 04/15/22 0000  NA 145  145   < > 139 136 143  K 4.0  4.0   < > 3.0* 3.6 3.6  CL 101  101   < > 101 101 101  CO2 30*  30*   < > 26 24 32*  GLUCOSE  --   --  142* 196*  --   BUN 25*  25*   < > 39* 39* 29*  CREATININE 1.0  1.0   < > 1.43* 1.39* 1.2*  CALCIUM 10.1  10.1   < > 8.9 8.8* 9.5  MG 2.6  2.6  --   --   --   --    < > = values in this interval not displayed.   Liver Function Tests: Recent Labs    11/15/21 1531 03/21/22 0000 04/08/22 1045  AST 35 34  34 22  ALT 41* 37*  37* 15  ALKPHOS 60 53  53 42  BILITOT  --   --  1.4*  PROT  --   --  6.2*  ALBUMIN 4.4 4.1  4.1 4.0   No results for input(s): "LIPASE", "AMYLASE" in the last 8760 hours. No results for input(s): "AMMONIA" in the last 8760 hours. CBC: Recent Labs    04/08/22 1045 04/09/22 0501 04/15/22 0000  WBC 12.8* 9.8 10.8  NEUTROABS 8.7*  --  8.00  HGB 12.0 11.5* 12.0  HCT 38.4 36.7 37  MCV 100.0 100.3*  --   PLT 223 217 330   Cardiac Enzymes: No results for input(s): "CKTOTAL", "CKMB", "CKMBINDEX", "TROPONINI" in the last 8760 hours. BNP: Invalid input(s): "POCBNP" CBG: No results for input(s): "GLUCAP" in the last 8760 hours.  Procedures and Imaging Studies During Stay: [facility] Chest Port 1 View  Result Date: 04/08/2022 CLINICAL DATA:   Cough, wheezing EXAM: PORTABLE CHEST 1 VIEW COMPARISON:  11/28/2021 FINDINGS: Cardiomegaly. Left chest multi lead pacer. Probable retrocardiac atelectasis. The visualized skeletal structures are unremarkable. IMPRESSION: Cardiomegaly with probable retrocardiac atelectasis. Electronically Signed   By: Delanna Ahmadi M.D.   On: 04/08/2022 11:25    Assessment/Plan:   1. COPD with acute exacerbation ([place]) - hospitalized 01/01- 01/04> given pulmonary toilet, antibiotics, bronchodilators and steroids - 01/08 prednisone taper extended due to cough - 01/11 doxycycline x 7 days started - some expiratory wheezing and rhonchi on exam - change duonebs to TID> [place] nurse notified - remains on room air  2. Community acquired pneumonia of left lung, unspecified part of lung - see above  3. Sick sinus syndrome ([place]) - s/p pacemaker 2001  4. Permanent atrial fibrillation (HCC) - HR < 100 with lopressor - cont xarelto for clot prevention  5. Chronic diastolic congestive heart failure ([place]) - 01/05 LVEF 65-70% - no weight fluctuations - cont torsemide  6. Essential hypertension - controlled  - cont losartan  7. Gastroesophageal reflux disease, unspecified whether esophagitis present - hgb stable - cont famotidine  8. Acquired hypothyroidism - TSH stable - cont levothyroxine  9. Stage 3a chronic kidney disease (HCC) - avoid nephrotoxic drugs like NSAIDS and dose adjust medications to be renally excreted - encourage hydration with water   10. Bilateral nonexudative age-related macular degeneration, unspecified stage - cont caregiver assistance  11. Sensorineural hearing loss (SNHL) of both ears   Patient is being discharged with the following home health services: caregiver  Patient is being discharged with the following durable medical equipment: none  Patient has been advised to f/u with their PCP in 1-2 weeks to for a transitions of care visit.  Social services at their facility was  responsible for arranging this appointment.  Pt was provided with adequate prescriptions of noncontrolled medications to reach the scheduled appointment .  For controlled substances, a limited supply was provided as appropriate for the individual patient.  If the pt normally receives these medications from a pain clinic or has a contract with another physician, these medications should be received from that clinic or physician only).    Future labs/tests needed: none

## 2022-04-23 NOTE — Progress Notes (Signed)
abstraction

## 2022-04-24 DIAGNOSIS — R4189 Other symptoms and signs involving cognitive functions and awareness: Secondary | ICD-10-CM | POA: Diagnosis not present

## 2022-04-24 DIAGNOSIS — M6389 Disorders of muscle in diseases classified elsewhere, multiple sites: Secondary | ICD-10-CM | POA: Diagnosis not present

## 2022-04-24 DIAGNOSIS — J17 Pneumonia in diseases classified elsewhere: Secondary | ICD-10-CM | POA: Diagnosis not present

## 2022-04-24 DIAGNOSIS — J410 Simple chronic bronchitis: Secondary | ICD-10-CM | POA: Diagnosis not present

## 2022-04-24 DIAGNOSIS — J418 Mixed simple and mucopurulent chronic bronchitis: Secondary | ICD-10-CM | POA: Diagnosis not present

## 2022-04-24 DIAGNOSIS — R0602 Shortness of breath: Secondary | ICD-10-CM | POA: Diagnosis not present

## 2022-04-24 MED ORDER — LEVALBUTEROL HCL 0.63 MG/3ML IN NEBU: 0.6300 mg | INHALATION_SOLUTION | Freq: Three times a day (TID) | RESPIRATORY_TRACT | 12 refills | Status: DC

## 2022-04-26 DIAGNOSIS — M1712 Unilateral primary osteoarthritis, left knee: Secondary | ICD-10-CM | POA: Diagnosis not present

## 2022-04-26 DIAGNOSIS — M25562 Pain in left knee: Secondary | ICD-10-CM | POA: Diagnosis not present

## 2022-04-28 ENCOUNTER — Telehealth: Payer: Self-pay | Admitting: Orthopedic Surgery

## 2022-04-28 NOTE — Telephone Encounter (Signed)
[facility] nurse reports patient left knee pain improved. She received cortisone injection to left knee a few days ago. She is ambulating well with walker. Finished antibiotic for pneumonia. Breathing unlabored, Sats > 93% on room air. Patient requesting to go back to [place] with caregiver. Orders to discharge back to [place] given.

## 2022-04-30 DIAGNOSIS — R0602 Shortness of breath: Secondary | ICD-10-CM | POA: Diagnosis not present

## 2022-04-30 DIAGNOSIS — J410 Simple chronic bronchitis: Secondary | ICD-10-CM | POA: Diagnosis not present

## 2022-04-30 DIAGNOSIS — R4189 Other symptoms and signs involving cognitive functions and awareness: Secondary | ICD-10-CM | POA: Diagnosis not present

## 2022-04-30 DIAGNOSIS — J418 Mixed simple and mucopurulent chronic bronchitis: Secondary | ICD-10-CM | POA: Diagnosis not present

## 2022-04-30 DIAGNOSIS — M6389 Disorders of muscle in diseases classified elsewhere, multiple sites: Secondary | ICD-10-CM | POA: Diagnosis not present

## 2022-04-30 DIAGNOSIS — J17 Pneumonia in diseases classified elsewhere: Secondary | ICD-10-CM | POA: Diagnosis not present

## 2022-05-06 DIAGNOSIS — H401133 Primary open-angle glaucoma, bilateral, severe stage: Secondary | ICD-10-CM | POA: Diagnosis not present

## 2022-05-18 NOTE — Progress Notes (Unsigned)
Location:  [facility]  POS: Clinic  Provider: Royal Hawthorn, ANP  Code Status: DNR Goals of Care:     05/20/2022    1:31 PM  Advanced Directives  Does Patient Have a Medical Advance Directive? Yes  Type of Paramedic of [place];Out of facility DNR (pink MOST or yellow form)  Does patient want to make changes to medical advance directive? No - Patient declined  Copy of [place] in Chart? Yes - validated most recent copy scanned in chart (See row information)     Chief Complaint  Patient presents with   Medical Management of Chronic Issues    Medical Management of Chronic Issues. 3 Month follow up. Complains of cough and congestion.     HPI: Patient is a 89 y.o. female seen today for medical management of chronic diseases.    PMH significant for pacemaker due to SSS, afib, HTN, chronic diastolic CHF, COPD, chronic bronchitis, GERD, OA, CKD, HOH, glaucoma, macular degeneration, blindness, constipation.  Hospitalization 04/08/22-04/11/22 with coughing and wheezing. CXR 04/08/22 showed cardiomegaly with probable retrocardiac atelectasis. She was admitted for chronic bronchitis exacerbation. She was treated with increased steroid dosing, zithromax, inhaled steroids, bronchodilators, and pulmonary toilet. Did have some hypokalemia which was repleted. Mild leukocytosis which resolved. Came to rehab after this and needed therapy and eventually extended course of higher dose pred nisone (baseline 5) and took a round of doxycycline then improved and discharged. Did have some severe left knee pain and saw ortho on 04/26/22 for injection. Has an apt for f/u in one week. Uses tylenol and voltaren gel. Takes 650 mg bid   Up date:  Reports she is coughing more through the night and feels congested.  Mucus color has been clear, more volume than usual.  Takes mucinex daily  Took rescue inhaler twice, not on nebulizer stopped at the end of January  Sleeps  with wedge under bed Not short of breath at rest or when walking unless a long distance.  Rapid covid test positive.    Past Medical History:  Diagnosis Date   Anemia    Aortic stenosis    a. Echo 09/06/12 EF 55-60%, no WMA, G2DD, Ao valve sclerosis w/ mod stenosis, LA mildly dilated, PA pressure 41mHg   Asthmatic bronchitis    Complete heart block (HCC)    s/p permanent pacemaker 06/27/1999 (Battery change 06/2007 and 2016).  s/p AV nodal ablation by Dr ARayann Heman2016.   COPD (chronic obstructive pulmonary disease) (HCC)    Dr. YAnnamaria Boots  DDD (degenerative disc disease)    Diastolic dysfunction    a. Echo 09/06/12 EF 55-60%, no WMA, G2DD, Ao valve sclerosis w/ mod stenosis, LA mildly dilated, PA pressure 421mg   Diverticulosis    DVT (deep vein thrombosis) in pregnancy 1954   a. LLE   Hypertension    Hypothyroidism    on medication   Macular degeneration    Dr. KuEliezer Bottom Osteoarthrosis, unspecified whether generalized or localized, other specified sites    PAF (paroxysmal atrial fibrillation) ([facility]   Stopped flecainide, on amiodarone but still has bouts of A FIB (mostly in mornings).    Pure hypercholesterolemia    Staghorn calculus    Left    Past Surgical History:  Procedure Laterality Date   APPENDECTOMY  ~ [place]2/05/2014   AV NODE ABLATION N/A 05/10/2014   Procedure: AV NODE ABLATION;  Surgeon: JaThompson GrayerMD;  Location:  [facility] CATH LAB;  Service: Cardiovascular;  Laterality: N/A;   BUNIONECTOMY WITH HAMMERTOE RECONSTRUCTION Bilateral ~ [place]  06/27/99   Medtronic PM implanted by Dr [name]   CARDIOVERSION N/A 12/02/2013   Procedure: CARDIOVERSION;  Surgeon: [name], MD;  Location: [facility];  Service: Cardiovascular;  Laterality: N/A;   CATARACT EXTRACTION W/ INTRAOCULAR LENS  IMPLANT, BILATERAL Bilateral    COLONOSCOPY WITH PROPOFOL N/A 04/22/2013   Procedure: COLONOSCOPY WITH PROPOFOL;  Surgeon: [name], MD;   Location: [facility] ENDOSCOPY;  Service: Endoscopy;  Laterality: N/A;   DILATION AND CURETTAGE OF UTERUS  X 2   "when I was going thru menopause"   ESOPHAGOGASTRODUODENOSCOPY (EGD) WITH PROPOFOL N/A 04/22/2013   Procedure: ESOPHAGOGASTRODUODENOSCOPY (EGD) WITH PROPOFOL;  Surgeon: [name], MD;  Location: [facility] ENDOS[facility];  Service: Endoscopy;  Laterality: N/A;   HERNIA REPAIR     INCISIONAL HERNIA REPAIR     INSERT / REPLACE / REMOVE PACEMAKER  06/2007   "took out the old; put in new"   INSERT / REPLACE / REMOVE PACEMAKER  05/10/2014   MDT PPM generator change by Dr [name]   JOINT REPLACEMENT     PARTIAL NEPHRECTOMY Left 05/1974   stone disease   PERMANENT PACEMAKER GENERATOR CHANGE N/A 05/10/2014   Procedure: PERMANENT PACEMAKER GENERATOR CHANGE;  Surgeon: Thompson Grayer, MD;  Location: [facility] CATH LAB;  Service: Cardiovascular;  Laterality: N/A;   TONSILLECTOMY AND ADENOIDECTOMY  1930's   TOTAL KNEE ARTHROPLASTY Right 2001    Allergies  Allergen Reactions   Combigan [Brimonidine Tartrate-Timolol] Other (See Comments)    Systemic malaise amigen eye drop   Tape Other (See Comments)    SKIN IS VERY FRAGILE!!   Penicillins Hives   Breo Ellipta [Fluticasone Furoate-Vilanterol] Other (See Comments) and Hypertension    Increased blood pressure   Fluticasone    Other     SEASONAL ALLERGIES   Timolol    Vancomycin Other (See Comments)    Red man syndrome - Mild redness and discomfort. Able to complete initial dose.     Outpatient Encounter Medications as of 05/20/2022  Medication Sig   albuterol (VENTOLIN HFA) 108 (90 Base) MCG/ACT inhaler Inhale 2 puffs into the lungs every 6 (six) hours as needed for wheezing or shortness of breath.   Azelastine HCl 137 MCG/SPRAY SOLN USE 1 TO 2 SPRAYS INTO BOTH NOSTRILS TWICE DAILY.   budesonide-formoterol (SYMBICORT) 160-4.5 MCG/ACT inhaler Inhale 2 puffs into the lungs 2 (two) times daily.   Calcium 600-10 MG-MCG CHEW Chew 1 capsule by mouth daily.    Cholecalciferol (VITAMIN D) 125 MCG (5000 UT) CAPS Take 1 capsule by mouth daily.   dorzolamide (TRUSOPT) 2 % ophthalmic solution Place 1 drop into both eyes 2 (two) times daily.   DULERA 200-5 MCG/ACT AERO Inhale 2 puffs into the lungs 2 (two) times daily.   famotidine (PEPCID) 40 MG tablet Take 1 tablet (40 mg total) by mouth at bedtime.   fexofenadine (ALLEGRA) 180 MG tablet Take 180 mg by mouth daily.   fluticasone (FLONASE) 50 MCG/ACT nasal spray USE 2 SPRAYS EACH NOSTRIL ONCE A DAY AS NEEDED FOR ALLERGIES OR CONGESTION.   gabapentin (NEURONTIN) 100 MG capsule Take 1 capsule (100 mg total) by mouth at bedtime.   ipratropium-albuterol (DUONEB) 0.5-2.5 (3) MG/3ML SOLN Take 3 mLs by nebulization 3 (three) times daily. And PRN   latanoprost (XALATAN) 0.005 % ophthalmic solution Place 1 drop into both eyes at bedtime.  levothyroxine (SYNTHROID) 75 MCG tablet Take 1 tablet (75 mcg total) by mouth daily.   losartan (COZAAR) 50 MG tablet Take 1 tablet (50 mg total) by mouth daily.   Lutein 20 MG CAPS Take 1 capsule (20 mg total) by mouth daily.   Magnesium 250 MG TABS One by mouth daily as needed.   Magnesium 500 MG TABS Take 750 mg by mouth See admin instructions. Take 750 mg by mouth every evening (in conjunction with 3 BioComplete capsules)   metoprolol tartrate (LOPRESSOR) 50 MG tablet Take 1.5 tablets (75 mg total) by mouth in the morning.   montelukast (SINGULAIR) 10 MG tablet Take 1 tablet (10 mg total) by mouth daily.   Multiple Vitamins-Minerals (PRESERVISION AREDS 2) CAPS Take 1 capsule by mouth in the morning and at bedtime.   NON FORMULARY Take 3 capsules by mouth See admin instructions. Gundry MD Bio Complete 3 - Prebiotic, Probiotic, Postbiotic to Support Optimal Gut Health capsules- TAKE 3 CAPSULES BY MOUTH EVERY EVENING (in conjunction with 750 mg Magnesium)   potassium chloride (KLOR-CON) 10 MEQ tablet Take 2 tablets (20 mEq total) by mouth daily.   predniSONE (DELTASONE) 5 MG  tablet Take 5 mg by mouth daily with breakfast.   Rivaroxaban (XARELTO) 15 MG TABS tablet TAKE 1 TABLET BY MOUTH  DAILY WITH SUPPER   sennosides-docusate sodium (SENOKOT-S) 8.6-50 MG tablet Take 2 tablets by mouth at bedtime.   torsemide (DEMADEX) 20 MG tablet Take 1 tablet (20 mg total) by mouth daily as needed. 3 lb weight gain in 1 day or 5 lb in 1 week or excessive edema   torsemide (DEMADEX) 20 MG tablet Take 1.5 tablets (30 mg total) by mouth daily.   TYLENOL 500 MG tablet Take 500 mg by mouth in the morning.   [DISCONTINUED] diclofenac Sodium (VOLTAREN) 1 % GEL Apply 4 g topically 4 (four) times daily.   [DISCONTINUED] guaiFENesin (MUCINEX) 600 MG 12 hr tablet Take 1 tablet (600 mg total) by mouth 2 (two) times daily.   [DISCONTINUED] latanoprost (XALATAN) 0.005 % ophthalmic solution Place 1 drop into both eyes at bedtime.   [DISCONTINUED] levalbuterol (XOPENEX) 0.63 MG/3ML nebulizer solution Take 0.63 mg by nebulization every 6 (six) hours as needed for wheezing or shortness of breath.   [DISCONTINUED] levalbuterol (XOPENEX) 0.63 MG/3ML nebulizer solution Take 3 mLs (0.63 mg total) by nebulization 3 (three) times daily.   [DISCONTINUED] loratadine (CLARITIN) 10 MG tablet Take 1 tablet (10 mg total) by mouth daily.   [DISCONTINUED] predniSONE (DELTASONE) 10 MG tablet Take 10 mg by mouth daily with breakfast. Taper 40 mg daily for 4 days, 30 mg daily for 3 days, 20 mg daily for 2 days then 10 mg daily for 1 day then back to 5 mg   No facility-administered encounter medications on file as of 05/20/2022.    Review of Systems:  Review of Systems  Constitutional:  Negative for activity change, appetite change, chills, diaphoresis, fatigue, fever and unexpected weight change.  HENT:  Positive for congestion, hearing loss, postnasal drip and rhinorrhea. Negative for nosebleeds, sinus pressure and trouble swallowing.   Respiratory:  Positive for cough and wheezing. Negative for shortness of  breath.   Cardiovascular:  Positive for leg swelling. Negative for chest pain and palpitations.  Gastrointestinal:  Negative for abdominal distention, abdominal pain, constipation and diarrhea.  Genitourinary:  Negative for difficulty urinating and dysuria.  Musculoskeletal:  Positive for arthralgias and gait problem. Negative for back pain, joint swelling and myalgias.  Neurological:  Negative for dizziness, tremors, seizures, syncope, facial asymmetry, speech difficulty, weakness, light-headedness, numbness and headaches.  Psychiatric/Behavioral:  Negative for agitation, behavioral problems and confusion.     Health Maintenance  Topic Date Due   Medicare Annual Wellness (AWV)  03/19/2023   DTaP/Tdap/Td (2 - Td or Tdap) 02/23/2029   Pneumonia Vaccine 63+ Years old  Completed   INFLUENZA VACCINE  Completed   COVID-19 Vaccine  Completed   Zoster Vaccines- Shingrix  Completed   HPV VACCINES  Aged Out   DEXA SCAN  Discontinued    Physical Exam: Vitals:   05/20/22 1333  BP: 134/86  Pulse: 64  Temp: (!) 97.3 F (36.3 C)  SpO2: 98%  Weight: 132 lb 12.8 oz (60.2 kg)  Height: 5' 2"$  (1.575 m)   Body mass index is 24.29 kg/m. Wt Readings from Last 3 Encounters:  05/20/22 132 lb 12.8 oz (60.2 kg)  04/23/22 129 lb (58.5 kg)  04/18/22 127 lb 12.8 oz (58 kg)    Physical Exam Vitals and nursing note reviewed.  Constitutional:      General: She is not in acute distress.    Appearance: She is not diaphoretic.  HENT:     Head: Normocephalic and atraumatic.     Right Ear: Tympanic membrane and ear canal normal.     Left Ear: Tympanic membrane and ear canal normal.     Nose: Congestion and rhinorrhea present.     Mouth/Throat:     Mouth: Mucous membranes are moist.     Pharynx: Posterior oropharyngeal erythema present.     Comments: Clear drainage Neck:     Vascular: No JVD.  Cardiovascular:     Rate and Rhythm: Normal rate and regular rhythm.     Heart sounds: No murmur  heard. Pulmonary:     Effort: Pulmonary effort is normal. No respiratory distress.     Breath sounds: Wheezing and rhonchi present.  Musculoskeletal:     Comments: BLE edema +1  Skin:    General: Skin is warm and dry.  Neurological:     Mental Status: She is alert and oriented to person, place, and time.     Labs reviewed: Basic Metabolic Panel: Recent Labs    06/28/21 0000 11/15/21 1531 11/29/21 0000 04/08/22 1045 04/09/22 0501 04/15/22 0000  NA 145  145 146   < > 139 136 143  K 4.0  4.0 3.8   < > 3.0* 3.6 3.6  CL 101  101 98*   < > 101 101 101  CO2 30*  30* 28*   < > 26 24 32*  GLUCOSE  --   --   --  142* 196*  --   BUN 25*  25* 49*   < > 39* 39* 29*  CREATININE 1.0  1.0 1.5*   < > 1.43* 1.39* 1.2*  CALCIUM 10.1  10.1 10.1   < > 8.9 8.8* 9.5  MG 2.6  2.6  --   --   --   --   --   TSH  --  2.57  --   --   --   --    < > = values in this interval not displayed.   Liver Function Tests: Recent Labs    11/15/21 1531 03/21/22 0000 04/08/22 1045  AST 35 34  34 22  ALT 41* 37*  37* 15  ALKPHOS 60 53  53 42  BILITOT  --   --  1.4*  PROT  --   --  6.2*  ALBUMIN 4.4 4.1  4.1 4.0   No results for input(s): "LIPASE", "AMYLASE" in the last 8760 hours. No results for input(s): "AMMONIA" in the last 8760 hours. CBC: Recent Labs    04/08/22 1045 04/09/22 0501 04/15/22 0000  WBC 12.8* 9.8 10.8  10.8  NEUTROABS 8.7*  --  8.00  HGB 12.0 11.5* 12.0  12.0  HCT 38.4 36.7 37  37  MCV 100.0 100.3*  --   PLT 223 217 330  330   Lipid Panel: No results for input(s): "CHOL", "HDL", "LDLCALC", "TRIG", "CHOLHDL", "LDLDIRECT" in the last 8760 hours. Lab Results  Component Value Date   HGBA1C 5.7 (H) 06/25/2017    Procedures since last visit: No results found.  Assessment/Plan  1. COVID-19 virus infection Add molnupiravir (on DOAC) Isolation x 5 days, mask x 5 days Increase prednisone to 20 mg qd x 5 days then return to 5 mg daily Duoneb tid x 7 days  then back to prn  Discussed with clinic nurse Nira Conn Let pt know to call us if she is not improving or worsening.   2. Acute pain of left knee Slight improvement, followed by ortho   3. Sick sinus syndrome [facility]) S/p pacemaker.  Followed by cardiology   4. Essential hypertension Controlled  5. Chronic diastolic congestive heart failure (HCC) Continue torsemide 30 mg daily and prn weight gain On ARB therapy.  Weight is up but she went through a period of weight loss due to an acute illness and feels like she is back to baseline. No other signs of CHF.  6. Acquired hypothyroidism Lab Results  Component Value Date   TSH 2.57 11/15/2021   Continue synthroid   7. Stage 3b chronic kidney disease ([place]) Lab Results  Component Value Date   BUN 29 (A) 04/15/2022   Lab Results  Component Value Date   CREATININE 1.2 (A) 04/15/2022   Continue to periodically monitor BMP and avoid nephrotoxic agents    Labs/tests ordered:  * No order type specified *NA Next appt:  1 month with Dr Lyndel Safe    Total time 35mn:  time greater than 50% of total time spent doing pt counseling and coordination of care

## 2022-05-20 ENCOUNTER — Non-Acute Institutional Stay: Payer: Medicare Other | Admitting: Adult Health

## 2022-05-20 ENCOUNTER — Encounter: Payer: Self-pay | Admitting: Adult Health

## 2022-05-20 VITALS — BP 134/86 | HR 64 | Temp 97.3°F | Ht 62.0 in | Wt 132.8 lb

## 2022-05-20 DIAGNOSIS — I5032 Chronic diastolic (congestive) heart failure: Secondary | ICD-10-CM | POA: Diagnosis not present

## 2022-05-20 DIAGNOSIS — I495 Sick sinus syndrome: Secondary | ICD-10-CM

## 2022-05-20 DIAGNOSIS — U071 COVID-19: Secondary | ICD-10-CM

## 2022-05-20 DIAGNOSIS — M25562 Pain in left knee: Secondary | ICD-10-CM

## 2022-05-20 DIAGNOSIS — N1832 Chronic kidney disease, stage 3b: Secondary | ICD-10-CM | POA: Diagnosis not present

## 2022-05-20 DIAGNOSIS — E039 Hypothyroidism, unspecified: Secondary | ICD-10-CM

## 2022-05-20 DIAGNOSIS — I10 Essential (primary) hypertension: Secondary | ICD-10-CM

## 2022-05-20 LAB — NOVEL CORONAVIRUS, NAA: SARS-CoV-2, NAA: POSITIVE

## 2022-05-20 MED ORDER — MOLNUPIRAVIR EUA 200MG CAPSULE: 4.0000 | ORAL_CAPSULE | Freq: Two times a day (BID) | ORAL | 0 refills | Status: AC

## 2022-05-20 MED ORDER — PREDNISONE 5 MG PO TABS: 5.0000 mg | ORAL_TABLET | Freq: Every day | ORAL | 0 refills | Status: DC

## 2022-05-20 MED ORDER — PREDNISONE 20 MG PO TABS: 20.0000 mg | ORAL_TABLET | Freq: Every day | ORAL | 0 refills | Status: AC

## 2022-05-20 NOTE — Patient Instructions (Signed)
You have tested positive for Covid   You should stay in your home, on isolation for 5 days then you can go out as long as you are improving with a mask on. The mask should be worn for 5 days. Your first day of isolation is 2/10.    We will put you on molnupiravir and increase your prednisone for 5 days to help treat the infection  Drink fluids, rest  Take the duonebs three times daily for 7 days.   F/U with Dr Lyndel Safe in 1 month  Call us if you are not improving

## 2022-05-21 ENCOUNTER — Other Ambulatory Visit: Payer: Self-pay | Admitting: Orthopedic Surgery

## 2022-05-21 DIAGNOSIS — I5031 Acute diastolic (congestive) heart failure: Secondary | ICD-10-CM

## 2022-05-21 MED ORDER — TORSEMIDE 20 MG PO TABS: 30.0000 mg | ORAL_TABLET | Freq: Every day | ORAL | 0 refills | Status: DC

## 2022-05-21 MED ORDER — TORSEMIDE 20 MG PO TABS: 20.0000 mg | ORAL_TABLET | Freq: Every day | ORAL | 3 refills | Status: DC | PRN

## 2022-05-24 ENCOUNTER — Telehealth: Payer: Self-pay | Admitting: Adult Health

## 2022-05-24 MED ORDER — DOXYCYCLINE HYCLATE 100 MG PO TABS: 100.0000 mg | ORAL_TABLET | Freq: Two times a day (BID) | ORAL | 0 refills | Status: AC

## 2022-05-24 NOTE — Telephone Encounter (Signed)
Pt is wheezing and having green sputum. Sats are Ok. Diagnosed with Covid infection 2/12.  Has recurrent resp infections with hx of chronic bronchitis. Not doing well and requesting antibiotic. Sats are ok on RA. Vitals ok. ON prednisone taper. Using nebs tid. Will order doxycycline

## 2022-05-28 ENCOUNTER — Non-Acute Institutional Stay: Payer: Medicare Other | Admitting: Internal Medicine

## 2022-05-28 ENCOUNTER — Encounter: Payer: Self-pay | Admitting: Internal Medicine

## 2022-05-28 VITALS — BP 130/68 | HR 76 | Temp 97.6°F | Resp 18 | Ht 62.0 in | Wt 127.9 lb

## 2022-05-28 DIAGNOSIS — J441 Chronic obstructive pulmonary disease with (acute) exacerbation: Secondary | ICD-10-CM | POA: Diagnosis not present

## 2022-05-28 DIAGNOSIS — I5031 Acute diastolic (congestive) heart failure: Secondary | ICD-10-CM

## 2022-05-28 DIAGNOSIS — I5032 Chronic diastolic (congestive) heart failure: Secondary | ICD-10-CM | POA: Diagnosis not present

## 2022-05-28 DIAGNOSIS — U099 Post covid-19 condition, unspecified: Secondary | ICD-10-CM

## 2022-05-28 MED ORDER — PREDNISONE 10 MG PO TABS: 10.0000 mg | ORAL_TABLET | Freq: Every day | ORAL | 0 refills | Status: DC

## 2022-05-28 NOTE — Progress Notes (Signed)
Location: [place] of Service:  Clinic (12)  Provider:   Code Status: DNR Goals of Care:     05/28/2022    1:44 PM  Advanced Directives  Does Patient Have a Medical Advance Directive? Yes  Type of [place] of [place];Living will;Out of facility DNR (pink MOST or yellow form)  Does patient want to make changes to medical advance directive? No - Patient declined  Copy of Jody Blake in Chart? Yes - validated most recent copy scanned in chart (See row information)  Pre-existing out of facility DNR order (yellow form or pink MOST form) Pink MOST form placed in chart (order not valid for inpatient use);Yellow form placed in chart (order not valid for inpatient use)     Chief Complaint  Patient presents with   Acute Visit    Patient states she is needed to be seen for wheezing , coughing and congestion    HPI: Patient is a 89 y.o. female seen today for an acute visit for Cough Wheezing and Congestion  [name]   Patient has h/o A. fib on Xarelto s/p AV nodal ablation, s/p PPM in 2009 Aortic stenosis  COPD with frequent exacerbations.  Not on oxygen.  On chronic prednisone.  Follows closely with Dr. Annamaria Blake.  Is enrolled in palliative care Chronic diastolic CHF  History of chronic venous insufficiency Vision loss due to macular degeneration and glaucoma Very HOH Osteopenia  Pneumonia in 01/24 Diagnosed with Covid on 05/20/22 treated with Molnupiravir  Now having Cough and sputum Was started on Doxycyline But continues to have Cough and Wheezing Some SOB  No fever No chest pain No LE swelling No Dizziness Eating well No Weakness        Past Medical History:  Diagnosis Date   Anemia    Aortic stenosis    a. Echo 09/06/12 EF 55-60%, no WMA, G2DD, Ao valve sclerosis w/ mod stenosis, LA mildly dilated, PA pressure 21mHg   Asthmatic bronchitis    Complete heart block ([place]    s/p  permanent pacemaker 06/27/1999 (Battery change 06/2007 and 2016).  s/p AV nodal ablation by Dr ARayann Heman2016.   COPD (chronic obstructive pulmonary disease) (HCC)    Dr. YAnnamaria Blake  DDD (degenerative disc disease)    Diastolic dysfunction    a. Echo 09/06/12 EF 55-60%, no WMA, G2DD, Ao valve sclerosis w/ mod stenosis, LA mildly dilated, PA pressure 426mg   Diverticulosis    DVT (deep vein thrombosis) in pregnancy 1954   a. LLE   Hypertension    Hypothyroidism    on medication   Macular degeneration    Dr. KuEliezer Blake Osteoarthrosis, unspecified whether generalized or localized, other specified sites    [place]   Stopped flecainide, on amiodarone but still has bouts of A FIB (mostly in mornings).    Pure hypercholesterolemia    Staghorn calculus    Left    Past Surgical History:  Procedure Laterality Date   APPENDECTOMY  ~ 1941   AV NODE ABLATION  05/10/2014   AV NODE ABLATION N/A 05/10/2014   Procedure: AV NODE ABLATION;  Surgeon: Jody GrayerMD;  [facility];  Service: Cardiovascular;  Laterality: N/A;   BUNIONECTOMY WITH HAMMERTOE RECONSTRUCTION Bilateral ~ [employer]03/21/01   Medtronic PM implanted by Dr EdLeonia Blake CARDIOVERSION N/A 12/02/2013   Procedure: CARDIOVERSION;  Surgeon: Jody Records, MD;  [facility]: [facility];  Service: Cardiovascular;  Laterality: N/A;   CATARACT EXTRACTION W/ INTRAOCULAR LENS  IMPLANT, BILATERAL Bilateral    COLONOSCOPY WITH PROPOFOL N/A 04/22/2013   Procedure: COLONOSCOPY WITH PROPOFOL;  Surgeon: Jody Fair, MD;  Location: [facility] ENDOSCOPY;  Service: Endoscopy;  Laterality: N/A;   DILATION AND CURETTAGE OF UTERUS  X 2   "when I was going thru menopause"   ESOPHAGOGASTRODUODENOSCOPY (EGD) WITH PROPOFOL N/A 04/22/2013   Procedure: ESOPHAGOGASTRODUODENOSCOPY (EGD) WITH PROPOFOL;  Surgeon: Jody Fair, MD;  Location: [facility] ENDOSCOPY;  Service: Endoscopy;  Laterality: N/A;   HERNIA REPAIR      INCISIONAL HERNIA REPAIR     INSERT / REPLACE / REMOVE PACEMAKER  06/2007   "took out the old; put in new"   INSERT / REPLACE / REMOVE PACEMAKER  05/10/2014   MDT PPM generator change by Dr Jody Blake   JOINT REPLACEMENT     PARTIAL NEPHRECTOMY Left 05/1974   stone disease   PERMANENT PACEMAKER GENERATOR CHANGE N/A 05/10/2014   Procedure: PERMANENT PACEMAKER GENERATOR CHANGE;  Surgeon: Jody Grayer, MD;  [facility];  Service: Cardiovascular;  Laterality: N/A;   TONSILLECTOMY AND ADENOIDECTOMY  1930's   TOTAL KNEE ARTHROPLASTY Right 2001    Allergies  Allergen Reactions   Combigan [Brimonidine Tartrate-Timolol] Other (See Comments)    Systemic malaise amigen eye drop   Tape Other (See Comments)    SKIN IS VERY FRAGILE!!   Penicillins Hives   Breo Ellipta [Fluticasone Furoate-Vilanterol] Other (See Comments) and Hypertension    Increased blood pressure   Fluticasone    Other     SEASONAL ALLERGIES   Timolol    Vancomycin Other (See Comments)    Red man syndrome - Mild redness and discomfort. Able to complete initial dose.     Outpatient Encounter Medications as of 05/28/2022  Medication Sig   albuterol (VENTOLIN HFA) 108 (90 Base) MCG/ACT inhaler Inhale 2 puffs into the lungs every 6 (six) hours as needed for wheezing or shortness of breath.   Azelastine HCl 137 MCG/SPRAY SOLN USE 1 TO 2 SPRAYS INTO BOTH NOSTRILS TWICE DAILY.   budesonide-formoterol (SYMBICORT) 160-4.5 MCG/ACT inhaler Inhale 2 puffs into the lungs 2 (two) times daily.   Calcium 600-10 MG-MCG CHEW Chew 1 capsule by mouth daily.   Cholecalciferol (VITAMIN D) 125 MCG (5000 UT) CAPS Take 1 capsule by mouth daily.   dorzolamide (TRUSOPT) 2 % ophthalmic solution Place 1 drop into both eyes 2 (two) times daily.   doxycycline (VIBRA-TABS) 100 MG tablet Take 1 tablet (100 mg total) by mouth 2 (two) times daily for 7 days.   DULERA 200-5 MCG/ACT AERO Inhale 2 puffs into the lungs 2 (two) times daily.   famotidine  (PEPCID) 40 MG tablet Take 1 tablet (40 mg total) by mouth at bedtime.   fexofenadine (ALLEGRA) 180 MG tablet Take 180 mg by mouth daily.   fluticasone (FLONASE) 50 MCG/ACT nasal spray USE 2 SPRAYS EACH NOSTRIL ONCE A DAY AS NEEDED FOR ALLERGIES OR CONGESTION.   gabapentin (NEURONTIN) 100 MG capsule Take 1 capsule (100 mg total) by mouth at bedtime.   ipratropium-albuterol (DUONEB) 0.5-2.5 (3) MG/3ML SOLN Take 3 mLs by nebulization 3 (three) times daily. And PRN   latanoprost (XALATAN) 0.005 % ophthalmic solution Place 1 drop into both eyes at bedtime.   levothyroxine (SYNTHROID) 75 MCG tablet Take 1 tablet (75 mcg total) by mouth daily.   losartan (COZAAR) 50 MG  tablet Take 1 tablet (50 mg total) by mouth daily.   Lutein 20 MG CAPS Take 1 capsule (20 mg total) by mouth daily.   Magnesium 250 MG TABS One by mouth daily as needed.   Magnesium 500 MG TABS Take 750 mg by mouth See admin instructions. Take 750 mg by mouth every evening (in conjunction with 3 BioComplete capsules)   metoprolol tartrate (LOPRESSOR) 50 MG tablet Take 1.5 tablets (75 mg total) by mouth in the morning.   montelukast (SINGULAIR) 10 MG tablet Take 1 tablet (10 mg total) by mouth daily.   Multiple Vitamins-Minerals (PRESERVISION AREDS 2) CAPS Take 1 capsule by mouth in the morning and at bedtime.   NON FORMULARY Take 3 capsules by mouth See admin instructions. Gundry MD Bio Complete 3 - Prebiotic, Probiotic, Postbiotic to Support Optimal Gut Health capsules- TAKE 3 CAPSULES BY MOUTH EVERY EVENING (in conjunction with 750 mg Magnesium)   potassium chloride (KLOR-CON) 10 MEQ tablet Take 2 tablets (20 mEq total) by mouth daily.   predniSONE (DELTASONE) 10 MG tablet Take 1 tablet (10 mg total) by mouth daily with breakfast.   [START ON 06/10/2022] predniSONE (DELTASONE) 5 MG tablet Take 5 mg by mouth daily with breakfast.   Rivaroxaban (XARELTO) 15 MG TABS tablet TAKE 1 TABLET BY MOUTH  DAILY WITH SUPPER   sennosides-docusate  sodium (SENOKOT-S) 8.6-50 MG tablet Take 2 tablets by mouth at bedtime.   torsemide (DEMADEX) 20 MG tablet Take 1.5 tablets (30 mg total) by mouth daily.   torsemide (DEMADEX) 20 MG tablet Take 1 tablet (20 mg total) by mouth daily as needed. 3 lb weight gain in 1 day or 5 lb in 1 week or excessive edema   TYLENOL 500 MG tablet Take 500 mg by mouth in the morning.   [DISCONTINUED] predniSONE (DELTASONE) 5 MG tablet Take 1 tablet (5 mg total) by mouth daily with breakfast.   No facility-administered encounter medications on file as of 05/28/2022.    Review of Systems:  Review of Systems  Constitutional:  Negative for activity change and appetite change.  HENT: Negative.    Respiratory:  Positive for cough and shortness of breath.   Cardiovascular:  Negative for leg swelling.  Gastrointestinal:  Negative for constipation.  Genitourinary: Negative.   Musculoskeletal:  Positive for gait problem. Negative for arthralgias and myalgias.  Skin: Negative.   Neurological:  Negative for dizziness and weakness.  Psychiatric/Behavioral:  Negative for confusion, dysphoric mood and sleep disturbance.     Health Maintenance  Topic Date Due   Medicare Annual Wellness (AWV)  03/19/2023   DTaP/Tdap/Td (2 - Td or Tdap) 02/23/2029   Pneumonia Vaccine 71+ Years old  Completed   INFLUENZA VACCINE  Completed   COVID-19 Vaccine  Completed   Zoster Vaccines- Shingrix  Completed   HPV VACCINES  Aged Out   DEXA SCAN  Discontinued    Physical Exam: Vitals:   05/28/22 1346  BP: 130/68  Pulse: 76  Resp: 18  Temp: 97.6 F (36.4 C)  TempSrc: Temporal  SpO2: 95%  Weight: 127 lb 14.4 oz (58 kg)  Height: 5' 2"$  (1.575 m)   Body mass index is 23.39 kg/m. Physical Exam Vitals reviewed.  Constitutional:      Appearance: Normal appearance.  HENT:     Head: Normocephalic.     Nose: Nose normal.     Mouth/Throat:     Mouth: Mucous membranes are moist.     Pharynx: Oropharynx is clear.  Eyes:  Pupils: Pupils are equal, round, and reactive to light.  Cardiovascular:     Rate and Rhythm: Normal rate and regular rhythm.     Pulses: Normal pulses.     Heart sounds: Normal heart sounds. No murmur heard. Pulmonary:     Effort: Pulmonary effort is normal.     Breath sounds: Wheezing present.  Abdominal:     General: Abdomen is flat. Bowel sounds are normal.     Palpations: Abdomen is soft.  Musculoskeletal:     Cervical back: Neck supple.     Comments: Mild Swelling bilateral   Skin:    General: Skin is warm.  Neurological:     General: No focal deficit present.     Mental Status: She is alert and oriented to person, place, and time.  Psychiatric:        Mood and Affect: Mood normal.        Thought Content: Thought content normal.     Labs reviewed: Basic Metabolic Panel: Recent Labs    06/28/21 0000 11/15/21 1531 11/29/21 0000 04/08/22 1045 04/09/22 0501 04/15/22 0000  NA 145  145 146   < > 139 136 143  K 4.0  4.0 3.8   < > 3.0* 3.6 3.6  CL 101  101 98*   < > 101 101 101  CO2 30*  30* 28*   < > 26 24 32*  GLUCOSE  --   --   --  142* 196*  --   BUN 25*  25* 49*   < > 39* 39* 29*  CREATININE 1.0  1.0 1.5*   < > 1.43* 1.39* 1.2*  CALCIUM 10.1  10.1 10.1   < > 8.9 8.8* 9.5  MG 2.6  2.6  --   --   --   --   --   TSH  --  2.57  --   --   --   --    < > = values in this interval not displayed.   Liver Function Tests: Recent Labs    11/15/21 1531 03/21/22 0000 04/08/22 1045  AST 35 34  34 22  ALT 41* 37*  37* 15  ALKPHOS 60 53  53 42  BILITOT  --   --  1.4*  PROT  --   --  6.2*  ALBUMIN 4.4 4.1  4.1 4.0   No results for input(s): "LIPASE", "AMYLASE" in the last 8760 hours. No results for input(s): "AMMONIA" in the last 8760 hours. CBC: Recent Labs    04/08/22 1045 04/09/22 0501 04/15/22 0000  WBC 12.8* 9.8 10.8  10.8  NEUTROABS 8.7*  --  8.00  HGB 12.0 11.5* 12.0  12.0  HCT 38.4 36.7 37  37  MCV 100.0 100.3*  --   PLT 223 217 330   330   Lipid Panel: No results for input(s): "CHOL", "HDL", "LDLCALC", "TRIG", "CHOLHDL", "LDLDIRECT" in the last 8760 hours. Lab Results  Component Value Date   HGBA1C 5.7 (H) 06/25/2017    Procedures since last visit: No results found.  Assessment/Plan 1. COPD with acute exacerbation ([place]) Already on Doxycyline Will start her on Prednisone 40 mg Taper over 14 days Can take Extra Nebs if needed Will Let us know if symptoms not better in few days 2. Chronic diastolic CHF (congestive heart failure) (HCC) On Torsemide   3. Post-COVID-19 condition Got treatment  Other issue Sick sinus syndrome ([place])      Permanent atrial fibrillation ([place]) Xarelto and  Lopressor    Essential hypertension Cozaar and Lopressor    Gastroesophageal reflux disease, unspecified whether esophagitis present Pepcid   Acquired hypothyroidism TSH normal in 08/23   Stage 3a chronic kidney disease (HCC) Creat stable    Bilateral nonexudative age-related macular degeneration, unspecified stage Has Caregivers in her apartment    Sensorineural hearing loss (SNHL) of both ears      Labs/tests ordered:   Next appt:  06/10/2022

## 2022-06-06 ENCOUNTER — Ambulatory Visit (INDEPENDENT_AMBULATORY_CARE_PROVIDER_SITE_OTHER): Payer: Medicare Other

## 2022-06-06 DIAGNOSIS — I442 Atrioventricular block, complete: Secondary | ICD-10-CM

## 2022-06-06 LAB — CUP PACEART REMOTE DEVICE CHECK
Battery Impedance: 981 Ohm
Battery Remaining Longevity: 68 mo
Battery Voltage: 2.78 V
Brady Statistic RV Percent Paced: 96 %
Date Time Interrogation Session: 20240229085118
Implantable Lead Connection Status: 753985
Implantable Lead Connection Status: 753985
Implantable Lead Implant Date: 20090318
Implantable Lead Implant Date: 20090318
Implantable Lead Location: 753859
Implantable Lead Location: 753860
Implantable Lead Model: 5076
Implantable Lead Model: 5092
Implantable Pulse Generator Implant Date: 20160202
Lead Channel Impedance Value: 67 Ohm
Lead Channel Impedance Value: 750 Ohm
Lead Channel Pacing Threshold Amplitude: 1 V
Lead Channel Pacing Threshold Pulse Width: 0.4 ms
Lead Channel Setting Pacing Amplitude: 2.5 V
Lead Channel Setting Pacing Pulse Width: 0.4 ms
Lead Channel Setting Sensing Sensitivity: 4 mV
Zone Setting Status: 755011
Zone Setting Status: 755011

## 2022-06-10 ENCOUNTER — Non-Acute Institutional Stay: Payer: Medicare Other | Admitting: Adult Health

## 2022-06-10 ENCOUNTER — Encounter: Payer: Self-pay | Admitting: Adult Health

## 2022-06-10 VITALS — BP 124/64 | HR 73 | Temp 97.6°F | Resp 18 | Ht 62.0 in | Wt 132.8 lb

## 2022-06-10 DIAGNOSIS — R635 Abnormal weight gain: Secondary | ICD-10-CM | POA: Diagnosis not present

## 2022-06-10 DIAGNOSIS — M7989 Other specified soft tissue disorders: Secondary | ICD-10-CM | POA: Diagnosis not present

## 2022-06-10 DIAGNOSIS — J441 Chronic obstructive pulmonary disease with (acute) exacerbation: Secondary | ICD-10-CM | POA: Diagnosis not present

## 2022-06-10 MED ORDER — TORSEMIDE 20 MG PO TABS: 20.0000 mg | ORAL_TABLET | Freq: Every day | ORAL | 0 refills | Status: DC

## 2022-06-10 NOTE — Patient Instructions (Signed)
Please take an extra dose of torsemide 20 mg daily for three days, in addition to the regular 30 mg daily for a total of 50 mg daily for three days.

## 2022-06-10 NOTE — Progress Notes (Addendum)
Location:  [facility]  POS: Clinic  Provider: Royal Hawthorn, ANP  Code Status: DNR Goals of Care:     06/10/2022    3:11 PM  Advanced Directives  Does Patient Have a Medical Advance Directive? Yes  Type of Paramedic of [place];Living will;Out of facility DNR (pink MOST or yellow form)  Does patient want to make changes to medical advance directive? No - Patient declined  Copy of [place] in Chart? Yes - validated most recent copy scanned in chart (See row information)  Pre-existing out of facility DNR order (yellow form or pink MOST form) Pink MOST form placed in chart (order not valid for inpatient use);Yellow form placed in chart (order not valid for inpatient use)     Chief Complaint  Patient presents with   Medical Management of Chronic Issues    2 week follow up and would like to discuss weight, leg bruising and wound     HPI: Patient is a 89 y.o. female seen today for medical management of chronic diseases.    PMH significant for pacemaker due to SSS, afib, HTN, chronic diastolic CHF, COPD, chronic bronchitis, GERD, OA, CKD, HOH, glaucoma, macular degeneration, blindness, constipation.  Found to have Covid on 05/20/22 with cough and congestion Prednisone increased, given molnupiravir, and duonebs added tid Called the office on 05/24/22 due to worsening cough, sob, congestion doxycycline added. Seen by Dr Lyndel Safe 2/20 prednisone increased to 40 mg with taper over 14 days for cough, wheeze and sob.  Here for 2 week f/u.  Has frequent exacerbations, does not require oxygen.  Reports she is not sob, has minimal cough with sputum production. Feels much better.  Reports a large amt of bruising to the left thigh started one week ago and began spreading at the knee and down to the calf. Large lump in posterior knee. No pain. Able to walk with walker. Did report a fall mechanical on 2/29 but this was after the bruising started and she  fell on her left hip but did not have any pain or change in gait.  Also is using a knee brace started this 3/2 for knee stability. Had left knee injection one month ago for left knee arthritis. Knee pain is improved.   Past Medical History:  Diagnosis Date   Anemia    Aortic stenosis    a. Echo 09/06/12 EF 55-60%, no WMA, G2DD, Ao valve sclerosis w/ mod stenosis, LA mildly dilated, PA pressure 60mHg   Asthmatic bronchitis    Complete heart block (HCC)    s/p permanent pacemaker 06/27/1999 (Battery change 06/2007 and 2016).  s/p AV nodal ablation by Dr ARayann Heman2016.   COPD (chronic obstructive pulmonary disease) (HCC)    Dr. YAnnamaria Boots  DDD (degenerative disc disease)    Diastolic dysfunction    a. Echo 09/06/12 EF 55-60%, no WMA, G2DD, Ao valve sclerosis w/ mod stenosis, LA mildly dilated, PA pressure 434mg   Diverticulosis    DVT (deep vein thrombosis) in pregnancy 1954   a. LLE   Hypertension    Hypothyroidism    on medication   Macular degeneration    Dr. KuEliezer Bottom Osteoarthrosis, unspecified whether generalized or localized, other specified sites    [place]F [place]) [place]   Stopped flecainide, on amiodarone but still has bouts of A FIB (mostly in mornings).    Pure hypercholesterolemia    Staghorn calculus    Left    Past Surgical  History:  Procedure Laterality Date   APPENDECTOMY  ~ 1941   AV NODE ABLATION  05/10/2014   AV NODE ABLATION N/A 05/10/2014   Procedure: AV NODE ABLATION;  Surgeon: Thompson Grayer, MD;  Location: [facility] CATH LAB;  Service: Cardiovascular;  Laterality: N/A;   BUNIONECTOMY WITH HAMMERTOE RECONSTRUCTION Bilateral ~ [place]  06/27/99   Medtronic PM implanted by Dr [name]   CARDIOVERSION N/A 12/02/2013   Procedure: CARDIOVERSION;  Surgeon: [name], MD;  Location: [place];  Service: Cardiovascular;  Laterality: N/A;   CATARACT EXTRACTION W/ INTRAOCULAR LENS  IMPLANT, BILATERAL Bilateral    COLONOSCOPY WITH  PROPOFOL N/A 04/22/2013   Procedure: COLONOSCOPY WITH PROPOFOL;  Surgeon: [name], MD;  Location: [facility] ENDOSCOPY;  Service: Endoscopy;  Laterality: N/A;   DILATION AND CURETTAGE OF UTERUS  X 2   "when I was going thru menopause"   ESOPHAGOGASTRODUODENOSCOPY (EGD) WITH PROPOFOL N/A 04/22/2013   Procedure: ESOPHAGOGASTRODUODENOSCOPY (EGD) WITH PROPOFOL;  Surgeon: [name], MD;  Location: [facility] ENDOSCOPY;  Service: Endoscopy;  Laterality: N/A;   HERNIA REPAIR     INCISIONAL HERNIA REPAIR     INSERT / REPLACE / REMOVE PACEMAKER  06/2007   "took out the old; put in new"   INSERT / REPLACE / REMOVE PACEMAKER  05/10/2014   MDT PPM generator change by Dr [name]   JOINT REPLACEMENT     PARTIAL NEPHRECTOMY Left 05/1974   stone disease   PERMANENT PACEMAKER GENERATOR CHANGE N/A 05/10/2014   Procedure: PERMANENT PACEMAKER GENERATOR CHANGE;  Surgeon: Thompson Grayer, MD;  Location: [facility] CATH LAB;  Service: Cardiovascular;  Laterality: N/A;   TONSILLECTOMY AND ADENOIDECTOMY  1930's   TOTAL KNEE ARTHROPLASTY Right 2001    Allergies  Allergen Reactions   Combigan [Brimonidine Tartrate-Timolol] Other (See Comments)    Systemic malaise amigen eye drop   Tape Other (See Comments)    SKIN IS VERY FRAGILE!!   Penicillins Hives   Breo Ellipta [Fluticasone Furoate-Vilanterol] Other (See Comments) and Hypertension    Increased blood pressure   Fluticasone    Other     SEASONAL ALLERGIES   Timolol    Vancomycin Other (See Comments)    Red man syndrome - Mild redness and discomfort. Able to complete initial dose.     Outpatient Encounter Medications as of 06/10/2022  Medication Sig   albuterol (VENTOLIN HFA) 108 (90 Base) MCG/ACT inhaler Inhale 2 puffs into the lungs every 6 (six) hours as needed for wheezing or shortness of breath.   Azelastine HCl 137 MCG/SPRAY SOLN USE 1 TO 2 SPRAYS INTO BOTH NOSTRILS TWICE DAILY.   budesonide-formoterol (SYMBICORT) 160-4.5 MCG/ACT inhaler Inhale 2 puffs into the  lungs 2 (two) times daily.   Calcium 600-10 MG-MCG CHEW Chew 1 capsule by mouth daily.   Cholecalciferol (VITAMIN D) 125 MCG (5000 UT) CAPS Take 1 capsule by mouth daily.   dorzolamide (TRUSOPT) 2 % ophthalmic solution Place 1 drop into both eyes 2 (two) times daily.   DULERA 200-5 MCG/ACT AERO Inhale 2 puffs into the lungs 2 (two) times daily.   famotidine (PEPCID) 40 MG tablet Take 1 tablet (40 mg total) by mouth at bedtime.   fexofenadine (ALLEGRA) 180 MG tablet Take 180 mg by mouth daily.   fluticasone (FLONASE) 50 MCG/ACT nasal spray USE 2 SPRAYS EACH NOSTRIL ONCE A DAY AS NEEDED FOR ALLERGIES OR CONGESTION.   gabapentin (NEURONTIN) 100 MG capsule Take 1 capsule (100 mg total) by mouth  at bedtime.   ipratropium-albuterol (DUONEB) 0.5-2.5 (3) MG/3ML SOLN Take 3 mLs by nebulization 3 (three) times daily. And PRN   latanoprost (XALATAN) 0.005 % ophthalmic solution Place 1 drop into both eyes at bedtime.   levothyroxine (SYNTHROID) 75 MCG tablet Take 1 tablet (75 mcg total) by mouth daily.   losartan (COZAAR) 50 MG tablet Take 1 tablet (50 mg total) by mouth daily.   Lutein 20 MG CAPS Take 1 capsule (20 mg total) by mouth daily.   Magnesium 250 MG TABS One by mouth daily as needed.   Magnesium 500 MG TABS Take 750 mg by mouth See admin instructions. Take 750 mg by mouth every evening (in conjunction with 3 BioComplete capsules)   metoprolol tartrate (LOPRESSOR) 50 MG tablet Take 1.5 tablets (75 mg total) by mouth in the morning.   montelukast (SINGULAIR) 10 MG tablet Take 1 tablet (10 mg total) by mouth daily.   Multiple Vitamins-Minerals (PRESERVISION AREDS 2) CAPS Take 1 capsule by mouth in the morning and at bedtime.   NON FORMULARY Take 3 capsules by mouth See admin instructions. Gundry MD Bio Complete 3 - Prebiotic, Probiotic, Postbiotic to Support Optimal Gut Health capsules- TAKE 3 CAPSULES BY MOUTH EVERY EVENING (in conjunction with 750 mg Magnesium)   potassium chloride (KLOR-CON) 10  MEQ tablet Take 2 tablets (20 mEq total) by mouth daily.   predniSONE (DELTASONE) 10 MG tablet Take 1 tablet (10 mg total) by mouth daily with breakfast.   predniSONE (DELTASONE) 5 MG tablet Take 5 mg by mouth daily with breakfast.   Rivaroxaban (XARELTO) 15 MG TABS tablet TAKE 1 TABLET BY MOUTH  DAILY WITH SUPPER   sennosides-docusate sodium (SENOKOT-S) 8.6-50 MG tablet Take 2 tablets by mouth at bedtime.   torsemide (DEMADEX) 20 MG tablet Take 1.5 tablets (30 mg total) by mouth daily.   torsemide (DEMADEX) 20 MG tablet Take 1 tablet (20 mg total) by mouth daily as needed. 3 lb weight gain in 1 day or 5 lb in 1 week or excessive edema   TYLENOL 500 MG tablet Take 500 mg by mouth in the morning.   No facility-administered encounter medications on file as of 06/10/2022.    Review of Systems:  Review of Systems  Constitutional:  Positive for unexpected weight change. Negative for appetite change, chills, diaphoresis, fatigue and fever.  HENT:  Positive for hearing loss and rhinorrhea (chronic). Negative for congestion.   Eyes:        Vision loss.   Respiratory:  Positive for cough (chronic improved). Negative for shortness of breath and wheezing.   Cardiovascular:  Negative for chest pain, palpitations and leg swelling.  Gastrointestinal:  Negative for abdominal distention, abdominal pain, constipation and diarrhea.  Genitourinary:  Negative for difficulty urinating and dysuria.  Musculoskeletal:  Positive for arthralgias, gait problem and joint swelling. Negative for back pain and myalgias.  Skin:  Positive for color change and wound (RLE).  Neurological:  Negative for dizziness, tremors, seizures, syncope, facial asymmetry, speech difficulty, weakness, light-headedness, numbness and headaches.  Psychiatric/Behavioral:  Negative for agitation, behavioral problems and confusion.     Health Maintenance  Topic Date Due   Medicare Annual Wellness (AWV)  03/19/2023   DTaP/Tdap/Td (2 - Td or  Tdap) 02/23/2029   Pneumonia Vaccine 69+ Years old  Completed   INFLUENZA VACCINE  Completed   COVID-19 Vaccine  Completed   Zoster Vaccines- Shingrix  Completed   HPV VACCINES  Aged Out   DEXA SCAN  Discontinued  Physical Exam: Vitals:   06/10/22 1511  BP: 124/64  Pulse: 73  Resp: 18  Temp: 97.6 F (36.4 C)  TempSrc: Temporal  SpO2: 97%  Weight: 132 lb 12.8 oz (60.2 kg)  Height: '5\' 2"'$  (1.575 m)   Body mass index is 24.29 kg/m. Physical Exam Vitals and nursing note reviewed.  Constitutional:      General: She is not in acute distress.    Appearance: She is not diaphoretic.  HENT:     Head: Normocephalic and atraumatic.  Neck:     Vascular: No JVD.  Cardiovascular:     Rate and Rhythm: Normal rate. Rhythm irregular.     Heart sounds: No murmur heard. Pulmonary:     Effort: Pulmonary effort is normal. No respiratory distress.     Breath sounds: Wheezing (mild exp both lobes.) present.     Comments: Decreased bases  Musculoskeletal:     Left knee: Swelling, ecchymosis and crepitus present. Normal range of motion. No tenderness.     Instability Tests: Anterior drawer test negative.     Right lower leg: Edema (+1) present.     Left lower leg: Edema (+2) present.     Comments: Large hematoma to posterior left knee  Skin:    General: Skin is warm and dry.     Findings: Bruising (left thigh down to left lower calf area. Not tender.) present.  Neurological:     Mental Status: She is alert and oriented to person, place, and time.     Labs reviewed: Basic Metabolic Panel: Recent Labs    06/28/21 0000 11/15/21 1531 11/29/21 0000 04/08/22 1045 04/09/22 0501 04/15/22 0000  NA 145  145 146   < > 139 136 143  K 4.0  4.0 3.8   < > 3.0* 3.6 3.6  CL 101  101 98*   < > 101 101 101  CO2 30*  30* 28*   < > 26 24 32*  GLUCOSE  --   --   --  142* 196*  --   BUN 25*  25* 49*   < > 39* 39* 29*  CREATININE 1.0  1.0 1.5*   < > 1.43* 1.39* 1.2*  CALCIUM 10.1  10.1  10.1   < > 8.9 8.8* 9.5  MG 2.6  2.6  --   --   --   --   --   TSH  --  2.57  --   --   --   --    < > = values in this interval not displayed.   Liver Function Tests: Recent Labs    11/15/21 1531 03/21/22 0000 04/08/22 1045  AST 35 34  34 22  ALT 41* 37*  37* 15  ALKPHOS 60 53  53 42  BILITOT  --   --  1.4*  PROT  --   --  6.2*  ALBUMIN 4.4 4.1  4.1 4.0   No results for input(s): "LIPASE", "AMYLASE" in the last 8760 hours. No results for input(s): "AMMONIA" in the last 8760 hours. CBC: Recent Labs    04/08/22 1045 04/09/22 0501 04/15/22 0000  WBC 12.8* 9.8 10.8  10.8  NEUTROABS 8.7*  --  8.00  HGB 12.0 11.5* 12.0  12.0  HCT 38.4 36.7 37  37  MCV 100.0 100.3*  --   PLT 223 217 330  330   Lipid Panel: No results for input(s): "CHOL", "HDL", "LDLCALC", "TRIG", "CHOLHDL", "LDLDIRECT" in the last 8760 hours. Lab  Results  Component Value Date   HGBA1C 5.7 (H) 06/25/2017    Procedures since last visit: CUP PACEART REMOTE DEVICE CHECK  Result Date: 06/06/2022 Scheduled remote reviewed. Normal device function.  Permanent AF, Xarelto Next remote 91 days. LA   Assessment/Plan  1. COPD with acute exacerbation ([place]) Improved Finishing prednisone taper Stay on Dulera, use albuterol prn as a rescue inhaler Change duonebs to as needed. If worsening can resume.   2. Swelling of left lower extremity Recommend venous doppler of left lower extremity ?hematoma or ruptured bakers cyst Nurse Nira Conn ordered through mobile company  3. Weight gain Take torsemide 20 mg daily for 3 days in addition to scheduled 30 mg   Labs/tests ordered:  * No order type specified * Next appt:  06/18/2022   Total time 25mn:  time greater than 50% of total time spent doing pt counseling and coordination of care

## 2022-06-12 ENCOUNTER — Other Ambulatory Visit: Payer: Self-pay | Admitting: Orthopedic Surgery

## 2022-06-12 DIAGNOSIS — I82412 Acute embolism and thrombosis of left femoral vein: Secondary | ICD-10-CM

## 2022-06-12 MED ORDER — RIVAROXABAN 15 MG PO TABS: 15.0000 mg | ORAL_TABLET | Freq: Two times a day (BID) | ORAL | 0 refills | Status: DC

## 2022-06-12 MED ORDER — RIVAROXABAN 20 MG PO TABS: 20.0000 mg | ORAL_TABLET | Freq: Every day | ORAL | 1 refills | Status: DC

## 2022-06-12 NOTE — Progress Notes (Signed)
Ultrasound LLE confirms acute nonocclusive DVT in left common femoral vein. She is on Xarelto 15 mg daily. Start Xarelto 15 mg po BID x 21 days, then 20 mg po daily. Dr. Lyndel Safe aware of results and agrees with plan of care.

## 2022-06-18 ENCOUNTER — Encounter: Payer: Medicare Other | Admitting: Internal Medicine

## 2022-06-18 DIAGNOSIS — M1712 Unilateral primary osteoarthritis, left knee: Secondary | ICD-10-CM | POA: Diagnosis not present

## 2022-06-21 NOTE — Progress Notes (Unsigned)
Subjective:    Patient ID: Jody Blake, female    DOB: 09-20-1925, 89 y.o.   MRN: [id number]  HPI F never smoker, followed for allergic rhinitis, chronic bronchitis, complicated by GERD, Hx PAfib/ pacemaker. ------------------------------------------------------------------------------------------------   01/07/22- 89 year old female never smoker followed for Allergic rhinitis, COPD mixed type, complicated by GERD,  AFib/pacemaker/ Xarelto, Glaucoma/ macular degen, dCHF, CHB/ pacemaker, AoStenosis, CKDIII, TIA      Living at [facility] -Singulair, Ventolin hfa, Symbicort 160, Neb Xopenex,  flonase, astelin, prednisone 5 mg daily, Pepcid,Gabapentin 100 qhs, Neb Xopenex,  Covid vax- 4 Moderna                    Caregiver here with her today Flu vax-today senior RSV vax- today Reports breathing is stable with no increase in DOE or cough. Asks about possibly trying different antihistamine for rhinitis/ drip. Discussed vaccines. CXR 11/30/21-  IMPRESSION: 1. No active cardiopulmonary disease. No evidence of pneumonia or pulmonary edema. 2. Stable cardiomegaly. 3. Aortic atherosclerosis.  06/24/22-  89 year old female never smoker followed for Allergic rhinitis, COPD mixed type, complicated by GERD,  AFib/pacemaker/ Xarelto, Glaucoma/ macular degen, dCHF, CHB/ pacemaker, AoStenosis, CKDIII, TIA      Living at [facility] -Singulair, Ventolin hfa, Symbicort 160, Neb Xopenex,  flonase, astelin, prednisone 5 mg daily, Pepcid,Gabapentin 100 qhs, Neb Xopenex,  Covid vax- 4 Moderna                    Caregiver here with her today Flu vax-today senior I just wanted to give you an update about [place]. This past winter [name] has had more exacerbations/infections and this has been treated with increased prednisone and doxycycline. She also just recovered from a bout of Covid. She has been taking either Dulera or Symbicort during this time depending on what insurance would cover. I see she has never smoked  and is labeled COPD mixed type. I wanted to just give you a heads up as she has an upcoming f/u apt with you next week that she is having recurrent infections and inquire if the inhaled steroids with chronically 5mg  of prednisone daily is still the recommended regimen for her. Thank you for your help and and I hope you and your wife are doing well!  Thanks  [employer] NP      ROS-see HPI   + = positive Constitutional:    weight loss, night sweats, fevers, chills, fatigue, lassitude. HEENT:    headaches, difficulty swallowing, tooth/dental problems, sore throat,       sneezing, itching, ear ache, nasal congestion, +post nasal drip, snoring CV:    chest pain, orthopnea, PND, swelling in lower extremities, anasarca,                                                     dizziness, palpitations Resp:   shortness of breath with exertion or at rest.                 productive cough,   +non-productive cough, coughing up of blood.              change in color of mucus.  +wheezing.   Skin:    rash or lesions. GI: +  heartburn, indigestion, abdominal pain, nausea, vomiting,  GU:  MS:   joint pain, stiffness,  Neuro-     nothing unusual Psych:  change in mood or affect.  depression or anxiety.   memory loss.  Objective:  OBJ- Physical Exam General- Alert, Oriented, Affect-appropriate, Distress- none acute, + slender Skin- rash-none, lesions- none, excoriation- none Lymphadenopathy- none Head- atraumatic            Eyes- Gross vision intact, PERRLA, conjunctivae and secretions clear            Ears- + Hard of hearing            Nose- Clear, no-Septal dev, mucus, polyps, erosion, perforation             Throat- Mallampati II , mucosa clear , drainage- none, tonsils- atrophic Neck- flexible , trachea midline, no stridor , thyroid nl, carotid no bruit Chest - symmetrical excursion , unlabored           Heart/CV- RRR , no murmur , no gallop  , no rub, nl s1 s2                           - JVD- none  , edema+ elastic hose, stasis changes- none, varices- none           Lung-  +few rhonchi, Wheeze -nonel, cough- , dullness -none, rub- none           Chest wall-+ left pacemaker.  Abd-   Br/ Gen/ Rectal- Not done, not indicated Extrem- +rolling walker Neuro- grossly intact to observation    Assessment & Plan:

## 2022-06-24 ENCOUNTER — Ambulatory Visit (INDEPENDENT_AMBULATORY_CARE_PROVIDER_SITE_OTHER): Payer: Medicare Other | Admitting: Internal Medicine

## 2022-06-24 ENCOUNTER — Ambulatory Visit (INDEPENDENT_AMBULATORY_CARE_PROVIDER_SITE_OTHER): Payer: Medicare Other

## 2022-06-24 ENCOUNTER — Encounter: Payer: Self-pay | Admitting: Internal Medicine

## 2022-06-24 VITALS — BP 122/84 | HR 71 | Ht 63.0 in | Wt 134.6 lb

## 2022-06-24 DIAGNOSIS — J449 Chronic obstructive pulmonary disease, unspecified: Secondary | ICD-10-CM | POA: Diagnosis not present

## 2022-06-24 DIAGNOSIS — I5033 Acute on chronic diastolic (congestive) heart failure: Secondary | ICD-10-CM

## 2022-06-24 DIAGNOSIS — J42 Unspecified chronic bronchitis: Secondary | ICD-10-CM | POA: Diagnosis not present

## 2022-06-24 DIAGNOSIS — R918 Other nonspecific abnormal finding of lung field: Secondary | ICD-10-CM | POA: Diagnosis not present

## 2022-06-24 MED ORDER — MOMETASONE FURO-FORMOTEROL FUM 100-5 MCG/ACT IN AERO: INHALATION_SPRAY | RESPIRATORY_TRACT | 12 refills | Status: DC

## 2022-06-24 NOTE — Patient Instructions (Addendum)
We are going to reduce the amount of steroid in your medicine some, to see if we can reduce how often you get respiratory infections.  [facility] script has been sent to reduce to [facility] 100     (was Dulera 200)    inhale 2 puffs, then rinse mouth, twice daily  Reduce your daily prednisone pill to 1/2 pill ( = 2.5 mg) once daily   You can use your neebulizer machine as you have been.   You can use your albuterol rescue inhaler- 2 puffs every 6 hours   IF NEEDED   Order- CXR     dx chronic bronchitis

## 2022-06-24 NOTE — Assessment & Plan Note (Signed)
Most recent CXR 04/08/2022 showed cardiomegaly and probable retrocardiac atelectasis Plan-CXR

## 2022-06-24 NOTE — Assessment & Plan Note (Signed)
Chronic bronchitis with frequent exacerbations over many years.  Concerned now as to whether steroid therapy is reducing resistance and increasing vulnerability towards infection.  Adrenal insufficiency is probable so we will be able to remove systemic steroids entirely. Plan-reduce Dulera from Dulera 200 to Dulera 100.  Reduce daily oral maintenance prednisone from 5 mg to 2.5 mg daily..  Continue nebulizer once daily since that seems to help her.  Continue albuterol rescue inhaler every 6 hours, emphasizing "as needed".

## 2022-06-25 DIAGNOSIS — M62552 Muscle wasting and atrophy, not elsewhere classified, left thigh: Secondary | ICD-10-CM | POA: Diagnosis not present

## 2022-06-25 DIAGNOSIS — M25662 Stiffness of left knee, not elsewhere classified: Secondary | ICD-10-CM | POA: Diagnosis not present

## 2022-06-25 DIAGNOSIS — M25562 Pain in left knee: Secondary | ICD-10-CM | POA: Diagnosis not present

## 2022-06-25 DIAGNOSIS — Z9181 History of falling: Secondary | ICD-10-CM | POA: Diagnosis not present

## 2022-06-25 DIAGNOSIS — M1712 Unilateral primary osteoarthritis, left knee: Secondary | ICD-10-CM | POA: Diagnosis not present

## 2022-06-26 DIAGNOSIS — Z9181 History of falling: Secondary | ICD-10-CM | POA: Diagnosis not present

## 2022-06-26 DIAGNOSIS — M62552 Muscle wasting and atrophy, not elsewhere classified, left thigh: Secondary | ICD-10-CM | POA: Diagnosis not present

## 2022-06-26 DIAGNOSIS — M1712 Unilateral primary osteoarthritis, left knee: Secondary | ICD-10-CM | POA: Diagnosis not present

## 2022-06-26 DIAGNOSIS — M25562 Pain in left knee: Secondary | ICD-10-CM | POA: Diagnosis not present

## 2022-06-26 DIAGNOSIS — M25662 Stiffness of left knee, not elsewhere classified: Secondary | ICD-10-CM | POA: Diagnosis not present

## 2022-06-27 IMAGING — DX DG CHEST 2V
2 series · 2 of 2 positions shown · non-contrast
Comparison: Chest x-ray 10/27/2019.

CLINICAL DATA: [AGE] female with history of left lower lobe
pneumonia.

EXAM:
CHEST - 2 VIEW

[chest pa]
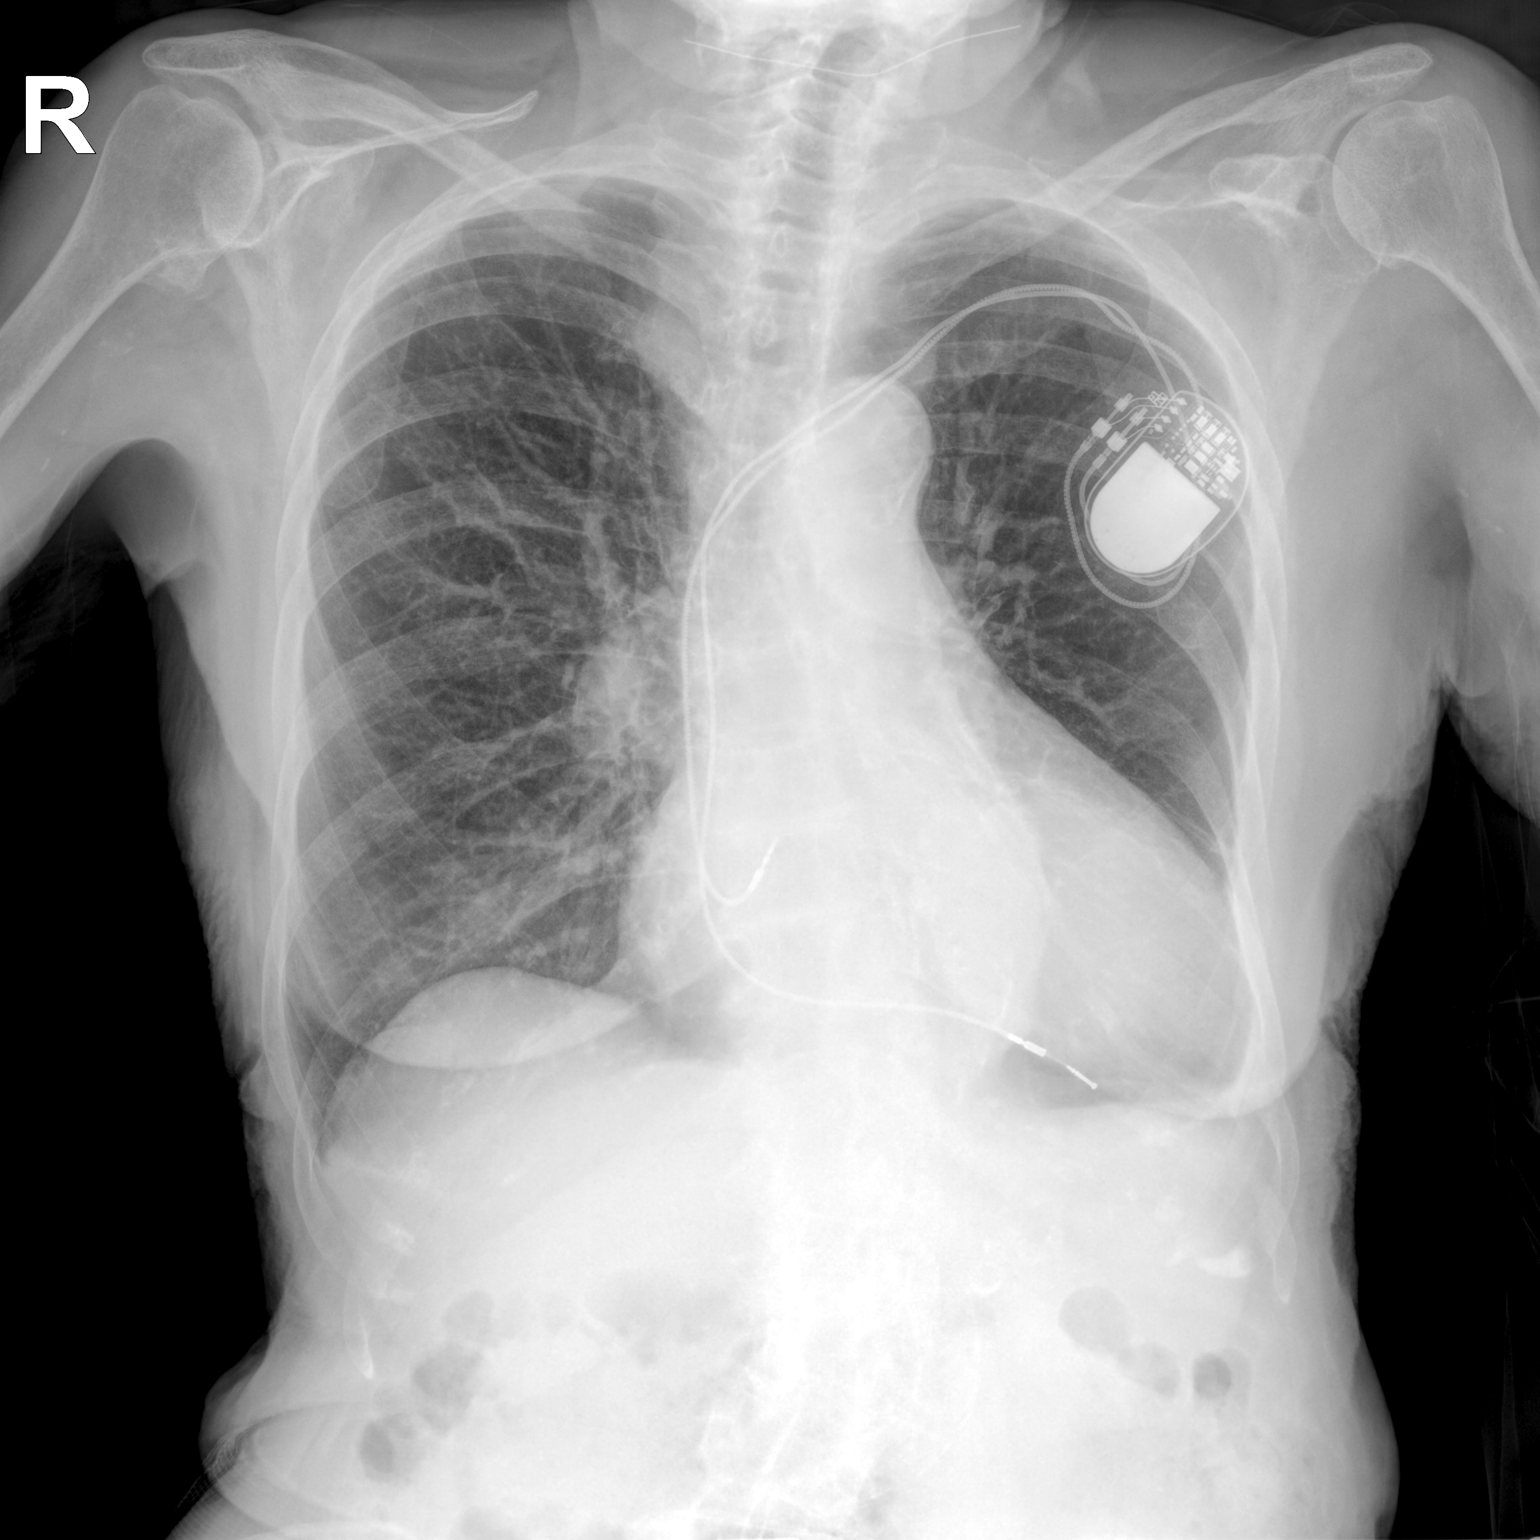

[chest lat]
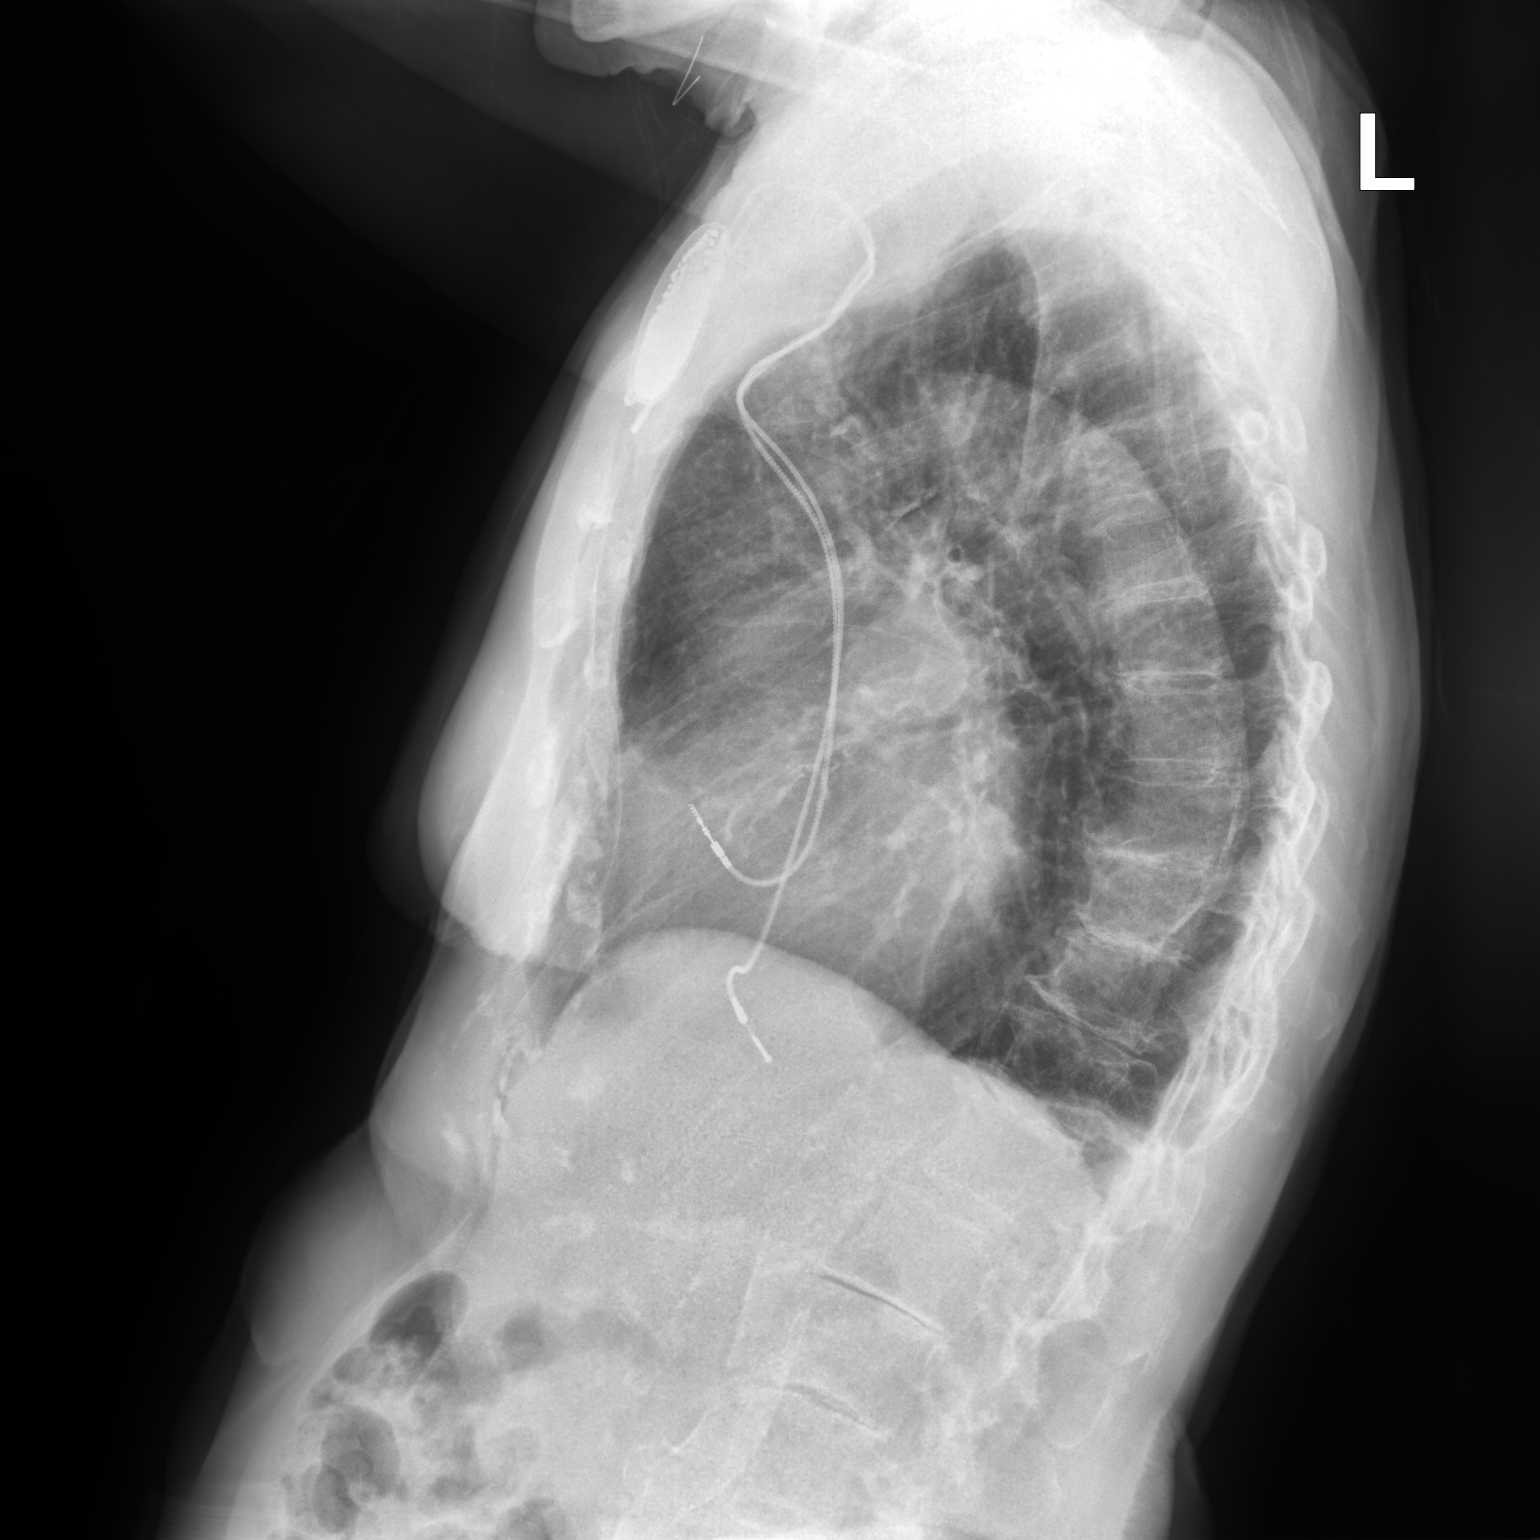

[2 of 2 positions shown; findings below may reference images not displayed]

FINDINGS: Lung volumes are normal. No consolidative airspace disease. No
pleural effusions. No pneumothorax. No pulmonary nodule or mass
noted. Pulmonary vasculature is normal. Mild cardiomegaly is similar
to the prior study. Upper mediastinal contours are within normal
limits. Aortic atherosclerosis. Left-sided pacemaker device with
lead tips projecting over the expected location of the right atrium
and right ventricle.
IMPRESSION: 1. Resolution of previously noted left lower lobe pneumonia.
2. Mild cardiomegaly.
3. Aortic atherosclerosis.

## 2022-07-01 ENCOUNTER — Ambulatory Visit: Payer: Medicare Other | Admitting: Internal Medicine

## 2022-07-01 ENCOUNTER — Other Ambulatory Visit: Payer: Self-pay

## 2022-07-01 DIAGNOSIS — M25662 Stiffness of left knee, not elsewhere classified: Secondary | ICD-10-CM | POA: Diagnosis not present

## 2022-07-01 DIAGNOSIS — M25562 Pain in left knee: Secondary | ICD-10-CM | POA: Diagnosis not present

## 2022-07-01 DIAGNOSIS — Z9181 History of falling: Secondary | ICD-10-CM | POA: Diagnosis not present

## 2022-07-01 DIAGNOSIS — M62552 Muscle wasting and atrophy, not elsewhere classified, left thigh: Secondary | ICD-10-CM | POA: Diagnosis not present

## 2022-07-01 DIAGNOSIS — R9389 Abnormal findings on diagnostic imaging of other specified body structures: Secondary | ICD-10-CM

## 2022-07-01 DIAGNOSIS — M1712 Unilateral primary osteoarthritis, left knee: Secondary | ICD-10-CM | POA: Diagnosis not present

## 2022-07-02 ENCOUNTER — Telehealth: Payer: Self-pay | Admitting: Internal Medicine

## 2022-07-02 DIAGNOSIS — M1712 Unilateral primary osteoarthritis, left knee: Secondary | ICD-10-CM | POA: Diagnosis not present

## 2022-07-02 NOTE — Telephone Encounter (Signed)
Please verify drug store, then send Rx prednisone 2.5 mg once daily, # 30, ref x 5.  Pharmacy may want to split 5 mg tabs.

## 2022-07-02 NOTE — Telephone Encounter (Signed)
ATC X1 LVM for patients caregiver Shirlean Mylar to give our office a call back. Please verify patients pharmacy so prednisone can be sent

## 2022-07-02 NOTE — Telephone Encounter (Signed)
Pt. Care giver calling back and said [place] is where the meds need to go

## 2022-07-03 ENCOUNTER — Other Ambulatory Visit: Payer: Self-pay

## 2022-07-03 ENCOUNTER — Telehealth: Payer: Self-pay | Admitting: Orthopedic Surgery

## 2022-07-03 ENCOUNTER — Emergency Department (HOSPITAL_BASED_OUTPATIENT_CLINIC_OR_DEPARTMENT_OTHER): Payer: Medicare Other | Admitting: Radiology

## 2022-07-03 ENCOUNTER — Emergency Department (HOSPITAL_BASED_OUTPATIENT_CLINIC_OR_DEPARTMENT_OTHER)
Admission: EM | Admit: 2022-07-03 | Discharge: 2022-07-03 | Disposition: A | Discharge status: Home / Self Care | Payer: Medicare Other | Attending: Emergency Medicine | Admitting: Emergency Medicine

## 2022-07-03 ENCOUNTER — Encounter (HOSPITAL_BASED_OUTPATIENT_CLINIC_OR_DEPARTMENT_OTHER): Payer: Self-pay

## 2022-07-03 DIAGNOSIS — S60212A Contusion of left wrist, initial encounter: Secondary | ICD-10-CM | POA: Insufficient documentation

## 2022-07-03 DIAGNOSIS — Z7901 Long term (current) use of anticoagulants: Secondary | ICD-10-CM | POA: Diagnosis not present

## 2022-07-03 DIAGNOSIS — M25532 Pain in left wrist: Secondary | ICD-10-CM | POA: Diagnosis not present

## 2022-07-03 DIAGNOSIS — W1839XA Other fall on same level, initial encounter: Secondary | ICD-10-CM | POA: Diagnosis not present

## 2022-07-03 DIAGNOSIS — Z950 Presence of cardiac pacemaker: Secondary | ICD-10-CM | POA: Insufficient documentation

## 2022-07-03 DIAGNOSIS — Z79899 Other long term (current) drug therapy: Secondary | ICD-10-CM | POA: Insufficient documentation

## 2022-07-03 DIAGNOSIS — S5002XA Contusion of left elbow, initial encounter: Secondary | ICD-10-CM | POA: Diagnosis not present

## 2022-07-03 DIAGNOSIS — E039 Hypothyroidism, unspecified: Secondary | ICD-10-CM | POA: Insufficient documentation

## 2022-07-03 DIAGNOSIS — S51012A Laceration without foreign body of left elbow, initial encounter: Secondary | ICD-10-CM | POA: Diagnosis not present

## 2022-07-03 DIAGNOSIS — Y92009 Unspecified place in unspecified non-institutional (private) residence as the place of occurrence of the external cause: Secondary | ICD-10-CM | POA: Diagnosis not present

## 2022-07-03 DIAGNOSIS — W19XXXA Unspecified fall, initial encounter: Secondary | ICD-10-CM | POA: Diagnosis not present

## 2022-07-03 DIAGNOSIS — I10 Essential (primary) hypertension: Secondary | ICD-10-CM | POA: Insufficient documentation

## 2022-07-03 DIAGNOSIS — T1490XA Injury, unspecified, initial encounter: Secondary | ICD-10-CM | POA: Diagnosis not present

## 2022-07-03 DIAGNOSIS — S59902A Unspecified injury of left elbow, initial encounter: Secondary | ICD-10-CM | POA: Diagnosis present

## 2022-07-03 MED ORDER — PREDNISONE 2.5 MG PO TABS: 2.5000 mg | ORAL_TABLET | Freq: Every day | ORAL | 5 refills | Status: DC

## 2022-07-03 NOTE — ED Provider Notes (Signed)
[facility] Provider Note   CSN: [id number] Arrival date & time: 07/03/22  1359     History {Add pertinent medical, surgical, social history, OB history to HPI:1} Chief Complaint  Patient presents with   Jody Blake Jody Blake is a 89 y.o. female.  Patient is a 89 year old female with a history of hypertension, paroxysmal atrial fibrillation, complete heart block status post pacemaker, hypothyroidism, DVT currently on Xarelto who is presenting today after a fall at home.  Patient reports she was getting something out of her refrigerator and she turned around and lost her balance and fell forward onto her left arm.  She did not hit her head or lose consciousness.  She reports remembering the entire event.  She did not feel dizzy or lightheaded prior to the fall but she reports she did not have enough strength to get off of the ground.  She is having no pain in her lower extremities but reports new pain in her left wrist and elbow.  The history is provided by the patient and the EMS personnel.  Fall       Home Medications Prior to Admission medications   Medication Sig Start Date End Date Taking? Authorizing Provider  albuterol (VENTOLIN HFA) 108 (90 Base) MCG/ACT inhaler Inhale 2 puffs into the lungs every 6 (six) hours as needed for wheezing or shortness of breath.    [provider]  Azelastine HCl 137 MCG/SPRAY SOLN USE 1 TO 2 SPRAYS INTO BOTH NOSTRILS TWICE DAILY. 09/24/21   Royal Hawthorn, NP  Calcium 600-10 MG-MCG CHEW Chew 1 capsule by mouth daily. 12/12/21   [name], MD  Cholecalciferol (VITAMIN D) 125 MCG (5000 UT) CAPS Take 1 capsule by mouth daily. 11/28/21   [name], MD  dorzolamide (TRUSOPT) 2 % ophthalmic solution Place 1 drop into both eyes 2 (two) times daily.    [provider]  famotidine (PEPCID) 40 MG tablet Take 1 tablet (40 mg total) by mouth at bedtime. 11/28/21   [name], MD   fexofenadine (ALLEGRA) 180 MG tablet Take 180 mg by mouth daily.    [provider]  fluticasone (FLONASE) 50 MCG/ACT nasal spray USE 2 SPRAYS EACH NOSTRIL ONCE A DAY AS NEEDED FOR ALLERGIES OR CONGESTION. 01/04/21   Deneise Lever, MD  gabapentin (NEURONTIN) 100 MG capsule Take 1 capsule (100 mg total) by mouth at bedtime. 07/18/21   [name], MD  ipratropium-albuterol (DUONEB) 0.5-2.5 (3) MG/3ML SOLN Take 3 mLs by nebulization 3 (three) times daily as needed.    [provider]  latanoprost (XALATAN) 0.005 % ophthalmic solution Place 1 drop into both eyes at bedtime.    [provider]  levothyroxine (SYNTHROID) 75 MCG tablet Take 1 tablet (75 mcg total) by mouth daily. 08/06/21   Royal Hawthorn, NP  losartan (COZAAR) 50 MG tablet Take 1 tablet (50 mg total) by mouth daily. 11/22/21   Royal Hawthorn, NP  Lutein 20 MG CAPS Take 1 capsule (20 mg total) by mouth daily. 07/23/21   [name], MD  Magnesium 500 MG TABS Take 750 mg by mouth See admin instructions. Take 750 mg by mouth every evening (in conjunction with 3 BioComplete capsules)    [provider]  metoprolol tartrate (LOPRESSOR) 50 MG tablet Take 1.5 tablets (75 mg total) by mouth in the morning. 07/24/21   [name], MD  mometasone-formoterol (DULERA) 100-5 MCG/ACT AERO Inhale 2  puffs then rinse mouth, twice daily 06/24/22   Baird Lyons D, MD  montelukast (SINGULAIR) 10 MG tablet Take 1 tablet (10 mg total) by mouth daily. 07/24/21   [name], MD  Multiple Vitamins-Minerals (PRESERVISION AREDS 2) CAPS Take 1 capsule by mouth in the morning and at bedtime. 07/24/21   [name], MD  NON FORMULARY Take 3 capsules by mouth See admin instructions. Gundry MD Bio Complete 3 - Prebiotic, Probiotic, Postbiotic to Support Optimal Gut Health capsules- TAKE 3 CAPSULES BY MOUTH EVERY EVENING (in conjunction with 750 mg Magnesium)    [provider]  potassium chloride (KLOR-CON)  10 MEQ tablet Take 2 tablets (20 mEq total) by mouth daily. 08/06/21   Royal Hawthorn, NP  predniSONE (DELTASONE) 2.5 MG tablet Take 1 tablet (2.5 mg total) by mouth daily with breakfast. 07/03/22   Deneise Lever, MD  predniSONE (DELTASONE) 5 MG tablet Take 5 mg by mouth daily with breakfast. 06/10/22   [provider]  rivaroxaban (XARELTO) 20 MG TABS tablet Take 1 tablet (20 mg total) by mouth daily with supper. 07/04/22   Fargo, Amy E, NP  sennosides-docusate sodium (SENOKOT-S) 8.6-50 MG tablet Take 2 tablets by mouth at bedtime.    [provider]  torsemide (DEMADEX) 20 MG tablet Take 1 tablet (20 mg total) by mouth daily as needed. 3 lb weight gain in 1 day or 5 lb in 1 week or excessive edema 05/21/22   Fargo, Amy E, NP  torsemide (DEMADEX) 20 MG tablet Take 1 tablet (20 mg total) by mouth daily for 3 days. 06/10/22 06/13/22  Royal Hawthorn, NP  TYLENOL 500 MG tablet Take 500 mg by mouth in the morning.    [provider]      Allergies    Combigan [brimonidine tartrate-timolol], Tape, Penicillins, Breo ellipta [fluticasone furoate-vilanterol], Fluticasone, Other, Timolol, and Vancomycin    Review of Systems   Review of Systems  Physical Exam Updated Vital Signs BP (!) 153/83 (BP Location: Right Arm)   Pulse 66   Temp 97.9 F (36.6 C) (Oral)   Resp 18   Ht 5' (1.524 m)   Wt 61 kg   LMP 04/08/1974   SpO2 100%   BMI 26.26 kg/m  Physical Exam Vitals and nursing note reviewed.  Constitutional:      General: She is not in acute distress.    Appearance: She is well-developed.  HENT:     Head: Normocephalic and atraumatic.  Eyes:     Pupils: Pupils are equal, round, and reactive to light.  Cardiovascular:     Rate and Rhythm: Normal rate and regular rhythm.     Heart sounds: Normal heart sounds. No murmur heard.    No friction rub.  Pulmonary:     Effort: Pulmonary effort is normal.     Breath sounds: Normal breath sounds. No wheezing or rales.   Abdominal:     General: Bowel sounds are normal. There is no distension.     Palpations: Abdomen is soft.     Tenderness: There is no abdominal tenderness. There is no guarding or rebound.  Musculoskeletal:        General: Tenderness present. Normal range of motion.     Right shoulder: Normal.     Left shoulder: Normal.     Left elbow: Normal range of motion. Tenderness present in olecranon process.     Left wrist: Tenderness and bony tenderness present. No swelling, deformity or snuff box tenderness.  Normal range of motion.       Arms:     Cervical back: Normal range of motion and neck supple.     Comments: No edema.  With palpation over the dorsal radial portion of the wrist.  No evidence of injury to bilateral lower extremities.  Full range of motion.  Patient does have skin changes consistent with chronic venous stasis in the left lower extremity but this appears chronic.  Skin:    General: Skin is warm and dry.     Findings: No rash.  Neurological:     Mental Status: She is alert and oriented to person, place, and time. Mental status is at baseline.     Cranial Nerves: No cranial nerve deficit.     Sensory: No sensory deficit.     Motor: No weakness.  Psychiatric:        Behavior: Behavior normal.     ED Results / Procedures / Treatments   Labs (all labs ordered are listed, but only abnormal results are displayed) Labs Reviewed - No data to display  EKG None  Radiology No results found.  Procedures Procedures  {Document cardiac monitor, telemetry assessment procedure when appropriate:1}  Medications Ordered in ED Medications - No data to display  ED Course/ Medical Decision Making/ A&P   {   Click here for ABCD2, HEART and other calculatorsREFRESH Note before signing :1}                          Medical Decision Making Amount and/or Complexity of Data Reviewed Radiology: ordered.   Pt with multiple medical problems and comorbidities and presenting today  with a complaint that caries a high risk for morbidity and mortality.  Elderly female here today after a fall at home.  Patient has pain to her left wrist and elbow.  Does not appear to have had any other injury.  This is a fall from ground-level.  Patient is taking Xarelto but reports she did not hit her head and remembers the entire event.  She has a GCS of 15 is awake and alert and is able to answer all questions appropriately.  She denies any dizziness or preceding symptoms prior to her fall.  She is otherwise neurologically intact and low suspicion for an acute cardiovascular process or stroke causing her fall.   {Document critical care time when appropriate:1} {Document review of labs and clinical decision tools ie heart score, Chads2Vasc2 etc:1}  {Document your independent review of radiology images, and any outside records:1} {Document your discussion with family members, caretakers, and with consultants:1} {Document social determinants of health affecting pt's care:1} {Document your decision making why or why not admission, treatments were needed:1} Final Clinical Impression(s) / ED Diagnoses Final diagnoses:  None    Rx / DC Orders ED Discharge Orders     None

## 2022-07-03 NOTE — ED Triage Notes (Signed)
States was in kitchen when fell.  States trip and fell onto left hand.  C/O left wrist

## 2022-07-03 NOTE — Telephone Encounter (Signed)
Script for prednisone has been sent to patients pharmacy. Caregiver Shirlean Mylar is aware. nfn

## 2022-07-03 NOTE — Discharge Instructions (Signed)
Nothing is broken today.  You will need to change the bandage on your elbow daily until it is healed.  You can take Tylenol as needed for aches and pains as you are getting be sore over the next few days.

## 2022-07-03 NOTE — ED Notes (Signed)
Patient ambulated in hallway with walker. Tolerated well.

## 2022-07-03 NOTE — Telephone Encounter (Signed)
[facility] nurse calls to report increased pain in hands due to fall. She was admitted to rehab unit today after ED visit. She was given tylenol 650 mg earlier this evening and ice pack with no relief. Advised to give Tylenol 500 mg po once and write SBAR for provider tomorrow.

## 2022-07-04 ENCOUNTER — Non-Acute Institutional Stay (SKILLED_NURSING_FACILITY): Payer: Medicare Other | Admitting: Adult Health

## 2022-07-04 ENCOUNTER — Encounter: Payer: Self-pay | Admitting: Adult Health

## 2022-07-04 DIAGNOSIS — I495 Sick sinus syndrome: Secondary | ICD-10-CM

## 2022-07-04 DIAGNOSIS — E039 Hypothyroidism, unspecified: Secondary | ICD-10-CM

## 2022-07-04 DIAGNOSIS — K219 Gastro-esophageal reflux disease without esophagitis: Secondary | ICD-10-CM | POA: Diagnosis not present

## 2022-07-04 DIAGNOSIS — R9389 Abnormal findings on diagnostic imaging of other specified body structures: Secondary | ICD-10-CM | POA: Diagnosis not present

## 2022-07-04 DIAGNOSIS — I5032 Chronic diastolic (congestive) heart failure: Secondary | ICD-10-CM | POA: Diagnosis not present

## 2022-07-04 DIAGNOSIS — M1712 Unilateral primary osteoarthritis, left knee: Secondary | ICD-10-CM

## 2022-07-04 DIAGNOSIS — M25662 Stiffness of left knee, not elsewhere classified: Secondary | ICD-10-CM | POA: Diagnosis not present

## 2022-07-04 DIAGNOSIS — I82412 Acute embolism and thrombosis of left femoral vein: Secondary | ICD-10-CM

## 2022-07-04 DIAGNOSIS — Z9181 History of falling: Secondary | ICD-10-CM | POA: Diagnosis not present

## 2022-07-04 DIAGNOSIS — M25562 Pain in left knee: Secondary | ICD-10-CM | POA: Diagnosis not present

## 2022-07-04 DIAGNOSIS — I10 Essential (primary) hypertension: Secondary | ICD-10-CM | POA: Diagnosis not present

## 2022-07-04 DIAGNOSIS — M62552 Muscle wasting and atrophy, not elsewhere classified, left thigh: Secondary | ICD-10-CM | POA: Diagnosis not present

## 2022-07-04 DIAGNOSIS — J449 Chronic obstructive pulmonary disease, unspecified: Secondary | ICD-10-CM

## 2022-07-04 DIAGNOSIS — S60212A Contusion of left wrist, initial encounter: Secondary | ICD-10-CM

## 2022-07-04 DIAGNOSIS — I4821 Permanent atrial fibrillation: Secondary | ICD-10-CM

## 2022-07-04 MED ORDER — TORSEMIDE 20 MG PO TABS: 30.0000 mg | ORAL_TABLET | Freq: Every day | ORAL | 0 refills | Status: DC

## 2022-07-04 MED ORDER — IPRATROPIUM-ALBUTEROL 0.5-2.5 (3) MG/3ML IN SOLN: 3.0000 mL | Freq: Every day | RESPIRATORY_TRACT | 0 refills | Status: DC

## 2022-07-04 MED ORDER — GABAPENTIN 100 MG PO CAPS: 100.0000 mg | ORAL_CAPSULE | Freq: Every day | ORAL | 0 refills | Status: DC | PRN

## 2022-07-04 NOTE — Progress Notes (Signed)
Location:  Occupational psychologist of Service:  SNF (31) Provider:   Cindi Carbon, [place] [phone]   [name], MD  Patient Care Team: [name], MD as PCP - General (Internal Medicine) Jerline Pain, MD as PCP - Cardiology (Cardiology) Thompson Grayer, MD (Inactive) as Consulting Physician (Cardiology) Deneise Lever, MD as Consulting Physician (Pulmonary Disease) Rutherford Guys, MD as Consulting Physician (Ophthalmology) [name], MD as Consulting Physician (Gastroenterology) Gaynelle Arabian, MD as Consulting Physician (Orthopedic Surgery)  Extended Emergency Contact Information Primary Emergency Contact: [name] of [place] Phone: [phone] Mobile Phone: [phone] Relation: Daughter Secondary Emergency Contact: [name] of [place] Phone: [phone] Mobile Phone: [phone] Relation: Son  Code Status:  DNR Goals of care: Advanced Directive information    06/10/2022    3:11 PM  Advanced Directives  Does Patient Have a Medical Advance Directive? Yes  Type of Paramedic of [place];Living will;Out of facility DNR (pink MOST or yellow form)  Does patient want to make changes to medical advance directive? No - Patient declined  Copy of [place] in Chart? Yes - validated most recent copy scanned in chart (See row information)  Pre-existing out of facility DNR order (yellow form or pink MOST form) Pink MOST form placed in chart (order not valid for inpatient use);Yellow form placed in chart (order not valid for inpatient use)     Chief Complaint  Patient presents with   Acute Visit    S/p fall ED visit     HPI:  Pt is a 89 y.o. female seen today for f/u after an ED visit due to a fall.   PMH significant for pacemaker due to SSS, afib, HTN, chronic diastolic CHF, COPD, chronic bronchitis, GERD, OA, CKD, HOH,  glaucoma, macular degeneration, blindness, constipation.  Reports she was in the kitchen and lost her balance. [place] and caught herself with her left wrist. She sustained a skin tear to the left elbow. She did not lose consciousness.   Xrays were taken of the left elbow and left wrist and were unremarkable.  She reports that her wrist is painful. A shooting nerve like pain in the wrist area and up the forearm. Had some relief with tylenol but wants more frequent dosing. Reports her left index finger and thumb are numb. She saw a specialist and they put her on neurontin at night Also sustained a skin tear to the left elbow.   Pt is HOH and low vision. Fall risk. Needs help with ADLs due to wrist pain. Has caretaker at home but they are not here 24/7 so she is in skilled rehab for increased care needs.   Past Medical History:  Diagnosis Date   Anemia    Aortic stenosis    a. Echo 09/06/12 EF 55-60%, no WMA, G2DD, Ao valve sclerosis w/ mod stenosis, LA mildly dilated, PA pressure 45mmHg   Asthmatic bronchitis    Complete heart block (HCC)    s/p permanent pacemaker 06/27/1999 (Battery change 06/2007 and 2016).  s/p AV nodal ablation by Dr [name] 2016.   COPD (chronic obstructive pulmonary disease) (HCC)    Dr. [name]   DDD (degenerative disc disease)    Diastolic dysfunction    a. Echo 09/06/12 EF 55-60%, no WMA, G2DD, Ao valve sclerosis w/ mod stenosis, LA mildly dilated, PA pressure 43mmHg   Diverticulosis    DVT (deep vein  thrombosis) in pregnancy 1954   a. LLE   Hypertension    Hypothyroidism    on medication   Macular degeneration    Dr. [name]   Osteoarthrosis, unspecified whether generalized or localized, other specified sites    PAF (paroxysmal atrial fibrillation) ([name])    Stopped flecainide, on amiodarone but still has bouts of A FIB (mostly in mornings).    Pure hypercholesterolemia    Staghorn calculus    Left   Past Surgical History:  Procedure Laterality Date   APPENDECTOMY   ~ 1941   AV NODE ABLATION  05/10/2014   AV NODE ABLATION N/A 05/10/2014   Procedure: AV NODE ABLATION;  Surgeon: Thompson Grayer, MD;  Location[facility] CATH LAB;  Service: Cardiovascular;  Laterality: N/A;   BUNIONECTOMY WITH HAMMERTOE RECONSTRUCTION Bilateral ~ [place]  06/27/99   Medtronic PM implanted by Dr [name]   CARDIOVERSION N/A 12/02/2013   Procedure: CARDIOVERSION;  Surgeon: [name], MD;  Location: [place];  Service: Cardiovascular;  Laterality: N/A;   CATARACT EXTRACTION W/ INTRAOCULAR LENS  IMPLANT, BILATERAL Bilateral    COLONOSCOPY WITH PROPOFOL N/A 04/22/2013   Procedure: COLONOSCOPY WITH PROPOFOL;  Surgeon: [name], MD;  Location: [facility] ENDOSCOPY;  Service: Endoscopy;  Laterality: N/A;   DILATION AND CURETTAGE OF UTERUS  X 2   "when I was going thru menopause"   ESOPHAGOGASTRODUODENOSCOPY (EGD) WITH PROPOFOL N/A 04/22/2013   Procedure: ESOPHAGOGASTRODUODENOSCOPY (EGD) WITH PROPOFOL;  Surgeon: [name], MD;  Location: [facility] ENDOSCOPY;  Service: Endoscopy;  Laterality: N/A;   HERNIA REPAIR     INCISIONAL HERNIA REPAIR     INSERT / REPLACE / REMOVE PACEMAKER  06/2007   "took out the old; put in new"   INSERT / REPLACE / REMOVE PACEMAKER  05/10/2014   MDT PPM generator change by Dr [name]   JOINT REPLACEMENT     PARTIAL NEPHRECTOMY Left 05/1974   stone disease   PERMANENT PACEMAKER GENERATOR CHANGE N/A 05/10/2014   Procedure: PERMANENT PACEMAKER GENERATOR CHANGE;  Surgeon: Thompson Grayer, MD;  Location: [facility] CATH LAB;  Service: Cardiovascular;  Laterality: N/A;   TONSILLECTOMY AND ADENOIDECTOMY  1930's   TOTAL KNEE ARTHROPLASTY Right 2001    Allergies  Allergen Reactions   Combigan [Brimonidine Tartrate-Timolol] Other (See Comments)    Systemic malaise amigen eye drop   Tape Other (See Comments)    SKIN IS VERY FRAGILE!!   Penicillins Hives   Breo Ellipta [Fluticasone Furoate-Vilanterol] Other (See Comments) and Hypertension    Increased  blood pressure   Fluticasone    Other     SEASONAL ALLERGIES   Timolol    Vancomycin Other (See Comments)    Red man syndrome - Mild redness and discomfort. Able to complete initial dose.     Outpatient Encounter Medications as of 07/04/2022  Medication Sig   gabapentin (NEURONTIN) 100 MG capsule Take 1 capsule (100 mg total) by mouth daily as needed.   torsemide (DEMADEX) 20 MG tablet Take 1.5 tablets (30 mg total) by mouth daily.   albuterol (VENTOLIN HFA) 108 (90 Base) MCG/ACT inhaler Inhale 2 puffs into the lungs every 6 (six) hours as needed for wheezing or shortness of breath.   Azelastine HCl 137 MCG/SPRAY SOLN USE 1 TO 2 SPRAYS INTO BOTH NOSTRILS TWICE DAILY.   Calcium 600-10 MG-MCG CHEW Chew 1 capsule by mouth daily.   Cholecalciferol (VITAMIN D) 125 MCG (5000 UT) CAPS Take 1 capsule by mouth daily.  dorzolamide (TRUSOPT) 2 % ophthalmic solution Place 1 drop into both eyes 2 (two) times daily.   famotidine (PEPCID) 40 MG tablet Take 1 tablet (40 mg total) by mouth at bedtime.   fexofenadine (ALLEGRA) 180 MG tablet Take 180 mg by mouth daily.   fluticasone (FLONASE) 50 MCG/ACT nasal spray USE 2 SPRAYS EACH NOSTRIL ONCE A DAY AS NEEDED FOR ALLERGIES OR CONGESTION.   gabapentin (NEURONTIN) 100 MG capsule Take 1 capsule (100 mg total) by mouth at bedtime.   ipratropium-albuterol (DUONEB) 0.5-2.5 (3) MG/3ML SOLN Take 3 mLs by nebulization 3 (three) times daily as needed.   latanoprost (XALATAN) 0.005 % ophthalmic solution Place 1 drop into both eyes at bedtime.   levothyroxine (SYNTHROID) 75 MCG tablet Take 1 tablet (75 mcg total) by mouth daily.   losartan (COZAAR) 50 MG tablet Take 1 tablet (50 mg total) by mouth daily.   Lutein 20 MG CAPS Take 1 capsule (20 mg total) by mouth daily.   Magnesium 500 MG TABS Take 750 mg by mouth See admin instructions. Take 750 mg by mouth every evening (in conjunction with 3 BioComplete capsules)   metoprolol tartrate (LOPRESSOR) 50 MG tablet Take  1.5 tablets (75 mg total) by mouth in the morning.   mometasone-formoterol (DULERA) 100-5 MCG/ACT AERO Inhale 2 puffs then rinse mouth, twice daily   montelukast (SINGULAIR) 10 MG tablet Take 1 tablet (10 mg total) by mouth daily.   Multiple Vitamins-Minerals (PRESERVISION AREDS 2) CAPS Take 1 capsule by mouth in the morning and at bedtime.   NON FORMULARY Take 3 capsules by mouth See admin instructions. Gundry MD Bio Complete 3 - Prebiotic, Probiotic, Postbiotic to Support Optimal Gut Health capsules- TAKE 3 CAPSULES BY MOUTH EVERY EVENING (in conjunction with 750 mg Magnesium)   potassium chloride (KLOR-CON) 10 MEQ tablet Take 2 tablets (20 mEq total) by mouth daily.   predniSONE (DELTASONE) 2.5 MG tablet Take 1 tablet (2.5 mg total) by mouth daily with breakfast.   rivaroxaban (XARELTO) 20 MG TABS tablet Take 1 tablet (20 mg total) by mouth daily with supper.   sennosides-docusate sodium (SENOKOT-S) 8.6-50 MG tablet Take 2 tablets by mouth at bedtime.   torsemide (DEMADEX) 20 MG tablet Take 1 tablet (20 mg total) by mouth daily as needed. 3 lb weight gain in 1 day or 5 lb in 1 week or excessive edema   TYLENOL 500 MG tablet Take 1,000 mg by mouth in the morning, at noon, and at bedtime.   [DISCONTINUED] predniSONE (DELTASONE) 5 MG tablet Take 5 mg by mouth daily with breakfast.   [DISCONTINUED] torsemide (DEMADEX) 20 MG tablet Take 1 tablet (20 mg total) by mouth daily for 3 days.   No facility-administered encounter medications on file as of 07/04/2022.    Review of Systems  Constitutional:  Positive for activity change. Negative for appetite change, chills, diaphoresis, fatigue, fever and unexpected weight change.  HENT:  Positive for hearing loss and rhinorrhea. Negative for congestion.   Respiratory:  Positive for cough (chronic). Negative for shortness of breath and wheezing.   Cardiovascular:  Positive for leg swelling. Negative for chest pain and palpitations.  Gastrointestinal:   Negative for abdominal distention, abdominal pain, constipation and diarrhea.  Genitourinary:  Negative for difficulty urinating and dysuria.  Musculoskeletal:  Positive for arthralgias, gait problem and joint swelling. Negative for back pain and myalgias.  Neurological:  Positive for numbness. Negative for dizziness, tremors, seizures, syncope, facial asymmetry, speech difficulty, weakness, light-headedness and headaches.  Psychiatric/Behavioral:  Negative for agitation, behavioral problems and confusion.     Immunization History  Administered Date(s) Administered   COVID-19, mRNA, vaccine(Comirnaty)12 years and older 01/28/2022   Influenza Split 01/07/2011   Influenza Whole 01/16/2009, 01/23/2010   Influenza, High Dose Seasonal PF 01/06/2013, 01/15/2019, 02/04/2020   Influenza,inj,Quad PF,6+ Mos 02/17/2015, 01/30/2018, 01/07/2022   Influenza,inj,quad, With Preservative 01/06/2017   Influenza-Unspecified 01/06/2014, 02/01/2016, 02/04/2020, 01/26/2021   Moderna Covid-19 Vaccine Bivalent Booster 64yrs & up 08/09/2021   Moderna SARS-COV2 Booster Vaccination 08/18/2020   Moderna Sars-Covid-2 Vaccination 04/20/2019, 05/18/2019, 02/22/2020   Pneumococcal Conjugate-13 02/24/2019   Pneumococcal Polysaccharide-23 11/23/2013   Respiratory Syncytial Virus Vaccine,Recomb Aduvanted(Arexvy) 01/07/2022   Tdap 02/24/2019   Zoster Recombinat (Shingrix) 03/13/2017, 05/20/2017   Pertinent  Health Maintenance Due  Topic Date Due   INFLUENZA VACCINE  Completed   DEXA SCAN  Discontinued      04/10/2022    9:00 PM 04/11/2022   10:00 AM 04/23/2022   11:44 AM 05/28/2022    1:42 PM 06/10/2022    3:10 PM  Fall Risk  Falls in the past year?   0 0 1  Was there an injury with Fall?   0 0 0  Fall Risk Category Calculator   0 0 1  (RETIRED) Patient Fall Risk Level High fall risk High fall risk     Patient at Risk for Falls Due to   Impaired balance/gait;History of fall(s) Impaired balance/gait;Impaired mobility  Impaired mobility;Impaired balance/gait  Fall risk Follow up   Falls evaluation completed Falls evaluation completed Falls evaluation completed   Functional Status Survey:    Vitals:   07/04/22 0852  BP: (!) 99/54  Pulse: 69  Resp: 20  Temp: 98.3 F (36.8 C)  SpO2: 98%  Weight: 131 lb 6.4 oz (59.6 kg)   Body mass index is 25.66 kg/m. Physical Exam Vitals and nursing note reviewed.  Constitutional:      General: She is not in acute distress.    Appearance: She is not diaphoretic.  HENT:     Head: Normocephalic and atraumatic.  Neck:     Vascular: No JVD.  Cardiovascular:     Rate and Rhythm: Normal rate and regular rhythm.     Heart sounds: No murmur heard. Pulmonary:     Effort: Pulmonary effort is normal. No respiratory distress.     Breath sounds: Normal breath sounds. No wheezing.  Musculoskeletal:        General: Swelling (Left wrist with eccymosis) present.     Right wrist: Normal.     Left wrist: Swelling, tenderness and bony tenderness present. No snuff box tenderness or crepitus. Decreased range of motion. Normal pulse.     Comments: BLE edema +1  Skin:    General: Skin is warm and dry.  Neurological:     Mental Status: She is alert and oriented to person, place, and time.     Labs reviewed: Recent Labs    04/08/22 1045 04/09/22 0501 04/15/22 0000  NA 139 136 143  K 3.0* 3.6 3.6  CL 101 101 101  CO2 26 24 32*  GLUCOSE 142* 196*  --   BUN 39* 39* 29*  CREATININE 1.43* 1.39* 1.2*  CALCIUM 8.9 8.8* 9.5   Recent Labs    11/15/21 1531 03/21/22 0000 04/08/22 1045  AST 35 34  34 22  ALT 41* 37*  37* 15  ALKPHOS 60 53  53 42  BILITOT  --   --  1.4*  PROT  --   --  6.2*  ALBUMIN 4.4 4.1  4.1 4.0   Recent Labs    04/08/22 1045 04/09/22 0501 04/15/22 0000  WBC 12.8* 9.8 10.8  10.8  NEUTROABS 8.7*  --  8.00  HGB 12.0 11.5* 12.0  12.0  HCT 38.4 36.7 37  37  MCV 100.0 100.3*  --   PLT 223 217 330  330   Lab Results  Component  Value Date   TSH 2.57 11/15/2021   Lab Results  Component Value Date   HGBA1C 5.7 (H) 06/25/2017   Lab Results  Component Value Date   CHOL 138 11/04/2017   HDL 70 11/04/2017   LDLCALC 58 11/04/2017   TRIG 53 11/04/2017   CHOLHDL 3.1 06/25/2017    Significant Diagnostic Results in last 30 days:  DG Elbow Complete Left  Result Date: 07/03/2022 CLINICAL DATA:  Trauma, fall EXAM: LEFT ELBOW - COMPLETE 3+ VIEW COMPARISON:  None Available. FINDINGS: No displaced fracture or dislocation is seen. There is soft tissue deformity along the dorsal aspect of the proximal ulna. There are few smooth marginated calcifications adjacent to the bony structures, possibly ligament calcifications from previous injury. There is no displacement of posterior fat pad. IMPRESSION: No recent fracture or dislocation is seen in left elbow. Electronically Signed   By: Elmer Picker M.D.   On: 07/03/2022 14:32   DG Wrist Complete Left  Result Date: 07/03/2022 CLINICAL DATA:  Trauma, fall, pain EXAM: LEFT WRIST - COMPLETE 3+ VIEW COMPARISON:  None Available. FINDINGS: No displaced fracture or dislocation is seen. Osteopenia is seen in bony structures. Marked degenerative changes are noted in first carpometacarpal joint. There is subluxation in first carpometacarpal joint. Calcifications are noted adjacent to distal ulna and carpals, possibly ligament or cartilage calcifications due to degenerative arthritis. IMPRESSION: No recent displaced fracture or dislocation is seen. Other findings as described in the body of the report. Electronically Signed   By: Elmer Picker M.D.   On: 07/03/2022 14:30    Assessment/Plan 1. Contusion of left wrist, initial encounter Add neurontin 100 mg qd prn for "nerve" pain Add tylenol 1 gram tid scheduled x 5 days ICE TID x 3 days Keep elevated If not improving would refer to ortho and re xray, still quite swollen with reduce movement.   2. Permanent atrial fibrillation  ([place]) Followed by Cardiology  On metoprolol On xarelto for CVA risk reduction   3. Sick sinus syndrome [facility]) S/p pacemaker   4. Essential hypertension Controlled ON cozaar   5. Chronic diastolic congestive heart failure (HCC) Euvolemic right now Torsemide 30 mg daily   6. COPD mixed type ([place]) Followed by Dr [name]  Currently on [facility] and duonebs each night Had recurrent infections so prednisone and Dulera were decreased at his last visit   7. Gastroesophageal reflux disease, unspecified whether esophagitis present Continue pepcid qhs   8. Acquired hypothyroidism Lab Results  Component Value Date   TSH 2.57 11/15/2021   Continue synthroid   9. Primary osteoarthritis of left knee Followed by ortho, receiving injections   10. Abnormal CXR Round opacity in infrahilar region on f/u xray 06/24/22 Dr [name] ordered a CT scan 07/07/22 to further eval  11. Acute deep vein thrombosis (DVT) of femoral vein of left lower extremity (HCC) Found on 06/12/22 Took Xarelto 15 mg bid for 21 days, now 20 mg daily     Family/ staff Communication: resident and nurse   Labs/tests ordered:  CBC BMP 07/09/22

## 2022-07-05 ENCOUNTER — Telehealth: Payer: Self-pay | Admitting: Internal Medicine

## 2022-07-05 DIAGNOSIS — M25562 Pain in left knee: Secondary | ICD-10-CM | POA: Diagnosis not present

## 2022-07-05 DIAGNOSIS — Z9181 History of falling: Secondary | ICD-10-CM | POA: Diagnosis not present

## 2022-07-05 DIAGNOSIS — M25662 Stiffness of left knee, not elsewhere classified: Secondary | ICD-10-CM | POA: Diagnosis not present

## 2022-07-05 DIAGNOSIS — M1712 Unilateral primary osteoarthritis, left knee: Secondary | ICD-10-CM | POA: Diagnosis not present

## 2022-07-05 DIAGNOSIS — M62552 Muscle wasting and atrophy, not elsewhere classified, left thigh: Secondary | ICD-10-CM | POA: Diagnosis not present

## 2022-07-05 NOTE — Telephone Encounter (Signed)
Jody Blake,  I called caregiver Shirlean Mylar back and she states she missed a call from you. But I looked under all of the tabs and I did not see any messages or updates. Do you remember why you called ?  Thank you

## 2022-07-05 NOTE — Progress Notes (Signed)
Remote pacemaker transmission.   

## 2022-07-05 NOTE — Telephone Encounter (Signed)
Pt. Caregiver calling back was told to call our office back

## 2022-07-05 NOTE — Telephone Encounter (Signed)
Completed   Closing encounter

## 2022-07-07 ENCOUNTER — Ambulatory Visit (HOSPITAL_BASED_OUTPATIENT_CLINIC_OR_DEPARTMENT_OTHER)
Admission: RE | Admit: 2022-07-07 | Discharge: 2022-07-07 | Disposition: A | Discharge status: Home / Self Care | Payer: Medicare Other | Source: Ambulatory Visit | Attending: Internal Medicine | Admitting: Internal Medicine

## 2022-07-07 DIAGNOSIS — J90 Pleural effusion, not elsewhere classified: Secondary | ICD-10-CM | POA: Diagnosis not present

## 2022-07-07 DIAGNOSIS — R9389 Abnormal findings on diagnostic imaging of other specified body structures: Secondary | ICD-10-CM | POA: Insufficient documentation

## 2022-07-08 ENCOUNTER — Encounter: Payer: Self-pay | Admitting: Internal Medicine

## 2022-07-08 ENCOUNTER — Non-Acute Institutional Stay (SKILLED_NURSING_FACILITY): Payer: Medicare Other | Admitting: Internal Medicine

## 2022-07-08 DIAGNOSIS — I5032 Chronic diastolic (congestive) heart failure: Secondary | ICD-10-CM | POA: Diagnosis not present

## 2022-07-08 DIAGNOSIS — M25562 Pain in left knee: Secondary | ICD-10-CM | POA: Diagnosis not present

## 2022-07-08 DIAGNOSIS — I4821 Permanent atrial fibrillation: Secondary | ICD-10-CM

## 2022-07-08 DIAGNOSIS — I82412 Acute embolism and thrombosis of left femoral vein: Secondary | ICD-10-CM | POA: Diagnosis not present

## 2022-07-08 DIAGNOSIS — Z9181 History of falling: Secondary | ICD-10-CM | POA: Diagnosis not present

## 2022-07-08 DIAGNOSIS — M25662 Stiffness of left knee, not elsewhere classified: Secondary | ICD-10-CM | POA: Diagnosis not present

## 2022-07-08 DIAGNOSIS — I10 Essential (primary) hypertension: Secondary | ICD-10-CM | POA: Diagnosis not present

## 2022-07-08 DIAGNOSIS — E039 Hypothyroidism, unspecified: Secondary | ICD-10-CM | POA: Diagnosis not present

## 2022-07-08 DIAGNOSIS — G458 Other transient cerebral ischemic attacks and related syndromes: Secondary | ICD-10-CM | POA: Diagnosis not present

## 2022-07-08 DIAGNOSIS — J418 Mixed simple and mucopurulent chronic bronchitis: Secondary | ICD-10-CM | POA: Diagnosis not present

## 2022-07-08 DIAGNOSIS — M25532 Pain in left wrist: Secondary | ICD-10-CM | POA: Diagnosis not present

## 2022-07-08 DIAGNOSIS — R1312 Dysphagia, oropharyngeal phase: Secondary | ICD-10-CM | POA: Diagnosis not present

## 2022-07-08 DIAGNOSIS — J449 Chronic obstructive pulmonary disease, unspecified: Secondary | ICD-10-CM | POA: Diagnosis not present

## 2022-07-08 DIAGNOSIS — M62552 Muscle wasting and atrophy, not elsewhere classified, left thigh: Secondary | ICD-10-CM | POA: Diagnosis not present

## 2022-07-08 DIAGNOSIS — M6281 Muscle weakness (generalized): Secondary | ICD-10-CM | POA: Diagnosis not present

## 2022-07-08 DIAGNOSIS — S60212A Contusion of left wrist, initial encounter: Secondary | ICD-10-CM | POA: Insufficient documentation

## 2022-07-08 DIAGNOSIS — I495 Sick sinus syndrome: Secondary | ICD-10-CM

## 2022-07-08 DIAGNOSIS — R278 Other lack of coordination: Secondary | ICD-10-CM | POA: Diagnosis not present

## 2022-07-08 DIAGNOSIS — S638X2A Sprain of other part of left wrist and hand, initial encounter: Secondary | ICD-10-CM | POA: Diagnosis not present

## 2022-07-08 DIAGNOSIS — M1712 Unilateral primary osteoarthritis, left knee: Secondary | ICD-10-CM

## 2022-07-08 DIAGNOSIS — K219 Gastro-esophageal reflux disease without esophagitis: Secondary | ICD-10-CM

## 2022-07-08 DIAGNOSIS — S60212S Contusion of left wrist, sequela: Secondary | ICD-10-CM | POA: Diagnosis not present

## 2022-07-08 NOTE — Progress Notes (Signed)
Provider:   Location:  [facility] Room Number: 152A Place of Service:  SNF (31)  PCP: Mahlon Gammon, MD Patient Care Team: Mahlon Gammon, MD as PCP - General (Internal Medicine) Jake Bathe, MD as PCP - Cardiology (Cardiology) Hillis Range, MD (Inactive) as Consulting Physician (Cardiology) Waymon Budge, MD as Consulting Physician (Pulmonary Disease) Jethro Bolus, MD as Consulting Physician (Ophthalmology) Charolett Bumpers, MD as Consulting Physician (Gastroenterology) Ollen Gross, MD as Consulting Physician (Orthopedic Surgery)  Extended Emergency Contact Information Primary Emergency Contact: Corlis Leak of [place] Home Phone: [phone] Mobile Phone: [phone] Relation: Daughter Secondary Emergency Contact: Abram Sander of [place] Home Phone: [phone] Mobile Phone: [phone] Relation: Son  Code Status: DNR Goals of Care: Advanced Directive information    07/08/2022   10:43 AM  Advanced Directives  Does Patient Have a Medical Advance Directive? Yes  Type of Estate agent of [place];Living will;Out of facility DNR (pink MOST or yellow form)  Does patient want to make changes to medical advance directive? No - Patient declined  Copy of Healthcare Power of Attorney in Chart? Yes - validated most recent copy scanned in chart (See row information)  Pre-existing out of facility DNR order (yellow form or pink MOST form) Pink MOST form placed in chart (order not valid for inpatient use);Yellow form placed in chart (order not valid for inpatient use)      Chief Complaint  Patient presents with   New Admit To SNF    Patient is being seen for new Admission to SNF    HPI: Patient is a 89 y.o. female seen today for admission to Rehab  Lives in [place] with her caregiver  Patient has h/o A. fib on Xarelto s/p AV nodal ablation, s/p PPM in 2009 Aortic stenosis   COPD with frequent exacerbations.  Not on oxygen.  On chronic prednisone.  Follows closely with Dr. Maple Hudson.  Is enrolled in palliative care Chronic diastolic CHF  History of chronic venous insufficiency Vision loss due to macular degeneration and glaucoma Very HOH Osteopenia   Pneumonia in 01/24 Diagnosed with Covid on 05/20/22 treated with Molnupiravir  Acute DVT in 06/12/2022 when she came with LLE swelling Now on Higher dose of Xarelto  Abnormal Xray CT scan of Lung Shows no Nodules  Larey Seat in her Kitchen when she lost her balance Xrays in ED were negative Unable to perform her ADLS so admitted in SNF  Feels Pain and Numbness in Right hand No Other complains today Denies Dizziness No SOB or cough Does have appointment with Ortho   Past Medical History:  Diagnosis Date   Anemia    Aortic stenosis    a. Echo 09/06/12 EF 55-60%, no WMA, G2DD, Ao valve sclerosis w/ mod stenosis, LA mildly dilated, PA pressure   Asthmatic bronchitis    Complete heart block    s/p permanent pacemaker 06/27/1999 (Battery change 06/2007 and 2016).  s/p AV nodal ablation by Dr Johney Frame 2016.   COPD (chronic obstructive pulmonary disease)    Dr. Maple Hudson   DDD (degenerative disc disease)    Diastolic dysfunction    a. Echo 09/06/12 EF 55-60%, no WMA, G2DD, Ao valve sclerosis w/ mod stenosis, LA mildly dilated, PA pressure   Diverticulosis    DVT (deep vein thrombosis) in pregnancy 1954   a. LLE   Hypertension    Hypothyroidism    on medication   Macular degeneration  Dr. Jesusita Oka   Osteoarthrosis, unspecified whether generalized or localized, other specified sites    PAF (paroxysmal atrial fibrillation)    Stopped flecainide, on amiodarone but still has bouts of A FIB (mostly in mornings).    Pure hypercholesterolemia    Staghorn calculus    Left   Past Surgical History:  Procedure Laterality Date   APPENDECTOMY  ~ 1941   AV NODE ABLATION  05/10/2014   AV NODE ABLATION N/A 05/10/2014    Procedure: AV NODE ABLATION;  Surgeon: Hillis Range, MD;  Location: [facility] CATH LAB;  Service: Cardiovascular;  Laterality: N/A;   BUNIONECTOMY WITH HAMMERTOE RECONSTRUCTION Bilateral ~ 1990   CARDIAC PACEMAKER PLACEMENT  06/27/99   Medtronic PM implanted by Dr Amil Amen   CARDIOVERSION N/A 12/02/2013   Procedure: CARDIOVERSION;  Surgeon: Pricilla Riffle, MD;  Location: [facility] ENDOSCOPY;  Service: Cardiovascular;  Laterality: N/A;   CATARACT EXTRACTION W/ INTRAOCULAR LENS  IMPLANT, BILATERAL Bilateral    COLONOSCOPY WITH PROPOFOL N/A 04/22/2013   Procedure: COLONOSCOPY WITH PROPOFOL;  Surgeon: Charolett Bumpers, MD;  Location: [facility] ENDOSCOPY;  Service: Endoscopy;  Laterality: N/A;   DILATION AND CURETTAGE OF UTERUS  X 2   "when I was going thru menopause"   ESOPHAGOGASTRODUODENOSCOPY (EGD) WITH PROPOFOL N/A 04/22/2013   Procedure: ESOPHAGOGASTRODUODENOSCOPY (EGD) WITH PROPOFOL;  Surgeon: Charolett Bumpers, MD;  Location: [facility] ENDOSCOPY;  Service: Endoscopy;  Laterality: N/A;   HERNIA REPAIR     INCISIONAL HERNIA REPAIR     INSERT / REPLACE / REMOVE PACEMAKER  06/2007   "took out the old; put in new"   INSERT / REPLACE / REMOVE PACEMAKER  05/10/2014   MDT PPM generator change by Dr Johney Frame   JOINT REPLACEMENT     PARTIAL NEPHRECTOMY Left 05/1974   stone disease   PERMANENT PACEMAKER GENERATOR CHANGE N/A 05/10/2014   Procedure: PERMANENT PACEMAKER GENERATOR CHANGE;  Surgeon: Hillis Range, MD;  Location: [facility] CATH LAB;  Service: Cardiovascular;  Laterality: N/A;   TONSILLECTOMY AND ADENOIDECTOMY  1930's   TOTAL KNEE ARTHROPLASTY Right 2001    reports that she has never smoked. She has never used smokeless tobacco. She reports current alcohol use of about 7.0 standard drinks of alcohol per week. She reports that she does not use drugs. Social History   Socioeconomic History   Marital status: Widowed    Spouse name: Not on file   Number of children: 2   Years of education: Not on file   Highest education level: Not  on file  Occupational History   Occupation: RETIRED    Employer: RETIRED    Comment: Family [employer] business  Tobacco Use   Smoking status: Never   Smokeless tobacco: Never  Vaping Use   Vaping Use: Never used  Substance and Sexual Activity   Alcohol use: Yes    Alcohol/week: 7.0 standard drinks of alcohol    Types: 7 Glasses of wine per week    Comment: occasionally   Drug use: No   Sexual activity: Not Currently    Partners: Male  Other Topics Concern   Not on file  Social History Narrative   Diet? Low salt, low fat      Do you drink/eat things with caffeine? yes      Marital status?                 married                   What year  were you married? 1949      Do you live in a house, apartment, assisted living, condo, trailer, etc.? apartment      Is it one or more stories? 3 stories      How many persons live in your home? Just me      Do you have any pets in your home? (please list) no      Current or past profession: accounting      Do you exercise?          yes                            Type & how often? Walk, class, prescribed daily      Do you have a living will? yes      Do you have a DNR form?     yes                             If not, do you want to discuss one?      Do you have signed POA/HPOA for forms? no         Social Determinants of Health   Financial Resource Strain: Low Risk  (02/16/2021)   Overall Financial Resource Strain (CARDIA)    Difficulty of Paying Living Expenses: Not hard at all  Food Insecurity: No Food Insecurity (04/08/2022)   Hunger Vital Sign    Worried About Running Out of Food in the Last Year: Never true    Ran Out of Food in the Last Year: Never true  Transportation Needs: No Transportation Needs (04/08/2022)   PRAPARE - Administrator, Civil Service (Medical): No    Lack of Transportation (Non-Medical): No  Physical Activity: Sufficiently Active (02/16/2021)   Exercise Vital Sign    Days of Exercise per Week:  7 days    Minutes of Exercise per Session: 30 min  Stress: No Stress Concern Present (02/16/2021)   [employer] of Occupational Health - Occupational Stress Questionnaire    Feeling of Stress : Not at all  Social Connections: Moderately Isolated (02/16/2021)   Social Connection and Isolation Panel [NHANES]    Frequency of Communication with Friends and Family: More than three times a week    Frequency of Social Gatherings with Friends and Family: More than three times a week    Attends Religious Services: Never    Database administrator or Organizations: Yes    Attends Engineer, structural: More than 4 times per year    Marital Status: Widowed  Intimate Partner Violence: Not At Risk (04/08/2022)   Humiliation, Afraid, Rape, and Kick questionnaire    Fear of Current or Ex-Partner: No    Emotionally Abused: No    Physically Abused: No    Sexually Abused: No    Functional Status Survey:    Family History  Problem Relation Age of Onset   Malignant hypertension Father    Hypertension Father    Renal Disease Father    Breast cancer Mother    Heart attack Brother    Stroke Brother     Health Maintenance  Topic Date Due   INFLUENZA VACCINE  11/07/2022   Medicare Annual Wellness (AWV)  03/19/2023   DTaP/Tdap/Td (2 - Td or Tdap) 02/23/2029   Pneumonia Vaccine 43+ Years old  Completed   COVID-19 Vaccine  Completed  Zoster Vaccines- Shingrix  Completed   HPV VACCINES  Aged Out   DEXA SCAN  Discontinued    Allergies  Allergen Reactions   Combigan [Brimonidine Tartrate-Timolol] Other (See Comments)    Systemic malaise amigen eye drop   Tape Other (See Comments)    SKIN IS VERY FRAGILE!!   Penicillins Hives   Breo Ellipta [Fluticasone Furoate-Vilanterol] Other (See Comments) and Hypertension    Increased blood pressure   Fluticasone    Other     SEASONAL ALLERGIES   Timolol    Vancomycin Other (See Comments)    Red man syndrome - Mild redness and discomfort.  Able to complete initial dose.     Outpatient Encounter Medications as of 07/08/2022  Medication Sig   Azelastine HCl 137 MCG/SPRAY SOLN USE 1 TO 2 SPRAYS INTO BOTH NOSTRILS TWICE DAILY.   Calcium 600-10 MG-MCG CHEW Chew 1 capsule by mouth daily.   dorzolamide (TRUSOPT) 2 % ophthalmic solution Place 1 drop into both eyes 2 (two) times daily.   famotidine (PEPCID) 40 MG tablet Take 1 tablet (40 mg total) by mouth at bedtime.   fexofenadine (ALLEGRA) 180 MG tablet Take 180 mg by mouth daily.   fluticasone (FLONASE) 50 MCG/ACT nasal spray USE 2 SPRAYS EACH NOSTRIL ONCE A DAY AS NEEDED FOR ALLERGIES OR CONGESTION.   gabapentin (NEURONTIN) 100 MG capsule Take 1 capsule (100 mg total) by mouth at bedtime.   gabapentin (NEURONTIN) 100 MG capsule Take 1 capsule (100 mg total) by mouth daily as needed.   ipratropium-albuterol (DUONEB) 0.5-2.5 (3) MG/3ML SOLN Take 3 mLs by nebulization 3 (three) times daily as needed.   ipratropium-albuterol (DUONEB) 0.5-2.5 (3) MG/3ML SOLN Take 3 mLs by nebulization at bedtime.   latanoprost (XALATAN) 0.005 % ophthalmic solution Place 1 drop into both eyes at bedtime.   levothyroxine (SYNTHROID) 75 MCG tablet Take 1 tablet (75 mcg total) by mouth daily.   losartan (COZAAR) 50 MG tablet Take 1 tablet (50 mg total) by mouth daily.   Lutein 20 MG CAPS Take 1 capsule (20 mg total) by mouth daily.   Magnesium 500 MG TABS Take 750 mg by mouth See admin instructions. Take 750 mg by mouth every evening (in conjunction with 3 BioComplete capsules)   metoprolol tartrate (LOPRESSOR) 50 MG tablet Take 1.5 tablets (75 mg total) by mouth in the morning.   mometasone-formoterol (DULERA) 100-5 MCG/ACT AERO Inhale 2 puffs then rinse mouth, twice daily   montelukast (SINGULAIR) 10 MG tablet Take 1 tablet (10 mg total) by mouth daily.   Multiple Vitamins-Minerals (PRESERVISION AREDS 2) CAPS Take 1 capsule by mouth in the morning and at bedtime.   NON FORMULARY Take 3 capsules by mouth See  admin instructions. Gundry MD Bio Complete 3 - Prebiotic, Probiotic, Postbiotic to Support Optimal Gut Health capsules- TAKE 3 CAPSULES BY MOUTH EVERY EVENING (in conjunction with 750 mg Magnesium)   potassium chloride (KLOR-CON) 10 MEQ tablet Take 2 tablets (20 mEq total) by mouth daily.   predniSONE (DELTASONE) 2.5 MG tablet Take 1 tablet (2.5 mg total) by mouth daily with breakfast.   rivaroxaban (XARELTO) 20 MG TABS tablet Take 1 tablet (20 mg total) by mouth daily with supper.   sennosides-docusate sodium (SENOKOT-S) 8.6-50 MG tablet Take 2 tablets by mouth at bedtime.   torsemide (DEMADEX) 20 MG tablet Take 1 tablet (20 mg total) by mouth daily as needed. 3 lb weight gain in 1 day or 5 lb in 1 week or excessive edema  torsemide (DEMADEX) 20 MG tablet Take 1.5 tablets (30 mg total) by mouth daily.   albuterol (VENTOLIN HFA) 108 (90 Base) MCG/ACT inhaler Inhale 2 puffs into the lungs every 6 (six) hours as needed for wheezing or shortness of breath.   Cholecalciferol (VITAMIN D) 125 MCG (5000 UT) CAPS Take 1 capsule by mouth daily.   TYLENOL 500 MG tablet Take 1,000 mg by mouth in the morning, at noon, and at bedtime. (Patient not taking: Reported on 07/08/2022)   No facility-administered encounter medications on file as of 07/08/2022.    Review of Systems  Constitutional:  Negative for activity change and appetite change.  HENT: Negative.    Eyes:  Positive for visual disturbance.  Respiratory:  Positive for shortness of breath. Negative for cough.   Cardiovascular:  Negative for leg swelling.  Gastrointestinal:  Negative for constipation.  Genitourinary: Negative.   Musculoskeletal:  Positive for gait problem. Negative for arthralgias and myalgias.  Skin: Negative.   Neurological:  Negative for dizziness and weakness.  Psychiatric/Behavioral:  Negative for confusion, dysphoric mood and sleep disturbance.     Vitals:   07/08/22 1038  BP: 124/67  Pulse: 66  Resp: 16  Temp: (!) 97  F (36.1 C)  TempSrc: Temporal  SpO2: 96%  Weight: 131 lb 6.4 oz (59.6 kg)  Height: 5' (1.524 m)   Body mass index is 25.66 kg/m. Physical Exam Vitals reviewed.  Constitutional:      Appearance: Normal appearance.  HENT:     Head: Normocephalic.     Nose: Nose normal.     Mouth/Throat:     Mouth: Mucous membranes are moist.     Pharynx: Oropharynx is clear.  Eyes:     Pupils: Pupils are equal, round, and reactive to light.  Cardiovascular:     Rate and Rhythm: Normal rate and regular rhythm.     Pulses: Normal pulses.     Heart sounds: Normal heart sounds. No murmur heard. Pulmonary:     Effort: Pulmonary effort is normal. No respiratory distress.     Breath sounds: Normal breath sounds. No wheezing.  Abdominal:     General: Abdomen is flat. Bowel sounds are normal.     Palpations: Abdomen is soft.  Musculoskeletal:        General: Swelling present.     Cervical back: Neck supple.     Comments: Left Wrist Swollen and Tender Able to move her fingers and close her Wrist  Skin:    General: Skin is warm.  Neurological:     General: No focal deficit present.     Mental Status: She is alert and oriented to person, place, and time.  Psychiatric:        Mood and Affect: Mood normal.        Thought Content: Thought content normal.     Labs reviewed: Basic Metabolic Panel: Recent Labs    04/08/22 1045 04/09/22 0501 04/15/22 0000  NA 139 136 143  K 3.0* 3.6 3.6  CL 101 101 101  CO2 26 24 32*  GLUCOSE 142* 196*  --   BUN 39* 39* 29*  CREATININE 1.43* 1.39* 1.2*  CALCIUM 8.9 8.8* 9.5   Liver Function Tests: Recent Labs    11/15/21 1531 03/21/22 0000 04/08/22 1045  AST 35 34  34 22  ALT 41* 37*  37* 15  ALKPHOS 60 53  53 42  BILITOT  --   --  1.4*  PROT  --   --  6.2*  ALBUMIN 4.4 4.1  4.1 4.0   No results for input(s): "LIPASE", "AMYLASE" in the last 8760 hours. No results for input(s): "AMMONIA" in the last 8760 hours. CBC: Recent Labs     04/08/22 1045 04/09/22 0501 04/15/22 0000  WBC 12.8* 9.8 10.8  10.8  NEUTROABS 8.7*  --  8.00  HGB 12.0 11.5* 12.0  12.0  HCT 38.4 36.7 37  37  MCV 100.0 100.3*  --   PLT 223 217 330  330   Cardiac Enzymes: No results for input(s): "CKTOTAL", "CKMB", "CKMBINDEX", "TROPONINI" in the last 8760 hours. BNP: Invalid input(s): "POCBNP" Lab Results  Component Value Date   HGBA1C 5.7 (H) 06/25/2017   Lab Results  Component Value Date   TSH 2.57 11/15/2021   Lab Results  Component Value Date   VITAMINB12 450 03/21/2022   VITAMINB12 450 03/21/2022   Lab Results  Component Value Date   FOLATE 18.2 04/13/2021   Lab Results  Component Value Date   IRON 36 04/13/2021   TIBC 196 (L) 04/13/2021   FERRITIN 269 04/13/2021    Imaging and Procedures obtained prior to SNF admission: No results found.  Assessment/Plan 1. Contusion of left wrist, On Tylenol Has Appointment with Ortho Ice and Elevation is helping with swelling 2. Permanent atrial fibrillation On Metoprolol Xarelto  3. Acute deep vein thrombosis (DVT) of femoral vein of left lower extremity Possible Post Covid On Higher dose of Xarelto  4. Sick sinus syndrome Pacemaker  5. Essential hypertension On COzar  6. Chronic diastolic congestive heart failure On Torsemide low Dose  7. COPD mixed type On Dulera and Low dose Prednisone Follows with Dr Maple Hudson  8. Gastroesophageal reflux disease, unspecified whether esophagitis present Pepcid  9. Acquired hypothyroidism TSH normal in 08/23  10. Primary osteoarthritis of left knee Injected by Ortho  Family/ staff Communication:   Labs/tests ordered:

## 2022-07-09 DIAGNOSIS — Z9181 History of falling: Secondary | ICD-10-CM | POA: Diagnosis not present

## 2022-07-09 DIAGNOSIS — G458 Other transient cerebral ischemic attacks and related syndromes: Secondary | ICD-10-CM | POA: Diagnosis not present

## 2022-07-09 DIAGNOSIS — R531 Weakness: Secondary | ICD-10-CM | POA: Diagnosis not present

## 2022-07-09 DIAGNOSIS — M1712 Unilateral primary osteoarthritis, left knee: Secondary | ICD-10-CM | POA: Diagnosis not present

## 2022-07-09 DIAGNOSIS — S638X2A Sprain of other part of left wrist and hand, initial encounter: Secondary | ICD-10-CM | POA: Diagnosis not present

## 2022-07-09 DIAGNOSIS — R278 Other lack of coordination: Secondary | ICD-10-CM | POA: Diagnosis not present

## 2022-07-09 DIAGNOSIS — J418 Mixed simple and mucopurulent chronic bronchitis: Secondary | ICD-10-CM | POA: Diagnosis not present

## 2022-07-09 LAB — BASIC METABOLIC PANEL
BUN: 38 — AB (ref 4–21)
CO2: 27 — AB (ref 13–22)
Chloride: 102 (ref 99–108)
Creatinine: 1.2 — AB (ref 0.5–1.1)
Glucose: 86
Potassium: 3.9 mEq/L (ref 3.5–5.1)
Sodium: 141 (ref 137–147)

## 2022-07-09 LAB — CBC: RBC: 3.64 — AB (ref 3.87–5.11)

## 2022-07-09 LAB — CBC AND DIFFERENTIAL
HCT: 35 — AB (ref 36–46)
Hemoglobin: 11.4 — AB (ref 12.0–16.0)
Platelets: 294 10*3/uL (ref 150–400)
WBC: 8.2

## 2022-07-09 LAB — HEPATIC FUNCTION PANEL
ALT: 11 U/L (ref 7–35)
AST: 20 (ref 13–35)
Alkaline Phosphatase: 67 (ref 25–125)
Bilirubin, Total: 0.5

## 2022-07-09 LAB — COMPREHENSIVE METABOLIC PANEL
Albumin: 3.6 (ref 3.5–5.0)
Calcium: 9.5 (ref 8.7–10.7)
Globulin: 2.3
eGFR: 42

## 2022-07-10 DIAGNOSIS — S638X2A Sprain of other part of left wrist and hand, initial encounter: Secondary | ICD-10-CM | POA: Diagnosis not present

## 2022-07-10 DIAGNOSIS — G458 Other transient cerebral ischemic attacks and related syndromes: Secondary | ICD-10-CM | POA: Diagnosis not present

## 2022-07-10 DIAGNOSIS — Z9181 History of falling: Secondary | ICD-10-CM | POA: Diagnosis not present

## 2022-07-10 DIAGNOSIS — J418 Mixed simple and mucopurulent chronic bronchitis: Secondary | ICD-10-CM | POA: Diagnosis not present

## 2022-07-10 DIAGNOSIS — R278 Other lack of coordination: Secondary | ICD-10-CM | POA: Diagnosis not present

## 2022-07-10 DIAGNOSIS — M1712 Unilateral primary osteoarthritis, left knee: Secondary | ICD-10-CM | POA: Diagnosis not present

## 2022-07-11 DIAGNOSIS — R278 Other lack of coordination: Secondary | ICD-10-CM | POA: Diagnosis not present

## 2022-07-11 DIAGNOSIS — Z9181 History of falling: Secondary | ICD-10-CM | POA: Diagnosis not present

## 2022-07-11 DIAGNOSIS — G458 Other transient cerebral ischemic attacks and related syndromes: Secondary | ICD-10-CM | POA: Diagnosis not present

## 2022-07-11 DIAGNOSIS — J418 Mixed simple and mucopurulent chronic bronchitis: Secondary | ICD-10-CM | POA: Diagnosis not present

## 2022-07-11 DIAGNOSIS — S638X2A Sprain of other part of left wrist and hand, initial encounter: Secondary | ICD-10-CM | POA: Diagnosis not present

## 2022-07-11 DIAGNOSIS — M1712 Unilateral primary osteoarthritis, left knee: Secondary | ICD-10-CM | POA: Diagnosis not present

## 2022-07-12 DIAGNOSIS — S638X2A Sprain of other part of left wrist and hand, initial encounter: Secondary | ICD-10-CM | POA: Diagnosis not present

## 2022-07-12 DIAGNOSIS — G458 Other transient cerebral ischemic attacks and related syndromes: Secondary | ICD-10-CM | POA: Diagnosis not present

## 2022-07-12 DIAGNOSIS — Z9181 History of falling: Secondary | ICD-10-CM | POA: Diagnosis not present

## 2022-07-12 DIAGNOSIS — R278 Other lack of coordination: Secondary | ICD-10-CM | POA: Diagnosis not present

## 2022-07-12 DIAGNOSIS — M1712 Unilateral primary osteoarthritis, left knee: Secondary | ICD-10-CM | POA: Diagnosis not present

## 2022-07-12 DIAGNOSIS — J418 Mixed simple and mucopurulent chronic bronchitis: Secondary | ICD-10-CM | POA: Diagnosis not present

## 2022-07-15 ENCOUNTER — Ambulatory Visit: Payer: Medicare Other | Admitting: Internal Medicine

## 2022-07-15 DIAGNOSIS — J418 Mixed simple and mucopurulent chronic bronchitis: Secondary | ICD-10-CM | POA: Diagnosis not present

## 2022-07-15 DIAGNOSIS — M1712 Unilateral primary osteoarthritis, left knee: Secondary | ICD-10-CM | POA: Diagnosis not present

## 2022-07-15 DIAGNOSIS — R278 Other lack of coordination: Secondary | ICD-10-CM | POA: Diagnosis not present

## 2022-07-15 DIAGNOSIS — G458 Other transient cerebral ischemic attacks and related syndromes: Secondary | ICD-10-CM | POA: Diagnosis not present

## 2022-07-15 DIAGNOSIS — S638X2A Sprain of other part of left wrist and hand, initial encounter: Secondary | ICD-10-CM | POA: Diagnosis not present

## 2022-07-15 DIAGNOSIS — Z9181 History of falling: Secondary | ICD-10-CM | POA: Diagnosis not present

## 2022-07-16 DIAGNOSIS — Z9181 History of falling: Secondary | ICD-10-CM | POA: Diagnosis not present

## 2022-07-16 DIAGNOSIS — R278 Other lack of coordination: Secondary | ICD-10-CM | POA: Diagnosis not present

## 2022-07-16 DIAGNOSIS — J418 Mixed simple and mucopurulent chronic bronchitis: Secondary | ICD-10-CM | POA: Diagnosis not present

## 2022-07-16 DIAGNOSIS — S638X2A Sprain of other part of left wrist and hand, initial encounter: Secondary | ICD-10-CM | POA: Diagnosis not present

## 2022-07-16 DIAGNOSIS — G458 Other transient cerebral ischemic attacks and related syndromes: Secondary | ICD-10-CM | POA: Diagnosis not present

## 2022-07-16 DIAGNOSIS — M1712 Unilateral primary osteoarthritis, left knee: Secondary | ICD-10-CM | POA: Diagnosis not present

## 2022-07-17 DIAGNOSIS — S638X2A Sprain of other part of left wrist and hand, initial encounter: Secondary | ICD-10-CM | POA: Diagnosis not present

## 2022-07-17 DIAGNOSIS — M1712 Unilateral primary osteoarthritis, left knee: Secondary | ICD-10-CM | POA: Diagnosis not present

## 2022-07-17 DIAGNOSIS — G458 Other transient cerebral ischemic attacks and related syndromes: Secondary | ICD-10-CM | POA: Diagnosis not present

## 2022-07-17 DIAGNOSIS — R278 Other lack of coordination: Secondary | ICD-10-CM | POA: Diagnosis not present

## 2022-07-17 DIAGNOSIS — Z9181 History of falling: Secondary | ICD-10-CM | POA: Diagnosis not present

## 2022-07-17 DIAGNOSIS — J418 Mixed simple and mucopurulent chronic bronchitis: Secondary | ICD-10-CM | POA: Diagnosis not present

## 2022-07-18 DIAGNOSIS — R278 Other lack of coordination: Secondary | ICD-10-CM | POA: Diagnosis not present

## 2022-07-18 DIAGNOSIS — J418 Mixed simple and mucopurulent chronic bronchitis: Secondary | ICD-10-CM | POA: Diagnosis not present

## 2022-07-18 DIAGNOSIS — G458 Other transient cerebral ischemic attacks and related syndromes: Secondary | ICD-10-CM | POA: Diagnosis not present

## 2022-07-18 DIAGNOSIS — Z9181 History of falling: Secondary | ICD-10-CM | POA: Diagnosis not present

## 2022-07-18 DIAGNOSIS — S638X2A Sprain of other part of left wrist and hand, initial encounter: Secondary | ICD-10-CM | POA: Diagnosis not present

## 2022-07-18 DIAGNOSIS — M1712 Unilateral primary osteoarthritis, left knee: Secondary | ICD-10-CM | POA: Diagnosis not present

## 2022-07-19 DIAGNOSIS — J418 Mixed simple and mucopurulent chronic bronchitis: Secondary | ICD-10-CM | POA: Diagnosis not present

## 2022-07-19 DIAGNOSIS — G458 Other transient cerebral ischemic attacks and related syndromes: Secondary | ICD-10-CM | POA: Diagnosis not present

## 2022-07-19 DIAGNOSIS — R278 Other lack of coordination: Secondary | ICD-10-CM | POA: Diagnosis not present

## 2022-07-19 DIAGNOSIS — M1712 Unilateral primary osteoarthritis, left knee: Secondary | ICD-10-CM | POA: Diagnosis not present

## 2022-07-19 DIAGNOSIS — Z9181 History of falling: Secondary | ICD-10-CM | POA: Diagnosis not present

## 2022-07-19 DIAGNOSIS — S638X2A Sprain of other part of left wrist and hand, initial encounter: Secondary | ICD-10-CM | POA: Diagnosis not present

## 2022-07-22 DIAGNOSIS — M1712 Unilateral primary osteoarthritis, left knee: Secondary | ICD-10-CM | POA: Diagnosis not present

## 2022-07-22 DIAGNOSIS — G458 Other transient cerebral ischemic attacks and related syndromes: Secondary | ICD-10-CM | POA: Diagnosis not present

## 2022-07-22 DIAGNOSIS — Z9181 History of falling: Secondary | ICD-10-CM | POA: Diagnosis not present

## 2022-07-22 DIAGNOSIS — S638X2A Sprain of other part of left wrist and hand, initial encounter: Secondary | ICD-10-CM | POA: Diagnosis not present

## 2022-07-22 DIAGNOSIS — J418 Mixed simple and mucopurulent chronic bronchitis: Secondary | ICD-10-CM | POA: Diagnosis not present

## 2022-07-22 DIAGNOSIS — R278 Other lack of coordination: Secondary | ICD-10-CM | POA: Diagnosis not present

## 2022-07-23 ENCOUNTER — Encounter: Payer: Medicare Other | Admitting: Internal Medicine

## 2022-07-23 DIAGNOSIS — G458 Other transient cerebral ischemic attacks and related syndromes: Secondary | ICD-10-CM | POA: Diagnosis not present

## 2022-07-23 DIAGNOSIS — Z9181 History of falling: Secondary | ICD-10-CM | POA: Diagnosis not present

## 2022-07-23 DIAGNOSIS — J418 Mixed simple and mucopurulent chronic bronchitis: Secondary | ICD-10-CM | POA: Diagnosis not present

## 2022-07-23 DIAGNOSIS — M1712 Unilateral primary osteoarthritis, left knee: Secondary | ICD-10-CM | POA: Diagnosis not present

## 2022-07-23 DIAGNOSIS — R278 Other lack of coordination: Secondary | ICD-10-CM | POA: Diagnosis not present

## 2022-07-23 DIAGNOSIS — S638X2A Sprain of other part of left wrist and hand, initial encounter: Secondary | ICD-10-CM | POA: Diagnosis not present

## 2022-07-24 DIAGNOSIS — S638X2A Sprain of other part of left wrist and hand, initial encounter: Secondary | ICD-10-CM | POA: Diagnosis not present

## 2022-07-24 DIAGNOSIS — R278 Other lack of coordination: Secondary | ICD-10-CM | POA: Diagnosis not present

## 2022-07-24 DIAGNOSIS — G458 Other transient cerebral ischemic attacks and related syndromes: Secondary | ICD-10-CM | POA: Diagnosis not present

## 2022-07-24 DIAGNOSIS — M1712 Unilateral primary osteoarthritis, left knee: Secondary | ICD-10-CM | POA: Diagnosis not present

## 2022-07-24 DIAGNOSIS — Z9181 History of falling: Secondary | ICD-10-CM | POA: Diagnosis not present

## 2022-07-24 DIAGNOSIS — J418 Mixed simple and mucopurulent chronic bronchitis: Secondary | ICD-10-CM | POA: Diagnosis not present

## 2022-07-24 NOTE — Progress Notes (Signed)
A user error has taken place.

## 2022-07-25 ENCOUNTER — Other Ambulatory Visit (HOSPITAL_BASED_OUTPATIENT_CLINIC_OR_DEPARTMENT_OTHER): Payer: Medicare Other

## 2022-07-25 DIAGNOSIS — S638X2A Sprain of other part of left wrist and hand, initial encounter: Secondary | ICD-10-CM | POA: Diagnosis not present

## 2022-07-25 DIAGNOSIS — G458 Other transient cerebral ischemic attacks and related syndromes: Secondary | ICD-10-CM | POA: Diagnosis not present

## 2022-07-25 DIAGNOSIS — J418 Mixed simple and mucopurulent chronic bronchitis: Secondary | ICD-10-CM | POA: Diagnosis not present

## 2022-07-25 DIAGNOSIS — Z9181 History of falling: Secondary | ICD-10-CM | POA: Diagnosis not present

## 2022-07-25 DIAGNOSIS — M1712 Unilateral primary osteoarthritis, left knee: Secondary | ICD-10-CM | POA: Diagnosis not present

## 2022-07-25 DIAGNOSIS — R278 Other lack of coordination: Secondary | ICD-10-CM | POA: Diagnosis not present

## 2022-07-26 DIAGNOSIS — R278 Other lack of coordination: Secondary | ICD-10-CM | POA: Diagnosis not present

## 2022-07-26 DIAGNOSIS — G458 Other transient cerebral ischemic attacks and related syndromes: Secondary | ICD-10-CM | POA: Diagnosis not present

## 2022-07-26 DIAGNOSIS — Z9181 History of falling: Secondary | ICD-10-CM | POA: Diagnosis not present

## 2022-07-26 DIAGNOSIS — M1712 Unilateral primary osteoarthritis, left knee: Secondary | ICD-10-CM | POA: Diagnosis not present

## 2022-07-26 DIAGNOSIS — J418 Mixed simple and mucopurulent chronic bronchitis: Secondary | ICD-10-CM | POA: Diagnosis not present

## 2022-07-26 DIAGNOSIS — S638X2A Sprain of other part of left wrist and hand, initial encounter: Secondary | ICD-10-CM | POA: Diagnosis not present

## 2022-07-29 ENCOUNTER — Encounter: Payer: Self-pay | Admitting: Adult Health

## 2022-07-29 ENCOUNTER — Non-Acute Institutional Stay (SKILLED_NURSING_FACILITY): Payer: Medicare Other | Admitting: Adult Health

## 2022-07-29 DIAGNOSIS — I82402 Acute embolism and thrombosis of unspecified deep veins of left lower extremity: Secondary | ICD-10-CM | POA: Diagnosis not present

## 2022-07-29 DIAGNOSIS — R278 Other lack of coordination: Secondary | ICD-10-CM | POA: Diagnosis not present

## 2022-07-29 DIAGNOSIS — J449 Chronic obstructive pulmonary disease, unspecified: Secondary | ICD-10-CM

## 2022-07-29 DIAGNOSIS — I4821 Permanent atrial fibrillation: Secondary | ICD-10-CM | POA: Diagnosis not present

## 2022-07-29 DIAGNOSIS — I10 Essential (primary) hypertension: Secondary | ICD-10-CM | POA: Diagnosis not present

## 2022-07-29 DIAGNOSIS — I5032 Chronic diastolic (congestive) heart failure: Secondary | ICD-10-CM

## 2022-07-29 DIAGNOSIS — H401131 Primary open-angle glaucoma, bilateral, mild stage: Secondary | ICD-10-CM

## 2022-07-29 DIAGNOSIS — I495 Sick sinus syndrome: Secondary | ICD-10-CM

## 2022-07-29 DIAGNOSIS — Z9181 History of falling: Secondary | ICD-10-CM | POA: Diagnosis not present

## 2022-07-29 DIAGNOSIS — M1712 Unilateral primary osteoarthritis, left knee: Secondary | ICD-10-CM | POA: Diagnosis not present

## 2022-07-29 DIAGNOSIS — S60212S Contusion of left wrist, sequela: Secondary | ICD-10-CM | POA: Diagnosis not present

## 2022-07-29 DIAGNOSIS — G458 Other transient cerebral ischemic attacks and related syndromes: Secondary | ICD-10-CM | POA: Diagnosis not present

## 2022-07-29 DIAGNOSIS — S638X2A Sprain of other part of left wrist and hand, initial encounter: Secondary | ICD-10-CM | POA: Diagnosis not present

## 2022-07-29 DIAGNOSIS — J418 Mixed simple and mucopurulent chronic bronchitis: Secondary | ICD-10-CM | POA: Diagnosis not present

## 2022-07-29 NOTE — Progress Notes (Unsigned)
Location:  [facility] Room Number: 152A Place of Service:  SNF [phone])  Provider: Tally Joe  PCP: Mahlon Gammon, MD Patient Care Team: Mahlon Gammon, MD as PCP - General (Internal Medicine) Jake Bathe, MD as PCP - Cardiology (Cardiology) Hillis Range, MD (Inactive) as Consulting Physician (Cardiology) Waymon Budge, MD as Consulting Physician (Pulmonary Disease) Jethro Bolus, MD as Consulting Physician (Ophthalmology) Charolett Bumpers, MD as Consulting Physician (Gastroenterology) Ollen Gross, MD as Consulting Physician (Orthopedic Surgery)  Extended Emergency Contact Information Primary Emergency Contact: Corlis Leak of [place] Home Phone: [phone] Mobile Phone: [phone] Relation: Daughter Secondary Emergency Contact: Abram Sander of [place] Home Phone: [phone] Mobile Phone: [phone] Relation: Son  Code Status: DNR Goals of care:  Advanced Directive information    07/29/2022    8:57 AM  Advanced Directives  Does Patient Have a Medical Advance Directive? Yes  Type of Estate agent of [place];Living will;Out of facility DNR (pink MOST or yellow form)  Does patient want to make changes to medical advance directive? No - Patient declined  Copy of Healthcare Power of Attorney in Chart? Yes - validated most recent copy scanned in chart (See row information)  Pre-existing out of facility DNR order (yellow form or pink MOST form) Pink MOST form placed in chart (order not valid for inpatient use);Yellow form placed in chart (order not valid for inpatient use)     Allergies  Allergen Reactions   Combigan [Brimonidine Tartrate-Timolol] Other (See Comments)    Systemic malaise amigen eye drop   Tape Other (See Comments)    SKIN IS VERY FRAGILE!!   Penicillins Hives   Breo Ellipta [Fluticasone Furoate-Vilanterol] Other (See Comments) and Hypertension     Increased blood pressure   Fluticasone    Other     SEASONAL ALLERGIES   Timolol    Vancomycin Other (See Comments)    Red man syndrome - Mild redness and discomfort. Able to complete initial dose.     Chief Complaint  Patient presents with   Acute Visit    Patient is being seen fr discharge     HPI:  89 y.o. female      Past Medical History:  Diagnosis Date   Anemia    Aortic stenosis    a. Echo 09/06/12 EF 55-60%, no WMA, G2DD, Ao valve sclerosis w/ mod stenosis, LA mildly dilated, PA pressure   Asthmatic bronchitis    Complete heart block    s/p permanent pacemaker 06/27/1999 (Battery change 06/2007 and 2016).  s/p AV nodal ablation by Dr Johney Frame 2016.   COPD (chronic obstructive pulmonary disease)    Dr. Maple Hudson   DDD (degenerative disc disease)    Diastolic dysfunction    a. Echo 09/06/12 EF 55-60%, no WMA, G2DD, Ao valve sclerosis w/ mod stenosis, LA mildly dilated, PA pressure   Diverticulosis    DVT (deep vein thrombosis) in pregnancy 1954   a. LLE   Hypertension    Hypothyroidism    on medication   Macular degeneration    Dr. Jesusita Oka   Osteoarthrosis, unspecified whether generalized or localized, other specified sites    PAF (paroxysmal atrial fibrillation)    Stopped flecainide, on amiodarone but still has bouts of A FIB (mostly in mornings).    Pure hypercholesterolemia    Staghorn calculus    Left    Past Surgical History:  Procedure Laterality Date   APPENDECTOMY  ~  1941   AV NODE ABLATION  05/10/2014   AV NODE ABLATION N/A 05/10/2014   Procedure: AV NODE ABLATION;  Surgeon: Hillis Range, MD;  Location: [facility] CATH LAB;  Service: Cardiovascular;  Laterality: N/A;   BUNIONECTOMY WITH HAMMERTOE RECONSTRUCTION Bilateral ~ 1990   CARDIAC PACEMAKER PLACEMENT  06/27/99   Medtronic PM implanted by Dr Amil Amen   CARDIOVERSION N/A 12/02/2013   Procedure: CARDIOVERSION;  Surgeon: Pricilla Riffle, MD;  Location: [facility] ENDOSCOPY;  Service: Cardiovascular;  Laterality:  N/A;   CATARACT EXTRACTION W/ INTRAOCULAR LENS  IMPLANT, BILATERAL Bilateral    COLONOSCOPY WITH PROPOFOL N/A 04/22/2013   Procedure: COLONOSCOPY WITH PROPOFOL;  Surgeon: Charolett Bumpers, MD;  Location: [facility] ENDOSCOPY;  Service: Endoscopy;  Laterality: N/A;   DILATION AND CURETTAGE OF UTERUS  X 2   "when I was going thru menopause"   ESOPHAGOGASTRODUODENOSCOPY (EGD) WITH PROPOFOL N/A 04/22/2013   Procedure: ESOPHAGOGASTRODUODENOSCOPY (EGD) WITH PROPOFOL;  Surgeon: Charolett Bumpers, MD;  Location: [facility] ENDOSCOPY;  Service: Endoscopy;  Laterality: N/A;   HERNIA REPAIR     INCISIONAL HERNIA REPAIR     INSERT / REPLACE / REMOVE PACEMAKER  06/2007   "took out the old; put in new"   INSERT / REPLACE / REMOVE PACEMAKER  05/10/2014   MDT PPM generator change by Dr Johney Frame   JOINT REPLACEMENT     PARTIAL NEPHRECTOMY Left 05/1974   stone disease   PERMANENT PACEMAKER GENERATOR CHANGE N/A 05/10/2014   Procedure: PERMANENT PACEMAKER GENERATOR CHANGE;  Surgeon: Hillis Range, MD;  Location: [facility] CATH LAB;  Service: Cardiovascular;  Laterality: N/A;   TONSILLECTOMY AND ADENOIDECTOMY  1930's   TOTAL KNEE ARTHROPLASTY Right 2001      reports that she has never smoked. She has never used smokeless tobacco. She reports current alcohol use of about 7.0 standard drinks of alcohol per week. She reports that she does not use drugs. Social History   Socioeconomic History   Marital status: Widowed    Spouse name: Not on file   Number of children: 2   Years of education: Not on file   Highest education level: Not on file  Occupational History   Occupation: RETIRED    Employer: RETIRED    Comment: Family [employer] business  Tobacco Use   Smoking status: Never   Smokeless tobacco: Never  Vaping Use   Vaping Use: Never used  Substance and Sexual Activity   Alcohol use: Yes    Alcohol/week: 7.0 standard drinks of alcohol    Types: 7 Glasses of wine per week    Comment: occasionally   Drug use: No   Sexual activity:  Not Currently    Partners: Male  Other Topics Concern   Not on file  Social History Narrative   Diet? Low salt, low fat      Do you drink/eat things with caffeine? yes      Marital status?                 married                   What year were you married? 1949      Do you live in a house, apartment, assisted living, condo, trailer, etc.? apartment      Is it one or more stories? 3 stories      How many persons live in your home? Just me      Do you have any pets in your home? (please  list) no      Current or past profession: accounting      Do you exercise?          yes                            Type & how often? Walk, class, prescribed daily      Do you have a living will? yes      Do you have a DNR form?     yes                             If not, do you want to discuss one?      Do you have signed POA/HPOA for forms? no         Social Determinants of Health   Financial Resource Strain: Low Risk  (02/16/2021)   Overall Financial Resource Strain (CARDIA)    Difficulty of Paying Living Expenses: Not hard at all  Food Insecurity: No Food Insecurity (04/08/2022)   Hunger Vital Sign    Worried About Running Out of Food in the Last Year: Never true    Ran Out of Food in the Last Year: Never true  Transportation Needs: No Transportation Needs (04/08/2022)   PRAPARE - Administrator, Civil Service (Medical): No    Lack of Transportation (Non-Medical): No  Physical Activity: Sufficiently Active (02/16/2021)   Exercise Vital Sign    Days of Exercise per Week: 7 days    Minutes of Exercise per Session: 30 min  Stress: No Stress Concern Present (02/16/2021)   [employer] of Occupational Health - Occupational Stress Questionnaire    Feeling of Stress : Not at all  Social Connections: Moderately Isolated (02/16/2021)   Social Connection and Isolation Panel [NHANES]    Frequency of Communication with Friends and Family: More than three times a week     Frequency of Social Gatherings with Friends and Family: More than three times a week    Attends Religious Services: Never    Database administrator or Organizations: Yes    Attends Engineer, structural: More than 4 times per year    Marital Status: Widowed  Intimate Partner Violence: Not At Risk (04/08/2022)   Humiliation, Afraid, Rape, and Kick questionnaire    Fear of Current or Ex-Partner: No    Emotionally Abused: No    Physically Abused: No    Sexually Abused: No   Functional Status Survey:    Allergies  Allergen Reactions   Combigan [Brimonidine Tartrate-Timolol] Other (See Comments)    Systemic malaise amigen eye drop   Tape Other (See Comments)    SKIN IS VERY FRAGILE!!   Penicillins Hives   Breo Ellipta [Fluticasone Furoate-Vilanterol] Other (See Comments) and Hypertension    Increased blood pressure   Fluticasone    Other     SEASONAL ALLERGIES   Timolol    Vancomycin Other (See Comments)    Red man syndrome - Mild redness and discomfort. Able to complete initial dose.     Pertinent  Health Maintenance Due  Topic Date Due   INFLUENZA VACCINE  11/07/2022   DEXA SCAN  Discontinued    Medications: Outpatient Encounter Medications as of 07/29/2022  Medication Sig   albuterol (VENTOLIN HFA) 108 (90 Base) MCG/ACT inhaler Inhale 2 puffs into the lungs every 6 (six) hours as needed for wheezing  or shortness of breath.   Azelastine HCl 137 MCG/SPRAY SOLN USE 1 TO 2 SPRAYS INTO BOTH NOSTRILS TWICE DAILY.   Calcium 600-10 MG-MCG CHEW Chew 1 capsule by mouth daily.   Cholecalciferol (VITAMIN D) 125 MCG (5000 UT) CAPS Take 1 capsule by mouth daily.   dorzolamide (TRUSOPT) 2 % ophthalmic solution Place 1 drop into both eyes 2 (two) times daily.   famotidine (PEPCID) 40 MG tablet Take 1 tablet (40 mg total) by mouth at bedtime.   fexofenadine (ALLEGRA) 180 MG tablet Take 180 mg by mouth daily.   fluticasone (FLONASE) 50 MCG/ACT nasal spray USE 2 SPRAYS EACH NOSTRIL  ONCE A DAY AS NEEDED FOR ALLERGIES OR CONGESTION.   gabapentin (NEURONTIN) 100 MG capsule Take 1 capsule (100 mg total) by mouth at bedtime.   gabapentin (NEURONTIN) 100 MG capsule Take 1 capsule (100 mg total) by mouth daily as needed.   ipratropium-albuterol (DUONEB) 0.5-2.5 (3) MG/3ML SOLN Take 3 mLs by nebulization 3 (three) times daily as needed.   ipratropium-albuterol (DUONEB) 0.5-2.5 (3) MG/3ML SOLN Take 3 mLs by nebulization at bedtime.   latanoprost (XALATAN) 0.005 % ophthalmic solution Place 1 drop into both eyes at bedtime.   levothyroxine (SYNTHROID) 75 MCG tablet Take 1 tablet (75 mcg total) by mouth daily.   losartan (COZAAR) 50 MG tablet Take 1 tablet (50 mg total) by mouth daily.   Lutein 20 MG CAPS Take 1 capsule (20 mg total) by mouth daily.   Magnesium 500 MG TABS Take 750 mg by mouth See admin instructions. Take 750 mg by mouth every evening (in conjunction with 3 BioComplete capsules)   metoprolol tartrate (LOPRESSOR) 50 MG tablet Take 1.5 tablets (75 mg total) by mouth in the morning.   mometasone-formoterol (DULERA) 100-5 MCG/ACT AERO Inhale 2 puffs then rinse mouth, twice daily   montelukast (SINGULAIR) 10 MG tablet Take 1 tablet (10 mg total) by mouth daily.   Multiple Vitamins-Minerals (PRESERVISION AREDS 2) CAPS Take 1 capsule by mouth in the morning and at bedtime.   NON FORMULARY Take 3 capsules by mouth See admin instructions. Gundry MD Bio Complete 3 - Prebiotic, Probiotic, Postbiotic to Support Optimal Gut Health capsules- TAKE 3 CAPSULES BY MOUTH EVERY EVENING (in conjunction with 750 mg Magnesium)   potassium chloride (KLOR-CON) 10 MEQ tablet Take 2 tablets (20 mEq total) by mouth daily.   predniSONE (DELTASONE) 2.5 MG tablet Take 1 tablet (2.5 mg total) by mouth daily with breakfast.   rivaroxaban (XARELTO) 20 MG TABS tablet Take 1 tablet (20 mg total) by mouth daily with supper.   sennosides-docusate sodium (SENOKOT-S) 8.6-50 MG tablet Take 2 tablets by mouth  at bedtime.   torsemide (DEMADEX) 20 MG tablet Take 1 tablet (20 mg total) by mouth daily as needed. 3 lb weight gain in 1 day or 5 lb in 1 week or excessive edema (Patient taking differently: Take 10 mg by mouth daily as needed. 3 lb weight gain in 1 day or 5 lb in 1 week or excessive edema)   torsemide (DEMADEX) 20 MG tablet Take 1.5 tablets (30 mg total) by mouth daily.   TYLENOL 500 MG tablet Take 1,000 mg by mouth in the morning, at noon, and at bedtime.   No facility-administered encounter medications on file as of 07/29/2022.    Review of Systems  Vitals:   07/29/22 0846  BP: (!) 109/58  Pulse: 65  Resp: 16  Temp: 97.7 F (36.5 C)  TempSrc: Temporal  SpO2: 97%  Weight: 128 lb 6.4 oz (58.2 kg)  Height: 5' (1.524 m)   Body mass index is 25.08 kg/m. Physical Exam  Labs reviewed: Basic Metabolic Panel: Recent Labs    04/08/22 1045 04/09/22 0501 04/15/22 0000 07/09/22 0000  NA 139 136 143 141  K 3.0* 3.6 3.6 3.9  CL 101 101 101 102  CO2 26 24 32* 27*  GLUCOSE 142* 196*  --   --   BUN 39* 39* 29* 38*  CREATININE 1.43* 1.39* 1.2* 1.2*  CALCIUM 8.9 8.8* 9.5 9.5   Liver Function Tests: Recent Labs    03/21/22 0000 04/08/22 1045 07/09/22 0000  AST 34  34 22 20  ALT 37*  37* 15 11  ALKPHOS 53  53 42 67  BILITOT  --  1.4*  --   PROT  --  6.2*  --   ALBUMIN 4.1  4.1 4.0 3.6   No results for input(s): "LIPASE", "AMYLASE" in the last 8760 hours. No results for input(s): "AMMONIA" in the last 8760 hours. CBC: Recent Labs    04/08/22 1045 04/09/22 0501 04/15/22 0000 07/09/22 0000  WBC 12.8* 9.8 10.8  10.8 8.2  NEUTROABS 8.7*  --  8.00  --   HGB 12.0 11.5* 12.0  12.0 11.4*  HCT 38.4 36.7 37  37 35*  MCV 100.0 100.3*  --   --   PLT 223 217 330  330 294   Cardiac Enzymes: No results for input(s): "CKTOTAL", "CKMB", "CKMBINDEX", "TROPONINI" in the last 8760 hours. BNP: Invalid input(s): "POCBNP" CBG: No results for input(s): "GLUCAP" in the last  8760 hours.  Procedures and Imaging Studies During Stay: CT Chest High Resolution  Result Date: 07/08/2022 CLINICAL DATA:  Lung nodule on chest radiograph. EXAM: CT CHEST WITHOUT CONTRAST TECHNIQUE: Multidetector CT imaging of the chest was performed following the standard protocol without intravenous contrast. High resolution imaging of the lungs, as well as inspiratory and expiratory imaging, was performed. RADIATION DOSE REDUCTION: This exam was performed according to the departmental dose-optimization program which includes automated exposure control, adjustment of the mA and/or kV according to patient size and/or use of iterative reconstruction technique. COMPARISON:  Chest radiograph 06/24/2022 and CT chest 04/11/2021. FINDINGS: Cardiovascular: Atherosclerotic calcification of the aorta, aortic valve and coronary arteries. Enlarged pulmonic trunk and heart. No pericardial effusion. Mediastinum/Nodes: No pathologically enlarged mediastinal or axillary lymph nodes. Hilar regions are difficult to definitively evaluate without IV contrast. Esophagus is grossly unremarkable. Lungs/Pleura: Calcified granulomas. Tiny left pleural effusion with subpleural volume loss in the adjacent left lower lobe. Airway is unremarkable. Assessment for air trapping is limited due to lack of true expiratory phase imaging. Upper Abdomen: Small low-density lesion in the left hepatic lobe, too small to characterize. Visualized portions of the liver and adrenal glands are otherwise unremarkable. Hyperdense lesion in the upper pole right kidney. No specific follow-up necessary. Visualized portions of the kidneys, spleen, pancreas, stomach and bowel are grossly unremarkable with the exception of a small hiatal hernia. No upper abdominal adenopathy. Musculoskeletal: Degenerative changes in the spine. No worrisome lytic or sclerotic lesions. IMPRESSION: 1. No evidence of interstitial lung disease. 2. No suspicious pulmonary nodules. 3.  Tiny left pleural effusion with adjacent subpleural volume loss in the left lower lobe. 4. Aortic atherosclerosis (ICD10-I70.0). Coronary artery calcification. 5. Enlarged pulmonic trunk, indicative of pulmonary arterial hypertension. Electronically Signed   By: Leanna Battles M.D.   On: 07/08/2022 10:57   DG Elbow Complete Left  Result Date: 07/03/2022 CLINICAL  DATA:  Trauma, fall EXAM: LEFT ELBOW - COMPLETE 3+ VIEW COMPARISON:  None Available. FINDINGS: No displaced fracture or dislocation is seen. There is soft tissue deformity along the dorsal aspect of the proximal ulna. There are few smooth marginated calcifications adjacent to the bony structures, possibly ligament calcifications from previous injury. There is no displacement of posterior fat pad. IMPRESSION: No recent fracture or dislocation is seen in left elbow. Electronically Signed   By: Ernie Avena M.D.   On: 07/03/2022 14:32   DG Wrist Complete Left  Result Date: 07/03/2022 CLINICAL DATA:  Trauma, fall, pain EXAM: LEFT WRIST - COMPLETE 3+ VIEW COMPARISON:  None Available. FINDINGS: No displaced fracture or dislocation is seen. Osteopenia is seen in bony structures. Marked degenerative changes are noted in first carpometacarpal joint. There is subluxation in first carpometacarpal joint. Calcifications are noted adjacent to distal ulna and carpals, possibly ligament or cartilage calcifications due to degenerative arthritis. IMPRESSION: No recent displaced fracture or dislocation is seen. Other findings as described in the body of the report. Electronically Signed   By: Ernie Avena M.D.   On: 07/03/2022 14:30    Assessment/Plan:   There are no diagnoses linked to this encounter.   Patient is being discharged with the following home health services:    Patient is being discharged with the following durable medical equipment:    Patient has been advised to f/u with their PCP in 1-2 weeks to for a transitions of care visit.   Social services at their facility was responsible for arranging this appointment.  Pt was provided with adequate prescriptions of noncontrolled medications to reach the scheduled appointment .  For controlled substances, a limited supply was provided as appropriate for the individual patient.  If the pt normally receives these medications from a pain clinic or has a contract with another physician, these medications should be received from that clinic or physician only).    Future labs/tests needed:  ***

## 2022-07-30 ENCOUNTER — Encounter: Payer: Self-pay | Admitting: Adult Health

## 2022-07-30 DIAGNOSIS — G458 Other transient cerebral ischemic attacks and related syndromes: Secondary | ICD-10-CM | POA: Diagnosis not present

## 2022-07-30 DIAGNOSIS — R278 Other lack of coordination: Secondary | ICD-10-CM | POA: Diagnosis not present

## 2022-07-30 DIAGNOSIS — S638X2A Sprain of other part of left wrist and hand, initial encounter: Secondary | ICD-10-CM | POA: Diagnosis not present

## 2022-07-30 DIAGNOSIS — J418 Mixed simple and mucopurulent chronic bronchitis: Secondary | ICD-10-CM | POA: Diagnosis not present

## 2022-07-30 DIAGNOSIS — Z9181 History of falling: Secondary | ICD-10-CM | POA: Diagnosis not present

## 2022-07-30 DIAGNOSIS — M1712 Unilateral primary osteoarthritis, left knee: Secondary | ICD-10-CM | POA: Diagnosis not present

## 2022-07-31 DIAGNOSIS — R278 Other lack of coordination: Secondary | ICD-10-CM | POA: Diagnosis not present

## 2022-07-31 DIAGNOSIS — Z9181 History of falling: Secondary | ICD-10-CM | POA: Diagnosis not present

## 2022-07-31 DIAGNOSIS — M1712 Unilateral primary osteoarthritis, left knee: Secondary | ICD-10-CM | POA: Diagnosis not present

## 2022-07-31 DIAGNOSIS — S638X2A Sprain of other part of left wrist and hand, initial encounter: Secondary | ICD-10-CM | POA: Diagnosis not present

## 2022-07-31 DIAGNOSIS — G458 Other transient cerebral ischemic attacks and related syndromes: Secondary | ICD-10-CM | POA: Diagnosis not present

## 2022-07-31 DIAGNOSIS — J418 Mixed simple and mucopurulent chronic bronchitis: Secondary | ICD-10-CM | POA: Diagnosis not present

## 2022-08-01 DIAGNOSIS — R278 Other lack of coordination: Secondary | ICD-10-CM | POA: Diagnosis not present

## 2022-08-01 DIAGNOSIS — S638X2A Sprain of other part of left wrist and hand, initial encounter: Secondary | ICD-10-CM | POA: Diagnosis not present

## 2022-08-01 DIAGNOSIS — Z9181 History of falling: Secondary | ICD-10-CM | POA: Diagnosis not present

## 2022-08-01 DIAGNOSIS — G458 Other transient cerebral ischemic attacks and related syndromes: Secondary | ICD-10-CM | POA: Diagnosis not present

## 2022-08-01 DIAGNOSIS — M1712 Unilateral primary osteoarthritis, left knee: Secondary | ICD-10-CM | POA: Diagnosis not present

## 2022-08-01 DIAGNOSIS — J418 Mixed simple and mucopurulent chronic bronchitis: Secondary | ICD-10-CM | POA: Diagnosis not present

## 2022-08-02 DIAGNOSIS — R278 Other lack of coordination: Secondary | ICD-10-CM | POA: Diagnosis not present

## 2022-08-02 DIAGNOSIS — Z9181 History of falling: Secondary | ICD-10-CM | POA: Diagnosis not present

## 2022-08-02 DIAGNOSIS — J418 Mixed simple and mucopurulent chronic bronchitis: Secondary | ICD-10-CM | POA: Diagnosis not present

## 2022-08-02 DIAGNOSIS — G458 Other transient cerebral ischemic attacks and related syndromes: Secondary | ICD-10-CM | POA: Diagnosis not present

## 2022-08-02 DIAGNOSIS — M1712 Unilateral primary osteoarthritis, left knee: Secondary | ICD-10-CM | POA: Diagnosis not present

## 2022-08-02 DIAGNOSIS — S638X2A Sprain of other part of left wrist and hand, initial encounter: Secondary | ICD-10-CM | POA: Diagnosis not present

## 2022-08-05 DIAGNOSIS — S638X2A Sprain of other part of left wrist and hand, initial encounter: Secondary | ICD-10-CM | POA: Diagnosis not present

## 2022-08-05 DIAGNOSIS — Z9181 History of falling: Secondary | ICD-10-CM | POA: Diagnosis not present

## 2022-08-05 DIAGNOSIS — G458 Other transient cerebral ischemic attacks and related syndromes: Secondary | ICD-10-CM | POA: Diagnosis not present

## 2022-08-05 DIAGNOSIS — J418 Mixed simple and mucopurulent chronic bronchitis: Secondary | ICD-10-CM | POA: Diagnosis not present

## 2022-08-05 DIAGNOSIS — M1712 Unilateral primary osteoarthritis, left knee: Secondary | ICD-10-CM | POA: Diagnosis not present

## 2022-08-05 DIAGNOSIS — R278 Other lack of coordination: Secondary | ICD-10-CM | POA: Diagnosis not present

## 2022-08-07 DIAGNOSIS — M25562 Pain in left knee: Secondary | ICD-10-CM | POA: Diagnosis not present

## 2022-08-07 DIAGNOSIS — R278 Other lack of coordination: Secondary | ICD-10-CM | POA: Diagnosis not present

## 2022-08-07 DIAGNOSIS — M6281 Muscle weakness (generalized): Secondary | ICD-10-CM | POA: Diagnosis not present

## 2022-08-07 DIAGNOSIS — M62552 Muscle wasting and atrophy, not elsewhere classified, left thigh: Secondary | ICD-10-CM | POA: Diagnosis not present

## 2022-08-07 DIAGNOSIS — M1712 Unilateral primary osteoarthritis, left knee: Secondary | ICD-10-CM | POA: Diagnosis not present

## 2022-08-07 DIAGNOSIS — M25532 Pain in left wrist: Secondary | ICD-10-CM | POA: Diagnosis not present

## 2022-08-07 DIAGNOSIS — M25662 Stiffness of left knee, not elsewhere classified: Secondary | ICD-10-CM | POA: Diagnosis not present

## 2022-08-07 DIAGNOSIS — S638X2A Sprain of other part of left wrist and hand, initial encounter: Secondary | ICD-10-CM | POA: Diagnosis not present

## 2022-08-07 DIAGNOSIS — Z9181 History of falling: Secondary | ICD-10-CM | POA: Diagnosis not present

## 2022-08-08 DIAGNOSIS — M25532 Pain in left wrist: Secondary | ICD-10-CM | POA: Diagnosis not present

## 2022-08-08 DIAGNOSIS — M25562 Pain in left knee: Secondary | ICD-10-CM | POA: Diagnosis not present

## 2022-08-08 DIAGNOSIS — Z9181 History of falling: Secondary | ICD-10-CM | POA: Diagnosis not present

## 2022-08-08 DIAGNOSIS — S638X2A Sprain of other part of left wrist and hand, initial encounter: Secondary | ICD-10-CM | POA: Diagnosis not present

## 2022-08-08 DIAGNOSIS — R278 Other lack of coordination: Secondary | ICD-10-CM | POA: Diagnosis not present

## 2022-08-08 DIAGNOSIS — M1712 Unilateral primary osteoarthritis, left knee: Secondary | ICD-10-CM | POA: Diagnosis not present

## 2022-08-09 DIAGNOSIS — M1712 Unilateral primary osteoarthritis, left knee: Secondary | ICD-10-CM | POA: Diagnosis not present

## 2022-08-09 DIAGNOSIS — M25562 Pain in left knee: Secondary | ICD-10-CM | POA: Diagnosis not present

## 2022-08-09 DIAGNOSIS — M25532 Pain in left wrist: Secondary | ICD-10-CM | POA: Diagnosis not present

## 2022-08-09 DIAGNOSIS — R278 Other lack of coordination: Secondary | ICD-10-CM | POA: Diagnosis not present

## 2022-08-09 DIAGNOSIS — S638X2A Sprain of other part of left wrist and hand, initial encounter: Secondary | ICD-10-CM | POA: Diagnosis not present

## 2022-08-09 DIAGNOSIS — Z9181 History of falling: Secondary | ICD-10-CM | POA: Diagnosis not present

## 2022-08-12 DIAGNOSIS — Z9181 History of falling: Secondary | ICD-10-CM | POA: Diagnosis not present

## 2022-08-12 DIAGNOSIS — M1712 Unilateral primary osteoarthritis, left knee: Secondary | ICD-10-CM | POA: Diagnosis not present

## 2022-08-12 DIAGNOSIS — R278 Other lack of coordination: Secondary | ICD-10-CM | POA: Diagnosis not present

## 2022-08-12 DIAGNOSIS — S638X2A Sprain of other part of left wrist and hand, initial encounter: Secondary | ICD-10-CM | POA: Diagnosis not present

## 2022-08-12 DIAGNOSIS — M25532 Pain in left wrist: Secondary | ICD-10-CM | POA: Diagnosis not present

## 2022-08-12 DIAGNOSIS — M25562 Pain in left knee: Secondary | ICD-10-CM | POA: Diagnosis not present

## 2022-08-13 ENCOUNTER — Non-Acute Institutional Stay: Payer: Medicare Other | Admitting: Internal Medicine

## 2022-08-13 ENCOUNTER — Encounter: Payer: Self-pay | Admitting: Internal Medicine

## 2022-08-13 VITALS — BP 112/68 | HR 74 | Temp 97.6°F | Resp 17 | Ht 60.0 in | Wt 131.0 lb

## 2022-08-13 DIAGNOSIS — I4821 Permanent atrial fibrillation: Secondary | ICD-10-CM | POA: Diagnosis not present

## 2022-08-13 DIAGNOSIS — R278 Other lack of coordination: Secondary | ICD-10-CM | POA: Diagnosis not present

## 2022-08-13 DIAGNOSIS — I5032 Chronic diastolic (congestive) heart failure: Secondary | ICD-10-CM | POA: Diagnosis not present

## 2022-08-13 DIAGNOSIS — E039 Hypothyroidism, unspecified: Secondary | ICD-10-CM | POA: Diagnosis not present

## 2022-08-13 DIAGNOSIS — K219 Gastro-esophageal reflux disease without esophagitis: Secondary | ICD-10-CM | POA: Diagnosis not present

## 2022-08-13 DIAGNOSIS — Z9181 History of falling: Secondary | ICD-10-CM | POA: Diagnosis not present

## 2022-08-13 DIAGNOSIS — S60212S Contusion of left wrist, sequela: Secondary | ICD-10-CM | POA: Diagnosis not present

## 2022-08-13 DIAGNOSIS — I82412 Acute embolism and thrombosis of left femoral vein: Secondary | ICD-10-CM

## 2022-08-13 DIAGNOSIS — M1712 Unilateral primary osteoarthritis, left knee: Secondary | ICD-10-CM | POA: Diagnosis not present

## 2022-08-13 DIAGNOSIS — M25532 Pain in left wrist: Secondary | ICD-10-CM | POA: Diagnosis not present

## 2022-08-13 DIAGNOSIS — S638X2A Sprain of other part of left wrist and hand, initial encounter: Secondary | ICD-10-CM | POA: Diagnosis not present

## 2022-08-13 DIAGNOSIS — J449 Chronic obstructive pulmonary disease, unspecified: Secondary | ICD-10-CM | POA: Diagnosis not present

## 2022-08-13 DIAGNOSIS — M25562 Pain in left knee: Secondary | ICD-10-CM | POA: Diagnosis not present

## 2022-08-13 NOTE — Progress Notes (Unsigned)
Location: [facility] features editor of Service:  Clinic (12)  Provider:   Code Status: DNR Goals of Care:     08/13/2022    9:35 AM  Advanced Directives  Does Patient Have a Medical Advance Directive? Yes  Type of Estate agent of [place];Living will;Out of facility DNR (pink MOST or yellow form)  Does patient want to make changes to medical advance directive? No - Patient declined  Copy of Healthcare Power of Attorney in Chart? Yes - validated most recent copy scanned in chart (See row information)  Pre-existing out of facility DNR order (yellow form or pink MOST form) Pink MOST form placed in chart (order not valid for inpatient use);Yellow form placed in chart (order not valid for inpatient use)     Chief Complaint  Patient presents with   Medical Management of Chronic Issues    Patient is here for a follow up and discuss some pain she is still having     HPI: Patient is a 89 y.o. female seen today for an acute visit for follow up  Lives in [place] with her caregiver Now has 24/7 caregivers   Patient has h/o A. fib on Xarelto s/p AV nodal ablation, s/p PPM in 2009 Aortic stenosis  COPD with frequent exacerbations.  Not on oxygen.  On chronic prednisone.  Follows closely with Dr. Maple Hudson.  Is enrolled in palliative care  Chronic diastolic CHF  History of chronic venous insufficiency Vision loss due to macular degeneration and glaucoma Very HOH Osteopenia   Pneumonia in 01/24 Diagnosed with Covid on 05/20/22 treated with Molnupiravir  Acute DVT in 06/12/2022 when she came with LLE swelling Now on Higher dose of Xarelto  Abnormal Xray CT scan of Lung Shows no Nodules   Left Hand Contusion after fall in 07/08/22 Feels Numb in her Hands especially in her Medial 2 fingers and the thumb but pain better Also has less strength in her hand  Is working with therapy Left Knee swelling and pain Swelling is better but has pain and feels like  it getting  buckle when she walks Walks with her walker  Tylenol for pain  Working with therapy  Past Medical History:  Diagnosis Date   Anemia    Aortic stenosis    a. Echo 09/06/12 EF 55-60%, no WMA, G2DD, Ao valve sclerosis w/ mod stenosis, LA mildly dilated, PA pressure   Asthmatic bronchitis    Complete heart block (HCC)    s/p permanent pacemaker 06/27/1999 (Battery change 06/2007 and 2016).  s/p AV nodal ablation by Dr Johney Frame 2016.   COPD (chronic obstructive pulmonary disease) (HCC)    Dr. Maple Hudson   DDD (degenerative disc disease)    Diastolic dysfunction    a. Echo 09/06/12 EF 55-60%, no WMA, G2DD, Ao valve sclerosis w/ mod stenosis, LA mildly dilated, PA pressure   Diverticulosis    DVT (deep vein thrombosis) in pregnancy 1954   a. LLE   Hypertension    Hypothyroidism    on medication   Macular degeneration    Dr. Jesusita Oka   Osteoarthrosis, unspecified whether generalized or localized, other specified sites    PAF (paroxysmal atrial fibrillation) (HCC)    Stopped flecainide, on amiodarone but still has bouts of A FIB (mostly in mornings).    Pure hypercholesterolemia    Staghorn calculus    Left    Past Surgical History:  Procedure Laterality Date   APPENDECTOMY  ~ 1941  AV NODE ABLATION  05/10/2014   AV NODE ABLATION N/A 05/10/2014   Procedure: AV NODE ABLATION;  Surgeon: Hillis Range, MD;  Location: [facility] CATH LAB;  Service: Cardiovascular;  Laterality: N/A;   BUNIONECTOMY WITH HAMMERTOE RECONSTRUCTION Bilateral ~ 1990   CARDIAC PACEMAKER PLACEMENT  06/27/99   Medtronic PM implanted by Dr Amil Amen   CARDIOVERSION N/A 12/02/2013   Procedure: CARDIOVERSION;  Surgeon: Pricilla Riffle, MD;  Location: [facility] ENDOSCOPY;  Service: Cardiovascular;  Laterality: N/A;   CATARACT EXTRACTION W/ INTRAOCULAR LENS  IMPLANT, BILATERAL Bilateral    COLONOSCOPY WITH PROPOFOL N/A 04/22/2013   Procedure: COLONOSCOPY WITH PROPOFOL;  Surgeon: Charolett Bumpers, MD;  Location: [facility] ENDOSCOPY;   Service: Endoscopy;  Laterality: N/A;   DILATION AND CURETTAGE OF UTERUS  X 2   "when I was going thru menopause"   ESOPHAGOGASTRODUODENOSCOPY (EGD) WITH PROPOFOL N/A 04/22/2013   Procedure: ESOPHAGOGASTRODUODENOSCOPY (EGD) WITH PROPOFOL;  Surgeon: Charolett Bumpers, MD;  Location: [facility] ENDOSCOPY;  Service: Endoscopy;  Laterality: N/A;   HERNIA REPAIR     INCISIONAL HERNIA REPAIR     INSERT / REPLACE / REMOVE PACEMAKER  06/2007   "took out the old; put in new"   INSERT / REPLACE / REMOVE PACEMAKER  05/10/2014   MDT PPM generator change by Dr Johney Frame   JOINT REPLACEMENT     PARTIAL NEPHRECTOMY Left 05/1974   stone disease   PERMANENT PACEMAKER GENERATOR CHANGE N/A 05/10/2014   Procedure: PERMANENT PACEMAKER GENERATOR CHANGE;  Surgeon: Hillis Range, MD;  Location: [facility] CATH LAB;  Service: Cardiovascular;  Laterality: N/A;   TONSILLECTOMY AND ADENOIDECTOMY  1930's   TOTAL KNEE ARTHROPLASTY Right 2001    Allergies  Allergen Reactions   Combigan [Brimonidine Tartrate-Timolol] Other (See Comments)    Systemic malaise amigen eye drop   Tape Other (See Comments)    SKIN IS VERY FRAGILE!!   Penicillins Hives   Breo Ellipta [Fluticasone Furoate-Vilanterol] Other (See Comments) and Hypertension    Increased blood pressure   Fluticasone    Other     SEASONAL ALLERGIES   Timolol    Vancomycin Other (See Comments)    Red man syndrome - Mild redness and discomfort. Able to complete initial dose.     Outpatient Encounter Medications as of 08/13/2022  Medication Sig   albuterol (VENTOLIN HFA) 108 (90 Base) MCG/ACT inhaler Inhale 2 puffs into the lungs every 6 (six) hours as needed for wheezing or shortness of breath.   Azelastine HCl 137 MCG/SPRAY SOLN USE 1 TO 2 SPRAYS INTO BOTH NOSTRILS TWICE DAILY.   Calcium 600-10 MG-MCG CHEW Chew 1 capsule by mouth daily.   Cholecalciferol (VITAMIN D) 125 MCG (5000 UT) CAPS Take 1 capsule by mouth daily.   dorzolamide (TRUSOPT) 2 % ophthalmic solution Place 1 drop  into both eyes 2 (two) times daily.   famotidine (PEPCID) 40 MG tablet Take 1 tablet (40 mg total) by mouth at bedtime.   fexofenadine (ALLEGRA) 180 MG tablet Take 180 mg by mouth daily.   fluticasone (FLONASE) 50 MCG/ACT nasal spray USE 2 SPRAYS EACH NOSTRIL ONCE A DAY AS NEEDED FOR ALLERGIES OR CONGESTION.   gabapentin (NEURONTIN) 100 MG capsule Take 1 capsule (100 mg total) by mouth at bedtime.   ipratropium-albuterol (DUONEB) 0.5-2.5 (3) MG/3ML SOLN Take 3 mLs by nebulization 3 (three) times daily as needed.   ipratropium-albuterol (DUONEB) 0.5-2.5 (3) MG/3ML SOLN Take 3 mLs by nebulization at bedtime.   latanoprost (XALATAN) 0.005 % ophthalmic solution Place 1  drop into both eyes at bedtime.   levothyroxine (SYNTHROID) 75 MCG tablet Take 1 tablet (75 mcg total) by mouth daily.   losartan (COZAAR) 50 MG tablet Take 1 tablet (50 mg total) by mouth daily.   Lutein 20 MG CAPS Take 1 capsule (20 mg total) by mouth daily.   Magnesium 500 MG TABS Take 750 mg by mouth See admin instructions. Take 750 mg by mouth every evening (in conjunction with 3 BioComplete capsules)   metoprolol tartrate (LOPRESSOR) 50 MG tablet Take 1.5 tablets (75 mg total) by mouth in the morning.   mometasone-formoterol (DULERA) 100-5 MCG/ACT AERO Inhale 2 puffs then rinse mouth, twice daily   montelukast (SINGULAIR) 10 MG tablet Take 1 tablet (10 mg total) by mouth daily.   Multiple Vitamins-Minerals (PRESERVISION AREDS 2) CAPS Take 1 capsule by mouth in the morning and at bedtime.   NON FORMULARY Take 3 capsules by mouth See admin instructions. Gundry MD Bio Complete 3 - Prebiotic, Probiotic, Postbiotic to Support Optimal Gut Health capsules- TAKE 3 CAPSULES BY MOUTH EVERY EVENING (in conjunction with 750 mg Magnesium)   potassium chloride (KLOR-CON) 10 MEQ tablet Take 2 tablets (20 mEq total) by mouth daily.   predniSONE (DELTASONE) 2.5 MG tablet Take 1 tablet (2.5 mg total) by mouth daily with breakfast.   rivaroxaban  (XARELTO) 20 MG TABS tablet Take 1 tablet (20 mg total) by mouth daily with supper.   torsemide (DEMADEX) 20 MG tablet Take 1 tablet (20 mg total) by mouth daily as needed. 3 lb weight gain in 1 day or 5 lb in 1 week or excessive edema   torsemide (DEMADEX) 20 MG tablet Take 1.5 tablets (30 mg total) by mouth daily.   TYLENOL 500 MG tablet Take 1,000 mg by mouth in the morning, at noon, and at bedtime.   [DISCONTINUED] gabapentin (NEURONTIN) 100 MG capsule Take 1 capsule (100 mg total) by mouth daily as needed. (Patient not taking: Reported on 08/13/2022)   [DISCONTINUED] sennosides-docusate sodium (SENOKOT-S) 8.6-50 MG tablet Take 2 tablets by mouth at bedtime. (Patient not taking: Reported on 08/13/2022)   No facility-administered encounter medications on file as of 08/13/2022.    Review of Systems:  Review of Systems  Constitutional:  Negative for activity change and appetite change.  HENT:  Positive for hearing loss.   Eyes:  Positive for visual disturbance.  Respiratory:  Positive for cough. Negative for shortness of breath.   Cardiovascular:  Negative for leg swelling.  Gastrointestinal:  Negative for constipation.  Genitourinary: Negative.   Musculoskeletal:  Positive for arthralgias, gait problem and myalgias.  Skin: Negative.   Neurological:  Negative for dizziness and weakness.  Psychiatric/Behavioral:  Negative for confusion, dysphoric mood and sleep disturbance.     Health Maintenance  Topic Date Due   INFLUENZA VACCINE  11/07/2022   Medicare Annual Wellness (AWV)  03/19/2023   DTaP/Tdap/Td (2 - Td or Tdap) 02/23/2029   Pneumonia Vaccine 61+ Years old  Completed   COVID-19 Vaccine  Completed   Zoster Vaccines- Shingrix  Completed   HPV VACCINES  Aged Out   DEXA SCAN  Discontinued    Physical Exam: Vitals:   08/13/22 0936  BP: 112/68  Pulse: 74  Resp: 17  Temp: 97.6 F (36.4 C)  TempSrc: Temporal  SpO2: 96%  Weight: 131 lb (59.4 kg)  Height: 5' (1.524 m)   Body  mass index is 25.58 kg/m. Physical Exam Vitals reviewed.  Constitutional:      Appearance: Normal  appearance.  HENT:     Head: Normocephalic.     Nose: Nose normal.     Mouth/Throat:     Mouth: Mucous membranes are moist.     Pharynx: Oropharynx is clear.  Eyes:     Pupils: Pupils are equal, round, and reactive to light.  Cardiovascular:     Rate and Rhythm: Normal rate and regular rhythm.     Pulses: Normal pulses.     Heart sounds: Normal heart sounds. No murmur heard. Pulmonary:     Effort: Pulmonary effort is normal. No respiratory distress.     Breath sounds: Normal breath sounds. No wheezing.  Abdominal:     General: Abdomen is flat. Bowel sounds are normal.     Palpations: Abdomen is soft.  Musculoskeletal:        General: No swelling.     Cervical back: Neck supple.     Comments: Left Knee is slightly swollen and has some tenderness in Medial side Can feel Bakers Cyst  Left hand No swelling Can move without any pain Has lost sensation in her Median Nerve area     Skin:    General: Skin is warm.  Neurological:     General: No focal deficit present.     Mental Status: She is alert and oriented to person, place, and time.  Psychiatric:        Mood and Affect: Mood normal.        Thought Content: Thought content normal.    Labs reviewed: Basic Metabolic Panel: Recent Labs    11/15/21 1531 11/29/21 0000 04/08/22 1045 04/09/22 0501 04/15/22 0000 07/09/22 0000  NA 146   < > 139 136 143 141  K 3.8   < > 3.0* 3.6 3.6 3.9  CL 98*   < > 101 101 101 102  CO2 28*   < > 26 24 32* 27*  GLUCOSE  --   --  142* 196*  --   --   BUN 49*   < > 39* 39* 29* 38*  CREATININE 1.5*   < > 1.43* 1.39* 1.2* 1.2*  CALCIUM 10.1   < > 8.9 8.8* 9.5 9.5  TSH 2.57  --   --   --   --   --    < > = values in this interval not displayed.   Liver Function Tests: Recent Labs    03/21/22 0000 04/08/22 1045 07/09/22 0000  AST 34  34 22 20  ALT 37*  37* 15 11  ALKPHOS 53   53 42 67  BILITOT  --  1.4*  --   PROT  --  6.2*  --   ALBUMIN 4.1  4.1 4.0 3.6   No results for input(s): "LIPASE", "AMYLASE" in the last 8760 hours. No results for input(s): "AMMONIA" in the last 8760 hours. CBC: Recent Labs    04/08/22 1045 04/09/22 0501 04/15/22 0000 07/09/22 0000  WBC 12.8* 9.8 10.8  10.8 8.2  NEUTROABS 8.7*  --  8.00  --   HGB 12.0 11.5* 12.0  12.0 11.4*  HCT 38.4 36.7 37  37 35*  MCV 100.0 100.3*  --   --   PLT 223 217 330  330 294   Lipid Panel: No results for input(s): "CHOL", "HDL", "LDLCALC", "TRIG", "CHOLHDL", "LDLDIRECT" in the last 8760 hours. Lab Results  Component Value Date   HGBA1C 5.7 (H) 06/25/2017    Procedures since last visit: No results found.  Assessment/Plan 1. Contusion  of left wrist, sequela Doing much better No Sensation in her Median Nerve area Strength also effected in that area Is working with OT  Pain and swelling is much improved Will send for Follow up in few months if no improvement Not surgical candidate  2. Permanent atrial fibrillation (HCC) On Xarelto and Metoprolol  3. Primary osteoarthritis of left knee Has it injected twice with Gel It does not look like it has helped Is working with therapy able to walk but it buckles up Continue tylenol  Also Follow with ortho  4. Chronic diastolic congestive heart failure (HCC) On Torsemide Doing well  5. Acute deep vein thrombosis (DVT) of femoral vein of left lower extremity (HCC) Possible Post Covid Higher dose of Xarelto since 03/24 Possible reval in 6 months to discuss lower Maintenance dose.   6. COPD mixed type (HCC) Uses Duo neb if needed On Dulera,Singulair and low dose of Prednisone Per Dr Maple Hudson  7. Acquired hypothyroidism TSH normal in 08/23  8. Gastroesophageal reflux disease, unspecified whether esophagitis present Pepcid 9 SSS Pacemaker   Labs/tests ordered:   Next appt:  11/04/2022

## 2022-08-14 DIAGNOSIS — M25532 Pain in left wrist: Secondary | ICD-10-CM | POA: Diagnosis not present

## 2022-08-14 DIAGNOSIS — Z9181 History of falling: Secondary | ICD-10-CM | POA: Diagnosis not present

## 2022-08-14 DIAGNOSIS — R278 Other lack of coordination: Secondary | ICD-10-CM | POA: Diagnosis not present

## 2022-08-14 DIAGNOSIS — M1712 Unilateral primary osteoarthritis, left knee: Secondary | ICD-10-CM | POA: Diagnosis not present

## 2022-08-14 DIAGNOSIS — S638X2A Sprain of other part of left wrist and hand, initial encounter: Secondary | ICD-10-CM | POA: Diagnosis not present

## 2022-08-14 DIAGNOSIS — M25562 Pain in left knee: Secondary | ICD-10-CM | POA: Diagnosis not present

## 2022-08-15 ENCOUNTER — Encounter: Payer: Self-pay | Admitting: Internal Medicine

## 2022-08-15 ENCOUNTER — Ambulatory Visit: Payer: Medicare Other | Attending: Internal Medicine | Admitting: Internal Medicine

## 2022-08-15 VITALS — BP 100/54 | HR 63 | Ht 60.0 in | Wt 128.0 lb

## 2022-08-15 DIAGNOSIS — Z950 Presence of cardiac pacemaker: Secondary | ICD-10-CM | POA: Insufficient documentation

## 2022-08-15 DIAGNOSIS — I5032 Chronic diastolic (congestive) heart failure: Secondary | ICD-10-CM | POA: Insufficient documentation

## 2022-08-15 DIAGNOSIS — I4821 Permanent atrial fibrillation: Secondary | ICD-10-CM | POA: Diagnosis not present

## 2022-08-15 LAB — CUP PACEART INCLINIC DEVICE CHECK
Battery Impedance: 1059 Ohm
Battery Remaining Longevity: 65 mo
Battery Voltage: 2.78 V
Brady Statistic RV Percent Paced: 96 %
Date Time Interrogation Session: 20240509194944
Implantable Lead Connection Status: 753985
Implantable Lead Connection Status: 753985
Implantable Lead Implant Date: 20090318
Implantable Lead Implant Date: 20090318
Implantable Lead Location: 753859
Implantable Lead Location: 753860
Implantable Lead Model: 5076
Implantable Lead Model: 5092
Implantable Pulse Generator Implant Date: 20160202
Lead Channel Impedance Value: 67 Ohm
Lead Channel Impedance Value: 745 Ohm
Lead Channel Pacing Threshold Amplitude: 1 V
Lead Channel Pacing Threshold Amplitude: 1 V
Lead Channel Pacing Threshold Pulse Width: 0.4 ms
Lead Channel Pacing Threshold Pulse Width: 0.4 ms
Lead Channel Setting Pacing Amplitude: 2.5 V
Lead Channel Setting Pacing Pulse Width: 0.4 ms
Lead Channel Setting Sensing Sensitivity: 4 mV
Zone Setting Status: 755011
Zone Setting Status: 755011

## 2022-08-15 NOTE — Patient Instructions (Signed)
Medication Instructions:  Your physician recommends that you continue on your current medications as directed. Please refer to the Current Medication list given to you today.  *If you need a refill on your cardiac medications before your next appointment, please call your pharmacy*  Lab Work: None ordered.  If you have labs (blood work) drawn today and your tests are completely normal, you will receive your results only by: MyChart Message (if you have MyChart) OR A paper copy in the mail If you have any lab test that is abnormal or we need to change your treatment, we will call you to review the results.  Testing/Procedures: None ordered.  Follow-Up: At [facility], you and your health needs are our priority.  As part of our continuing mission to provide you with exceptional heart care, we have created designated Provider Care Teams.  These Care Teams include your primary Cardiologist (physician) and Advanced Practice Providers (APPs -  Physician Assistants and Nurse Practitioners) who all work together to provide you with the care you need, when you need it.  We recommend signing up for the patient portal called "MyChart".  Sign up information is provided on this After Visit Summary.  MyChart is used to connect with patients for Virtual Visits (Telemedicine).  Patients are able to view lab/test results, encounter notes, upcoming appointments, etc.  Non-urgent messages can be sent to your provider as well.   To learn more about what you can do with MyChart, go to [email or web address].    Your next appointment:   1 year(s)  The format for your next appointment:   In Person  Provider:   Gregg Taylor, MD{or one of the following Advanced Practice Providers on your designated Care Team:   Renee Ursuy, PA-C Michael "Andy" Tillery, PA-C  Remote monitoring is used to monitor your Pacemaker from home. This monitoring reduces the number of office visits required to check your device to  one time per year. It allows us to keep an eye on the functioning of your device to ensure it is working properly. You are scheduled for a device check from home. You may send your transmission at any time that day. If you have a wireless device, the transmission will be sent automatically. After your physician reviews your transmission, you will receive a postcard with your next transmission date.        

## 2022-08-15 NOTE — Progress Notes (Signed)
HPI  Jody Blake is referred today for ongoing evaluation of CHB and atrial fib. The patient has a h/o HTN and diastolic heart failure which is improved after AV node ablation. The patient denies bleeding, peripheral edema. Her dyspnea is class 2. She has done well in the interim.   Current Outpatient Medications  Medication Sig Dispense Refill   albuterol (VENTOLIN HFA) 108 (90 Base) MCG/ACT inhaler Inhale 2 puffs into the lungs every 6 (six) hours as needed for wheezing or shortness of breath.     Azelastine HCl 137 MCG/SPRAY SOLN USE 1 TO 2 SPRAYS INTO BOTH NOSTRILS TWICE DAILY. 30 mL 11   Calcium 600-10 MG-MCG CHEW Chew 1 capsule by mouth daily. 30 tablet 5   Cholecalciferol (VITAMIN D) 125 MCG (5000 UT) CAPS Take 1 capsule by mouth daily. 30 capsule 11   dorzolamide (TRUSOPT) 2 % ophthalmic solution Place 1 drop into both eyes 2 (two) times daily.     famotidine (PEPCID) 40 MG tablet Take 1 tablet (40 mg total) by mouth at bedtime. 30 tablet 11   fexofenadine (ALLEGRA) 180 MG tablet Take 180 mg by mouth daily.     fluticasone (FLONASE) 50 MCG/ACT nasal spray USE 2 SPRAYS EACH NOSTRIL ONCE A DAY AS NEEDED FOR ALLERGIES OR CONGESTION. 16 g PRN   gabapentin (NEURONTIN) 100 MG capsule Take 1 capsule (100 mg total) by mouth at bedtime. 30 capsule 11   ipratropium-albuterol (DUONEB) 0.5-2.5 (3) MG/3ML SOLN Take 3 mLs by nebulization 3 (three) times daily as needed.     ipratropium-albuterol (DUONEB) 0.5-2.5 (3) MG/3ML SOLN Take 3 mLs by nebulization at bedtime. 360 mL 0   latanoprost (XALATAN) 0.005 % ophthalmic solution Place 1 drop into both eyes at bedtime.     levothyroxine (SYNTHROID) 75 MCG tablet Take 1 tablet (75 mcg total) by mouth daily. 30 tablet 5   losartan (COZAAR) 50 MG tablet Take 1 tablet (50 mg total) by mouth daily. 30 tablet 5   Lutein 20 MG CAPS Take 1 capsule (20 mg total) by mouth daily. 30 capsule 11   Magnesium 500 MG TABS Take 750 mg by mouth See admin  instructions. Take 750 mg by mouth every evening (in conjunction with 3 BioComplete capsules)     metoprolol tartrate (LOPRESSOR) 50 MG tablet Take 1.5 tablets (75 mg total) by mouth in the morning. 45 tablet 11   mometasone-formoterol (DULERA) 100-5 MCG/ACT AERO Inhale 2 puffs then rinse mouth, twice daily 1 each 12   montelukast (SINGULAIR) 10 MG tablet Take 1 tablet (10 mg total) by mouth daily. 30 tablet 11   Multiple Vitamins-Minerals (PRESERVISION AREDS 2) CAPS Take 1 capsule by mouth in the morning and at bedtime. 56 capsule 11   NON FORMULARY Take 3 capsules by mouth See admin instructions. Gundry MD Bio Complete 3 - Prebiotic, Probiotic, Postbiotic to Support Optimal Gut Health capsules- TAKE 3 CAPSULES BY MOUTH EVERY EVENING (in conjunction with 750 mg Magnesium)     potassium chloride (KLOR-CON) 10 MEQ tablet Take 2 tablets (20 mEq total) by mouth daily. 60 tablet 5   predniSONE (DELTASONE) 2.5 MG tablet Take 1 tablet (2.5 mg total) by mouth daily with breakfast. 30 tablet 5   rivaroxaban (XARELTO) 20 MG TABS tablet Take 1 tablet (20 mg total) by mouth daily with supper. 90 tablet 1   torsemide (DEMADEX) 20 MG tablet Take 1 tablet (20 mg total) by mouth daily as needed. 3 lb weight gain in  1 day or 5 lb in 1 week or excessive edema 30 tablet 3   torsemide (DEMADEX) 20 MG tablet Take 1.5 tablets (30 mg total) by mouth daily. 45 tablet 0   TYLENOL 500 MG tablet Take 1,000 mg by mouth in the morning, at noon, and at bedtime.     No current facility-administered medications for this visit.     Past Medical History:  Diagnosis Date   Anemia    Aortic stenosis    a. Echo 09/06/12 EF 55-60%, no WMA, G2DD, Ao valve sclerosis w/ mod stenosis, LA mildly dilated, PA pressure   Asthmatic bronchitis    Complete heart block (HCC)    s/p permanent pacemaker 06/27/1999 (Battery change 06/2007 and 2016).  s/p AV nodal ablation by Dr Johney Frame 2016.   COPD (chronic obstructive pulmonary disease)  (HCC)    Dr. Maple Hudson   DDD (degenerative disc disease)    Diastolic dysfunction    a. Echo 09/06/12 EF 55-60%, no WMA, G2DD, Ao valve sclerosis w/ mod stenosis, LA mildly dilated, PA pressure   Diverticulosis    DVT (deep vein thrombosis) in pregnancy 1954   a. LLE   Hypertension    Hypothyroidism    on medication   Macular degeneration    Dr. Jesusita Oka   Osteoarthrosis, unspecified whether generalized or localized, other specified sites    PAF (paroxysmal atrial fibrillation) (HCC)    Stopped flecainide, on amiodarone but still has bouts of A FIB (mostly in mornings).    Pure hypercholesterolemia    Staghorn calculus    Left    ROS:   All systems reviewed and negative except as noted in the HPI.   Past Surgical History:  Procedure Laterality Date   APPENDECTOMY  ~ 1941   AV NODE ABLATION  05/10/2014   AV NODE ABLATION N/A 05/10/2014   Procedure: AV NODE ABLATION;  Surgeon: Hillis Range, MD;  Location: [facility] CATH LAB;  Service: Cardiovascular;  Laterality: N/A;   BUNIONECTOMY WITH HAMMERTOE RECONSTRUCTION Bilateral ~ 1990   CARDIAC PACEMAKER PLACEMENT  06/27/99   Medtronic PM implanted by Dr Amil Amen   CARDIOVERSION N/A 12/02/2013   Procedure: CARDIOVERSION;  Surgeon: Pricilla Riffle, MD;  Location: [facility] ENDOSCOPY;  Service: Cardiovascular;  Laterality: N/A;   CATARACT EXTRACTION W/ INTRAOCULAR LENS  IMPLANT, BILATERAL Bilateral    COLONOSCOPY WITH PROPOFOL N/A 04/22/2013   Procedure: COLONOSCOPY WITH PROPOFOL;  Surgeon: Charolett Bumpers, MD;  Location: [facility] ENDOSCOPY;  Service: Endoscopy;  Laterality: N/A;   DILATION AND CURETTAGE OF UTERUS  X 2   "when I was going thru menopause"   ESOPHAGOGASTRODUODENOSCOPY (EGD) WITH PROPOFOL N/A 04/22/2013   Procedure: ESOPHAGOGASTRODUODENOSCOPY (EGD) WITH PROPOFOL;  Surgeon: Charolett Bumpers, MD;  Location: [facility] ENDOSCOPY;  Service: Endoscopy;  Laterality: N/A;   HERNIA REPAIR     INCISIONAL HERNIA REPAIR     INSERT / REPLACE / REMOVE PACEMAKER  06/2007    "took out the old; put in new"   INSERT / REPLACE / REMOVE PACEMAKER  05/10/2014   MDT PPM generator change by Dr Johney Frame   JOINT REPLACEMENT     PARTIAL NEPHRECTOMY Left 05/1974   stone disease   PERMANENT PACEMAKER GENERATOR CHANGE N/A 05/10/2014   Procedure: PERMANENT PACEMAKER GENERATOR CHANGE;  Surgeon: Hillis Range, MD;  Location: [facility] CATH LAB;  Service: Cardiovascular;  Laterality: N/A;   TONSILLECTOMY AND ADENOIDECTOMY  1930's   TOTAL KNEE ARTHROPLASTY Right 2001     Family History  Problem  Relation Age of Onset   Malignant hypertension Father    Hypertension Father    Renal Disease Father    Breast cancer Mother    Heart attack Brother    Stroke Brother      Social History   Socioeconomic History   Marital status: Widowed    Spouse name: Not on file   Number of children: 2   Years of education: Not on file   Highest education level: Not on file  Occupational History   Occupation: RETIRED    Employer: RETIRED    Comment: Family [employer] business  Tobacco Use   Smoking status: Never   Smokeless tobacco: Never  Vaping Use   Vaping Use: Never used  Substance and Sexual Activity   Alcohol use: Yes    Alcohol/week: 7.0 standard drinks of alcohol    Types: 7 Glasses of wine per week    Comment: occasionally   Drug use: No   Sexual activity: Not Currently    Partners: Male  Other Topics Concern   Not on file  Social History Narrative   Diet? Low salt, low fat      Do you drink/eat things with caffeine? yes      Marital status?                 married                   What year were you married? 1949      Do you live in a house, apartment, assisted living, condo, trailer, etc.? apartment      Is it one or more stories? 3 stories      How many persons live in your home? Just me      Do you have any pets in your home? (please list) no      Current or past profession: accounting      Do you exercise?          yes                            Type & how often?  Walk, class, prescribed daily      Do you have a living will? yes      Do you have a DNR form?     yes                             If not, do you want to discuss one?      Do you have signed POA/HPOA for forms? no         Social Determinants of Health   Financial Resource Strain: Low Risk  (02/16/2021)   Overall Financial Resource Strain (CARDIA)    Difficulty of Paying Living Expenses: Not hard at all  Food Insecurity: No Food Insecurity (04/08/2022)   Hunger Vital Sign    Worried About Running Out of Food in the Last Year: Never true    Ran Out of Food in the Last Year: Never true  Transportation Needs: No Transportation Needs (04/08/2022)   PRAPARE - Administrator, Civil Service (Medical): No    Lack of Transportation (Non-Medical): No  Physical Activity: Sufficiently Active (02/16/2021)   Exercise Vital Sign    Days of Exercise per Week: 7 days    Minutes of Exercise per Session: 30 min  Stress: No Stress Concern  Present (02/16/2021)   [employer] of Occupational Health - Occupational Stress Questionnaire    Feeling of Stress : Not at all  Social Connections: Moderately Isolated (02/16/2021)   Social Connection and Isolation Panel [NHANES]    Frequency of Communication with Friends and Family: More than three times a week    Frequency of Social Gatherings with Friends and Family: More than three times a week    Attends Religious Services: Never    Database administrator or Organizations: Yes    Attends Engineer, structural: More than 4 times per year    Marital Status: Widowed  Intimate Partner Violence: Not At Risk (04/08/2022)   Humiliation, Afraid, Rape, and Kick questionnaire    Fear of Current or Ex-Partner: No    Emotionally Abused: No    Physically Abused: No    Sexually Abused: No     BP (!) 100/54   Pulse 63   Ht 5' (1.524 m)   Wt 128 lb (58.1 kg)   LMP 04/08/1974   SpO2 99%   BMI 25.00 kg/m   Physical Exam:  Well appearing  NAD HEENT: Unremarkable Neck:  No JVD, no thyromegally Lymphatics:  No adenopathy Back:  No CVA tenderness Lungs:  Clear HEART:  Regular rate rhythm, no murmurs, no rubs, no clicks Abd:  soft, positive bowel sounds, no organomegally, no rebound, no guarding Ext:  2 plus pulses, no edema, no cyanosis, no clubbing Skin:  No rashes no nodules Neuro:  CN II through XII intact, motor grossly intact  EKG - atrial fib with ventricular pacing  DEVICE  Normal device function.  See PaceArt for details.   Assess/Plan:  Atrial fib - her VR is well controlled.  Coags - she will continue on xarelto. Diastolic heart failure - her symptoms are class 2 and we will make no changes in her medical therapy. PPM - her medtronic PPM is programmed VVIR at 60/min. We will follow and recheck in several months.   Sharlot Gowda Cullan Launer,MD

## 2022-08-16 DIAGNOSIS — Z9181 History of falling: Secondary | ICD-10-CM | POA: Diagnosis not present

## 2022-08-16 DIAGNOSIS — M25562 Pain in left knee: Secondary | ICD-10-CM | POA: Diagnosis not present

## 2022-08-16 DIAGNOSIS — S638X2A Sprain of other part of left wrist and hand, initial encounter: Secondary | ICD-10-CM | POA: Diagnosis not present

## 2022-08-16 DIAGNOSIS — M1712 Unilateral primary osteoarthritis, left knee: Secondary | ICD-10-CM | POA: Diagnosis not present

## 2022-08-16 DIAGNOSIS — R278 Other lack of coordination: Secondary | ICD-10-CM | POA: Diagnosis not present

## 2022-08-16 DIAGNOSIS — M25532 Pain in left wrist: Secondary | ICD-10-CM | POA: Diagnosis not present

## 2022-08-19 DIAGNOSIS — S638X2A Sprain of other part of left wrist and hand, initial encounter: Secondary | ICD-10-CM | POA: Diagnosis not present

## 2022-08-19 DIAGNOSIS — M25562 Pain in left knee: Secondary | ICD-10-CM | POA: Diagnosis not present

## 2022-08-19 DIAGNOSIS — M25532 Pain in left wrist: Secondary | ICD-10-CM | POA: Diagnosis not present

## 2022-08-19 DIAGNOSIS — R278 Other lack of coordination: Secondary | ICD-10-CM | POA: Diagnosis not present

## 2022-08-19 DIAGNOSIS — Z9181 History of falling: Secondary | ICD-10-CM | POA: Diagnosis not present

## 2022-08-19 DIAGNOSIS — M1712 Unilateral primary osteoarthritis, left knee: Secondary | ICD-10-CM | POA: Diagnosis not present

## 2022-08-21 DIAGNOSIS — M25562 Pain in left knee: Secondary | ICD-10-CM | POA: Diagnosis not present

## 2022-08-21 DIAGNOSIS — R278 Other lack of coordination: Secondary | ICD-10-CM | POA: Diagnosis not present

## 2022-08-21 DIAGNOSIS — M25532 Pain in left wrist: Secondary | ICD-10-CM | POA: Diagnosis not present

## 2022-08-21 DIAGNOSIS — M1712 Unilateral primary osteoarthritis, left knee: Secondary | ICD-10-CM | POA: Diagnosis not present

## 2022-08-21 DIAGNOSIS — S638X2A Sprain of other part of left wrist and hand, initial encounter: Secondary | ICD-10-CM | POA: Diagnosis not present

## 2022-08-21 DIAGNOSIS — Z9181 History of falling: Secondary | ICD-10-CM | POA: Diagnosis not present

## 2022-08-23 DIAGNOSIS — M25532 Pain in left wrist: Secondary | ICD-10-CM | POA: Diagnosis not present

## 2022-08-23 DIAGNOSIS — Z9181 History of falling: Secondary | ICD-10-CM | POA: Diagnosis not present

## 2022-08-23 DIAGNOSIS — M1712 Unilateral primary osteoarthritis, left knee: Secondary | ICD-10-CM | POA: Diagnosis not present

## 2022-08-23 DIAGNOSIS — M25562 Pain in left knee: Secondary | ICD-10-CM | POA: Diagnosis not present

## 2022-08-23 DIAGNOSIS — R278 Other lack of coordination: Secondary | ICD-10-CM | POA: Diagnosis not present

## 2022-08-23 DIAGNOSIS — S638X2A Sprain of other part of left wrist and hand, initial encounter: Secondary | ICD-10-CM | POA: Diagnosis not present

## 2022-08-26 DIAGNOSIS — M1712 Unilateral primary osteoarthritis, left knee: Secondary | ICD-10-CM | POA: Diagnosis not present

## 2022-08-26 DIAGNOSIS — M25532 Pain in left wrist: Secondary | ICD-10-CM | POA: Diagnosis not present

## 2022-08-26 DIAGNOSIS — Z9181 History of falling: Secondary | ICD-10-CM | POA: Diagnosis not present

## 2022-08-26 DIAGNOSIS — R278 Other lack of coordination: Secondary | ICD-10-CM | POA: Diagnosis not present

## 2022-08-26 DIAGNOSIS — S638X2A Sprain of other part of left wrist and hand, initial encounter: Secondary | ICD-10-CM | POA: Diagnosis not present

## 2022-08-26 DIAGNOSIS — M25562 Pain in left knee: Secondary | ICD-10-CM | POA: Diagnosis not present

## 2022-08-27 DIAGNOSIS — Z9181 History of falling: Secondary | ICD-10-CM | POA: Diagnosis not present

## 2022-08-27 DIAGNOSIS — M1712 Unilateral primary osteoarthritis, left knee: Secondary | ICD-10-CM | POA: Diagnosis not present

## 2022-08-27 DIAGNOSIS — R278 Other lack of coordination: Secondary | ICD-10-CM | POA: Diagnosis not present

## 2022-08-27 DIAGNOSIS — S638X2A Sprain of other part of left wrist and hand, initial encounter: Secondary | ICD-10-CM | POA: Diagnosis not present

## 2022-08-27 DIAGNOSIS — M25562 Pain in left knee: Secondary | ICD-10-CM | POA: Diagnosis not present

## 2022-08-27 DIAGNOSIS — M25532 Pain in left wrist: Secondary | ICD-10-CM | POA: Diagnosis not present

## 2022-08-29 DIAGNOSIS — S638X2A Sprain of other part of left wrist and hand, initial encounter: Secondary | ICD-10-CM | POA: Diagnosis not present

## 2022-08-29 DIAGNOSIS — M25532 Pain in left wrist: Secondary | ICD-10-CM | POA: Diagnosis not present

## 2022-08-29 DIAGNOSIS — M1712 Unilateral primary osteoarthritis, left knee: Secondary | ICD-10-CM | POA: Diagnosis not present

## 2022-08-29 DIAGNOSIS — Z9181 History of falling: Secondary | ICD-10-CM | POA: Diagnosis not present

## 2022-08-29 DIAGNOSIS — R278 Other lack of coordination: Secondary | ICD-10-CM | POA: Diagnosis not present

## 2022-08-29 DIAGNOSIS — M25562 Pain in left knee: Secondary | ICD-10-CM | POA: Diagnosis not present

## 2022-09-03 DIAGNOSIS — Z9181 History of falling: Secondary | ICD-10-CM | POA: Diagnosis not present

## 2022-09-03 DIAGNOSIS — M1712 Unilateral primary osteoarthritis, left knee: Secondary | ICD-10-CM | POA: Diagnosis not present

## 2022-09-03 DIAGNOSIS — R278 Other lack of coordination: Secondary | ICD-10-CM | POA: Diagnosis not present

## 2022-09-03 DIAGNOSIS — S638X2A Sprain of other part of left wrist and hand, initial encounter: Secondary | ICD-10-CM | POA: Diagnosis not present

## 2022-09-03 DIAGNOSIS — M25532 Pain in left wrist: Secondary | ICD-10-CM | POA: Diagnosis not present

## 2022-09-03 DIAGNOSIS — M25562 Pain in left knee: Secondary | ICD-10-CM | POA: Diagnosis not present

## 2022-09-05 DIAGNOSIS — M25532 Pain in left wrist: Secondary | ICD-10-CM | POA: Diagnosis not present

## 2022-09-05 DIAGNOSIS — M25562 Pain in left knee: Secondary | ICD-10-CM | POA: Diagnosis not present

## 2022-09-05 DIAGNOSIS — S638X2A Sprain of other part of left wrist and hand, initial encounter: Secondary | ICD-10-CM | POA: Diagnosis not present

## 2022-09-05 DIAGNOSIS — M1712 Unilateral primary osteoarthritis, left knee: Secondary | ICD-10-CM | POA: Diagnosis not present

## 2022-09-05 DIAGNOSIS — Z9181 History of falling: Secondary | ICD-10-CM | POA: Diagnosis not present

## 2022-09-05 DIAGNOSIS — R278 Other lack of coordination: Secondary | ICD-10-CM | POA: Diagnosis not present

## 2022-09-06 DIAGNOSIS — S638X2A Sprain of other part of left wrist and hand, initial encounter: Secondary | ICD-10-CM | POA: Diagnosis not present

## 2022-09-06 DIAGNOSIS — M1712 Unilateral primary osteoarthritis, left knee: Secondary | ICD-10-CM | POA: Diagnosis not present

## 2022-09-06 DIAGNOSIS — M25532 Pain in left wrist: Secondary | ICD-10-CM | POA: Diagnosis not present

## 2022-09-06 DIAGNOSIS — Z9181 History of falling: Secondary | ICD-10-CM | POA: Diagnosis not present

## 2022-09-06 DIAGNOSIS — M25562 Pain in left knee: Secondary | ICD-10-CM | POA: Diagnosis not present

## 2022-09-06 DIAGNOSIS — R278 Other lack of coordination: Secondary | ICD-10-CM | POA: Diagnosis not present

## 2022-09-09 DIAGNOSIS — M6281 Muscle weakness (generalized): Secondary | ICD-10-CM | POA: Diagnosis not present

## 2022-09-09 DIAGNOSIS — R278 Other lack of coordination: Secondary | ICD-10-CM | POA: Diagnosis not present

## 2022-09-09 DIAGNOSIS — S638X2A Sprain of other part of left wrist and hand, initial encounter: Secondary | ICD-10-CM | POA: Diagnosis not present

## 2022-09-09 DIAGNOSIS — M25532 Pain in left wrist: Secondary | ICD-10-CM | POA: Diagnosis not present

## 2022-09-10 ENCOUNTER — Other Ambulatory Visit: Payer: Self-pay | Admitting: Orthopedic Surgery

## 2022-09-10 DIAGNOSIS — R278 Other lack of coordination: Secondary | ICD-10-CM | POA: Diagnosis not present

## 2022-09-10 DIAGNOSIS — S638X2A Sprain of other part of left wrist and hand, initial encounter: Secondary | ICD-10-CM | POA: Diagnosis not present

## 2022-09-10 DIAGNOSIS — M85851 Other specified disorders of bone density and structure, right thigh: Secondary | ICD-10-CM

## 2022-09-10 DIAGNOSIS — M6281 Muscle weakness (generalized): Secondary | ICD-10-CM | POA: Diagnosis not present

## 2022-09-10 DIAGNOSIS — M25532 Pain in left wrist: Secondary | ICD-10-CM | POA: Diagnosis not present

## 2022-09-10 MED ORDER — CALCIUM CARB-CHOLECALCIFEROL 500-10 MG-MCG PO CHEW
1.0000 | CHEWABLE_TABLET | Freq: Every day | ORAL | 5 refills | Status: DC
Start: 2022-09-10 — End: 2023-04-14

## 2022-09-12 DIAGNOSIS — M25532 Pain in left wrist: Secondary | ICD-10-CM | POA: Diagnosis not present

## 2022-09-12 DIAGNOSIS — M6281 Muscle weakness (generalized): Secondary | ICD-10-CM | POA: Diagnosis not present

## 2022-09-12 DIAGNOSIS — S638X2A Sprain of other part of left wrist and hand, initial encounter: Secondary | ICD-10-CM | POA: Diagnosis not present

## 2022-09-12 DIAGNOSIS — R278 Other lack of coordination: Secondary | ICD-10-CM | POA: Diagnosis not present

## 2022-09-16 ENCOUNTER — Ambulatory Visit (INDEPENDENT_AMBULATORY_CARE_PROVIDER_SITE_OTHER): Payer: Medicare Other

## 2022-09-16 DIAGNOSIS — M6281 Muscle weakness (generalized): Secondary | ICD-10-CM | POA: Diagnosis not present

## 2022-09-16 DIAGNOSIS — S638X2A Sprain of other part of left wrist and hand, initial encounter: Secondary | ICD-10-CM | POA: Diagnosis not present

## 2022-09-16 DIAGNOSIS — Z950 Presence of cardiac pacemaker: Secondary | ICD-10-CM | POA: Diagnosis not present

## 2022-09-16 DIAGNOSIS — M25532 Pain in left wrist: Secondary | ICD-10-CM | POA: Diagnosis not present

## 2022-09-16 DIAGNOSIS — R278 Other lack of coordination: Secondary | ICD-10-CM | POA: Diagnosis not present

## 2022-09-17 DIAGNOSIS — M25532 Pain in left wrist: Secondary | ICD-10-CM | POA: Diagnosis not present

## 2022-09-17 DIAGNOSIS — R278 Other lack of coordination: Secondary | ICD-10-CM | POA: Diagnosis not present

## 2022-09-17 DIAGNOSIS — M6281 Muscle weakness (generalized): Secondary | ICD-10-CM | POA: Diagnosis not present

## 2022-09-17 DIAGNOSIS — S638X2A Sprain of other part of left wrist and hand, initial encounter: Secondary | ICD-10-CM | POA: Diagnosis not present

## 2022-09-17 LAB — CUP PACEART REMOTE DEVICE CHECK
Battery Impedance: 1113 Ohm
Battery Remaining Longevity: 65 mo
Battery Voltage: 2.78 V
Brady Statistic RV Percent Paced: 100 %
Date Time Interrogation Session: 20240610091735
Implantable Lead Connection Status: 753985
Implantable Lead Connection Status: 753985
Implantable Lead Implant Date: 20090318
Implantable Lead Implant Date: 20090318
Implantable Lead Location: 753859
Implantable Lead Location: 753860
Implantable Lead Model: 5076
Implantable Lead Model: 5092
Implantable Pulse Generator Implant Date: 20160202
Lead Channel Impedance Value: 67 Ohm
Lead Channel Impedance Value: 834 Ohm
Lead Channel Pacing Threshold Amplitude: 1.125 V
Lead Channel Pacing Threshold Pulse Width: 0.4 ms
Lead Channel Setting Pacing Amplitude: 2.5 V
Lead Channel Setting Pacing Pulse Width: 0.4 ms
Lead Channel Setting Sensing Sensitivity: 4 mV
Zone Setting Status: 755011
Zone Setting Status: 755011

## 2022-09-19 DIAGNOSIS — M6281 Muscle weakness (generalized): Secondary | ICD-10-CM | POA: Diagnosis not present

## 2022-09-19 DIAGNOSIS — R278 Other lack of coordination: Secondary | ICD-10-CM | POA: Diagnosis not present

## 2022-09-19 DIAGNOSIS — S638X2A Sprain of other part of left wrist and hand, initial encounter: Secondary | ICD-10-CM | POA: Diagnosis not present

## 2022-09-19 DIAGNOSIS — M25532 Pain in left wrist: Secondary | ICD-10-CM | POA: Diagnosis not present

## 2022-09-25 DIAGNOSIS — M6281 Muscle weakness (generalized): Secondary | ICD-10-CM | POA: Diagnosis not present

## 2022-09-25 DIAGNOSIS — M25532 Pain in left wrist: Secondary | ICD-10-CM | POA: Diagnosis not present

## 2022-09-25 DIAGNOSIS — S638X2A Sprain of other part of left wrist and hand, initial encounter: Secondary | ICD-10-CM | POA: Diagnosis not present

## 2022-09-25 DIAGNOSIS — R278 Other lack of coordination: Secondary | ICD-10-CM | POA: Diagnosis not present

## 2022-10-02 DIAGNOSIS — M25532 Pain in left wrist: Secondary | ICD-10-CM | POA: Diagnosis not present

## 2022-10-02 DIAGNOSIS — R278 Other lack of coordination: Secondary | ICD-10-CM | POA: Diagnosis not present

## 2022-10-02 DIAGNOSIS — M6281 Muscle weakness (generalized): Secondary | ICD-10-CM | POA: Diagnosis not present

## 2022-10-02 DIAGNOSIS — S638X2A Sprain of other part of left wrist and hand, initial encounter: Secondary | ICD-10-CM | POA: Diagnosis not present

## 2022-10-03 DIAGNOSIS — M19011 Primary osteoarthritis, right shoulder: Secondary | ICD-10-CM | POA: Diagnosis not present

## 2022-10-09 NOTE — Progress Notes (Signed)
Remote pacemaker transmission.   

## 2022-10-21 ENCOUNTER — Encounter: Payer: Medicare Other | Admitting: Adult Health

## 2022-10-25 NOTE — Progress Notes (Unsigned)
Subjective:    Patient ID: Jody Blake, female    DOB: 1925-10-11, 89 y.o.   MRN: [id number]  HPI F never smoker, followed for allergic rhinitis, chronic bronchitis, complicated by GERD, Hx PAfib/ pacemaker. ------------------------------------------------------------------------------------------------   06/24/22-  89 year old female never smoker followed for Allergic rhinitis, COPD mixed type, complicated by GERD,  AFib/pacemaker/ Xarelto, Glaucoma/ macular degen, dCHF, CHB/ pacemaker, AoStenosis, CKDIII, TIA      Living at [facility] -Singulair, Ventolin hfa, Symbicort 160, Neb Xopenex,  flonase, astelin, prednisone 5 mg daily, Pepcid,Gabapentin 100 qhs, Neb Xopenex,  Covid vax- 4 Moderna                    Caregiver here with her today Flu vax-today senior (( Care message-I just wanted to give you an update about [name]. This past winter [name] has had more exacerbations/infections and this has been treated with increased prednisone and doxycycline. She also just recovered from a bout of Covid. She has been taking either Dulera or Symbicort during this time depending on what insurance would cover. I see she has never smoked and is labeled COPD mixed type. I wanted to just give you a heads up as she has an upcoming f/u apt with you next week that she is having recurrent infections and inquire if the inhaled steroids with chronically 5mg  of prednisone daily is still the recommended regimen for her. Thank you for your help and and I hope you and your wife are doing well!  Thanks  Peggye Ley NP )) -----Has been coughing with clearish phlegm at night since around new years. Has been on prednisone several times, and using nebulizer treatment. Patient had covid about a month ago  Long history of recurrent /chronic bronchitis.  A component of chronic microaspiration has been suspected. She feels fairly well today.  On most days coughs at least a little bit of phlegm, usually clear but sometimes  yellow/green.  Denies fever.  Nebulizer treatment helps used once a day at bedtime.  We reviewed use of rescue inhaler every 6 hours if needed. DVT left leg March 6 with adjustment of Xarelto dose-noted. We discussed ways to reduce her steroid intake, recognizing she almost certainly has adrenal insufficiency now and would not tolerate complete removal. CXR 04/08/22 1V- IMPRESSION: Cardiomegaly with probable retrocardiac atelectasis.  10/28/22-  89 year old female never smoker followed for Allergic rhinitis, COPD mixed type, complicated by GERD,  AFib/pacemaker/ Xarelto, Glaucoma/ macular degen, dCHF, CHB/ pacemaker, AoStenosis, CKDIII, TIA      Living at [facility] -Singulair, Ventolin hfa, Symbicort 160, Neb Xopenex,  flonase, astelin, prednisone 5 mg daily, Pepcid,Gabapentin 100 qhs, Neb Xopenex,   HRCT chest 07/08/22- IMPRESSION: 1. No evidence of interstitial lung disease. 2. No suspicious pulmonary nodules. 3. Tiny left pleural effusion with adjacent subpleural volume loss in the left lower lobe. 4. Aortic atherosclerosis (ICD10-I70.0). Coronary artery calcification. 5. Enlarged pulmonic trunk, indicative of pulmonary arterial hypertension.  ROS-see HPI   + = positive Constitutional:    weight loss, night sweats, fevers, chills, fatigue, lassitude. HEENT:    headaches, difficulty swallowing, tooth/dental problems, sore throat,       sneezing, itching, ear ache, nasal congestion, +post nasal drip, snoring CV:    chest pain, orthopnea, PND, swelling in lower extremities, anasarca,  dizziness, palpitations Resp:   shortness of breath with exertion or at rest.                 +productive cough,   +non-productive cough, coughing up of blood.              change in color of mucus.  +wheezing.   Skin:    rash or lesions. GI: +  heartburn, indigestion, abdominal pain, nausea, vomiting,  GU:  MS:   joint pain, stiffness,  Neuro-     nothing  unusual Psych:  change in mood or affect.  depression or anxiety.   memory loss.  Objective:  OBJ- Physical Exam General- Alert, Oriented, Affect-appropriate, Distress- none acute, + slender Skin- rash-none, lesions- none, excoriation- none Lymphadenopathy- none Head- atraumatic            Eyes- Gross vision intact, PERRLA, conjunctivae and secretions clear            Ears- + Hard of hearing            Nose- Clear, no-Septal dev, mucus, polyps, erosion, perforation             Throat- Mallampati II , mucosa clear , drainage- none, tonsils- atrophic Neck- flexible , trachea midline, no stridor , thyroid nl, carotid no bruit Chest - symmetrical excursion , unlabored           Heart/CV- RRR , no murmur , no gallop  , no rub, nl s1 s2                           - JVD- none , edema+ elastic hose, stasis changes- none, varices- none           Lung-  +few rhonchi, Wheeze -nonel, cough- , dullness -none, rub- none           Chest wall-+ left pacemaker.  Abd-   Br/ Gen/ Rectal- Not done, not indicated Extrem- +rolling walker Neuro- grossly intact to observation    Assessment & Plan:

## 2022-10-28 ENCOUNTER — Encounter: Payer: Self-pay | Admitting: Internal Medicine

## 2022-10-28 ENCOUNTER — Ambulatory Visit (INDEPENDENT_AMBULATORY_CARE_PROVIDER_SITE_OTHER): Payer: Medicare Other | Admitting: Internal Medicine

## 2022-10-28 VITALS — BP 118/64 | HR 65 | Temp 98.3°F | Ht 63.0 in | Wt 134.4 lb

## 2022-10-28 DIAGNOSIS — I5031 Acute diastolic (congestive) heart failure: Secondary | ICD-10-CM | POA: Diagnosis not present

## 2022-10-28 DIAGNOSIS — J441 Chronic obstructive pulmonary disease with (acute) exacerbation: Secondary | ICD-10-CM | POA: Diagnosis not present

## 2022-10-28 NOTE — Assessment & Plan Note (Signed)
Has followed with cardiology Not in overt CHF at this visit

## 2022-10-28 NOTE — Patient Instructions (Signed)
I think you are doing well.  It is ok to use the nebulizer machine up to 4 times daily, if needed  I think we should leave your medicines as they are.

## 2022-10-28 NOTE — Assessment & Plan Note (Signed)
Watching for recurrent exacerbation now. Suspect recurrent microaspiration.  Plan- no changes for now

## 2022-11-03 NOTE — Progress Notes (Unsigned)
Location:  [facility]  POS: Clinic  Provider: Fletcher Anon, ANP  Code Status: DNR Goals of Care:     11/04/2022    1:32 PM  Advanced Directives  Does Patient Have a Medical Advance Directive? Yes  Type of Estate agent of Justice;Out of facility DNR (pink MOST or yellow form);Living will  Does patient want to make changes to medical advance directive? No - Patient declined  Copy of Healthcare Power of Attorney in Chart? Yes - validated most recent copy scanned in chart (See row information)     Chief Complaint  Patient presents with   Medical Management of Chronic Issues    Patient is being seen for a 2 month follow up   Immunizations    Patient is due for covid 19 vaccine for 2024     HPI: Patient is a 89 y.o. female seen today for medical management of chronic diseases.    [place] care workers to support her due to vision loss.   PMH significant for pacemaker due to SSS, afib, HTN, chronic diastolic CHF, COPD, chronic bronchitis, GERD, OA, CKD, HOH, glaucoma, macular degeneration, blindness, constipation.   Chronic bronchitis/COPD: prednisone and steroid inhaler reduced due to recurrent infections. Seen in f/u by Dr Maple Hudson with pulmonary 10/28/22 with no further changes. Concern in note for microaspiration. She reports using the Duoneb at night. Has cough that worsens in bed with sputum production which is chronic. No sob. No low 02 sats.  Uses nebulizer each night. Bed is on two blocks and uses wedge pillow.   Had one bp a week ago of 183/98 which resolved. No symptoms Other bps at home 90-110 systolic  Weight fluctuates 130-132. Does take prn lasix.   Left knee continues to buckle, now she is now walking as much. Her knee has some soreness but not significant pain. Currently using a brace. Using a WC for a long distances. She does at home exercises recommended by PT. She had two knee injections by ortho. She thinks this helps.   Continues with home care 24/7.  Reports her vision is worsening   Past Medical History:  Diagnosis Date   Anemia    Aortic stenosis    a. Echo 09/06/12 EF 55-60%, no WMA, G2DD, Ao valve sclerosis w/ mod stenosis, LA mildly dilated, PA pressure   Asthmatic bronchitis    Complete heart block (HCC)    s/p permanent pacemaker 06/27/1999 (Battery change 06/2007 and 2016).  s/p AV nodal ablation by Dr Johney Frame 2016.   COPD (chronic obstructive pulmonary disease) (HCC)    Dr. Maple Hudson   DDD (degenerative disc disease)    Diastolic dysfunction    a. Echo 09/06/12 EF 55-60%, no WMA, G2DD, Ao valve sclerosis w/ mod stenosis, LA mildly dilated, PA pressure   Diverticulosis    DVT (deep vein thrombosis) in pregnancy 1954   a. LLE   Hypertension    Hypothyroidism    on medication   Macular degeneration    Dr. Jesusita Oka   Osteoarthrosis, unspecified whether generalized or localized, other specified sites    PAF (paroxysmal atrial fibrillation) (HCC)    Stopped flecainide, on amiodarone but still has bouts of A FIB (mostly in mornings).    Pure hypercholesterolemia    Staghorn calculus    Left    Past Surgical History:  Procedure Laterality Date   APPENDECTOMY  ~ 1941   AV NODE ABLATION  05/10/2014   AV NODE ABLATION  N/A 05/10/2014   Procedure: AV NODE ABLATION;  Surgeon: Hillis Range, MD;  Location: [facility] CATH LAB;  Service: Cardiovascular;  Laterality: N/A;   BUNIONECTOMY WITH HAMMERTOE RECONSTRUCTION Bilateral ~ 1990   CARDIAC PACEMAKER PLACEMENT  06/27/99   Medtronic PM implanted by Dr Amil Amen   CARDIOVERSION N/A 12/02/2013   Procedure: CARDIOVERSION;  Surgeon: Pricilla Riffle, MD;  Location: [facility] ENDOSCOPY;  Service: Cardiovascular;  Laterality: N/A;   CATARACT EXTRACTION W/ INTRAOCULAR LENS  IMPLANT, BILATERAL Bilateral    COLONOSCOPY WITH PROPOFOL N/A 04/22/2013   Procedure: COLONOSCOPY WITH PROPOFOL;  Surgeon: Charolett Bumpers, MD;  Location: [facility] ENDOSCOPY;  Service: Endoscopy;  Laterality:  N/A;   DILATION AND CURETTAGE OF UTERUS  X 2   "when I was going thru menopause"   ESOPHAGOGASTRODUODENOSCOPY (EGD) WITH PROPOFOL N/A 04/22/2013   Procedure: ESOPHAGOGASTRODUODENOSCOPY (EGD) WITH PROPOFOL;  Surgeon: Charolett Bumpers, MD;  Location: [facility] ENDOSCOPY;  Service: Endoscopy;  Laterality: N/A;   HERNIA REPAIR     INCISIONAL HERNIA REPAIR     INSERT / REPLACE / REMOVE PACEMAKER  06/2007   "took out the old; put in new"   INSERT / REPLACE / REMOVE PACEMAKER  05/10/2014   MDT PPM generator change by Dr Johney Frame   JOINT REPLACEMENT     PARTIAL NEPHRECTOMY Left 05/1974   stone disease   PERMANENT PACEMAKER GENERATOR CHANGE N/A 05/10/2014   Procedure: PERMANENT PACEMAKER GENERATOR CHANGE;  Surgeon: Hillis Range, MD;  Location: [facility] CATH LAB;  Service: Cardiovascular;  Laterality: N/A;   TONSILLECTOMY AND ADENOIDECTOMY  1930's   TOTAL KNEE ARTHROPLASTY Right 2001    Allergies  Allergen Reactions   Combigan [Brimonidine Tartrate-Timolol] Other (See Comments)    Systemic malaise amigen eye drop   Tape Other (See Comments)    SKIN IS VERY FRAGILE!!   Penicillins Hives   Breo Ellipta [Fluticasone Furoate-Vilanterol] Other (See Comments) and Hypertension    Increased blood pressure   Fluticasone    Other     SEASONAL ALLERGIES   Timolol    Vancomycin Other (See Comments)    Red man syndrome - Mild redness and discomfort. Able to complete initial dose.     Outpatient Encounter Medications as of 11/04/2022  Medication Sig   albuterol (VENTOLIN HFA) 108 (90 Base) MCG/ACT inhaler Inhale 2 puffs into the lungs every 6 (six) hours as needed for wheezing or shortness of breath.   Azelastine HCl 137 MCG/SPRAY SOLN USE 1 TO 2 SPRAYS INTO BOTH NOSTRILS TWICE DAILY.   Calcium 600-10 MG-MCG CHEW Chew 1 capsule by mouth daily.   Calcium Carb-Cholecalciferol (CALCIUM 500/D) 500-10 MG-MCG CHEW Chew 1 tablet by mouth daily.   Cholecalciferol (VITAMIN D) 125 MCG (5000 UT) CAPS Take 1 capsule by mouth daily.    dorzolamide (TRUSOPT) 2 % ophthalmic solution Place 1 drop into both eyes 2 (two) times daily.   famotidine (PEPCID) 40 MG tablet Take 1 tablet (40 mg total) by mouth at bedtime.   fexofenadine (ALLEGRA) 180 MG tablet Take 180 mg by mouth daily.   fluticasone (FLONASE) 50 MCG/ACT nasal spray USE 2 SPRAYS EACH NOSTRIL ONCE A DAY AS NEEDED FOR ALLERGIES OR CONGESTION.   gabapentin (NEURONTIN) 100 MG capsule Take 1 capsule (100 mg total) by mouth at bedtime.   ipratropium-albuterol (DUONEB) 0.5-2.5 (3) MG/3ML SOLN Take 3 mLs by nebulization 3 (three) times daily as needed.   ipratropium-albuterol (DUONEB) 0.5-2.5 (3) MG/3ML SOLN Take 3 mLs by nebulization at bedtime.   latanoprost (XALATAN) 0.005 %  ophthalmic solution Place 1 drop into both eyes at bedtime.   levothyroxine (SYNTHROID) 75 MCG tablet Take 1 tablet (75 mcg total) by mouth daily.   losartan (COZAAR) 50 MG tablet Take 1 tablet (50 mg total) by mouth daily.   Lutein 20 MG CAPS Take 1 capsule (20 mg total) by mouth daily.   Magnesium 500 MG TABS Take 750 mg by mouth See admin instructions. Take 750 mg by mouth every evening (in conjunction with 3 BioComplete capsules)   metoprolol tartrate (LOPRESSOR) 50 MG tablet Take 1.5 tablets (75 mg total) by mouth in the morning.   mometasone-formoterol (DULERA) 100-5 MCG/ACT AERO Inhale 2 puffs then rinse mouth, twice daily   montelukast (SINGULAIR) 10 MG tablet Take 1 tablet (10 mg total) by mouth daily.   Multiple Vitamins-Minerals (PRESERVISION AREDS 2) CAPS Take 1 capsule by mouth in the morning and at bedtime.   NON FORMULARY Take 3 capsules by mouth See admin instructions. Gundry MD Bio Complete 3 - Prebiotic, Probiotic, Postbiotic to Support Optimal Gut Health capsules- TAKE 3 CAPSULES BY MOUTH EVERY EVENING (in conjunction with 750 mg Magnesium)   potassium chloride (KLOR-CON) 10 MEQ tablet Take 2 tablets (20 mEq total) by mouth daily.   predniSONE (DELTASONE) 2.5 MG tablet Take 1 tablet  (2.5 mg total) by mouth daily with breakfast.   rivaroxaban (XARELTO) 20 MG TABS tablet Take 1 tablet (20 mg total) by mouth daily with supper.   torsemide (DEMADEX) 20 MG tablet Take 1 tablet (20 mg total) by mouth daily as needed. 3 lb weight gain in 1 day or 5 lb in 1 week or excessive edema   torsemide (DEMADEX) 20 MG tablet Take 1.5 tablets (30 mg total) by mouth daily.   TYLENOL 500 MG tablet Take 1,000 mg by mouth in the morning, at noon, and at bedtime.   No facility-administered encounter medications on file as of 11/04/2022.    Review of Systems:  Review of Systems  Constitutional:  Negative for activity change, appetite change, chills, diaphoresis, fatigue, fever and unexpected weight change.  HENT:  Negative for congestion.   Eyes:        Vision loss  Respiratory:  Positive for cough. Negative for shortness of breath and wheezing.   Cardiovascular:  Positive for leg swelling. Negative for chest pain and palpitations.  Gastrointestinal:  Negative for abdominal distention, abdominal pain, constipation and diarrhea.  Genitourinary:  Negative for difficulty urinating and dysuria.  Musculoskeletal:  Positive for arthralgias (left knee) and gait problem. Negative for back pain, joint swelling and myalgias.  Neurological:  Negative for dizziness, tremors, seizures, syncope, facial asymmetry, speech difficulty, weakness, light-headedness, numbness and headaches.  Psychiatric/Behavioral:  Negative for agitation, behavioral problems and confusion.     Health Maintenance  Topic Date Due   COVID-19 Vaccine (7 - 2023-24 season) 05/31/2022   INFLUENZA VACCINE  11/07/2022   Medicare Annual Wellness (AWV)  03/19/2023   DTaP/Tdap/Td (2 - Td or Tdap) 02/23/2029   Pneumonia Vaccine 72+ Years old  Completed   Zoster Vaccines- Shingrix  Completed   HPV VACCINES  Aged Out   DEXA SCAN  Discontinued    Physical Exam: Vitals:   11/04/22 1329  BP: 126/66  Pulse: 77  Resp: 17  Temp: 97.8 F  (36.6 C)  TempSrc: Temporal  SpO2: 95%  Weight: 132 lb (59.9 kg)  Height: 5\' 3"  (1.6 m)   Body mass index is 23.38 kg/m. Physical Exam Vitals and nursing note reviewed.  Constitutional:  General: She is not in acute distress.    Appearance: She is not diaphoretic.  HENT:     Head: Normocephalic and atraumatic.     Nose: Nose normal.     Mouth/Throat:     Mouth: Mucous membranes are moist.     Pharynx: Oropharynx is clear.  Neck:     Vascular: No JVD.  Cardiovascular:     Rate and Rhythm: Normal rate and regular rhythm.     Heart sounds: No murmur heard. Pulmonary:     Effort: Pulmonary effort is normal. No respiratory distress.     Breath sounds: Normal breath sounds. No wheezing.  Abdominal:     General: Bowel sounds are normal. There is no distension.     Palpations: Abdomen is soft.     Tenderness: There is no abdominal tenderness.  Musculoskeletal:        General: No swelling, tenderness or deformity.     Comments: Trace edema to both ankles.   Skin:    General: Skin is warm and dry.  Neurological:     Mental Status: She is alert and oriented to person, place, and time.  Psychiatric:        Mood and Affect: Mood normal.     Labs reviewed: Basic Metabolic Panel: Recent Labs    11/15/21 1531 11/29/21 0000 04/08/22 1045 04/09/22 0501 04/15/22 0000 07/09/22 0000  NA 146   < > 139 136 143 141  K 3.8   < > 3.0* 3.6 3.6 3.9  CL 98*   < > 101 101 101 102  CO2 28*   < > 26 24 32* 27*  GLUCOSE  --   --  142* 196*  --   --   BUN 49*   < > 39* 39* 29* 38*  CREATININE 1.5*   < > 1.43* 1.39* 1.2* 1.2*  CALCIUM 10.1   < > 8.9 8.8* 9.5 9.5  TSH 2.57  --   --   --   --   --    < > = values in this interval not displayed.   Liver Function Tests: Recent Labs    03/21/22 0000 04/08/22 1045 07/09/22 0000  AST 34  34 22 20  ALT 37*  37* 15 11  ALKPHOS 53  53 42 67  BILITOT  --  1.4*  --   PROT  --  6.2*  --   ALBUMIN 4.1  4.1 4.0 3.6   No results  for input(s): "LIPASE", "AMYLASE" in the last 8760 hours. No results for input(s): "AMMONIA" in the last 8760 hours. CBC: Recent Labs    04/08/22 1045 04/09/22 0501 04/15/22 0000 07/09/22 0000  WBC 12.8* 9.8 10.8  10.8 8.2  NEUTROABS 8.7*  --  8.00  --   HGB 12.0 11.5* 12.0  12.0 11.4*  HCT 38.4 36.7 37  37 35*  MCV 100.0 100.3*  --   --   PLT 223 217 330  330 294   Lipid Panel: No results for input(s): "CHOL", "HDL", "LDLCALC", "TRIG", "CHOLHDL", "LDLDIRECT" in the last 8760 hours. Lab Results  Component Value Date   HGBA1C 5.7 (H) 06/25/2017    Procedures since last visit: No results found.  Assessment/Plan  1. Stage 3a chronic kidney disease (HCC) Continue to periodically monitor BMP and avoid nephrotoxic agents  2. Essential hypertension Controlled Continue losartan, lopressor, and torsemide  3. Acquired hypothyroidism Continue synthroid Check TSH prior to next apt  4. COPD mixed type (HCC)  Followed by pulmonary Doing well on lower dose prednisone and Dulera Can use albuterol MDI for sob/or cough. If more severe symptoms would recommend nebulizer.   5. Chronic diastolic congestive heart failure (HCC) Appears compensated at this time Continue torsemide at 30 mg every day  Weigh daily Followed by cardiology Stable weight 130-132 lbs.   6. Primary osteoarthritis of left knee Continues to received gel injections.  Knee buckles, use knee brace at all times.   7. Primary open angle glaucoma of both eyes, mild stage VIsion worsening over time Followed by ophthalmology  8. Permanent atrial fibrillation (HCC) Rate is controlled ON xarelto for CVA risk reduction   9. Sick sinus syndrome [facility]) S/p pacemaker.    Labs/tests ordered:  * No order type specified * Next appt:  2 month f/u with Dr Chales Abrahams   Total time :  time greater than 50% of total time spent doing pt counseling and coordination of care

## 2022-11-04 ENCOUNTER — Non-Acute Institutional Stay: Payer: Medicare Other | Admitting: Adult Health

## 2022-11-04 ENCOUNTER — Encounter: Payer: Self-pay | Admitting: Adult Health

## 2022-11-04 VITALS — BP 126/66 | HR 77 | Temp 97.8°F | Resp 17 | Ht 63.0 in | Wt 132.0 lb

## 2022-11-04 DIAGNOSIS — J449 Chronic obstructive pulmonary disease, unspecified: Secondary | ICD-10-CM | POA: Diagnosis not present

## 2022-11-04 DIAGNOSIS — I4821 Permanent atrial fibrillation: Secondary | ICD-10-CM | POA: Diagnosis not present

## 2022-11-04 DIAGNOSIS — I10 Essential (primary) hypertension: Secondary | ICD-10-CM | POA: Diagnosis not present

## 2022-11-04 DIAGNOSIS — M1712 Unilateral primary osteoarthritis, left knee: Secondary | ICD-10-CM | POA: Diagnosis not present

## 2022-11-04 DIAGNOSIS — I5032 Chronic diastolic (congestive) heart failure: Secondary | ICD-10-CM | POA: Diagnosis not present

## 2022-11-04 DIAGNOSIS — E039 Hypothyroidism, unspecified: Secondary | ICD-10-CM

## 2022-11-04 DIAGNOSIS — I495 Sick sinus syndrome: Secondary | ICD-10-CM | POA: Diagnosis not present

## 2022-11-04 DIAGNOSIS — H401131 Primary open-angle glaucoma, bilateral, mild stage: Secondary | ICD-10-CM | POA: Diagnosis not present

## 2022-11-04 DIAGNOSIS — N1831 Chronic kidney disease, stage 3a: Secondary | ICD-10-CM

## 2022-12-05 ENCOUNTER — Other Ambulatory Visit (HOSPITAL_COMMUNITY): Payer: Self-pay

## 2022-12-16 ENCOUNTER — Ambulatory Visit: Payer: Medicare Other

## 2022-12-16 DIAGNOSIS — Z950 Presence of cardiac pacemaker: Secondary | ICD-10-CM

## 2022-12-16 DIAGNOSIS — I4821 Permanent atrial fibrillation: Secondary | ICD-10-CM

## 2022-12-17 LAB — CUP PACEART REMOTE DEVICE CHECK
Battery Impedance: 1248 Ohm
Battery Remaining Longevity: 60 mo
Battery Voltage: 2.77 V
Brady Statistic RV Percent Paced: 100 %
Date Time Interrogation Session: 20240909140639
Implantable Lead Connection Status: 753985
Implantable Lead Connection Status: 753985
Implantable Lead Implant Date: 20090318
Implantable Lead Implant Date: 20090318
Implantable Lead Location: 753859
Implantable Lead Location: 753860
Implantable Lead Model: 5076
Implantable Lead Model: 5092
Implantable Pulse Generator Implant Date: 20160202
Lead Channel Impedance Value: 67 Ohm
Lead Channel Impedance Value: 853 Ohm
Lead Channel Pacing Threshold Amplitude: 1 V
Lead Channel Pacing Threshold Pulse Width: 0.4 ms
Lead Channel Setting Pacing Amplitude: 2.5 V
Lead Channel Setting Pacing Pulse Width: 0.4 ms
Lead Channel Setting Sensing Sensitivity: 4 mV
Zone Setting Status: 755011
Zone Setting Status: 755011

## 2022-12-31 DIAGNOSIS — I80201 Phlebitis and thrombophlebitis of unspecified deep vessels of right lower extremity: Secondary | ICD-10-CM | POA: Diagnosis not present

## 2022-12-31 DIAGNOSIS — N1831 Chronic kidney disease, stage 3a: Secondary | ICD-10-CM | POA: Diagnosis not present

## 2022-12-31 DIAGNOSIS — E039 Hypothyroidism, unspecified: Secondary | ICD-10-CM | POA: Diagnosis not present

## 2022-12-31 LAB — BASIC METABOLIC PANEL
BUN: 37 — AB (ref 4–21)
CO2: 29 — AB (ref 13–22)
Chloride: 103 (ref 99–108)
Creatinine: 1.3 — AB (ref 0.5–1.1)
Glucose: 101
Potassium: 3.7 mEq/L (ref 3.5–5.1)
Sodium: 143 (ref 137–147)

## 2022-12-31 LAB — TSH: TSH: 2.3 (ref 0.41–5.90)

## 2022-12-31 LAB — COMPREHENSIVE METABOLIC PANEL: Calcium: 10 (ref 8.7–10.7)

## 2023-01-02 NOTE — Progress Notes (Signed)
Remote pacemaker transmission.   

## 2023-01-07 ENCOUNTER — Non-Acute Institutional Stay: Payer: Medicare Other | Admitting: Internal Medicine

## 2023-01-07 ENCOUNTER — Encounter: Payer: Self-pay | Admitting: Internal Medicine

## 2023-01-07 VITALS — BP 118/70 | HR 74 | Temp 97.8°F | Resp 17 | Ht 63.0 in | Wt 137.0 lb

## 2023-01-07 DIAGNOSIS — M1712 Unilateral primary osteoarthritis, left knee: Secondary | ICD-10-CM

## 2023-01-07 DIAGNOSIS — J449 Chronic obstructive pulmonary disease, unspecified: Secondary | ICD-10-CM | POA: Diagnosis not present

## 2023-01-07 DIAGNOSIS — I4821 Permanent atrial fibrillation: Secondary | ICD-10-CM

## 2023-01-07 DIAGNOSIS — I10 Essential (primary) hypertension: Secondary | ICD-10-CM | POA: Diagnosis not present

## 2023-01-07 DIAGNOSIS — N1831 Chronic kidney disease, stage 3a: Secondary | ICD-10-CM

## 2023-01-07 DIAGNOSIS — E039 Hypothyroidism, unspecified: Secondary | ICD-10-CM

## 2023-01-07 DIAGNOSIS — I495 Sick sinus syndrome: Secondary | ICD-10-CM

## 2023-01-07 DIAGNOSIS — I5032 Chronic diastolic (congestive) heart failure: Secondary | ICD-10-CM

## 2023-01-07 NOTE — Progress Notes (Unsigned)
Location:  [facility] features editor of Service:  Clinic (12)  Provider:   Code Status:  Goals of Care:     01/07/2023   10:54 AM  Advanced Directives  Does Patient Have a Medical Advance Directive? Yes  Type of Estate agent of [place];Out of facility DNR (pink MOST or yellow form);Living will  Does patient want to make changes to medical advance directive? No - Patient declined  Copy of Healthcare Power of Attorney in Chart? Yes - validated most recent copy scanned in chart (See row information)     Chief Complaint  Patient presents with   Medical Management of Chronic Issues    Patient is here for a follow up.   Immunizations    Patient states she has questions about immunizations    HPI: Patient is a 89 y.o. female seen today for medical management of chronic diseases.   Lives in [place] with her caregiver Now has 24/7 caregivers   Patient has h/o A. fib on Xarelto  On higher dose of Xarelto due to Post Covid DVT in 06/12/2022  s/p AV nodal ablation, s/p PPM in 2009 Aortic stenosis   COPD with frequent exacerbations.  Not on oxygen.  On chronic prednisone.  Follows closely with Dr. Maple Hudson.  Is enrolled in palliative care  Recent Prednisone dose has been decreased to 2.5 mg Also oN Dulera Uses Nebs 1-2 times a day to help with Cough and SOB Cannot walk much anymore   Chronic diastolic CHF  History of chronic venous insufficiency Vision loss due to macular degeneration and glaucoma Very HOH Osteopenia    Acute DVT in 06/12/2022 post Covid    Left Hand Contusion after fall in 07/08/22 Continues ot have numbness and wants her Neurontin increased Left Knee swelling and pain Has it injected many times with no benefits Does says it does not buckle anymore    Past Medical History:  Diagnosis Date   Anemia    Aortic stenosis    a. Echo 09/06/12 EF 55-60%, no WMA, G2DD, Ao valve sclerosis w/ mod stenosis, LA mildly dilated,  PA pressure   Asthmatic bronchitis    Complete heart block (HCC)    s/p permanent pacemaker 06/27/1999 (Battery change 06/2007 and 2016).  s/p AV nodal ablation by Dr Johney Frame 2016.   COPD (chronic obstructive pulmonary disease) (HCC)    Dr. Maple Hudson   DDD (degenerative disc disease)    Diastolic dysfunction    a. Echo 09/06/12 EF 55-60%, no WMA, G2DD, Ao valve sclerosis w/ mod stenosis, LA mildly dilated, PA pressure   Diverticulosis    DVT (deep vein thrombosis) in pregnancy 1954   a. LLE   Hypertension    Hypothyroidism    on medication   Macular degeneration    Dr. Jesusita Oka   Osteoarthrosis, unspecified whether generalized or localized, other specified sites    PAF (paroxysmal atrial fibrillation) (HCC)    Stopped flecainide, on amiodarone but still has bouts of A FIB (mostly in mornings).    Pure hypercholesterolemia    Staghorn calculus    Left    Past Surgical History:  Procedure Laterality Date   APPENDECTOMY  ~ 1941   AV NODE ABLATION  05/10/2014   AV NODE ABLATION N/A 05/10/2014   Procedure: AV NODE ABLATION;  Surgeon: Hillis Range, MD;  Location: [facility] CATH LAB;  Service: Cardiovascular;  Laterality: N/A;   BUNIONECTOMY WITH HAMMERTOE RECONSTRUCTION Bilateral ~ 1990  CARDIAC PACEMAKER PLACEMENT  06/27/99   Medtronic PM implanted by Dr Amil Amen   CARDIOVERSION N/A 12/02/2013   Procedure: CARDIOVERSION;  Surgeon: Pricilla Riffle, MD;  Location: [facility] ENDOSCOPY;  Service: Cardiovascular;  Laterality: N/A;   CATARACT EXTRACTION W/ INTRAOCULAR LENS  IMPLANT, BILATERAL Bilateral    COLONOSCOPY WITH PROPOFOL N/A 04/22/2013   Procedure: COLONOSCOPY WITH PROPOFOL;  Surgeon: Charolett Bumpers, MD;  Location: [facility] ENDOSCOPY;  Service: Endoscopy;  Laterality: N/A;   DILATION AND CURETTAGE OF UTERUS  X 2   "when I was going thru menopause"   ESOPHAGOGASTRODUODENOSCOPY (EGD) WITH PROPOFOL N/A 04/22/2013   Procedure: ESOPHAGOGASTRODUODENOSCOPY (EGD) WITH PROPOFOL;  Surgeon: Charolett Bumpers, MD;   Location: [facility] ENDOSCOPY;  Service: Endoscopy;  Laterality: N/A;   HERNIA REPAIR     INCISIONAL HERNIA REPAIR     INSERT / REPLACE / REMOVE PACEMAKER  06/2007   "took out the old; put in new"   INSERT / REPLACE / REMOVE PACEMAKER  05/10/2014   MDT PPM generator change by Dr Johney Frame   JOINT REPLACEMENT     PARTIAL NEPHRECTOMY Left 05/1974   stone disease   PERMANENT PACEMAKER GENERATOR CHANGE N/A 05/10/2014   Procedure: PERMANENT PACEMAKER GENERATOR CHANGE;  Surgeon: Hillis Range, MD;  Location: [facility] CATH LAB;  Service: Cardiovascular;  Laterality: N/A;   TONSILLECTOMY AND ADENOIDECTOMY  1930's   TOTAL KNEE ARTHROPLASTY Right 2001    Allergies  Allergen Reactions   Combigan [Brimonidine Tartrate-Timolol] Other (See Comments)    Systemic malaise amigen eye drop   Tape Other (See Comments)    SKIN IS VERY FRAGILE!!   Penicillins Hives   Breo Ellipta [Fluticasone Furoate-Vilanterol] Other (See Comments) and Hypertension    Increased blood pressure   Fluticasone    Other     SEASONAL ALLERGIES   Timolol    Vancomycin Other (See Comments)    Red man syndrome - Mild redness and discomfort. Able to complete initial dose.     Outpatient Encounter Medications as of 01/07/2023  Medication Sig   albuterol (VENTOLIN HFA) 108 (90 Base) MCG/ACT inhaler Inhale 2 puffs into the lungs every 6 (six) hours as needed for wheezing or shortness of breath.   Azelastine HCl 137 MCG/SPRAY SOLN USE 1 TO 2 SPRAYS INTO BOTH NOSTRILS TWICE DAILY.   Calcium 600-10 MG-MCG CHEW Chew 1 capsule by mouth daily.   Calcium Carb-Cholecalciferol (CALCIUM 500/D) 500-10 MG-MCG CHEW Chew 1 tablet by mouth daily.   Cholecalciferol (VITAMIN D) 125 MCG (5000 UT) CAPS Take 1 capsule by mouth daily.   dorzolamide (TRUSOPT) 2 % ophthalmic solution Place 1 drop into both eyes 2 (two) times daily.   famotidine (PEPCID) 40 MG tablet Take 1 tablet (40 mg total) by mouth at bedtime.   fexofenadine (ALLEGRA) 180 MG tablet Take 180 mg by  mouth daily.   fluticasone (FLONASE) 50 MCG/ACT nasal spray USE 2 SPRAYS EACH NOSTRIL ONCE A DAY AS NEEDED FOR ALLERGIES OR CONGESTION.   gabapentin (NEURONTIN) 100 MG capsule Take 200 mg by mouth at bedtime.   ipratropium-albuterol (DUONEB) 0.5-2.5 (3) MG/3ML SOLN Take 3 mLs by nebulization 3 (three) times daily as needed.   ipratropium-albuterol (DUONEB) 0.5-2.5 (3) MG/3ML SOLN Take 3 mLs by nebulization at bedtime.   latanoprost (XALATAN) 0.005 % ophthalmic solution Place 1 drop into both eyes at bedtime.   levothyroxine (SYNTHROID) 75 MCG tablet Take 1 tablet (75 mcg total) by mouth daily.   losartan (COZAAR) 50 MG tablet Take 1 tablet (50  mg total) by mouth daily.   Lutein 20 MG CAPS Take 1 capsule (20 mg total) by mouth daily.   Magnesium 500 MG TABS Take 750 mg by mouth See admin instructions. Take 750 mg by mouth every evening (in conjunction with 3 BioComplete capsules)   metoprolol tartrate (LOPRESSOR) 50 MG tablet Take 1.5 tablets (75 mg total) by mouth in the morning.   mometasone-formoterol (DULERA) 100-5 MCG/ACT AERO Inhale 2 puffs then rinse mouth, twice daily   montelukast (SINGULAIR) 10 MG tablet Take 1 tablet (10 mg total) by mouth daily.   Multiple Vitamins-Minerals (PRESERVISION AREDS 2) CAPS Take 1 capsule by mouth in the morning and at bedtime.   NON FORMULARY Take 3 capsules by mouth See admin instructions. Gundry MD Bio Complete 3 - Prebiotic, Probiotic, Postbiotic to Support Optimal Gut Health capsules- TAKE 3 CAPSULES BY MOUTH EVERY EVENING (in conjunction with 750 mg Magnesium)   potassium chloride (KLOR-CON) 10 MEQ tablet Take 2 tablets (20 mEq total) by mouth daily.   potassium chloride (KLOR-CON) 10 MEQ tablet Take 10 mEq by mouth daily.   predniSONE (DELTASONE) 2.5 MG tablet Take 1 tablet (2.5 mg total) by mouth daily with breakfast.   rivaroxaban (XARELTO) 20 MG TABS tablet Take 1 tablet (20 mg total) by mouth daily with supper.   torsemide (DEMADEX) 20 MG tablet  Take 1 tablet (20 mg total) by mouth daily as needed. 3 lb weight gain in 1 day or 5 lb in 1 week or excessive edema   torsemide (DEMADEX) 20 MG tablet Take 1.5 tablets (30 mg total) by mouth daily.   TYLENOL 500 MG tablet Take 1,000 mg by mouth in the morning, at noon, and at bedtime.   [DISCONTINUED] gabapentin (NEURONTIN) 100 MG capsule Take 1 capsule (100 mg total) by mouth at bedtime. (Patient taking differently: Take 200 mg by mouth at bedtime.)   No facility-administered encounter medications on file as of 01/07/2023.    Review of Systems:  Review of Systems  Health Maintenance  Topic Date Due   INFLUENZA VACCINE  11/07/2022   COVID-19 Vaccine (7 - 2023-24 season) 12/08/2022   Medicare Annual Wellness (AWV)  03/19/2023   DTaP/Tdap/Td (2 - Td or Tdap) 02/23/2029   Pneumonia Vaccine 23+ Years old  Completed   Zoster Vaccines- Shingrix  Completed   HPV VACCINES  Aged Out   DEXA SCAN  Discontinued    Physical Exam: Vitals:   01/07/23 1051  BP: 118/70  Pulse: 74  Resp: 17  Temp: 97.8 F (36.6 C)  TempSrc: Temporal  SpO2: 98%  Weight: 137 lb (62.1 kg)  Height: 5\' 3"  (1.6 m)   Body mass index is 24.27 kg/m. Physical Exam  Labs reviewed: Basic Metabolic Panel: Recent Labs    04/08/22 1045 04/09/22 0501 04/15/22 0000 07/09/22 0000 12/31/22 0000  NA 139 136 143 141 143  K 3.0* 3.6 3.6 3.9 3.7  CL 101 101 101 102 103  CO2 26 24 32* 27* 29*  GLUCOSE 142* 196*  --   --   --   BUN 39* 39* 29* 38* 37*  CREATININE 1.43* 1.39* 1.2* 1.2* 1.3*  CALCIUM 8.9 8.8* 9.5 9.5 10.0  TSH  --   --   --   --  2.30   Liver Function Tests: Recent Labs    03/21/22 0000 04/08/22 1045 07/09/22 0000  AST 34  34 22 20  ALT 37*  37* 15 11  ALKPHOS 53  53 42 67  BILITOT  --  1.4*  --   PROT  --  6.2*  --   ALBUMIN 4.1  4.1 4.0 3.6   No results for input(s): "LIPASE", "AMYLASE" in the last 8760 hours. No results for input(s): "AMMONIA" in the last 8760 hours. CBC: Recent  Labs    04/08/22 1045 04/09/22 0501 04/15/22 0000 07/09/22 0000  WBC 12.8* 9.8 10.8  10.8 8.2  NEUTROABS 8.7*  --  8.00  --   HGB 12.0 11.5* 12.0  12.0 11.4*  HCT 38.4 36.7 37  37 35*  MCV 100.0 100.3*  --   --   PLT 223 217 330  330 294   Lipid Panel: No results for input(s): "CHOL", "HDL", "LDLCALC", "TRIG", "CHOLHDL", "LDLDIRECT" in the last 8760 hours. Lab Results  Component Value Date   HGBA1C 5.7 (H) 06/25/2017    Procedures since last visit: CUP PACEART REMOTE DEVICE CHECK  Result Date: 12/17/2022 Scheduled remote reviewed. Normal device function.  Permanent AF, Xarelto per PA report Next remote 91 days. LA, CVRS   Assessment/Plan 1. COPD mixed type (HCC) ***  2. Stage 3a chronic kidney disease (HCC) ***  3. Essential hypertension ***  4. Acquired hypothyroidism ***  5. Chronic diastolic congestive heart failure (HCC) Change her Potassium to 30 meq  6. Primary osteoarthritis of left knee Can use salon Pas  7. Permanent atrial fibrillation (HCC) Change Xarelto to 15 mg   8. Sick sinus syndrome (HCC) ***  9 Neuropathy Change Neurontin to 200 mg  Labs/tests ordered:  * No order type specified * Next appt:  Visit date not found

## 2023-01-09 DIAGNOSIS — Z23 Encounter for immunization: Secondary | ICD-10-CM | POA: Diagnosis not present

## 2023-01-14 DIAGNOSIS — D3132 Benign neoplasm of left choroid: Secondary | ICD-10-CM | POA: Diagnosis not present

## 2023-01-14 DIAGNOSIS — H401131 Primary open-angle glaucoma, bilateral, mild stage: Secondary | ICD-10-CM | POA: Diagnosis not present

## 2023-01-14 DIAGNOSIS — H353122 Nonexudative age-related macular degeneration, left eye, intermediate dry stage: Secondary | ICD-10-CM | POA: Diagnosis not present

## 2023-01-14 DIAGNOSIS — Z961 Presence of intraocular lens: Secondary | ICD-10-CM | POA: Diagnosis not present

## 2023-01-14 DIAGNOSIS — H35371 Puckering of macula, right eye: Secondary | ICD-10-CM | POA: Diagnosis not present

## 2023-01-14 DIAGNOSIS — H353114 Nonexudative age-related macular degeneration, right eye, advanced atrophic with subfoveal involvement: Secondary | ICD-10-CM | POA: Diagnosis not present

## 2023-02-04 ENCOUNTER — Telehealth: Payer: Self-pay | Admitting: Internal Medicine

## 2023-02-04 ENCOUNTER — Encounter: Payer: Self-pay | Admitting: Orthopedic Surgery

## 2023-02-04 ENCOUNTER — Non-Acute Institutional Stay: Payer: Medicare Other | Admitting: Orthopedic Surgery

## 2023-02-04 VITALS — BP 152/72 | HR 70 | Temp 97.6°F | Resp 18 | Ht 63.0 in | Wt 132.0 lb

## 2023-02-04 DIAGNOSIS — J069 Acute upper respiratory infection, unspecified: Secondary | ICD-10-CM

## 2023-02-04 DIAGNOSIS — J441 Chronic obstructive pulmonary disease with (acute) exacerbation: Secondary | ICD-10-CM | POA: Diagnosis not present

## 2023-02-04 MED ORDER — PREDNISONE 20 MG PO TABS
40.0000 mg | ORAL_TABLET | Freq: Every day | ORAL | 0 refills | Status: AC
Start: 2023-02-04 — End: 2023-02-09

## 2023-02-04 MED ORDER — DOXYCYCLINE HYCLATE 100 MG PO TABS: 100.0000 mg | ORAL_TABLET | Freq: Two times a day (BID) | ORAL | 0 refills | Status: AC

## 2023-02-04 NOTE — Patient Instructions (Signed)
Suspect URI/ COPD exacerbation  Will start increased prednisone 40 mg x 5 days> ok to take with low dose prednisone  Will start doxycycline x 7 days as precaution  Please let us know if symptoms to not improve> may need chest xray   Follow up in 2 weeks

## 2023-02-04 NOTE — Telephone Encounter (Signed)
Nurse from [facility] called to ask for appointment with Dr. Chales Abrahams for today and schedule is full, Nurse stated that she will contact Dr. Chales Abrahams to ask for suggestions on how to make patient to feel better.

## 2023-02-04 NOTE — Progress Notes (Signed)
Location:   [facility]   Place of Service:   [facility] Provider:  Octavia Heir, NP   Mahlon Gammon, MD  Patient Care Team: Mahlon Gammon, MD as PCP - General (Internal Medicine) Jake Bathe, MD as PCP - Cardiology (Cardiology) Hillis Range, MD (Inactive) as Consulting Physician (Cardiology) Waymon Budge, MD as Consulting Physician (Pulmonary Disease) Jethro Bolus, MD as Consulting Physician (Ophthalmology) Charolett Bumpers, MD as Consulting Physician (Gastroenterology) Ollen Gross, MD as Consulting Physician (Orthopedic Surgery)  Extended Emergency Contact Information Primary Emergency Contact: Corlis Leak of [place] Home Phone: [phone] Mobile Phone: [phone] Relation: Daughter Secondary Emergency Contact: Abram Sander of [place] Home Phone: [phone] Mobile Phone: [phone] Relation: Son  Code Status:  DNR Goals of care: Advanced Directive information    01/07/2023   10:54 AM  Advanced Directives  Does Patient Have a Medical Advance Directive? Yes  Type of Estate agent of [place];Out of facility DNR (pink MOST or yellow form);Living will  Does patient want to make changes to medical advance directive? No - Patient declined  Copy of Healthcare Power of Attorney in Chart? Yes - validated most recent copy scanned in chart (See row information)     No chief complaint on file.   HPI:  Pt is a 89 y.o. female seen today for acute visit due to ongoing cough.   H/o COPD and CHF. Symptoms of increased nasal congestion and productive cough x 2 days. She has given herself 3 nebulizer treatments without success. Sputum yellow/brown and thick. Cough is keeping her up at night. Denies other cold symptoms. Denies contact with sick persons. Afebrile. Vitals stable. Remains on albuterol prn, duonebs, Dulera, Singulair, torsemide and low dose prednisone.     Past Medical History:   Diagnosis Date   Anemia    Aortic stenosis    a. Echo 09/06/12 EF 55-60%, no WMA, G2DD, Ao valve sclerosis w/ mod stenosis, LA mildly dilated, PA pressure   Asthmatic bronchitis    Complete heart block (HCC)    s/p permanent pacemaker 06/27/1999 (Battery change 06/2007 and 2016).  s/p AV nodal ablation by Dr Johney Frame 2016.   COPD (chronic obstructive pulmonary disease) (HCC)    Dr. Maple Hudson   DDD (degenerative disc disease)    Diastolic dysfunction    a. Echo 09/06/12 EF 55-60%, no WMA, G2DD, Ao valve sclerosis w/ mod stenosis, LA mildly dilated, PA pressure   Diverticulosis    DVT (deep vein thrombosis) in pregnancy 1954   a. LLE   Hypertension    Hypothyroidism    on medication   Macular degeneration    Dr. Jesusita Oka   Osteoarthrosis, unspecified whether generalized or localized, other specified sites    PAF (paroxysmal atrial fibrillation) (HCC)    Stopped flecainide, on amiodarone but still has bouts of A FIB (mostly in mornings).    Pure hypercholesterolemia    Staghorn calculus    Left   Past Surgical History:  Procedure Laterality Date   APPENDECTOMY  ~ 1941   AV NODE ABLATION  05/10/2014   AV NODE ABLATION N/A 05/10/2014   Procedure: AV NODE ABLATION;  Surgeon: Hillis Range, MD;  Location: [facility] C[facility] LAB;  Service: Cardiovascular;  Laterality: N/A;   BUNIONECTOMY WITH HAMMERTOE RECONSTRUCTION Bilateral ~ 1990   CARDIAC PACEMAKER PLACEMENT  06/27/99   Medtronic PM implanted by Dr Amil Amen   CARDIOVERSION N/A 12/02/2013   Procedure: CARDIOVERSION;  Surgeon: Dietrich Pates  V, MD;  Location: [facility] ENDOSCOPY;  Service: Cardiovascular;  Laterality: N/A;   CATARACT EXTRACTION W/ INTRAOCULAR LENS  IMPLANT, BILATERAL Bilateral    COLONOSCOPY WITH PROPOFOL N/A 04/22/2013   Procedure: COLONOSCOPY WITH PROPOFOL;  Surgeon: Charolett Bumpers, MD;  Location: [facility] ENDOSCOPY;  Service: Endoscopy;  Laterality: N/A;   DILATION AND CURETTAGE OF UTERUS  X 2   "when I was going thru menopause"    ESOPHAGOGASTRODUODENOSCOPY (EGD) WITH PROPOFOL N/A 04/22/2013   Procedure: ESOPHAGOGASTRODUODENOSCOPY (EGD) WITH PROPOFOL;  Surgeon: Charolett Bumpers, MD;  Location: [facility] ENDOSCOPY;  Service: Endoscopy;  Laterality: N/A;   HERNIA REPAIR     INCISIONAL HERNIA REPAIR     INSERT / REPLACE / REMOVE PACEMAKER  06/2007   "took out the old; put in new"   INSERT / REPLACE / REMOVE PACEMAKER  05/10/2014   MDT PPM generator change by Dr Johney Frame   JOINT REPLACEMENT     PARTIAL NEPHRECTOMY Left 05/1974   stone disease   PERMANENT PACEMAKER GENERATOR CHANGE N/A 05/10/2014   Procedure: PERMANENT PACEMAKER GENERATOR CHANGE;  Surgeon: Hillis Range, MD;  Location: [facility] CATH LAB;  Service: Cardiovascular;  Laterality: N/A;   TONSILLECTOMY AND ADENOIDECTOMY  1930's   TOTAL KNEE ARTHROPLASTY Right 2001    Allergies  Allergen Reactions   Combigan [Brimonidine Tartrate-Timolol] Other (See Comments)    Systemic malaise amigen eye drop   Tape Other (See Comments)    SKIN IS VERY FRAGILE!!   Penicillins Hives   Breo Ellipta [Fluticasone Furoate-Vilanterol] Other (See Comments) and Hypertension    Increased blood pressure   Fluticasone    Other     SEASONAL ALLERGIES   Timolol    Vancomycin Other (See Comments)    Red man syndrome - Mild redness and discomfort. Able to complete initial dose.     Outpatient Encounter Medications as of 02/04/2023  Medication Sig   albuterol (VENTOLIN HFA) 108 (90 Base) MCG/ACT inhaler Inhale 2 puffs into the lungs every 6 (six) hours as needed for wheezing or shortness of breath.   Azelastine HCl 137 MCG/SPRAY SOLN USE 1 TO 2 SPRAYS INTO BOTH NOSTRILS TWICE DAILY.   Calcium 600-10 MG-MCG CHEW Chew 1 capsule by mouth daily.   Calcium Carb-Cholecalciferol (CALCIUM 500/D) 500-10 MG-MCG CHEW Chew 1 tablet by mouth daily.   Cholecalciferol (VITAMIN D) 125 MCG (5000 UT) CAPS Take 1 capsule by mouth daily.   dorzolamide (TRUSOPT) 2 % ophthalmic solution Place 1 drop into both eyes 2 (two)  times daily.   famotidine (PEPCID) 40 MG tablet Take 1 tablet (40 mg total) by mouth at bedtime.   fexofenadine (ALLEGRA) 180 MG tablet Take 180 mg by mouth daily.   fluticasone (FLONASE) 50 MCG/ACT nasal spray USE 2 SPRAYS EACH NOSTRIL ONCE A DAY AS NEEDED FOR ALLERGIES OR CONGESTION.   gabapentin (NEURONTIN) 100 MG capsule Take 200 mg by mouth at bedtime.   ipratropium-albuterol (DUONEB) 0.5-2.5 (3) MG/3ML SOLN Take 3 mLs by nebulization 3 (three) times daily as needed.   ipratropium-albuterol (DUONEB) 0.5-2.5 (3) MG/3ML SOLN Take 3 mLs by nebulization at bedtime.   latanoprost (XALATAN) 0.005 % ophthalmic solution Place 1 drop into both eyes at bedtime.   levothyroxine (SYNTHROID) 75 MCG tablet Take 1 tablet (75 mcg total) by mouth daily.   losartan (COZAAR) 50 MG tablet Take 1 tablet (50 mg total) by mouth daily.   Lutein 20 MG CAPS Take 1 capsule (20 mg total) by mouth daily.   Magnesium 500 MG TABS  Take 750 mg by mouth See admin instructions. Take 750 mg by mouth every evening (in conjunction with 3 BioComplete capsules)   metoprolol tartrate (LOPRESSOR) 50 MG tablet Take 1.5 tablets (75 mg total) by mouth in the morning.   mometasone-formoterol (DULERA) 100-5 MCG/ACT AERO Inhale 2 puffs then rinse mouth, twice daily   montelukast (SINGULAIR) 10 MG tablet Take 1 tablet (10 mg total) by mouth daily.   Multiple Vitamins-Minerals (PRESERVISION AREDS 2) CAPS Take 1 capsule by mouth in the morning and at bedtime.   NON FORMULARY Take 3 capsules by mouth See admin instructions. Gundry MD Bio Complete 3 - Prebiotic, Probiotic, Postbiotic to Support Optimal Gut Health capsules- TAKE 3 CAPSULES BY MOUTH EVERY EVENING (in conjunction with 750 mg Magnesium)   potassium chloride (KLOR-CON) 10 MEQ tablet Take 2 tablets (20 mEq total) by mouth daily.   potassium chloride (KLOR-CON) 10 MEQ tablet Take 10 mEq by mouth daily.   predniSONE (DELTASONE) 2.5 MG tablet Take 1 tablet (2.5 mg total) by mouth daily  with breakfast.   Rivaroxaban (XARELTO) 15 MG TABS tablet Take 15 mg by mouth daily.   torsemide (DEMADEX) 20 MG tablet Take 1 tablet (20 mg total) by mouth daily as needed. 3 lb weight gain in 1 day or 5 lb in 1 week or excessive edema   torsemide (DEMADEX) 20 MG tablet Take 1.5 tablets (30 mg total) by mouth daily.   TYLENOL 500 MG tablet Take 1,000 mg by mouth in the morning, at noon, and at bedtime.   No facility-administered encounter medications on file as of 02/04/2023.    Review of Systems  Constitutional:  Positive for fatigue. Negative for activity change, appetite change and fever.  HENT:  Positive for congestion. Negative for sinus pressure, sinus pain, sore throat, trouble swallowing and voice change.   Respiratory:  Positive for cough and wheezing. Negative for chest tightness and shortness of breath.   Cardiovascular:  Negative for chest pain and leg swelling.  Gastrointestinal:  Negative for nausea and vomiting.  Musculoskeletal:  Negative for myalgias.  Skin:  Negative for rash.  Neurological:  Positive for weakness. Negative for headaches.  Psychiatric/Behavioral:  Negative for dysphoric mood. The patient is not nervous/anxious.     Immunization History  Administered Date(s) Administered   Influenza Split 01/07/2011   Influenza Whole 01/16/2009, 01/23/2010   Influenza, High Dose Seasonal PF 01/06/2013, 01/15/2019, 02/04/2020   Influenza,inj,Quad PF,6+ Mos 02/17/2015, 01/30/2018, 01/07/2022   Influenza,inj,quad, With Preservative 01/06/2017   Influenza-Unspecified 01/06/2014, 02/01/2016, 02/04/2020, 01/26/2021   Moderna Covid-19 Vaccine Bivalent Booster 60yrs & up 08/09/2021   Moderna SARS-COV2 Booster Vaccination 08/18/2020   Moderna Sars-Covid-2 Vaccination 04/20/2019, 05/18/2019, 02/22/2020   Pfizer(Comirnaty)Fall Seasonal Vaccine 12 years and older 01/28/2022   Pneumococcal Conjugate-13 02/24/2019   Pneumococcal Polysaccharide-23 11/23/2013   Respiratory  Syncytial Virus Vaccine,Recomb Aduvanted(Arexvy) 01/07/2022   Tdap 02/24/2019   Zoster Recombinant(Shingrix) 03/13/2017, 05/20/2017   Pertinent  Health Maintenance Due  Topic Date Due   INFLUENZA VACCINE  11/07/2022   DEXA SCAN  Discontinued      05/28/2022    1:42 PM 06/10/2022    3:10 PM 08/13/2022    9:34 AM 11/04/2022    1:31 PM 01/07/2023   10:53 AM  Fall Risk  Falls in the past year? 0 1 1 1  0  Was there an injury with Fall? 0 0 1 1 0  Fall Risk Category Calculator 0 1 3 2  0  Patient at Risk for Falls Due to  Impaired balance/gait;Impaired mobility Impaired mobility;Impaired balance/gait History of fall(s) History of fall(s) No Fall Risks  Fall risk Follow up Falls evaluation completed Falls evaluation completed Falls evaluation completed Falls evaluation completed Falls evaluation completed   Functional Status Survey:    There were no vitals filed for this visit. There is no height or weight on file to calculate BMI. Physical Exam Vitals reviewed.  Constitutional:      General: She is not in acute distress. HENT:     Head: Normocephalic.     Right Ear: Tympanic membrane normal.     Left Ear: Tympanic membrane normal.     Nose: Rhinorrhea present.     Mouth/Throat:     Pharynx: No oropharyngeal exudate or posterior oropharyngeal erythema.  Eyes:     General:        Right eye: No discharge.        Left eye: No discharge.  Cardiovascular:     Rate and Rhythm: Normal rate and regular rhythm.     Pulses: Normal pulses.     Heart sounds: Normal heart sounds.  Pulmonary:     Effort: Pulmonary effort is normal. No respiratory distress.     Breath sounds: Wheezing and rhonchi present. No rales.  Abdominal:     General: Bowel sounds are normal.     Palpations: Abdomen is soft.  Musculoskeletal:     Right lower leg: No edema.     Left lower leg: No edema.  Lymphadenopathy:     Cervical: No cervical adenopathy.  Skin:    General: Skin is warm.     Capillary Refill:  Capillary refill takes less than 2 seconds.  Neurological:     General: No focal deficit present.     Mental Status: She is alert and oriented to person, place, and time.     Motor: Weakness present.     Gait: Gait abnormal.     Comments: wheelchair  Psychiatric:        Mood and Affect: Mood normal.     Labs reviewed: Recent Labs    04/08/22 1045 04/09/22 0501 04/15/22 0000 07/09/22 0000 12/31/22 0000  NA 139 136 143 141 143  K 3.0* 3.6 3.6 3.9 3.7  CL 101 101 101 102 103  CO2 26 24 32* 27* 29*  GLUCOSE 142* 196*  --   --   --   BUN 39* 39* 29* 38* 37*  CREATININE 1.43* 1.39* 1.2* 1.2* 1.3*  CALCIUM 8.9 8.8* 9.5 9.5 10.0   Recent Labs    03/21/22 0000 04/08/22 1045 07/09/22 0000  AST 34  34 22 20  ALT 37*  37* 15 11  ALKPHOS 53  53 42 67  BILITOT  --  1.4*  --   PROT  --  6.2*  --   ALBUMIN 4.1  4.1 4.0 3.6   Recent Labs    04/08/22 1045 04/09/22 0501 04/15/22 0000 07/09/22 0000  WBC 12.8* 9.8 10.8  10.8 8.2  NEUTROABS 8.7*  --  8.00  --   HGB 12.0 11.5* 12.0  12.0 11.4*  HCT 38.4 36.7 37  37 35*  MCV 100.0 100.3*  --   --   PLT 223 217 330  330 294   Lab Results  Component Value Date   TSH 2.30 12/31/2022   Lab Results  Component Value Date   HGBA1C 5.7 (H) 06/25/2017   Lab Results  Component Value Date   CHOL 138 11/04/2017   HDL 70  11/04/2017   LDLCALC 58 11/04/2017   TRIG 53 11/04/2017   CHOLHDL 3.1 06/25/2017    Significant Diagnostic Results in last 30 days:  No results found.  Assessment/Plan 1. COPD with acute exacerbation (HCC) - increased productive x 2 days - expiratory wheezing on exam - cont low dose prednisone - start prednisone 40 mg po daily x 5 days - cont dulera, duonebs and albuterol prn - predniSONE (DELTASONE) 20 MG tablet; Take 2 tablets (40 mg total) by mouth daily with breakfast for 5 days.  Dispense: 10 tablet; Refill: 0  2. Upper respiratory tract infection, unspecified type - productive cough x 2  days - thick yellow/brown sputum  - rhonchi to upper fields - recommend CXR if cough continues - doxycycline (VIBRA-TABS) 100 MG tablet; Take 1 tablet (100 mg total) by mouth 2 (two) times daily for 7 days.  Dispense: 14 tablet; Refill: 0    Family/ staff Communication: plan discussed with patient and nurse> f/u in 2 weeks  Labs/tests ordered:  none

## 2023-02-08 DIAGNOSIS — I517 Cardiomegaly: Secondary | ICD-10-CM | POA: Diagnosis not present

## 2023-02-08 DIAGNOSIS — Z950 Presence of cardiac pacemaker: Secondary | ICD-10-CM | POA: Diagnosis not present

## 2023-02-08 DIAGNOSIS — R0689 Other abnormalities of breathing: Secondary | ICD-10-CM | POA: Diagnosis not present

## 2023-02-11 DIAGNOSIS — M25511 Pain in right shoulder: Secondary | ICD-10-CM | POA: Diagnosis not present

## 2023-02-18 ENCOUNTER — Encounter: Payer: Medicare Other | Admitting: Internal Medicine

## 2023-02-25 ENCOUNTER — Encounter: Payer: Self-pay | Admitting: Internal Medicine

## 2023-02-25 ENCOUNTER — Non-Acute Institutional Stay: Payer: Medicare Other | Admitting: Internal Medicine

## 2023-02-25 VITALS — BP 144/80 | HR 89 | Temp 97.9°F | Resp 17 | Ht 63.0 in | Wt 135.2 lb

## 2023-02-25 DIAGNOSIS — K219 Gastro-esophageal reflux disease without esophagitis: Secondary | ICD-10-CM | POA: Diagnosis not present

## 2023-02-25 DIAGNOSIS — J449 Chronic obstructive pulmonary disease, unspecified: Secondary | ICD-10-CM

## 2023-02-25 DIAGNOSIS — I10 Essential (primary) hypertension: Secondary | ICD-10-CM | POA: Diagnosis not present

## 2023-02-25 DIAGNOSIS — I5032 Chronic diastolic (congestive) heart failure: Secondary | ICD-10-CM

## 2023-02-25 DIAGNOSIS — M1712 Unilateral primary osteoarthritis, left knee: Secondary | ICD-10-CM

## 2023-02-25 DIAGNOSIS — I4821 Permanent atrial fibrillation: Secondary | ICD-10-CM

## 2023-02-25 DIAGNOSIS — E039 Hypothyroidism, unspecified: Secondary | ICD-10-CM

## 2023-02-25 DIAGNOSIS — N1831 Chronic kidney disease, stage 3a: Secondary | ICD-10-CM

## 2023-02-25 DIAGNOSIS — I495 Sick sinus syndrome: Secondary | ICD-10-CM

## 2023-02-25 NOTE — Progress Notes (Unsigned)
Location: [facility] features editor of Service:  Clinic (12)  Provider:   Code Status: DNR Goals of Care:     02/25/2023   11:09 AM  Advanced Directives  Does Patient Have a Medical Advance Directive? Yes  Type of Estate agent of [place];Out of facility DNR (pink MOST or yellow form);Living will  Does patient want to make changes to medical advance directive? No - Patient declined  Copy of Healthcare Power of Attorney in Chart? Yes - validated most recent copy scanned in chart (See row information)     Chief Complaint  Patient presents with   Medical Management of Chronic Issues    Patient is being seen for follow up     HPI: Patient is a 89 y.o. female seen today for Regular Visit  [place]ives in [place] in [place] with her caregiver Now has 24/7 caregivers   Patient has h/o A. fib on Xarelto     s/p AV nodal ablation, s/p PPM in 2009 Aortic stenosis   COPD with frequent exacerbations.  Not on oxygen.  On chronic prednisone.  Follows closely with Dr. Maple Hudson.  Is enrolled in palliative care Recently was seen in the office for Cough Given Doxy and Prednisone   Prednisone dose has been decreased to 2.5 mg per Dr Maple Hudson Also Jody Blake Uses Nebs 1-2 times a day to help with Cough and SOB Cannot walk much anymore   Chronic diastolic CHF  History of chronic venous insufficiency Vision loss due to macular degeneration and glaucoma Very HOH Osteopenia    Acute DVT in 06/12/2022 post Covid     Left Knee swelling and pain Has it injected many times with no benefits Does says it does not buckle anymore  Past Medical History:  Diagnosis Date   Anemia    Aortic stenosis    a. Echo 09/06/12 EF 55-60%, no WMA, G2DD, Ao valve sclerosis w/ mod stenosis, LA mildly dilated, PA pressure   Asthmatic bronchitis    Complete heart block (HCC)    s/p permanent pacemaker 06/27/1999 (Battery change 06/2007 and 2016).  s/p AV nodal ablation by Dr Johney Frame  2016.   COPD (chronic obstructive pulmonary disease) (HCC)    Dr. Maple Hudson   DDD (degenerative disc disease)    Diastolic dysfunction    a. Echo 09/06/12 EF 55-60%, no WMA, G2DD, Ao valve sclerosis w/ mod stenosis, LA mildly dilated, PA pressure   Diverticulosis    DVT (deep vein thrombosis) in pregnancy 1954   a. LLE   Hypertension    Hypothyroidism    on medication   Macular degeneration    Dr. Jesusita Oka   Osteoarthrosis, unspecified whether generalized or localized, other specified sites    PAF (paroxysmal atrial fibrillation) (HCC)    Stopped flecainide, on amiodarone but still has bouts of A FIB (mostly in mornings).    Pure hypercholesterolemia    Staghorn calculus    Left    Past Surgical History:  Procedure Laterality Date   APPENDECTOMY  ~ 1941   AV NODE ABLATION  05/10/2014   AV NODE ABLATION N/A 05/10/2014   Procedure: AV NODE ABLATION;  Surgeon: Hillis Range, MD;  Location: [facility] CATH LAB;  Service: Cardiovascular;  Laterality: N/A;   BUNIONECTOMY WITH HAMMERTOE RECONSTRUCTION Bilateral ~ 1990   CARDIAC PACEMAKER PLACEMENT  06/27/99   Medtronic PM implanted by Dr Amil Amen   CARDIOVERSION N/A 12/02/2013   Procedure: CARDIOVERSION;  Surgeon: Pricilla Riffle, MD;  Location: [facility] ENDOSCOPY;  Service: Cardiovascular;  Laterality: N/A;   CATARACT EXTRACTION W/ INTRAOCULAR LENS  IMPLANT, BILATERAL Bilateral    COLONOSCOPY WITH PROPOFOL N/A 04/22/2013   Procedure: COLONOSCOPY WITH PROPOFOL;  Surgeon: Charolett Bumpers, MD;  Location: [facility] ENDOSCOPY;  Service: Endoscopy;  Laterality: N/A;   DILATION AND CURETTAGE OF UTERUS  X 2   "when I was going thru menopause"   ESOPHAGOGASTRODUODENOSCOPY (EGD) WITH PROPOFOL N/A 04/22/2013   Procedure: ESOPHAGOGASTRODUODENOSCOPY (EGD) WITH PROPOFOL;  Surgeon: Charolett Bumpers, MD;  Location: [facility] ENDOSCOPY;  Service: Endoscopy;  Laterality: N/A;   HERNIA REPAIR     INCISIONAL HERNIA REPAIR     INSERT / REPLACE / REMOVE PACEMAKER  06/2007   "took out the old;  put in new"   INSERT / REPLACE / REMOVE PACEMAKER  05/10/2014   MDT PPM generator change by Dr Johney Frame   JOINT REPLACEMENT     PARTIAL NEPHRECTOMY Left 05/1974   stone disease   PERMANENT PACEMAKER GENERATOR CHANGE N/A 05/10/2014   Procedure: PERMANENT PACEMAKER GENERATOR CHANGE;  Surgeon: Hillis Range, MD;  Location: [facility] CATH LAB;  Service: Cardiovascular;  Laterality: N/A;   TONSILLECTOMY AND ADENOIDECTOMY  1930's   TOTAL KNEE ARTHROPLASTY Right 2001    Allergies  Allergen Reactions   Combigan [Brimonidine Tartrate-Timolol] Other (See Comments)    Systemic malaise amigen eye drop   Tape Other (See Comments)    SKIN IS VERY FRAGILE!!   Penicillins Hives   Breo Ellipta [Fluticasone Furoate-Vilanterol] Other (See Comments) and Hypertension    Increased blood pressure   Fluticasone    Other     SEASONAL ALLERGIES   Timolol    Vancomycin Other (See Comments)    Red man syndrome - Mild redness and discomfort. Able to complete initial dose.     Outpatient Encounter Medications as of 02/25/2023  Medication Sig   albuterol (VENTOLIN HFA) 108 (90 Base) MCG/ACT inhaler Inhale 2 puffs into the lungs every 6 (six) hours as needed for wheezing or shortness of breath.   Azelastine HCl 137 MCG/SPRAY SOLN USE 1 TO 2 SPRAYS INTO BOTH NOSTRILS TWICE DAILY.   Calcium 600-10 MG-MCG CHEW Chew 1 capsule by mouth daily.   Cholecalciferol (VITAMIN D) 125 MCG (5000 UT) CAPS Take 1 capsule by mouth daily.   dorzolamide (TRUSOPT) 2 % ophthalmic solution Place 1 drop into both eyes 2 (two) times daily.   famotidine (PEPCID) 40 MG tablet Take 1 tablet (40 mg total) by mouth at bedtime.   fexofenadine (ALLEGRA) 180 MG tablet Take 180 mg by mouth daily.   fluticasone (FLONASE) 50 MCG/ACT nasal spray USE 2 SPRAYS EACH NOSTRIL ONCE A DAY AS NEEDED FOR ALLERGIES OR CONGESTION.   gabapentin (NEURONTIN) 100 MG capsule Take 200 mg by mouth at bedtime.   ipratropium-albuterol (DUONEB) 0.5-2.5 (3) MG/3ML SOLN Take 3 mLs  by nebulization 3 (three) times daily as needed.   latanoprost (XALATAN) 0.005 % ophthalmic solution Place 1 drop into both eyes at bedtime.   levothyroxine (SYNTHROID) 75 MCG tablet Take 1 tablet (75 mcg total) by mouth daily.   losartan (COZAAR) 50 MG tablet Take 1 tablet (50 mg total) by mouth daily.   Lutein 20 MG CAPS Take 1 capsule (20 mg total) by mouth daily.   Magnesium 500 MG TABS Take 750 mg by mouth See admin instructions. Take 750 mg by mouth every evening (in conjunction with 3 BioComplete capsules)   metoprolol tartrate (LOPRESSOR) 50 MG tablet Take 1.5 tablets (75  mg total) by mouth in the morning.   mometasone-formoterol (DULERA) 100-5 MCG/ACT AERO Inhale 2 puffs then rinse mouth, twice daily   montelukast (SINGULAIR) 10 MG tablet Take 1 tablet (10 mg total) by mouth daily.   Multiple Vitamins-Minerals (PRESERVISION AREDS 2) CAPS Take 1 capsule by mouth in the morning and at bedtime.   NON FORMULARY Take 3 capsules by mouth See admin instructions. Gundry MD Bio Complete 3 - Prebiotic, Probiotic, Postbiotic to Support Optimal Gut Health capsules- TAKE 3 CAPSULES BY MOUTH EVERY EVENING (in conjunction with 750 mg Magnesium)   potassium chloride (KLOR-CON) 10 MEQ tablet Take 2 tablets (20 mEq total) by mouth daily.   potassium chloride (KLOR-CON) 10 MEQ tablet Take 10 mEq by mouth daily.   predniSONE (DELTASONE) 2.5 MG tablet Take 1 tablet (2.5 mg total) by mouth daily with breakfast.   Rivaroxaban (XARELTO) 15 MG TABS tablet Take 15 mg by mouth daily.   torsemide (DEMADEX) 20 MG tablet Take 1 tablet (20 mg total) by mouth daily as needed. 3 lb weight gain in 1 day or 5 lb in 1 week or excessive edema   TYLENOL 500 MG tablet Take 1,000 mg by mouth in the morning, at noon, and at bedtime.   Calcium Carb-Cholecalciferol (CALCIUM 500/D) 500-10 MG-MCG CHEW Chew 1 tablet by mouth daily. (Patient not taking: Reported on 02/25/2023)   ipratropium-albuterol (DUONEB) 0.5-2.5 (3) MG/3ML SOLN  Take 3 mLs by nebulization at bedtime. (Patient not taking: Reported on 02/25/2023)   torsemide (DEMADEX) 20 MG tablet Take 1.5 tablets (30 mg total) by mouth daily. (Patient not taking: Reported on 02/25/2023)   No facility-administered encounter medications on file as of 02/25/2023.    Review of Systems:  Review of Systems  Constitutional:  Negative for activity change and appetite change.  HENT: Negative.    Respiratory:  Positive for cough and shortness of breath.   Cardiovascular:  Negative for leg swelling.  Gastrointestinal:  Negative for constipation.  Genitourinary: Negative.   Musculoskeletal:  Positive for arthralgias, gait problem and myalgias.  Skin: Negative.   Neurological:  Negative for dizziness and weakness.  Psychiatric/Behavioral:  Negative for confusion, dysphoric mood and sleep disturbance.     Health Maintenance  Topic Date Due   Medicare Annual Wellness (AWV)  03/19/2023   COVID-19 Vaccine (8 - 2023-24 season) 03/11/2023   DTaP/Tdap/Td (2 - Td or Tdap) 02/23/2029   Pneumonia Vaccine 103+ Years old  Completed   INFLUENZA VACCINE  Completed   Zoster Vaccines- Shingrix  Completed   HPV VACCINES  Aged Out   DEXA SCAN  Discontinued    Physical Exam: Vitals:   02/25/23 1106  BP: (!) 144/80  Pulse: 89  Resp: 17  Temp: 97.9 F (36.6 C)  TempSrc: Temporal  SpO2: 90%  Weight: 135 lb 3.2 oz (61.3 kg)  Height: 5\' 3"  (1.6 m)   Body mass index is 23.95 kg/m. Physical Exam Vitals reviewed.  Constitutional:      Appearance: Normal appearance.  HENT:     Head: Normocephalic.     Nose: Nose normal.     Mouth/Throat:     Mouth: Mucous membranes are moist.     Pharynx: Oropharynx is clear.  Eyes:     Pupils: Pupils are equal, round, and reactive to light.  Cardiovascular:     Rate and Rhythm: Normal rate. Rhythm irregular.     Pulses: Normal pulses.  Pulmonary:     Effort: Pulmonary effort is normal. No respiratory distress.  Breath sounds: Normal  breath sounds. No wheezing or rales.  Abdominal:     General: Abdomen is flat. Bowel sounds are normal.     Palpations: Abdomen is soft.  Musculoskeletal:        General: No swelling.     Cervical back: Neck supple.  Skin:    General: Skin is warm.  Neurological:     General: No focal deficit present.     Mental Status: She is alert and oriented to person, place, and time.     Comments: Very HOH and Poor Vision  Psychiatric:        Mood and Affect: Mood normal.        Thought Content: Thought content normal.     Labs reviewed: Basic Metabolic Panel: Recent Labs    04/08/22 1045 04/09/22 0501 04/15/22 0000 07/09/22 0000 12/31/22 0000  NA 139 136 143 141 143  K 3.0* 3.6 3.6 3.9 3.7  CL 101 101 101 102 103  CO2 26 24 32* 27* 29*  GLUCOSE 142* 196*  --   --   --   BUN 39* 39* 29* 38* 37*  CREATININE 1.43* 1.39* 1.2* 1.2* 1.3*  CALCIUM 8.9 8.8* 9.5 9.5 10.0  TSH  --   --   --   --  2.30   Liver Function Tests: Recent Labs    03/21/22 0000 04/08/22 1045 07/09/22 0000  AST 34  34 22 20  ALT 37*  37* 15 11  ALKPHOS 53  53 42 67  BILITOT  --  1.4*  --   PROT  --  6.2*  --   ALBUMIN 4.1  4.1 4.0 3.6   No results for input(s): "LIPASE", "AMYLASE" in the last 8760 hours. No results for input(s): "AMMONIA" in the last 8760 hours. CBC: Recent Labs    04/08/22 1045 04/09/22 0501 04/15/22 0000 07/09/22 0000  WBC 12.8* 9.8 10.8  10.8 8.2  NEUTROABS 8.7*  --  8.00  --   HGB 12.0 11.5* 12.0  12.0 11.4*  HCT 38.4 36.7 37  37 35*  MCV 100.0 100.3*  --   --   PLT 223 217 330  330 294   Lipid Panel: No results for input(s): "CHOL", "HDL", "LDLCALC", "TRIG", "CHOLHDL", "LDLDIRECT" in the last 8760 hours. Lab Results  Component Value Date   HGBA1C 5.7 (H) 06/25/2017    Procedures since last visit: No results found.  Assessment/Plan 1. COPD mixed type (HCC) Continues to stay stable Follows with Dr Maple Hudson Low Dose of Prednisone Uses Nebs and Dulera  2.  Stage 3a chronic kidney disease (HCC) Creat staying stable  3. Essential hypertension Cozar and Metoprolol  4. Acquired hypothyroidism TSH normal in 9/24  5. Chronic diastolic congestive heart failure (HCC) On Torsemide 6. Primary osteoarthritis of left knee Pain Control Not very active anymore  7. Permanent atrial fibrillation (HCC) Xarelto  8. Sick sinus syndrome (HCC) Pacemaker  9. Gastroesophageal reflux disease, unspecified whether esophagitis present Pepcid 10 Neuropathy Gabapentin   Labs/tests ordered:  * No order type specified * Next appt:  04/14/2023

## 2023-03-17 ENCOUNTER — Ambulatory Visit: Payer: Medicare Other

## 2023-03-17 DIAGNOSIS — I495 Sick sinus syndrome: Secondary | ICD-10-CM | POA: Diagnosis not present

## 2023-03-18 LAB — CUP PACEART REMOTE DEVICE CHECK
Battery Impedance: 1274 Ohm
Battery Remaining Longevity: 58 mo
Battery Voltage: 2.77 V
Brady Statistic RV Percent Paced: 100 %
Date Time Interrogation Session: 20241209091204
Implantable Lead Connection Status: 753985
Implantable Lead Connection Status: 753985
Implantable Lead Implant Date: 20090318
Implantable Lead Implant Date: 20090318
Implantable Lead Location: 753859
Implantable Lead Location: 753860
Implantable Lead Model: 5076
Implantable Lead Model: 5092
Implantable Pulse Generator Implant Date: 20160202
Lead Channel Impedance Value: 67 Ohm
Lead Channel Impedance Value: 744 Ohm
Lead Channel Pacing Threshold Amplitude: 1.25 V
Lead Channel Pacing Threshold Pulse Width: 0.4 ms
Lead Channel Setting Pacing Amplitude: 2.5 V
Lead Channel Setting Pacing Pulse Width: 0.4 ms
Lead Channel Setting Sensing Sensitivity: 4 mV
Zone Setting Status: 755011
Zone Setting Status: 755011

## 2023-04-14 ENCOUNTER — Encounter: Payer: Self-pay | Admitting: Adult Health

## 2023-04-14 ENCOUNTER — Non-Acute Institutional Stay: Payer: Medicare Other | Admitting: Adult Health

## 2023-04-14 VITALS — BP 118/72 | HR 65 | Temp 97.0°F | Ht 63.0 in | Wt 139.0 lb

## 2023-04-14 DIAGNOSIS — E039 Hypothyroidism, unspecified: Secondary | ICD-10-CM | POA: Diagnosis not present

## 2023-04-14 DIAGNOSIS — J302 Other seasonal allergic rhinitis: Secondary | ICD-10-CM

## 2023-04-14 DIAGNOSIS — M25511 Pain in right shoulder: Secondary | ICD-10-CM | POA: Diagnosis not present

## 2023-04-14 DIAGNOSIS — G8929 Other chronic pain: Secondary | ICD-10-CM

## 2023-04-14 DIAGNOSIS — I10 Essential (primary) hypertension: Secondary | ICD-10-CM | POA: Diagnosis not present

## 2023-04-14 DIAGNOSIS — N1831 Chronic kidney disease, stage 3a: Secondary | ICD-10-CM

## 2023-04-14 DIAGNOSIS — J3089 Other allergic rhinitis: Secondary | ICD-10-CM | POA: Diagnosis not present

## 2023-04-14 DIAGNOSIS — I5032 Chronic diastolic (congestive) heart failure: Secondary | ICD-10-CM | POA: Diagnosis not present

## 2023-04-14 DIAGNOSIS — I4821 Permanent atrial fibrillation: Secondary | ICD-10-CM

## 2023-04-14 DIAGNOSIS — J449 Chronic obstructive pulmonary disease, unspecified: Secondary | ICD-10-CM | POA: Diagnosis not present

## 2023-04-14 DIAGNOSIS — M25562 Pain in left knee: Secondary | ICD-10-CM

## 2023-04-14 NOTE — Progress Notes (Signed)
 Location:  [facility]  POS: Clinic  Provider: Tawni America, ANP  Code Status: DNR Goals of Care:     04/14/2023    1:24 PM  Advanced Directives  Does Patient Have a Medical Advance Directive? Yes  Type of Advance Directive Out of facility DNR (pink MOST or yellow form)  Does patient want to make changes to medical advance directive? No - Patient declined     Chief Complaint  Patient presents with   Medical Management of Chronic Issues    Medical Management of Chronic Issues. 3 Month follow up    HPI: Patient is a 89 y.o. female seen today for medical management of chronic diseases.    [place] home care workers to support her due to vision loss.   PMH significant for pacemaker due to SSS, afib, HTN, chronic diastolic CHF, COPD, chronic bronchitis, GERD, OA, CKD, HOH, glaucoma, macular degeneration, blindness, constipation.   CHF: sleeps with elevated HOB. Has chronic sob with longer distances. No sob at rest or walking around the apt.  132-134.8 weight range with at home weights Overall has been gaining weight but is eating more and more sedentary. No change in edema.   Has right shoulder injections on the right last one in Nov with ultrasound.  Had gel injection in the left knee in the summer with mild improvement On voltaren  gel also, tylenol  to help with pain  COPD/chronic bronchitis: She is using Dulera  twice a day, also duonebs mostly twice a day. Still has some chronic sinus drainage that is unchanged. Feels a frog in her throat, clears her throat. Coughs at night.  Chronic sputum production Low dose prednisone  per Dr Neysa  Pacemaker checked in Dec doing ok.   HTN controlled  Hypothyroidism Lab Results  Component Value Date   TSH 2.30 12/31/2022   Afib: rate controlled, on xarelto  for CVA risk reduction   CKD:  Lab Results  Component Value Date   BUN 37 (A) 12/31/2022   Lab Results  Component Value Date   CREATININE 1.3 (A) 12/31/2022     Past Medical History:  Diagnosis Date   Anemia    Aortic stenosis    a. Echo 09/06/12 EF 55-60%, no WMA, G2DD, Ao valve sclerosis w/ mod stenosis, LA mildly dilated, PA pressure   Asthmatic bronchitis    Complete heart block (HCC)    s/p permanent pacemaker 06/27/1999 (Battery change 06/2007 and 2016).  s/p AV nodal ablation by Dr Kelsie 2016.   COPD (chronic obstructive pulmonary disease) (HCC)    Dr. Neysa   DDD (degenerative disc disease)    Diastolic dysfunction    a. Echo 09/06/12 EF 55-60%, no WMA, G2DD, Ao valve sclerosis w/ mod stenosis, LA mildly dilated, PA pressure   Diverticulosis    DVT (deep vein thrombosis) in pregnancy 1954   a. LLE   Hypertension    Hypothyroidism    on medication   Macular degeneration    Dr. Suzanne   Osteoarthrosis, unspecified whether generalized or localized, other specified sites    PAF (paroxysmal atrial fibrillation) (HCC)    Stopped flecainide , on amiodarone but still has bouts of A FIB (mostly in mornings).    Pure hypercholesterolemia    Staghorn calculus    Left    Past Surgical History:  Procedure Laterality Date   APPENDECTOMY  ~ 1941   AV NODE ABLATION  05/10/2014   AV NODE ABLATION N/A 05/10/2014   Procedure: AV NODE ABLATION;  Surgeon: Lynwood Rakers, MD;  Location: [facility] CATH LAB;  Service: Cardiovascular;  Laterality: N/A;   BUNIONECTOMY WITH HAMMERTOE RECONSTRUCTION Bilateral ~ 1990   CARDIAC PACEMAKER PLACEMENT  06/27/99   Medtronic PM implanted by Dr Malva   CARDIOVERSION N/A 12/02/2013   Procedure: CARDIOVERSION;  Surgeon: Vina Okey GAILS, MD;  Location: [facility] ENDOSCOPY;  Service: Cardiovascular;  Laterality: N/A;   CATARACT EXTRACTION W/ INTRAOCULAR LENS  IMPLANT, BILATERAL Bilateral    COLONOSCOPY WITH PROPOFOL  N/A 04/22/2013   Procedure: COLONOSCOPY WITH PROPOFOL ;  Surgeon: Gladis MARLA Louder, MD;  Location: [facility] ENDOSCOPY;  Service: Endoscopy;  Laterality: N/A;   DILATION AND CURETTAGE OF UTERUS  X 2   when I was going  thru menopause   ESOPHAGOGASTRODUODENOSCOPY (EGD) WITH PROPOFOL  N/A 04/22/2013   Procedure: ESOPHAGOGASTRODUODENOSCOPY (EGD) WITH PROPOFOL ;  Surgeon: Gladis MARLA Louder, MD;  Location: [facility] ENDOSCOPY;  Service: Endoscopy;  Laterality: N/A;   HERNIA REPAIR     INCISIONAL HERNIA REPAIR     INSERT / REPLACE / REMOVE PACEMAKER  06/2007   took out the old; put in new   INSERT / REPLACE / REMOVE PACEMAKER  05/10/2014   MDT PPM generator change by Dr Rakers   JOINT REPLACEMENT     PARTIAL NEPHRECTOMY Left 05/1974   stone disease   PERMANENT PACEMAKER GENERATOR CHANGE N/A 05/10/2014   Procedure: PERMANENT PACEMAKER GENERATOR CHANGE;  Surgeon: Lynwood Rakers, MD;  Location: [facility] CATH LAB;  Service: Cardiovascular;  Laterality: N/A;   TONSILLECTOMY AND ADENOIDECTOMY  1930's   TOTAL KNEE ARTHROPLASTY Right 2001    Allergies  Allergen Reactions   Combigan [Brimonidine Tartrate-Timolol] Other (See Comments)    Systemic malaise amigen eye drop   Tape Other (See Comments)    SKIN IS VERY FRAGILE!!   Penicillins Hives   Breo Ellipta  [Fluticasone  Furoate-Vilanterol] Other (See Comments) and Hypertension    Increased blood pressure   Fluticasone     Other     SEASONAL ALLERGIES   Timolol    Vancomycin  Other (See Comments)    Red man syndrome - Mild redness and discomfort. Able to complete initial dose.     Outpatient Encounter Medications as of 04/14/2023  Medication Sig   albuterol  (VENTOLIN  HFA) 108 (90 Base) MCG/ACT inhaler Inhale 2 puffs into the lungs every 6 (six) hours as needed for wheezing or shortness of breath.   Azelastine  HCl 137 MCG/SPRAY SOLN USE 1 TO 2 SPRAYS INTO BOTH NOSTRILS TWICE DAILY.   Calcium  600-10 MG-MCG CHEW Chew 1 capsule by mouth daily.   Cholecalciferol  (VITAMIN D ) 125 MCG (5000 UT) CAPS Take 1 capsule by mouth daily.   dorzolamide  (TRUSOPT ) 2 % ophthalmic solution Place 1 drop into both eyes 2 (two) times daily.   famotidine  (PEPCID ) 40 MG tablet Take 1 tablet (40 mg total) by  mouth at bedtime.   fexofenadine  (ALLEGRA ) 180 MG tablet Take 180 mg by mouth daily.   fluticasone  (FLONASE ) 50 MCG/ACT nasal spray USE 2 SPRAYS EACH NOSTRIL ONCE A DAY AS NEEDED FOR ALLERGIES OR CONGESTION.   gabapentin  (NEURONTIN ) 100 MG capsule Take 200 mg by mouth at bedtime.   guaiFENesin  (MUCINEX ) 600 MG 12 hr tablet Take 600 mg by mouth 2 (two) times daily.   ipratropium-albuterol  (DUONEB) 0.5-2.5 (3) MG/3ML SOLN Take 3 mLs by nebulization 3 (three) times daily as needed.   ipratropium-albuterol  (DUONEB) 0.5-2.5 (3) MG/3ML SOLN Take 3 mLs by nebulization at bedtime.   latanoprost  (XALATAN ) 0.005 % ophthalmic solution Place 1 drop into both eyes  at bedtime.   levothyroxine  (SYNTHROID ) 75 MCG tablet Take 1 tablet (75 mcg total) by mouth daily.   losartan  (COZAAR ) 50 MG tablet Take 1 tablet (50 mg total) by mouth daily.   Lutein  20 MG CAPS Take 1 capsule (20 mg total) by mouth daily.   Magnesium  500 MG TABS Take 750 mg by mouth See admin instructions. Take 750 mg by mouth every evening (in conjunction with 3 BioComplete capsules)   metoprolol  tartrate (LOPRESSOR ) 50 MG tablet Take 1.5 tablets (75 mg total) by mouth in the morning.   mometasone -formoterol  (DULERA ) 100-5 MCG/ACT AERO Inhale 2 puffs then rinse mouth, twice daily   montelukast  (SINGULAIR ) 10 MG tablet Take 1 tablet (10 mg total) by mouth daily.   Multiple Vitamins-Minerals (PRESERVISION AREDS 2) CAPS Take 1 capsule by mouth in the morning and at bedtime.   NON FORMULARY Take 3 capsules by mouth See admin instructions. Gundry MD Bio Complete 3 - Prebiotic, Probiotic, Postbiotic to Support Optimal Gut Health capsules- TAKE 3 CAPSULES BY MOUTH EVERY EVENING (in conjunction with 750 mg Magnesium )   potassium chloride  (KLOR-CON ) 10 MEQ tablet Take 2 tablets (20 mEq total) by mouth daily.   potassium chloride  (KLOR-CON ) 10 MEQ tablet Take 10 mEq by mouth daily.   predniSONE  (DELTASONE ) 2.5 MG tablet Take 1 tablet (2.5 mg total) by  mouth daily with breakfast.   Rivaroxaban  (XARELTO ) 15 MG TABS tablet Take 15 mg by mouth daily.   torsemide  (DEMADEX ) 20 MG tablet Take 1 tablet (20 mg total) by mouth daily as needed. 3 lb weight gain in 1 day or 5 lb in 1 week or excessive edema   torsemide  (DEMADEX ) 20 MG tablet Take 1.5 tablets (30 mg total) by mouth daily.   TYLENOL  500 MG tablet Take 1,000 mg by mouth in the morning, at noon, and at bedtime.   [DISCONTINUED] Calcium  Carb-Cholecalciferol  (CALCIUM  500/D) 500-10 MG-MCG CHEW Chew 1 tablet by mouth daily. (Patient not taking: Reported on 02/25/2023)   No facility-administered encounter medications on file as of 04/14/2023.    Review of Systems:  Review of Systems  Constitutional:  Positive for activity change (more sedentary over time.). Negative for appetite change, chills, diaphoresis, fatigue, fever and unexpected weight change.  HENT:  Positive for congestion (chronic), hearing loss and postnasal drip. Negative for sore throat and trouble swallowing.        Feels like a frog in her throat all the time.   Eyes:        Poor vision  Respiratory:  Positive for cough, shortness of breath (with exertion chronic) and wheezing (less frequent).   Cardiovascular:  Positive for leg swelling (slight chronic). Negative for chest pain and palpitations.  Gastrointestinal:  Negative for abdominal distention, abdominal pain, constipation and diarrhea.  Genitourinary:  Negative for difficulty urinating and dysuria.  Musculoskeletal:  Positive for arthralgias and gait problem. Negative for back pain, joint swelling and myalgias.       Right shoulder  Left knee  Neurological:  Negative for dizziness, tremors, seizures, syncope, facial asymmetry, speech difficulty, weakness, light-headedness, numbness and headaches.  Psychiatric/Behavioral:  Negative for agitation, behavioral problems and confusion.     Health Maintenance  Topic Date Due   COVID-19 Vaccine (8 - 2024-25 season)  03/11/2023   Medicare Annual Wellness (AWV)  03/19/2023   DTaP/Tdap/Td (2 - Td or Tdap) 02/23/2029   Pneumonia Vaccine 56+ Years old  Completed   INFLUENZA VACCINE  Completed   Zoster Vaccines- Shingrix  Completed  HPV VACCINES  Aged Out   DEXA SCAN  Discontinued    Physical Exam: Vitals:   04/14/23 1320  BP: 118/72  Pulse: 65  Temp: (!) 97 F (36.1 C)  SpO2: 99%  Weight: 139 lb (63 kg)  Height: 5' 3 (1.6 m)   Body mass index is 24.62 kg/m. Wt Readings from Last 3 Encounters:  04/14/23 139 lb (63 kg)  02/25/23 135 lb 3.2 oz (61.3 kg)  02/04/23 132 lb (59.9 kg)    Physical Exam Vitals and nursing note reviewed.  Constitutional:      General: She is not in acute distress.    Appearance: She is not diaphoretic.  HENT:     Head: Normocephalic and atraumatic.     Right Ear: Ear canal normal.     Left Ear: Ear canal normal.     Ears:     Comments: Decreased light reflex to both TMs    Nose: Nose normal.     Comments: Erythema to nasal passages    Mouth/Throat:     Mouth: Mucous membranes are moist.     Pharynx: Oropharynx is clear.     Comments: Petechial lesion above uvula Neck:     Vascular: No JVD.  Cardiovascular:     Rate and Rhythm: Normal rate and regular rhythm.     Heart sounds: No murmur heard. Pulmonary:     Effort: Pulmonary effort is normal. No respiratory distress.     Breath sounds: Normal breath sounds. No wheezing.     Comments: Decreased bases Abdominal:     General: Bowel sounds are normal. There is no distension.     Palpations: Abdomen is soft.     Tenderness: There is no abdominal tenderness.  Musculoskeletal:     Cervical back: No rigidity or tenderness.     Right lower leg: Edema (ankle) present.     Left lower leg: No edema.  Lymphadenopathy:     Cervical: No cervical adenopathy.  Skin:    General: Skin is warm and dry.  Neurological:     Mental Status: She is alert and oriented to person, place, and time.  Psychiatric:         Mood and Affect: Mood normal.    Labs reviewed: Basic Metabolic Panel: Recent Labs    04/15/22 0000 07/09/22 0000 12/31/22 0000  NA 143 141 143  K 3.6 3.9 3.7  CL 101 102 103  CO2 32* 27* 29*  BUN 29* 38* 37*  CREATININE 1.2* 1.2* 1.3*  CALCIUM  9.5 9.5 10.0  TSH  --   --  2.30   Liver Function Tests: Recent Labs    07/09/22 0000  AST 20  ALT 11  ALKPHOS 67  ALBUMIN 3.6   No results for input(s): LIPASE, AMYLASE in the last 8760 hours. No results for input(s): AMMONIA in the last 8760 hours. CBC: Recent Labs    04/15/22 0000 07/09/22 0000  WBC 10.8  10.8 8.2  NEUTROABS 8.00  --   HGB 12.0  12.0 11.4*  HCT 37  37 35*  PLT 330  330 294   Lipid Panel: No results for input(s): CHOL, HDL, LDLCALC, TRIG, CHOLHDL, LDLDIRECT in the last 8760 hours. Lab Results  Component Value Date   HGBA1C 5.7 (H) 06/25/2017    Procedures since last visit: CUP PACEART REMOTE DEVICE CHECK Result Date: 03/18/2023 Scheduled remote reviewed. Normal device function.  Permanent AF and on Xarelto  per Epic. Next remote 91 days. MC, CVRS  Assessment/Plan  1. Chronic diastolic congestive heart failure (HCC) (Primary) Her weight is trending up but does not seem to be fluid related She is more sedentary right now due to her knee issues Continue current dose of torsemide  Followed by cardiology   2. Stage 3a chronic kidney disease (HCC) Continue to periodically monitor BMP and avoid nephrotoxic agents  3. COPD mixed type (HCC) Followed by Pulmonary Doing well on low dose prednisone , Dulera , and Duonebs.   4. Seasonal and perennial allergic rhinitis Continue singulair , mucinex , flonase , astelin , allegra  Avoid frequent throat clearing, try  lozenges  5. Chronic pain of left knee Followed by ortho for injections  6. Chronic right shoulder pain Continue voltaren  gel, tylenol    7. Permanent atrial fibrillation (HCC) On xarelto   Rate is controlled on  metoprolol    8. Essential hypertension Controlled  9. Acquired hypothyroidism Continue synthroid     Labs/tests ordered:  * No order type specified *CBC BMP prior to next apt Next appt:  6 weeks    Total time :  time greater than 50% of total time spent doing pt counseling and coordination of care

## 2023-04-14 NOTE — Patient Instructions (Signed)
 Recommend throat lozenges to avoid frequent throat clearing.

## 2023-04-24 NOTE — Addendum Note (Signed)
Addended by: Geralyn Flash D on: 04/24/2023 02:59 PM   Modules accepted: Orders

## 2023-04-24 NOTE — Progress Notes (Signed)
Remote pacemaker transmission.   

## 2023-04-28 ENCOUNTER — Ambulatory Visit (HOSPITAL_BASED_OUTPATIENT_CLINIC_OR_DEPARTMENT_OTHER): Payer: Medicare Other | Admitting: Cardiology

## 2023-04-28 VITALS — BP 112/60 | HR 63 | Ht 62.0 in | Wt 138.0 lb

## 2023-04-28 DIAGNOSIS — I495 Sick sinus syndrome: Secondary | ICD-10-CM

## 2023-04-28 DIAGNOSIS — I4821 Permanent atrial fibrillation: Secondary | ICD-10-CM

## 2023-04-28 DIAGNOSIS — I5032 Chronic diastolic (congestive) heart failure: Secondary | ICD-10-CM

## 2023-04-28 DIAGNOSIS — Z950 Presence of cardiac pacemaker: Secondary | ICD-10-CM

## 2023-04-28 DIAGNOSIS — I442 Atrioventricular block, complete: Secondary | ICD-10-CM

## 2023-04-28 NOTE — Progress Notes (Signed)
Cardiology Office Note:  .   Date:  04/28/2023  ID:  Jody Blake, DOB 1925-05-06, MRN [id number] PCP: Mahlon Gammon, MD  [facility] Providers Cardiologist:  Donato Schultz, MD     History of Present Illness: [name]   Jody Blake is a 89 y.o. female Discussed with the use of AI scribe   History of Present Illness   The patient is a 89 year old individual with a history of atrial fibrillation, aortic valve stenosis, and a pacemaker. She presents for a routine follow-up. The patient's most recent echocardiogram showed a mean gradient of 27 mmHg and a dimensionless index of 0.51, indicating mild to moderate aortic valve stenosis. The patient's pacemaker is functioning well, and her cardiac function is reportedly good.  The patient's primary complaint is a respiratory issue, characterized by phlegm and congestion, particularly in the bronchial tubes. This issue is recurrent and tends to worsen during the winter months. The patient is currently managing this issue with Mucinex, taken twice daily.  The patient's medication regimen includes Xarelto for stroke prevention, losartan and metoprolol for heart health, and torsemide for fluid management. The patient also reports occasional use of additional torsemide when she notices a weight increase, which she attributes to fluid retention.  The patient denies any new symptoms or changes in her health status. She reports no fluid retention in the ankles. Despite her age and health conditions, the patient maintains a good quality of life and remains mentally sharp.          ROS: +cough, mucus  Studies Reviewed: .        Results   Echocardiogram: Mean gradient 27 mmHg, dimensionless index 0.51 (04/12/2021)     Risk Assessment/Calculations:            Physical Exam:   VS:  BP 112/60 (BP Location: Left Arm, Patient Position: Sitting, Cuff Size: Normal)   Pulse 63   Ht 5\' 2"  (1.575 m)   Wt 138 lb (62.6 kg)   LMP 04/08/1974   SpO2  95%   BMI 25.24 kg/m    Wt Readings from Last 3 Encounters:  04/28/23 138 lb (62.6 kg)  04/14/23 139 lb (63 kg)  02/25/23 135 lb 3.2 oz (61.3 kg)    GEN: Well nourished, well developed in no acute distress NECK: No JVD; No carotid bruits CARDIAC: RRR, 2/6 SM, no rubs, no gallops RESPIRATORY:  Clear to auscultation without rales, wheezing or rhonchi  ABDOMEN: Soft, non-tender, non-distended EXTREMITIES:  No edema; No deformity   ASSESSMENT AND PLAN: .    Assessment and Plan    Aortic Valve Stenosis Mild to moderate aortic valve stenosis with echocardiogram on 04/12/2021 showing mean gradient of 27 mmHg and dimensionless index of 0.51. Condition is well-managed, no immediate intervention required. - Continue current management - Follow up in one year  Permanent Atrial Fibrillation Permanent atrial fibrillation managed with Xarelto for stroke prevention. Pacemaker functioning well. Emphasized importance of continuing Xarelto to reduce stroke risk. - Continue Xarelto - Continue pacemaker monitoring  Heart Failure Heart failure managed with losartan, metoprolol, and torsemide. No signs of fluid retention. Patient monitors weight daily and adjusts torsemide as needed. Discussed importance of weight monitoring for fluid management. - Continue losartan, metoprolol, and torsemide - Monitor daily weight - Adjust torsemide if weight increases  Chronic Bronchitis Chronic bronchitis with seasonal exacerbations. Currently experiencing phlegm and bronchial congestion. No crackles or wheezes on examination. Continue Mucinex for symptom management. - Continue Mucinex twice daily  General Health Maintenance  - Continue current health maintenance regimen  Follow-up - EP/ Dr. Ladona Ridgel - Follow up in one year.               Signed, Donato Schultz, MD

## 2023-04-28 NOTE — Patient Instructions (Signed)
Medication Instructions:  Your physician recommends that you continue on your current medications as directed. Please refer to the Current Medication list given to you today.  *If you need a refill on your cardiac medications before your next appointment, please call your pharmacy*  Lab Work: NONE  Testing/Procedures: NONE  Follow-Up: At [facility], you and your health needs are our priority.  As part of our continuing mission to provide you with exceptional heart care, we have created designated Provider Care Teams.  These Care Teams include your primary Cardiologist (physician) and Advanced Practice Providers (APPs -  Physician Assistants and Nurse Practitioners) who all work together to provide you with the care you need, when you need it.  We recommend signing up for the patient portal called "MyChart".  Sign up information is provided on this After Visit Summary.  MyChart is used to connect with patients for Virtual Visits (Telemedicine).  Patients are able to view lab/test results, encounter notes, upcoming appointments, etc.  Non-urgent messages can be sent to your provider as well.   To learn more about what you can do with MyChart, go to [email or web address].    Your next appointment:   12 month(s)  The format for your next appointment:   In Person  Provider:   Dr Anne Fu

## 2023-05-02 NOTE — Progress Notes (Unsigned)
Subjective:    Patient ID: Jody Blake, female    DOB: 03/30/1926, 89 y.o.   MRN: [id number]  HPI F never smoker, followed for allergic rhinitis, chronic bronchitis, complicated by GERD, Hx PAfib/ pacemaker. ------------------------------------------------------------------------------------------------   10/28/22-  89 year old female never smoker followed for Allergic rhinitis, COPD mixed type, complicated by GERD,  AFib/pacemaker/ Xarelto, Glaucoma/ macular degen, dCHF, CHB/ pacemaker, AoStenosis, CKDIII, TIA      Living at [facility] -Singulair, Ventolin hfa, Dulera 100, Neb Xopenex,  flonase, astelin, prednisone 2.5 mg daily, Pepcid,Gabapentin 100 qhs, Neb Xopenex, -----Doing well She and her assistant indicate she has been fairly stable recently. There was concern last time about whether maintenance steroids were potentiating recurrent respiratory infections. Maintenance pred reduced to 2.5 mg daily. Complete removal might set up adrenal insufficiency after this long on therapy. We will watch now for stability over time. Discussed CT.  HRCT chest 07/08/22- IMPRESSION: 1. No evidence of interstitial lung disease. 2. No suspicious pulmonary nodules. 3. Tiny left pleural effusion with adjacent subpleural volume loss in the left lower lobe. 4. Aortic atherosclerosis (ICD10-I70.0). Coronary artery calcification. 5. Enlarged pulmonic trunk, indicative of pulmonary arterial hypertension.  05/05/23- 89 year old female never smoker followed for Allergic rhinitis, COPD mixed type, complicated by GERD,  AFib/pacemaker/ Xarelto, Post Covid DVT 2024, Glaucoma/ macular degen, dCHF, CHB/ pacemaker, AoStenosis, CKDIII, TIA      Living at [facility] -Singulair, Ventolin hfa, Dulera 100, Neb Xopenex,  flonase, astelin, prednisone 2.5 mg daily, Pepcid,Gabapentin 100 qhs, Neb Xopenex, Care aide is here. Discussed the use of AI scribe software for clinical note transcription with the patient, who gave  verbal consent to proceed.  History of Present Illness   The patient, with a history of chronic respiratory disease, presents with a two-week history of worsening congestion and cough. She describes the phlegm as 'thick stuff like glue.' Despite recent visits to a cardiologist and a nurse, both of whom found no abnormalities, the patient reports feeling unwell. She denies fever but admits to occasional shortness of breath and a sensation of tightness in the chest and sinuses. The patient's symptoms have been severe enough to cause concern about her ability to 'make it' through the night due to the volume of phlegm produced. She has not received any antibiotics for this current episode. The patient also has a history of prednisone use, which was recently reduced from 5mg  to 2.5mg  daily. She has been using a nebulizer as needed, with some caregivers administering it automatically twice a day. The patient also has a rescue inhaler for use when experiencing wheezing or shortness of breath. She is without her hearing aid today,  but caregiver helped with communication. Caregiver noted that torsemide diuretic is being dosed daily. That will be managed by cardiology.     ROS-see HPI   + = positive Constitutional:    weight loss, night sweats, fevers, chills, fatigue, lassitude. HEENT:    headaches, difficulty swallowing, tooth/dental problems, sore throat,       sneezing, itching, ear ache, nasal congestion, +post nasal drip, snoring CV:    chest pain, orthopnea, PND, swelling in lower extremities, anasarca,                                                     dizziness, palpitations Resp:   shortness of breath with exertion  or at rest.                 +productive cough,   +non-productive cough, coughing up of blood.              change in color of mucus.  +wheezing.   Skin:    rash or lesions. GI: +  heartburn, indigestion, abdominal pain, nausea, vomiting,  GU:  MS:   joint pain, stiffness,  Neuro-      nothing unusual Psych:  change in mood or affect.  depression or anxiety.   memory loss.  Objective:  OBJ- Physical Exam General- Alert, Oriented, Affect-appropriate, Distress- none acute, + slender Skin- rash-none, lesions- none, excoriation- none Lymphadenopathy- none Head- atraumatic            Eyes- Gross vision intact, PERRLA, conjunctivae and secretions clear            Ears- + Hard of hearing            Nose- Clear, no-Septal dev, mucus, polyps, erosion, perforation             Throat- Mallampati II , mucosa clear , drainage- none, tonsils- atrophic Neck- flexible , trachea midline, no stridor , thyroid nl, carotid no bruit Chest - symmetrical excursion , unlabored           Heart/CV- RRR , no murmur , no gallop  , no rub, nl s1 s2                           - JVD- none , edema-,  stasis changes- none, varices- none           Lung-  +few rhonchi, Wheeze -none, cough- , dullness -none, rub- none           Chest wall-+ left pacemaker.  Abd-   Br/ Gen/ Rectal- Not done, not indicated Extrem- +rolling walker Neuro- grossly intact to observation    Assessment and Plan    Respiratory Infection Persistent cough, congestion, and shortness of breath for two weeks. No fever reported. No improvement with Mucinex. No significant findings on physical examination. -Start Doxycycline. -Increase Prednisone to 10mg  daily for one week, then decrease to 5mg  daily.  Chronic Obstructive Pulmonary Disease (COPD) Use of nebulizer and rescue inhaler. Experiencing wheezing with exertion and increased coughing in the morning. -Continue use of nebulizer up to four times daily as needed. -Use nebulizer treatment in the morning when experiencing increased coughing and wheezing. May use nebulizer up to 4 times daily if needed.  Fluid Retention On Torsemide 30mg  daily with additional 20mg  as needed. No significant edema noted on physical examination. -Continue current regimen. Monitor for signs of fluid  overload and adjust Torsemide dose as needed.       Assessment & Plan:

## 2023-05-05 ENCOUNTER — Encounter: Payer: Self-pay | Admitting: Internal Medicine

## 2023-05-05 ENCOUNTER — Ambulatory Visit (INDEPENDENT_AMBULATORY_CARE_PROVIDER_SITE_OTHER): Payer: Medicare Other | Admitting: Internal Medicine

## 2023-05-05 VITALS — BP 123/76 | HR 66 | Temp 98.2°F | Resp 18 | Ht 63.0 in | Wt 141.6 lb

## 2023-05-05 DIAGNOSIS — J209 Acute bronchitis, unspecified: Secondary | ICD-10-CM

## 2023-05-05 DIAGNOSIS — H449 Unspecified disorder of globe: Secondary | ICD-10-CM

## 2023-05-05 MED ORDER — DOXYCYCLINE HYCLATE 100 MG PO TABS: 100.0000 mg | ORAL_TABLET | Freq: Two times a day (BID) | ORAL | 0 refills | Status: DC

## 2023-05-05 MED ORDER — PREDNISONE 5 MG PO TABS: ORAL_TABLET | ORAL | 5 refills | Status: DC

## 2023-05-05 NOTE — Patient Instructions (Signed)
Recommend- doxycycline 100 mg, twice daily x 7 days                       Increase prednisone to 10 mg daily x 7 days, then 5 mg daily  Ok to use nebulizer machine up to 4 times daily if needed  Please call if we can help

## 2023-05-06 ENCOUNTER — Telehealth: Payer: Self-pay | Admitting: Internal Medicine

## 2023-05-06 NOTE — Telephone Encounter (Signed)
Patient needs IPRATROPIUM/SOL ALBUTEROL SULFATE 3 MG sent  to [facility] in [place]

## 2023-05-08 ENCOUNTER — Other Ambulatory Visit (HOSPITAL_BASED_OUTPATIENT_CLINIC_OR_DEPARTMENT_OTHER): Payer: Self-pay

## 2023-05-08 MED ORDER — IPRATROPIUM-ALBUTEROL 0.5-2.5 (3) MG/3ML IN SOLN: 3.0000 mL | Freq: Four times a day (QID) | RESPIRATORY_TRACT | 5 refills | Status: DC | PRN

## 2023-05-08 NOTE — Telephone Encounter (Signed)
Refill sent.

## 2023-05-12 ENCOUNTER — Ambulatory Visit (INDEPENDENT_AMBULATORY_CARE_PROVIDER_SITE_OTHER): Payer: Medicare Other | Admitting: Adult Health

## 2023-05-12 ENCOUNTER — Encounter: Payer: Self-pay | Admitting: Adult Health

## 2023-05-12 ENCOUNTER — Ambulatory Visit (HOSPITAL_COMMUNITY)
Admission: RE | Admit: 2023-05-12 | Discharge: 2023-05-12 | Disposition: A | Discharge status: Home / Self Care | Payer: Medicare Other | Source: Ambulatory Visit | Attending: Adult Health | Admitting: Adult Health

## 2023-05-12 VITALS — BP 124/88 | HR 60 | Temp 97.3°F | Resp 18 | Ht 63.0 in | Wt 141.5 lb

## 2023-05-12 DIAGNOSIS — E039 Hypothyroidism, unspecified: Secondary | ICD-10-CM

## 2023-05-12 DIAGNOSIS — M79605 Pain in left leg: Secondary | ICD-10-CM | POA: Diagnosis not present

## 2023-05-12 DIAGNOSIS — G629 Polyneuropathy, unspecified: Secondary | ICD-10-CM

## 2023-05-12 DIAGNOSIS — I825Y2 Chronic embolism and thrombosis of unspecified deep veins of left proximal lower extremity: Secondary | ICD-10-CM

## 2023-05-12 DIAGNOSIS — I10 Essential (primary) hypertension: Secondary | ICD-10-CM

## 2023-05-12 DIAGNOSIS — I4821 Permanent atrial fibrillation: Secondary | ICD-10-CM | POA: Diagnosis not present

## 2023-05-12 DIAGNOSIS — I129 Hypertensive chronic kidney disease with stage 1 through stage 4 chronic kidney disease, or unspecified chronic kidney disease: Secondary | ICD-10-CM

## 2023-05-12 NOTE — Progress Notes (Unsigned)
[facility] clinic  Provider:   Code Status: *** Goals of Care:     04/14/2023    1:24 PM  Advanced Directives  Does Patient Have a Medical Advance Directive? Yes  Type of Advance Directive Out of facility DNR (pink MOST or yellow form)  Does patient want to make changes to medical advance directive? No - Patient declined     Chief Complaint  Patient presents with   Acute Visit    left leg pain    HPI: Patient is a 89 y.o. female seen today for an acute visit for With caregiver, sitting on wheelchair Left chest pacemaker   Past Medical History:  Diagnosis Date   Anemia    Aortic stenosis    a. Echo 09/06/12 EF 55-60%, no WMA, G2DD, Ao valve sclerosis w/ mod stenosis, LA mildly dilated, PA pressure   Asthmatic bronchitis    Complete heart block (HCC)    s/p permanent pacemaker 06/27/1999 (Battery change 06/2007 and 2016).  s/p AV nodal ablation by Dr Johney Frame 2016.   COPD (chronic obstructive pulmonary disease) (HCC)    Dr. Maple Hudson   DDD (degenerative disc disease)    Diastolic dysfunction    a. Echo 09/06/12 EF 55-60%, no WMA, G2DD, Ao valve sclerosis w/ mod stenosis, LA mildly dilated, PA pressure   Diverticulosis    DVT (deep vein thrombosis) in pregnancy 1954   a. LLE   Hypertension    Hypothyroidism    on medication   Macular degeneration    Dr. Jesusita Oka   Osteoarthrosis, unspecified whether generalized or localized, other specified sites    PAF (paroxysmal atrial fibrillation) (HCC)    Stopped flecainide, on amiodarone but still has bouts of A FIB (mostly in mornings).    Pure hypercholesterolemia    Staghorn calculus    Left    Past Surgical History:  Procedure Laterality Date   APPENDECTOMY  ~ 1941   AV NODE ABLATION  05/10/2014   AV NODE ABLATION N/A 05/10/2014   Procedure: AV NODE ABLATION;  Surgeon: Hillis Range, MD;  Location: [facility] CATH LAB;  Service: Cardiovascular;  Laterality: N/A;   BUNIONECTOMY WITH HAMMERTOE RECONSTRUCTION Bilateral ~ 1990   CARDIAC  PACEMAKER PLACEMENT  06/27/99   Medtronic PM implanted by Dr Amil Amen   CARDIOVERSION N/A 12/02/2013   Procedure: CARDIOVERSION;  Surgeon: Pricilla Riffle, MD;  Location: [facility] ENDOSCOPY;  Service: Cardiovascular;  Laterality: N/A;   CATARACT EXTRACTION W/ INTRAOCULAR LENS  IMPLANT, BILATERAL Bilateral    COLONOSCOPY WITH PROPOFOL N/A 04/22/2013   Procedure: COLONOSCOPY WITH PROPOFOL;  Surgeon: Charolett Bumpers, MD;  Location: [facility] ENDOSCOPY;  Service: Endoscopy;  Laterality: N/A;   DILATION AND CURETTAGE OF UTERUS  X 2   "when I was going thru menopause"   ESOPHAGOGASTRODUODENOSCOPY (EGD) WITH PROPOFOL N/A 04/22/2013   Procedure: ESOPHAGOGASTRODUODENOSCOPY (EGD) WITH PROPOFOL;  Surgeon: Charolett Bumpers, MD;  Location: [facility] ENDOSCOPY;  Service: Endoscopy;  Laterality: N/A;   HERNIA REPAIR     INCISIONAL HERNIA REPAIR     INSERT / REPLACE / REMOVE PACEMAKER  06/2007   "took out the old; put in new"   INSERT / REPLACE / REMOVE PACEMAKER  05/10/2014   MDT PPM generator change by Dr Johney Frame   JOINT REPLACEMENT     PARTIAL NEPHRECTOMY Left 05/1974   stone disease   PERMANENT PACEMAKER GENERATOR CHANGE N/A 05/10/2014   Procedure: PERMANENT PACEMAKER GENERATOR CHANGE;  Surgeon: Hillis Range, MD;  Location: [facility] CATH LAB;  Service: Cardiovascular;  Laterality: N/A;   TONSILLECTOMY AND ADENOIDECTOMY  1930's   TOTAL KNEE ARTHROPLASTY Right 2001    Allergies  Allergen Reactions   Combigan [Brimonidine Tartrate-Timolol] Other (See Comments)    Systemic malaise amigen eye drop   Tape Other (See Comments)    SKIN IS VERY FRAGILE!!   Penicillins Hives   Breo Ellipta [Fluticasone Furoate-Vilanterol] Other (See Comments) and Hypertension    Increased blood pressure   Fluticasone    Other     SEASONAL ALLERGIES   Timolol    Vancomycin Other (See Comments)    Red man syndrome - Mild redness and discomfort. Able to complete initial dose.     Outpatient Encounter Medications as of 05/12/2023  Medication Sig    albuterol (VENTOLIN HFA) 108 (90 Base) MCG/ACT inhaler Inhale 2 puffs into the lungs every 6 (six) hours as needed for wheezing or shortness of breath.   Azelastine HCl 137 MCG/SPRAY SOLN USE 1 TO 2 SPRAYS INTO BOTH NOSTRILS TWICE DAILY.   Calcium 600-10 MG-MCG CHEW Chew 1 capsule by mouth daily.   Cholecalciferol (VITAMIN D) 125 MCG (5000 UT) CAPS Take 1 capsule by mouth daily.   dorzolamide (TRUSOPT) 2 % ophthalmic solution Place 1 drop into both eyes 2 (two) times daily.   doxycycline (VIBRA-TABS) 100 MG tablet Take 1 tablet (100 mg total) by mouth 2 (two) times daily.   famotidine (PEPCID) 40 MG tablet Take 1 tablet (40 mg total) by mouth at bedtime.   fexofenadine (ALLEGRA) 180 MG tablet Take 180 mg by mouth daily.   fluticasone (FLONASE) 50 MCG/ACT nasal spray USE 2 SPRAYS EACH NOSTRIL ONCE A DAY AS NEEDED FOR ALLERGIES OR CONGESTION.   gabapentin (NEURONTIN) 100 MG capsule Take 200 mg by mouth at bedtime.   guaiFENesin (MUCINEX) 600 MG 12 hr tablet Take 600 mg by mouth daily. And prn   ipratropium-albuterol (DUONEB) 0.5-2.5 (3) MG/3ML SOLN Take 3 mLs by nebulization at bedtime.   ipratropium-albuterol (DUONEB) 0.5-2.5 (3) MG/3ML SOLN Take 3 mLs by nebulization every 6 (six) hours as needed.   latanoprost (XALATAN) 0.005 % ophthalmic solution Place 1 drop into both eyes at bedtime.   levothyroxine (SYNTHROID) 75 MCG tablet Take 1 tablet (75 mcg total) by mouth daily.   losartan (COZAAR) 50 MG tablet Take 1 tablet (50 mg total) by mouth daily.   Lutein 20 MG CAPS Take 1 capsule (20 mg total) by mouth daily.   Magnesium 500 MG TABS Take 750 mg by mouth See admin instructions. Take 750 mg by mouth every evening (in conjunction with 3 BioComplete capsules)   metoprolol tartrate (LOPRESSOR) 50 MG tablet Take 1.5 tablets (75 mg total) by mouth in the morning.   mometasone-formoterol (DULERA) 100-5 MCG/ACT AERO Inhale 2 puffs then rinse mouth, twice daily   montelukast (SINGULAIR) 10 MG tablet  Take 1 tablet (10 mg total) by mouth daily.   Multiple Vitamins-Minerals (PRESERVISION AREDS 2) CAPS Take 1 capsule by mouth in the morning and at bedtime.   NON FORMULARY Take 3 capsules by mouth See admin instructions. Gundry MD Bio Complete 3 - Prebiotic, Probiotic, Postbiotic to Support Optimal Gut Health capsules- TAKE 3 CAPSULES BY MOUTH EVERY EVENING (in conjunction with 750 mg Magnesium)   potassium chloride (KLOR-CON) 10 MEQ tablet Take 2 tablets (20 mEq total) by mouth daily.   potassium chloride (KLOR-CON) 10 MEQ tablet Take 10 mEq by mouth daily.   predniSONE (DELTASONE) 2.5 MG tablet Take 1 tablet (2.5 mg total)  by mouth daily with breakfast.   predniSONE (DELTASONE) 5 MG tablet 2 daily x 7 days, then one daily   Rivaroxaban (XARELTO) 15 MG TABS tablet Take 15 mg by mouth daily.   torsemide (DEMADEX) 20 MG tablet Take 1 tablet (20 mg total) by mouth daily as needed. 3 lb weight gain in 1 day or 5 lb in 1 week or excessive edema   torsemide (DEMADEX) 20 MG tablet Take 1.5 tablets (30 mg total) by mouth daily.   TYLENOL 500 MG tablet Take 1,000 mg by mouth in the morning, at noon, and at bedtime.   No facility-administered encounter medications on file as of 05/12/2023.    Review of Systems:  Review of Systems  Constitutional:  Negative for appetite change, chills, fatigue and fever.  HENT:  Negative for congestion, hearing loss, rhinorrhea and sore throat.   Eyes: Negative.   Respiratory:  Negative for cough, shortness of breath and wheezing.   Cardiovascular:  Negative for chest pain, palpitations and leg swelling.  Gastrointestinal:  Negative for abdominal pain, constipation, diarrhea, nausea and vomiting.  Genitourinary:  Negative for dysuria.  Musculoskeletal:  Negative for arthralgias, back pain and myalgias.  Skin:  Negative for color change, rash and wound.  Neurological:  Negative for dizziness, weakness and headaches.  Psychiatric/Behavioral:  Negative for behavioral  problems. The patient is not nervous/anxious.     Health Maintenance  Topic Date Due   COVID-19 Vaccine (8 - 2024-25 season) 03/11/2023   Medicare Annual Wellness (AWV)  03/19/2023   DTaP/Tdap/Td (2 - Td or Tdap) 02/23/2029   Pneumonia Vaccine 75+ Years old  Completed   INFLUENZA VACCINE  Completed   Zoster Vaccines- Shingrix  Completed   HPV VACCINES  Aged Out   DEXA SCAN  Discontinued    Physical Exam: There were no vitals filed for this visit. There is no height or weight on file to calculate BMI. Physical Exam Constitutional:      Appearance: Normal appearance.  HENT:     Head: Normocephalic and atraumatic.     Nose: Nose normal.     Mouth/Throat:     Mouth: Mucous membranes are moist.  Eyes:     Conjunctiva/sclera: Conjunctivae normal.  Cardiovascular:     Rate and Rhythm: Normal rate and regular rhythm.  Pulmonary:     Effort: Pulmonary effort is normal.     Breath sounds: Normal breath sounds.  Abdominal:     General: Bowel sounds are normal.     Palpations: Abdomen is soft.  Musculoskeletal:        General: Normal range of motion.     Cervical back: Normal range of motion.  Skin:    General: Skin is warm and dry.  Neurological:     General: No focal deficit present.     Mental Status: She is alert and oriented to person, place, and time.  Psychiatric:        Mood and Affect: Mood normal.        Behavior: Behavior normal.        Thought Content: Thought content normal.        Judgment: Judgment normal.     Labs reviewed: Basic Metabolic Panel: Recent Labs    07/09/22 0000 12/31/22 0000  NA 141 143  K 3.9 3.7  CL 102 103  CO2 27* 29*  BUN 38* 37*  CREATININE 1.2* 1.3*  CALCIUM 9.5 10.0  TSH  --  2.30   Liver Function Tests: Recent  Labs    07/09/22 0000  AST 20  ALT 11  ALKPHOS 67  ALBUMIN 3.6   No results for input(s): "LIPASE", "AMYLASE" in the last 8760 hours. No results for input(s): "AMMONIA" in the last 8760 hours. CBC: Recent  Labs    07/09/22 0000  WBC 8.2  HGB 11.4*  HCT 35*  PLT 294   Lipid Panel: No results for input(s): "CHOL", "HDL", "LDLCALC", "TRIG", "CHOLHDL", "LDLDIRECT" in the last 8760 hours. Lab Results  Component Value Date   HGBA1C 5.7 (H) 06/25/2017    Procedures since last visit: No results found.  Assessment/Plan    Labs/tests ordered:  CBC, BMP, venous ultrasound of LLE stat  Next appt:  06/02/2023

## 2023-05-13 LAB — CBC WITH DIFFERENTIAL/PLATELET
Absolute Lymphocytes: 1154 {cells}/uL (ref 850–3900)
Absolute Monocytes: 224 {cells}/uL (ref 200–950)
Basophils Absolute: 56 {cells}/uL (ref 0–200)
Basophils Relative: 0.5 %
Eosinophils Absolute: 34 {cells}/uL (ref 15–500)
Eosinophils Relative: 0.3 %
HCT: 37.9 % (ref 35.0–45.0)
Hemoglobin: 12.2 g/dL (ref 11.7–15.5)
MCH: 30.6 pg (ref 27.0–33.0)
MCHC: 32.2 g/dL (ref 32.0–36.0)
MCV: 95 fL (ref 80.0–100.0)
MPV: 9.6 fL (ref 7.5–12.5)
Monocytes Relative: 2 %
Neutro Abs: 9733 {cells}/uL — ABNORMAL HIGH (ref 1500–7800)
Neutrophils Relative %: 86.9 %
Platelets: 432 10*3/uL — ABNORMAL HIGH (ref 140–400)
RBC: 3.99 10*6/uL (ref 3.80–5.10)
RDW: 13.6 % (ref 11.0–15.0)
Total Lymphocyte: 10.3 %
WBC: 11.2 10*3/uL — ABNORMAL HIGH (ref 3.8–10.8)

## 2023-05-13 LAB — BASIC METABOLIC PANEL WITH GFR
BUN/Creatinine Ratio: 23 (calc) — ABNORMAL HIGH (ref 6–22)
BUN: 38 mg/dL — ABNORMAL HIGH (ref 7–25)
CO2: 29 mmol/L (ref 20–32)
Calcium: 10.2 mg/dL (ref 8.6–10.4)
Chloride: 101 mmol/L (ref 98–110)
Creat: 1.68 mg/dL — ABNORMAL HIGH (ref 0.60–0.95)
Glucose, Bld: 136 mg/dL (ref 65–139)
Potassium: 4.6 mmol/L (ref 3.5–5.3)
Sodium: 141 mmol/L (ref 135–146)
eGFR: 28 mL/min/{1.73_m2} — ABNORMAL LOW (ref >=60)

## 2023-05-13 NOTE — Progress Notes (Signed)
-    left lower extremity venous ultrasound showed chronic dvt, no new dvt, continue current Xarelto -  GFR 28, down from 42 (taken 07/09/22), will need to be repeated in 1 week -  hgb 12.2, normal, no anemia -  wbc 11.2, slightly elevated due to Prednisone

## 2023-05-20 DIAGNOSIS — N1831 Chronic kidney disease, stage 3a: Secondary | ICD-10-CM | POA: Diagnosis not present

## 2023-05-20 LAB — BASIC METABOLIC PANEL
BUN: 36 — AB (ref 4–21)
CO2: 29 — AB (ref 13–22)
Chloride: 102 (ref 99–108)
Creatinine: 1.4 — AB (ref 0.5–1.1)
Glucose: 89
Potassium: 3.9 meq/L (ref 3.5–5.1)
Sodium: 141 (ref 137–147)

## 2023-05-20 LAB — COMPREHENSIVE METABOLIC PANEL
Calcium: 9.5 (ref 8.7–10.7)
eGFR: 36

## 2023-05-21 ENCOUNTER — Encounter: Payer: Self-pay | Admitting: Internal Medicine

## 2023-05-29 DIAGNOSIS — M1712 Unilateral primary osteoarthritis, left knee: Secondary | ICD-10-CM | POA: Diagnosis not present

## 2023-06-02 ENCOUNTER — Encounter: Payer: Self-pay | Admitting: Adult Health

## 2023-06-02 ENCOUNTER — Non-Acute Institutional Stay: Payer: Medicare Other | Admitting: Adult Health

## 2023-06-02 VITALS — BP 118/78 | HR 62 | Temp 97.9°F | Resp 17 | Ht 63.0 in | Wt 135.0 lb

## 2023-06-02 DIAGNOSIS — G8929 Other chronic pain: Secondary | ICD-10-CM

## 2023-06-02 DIAGNOSIS — I10 Essential (primary) hypertension: Secondary | ICD-10-CM

## 2023-06-02 DIAGNOSIS — J449 Chronic obstructive pulmonary disease, unspecified: Secondary | ICD-10-CM | POA: Diagnosis not present

## 2023-06-02 DIAGNOSIS — M25562 Pain in left knee: Secondary | ICD-10-CM | POA: Diagnosis not present

## 2023-06-02 DIAGNOSIS — I825Y2 Chronic embolism and thrombosis of unspecified deep veins of left proximal lower extremity: Secondary | ICD-10-CM

## 2023-06-02 DIAGNOSIS — I5032 Chronic diastolic (congestive) heart failure: Secondary | ICD-10-CM | POA: Diagnosis not present

## 2023-06-02 DIAGNOSIS — N1831 Chronic kidney disease, stage 3a: Secondary | ICD-10-CM

## 2023-06-02 MED ORDER — PREDNISONE 2.5 MG PO TABS: 5.0000 mg | ORAL_TABLET | Freq: Every day | ORAL | 5 refills | Status: DC

## 2023-06-02 MED ORDER — IPRATROPIUM-ALBUTEROL 0.5-2.5 (3) MG/3ML IN SOLN: 3.0000 mL | Freq: Four times a day (QID) | RESPIRATORY_TRACT | 5 refills | Status: DC | PRN

## 2023-06-02 NOTE — Progress Notes (Signed)
 Location:  [facility]  POS: Clinic  Provider: Fletcher Anon, ANP  Code Status: DNR Goals of Care:     06/02/2023   12:59 PM  Advanced Directives  Does Patient Have a Medical Advance Directive? Yes  Type of Advance Directive Out of facility DNR (pink MOST or yellow form)  Does patient want to make changes to medical advance directive? No - Patient declined     Chief Complaint  Patient presents with   Medical Management of Chronic Issues    Patient is being seen for a 6 week follow up  Patient is due for a Awv     HPI: Patient is a 89 y.o. female seen today for medical management of chronic diseases.    [place] care workers to support her due to vision loss.   PMH significant for pacemaker due to SSS, afib, HTN, chronic diastolic CHF, COPD, chronic bronchitis, GERD, OA, CKD, HOH, glaucoma, macular degeneration, blindness, constipation.   Seen for LLE pain and venous doppler ordered 05/12/23 RIGHT:  - No evidence of common femoral vein obstruction.  LEFT:  Findings consistent with age indeterminate intramuscular thrombus  involving the left gastrocnemius veins.   She reports respiratory issues who presents with increased morning congestion and phlegm production. She is accompanied by a caregiver.  Over the past several weeks, she has experienced increased congestion and phlegm production in the mornings. The phlegm is clear and thick, improving as the day progresses. She uses her nebulizer treatment more frequently in the mornings, which she had not done before. Shortness of breath occurs primarily in the morning, with occasional episodes during the day. Her caregiver notes increased wheezing and chest tightness in the mornings, which resolves with nebulizer treatment. She uses Duoneb (ipratropium and albuterol) and Dulera inhaler twice daily.  She has a history of blood clots in her leg and is currently on Xarelto. A recent scan showed an old blood clot, but  no new clots. She continues to take Xarelto for her heart condition as well. She reports easy bruising and swelling in her legs, particularly in one area that has been more swollen over the past week. She elevates her legs using a wedge and recliner at home, although there have been variations in how caregivers position her legs. Her weight at home is 135 pounds, which is typically about four pounds less than when weighed at the clinic. She has not experienced significant swelling in her legs recently.  She received a gel injection in her left knee last Thursday, which has significantly reduced her knee pain. She has not been wearing a knee brace recently due to concerns about a blood clot. Her knee has buckled a couple of times, but she feels it is better without the brace.  Pacemaker checked in Dec doing ok.   HTN controlled  Hypothyroidism Lab Results  Component Value Date   TSH 2.30 12/31/2022   Afib: rate controlled, on xarelto for CVA risk reduction   CKD:  Lab Results  Component Value Date   BUN 36 (A) 05/20/2023   Lab Results  Component Value Date   CREATININE 1.4 (A) 05/20/2023    Past Medical History:  Diagnosis Date   Anemia    Aortic stenosis    a. Echo 09/06/12 EF 55-60%, no WMA, G2DD, Ao valve sclerosis w/ mod stenosis, LA mildly dilated, PA pressure   Asthmatic bronchitis    Complete heart block (HCC)    s/p permanent pacemaker 06/27/1999 (  Battery change 06/2007 and 2016).  s/p AV nodal ablation by Dr Johney Frame 2016.   COPD (chronic obstructive pulmonary disease) (HCC)    Dr. Maple Hudson   DDD (degenerative disc disease)    Diastolic dysfunction    a. Echo 09/06/12 EF 55-60%, no WMA, G2DD, Ao valve sclerosis w/ mod stenosis, LA mildly dilated, PA pressure   Diverticulosis    DVT (deep vein thrombosis) in pregnancy 1954   a. LLE   Hypertension    Hypothyroidism    on medication   Macular degeneration    Dr. Jesusita Oka   Osteoarthrosis, unspecified whether  generalized or localized, other specified sites    PAF (paroxysmal atrial fibrillation) (HCC)    Stopped flecainide, on amiodarone but still has bouts of A FIB (mostly in mornings).    Pure hypercholesterolemia    Staghorn calculus    Left    Past Surgical History:  Procedure Laterality Date   APPENDECTOMY  ~ 1941   AV NODE ABLATION  05/10/2014   AV NODE ABLATION N/A 05/10/2014   Procedure: AV NODE ABLATION;  Surgeon: Hillis Range, MD;  Location: [facility] CATH LAB;  Service: Cardiovascular;  Laterality: N/A;   BUNIONECTOMY WITH HAMMERTOE RECONSTRUCTION Bilateral ~ 1990   CARDIAC PACEMAKER PLACEMENT  06/27/99   Medtronic PM implanted by Dr Amil Amen   CARDIOVERSION N/A 12/02/2013   Procedure: CARDIOVERSION;  Surgeon: Pricilla Riffle, MD;  Location: [facility] ENDOSCOPY;  Service: Cardiovascular;  Laterality: N/A;   CATARACT EXTRACTION W/ INTRAOCULAR LENS  IMPLANT, BILATERAL Bilateral    COLONOSCOPY WITH PROPOFOL N/A 04/22/2013   Procedure: COLONOSCOPY WITH PROPOFOL;  Surgeon: Charolett Bumpers, MD;  Location: [facility] ENDOSCOPY;  Service: Endoscopy;  Laterality: N/A;   DILATION AND CURETTAGE OF UTERUS  X 2   "when I was going thru menopause"   ESOPHAGOGASTRODUODENOSCOPY (EGD) WITH PROPOFOL N/A 04/22/2013   Procedure: ESOPHAGOGASTRODUODENOSCOPY (EGD) WITH PROPOFOL;  Surgeon: Charolett Bumpers, MD;  Location: [facility] ENDOSCOPY;  Service: Endoscopy;  Laterality: N/A;   HERNIA REPAIR     INCISIONAL HERNIA REPAIR     INSERT / REPLACE / REMOVE PACEMAKER  06/2007   "took out the old; put in new"   INSERT / REPLACE / REMOVE PACEMAKER  05/10/2014   MDT PPM generator change by Dr Johney Frame   JOINT REPLACEMENT     PARTIAL NEPHRECTOMY Left 05/1974   stone disease   PERMANENT PACEMAKER GENERATOR CHANGE N/A 05/10/2014   Procedure: PERMANENT PACEMAKER GENERATOR CHANGE;  Surgeon: Hillis Range, MD;  Location: [facility] CATH LAB;  Service: Cardiovascular;  Laterality: N/A;   TONSILLECTOMY AND ADENOIDECTOMY  1930's   TOTAL KNEE ARTHROPLASTY Right 2001     Allergies  Allergen Reactions   Combigan [Brimonidine Tartrate-Timolol] Other (See Comments)    Systemic malaise amigen eye drop   Tape Other (See Comments)    SKIN IS VERY FRAGILE!!   Penicillins Hives   Breo Ellipta [Fluticasone Furoate-Vilanterol] Other (See Comments) and Hypertension    Increased blood pressure   Fluticasone    Other     SEASONAL ALLERGIES   Timolol    Vancomycin Other (See Comments)    Red man syndrome - Mild redness and discomfort. Able to complete initial dose.     Outpatient Encounter Medications as of 06/02/2023  Medication Sig   albuterol (VENTOLIN HFA) 108 (90 Base) MCG/ACT inhaler Inhale 2 puffs into the lungs every 6 (six) hours as needed for wheezing or shortness of breath.   Azelastine HCl 137 MCG/SPRAY SOLN USE 1  TO 2 SPRAYS INTO BOTH NOSTRILS TWICE DAILY.   Calcium 600-10 MG-MCG CHEW Chew 1 capsule by mouth daily.   Cholecalciferol (VITAMIN D) 125 MCG (5000 UT) CAPS Take 1 capsule by mouth daily.   dorzolamide (TRUSOPT) 2 % ophthalmic solution Place 1 drop into both eyes 2 (two) times daily.   famotidine (PEPCID) 40 MG tablet Take 1 tablet (40 mg total) by mouth at bedtime.   fexofenadine (ALLEGRA) 180 MG tablet Take 180 mg by mouth daily.   fluticasone (FLONASE) 50 MCG/ACT nasal spray USE 2 SPRAYS EACH NOSTRIL ONCE A DAY AS NEEDED FOR ALLERGIES OR CONGESTION.   gabapentin (NEURONTIN) 100 MG capsule Take 200 mg by mouth at bedtime.   guaiFENesin (MUCINEX) 600 MG 12 hr tablet Take 600 mg by mouth daily. And prn   ipratropium-albuterol (DUONEB) 0.5-2.5 (3) MG/3ML SOLN Take 3 mLs by nebulization at bedtime.   ipratropium-albuterol (DUONEB) 0.5-2.5 (3) MG/3ML SOLN Take 3 mLs by nebulization every 6 (six) hours as needed.   latanoprost (XALATAN) 0.005 % ophthalmic solution Place 1 drop into both eyes at bedtime.   levothyroxine (SYNTHROID) 75 MCG tablet Take 1 tablet (75 mcg total) by mouth daily.   losartan (COZAAR) 50 MG tablet Take 1 tablet (50 mg  total) by mouth daily.   Lutein 20 MG CAPS Take 1 capsule (20 mg total) by mouth daily.   Magnesium 500 MG TABS Take 750 mg by mouth See admin instructions. Take 750 mg by mouth every evening (in conjunction with 3 BioComplete capsules)   metoprolol tartrate (LOPRESSOR) 50 MG tablet Take 1.5 tablets (75 mg total) by mouth in the morning.   mometasone-formoterol (DULERA) 100-5 MCG/ACT AERO Inhale 2 puffs then rinse mouth, twice daily   Multiple Vitamins-Minerals (PRESERVISION AREDS 2) CAPS Take 1 capsule by mouth in the morning and at bedtime.   NON FORMULARY Take 3 capsules by mouth See admin instructions. Gundry MD Bio Complete 3 - Prebiotic, Probiotic, Postbiotic to Support Optimal Gut Health capsules- TAKE 3 CAPSULES BY MOUTH EVERY EVENING (in conjunction with 750 mg Magnesium)   potassium chloride (KLOR-CON) 10 MEQ tablet Take 2 tablets (20 mEq total) by mouth daily.   potassium chloride (KLOR-CON) 10 MEQ tablet Take 10 mEq by mouth daily.   predniSONE (DELTASONE) 2.5 MG tablet Take 1 tablet (2.5 mg total) by mouth daily with breakfast.   Rivaroxaban (XARELTO) 15 MG TABS tablet Take 15 mg by mouth daily.   torsemide (DEMADEX) 20 MG tablet Take 1 tablet (20 mg total) by mouth daily as needed. 3 lb weight gain in 1 day or 5 lb in 1 week or excessive edema   torsemide (DEMADEX) 20 MG tablet Take 1.5 tablets (30 mg total) by mouth daily.   TYLENOL 500 MG tablet Take 1,000 mg by mouth in the morning, at noon, and at bedtime.   montelukast (SINGULAIR) 10 MG tablet Take 1 tablet (10 mg total) by mouth daily. (Patient not taking: Reported on 06/02/2023)   [DISCONTINUED] doxycycline (VIBRA-TABS) 100 MG tablet Take 1 tablet (100 mg total) by mouth 2 (two) times daily. (Patient not taking: Reported on 06/02/2023)   [DISCONTINUED] predniSONE (DELTASONE) 5 MG tablet 2 daily x 7 days, then one daily (Patient not taking: Reported on 06/02/2023)   No facility-administered encounter medications on file as of  06/02/2023.    Review of Systems:  Review of Systems  Constitutional:  Positive for activity change (more sedentary over time.). Negative for appetite change, chills, diaphoresis, fatigue, fever and unexpected  weight change.  HENT:  Positive for congestion (chronic), hearing loss and postnasal drip. Negative for sore throat and trouble swallowing.        Feels like a frog in her throat all the time.   Eyes:        Poor vision  Respiratory:  Positive for cough and shortness of breath (with exertion chronic). Negative for wheezing.   Cardiovascular:  Positive for leg swelling (slight chronic). Negative for chest pain and palpitations.  Gastrointestinal:  Negative for abdominal distention, abdominal pain, constipation and diarrhea.  Genitourinary:  Negative for difficulty urinating and dysuria.  Musculoskeletal:  Positive for arthralgias and gait problem. Negative for back pain, joint swelling and myalgias.       Right shoulder  Left knee  Neurological:  Negative for dizziness, tremors, seizures, syncope, facial asymmetry, speech difficulty, weakness, light-headedness, numbness and headaches.  Psychiatric/Behavioral:  Negative for agitation, behavioral problems and confusion.     Health Maintenance  Topic Date Due   Medicare Annual Wellness (AWV)  03/19/2023   COVID-19 Vaccine (8 - 2024-25 season) 06/18/2023 (Originally 03/11/2023)   DTaP/Tdap/Td (2 - Td or Tdap) 02/23/2029   Pneumonia Vaccine 67+ Years old  Completed   INFLUENZA VACCINE  Completed   Zoster Vaccines- Shingrix  Completed   HPV VACCINES  Aged Out   DEXA SCAN  Discontinued    Physical Exam: Vitals:   06/02/23 1112  BP: 118/78  Pulse: 62  Resp: 17  Temp: 97.9 F (36.6 C)  TempSrc: Temporal  SpO2: 96%  Weight: 135 lb (61.2 kg)  Height: 5\' 3"  (1.6 m)   Body mass index is 23.91 kg/m. Wt Readings from Last 3 Encounters:  06/02/23 135 lb (61.2 kg)  05/12/23 141 lb 8.6 oz (64.2 kg)  05/05/23 141 lb 9.6 oz (64.2  kg)    Physical Exam Vitals and nursing note reviewed.  Constitutional:      General: She is not in acute distress.    Appearance: She is not diaphoretic.  HENT:     Head: Normocephalic and atraumatic.     Right Ear: Ear canal normal.     Left Ear: Ear canal normal.     Ears:     Comments: Decreased light reflex to both TMs    Nose: Nose normal.     Comments: Erythema to nasal passages    Mouth/Throat:     Mouth: Mucous membranes are moist.     Pharynx: Oropharynx is clear. Posterior oropharyngeal erythema present.     Comments: Petechial lesion above uvula Neck:     Vascular: No JVD.  Cardiovascular:     Rate and Rhythm: Normal rate and regular rhythm.     Heart sounds: No murmur heard. Pulmonary:     Effort: Pulmonary effort is normal. No respiratory distress.     Breath sounds: Wheezing present.     Comments: Decreased bases Abdominal:     General: Bowel sounds are normal. There is no distension.     Palpations: Abdomen is soft.     Tenderness: There is no abdominal tenderness.  Musculoskeletal:     Cervical back: No rigidity or tenderness.     Comments: Right ankle +1 edema. LLE no edema  Crepitus to right knee. NO swelling or warmth. No pain with ROM  Lymphadenopathy:     Cervical: No cervical adenopathy.  Skin:    General: Skin is warm.  Neurological:     Mental Status: She is alert and oriented to person, place,  and time.  Psychiatric:        Mood and Affect: Mood normal.     Labs reviewed: Basic Metabolic Panel: Recent Labs    12/31/22 0000 05/12/23 1435 05/20/23 0000  NA 143 141 141  K 3.7 4.6 3.9  CL 103 101 102  CO2 29* 29 29*  GLUCOSE  --  136  --   BUN 37* 38* 36*  CREATININE 1.3* 1.68* 1.4*  CALCIUM 10.0 10.2 9.5  TSH 2.30  --   --    Liver Function Tests: Recent Labs    07/09/22 0000  AST 20  ALT 11  ALKPHOS 67  ALBUMIN 3.6   No results for input(s): "LIPASE", "AMYLASE" in the last 8760 hours. No results for input(s): "AMMONIA"  in the last 8760 hours. CBC: Recent Labs    07/09/22 0000 05/12/23 1435  WBC 8.2 11.2*  NEUTROABS  --  9,733*  HGB 11.4* 12.2  HCT 35* 37.9  MCV  --  95.0  PLT 294 432*   Lipid Panel: No results for input(s): "CHOL", "HDL", "LDLCALC", "TRIG", "CHOLHDL", "LDLDIRECT" in the last 8760 hours. Lab Results  Component Value Date   HGBA1C 5.7 (H) 06/25/2017    Procedures since last visit: VAS [place] LOWER EXTREMITY VENOUS (DVT) Result Date: 05/13/2023  Lower Venous DVT Study Patient Name:  DESHEA POOLEY  Date of Exam:   05/12/2023 Medical Rec #: [id number]       Accession #:    [id number] Date of Birth: [birth year]       Patient Gender: F Patient Age:   89 years Exam Location:  [facility] Procedure:      VAS [place] LOWER EXTREMITY VENOUS (DVT) Referring Phys: [facility] MEDINA-VARGAS --------------------------------------------------------------------------------  Indications: Pain.  Anticoagulation: Xarelto. Comparison Study: Previous exam on 08/01/2014 was positive for DVT in LLE                   popliteal vein. Performing Technologist: Ernestene Mention RVT, RDMS  Examination Guidelines: A complete evaluation includes B-mode imaging, spectral Doppler, color Doppler, and power Doppler as needed of all accessible portions of each vessel. Bilateral testing is considered an integral part of a complete examination. Limited examinations for reoccurring indications may be performed as noted. The reflux portion of the exam is performed with the patient in reverse Trendelenburg.  +-----+---------------+---------+-----------+----------+--------------+ RIGHTCompressibilityPhasicitySpontaneityPropertiesThrombus Aging +-----+---------------+---------+-----------+----------+--------------+ CFV  Full           Yes      Yes                                 +-----+---------------+---------+-----------+----------+--------------+   +---------+---------------+---------+-----------+----------+-----------------+ LEFT      CompressibilityPhasicitySpontaneityPropertiesThrombus Aging    +---------+---------------+---------+-----------+----------+-----------------+ CFV      Full           Yes      Yes                                    +---------+---------------+---------+-----------+----------+-----------------+ SFJ      Full                                                           +---------+---------------+---------+-----------+----------+-----------------+  FV Prox  Full           Yes      Yes                                    +---------+---------------+---------+-----------+----------+-----------------+ FV Mid   Full           Yes      Yes                                    +---------+---------------+---------+-----------+----------+-----------------+ FV DistalFull           Yes      Yes                                    +---------+---------------+---------+-----------+----------+-----------------+ PFV      Full           Yes      Yes                                    +---------+---------------+---------+-----------+----------+-----------------+ POP      Full           Yes      Yes                                    +---------+---------------+---------+-----------+----------+-----------------+ PTV      Full           Yes      Yes                                    +---------+---------------+---------+-----------+----------+-----------------+ PERO     Full                                                           +---------+---------------+---------+-----------+----------+-----------------+ Gastroc  Partial        No       No                   Age Indeterminate +---------+---------------+---------+-----------+----------+-----------------+    Summary: RIGHT: - No evidence of common femoral vein obstruction.   LEFT: Findings consistent with age indeterminate intramuscular thrombus involving the left gastrocnemius veins. - No cystic structure found in the  popliteal fossa.  *See table(s) above for measurements and observations. Electronically signed by Sherald Hess MD on 05/13/2023 at 11:48:28 AM.    Final     Assessment/Plan  1. Chronic diastolic congestive heart failure (HCC) (Primary) Appears euvolemic Continue current dose of torsemide Followed by cardiology   2. Stage 3a chronic kidney disease (HCC) Continue to periodically monitor BMP and avoid nephrotoxic agents  3. COPD mixed type (HCC) Followed by Pulmonary Recommend duonebs bid if needed due to increase cough in the am, f/u if worsening ON prednisone 5 mg daily and Dulera   4. Seasonal and perennial allergic rhinitis Continue singulair, mucinex, flonase,  astelin, allegra  5. Chronic pain of left knee Followed by ortho for injections Ok to not use brace  6. Permanent atrial fibrillation (HCC) On xarelto  Rate is controlled on metoprolol   7. Essential hypertension Controlled  8. Acquired hypothyroidism Continue synthroid   9. Chronic DVT LLE Long term xarelto    Labs/tests ordered:  * No order type specified * Next appt:  6 weeks    Total time :  time greater than 50% of total time spent doing pt counseling and coordination of care

## 2023-06-24 ENCOUNTER — Ambulatory Visit (INDEPENDENT_AMBULATORY_CARE_PROVIDER_SITE_OTHER)

## 2023-06-24 DIAGNOSIS — I495 Sick sinus syndrome: Secondary | ICD-10-CM

## 2023-06-26 LAB — CUP PACEART REMOTE DEVICE CHECK
Battery Impedance: 1384 Ohm
Battery Remaining Longevity: 54 mo
Battery Voltage: 2.77 V
Brady Statistic RV Percent Paced: 100 %
Date Time Interrogation Session: 20250318112430
Implantable Lead Connection Status: 753985
Implantable Lead Connection Status: 753985
Implantable Lead Implant Date: 20090318
Implantable Lead Implant Date: 20090318
Implantable Lead Location: 753859
Implantable Lead Location: 753860
Implantable Lead Model: 5076
Implantable Lead Model: 5092
Implantable Pulse Generator Implant Date: 20160202
Lead Channel Impedance Value: 67 Ohm
Lead Channel Impedance Value: 703 Ohm
Lead Channel Pacing Threshold Amplitude: 1.125 V
Lead Channel Pacing Threshold Pulse Width: 0.4 ms
Lead Channel Setting Pacing Amplitude: 2.5 V
Lead Channel Setting Pacing Pulse Width: 0.4 ms
Lead Channel Setting Sensing Sensitivity: 4 mV
Zone Setting Status: 755011
Zone Setting Status: 755011

## 2023-06-27 ENCOUNTER — Other Ambulatory Visit (HOSPITAL_COMMUNITY): Payer: Self-pay

## 2023-06-30 ENCOUNTER — Encounter: Payer: Self-pay | Admitting: Internal Medicine

## 2023-06-30 ENCOUNTER — Encounter: Payer: Self-pay | Admitting: Adult Health

## 2023-06-30 ENCOUNTER — Non-Acute Institutional Stay: Payer: Medicare Other | Admitting: Adult Health

## 2023-06-30 VITALS — BP 120/78 | HR 65 | Temp 97.6°F | Resp 20 | Ht 63.0 in | Wt 135.0 lb

## 2023-06-30 DIAGNOSIS — Z0000 Encounter for general adult medical examination without abnormal findings: Secondary | ICD-10-CM | POA: Diagnosis not present

## 2023-06-30 DIAGNOSIS — Z961 Presence of intraocular lens: Secondary | ICD-10-CM | POA: Diagnosis not present

## 2023-06-30 DIAGNOSIS — H52202 Unspecified astigmatism, left eye: Secondary | ICD-10-CM | POA: Diagnosis not present

## 2023-06-30 DIAGNOSIS — H353133 Nonexudative age-related macular degeneration, bilateral, advanced atrophic without subfoveal involvement: Secondary | ICD-10-CM | POA: Diagnosis not present

## 2023-06-30 DIAGNOSIS — H401133 Primary open-angle glaucoma, bilateral, severe stage: Secondary | ICD-10-CM | POA: Diagnosis not present

## 2023-06-30 DIAGNOSIS — H548 Legal blindness, as defined in USA: Secondary | ICD-10-CM | POA: Diagnosis not present

## 2023-06-30 NOTE — Progress Notes (Signed)
 Subjective:   Jody Blake is a 89 y.o. female who presents for Medicare Annual (Subsequent) preventive examination at [facility].   Visit Complete: In person  Patient Medicare AWV questionnaire was completed by the patient on 06/30/23; I have confirmed that all information answered by patient is correct and no changes since this date.  Cardiac Risk Factors include: hypertension     Objective:    Today's Vitals   06/30/23 1307  BP: 120/78  Pulse: 65  Resp: 20  Temp: 97.6 F (36.4 C)  SpO2: 97%  Weight: 135 lb (61.2 kg)  Height: 5\' 3"  (1.6 m)   Body mass index is 23.91 kg/m.     06/30/2023    1:34 PM 06/02/2023   12:59 PM 05/12/2023    2:09 PM 04/14/2023    1:24 PM 02/25/2023   11:09 AM 01/07/2023   10:54 AM 11/04/2022    1:32 PM  Advanced Directives  Does Patient Have a Medical Advance Directive? Yes Yes Yes Yes Yes Yes Yes  Type of Estate agent of [place];Living will Out of facility DNR (pink MOST or yellow form) Out of facility DNR (pink MOST or yellow form) Out of facility DNR (pink MOST or yellow form) Healthcare Power of [place];Out of facility DNR (pink MOST or yellow form);Living will Healthcare Power of [place];Out of facility DNR (pink MOST or yellow form);Living will Healthcare Power of [place];Out of facility DNR (pink MOST or yellow form);Living will  Does patient want to make changes to medical advance directive? No - Patient declined No - Patient declined No - Patient declined No - Patient declined No - Patient declined No - Patient declined No - Patient declined  Copy of Healthcare Power of Attorney in Chart? Yes - validated most recent copy scanned in chart (See row information)    Yes - validated most recent copy scanned in chart (See row information) Yes - validated most recent copy scanned in chart (See row information) Yes - validated most recent copy scanned in chart (See row information)     Current Medications (verified) Outpatient Encounter Medications as of 06/30/2023  Medication Sig   albuterol (VENTOLIN HFA) 108 (90 Base) MCG/ACT inhaler Inhale 2 puffs into the lungs every 6 (six) hours as needed for wheezing or shortness of breath.   Azelastine HCl 137 MCG/SPRAY SOLN USE 1 TO 2 SPRAYS INTO BOTH NOSTRILS TWICE DAILY.   Calcium 600-10 MG-MCG CHEW Chew 1 capsule by mouth daily.   Cholecalciferol (VITAMIN D) 125 MCG (5000 UT) CAPS Take 1 capsule by mouth daily.   dorzolamide (TRUSOPT) 2 % ophthalmic solution Place 1 drop into both eyes 2 (two) times daily.   famotidine (PEPCID) 40 MG tablet Take 1 tablet (40 mg total) by mouth at bedtime.   fexofenadine (ALLEGRA) 180 MG tablet Take 180 mg by mouth daily.   fluticasone (FLONASE) 50 MCG/ACT nasal spray USE 2 SPRAYS EACH NOSTRIL ONCE A DAY AS NEEDED FOR ALLERGIES OR CONGESTION.   gabapentin (NEURONTIN) 100 MG capsule Take 200 mg by mouth at bedtime.   guaiFENesin (MUCINEX) 600 MG 12 hr tablet Take 600 mg by mouth daily. And prn   ipratropium-albuterol (DUONEB) 0.5-2.5 (3) MG/3ML SOLN Take 3 mLs by nebulization at bedtime.   ipratropium-albuterol (DUONEB) 0.5-2.5 (3) MG/3ML SOLN Take 3 mLs by nebulization every 6 (six) hours as needed.   latanoprost (XALATAN) 0.005 % ophthalmic solution Place 1 drop into both eyes at bedtime.   levothyroxine (SYNTHROID) 75  MCG tablet Take 1 tablet (75 mcg total) by mouth daily.   losartan (COZAAR) 50 MG tablet Take 1 tablet (50 mg total) by mouth daily.   Lutein 20 MG CAPS Take 1 capsule (20 mg total) by mouth daily.   Magnesium 500 MG TABS Take 750 mg by mouth See admin instructions. Take 750 mg by mouth every evening (in conjunction with 3 BioComplete capsules)   metoprolol tartrate (LOPRESSOR) 50 MG tablet Take 1.5 tablets (75 mg total) by mouth in the morning.   mometasone-formoterol (DULERA) 100-5 MCG/ACT AERO Inhale 2 puffs then rinse mouth, twice daily   montelukast (SINGULAIR) 10 MG  tablet Take 1 tablet (10 mg total) by mouth daily.   Multiple Vitamins-Minerals (PRESERVISION AREDS 2) CAPS Take 1 capsule by mouth in the morning and at bedtime.   NON FORMULARY Take 3 capsules by mouth See admin instructions. Gundry MD Bio Complete 3 - Prebiotic, Probiotic, Postbiotic to Support Optimal Gut Health capsules- TAKE 3 CAPSULES BY MOUTH EVERY EVENING (in conjunction with 750 mg Magnesium)   potassium chloride (KLOR-CON) 10 MEQ tablet Take 2 tablets (20 mEq total) by mouth daily.   potassium chloride (KLOR-CON) 10 MEQ tablet Take 10 mEq by mouth daily.   predniSONE (DELTASONE) 2.5 MG tablet Take 2 tablets (5 mg total) by mouth daily with breakfast.   Rivaroxaban (XARELTO) 15 MG TABS tablet Take 15 mg by mouth daily.   torsemide (DEMADEX) 20 MG tablet Take 1 tablet (20 mg total) by mouth daily as needed. 3 lb weight gain in 1 day or 5 lb in 1 week or excessive edema   torsemide (DEMADEX) 20 MG tablet Take 1.5 tablets (30 mg total) by mouth daily.   TYLENOL 500 MG tablet Take 1,000 mg by mouth in the morning, at noon, and at bedtime.   No facility-administered encounter medications on file as of 06/30/2023.    Allergies (verified) Combigan [brimonidine tartrate-timolol], Tape, Penicillins, Breo ellipta [fluticasone furoate-vilanterol], Fluticasone, Other, Timolol, and Vancomycin   History: Past Medical History:  Diagnosis Date   Anemia    Aortic stenosis    a. Echo 09/06/12 EF 55-60%, no WMA, G2DD, Ao valve sclerosis w/ mod stenosis, LA mildly dilated, PA pressure   Asthmatic bronchitis    Complete heart block (HCC)    s/p permanent pacemaker 06/27/1999 (Battery change 06/2007 and 2016).  s/p AV nodal ablation by Dr Johney Frame 2016.   COPD (chronic obstructive pulmonary disease) (HCC)    Dr. Maple Hudson   DDD (degenerative disc disease)    Diastolic dysfunction    a. Echo 09/06/12 EF 55-60%, no WMA, G2DD, Ao valve sclerosis w/ mod stenosis, LA mildly dilated, PA pressure    Diverticulosis    DVT (deep vein thrombosis) in pregnancy 1954   a. LLE   Hypertension    Hypothyroidism    on medication   Macular degeneration    Dr. Jesusita Oka   Osteoarthrosis, unspecified whether generalized or localized, other specified sites    PAF (paroxysmal atrial fibrillation) (HCC)    Stopped flecainide, on amiodarone but still has bouts of A FIB (mostly in mornings).    Pure hypercholesterolemia    Staghorn calculus    Left   Past Surgical History:  Procedure Laterality Date   APPENDECTOMY  ~ 1941   AV NODE ABLATION  05/10/2014   AV NODE ABLATION N/A 05/10/2014   Procedure: AV NODE ABLATION;  Surgeon: Hillis Range, MD;  [facility] C[facility] LAB;  Service: Cardiovascular;  Laterality: N/A;  BUNIONECTOMY WITH HAMMERTOE RECONSTRUCTION Bilateral ~ 1990   CARDIAC PACEMAKER PLACEMENT  06/27/99   Medtronic PM implanted by Dr Amil Amen   CARDIOVERSION N/A 12/02/2013   Procedure: CARDIOVERSION;  Surgeon: Pricilla Riffle, MD;  Location: [facility] ENDOSCOPY;  Service: Cardiovascular;  Laterality: N/A;   CATARACT EXTRACTION W/ INTRAOCULAR LENS  IMPLANT, BILATERAL Bilateral    COLONOSCOPY WITH PROPOFOL N/A 04/22/2013   Procedure: COLONOSCOPY WITH PROPOFOL;  Surgeon: Charolett Bumpers, MD;  Location: [facility] ENDOSCOPY;  Service: Endoscopy;  Laterality: N/A;   DILATION AND CURETTAGE OF UTERUS  X 2   "when I was going thru menopause"   ESOPHAGOGASTRODUODENOSCOPY (EGD) WITH PROPOFOL N/A 04/22/2013   Procedure: ESOPHAGOGASTRODUODENOSCOPY (EGD) WITH PROPOFOL;  Surgeon: Charolett Bumpers, MD;  Location: [facility] ENDOSCOPY;  Service: Endoscopy;  Laterality: N/A;   HERNIA REPAIR     INCISIONAL HERNIA REPAIR     INSERT / REPLACE / REMOVE PACEMAKER  06/2007   "took out the old; put in new"   INSERT / REPLACE / REMOVE PACEMAKER  05/10/2014   MDT PPM generator change by Dr Johney Frame   JOINT REPLACEMENT     PARTIAL NEPHRECTOMY Left 05/1974   stone disease   PERMANENT PACEMAKER GENERATOR CHANGE N/A 05/10/2014   Procedure: PERMANENT  PACEMAKER GENERATOR CHANGE;  Surgeon: Hillis Range, MD;  Location: [facility] CATH LAB;  Service: Cardiovascular;  Laterality: N/A;   TONSILLECTOMY AND ADENOIDECTOMY  1930's   TOTAL KNEE ARTHROPLASTY Right 2001   Family History  Problem Relation Age of Onset   Malignant hypertension Father    Hypertension Father    Renal Disease Father    Breast cancer Mother    Heart attack Brother    Stroke Brother    Social History   Socioeconomic History   Marital status: Widowed    Spouse name: Not on file   Number of children: 2   Years of education: Not on file   Highest education level: Not on file  Occupational History   Occupation: RETIRED    Employer: RETIRED    Comment: Family [employer] business  Tobacco Use   Smoking status: Never   Smokeless tobacco: Never  Vaping Use   Vaping status: Never Used  Substance and Sexual Activity   Alcohol use: Yes    Alcohol/week: 7.0 standard drinks of alcohol    Types: 7 Glasses of wine per week    Comment: occasionally   Drug use: No   Sexual activity: Not Currently    Partners: Male  Other Topics Concern   Not on file  Social History Narrative   Diet? Low salt, low fat      Do you drink/eat things with caffeine? yes      Marital status?                 married                   What year were you married? 1949      Do you live in a house, apartment, assisted living, condo, trailer, etc.? apartment      Is it one or more stories? 3 stories      How many persons live in your home? Just me      Do you have any pets in your home? (please list) no      Current or past profession: accounting      Do you exercise?          yes  Type & how often? Walk, class, prescribed daily      Do you have a living will? yes      Do you have a DNR form?     yes                             If not, do you want to discuss one?      Do you have signed POA/HPOA for forms? no         Social Drivers of Corporate investment banker  Strain: Low Risk  (02/16/2021)   Overall Financial Resource Strain (CARDIA)    Difficulty of Paying Living Expenses: Not hard at all  Food Insecurity: No Food Insecurity (04/08/2022)   Hunger Vital Sign    Worried About Running Out of Food in the Last Year: Never true    Ran Out of Food in the Last Year: Never true  Transportation Needs: No Transportation Needs (04/08/2022)   PRAPARE - Administrator, Civil Service (Medical): No    Lack of Transportation (Non-Medical): No  Physical Activity: Sufficiently Active (02/16/2021)   Exercise Vital Sign    Days of Exercise per Week: 7 days    Minutes of Exercise per Session: 30 min  Stress: No Stress Concern Present (02/16/2021)   [employer] of Occupational Health - Occupational Stress Questionnaire    Feeling of Stress : Not at all  Social Connections: Moderately Isolated (02/16/2021)   Social Connection and Isolation Panel [NHANES]    Frequency of Communication with Friends and Family: More than three times a week    Frequency of Social Gatherings with Friends and Family: More than three times a week    Attends Religious Services: Never    Database administrator or Organizations: Yes    Attends Engineer, structural: More than 4 times per year    Marital Status: Widowed    Tobacco Counseling Counseling given: Not Answered   Clinical Intake:  Pre-visit preparation completed: No  Pain : No/denies pain     BMI - recorded: 23.9 Nutritional Status: BMI of 19-24  Normal Nutritional Risks: None Diabetes: No  How often do you need to have someone help you when you read instructions, pamphlets, or other written materials from your doctor or pharmacy?: 3 - Sometimes (due to visual concerns)  Interpreter Needed?: No  Information entered by :: Fletcher Anon NP   Activities of Daily Living    06/30/2023    1:32 PM  In your present state of health, do you have any difficulty performing the following  activities:  Hearing? 1  Vision? 1  Difficulty concentrating or making decisions? 0  Walking or climbing stairs? 1  Dressing or bathing? 1  Doing errands, shopping? 1  Preparing Food and eating ? Y  Using the Toilet? N  In the past six months, have you accidently leaked urine? Y  Do you have problems with loss of bowel control? N  Managing your Medications? Y  Managing your Finances? Y  Housekeeping or managing your Housekeeping? Y    Patient Care Team: Mahlon Gammon, MD as PCP - General (Internal Medicine) Jake Bathe, MD as PCP - Cardiology (Cardiology) Hillis Range, MD (Inactive) as Consulting Physician (Cardiology) Waymon Budge, MD as Consulting Physician (Pulmonary Disease) Jethro Bolus, MD as Consulting Physician (Ophthalmology) Charolett Bumpers, MD as Consulting Physician (Gastroenterology) Ollen Gross, MD as Consulting Physician (  Orthopedic Surgery)  Indicate any recent Medical Services you may have received from other than [facility] providers in the past year (date may be approximate).     Assessment:   This is a routine wellness examination for Parissa.  Hearing/Vision screen Vision Screening - Comments:: Last eye exam less than 12 months with Dr. Sherryll Burger   Goals Addressed             This Visit's Progress    Patient Stated       Maintain physical function.        Depression Screen    06/30/2023    1:19 PM 05/12/2023    2:19 PM 04/14/2023    1:24 PM 02/25/2023   11:09 AM 01/07/2023   10:53 AM 11/04/2022    1:32 PM 06/10/2022    3:11 PM  PHQ 2/9 Scores  PHQ - 2 Score 0 0 0 0 0 0 0  PHQ- 9 Score 0          Fall Risk    06/30/2023    1:27 PM 05/12/2023    2:15 PM 04/14/2023    1:24 PM 02/25/2023   11:08 AM 01/07/2023   10:53 AM  Fall Risk   Falls in the past year? 0 0 0 1 0  Number falls in past yr: 0 0 0 1 0  Injury with Fall? 0 0 0 1 0  Risk for fall due to : No Fall Risks No Fall Risks  Impaired balance/gait;Impaired mobility;History of fall(s)  No Fall Risks  Follow up Falls evaluation completed Falls evaluation completed  Falls evaluation completed Falls evaluation completed    MEDICARE RISK AT HOME:    TIMED UP AND GO:  Was the test performed?  No     Cognitive Function:    02/16/2021    4:16 PM 05/27/2017   10:18 AM 03/26/2016   10:10 AM  MMSE - Mini Mental State Exam  Not completed: Refused    Orientation to time  5 5  Orientation to Place  5 5  Registration  3 3  Attention/ Calculation  5 5  Recall  3 3  Language- name 2 objects  2 2  Language- repeat  1 1  Language- follow 3 step command  3 3  Language- read & follow direction  1 1  Write a sentence  1 1  Copy design  0 1  Total score  29 30        06/30/2023    1:23 PM 06/30/2023    1:13 PM 02/16/2021    3:49 PM 02/15/2020   10:16 AM 02/05/2019   11:23 AM  6CIT Screen  What Year?  0 points 0 points 0 points 0 points  What month?  0 points 0 points 0 points 0 points  What time?  0 points 0 points 0 points 0 points  Count back from 20  0 points 0 points 0 points 0 points  Months in reverse 2 points 4 points 0 points 0 points 0 points  Repeat phrase  0 points 0 points 0 points 0 points  Total Score  4 points 0 points 0 points 0 points    Immunizations Immunization History  Administered Date(s) Administered   Fluad Trivalent(High Dose 65+) 01/09/2023   Influenza Split 01/07/2011   Influenza Whole 01/16/2009, 01/23/2010   Influenza, High Dose Seasonal PF 01/06/2013, 01/15/2019, 02/04/2020   Influenza,inj,Quad PF,6+ Mos 02/17/2015, 01/30/2018, 01/07/2022   Influenza,inj,quad, With Preservative 01/06/2017   Influenza-Unspecified  01/06/2014, 02/01/2016, 02/04/2020, 01/26/2021   Moderna Covid-19 Vaccine Bivalent Booster 75yrs & up 08/09/2021, 01/14/2023   Moderna SARS-COV2 Booster Vaccination 08/18/2020   Moderna Sars-Covid-2 Vaccination 04/20/2019, 05/18/2019, 02/22/2020   Pfizer(Comirnaty)Fall Seasonal Vaccine 12 years and older 01/28/2022    Pneumococcal Conjugate-13 02/24/2019   Pneumococcal Polysaccharide-23 11/23/2013   Respiratory Syncytial Virus Vaccine,Recomb Aduvanted(Arexvy) 01/07/2022   Tdap 02/24/2019   Zoster Recombinant(Shingrix) 03/13/2017, 05/20/2017    TDAP status: Up to date  Flu Vaccine status: Up to date  Pneumococcal vaccine status: Up to date  Covid-19 vaccine status: Completed vaccines  Qualifies for Shingles Vaccine? Yes   Zostavax completed Yes   Shingrix Completed?: Yes  Screening Tests Health Maintenance  Topic Date Due   COVID-19 Vaccine (8 - 2024-25 season) 01/07/2024 (Originally 03/11/2023)   Medicare Annual Wellness (AWV)  06/29/2024   DTaP/Tdap/Td (2 - Td or Tdap) 02/23/2029   Pneumonia Vaccine 42+ Years old  Completed   INFLUENZA VACCINE  Completed   Zoster Vaccines- Shingrix  Completed   HPV VACCINES  Aged Out   DEXA SCAN  Discontinued    Health Maintenance  There are no preventive care reminders to display for this patient.   Colorectal cancer screening: No longer required.   Mammogram status: No longer required due to age.  Bone Density status: Ordered not ordered du eto age and decreased ambulation. [name] Pt provided with contact info and advised to call to schedule appt.  Lung Cancer Screening: (Low Dose CT Chest recommended if Age 89-80 years, 20 pack-year currently smoking OR have quit w/in 15years.) does not qualify.   Lung Cancer Screening Referral: NA  Additional Screening:  Hepatitis C Screening: does not qualify; Completed NA  Vision Screening: Recommended annual ophthalmology exams for early detection of glaucoma and other disorders of the eye. Is the patient up to date with their annual eye exam?  Yes  Who is the provider or what is the name of the office in which the patient attends annual eye exams? [facility] If pt is not established with a provider, would they like to be referred to a provider to establish care? No .   Dental Screening: Recommended annual  dental exams for proper oral hygiene  Diabetic Foot Exam: not diabetic  Community Resource Referral / Chronic Care Management: CRR required this visit?  No   CCM required this visit?  No     Plan:     I have personally reviewed and noted the following in the patient's chart:   Medical and social history Use of alcohol, tobacco or illicit drugs  Current medications and supplements including opioid prescriptions. Patient is not currently taking opioid prescriptions. Functional ability and status Nutritional status Physical activity Advanced directives List of other physicians Hospitalizations, surgeries, and ER visits in previous 12 months Vitals Screenings to include cognitive, depression, and falls Referrals and appointments  In addition, I have reviewed and discussed with patient certain preventive protocols, quality metrics, and best practice recommendations. A written personalized care plan for preventive services as well as general preventive health recommendations were provided to patient.     Fletcher Anon, NP   06/30/2023   After Visit Summary: (In Person-Printed) AVS printed and given to the patient  Nurse Notes: NA

## 2023-06-30 NOTE — Patient Instructions (Addendum)
 Jody Blake , Thank you for taking time to come for your Medicare Wellness Visit. I appreciate your ongoing commitment to your health goals. Please review the following plan we discussed and let me know if I can assist you in the future.   Screening recommendations/referrals: Colonoscopy aged out  Mammogram aged out Bone Density declined Recommended yearly ophthalmology/optometry visit for glaucoma screening and checkup Recommended yearly dental visit for hygiene and checkup  Vaccinations: Influenza vaccine- due annually in September/October Pneumococcal vaccine recommend Prevnar 20 this fall/winter Tdap vaccine up to date Shingles vaccine up to date     Advanced directives: reviewed   Conditions/risks identified: fall risk   Next appointment: 1 year    Preventive Care 13 Years and Older, Female Preventive care refers to lifestyle choices and visits with your health care provider that can promote health and wellness. What does preventive care include? A yearly physical exam. This is also called an annual well check. Dental exams once or twice a year. Routine eye exams. Ask your health care provider how often you should have your eyes checked. Personal lifestyle choices, including: Daily care of your teeth and gums. Regular physical activity. Eating a healthy diet. Avoiding tobacco and drug use. Limiting alcohol use. Practicing safe sex. Taking low-dose aspirin every day. Taking vitamin and mineral supplements as recommended by your health care provider. What happens during an annual well check? The services and screenings done by your health care provider during your annual well check will depend on your age, overall health, lifestyle risk factors, and family history of disease. Counseling  Your health care provider may ask you questions about your: Alcohol use. Tobacco use. Drug use. Emotional well-being. Home and relationship well-being. Sexual activity. Eating  habits. History of falls. Memory and ability to understand (cognition). Work and work Astronomer. Reproductive health. Screening  You may have the following tests or measurements: Height, weight, and BMI. Blood pressure. Lipid and cholesterol levels. These may be checked every 5 years, or more frequently if you are over 48 years old. Skin check. Lung cancer screening. You may have this screening every year starting at age 68 if you have a 30-pack-year history of smoking and currently smoke or have quit within the past 15 years. Fecal occult blood test (FOBT) of the stool. You may have this test every year starting at age 34. Flexible sigmoidoscopy or colonoscopy. You may have a sigmoidoscopy every 5 years or a colonoscopy every 10 years starting at age 77. Hepatitis C blood test. Hepatitis B blood test. Sexually transmitted disease (STD) testing. Diabetes screening. This is done by checking your blood sugar (glucose) after you have not eaten for a while (fasting). You may have this done every 1-3 years. Bone density scan. This is done to screen for osteoporosis. You may have this done starting at age 74. Mammogram. This may be done every 1-2 years. Talk to your health care provider about how often you should have regular mammograms. Talk with your health care provider about your test results, treatment options, and if necessary, the need for more tests. Vaccines  Your health care provider may recommend certain vaccines, such as: Influenza vaccine. This is recommended every year. Tetanus, diphtheria, and acellular pertussis (Tdap, Td) vaccine. You may need a Td booster every 10 years. Zoster vaccine. You may need this after age 82. Pneumococcal 13-valent conjugate (PCV13) vaccine. One dose is recommended after age 52. Pneumococcal polysaccharide (PPSV23) vaccine. One dose is recommended after age 29. Talk to your  health care provider about which screenings and vaccines you need and how  often you need them. This information is not intended to replace advice given to you by your health care provider. Make sure you discuss any questions you have with your health care provider. Document Released: 04/21/2015 Document Revised: 12/13/2015 Document Reviewed: 01/24/2015 Elsevier Interactive Patient Education  2017 [employer].  Fall Prevention in the Home Falls can cause injuries. They can happen to people of all ages. There are many things you can do to make your home safe and to help prevent falls. What can I do on the outside of my home? Regularly fix the edges of walkways and driveways and fix any cracks. Remove anything that might make you trip as you walk through a door, such as a raised step or threshold. Trim any bushes or trees on the path to your home. Use bright outdoor lighting. Clear any walking paths of anything that might make someone trip, such as rocks or tools. Regularly check to see if handrails are loose or broken. Make sure that both sides of any steps have handrails. Any raised decks and porches should have guardrails on the edges. Have any leaves, snow, or ice cleared regularly. Use sand or salt on walking paths during winter. Clean up any spills in your garage right away. This includes oil or grease spills. What can I do in the bathroom? Use night lights. Install grab bars by the toilet and in the tub and shower. Do not use towel bars as grab bars. Use non-skid mats or decals in the tub or shower. If you need to sit down in the shower, use a plastic, non-slip stool. Keep the floor dry. Clean up any water that spills on the floor as soon as it happens. Remove soap buildup in the tub or shower regularly. Attach bath mats securely with double-sided non-slip rug tape. Do not have throw rugs and other things on the floor that can make you trip. What can I do in the bedroom? Use night lights. Make sure that you have a light by your bed that is easy to  reach. Do not use any sheets or blankets that are too big for your bed. They should not hang down onto the floor. Have a firm chair that has side arms. You can use this for support while you get dressed. Do not have throw rugs and other things on the floor that can make you trip. What can I do in the kitchen? Clean up any spills right away. Avoid walking on wet floors. Keep items that you use a lot in easy-to-reach places. If you need to reach something above you, use a strong step stool that has a grab bar. Keep electrical cords out of the way. Do not use floor polish or wax that makes floors slippery. If you must use wax, use non-skid floor wax. Do not have throw rugs and other things on the floor that can make you trip. What can I do with my stairs? Do not leave any items on the stairs. Make sure that there are handrails on both sides of the stairs and use them. Fix handrails that are broken or loose. Make sure that handrails are as long as the stairways. Check any carpeting to make sure that it is firmly attached to the stairs. Fix any carpet that is loose or worn. Avoid having throw rugs at the top or bottom of the stairs. If you do have throw rugs, attach them  to the floor with carpet tape. Make sure that you have a light switch at the top of the stairs and the bottom of the stairs. If you do not have them, ask someone to add them for you. What else can I do to help prevent falls? Wear shoes that: Do not have high heels. Have rubber bottoms. Are comfortable and fit you well. Are closed at the toe. Do not wear sandals. If you use a stepladder: Make sure that it is fully opened. Do not climb a closed stepladder. Make sure that both sides of the stepladder are locked into place. Ask someone to hold it for you, if possible. Clearly mark and make sure that you can see: Any grab bars or handrails. First and last steps. Where the edge of each step is. Use tools that help you move  around (mobility aids) if they are needed. These include: Canes. Walkers. Scooters. Crutches. Turn on the lights when you go into a dark area. Replace any light bulbs as soon as they burn out. Set up your furniture so you have a clear path. Avoid moving your furniture around. If any of your floors are uneven, fix them. If there are any pets around you, be aware of where they are. Review your medicines with your doctor. Some medicines can make you feel dizzy. This can increase your chance of falling. Ask your doctor what other things that you can do to help prevent falls. This information is not intended to replace advice given to you by your health care provider. Make sure you discuss any questions you have with your health care provider. Document Released: 01/19/2009 Document Revised: 08/31/2015 Document Reviewed: 04/29/2014 Elsevier Interactive Patient Education  2017 [employer].

## 2023-07-18 ENCOUNTER — Ambulatory Visit (INDEPENDENT_AMBULATORY_CARE_PROVIDER_SITE_OTHER): Admitting: Adult Health

## 2023-07-18 ENCOUNTER — Encounter: Payer: Self-pay | Admitting: Adult Health

## 2023-07-18 VITALS — BP 116/77 | HR 87 | Temp 97.0°F | Resp 18 | Ht 63.0 in | Wt 134.8 lb

## 2023-07-18 DIAGNOSIS — I10 Essential (primary) hypertension: Secondary | ICD-10-CM | POA: Diagnosis not present

## 2023-07-18 DIAGNOSIS — J441 Chronic obstructive pulmonary disease with (acute) exacerbation: Secondary | ICD-10-CM | POA: Diagnosis not present

## 2023-07-18 DIAGNOSIS — I5032 Chronic diastolic (congestive) heart failure: Secondary | ICD-10-CM | POA: Diagnosis not present

## 2023-07-18 DIAGNOSIS — I480 Paroxysmal atrial fibrillation: Secondary | ICD-10-CM | POA: Diagnosis not present

## 2023-07-18 MED ORDER — PREDNISONE 20 MG PO TABS: ORAL_TABLET | ORAL | 0 refills | Status: DC

## 2023-07-18 MED ORDER — PREDNISONE 5 MG PO TABS: 2.5000 mg | ORAL_TABLET | Freq: Every day | ORAL | 0 refills | Status: DC

## 2023-07-18 NOTE — Progress Notes (Signed)
 [facility] clinic  Provider:  Inge Mangle DNP  Code Status:  DNR  Goals of Care:     06/30/2023    1:34 PM  Advanced Directives  Does Patient Have a Medical Advance Directive? Yes  Type of Estate agent of [place];Living will  Does patient want to make changes to medical advance directive? No - Patient declined  Copy of Healthcare Power of Attorney in Chart? Yes - validated most recent copy scanned in chart (See row information)     Chief Complaint  Patient presents with   Cough    increased cough and SOB    Discussed the use of AI scribe software for clinical note transcription with the patient, who gave verbal consent to proceed.  HPI: Patient is a 89 y.o. female seen today for an acute visit for increased cough and SOB. She is a resident of [place]. She was accompanied by her caregiver.   Past Medical History:  Diagnosis Date   Anemia    Aortic stenosis    a. Echo 09/06/12 EF 55-60%, no WMA, G2DD, Ao valve sclerosis w/ mod stenosis, LA mildly dilated, PA pressure   Asthmatic bronchitis    Complete heart block (HCC)    s/p permanent pacemaker 06/27/1999 (Battery change 06/2007 and 2016).  s/p AV nodal ablation by Dr Nunzio Belch 2016.   COPD (chronic obstructive pulmonary disease) (HCC)    Dr. Linder Revere   DDD (degenerative disc disease)    Diastolic dysfunction    a. Echo 09/06/12 EF 55-60%, no WMA, G2DD, Ao valve sclerosis w/ mod stenosis, LA mildly dilated, PA pressure   Diverticulosis    DVT (deep vein thrombosis) in pregnancy 1954   a. LLE   Hypertension    Hypothyroidism    on medication   Macular degeneration    Dr. Alean Hunt   Osteoarthrosis, unspecified whether generalized or localized, other specified sites    PAF (paroxysmal atrial fibrillation) (HCC)    Stopped flecainide, on amiodarone but still has bouts of A FIB (mostly in mornings).    Pure hypercholesterolemia    Staghorn calculus    Left     Past Surgical History:  Procedure Laterality Date   APPENDECTOMY  ~ 1941   AV NODE ABLATION  05/10/2014   AV NODE ABLATION N/A 05/10/2014   Procedure: AV NODE ABLATION;  Surgeon: Jolly Needle, MD;  Location: [facility] CATH LAB;  Service: Cardiovascular;  Laterality: N/A;   BUNIONECTOMY WITH HAMMERTOE RECONSTRUCTION Bilateral ~ 1990   CARDIAC PACEMAKER PLACEMENT  06/27/99   Medtronic PM implanted by Dr Dortha Gauss   CARDIOVERSION N/A 12/02/2013   Procedure: CARDIOVERSION;  Surgeon: Elmyra Haggard, MD;  Location: [facility] ENDOSCOPY;  Service: Cardiovascular;  Laterality: N/A;   CATARACT EXTRACTION W/ INTRAOCULAR LENS  IMPLANT, BILATERAL Bilateral    COLONOSCOPY WITH PROPOFOL N/A 04/22/2013   Procedure: COLONOSCOPY WITH PROPOFOL;  Surgeon: Garrett Kallman, MD;  Location: [facility] ENDOSCOPY;  Service: Endoscopy;  Laterality: N/A;   DILATION AND CURETTAGE OF UTERUS  X 2   "when I was going thru menopause"   ESOPHAGOGASTRODUODENOSCOPY (EGD) WITH PROPOFOL N/A 04/22/2013   Procedure: ESOPHAGOGASTRODUODENOSCOPY (EGD) WITH PROPOFOL;  Surgeon: Garrett Kallman, MD;  Location: [facility] ENDOSCOPY;  Service: Endoscopy;  Laterality: N/A;   HERNIA REPAIR     INCISIONAL HERNIA REPAIR     INSERT / REPLACE / REMOVE PACEMAKER  06/2007   "took out the old; put in new"   INSERT / REPLACE / REMOVE  PACEMAKER  05/10/2014   MDT PPM generator change by Dr Nunzio Belch   JOINT REPLACEMENT     PARTIAL NEPHRECTOMY Left 05/1974   stone disease   PERMANENT PACEMAKER GENERATOR CHANGE N/A 05/10/2014   Procedure: PERMANENT PACEMAKER GENERATOR CHANGE;  Surgeon: Jolly Needle, MD;  Location: [facility] CATH LAB;  Service: Cardiovascular;  Laterality: N/A;   TONSILLECTOMY AND ADENOIDECTOMY  1930's   TOTAL KNEE ARTHROPLASTY Right 2001    Allergies  Allergen Reactions   Combigan [Brimonidine Tartrate-Timolol] Other (See Comments)    Systemic malaise amigen eye drop   Tape Other (See Comments)    SKIN IS VERY FRAGILE!!   Penicillins Hives   Breo Ellipta [Fluticasone  Furoate-Vilanterol] Other (See Comments) and Hypertension    Increased blood pressure   Fluticasone    Other     SEASONAL ALLERGIES   Timolol    Vancomycin Other (See Comments)    Red man syndrome - Mild redness and discomfort. Able to complete initial dose.     Outpatient Encounter Medications as of 07/18/2023  Medication Sig   albuterol (VENTOLIN HFA) 108 (90 Base) MCG/ACT inhaler Inhale 2 puffs into the lungs every 6 (six) hours as needed for wheezing or shortness of breath.   Azelastine HCl 137 MCG/SPRAY SOLN USE 1 TO 2 SPRAYS INTO BOTH NOSTRILS TWICE DAILY.   Calcium 600-10 MG-MCG CHEW Chew 1 capsule by mouth daily.   Cholecalciferol (VITAMIN D) 125 MCG (5000 UT) CAPS Take 1 capsule by mouth daily.   dorzolamide (TRUSOPT) 2 % ophthalmic solution Place 1 drop into both eyes 2 (two) times daily.   famotidine (PEPCID) 40 MG tablet Take 1 tablet (40 mg total) by mouth at bedtime.   fexofenadine (ALLEGRA) 180 MG tablet Take 180 mg by mouth daily.   fluticasone (FLONASE) 50 MCG/ACT nasal spray USE 2 SPRAYS EACH NOSTRIL ONCE A DAY AS NEEDED FOR ALLERGIES OR CONGESTION.   gabapentin (NEURONTIN) 100 MG capsule Take 200 mg by mouth at bedtime.   guaiFENesin (MUCINEX) 600 MG 12 hr tablet Take 600 mg by mouth daily. And prn   ipratropium-albuterol (DUONEB) 0.5-2.5 (3) MG/3ML SOLN Take 3 mLs by nebulization at bedtime.   ipratropium-albuterol (DUONEB) 0.5-2.5 (3) MG/3ML SOLN Take 3 mLs by nebulization every 6 (six) hours as needed.   latanoprost (XALATAN) 0.005 % ophthalmic solution Place 1 drop into both eyes at bedtime.   levothyroxine (SYNTHROID) 75 MCG tablet Take 1 tablet (75 mcg total) by mouth daily.   losartan (COZAAR) 50 MG tablet Take 1 tablet (50 mg total) by mouth daily.   Lutein 20 MG CAPS Take 1 capsule (20 mg total) by mouth daily.   Magnesium 500 MG TABS Take 750 mg by mouth See admin instructions. Take 750 mg by mouth every evening (in conjunction with 3 BioComplete capsules)    metoprolol tartrate (LOPRESSOR) 50 MG tablet Take 1.5 tablets (75 mg total) by mouth in the morning.   mometasone-formoterol (DULERA) 100-5 MCG/ACT AERO Inhale 2 puffs then rinse mouth, twice daily   montelukast (SINGULAIR) 10 MG tablet Take 1 tablet (10 mg total) by mouth daily.   Multiple Vitamins-Minerals (PRESERVISION AREDS 2) CAPS Take 1 capsule by mouth in the morning and at bedtime.   NON FORMULARY Take 3 capsules by mouth See admin instructions. Gundry MD Bio Complete 3 - Prebiotic, Probiotic, Postbiotic to Support Optimal Gut Health capsules- TAKE 3 CAPSULES BY MOUTH EVERY EVENING (in conjunction with 750 mg Magnesium)   potassium chloride (KLOR-CON) 10 MEQ tablet Take  2 tablets (20 mEq total) by mouth daily.   potassium chloride (KLOR-CON) 10 MEQ tablet Take 10 mEq by mouth daily.   predniSONE (DELTASONE) 20 MG tablet Take 2 tablets (40 mg total) by mouth daily with breakfast for 3 days, THEN 1 tablet (20 mg total) daily with breakfast for 3 days, THEN 0.5 tablets (10 mg total) daily with breakfast for 1 day, THEN 0.5 tablets (10 mg total) daily with breakfast for 1 day.   [START ON 07/26/2023] predniSONE (DELTASONE) 5 MG tablet Take 0.5 tablets (2.5 mg total) by mouth daily with breakfast.   Rivaroxaban (XARELTO) 15 MG TABS tablet Take 15 mg by mouth daily.   torsemide (DEMADEX) 20 MG tablet Take 1 tablet (20 mg total) by mouth daily as needed. 3 lb weight gain in 1 day or 5 lb in 1 week or excessive edema   torsemide (DEMADEX) 20 MG tablet Take 1.5 tablets (30 mg total) by mouth daily.   TYLENOL 500 MG tablet Take 1,000 mg by mouth in the morning, at noon, and at bedtime.   [DISCONTINUED] predniSONE (DELTASONE) 2.5 MG tablet Take 2 tablets (5 mg total) by mouth daily with breakfast.   No facility-administered encounter medications on file as of 07/18/2023.    Review of Systems:  Review of Systems  Constitutional:  Negative for appetite change, chills, fatigue and fever.  HENT:   Positive for hearing loss. Negative for congestion, rhinorrhea and sore throat.   Eyes: Negative.   Respiratory:  Positive for shortness of breath and wheezing. Negative for cough.   Cardiovascular:  Negative for chest pain, palpitations and leg swelling.  Gastrointestinal:  Negative for abdominal pain, constipation, diarrhea, nausea and vomiting.  Genitourinary:  Negative for dysuria.  Musculoskeletal:  Negative for arthralgias, back pain and myalgias.  Skin:  Negative for color change, rash and wound.  Neurological:  Negative for dizziness, weakness and headaches.  Psychiatric/Behavioral:  Negative for behavioral problems. The patient is not nervous/anxious.     Health Maintenance  Topic Date Due   COVID-19 Vaccine (8 - 2024-25 season) 01/07/2024 (Originally 03/11/2023)   INFLUENZA VACCINE  11/07/2023   Medicare Annual Wellness (AWV)  06/29/2024   DTaP/Tdap/Td (2 - Td or Tdap) 02/23/2029   Pneumonia Vaccine 27+ Years old  Completed   Zoster Vaccines- Shingrix  Completed   HPV VACCINES  Aged Out   Meningococcal B Vaccine  Aged Out   DEXA SCAN  Discontinued    Physical Exam: Vitals:   07/18/23 1502  BP: 116/77  Pulse: 87  Resp: 18  Temp: (!) 97 F (36.1 C)  SpO2: 96%  Weight: 134 lb 12.8 oz (61.1 kg)  Height: 5\' 3"  (1.6 m)   Body mass index is 23.88 kg/m. Physical Exam Constitutional:      Appearance: Normal appearance.  HENT:     Head: Normocephalic and atraumatic.     Nose: Nose normal.     Mouth/Throat:     Mouth: Mucous membranes are moist.  Eyes:     Conjunctiva/sclera: Conjunctivae normal.  Cardiovascular:     Rate and Rhythm: Normal rate and regular rhythm.     Comments: Left chest pacemaker Pulmonary:     Effort: Pulmonary effort is normal.     Breath sounds: Normal breath sounds.  Abdominal:     General: Bowel sounds are normal.     Palpations: Abdomen is soft.  Musculoskeletal:        General: Normal range of motion.     Cervical  back: Normal range  of motion.  Skin:    General: Skin is warm and dry.  Neurological:     General: No focal deficit present.     Mental Status: She is alert and oriented to person, place, and time.  Psychiatric:        Mood and Affect: Mood normal.        Behavior: Behavior normal.        Thought Content: Thought content normal.        Judgment: Judgment normal.     Labs reviewed: Basic Metabolic Panel: Recent Labs    12/31/22 0000 05/12/23 1435 05/20/23 0000  NA 143 141 141  K 3.7 4.6 3.9  CL 103 101 102  CO2 29* 29 29*  GLUCOSE  --  136  --   BUN 37* 38* 36*  CREATININE 1.3* 1.68* 1.4*  CALCIUM 10.0 10.2 9.5  TSH 2.30  --   --    Liver Function Tests: No results for input(s): "AST", "ALT", "ALKPHOS", "BILITOT", "PROT", "ALBUMIN" in the last 8760 hours. No results for input(s): "LIPASE", "AMYLASE" in the last 8760 hours. No results for input(s): "AMMONIA" in the last 8760 hours. CBC: Recent Labs    05/12/23 1435  WBC 11.2*  NEUTROABS 9,733*  HGB 12.2  HCT 37.9  MCV 95.0  PLT 432*   Lipid Panel: No results for input(s): "CHOL", "HDL", "LDLCALC", "TRIG", "CHOLHDL", "LDLDIRECT" in the last 8760 hours. Lab Results  Component Value Date   HGBA1C 5.7 (H) 06/25/2017    Procedures since last visit: CUP PACEART REMOTE DEVICE CHECK Result Date: 06/26/2023 Scheduled remote reviewed. Normal device function.  Presenting rhythm:  AF/VP Permanent AF, Xarelto per PA report Next remote 91 days. LA, CVRS   Assessment/Plan    1. COPD with acute exacerbation (HCC) (Primary) -  Persistent cough likely related to COPD and CHF. - Order chest x-ray at [facility]. - Start prednisone 40 mg daily for 3 days, then 20 mg daily for 3 days, then 10 mg daily for 1 day, resume 2.5 mg daily. - DG Chest 2 View - predniSONE (DELTASONE) 20 MG tablet; Take 2 tablets (40 mg total) by mouth daily with breakfast for 3 days, THEN 1 tablet (20 mg total) daily with breakfast for 3 days, THEN 0.5 tablets  (10 mg total) daily with breakfast for 1 day, THEN 0.5 tablets (10 mg total) daily with breakfast for 1 day.  Dispense: 10 tablet; Refill: 0 - predniSONE (DELTASONE) 5 MG tablet; Take 0.5 tablets (2.5 mg total) by mouth daily with breakfast.  Dispense: 30 tablet; Refill: 0  2. Chronic diastolic congestive heart failure (HCC) -  Chronic diastolic CHF with shortness of breath, possibly due to fluid overload. Monitor weight for diuretic adjustment. - Continue Demadex 20 mg daily as needed for weight gain of 3 pounds in one day or 5 pounds in one week.  3. Essential hypertension -  Blood pressure well-managed with losartan. - Continue losartan 50 mg daily.  4. PAF (paroxysmal atrial fibrillation) (HCC) -  Paroxysmal AFib effectively managed with metoprolol and Xarelto. - Continue metoprolol 75 mg in the morning. - Continue Xarelto 15 mg daily.    Labs/tests ordered:   chest x-ray   Return if symptoms worsen or fail to improve.  Adrick Kestler Medina-Vargas, NP

## 2023-07-21 ENCOUNTER — Encounter (HOSPITAL_COMMUNITY): Payer: Self-pay

## 2023-07-21 ENCOUNTER — Inpatient Hospital Stay (HOSPITAL_COMMUNITY)
Admission: EM | Admit: 2023-07-21 | Discharge: 2023-07-27 | DRG: 871 | Disposition: A | Discharge status: Skilled Nursing Facility | Attending: Family Medicine | Admitting: Family Medicine

## 2023-07-21 ENCOUNTER — Emergency Department (HOSPITAL_COMMUNITY)

## 2023-07-21 ENCOUNTER — Other Ambulatory Visit: Payer: Self-pay

## 2023-07-21 DIAGNOSIS — Z823 Family history of stroke: Secondary | ICD-10-CM

## 2023-07-21 DIAGNOSIS — Z950 Presence of cardiac pacemaker: Secondary | ICD-10-CM | POA: Diagnosis not present

## 2023-07-21 DIAGNOSIS — J168 Pneumonia due to other specified infectious organisms: Secondary | ICD-10-CM | POA: Diagnosis not present

## 2023-07-21 DIAGNOSIS — R54 Age-related physical debility: Secondary | ICD-10-CM | POA: Diagnosis present

## 2023-07-21 DIAGNOSIS — Z87442 Personal history of urinary calculi: Secondary | ICD-10-CM

## 2023-07-21 DIAGNOSIS — G629 Polyneuropathy, unspecified: Secondary | ICD-10-CM | POA: Diagnosis present

## 2023-07-21 DIAGNOSIS — I482 Chronic atrial fibrillation, unspecified: Secondary | ICD-10-CM | POA: Diagnosis present

## 2023-07-21 DIAGNOSIS — Z66 Do not resuscitate: Secondary | ICD-10-CM | POA: Diagnosis present

## 2023-07-21 DIAGNOSIS — Z880 Allergy status to penicillin: Secondary | ICD-10-CM

## 2023-07-21 DIAGNOSIS — I480 Paroxysmal atrial fibrillation: Secondary | ICD-10-CM | POA: Diagnosis present

## 2023-07-21 DIAGNOSIS — Z881 Allergy status to other antibiotic agents status: Secondary | ICD-10-CM

## 2023-07-21 DIAGNOSIS — Z803 Family history of malignant neoplasm of breast: Secondary | ICD-10-CM

## 2023-07-21 DIAGNOSIS — E876 Hypokalemia: Secondary | ICD-10-CM | POA: Diagnosis not present

## 2023-07-21 DIAGNOSIS — I10 Essential (primary) hypertension: Secondary | ICD-10-CM | POA: Diagnosis not present

## 2023-07-21 DIAGNOSIS — Z8249 Family history of ischemic heart disease and other diseases of the circulatory system: Secondary | ICD-10-CM | POA: Diagnosis not present

## 2023-07-21 DIAGNOSIS — Z961 Presence of intraocular lens: Secondary | ICD-10-CM | POA: Diagnosis present

## 2023-07-21 DIAGNOSIS — I5033 Acute on chronic diastolic (congestive) heart failure: Secondary | ICD-10-CM | POA: Diagnosis present

## 2023-07-21 DIAGNOSIS — K219 Gastro-esophageal reflux disease without esophagitis: Secondary | ICD-10-CM | POA: Diagnosis present

## 2023-07-21 DIAGNOSIS — N1831 Chronic kidney disease, stage 3a: Secondary | ICD-10-CM | POA: Diagnosis present

## 2023-07-21 DIAGNOSIS — E039 Hypothyroidism, unspecified: Secondary | ICD-10-CM | POA: Diagnosis present

## 2023-07-21 DIAGNOSIS — H3530 Unspecified macular degeneration: Secondary | ICD-10-CM | POA: Diagnosis present

## 2023-07-21 DIAGNOSIS — I130 Hypertensive heart and chronic kidney disease with heart failure and stage 1 through stage 4 chronic kidney disease, or unspecified chronic kidney disease: Secondary | ICD-10-CM | POA: Diagnosis present

## 2023-07-21 DIAGNOSIS — Z9841 Cataract extraction status, right eye: Secondary | ICD-10-CM

## 2023-07-21 DIAGNOSIS — R0602 Shortness of breath: Secondary | ICD-10-CM | POA: Diagnosis not present

## 2023-07-21 DIAGNOSIS — R0902 Hypoxemia: Secondary | ICD-10-CM | POA: Diagnosis not present

## 2023-07-21 DIAGNOSIS — Z1152 Encounter for screening for COVID-19: Secondary | ICD-10-CM | POA: Diagnosis not present

## 2023-07-21 DIAGNOSIS — Z7951 Long term (current) use of inhaled steroids: Secondary | ICD-10-CM

## 2023-07-21 DIAGNOSIS — H409 Unspecified glaucoma: Secondary | ICD-10-CM | POA: Diagnosis present

## 2023-07-21 DIAGNOSIS — Z79899 Other long term (current) drug therapy: Secondary | ICD-10-CM

## 2023-07-21 DIAGNOSIS — R918 Other nonspecific abnormal finding of lung field: Secondary | ICD-10-CM | POA: Diagnosis not present

## 2023-07-21 DIAGNOSIS — J440 Chronic obstructive pulmonary disease with acute lower respiratory infection: Secondary | ICD-10-CM | POA: Diagnosis present

## 2023-07-21 DIAGNOSIS — R0989 Other specified symptoms and signs involving the circulatory and respiratory systems: Secondary | ICD-10-CM | POA: Diagnosis not present

## 2023-07-21 DIAGNOSIS — Z79890 Hormone replacement therapy (postmenopausal): Secondary | ICD-10-CM

## 2023-07-21 DIAGNOSIS — R0689 Other abnormalities of breathing: Secondary | ICD-10-CM | POA: Diagnosis not present

## 2023-07-21 DIAGNOSIS — J441 Chronic obstructive pulmonary disease with (acute) exacerbation: Secondary | ICD-10-CM | POA: Diagnosis present

## 2023-07-21 DIAGNOSIS — J189 Pneumonia, unspecified organism: Secondary | ICD-10-CM | POA: Diagnosis present

## 2023-07-21 DIAGNOSIS — Z7901 Long term (current) use of anticoagulants: Secondary | ICD-10-CM

## 2023-07-21 DIAGNOSIS — Z9842 Cataract extraction status, left eye: Secondary | ICD-10-CM

## 2023-07-21 DIAGNOSIS — H548 Legal blindness, as defined in USA: Secondary | ICD-10-CM | POA: Diagnosis present

## 2023-07-21 DIAGNOSIS — A419 Sepsis, unspecified organism: Secondary | ICD-10-CM | POA: Diagnosis present

## 2023-07-21 DIAGNOSIS — R6520 Severe sepsis without septic shock: Secondary | ICD-10-CM | POA: Diagnosis present

## 2023-07-21 DIAGNOSIS — E7800 Pure hypercholesterolemia, unspecified: Secondary | ICD-10-CM | POA: Diagnosis present

## 2023-07-21 DIAGNOSIS — Z96651 Presence of right artificial knee joint: Secondary | ICD-10-CM | POA: Diagnosis present

## 2023-07-21 DIAGNOSIS — Z841 Family history of disorders of kidney and ureter: Secondary | ICD-10-CM

## 2023-07-21 DIAGNOSIS — Z86718 Personal history of other venous thrombosis and embolism: Secondary | ICD-10-CM

## 2023-07-21 DIAGNOSIS — R059 Cough, unspecified: Secondary | ICD-10-CM | POA: Diagnosis not present

## 2023-07-21 DIAGNOSIS — H9190 Unspecified hearing loss, unspecified ear: Secondary | ICD-10-CM | POA: Diagnosis present

## 2023-07-21 DIAGNOSIS — Z7952 Long term (current) use of systemic steroids: Secondary | ICD-10-CM

## 2023-07-21 DIAGNOSIS — Z905 Acquired absence of kidney: Secondary | ICD-10-CM

## 2023-07-21 DIAGNOSIS — Z888 Allergy status to other drugs, medicaments and biological substances status: Secondary | ICD-10-CM

## 2023-07-21 LAB — I-STAT CG4 LACTIC ACID, ED: Lactic Acid, Venous: 2.3 mmol/L (ref 0.5–1.9)

## 2023-07-21 LAB — CBC
HCT: 38.9 % (ref 36.0–46.0)
Hemoglobin: 11.6 g/dL — ABNORMAL LOW (ref 12.0–15.0)
MCH: 30.3 pg (ref 26.0–34.0)
MCHC: 29.8 g/dL — ABNORMAL LOW (ref 30.0–36.0)
MCV: 101.6 fL — ABNORMAL HIGH (ref 80.0–100.0)
Platelets: 405 10*3/uL — ABNORMAL HIGH (ref 150–400)
RBC: 3.83 MIL/uL — ABNORMAL LOW (ref 3.87–5.11)
RDW: 14.9 % (ref 11.5–15.5)
WBC: 15.6 10*3/uL — ABNORMAL HIGH (ref 4.0–10.5)
nRBC: 0 % (ref 0.0–0.2)

## 2023-07-21 LAB — BLOOD GAS, VENOUS
Acid-Base Excess: 5.2 mmol/L — ABNORMAL HIGH (ref 0.0–2.0)
Bicarbonate: 31.8 mmol/L — ABNORMAL HIGH (ref 20.0–28.0)
O2 Saturation: 54.1 %
Patient temperature: 37
pCO2, Ven: 55 mmHg (ref 44–60)
pH, Ven: 7.37 (ref 7.25–7.43)
pO2, Ven: 32 mmHg (ref 32–45)

## 2023-07-21 LAB — BRAIN NATRIURETIC PEPTIDE: B Natriuretic Peptide: 428.3 pg/mL — ABNORMAL HIGH (ref 0.0–100.0)

## 2023-07-21 LAB — COMPREHENSIVE METABOLIC PANEL WITH GFR
ALT: 13 U/L (ref 0–44)
AST: 20 U/L (ref 15–41)
Albumin: 3.5 g/dL (ref 3.5–5.0)
Alkaline Phosphatase: 64 U/L (ref 38–126)
Anion gap: 14 (ref 5–15)
BUN: 35 mg/dL — ABNORMAL HIGH (ref 8–23)
CO2: 26 mmol/L (ref 22–32)
Calcium: 10.2 mg/dL (ref 8.9–10.3)
Chloride: 105 mmol/L (ref 98–111)
Creatinine, Ser: 1.34 mg/dL — ABNORMAL HIGH (ref 0.44–1.00)
GFR, Estimated: 37 mL/min — ABNORMAL LOW (ref >60)
Glucose, Bld: 144 mg/dL — ABNORMAL HIGH (ref 70–99)
Potassium: 3.5 mmol/L (ref 3.5–5.1)
Sodium: 145 mmol/L (ref 135–145)
Total Bilirubin: 0.8 mg/dL (ref 0.0–1.2)
Total Protein: 7.4 g/dL (ref 6.5–8.1)

## 2023-07-21 LAB — RESP PANEL BY RT-PCR (RSV, FLU A&B, COVID)  RVPGX2
Influenza A by PCR: NEGATIVE
Influenza B by PCR: NEGATIVE
Resp Syncytial Virus by PCR: NEGATIVE
SARS Coronavirus 2 by RT PCR: NEGATIVE

## 2023-07-21 LAB — LACTIC ACID, PLASMA
Lactic Acid, Venous: 2.6 mmol/L (ref 0.5–1.9)
Lactic Acid, Venous: 2.1 mmol/L (ref 0.5–1.9)
Lactic Acid, Venous: 1.8 mmol/L (ref 0.5–1.9)

## 2023-07-21 LAB — TROPONIN I (HIGH SENSITIVITY)
Troponin I (High Sensitivity): 40 ng/L — ABNORMAL HIGH (ref <18)
Troponin I (High Sensitivity): 51 ng/L — ABNORMAL HIGH (ref <18)

## 2023-07-21 MED ORDER — ONDANSETRON HCL 4 MG/2ML IJ SOLN: 4.0000 mg | Freq: Four times a day (QID) | INTRAMUSCULAR | Status: DC | PRN

## 2023-07-21 MED ORDER — NON FORMULARY: 3.0000 | Freq: Every evening | Status: DC

## 2023-07-21 MED ORDER — ACETAMINOPHEN 500 MG PO TABS
1000.0000 mg | ORAL_TABLET | Freq: Once | ORAL | Status: AC
  Administered 2023-07-21: 1000 mg via ORAL
  Filled 2023-07-21: qty 2

## 2023-07-21 MED ORDER — HOME MED STORE IN PYXIS
3.0000 | Freq: Every day | Status: DC
  Filled 2023-07-21 (×2): qty 3

## 2023-07-21 MED ORDER — NON FORMULARY: 3.0000 | Freq: Every day | Status: DC

## 2023-07-21 MED ORDER — IPRATROPIUM-ALBUTEROL 0.5-2.5 (3) MG/3ML IN SOLN
3.0000 mL | Freq: Once | RESPIRATORY_TRACT | Status: AC
  Administered 2023-07-21: 3 mL via RESPIRATORY_TRACT
  Filled 2023-07-21: qty 3

## 2023-07-21 MED ORDER — ACETAMINOPHEN 325 MG PO TABS
650.0000 mg | ORAL_TABLET | Freq: Four times a day (QID) | ORAL | Status: DC | PRN
  Administered 2023-07-25: 650 mg via ORAL
  Filled 2023-07-21 (×2): qty 2

## 2023-07-21 MED ORDER — ACETAMINOPHEN 650 MG RE SUPP: 650.0000 mg | Freq: Four times a day (QID) | RECTAL | Status: DC | PRN

## 2023-07-21 MED ORDER — SODIUM CHLORIDE 0.9 % IV BOLUS
1000.0000 mL | Freq: Once | INTRAVENOUS | Status: AC
  Administered 2023-07-21: 1000 mL via INTRAVENOUS

## 2023-07-21 MED ORDER — IPRATROPIUM-ALBUTEROL 0.5-2.5 (3) MG/3ML IN SOLN
3.0000 mL | Freq: Four times a day (QID) | RESPIRATORY_TRACT | Status: DC
  Administered 2023-07-21 – 2023-07-22 (×3): 3 mL via RESPIRATORY_TRACT
  Filled 2023-07-21 (×3): qty 3

## 2023-07-21 MED ORDER — SODIUM CHLORIDE 0.9 % IV SOLN
500.0000 mg | INTRAVENOUS | Status: DC
  Administered 2023-07-22: 500 mg via INTRAVENOUS
  Filled 2023-07-21: qty 5

## 2023-07-21 MED ORDER — METHYLPREDNISOLONE SODIUM SUCC 40 MG IJ SOLR
40.0000 mg | Freq: Two times a day (BID) | INTRAMUSCULAR | Status: DC
  Administered 2023-07-21 – 2023-07-27 (×12): 40 mg via INTRAVENOUS
  Filled 2023-07-21 (×12): qty 1

## 2023-07-21 MED ORDER — RIVAROXABAN 15 MG PO TABS
15.0000 mg | ORAL_TABLET | Freq: Every day | ORAL | Status: DC
  Administered 2023-07-21 – 2023-07-26 (×6): 15 mg via ORAL
  Filled 2023-07-21 (×6): qty 1

## 2023-07-21 MED ORDER — HOME MED STORE IN PYXIS
3.0000 | Freq: Every day | Status: DC
  Administered 2023-07-21 – 2023-07-26 (×6): 3 via ORAL
  Filled 2023-07-21 (×9): qty 3

## 2023-07-21 MED ORDER — ONDANSETRON HCL 4 MG PO TABS: 4.0000 mg | ORAL_TABLET | Freq: Four times a day (QID) | ORAL | Status: DC | PRN

## 2023-07-21 MED ORDER — ALBUTEROL SULFATE (2.5 MG/3ML) 0.083% IN NEBU
2.5000 mg | INHALATION_SOLUTION | RESPIRATORY_TRACT | Status: DC | PRN
  Administered 2023-07-25: 2.5 mg via RESPIRATORY_TRACT
  Filled 2023-07-21: qty 3

## 2023-07-21 MED ORDER — SODIUM CHLORIDE 0.9 % IV SOLN
1.0000 g | Freq: Once | INTRAVENOUS | Status: AC
  Administered 2023-07-21: 1 g via INTRAVENOUS
  Filled 2023-07-21: qty 10

## 2023-07-21 MED ORDER — SODIUM CHLORIDE 0.9 % IV SOLN
500.0000 mg | Freq: Once | INTRAVENOUS | Status: AC
  Administered 2023-07-21: 500 mg via INTRAVENOUS
  Filled 2023-07-21: qty 5

## 2023-07-21 MED ORDER — SODIUM CHLORIDE 0.9 % IV SOLN
1.0000 g | INTRAVENOUS | Status: AC
  Administered 2023-07-22 – 2023-07-25 (×4): 1 g via INTRAVENOUS
  Filled 2023-07-21 (×4): qty 10

## 2023-07-21 NOTE — TOC Initial Note (Signed)
 Transition of Care [facility]) - Initial/Assessment Note    Patient Details  Name: Jody Blake MRN: [id number] Date of Birth: [birth year]  Transition of Care [facility]) CM/SW Contact:    Gertha Ku, LCSW Phone Number: 07/21/2023, 2:09 PM  Clinical Narrative:                 CSW spoke with the pt's daughter, Jody Blake. Pt resides at [facility]. Jody Blake reported that the pt lives alone but has 24-hour aide services and that the aides will remain with the pt throughout the hospitalization. She also noted that the pt is hard of hearing and uses hearing aids. At baseline, the pt uses a rollator. The pt's daughter is requesting SNF placement at [facility] upon discharge. TOC to follow.   Expected Discharge Plan: Skilled Nursing Facility Barriers to Discharge: Continued Medical Work up   Patient Goals and CMS Choice Patient states their goals for this hospitalization and ongoing recovery are:: [place]          Expected Discharge Plan and Services       Living arrangements for the past 2 months: Independent Living Facility                                      Prior Living Arrangements/Services Living arrangements for the past 2 months: Independent Living Facility Lives with:: Self Patient language and need for interpreter reviewed:: Yes Do you feel safe going back to the place where you live?: Yes      Need for Family Participation in Patient Care: Yes (Comment) Care giver support system in place?: Yes (comment) Current home services: Homehealth aide, DME Criminal Activity/Legal Involvement Pertinent to Current Situation/Hospitalization: No - Comment as needed  Activities of Daily Living      Permission Sought/Granted                  Emotional Assessment              Admission diagnosis:  CAP (community acquired pneumonia) [J18.9] Sepsis due to pneumonia (HCC) [J18.9, A41.9] Patient Active Problem List    Diagnosis Date Noted   Sepsis due to pneumonia (HCC) 07/21/2023   Contusion of left wrist 07/08/2022   Acute pain of left wrist 07/08/2022   Acute deep vein thrombosis (DVT) of femoral vein of left lower extremity (HCC) 07/04/2022   Osteoarthritis of left knee 04/26/2022   COPD with acute exacerbation (HCC) 04/08/2022   Hypokalemia 04/08/2022   Acute diastolic CHF (congestive heart failure) (HCC) 04/12/2021   Acute on chronic diastolic CHF (congestive heart failure) (HCC) 04/11/2021   CAP (community acquired pneumonia) 10/27/2019   Plantar fasciitis of right foot 07/07/2019   Acute embolism and thrombosis of deep vein of lower extremity (HCC) 03/18/2018   Osteoarthritis of right glenohumeral joint 08/22/2017   Macular pucker, right eye 07/08/2017   Primary open angle glaucoma of both eyes, mild stage 07/08/2017   TIA (transient ischemic attack) 06/24/2017   Hypercoagulable state due to atrial fibrillation (HCC) 03/05/2017   Ataxia 11/14/2016   DVT (deep vein thrombosis) in pregnancy 11/14/2016   CKD (chronic kidney disease), stage III (HCC) 11/14/2016   Chronic diastolic congestive heart failure (HCC) 11/14/2016   Hypothyroidism 11/08/2015   Age-related macular degeneration, dry, both eyes 09/05/2015   Bilateral ocular hypertension 09/05/2015   Pseudophakia of both eyes 09/05/2015   Pleural effusion on  left 06/17/2015   Complete heart block (HCC) 06/22/2014   Sick sinus syndrome (HCC) 04/29/2014   Tachycardia-bradycardia syndrome (HCC) 01/06/2013   Cardiac pacemaker in situ 09/07/2012   Long term (current) use of anticoagulants 09/07/2012   Orthostatic hypotension 09/07/2012   Syncope 09/06/2012   Choroidal nevus of left eye 07/26/2011   Macular degeneration 07/26/2011   COPD mixed type (HCC) 04/26/2010   Seasonal and perennial allergic rhinitis 10/27/2008   Essential hypertension 04/19/2008   Permanent atrial fibrillation (HCC) 04/19/2008   GERD 10/29/2007   Bronchitis,  chronic obstructive, with exacerbation (HCC) 04/06/2007   APPENDECTOMY, HX OF 04/06/2007   PCP:  Marguerite Shiley, MD Pharmacy:   [facility] [place], [place] - [address] [place] [address] Phone: [phone] Fax: [phone]  [facility] [place], [place] - [address] [place] [address] Phone: [phone] Fax: [phone]     Social Drivers of Health (SDOH) Social History: SDOH Screenings   Food Insecurity: No Food Insecurity (04/08/2022)  Housing: Low Risk  (04/08/2022)  Transportation Needs: No Transportation Needs (04/08/2022)  Utilities: Patient Declined (04/08/2022)  Alcohol Screen: Low Risk  (02/16/2021)  Depression (PHQ2-9): Low Risk  (06/30/2023)  Financial Resource Strain: Low Risk  (02/16/2021)  Physical Activity: Sufficiently Active (02/16/2021)  Social Connections: Moderately Isolated (02/16/2021)  Stress: No Stress Concern Present (02/16/2021)  Tobacco Use: Low Risk  (07/21/2023)   SDOH Interventions:     Readmission Risk Interventions     No data to display

## 2023-07-21 NOTE — Sepsis Progress Note (Signed)
 Notified bedside nurse of need to draw repeat lactic acid.

## 2023-07-21 NOTE — ED Notes (Signed)
 Floor is ready for patient.  Family is at bedside.

## 2023-07-21 NOTE — ED Provider Notes (Signed)
 [place] EMERGENCY DEPARTMENT AT [facility] Provider Note   CSN: [id number] Arrival date & time: 07/21/23  4098     History  Chief Complaint  Patient presents with   Shortness of Breath    Jody Blake is a 89 y.o. female.  Patient with past medical history of A-fib on Xarelto who, hypertension, COPD, pacemaker due to complete heart block, chronic kidney disease stage III, congestive heart failure last EF 65-70% in 1/23 presenting to ER with shortness of breath. Has received solumedrol, albuterol, duo neb with EMS and is on 4L [place] reports she is still feeling short of breath, but treatment did help. Reports she has had increase shortness of breath and increase productive cough for 5 days. Lives in assisted facility. Has been taking prednisone. Has had fever/chills. Dose note left lower chest pain, worse with cough or to the touch. Denies abdominal pain, NVD.   Shortness of Breath      Home Medications Prior to Admission medications   Medication Sig Start Date End Date Taking? Authorizing Provider  albuterol (VENTOLIN HFA) 108 (90 Base) MCG/ACT inhaler Inhale 2 puffs into the lungs every 6 (six) hours as needed for wheezing or shortness of breath.    [provider]  Azelastine HCl 137 MCG/SPRAY SOLN USE 1 TO 2 SPRAYS INTO BOTH NOSTRILS TWICE DAILY. 09/24/21   Raylene Calamity, NP  Calcium 600-10 MG-MCG CHEW Chew 1 capsule by mouth daily. 12/12/21   Gupta, Anjali L, MD  Cholecalciferol (VITAMIN D) 125 MCG (5000 UT) CAPS Take 1 capsule by mouth daily. 11/28/21   Gupta, Anjali L, MD  dorzolamide (TRUSOPT) 2 % ophthalmic solution Place 1 drop into both eyes 2 (two) times daily.    [provider]  famotidine (PEPCID) 40 MG tablet Take 1 tablet (40 mg total) by mouth at bedtime. 11/28/21   Gupta, Anjali L, MD  fexofenadine (ALLEGRA) 180 MG tablet Take 180 mg by mouth daily.    [provider]  fluticasone (FLONASE) 50 MCG/ACT nasal spray USE 2 SPRAYS  EACH NOSTRIL ONCE A DAY AS NEEDED FOR ALLERGIES OR CONGESTION. 01/04/21   Rosa College D, MD  gabapentin (NEURONTIN) 100 MG capsule Take 200 mg by mouth at bedtime.    [provider]  guaiFENesin (MUCINEX) 600 MG 12 hr tablet Take 600 mg by mouth daily. And prn    [provider]  ipratropium-albuterol (DUONEB) 0.5-2.5 (3) MG/3ML SOLN Take 3 mLs by nebulization at bedtime. 07/04/22   Raylene Calamity, NP  ipratropium-albuterol (DUONEB) 0.5-2.5 (3) MG/3ML SOLN Take 3 mLs by nebulization every 6 (six) hours as needed. 06/02/23   Wert, Christina, NP  latanoprost (XALATAN) 0.005 % ophthalmic solution Place 1 drop into both eyes at bedtime.    [provider]  levothyroxine (SYNTHROID) 75 MCG tablet Take 1 tablet (75 mcg total) by mouth daily. 08/06/21   Raylene Calamity, NP  losartan (COZAAR) 50 MG tablet Take 1 tablet (50 mg total) by mouth daily. 11/22/21   Raylene Calamity, NP  Lutein 20 MG CAPS Take 1 capsule (20 mg total) by mouth daily. 07/23/21   Gupta, Anjali L, MD  Magnesium 500 MG TABS Take 750 mg by mouth See admin instructions. Take 750 mg by mouth every evening (in conjunction with 3 BioComplete capsules)    [provider]  metoprolol tartrate (LOPRESSOR) 50 MG tablet Take 1.5 tablets (75 mg total) by mouth in the morning. 07/24/21   Gupta, Anjali L, MD  mometasone-formoterol [facility]  100-5 MCG/ACT AERO Inhale 2 puffs then rinse mouth, twice daily 06/24/22   Jetty Duhamel D, MD  montelukast (SINGULAIR) 10 MG tablet Take 1 tablet (10 mg total) by mouth daily. 07/24/21   Mahlon Gammon, MD  Multiple Vitamins-Minerals (PRESERVISION AREDS 2) CAPS Take 1 capsule by mouth in the morning and at bedtime. 07/24/21   Mahlon Gammon, MD  NON FORMULARY Take 3 capsules by mouth See admin instructions. Gundry MD Bio Complete 3 - Prebiotic, Probiotic, Postbiotic to Support Optimal Gut Health capsules- TAKE 3 CAPSULES BY MOUTH EVERY EVENING (in conjunction with 750 mg Magnesium)     [provider]  potassium chloride (KLOR-CON) 10 MEQ tablet Take 2 tablets (20 mEq total) by mouth daily. 08/06/21   Fletcher Anon, NP  potassium chloride (KLOR-CON) 10 MEQ tablet Take 10 mEq by mouth daily.    [provider]  predniSONE (DELTASONE) 20 MG tablet Take 2 tablets (40 mg total) by mouth daily with breakfast for 3 days, THEN 1 tablet (20 mg total) daily with breakfast for 3 days, THEN 0.5 tablets (10 mg total) daily with breakfast for 1 day, THEN 0.5 tablets (10 mg total) daily with breakfast for 1 day. 07/18/23 07/26/23  Medina-Vargas, Monina C, NP  predniSONE (DELTASONE) 5 MG tablet Take 0.5 tablets (2.5 mg total) by mouth daily with breakfast. 07/26/23   Medina-Vargas, Monina C, NP  Rivaroxaban (XARELTO) 15 MG TABS tablet Take 15 mg by mouth daily.    [provider]  torsemide (DEMADEX) 20 MG tablet Take 1 tablet (20 mg total) by mouth daily as needed. 3 lb weight gain in 1 day or 5 lb in 1 week or excessive edema 05/21/22   Fargo, Amy E, NP  torsemide (DEMADEX) 20 MG tablet Take 1.5 tablets (30 mg total) by mouth daily. 07/04/22   Fletcher Anon, NP  TYLENOL 500 MG tablet Take 1,000 mg by mouth in the morning, at noon, and at bedtime.    [provider]      Allergies    Combigan [brimonidine tartrate-timolol], Tape, Penicillins, Breo ellipta [fluticasone furoate-vilanterol], Fluticasone, Other, Timolol, and Vancomycin    Review of Systems   Review of Systems  Respiratory:  Positive for shortness of breath.     Physical Exam Updated Vital Signs BP (!) 206/82 (BP Location: Right Arm)   Pulse 75   Temp (!) 100.6 F (38.1 C) (Rectal)   Resp (!) 25   LMP 04/08/1974   SpO2 97%  Physical Exam Vitals and nursing note reviewed.  Constitutional:      General: She is not in acute distress.    Appearance: She is not toxic-appearing.  HENT:     Head: Normocephalic and atraumatic.  Eyes:     General: No scleral icterus.     Conjunctiva/sclera: Conjunctivae normal.  Cardiovascular:     Rate and Rhythm: Normal rate and regular rhythm.     Pulses: Normal pulses.     Heart sounds: Normal heart sounds.  Pulmonary:     Effort: Pulmonary effort is normal. No respiratory distress.     Breath sounds: Normal breath sounds.     Comments: End expiratory wheezing.  No acute distress.  On 4 L nasal cannula 97%. Abdominal:     General: Abdomen is flat. Bowel sounds are normal.     Palpations: Abdomen is soft.     Tenderness: There is no abdominal tenderness.  Musculoskeletal:     Right lower leg: No edema.  Left lower leg: No edema.     Comments: Patient does have ecchymosis over right and left lower extremity.  She has no significant swelling no pitting edema.  Otherwise normal in appearance and neurovascularly intact.  Skin:    General: Skin is warm and dry.     Findings: No lesion.  Neurological:     General: No focal deficit present.     Mental Status: She is alert and oriented to person, place, and time. Mental status is at baseline.     ED Results / Procedures / Treatments   Labs (all labs ordered are listed, but only abnormal results are displayed) Labs Reviewed  COMPREHENSIVE METABOLIC PANEL WITH GFR - Abnormal; Notable for the following components:      Result Value   Glucose, Bld 144 (*)    BUN 35 (*)    Creatinine, Ser 1.34 (*)    GFR, Estimated 37 (*)    All other components within normal limits  CBC - Abnormal; Notable for the following components:   WBC 15.6 (*)    RBC 3.83 (*)    Hemoglobin 11.6 (*)    MCV 101.6 (*)    MCHC 29.8 (*)    Platelets 405 (*)    All other components within normal limits  BRAIN NATRIURETIC PEPTIDE - Abnormal; Notable for the following components:   B Natriuretic Peptide 428.3 (*)    All other components within normal limits  BLOOD GAS, VENOUS - Abnormal; Notable for the following components:   Bicarbonate 31.8 (*)    Acid-Base Excess 5.2 (*)    All other  components within normal limits  LACTIC ACID, PLASMA - Abnormal; Notable for the following components:   Lactic Acid, Venous 2.6 (*)    All other components within normal limits  LACTIC ACID, PLASMA - Abnormal; Notable for the following components:   Lactic Acid, Venous 2.1 (*)    All other components within normal limits  I-STAT CG4 LACTIC ACID, ED - Abnormal; Notable for the following components:   Lactic Acid, Venous 2.3 (*)    All other components within normal limits  TROPONIN I (HIGH SENSITIVITY) - Abnormal; Notable for the following components:   Troponin I (High Sensitivity) 40 (*)    All other components within normal limits  TROPONIN I (HIGH SENSITIVITY) - Abnormal; Notable for the following components:   Troponin I (High Sensitivity) 51 (*)    All other components within normal limits  RESP PANEL BY RT-PCR (RSV, FLU A&B, COVID)  RVPGX2  CULTURE, BLOOD (ROUTINE X 2)  CULTURE, BLOOD (ROUTINE X 2)  URINALYSIS, ROUTINE W REFLEX MICROSCOPIC  LACTIC ACID, PLASMA  I-STAT CG4 LACTIC ACID, ED    EKG None  Radiology DG Chest Port 1 View Result Date: 07/21/2023 CLINICAL DATA:  Shortness of breath with wheezing and productive cough. EXAM: PORTABLE CHEST 1 VIEW COMPARISON:  Radiographs 06/24/2022 and 04/08/2022.  CT 07/07/2022. FINDINGS: 0805 hours. Left subclavian pacemaker leads appear unchanged, projecting over the right atrium and right ventricle. Stable mild cardiac enlargement and aortic atherosclerosis. There are lower lung volumes with new focal perihilar airspace opacities bilaterally. There is chronic blunting of the left costophrenic angle with a possible small superimposed left pleural effusion. No evidence of pneumothorax. No acute osseous findings are demonstrated. Stable asymmetric glenohumeral degenerative changes on the right and a thoracolumbar scoliosis. IMPRESSION: New focal perihilar airspace opacities bilaterally, suspicious for pneumonia. Possible small left pleural  effusion. Followup PA and lateral chest X-ray is recommended  in 4-6 weeks following appropriate therapy to ensure resolution and exclude underlying malignancy. Electronically Signed   By: Elmon Hagedorn M.D.   On: 07/21/2023 08:21    Procedures Procedures    Medications Ordered in ED Medications  azithromycin (ZITHROMAX) 500 mg in sodium chloride 0.9 % 250 mL IVPB (has no administration in time range)  cefTRIAXone (ROCEPHIN) 1 g in sodium chloride 0.9 % 100 mL IVPB (has no administration in time range)  methylPREDNISolone sodium succinate (SOLU-MEDROL) 40 mg/mL injection 40 mg (has no administration in time range)  ipratropium-albuterol (DUONEB) 0.5-2.5 (3) MG/3ML nebulizer solution 3 mL (3 mLs Nebulization Given 07/21/23 1610)  acetaminophen (TYLENOL) tablet 650 mg (has no administration in time range)    Or  acetaminophen (TYLENOL) suppository 650 mg (has no administration in time range)  ondansetron (ZOFRAN) tablet 4 mg (has no administration in time range)    Or  ondansetron (ZOFRAN) injection 4 mg (has no administration in time range)  albuterol (PROVENTIL) (2.5 MG/3ML) 0.083% nebulizer solution 2.5 mg (has no administration in time range)  Rivaroxaban (XARELTO) tablet 15 mg (has no administration in time range)  ipratropium-albuterol (DUONEB) 0.5-2.5 (3) MG/3ML nebulizer solution 3 mL (3 mLs Nebulization Given 07/21/23 0939)  acetaminophen (TYLENOL) tablet 1,000 mg (1,000 mg Oral Given 07/21/23 1027)  cefTRIAXone (ROCEPHIN) 1 g in sodium chloride 0.9 % 100 mL IVPB (0 g Intravenous Stopped 07/21/23 1153)  azithromycin (ZITHROMAX) 500 mg in sodium chloride 0.9 % 250 mL IVPB (0 mg Intravenous Stopped 07/21/23 1153)  sodium chloride 0.9 % bolus 1,000 mL (0 mLs Intravenous Stopped 07/21/23 1207)    ED Course/ Medical Decision Making/ A&P                                 Medical Decision Making Amount and/or Complexity of Data Reviewed Labs: ordered. Radiology: ordered.  Risk OTC  drugs. Prescription drug management. Decision regarding hospitalization.   This patient presents to the ED for concern of shortness of breath, this involves an extensive number of treatment options, and is a complaint that carries with it a high risk of complications and morbidity.  The differential diagnosis includes CHF, PE, PNA, COPD   Co morbidities that complicate the patient evaluation  Afib on Xarelto COPD   Additional history obtained:  Additional history obtained from OV 07/18/23   Lab Tests:  I personally interpreted labs.  The pertinent results include:   CBC with leukocytosis  Lactic elevated    Imaging Studies ordered:  I ordered imaging studies including chest x-ray   I independently visualized and interpreted imaging which showed pneumonia  I agree with the radiologist interpretation   Cardiac Monitoring: / EKG:  The patient was maintained on a cardiac monitor.     Consultations Obtained:  I requested consultation with the hospital team,  and discussed lab and imaging findings as well as pertinent plan - they recommend: admission    Problem List / ED Course / Critical interventions / Medication management  Reporting to emergency room with complaint of shortness of breath.  She has history of COPD and has had some end expiratory wheezing on exam.  With EMS she was hypoxic which improved with nasal cannula oxygen DuoNeb and steroids.  She does have mildly increased respiratory rate but is satting well on room air and in no acute distress.  Continues to have end expiratory wheezing will repeat DuoNeb.  She is febrile will  give Tylenol.  Given increased respiratory rate and fever will obtain lactic and blood cultures.  Symptoms are less consistent with ACS but will obtain troponin due to chest tightness, chest pain and EKG.  Doubt PE as cause as patient is on Xarelto reports that she has not missed doses.  She has no sign of fluid overload thus doubt CHF at this  time.  She is not hypoxic and has normal heart rate.  Will closely reevaluate. SIRS criteria, will call code sepsis, confirmed pneumonia on x-ray will give Abx and fluid bolus. Will consult for admission.   I ordered medication including Duo Neb Reevaluation of the patient after these medicines showed that the patient improved I have reviewed the patients home medicines and have made adjustments as needed   Plan  Admit for PNA, hypoxia          Final Clinical Impression(s) / ED Diagnoses Final diagnoses:  None    Rx / DC Orders ED Discharge Orders     None         Eudora Heron, PA-C 07/21/23 1615    Wynetta Heckle, MD 07/22/23 [phone]

## 2023-07-21 NOTE — Sepsis Progress Note (Signed)
 Notified provider and bedside nurse of need to order and draw repeat lactic acid # 3 at 1400.

## 2023-07-21 NOTE — ED Notes (Signed)
 ED TO INPATIENT HANDOFF REPORT  Name/Age/Gender Jody Blake 89 y.o. female  Code Status    Code Status Orders  (From admission, onward)           Start     Ordered   07/21/23 1312  Do not attempt resuscitation (DNR) Pre-Arrest Interventions Desired  Continuous       Question Answer Comment  If pulseless and not breathing No CPR or chest compressions.   In Pre-Arrest Conditions (Patient Has Pulse and Is Breathing) May intubate, use advanced airway interventions and cardioversion/ACLS medications if appropriate or indicated. May transfer to ICU.   Consent: Discussion documented in EHR or advanced directives reviewed      07/21/23 1312           Code Status History     Date Active Date Inactive Code Status Order ID Comments User Context   04/08/2022 1359 04/11/2022 1630 DNR [id number]  Malka Sea, DO ED   05/04/2021 0828 03/06/2022 1123 DNR [id number]  Raylene Calamity, NP Outpatient   04/12/2021 0415 04/19/2021 0008 DNR [id number]  Angelene Kelly, MD Inpatient   01/12/2020 1524 08/30/2020 0959 DNR [id number]  Sarrah Cure Outpatient   10/27/2019 2248 10/29/2019 1809 DNR [id number]  Danice Dural, MD ED   07/07/2019 1536 10/27/2019 1542 DNR [id number]  Anthoney Kipper L Outpatient   07/18/2017 1510 07/02/2019 2354 DNR [id number] Discussed at clinic visit, scanned copy should be in documents and media Sarrah Cure Outpatient   06/25/2017 0154 06/26/2017 1428 DNR [id number]  Angelene Kelly, MD Inpatient   11/14/2016 2322 11/15/2016 2132 DNR [id number]  Fidencio Hue, MD ED   05/10/2014 1704 05/11/2014 1442 Full Code [id number]  Jolly Needle, MD Inpatient   11/30/2013 1722 12/03/2013 1529 Full Code [id number]  Jolly Needle, MD Inpatient   09/06/2012 0356 09/08/2012 1820 Full Code [id number]  Kathlen Para, MD ED      Advance Directive Documentation    Flowsheet Row Most Recent Value  Type of Advance Directive Healthcare Power of Attorney, Living will  Pre-existing out of facility  DNR order (yellow form or pink MOST form) --  "MOST" Form in Place? --       Home/SNF/Other Skilled nursing facility  Chief Complaint CAP (community acquired pneumonia) [J18.9] Sepsis due to pneumonia (HCC) [J18.9, A41.9]  Level of Care/Admitting Diagnosis ED Disposition     ED Disposition  Admit   Condition  --   Comment  Hospital Area: [facility] [[id number]]  Level of Care: Progressive [102]  Admit to Progressive based on following criteria: MULTISYSTEM THREATS such as stable sepsis, metabolic/electrolyte imbalance with or without encephalopathy that is responding to early treatment.  May admit patient to [facility] or [facility] if equivalent level of care is available:: Yes  Covid Evaluation: Confirmed COVID Negative  Diagnosis: Sepsis due to pneumonia [facility]) [[id number]]  Admitting Physician: Gaylin Ke [[id number]]  Attending Physician: Jannette Mend, Woodie Hazard [[id number]]  Certification:: I certify this patient will need inpatient services for at least 2 midnights  Expected Medical Readiness: 07/23/2023          Medical History Past Medical History:  Diagnosis Date   Anemia    Aortic stenosis    a. Echo 09/06/12 EF 55-60%, no WMA, G2DD, Ao valve sclerosis w/ mod stenosis, LA mildly dilated, PA pressure   Asthmatic bronchitis    Complete heart block (HCC)    s/p permanent pacemaker 06/27/1999 (Battery change 06/2007 and 2016).  s/p  AV nodal ablation by Dr Nunzio Belch 2016.   COPD (chronic obstructive pulmonary disease) (HCC)    Dr. Linder Revere   DDD (degenerative disc disease)    Diastolic dysfunction    a. Echo 09/06/12 EF 55-60%, no WMA, G2DD, Ao valve sclerosis w/ mod stenosis, LA mildly dilated, PA pressure   Diverticulosis    DVT (deep vein thrombosis) in pregnancy 1954   a. LLE   Hypertension    Hypothyroidism    on medication   Macular degeneration    Dr. Alean Hunt   Osteoarthrosis, unspecified whether generalized or localized, other specified  sites    PAF (paroxysmal atrial fibrillation) (HCC)    Stopped flecainide, on amiodarone but still has bouts of A FIB (mostly in mornings).    Pure hypercholesterolemia    Staghorn calculus    Left    Allergies Allergies  Allergen Reactions   Combigan [Brimonidine Tartrate-Timolol] Other (See Comments)    Systemic malaise amigen eye drop   Tape Other (See Comments)    SKIN IS VERY FRAGILE!!   Penicillins Hives   Breo Ellipta [Fluticasone Furoate-Vilanterol] Other (See Comments) and Hypertension    Increased blood pressure   Fluticasone    Other     SEASONAL ALLERGIES   Timolol    Vancomycin Other (See Comments)    Red man syndrome - Mild redness and discomfort. Able to complete initial dose.     IV Location/Drains/Wounds Patient Lines/Drains/Airways Status     Active Line/Drains/Airways     Name Placement date Placement time Site Days   Peripheral IV 07/21/23 20 G Right Antecubital 07/21/23  0923  Antecubital  less than 1            Labs/Imaging Results for orders placed or performed during the hospital encounter of 07/21/23 (from the past 48 hours)  Resp panel by RT-PCR (RSV, Flu A&B, Covid) Anterior Nasal Swab     Status: None   Collection Time: 07/21/23  9:24 AM   Specimen: Anterior Nasal Swab  Result Value Ref Range   SARS Coronavirus 2 by RT PCR NEGATIVE NEGATIVE    Comment: (NOTE) SARS-CoV-2 target nucleic acids are NOT DETECTED.  The SARS-CoV-2 RNA is generally detectable in upper respiratory specimens during the acute phase of infection. The lowest concentration of SARS-CoV-2 viral copies this assay can detect is 138 copies/mL. A negative result does not preclude SARS-Cov-2 infection and should not be used as the sole basis for treatment or other patient management decisions. A negative result may occur with  improper specimen collection/handling, submission of specimen other than nasopharyngeal swab, presence of viral mutation(s) within the areas  targeted by this assay, and inadequate number of viral copies(<138 copies/mL). A negative result must be combined with clinical observations, patient history, and epidemiological information. The expected result is Negative.  Fact Sheet for Patients:  BloggerCourse.com  Fact Sheet for Healthcare Providers:  SeriousBroker.it  This test is no t yet approved or cleared by the [place]  FDA and  has been authorized for detection and/or diagnosis of SARS-CoV-2 by FDA under an Emergency Use Authorization (EUA). This EUA will remain  in effect (meaning this test can be used) for the duration of the COVID-19 declaration under Section 564(b)(1) of the Act, 21 U.S.C.section 360bbb-3(b)(1), unless the authorization is terminated  or revoked sooner.       Influenza A by PCR NEGATIVE NEGATIVE   Influenza B by PCR NEGATIVE NEGATIVE    Comment: (NOTE) The Xpert Xpress SARS-CoV-2/FLU/RSV plus assay  is intended as an aid in the diagnosis of influenza from Nasopharyngeal swab specimens and should not be used as a sole basis for treatment. Nasal washings and aspirates are unacceptable for Xpert Xpress SARS-CoV-2/FLU/RSV testing.  Fact Sheet for Patients: BloggerCourse.com  Fact Sheet for Healthcare Providers: SeriousBroker.it  This test is not yet approved or cleared by the [place] FDA and has been authorized for detection and/or diagnosis of SARS-CoV-2 by FDA under an Emergency Use Authorization (EUA). This EUA will remain in effect (meaning this test can be used) for the duration of the COVID-19 declaration under Section 564(b)(1) of the Act, 21 U.S.C. section 360bbb-3(b)(1), unless the authorization is terminated or revoked.     Resp Syncytial Virus by PCR NEGATIVE NEGATIVE    Comment: (NOTE) Fact Sheet for Patients: BloggerCourse.com  Fact Sheet for  Healthcare Providers: SeriousBroker.it  This test is not yet approved or cleared by the [place] FDA and has been authorized for detection and/or diagnosis of SARS-CoV-2 by FDA under an Emergency Use Authorization (EUA). This EUA will remain in effect (meaning this test can be used) for the duration of the COVID-19 declaration under Section 564(b)(1) of the Act, 21 U.S.C. section 360bbb-3(b)(1), unless the authorization is terminated or revoked.  Performed at [facility], [address], [place], [place] [address]   Comprehensive metabolic panel     Status: Abnormal   Collection Time: 07/21/23  9:24 AM  Result Value Ref Range   Sodium 145 135 - 145 mmol/L   Potassium 3.5 3.5 - 5.1 mmol/L   Chloride 105 98 - 111 mmol/L   CO2 26 22 - 32 mmol/L   Glucose, Bld 144 (H) 70 - 99 mg/dL    Comment: Glucose reference range applies only to samples taken after fasting for at least 8 hours.   BUN 35 (H) 8 - 23 mg/dL   Creatinine, Ser 1.91 (H) 0.44 - 1.00 mg/dL   Calcium 47.8 8.9 - 29.5 mg/dL   Total Protein 7.4 6.5 - 8.1 g/dL   Albumin 3.5 3.5 - 5.0 g/dL   AST 20 15 - 41 U/L   ALT 13 0 - 44 U/L   Alkaline Phosphatase 64 38 - 126 U/L   Total Bilirubin 0.8 0.0 - 1.2 mg/dL   GFR, Estimated 37 (L) >60 mL/min    Comment: (NOTE) Calculated using the CKD-EPI Creatinine Equation (2021)    Anion gap 14 5 - 15    Comment: Performed at [facility], [address], [place], [place] [address]  CBC     Status: Abnormal   Collection Time: 07/21/23  9:24 AM  Result Value Ref Range   WBC 15.6 (H) 4.0 - 10.5 K/uL   RBC 3.83 (L) 3.87 - 5.11 MIL/uL   Hemoglobin 11.6 (L) 12.0 - 15.0 g/dL   HCT 86.5 78.4 - 69.6 %   MCV 101.6 (H) 80.0 - 100.0 fL   MCH 30.3 26.0 - 34.0 pg   MCHC 29.8 (L) 30.0 - 36.0 g/dL   RDW 29.5 28.4 - 13.2 %   Platelets 405 (H) 150 - 400 K/uL   nRBC 0.0 0.0 - 0.2 %    Comment: Performed at [facility], [address], [place], [place] [address]  Troponin I (High Sensitivity)     Status: Abnormal   Collection Time: 07/21/23  9:24 AM  Result Value Ref Range   Troponin I (High Sensitivity) 40 (H) <18 ng/L    Comment: (  NOTE) Elevated high sensitivity troponin I (hsTnI) values and significant  changes across serial measurements may suggest ACS but many other  chronic and acute conditions are known to elevate hsTnI results.  Refer to the "Links" section for chest pain algorithms and additional  guidance. Performed at [facility], [address], [place], [place] [address]   Brain natriuretic peptide     Status: Abnormal   Collection Time: 07/21/23  9:24 AM  Result Value Ref Range   B Natriuretic Peptide 428.3 (H) 0.0 - 100.0 pg/mL    Comment: Performed at [facility], [address], [place], [place] [address]  Blood gas, venous (at [facility])     Status: Abnormal   Collection Time: 07/21/23  9:24 AM  Result Value Ref Range   pH, Ven 7.37 7.25 - 7.43   pCO2, Ven 55 44 - 60 mmHg   pO2, Ven 32 32 - 45 mmHg   Bicarbonate 31.8 (H) 20.0 - 28.0 mmol/L   Acid-Base Excess 5.2 (H) 0.0 - 2.0 mmol/L   O2 Saturation 54.1 %   Patient temperature 37.0     Comment: Performed at [facility], [address], [place], [place] [address]  I-Stat CG4 Lactic Acid     Status: Abnormal   Collection Time: 07/21/23  9:38 AM  Result Value Ref Range   Lactic Acid, Venous 2.3 (HH) 0.5 - 1.9 mmol/L   Comment NOTIFIED PHYSICIAN   Troponin I (High Sensitivity)     Status: Abnormal   Collection Time: 07/21/23 11:54 AM  Result Value Ref Range   Troponin I (High Sensitivity) 51 (H) <18 ng/L    Comment: (NOTE) Elevated high sensitivity troponin I (hsTnI) values and significant  changes across serial measurements may suggest ACS but many other  chronic and acute conditions are known to elevate hsTnI results.  Refer to the "Links" section for  chest pain algorithms and additional  guidance. Performed at [facility], [address] [place], [place] [address]   Lactic acid, plasma     Status: Abnormal   Collection Time: 07/21/23 11:54 AM  Result Value Ref Range   Lactic Acid, Venous 2.6 (HH) 0.5 - 1.9 mmol/L    Comment: CRITICAL RESULT CALLED TO, READ BACK BY AND VERIFIED WITH A. BROWN,RN ON 07/21/2023 AT 1316 BY SL Performed at [facility], [address], [place], [place] [address]    DG Chest Port 1 View Result Date: 07/21/2023 CLINICAL DATA:  Shortness of breath with wheezing and productive cough. EXAM: PORTABLE CHEST 1 VIEW COMPARISON:  Radiographs 06/24/2022 and 04/08/2022.  CT 07/07/2022. FINDINGS: 0805 hours. Left subclavian pacemaker leads appear unchanged, projecting over the right atrium and right ventricle. Stable mild cardiac enlargement and aortic atherosclerosis. There are lower lung volumes with new focal perihilar airspace opacities bilaterally. There is chronic blunting of the left costophrenic angle with a possible small superimposed left pleural effusion. No evidence of pneumothorax. No acute osseous findings are demonstrated. Stable asymmetric glenohumeral degenerative changes on the right and a thoracolumbar scoliosis. IMPRESSION: New focal perihilar airspace opacities bilaterally, suspicious for pneumonia. Possible small left pleural effusion. Followup PA and lateral chest X-ray is recommended in 4-6 weeks following appropriate therapy to ensure resolution and exclude underlying malignancy. Electronically Signed   By: Elmon Hagedorn M.D.   On: 07/21/2023 08:21    Pending Labs [employer] (From admission, onward)     Start     Ordered  07/22/23 0500  Basic metabolic panel  Tomorrow morning,   R        07/21/23 1312   07/22/23 0500  CBC  Tomorrow morning,   R        07/21/23 1312   07/21/23 1324  Lactic acid, plasma  (Lactic Acid)  Now then every 2 hours,   R       07/21/23 1323   07/21/23 1014  Urinalysis, Routine w reflex microscopic -Urine, Clean Catch  Once,   URGENT       Question:  Specimen Source  Answer:  Urine, Clean Catch   07/21/23 1013   07/21/23 0754  Blood culture (routine x 2)  BLOOD CULTURE X 2,   R      07/21/23 0753            Vitals/Pain Today's Vitals   07/21/23 1030 07/21/23 1137 07/21/23 1215 07/21/23 1300  BP: 139/69  (!) 140/48 (!) 147/60  Pulse: 60  (!) 59 (!) 59  Resp: 17  (!) 22 (!) 24  Temp:  97.8 F (36.6 C)    TempSrc:  Oral    SpO2: 96%  99% 94%  PainSc:        Isolation Precautions No active isolations  Medications Medications  azithromycin (ZITHROMAX) 500 mg in sodium chloride 0.9 % 250 mL IVPB (has no administration in time range)  cefTRIAXone (ROCEPHIN) 1 g in sodium chloride 0.9 % 100 mL IVPB (has no administration in time range)  methylPREDNISolone sodium succinate (SOLU-MEDROL) 40 mg/mL injection 40 mg (has no administration in time range)  ipratropium-albuterol (DUONEB) 0.5-2.5 (3) MG/3ML nebulizer solution 3 mL (has no administration in time range)  acetaminophen (TYLENOL) tablet 650 mg (has no administration in time range)    Or  acetaminophen (TYLENOL) suppository 650 mg (has no administration in time range)  ondansetron (ZOFRAN) tablet 4 mg (has no administration in time range)    Or  ondansetron (ZOFRAN) injection 4 mg (has no administration in time range)  albuterol (PROVENTIL) (2.5 MG/3ML) 0.083% nebulizer solution 2.5 mg (has no administration in time range)  Rivaroxaban (XARELTO) tablet 15 mg (has no administration in time range)  ipratropium-albuterol (DUONEB) 0.5-2.5 (3) MG/3ML nebulizer solution 3 mL (3 mLs Nebulization Given 07/21/23 0939)  acetaminophen (TYLENOL) tablet 1,000 mg (1,000 mg Oral Given 07/21/23 1027)  cefTRIAXone (ROCEPHIN) 1 g in sodium chloride 0.9 % 100 mL IVPB (0 g Intravenous Stopped 07/21/23 1153)  azithromycin (ZITHROMAX) 500 mg in sodium chloride 0.9 % 250 mL  IVPB (0 mg Intravenous Stopped 07/21/23 1153)  sodium chloride 0.9 % bolus 1,000 mL (0 mLs Intravenous Stopped 07/21/23 1207)    Mobility walks with device

## 2023-07-21 NOTE — Sepsis Progress Note (Signed)
 Elink monitoring for the code sepsis protocol.

## 2023-07-21 NOTE — ED Triage Notes (Signed)
 Pt BIB EMS from Home due to SOB this morning; wheezing hear on expiratory. Pt c/o left flank pain that hurts when she breaths with a productive cough and yellow sputum. Hx of COPD; pt has a Visual merchandiser. Pt report she is blind and has hearing difficulties at baseline.  In Route 18 ga IV LAC 125 mg Solu-Medrol IV 5 mg Albuterol Neb Atrovent Neb SpO2 84% RA SpO2 97% 8L Neb

## 2023-07-21 NOTE — H&P (Addendum)
 History and Physical  Jody Blake NWG:[id number] DOB: 06/14/1925 DOA: 07/21/2023  PCP: Marguerite Shiley, MD   Chief Complaint: Cough, shortness of breath  HPI: Jody Blake is a 89 y.o. female with medical history significant for COPD on room air, diastolic dysfunction, history of DVT and paroxysmal atrial fibrillation on Xarelto being admitted to the hospital with sepsis due to community-acquired pneumonia.  History is provided by the patient's full-time caregiver as well as as her daughter who is at the bedside.  State that at baseline, patient is on room air, she is essentially blind due to macular degeneration and glaucoma, and is incredibly hard of hearing.  However they state that they are able to communicate with her, and she is of sharp mental status.  In any case, starting about 5 days ago, the patient was having some cough, feeling chest pressure and shortness of breath.  3 days ago, she went to her PCP and was prescribed prednisone.  She started taking the prednisone 2 days ago, however today she has worsened chest pain and shortness of breath.  EMS was called to her assisted living facility, she was found to be saturating 84% on room air.  She was placed on oxygen, given IV Solu-Medrol and breathing treatments and brought to the ER for evaluation.  Workup as noted below shows evidence of acute exacerbation of COPD, as well as community-acquired pneumonia.  Review of Systems: Please see HPI for pertinent positives and negatives. A complete 10 system review of systems are otherwise negative.  Past Medical History:  Diagnosis Date   Anemia    Aortic stenosis    a. Echo 09/06/12 EF 55-60%, no WMA, G2DD, Ao valve sclerosis w/ mod stenosis, LA mildly dilated, PA pressure   Asthmatic bronchitis    Complete heart block (HCC)    s/p permanent pacemaker 06/27/1999 (Battery change 06/2007 and 2016).  s/p AV nodal ablation by Dr Nunzio Belch 2016.   COPD (chronic obstructive pulmonary disease)  (HCC)    Dr. Linder Revere   DDD (degenerative disc disease)    Diastolic dysfunction    a. Echo 09/06/12 EF 55-60%, no WMA, G2DD, Ao valve sclerosis w/ mod stenosis, LA mildly dilated, PA pressure   Diverticulosis    DVT (deep vein thrombosis) in pregnancy 1954   a. LLE   Hypertension    Hypothyroidism    on medication   Macular degeneration    Dr. Alean Hunt   Osteoarthrosis, unspecified whether generalized or localized, other specified sites    PAF (paroxysmal atrial fibrillation) (HCC)    Stopped flecainide, on amiodarone but still has bouts of A FIB (mostly in mornings).    Pure hypercholesterolemia    Staghorn calculus    Left   Past Surgical History:  Procedure Laterality Date   APPENDECTOMY  ~ 1941   AV NODE ABLATION  05/10/2014   AV NODE ABLATION N/A 05/10/2014   Procedure: AV NODE ABLATION;  Surgeon: Jolly Needle, MD;  Location: [facility] CATH LAB;  Service: Cardiovascular;  Laterality: N/A;   BUNIONECTOMY WITH HAMMERTOE RECONSTRUCTION Bilateral ~ 1990   CARDIAC PACEMAKER PLACEMENT  06/27/99   Medtronic PM implanted by Dr Dortha Gauss   CARDIOVERSION N/A 12/02/2013   Procedure: CARDIOVERSION;  Surgeon: Elmyra Haggard, MD;  Location: [facility] ENDOSCOPY;  Service: Cardiovascular;  Laterality: N/A;   CATARACT EXTRACTION W/ INTRAOCULAR LENS  IMPLANT, BILATERAL Bilateral    COLONOSCOPY WITH PROPOFOL N/A 04/22/2013   Procedure: COLONOSCOPY WITH PROPOFOL;  Surgeon: Garrett Kallman,  MD;  Location: [facility] ENDOSCOPY;  Service: Endoscopy;  Laterality: N/A;   DILATION AND CURETTAGE OF UTERUS  X 2   "when I was going thru menopause"   ESOPHAGOGASTRODUODENOSCOPY (EGD) WITH PROPOFOL N/A 04/22/2013   Procedure: ESOPHAGOGASTRODUODENOSCOPY (EGD) WITH PROPOFOL;  Surgeon: Garrett Kallman, MD;  Location: [facility] ENDOSCOPY;  Service: Endoscopy;  Laterality: N/A;   HERNIA REPAIR     INCISIONAL HERNIA REPAIR     INSERT / REPLACE / REMOVE PACEMAKER  06/2007   "took out the old; put in new"   INSERT / REPLACE / REMOVE PACEMAKER   05/10/2014   MDT PPM generator change by Dr Nunzio Belch   JOINT REPLACEMENT     PARTIAL NEPHRECTOMY Left 05/1974   stone disease   PERMANENT PACEMAKER GENERATOR CHANGE N/A 05/10/2014   Procedure: PERMANENT PACEMAKER GENERATOR CHANGE;  Surgeon: Jolly Needle, MD;  Location: [facility] CATH LAB;  Service: Cardiovascular;  Laterality: N/A;   TONSILLECTOMY AND ADENOIDECTOMY  1930's   TOTAL KNEE ARTHROPLASTY Right 2001   Social History:  reports that she has never smoked. She has never used smokeless tobacco. She reports current alcohol use of about 7.0 standard drinks of alcohol per week. She reports that she does not use drugs.  Allergies  Allergen Reactions   Combigan [Brimonidine Tartrate-Timolol] Other (See Comments)    Systemic malaise amigen eye drop   Tape Other (See Comments)    SKIN IS VERY FRAGILE!!   Penicillins Hives   Breo Ellipta [Fluticasone Furoate-Vilanterol] Other (See Comments) and Hypertension    Increased blood pressure   Fluticasone    Other     SEASONAL ALLERGIES   Timolol    Vancomycin Other (See Comments)    Red man syndrome - Mild redness and discomfort. Able to complete initial dose.     Family History  Problem Relation Age of Onset   Malignant hypertension Father    Hypertension Father    Renal Disease Father    Breast cancer Mother    Heart attack Brother    Stroke Brother      Prior to Admission medications   Medication Sig Start Date End Date Taking? Authorizing Provider  albuterol (VENTOLIN HFA) 108 (90 Base) MCG/ACT inhaler Inhale 2 puffs into the lungs every 6 (six) hours as needed for wheezing or shortness of breath.    [provider]  Azelastine HCl 137 MCG/SPRAY SOLN USE 1 TO 2 SPRAYS INTO BOTH NOSTRILS TWICE DAILY. 09/24/21   Raylene Calamity, NP  Calcium 600-10 MG-MCG CHEW Chew 1 capsule by mouth daily. 12/12/21   Gupta, Anjali L, MD  Cholecalciferol (VITAMIN D) 125 MCG (5000 UT) CAPS Take 1 capsule by mouth daily. 11/28/21   Gupta, Anjali L, MD   dorzolamide (TRUSOPT) 2 % ophthalmic solution Place 1 drop into both eyes 2 (two) times daily.    [provider]  famotidine (PEPCID) 40 MG tablet Take 1 tablet (40 mg total) by mouth at bedtime. 11/28/21   Gupta, Anjali L, MD  fexofenadine (ALLEGRA) 180 MG tablet Take 180 mg by mouth daily.    [provider]  fluticasone (FLONASE) 50 MCG/ACT nasal spray USE 2 SPRAYS EACH NOSTRIL ONCE A DAY AS NEEDED FOR ALLERGIES OR CONGESTION. 01/04/21   Rosa [employer] D, MD  gabapentin (NEURONTIN) 100 MG capsule Take 200 mg by mouth at bedtime.    [provider]  guaiFENesin (MUCINEX) 600 MG 12 hr tablet Take 600 mg by mouth daily. And prn    [provider]  ipratropium-albuterol (DUONEB) 0.5-2.5 (3) MG/3ML SOLN Take 3 mLs by nebulization at bedtime. 07/04/22   Raylene Calamity, NP  ipratropium-albuterol (DUONEB) 0.5-2.5 (3) MG/3ML SOLN Take 3 mLs by nebulization every 6 (six) hours as needed. 06/02/23   Wert, Christina, NP  latanoprost (XALATAN) 0.005 % ophthalmic solution Place 1 drop into both eyes at bedtime.    [provider]  levothyroxine (SYNTHROID) 75 MCG tablet Take 1 tablet (75 mcg total) by mouth daily. 08/06/21   Raylene Calamity, NP  losartan (COZAAR) 50 MG tablet Take 1 tablet (50 mg total) by mouth daily. 11/22/21   Raylene Calamity, NP  Lutein 20 MG CAPS Take 1 capsule (20 mg total) by mouth daily. 07/23/21   Gupta, Anjali L, MD  Magnesium 500 MG TABS Take 750 mg by mouth See admin instructions. Take 750 mg by mouth every evening (in conjunction with 3 BioComplete capsules)    [provider]  metoprolol tartrate (LOPRESSOR) 50 MG tablet Take 1.5 tablets (75 mg total) by mouth in the morning. 07/24/21   Gupta, Anjali L, MD  mometasone-formoterol (DULERA) 100-5 MCG/ACT AERO Inhale 2 puffs then rinse mouth, twice daily 06/24/22   Rosa [employer] D, MD  montelukast (SINGULAIR) 10 MG tablet Take 1 tablet (10 mg total) by mouth daily. 07/24/21   Marguerite Shiley, MD  Multiple Vitamins-Minerals (PRESERVISION AREDS 2) CAPS Take 1 capsule by mouth in the morning and at bedtime. 07/24/21   Marguerite Shiley, MD  NON FORMULARY Take 3 capsules by mouth See admin instructions. Gundry MD Bio Complete 3 - Prebiotic, Probiotic, Postbiotic to Support Optimal Gut Health capsules- TAKE 3 CAPSULES BY MOUTH EVERY EVENING (in conjunction with 750 mg Magnesium)    [provider]  potassium chloride (KLOR-CON) 10 MEQ tablet Take 2 tablets (20 mEq total) by mouth daily. 08/06/21   Raylene Calamity, NP  potassium chloride (KLOR-CON) 10 MEQ tablet Take 10 mEq by mouth daily.    [provider]  predniSONE (DELTASONE) 20 MG tablet Take 2 tablets (40 mg total) by mouth daily with breakfast for 3 days, THEN 1 tablet (20 mg total) daily with breakfast for 3 days, THEN 0.5 tablets (10 mg total) daily with breakfast for 1 day, THEN 0.5 tablets (10 mg total) daily with breakfast for 1 day. 07/18/23 07/26/23  Medina-Vargas, Monina C, NP  predniSONE (DELTASONE) 5 MG tablet Take 0.5 tablets (2.5 mg total) by mouth daily with breakfast. 07/26/23   Medina-Vargas, Monina C, NP  Rivaroxaban (XARELTO) 15 MG TABS tablet Take 15 mg by mouth daily.    [provider]  torsemide (DEMADEX) 20 MG tablet Take 1 tablet (20 mg total) by mouth daily as needed. 3 lb weight gain in 1 day or 5 lb in 1 week or excessive edema 05/21/22   Fargo, Amy E, NP  torsemide (DEMADEX) 20 MG tablet Take 1.5 tablets (30 mg total) by mouth daily. 07/04/22   Raylene Calamity, NP  TYLENOL 500 MG tablet Take 1,000 mg by mouth in the morning, at noon, and at bedtime.    [provider]    Physical Exam: BP (!) 140/48   Pulse (!) 59   Temp 97.8 F (36.6 C) (Oral)   Resp (!) 22   LMP 04/08/1974   SpO2 99%  General: Alert, eyes closed.  Responds to tactile stimulus.  Wearing 4 L nasal cannula and looks comfortable. Cardiovascular: RRR, no murmurs or rubs, no peripheral edema   Respiratory: clear to auscultation bilaterally though  diminished globally, no wheezes, no crackles  Abdomen: soft, nontender, nondistended, normal bowel tones heard  Skin: dry, no rashes  Musculoskeletal: no joint effusions, normal range of motion  Psychiatric: appropriate affect, normal speech  Neurologic: extraocular muscles intact, clear speech, moving all extremities with intact sensorium         Labs on Admission:  Basic Metabolic Panel: Recent Labs  Lab 07/21/23 0924  NA 145  K 3.5  CL 105  CO2 26  GLUCOSE 144*  BUN 35*  CREATININE 1.34*  CALCIUM 10.2   Liver Function Tests: Recent Labs  Lab 07/21/23 0924  AST 20  ALT 13  ALKPHOS 64  BILITOT 0.8  PROT 7.4  ALBUMIN 3.5   No results for input(s): "LIPASE", "AMYLASE" in the last 168 hours. No results for input(s): "AMMONIA" in the last 168 hours. CBC: Recent Labs  Lab 07/21/23 0924  WBC 15.6*  HGB 11.6*  HCT 38.9  MCV 101.6*  PLT 405*   Cardiac Enzymes: No results for input(s): "CKTOTAL", "CKMB", "CKMBINDEX", "TROPONINI" in the last 168 hours. BNP (last 3 results) Recent Labs    07/21/23 0924  BNP 428.3*    ProBNP (last 3 results) No results for input(s): "PROBNP" in the last 8760 hours.  CBG: No results for input(s): "GLUCAP" in the last 168 hours.  Radiological Exams on Admission: DG Chest Port 1 View Result Date: 07/21/2023 CLINICAL DATA:  Shortness of breath with wheezing and productive cough. EXAM: PORTABLE CHEST 1 VIEW COMPARISON:  Radiographs 06/24/2022 and 04/08/2022.  CT 07/07/2022. FINDINGS: 0805 hours. Left subclavian pacemaker leads appear unchanged, projecting over the right atrium and right ventricle. Stable mild cardiac enlargement and aortic atherosclerosis. There are lower lung volumes with new focal perihilar airspace opacities bilaterally. There is chronic blunting of the left costophrenic angle with a possible small superimposed left pleural effusion. No evidence of pneumothorax.  No acute osseous findings are demonstrated. Stable asymmetric glenohumeral degenerative changes on the right and a thoracolumbar scoliosis. IMPRESSION: New focal perihilar airspace opacities bilaterally, suspicious for pneumonia. Possible small left pleural effusion. Followup PA and lateral chest X-ray is recommended in 4-6 weeks following appropriate therapy to ensure resolution and exclude underlying malignancy. Electronically Signed   By: Carey Bullocks M.D.   On: 07/21/2023 08:21   Assessment/Plan Jody Blake is a 89 y.o. female with medical history significant for COPD on room air, diastolic dysfunction, history of DVT and paroxysmal atrial fibrillation on Xarelto being admitted to the hospital with sepsis due to community-acquired pneumonia.    Severe sepsis-meeting criteria with tachycardia, leukocytosis, fever, lactate greater than 2.  Source is community-acquired pneumonia. -Inpatient admission - Got 1 L of fluids, will continue gentle hydration but caution due to her heart failure -Continue to trend lactate -Treat pneumonia as below  Community-acquired pneumonia-causing severe sepsis as noted above -Empiric IV azithromycin and IV Rocephin  Acute exacerbation of COPD-suspected due to increased cough, report of wheezing, hypoxia.  Likely instigated by community-acquired pneumonia. -Supplemental oxygen as needed, wean as tolerated with goal O2 saturation greater than 90% -Scheduled DuoNebs, as needed albuterol inhaler -Incentive spirometer and flutter valve  Atrial fibrillation-continue home Xarelto as well as Lopressor  Heart failure with preserved EF-patient overall appears euvolemic, will continue her home torsemide  GERD-Pepcid  Neuropathy-gabapentin at bedtime  Hypertension-losartan    Code Status: Do not attempt resuscitation (DNR) PRE-ARREST INTERVENTIONS DESIRED, confirmed with patient's daughter at the bedside at the time of admission.  Consults called:  None  Admission  status: The appropriate patient status for this patient is INPATIENT. Inpatient status is judged to be reasonable and necessary in order to provide the required intensity of service to ensure the patient's safety. The patient's presenting symptoms, physical exam findings, and initial radiographic and laboratory data in the context of their chronic comorbidities is felt to place them at high risk for further clinical deterioration. Furthermore, it is not anticipated that the patient will be medically stable for discharge from the hospital within 2 midnights of admission.    I certify that at the point of admission it is my clinical judgment that the patient will require inpatient hospital care spanning beyond 2 midnights from the point of admission due to high intensity of service, high risk for further deterioration and high frequency of surveillance required  Time spent: 59 minutes  Jacia Sickman Rickey Charm MD [facility] Pager [phone]  If 7PM-7AM, please contact night-coverage [email or web address] Password [email or web address]  07/21/2023, 1:12 PM

## 2023-07-22 DIAGNOSIS — J189 Pneumonia, unspecified organism: Secondary | ICD-10-CM | POA: Diagnosis not present

## 2023-07-22 LAB — BASIC METABOLIC PANEL WITH GFR
Anion gap: 10 (ref 5–15)
BUN: 29 mg/dL — ABNORMAL HIGH (ref 8–23)
CO2: 24 mmol/L (ref 22–32)
Calcium: 9.4 mg/dL (ref 8.9–10.3)
Chloride: 108 mmol/L (ref 98–111)
Creatinine, Ser: 1.11 mg/dL — ABNORMAL HIGH (ref 0.44–1.00)
GFR, Estimated: 46 mL/min — ABNORMAL LOW (ref >60)
Glucose, Bld: 204 mg/dL — ABNORMAL HIGH (ref 70–99)
Potassium: 3.8 mmol/L (ref 3.5–5.1)
Sodium: 142 mmol/L (ref 135–145)

## 2023-07-22 LAB — CBC
HCT: 32.6 % — ABNORMAL LOW (ref 36.0–46.0)
Hemoglobin: 9.6 g/dL — ABNORMAL LOW (ref 12.0–15.0)
MCH: 29.7 pg (ref 26.0–34.0)
MCHC: 29.4 g/dL — ABNORMAL LOW (ref 30.0–36.0)
MCV: 100.9 fL — ABNORMAL HIGH (ref 80.0–100.0)
Platelets: 305 10*3/uL (ref 150–400)
RBC: 3.23 MIL/uL — ABNORMAL LOW (ref 3.87–5.11)
RDW: 15 % (ref 11.5–15.5)
WBC: 12.5 10*3/uL — ABNORMAL HIGH (ref 4.0–10.5)
nRBC: 0.2 % (ref 0.0–0.2)

## 2023-07-22 LAB — PROCALCITONIN: Procalcitonin: 0.31 ng/mL

## 2023-07-22 MED ORDER — ARFORMOTEROL TARTRATE 15 MCG/2ML IN NEBU
15.0000 ug | INHALATION_SOLUTION | Freq: Two times a day (BID) | RESPIRATORY_TRACT | Status: DC
  Administered 2023-07-22 – 2023-07-27 (×11): 15 ug via RESPIRATORY_TRACT
  Filled 2023-07-22 (×11): qty 2

## 2023-07-22 MED ORDER — GUAIFENESIN ER 600 MG PO TB12
600.0000 mg | ORAL_TABLET | Freq: Two times a day (BID) | ORAL | Status: DC
  Administered 2023-07-22 (×2): 600 mg via ORAL
  Filled 2023-07-22 (×2): qty 1

## 2023-07-22 MED ORDER — METOPROLOL TARTRATE 5 MG/5ML IV SOLN: 5.0000 mg | INTRAVENOUS | Status: DC | PRN

## 2023-07-22 MED ORDER — LATANOPROST 0.005 % OP SOLN
1.0000 [drp] | Freq: Every day | OPHTHALMIC | Status: DC
  Administered 2023-07-22 – 2023-07-26 (×5): 1 [drp] via OPHTHALMIC
  Filled 2023-07-22: qty 2.5

## 2023-07-22 MED ORDER — GLUCAGON HCL RDNA (DIAGNOSTIC) 1 MG IJ SOLR: 1.0000 mg | INTRAMUSCULAR | Status: DC | PRN

## 2023-07-22 MED ORDER — GABAPENTIN 100 MG PO CAPS
200.0000 mg | ORAL_CAPSULE | Freq: Every day | ORAL | Status: DC
  Administered 2023-07-22 – 2023-07-26 (×5): 200 mg via ORAL
  Filled 2023-07-22 (×5): qty 2

## 2023-07-22 MED ORDER — DORZOLAMIDE HCL 2 % OP SOLN
1.0000 [drp] | Freq: Two times a day (BID) | OPHTHALMIC | Status: DC
Start: 2023-07-22 — End: 2023-07-27
  Administered 2023-07-22 – 2023-07-27 (×11): 1 [drp] via OPHTHALMIC
  Filled 2023-07-22: qty 10

## 2023-07-22 MED ORDER — HYDRALAZINE HCL 20 MG/ML IJ SOLN
5.0000 mg | Freq: Once | INTRAMUSCULAR | Status: AC
  Administered 2023-07-22: 5 mg via INTRAVENOUS
  Filled 2023-07-22: qty 1

## 2023-07-22 MED ORDER — LOSARTAN POTASSIUM 50 MG PO TABS
50.0000 mg | ORAL_TABLET | Freq: Once | ORAL | Status: AC
Start: 2023-07-22 — End: 2023-07-22
  Administered 2023-07-22: 50 mg via ORAL
  Filled 2023-07-22: qty 1

## 2023-07-22 MED ORDER — TRAZODONE HCL 50 MG PO TABS
50.0000 mg | ORAL_TABLET | Freq: Every evening | ORAL | Status: DC | PRN
  Administered 2023-07-23 – 2023-07-26 (×3): 50 mg via ORAL
  Filled 2023-07-22 (×3): qty 1

## 2023-07-22 MED ORDER — FENTANYL CITRATE PF 50 MCG/ML IJ SOSY
12.5000 ug | PREFILLED_SYRINGE | Freq: Once | INTRAMUSCULAR | Status: AC
  Administered 2023-07-22: 12.5 ug via INTRAVENOUS
  Filled 2023-07-22: qty 1

## 2023-07-22 MED ORDER — SENNOSIDES-DOCUSATE SODIUM 8.6-50 MG PO TABS: 1.0000 | ORAL_TABLET | Freq: Every evening | ORAL | Status: DC | PRN

## 2023-07-22 MED ORDER — HYDRALAZINE HCL 20 MG/ML IJ SOLN
10.0000 mg | INTRAMUSCULAR | Status: DC | PRN
  Administered 2023-07-22 – 2023-07-23 (×2): 10 mg via INTRAVENOUS
  Filled 2023-07-22 (×2): qty 1

## 2023-07-22 MED ORDER — AZITHROMYCIN 250 MG PO TABS
500.0000 mg | ORAL_TABLET | Freq: Every day | ORAL | Status: AC
  Administered 2023-07-23 – 2023-07-25 (×3): 500 mg via ORAL
  Filled 2023-07-22 (×3): qty 2

## 2023-07-22 MED ORDER — METOPROLOL TARTRATE 25 MG PO TABS
75.0000 mg | ORAL_TABLET | Freq: Every day | ORAL | Status: DC
  Administered 2023-07-22 – 2023-07-27 (×6): 75 mg via ORAL
  Filled 2023-07-22 (×6): qty 3

## 2023-07-22 MED ORDER — MONTELUKAST SODIUM 10 MG PO TABS
10.0000 mg | ORAL_TABLET | Freq: Every day | ORAL | Status: DC
  Administered 2023-07-22 – 2023-07-26 (×5): 10 mg via ORAL
  Filled 2023-07-22 (×5): qty 1

## 2023-07-22 MED ORDER — MAGNESIUM GLUCONATE 500 (27 MG) MG PO TABS
750.0000 mg | ORAL_TABLET | Freq: Every evening | ORAL | Status: DC
  Administered 2023-07-22 – 2023-07-24 (×3): 750 mg via ORAL
  Filled 2023-07-22 (×3): qty 2

## 2023-07-22 MED ORDER — REVEFENACIN 175 MCG/3ML IN SOLN
175.0000 ug | Freq: Every day | RESPIRATORY_TRACT | Status: DC
  Administered 2023-07-22 – 2023-07-27 (×6): 175 ug via RESPIRATORY_TRACT
  Filled 2023-07-22 (×6): qty 3

## 2023-07-22 MED ORDER — LEVOTHYROXINE SODIUM 50 MCG PO TABS
75.0000 ug | ORAL_TABLET | Freq: Every day | ORAL | Status: DC
  Administered 2023-07-22 – 2023-07-27 (×6): 75 ug via ORAL
  Filled 2023-07-22 (×6): qty 1

## 2023-07-22 MED ORDER — FAMOTIDINE 20 MG PO TABS
40.0000 mg | ORAL_TABLET | Freq: Every day | ORAL | Status: DC
  Administered 2023-07-22: 40 mg via ORAL
  Filled 2023-07-22: qty 2

## 2023-07-22 MED ORDER — LOSARTAN POTASSIUM 50 MG PO TABS
50.0000 mg | ORAL_TABLET | Freq: Every day | ORAL | Status: DC
  Administered 2023-07-23 – 2023-07-24 (×2): 50 mg via ORAL
  Filled 2023-07-22 (×3): qty 1

## 2023-07-22 MED ORDER — TORSEMIDE 10 MG PO TABS
30.0000 mg | ORAL_TABLET | Freq: Every day | ORAL | Status: DC
  Administered 2023-07-22 – 2023-07-23 (×2): 30 mg via ORAL
  Filled 2023-07-22 (×3): qty 3

## 2023-07-22 MED ORDER — GUAIFENESIN 100 MG/5ML PO LIQD
5.0000 mL | ORAL | Status: DC | PRN
  Administered 2023-07-24 – 2023-07-25 (×3): 5 mL via ORAL
  Filled 2023-07-22 (×3): qty 10

## 2023-07-22 MED ORDER — ALUM & MAG HYDROXIDE-SIMETH 200-200-20 MG/5ML PO SUSP
30.0000 mL | ORAL | Status: AC | PRN
  Administered 2023-07-22 – 2023-07-24 (×2): 30 mL via ORAL
  Filled 2023-07-22 (×2): qty 30

## 2023-07-22 NOTE — Hospital Course (Addendum)
 Brief Narrative:   89 year old with history of COPD on room air, diastolic dysfunction, DVT, paroxysmal A-fib on Xarelto , legally blind, glaucoma, hard of hearing admitted to the hospital for sepsis secondary to community-acquired pneumonia.  Patient started having URI type of symptoms about 5 days ago and PCP had placed her on prednisone  2 days prior to hospitalization.  Upon admission noted to have hypoxia therefore started on steroids and bronchodilators.  She is also required gentle diuresis.  Assessment & Plan:  Principal Problem:   CAP (community acquired pneumonia) Active Problems:   Sepsis due to pneumonia (HCC)   Severe sepsis secondary to community-acquired pneumonia COPD exacerbation - Currently started on Rocephin  and azithromycin  - COVID, RSV, flu, = neg - Procalcitonin - 0.31, BNP 428 - Continue steroids - Bronchodilators, I-S/flutter valve  Essential hypertension; uncontrolled -Started Norvasc .  Increase Losartan .  IV as needed.   Acute on congestive heart failure exacerbation with preserved EF - 1 more dose of IV Lasix  today, will resume scheduled torsemide  tomorrow - Continue losartan  and metoprolol   Atrial fibrillation, chronic - Lopressor  and Xarelto .  IV as needed   GERD - Pepcid   Peripheral neuropathy - Bedtime gabapentin    Hypothyroidism - Synthroid   Hypokalemia/hypophosphatemia - Replacement   DVT prophylaxis: Rivaroxaban  (XARELTO ) tablet 15 mg     Code Status: Do not attempt resuscitation (DNR) PRE-ARREST INTERVENTIONS DESIRED Family Communication: Caregiver at bedside Status is: Inpatient Remains inpatient appropriate because: Continue hospital stay for treatment of sepsis secondary to CAP Expect atleast 48 hrs of hosp stay   Subjective: Still feeling quite weak today.  Sitting up in the chair Overall tells me she feels slightly worse than yesterday in terms of her congestion  Examination:  General exam: Appears calm and comfortable,  elderly frail Respiratory system: diffuse rhonchi, slightly worse than yesterday Cardiovascular system: S1 & S2 heard, RRR. No JVD, murmurs, rubs, gallops or clicks. No pedal edema. Gastrointestinal system: Abdomen is nondistended, soft and nontender. No organomegaly or masses felt. Normal bowel sounds heard. Central nervous system: Alert and oriented. No focal neurological deficits. Extremities: Symmetric 5 x 5 power. Skin: No rashes, lesions or ulcers Psychiatry: Judgement and insight appear normal. Mood & affect appropriate.

## 2023-07-22 NOTE — Evaluation (Signed)
 Occupational Therapy Evaluation Patient Details Name: Jody Blake MRN: [id number] DOB: May 06, 1925 Today's Date: 07/22/2023   History of Present Illness   Patient is a 89 year old female who presented with 5 day history of URI symptoms. Patient was admitted with severe sepsis, CAP, COPD exacerbation. PMH; a fib, GERD, peripheral neuropathy, DVT, legally blind, glaucoma, diastolic dysfunction.     Clinical Impressions Patient is a 89 year old female who was admitted for above. Patient was living at [facility] ILF at rollator level with 24/7 caregiver support at baseline. Currently, patient was min A for transfers with RW with O2 on 2L/min during session. Patient was noted to have decreased functional activity tolerance, decreased endurance, decreased standing balance, decreased safety awareness, and decreased knowledge of AD/AE impacting participation in ADLs. Patient will benefit from continued inpatient follow up therapy, <3 hours/day.       If plan is discharge home, recommend the following:   A lot of help with bathing/dressing/bathroom;Assistance with cooking/housework;Direct supervision/assist for medications management;Assist for transportation;Help with stairs or ramp for entrance;Direct supervision/assist for financial management     Functional Status Assessment   Patient has had a recent decline in their functional status and demonstrates the ability to make significant improvements in function in a reasonable and predictable amount of time.     Equipment Recommendations   None recommended by OT      Precautions/Restrictions   Precautions Precautions: Fall Precaution/Restrictions Comments: monitor O2 Restrictions Weight Bearing Restrictions Per Provider Order: No     Mobility Bed Mobility               General bed mobility comments: patient was up in recliner and returned to the same.             Balance Overall balance assessment: Mild  deficits observed, not formally tested         ADL either performed or assessed with clinical judgement   ADL Overall ADL's : Needs assistance/impaired Eating/Feeding: Set up;Sitting   Grooming: Sitting;Set up   Upper Body Bathing: Sitting;Set up   Lower Body Bathing: Sitting/lateral leans;Moderate assistance   Upper Body Dressing : Sitting;Minimal assistance   Lower Body Dressing: Sitting/lateral leans;Moderate assistance   Toilet Transfer: Minimal assistance;Ambulation;Rolling walker (2 wheels) Toilet Transfer Details (indicate cue type and reason): with increased time. able to maintain o2 on 2L/min 100% Toileting- Clothing Manipulation and Hygiene: Sitting/lateral lean;Contact guard assist         General ADL Comments: patient and caregiver educated on calling nursing to get to bathroom to build functional activity tolerance. patient and caregiver veralized understanding.     Vision Baseline Vision/History: 3 Glaucoma;6 Macular Degeneration;2 Legally blind Additional Comments: patient reported being legally blind, patient reported being able to see minimally            Pertinent Vitals/Pain Pain Assessment Pain Assessment: No/denies pain     Extremity/Trunk Assessment Upper Extremity Assessment Upper Extremity Assessment: Overall WFL for tasks assessed   Lower Extremity Assessment Lower Extremity Assessment: Defer to PT evaluation   Cervical / Trunk Assessment Cervical / Trunk Assessment: Kyphotic   Communication Communication Communication: Impaired Factors Affecting Communication: Hearing impaired   Cognition Arousal: Alert Behavior During Therapy: WFL for tasks assessed/performed Cognition: Difficult to assess Difficult to assess due to: Hard of hearing/deaf           OT - Cognition Comments: patient is HOH with caregiver and daughter present during session. patient was able to follow commands.  Following commands: Intact        Cueing  General Comments   Cueing Techniques: Verbal cues              Home Living Family/patient expects to be discharged to:: Assisted living         Home Equipment: Rollator (4 wheels)          Prior Functioning/Environment Prior Level of Function : Needs assist             Mobility Comments: uses rollator ADLs Comments: has supervision from caregiver 24/7 at home.    OT Problem List: Impaired balance (sitting and/or standing);Decreased knowledge of precautions;Cardiopulmonary status limiting activity;Decreased safety awareness;Decreased knowledge of use of DME or AE;Decreased activity tolerance   OT Treatment/Interventions: Self-care/ADL training;DME and/or AE instruction;Therapeutic activities;Balance training;Energy conservation;Patient/family education      OT Goals(Current goals can be found in the care plan section)   Acute Rehab OT Goals Patient Stated Goal: to get back to [facility] OT Goal Formulation: With patient/family Time For Goal Achievement: 08/05/23 Potential to Achieve Goals: Fair   OT Frequency:  Min 1X/week       AM-PAC OT "6 Clicks" Daily Activity     Outcome Measure Help from another person eating meals?: A Little Help from another person taking care of personal grooming?: A Little Help from another person toileting, which includes using toliet, bedpan, or urinal?: A Little   Help from another person to put on and taking off regular upper body clothing?: A Little Help from another person to put on and taking off regular lower body clothing?: A Lot 6 Click Score: 14   End of Session Equipment Utilized During Treatment: Gait belt;Rolling walker (2 wheels);Oxygen Nurse Communication: Mobility status  Activity Tolerance: Patient tolerated treatment well Patient left: in chair;with call bell/phone within reach;with family/visitor present  OT Visit Diagnosis: Unsteadiness on feet (R26.81);Other abnormalities of gait and  mobility (R26.89)                Time: 1610-9604 OT Time Calculation (min): 26 min Charges:  OT General Charges $OT Visit: 1 Visit OT Evaluation $OT Eval Low Complexity: 1 Low OT Treatments $Self Care/Home Management : 8-22 mins  Wynette Heckler, MS Acute Rehabilitation Department Office# [phone]   Jame Maze 07/22/2023, 1:02 PM

## 2023-07-22 NOTE — Evaluation (Signed)
 Physical Therapy Evaluation Patient Details Name: Jody Blake MRN: [id number] DOB: [birth year] Today's Date: 07/22/2023  History of Present Illness  89 year old female who presented with 5 day history of URI symptoms. Patient was admitted with severe sepsis, CAP, COPD exacerbation. PMH; a fib, GERD, peripheral neuropathy, DVT, legally blind, glaucoma, diastolic dysfunction.  Clinical Impression  On eval, pt was CGA-Min A for mobility. She walked ~120 feet with a rollator. O2: 97% on RA at rest, 94% on RA with ambulation. Pt tolerated activity well. Will plan to follow pt acutely. Encouraged spirometer breathing. Recommend HHPT f/u after discharge.         If plan is discharge home, recommend the following: A little help with walking and/or transfers;A little help with bathing/dressing/bathroom;Assistance with cooking/housework;Assist for transportation;Help with stairs or ramp for entrance   Can travel by private vehicle        Equipment Recommendations None recommended by PT  Recommendations for Other Services       Functional Status Assessment Patient has had a recent decline in their functional status and demonstrates the ability to make significant improvements in function in a reasonable and predictable amount of time.     Precautions / Restrictions Precautions Precautions: Fall Precaution/Restrictions Comments: monitor O2 Restrictions Weight Bearing Restrictions Per Provider Order: No      Mobility  Bed Mobility               General bed mobility comments: patient was up in recliner and returned to the same.    Transfers Overall transfer level: Needs assistance Equipment used: Rollator (4 wheels) Transfers: Sit to/from Stand Sit to Stand: Min assist           General transfer comment: Small amount of assist to rise, steady, control descent. Cues for safety, technique, hand placement. Increased time.    Ambulation/Gait Ambulation/Gait assistance:  Contact guard assist Gait Distance (Feet): 120 Feet Assistive device: Rollator (4 wheels) Gait Pattern/deviations: Step-through pattern, Decreased stride length       General Gait Details: Fair gait speed. O2 95% on RA, dyspnea 2/4. Tolerated distance well.  Stairs            Wheelchair Mobility     Tilt Bed    Modified Rankin (Stroke Patients Only)       Balance Overall balance assessment: Needs assistance         Standing balance support: During functional activity, Reliant on assistive device for balance, Bilateral upper extremity supported Standing balance-Leahy Scale: Fair                               Pertinent Vitals/Pain Pain Assessment Pain Assessment: No/denies pain    Home Living Family/patient expects to be discharged to:: Private residence Living Arrangements: Alone Available Help at Discharge: Personal care attendant;Available 24 hours/day Type of Home: Independent living facility Home Access: Level entry       Home Layout: One level Home Equipment: Rollator (4 wheels)      Prior Function Prior Level of Function : Needs assist             Mobility Comments: uses rollator for ambulation ADLs Comments: caregivers assist as needed     Extremity/Trunk Assessment   Upper Extremity Assessment Upper Extremity Assessment: Defer to OT evaluation    Lower Extremity Assessment Lower Extremity Assessment: Generalized weakness    Cervical / Trunk Assessment Cervical / Trunk Assessment: Kyphotic  Communication   Communication Communication: Impaired Factors Affecting Communication: Hearing impaired    Cognition Arousal: Alert Behavior During Therapy: WFL for tasks assessed/performed                             Following commands: Intact       Cueing Cueing Techniques: Verbal cues     General Comments      Exercises     Assessment/Plan    PT Assessment Patient needs continued PT services  PT  Problem List Decreased strength;Decreased range of motion;Decreased activity tolerance;Decreased balance;Decreased mobility;Decreased knowledge of use of DME       PT Treatment Interventions DME instruction;Gait training;Functional mobility training;Therapeutic activities;Patient/family education;Balance training    PT Goals (Current goals can be found in the Care Plan section)  Acute Rehab PT Goals Patient Stated Goal: to feel better PT Goal Formulation: With patient Time For Goal Achievement: 08/05/23 Potential to Achieve Goals: Good    Frequency Min 3X/week     Co-evaluation               AM-PAC PT "6 Clicks" Mobility  Outcome Measure Help needed turning from your back to your side while in a flat bed without using bedrails?: A Little Help needed moving from lying on your back to sitting on the side of a flat bed without using bedrails?: A Little Help needed moving to and from a bed to a chair (including a wheelchair)?: A Little Help needed standing up from a chair using your arms (e.g., wheelchair or bedside chair)?: A Little Help needed to walk in hospital room?: A Little Help needed climbing 3-5 steps with a railing? : A Lot 6 Click Score: 17    End of Session Equipment Utilized During Treatment: Gait belt Activity Tolerance: Patient tolerated treatment well Patient left: in chair;with call bell/phone within reach;with family/visitor present   PT Visit Diagnosis: Unsteadiness on feet (R26.81);Muscle weakness (generalized) (M62.81)    Time: 1610-9604 PT Time Calculation (min) (ACUTE ONLY): 14 min   Charges:   PT Evaluation $PT Eval Low Complexity: 1 Low   PT General Charges $$ ACUTE PT VISIT: 1 Visit            Tanda Falter, PT Acute Rehabilitation  Office: [phone]

## 2023-07-22 NOTE — Progress Notes (Signed)
 PROGRESS NOTE    Jody Blake  ZOX:[id number] DOB: 03-Sep-1925 DOA: 07/21/2023 PCP: Mahlon Gammon, MD    Brief Narrative:   89 year old with history of COPD on room air, diastolic dysfunction, DVT, paroxysmal A-fib on Xarelto, legally blind, glaucoma, hard of hearing admitted to the hospital for sepsis secondary to community-acquired pneumonia.  Patient started having URI type of symptoms about 5 days ago and PCP had placed her on prednisone 2 days prior to hospitalization.  Upon admission noted to have hypoxia therefore started on steroids and bronchodilators.  Assessment & Plan:  Principal Problem:   CAP (community acquired pneumonia) Active Problems:   Sepsis due to pneumonia (HCC)   Severe sepsis secondary to community-acquired pneumonia COPD exacerbation - Currently started on Rocephin and azithromycin - COVID, RSV, flu, = neg - Procalcitonin, BNP 428 - Continue steroids - Bronchodilators, I-S/flutter valve  Atrial fibrillation, chronic - Lopressor and Xarelto.  IV as needed  Congestive heart failure with preserved EF - Appears to be euvolemic.  Continue home medications. -Continue torsemide, losartan, metoprolol  GERD - Pepcid  Peripheral neuropathy - Bedtime gabapentin  Essential hypertension - Losartan.  IV as needed.  Hypothyroidism - Synthroid   DVT prophylaxis:  Rivaroxaban (XARELTO) tablet 15 mg      Code Status: Do not attempt resuscitation (DNR) PRE-ARREST INTERVENTIONS DESIRED Family Communication:   Status is: Inpatient Remains inpatient appropriate because: Continue hospital stay for treatment of sepsis secondary to CAP Expect atleast 48 hrs of hosp stay   Subjective: Caregiver at bedside.  Ptn feeling better but no where close to her baseline.    Examination:  General exam: Appears calm and comfortable  Respiratory system: diffuse rhonchi Cardiovascular system: S1 & S2 heard, RRR. No JVD, murmurs, rubs, gallops or clicks. No pedal  edema. Gastrointestinal system: Abdomen is nondistended, soft and nontender. No organomegaly or masses felt. Normal bowel sounds heard. Central nervous system: Alert and oriented. No focal neurological deficits. Extremities: Symmetric 5 x 5 power. Skin: No rashes, lesions or ulcers Psychiatry: Judgement and insight appear normal. Mood & affect appropriate.                Diet Orders (From admission, onward)     Start     Ordered   07/21/23 1312  Diet Heart Room service appropriate? Yes; Fluid consistency: Thin  Diet effective now       Question Answer Comment  Room service appropriate? Yes   Fluid consistency: Thin      07/21/23 1312            Objective: Vitals:   07/21/23 1840 07/21/23 2219 07/22/23 0331 07/22/23 0758  BP: (!) 178/74 (!) 156/82 (!) 176/67   Pulse: 60 61 60   Resp: 16 17 18    Temp: (!) 97.4 F (36.3 C) 98.6 F (37 C) 98.6 F (37 C)   TempSrc: Oral Oral Oral   SpO2: 98% 98% 96% 96%  Weight:      Height:        Intake/Output Summary (Last 24 hours) at 07/22/2023 1134 Last data filed at 07/22/2023 0346 Gross per 24 hour  Intake 350.41 ml  Output 500 ml  Net -149.59 ml   Filed Weights   07/21/23 1400  Weight: 66.1 kg    Scheduled Meds:  arformoterol  15 mcg Nebulization BID   [START ON 07/23/2023] azithromycin  500 mg Oral Daily   home med stored in pyxis  3 each Oral QHS   dorzolamide  1 drop Both Eyes BID   famotidine  40 mg Oral QHS   gabapentin  200 mg Oral QHS   guaiFENesin  600 mg Oral BID   latanoprost  1 drop Both Eyes QHS   levothyroxine  75 mcg Oral Q0600   [START ON 07/23/2023] losartan  50 mg Oral Daily   magnesium gluconate  750 mg Oral QPM   methylPREDNISolone (SOLU-MEDROL) injection  40 mg Intravenous Q12H   metoprolol tartrate  75 mg Oral Daily   montelukast  10 mg Oral QHS   revefenacin  175 mcg Nebulization Daily   rivaroxaban  15 mg Oral Q supper   torsemide  30 mg Oral Daily   Continuous Infusions:   cefTRIAXone (ROCEPHIN)  IV 1 g (07/22/23 1043)    Nutritional status     Body mass index is 25.83 kg/m.  Data Reviewed:   CBC: Recent Labs  Lab 07/21/23 0924 07/22/23 0419  WBC 15.6* 12.5*  HGB 11.6* 9.6*  HCT 38.9 32.6*  MCV 101.6* 100.9*  PLT 405* 305   Basic Metabolic Panel: Recent Labs  Lab 07/21/23 0924 07/22/23 0419  NA 145 142  K 3.5 3.8  CL 105 108  CO2 26 24  GLUCOSE 144* 204*  BUN 35* 29*  CREATININE 1.34* 1.11*  CALCIUM 10.2 9.4   GFR: Estimated Creatinine Clearance: 27.7 mL/min (A) (by C-G formula based on SCr of 1.11 mg/dL (H)). Liver Function Tests: Recent Labs  Lab 07/21/23 0924  AST 20  ALT 13  ALKPHOS 64  BILITOT 0.8  PROT 7.4  ALBUMIN 3.5   No results for input(s): "LIPASE", "AMYLASE" in the last 168 hours. No results for input(s): "AMMONIA" in the last 168 hours. Coagulation Profile: No results for input(s): "INR", "PROTIME" in the last 168 hours. Cardiac Enzymes: No results for input(s): "CKTOTAL", "CKMB", "CKMBINDEX", "TROPONINI" in the last 168 hours. BNP (last 3 results) No results for input(s): "PROBNP" in the last 8760 hours. HbA1C: No results for input(s): "HGBA1C" in the last 72 hours. CBG: No results for input(s): "GLUCAP" in the last 168 hours. Lipid Profile: No results for input(s): "CHOL", "HDL", "LDLCALC", "TRIG", "CHOLHDL", "LDLDIRECT" in the last 72 hours. Thyroid Function Tests: No results for input(s): "TSH", "T4TOTAL", "FREET4", "T3FREE", "THYROIDAB" in the last 72 hours. Anemia Panel: No results for input(s): "VITAMINB12", "FOLATE", "FERRITIN", "TIBC", "IRON", "RETICCTPCT" in the last 72 hours. Sepsis Labs: Recent Labs  Lab 07/21/23 1610 07/21/23 1154 07/21/23 1434 07/21/23 1615 07/22/23 0419  PROCALCITON  --   --   --   --  0.31  LATICACIDVEN 2.3* 2.6* 2.1* 1.8  --     Recent Results (from the past 240 hours)  Resp panel by RT-PCR (RSV, Flu A&B, Covid) Anterior Nasal Swab     Status: None    Collection Time: 07/21/23  9:24 AM   Specimen: Anterior Nasal Swab  Result Value Ref Range Status   SARS Coronavirus 2 by RT PCR NEGATIVE NEGATIVE Final    Comment: (NOTE) SARS-CoV-2 target nucleic acids are NOT DETECTED.  The SARS-CoV-2 RNA is generally detectable in upper respiratory specimens during the acute phase of infection. The lowest concentration of SARS-CoV-2 viral copies this assay can detect is 138 copies/mL. A negative result does not preclude SARS-Cov-2 infection and should not be used as the sole basis for treatment or other patient management decisions. A negative result may occur with  improper specimen collection/handling, submission of specimen other than nasopharyngeal swab, presence of viral mutation(s) within the  areas targeted by this assay, and inadequate number of viral copies(<138 copies/mL). A negative result must be combined with clinical observations, patient history, and epidemiological information. The expected result is Negative.  Fact Sheet for Patients:  BloggerCourse.com  Fact Sheet for Healthcare Providers:  SeriousBroker.it  This test is no t yet approved or cleared by the [place]  FDA and  has been authorized for detection and/or diagnosis of SARS-CoV-2 by FDA under an Emergency Use Authorization (EUA). This EUA will remain  in effect (meaning this test can be used) for the duration of the COVID-19 declaration under Section 564(b)(1) of the Act, 21 U.S.C.section 360bbb-3(b)(1), unless the authorization is terminated  or revoked sooner.       Influenza A by PCR NEGATIVE NEGATIVE Final   Influenza B by PCR NEGATIVE NEGATIVE Final    Comment: (NOTE) The Xpert Xpress SARS-CoV-2/FLU/RSV plus assay is intended as an aid in the diagnosis of influenza from Nasopharyngeal swab specimens and should not be used as a sole basis for treatment. Nasal washings and aspirates are unacceptable for  Xpert Xpress SARS-CoV-2/FLU/RSV testing.  Fact Sheet for Patients: BloggerCourse.com  Fact Sheet for Healthcare Providers: SeriousBroker.it  This test is not yet approved or cleared by the [place]  FDA and has been authorized for detection and/or diagnosis of SARS-CoV-2 by FDA under an Emergency Use Authorization (EUA). This EUA will remain in effect (meaning this test can be used) for the duration of the COVID-19 declaration under Section 564(b)(1) of the Act, 21 U.S.C. section 360bbb-3(b)(1), unless the authorization is terminated or revoked.     Resp Syncytial Virus by PCR NEGATIVE NEGATIVE Final    Comment: (NOTE) Fact Sheet for Patients: BloggerCourse.com  Fact Sheet for Healthcare Providers: SeriousBroker.it  This test is not yet approved or cleared by the [place]  FDA and has been authorized for detection and/or diagnosis of SARS-CoV-2 by FDA under an Emergency Use Authorization (EUA). This EUA will remain in effect (meaning this test can be used) for the duration of the COVID-19 declaration under Section 564(b)(1) of the Act, 21 U.S.C. section 360bbb-3(b)(1), unless the authorization is terminated or revoked.  Performed at [facility], [address] [place], [place] [address]   Blood culture (routine x 2)     Status: None (Preliminary result)   Collection Time: 07/21/23  9:24 AM   Specimen: BLOOD  Result Value Ref Range Status   Specimen Description   Final    BLOOD RIGHT ANTECUBITAL Performed at [facility], [address] [place], [place] [address]    Special Requests   Final    BOTTLES DRAWN AEROBIC AND ANAEROBIC Blood Culture adequate volume Performed at [facility], [address], [place], [place] [address]    Culture   Final    NO GROWTH < 24 HOURS Performed at [facility], [address], [place], [place] [address]    Report Status PENDING  Incomplete  Blood culture (routine x 2)     Status: None (Preliminary result)   Collection Time: 07/21/23  9:56 AM   Specimen: BLOOD  Result Value Ref Range Status   Specimen Description   Final    BLOOD LEFT ANTECUBITAL Performed at [facility], [address] [place], [place] [address]    Special Requests   Final    BOTTLES DRAWN AEROBIC AND ANAEROBIC Blood Culture results may not be optimal due to an inadequate volume of blood received in culture  bottles Performed at [facility], [address], [place], [place] [address]    Culture   Final    NO GROWTH < 24 HOURS Performed at [facility], [address], [place], [place] [address]    Report Status PENDING  Incomplete         Radiology Studies: DG Chest Port 1 View Result Date: 07/21/2023 CLINICAL DATA:  Shortness of breath with wheezing and productive cough. EXAM: PORTABLE CHEST 1 VIEW COMPARISON:  Radiographs 06/24/2022 and 04/08/2022.  CT 07/07/2022. FINDINGS: 0805 hours. Left subclavian pacemaker leads appear unchanged, projecting over the right atrium and right ventricle. Stable mild cardiac enlargement and aortic atherosclerosis. There are lower lung volumes with new focal perihilar airspace opacities bilaterally. There is chronic blunting of the left costophrenic angle with a possible small superimposed left pleural effusion. No evidence of pneumothorax. No acute osseous findings are demonstrated. Stable asymmetric glenohumeral degenerative changes on the right and a thoracolumbar scoliosis. IMPRESSION: New focal perihilar airspace opacities bilaterally, suspicious for pneumonia. Possible small left pleural effusion. Followup PA and lateral chest X-ray is recommended in 4-6 weeks following appropriate therapy to ensure resolution and exclude underlying malignancy. Electronically Signed   By: Elmon Hagedorn M.D.    On: 07/21/2023 08:21           LOS: 1 day   Time spent= 35 mins    Maggie Schooner, MD [facility]  If 7PM-7AM, please contact night-coverage  07/22/2023, 11:34 AM

## 2023-07-23 DIAGNOSIS — J189 Pneumonia, unspecified organism: Secondary | ICD-10-CM | POA: Diagnosis not present

## 2023-07-23 LAB — CBC
HCT: 36 % (ref 36.0–46.0)
Hemoglobin: 10.5 g/dL — ABNORMAL LOW (ref 12.0–15.0)
MCH: 29.9 pg (ref 26.0–34.0)
MCHC: 29.2 g/dL — ABNORMAL LOW (ref 30.0–36.0)
MCV: 102.6 fL — ABNORMAL HIGH (ref 80.0–100.0)
Platelets: 345 10*3/uL (ref 150–400)
RBC: 3.51 MIL/uL — ABNORMAL LOW (ref 3.87–5.11)
RDW: 15 % (ref 11.5–15.5)
WBC: 14 10*3/uL — ABNORMAL HIGH (ref 4.0–10.5)
nRBC: 0.4 % — ABNORMAL HIGH (ref 0.0–0.2)

## 2023-07-23 LAB — MAGNESIUM: Magnesium: 2.5 mg/dL — ABNORMAL HIGH (ref 1.7–2.4)

## 2023-07-23 LAB — BASIC METABOLIC PANEL WITH GFR
Anion gap: 11 (ref 5–15)
BUN: 36 mg/dL — ABNORMAL HIGH (ref 8–23)
CO2: 26 mmol/L (ref 22–32)
Calcium: 9.4 mg/dL (ref 8.9–10.3)
Chloride: 104 mmol/L (ref 98–111)
Creatinine, Ser: 1.08 mg/dL — ABNORMAL HIGH (ref 0.44–1.00)
GFR, Estimated: 47 mL/min — ABNORMAL LOW (ref >60)
Glucose, Bld: 179 mg/dL — ABNORMAL HIGH (ref 70–99)
Potassium: 3.4 mmol/L — ABNORMAL LOW (ref 3.5–5.1)
Sodium: 141 mmol/L (ref 135–145)

## 2023-07-23 LAB — PHOSPHORUS: Phosphorus: 2.1 mg/dL — ABNORMAL LOW (ref 2.5–4.6)

## 2023-07-23 MED ORDER — GUAIFENESIN ER 600 MG PO TB12
1200.0000 mg | ORAL_TABLET | Freq: Two times a day (BID) | ORAL | Status: DC
  Administered 2023-07-23 – 2023-07-27 (×9): 1200 mg via ORAL
  Filled 2023-07-23 (×9): qty 2

## 2023-07-23 MED ORDER — AMLODIPINE BESYLATE 10 MG PO TABS
5.0000 mg | ORAL_TABLET | Freq: Every day | ORAL | Status: DC
  Administered 2023-07-23 – 2023-07-27 (×5): 5 mg via ORAL
  Filled 2023-07-23 (×5): qty 1

## 2023-07-23 MED ORDER — FAMOTIDINE 20 MG PO TABS
20.0000 mg | ORAL_TABLET | Freq: Every day | ORAL | Status: DC
  Administered 2023-07-23 – 2023-07-26 (×4): 20 mg via ORAL
  Filled 2023-07-23 (×4): qty 1

## 2023-07-23 MED ORDER — POTASSIUM PHOSPHATES 15 MMOLE/5ML IV SOLN
30.0000 mmol | Freq: Once | INTRAVENOUS | Status: AC
  Administered 2023-07-23: 30 mmol via INTRAVENOUS
  Filled 2023-07-23: qty 10

## 2023-07-23 NOTE — Progress Notes (Signed)
 PT Cancellation Note  Patient Details Name: Jody Blake MRN: [id number] DOB: 1925-11-07   Cancelled Treatment:    Reason Eval/Treat Not Completed:  Attempted PT tx session-caregiver declined participation for pt this attempt-did not want me to awaken pt. Will check back another day.    Tanda Falter, PT Acute Rehabilitation  Office: [phone]

## 2023-07-23 NOTE — Progress Notes (Signed)
 PROGRESS NOTE    Jody Blake  ZOX:[id number] DOB: 07/05/1925 DOA: 07/21/2023 PCP: Mahlon Gammon, MD    Brief Narrative:   89 year old with history of COPD on room air, diastolic dysfunction, DVT, paroxysmal A-fib on Xarelto, legally blind, glaucoma, hard of hearing admitted to the hospital for sepsis secondary to community-acquired pneumonia.  Patient started having URI type of symptoms about 5 days ago and PCP had placed her on prednisone 2 days prior to hospitalization.  Upon admission noted to have hypoxia therefore started on steroids and bronchodilators.  Assessment & Plan:  Principal Problem:   CAP (community acquired pneumonia) Active Problems:   Sepsis due to pneumonia (HCC)   Severe sepsis secondary to community-acquired pneumonia COPD exacerbation - Currently started on Rocephin and azithromycin - COVID, RSV, flu, = neg - Procalcitonin - 0.31, BNP 428 - Continue steroids - Bronchodilators, I-S/flutter valve  Atrial fibrillation, chronic - Lopressor and Xarelto.  IV as needed  Congestive heart failure with preserved EF - Appears to be euvolemic.  Continue home medications. -Continue torsemide, losartan, metoprolol  GERD - Pepcid  Peripheral neuropathy - Bedtime gabapentin  Essential hypertension - Losartan.  IV as needed.  Hypothyroidism - Synthroid  Hypokalemia/hypophosphatemia - Replacement   DVT prophylaxis:  Rivaroxaban (XARELTO) tablet 15 mg      Code Status: Do not attempt resuscitation (DNR) PRE-ARREST INTERVENTIONS DESIRED Family Communication:   Status is: Inpatient Remains inpatient appropriate because: Continue hospital stay for treatment of sepsis secondary to CAP Expect atleast 48 hrs of hosp stay   Subjective: Sitting in her bed.  Tells me she still has significant cough and dyspnea with minimal exertion.  Overall she feels weak today compared to yesterday.  Examination:  General exam: Appears calm and comfortable   Respiratory system: diffuse rhonchi Cardiovascular system: S1 & S2 heard, RRR. No JVD, murmurs, rubs, gallops or clicks. No pedal edema. Gastrointestinal system: Abdomen is nondistended, soft and nontender. No organomegaly or masses felt. Normal bowel sounds heard. Central nervous system: Alert and oriented. No focal neurological deficits. Extremities: Symmetric 5 x 5 power. Skin: No rashes, lesions or ulcers Psychiatry: Judgement and insight appear normal. Mood & affect appropriate.                Diet Orders (From admission, onward)     Start     Ordered   07/21/23 1312  Diet Heart Room service appropriate? Yes; Fluid consistency: Thin  Diet effective now       Question Answer Comment  Room service appropriate? Yes   Fluid consistency: Thin      07/21/23 1312            Objective: Vitals:   07/23/23 0420 07/23/23 0628 07/23/23 0837 07/23/23 0919  BP: (!) 200/81 (!) 156/57 (!) 174/65   Pulse: 60 61 62   Resp: 20 18 20    Temp: 98.8 F (37.1 C) 98.7 F (37.1 C) 98.7 F (37.1 C)   TempSrc: Oral Oral Oral   SpO2: 96% 94% 96% 96%  Weight:      Height:        Intake/Output Summary (Last 24 hours) at 07/23/2023 1151 Last data filed at 07/22/2023 1300 Gross per 24 hour  Intake 220 ml  Output --  Net 220 ml   Filed Weights   07/21/23 1400  Weight: 66.1 kg    Scheduled Meds:  amLODipine  5 mg Oral Daily   arformoterol  15 mcg Nebulization BID   azithromycin  500  mg Oral Daily   home med stored in pyxis  3 each Oral QHS   dorzolamide  1 drop Both Eyes BID   famotidine  40 mg Oral QHS   gabapentin  200 mg Oral QHS   guaiFENesin  1,200 mg Oral BID   latanoprost  1 drop Both Eyes QHS   levothyroxine  75 mcg Oral Q0600   losartan  50 mg Oral Daily   magnesium gluconate  750 mg Oral QPM   methylPREDNISolone (SOLU-MEDROL) injection  40 mg Intravenous Q12H   metoprolol tartrate  75 mg Oral Daily   montelukast  10 mg Oral QHS   revefenacin  175 mcg  Nebulization Daily   rivaroxaban  15 mg Oral Q supper   torsemide  30 mg Oral Daily   Continuous Infusions:  cefTRIAXone (ROCEPHIN)  IV 1 g (07/22/23 1043)   potassium PHOSPHATE IVPB (in mmol)      Nutritional status     Body mass index is 25.83 kg/m.  Data Reviewed:   CBC: Recent Labs  Lab 07/21/23 0924 07/22/23 0419 07/23/23 0411  WBC 15.6* 12.5* 14.0*  HGB 11.6* 9.6* 10.5*  HCT 38.9 32.6* 36.0  MCV 101.6* 100.9* 102.6*  PLT 405* 305 345   Basic Metabolic Panel: Recent Labs  Lab 07/21/23 0924 07/22/23 0419 07/23/23 0411  NA 145 142 141  K 3.5 3.8 3.4*  CL 105 108 104  CO2 26 24 26   GLUCOSE 144* 204* 179*  BUN 35* 29* 36*  CREATININE 1.34* 1.11* 1.08*  CALCIUM 10.2 9.4 9.4  MG  --   --  2.5*  PHOS  --   --  2.1*   GFR: Estimated Creatinine Clearance: 28.5 mL/min (A) (by C-G formula based on SCr of 1.08 mg/dL (H)). Liver Function Tests: Recent Labs  Lab 07/21/23 0924  AST 20  ALT 13  ALKPHOS 64  BILITOT 0.8  PROT 7.4  ALBUMIN 3.5   No results for input(s): "LIPASE", "AMYLASE" in the last 168 hours. No results for input(s): "AMMONIA" in the last 168 hours. Coagulation Profile: No results for input(s): "INR", "PROTIME" in the last 168 hours. Cardiac Enzymes: No results for input(s): "CKTOTAL", "CKMB", "CKMBINDEX", "TROPONINI" in the last 168 hours. BNP (last 3 results) No results for input(s): "PROBNP" in the last 8760 hours. HbA1C: No results for input(s): "HGBA1C" in the last 72 hours. CBG: No results for input(s): "GLUCAP" in the last 168 hours. Lipid Profile: No results for input(s): "CHOL", "HDL", "LDLCALC", "TRIG", "CHOLHDL", "LDLDIRECT" in the last 72 hours. Thyroid Function Tests: No results for input(s): "TSH", "T4TOTAL", "FREET4", "T3FREE", "THYROIDAB" in the last 72 hours. Anemia Panel: No results for input(s): "VITAMINB12", "FOLATE", "FERRITIN", "TIBC", "IRON", "RETICCTPCT" in the last 72 hours. Sepsis Labs: Recent Labs  Lab  07/21/23 1610 07/21/23 1154 07/21/23 1434 07/21/23 1615 07/22/23 0419  PROCALCITON  --   --   --   --  0.31  LATICACIDVEN 2.3* 2.6* 2.1* 1.8  --     Recent Results (from the past 240 hours)  Resp panel by RT-PCR (RSV, Flu A&B, Covid) Anterior Nasal Swab     Status: None   Collection Time: 07/21/23  9:24 AM   Specimen: Anterior Nasal Swab  Result Value Ref Range Status   SARS Coronavirus 2 by RT PCR NEGATIVE NEGATIVE Final    Comment: (NOTE) SARS-CoV-2 target nucleic acids are NOT DETECTED.  The SARS-CoV-2 RNA is generally detectable in upper respiratory specimens during the acute phase of infection. The  lowest concentration of SARS-CoV-2 viral copies this assay can detect is 138 copies/mL. A negative result does not preclude SARS-Cov-2 infection and should not be used as the sole basis for treatment or other patient management decisions. A negative result may occur with  improper specimen collection/handling, submission of specimen other than nasopharyngeal swab, presence of viral mutation(s) within the areas targeted by this assay, and inadequate number of viral copies(<138 copies/mL). A negative result must be combined with clinical observations, patient history, and epidemiological information. The expected result is Negative.  Fact Sheet for Patients:  BloggerCourse.com  Fact Sheet for Healthcare Providers:  SeriousBroker.it  This test is no t yet approved or cleared by the [place] FDA and  has been authorized for detection and/or diagnosis of SARS-CoV-2 by FDA under an Emergency Use Authorization (EUA). This EUA will remain  in effect (meaning this test can be used) for the duration of the COVID-19 declaration under Section 564(b)(1) of the Act, 21 U.S.C.section 360bbb-3(b)(1), unless the authorization is terminated  or revoked sooner.       Influenza A by PCR NEGATIVE NEGATIVE Final   Influenza B by PCR  NEGATIVE NEGATIVE Final    Comment: (NOTE) The Xpert Xpress SARS-CoV-2/FLU/RSV plus assay is intended as an aid in the diagnosis of influenza from Nasopharyngeal swab specimens and should not be used as a sole basis for treatment. Nasal washings and aspirates are unacceptable for Xpert Xpress SARS-CoV-2/FLU/RSV testing.  Fact Sheet for Patients: BloggerCourse.com  Fact Sheet for Healthcare Providers: SeriousBroker.it  This test is not yet approved or cleared by the [place] FDA and has been authorized for detection and/or diagnosis of SARS-CoV-2 by FDA under an Emergency Use Authorization (EUA). This EUA will remain in effect (meaning this test can be used) for the duration of the COVID-19 declaration under Section 564(b)(1) of the Act, 21 U.S.C. section 360bbb-3(b)(1), unless the authorization is terminated or revoked.     Resp Syncytial Virus by PCR NEGATIVE NEGATIVE Final    Comment: (NOTE) Fact Sheet for Patients: BloggerCourse.com  Fact Sheet for Healthcare Providers: SeriousBroker.it  This test is not yet approved or cleared by the [place] FDA and has been authorized for detection and/or diagnosis of SARS-CoV-2 by FDA under an Emergency Use Authorization (EUA). This EUA will remain in effect (meaning this test can be used) for the duration of the COVID-19 declaration under Section 564(b)(1) of the Act, 21 U.S.C. section 360bbb-3(b)(1), unless the authorization is terminated or revoked.  Performed at [facility], [address], [place], [place] [address]   Blood culture (routine x 2)     Status: None (Preliminary result)   Collection Time: 07/21/23  9:24 AM   Specimen: BLOOD  Result Value Ref Range Status   Specimen Description   Final    BLOOD RIGHT ANTECUBITAL Performed at [facility], [address],  [place], [place] [address]    Special Requests   Final    BOTTLES DRAWN AEROBIC AND ANAEROBIC Blood Culture adequate volume Performed at [facility], [address], [place], [place] [address]    Culture   Final    NO GROWTH 2 DAYS Performed at [facility], [address], [place], [place] [address]    Report Status PENDING  Incomplete  Blood culture (routine x 2)     Status: None (Preliminary result)   Collection Time: 07/21/23  9:56 AM   Specimen: BLOOD  Result Value Ref Range Status  Specimen Description   Final    BLOOD LEFT ANTECUBITAL Performed at [facility], [address] [place], [place] [address]    Special Requests   Final    BOTTLES DRAWN AEROBIC AND ANAEROBIC Blood Culture results may not be optimal due to an inadequate volume of blood received in culture bottles Performed at [facility], [address] [place], [place] [address]    Culture   Final    NO GROWTH 2 DAYS Performed at [facility], [address], [place], [place] [address]    Report Status PENDING  Incomplete         Radiology Studies: No results found.         LOS: 2 days   Time spent= 35 mins    Maggie Schooner, MD [facility]  If 7PM-7AM, please contact night-coverage  07/23/2023, 11:51 AM

## 2023-07-23 NOTE — Plan of Care (Signed)
  Problem: Clinical Measurements: Goal: Ability to maintain clinical measurements within normal limits will improve Outcome: Progressing Goal: Will remain free from infection Outcome: Progressing Goal: Diagnostic test results will improve Outcome: Progressing Goal: Respiratory complications will improve Outcome: Progressing Goal: Cardiovascular complication will be avoided Outcome: Progressing   Problem: Activity: Goal: Risk for activity intolerance will decrease Outcome: Progressing   Problem: Elimination: Goal: Will not experience complications related to bowel motility Outcome: Progressing Goal: Will not experience complications related to urinary retention Outcome: Progressing   Problem: Pain Managment: Goal: General experience of comfort will improve and/or be controlled Outcome: Progressing   Problem: Safety: Goal: Ability to remain free from injury will improve Outcome: Progressing   Problem: Skin Integrity: Goal: Risk for impaired skin integrity will decrease Outcome: Progressing

## 2023-07-23 NOTE — Progress Notes (Signed)
 PT Cancellation Note  Patient Details Name: Jody Blake MRN: [id number] DOB: Nov 28, 1925   Cancelled Treatment:    Reason Eval/Treat Not Completed:  Attempted PT tx session-pt politely declined working with therapy at this time- "I'd rather wait. I don't think I can. " Will check back as schedule allows.    Tanda Falter, PT Acute Rehabilitation  Office: [phone]

## 2023-07-24 DIAGNOSIS — J189 Pneumonia, unspecified organism: Secondary | ICD-10-CM | POA: Diagnosis not present

## 2023-07-24 LAB — BASIC METABOLIC PANEL WITH GFR
Anion gap: 10 (ref 5–15)
BUN: 38 mg/dL — ABNORMAL HIGH (ref 8–23)
CO2: 28 mmol/L (ref 22–32)
Calcium: 8.8 mg/dL — ABNORMAL LOW (ref 8.9–10.3)
Chloride: 105 mmol/L (ref 98–111)
Creatinine, Ser: 1.05 mg/dL — ABNORMAL HIGH (ref 0.44–1.00)
GFR, Estimated: 49 mL/min — ABNORMAL LOW (ref >60)
Glucose, Bld: 172 mg/dL — ABNORMAL HIGH (ref 70–99)
Potassium: 3.6 mmol/L (ref 3.5–5.1)
Sodium: 143 mmol/L (ref 135–145)

## 2023-07-24 LAB — BRAIN NATRIURETIC PEPTIDE: B Natriuretic Peptide: 718.8 pg/mL — ABNORMAL HIGH (ref 0.0–100.0)

## 2023-07-24 LAB — CBC
HCT: 33.7 % — ABNORMAL LOW (ref 36.0–46.0)
Hemoglobin: 10.2 g/dL — ABNORMAL LOW (ref 12.0–15.0)
MCH: 30 pg (ref 26.0–34.0)
MCHC: 30.3 g/dL (ref 30.0–36.0)
MCV: 99.1 fL (ref 80.0–100.0)
Platelets: 338 10*3/uL (ref 150–400)
RBC: 3.4 MIL/uL — ABNORMAL LOW (ref 3.87–5.11)
RDW: 15 % (ref 11.5–15.5)
WBC: 11.5 10*3/uL — ABNORMAL HIGH (ref 4.0–10.5)
nRBC: 0.2 % (ref 0.0–0.2)

## 2023-07-24 LAB — MAGNESIUM: Magnesium: 2.3 mg/dL (ref 1.7–2.4)

## 2023-07-24 MED ORDER — BISACODYL 10 MG RE SUPP
10.0000 mg | Freq: Every day | RECTAL | Status: DC | PRN
Start: 2023-07-24 — End: 2023-07-27
  Administered 2023-07-24: 10 mg via RECTAL
  Filled 2023-07-24: qty 1

## 2023-07-24 MED ORDER — TORSEMIDE 10 MG PO TABS
30.0000 mg | ORAL_TABLET | Freq: Every day | ORAL | Status: DC
  Filled 2023-07-24: qty 3

## 2023-07-24 MED ORDER — FUROSEMIDE 10 MG/ML IJ SOLN
40.0000 mg | Freq: Once | INTRAMUSCULAR | Status: AC
  Administered 2023-07-24: 40 mg via INTRAVENOUS
  Filled 2023-07-24: qty 4

## 2023-07-24 MED ORDER — POTASSIUM CHLORIDE CRYS ER 20 MEQ PO TBCR
40.0000 meq | EXTENDED_RELEASE_TABLET | Freq: Once | ORAL | Status: AC
Start: 2023-07-24 — End: 2023-07-24
  Administered 2023-07-24: 40 meq via ORAL
  Filled 2023-07-24: qty 2

## 2023-07-24 NOTE — Progress Notes (Signed)
 PROGRESS NOTE    Jody Blake  WUJ:[id number] DOB: [birth year] DOA: 07/21/2023 PCP: Mahlon Gammon, MD    Brief Narrative:   89 year old with history of COPD on room air, diastolic dysfunction, DVT, paroxysmal A-fib on Xarelto, legally blind, glaucoma, hard of hearing admitted to the hospital for sepsis secondary to community-acquired pneumonia.  Patient started having URI type of symptoms about 5 days ago and PCP had placed her on prednisone 2 days prior to hospitalization.  Upon admission noted to have hypoxia therefore started on steroids and bronchodilators.  Assessment & Plan:  Principal Problem:   CAP (community acquired pneumonia) Active Problems:   Sepsis due to pneumonia (HCC)   Severe sepsis secondary to community-acquired pneumonia COPD exacerbation - Currently started on Rocephin and azithromycin - COVID, RSV, flu, = neg - Procalcitonin - 0.31, BNP 428 - Continue steroids - Bronchodilators, I-S/flutter valve   Acute on congestive heart failure exacerbation with preserved EF - May be slightly volume overloaded today.  Will give 1 dose of IV Lasix, reschedule torsemide starting tomorrow. - Continue losartan and metoprolol  Atrial fibrillation, chronic - Lopressor and Xarelto.  IV as needed   GERD - Pepcid  Peripheral neuropathy - Bedtime gabapentin  Essential hypertension - Losartan.  IV as needed.  Hypothyroidism - Synthroid  Hypokalemia/hypophosphatemia - Replacement   DVT prophylaxis:  Rivaroxaban (XARELTO) tablet 15 mg      Code Status: Do not attempt resuscitation (DNR) PRE-ARREST INTERVENTIONS DESIRED Family Communication: Caregiver at bedside Status is: Inpatient Remains inpatient appropriate because: Continue hospital stay for treatment of sepsis secondary to CAP Expect atleast 48 hrs of hosp stay   Subjective: Sitting up in the recliner.  Tells me her shortness of breath is slightly better compared to yesterday.  Continues to  frequently use incentive spirometer throughout the day.  Examination:  General exam: Appears calm and comfortable  Respiratory system: diffuse rhonchi Cardiovascular system: S1 & S2 heard, RRR. No JVD, murmurs, rubs, gallops or clicks. No pedal edema. Gastrointestinal system: Abdomen is nondistended, soft and nontender. No organomegaly or masses felt. Normal bowel sounds heard. Central nervous system: Alert and oriented. No focal neurological deficits. Extremities: Symmetric 5 x 5 power. Skin: No rashes, lesions or ulcers Psychiatry: Judgement and insight appear normal. Mood & affect appropriate.                Diet Orders (From admission, onward)     Start     Ordered   07/21/23 1312  Diet Heart Room service appropriate? Yes; Fluid consistency: Thin  Diet effective now       Question Answer Comment  Room service appropriate? Yes   Fluid consistency: Thin      07/21/23 1312            Objective: Vitals:   07/23/23 2030 07/24/23 0020 07/24/23 0627 07/24/23 0910  BP:  (!) 182/81 (!) 180/78   Pulse: 64 61 (!) 59   Resp: 18 18 18    Temp:  98.3 F (36.8 C) 97.7 F (36.5 C)   TempSrc:      SpO2: 97% 97% 96% 98%  Weight:      Height:        Intake/Output Summary (Last 24 hours) at 07/24/2023 1158 Last data filed at 07/24/2023 1030 Gross per 24 hour  Intake 840 ml  Output 900 ml  Net -60 ml   Filed Weights   07/21/23 1400  Weight: 66.1 kg    Scheduled Meds:  amLODipine  5 mg Oral Daily   arformoterol  15 mcg Nebulization BID   azithromycin  500 mg Oral Daily   home med stored in pyxis  3 each Oral QHS   dorzolamide  1 drop Both Eyes BID   famotidine  20 mg Oral QHS   furosemide  40 mg Intravenous Once   gabapentin  200 mg Oral QHS   guaiFENesin  1,200 mg Oral BID   latanoprost  1 drop Both Eyes QHS   levothyroxine  75 mcg Oral Q0600   losartan  50 mg Oral Daily   magnesium gluconate  750 mg Oral QPM   methylPREDNISolone (SOLU-MEDROL) injection   40 mg Intravenous Q12H   metoprolol tartrate  75 mg Oral Daily   montelukast  10 mg Oral QHS   potassium chloride  40 mEq Oral Once   revefenacin  175 mcg Nebulization Daily   rivaroxaban  15 mg Oral Q supper   [START ON 07/25/2023] torsemide  30 mg Oral Daily   Continuous Infusions:  cefTRIAXone (ROCEPHIN)  IV 1 g (07/23/23 1211)    Nutritional status     Body mass index is 25.83 kg/m.  Data Reviewed:   CBC: Recent Labs  Lab 07/21/23 0924 07/22/23 0419 07/23/23 0411 07/24/23 0435  WBC 15.6* 12.5* 14.0* 11.5*  HGB 11.6* 9.6* 10.5* 10.2*  HCT 38.9 32.6* 36.0 33.7*  MCV 101.6* 100.9* 102.6* 99.1  PLT 405* 305 345 338   Basic Metabolic Panel: Recent Labs  Lab 07/21/23 0924 07/22/23 0419 07/23/23 0411 07/24/23 0435  NA 145 142 141 143  K 3.5 3.8 3.4* 3.6  CL 105 108 104 105  CO2 26 24 26 28   GLUCOSE 144* 204* 179* 172*  BUN 35* 29* 36* 38*  CREATININE 1.34* 1.11* 1.08* 1.05*  CALCIUM 10.2 9.4 9.4 8.8*  MG  --   --  2.5* 2.3  PHOS  --   --  2.1*  --    GFR: Estimated Creatinine Clearance: 29.3 mL/min (A) (by C-G formula based on SCr of 1.05 mg/dL (H)). Liver Function Tests: Recent Labs  Lab 07/21/23 0924  AST 20  ALT 13  ALKPHOS 64  BILITOT 0.8  PROT 7.4  ALBUMIN 3.5   No results for input(s): "LIPASE", "AMYLASE" in the last 168 hours. No results for input(s): "AMMONIA" in the last 168 hours. Coagulation Profile: No results for input(s): "INR", "PROTIME" in the last 168 hours. Cardiac Enzymes: No results for input(s): "CKTOTAL", "CKMB", "CKMBINDEX", "TROPONINI" in the last 168 hours. BNP (last 3 results) No results for input(s): "PROBNP" in the last 8760 hours. HbA1C: No results for input(s): "HGBA1C" in the last 72 hours. CBG: No results for input(s): "GLUCAP" in the last 168 hours. Lipid Profile: No results for input(s): "CHOL", "HDL", "LDLCALC", "TRIG", "CHOLHDL", "LDLDIRECT" in the last 72 hours. Thyroid Function Tests: No results for  input(s): "TSH", "T4TOTAL", "FREET4", "T3FREE", "THYROIDAB" in the last 72 hours. Anemia Panel: No results for input(s): "VITAMINB12", "FOLATE", "FERRITIN", "TIBC", "IRON", "RETICCTPCT" in the last 72 hours. Sepsis Labs: Recent Labs  Lab 07/21/23 9604 07/21/23 1154 07/21/23 1434 07/21/23 1615 07/22/23 0419  PROCALCITON  --   --   --   --  0.31  LATICACIDVEN 2.3* 2.6* 2.1* 1.8  --     Recent Results (from the past 240 hours)  Resp panel by RT-PCR (RSV, Flu A&B, Covid) Anterior Nasal Swab     Status: None   Collection Time: 07/21/23  9:24 AM   Specimen: Anterior  Nasal Swab  Result Value Ref Range Status   SARS Coronavirus 2 by RT PCR NEGATIVE NEGATIVE Final    Comment: (NOTE) SARS-CoV-2 target nucleic acids are NOT DETECTED.  The SARS-CoV-2 RNA is generally detectable in upper respiratory specimens during the acute phase of infection. The lowest concentration of SARS-CoV-2 viral copies this assay can detect is 138 copies/mL. A negative result does not preclude SARS-Cov-2 infection and should not be used as the sole basis for treatment or other patient management decisions. A negative result may occur with  improper specimen collection/handling, submission of specimen other than nasopharyngeal swab, presence of viral mutation(s) within the areas targeted by this assay, and inadequate number of viral copies(<138 copies/mL). A negative result must be combined with clinical observations, patient history, and epidemiological information. The expected result is Negative.  Fact Sheet for Patients:  BloggerCourse.com  Fact Sheet for Healthcare Providers:  SeriousBroker.it  This test is no t yet approved or cleared by the [place]  FDA and  has been authorized for detection and/or diagnosis of SARS-CoV-2 by FDA under an Emergency Use Authorization (EUA). This EUA will remain  in effect (meaning this test can be used) for the  duration of the COVID-19 declaration under Section 564(b)(1) of the Act, 21 U.S.C.section 360bbb-3(b)(1), unless the authorization is terminated  or revoked sooner.       Influenza A by PCR NEGATIVE NEGATIVE Final   Influenza B by PCR NEGATIVE NEGATIVE Final    Comment: (NOTE) The Xpert Xpress SARS-CoV-2/FLU/RSV plus assay is intended as an aid in the diagnosis of influenza from Nasopharyngeal swab specimens and should not be used as a sole basis for treatment. Nasal washings and aspirates are unacceptable for Xpert Xpress SARS-CoV-2/FLU/RSV testing.  Fact Sheet for Patients: BloggerCourse.com  Fact Sheet for Healthcare Providers: SeriousBroker.it  This test is not yet approved or cleared by the [place]  FDA and has been authorized for detection and/or diagnosis of SARS-CoV-2 by FDA under an Emergency Use Authorization (EUA). This EUA will remain in effect (meaning this test can be used) for the duration of the COVID-19 declaration under Section 564(b)(1) of the Act, 21 U.S.C. section 360bbb-3(b)(1), unless the authorization is terminated or revoked.     Resp Syncytial Virus by PCR NEGATIVE NEGATIVE Final    Comment: (NOTE) Fact Sheet for Patients: BloggerCourse.com  Fact Sheet for Healthcare Providers: SeriousBroker.it  This test is not yet approved or cleared by the [place]  FDA and has been authorized for detection and/or diagnosis of SARS-CoV-2 by FDA under an Emergency Use Authorization (EUA). This EUA will remain in effect (meaning this test can be used) for the duration of the COVID-19 declaration under Section 564(b)(1) of the Act, 21 U.S.C. section 360bbb-3(b)(1), unless the authorization is terminated or revoked.  Performed at [facility], [address], [place], [place] [address]   Blood culture (routine x 2)     Status: None  (Preliminary result)   Collection Time: 07/21/23  9:24 AM   Specimen: BLOOD  Result Value Ref Range Status   Specimen Description   Final    BLOOD RIGHT ANTECUBITAL Performed at [facility], [address], [place], [place] [address]    Special Requests   Final    BOTTLES DRAWN AEROBIC AND ANAEROBIC Blood Culture adequate volume Performed at [facility], [address] [place], [place] [address]    Culture   Final    NO GROWTH 3 DAYS Performed at [facility], [address], [place], [place] [address]    Report Status PENDING  Incomplete  Blood culture (routine x 2)     Status: None (Preliminary result)   Collection Time: 07/21/23  9:56 AM   Specimen: BLOOD  Result Value Ref Range Status   Specimen Description   Final    BLOOD LEFT ANTECUBITAL Performed at [facility], [address], [place], [place] [address]    Special Requests   Final    BOTTLES DRAWN AEROBIC AND ANAEROBIC Blood Culture results may not be optimal due to an inadequate volume of blood received in culture bottles Performed at [facility], [address] [place], [place] [address]    Culture   Final    NO GROWTH 3 DAYS Performed at [facility], [address], [place], [place] [address]    Report Status PENDING  Incomplete         Radiology Studies: No results found.         LOS: 3 days   Time spent= 35 mins    Maggie Schooner, MD [facility]  If 7PM-7AM, please contact night-coverage  07/24/2023, 11:58 AM

## 2023-07-24 NOTE — Plan of Care (Signed)

## 2023-07-25 DIAGNOSIS — J189 Pneumonia, unspecified organism: Secondary | ICD-10-CM | POA: Diagnosis not present

## 2023-07-25 LAB — CBC
HCT: 39.7 % (ref 36.0–46.0)
Hemoglobin: 11.5 g/dL — ABNORMAL LOW (ref 12.0–15.0)
MCH: 29.6 pg (ref 26.0–34.0)
MCHC: 29 g/dL — ABNORMAL LOW (ref 30.0–36.0)
MCV: 102.1 fL — ABNORMAL HIGH (ref 80.0–100.0)
Platelets: 399 10*3/uL (ref 150–400)
RBC: 3.89 MIL/uL (ref 3.87–5.11)
RDW: 14.9 % (ref 11.5–15.5)
WBC: 12.9 10*3/uL — ABNORMAL HIGH (ref 4.0–10.5)
nRBC: 0.2 % (ref 0.0–0.2)

## 2023-07-25 LAB — BASIC METABOLIC PANEL WITH GFR
Anion gap: 12 (ref 5–15)
BUN: 38 mg/dL — ABNORMAL HIGH (ref 8–23)
CO2: 27 mmol/L (ref 22–32)
Calcium: 9.2 mg/dL (ref 8.9–10.3)
Chloride: 102 mmol/L (ref 98–111)
Creatinine, Ser: 0.91 mg/dL (ref 0.44–1.00)
GFR, Estimated: 58 mL/min — ABNORMAL LOW (ref >60)
Glucose, Bld: 187 mg/dL — ABNORMAL HIGH (ref 70–99)
Potassium: 4.1 mmol/L (ref 3.5–5.1)
Sodium: 141 mmol/L (ref 135–145)

## 2023-07-25 LAB — MAGNESIUM: Magnesium: 2.6 mg/dL — ABNORMAL HIGH (ref 1.7–2.4)

## 2023-07-25 MED ORDER — FUROSEMIDE 10 MG/ML IJ SOLN
40.0000 mg | Freq: Once | INTRAMUSCULAR | Status: AC
  Administered 2023-07-25: 40 mg via INTRAVENOUS
  Filled 2023-07-25: qty 4

## 2023-07-25 MED ORDER — LOSARTAN POTASSIUM 50 MG PO TABS
100.0000 mg | ORAL_TABLET | Freq: Every day | ORAL | Status: DC
  Administered 2023-07-25 – 2023-07-27 (×3): 100 mg via ORAL
  Filled 2023-07-25 (×3): qty 2

## 2023-07-25 MED ORDER — TORSEMIDE 10 MG PO TABS
30.0000 mg | ORAL_TABLET | Freq: Every day | ORAL | Status: DC
  Administered 2023-07-26 – 2023-07-27 (×2): 30 mg via ORAL
  Filled 2023-07-25 (×2): qty 3

## 2023-07-25 NOTE — Progress Notes (Signed)
 PROGRESS NOTE    Jody Blake  VOZ:[id number] DOB: 1925-07-06 DOA: 07/21/2023 PCP: Marguerite Shiley, MD    Brief Narrative:   89 year old with history of COPD on room air, diastolic dysfunction, DVT, paroxysmal A-fib on Xarelto , legally blind, glaucoma, hard of hearing admitted to the hospital for sepsis secondary to community-acquired pneumonia.  Patient started having URI type of symptoms about 5 days ago and PCP had placed her on prednisone  2 days prior to hospitalization.  Upon admission noted to have hypoxia therefore started on steroids and bronchodilators.  She is also required gentle diuresis.  Assessment & Plan:  Principal Problem:   CAP (community acquired pneumonia) Active Problems:   Sepsis due to pneumonia (HCC)   Severe sepsis secondary to community-acquired pneumonia COPD exacerbation - Currently started on Rocephin  and azithromycin  - COVID, RSV, flu, = neg - Procalcitonin - 0.31, BNP 428 - Continue steroids - Bronchodilators, I-S/flutter valve  Essential hypertension; uncontrolled -Started Norvasc .  Increase Losartan .  IV as needed.   Acute on congestive heart failure exacerbation with preserved EF - 1 more dose of IV Lasix  today, will resume scheduled torsemide  tomorrow - Continue losartan  and metoprolol   Atrial fibrillation, chronic - Lopressor  and Xarelto .  IV as needed   GERD - Pepcid   Peripheral neuropathy - Bedtime gabapentin    Hypothyroidism - Synthroid   Hypokalemia/hypophosphatemia - Replacement   DVT prophylaxis: Rivaroxaban  (XARELTO ) tablet 15 mg     Code Status: Do not attempt resuscitation (DNR) PRE-ARREST INTERVENTIONS DESIRED Family Communication: Caregiver at bedside Status is: Inpatient Remains inpatient appropriate because: Continue hospital stay for treatment of sepsis secondary to CAP Expect atleast 48 hrs of hosp stay   Subjective: Still feeling quite weak today.  Sitting up in the chair Overall tells me she feels  slightly worse than yesterday in terms of her congestion  Examination:  General exam: Appears calm and comfortable, elderly frail Respiratory system: diffuse rhonchi, slightly worse than yesterday Cardiovascular system: S1 & S2 heard, RRR. No JVD, murmurs, rubs, gallops or clicks. No pedal edema. Gastrointestinal system: Abdomen is nondistended, soft and nontender. No organomegaly or masses felt. Normal bowel sounds heard. Central nervous system: Alert and oriented. No focal neurological deficits. Extremities: Symmetric 5 x 5 power. Skin: No rashes, lesions or ulcers Psychiatry: Judgement and insight appear normal. Mood & affect appropriate.                Diet Orders (From admission, onward)     Start     Ordered   07/21/23 1312  Diet Heart Room service appropriate? Yes; Fluid consistency: Thin  Diet effective now       Question Answer Comment  Room service appropriate? Yes   Fluid consistency: Thin      07/21/23 1312            Objective: Vitals:   07/24/23 2202 07/25/23 0507 07/25/23 0629 07/25/23 0922  BP: (!) 189/84 (!) 231/95 (!) 173/70   Pulse: 66 62 64   Resp: 17 19 18    Temp: 98.7 F (37.1 C) 97.9 F (36.6 C) 97.7 F (36.5 C)   TempSrc:   Axillary   SpO2: 98% 98%  96%  Weight:      Height:        Intake/Output Summary (Last 24 hours) at 07/25/2023 1217 Last data filed at 07/25/2023 0830 Gross per 24 hour  Intake 120 ml  Output --  Net 120 ml   Filed Weights   07/21/23 1400  Weight: 66.1 kg  Scheduled Meds:  amLODipine   5 mg Oral Daily   arformoterol   15 mcg Nebulization BID   home med stored in pyxis  3 each Oral QHS   dorzolamide   1 drop Both Eyes BID   famotidine   20 mg Oral QHS   gabapentin   200 mg Oral QHS   guaiFENesin   1,200 mg Oral BID   latanoprost   1 drop Both Eyes QHS   levothyroxine   75 mcg Oral Q0600   losartan   100 mg Oral Daily   methylPREDNISolone  (SOLU-MEDROL ) injection  40 mg Intravenous Q12H   metoprolol   tartrate  75 mg Oral Daily   montelukast   10 mg Oral QHS   revefenacin   175 mcg Nebulization Daily   rivaroxaban   15 mg Oral Q supper   [START ON 07/26/2023] torsemide   30 mg Oral Daily   Continuous Infusions:  Nutritional status     Body mass index is 25.83 kg/m.  Data Reviewed:   CBC: Recent Labs  Lab 07/21/23 0924 07/22/23 0419 07/23/23 0411 07/24/23 0435 07/25/23 0413  WBC 15.6* 12.5* 14.0* 11.5* 12.9*  HGB 11.6* 9.6* 10.5* 10.2* 11.5*  HCT 38.9 32.6* 36.0 33.7* 39.7  MCV 101.6* 100.9* 102.6* 99.1 102.1*  PLT 405* 305 345 338 399   Basic Metabolic Panel: Recent Labs  Lab 07/21/23 0924 07/22/23 0419 07/23/23 0411 07/24/23 0435 07/25/23 0413  NA 145 142 141 143 141  K 3.5 3.8 3.4* 3.6 4.1  CL 105 108 104 105 102  CO2 26 24 26 28 27   GLUCOSE 144* 204* 179* 172* 187*  BUN 35* 29* 36* 38* 38*  CREATININE 1.34* 1.11* 1.08* 1.05* 0.91  CALCIUM  10.2 9.4 9.4 8.8* 9.2  MG  --   --  2.5* 2.3 2.6*  PHOS  --   --  2.1*  --   --    GFR: Estimated Creatinine Clearance: 33.8 mL/min (by C-G formula based on SCr of 0.91 mg/dL). Liver Function Tests: Recent Labs  Lab 07/21/23 0924  AST 20  ALT 13  ALKPHOS 64  BILITOT 0.8  PROT 7.4  ALBUMIN 3.5   No results for input(s): "LIPASE", "AMYLASE" in the last 168 hours. No results for input(s): "AMMONIA" in the last 168 hours. Coagulation Profile: No results for input(s): "INR", "PROTIME" in the last 168 hours. Cardiac Enzymes: No results for input(s): "CKTOTAL", "CKMB", "CKMBINDEX", "TROPONINI" in the last 168 hours. BNP (last 3 results) No results for input(s): "PROBNP" in the last 8760 hours. HbA1C: No results for input(s): "HGBA1C" in the last 72 hours. CBG: No results for input(s): "GLUCAP" in the last 168 hours. Lipid Profile: No results for input(s): "CHOL", "HDL", "LDLCALC", "TRIG", "CHOLHDL", "LDLDIRECT" in the last 72 hours. Thyroid  Function Tests: No results for input(s): "TSH", "T4TOTAL", "FREET4",  "T3FREE", "THYROIDAB" in the last 72 hours. Anemia Panel: No results for input(s): "VITAMINB12", "FOLATE", "FERRITIN", "TIBC", "IRON", "RETICCTPCT" in the last 72 hours. Sepsis Labs: Recent Labs  Lab 07/21/23 7829 07/21/23 1154 07/21/23 1434 07/21/23 1615 07/22/23 0419  PROCALCITON  --   --   --   --  0.31  LATICACIDVEN 2.3* 2.6* 2.1* 1.8  --     Recent Results (from the past 240 hours)  Resp panel by RT-PCR (RSV, Flu A&B, Covid) Anterior Nasal Swab     Status: None   Collection Time: 07/21/23  9:24 AM   Specimen: Anterior Nasal Swab  Result Value Ref Range Status   SARS Coronavirus 2 by RT PCR NEGATIVE NEGATIVE Final  Comment: (NOTE) SARS-CoV-2 target nucleic acids are NOT DETECTED.  The SARS-CoV-2 RNA is generally detectable in upper respiratory specimens during the acute phase of infection. The lowest concentration of SARS-CoV-2 viral copies this assay can detect is 138 copies/mL. A negative result does not preclude SARS-Cov-2 infection and should not be used as the sole basis for treatment or other patient management decisions. A negative result may occur with  improper specimen collection/handling, submission of specimen other than nasopharyngeal swab, presence of viral mutation(s) within the areas targeted by this assay, and inadequate number of viral copies(<138 copies/mL). A negative result must be combined with clinical observations, patient history, and epidemiological information. The expected result is Negative.  Fact Sheet for Patients:  BloggerCourse.com  Fact Sheet for Healthcare Providers:  SeriousBroker.it  This test is no t yet approved or cleared by the [place]  FDA and  has been authorized for detection and/or diagnosis of SARS-CoV-2 by FDA under an Emergency Use Authorization (EUA). This EUA will remain  in effect (meaning this test can be used) for the duration of the COVID-19 declaration  under Section 564(b)(1) of the Act, 21 U.S.C.section 360bbb-3(b)(1), unless the authorization is terminated  or revoked sooner.       Influenza A by PCR NEGATIVE NEGATIVE Final   Influenza B by PCR NEGATIVE NEGATIVE Final    Comment: (NOTE) The Xpert Xpress SARS-CoV-2/FLU/RSV plus assay is intended as an aid in the diagnosis of influenza from Nasopharyngeal swab specimens and should not be used as a sole basis for treatment. Nasal washings and aspirates are unacceptable for Xpert Xpress SARS-CoV-2/FLU/RSV testing.  Fact Sheet for Patients: BloggerCourse.com  Fact Sheet for Healthcare Providers: SeriousBroker.it  This test is not yet approved or cleared by the [place]  FDA and has been authorized for detection and/or diagnosis of SARS-CoV-2 by FDA under an Emergency Use Authorization (EUA). This EUA will remain in effect (meaning this test can be used) for the duration of the COVID-19 declaration under Section 564(b)(1) of the Act, 21 U.S.C. section 360bbb-3(b)(1), unless the authorization is terminated or revoked.     Resp Syncytial Virus by PCR NEGATIVE NEGATIVE Final    Comment: (NOTE) Fact Sheet for Patients: BloggerCourse.com  Fact Sheet for Healthcare Providers: SeriousBroker.it  This test is not yet approved or cleared by the [place]  FDA and has been authorized for detection and/or diagnosis of SARS-CoV-2 by FDA under an Emergency Use Authorization (EUA). This EUA will remain in effect (meaning this test can be used) for the duration of the COVID-19 declaration under Section 564(b)(1) of the Act, 21 U.S.C. section 360bbb-3(b)(1), unless the authorization is terminated or revoked.  Performed at [facility], [address], [place], [place] [address]   Blood culture (routine x 2)     Status: None (Preliminary result)   Collection Time:  07/21/23  9:24 AM   Specimen: BLOOD  Result Value Ref Range Status   Specimen Description   Final    BLOOD RIGHT ANTECUBITAL Performed at [facility], [address] [place], [place] [address]    Special Requests   Final    BOTTLES DRAWN AEROBIC AND ANAEROBIC Blood Culture adequate volume Performed at [facility], [address] [place], [place] [address]    Culture   Final    NO GROWTH 4 DAYS Performed at [facility], [address] [place], [place] [address]    Report Status PENDING  Incomplete  Blood culture (routine x  2)     Status: None (Preliminary result)   Collection Time: 07/21/23  9:56 AM   Specimen: BLOOD  Result Value Ref Range Status   Specimen Description   Final    BLOOD LEFT ANTECUBITAL Performed at [facility], [address] [place], [place] [address]    Special Requests   Final    BOTTLES DRAWN AEROBIC AND ANAEROBIC Blood Culture results may not be optimal due to an inadequate volume of blood received in culture bottles Performed at [facility], [address] [place], [place] [address]    Culture   Final    NO GROWTH 4 DAYS Performed at [facility], [address], [place], [place] [address]    Report Status PENDING  Incomplete         Radiology Studies: No results found.         LOS: 4 days   Time spent= 35 mins    Maggie Schooner, MD [facility]  If 7PM-7AM, please contact night-coverage  07/25/2023, 12:17 PM

## 2023-07-25 NOTE — TOC Progression Note (Signed)
 Transition of Care [facility]) - Progression Note    Patient Details  Name: Jody Blake MRN: [id number] Date of Birth: 19-Feb-1926  [facility] of [facility] CM/SW Contact  Gertha Ku, LCSW Phone Number: 07/25/2023, 2:31 PM  Clinical Narrative:    CSW spoke with Judee Norway admission with [facility]. She stated they will not have SNF bed until next week. CSW to complete FL2 and send pt out for SNF placement. TOC to follow.   Expected Discharge Plan: Skilled Nursing Facility Barriers to Discharge: Continued Medical Work up  Expected Discharge Plan and Services       Living arrangements for the past 2 months: Independent Living Facility                                       Social Determinants of Health (SDOH) Interventions SDOH Screenings   Food Insecurity: No Food Insecurity (07/21/2023)  Housing: Low Risk  (07/21/2023)  Transportation Needs: No Transportation Needs (07/21/2023)  Utilities: Not At Risk (07/21/2023)  Alcohol Screen: Low Risk  (02/16/2021)  Depression (PHQ2-9): Low Risk  (06/30/2023)  Financial Resource Strain: Low Risk  (02/16/2021)  Physical Activity: Sufficiently Active (02/16/2021)  Social Connections: Moderately Isolated (07/21/2023)  Stress: No Stress Concern Present (02/16/2021)  Tobacco Use: Low Risk  (07/21/2023)    Readmission Risk Interventions     No data to display

## 2023-07-25 NOTE — Plan of Care (Signed)
  Problem: Clinical Measurements: Goal: Ability to maintain clinical measurements within normal limits will improve Outcome: Progressing Goal: Diagnostic test results will improve Outcome: Progressing Goal: Respiratory complications will improve Outcome: Progressing   

## 2023-07-25 NOTE — Progress Notes (Signed)
 PT Cancellation Note  Patient Details Name: Jody Blake MRN: [id number] DOB: 11/12/1925   Cancelled Treatment:    Reason Eval/Treat Not Completed: Fatigue/lethargy limiting ability to participate (pt sleeping soundly, caregiver requested pt not be disturbed, RN reports pt ambulated with him in the hallway earlier today. Will follow.)  Daymon Evans PT 07/25/2023  Acute Rehabilitation Services  Office [phone]

## 2023-07-25 NOTE — Progress Notes (Signed)
 Occupational Therapy Treatment Patient Details Name: Jody Blake MRN: [id number] DOB: [birth year] Today's Date: 07/25/2023   History of present illness 89 year old female who presented with 5 day history of URI symptoms. Patient was admitted with severe sepsis, CAP, COPD exacerbation. PMH; a fib, GERD, peripheral neuropathy, DVT, legally blind, glaucoma, diastolic dysfunction.   OT comments  Patient seen for skilled OT visit this noon. Patient's regular PCA from IL[facility] and daughter present in room for education. Patient open to all activity presented to improve overall activity tolerance and balance for BADL's and mobility as outlined below. Rests between all activity required. Patient continues to require Acute care OT services to promote functional progression. Patient will benefit from continued inpatient follow up therapy, <3 hours/day.        If plan is discharge home, recommend the following:  A lot of help with bathing/dressing/bathroom;Assistance with cooking/housework;Direct supervision/assist for medications management;Assist for transportation;Help with stairs or ramp for entrance;Direct supervision/assist for financial management   Equipment Recommendations  None recommended by OT       Precautions / Restrictions Precautions Precautions: Fall Precaution/Restrictions Comments: monitor O2 Restrictions Weight Bearing Restrictions Per Provider Order: No       Mobility Bed Mobility               General bed mobility comments:  (patient in recliner)    Transfers Overall transfer level: Needs assistance Equipment used: Rolling walker (2 wheels) Transfers: Sit to/from Stand, Bed to chair/wheelchair/BSC Sit to Stand: Min assist (cues for forward weight shifts and powering up to stand)           General transfer comment: facilitation and cues     Balance Overall balance assessment: Needs assistance         Standing balance support: During functional activity,  Reliant on assistive device for balance, Bilateral upper extremity supported Standing balance-Leahy Scale: Fair                             ADL either performed or assessed with clinical judgement   ADL Overall ADL's : Needs assistance/impaired Eating/Feeding: Set up;Sitting   Grooming: Sitting;Set up   Upper Body Bathing: Sitting;Set up   Lower Body Bathing: Moderate assistance;Sitting/lateral leans;Sit to/from stand   Upper Body Dressing : Sitting;Minimal assistance   Lower Body Dressing: Sitting/lateral leans;Moderate assistance   Toilet Transfer: Minimal assistance;Ambulation;Rolling walker (2 wheels)             General ADL Comments: worked on energy conservation strategies with pca and dtr present, breathing strategies, use of IS 5 reps x 3 sets    Extremity/Trunk Assessment Upper Extremity Assessment Upper Extremity Assessment: Right hand dominant;RUE deficits/detail (RTC issues R shoulder)   Lower Extremity Assessment Lower Extremity Assessment: Generalized weakness        Vision   Additional Comments: low vision with legal blindness per chart review   Perception Perception Perception: Impaired   Praxis Praxis Praxis: WFL   Communication Communication Communication: Impaired Factors Affecting Communication: Hearing impaired   Cognition Arousal: Alert Behavior During Therapy: WFL for tasks assessed/performed Cognition: Difficult to assess Difficult to assess due to: Hard of hearing/deaf           OT - Cognition Comments: decreased overall processing due to [facility] and low vision                 Following commands: Intact        Cueing  Cueing Techniques: Verbal cues  Exercises Exercises: General Upper Extremity (gentle UE ROM with breathing integration 5 reps x 5 sets)       General Comments pressure reliefs with sit to stand x 6    Pertinent Vitals/ Pain       Pain Assessment Pain Assessment: No/denies  pain   Frequency  Min 1X/week        Progress Toward Goals  OT Goals(current goals can now be found in the care plan section)  Progress towards OT goals: Progressing toward goals  Acute Rehab OT Goals Patient Stated Goal: to be stronger and go home OT Goal Formulation: With patient/family Time For Goal Achievement: 08/05/23 Potential to Achieve Goals: Fair ADL Goals Pt Will Perform Grooming: with modified independence;standing Pt Will Perform Lower Body Dressing: with supervision;with adaptive equipment;sitting/lateral leans Pt Will Transfer to Toilet: with supervision;ambulating;regular height toilet Pt Will Perform Toileting - Clothing Manipulation and hygiene: with supervision;sit to/from stand  Plan         AM-PAC OT "6 Clicks" Daily Activity     Outcome Measure   Help from another person eating meals?: A Little Help from another person taking care of personal grooming?: A Little Help from another person toileting, which includes using toliet, bedpan, or urinal?: A Little Help from another person bathing (including washing, rinsing, drying)?: A Lot Help from another person to put on and taking off regular upper body clothing?: A Little Help from another person to put on and taking off regular lower body clothing?: A Lot 6 Click Score: 16    End of Session Equipment Utilized During Treatment: Gait belt;Rolling walker (2 wheels);Oxygen   OT Visit Diagnosis: Unsteadiness on feet (R26.81);Other abnormalities of gait and mobility (R26.89)   Activity Tolerance Patient tolerated treatment well   Patient Left in chair;with call bell/phone within reach;with family/visitor present   Nurse Communication Mobility status        Time: 1200-1220 OT Time Calculation (min): 20 min  Charges: OT General Charges $OT Visit: 1 Visit OT Treatments $Therapeutic Activity: 8-22 mins  Briahnna Harries OT/L Acute Rehabilitation Department  [phone]  07/25/2023, 1:14 PM

## 2023-07-25 NOTE — Progress Notes (Signed)
 SATURATION QUALIFICATIONS: (This note is used to comply with regulatory documentation for home oxygen )  Patient Saturations on Room Air at Rest = 96%  Patient Saturations on Room Air while Ambulating = 97%  Patient Saturations on 0 Liters of oxygen  while Ambulating = 97%  Please briefly explain why patient needs home oxygen : No O2 needed

## 2023-07-26 DIAGNOSIS — J189 Pneumonia, unspecified organism: Secondary | ICD-10-CM | POA: Diagnosis not present

## 2023-07-26 DIAGNOSIS — R0902 Hypoxemia: Secondary | ICD-10-CM | POA: Diagnosis not present

## 2023-07-26 DIAGNOSIS — A419 Sepsis, unspecified organism: Secondary | ICD-10-CM

## 2023-07-26 LAB — CBC
HCT: 34.4 % — ABNORMAL LOW (ref 36.0–46.0)
Hemoglobin: 10.4 g/dL — ABNORMAL LOW (ref 12.0–15.0)
MCH: 29.9 pg (ref 26.0–34.0)
MCHC: 30.2 g/dL (ref 30.0–36.0)
MCV: 98.9 fL (ref 80.0–100.0)
Platelets: 434 10*3/uL — ABNORMAL HIGH (ref 150–400)
RBC: 3.48 MIL/uL — ABNORMAL LOW (ref 3.87–5.11)
RDW: 14.6 % (ref 11.5–15.5)
WBC: 11.4 10*3/uL — ABNORMAL HIGH (ref 4.0–10.5)
nRBC: 0 % (ref 0.0–0.2)

## 2023-07-26 LAB — BASIC METABOLIC PANEL WITH GFR
Anion gap: 9 (ref 5–15)
BUN: 38 mg/dL — ABNORMAL HIGH (ref 8–23)
CO2: 29 mmol/L (ref 22–32)
Calcium: 9.1 mg/dL (ref 8.9–10.3)
Chloride: 103 mmol/L (ref 98–111)
Creatinine, Ser: 1 mg/dL (ref 0.44–1.00)
GFR, Estimated: 52 mL/min — ABNORMAL LOW (ref >60)
Glucose, Bld: 175 mg/dL — ABNORMAL HIGH (ref 70–99)
Potassium: 3.5 mmol/L (ref 3.5–5.1)
Sodium: 141 mmol/L (ref 135–145)

## 2023-07-26 LAB — CULTURE, BLOOD (ROUTINE X 2)
Culture: NO GROWTH
Culture: NO GROWTH
Special Requests: ADEQUATE

## 2023-07-26 LAB — MAGNESIUM: Magnesium: 2.7 mg/dL — ABNORMAL HIGH (ref 1.7–2.4)

## 2023-07-26 NOTE — Progress Notes (Signed)
 [facility]  PROGRESS NOTE  Jody Blake QMV:[id number] DOB: 24-May-1925 DOA: 07/21/2023 PCP: Marguerite Shiley, MD   Brief HPI:   89 year old with history of COPD on room air, diastolic dysfunction, DVT, paroxysmal A-fib on Xarelto , legally blind, glaucoma, hard of hearing admitted to the hospital for sepsis secondary to community-acquired pneumonia.  Patient started having URI type of symptoms about 5 days ago and PCP had placed her on prednisone  2 days prior to hospitalization.  Upon admission noted to have hypoxia therefore started on steroids and bronchodilators.  She is also required gentle diuresis.      Assessment/Plan:   Severe sepsis secondary to community-acquired pneumonia COPD exacerbation - Currently started on Rocephin  and azithromycin  - COVID, RSV, flu, = neg - Procalcitonin - 0.31, BNP 428 - Continue steroids - Bronchodilators, I-S/flutter valve   Essential hypertension; uncontrolled -Started Norvasc .  Increase Losartan .  IV as needed.     Acute on congestive heart failure exacerbation with preserved EF - Continue torsemide  - Continue losartan  and metoprolol    Atrial fibrillation, chronic - Lopressor  and Xarelto .  IV as needed     GERD - Pepcid    Peripheral neuropathy - Bedtime gabapentin      Hypothyroidism - Synthroid    Hypokalemia/hypophosphatemia - Replacement    Medications     amLODipine   5 mg Oral Daily   arformoterol   15 mcg Nebulization BID   home med stored in pyxis  3 each Oral QHS   dorzolamide   1 drop Both Eyes BID   famotidine   20 mg Oral QHS   gabapentin   200 mg Oral QHS   guaiFENesin   1,200 mg Oral BID   latanoprost   1 drop Both Eyes QHS   levothyroxine   75 mcg Oral Q0600   losartan   100 mg Oral Daily   methylPREDNISolone  (SOLU-MEDROL ) injection  40 mg Intravenous Q12H   metoprolol  tartrate  75 mg Oral Daily   montelukast   10 mg Oral QHS   revefenacin   175 mcg Nebulization Daily   rivaroxaban   15 mg Oral Q supper    torsemide   30 mg Oral Daily     Data Reviewed:   CBG:  No results for input(s): "GLUCAP" in the last 168 hours.  SpO2: 98 % O2 Flow Rate (L/min): 2 L/min FiO2 (%): 100 %    Vitals:   07/25/23 1949 07/26/23 0520 07/26/23 0655 07/26/23 0751  BP: (!) 151/74 (!) 176/75 (!) 143/69   Pulse: 70 (!) 59 (!) 59   Resp: 20 18    Temp: 98.1 F (36.7 C) 98.4 F (36.9 C)    TempSrc: Oral Oral    SpO2: 96% 95%  98%  Weight:      Height:          Data Reviewed:  Basic Metabolic Panel: Recent Labs  Lab 07/22/23 0419 07/23/23 0411 07/24/23 0435 07/25/23 0413 07/26/23 0410  NA 142 141 143 141 141  K 3.8 3.4* 3.6 4.1 3.5  CL 108 104 105 102 103  CO2 24 26 28 27 29   GLUCOSE 204* 179* 172* 187* 175*  BUN 29* 36* 38* 38* 38*  CREATININE 1.11* 1.08* 1.05* 0.91 1.00  CALCIUM  9.4 9.4 8.8* 9.2 9.1  MG  --  2.5* 2.3 2.6* 2.7*  PHOS  --  2.1*  --   --   --     CBC: Recent Labs  Lab 07/22/23 0419 07/23/23 0411 07/24/23 0435 07/25/23 0413 07/26/23 0410  WBC 12.5* 14.0* 11.5* 12.9* 11.4*  HGB  9.6* 10.5* 10.2* 11.5* 10.4*  HCT 32.6* 36.0 33.7* 39.7 34.4*  MCV 100.9* 102.6* 99.1 102.1* 98.9  PLT 305 345 338 399 434*    LFT Recent Labs  Lab 07/21/23 0924  AST 20  ALT 13  ALKPHOS 64  BILITOT 0.8  PROT 7.4  ALBUMIN 3.5     Antibiotics: Anti-infectives (From admission, onward)    Start     Dose/Rate Route Frequency Ordered Stop   07/23/23 1000  azithromycin  (ZITHROMAX ) tablet 500 mg        500 mg Oral Daily 07/22/23 0928 07/25/23 0902   07/22/23 1000  azithromycin  (ZITHROMAX ) 500 mg in sodium chloride  0.9 % 250 mL IVPB  Status:  Discontinued        500 mg 250 mL/hr over 60 Minutes Intravenous Every 24 hours 07/21/23 1309 07/22/23 0928   07/22/23 1000  cefTRIAXone  (ROCEPHIN ) 1 g in sodium chloride  0.9 % 100 mL IVPB        1 g 200 mL/hr over 30 Minutes Intravenous Every 24 hours 07/21/23 1309 07/25/23 1011   07/21/23 0845  cefTRIAXone  (ROCEPHIN ) 1 g in sodium  chloride 0.9 % 100 mL IVPB        1 g 200 mL/hr over 30 Minutes Intravenous  Once 07/21/23 0830 07/21/23 1153   07/21/23 0845  azithromycin  (ZITHROMAX ) 500 mg in sodium chloride  0.9 % 250 mL IVPB        500 mg 250 mL/hr over 60 Minutes Intravenous  Once 07/21/23 0830 07/21/23 1153        DVT prophylaxis: Xarelto   Code Status: DNR  Family Communication: Discussed with patient's daughter at bedside   CONSULTS    Subjective   Still coughing up phlegm   Objective    Physical Examination:   General-appears in no acute distress Heart-S1-S2, regular, no murmur auscultated Lungs- scattered wheezing bilaterally Abdomen-soft, nontender, no organomegaly Extremities-no edema in the lower extremities Neuro-alert, oriented x3, no focal deficit noted  Status is: Inpatient:             Jody Blake   [facility] If 7PM-7AM, please contact night-coverage at [email or web address], Office  [phone]   07/26/2023, 11:06 AM  LOS: 5 days

## 2023-07-26 NOTE — Progress Notes (Signed)
 Mobility Specialist - Progress Note  (RA) Pre-mobility: 60 bpm HR, 93% SpO2 During mobility: 80 bpm HR, 93% SpO2 Post-mobility: 60 bpm HR, 95% SPO2, 177/74 mmHg (99 MAP) BP,   07/26/23 0952  Mobility  Activity Ambulated with assistance in hallway  Level of Assistance Minimal assist, patient does 75% or more  Assistive Device Front wheel walker  Distance Ambulated (ft) 35 ft  Range of Motion/Exercises Active  Activity Response Tolerated poorly  Mobility visit 1 Mobility  Mobility Specialist Start Time (ACUTE ONLY) 0930  Mobility Specialist Stop Time (ACUTE ONLY) [id number]  Mobility Specialist Time Calculation (min) (ACUTE ONLY) 22 min   Pt was found in bed and agreeable to ambulate. Stated feeling dizzy during ambulation and RN proceeded to get recliner chair from room. Pt wheeled back to room and BP checked and recorded above. Was left with RN and caretaker in room.  Lorna Rose Mobility Specialist

## 2023-07-27 DIAGNOSIS — J189 Pneumonia, unspecified organism: Secondary | ICD-10-CM | POA: Diagnosis not present

## 2023-07-27 DIAGNOSIS — J441 Chronic obstructive pulmonary disease with (acute) exacerbation: Secondary | ICD-10-CM

## 2023-07-27 DIAGNOSIS — A419 Sepsis, unspecified organism: Secondary | ICD-10-CM | POA: Diagnosis not present

## 2023-07-27 LAB — BASIC METABOLIC PANEL WITH GFR
Anion gap: 11 (ref 5–15)
BUN: 40 mg/dL — ABNORMAL HIGH (ref 8–23)
CO2: 28 mmol/L (ref 22–32)
Calcium: 8.6 mg/dL — ABNORMAL LOW (ref 8.9–10.3)
Chloride: 102 mmol/L (ref 98–111)
Creatinine, Ser: 1.07 mg/dL — ABNORMAL HIGH (ref 0.44–1.00)
GFR, Estimated: 48 mL/min — ABNORMAL LOW (ref >60)
Glucose, Bld: 185 mg/dL — ABNORMAL HIGH (ref 70–99)
Potassium: 3.5 mmol/L (ref 3.5–5.1)
Sodium: 141 mmol/L (ref 135–145)

## 2023-07-27 LAB — CBC
HCT: 34.3 % — ABNORMAL LOW (ref 36.0–46.0)
Hemoglobin: 10.3 g/dL — ABNORMAL LOW (ref 12.0–15.0)
MCH: 29.8 pg (ref 26.0–34.0)
MCHC: 30 g/dL (ref 30.0–36.0)
MCV: 99.1 fL (ref 80.0–100.0)
Platelets: 404 10*3/uL — ABNORMAL HIGH (ref 150–400)
RBC: 3.46 MIL/uL — ABNORMAL LOW (ref 3.87–5.11)
RDW: 14.4 % (ref 11.5–15.5)
WBC: 13.3 10*3/uL — ABNORMAL HIGH (ref 4.0–10.5)
nRBC: 0 % (ref 0.0–0.2)

## 2023-07-27 LAB — MAGNESIUM: Magnesium: 2.6 mg/dL — ABNORMAL HIGH (ref 1.7–2.4)

## 2023-07-27 MED ORDER — AMLODIPINE BESYLATE 5 MG PO TABS: 5.0000 mg | ORAL_TABLET | Freq: Every day | ORAL | 2 refills | Status: DC

## 2023-07-27 MED ORDER — PREDNISONE 5 MG PO TABS: 2.5000 mg | ORAL_TABLET | Freq: Every day | ORAL | 3 refills | Status: DC

## 2023-07-27 MED ORDER — LOSARTAN POTASSIUM 100 MG PO TABS
100.0000 mg | ORAL_TABLET | Freq: Every day | ORAL | 2 refills | Status: DC
Start: 2023-07-28 — End: 2023-07-29

## 2023-07-27 NOTE — TOC Progression Note (Addendum)
 Transition of Care [facility]) - Progression Note    Patient Details  Name: Jody Blake MRN: [id number] Date of Birth: 07/08/1925  [facility] CM/SW Contact  Levie Ream, RN Phone Number: 07/27/2023, 10:46 AM  Clinical Narrative:    Verna Gola, RN at [facility]; she says no beds available on SNF today, but anticipate have bed tomorrow; Dr Alfonse Angle notified via secure chat; will pass on to oncoming TOC for follow up.   Expected Discharge Plan: Skilled Nursing Facility Barriers to Discharge: Continued Medical Work up  Expected Discharge Plan and Services       Living arrangements for the past 2 months: Independent Living Facility Expected Discharge Date: 07/27/23                                     Social Determinants of Health (SDOH) Interventions SDOH Screenings   Food Insecurity: No Food Insecurity (07/21/2023)  Housing: Low Risk  (07/21/2023)  Transportation Needs: No Transportation Needs (07/21/2023)  Utilities: Not At Risk (07/21/2023)  Alcohol Screen: Low Risk  (02/16/2021)  Depression (PHQ2-9): Low Risk  (06/30/2023)  Financial Resource Strain: Low Risk  (02/16/2021)  Physical Activity: Sufficiently Active (02/16/2021)  Social Connections: Moderately Isolated (07/21/2023)  Stress: No Stress Concern Present (02/16/2021)  Tobacco Use: Low Risk  (07/21/2023)    Readmission Risk Interventions     No data to display

## 2023-07-27 NOTE — Discharge Summary (Signed)
 Physician Discharge Summary   Patient: Jody Blake MRN: [id number] DOB: 08-08-1925  Admit date:     07/21/2023  Discharge date: 07/27/23  Discharge Physician: Ozell Blunt   PCP: Marguerite Shiley, MD   Recommendations at discharge:   Follow-up PCP in 1 week  Discharge Diagnoses: Principal Problem:   CAP (community acquired pneumonia) Active Problems:   Sepsis due to pneumonia [facility])  Resolved Problems:   * No resolved hospital problems. [facility] Course: 89 year old with history of COPD on room air, diastolic dysfunction, DVT, paroxysmal A-fib on Xarelto , legally blind, glaucoma, hard of hearing admitted to the hospital for sepsis secondary to community-acquired pneumonia.  Patient started having URI type of symptoms about 5 days ago and PCP had placed her on prednisone  2 days prior to hospitalization.  Upon admission noted to have hypoxia therefore started on steroids and bronchodilators.  She is also required gentle diuresis.   Severe sepsis secondary to community-acquired pneumonia COPD exacerbation - Currently started on Rocephin  and azithromycin  -Antibiotics have been discontinued - COVID, RSV, flu, = neg - Procalcitonin - 0.31, BNP 428 - Continue steroids, prednisone  2.5 mg daily - Bronchodilators   Essential hypertension; uncontrolled -Started Norvasc  5 mg daily.   Increase Losartan  to 100 mg daily.     Acute on congestive heart failure exacerbation with preserved EF - Continue torsemide  - Continue losartan  and metoprolol    Atrial fibrillation, chronic - Lopressor  and Xarelto .      GERD - Pepcid    Peripheral neuropathy - Bedtime gabapentin      Hypothyroidism - Synthroid    Hypokalemia/hypophosphatemia - Replacement        Consultants:  Procedures performed:  Disposition: Home Diet recommendation:  Discharge Diet Orders (From admission, onward)     Start     Ordered   07/27/23 0000  Diet - low sodium heart healthy        07/27/23 1023            Cardiac diet DISCHARGE MEDICATION: Allergies as of 07/27/2023       Reactions   Combigan [brimonidine Tartrate-timolol] Other (See Comments)   Systemic malaise amigen eye drop   Tape Other (See Comments)   SKIN IS VERY FRAGILE!!   Breo Ellipta  [fluticasone  Furoate-vilanterol] Other (See Comments), Hypertension   Increased blood pressure   Penicillins Hives   Vancomycin  Other (See Comments)   Red man syndrome - Mild redness and discomfort. Able to complete initial dose.    Fluticasone  Other (See Comments)   Reaction type/severity unknown   Other    SEASONAL ALLERGIES   Timolol Other (See Comments)   Reaction type/severity unknown        Medication List     TAKE these medications    albuterol  108 (90 Base) MCG/ACT inhaler Commonly known as: VENTOLIN  HFA Inhale 2 puffs into the lungs every 6 (six) hours as needed for wheezing or shortness of breath.   amLODipine  5 MG tablet Commonly known as: NORVASC  Take 1 tablet (5 mg total) by mouth daily. Start taking on: July 28, 2023   Azelastine  HCl 137 MCG/SPRAY Soln USE 1 TO 2 SPRAYS INTO BOTH NOSTRILS TWICE DAILY.   Calcium  600-10 MG-MCG Chew Chew 1 capsule by mouth daily.   dorzolamide  2 % ophthalmic solution Commonly known as: TRUSOPT  Place 1 drop into both eyes 2 (two) times daily.   famotidine  40 MG tablet Commonly known as: Pepcid  Take 1 tablet (40 mg total) by mouth at bedtime.   fexofenadine  180  MG tablet Commonly known as: ALLEGRA  Take 180 mg by mouth daily.   fluticasone  50 MCG/ACT nasal spray Commonly known as: FLONASE  USE 2 SPRAYS EACH NOSTRIL ONCE A DAY AS NEEDED FOR ALLERGIES OR CONGESTION.   gabapentin  100 MG capsule Commonly known as: NEURONTIN  Take 200 mg by mouth at bedtime.   guaiFENesin  600 MG 12 hr tablet Commonly known as: MUCINEX  Take 600 mg by mouth daily. And prn   ipratropium-albuterol  0.5-2.5 (3) MG/3ML Soln Commonly known as: DUONEB Take 3 mLs by nebulization at  bedtime.   ipratropium-albuterol  0.5-2.5 (3) MG/3ML Soln Commonly known as: DUONEB Take 3 mLs by nebulization every 6 (six) hours as needed.   latanoprost  0.005 % ophthalmic solution Commonly known as: XALATAN  Place 1 drop into both eyes at bedtime.   levothyroxine  75 MCG tablet Commonly known as: SYNTHROID  Take 1 tablet (75 mcg total) by mouth daily.   losartan  100 MG tablet Commonly known as: COZAAR  Take 1 tablet (100 mg total) by mouth daily. Start taking on: July 28, 2023 What changed:  medication strength how much to take   Lutein  20 MG Caps Take 1 capsule (20 mg total) by mouth daily.   Magnesium  500 MG Tabs Take 750 mg by mouth See admin instructions. Take 750 mg by mouth every evening (in conjunction with 3 BioComplete capsules)   metoprolol  tartrate 50 MG tablet Commonly known as: LOPRESSOR  Take 1.5 tablets (75 mg total) by mouth in the morning.   mometasone -formoterol  100-5 MCG/ACT Aero Commonly known as: DULERA  Inhale 2 puffs then rinse mouth, twice daily   montelukast  10 MG tablet Commonly known as: SINGULAIR  Take 1 tablet (10 mg total) by mouth daily.   NON FORMULARY Take 3 capsules by mouth See admin instructions. Gundry MD Bio Complete 3 - Prebiotic, Probiotic, Postbiotic to Support Optimal Gut Health capsules- TAKE 3 CAPSULES BY MOUTH EVERY EVENING (in conjunction with 750 mg Magnesium )   potassium chloride  10 MEQ tablet Commonly known as: KLOR-CON  Take 2 tablets (20 mEq total) by mouth daily. What changed:  how much to take when to take this additional instructions   predniSONE  5 MG tablet Commonly known as: DELTASONE  Take 0.5 tablets (2.5 mg total) by mouth daily with breakfast. What changed: Another medication with the same name was removed. Continue taking this medication, and follow the directions you see here.   PreserVision AREDS 2 Caps Take 1 capsule by mouth in the morning and at bedtime.   Rivaroxaban  15 MG Tabs tablet Commonly  known as: XARELTO  Take 15 mg by mouth daily.   torsemide  20 MG tablet Commonly known as: DEMADEX  Take 1 tablet (20 mg total) by mouth daily as needed. 3 lb weight gain in 1 day or 5 lb in 1 week or excessive edema   torsemide  20 MG tablet Commonly known as: DEMADEX  Take 1.5 tablets (30 mg total) by mouth daily.   TYLENOL  500 MG tablet Generic drug: acetaminophen  Take 1,000 mg by mouth in the morning, at noon, and at bedtime.   Vitamin D  125 MCG (5000 UT) Caps Take 1 capsule by mouth daily.        Discharge Exam: Filed Weights   07/21/23 1400  Weight: 66.1 kg   General-appears in no acute distress Heart-S1-S2, regular, no murmur auscultated Lungs-clear to auscultation bilaterally, no wheezing or crackles auscultated Abdomen-soft, nontender, no organomegaly Extremities-no edema in the lower extremities Neuro-alert, oriented x3, no focal deficit noted  Condition at discharge: good  The results of significant diagnostics from  this hospitalization (including imaging, microbiology, ancillary and laboratory) are listed below for reference.   Imaging Studies: DG Chest Port 1 View Result Date: 07/21/2023 CLINICAL DATA:  Shortness of breath with wheezing and productive cough. EXAM: PORTABLE CHEST 1 VIEW COMPARISON:  Radiographs 06/24/2022 and 04/08/2022.  CT 07/07/2022. FINDINGS: 0805 hours. Left subclavian pacemaker leads appear unchanged, projecting over the right atrium and right ventricle. Stable mild cardiac enlargement and aortic atherosclerosis. There are lower lung volumes with new focal perihilar airspace opacities bilaterally. There is chronic blunting of the left costophrenic angle with a possible small superimposed left pleural effusion. No evidence of pneumothorax. No acute osseous findings are demonstrated. Stable asymmetric glenohumeral degenerative changes on the right and a thoracolumbar scoliosis. IMPRESSION: New focal perihilar airspace opacities bilaterally,  suspicious for pneumonia. Possible small left pleural effusion. Followup PA and lateral chest X-ray is recommended in 4-6 weeks following appropriate therapy to ensure resolution and exclude underlying malignancy. Electronically Signed   By: Elmon Hagedorn M.D.   On: 07/21/2023 08:21    Microbiology: Results for orders placed or performed during the hospital encounter of 07/21/23  Resp panel by RT-PCR (RSV, Flu A&B, Covid) Anterior Nasal Swab     Status: None   Collection Time: 07/21/23  9:24 AM   Specimen: Anterior Nasal Swab  Result Value Ref Range Status   SARS Coronavirus 2 by RT PCR NEGATIVE NEGATIVE Final    Comment: (NOTE) SARS-CoV-2 target nucleic acids are NOT DETECTED.  The SARS-CoV-2 RNA is generally detectable in upper respiratory specimens during the acute phase of infection. The lowest concentration of SARS-CoV-2 viral copies this assay can detect is 138 copies/mL. A negative result does not preclude SARS-Cov-2 infection and should not be used as the sole basis for treatment or other patient management decisions. A negative result may occur with  improper specimen collection/handling, submission of specimen other than nasopharyngeal swab, presence of viral mutation(s) within the areas targeted by this assay, and inadequate number of viral copies(<138 copies/mL). A negative result must be combined with clinical observations, patient history, and epidemiological information. The expected result is Negative.  Fact Sheet for Patients:  BloggerCourse.com  Fact Sheet for Healthcare Providers:  SeriousBroker.it  This test is no t yet approved or cleared by the [place]  FDA and  has been authorized for detection and/or diagnosis of SARS-CoV-2 by FDA under an Emergency Use Authorization (EUA). This EUA will remain  in effect (meaning this test can be used) for the duration of the COVID-19 declaration under Section  564(b)(1) of the Act, 21 U.S.C.section 360bbb-3(b)(1), unless the authorization is terminated  or revoked sooner.       Influenza A by PCR NEGATIVE NEGATIVE Final   Influenza B by PCR NEGATIVE NEGATIVE Final    Comment: (NOTE) The Xpert Xpress SARS-CoV-2/FLU/RSV plus assay is intended as an aid in the diagnosis of influenza from Nasopharyngeal swab specimens and should not be used as a sole basis for treatment. Nasal washings and aspirates are unacceptable for Xpert Xpress SARS-CoV-2/FLU/RSV testing.  Fact Sheet for Patients: BloggerCourse.com  Fact Sheet for Healthcare Providers: SeriousBroker.it  This test is not yet approved or cleared by the [place]  FDA and has been authorized for detection and/or diagnosis of SARS-CoV-2 by FDA under an Emergency Use Authorization (EUA). This EUA will remain in effect (meaning this test can be used) for the duration of the COVID-19 declaration under Section 564(b)(1) of the Act, 21 U.S.C. section 360bbb-3(b)(1), unless the authorization is terminated or revoked.  Resp Syncytial Virus by PCR NEGATIVE NEGATIVE Final    Comment: (NOTE) Fact Sheet for Patients: BloggerCourse.com  Fact Sheet for Healthcare Providers: SeriousBroker.it  This test is not yet approved or cleared by the [place]  FDA and has been authorized for detection and/or diagnosis of SARS-CoV-2 by FDA under an Emergency Use Authorization (EUA). This EUA will remain in effect (meaning this test can be used) for the duration of the COVID-19 declaration under Section 564(b)(1) of the Act, 21 U.S.C. section 360bbb-3(b)(1), unless the authorization is terminated or revoked.  Performed at [facility], [address], [place], [place] [address]   Blood culture (routine x 2)     Status: None   Collection Time: 07/21/23  9:24 AM   Specimen:  BLOOD  Result Value Ref Range Status   Specimen Description   Final    BLOOD RIGHT ANTECUBITAL Performed at [facility], [address] [place], [place] [address]    Special Requests   Final    BOTTLES DRAWN AEROBIC AND ANAEROBIC Blood Culture adequate volume Performed at [facility], [address], [place], [place] [address]    Culture   Final    NO GROWTH 5 DAYS Performed at [facility], [address], [place], [place] [address]    Report Status 07/26/2023 FINAL  Final  Blood culture (routine x 2)     Status: None   Collection Time: 07/21/23  9:56 AM   Specimen: BLOOD  Result Value Ref Range Status   Specimen Description   Final    BLOOD LEFT ANTECUBITAL Performed at [facility], [address], [place], [place] [address]    Special Requests   Final    BOTTLES DRAWN AEROBIC AND ANAEROBIC Blood Culture results may not be optimal due to an inadequate volume of blood received in culture bottles Performed at [facility], [address], [place], [place] [address]    Culture   Final    NO GROWTH 5 DAYS Performed at [facility], [address], [place], [place] [address]    Report Status 07/26/2023 FINAL  Final    Labs: CBC: Recent Labs  Lab 07/23/23 0411 07/24/23 0435 07/25/23 0413 07/26/23 0410 07/27/23 0329  WBC 14.0* 11.5* 12.9* 11.4* 13.3*  HGB 10.5* 10.2* 11.5* 10.4* 10.3*  HCT 36.0 33.7* 39.7 34.4* 34.3*  MCV 102.6* 99.1 102.1* 98.9 99.1  PLT 345 338 399 434* 404*   Basic Metabolic Panel: Recent Labs  Lab 07/23/23 0411 07/24/23 0435 07/25/23 0413 07/26/23 0410 07/27/23 0329  NA 141 143 141 141 141  K 3.4* 3.6 4.1 3.5 3.5  CL 104 105 102 103 102  CO2 26 28 27 29 28   GLUCOSE 179* 172* 187* 175* 185*  BUN 36* 38* 38* 38* 40*  CREATININE 1.08* 1.05* 0.91 1.00 1.07*  CALCIUM  9.4 8.8* 9.2 9.1 8.6*  MG 2.5* 2.3 2.6* 2.7* 2.6*  PHOS 2.1*  --   --   --   --     Liver Function Tests: Recent Labs  Lab 07/21/23 0924  AST 20  ALT 13  ALKPHOS 64  BILITOT 0.8  PROT 7.4  ALBUMIN 3.5   CBG: No results for input(s): "GLUCAP" in the last 168 hours.  Discharge time spent: greater than 30 minutes.  Signed: Ozell Blunt, MD [facility] 07/27/2023

## 2023-07-28 ENCOUNTER — Telehealth: Payer: Self-pay

## 2023-07-28 NOTE — Transitions of Care (Post Inpatient/ED Visit) (Signed)
   07/28/2023  Name: Jody Blake MRN: [id number] DOB: [birth year]  Today's TOC FU Call Status: Today's TOC FU Call Status:: Unsuccessful Call (1st Attempt) Unsuccessful Call (1st Attempt) Date: 07/28/23  Attempted to reach the patient regarding the most recent Inpatient/ED visit.  Follow Up Plan: Additional outreach attempts will be made to reach the patient to complete the Transitions of Care (Post Inpatient/ED visit) call.   Tonia Frankel RN, CCM [place]  VBCI-Population Health RN Care Manager [phone]

## 2023-07-28 NOTE — Transitions of Care (Post Inpatient/ED Visit) (Signed)
   07/28/2023  Name: Jody Blake MRN: [id number] DOB: Mar 01, 1926  Today's TOC FU Call Status: Today's TOC FU Call Status:: Unsuccessful Call (2nd Attempt) Unsuccessful Call (2nd Attempt) Date: 07/28/23  Attempted to reach the patient regarding the most recent Inpatient/ED visit.  Follow Up Plan: Additional outreach attempts will be made to reach the patient to complete the Transitions of Care (Post Inpatient/ED visit) call.   Tonia Frankel RN, CCM [place]  VBCI-Population Health RN Care Manager [phone]

## 2023-07-29 ENCOUNTER — Encounter: Payer: Self-pay | Admitting: Orthopedic Surgery

## 2023-07-29 ENCOUNTER — Ambulatory Visit: Admitting: Orthopedic Surgery

## 2023-07-29 ENCOUNTER — Telehealth: Payer: Self-pay

## 2023-07-29 VITALS — BP 130/78 | HR 60 | Temp 97.3°F | Resp 20 | Ht 63.0 in | Wt 145.0 lb

## 2023-07-29 DIAGNOSIS — J189 Pneumonia, unspecified organism: Secondary | ICD-10-CM

## 2023-07-29 DIAGNOSIS — I10 Essential (primary) hypertension: Secondary | ICD-10-CM

## 2023-07-29 MED ORDER — LOSARTAN POTASSIUM 100 MG PO TABS: 50.0000 mg | ORAL_TABLET | Freq: Two times a day (BID) | ORAL | 2 refills | Status: DC

## 2023-07-29 NOTE — Patient Instructions (Signed)
 Discontinue amlodipine   Cut losartan  to 50 mg (1/2 tablet twice daily)

## 2023-07-29 NOTE — Transitions of Care (Post Inpatient/ED Visit) (Signed)
   07/29/2023  Name: Jody Blake MRN: [id number] DOB: February 27, 1926  Today's TOC FU Call Status: Today's TOC FU Call Status:: Successful TOC FU Call Completed TOC FU Call Complete Date: 07/29/23 (Inbound call from caregiver, Corbin Dess with patient present and stating she is doing well and did not feel she needed [facility] program) Patient's Name and Date of Birth confirmed.  TOC RN received inbound call from patient's personal care giver and patient. Caregiver, Corbin Dess states patient is on the independent side at [facility] and has 24 hour caregivers. Caregiver states the facility nurse checked on patient and medications from discharge summary were reviewed and patient denied need to review with [facility] RN. Caregiver states if they have any questions or concerns they will call. Patient not enrolled in [facility] program      Tonia Frankel RN, CCM [facility]  VBCI-Population Health RN Care Manager [phone]

## 2023-07-29 NOTE — Progress Notes (Signed)
 Location:   [facility]   Place of Service:   [facility] Provider:  Arnetha Bhat, NP   Marguerite Shiley, MD  Patient Care Team: Marguerite Shiley, MD as PCP - General (Internal Medicine) Hugh Madura, MD as PCP - Cardiology (Cardiology) Jolly Needle, MD (Inactive) as Consulting Physician (Cardiology) Faustina Hood, MD as Consulting Physician (Pulmonary Disease) Albert Huff, MD as Consulting Physician (Ophthalmology) Garrett Kallman, MD as Consulting Physician (Gastroenterology) Liliane Rei, MD as Consulting Physician (Orthopedic Surgery)  Extended Emergency Contact Information Primary Emergency Contact: Hunt,Cheryl  [place] Home Phone: [phone] Mobile Phone: [phone] Relation: Daughter Secondary Emergency Contact: Athena Bland  [place] Home Phone: [phone] Mobile Phone: [phone] Relation: Son  Code Status:  DNR Goals of care: Advanced Directive information    07/21/2023    3:00 PM  Advanced Directives  Does Patient Have a Medical Advance Directive? Yes  Type of Estate agent of [place];Living will  Does patient want to make changes to medical advance directive? No - Patient declined  Copy of Healthcare Power of Attorney in Chart? Yes - validated most recent copy scanned in chart (See row information)     Chief Complaint  Patient presents with   Hospitalization Follow-up    HPI:  Pt is a 89 y.o. female seen today for acute visit due to low blood pressure.   [place] currently resides in [place] at [place]. PMH: HTN, DVT, chronic diastolic CHF, CHB, PAF, SSS, TIA, COPD, GERD, hypothyroidism, OA, CKD III, macular degeneration, glaucoma, legally blind and unstable gait.   Caregiver present during encounter.   Hospitalized 04/14-04/20 due to CAP. She was given Rocephin , azithromycin  and condition improved. Blood pressures were elevated during hospitalization and she was started on  amlodipine  5 mg and losartan  increased to 100 mg. She was discharged back to [place] at [facility].   According to caregiver she started amlodipine  today. About an hour after ingesting medication, she had increased drowsiness. Caregiver rechecked BP and it was 102/55. She rechecked BP about 30 minutes later and it was 105/63. 1 hour after BP was 109/61. During this time she also has a large BM. [place] nurse advised scheduling in clinic today to be seen. Since hospital return appetite has been good. She is also drinking fluids well. No recent falls. She was easily aroused to voice at the beginning of encounter. Towards the end she was alert and asking questions. Afebrile. Vitals stable at this time.   Due to advanced age and chronic conditions. Discussed improved access to care in [place] setting. Caregiver will mention to family.   Scheduled 04/29 with Dr. Venice Gillis.   Past Medical History:  Diagnosis Date   Anemia    Aortic stenosis    a. Echo 09/06/12 EF 55-60%, no WMA, G2DD, Ao valve sclerosis w/ mod stenosis, LA mildly dilated, PA pressure   Asthmatic bronchitis    Complete heart block (HCC)    s/p permanent pacemaker 06/27/1999 (Battery change 06/2007 and 2016).  s/p AV nodal ablation by Dr Nunzio Belch 2016.   COPD (chronic obstructive pulmonary disease) (HCC)    Dr. Linder Revere   DDD (degenerative disc disease)    Diastolic dysfunction    a. Echo 09/06/12 EF 55-60%, no WMA, G2DD, Ao valve sclerosis w/ mod stenosis, LA mildly dilated, PA pressure   Diverticulosis    DVT (deep vein thrombosis) in pregnancy 1954   a. LLE   Hypertension    Hypothyroidism  on medication   Macular degeneration    Dr. Alean Hunt   Osteoarthrosis, unspecified whether generalized or localized, other specified sites    PAF (paroxysmal atrial fibrillation) (HCC)    Stopped flecainide , on amiodarone but still has bouts of A FIB (mostly in mornings).    Pure hypercholesterolemia    Staghorn calculus    Left   Past Surgical  History:  Procedure Laterality Date   APPENDECTOMY  ~ 1941   AV NODE ABLATION  05/10/2014   AV NODE ABLATION N/A 05/10/2014   Procedure: AV NODE ABLATION;  Surgeon: Jolly Needle, MD;  Location: [facility] CATH LAB;  Service: Cardiovascular;  Laterality: N/A;   BUNIONECTOMY WITH HAMMERTOE RECONSTRUCTION Bilateral ~ 1990   CARDIAC PACEMAKER PLACEMENT  06/27/99   Medtronic PM implanted by Dr Dortha Gauss   CARDIOVERSION N/A 12/02/2013   Procedure: CARDIOVERSION;  Surgeon: Elmyra Haggard, MD;  Location: [facility] ENDOSCOPY;  Service: Cardiovascular;  Laterality: N/A;   CATARACT EXTRACTION W/ INTRAOCULAR LENS  IMPLANT, BILATERAL Bilateral    COLONOSCOPY WITH PROPOFOL  N/A 04/22/2013   Procedure: COLONOSCOPY WITH PROPOFOL ;  Surgeon: Garrett Kallman, MD;  Location: [facility] ENDOSCOPY;  Service: Endoscopy;  Laterality: N/A;   DILATION AND CURETTAGE OF UTERUS  X 2   "when I was going thru menopause"   ESOPHAGOGASTRODUODENOSCOPY (EGD) WITH PROPOFOL  N/A 04/22/2013   Procedure: ESOPHAGOGASTRODUODENOSCOPY (EGD) WITH PROPOFOL ;  Surgeon: Garrett Kallman, MD;  Location: [facility] ENDOSCOPY;  Service: Endoscopy;  Laterality: N/A;   HERNIA REPAIR     INCISIONAL HERNIA REPAIR     INSERT / REPLACE / REMOVE PACEMAKER  06/2007   "took out the old; put in new"   INSERT / REPLACE / REMOVE PACEMAKER  05/10/2014   MDT PPM generator change by Dr Nunzio Belch   JOINT REPLACEMENT     PARTIAL NEPHRECTOMY Left 05/1974   stone disease   PERMANENT PACEMAKER GENERATOR CHANGE N/A 05/10/2014   Procedure: PERMANENT PACEMAKER GENERATOR CHANGE;  Surgeon: Jolly Needle, MD;  Location: [facility] CATH LAB;  Service: Cardiovascular;  Laterality: N/A;   TONSILLECTOMY AND ADENOIDECTOMY  1930's   TOTAL KNEE ARTHROPLASTY Right 2001    Allergies  Allergen Reactions   Combigan [Brimonidine Tartrate-Timolol] Other (See Comments)    Systemic malaise amigen eye drop   Tape Other (See Comments)    SKIN IS VERY FRAGILE!!   Breo Ellipta  [Fluticasone  Furoate-Vilanterol] Other (See Comments)  and Hypertension    Increased blood pressure   Penicillins Hives   Vancomycin  Other (See Comments)    Red man syndrome - Mild redness and discomfort. Able to complete initial dose.    Fluticasone  Other (See Comments)    Reaction type/severity unknown   Other     SEASONAL ALLERGIES   Timolol Other (See Comments)    Reaction type/severity unknown     Outpatient Encounter Medications as of 07/29/2023  Medication Sig   albuterol  (VENTOLIN  HFA) 108 (90 Base) MCG/ACT inhaler Inhale 2 puffs into the lungs every 6 (six) hours as needed for wheezing or shortness of breath.   amLODipine  (NORVASC ) 5 MG tablet Take 1 tablet (5 mg total) by mouth daily.   Azelastine  HCl 137 MCG/SPRAY SOLN USE 1 TO 2 SPRAYS INTO BOTH NOSTRILS TWICE DAILY.   Calcium  600-10 MG-MCG CHEW Chew 1 capsule by mouth daily.   Cholecalciferol  (VITAMIN D ) 125 MCG (5000 UT) CAPS Take 1 capsule by mouth daily.   dorzolamide  (TRUSOPT ) 2 % ophthalmic solution Place 1 drop into both eyes 2 (two) times daily.  famotidine  (PEPCID ) 40 MG tablet Take 1 tablet (40 mg total) by mouth at bedtime.   fexofenadine  (ALLEGRA ) 180 MG tablet Take 180 mg by mouth daily.   fluticasone  (FLONASE ) 50 MCG/ACT nasal spray USE 2 SPRAYS EACH NOSTRIL ONCE A DAY AS NEEDED FOR ALLERGIES OR CONGESTION.   gabapentin  (NEURONTIN ) 100 MG capsule Take 200 mg by mouth at bedtime.   guaiFENesin  (MUCINEX ) 600 MG 12 hr tablet Take 600 mg by mouth daily. And prn   ipratropium-albuterol  (DUONEB) 0.5-2.5 (3) MG/3ML SOLN Take 3 mLs by nebulization at bedtime.   ipratropium-albuterol  (DUONEB) 0.5-2.5 (3) MG/3ML SOLN Take 3 mLs by nebulization every 6 (six) hours as needed.   latanoprost  (XALATAN ) 0.005 % ophthalmic solution Place 1 drop into both eyes at bedtime.   levothyroxine  (SYNTHROID ) 75 MCG tablet Take 1 tablet (75 mcg total) by mouth daily.   losartan  (COZAAR ) 100 MG tablet Take 1 tablet (100 mg total) by mouth daily.   Lutein  20 MG CAPS Take 1 capsule (20 mg  total) by mouth daily.   Magnesium  500 MG TABS Take 750 mg by mouth See admin instructions. Take 750 mg by mouth every evening (in conjunction with 3 BioComplete capsules)   metoprolol  tartrate (LOPRESSOR ) 50 MG tablet Take 1.5 tablets (75 mg total) by mouth in the morning.   mometasone -formoterol  (DULERA ) 100-5 MCG/ACT AERO Inhale 2 puffs then rinse mouth, twice daily   montelukast  (SINGULAIR ) 10 MG tablet Take 1 tablet (10 mg total) by mouth daily.   Multiple Vitamins-Minerals (PRESERVISION AREDS 2) CAPS Take 1 capsule by mouth in the morning and at bedtime.   NON FORMULARY Take 3 capsules by mouth See admin instructions. Gundry MD Bio Complete 3 - Prebiotic, Probiotic, Postbiotic to Support Optimal Gut Health capsules- TAKE 3 CAPSULES BY MOUTH EVERY EVENING (in conjunction with 750 mg Magnesium )   potassium chloride  (KLOR-CON ) 10 MEQ tablet Take 2 tablets (20 mEq total) by mouth daily. (Patient taking differently: Take 10-20 mEq by mouth 2 (two) times daily. Take 20mEq (2 tablets) in the morning and 10mEq (1 tablet) with lunch for a total daily dose of 30mEq)   predniSONE  (DELTASONE ) 5 MG tablet Take 0.5 tablets (2.5 mg total) by mouth daily with breakfast.   Rivaroxaban  (XARELTO ) 15 MG TABS tablet Take 15 mg by mouth daily.   torsemide  (DEMADEX ) 20 MG tablet Take 1 tablet (20 mg total) by mouth daily as needed. 3 lb weight gain in 1 day or 5 lb in 1 week or excessive edema   torsemide  (DEMADEX ) 20 MG tablet Take 1.5 tablets (30 mg total) by mouth daily.   TYLENOL  500 MG tablet Take 1,000 mg by mouth in the morning, at noon, and at bedtime.   No facility-administered encounter medications on file as of 07/29/2023.    Review of Systems  Unable to perform ROS: Age (drowsiness)    Immunization History  Administered Date(s) Administered   Fluad Trivalent(High Dose 65+) 01/09/2023   Influenza Split 01/07/2011   Influenza Whole 01/16/2009, 01/23/2010   Influenza, High Dose Seasonal PF  01/06/2013, 01/15/2019, 02/04/2020   Influenza,inj,Quad PF,6+ Mos 02/17/2015, 01/30/2018, 01/07/2022   Influenza,inj,quad, With Preservative 01/06/2017   Influenza-Unspecified 01/06/2014, 02/01/2016, 02/04/2020, 01/26/2021   Moderna Covid-19 Vaccine  Bivalent Booster 55yrs & up 08/09/2021, 01/14/2023   Moderna SARS-COV2 Booster Vaccination 08/18/2020   Moderna Sars-Covid-2 Vaccination 04/20/2019, 05/18/2019, 02/22/2020   Pfizer(Comirnaty )Fall Seasonal Vaccine 12 years and older 01/28/2022   Pneumococcal Conjugate-13 02/24/2019   Pneumococcal Polysaccharide-23 11/23/2013   Respiratory Syncytial Virus  Vaccine,Recomb Aduvanted(Arexvy ) 01/07/2022   Tdap 02/24/2019   Zoster Recombinant(Shingrix) 03/13/2017, 05/20/2017   Pertinent  Health Maintenance Due  Topic Date Due   INFLUENZA VACCINE  11/07/2023   DEXA SCAN  Discontinued      01/07/2023   10:53 AM 02/25/2023   11:08 AM 04/14/2023    1:24 PM 05/12/2023    2:15 PM 06/30/2023    1:27 PM  Fall Risk  Falls in the past year? 0 1 0 0 0  Was there an injury with Fall? 0 1 0 0 0  Fall Risk Category Calculator 0 3 0 0 0  Patient at Risk for Falls Due to No Fall Risks Impaired balance/gait;Impaired mobility;History of fall(s)  No Fall Risks No Fall Risks  Fall risk Follow up Falls evaluation completed Falls evaluation completed  Falls evaluation completed Falls evaluation completed   Functional Status Survey:    Vitals:   07/29/23 1304  BP: 130/78  Pulse: 60  Resp: 20  Temp: (!) 97.3 F (36.3 C)  SpO2: 96%  Height: 5\' 3"  (1.6 m)   Body mass index is 25.83 kg/m. Physical Exam Vitals reviewed.  Eyes:     General:        Right eye: No discharge.        Left eye: No discharge.     Pupils: Pupils are equal, round, and reactive to light.  Cardiovascular:     Rate and Rhythm: Normal rate. Rhythm irregular.     Pulses: Normal pulses.     Heart sounds: Normal heart sounds.  Pulmonary:     Effort: Pulmonary effort is normal. No  respiratory distress.     Breath sounds: Examination of the right-upper field reveals wheezing. Examination of the left-upper field reveals wheezing. Wheezing present. No rhonchi or rales.  Abdominal:     General: Bowel sounds are normal.     Palpations: Abdomen is soft.  Musculoskeletal:     Cervical back: Neck supple.     Right lower leg: No edema.     Left lower leg: No edema.  Skin:    General: Skin is warm.     Capillary Refill: Capillary refill takes less than 2 seconds.  Neurological:     General: No focal deficit present.     Mental Status: She is easily aroused. Mental status is at baseline.     Motor: Weakness present.     Gait: Gait abnormal.  Psychiatric:        Mood and Affect: Mood normal.     Comments: Follows commands, able to answer simple questions     Labs reviewed: Recent Labs    07/23/23 0411 07/24/23 0435 07/25/23 0413 07/26/23 0410 07/27/23 0329  NA 141   < > 141 141 141  K 3.4*   < > 4.1 3.5 3.5  CL 104   < > 102 103 102  CO2 26   < > 27 29 28   GLUCOSE 179*   < > 187* 175* 185*  BUN 36*   < > 38* 38* 40*  CREATININE 1.08*   < > 0.91 1.00 1.07*  CALCIUM  9.4   < > 9.2 9.1 8.6*  MG 2.5*   < > 2.6* 2.7* 2.6*  PHOS 2.1*  --   --   --   --    < > = values in this interval not displayed.   Recent Labs    07/21/23 0924  AST 20  ALT 13  ALKPHOS 64  BILITOT  0.8  PROT 7.4  ALBUMIN 3.5   Recent Labs    05/12/23 1435 07/21/23 0924 07/25/23 0413 07/26/23 0410 07/27/23 0329  WBC 11.2*   < > 12.9* 11.4* 13.3*  NEUTROABS 9,733*  --   --   --   --   HGB 12.2   < > 11.5* 10.4* 10.3*  HCT 37.9   < > 39.7 34.4* 34.3*  MCV 95.0   < > 102.1* 98.9 99.1  PLT 432*   < > 399 434* 404*   < > = values in this interval not displayed.   Lab Results  Component Value Date   TSH 2.30 12/31/2022   Lab Results  Component Value Date   HGBA1C 5.7 (H) 06/25/2017   Lab Results  Component Value Date   CHOL 138 11/04/2017   HDL 70 11/04/2017   LDLCALC 58  11/04/2017   TRIG 53 11/04/2017   CHOLHDL 3.1 06/25/2017    Significant Diagnostic Results in last 30 days:  DG Chest Port 1 View Result Date: 07/21/2023 CLINICAL DATA:  Shortness of breath with wheezing and productive cough. EXAM: PORTABLE CHEST 1 VIEW COMPARISON:  Radiographs 06/24/2022 and 04/08/2022.  CT 07/07/2022. FINDINGS: 0805 hours. Left subclavian pacemaker leads appear unchanged, projecting over the right atrium and right ventricle. Stable mild cardiac enlargement and aortic atherosclerosis. There are lower lung volumes with new focal perihilar airspace opacities bilaterally. There is chronic blunting of the left costophrenic angle with a possible small superimposed left pleural effusion. No evidence of pneumothorax. No acute osseous findings are demonstrated. Stable asymmetric glenohumeral degenerative changes on the right and a thoracolumbar scoliosis. IMPRESSION: New focal perihilar airspace opacities bilaterally, suspicious for pneumonia. Possible small left pleural effusion. Followup PA and lateral chest X-ray is recommended in 4-6 weeks following appropriate therapy to ensure resolution and exclude underlying malignancy. Electronically Signed   By: Elmon Hagedorn M.D.   On: 07/21/2023 08:21    Assessment/Plan 1. Essential hypertension (Primary) - uncontrolled> SBP averaging < 110 - increased drowsiness 1 hour after amlodipine  - discontinue amlodipine  - change losartan  to 50 mg po BID - continue blood pressures BID before giving antihypertensive> bring readings to Dr. Venice Gillis next week - losartan  (COZAAR ) 100 MG tablet; Take 0.5 tablets (50 mg total) by mouth 2 (two) times daily.  Dispense: 30 tablet; Refill: 2  2. Community acquired pneumonia of left lower lobe of lung - hospitalized 04/14-04/20 - completed Rocephin  and azithromycin  - exp wheezing to upper lobes otherwise clear - cont incentive spirometer    Family/ staff Communication: plan discussed with patient,  caregiver and nurse  Labs/tests ordered:  none

## 2023-07-31 DIAGNOSIS — I951 Orthostatic hypotension: Secondary | ICD-10-CM | POA: Diagnosis not present

## 2023-08-01 ENCOUNTER — Encounter: Payer: Self-pay | Admitting: Adult Health

## 2023-08-01 ENCOUNTER — Non-Acute Institutional Stay (SKILLED_NURSING_FACILITY): Payer: Self-pay | Admitting: Adult Health

## 2023-08-01 DIAGNOSIS — N1831 Chronic kidney disease, stage 3a: Secondary | ICD-10-CM | POA: Diagnosis not present

## 2023-08-01 DIAGNOSIS — I825Y2 Chronic embolism and thrombosis of unspecified deep veins of left proximal lower extremity: Secondary | ICD-10-CM

## 2023-08-01 DIAGNOSIS — I5032 Chronic diastolic (congestive) heart failure: Secondary | ICD-10-CM | POA: Diagnosis not present

## 2023-08-01 DIAGNOSIS — R531 Weakness: Secondary | ICD-10-CM | POA: Diagnosis not present

## 2023-08-01 DIAGNOSIS — H903 Sensorineural hearing loss, bilateral: Secondary | ICD-10-CM | POA: Diagnosis not present

## 2023-08-01 DIAGNOSIS — J410 Simple chronic bronchitis: Secondary | ICD-10-CM | POA: Diagnosis not present

## 2023-08-01 DIAGNOSIS — J441 Chronic obstructive pulmonary disease with (acute) exacerbation: Secondary | ICD-10-CM | POA: Diagnosis not present

## 2023-08-01 DIAGNOSIS — J189 Pneumonia, unspecified organism: Secondary | ICD-10-CM | POA: Diagnosis not present

## 2023-08-01 DIAGNOSIS — I10 Essential (primary) hypertension: Secondary | ICD-10-CM

## 2023-08-01 DIAGNOSIS — H401131 Primary open-angle glaucoma, bilateral, mild stage: Secondary | ICD-10-CM | POA: Diagnosis not present

## 2023-08-01 DIAGNOSIS — M6281 Muscle weakness (generalized): Secondary | ICD-10-CM | POA: Diagnosis not present

## 2023-08-01 DIAGNOSIS — Z8701 Personal history of pneumonia (recurrent): Secondary | ICD-10-CM | POA: Diagnosis not present

## 2023-08-01 MED ORDER — TORSEMIDE 20 MG PO TABS: 20.0000 mg | ORAL_TABLET | Freq: Every day | ORAL | Status: DC

## 2023-08-01 MED ORDER — LOSARTAN POTASSIUM 25 MG PO TABS: 25.0000 mg | ORAL_TABLET | Freq: Every day | ORAL | Status: DC

## 2023-08-01 MED ORDER — PREDNISONE 5 MG PO TABS: 5.0000 mg | ORAL_TABLET | Freq: Every day | ORAL | Status: DC

## 2023-08-01 NOTE — Progress Notes (Signed)
 Location:  Medical illustrator of Service:  SNF ([id number]) Provider:  Janace Mckusick, ANP [facility] [phone]  Code Status: DNR most form  Goals of Care:     07/21/2023    3:00 PM  Advanced Directives  Does Patient Have a Medical Advance Directive? Yes  Type of Estate agent of [place];Living will  Does patient want to make changes to medical advance directive? No - Patient declined  Copy of Healthcare Power of Attorney in Chart? Yes - validated most recent copy scanned in chart (See row information)     Chief Complaint  Patient presents with   Acute Visit    F/u hospitalization     HPI: Patient is a 89 y.o. female seen today for hospital follow-up s/p admission to the hospital.  PMH significant for pacemaker due to SSS, afib, HTN, chronic diastolic CHF, COPD, chronic bronchitis, GERD, OA, CKD, HOH, glaucoma, macular degeneration, blindness, constipation.   She was hospitalized from April 14th to April 20th, 2025, for community-acquired pneumonia and sepsis. During her hospitalization, she was treated with Rocephin , azithromycin , steroids, and bronchodilators. A chest x-ray on April 14th showed new focal perihilar airspace opacities bilaterally, suspicious for pneumonia, and a small left effusion. A CT scan on April 1st for a follow-up nodule showed no interstitial lung disease, no pulmonary nodules, a small left pleural effusion, pulmonary hypertension, and aortic atherosclerosis.  Post-hospitalization, she experienced weakness and low blood pressure. She was initially started on Norvasc  5 mg and her losartan  was increased to 100 mg. However, due to continued weakness and low blood pressure, Norvasc  was discontinued, and losartan  was adjusted to 50 mg twice a day. Despite these changes, she remained weak with borderline low blood pressure and mild confusion, leading to her admission to the skilled rehab area.  Her recent lab  work includes a CBC showing a white count of 17.5, hemoglobin of 12.1, and platelets of 459. A CMP showed a BUN of 51, creatinine of 1.3, potassium of 4.5, and sodium of 144. Her baseline BUN and creatinine are typically 38 and 1.0, respectively. Upon discharge from the hospital, her white count was 13.3.  She is on chronic steroids, having been discharged on 2.5 mg of prednisone , with a recommendation from her pulmonologist in January to increase to 5 mg through the winter and spring. No shortness of breath during the visit but had congestion this morning, which improved after using Duoneb. She has a chronic cough that is unchanged and is currently on room air with normal oxygen  saturation.  She has significant hearing loss and is blind. Her weight is stable with no increased edema. She has a DNR and a MOST form indicating no hospitalizations.  Seen for LLE pain and venous doppler ordered 05/12/23 on xarelto   RIGHT:  - No evidence of common femoral vein obstruction.  LEFT:  Findings consistent with age indeterminate intramuscular thrombus  involving the left gastrocnemius veins.   Has some chronic knee pain and has tried gel injections and steroid injections. Now with more limited ambulation.   Also she reports some difficulty urinating and low urine out put at home. She has been able to urinate in rehab.   Past Medical History:  Diagnosis Date   Anemia    Aortic stenosis    a. Echo 09/06/12 EF 55-60%, no WMA, G2DD, Ao valve sclerosis w/ mod stenosis, LA mildly dilated, PA pressure   Asthmatic bronchitis    Complete heart  block [facility])    s/p permanent pacemaker 06/27/1999 (Battery change 06/2007 and 2016).  s/p AV nodal ablation by Dr Nunzio Belch 2016.   COPD (chronic obstructive pulmonary disease) (HCC)    Dr. Linder Revere   DDD (degenerative disc disease)    Diastolic dysfunction    a. Echo 09/06/12 EF 55-60%, no WMA, G2DD, Ao valve sclerosis w/ mod stenosis, LA mildly dilated, PA pressure    Diverticulosis    DVT (deep vein thrombosis) in pregnancy 1954   a. LLE   Hypertension    Hypothyroidism    on medication   Macular degeneration    Dr. Alean Hunt   Osteoarthrosis, unspecified whether generalized or localized, other specified sites    PAF (paroxysmal atrial fibrillation) (HCC)    Stopped flecainide , on amiodarone but still has bouts of A FIB (mostly in mornings).    Pure hypercholesterolemia    Staghorn calculus    Left    Past Surgical History:  Procedure Laterality Date   APPENDECTOMY  ~ 1941   AV NODE ABLATION  05/10/2014   AV NODE ABLATION N/A 05/10/2014   Procedure: AV NODE ABLATION;  Surgeon: Jolly Needle, MD;  Location: [facility] CATH LAB;  Service: Cardiovascular;  Laterality: N/A;   BUNIONECTOMY WITH HAMMERTOE RECONSTRUCTION Bilateral ~ 1990   CARDIAC PACEMAKER PLACEMENT  06/27/99   Medtronic PM implanted by Dr Dortha Gauss   CARDIOVERSION N/A 12/02/2013   Procedure: CARDIOVERSION;  Surgeon: Elmyra Haggard, MD;  Location: [facility] ENDOSCOPY;  Service: Cardiovascular;  Laterality: N/A;   CATARACT EXTRACTION W/ INTRAOCULAR LENS  IMPLANT, BILATERAL Bilateral    COLONOSCOPY WITH PROPOFOL  N/A 04/22/2013   Procedure: COLONOSCOPY WITH PROPOFOL ;  Surgeon: Garrett Kallman, MD;  Location: [facility] ENDOSCOPY;  Service: Endoscopy;  Laterality: N/A;   DILATION AND CURETTAGE OF UTERUS  X 2   "when I was going thru menopause"   ESOPHAGOGASTRODUODENOSCOPY (EGD) WITH PROPOFOL  N/A 04/22/2013   Procedure: ESOPHAGOGASTRODUODENOSCOPY (EGD) WITH PROPOFOL ;  Surgeon: Garrett Kallman, MD;  Location: [facility] ENDOSCOPY;  Service: Endoscopy;  Laterality: N/A;   HERNIA REPAIR     INCISIONAL HERNIA REPAIR     INSERT / REPLACE / REMOVE PACEMAKER  06/2007   "took out the old; put in new"   INSERT / REPLACE / REMOVE PACEMAKER  05/10/2014   MDT PPM generator change by Dr Nunzio Belch   JOINT REPLACEMENT     PARTIAL NEPHRECTOMY Left 05/1974   stone disease   PERMANENT PACEMAKER GENERATOR CHANGE N/A 05/10/2014   Procedure: PERMANENT  PACEMAKER GENERATOR CHANGE;  Surgeon: Jolly Needle, MD;  Location: [facility] CATH LAB;  Service: Cardiovascular;  Laterality: N/A;   TONSILLECTOMY AND ADENOIDECTOMY  1930's   TOTAL KNEE ARTHROPLASTY Right 2001    Allergies  Allergen Reactions   Combigan [Brimonidine Tartrate-Timolol] Other (See Comments)    Systemic malaise amigen eye drop   Tape Other (See Comments)    SKIN IS VERY FRAGILE!!   Breo Ellipta  [Fluticasone  Furoate-Vilanterol] Other (See Comments) and Hypertension    Increased blood pressure   Penicillins Hives   Vancomycin  Other (See Comments)    Red man syndrome - Mild redness and discomfort. Able to complete initial dose.    Fluticasone  Other (See Comments)    Reaction type/severity unknown   Other     SEASONAL ALLERGIES   Timolol Other (See Comments)    Reaction type/severity unknown     Outpatient Encounter Medications as of 08/01/2023  Medication Sig   albuterol  (VENTOLIN  HFA) 108 (90 Base) MCG/ACT inhaler Inhale  2 puffs into the lungs every 6 (six) hours as needed for wheezing or shortness of breath.   Azelastine  HCl 137 MCG/SPRAY SOLN USE 1 TO 2 SPRAYS INTO BOTH NOSTRILS TWICE DAILY.   Calcium  600-10 MG-MCG CHEW Chew 1 capsule by mouth daily.   Cholecalciferol  (VITAMIN D ) 125 MCG (5000 UT) CAPS Take 1 capsule by mouth daily.   dorzolamide  (TRUSOPT ) 2 % ophthalmic solution Place 1 drop into both eyes 2 (two) times daily.   famotidine  (PEPCID ) 40 MG tablet Take 1 tablet (40 mg total) by mouth at bedtime.   fexofenadine  (ALLEGRA ) 180 MG tablet Take 180 mg by mouth daily.   fluticasone  (FLONASE ) 50 MCG/ACT nasal spray USE 2 SPRAYS EACH NOSTRIL ONCE A DAY AS NEEDED FOR ALLERGIES OR CONGESTION.   gabapentin  (NEURONTIN ) 100 MG capsule Take 200 mg by mouth at bedtime.   guaiFENesin  (MUCINEX ) 600 MG 12 hr tablet Take 600 mg by mouth daily. And prn   ipratropium-albuterol  (DUONEB) 0.5-2.5 (3) MG/3ML SOLN Take 3 mLs by nebulization at bedtime.   ipratropium-albuterol  (DUONEB)  0.5-2.5 (3) MG/3ML SOLN Take 3 mLs by nebulization every 6 (six) hours as needed.   latanoprost  (XALATAN ) 0.005 % ophthalmic solution Place 1 drop into both eyes at bedtime.   levothyroxine  (SYNTHROID ) 75 MCG tablet Take 1 tablet (75 mcg total) by mouth daily.   losartan  (COZAAR ) 100 MG tablet Take 0.5 tablets (50 mg total) by mouth 2 (two) times daily.   Lutein  20 MG CAPS Take 1 capsule (20 mg total) by mouth daily.   Magnesium  500 MG TABS Take 750 mg by mouth See admin instructions. Take 750 mg by mouth every evening (in conjunction with 3 BioComplete capsules)   metoprolol  tartrate (LOPRESSOR ) 50 MG tablet Take 1.5 tablets (75 mg total) by mouth in the morning.   mometasone -formoterol  (DULERA ) 100-5 MCG/ACT AERO Inhale 2 puffs then rinse mouth, twice daily   montelukast  (SINGULAIR ) 10 MG tablet Take 1 tablet (10 mg total) by mouth daily.   Multiple Vitamins-Minerals (PRESERVISION AREDS 2) CAPS Take 1 capsule by mouth in the morning and at bedtime.   NON FORMULARY Take 3 capsules by mouth See admin instructions. Gundry MD Bio Complete 3 - Prebiotic, Probiotic, Postbiotic to Support Optimal Gut Health capsules- TAKE 3 CAPSULES BY MOUTH EVERY EVENING (in conjunction with 750 mg Magnesium )   potassium chloride  (KLOR-CON ) 10 MEQ tablet Take 2 tablets (20 mEq total) by mouth daily. (Patient taking differently: Take 10-20 mEq by mouth 2 (two) times daily. Take 20mEq (2 tablets) in the morning and 10mEq (1 tablet) with lunch for a total daily dose of 30mEq)   predniSONE  (DELTASONE ) 5 MG tablet Take 0.5 tablets (2.5 mg total) by mouth daily with breakfast.   Rivaroxaban  (XARELTO ) 15 MG TABS tablet Take 15 mg by mouth daily.   torsemide  (DEMADEX ) 20 MG tablet Take 1 tablet (20 mg total) by mouth daily as needed. 3 lb weight gain in 1 day or 5 lb in 1 week or excessive edema   torsemide  (DEMADEX ) 20 MG tablet Take 1.5 tablets (30 mg total) by mouth daily.   TYLENOL  500 MG tablet Take 1,000 mg by mouth in  the morning, at noon, and at bedtime.   No facility-administered encounter medications on file as of 08/01/2023.    Review of Systems:  Review of Systems  Constitutional:  Positive for activity change and fatigue. Negative for appetite change, chills, diaphoresis, fever and unexpected weight change.  HENT:  Positive for congestion (chronic) and  hearing loss.   Eyes:  Positive for visual disturbance.  Respiratory:  Positive for cough (chronic). Negative for shortness of breath and wheezing.   Cardiovascular:  Positive for leg swelling. Negative for chest pain and palpitations.  Gastrointestinal:  Negative for abdominal distention, abdominal pain, constipation and diarrhea.  Genitourinary:  Negative for difficulty urinating and dysuria.  Musculoskeletal:  Positive for arthralgias and gait problem. Negative for back pain, joint swelling and myalgias.  Neurological:  Positive for weakness. Negative for dizziness, tremors, seizures, syncope, facial asymmetry, speech difficulty, light-headedness, numbness and headaches.  Psychiatric/Behavioral:  Negative for agitation and behavioral problems. Confusion: mild.    Health Maintenance  Topic Date Due   COVID-19 Vaccine (8 - 2024-25 season) 01/07/2024 (Originally 03/11/2023)   INFLUENZA VACCINE  11/07/2023   Medicare Annual Wellness (AWV)  06/29/2024   DTaP/Tdap/Td (2 - Td or Tdap) 02/23/2029   Pneumonia Vaccine 47+ Years old  Completed   Zoster Vaccines- Shingrix  Completed   HPV VACCINES  Aged Out   Meningococcal B Vaccine  Aged Out   DEXA SCAN  Discontinued    Physical Exam: Vitals:   08/01/23 1254  BP: 127/65  Pulse: 61  Resp: 16  Temp: 98.4 F (36.9 C)  SpO2: 96%  Weight: 132 lb 12.8 oz (60.2 kg)   Body mass index is 23.52 kg/m. Physical Exam Vitals and nursing note reviewed.  Constitutional:      General: She is not in acute distress.    Appearance: Normal appearance. She is not diaphoretic.  HENT:     Head: Normocephalic  and atraumatic.     Nose: No congestion.     Mouth/Throat:     Mouth: Mucous membranes are moist.     Comments: White drainage in OP area Neck:     Thyroid : No thyromegaly.     Vascular: No carotid bruit or JVD.  Cardiovascular:     Rate and Rhythm: Normal rate and regular rhythm.     Heart sounds: Murmur heard.  Pulmonary:     Effort: Pulmonary effort is normal. No respiratory distress.     Breath sounds: No stridor.     Comments: Decreased bases Abdominal:     General: Bowel sounds are normal. There is no distension.     Palpations: Abdomen is soft. There is no mass.     Tenderness: There is no abdominal tenderness. There is no right CVA tenderness or left CVA tenderness.  Musculoskeletal:        General: Normal range of motion.     Cervical back: No rigidity. No muscular tenderness.     Right lower leg: Edema (+1) present.     Left lower leg: Edema (+1) present.  Lymphadenopathy:     Cervical: No cervical adenopathy.  Skin:    General: Skin is warm and dry.     Findings: Bruising (BLE) present.  Neurological:     General: No focal deficit present.     Mental Status: She is alert. Mental status is at baseline.  Psychiatric:        Mood and Affect: Mood normal.     Labs reviewed: Basic Metabolic Panel: Recent Labs    12/31/22 0000 05/12/23 1435 07/23/23 0411 07/24/23 0435 07/25/23 0413 07/26/23 0410 07/27/23 0329  NA 143   < > 141   < > 141 141 141  K 3.7   < > 3.4*   < > 4.1 3.5 3.5  CL 103   < > 104   < >  102 103 102  CO2 29*   < > 26   < > 27 29 28   GLUCOSE  --    < > 179*   < > 187* 175* 185*  BUN 37*   < > 36*   < > 38* 38* 40*  CREATININE 1.3*   < > 1.08*   < > 0.91 1.00 1.07*  CALCIUM  10.0   < > 9.4   < > 9.2 9.1 8.6*  MG  --   --  2.5*   < > 2.6* 2.7* 2.6*  PHOS  --   --  2.1*  --   --   --   --   TSH 2.30  --   --   --   --   --   --    < > = values in this interval not displayed.   Liver Function Tests: Recent Labs    07/21/23 0924  AST 20   ALT 13  ALKPHOS 64  BILITOT 0.8  PROT 7.4  ALBUMIN 3.5   No results for input(s): "LIPASE", "AMYLASE" in the last 8760 hours. No results for input(s): "AMMONIA" in the last 8760 hours. CBC: Recent Labs    05/12/23 1435 07/21/23 0924 07/25/23 0413 07/26/23 0410 07/27/23 0329  WBC 11.2*   < > 12.9* 11.4* 13.3*  NEUTROABS 9,733*  --   --   --   --   HGB 12.2   < > 11.5* 10.4* 10.3*  HCT 37.9   < > 39.7 34.4* 34.3*  MCV 95.0   < > 102.1* 98.9 99.1  PLT 432*   < > 399 434* 404*   < > = values in this interval not displayed.   Lipid Panel: No results for input(s): "CHOL", "HDL", "LDLCALC", "TRIG", "CHOLHDL", "LDLDIRECT" in the last 8760 hours. Lab Results  Component Value Date   HGBA1C 5.7 (H) 06/25/2017    Procedures since last visit: DG Chest Port 1 View Result Date: 07/21/2023 CLINICAL DATA:  Shortness of breath with wheezing and productive cough. EXAM: PORTABLE CHEST 1 VIEW COMPARISON:  Radiographs 06/24/2022 and 04/08/2022.  CT 07/07/2022. FINDINGS: 0805 hours. Left subclavian pacemaker leads appear unchanged, projecting over the right atrium and right ventricle. Stable mild cardiac enlargement and aortic atherosclerosis. There are lower lung volumes with new focal perihilar airspace opacities bilaterally. There is chronic blunting of the left costophrenic angle with a possible small superimposed left pleural effusion. No evidence of pneumothorax. No acute osseous findings are demonstrated. Stable asymmetric glenohumeral degenerative changes on the right and a thoracolumbar scoliosis. IMPRESSION: New focal perihilar airspace opacities bilaterally, suspicious for pneumonia. Possible small left pleural effusion. Followup PA and lateral chest X-ray is recommended in 4-6 weeks following appropriate therapy to ensure resolution and exclude underlying malignancy. Electronically Signed   By: Elmon Hagedorn M.D.   On: 07/21/2023 08:21    Assessment/Plan  Weakness Due to recent  hospitalization, low bp, multiple co morbidities, dehydration Encourage fluid Held torsemide  today Recheck labs PT and OT  Hypertension Hypertension management complicated by recent hospitalization and medication adjustments. Current blood pressure 127/65 mmHg. Weakness may be due to hypotension and dehydration. - Reduce losartan  to 25 mg daily.  Congestive Heart Failure (diastolic) Managed with diuretics, BB, ARB. Current weight stable, no increased edema. Monitoring necessary due to recent hospitalization and elevated BUN. - Hold torsemide  today and resume at 20 mg tomorrow. - Monitor weight. - Advise low sodium diet. - Encourage leg elevation.  Chronic Kidney Disease  Worsening indicated by elevated BUN and creatinine levels. Baseline BUN and creatinine typically 38 and 1.0, current levels 51 and 1.3. Encourage oral fluid - Recheck BMP on Monday.  Chronic Steroid Use due COPD Recommend increasing to 5 mg as recommended by pulmonologist. Weakness may be partially due to reduction in prednisone .  Community Acquired Pneumonia Diagnosed during recent hospitalization, treated with antibiotics and steroids. Lungs sound improved, on room air with normal O2 saturation. - Order chest x-ray in four weeks.  Blindness due to glaucoma and macular degeneration  Chronic condition. No acute management changes.  Hearing Loss Significant hearing loss is a chronic condition. No acute management changes.  Pacemaker Due to SSS  Hx of DVT/PE afib Long term xarelto   Hypothyroidism Lab Results  Component Value Date   TSH 2.30 12/31/2022  Continue levothyroxine   ?Urinary retention Reports some low output and difficulty urinating at home Will check bladder scan  Goals of Care Discussed goals of care with her and her daughter. She has a DNR and a MOST form indicating no hospitalizations. She expressed desire not to return to the hospital but wants to be cared for and monitored  closely.  Follow-up Plans include monitoring lab values and symptoms to ensure stability and address any changes in condition. - Check CBC on Monday. - Monitor elevated white count.

## 2023-08-02 ENCOUNTER — Telehealth: Payer: Self-pay | Admitting: Adult Health

## 2023-08-02 MED ORDER — MORPHINE SULFATE (CONCENTRATE) 20 MG/ML PO SOLN: 5.0000 mg | Freq: Four times a day (QID) | ORAL | 0 refills | Status: DC | PRN

## 2023-08-02 NOTE — Telephone Encounter (Signed)
 Jody Blake is admitted to rehab after a bout of pna. She is weak. Multiple health problems. She has decided to forego medication and is refusing to eat. Her goals of care are comfort based with no hospitalizations. She has a DNR. I recommend a hospice consult. Due to her COPD and CHF if she stops meds she will likely get sob. Will add prn morphine.

## 2023-08-04 ENCOUNTER — Non-Acute Institutional Stay (SKILLED_NURSING_FACILITY): Payer: Self-pay | Admitting: Internal Medicine

## 2023-08-04 DIAGNOSIS — I5020 Unspecified systolic (congestive) heart failure: Secondary | ICD-10-CM | POA: Diagnosis not present

## 2023-08-04 DIAGNOSIS — H401131 Primary open-angle glaucoma, bilateral, mild stage: Secondary | ICD-10-CM | POA: Diagnosis not present

## 2023-08-04 DIAGNOSIS — I480 Paroxysmal atrial fibrillation: Secondary | ICD-10-CM

## 2023-08-04 DIAGNOSIS — J189 Pneumonia, unspecified organism: Secondary | ICD-10-CM | POA: Diagnosis not present

## 2023-08-04 DIAGNOSIS — J449 Chronic obstructive pulmonary disease, unspecified: Secondary | ICD-10-CM | POA: Diagnosis not present

## 2023-08-04 DIAGNOSIS — J410 Simple chronic bronchitis: Secondary | ICD-10-CM | POA: Diagnosis not present

## 2023-08-04 DIAGNOSIS — I10 Essential (primary) hypertension: Secondary | ICD-10-CM | POA: Diagnosis not present

## 2023-08-04 DIAGNOSIS — J9601 Acute respiratory failure with hypoxia: Secondary | ICD-10-CM | POA: Diagnosis not present

## 2023-08-04 DIAGNOSIS — I5032 Chronic diastolic (congestive) heart failure: Secondary | ICD-10-CM | POA: Diagnosis not present

## 2023-08-04 DIAGNOSIS — I509 Heart failure, unspecified: Secondary | ICD-10-CM | POA: Diagnosis not present

## 2023-08-04 DIAGNOSIS — Z950 Presence of cardiac pacemaker: Secondary | ICD-10-CM | POA: Diagnosis not present

## 2023-08-04 DIAGNOSIS — J159 Unspecified bacterial pneumonia: Secondary | ICD-10-CM | POA: Diagnosis not present

## 2023-08-04 DIAGNOSIS — I495 Sick sinus syndrome: Secondary | ICD-10-CM | POA: Diagnosis not present

## 2023-08-04 DIAGNOSIS — Z8701 Personal history of pneumonia (recurrent): Secondary | ICD-10-CM | POA: Diagnosis not present

## 2023-08-04 DIAGNOSIS — E039 Hypothyroidism, unspecified: Secondary | ICD-10-CM | POA: Diagnosis not present

## 2023-08-04 DIAGNOSIS — M6281 Muscle weakness (generalized): Secondary | ICD-10-CM | POA: Diagnosis not present

## 2023-08-04 DIAGNOSIS — K2100 Gastro-esophageal reflux disease with esophagitis, without bleeding: Secondary | ICD-10-CM | POA: Diagnosis not present

## 2023-08-04 NOTE — Progress Notes (Signed)
 Provider:   Location:  Medical illustrator of Service:  SNF ([id number])  PCP: Marguerite Shiley, MD Patient Care Team: Marguerite Shiley, MD as PCP - General (Internal Medicine) Hugh Madura, MD as PCP - Cardiology (Cardiology) Jolly Needle, MD (Inactive) as Consulting Physician (Cardiology) Faustina Hood, MD as Consulting Physician (Pulmonary Disease) Albert Huff, MD as Consulting Physician (Ophthalmology) Garrett Kallman, MD as Consulting Physician (Gastroenterology) Liliane Rei, MD as Consulting Physician (Orthopedic Surgery)  Extended Emergency Contact Information Primary Emergency Contact: Hunt,Cheryl  [place] Home Phone: [phone] Mobile Phone: [phone] Relation: Daughter Secondary Emergency Contact: Athena [place]  [place] Home Phone: [phone] Mobile Phone: [phone] Relation: Son  Code Status: DNR  Goals of Care: Advanced Directive information    07/21/2023    3:00 PM  Advanced Directives  Does Patient Have a Medical Advance Directive? Yes  Type of Estate agent of [place];Living will  Does patient want to make changes to medical advance directive? No - Patient declined  Copy of Healthcare Power of Attorney in Chart? Yes - validated most recent copy scanned in chart (See row information)      Chief Complaint  Patient presents with   Acute Visit    HPI: Patient is a 89 y.o. female seen today for admission to Rehab unit  Lives in [place] in [place] with her caregiver Now has 24/7 caregivers   Patient has h/o A. fib on Xarelto    s/p AV nodal ablation, s/p PPM in 2009 Aortic stenosis  COPD with frequent exacerbations.  Not on oxygen .  On chronic prednisone . Also is Legally blind and HOH   Admitted in the hospital from 4/14-4/20 for Pneumonia and COPD exacerbation and Hypoxia  Treated with Antibiotics and Prednisone  Discharged back home But she continues to be weak and  worsening Confusion Poor appetite Decided to enroll in hospice  Thsi morning she did not respond to me Per her caregiver has had only ice chips Seems Comfortable   Past Medical History:  Diagnosis Date   Anemia    Aortic stenosis    a. Echo 09/06/12 EF 55-60%, no WMA, G2DD, Ao valve sclerosis w/ mod stenosis, LA mildly dilated, PA pressure   Asthmatic bronchitis    Complete heart block (HCC)    s/p permanent pacemaker 06/27/1999 (Battery change 06/2007 and 2016).  s/p AV nodal ablation by Dr Nunzio Belch 2016.   COPD (chronic obstructive pulmonary disease) (HCC)    Dr. Linder Revere   DDD (degenerative disc disease)    Diastolic dysfunction    a. Echo 09/06/12 EF 55-60%, no WMA, G2DD, Ao valve sclerosis w/ mod stenosis, LA mildly dilated, PA pressure   Diverticulosis    DVT (deep vein thrombosis) in pregnancy 1954   a. LLE   Hypertension    Hypothyroidism    on medication   Macular degeneration    Dr. Alean Hunt   Osteoarthrosis, unspecified whether generalized or localized, other specified sites    PAF (paroxysmal atrial fibrillation) (HCC)    Stopped flecainide , on amiodarone but still has bouts of A FIB (mostly in mornings).    Pure hypercholesterolemia    Staghorn calculus    Left   Past Surgical History:  Procedure Laterality Date   APPENDECTOMY  ~ 1941   AV NODE ABLATION  05/10/2014   AV NODE ABLATION N/A 05/10/2014   Procedure: AV NODE ABLATION;  Surgeon: Jolly Needle, MD;  Location: [facility] CATH LAB;  Service:  Cardiovascular;  Laterality: N/A;   BUNIONECTOMY WITH HAMMERTOE RECONSTRUCTION Bilateral ~ 1990   CARDIAC PACEMAKER PLACEMENT  06/27/99   Medtronic PM implanted by Dr Dortha Gauss   CARDIOVERSION N/A 12/02/2013   Procedure: CARDIOVERSION;  Surgeon: Elmyra Haggard, MD;  Location: [facility] ENDOSCOPY;  Service: Cardiovascular;  Laterality: N/A;   CATARACT EXTRACTION W/ INTRAOCULAR LENS  IMPLANT, BILATERAL Bilateral    COLONOSCOPY WITH PROPOFOL  N/A 04/22/2013   Procedure: COLONOSCOPY WITH  PROPOFOL ;  Surgeon: Garrett Kallman, MD;  Location: [facility] ENDOSCOPY;  Service: Endoscopy;  Laterality: N/A;   DILATION AND CURETTAGE OF UTERUS  X 2   "when I was going thru menopause"   ESOPHAGOGASTRODUODENOSCOPY (EGD) WITH PROPOFOL  N/A 04/22/2013   Procedure: ESOPHAGOGASTRODUODENOSCOPY (EGD) WITH PROPOFOL ;  Surgeon: Garrett Kallman, MD;  Location: [facility] ENDOSCOPY;  Service: Endoscopy;  Laterality: N/A;   HERNIA REPAIR     INCISIONAL HERNIA REPAIR     INSERT / REPLACE / REMOVE PACEMAKER  06/2007   "took out the old; put in new"   INSERT / REPLACE / REMOVE PACEMAKER  05/10/2014   MDT PPM generator change by Dr Nunzio Belch   JOINT REPLACEMENT     PARTIAL NEPHRECTOMY Left 05/1974   stone disease   PERMANENT PACEMAKER GENERATOR CHANGE N/A 05/10/2014   Procedure: PERMANENT PACEMAKER GENERATOR CHANGE;  Surgeon: Jolly Needle, MD;  Location: [facility] CATH LAB;  Service: Cardiovascular;  Laterality: N/A;   TONSILLECTOMY AND ADENOIDECTOMY  1930's   TOTAL KNEE ARTHROPLASTY Right 2001    reports that she has never smoked. She has never used smokeless tobacco. She reports current alcohol use of about 7.0 standard drinks of alcohol per week. She reports that she does not use drugs. Social History   Socioeconomic History   Marital status: Widowed    Spouse name: Not on file   Number of children: 2   Years of education: Not on file   Highest education level: Not on file  Occupational History   Occupation: RETIRED    Employer: RETIRED    Comment: Family [employer] business  Tobacco Use   Smoking status: Never   Smokeless tobacco: Never  Vaping Use   Vaping status: Never Used  Substance and Sexual Activity   Alcohol use: Yes    Alcohol/week: 7.0 standard drinks of alcohol    Types: 7 Glasses of wine per week    Comment: occasionally   Drug use: No   Sexual activity: Not Currently    Partners: Male  Other Topics Concern   Not on file  Social History Narrative   Diet? Low salt, low fat      Do you drink/eat things  with caffeine? yes      Marital status?                 married                   What year were you married? 1949      Do you live in a house, apartment, assisted living, condo, trailer, etc.? apartment      Is it one or more stories? 3 stories      How many persons live in your home? Just me      Do you have any pets in your home? (please list) no      Current or past profession: accounting      Do you exercise?          yes  Type & how often? Walk, class, prescribed daily      Do you have a living will? yes      Do you have a DNR form?     yes                             If not, do you want to discuss one?      Do you have signed POA/HPOA for forms? no         Social Drivers of Corporate investment banker Strain: Low Risk  (02/16/2021)   Overall Financial Resource Strain (CARDIA)    Difficulty of Paying Living Expenses: Not hard at all  Food Insecurity: No Food Insecurity (07/21/2023)   Hunger Vital Sign    Worried About Running Out of Food in the Last Year: Never true    Ran Out of Food in the Last Year: Never true  Transportation Needs: No Transportation Needs (07/21/2023)   PRAPARE - Administrator, Civil Service (Medical): No    Lack of Transportation (Non-Medical): No  Physical Activity: Sufficiently Active (02/16/2021)   Exercise Vital Sign    Days of Exercise per Week: 7 days    Minutes of Exercise per Session: 30 min  Stress: No Stress Concern Present (02/16/2021)   [employer] of Occupational Health - Occupational Stress Questionnaire    Feeling of Stress : Not at all  Social Connections: Moderately Isolated (07/21/2023)   Social Connection and Isolation Panel [NHANES]    Frequency of Communication with Friends and Family: More than three times a week    Frequency of Social Gatherings with Friends and Family: More than three times a week    Attends Religious Services: Never    Database administrator or Organizations:  Yes    Attends Engineer, structural: More than 4 times per year    Marital Status: Widowed  Intimate Partner Violence: Not At Risk (07/21/2023)   Humiliation, Afraid, Rape, and Kick questionnaire    Fear of Current or Ex-Partner: No    Emotionally Abused: No    Physically Abused: No    Sexually Abused: No    Functional Status Survey:    Family History  Problem Relation Age of Onset   Malignant hypertension Father    Hypertension Father    Renal Disease Father    Breast cancer Mother    Heart attack Brother    Stroke Brother     Health Maintenance  Topic Date Due   COVID-19 Vaccine (8 - 2024-25 season) 01/07/2024 (Originally 03/11/2023)   INFLUENZA VACCINE  11/07/2023   Medicare Annual Wellness (AWV)  06/29/2024   DTaP/Tdap/Td (2 - Td or Tdap) 02/23/2029   Pneumonia Vaccine 8+ Years old  Completed   Zoster Vaccines- Shingrix  Completed   HPV VACCINES  Aged Out   Meningococcal B Vaccine  Aged Out   DEXA SCAN  Discontinued    Allergies  Allergen Reactions   Combigan [Brimonidine Tartrate-Timolol] Other (See Comments)    Systemic malaise amigen eye drop   Tape Other (See Comments)    SKIN IS VERY FRAGILE!!   Breo Ellipta  [Fluticasone  Furoate-Vilanterol] Other (See Comments) and Hypertension    Increased blood pressure   Penicillins Hives   Vancomycin  Other (See Comments)    Red man syndrome - Mild redness and discomfort. Able to complete initial dose.    Fluticasone  Other (See Comments)  Reaction type/severity unknown   Other     SEASONAL ALLERGIES   Timolol Other (See Comments)    Reaction type/severity unknown     Outpatient Encounter Medications as of 08/04/2023  Medication Sig   albuterol  (VENTOLIN  HFA) 108 (90 Base) MCG/ACT inhaler Inhale 2 puffs into the lungs every 6 (six) hours as needed for wheezing or shortness of breath.   Azelastine  HCl 137 MCG/SPRAY SOLN USE 1 TO 2 SPRAYS INTO BOTH NOSTRILS TWICE DAILY.   Calcium  600-10 MG-MCG CHEW Chew 1  capsule by mouth daily.   Cholecalciferol  (VITAMIN D ) 125 MCG (5000 UT) CAPS Take 1 capsule by mouth daily.   dorzolamide  (TRUSOPT ) 2 % ophthalmic solution Place 1 drop into both eyes 2 (two) times daily.   famotidine  (PEPCID ) 40 MG tablet Take 1 tablet (40 mg total) by mouth at bedtime.   fexofenadine  (ALLEGRA ) 180 MG tablet Take 180 mg by mouth daily.   fluticasone  (FLONASE ) 50 MCG/ACT nasal spray USE 2 SPRAYS EACH NOSTRIL ONCE A DAY AS NEEDED FOR ALLERGIES OR CONGESTION.   gabapentin  (NEURONTIN ) 100 MG capsule Take 200 mg by mouth at bedtime.   guaiFENesin  (MUCINEX ) 600 MG 12 hr tablet Take 600 mg by mouth daily. And prn   ipratropium-albuterol  (DUONEB) 0.5-2.5 (3) MG/3ML SOLN Take 3 mLs by nebulization at bedtime.   ipratropium-albuterol  (DUONEB) 0.5-2.5 (3) MG/3ML SOLN Take 3 mLs by nebulization every 6 (six) hours as needed.   latanoprost  (XALATAN ) 0.005 % ophthalmic solution Place 1 drop into both eyes at bedtime.   levothyroxine  (SYNTHROID ) 75 MCG tablet Take 1 tablet (75 mcg total) by mouth daily.   losartan  (COZAAR ) 25 MG tablet Take 1 tablet (25 mg total) by mouth daily.   Lutein  20 MG CAPS Take 1 capsule (20 mg total) by mouth daily.   Magnesium  500 MG TABS Take 750 mg by mouth See admin instructions. Take 750 mg by mouth every evening (in conjunction with 3 BioComplete capsules)   metoprolol  tartrate (LOPRESSOR ) 50 MG tablet Take 1.5 tablets (75 mg total) by mouth in the morning.   mometasone -formoterol  (DULERA ) 100-5 MCG/ACT AERO Inhale 2 puffs then rinse mouth, twice daily   montelukast  (SINGULAIR ) 10 MG tablet Take 1 tablet (10 mg total) by mouth daily.   morphine  (ROXANOL) 20 MG/ML concentrated solution Take 0.25 mLs (5 mg total) by mouth every 6 (six) hours as needed for severe pain (pain score 7-10).   Multiple Vitamins-Minerals (PRESERVISION AREDS 2) CAPS Take 1 capsule by mouth in the morning and at bedtime.   NON FORMULARY Take 3 capsules by mouth See admin instructions.  Gundry MD Bio Complete 3 - Prebiotic, Probiotic, Postbiotic to Support Optimal Gut Health capsules- TAKE 3 CAPSULES BY MOUTH EVERY EVENING (in conjunction with 750 mg Magnesium )   potassium chloride  (KLOR-CON ) 10 MEQ tablet Take 2 tablets (20 mEq total) by mouth daily. (Patient taking differently: Take 10-20 mEq by mouth 2 (two) times daily. Take 20mEq (2 tablets) in the morning and 10mEq (1 tablet) with lunch for a total daily dose of 30mEq)   predniSONE  (DELTASONE ) 5 MG tablet Take 1 tablet (5 mg total) by mouth daily with breakfast.   Rivaroxaban  (XARELTO ) 15 MG TABS tablet Take 15 mg by mouth daily.   torsemide  (DEMADEX ) 20 MG tablet Take 1 tablet (20 mg total) by mouth daily as needed. 3 lb weight gain in 1 day or 5 lb in 1 week or excessive edema   torsemide  (DEMADEX ) 20 MG tablet Take 1 tablet (20  mg total) by mouth daily.   TYLENOL  500 MG tablet Take 1,000 mg by mouth in the morning, at noon, and at bedtime.   No facility-administered encounter medications on file as of 08/04/2023.    Review of Systems  Unable to perform ROS: Patient unresponsive    There were no vitals filed for this visit. There is no height or weight on file to calculate BMI. Physical Exam Vitals reviewed.  Constitutional:      Appearance: Normal appearance.  HENT:     Head: Normocephalic.     Nose: Nose normal.     Mouth/Throat:     Mouth: Mucous membranes are moist.     Pharynx: Oropharynx is clear.  Eyes:     Pupils: Pupils are equal, round, and reactive to light.  Cardiovascular:     Rate and Rhythm: Normal rate and regular rhythm.     Pulses: Normal pulses.     Heart sounds: Normal heart sounds. No murmur heard. Pulmonary:     Effort: Pulmonary effort is normal.     Breath sounds: Normal breath sounds.  Abdominal:     General: Abdomen is flat. Bowel sounds are normal.     Palpations: Abdomen is soft.  Musculoskeletal:        General: No swelling.     Cervical back: Neck supple.  Skin:     General: Skin is warm.  Neurological:     General: No focal deficit present.  Psychiatric:        Mood and Affect: Mood normal.        Thought Content: Thought content normal.     Labs reviewed: Basic Metabolic Panel: Recent Labs    07/23/23 0411 07/24/23 0435 07/25/23 0413 07/26/23 0410 07/27/23 0329  NA 141   < > 141 141 141  K 3.4*   < > 4.1 3.5 3.5  CL 104   < > 102 103 102  CO2 26   < > 27 29 28   GLUCOSE 179*   < > 187* 175* 185*  BUN 36*   < > 38* 38* 40*  CREATININE 1.08*   < > 0.91 1.00 1.07*  CALCIUM  9.4   < > 9.2 9.1 8.6*  MG 2.5*   < > 2.6* 2.7* 2.6*  PHOS 2.1*  --   --   --   --    < > = values in this interval not displayed.   Liver Function Tests: Recent Labs    07/21/23 0924  AST 20  ALT 13  ALKPHOS 64  BILITOT 0.8  PROT 7.4  ALBUMIN 3.5   No results for input(s): "LIPASE", "AMYLASE" in the last 8760 hours. No results for input(s): "AMMONIA" in the last 8760 hours. CBC: Recent Labs    05/12/23 1435 07/21/23 0924 07/25/23 0413 07/26/23 0410 07/27/23 0329  WBC 11.2*   < > 12.9* 11.4* 13.3*  NEUTROABS 9,733*  --   --   --   --   HGB 12.2   < > 11.5* 10.4* 10.3*  HCT 37.9   < > 39.7 34.4* 34.3*  MCV 95.0   < > 102.1* 98.9 99.1  PLT 432*   < > 399 434* 404*   < > = values in this interval not displayed.   Cardiac Enzymes: No results for input(s): "CKTOTAL", "CKMB", "CKMBINDEX", "TROPONINI" in the last 8760 hours. BNP: Invalid input(s): "POCBNP" Lab Results  Component Value Date   HGBA1C 5.7 (H) 06/25/2017   Lab Results  Component Value Date   TSH 2.30 12/31/2022   Lab Results  Component Value Date   VITAMINB12 450 03/21/2022   VITAMINB12 450 03/21/2022   Lab Results  Component Value Date   FOLATE 18.2 04/13/2021   Lab Results  Component Value Date   IRON 36 04/13/2021   TIBC 196 (L) 04/13/2021   FERRITIN 269 04/13/2021    Imaging and Procedures obtained prior to SNF admission: DG Chest Port 1 View Result Date:  07/21/2023 CLINICAL DATA:  Shortness of breath with wheezing and productive cough. EXAM: PORTABLE CHEST 1 VIEW COMPARISON:  Radiographs 06/24/2022 and 04/08/2022.  CT 07/07/2022. FINDINGS: 0805 hours. Left subclavian pacemaker leads appear unchanged, projecting over the right atrium and right ventricle. Stable mild cardiac enlargement and aortic atherosclerosis. There are lower lung volumes with new focal perihilar airspace opacities bilaterally. There is chronic blunting of the left costophrenic angle with a possible small superimposed left pleural effusion. No evidence of pneumothorax. No acute osseous findings are demonstrated. Stable asymmetric glenohumeral degenerative changes on the right and a thoracolumbar scoliosis. IMPRESSION: New focal perihilar airspace opacities bilaterally, suspicious for pneumonia. Possible small left pleural effusion. Followup PA and lateral chest X-ray is recommended in 4-6 weeks following appropriate therapy to ensure resolution and exclude underlying malignancy. Electronically Signed   By: Elmon Hagedorn M.D.   On: 07/21/2023 08:21    Assessment/Plan Patient with COPD, CHF, Legally Blind and PAF and recent Pneumonia Now enrolled in Hospice I have stopped all her meds and started her on Ativan and Morphine  She will be Admitted to Hospice care today    Family/ staff Communication:   Labs/tests ordered:

## 2023-08-05 ENCOUNTER — Encounter: Admitting: Internal Medicine

## 2023-08-05 DIAGNOSIS — I10 Essential (primary) hypertension: Secondary | ICD-10-CM | POA: Diagnosis not present

## 2023-08-05 DIAGNOSIS — J449 Chronic obstructive pulmonary disease, unspecified: Secondary | ICD-10-CM | POA: Diagnosis not present

## 2023-08-05 DIAGNOSIS — I5032 Chronic diastolic (congestive) heart failure: Secondary | ICD-10-CM | POA: Diagnosis not present

## 2023-08-05 DIAGNOSIS — I495 Sick sinus syndrome: Secondary | ICD-10-CM | POA: Diagnosis not present

## 2023-08-05 DIAGNOSIS — Z950 Presence of cardiac pacemaker: Secondary | ICD-10-CM | POA: Diagnosis not present

## 2023-08-05 DIAGNOSIS — I480 Paroxysmal atrial fibrillation: Secondary | ICD-10-CM | POA: Diagnosis not present

## 2023-08-06 DIAGNOSIS — J449 Chronic obstructive pulmonary disease, unspecified: Secondary | ICD-10-CM | POA: Diagnosis not present

## 2023-08-06 DIAGNOSIS — I10 Essential (primary) hypertension: Secondary | ICD-10-CM | POA: Diagnosis not present

## 2023-08-06 DIAGNOSIS — I495 Sick sinus syndrome: Secondary | ICD-10-CM | POA: Diagnosis not present

## 2023-08-06 DIAGNOSIS — I480 Paroxysmal atrial fibrillation: Secondary | ICD-10-CM | POA: Diagnosis not present

## 2023-08-06 DIAGNOSIS — Z950 Presence of cardiac pacemaker: Secondary | ICD-10-CM | POA: Diagnosis not present

## 2023-08-06 DIAGNOSIS — I5032 Chronic diastolic (congestive) heart failure: Secondary | ICD-10-CM | POA: Diagnosis not present

## 2023-08-07 DIAGNOSIS — E039 Hypothyroidism, unspecified: Secondary | ICD-10-CM | POA: Diagnosis not present

## 2023-08-07 DIAGNOSIS — I480 Paroxysmal atrial fibrillation: Secondary | ICD-10-CM | POA: Diagnosis not present

## 2023-08-07 DIAGNOSIS — J9601 Acute respiratory failure with hypoxia: Secondary | ICD-10-CM | POA: Diagnosis not present

## 2023-08-07 DIAGNOSIS — I10 Essential (primary) hypertension: Secondary | ICD-10-CM | POA: Diagnosis not present

## 2023-08-07 DIAGNOSIS — H401131 Primary open-angle glaucoma, bilateral, mild stage: Secondary | ICD-10-CM | POA: Diagnosis not present

## 2023-08-07 DIAGNOSIS — K2100 Gastro-esophageal reflux disease with esophagitis, without bleeding: Secondary | ICD-10-CM | POA: Diagnosis not present

## 2023-08-07 DIAGNOSIS — Z950 Presence of cardiac pacemaker: Secondary | ICD-10-CM | POA: Diagnosis not present

## 2023-08-07 DIAGNOSIS — I495 Sick sinus syndrome: Secondary | ICD-10-CM | POA: Diagnosis not present

## 2023-08-07 DIAGNOSIS — I5032 Chronic diastolic (congestive) heart failure: Secondary | ICD-10-CM | POA: Diagnosis not present

## 2023-08-07 DIAGNOSIS — J159 Unspecified bacterial pneumonia: Secondary | ICD-10-CM | POA: Diagnosis not present

## 2023-08-07 DIAGNOSIS — J449 Chronic obstructive pulmonary disease, unspecified: Secondary | ICD-10-CM | POA: Diagnosis not present

## 2023-08-07 DIAGNOSIS — I5020 Unspecified systolic (congestive) heart failure: Secondary | ICD-10-CM | POA: Diagnosis not present

## 2023-08-08 ENCOUNTER — Encounter: Payer: Self-pay | Admitting: Internal Medicine

## 2023-08-08 DIAGNOSIS — Z950 Presence of cardiac pacemaker: Secondary | ICD-10-CM | POA: Diagnosis not present

## 2023-08-08 DIAGNOSIS — I480 Paroxysmal atrial fibrillation: Secondary | ICD-10-CM | POA: Diagnosis not present

## 2023-08-08 DIAGNOSIS — I495 Sick sinus syndrome: Secondary | ICD-10-CM | POA: Diagnosis not present

## 2023-08-08 DIAGNOSIS — J449 Chronic obstructive pulmonary disease, unspecified: Secondary | ICD-10-CM | POA: Diagnosis not present

## 2023-08-08 DIAGNOSIS — I10 Essential (primary) hypertension: Secondary | ICD-10-CM | POA: Diagnosis not present

## 2023-08-08 DIAGNOSIS — I5032 Chronic diastolic (congestive) heart failure: Secondary | ICD-10-CM | POA: Diagnosis not present

## 2023-08-09 DIAGNOSIS — Z950 Presence of cardiac pacemaker: Secondary | ICD-10-CM | POA: Diagnosis not present

## 2023-08-09 DIAGNOSIS — I10 Essential (primary) hypertension: Secondary | ICD-10-CM | POA: Diagnosis not present

## 2023-08-09 DIAGNOSIS — J449 Chronic obstructive pulmonary disease, unspecified: Secondary | ICD-10-CM | POA: Diagnosis not present

## 2023-08-09 DIAGNOSIS — I480 Paroxysmal atrial fibrillation: Secondary | ICD-10-CM | POA: Diagnosis not present

## 2023-08-09 DIAGNOSIS — I495 Sick sinus syndrome: Secondary | ICD-10-CM | POA: Diagnosis not present

## 2023-08-09 DIAGNOSIS — I5032 Chronic diastolic (congestive) heart failure: Secondary | ICD-10-CM | POA: Diagnosis not present

## 2023-08-10 DIAGNOSIS — Z950 Presence of cardiac pacemaker: Secondary | ICD-10-CM | POA: Diagnosis not present

## 2023-08-10 DIAGNOSIS — I495 Sick sinus syndrome: Secondary | ICD-10-CM | POA: Diagnosis not present

## 2023-08-10 DIAGNOSIS — J449 Chronic obstructive pulmonary disease, unspecified: Secondary | ICD-10-CM | POA: Diagnosis not present

## 2023-08-10 DIAGNOSIS — I5032 Chronic diastolic (congestive) heart failure: Secondary | ICD-10-CM | POA: Diagnosis not present

## 2023-08-10 DIAGNOSIS — I480 Paroxysmal atrial fibrillation: Secondary | ICD-10-CM | POA: Diagnosis not present

## 2023-08-10 DIAGNOSIS — I10 Essential (primary) hypertension: Secondary | ICD-10-CM | POA: Diagnosis not present

## 2023-08-11 ENCOUNTER — Encounter: Payer: Medicare Other | Admitting: Adult Health

## 2023-08-11 ENCOUNTER — Telehealth: Payer: Self-pay | Admitting: Internal Medicine

## 2023-08-11 DIAGNOSIS — I10 Essential (primary) hypertension: Secondary | ICD-10-CM | POA: Diagnosis not present

## 2023-08-11 DIAGNOSIS — J449 Chronic obstructive pulmonary disease, unspecified: Secondary | ICD-10-CM | POA: Diagnosis not present

## 2023-08-11 DIAGNOSIS — I480 Paroxysmal atrial fibrillation: Secondary | ICD-10-CM | POA: Diagnosis not present

## 2023-08-11 DIAGNOSIS — Z950 Presence of cardiac pacemaker: Secondary | ICD-10-CM | POA: Diagnosis not present

## 2023-08-11 DIAGNOSIS — I5032 Chronic diastolic (congestive) heart failure: Secondary | ICD-10-CM | POA: Diagnosis not present

## 2023-08-11 DIAGNOSIS — I495 Sick sinus syndrome: Secondary | ICD-10-CM | POA: Diagnosis not present

## 2023-08-11 NOTE — Telephone Encounter (Signed)
 Robin, patient's caregiver called to cancel her remote checks.  She states Ms. Moorman is on hospice care.    Recall has also been deleted.

## 2023-08-11 NOTE — Telephone Encounter (Signed)
 I spoke with Jody Blake and she let me know that the pt is at end of life hospice care. She has days left. I canceled all upcoming remotes and marked her inactive in [facility]. I took her out of [facility]. I also ordered the pt a return kit for her home remote monitor.

## 2023-08-11 NOTE — Addendum Note (Signed)
 Addended by: Lott Rouleau A on: 08/11/2023 11:47 AM   Modules accepted: Orders

## 2023-08-11 NOTE — Progress Notes (Signed)
 Remote pacemaker transmission.

## 2023-08-12 DIAGNOSIS — I480 Paroxysmal atrial fibrillation: Secondary | ICD-10-CM | POA: Diagnosis not present

## 2023-08-12 DIAGNOSIS — I10 Essential (primary) hypertension: Secondary | ICD-10-CM | POA: Diagnosis not present

## 2023-08-12 DIAGNOSIS — J449 Chronic obstructive pulmonary disease, unspecified: Secondary | ICD-10-CM | POA: Diagnosis not present

## 2023-08-12 DIAGNOSIS — I495 Sick sinus syndrome: Secondary | ICD-10-CM | POA: Diagnosis not present

## 2023-08-12 DIAGNOSIS — Z950 Presence of cardiac pacemaker: Secondary | ICD-10-CM | POA: Diagnosis not present

## 2023-08-12 DIAGNOSIS — I5032 Chronic diastolic (congestive) heart failure: Secondary | ICD-10-CM | POA: Diagnosis not present

## 2023-09-07 DEATH — deceased

## 2023-09-15 ENCOUNTER — Encounter: Admitting: Pulmonary Disease

## 2023-09-23 ENCOUNTER — Encounter

## 2023-11-06 ENCOUNTER — Ambulatory Visit: Payer: Medicare Other | Admitting: Internal Medicine

## 2023-12-23 ENCOUNTER — Encounter

## 2023-12-28 IMAGING — DX DG CHEST 2V
2 series · 2 of 2 positions shown · non-contrast
Comparison: April 14, 2021

CLINICAL DATA: Follow-up pneumonia.

EXAM:
CHEST - 2 VIEW

[chest pa]
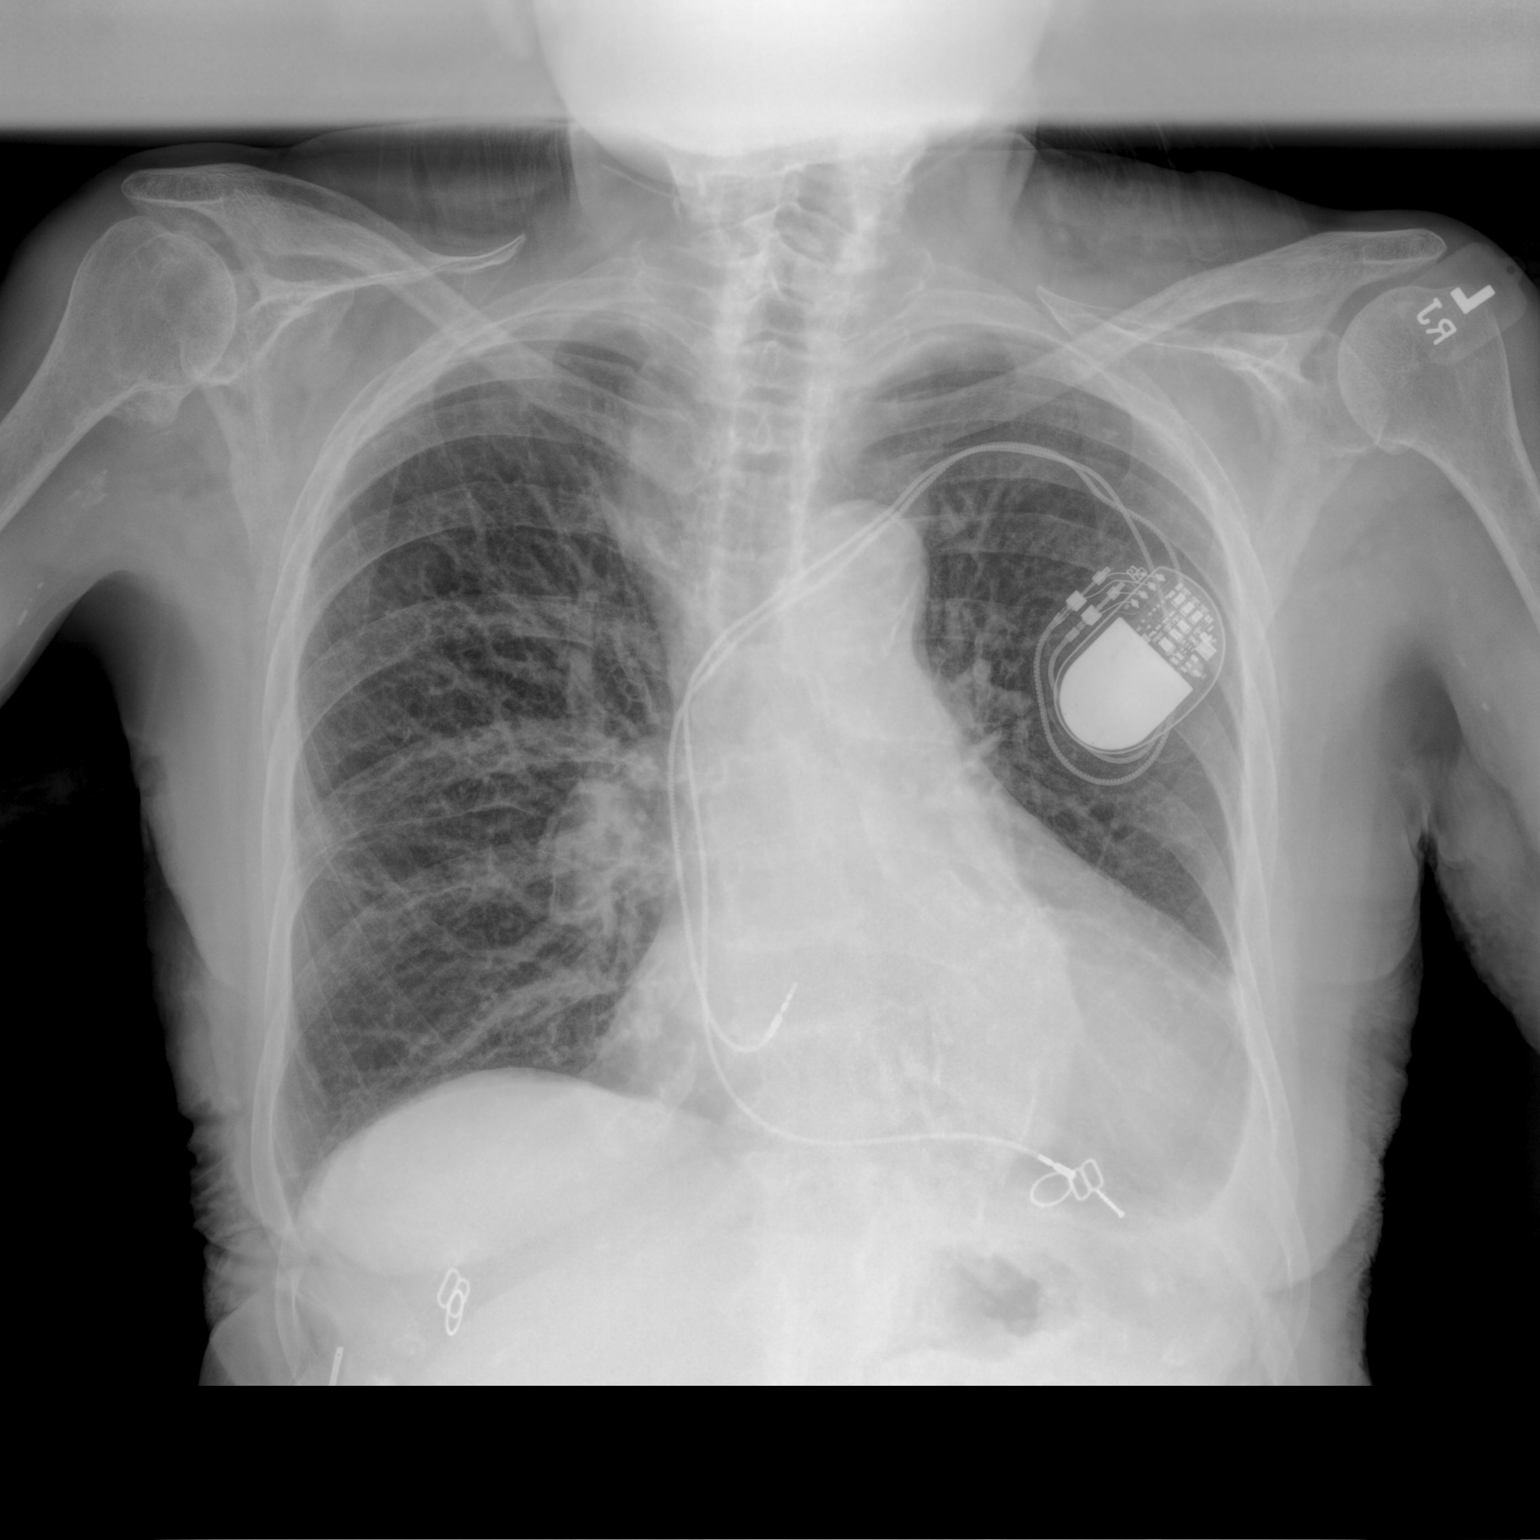

[chest lat]
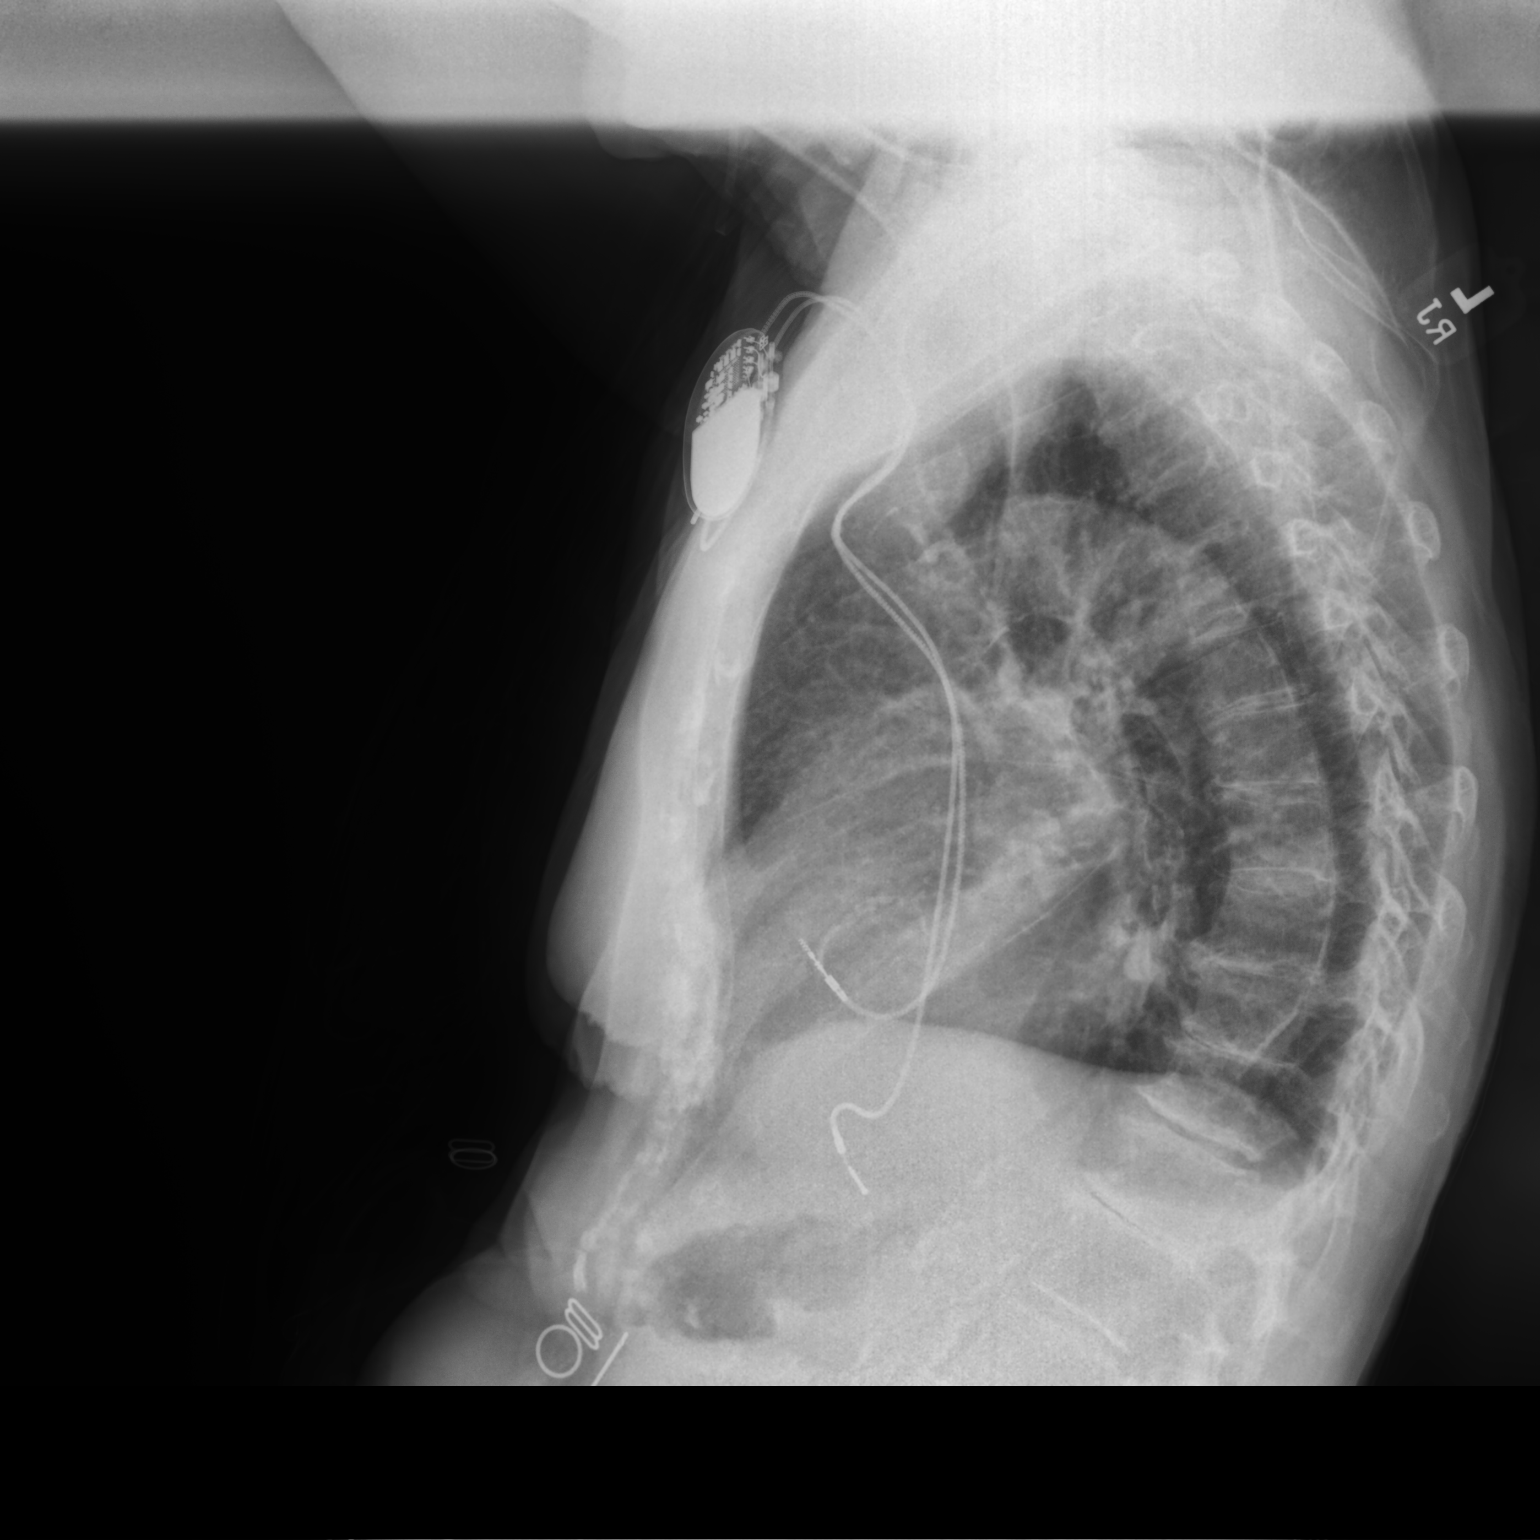

[2 of 2 positions shown; findings below may reference images not displayed]

FINDINGS: There is a dual lead AICD. Diffuse, chronic appearing increased
interstitial lung markings are noted. There is a small left pleural
effusion. No pneumothorax is identified. The cardiac silhouette is
mildly enlarged and unchanged in size. There is marked severity
calcification and tortuosity of the thoracic aorta the visualized
skeletal structures are unremarkable.
IMPRESSION: 1. Diffuse, chronic appearing increased interstitial lung markings.
2. Small left pleural effusion.

## 2024-03-23 ENCOUNTER — Encounter

## 2024-06-22 ENCOUNTER — Encounter

## 2024-09-21 ENCOUNTER — Encounter

## 2024-12-21 ENCOUNTER — Encounter
